# Patient Record
Sex: Female | Born: 1975
Health system: Southern US, Community
[De-identification: ages and names within clinical notes are randomized; demographics above are authoritative.]

## PROBLEM LIST (undated history)

## (undated) DIAGNOSIS — H547 Unspecified visual loss: Secondary | ICD-10-CM

## (undated) DIAGNOSIS — D649 Anemia, unspecified: Secondary | ICD-10-CM

## (undated) DIAGNOSIS — I739 Peripheral vascular disease, unspecified: Secondary | ICD-10-CM

## (undated) DIAGNOSIS — Z89432 Acquired absence of left foot: Secondary | ICD-10-CM

## (undated) DIAGNOSIS — I10 Essential (primary) hypertension: Secondary | ICD-10-CM

## (undated) DIAGNOSIS — Z720 Tobacco use: Secondary | ICD-10-CM

## (undated) DIAGNOSIS — L97509 Non-pressure chronic ulcer of other part of unspecified foot with unspecified severity: Secondary | ICD-10-CM

## (undated) DIAGNOSIS — N186 End stage renal disease: Secondary | ICD-10-CM

## (undated) DIAGNOSIS — Z89431 Acquired absence of right foot: Secondary | ICD-10-CM

## (undated) HISTORY — PX: OTHER SURGICAL HISTORY: SHX169

## (undated) HISTORY — PX: ABDOMINAL HYSTERECTOMY: SHX81

---

## 2016-12-28 DIAGNOSIS — N186 End stage renal disease: Secondary | ICD-10-CM | POA: Diagnosis not present

## 2016-12-28 DIAGNOSIS — N2581 Secondary hyperparathyroidism of renal origin: Secondary | ICD-10-CM | POA: Diagnosis not present

## 2016-12-28 DIAGNOSIS — D631 Anemia in chronic kidney disease: Secondary | ICD-10-CM | POA: Diagnosis not present

## 2017-01-02 DIAGNOSIS — N2581 Secondary hyperparathyroidism of renal origin: Secondary | ICD-10-CM | POA: Diagnosis not present

## 2017-01-02 DIAGNOSIS — D631 Anemia in chronic kidney disease: Secondary | ICD-10-CM | POA: Diagnosis not present

## 2017-01-02 DIAGNOSIS — N186 End stage renal disease: Secondary | ICD-10-CM | POA: Diagnosis not present

## 2017-01-04 DIAGNOSIS — D631 Anemia in chronic kidney disease: Secondary | ICD-10-CM | POA: Diagnosis not present

## 2017-01-04 DIAGNOSIS — N186 End stage renal disease: Secondary | ICD-10-CM | POA: Diagnosis not present

## 2017-01-04 DIAGNOSIS — N2581 Secondary hyperparathyroidism of renal origin: Secondary | ICD-10-CM | POA: Diagnosis not present

## 2017-01-06 DIAGNOSIS — D631 Anemia in chronic kidney disease: Secondary | ICD-10-CM | POA: Diagnosis not present

## 2017-01-06 DIAGNOSIS — N2581 Secondary hyperparathyroidism of renal origin: Secondary | ICD-10-CM | POA: Diagnosis not present

## 2017-01-06 DIAGNOSIS — N186 End stage renal disease: Secondary | ICD-10-CM | POA: Diagnosis not present

## 2017-01-09 DIAGNOSIS — N186 End stage renal disease: Secondary | ICD-10-CM | POA: Diagnosis not present

## 2017-01-09 DIAGNOSIS — D631 Anemia in chronic kidney disease: Secondary | ICD-10-CM | POA: Diagnosis not present

## 2017-01-09 DIAGNOSIS — N2581 Secondary hyperparathyroidism of renal origin: Secondary | ICD-10-CM | POA: Diagnosis not present

## 2017-01-13 DIAGNOSIS — N2581 Secondary hyperparathyroidism of renal origin: Secondary | ICD-10-CM | POA: Diagnosis not present

## 2017-01-13 DIAGNOSIS — D631 Anemia in chronic kidney disease: Secondary | ICD-10-CM | POA: Diagnosis not present

## 2017-01-13 DIAGNOSIS — N186 End stage renal disease: Secondary | ICD-10-CM | POA: Diagnosis not present

## 2017-01-16 DIAGNOSIS — N186 End stage renal disease: Secondary | ICD-10-CM | POA: Diagnosis not present

## 2017-01-16 DIAGNOSIS — N2581 Secondary hyperparathyroidism of renal origin: Secondary | ICD-10-CM | POA: Diagnosis not present

## 2017-01-16 DIAGNOSIS — D631 Anemia in chronic kidney disease: Secondary | ICD-10-CM | POA: Diagnosis not present

## 2017-01-18 DIAGNOSIS — D631 Anemia in chronic kidney disease: Secondary | ICD-10-CM | POA: Diagnosis not present

## 2017-01-18 DIAGNOSIS — N186 End stage renal disease: Secondary | ICD-10-CM | POA: Diagnosis not present

## 2017-01-18 DIAGNOSIS — N2581 Secondary hyperparathyroidism of renal origin: Secondary | ICD-10-CM | POA: Diagnosis not present

## 2017-01-20 DIAGNOSIS — N2581 Secondary hyperparathyroidism of renal origin: Secondary | ICD-10-CM | POA: Diagnosis not present

## 2017-01-20 DIAGNOSIS — N186 End stage renal disease: Secondary | ICD-10-CM | POA: Diagnosis not present

## 2017-01-20 DIAGNOSIS — D631 Anemia in chronic kidney disease: Secondary | ICD-10-CM | POA: Diagnosis not present

## 2017-01-23 DIAGNOSIS — N186 End stage renal disease: Secondary | ICD-10-CM | POA: Diagnosis not present

## 2017-01-23 DIAGNOSIS — D631 Anemia in chronic kidney disease: Secondary | ICD-10-CM | POA: Diagnosis not present

## 2017-01-23 DIAGNOSIS — N2581 Secondary hyperparathyroidism of renal origin: Secondary | ICD-10-CM | POA: Diagnosis not present

## 2017-01-25 DIAGNOSIS — Z992 Dependence on renal dialysis: Secondary | ICD-10-CM | POA: Diagnosis not present

## 2017-01-25 DIAGNOSIS — N2581 Secondary hyperparathyroidism of renal origin: Secondary | ICD-10-CM | POA: Diagnosis not present

## 2017-01-25 DIAGNOSIS — D631 Anemia in chronic kidney disease: Secondary | ICD-10-CM | POA: Diagnosis not present

## 2017-01-25 DIAGNOSIS — N186 End stage renal disease: Secondary | ICD-10-CM | POA: Diagnosis not present

## 2017-01-30 DIAGNOSIS — N2581 Secondary hyperparathyroidism of renal origin: Secondary | ICD-10-CM | POA: Diagnosis not present

## 2017-01-30 DIAGNOSIS — D631 Anemia in chronic kidney disease: Secondary | ICD-10-CM | POA: Diagnosis not present

## 2017-01-30 DIAGNOSIS — N186 End stage renal disease: Secondary | ICD-10-CM | POA: Diagnosis not present

## 2017-02-01 DIAGNOSIS — D631 Anemia in chronic kidney disease: Secondary | ICD-10-CM | POA: Diagnosis not present

## 2017-02-01 DIAGNOSIS — N186 End stage renal disease: Secondary | ICD-10-CM | POA: Diagnosis not present

## 2017-02-01 DIAGNOSIS — N2581 Secondary hyperparathyroidism of renal origin: Secondary | ICD-10-CM | POA: Diagnosis not present

## 2017-02-03 DIAGNOSIS — N186 End stage renal disease: Secondary | ICD-10-CM | POA: Diagnosis not present

## 2017-02-03 DIAGNOSIS — D631 Anemia in chronic kidney disease: Secondary | ICD-10-CM | POA: Diagnosis not present

## 2017-02-03 DIAGNOSIS — N2581 Secondary hyperparathyroidism of renal origin: Secondary | ICD-10-CM | POA: Diagnosis not present

## 2017-02-06 DIAGNOSIS — N2581 Secondary hyperparathyroidism of renal origin: Secondary | ICD-10-CM | POA: Diagnosis not present

## 2017-02-06 DIAGNOSIS — D631 Anemia in chronic kidney disease: Secondary | ICD-10-CM | POA: Diagnosis not present

## 2017-02-06 DIAGNOSIS — N186 End stage renal disease: Secondary | ICD-10-CM | POA: Diagnosis not present

## 2017-02-08 DIAGNOSIS — N186 End stage renal disease: Secondary | ICD-10-CM | POA: Diagnosis not present

## 2017-02-08 DIAGNOSIS — N2581 Secondary hyperparathyroidism of renal origin: Secondary | ICD-10-CM | POA: Diagnosis not present

## 2017-02-08 DIAGNOSIS — D631 Anemia in chronic kidney disease: Secondary | ICD-10-CM | POA: Diagnosis not present

## 2017-02-10 DIAGNOSIS — N186 End stage renal disease: Secondary | ICD-10-CM | POA: Diagnosis not present

## 2017-02-10 DIAGNOSIS — N2581 Secondary hyperparathyroidism of renal origin: Secondary | ICD-10-CM | POA: Diagnosis not present

## 2017-02-10 DIAGNOSIS — D631 Anemia in chronic kidney disease: Secondary | ICD-10-CM | POA: Diagnosis not present

## 2017-02-13 DIAGNOSIS — D631 Anemia in chronic kidney disease: Secondary | ICD-10-CM | POA: Diagnosis not present

## 2017-02-13 DIAGNOSIS — N2581 Secondary hyperparathyroidism of renal origin: Secondary | ICD-10-CM | POA: Diagnosis not present

## 2017-02-13 DIAGNOSIS — N186 End stage renal disease: Secondary | ICD-10-CM | POA: Diagnosis not present

## 2017-02-15 DIAGNOSIS — N186 End stage renal disease: Secondary | ICD-10-CM | POA: Diagnosis not present

## 2017-02-15 DIAGNOSIS — D631 Anemia in chronic kidney disease: Secondary | ICD-10-CM | POA: Diagnosis not present

## 2017-02-15 DIAGNOSIS — N2581 Secondary hyperparathyroidism of renal origin: Secondary | ICD-10-CM | POA: Diagnosis not present

## 2017-02-17 DIAGNOSIS — N2581 Secondary hyperparathyroidism of renal origin: Secondary | ICD-10-CM | POA: Diagnosis not present

## 2017-02-17 DIAGNOSIS — N186 End stage renal disease: Secondary | ICD-10-CM | POA: Diagnosis not present

## 2017-02-17 DIAGNOSIS — D631 Anemia in chronic kidney disease: Secondary | ICD-10-CM | POA: Diagnosis not present

## 2017-02-20 DIAGNOSIS — D631 Anemia in chronic kidney disease: Secondary | ICD-10-CM | POA: Diagnosis not present

## 2017-02-20 DIAGNOSIS — N186 End stage renal disease: Secondary | ICD-10-CM | POA: Diagnosis not present

## 2017-02-20 DIAGNOSIS — N2581 Secondary hyperparathyroidism of renal origin: Secondary | ICD-10-CM | POA: Diagnosis not present

## 2017-02-22 DIAGNOSIS — N186 End stage renal disease: Secondary | ICD-10-CM | POA: Diagnosis not present

## 2017-02-22 DIAGNOSIS — N2581 Secondary hyperparathyroidism of renal origin: Secondary | ICD-10-CM | POA: Diagnosis not present

## 2017-02-22 DIAGNOSIS — D631 Anemia in chronic kidney disease: Secondary | ICD-10-CM | POA: Diagnosis not present

## 2017-02-22 DIAGNOSIS — Z992 Dependence on renal dialysis: Secondary | ICD-10-CM | POA: Diagnosis not present

## 2017-02-24 DIAGNOSIS — N2581 Secondary hyperparathyroidism of renal origin: Secondary | ICD-10-CM | POA: Diagnosis not present

## 2017-02-24 DIAGNOSIS — D631 Anemia in chronic kidney disease: Secondary | ICD-10-CM | POA: Diagnosis not present

## 2017-02-24 DIAGNOSIS — N186 End stage renal disease: Secondary | ICD-10-CM | POA: Diagnosis not present

## 2017-02-24 DIAGNOSIS — D508 Other iron deficiency anemias: Secondary | ICD-10-CM | POA: Diagnosis not present

## 2017-02-24 DIAGNOSIS — E873 Alkalosis: Secondary | ICD-10-CM | POA: Diagnosis not present

## 2017-03-01 DIAGNOSIS — D631 Anemia in chronic kidney disease: Secondary | ICD-10-CM | POA: Diagnosis not present

## 2017-03-01 DIAGNOSIS — N2581 Secondary hyperparathyroidism of renal origin: Secondary | ICD-10-CM | POA: Diagnosis not present

## 2017-03-01 DIAGNOSIS — E873 Alkalosis: Secondary | ICD-10-CM | POA: Diagnosis not present

## 2017-03-01 DIAGNOSIS — N186 End stage renal disease: Secondary | ICD-10-CM | POA: Diagnosis not present

## 2017-03-01 DIAGNOSIS — D508 Other iron deficiency anemias: Secondary | ICD-10-CM | POA: Diagnosis not present

## 2017-03-03 DIAGNOSIS — D508 Other iron deficiency anemias: Secondary | ICD-10-CM | POA: Diagnosis not present

## 2017-03-03 DIAGNOSIS — D631 Anemia in chronic kidney disease: Secondary | ICD-10-CM | POA: Diagnosis not present

## 2017-03-03 DIAGNOSIS — N186 End stage renal disease: Secondary | ICD-10-CM | POA: Diagnosis not present

## 2017-03-03 DIAGNOSIS — N2581 Secondary hyperparathyroidism of renal origin: Secondary | ICD-10-CM | POA: Diagnosis not present

## 2017-03-03 DIAGNOSIS — E873 Alkalosis: Secondary | ICD-10-CM | POA: Diagnosis not present

## 2017-03-06 DIAGNOSIS — N2581 Secondary hyperparathyroidism of renal origin: Secondary | ICD-10-CM | POA: Diagnosis not present

## 2017-03-06 DIAGNOSIS — D508 Other iron deficiency anemias: Secondary | ICD-10-CM | POA: Diagnosis not present

## 2017-03-06 DIAGNOSIS — D631 Anemia in chronic kidney disease: Secondary | ICD-10-CM | POA: Diagnosis not present

## 2017-03-06 DIAGNOSIS — E873 Alkalosis: Secondary | ICD-10-CM | POA: Diagnosis not present

## 2017-03-06 DIAGNOSIS — N186 End stage renal disease: Secondary | ICD-10-CM | POA: Diagnosis not present

## 2017-03-08 DIAGNOSIS — N186 End stage renal disease: Secondary | ICD-10-CM | POA: Diagnosis not present

## 2017-03-08 DIAGNOSIS — D631 Anemia in chronic kidney disease: Secondary | ICD-10-CM | POA: Diagnosis not present

## 2017-03-08 DIAGNOSIS — E873 Alkalosis: Secondary | ICD-10-CM | POA: Diagnosis not present

## 2017-03-08 DIAGNOSIS — N2581 Secondary hyperparathyroidism of renal origin: Secondary | ICD-10-CM | POA: Diagnosis not present

## 2017-03-08 DIAGNOSIS — D508 Other iron deficiency anemias: Secondary | ICD-10-CM | POA: Diagnosis not present

## 2017-03-10 DIAGNOSIS — N186 End stage renal disease: Secondary | ICD-10-CM | POA: Diagnosis not present

## 2017-03-10 DIAGNOSIS — N2581 Secondary hyperparathyroidism of renal origin: Secondary | ICD-10-CM | POA: Diagnosis not present

## 2017-03-10 DIAGNOSIS — D508 Other iron deficiency anemias: Secondary | ICD-10-CM | POA: Diagnosis not present

## 2017-03-10 DIAGNOSIS — D631 Anemia in chronic kidney disease: Secondary | ICD-10-CM | POA: Diagnosis not present

## 2017-03-10 DIAGNOSIS — E873 Alkalosis: Secondary | ICD-10-CM | POA: Diagnosis not present

## 2017-03-13 DIAGNOSIS — N2581 Secondary hyperparathyroidism of renal origin: Secondary | ICD-10-CM | POA: Diagnosis not present

## 2017-03-13 DIAGNOSIS — D631 Anemia in chronic kidney disease: Secondary | ICD-10-CM | POA: Diagnosis not present

## 2017-03-13 DIAGNOSIS — E873 Alkalosis: Secondary | ICD-10-CM | POA: Diagnosis not present

## 2017-03-13 DIAGNOSIS — D508 Other iron deficiency anemias: Secondary | ICD-10-CM | POA: Diagnosis not present

## 2017-03-13 DIAGNOSIS — N186 End stage renal disease: Secondary | ICD-10-CM | POA: Diagnosis not present

## 2017-03-15 DIAGNOSIS — N186 End stage renal disease: Secondary | ICD-10-CM | POA: Diagnosis not present

## 2017-03-15 DIAGNOSIS — N2581 Secondary hyperparathyroidism of renal origin: Secondary | ICD-10-CM | POA: Diagnosis not present

## 2017-03-15 DIAGNOSIS — D508 Other iron deficiency anemias: Secondary | ICD-10-CM | POA: Diagnosis not present

## 2017-03-15 DIAGNOSIS — D631 Anemia in chronic kidney disease: Secondary | ICD-10-CM | POA: Diagnosis not present

## 2017-03-15 DIAGNOSIS — E873 Alkalosis: Secondary | ICD-10-CM | POA: Diagnosis not present

## 2017-03-17 DIAGNOSIS — N2581 Secondary hyperparathyroidism of renal origin: Secondary | ICD-10-CM | POA: Diagnosis not present

## 2017-03-17 DIAGNOSIS — D508 Other iron deficiency anemias: Secondary | ICD-10-CM | POA: Diagnosis not present

## 2017-03-17 DIAGNOSIS — D631 Anemia in chronic kidney disease: Secondary | ICD-10-CM | POA: Diagnosis not present

## 2017-03-17 DIAGNOSIS — E873 Alkalosis: Secondary | ICD-10-CM | POA: Diagnosis not present

## 2017-03-17 DIAGNOSIS — N186 End stage renal disease: Secondary | ICD-10-CM | POA: Diagnosis not present

## 2017-03-20 DIAGNOSIS — D508 Other iron deficiency anemias: Secondary | ICD-10-CM | POA: Diagnosis not present

## 2017-03-20 DIAGNOSIS — D631 Anemia in chronic kidney disease: Secondary | ICD-10-CM | POA: Diagnosis not present

## 2017-03-20 DIAGNOSIS — N186 End stage renal disease: Secondary | ICD-10-CM | POA: Diagnosis not present

## 2017-03-20 DIAGNOSIS — N2581 Secondary hyperparathyroidism of renal origin: Secondary | ICD-10-CM | POA: Diagnosis not present

## 2017-03-20 DIAGNOSIS — E873 Alkalosis: Secondary | ICD-10-CM | POA: Diagnosis not present

## 2017-03-22 DIAGNOSIS — N186 End stage renal disease: Secondary | ICD-10-CM | POA: Diagnosis not present

## 2017-03-22 DIAGNOSIS — N2581 Secondary hyperparathyroidism of renal origin: Secondary | ICD-10-CM | POA: Diagnosis not present

## 2017-03-22 DIAGNOSIS — D631 Anemia in chronic kidney disease: Secondary | ICD-10-CM | POA: Diagnosis not present

## 2017-03-22 DIAGNOSIS — D508 Other iron deficiency anemias: Secondary | ICD-10-CM | POA: Diagnosis not present

## 2017-03-22 DIAGNOSIS — E873 Alkalosis: Secondary | ICD-10-CM | POA: Diagnosis not present

## 2017-03-24 DIAGNOSIS — N2581 Secondary hyperparathyroidism of renal origin: Secondary | ICD-10-CM | POA: Diagnosis not present

## 2017-03-24 DIAGNOSIS — N186 End stage renal disease: Secondary | ICD-10-CM | POA: Diagnosis not present

## 2017-03-24 DIAGNOSIS — D508 Other iron deficiency anemias: Secondary | ICD-10-CM | POA: Diagnosis not present

## 2017-03-24 DIAGNOSIS — E873 Alkalosis: Secondary | ICD-10-CM | POA: Diagnosis not present

## 2017-03-24 DIAGNOSIS — D631 Anemia in chronic kidney disease: Secondary | ICD-10-CM | POA: Diagnosis not present

## 2017-03-25 DIAGNOSIS — N186 End stage renal disease: Secondary | ICD-10-CM | POA: Diagnosis not present

## 2017-03-25 DIAGNOSIS — Z992 Dependence on renal dialysis: Secondary | ICD-10-CM | POA: Diagnosis not present

## 2017-03-27 DIAGNOSIS — N186 End stage renal disease: Secondary | ICD-10-CM | POA: Diagnosis not present

## 2017-03-27 DIAGNOSIS — Z23 Encounter for immunization: Secondary | ICD-10-CM | POA: Diagnosis not present

## 2017-03-27 DIAGNOSIS — D508 Other iron deficiency anemias: Secondary | ICD-10-CM | POA: Diagnosis not present

## 2017-03-27 DIAGNOSIS — N2581 Secondary hyperparathyroidism of renal origin: Secondary | ICD-10-CM | POA: Diagnosis not present

## 2017-03-27 DIAGNOSIS — D631 Anemia in chronic kidney disease: Secondary | ICD-10-CM | POA: Diagnosis not present

## 2017-03-31 DIAGNOSIS — N186 End stage renal disease: Secondary | ICD-10-CM | POA: Diagnosis not present

## 2017-03-31 DIAGNOSIS — N2581 Secondary hyperparathyroidism of renal origin: Secondary | ICD-10-CM | POA: Diagnosis not present

## 2017-03-31 DIAGNOSIS — Z23 Encounter for immunization: Secondary | ICD-10-CM | POA: Diagnosis not present

## 2017-03-31 DIAGNOSIS — D631 Anemia in chronic kidney disease: Secondary | ICD-10-CM | POA: Diagnosis not present

## 2017-03-31 DIAGNOSIS — D508 Other iron deficiency anemias: Secondary | ICD-10-CM | POA: Diagnosis not present

## 2017-04-03 DIAGNOSIS — D631 Anemia in chronic kidney disease: Secondary | ICD-10-CM | POA: Diagnosis not present

## 2017-04-03 DIAGNOSIS — N2581 Secondary hyperparathyroidism of renal origin: Secondary | ICD-10-CM | POA: Diagnosis not present

## 2017-04-03 DIAGNOSIS — D508 Other iron deficiency anemias: Secondary | ICD-10-CM | POA: Diagnosis not present

## 2017-04-03 DIAGNOSIS — Z23 Encounter for immunization: Secondary | ICD-10-CM | POA: Diagnosis not present

## 2017-04-03 DIAGNOSIS — N186 End stage renal disease: Secondary | ICD-10-CM | POA: Diagnosis not present

## 2017-04-05 DIAGNOSIS — N186 End stage renal disease: Secondary | ICD-10-CM | POA: Diagnosis not present

## 2017-04-05 DIAGNOSIS — D508 Other iron deficiency anemias: Secondary | ICD-10-CM | POA: Diagnosis not present

## 2017-04-05 DIAGNOSIS — N2581 Secondary hyperparathyroidism of renal origin: Secondary | ICD-10-CM | POA: Diagnosis not present

## 2017-04-05 DIAGNOSIS — D631 Anemia in chronic kidney disease: Secondary | ICD-10-CM | POA: Diagnosis not present

## 2017-04-05 DIAGNOSIS — Z23 Encounter for immunization: Secondary | ICD-10-CM | POA: Diagnosis not present

## 2017-04-07 DIAGNOSIS — N2581 Secondary hyperparathyroidism of renal origin: Secondary | ICD-10-CM | POA: Diagnosis not present

## 2017-04-07 DIAGNOSIS — N186 End stage renal disease: Secondary | ICD-10-CM | POA: Diagnosis not present

## 2017-04-07 DIAGNOSIS — D631 Anemia in chronic kidney disease: Secondary | ICD-10-CM | POA: Diagnosis not present

## 2017-04-07 DIAGNOSIS — Z23 Encounter for immunization: Secondary | ICD-10-CM | POA: Diagnosis not present

## 2017-04-07 DIAGNOSIS — D508 Other iron deficiency anemias: Secondary | ICD-10-CM | POA: Diagnosis not present

## 2017-04-10 DIAGNOSIS — D508 Other iron deficiency anemias: Secondary | ICD-10-CM | POA: Diagnosis not present

## 2017-04-10 DIAGNOSIS — D631 Anemia in chronic kidney disease: Secondary | ICD-10-CM | POA: Diagnosis not present

## 2017-04-10 DIAGNOSIS — N2581 Secondary hyperparathyroidism of renal origin: Secondary | ICD-10-CM | POA: Diagnosis not present

## 2017-04-10 DIAGNOSIS — N186 End stage renal disease: Secondary | ICD-10-CM | POA: Diagnosis not present

## 2017-04-10 DIAGNOSIS — Z23 Encounter for immunization: Secondary | ICD-10-CM | POA: Diagnosis not present

## 2017-04-12 DIAGNOSIS — D508 Other iron deficiency anemias: Secondary | ICD-10-CM | POA: Diagnosis not present

## 2017-04-12 DIAGNOSIS — N2581 Secondary hyperparathyroidism of renal origin: Secondary | ICD-10-CM | POA: Diagnosis not present

## 2017-04-12 DIAGNOSIS — D631 Anemia in chronic kidney disease: Secondary | ICD-10-CM | POA: Diagnosis not present

## 2017-04-12 DIAGNOSIS — Z23 Encounter for immunization: Secondary | ICD-10-CM | POA: Diagnosis not present

## 2017-04-12 DIAGNOSIS — N186 End stage renal disease: Secondary | ICD-10-CM | POA: Diagnosis not present

## 2017-04-14 DIAGNOSIS — D508 Other iron deficiency anemias: Secondary | ICD-10-CM | POA: Diagnosis not present

## 2017-04-14 DIAGNOSIS — N186 End stage renal disease: Secondary | ICD-10-CM | POA: Diagnosis not present

## 2017-04-14 DIAGNOSIS — Z23 Encounter for immunization: Secondary | ICD-10-CM | POA: Diagnosis not present

## 2017-04-14 DIAGNOSIS — D631 Anemia in chronic kidney disease: Secondary | ICD-10-CM | POA: Diagnosis not present

## 2017-04-14 DIAGNOSIS — N2581 Secondary hyperparathyroidism of renal origin: Secondary | ICD-10-CM | POA: Diagnosis not present

## 2017-04-17 DIAGNOSIS — Z23 Encounter for immunization: Secondary | ICD-10-CM | POA: Diagnosis not present

## 2017-04-17 DIAGNOSIS — D631 Anemia in chronic kidney disease: Secondary | ICD-10-CM | POA: Diagnosis not present

## 2017-04-17 DIAGNOSIS — N186 End stage renal disease: Secondary | ICD-10-CM | POA: Diagnosis not present

## 2017-04-17 DIAGNOSIS — D508 Other iron deficiency anemias: Secondary | ICD-10-CM | POA: Diagnosis not present

## 2017-04-17 DIAGNOSIS — N2581 Secondary hyperparathyroidism of renal origin: Secondary | ICD-10-CM | POA: Diagnosis not present

## 2017-04-19 DIAGNOSIS — D631 Anemia in chronic kidney disease: Secondary | ICD-10-CM | POA: Diagnosis not present

## 2017-04-19 DIAGNOSIS — N2581 Secondary hyperparathyroidism of renal origin: Secondary | ICD-10-CM | POA: Diagnosis not present

## 2017-04-19 DIAGNOSIS — Z23 Encounter for immunization: Secondary | ICD-10-CM | POA: Diagnosis not present

## 2017-04-19 DIAGNOSIS — N186 End stage renal disease: Secondary | ICD-10-CM | POA: Diagnosis not present

## 2017-04-19 DIAGNOSIS — D508 Other iron deficiency anemias: Secondary | ICD-10-CM | POA: Diagnosis not present

## 2017-04-21 DIAGNOSIS — Z23 Encounter for immunization: Secondary | ICD-10-CM | POA: Diagnosis not present

## 2017-04-21 DIAGNOSIS — N2581 Secondary hyperparathyroidism of renal origin: Secondary | ICD-10-CM | POA: Diagnosis not present

## 2017-04-21 DIAGNOSIS — N186 End stage renal disease: Secondary | ICD-10-CM | POA: Diagnosis not present

## 2017-04-21 DIAGNOSIS — D631 Anemia in chronic kidney disease: Secondary | ICD-10-CM | POA: Diagnosis not present

## 2017-04-21 DIAGNOSIS — D508 Other iron deficiency anemias: Secondary | ICD-10-CM | POA: Diagnosis not present

## 2017-04-24 DIAGNOSIS — Z23 Encounter for immunization: Secondary | ICD-10-CM | POA: Diagnosis not present

## 2017-04-24 DIAGNOSIS — N186 End stage renal disease: Secondary | ICD-10-CM | POA: Diagnosis not present

## 2017-04-24 DIAGNOSIS — Z992 Dependence on renal dialysis: Secondary | ICD-10-CM | POA: Diagnosis not present

## 2017-04-24 DIAGNOSIS — N2581 Secondary hyperparathyroidism of renal origin: Secondary | ICD-10-CM | POA: Diagnosis not present

## 2017-04-24 DIAGNOSIS — D508 Other iron deficiency anemias: Secondary | ICD-10-CM | POA: Diagnosis not present

## 2017-04-24 DIAGNOSIS — D631 Anemia in chronic kidney disease: Secondary | ICD-10-CM | POA: Diagnosis not present

## 2017-04-26 DIAGNOSIS — D508 Other iron deficiency anemias: Secondary | ICD-10-CM | POA: Diagnosis not present

## 2017-04-26 DIAGNOSIS — N186 End stage renal disease: Secondary | ICD-10-CM | POA: Diagnosis not present

## 2017-04-26 DIAGNOSIS — N2581 Secondary hyperparathyroidism of renal origin: Secondary | ICD-10-CM | POA: Diagnosis not present

## 2017-04-28 DIAGNOSIS — N186 End stage renal disease: Secondary | ICD-10-CM | POA: Diagnosis not present

## 2017-04-28 DIAGNOSIS — D508 Other iron deficiency anemias: Secondary | ICD-10-CM | POA: Diagnosis not present

## 2017-04-28 DIAGNOSIS — N2581 Secondary hyperparathyroidism of renal origin: Secondary | ICD-10-CM | POA: Diagnosis not present

## 2017-05-01 DIAGNOSIS — N186 End stage renal disease: Secondary | ICD-10-CM | POA: Diagnosis not present

## 2017-05-01 DIAGNOSIS — N2581 Secondary hyperparathyroidism of renal origin: Secondary | ICD-10-CM | POA: Diagnosis not present

## 2017-05-01 DIAGNOSIS — D508 Other iron deficiency anemias: Secondary | ICD-10-CM | POA: Diagnosis not present

## 2017-05-03 DIAGNOSIS — N186 End stage renal disease: Secondary | ICD-10-CM | POA: Diagnosis not present

## 2017-05-03 DIAGNOSIS — D508 Other iron deficiency anemias: Secondary | ICD-10-CM | POA: Diagnosis not present

## 2017-05-03 DIAGNOSIS — N2581 Secondary hyperparathyroidism of renal origin: Secondary | ICD-10-CM | POA: Diagnosis not present

## 2017-05-05 DIAGNOSIS — D508 Other iron deficiency anemias: Secondary | ICD-10-CM | POA: Diagnosis not present

## 2017-05-05 DIAGNOSIS — N186 End stage renal disease: Secondary | ICD-10-CM | POA: Diagnosis not present

## 2017-05-05 DIAGNOSIS — N2581 Secondary hyperparathyroidism of renal origin: Secondary | ICD-10-CM | POA: Diagnosis not present

## 2017-05-08 DIAGNOSIS — N2581 Secondary hyperparathyroidism of renal origin: Secondary | ICD-10-CM | POA: Diagnosis not present

## 2017-05-08 DIAGNOSIS — N186 End stage renal disease: Secondary | ICD-10-CM | POA: Diagnosis not present

## 2017-05-08 DIAGNOSIS — D508 Other iron deficiency anemias: Secondary | ICD-10-CM | POA: Diagnosis not present

## 2017-05-10 DIAGNOSIS — N2581 Secondary hyperparathyroidism of renal origin: Secondary | ICD-10-CM | POA: Diagnosis not present

## 2017-05-10 DIAGNOSIS — N186 End stage renal disease: Secondary | ICD-10-CM | POA: Diagnosis not present

## 2017-05-10 DIAGNOSIS — D508 Other iron deficiency anemias: Secondary | ICD-10-CM | POA: Diagnosis not present

## 2017-05-12 DIAGNOSIS — N186 End stage renal disease: Secondary | ICD-10-CM | POA: Diagnosis not present

## 2017-05-12 DIAGNOSIS — N2581 Secondary hyperparathyroidism of renal origin: Secondary | ICD-10-CM | POA: Diagnosis not present

## 2017-05-12 DIAGNOSIS — D508 Other iron deficiency anemias: Secondary | ICD-10-CM | POA: Diagnosis not present

## 2017-05-15 DIAGNOSIS — D508 Other iron deficiency anemias: Secondary | ICD-10-CM | POA: Diagnosis not present

## 2017-05-15 DIAGNOSIS — N186 End stage renal disease: Secondary | ICD-10-CM | POA: Diagnosis not present

## 2017-05-15 DIAGNOSIS — N2581 Secondary hyperparathyroidism of renal origin: Secondary | ICD-10-CM | POA: Diagnosis not present

## 2017-05-17 DIAGNOSIS — D508 Other iron deficiency anemias: Secondary | ICD-10-CM | POA: Diagnosis not present

## 2017-05-17 DIAGNOSIS — N186 End stage renal disease: Secondary | ICD-10-CM | POA: Diagnosis not present

## 2017-05-17 DIAGNOSIS — N2581 Secondary hyperparathyroidism of renal origin: Secondary | ICD-10-CM | POA: Diagnosis not present

## 2017-05-19 DIAGNOSIS — N186 End stage renal disease: Secondary | ICD-10-CM | POA: Diagnosis not present

## 2017-05-19 DIAGNOSIS — N2581 Secondary hyperparathyroidism of renal origin: Secondary | ICD-10-CM | POA: Diagnosis not present

## 2017-05-19 DIAGNOSIS — D508 Other iron deficiency anemias: Secondary | ICD-10-CM | POA: Diagnosis not present

## 2017-05-22 DIAGNOSIS — N186 End stage renal disease: Secondary | ICD-10-CM | POA: Diagnosis not present

## 2017-05-22 DIAGNOSIS — D508 Other iron deficiency anemias: Secondary | ICD-10-CM | POA: Diagnosis not present

## 2017-05-22 DIAGNOSIS — N2581 Secondary hyperparathyroidism of renal origin: Secondary | ICD-10-CM | POA: Diagnosis not present

## 2017-05-24 DIAGNOSIS — N2581 Secondary hyperparathyroidism of renal origin: Secondary | ICD-10-CM | POA: Diagnosis not present

## 2017-05-24 DIAGNOSIS — N186 End stage renal disease: Secondary | ICD-10-CM | POA: Diagnosis not present

## 2017-05-24 DIAGNOSIS — D508 Other iron deficiency anemias: Secondary | ICD-10-CM | POA: Diagnosis not present

## 2017-05-25 DIAGNOSIS — N186 End stage renal disease: Secondary | ICD-10-CM | POA: Diagnosis not present

## 2017-05-25 DIAGNOSIS — Z992 Dependence on renal dialysis: Secondary | ICD-10-CM | POA: Diagnosis not present

## 2017-05-26 DIAGNOSIS — N186 End stage renal disease: Secondary | ICD-10-CM | POA: Diagnosis not present

## 2017-05-26 DIAGNOSIS — D631 Anemia in chronic kidney disease: Secondary | ICD-10-CM | POA: Diagnosis not present

## 2017-05-26 DIAGNOSIS — N2581 Secondary hyperparathyroidism of renal origin: Secondary | ICD-10-CM | POA: Diagnosis not present

## 2017-05-29 DIAGNOSIS — N186 End stage renal disease: Secondary | ICD-10-CM | POA: Diagnosis not present

## 2017-05-29 DIAGNOSIS — D631 Anemia in chronic kidney disease: Secondary | ICD-10-CM | POA: Diagnosis not present

## 2017-05-29 DIAGNOSIS — N2581 Secondary hyperparathyroidism of renal origin: Secondary | ICD-10-CM | POA: Diagnosis not present

## 2017-05-31 DIAGNOSIS — N186 End stage renal disease: Secondary | ICD-10-CM | POA: Diagnosis not present

## 2017-05-31 DIAGNOSIS — D631 Anemia in chronic kidney disease: Secondary | ICD-10-CM | POA: Diagnosis not present

## 2017-05-31 DIAGNOSIS — N2581 Secondary hyperparathyroidism of renal origin: Secondary | ICD-10-CM | POA: Diagnosis not present

## 2017-06-02 DIAGNOSIS — N2581 Secondary hyperparathyroidism of renal origin: Secondary | ICD-10-CM | POA: Diagnosis not present

## 2017-06-02 DIAGNOSIS — N186 End stage renal disease: Secondary | ICD-10-CM | POA: Diagnosis not present

## 2017-06-02 DIAGNOSIS — D631 Anemia in chronic kidney disease: Secondary | ICD-10-CM | POA: Diagnosis not present

## 2017-06-05 DIAGNOSIS — D631 Anemia in chronic kidney disease: Secondary | ICD-10-CM | POA: Diagnosis not present

## 2017-06-05 DIAGNOSIS — N2581 Secondary hyperparathyroidism of renal origin: Secondary | ICD-10-CM | POA: Diagnosis not present

## 2017-06-05 DIAGNOSIS — N186 End stage renal disease: Secondary | ICD-10-CM | POA: Diagnosis not present

## 2017-06-07 DIAGNOSIS — N186 End stage renal disease: Secondary | ICD-10-CM | POA: Diagnosis not present

## 2017-06-07 DIAGNOSIS — D631 Anemia in chronic kidney disease: Secondary | ICD-10-CM | POA: Diagnosis not present

## 2017-06-07 DIAGNOSIS — N2581 Secondary hyperparathyroidism of renal origin: Secondary | ICD-10-CM | POA: Diagnosis not present

## 2017-06-09 DIAGNOSIS — N186 End stage renal disease: Secondary | ICD-10-CM | POA: Diagnosis not present

## 2017-06-09 DIAGNOSIS — D631 Anemia in chronic kidney disease: Secondary | ICD-10-CM | POA: Diagnosis not present

## 2017-06-09 DIAGNOSIS — N2581 Secondary hyperparathyroidism of renal origin: Secondary | ICD-10-CM | POA: Diagnosis not present

## 2017-06-12 DIAGNOSIS — N186 End stage renal disease: Secondary | ICD-10-CM | POA: Diagnosis not present

## 2017-06-12 DIAGNOSIS — N2581 Secondary hyperparathyroidism of renal origin: Secondary | ICD-10-CM | POA: Diagnosis not present

## 2017-06-12 DIAGNOSIS — D631 Anemia in chronic kidney disease: Secondary | ICD-10-CM | POA: Diagnosis not present

## 2017-06-14 DIAGNOSIS — D631 Anemia in chronic kidney disease: Secondary | ICD-10-CM | POA: Diagnosis not present

## 2017-06-14 DIAGNOSIS — N186 End stage renal disease: Secondary | ICD-10-CM | POA: Diagnosis not present

## 2017-06-14 DIAGNOSIS — N2581 Secondary hyperparathyroidism of renal origin: Secondary | ICD-10-CM | POA: Diagnosis not present

## 2017-06-16 DIAGNOSIS — N2581 Secondary hyperparathyroidism of renal origin: Secondary | ICD-10-CM | POA: Diagnosis not present

## 2017-06-16 DIAGNOSIS — N186 End stage renal disease: Secondary | ICD-10-CM | POA: Diagnosis not present

## 2017-06-16 DIAGNOSIS — D631 Anemia in chronic kidney disease: Secondary | ICD-10-CM | POA: Diagnosis not present

## 2017-06-19 DIAGNOSIS — N186 End stage renal disease: Secondary | ICD-10-CM | POA: Diagnosis not present

## 2017-06-19 DIAGNOSIS — D631 Anemia in chronic kidney disease: Secondary | ICD-10-CM | POA: Diagnosis not present

## 2017-06-19 DIAGNOSIS — N2581 Secondary hyperparathyroidism of renal origin: Secondary | ICD-10-CM | POA: Diagnosis not present

## 2017-06-21 DIAGNOSIS — N186 End stage renal disease: Secondary | ICD-10-CM | POA: Diagnosis not present

## 2017-06-21 DIAGNOSIS — D631 Anemia in chronic kidney disease: Secondary | ICD-10-CM | POA: Diagnosis not present

## 2017-06-21 DIAGNOSIS — N2581 Secondary hyperparathyroidism of renal origin: Secondary | ICD-10-CM | POA: Diagnosis not present

## 2017-06-23 DIAGNOSIS — D631 Anemia in chronic kidney disease: Secondary | ICD-10-CM | POA: Diagnosis not present

## 2017-06-23 DIAGNOSIS — N2581 Secondary hyperparathyroidism of renal origin: Secondary | ICD-10-CM | POA: Diagnosis not present

## 2017-06-23 DIAGNOSIS — N186 End stage renal disease: Secondary | ICD-10-CM | POA: Diagnosis not present

## 2017-06-24 DIAGNOSIS — N186 End stage renal disease: Secondary | ICD-10-CM | POA: Diagnosis not present

## 2017-06-24 DIAGNOSIS — Z992 Dependence on renal dialysis: Secondary | ICD-10-CM | POA: Diagnosis not present

## 2017-06-26 DIAGNOSIS — N2581 Secondary hyperparathyroidism of renal origin: Secondary | ICD-10-CM | POA: Diagnosis not present

## 2017-06-26 DIAGNOSIS — N186 End stage renal disease: Secondary | ICD-10-CM | POA: Diagnosis not present

## 2017-06-26 DIAGNOSIS — R110 Nausea: Secondary | ICD-10-CM | POA: Diagnosis not present

## 2017-06-28 DIAGNOSIS — R110 Nausea: Secondary | ICD-10-CM | POA: Diagnosis not present

## 2017-06-28 DIAGNOSIS — N186 End stage renal disease: Secondary | ICD-10-CM | POA: Diagnosis not present

## 2017-06-28 DIAGNOSIS — N2581 Secondary hyperparathyroidism of renal origin: Secondary | ICD-10-CM | POA: Diagnosis not present

## 2017-06-30 DIAGNOSIS — N2581 Secondary hyperparathyroidism of renal origin: Secondary | ICD-10-CM | POA: Diagnosis not present

## 2017-06-30 DIAGNOSIS — N186 End stage renal disease: Secondary | ICD-10-CM | POA: Diagnosis not present

## 2017-06-30 DIAGNOSIS — R110 Nausea: Secondary | ICD-10-CM | POA: Diagnosis not present

## 2017-07-03 DIAGNOSIS — N186 End stage renal disease: Secondary | ICD-10-CM | POA: Diagnosis not present

## 2017-07-03 DIAGNOSIS — N2581 Secondary hyperparathyroidism of renal origin: Secondary | ICD-10-CM | POA: Diagnosis not present

## 2017-07-03 DIAGNOSIS — R110 Nausea: Secondary | ICD-10-CM | POA: Diagnosis not present

## 2017-07-05 DIAGNOSIS — N186 End stage renal disease: Secondary | ICD-10-CM | POA: Diagnosis not present

## 2017-07-05 DIAGNOSIS — N2581 Secondary hyperparathyroidism of renal origin: Secondary | ICD-10-CM | POA: Diagnosis not present

## 2017-07-05 DIAGNOSIS — R110 Nausea: Secondary | ICD-10-CM | POA: Diagnosis not present

## 2017-07-07 DIAGNOSIS — R110 Nausea: Secondary | ICD-10-CM | POA: Diagnosis not present

## 2017-07-07 DIAGNOSIS — N186 End stage renal disease: Secondary | ICD-10-CM | POA: Diagnosis not present

## 2017-07-07 DIAGNOSIS — N2581 Secondary hyperparathyroidism of renal origin: Secondary | ICD-10-CM | POA: Diagnosis not present

## 2017-07-10 DIAGNOSIS — N2581 Secondary hyperparathyroidism of renal origin: Secondary | ICD-10-CM | POA: Diagnosis not present

## 2017-07-10 DIAGNOSIS — R110 Nausea: Secondary | ICD-10-CM | POA: Diagnosis not present

## 2017-07-10 DIAGNOSIS — N186 End stage renal disease: Secondary | ICD-10-CM | POA: Diagnosis not present

## 2017-07-12 DIAGNOSIS — N186 End stage renal disease: Secondary | ICD-10-CM | POA: Diagnosis not present

## 2017-07-12 DIAGNOSIS — R110 Nausea: Secondary | ICD-10-CM | POA: Diagnosis not present

## 2017-07-12 DIAGNOSIS — N2581 Secondary hyperparathyroidism of renal origin: Secondary | ICD-10-CM | POA: Diagnosis not present

## 2017-07-14 DIAGNOSIS — N2581 Secondary hyperparathyroidism of renal origin: Secondary | ICD-10-CM | POA: Diagnosis not present

## 2017-07-14 DIAGNOSIS — R110 Nausea: Secondary | ICD-10-CM | POA: Diagnosis not present

## 2017-07-14 DIAGNOSIS — N186 End stage renal disease: Secondary | ICD-10-CM | POA: Diagnosis not present

## 2017-07-17 DIAGNOSIS — N2581 Secondary hyperparathyroidism of renal origin: Secondary | ICD-10-CM | POA: Diagnosis not present

## 2017-07-17 DIAGNOSIS — N186 End stage renal disease: Secondary | ICD-10-CM | POA: Diagnosis not present

## 2017-07-17 DIAGNOSIS — R110 Nausea: Secondary | ICD-10-CM | POA: Diagnosis not present

## 2017-07-19 DIAGNOSIS — R110 Nausea: Secondary | ICD-10-CM | POA: Diagnosis not present

## 2017-07-19 DIAGNOSIS — N2581 Secondary hyperparathyroidism of renal origin: Secondary | ICD-10-CM | POA: Diagnosis not present

## 2017-07-19 DIAGNOSIS — N186 End stage renal disease: Secondary | ICD-10-CM | POA: Diagnosis not present

## 2017-07-20 DIAGNOSIS — N898 Other specified noninflammatory disorders of vagina: Secondary | ICD-10-CM | POA: Diagnosis not present

## 2017-07-20 DIAGNOSIS — R1084 Generalized abdominal pain: Secondary | ICD-10-CM | POA: Diagnosis not present

## 2017-07-21 DIAGNOSIS — N2581 Secondary hyperparathyroidism of renal origin: Secondary | ICD-10-CM | POA: Diagnosis not present

## 2017-07-21 DIAGNOSIS — N186 End stage renal disease: Secondary | ICD-10-CM | POA: Diagnosis not present

## 2017-07-21 DIAGNOSIS — R110 Nausea: Secondary | ICD-10-CM | POA: Diagnosis not present

## 2017-07-24 DIAGNOSIS — R110 Nausea: Secondary | ICD-10-CM | POA: Diagnosis not present

## 2017-07-24 DIAGNOSIS — N186 End stage renal disease: Secondary | ICD-10-CM | POA: Diagnosis not present

## 2017-07-24 DIAGNOSIS — N2581 Secondary hyperparathyroidism of renal origin: Secondary | ICD-10-CM | POA: Diagnosis not present

## 2017-07-25 DIAGNOSIS — N186 End stage renal disease: Secondary | ICD-10-CM | POA: Diagnosis not present

## 2017-07-25 DIAGNOSIS — Z992 Dependence on renal dialysis: Secondary | ICD-10-CM | POA: Diagnosis not present

## 2017-07-26 DIAGNOSIS — N2581 Secondary hyperparathyroidism of renal origin: Secondary | ICD-10-CM | POA: Diagnosis not present

## 2017-07-26 DIAGNOSIS — D631 Anemia in chronic kidney disease: Secondary | ICD-10-CM | POA: Diagnosis not present

## 2017-07-26 DIAGNOSIS — N186 End stage renal disease: Secondary | ICD-10-CM | POA: Diagnosis not present

## 2017-07-28 DIAGNOSIS — N2581 Secondary hyperparathyroidism of renal origin: Secondary | ICD-10-CM | POA: Diagnosis not present

## 2017-07-28 DIAGNOSIS — D631 Anemia in chronic kidney disease: Secondary | ICD-10-CM | POA: Diagnosis not present

## 2017-07-28 DIAGNOSIS — N186 End stage renal disease: Secondary | ICD-10-CM | POA: Diagnosis not present

## 2017-07-31 DIAGNOSIS — N186 End stage renal disease: Secondary | ICD-10-CM | POA: Diagnosis not present

## 2017-07-31 DIAGNOSIS — D631 Anemia in chronic kidney disease: Secondary | ICD-10-CM | POA: Diagnosis not present

## 2017-07-31 DIAGNOSIS — N2581 Secondary hyperparathyroidism of renal origin: Secondary | ICD-10-CM | POA: Diagnosis not present

## 2017-08-02 DIAGNOSIS — D631 Anemia in chronic kidney disease: Secondary | ICD-10-CM | POA: Diagnosis not present

## 2017-08-02 DIAGNOSIS — N186 End stage renal disease: Secondary | ICD-10-CM | POA: Diagnosis not present

## 2017-08-02 DIAGNOSIS — N2581 Secondary hyperparathyroidism of renal origin: Secondary | ICD-10-CM | POA: Diagnosis not present

## 2017-08-04 DIAGNOSIS — D631 Anemia in chronic kidney disease: Secondary | ICD-10-CM | POA: Diagnosis not present

## 2017-08-04 DIAGNOSIS — N186 End stage renal disease: Secondary | ICD-10-CM | POA: Diagnosis not present

## 2017-08-04 DIAGNOSIS — N2581 Secondary hyperparathyroidism of renal origin: Secondary | ICD-10-CM | POA: Diagnosis not present

## 2017-08-07 DIAGNOSIS — N186 End stage renal disease: Secondary | ICD-10-CM | POA: Diagnosis not present

## 2017-08-07 DIAGNOSIS — N2581 Secondary hyperparathyroidism of renal origin: Secondary | ICD-10-CM | POA: Diagnosis not present

## 2017-08-07 DIAGNOSIS — D631 Anemia in chronic kidney disease: Secondary | ICD-10-CM | POA: Diagnosis not present

## 2017-08-09 DIAGNOSIS — N186 End stage renal disease: Secondary | ICD-10-CM | POA: Diagnosis not present

## 2017-08-09 DIAGNOSIS — N2581 Secondary hyperparathyroidism of renal origin: Secondary | ICD-10-CM | POA: Diagnosis not present

## 2017-08-09 DIAGNOSIS — D631 Anemia in chronic kidney disease: Secondary | ICD-10-CM | POA: Diagnosis not present

## 2017-08-11 DIAGNOSIS — N2581 Secondary hyperparathyroidism of renal origin: Secondary | ICD-10-CM | POA: Diagnosis not present

## 2017-08-11 DIAGNOSIS — D631 Anemia in chronic kidney disease: Secondary | ICD-10-CM | POA: Diagnosis not present

## 2017-08-11 DIAGNOSIS — N186 End stage renal disease: Secondary | ICD-10-CM | POA: Diagnosis not present

## 2017-08-14 DIAGNOSIS — N186 End stage renal disease: Secondary | ICD-10-CM | POA: Diagnosis not present

## 2017-08-14 DIAGNOSIS — N2581 Secondary hyperparathyroidism of renal origin: Secondary | ICD-10-CM | POA: Diagnosis not present

## 2017-08-14 DIAGNOSIS — D631 Anemia in chronic kidney disease: Secondary | ICD-10-CM | POA: Diagnosis not present

## 2017-08-16 DIAGNOSIS — N2581 Secondary hyperparathyroidism of renal origin: Secondary | ICD-10-CM | POA: Diagnosis not present

## 2017-08-16 DIAGNOSIS — D631 Anemia in chronic kidney disease: Secondary | ICD-10-CM | POA: Diagnosis not present

## 2017-08-16 DIAGNOSIS — N186 End stage renal disease: Secondary | ICD-10-CM | POA: Diagnosis not present

## 2017-08-18 DIAGNOSIS — D631 Anemia in chronic kidney disease: Secondary | ICD-10-CM | POA: Diagnosis not present

## 2017-08-18 DIAGNOSIS — N186 End stage renal disease: Secondary | ICD-10-CM | POA: Diagnosis not present

## 2017-08-18 DIAGNOSIS — N2581 Secondary hyperparathyroidism of renal origin: Secondary | ICD-10-CM | POA: Diagnosis not present

## 2017-08-21 DIAGNOSIS — N2581 Secondary hyperparathyroidism of renal origin: Secondary | ICD-10-CM | POA: Diagnosis not present

## 2017-08-21 DIAGNOSIS — D631 Anemia in chronic kidney disease: Secondary | ICD-10-CM | POA: Diagnosis not present

## 2017-08-21 DIAGNOSIS — N186 End stage renal disease: Secondary | ICD-10-CM | POA: Diagnosis not present

## 2017-08-23 DIAGNOSIS — N186 End stage renal disease: Secondary | ICD-10-CM | POA: Diagnosis not present

## 2017-08-23 DIAGNOSIS — D631 Anemia in chronic kidney disease: Secondary | ICD-10-CM | POA: Diagnosis not present

## 2017-08-23 DIAGNOSIS — N2581 Secondary hyperparathyroidism of renal origin: Secondary | ICD-10-CM | POA: Diagnosis not present

## 2017-08-25 DIAGNOSIS — N186 End stage renal disease: Secondary | ICD-10-CM | POA: Diagnosis not present

## 2017-08-25 DIAGNOSIS — D631 Anemia in chronic kidney disease: Secondary | ICD-10-CM | POA: Diagnosis not present

## 2017-08-25 DIAGNOSIS — N2581 Secondary hyperparathyroidism of renal origin: Secondary | ICD-10-CM | POA: Diagnosis not present

## 2017-08-25 DIAGNOSIS — Z992 Dependence on renal dialysis: Secondary | ICD-10-CM | POA: Diagnosis not present

## 2017-08-28 DIAGNOSIS — N2581 Secondary hyperparathyroidism of renal origin: Secondary | ICD-10-CM | POA: Diagnosis not present

## 2017-08-28 DIAGNOSIS — N186 End stage renal disease: Secondary | ICD-10-CM | POA: Diagnosis not present

## 2017-08-28 DIAGNOSIS — D631 Anemia in chronic kidney disease: Secondary | ICD-10-CM | POA: Diagnosis not present

## 2017-08-30 DIAGNOSIS — N186 End stage renal disease: Secondary | ICD-10-CM | POA: Diagnosis not present

## 2017-08-30 DIAGNOSIS — N2581 Secondary hyperparathyroidism of renal origin: Secondary | ICD-10-CM | POA: Diagnosis not present

## 2017-08-30 DIAGNOSIS — D631 Anemia in chronic kidney disease: Secondary | ICD-10-CM | POA: Diagnosis not present

## 2017-08-31 DIAGNOSIS — Z01419 Encounter for gynecological examination (general) (routine) without abnormal findings: Secondary | ICD-10-CM | POA: Diagnosis not present

## 2017-08-31 DIAGNOSIS — N871 Moderate cervical dysplasia: Secondary | ICD-10-CM | POA: Diagnosis not present

## 2017-09-01 DIAGNOSIS — D631 Anemia in chronic kidney disease: Secondary | ICD-10-CM | POA: Diagnosis not present

## 2017-09-01 DIAGNOSIS — N186 End stage renal disease: Secondary | ICD-10-CM | POA: Diagnosis not present

## 2017-09-01 DIAGNOSIS — N2581 Secondary hyperparathyroidism of renal origin: Secondary | ICD-10-CM | POA: Diagnosis not present

## 2017-09-04 DIAGNOSIS — N186 End stage renal disease: Secondary | ICD-10-CM | POA: Diagnosis not present

## 2017-09-04 DIAGNOSIS — D631 Anemia in chronic kidney disease: Secondary | ICD-10-CM | POA: Diagnosis not present

## 2017-09-04 DIAGNOSIS — N2581 Secondary hyperparathyroidism of renal origin: Secondary | ICD-10-CM | POA: Diagnosis not present

## 2017-09-06 DIAGNOSIS — D631 Anemia in chronic kidney disease: Secondary | ICD-10-CM | POA: Diagnosis not present

## 2017-09-06 DIAGNOSIS — N186 End stage renal disease: Secondary | ICD-10-CM | POA: Diagnosis not present

## 2017-09-06 DIAGNOSIS — N2581 Secondary hyperparathyroidism of renal origin: Secondary | ICD-10-CM | POA: Diagnosis not present

## 2017-09-11 DIAGNOSIS — N186 End stage renal disease: Secondary | ICD-10-CM | POA: Diagnosis not present

## 2017-09-11 DIAGNOSIS — N2581 Secondary hyperparathyroidism of renal origin: Secondary | ICD-10-CM | POA: Diagnosis not present

## 2017-09-11 DIAGNOSIS — D631 Anemia in chronic kidney disease: Secondary | ICD-10-CM | POA: Diagnosis not present

## 2017-09-14 DIAGNOSIS — M79674 Pain in right toe(s): Secondary | ICD-10-CM | POA: Diagnosis not present

## 2017-09-14 DIAGNOSIS — I519 Heart disease, unspecified: Secondary | ICD-10-CM | POA: Diagnosis not present

## 2017-09-14 DIAGNOSIS — M79671 Pain in right foot: Secondary | ICD-10-CM | POA: Diagnosis not present

## 2017-09-14 DIAGNOSIS — I1311 Hypertensive heart and chronic kidney disease without heart failure, with stage 5 chronic kidney disease, or end stage renal disease: Secondary | ICD-10-CM | POA: Diagnosis not present

## 2017-09-14 DIAGNOSIS — F17200 Nicotine dependence, unspecified, uncomplicated: Secondary | ICD-10-CM | POA: Diagnosis not present

## 2017-09-14 DIAGNOSIS — N186 End stage renal disease: Secondary | ICD-10-CM | POA: Diagnosis not present

## 2017-09-14 DIAGNOSIS — Z992 Dependence on renal dialysis: Secondary | ICD-10-CM | POA: Diagnosis not present

## 2017-09-14 DIAGNOSIS — I129 Hypertensive chronic kidney disease with stage 1 through stage 4 chronic kidney disease, or unspecified chronic kidney disease: Secondary | ICD-10-CM | POA: Diagnosis not present

## 2017-09-14 DIAGNOSIS — E875 Hyperkalemia: Secondary | ICD-10-CM | POA: Diagnosis not present

## 2017-09-14 DIAGNOSIS — I160 Hypertensive urgency: Secondary | ICD-10-CM | POA: Diagnosis not present

## 2017-09-15 DIAGNOSIS — I160 Hypertensive urgency: Secondary | ICD-10-CM | POA: Diagnosis not present

## 2017-09-15 DIAGNOSIS — N186 End stage renal disease: Secondary | ICD-10-CM | POA: Diagnosis not present

## 2017-09-15 DIAGNOSIS — E875 Hyperkalemia: Secondary | ICD-10-CM | POA: Diagnosis not present

## 2017-09-15 DIAGNOSIS — Z992 Dependence on renal dialysis: Secondary | ICD-10-CM | POA: Diagnosis not present

## 2017-09-18 DIAGNOSIS — N2581 Secondary hyperparathyroidism of renal origin: Secondary | ICD-10-CM | POA: Diagnosis not present

## 2017-09-18 DIAGNOSIS — D631 Anemia in chronic kidney disease: Secondary | ICD-10-CM | POA: Diagnosis not present

## 2017-09-18 DIAGNOSIS — N186 End stage renal disease: Secondary | ICD-10-CM | POA: Diagnosis not present

## 2017-09-20 DIAGNOSIS — N186 End stage renal disease: Secondary | ICD-10-CM | POA: Diagnosis not present

## 2017-09-20 DIAGNOSIS — N2581 Secondary hyperparathyroidism of renal origin: Secondary | ICD-10-CM | POA: Diagnosis not present

## 2017-09-20 DIAGNOSIS — D631 Anemia in chronic kidney disease: Secondary | ICD-10-CM | POA: Diagnosis not present

## 2017-09-22 DIAGNOSIS — D631 Anemia in chronic kidney disease: Secondary | ICD-10-CM | POA: Diagnosis not present

## 2017-09-22 DIAGNOSIS — N186 End stage renal disease: Secondary | ICD-10-CM | POA: Diagnosis not present

## 2017-09-22 DIAGNOSIS — N2581 Secondary hyperparathyroidism of renal origin: Secondary | ICD-10-CM | POA: Diagnosis not present

## 2017-09-24 DIAGNOSIS — Z992 Dependence on renal dialysis: Secondary | ICD-10-CM | POA: Diagnosis not present

## 2017-09-24 DIAGNOSIS — N186 End stage renal disease: Secondary | ICD-10-CM | POA: Diagnosis not present

## 2017-09-25 DIAGNOSIS — N2581 Secondary hyperparathyroidism of renal origin: Secondary | ICD-10-CM | POA: Diagnosis not present

## 2017-09-25 DIAGNOSIS — D508 Other iron deficiency anemias: Secondary | ICD-10-CM | POA: Diagnosis not present

## 2017-09-25 DIAGNOSIS — N186 End stage renal disease: Secondary | ICD-10-CM | POA: Diagnosis not present

## 2017-09-27 DIAGNOSIS — N2581 Secondary hyperparathyroidism of renal origin: Secondary | ICD-10-CM | POA: Diagnosis not present

## 2017-09-27 DIAGNOSIS — D508 Other iron deficiency anemias: Secondary | ICD-10-CM | POA: Diagnosis not present

## 2017-09-27 DIAGNOSIS — N186 End stage renal disease: Secondary | ICD-10-CM | POA: Diagnosis not present

## 2017-09-29 DIAGNOSIS — D508 Other iron deficiency anemias: Secondary | ICD-10-CM | POA: Diagnosis not present

## 2017-09-29 DIAGNOSIS — N2581 Secondary hyperparathyroidism of renal origin: Secondary | ICD-10-CM | POA: Diagnosis not present

## 2017-09-29 DIAGNOSIS — N186 End stage renal disease: Secondary | ICD-10-CM | POA: Diagnosis not present

## 2017-10-04 DIAGNOSIS — N186 End stage renal disease: Secondary | ICD-10-CM | POA: Diagnosis not present

## 2017-10-04 DIAGNOSIS — N2581 Secondary hyperparathyroidism of renal origin: Secondary | ICD-10-CM | POA: Diagnosis not present

## 2017-10-04 DIAGNOSIS — D508 Other iron deficiency anemias: Secondary | ICD-10-CM | POA: Diagnosis not present

## 2017-10-06 DIAGNOSIS — N186 End stage renal disease: Secondary | ICD-10-CM | POA: Diagnosis not present

## 2017-10-09 DIAGNOSIS — N186 End stage renal disease: Secondary | ICD-10-CM | POA: Diagnosis not present

## 2017-10-11 DIAGNOSIS — N186 End stage renal disease: Secondary | ICD-10-CM | POA: Diagnosis not present

## 2017-10-13 DIAGNOSIS — N186 End stage renal disease: Secondary | ICD-10-CM | POA: Diagnosis not present

## 2017-10-16 DIAGNOSIS — N186 End stage renal disease: Secondary | ICD-10-CM | POA: Diagnosis not present

## 2017-10-18 DIAGNOSIS — N186 End stage renal disease: Secondary | ICD-10-CM | POA: Diagnosis not present

## 2017-10-20 DIAGNOSIS — N186 End stage renal disease: Secondary | ICD-10-CM | POA: Diagnosis not present

## 2017-10-23 DIAGNOSIS — N186 End stage renal disease: Secondary | ICD-10-CM | POA: Diagnosis not present

## 2017-10-25 DIAGNOSIS — Z992 Dependence on renal dialysis: Secondary | ICD-10-CM | POA: Diagnosis not present

## 2017-10-25 DIAGNOSIS — N186 End stage renal disease: Secondary | ICD-10-CM | POA: Diagnosis not present

## 2017-10-27 DIAGNOSIS — D631 Anemia in chronic kidney disease: Secondary | ICD-10-CM | POA: Diagnosis not present

## 2017-10-27 DIAGNOSIS — N186 End stage renal disease: Secondary | ICD-10-CM | POA: Diagnosis not present

## 2017-10-30 DIAGNOSIS — N186 End stage renal disease: Secondary | ICD-10-CM | POA: Diagnosis not present

## 2017-10-30 DIAGNOSIS — D631 Anemia in chronic kidney disease: Secondary | ICD-10-CM | POA: Diagnosis not present

## 2017-11-01 DIAGNOSIS — Z8673 Personal history of transient ischemic attack (TIA), and cerebral infarction without residual deficits: Secondary | ICD-10-CM | POA: Diagnosis not present

## 2017-11-01 DIAGNOSIS — I10 Essential (primary) hypertension: Secondary | ICD-10-CM | POA: Diagnosis not present

## 2017-11-01 DIAGNOSIS — M79671 Pain in right foot: Secondary | ICD-10-CM | POA: Diagnosis not present

## 2017-11-01 DIAGNOSIS — Z992 Dependence on renal dialysis: Secondary | ICD-10-CM | POA: Diagnosis not present

## 2017-11-01 DIAGNOSIS — M7731 Calcaneal spur, right foot: Secondary | ICD-10-CM | POA: Diagnosis not present

## 2017-11-01 DIAGNOSIS — E1122 Type 2 diabetes mellitus with diabetic chronic kidney disease: Secondary | ICD-10-CM | POA: Diagnosis not present

## 2017-11-01 DIAGNOSIS — E1151 Type 2 diabetes mellitus with diabetic peripheral angiopathy without gangrene: Secondary | ICD-10-CM | POA: Diagnosis not present

## 2017-11-01 DIAGNOSIS — Z79899 Other long term (current) drug therapy: Secondary | ICD-10-CM | POA: Diagnosis not present

## 2017-11-01 DIAGNOSIS — G40909 Epilepsy, unspecified, not intractable, without status epilepticus: Secondary | ICD-10-CM | POA: Diagnosis not present

## 2017-11-01 DIAGNOSIS — N186 End stage renal disease: Secondary | ICD-10-CM | POA: Diagnosis not present

## 2017-11-01 DIAGNOSIS — I120 Hypertensive chronic kidney disease with stage 5 chronic kidney disease or end stage renal disease: Secondary | ICD-10-CM | POA: Diagnosis not present

## 2017-11-01 DIAGNOSIS — M79672 Pain in left foot: Secondary | ICD-10-CM | POA: Diagnosis not present

## 2017-11-01 DIAGNOSIS — F17200 Nicotine dependence, unspecified, uncomplicated: Secondary | ICD-10-CM | POA: Diagnosis not present

## 2017-11-01 DIAGNOSIS — I70209 Unspecified atherosclerosis of native arteries of extremities, unspecified extremity: Secondary | ICD-10-CM | POA: Diagnosis not present

## 2017-11-03 DIAGNOSIS — D631 Anemia in chronic kidney disease: Secondary | ICD-10-CM | POA: Diagnosis not present

## 2017-11-03 DIAGNOSIS — N186 End stage renal disease: Secondary | ICD-10-CM | POA: Diagnosis not present

## 2017-11-08 DIAGNOSIS — D631 Anemia in chronic kidney disease: Secondary | ICD-10-CM | POA: Diagnosis not present

## 2017-11-08 DIAGNOSIS — N186 End stage renal disease: Secondary | ICD-10-CM | POA: Diagnosis not present

## 2017-11-10 DIAGNOSIS — D631 Anemia in chronic kidney disease: Secondary | ICD-10-CM | POA: Diagnosis not present

## 2017-11-10 DIAGNOSIS — N186 End stage renal disease: Secondary | ICD-10-CM | POA: Diagnosis not present

## 2017-11-13 DIAGNOSIS — D631 Anemia in chronic kidney disease: Secondary | ICD-10-CM | POA: Diagnosis not present

## 2017-11-13 DIAGNOSIS — N186 End stage renal disease: Secondary | ICD-10-CM | POA: Diagnosis not present

## 2017-11-15 DIAGNOSIS — N186 End stage renal disease: Secondary | ICD-10-CM | POA: Diagnosis not present

## 2017-11-15 DIAGNOSIS — D631 Anemia in chronic kidney disease: Secondary | ICD-10-CM | POA: Diagnosis not present

## 2017-11-17 DIAGNOSIS — N186 End stage renal disease: Secondary | ICD-10-CM | POA: Diagnosis not present

## 2017-11-17 DIAGNOSIS — D631 Anemia in chronic kidney disease: Secondary | ICD-10-CM | POA: Diagnosis not present

## 2017-11-20 DIAGNOSIS — Z781 Physical restraint status: Secondary | ICD-10-CM | POA: Diagnosis not present

## 2017-11-20 DIAGNOSIS — E11628 Type 2 diabetes mellitus with other skin complications: Secondary | ICD-10-CM | POA: Diagnosis not present

## 2017-11-20 DIAGNOSIS — G8918 Other acute postprocedural pain: Secondary | ICD-10-CM | POA: Diagnosis not present

## 2017-11-20 DIAGNOSIS — N186 End stage renal disease: Secondary | ICD-10-CM | POA: Diagnosis not present

## 2017-11-20 DIAGNOSIS — Z992 Dependence on renal dialysis: Secondary | ICD-10-CM | POA: Diagnosis not present

## 2017-11-20 DIAGNOSIS — I120 Hypertensive chronic kidney disease with stage 5 chronic kidney disease or end stage renal disease: Secondary | ICD-10-CM | POA: Diagnosis not present

## 2017-11-20 DIAGNOSIS — R030 Elevated blood-pressure reading, without diagnosis of hypertension: Secondary | ICD-10-CM | POA: Diagnosis not present

## 2017-11-20 DIAGNOSIS — L02619 Cutaneous abscess of unspecified foot: Secondary | ICD-10-CM | POA: Diagnosis not present

## 2017-11-20 DIAGNOSIS — E871 Hypo-osmolality and hyponatremia: Secondary | ICD-10-CM | POA: Diagnosis not present

## 2017-11-20 DIAGNOSIS — T82524A Displacement of infusion catheter, initial encounter: Secondary | ICD-10-CM | POA: Diagnosis not present

## 2017-11-20 DIAGNOSIS — I7092 Chronic total occlusion of artery of the extremities: Secondary | ICD-10-CM | POA: Diagnosis not present

## 2017-11-20 DIAGNOSIS — I160 Hypertensive urgency: Secondary | ICD-10-CM | POA: Diagnosis present

## 2017-11-20 DIAGNOSIS — D638 Anemia in other chronic diseases classified elsewhere: Secondary | ICD-10-CM | POA: Diagnosis present

## 2017-11-20 DIAGNOSIS — T8141XA Infection following a procedure, superficial incisional surgical site, initial encounter: Secondary | ICD-10-CM | POA: Diagnosis present

## 2017-11-20 DIAGNOSIS — T8131XD Disruption of external operation (surgical) wound, not elsewhere classified, subsequent encounter: Secondary | ICD-10-CM | POA: Diagnosis not present

## 2017-11-20 DIAGNOSIS — I70262 Atherosclerosis of native arteries of extremities with gangrene, left leg: Secondary | ICD-10-CM | POA: Diagnosis not present

## 2017-11-20 DIAGNOSIS — I129 Hypertensive chronic kidney disease with stage 1 through stage 4 chronic kidney disease, or unspecified chronic kidney disease: Secondary | ICD-10-CM | POA: Diagnosis not present

## 2017-11-20 DIAGNOSIS — I959 Hypotension, unspecified: Secondary | ICD-10-CM | POA: Diagnosis not present

## 2017-11-20 DIAGNOSIS — M79673 Pain in unspecified foot: Secondary | ICD-10-CM | POA: Diagnosis not present

## 2017-11-20 DIAGNOSIS — I70245 Atherosclerosis of native arteries of left leg with ulceration of other part of foot: Secondary | ICD-10-CM | POA: Diagnosis not present

## 2017-11-20 DIAGNOSIS — I739 Peripheral vascular disease, unspecified: Secondary | ICD-10-CM | POA: Diagnosis not present

## 2017-11-20 DIAGNOSIS — Z9889 Other specified postprocedural states: Secondary | ICD-10-CM | POA: Diagnosis not present

## 2017-11-20 DIAGNOSIS — L97518 Non-pressure chronic ulcer of other part of right foot with other specified severity: Secondary | ICD-10-CM | POA: Diagnosis not present

## 2017-11-20 DIAGNOSIS — D631 Anemia in chronic kidney disease: Secondary | ICD-10-CM | POA: Diagnosis not present

## 2017-11-20 DIAGNOSIS — L97512 Non-pressure chronic ulcer of other part of right foot with fat layer exposed: Secondary | ICD-10-CM | POA: Diagnosis not present

## 2017-11-20 DIAGNOSIS — S91301A Unspecified open wound, right foot, initial encounter: Secondary | ICD-10-CM | POA: Diagnosis not present

## 2017-11-20 DIAGNOSIS — L98498 Non-pressure chronic ulcer of skin of other sites with other specified severity: Secondary | ICD-10-CM | POA: Diagnosis not present

## 2017-11-20 DIAGNOSIS — D62 Acute posthemorrhagic anemia: Secondary | ICD-10-CM | POA: Diagnosis not present

## 2017-11-20 DIAGNOSIS — L0390 Cellulitis, unspecified: Secondary | ICD-10-CM | POA: Diagnosis not present

## 2017-11-20 DIAGNOSIS — A419 Sepsis, unspecified organism: Secondary | ICD-10-CM | POA: Diagnosis not present

## 2017-11-20 DIAGNOSIS — E11621 Type 2 diabetes mellitus with foot ulcer: Secondary | ICD-10-CM | POA: Diagnosis not present

## 2017-11-20 DIAGNOSIS — M79671 Pain in right foot: Secondary | ICD-10-CM | POA: Diagnosis not present

## 2017-11-20 DIAGNOSIS — L089 Local infection of the skin and subcutaneous tissue, unspecified: Secondary | ICD-10-CM | POA: Diagnosis not present

## 2017-11-20 DIAGNOSIS — L02611 Cutaneous abscess of right foot: Secondary | ICD-10-CM | POA: Diagnosis not present

## 2017-11-20 DIAGNOSIS — N189 Chronic kidney disease, unspecified: Secondary | ICD-10-CM | POA: Diagnosis not present

## 2017-11-20 DIAGNOSIS — H548 Legal blindness, as defined in USA: Secondary | ICD-10-CM | POA: Diagnosis present

## 2017-11-20 DIAGNOSIS — L97515 Non-pressure chronic ulcer of other part of right foot with muscle involvement without evidence of necrosis: Secondary | ICD-10-CM | POA: Diagnosis not present

## 2017-11-20 DIAGNOSIS — R443 Hallucinations, unspecified: Secondary | ICD-10-CM | POA: Diagnosis not present

## 2017-11-20 DIAGNOSIS — I998 Other disorder of circulatory system: Secondary | ICD-10-CM | POA: Diagnosis not present

## 2017-11-20 DIAGNOSIS — I70263 Atherosclerosis of native arteries of extremities with gangrene, bilateral legs: Secondary | ICD-10-CM | POA: Diagnosis not present

## 2017-11-20 DIAGNOSIS — M86171 Other acute osteomyelitis, right ankle and foot: Secondary | ICD-10-CM | POA: Diagnosis not present

## 2017-11-20 DIAGNOSIS — E669 Obesity, unspecified: Secondary | ICD-10-CM | POA: Diagnosis not present

## 2017-11-20 DIAGNOSIS — I70261 Atherosclerosis of native arteries of extremities with gangrene, right leg: Secondary | ICD-10-CM | POA: Diagnosis not present

## 2017-11-20 DIAGNOSIS — L03115 Cellulitis of right lower limb: Secondary | ICD-10-CM | POA: Diagnosis not present

## 2017-11-20 DIAGNOSIS — E1152 Type 2 diabetes mellitus with diabetic peripheral angiopathy with gangrene: Secondary | ICD-10-CM | POA: Diagnosis present

## 2017-11-20 DIAGNOSIS — Z452 Encounter for adjustment and management of vascular access device: Secondary | ICD-10-CM | POA: Diagnosis not present

## 2017-11-20 DIAGNOSIS — I96 Gangrene, not elsewhere classified: Secondary | ICD-10-CM | POA: Diagnosis not present

## 2017-11-20 DIAGNOSIS — T8149XA Infection following a procedure, other surgical site, initial encounter: Secondary | ICD-10-CM | POA: Diagnosis not present

## 2017-11-20 DIAGNOSIS — T8131XA Disruption of external operation (surgical) wound, not elsewhere classified, initial encounter: Secondary | ICD-10-CM | POA: Diagnosis not present

## 2017-11-20 DIAGNOSIS — I70235 Atherosclerosis of native arteries of right leg with ulceration of other part of foot: Secondary | ICD-10-CM | POA: Diagnosis not present

## 2017-11-20 DIAGNOSIS — R109 Unspecified abdominal pain: Secondary | ICD-10-CM | POA: Diagnosis not present

## 2017-11-24 DIAGNOSIS — N186 End stage renal disease: Secondary | ICD-10-CM | POA: Diagnosis not present

## 2017-11-24 DIAGNOSIS — Z992 Dependence on renal dialysis: Secondary | ICD-10-CM | POA: Diagnosis not present

## 2017-12-04 DIAGNOSIS — I120 Hypertensive chronic kidney disease with stage 5 chronic kidney disease or end stage renal disease: Secondary | ICD-10-CM | POA: Diagnosis not present

## 2017-12-04 DIAGNOSIS — N186 End stage renal disease: Secondary | ICD-10-CM | POA: Diagnosis not present

## 2017-12-04 DIAGNOSIS — I739 Peripheral vascular disease, unspecified: Secondary | ICD-10-CM | POA: Diagnosis not present

## 2017-12-04 DIAGNOSIS — E669 Obesity, unspecified: Secondary | ICD-10-CM | POA: Diagnosis not present

## 2017-12-15 DIAGNOSIS — T8131XD Disruption of external operation (surgical) wound, not elsewhere classified, subsequent encounter: Secondary | ICD-10-CM | POA: Diagnosis not present

## 2017-12-15 DIAGNOSIS — I70261 Atherosclerosis of native arteries of extremities with gangrene, right leg: Secondary | ICD-10-CM | POA: Diagnosis not present

## 2017-12-24 DIAGNOSIS — N186 End stage renal disease: Secondary | ICD-10-CM | POA: Diagnosis not present

## 2017-12-26 DIAGNOSIS — B965 Pseudomonas (aeruginosa) (mallei) (pseudomallei) as the cause of diseases classified elsewhere: Secondary | ICD-10-CM | POA: Diagnosis not present

## 2017-12-26 DIAGNOSIS — T8141XA Infection following a procedure, superficial incisional surgical site, initial encounter: Secondary | ICD-10-CM | POA: Diagnosis not present

## 2017-12-26 DIAGNOSIS — L03115 Cellulitis of right lower limb: Secondary | ICD-10-CM | POA: Diagnosis not present

## 2017-12-26 DIAGNOSIS — T8131XA Disruption of external operation (surgical) wound, not elsewhere classified, initial encounter: Secondary | ICD-10-CM | POA: Diagnosis not present

## 2017-12-26 DIAGNOSIS — B952 Enterococcus as the cause of diseases classified elsewhere: Secondary | ICD-10-CM | POA: Diagnosis not present

## 2017-12-26 DIAGNOSIS — I739 Peripheral vascular disease, unspecified: Secondary | ICD-10-CM | POA: Diagnosis not present

## 2017-12-27 DIAGNOSIS — L089 Local infection of the skin and subcutaneous tissue, unspecified: Secondary | ICD-10-CM | POA: Diagnosis not present

## 2017-12-27 DIAGNOSIS — N186 End stage renal disease: Secondary | ICD-10-CM | POA: Diagnosis not present

## 2017-12-27 DIAGNOSIS — A4902 Methicillin resistant Staphylococcus aureus infection, unspecified site: Secondary | ICD-10-CM | POA: Diagnosis not present

## 2017-12-27 DIAGNOSIS — D631 Anemia in chronic kidney disease: Secondary | ICD-10-CM | POA: Diagnosis not present

## 2017-12-28 DIAGNOSIS — B952 Enterococcus as the cause of diseases classified elsewhere: Secondary | ICD-10-CM | POA: Diagnosis not present

## 2017-12-28 DIAGNOSIS — L03115 Cellulitis of right lower limb: Secondary | ICD-10-CM | POA: Diagnosis not present

## 2017-12-28 DIAGNOSIS — T8141XA Infection following a procedure, superficial incisional surgical site, initial encounter: Secondary | ICD-10-CM | POA: Diagnosis not present

## 2017-12-28 DIAGNOSIS — I739 Peripheral vascular disease, unspecified: Secondary | ICD-10-CM | POA: Diagnosis not present

## 2017-12-28 DIAGNOSIS — B965 Pseudomonas (aeruginosa) (mallei) (pseudomallei) as the cause of diseases classified elsewhere: Secondary | ICD-10-CM | POA: Diagnosis not present

## 2017-12-28 DIAGNOSIS — T8131XA Disruption of external operation (surgical) wound, not elsewhere classified, initial encounter: Secondary | ICD-10-CM | POA: Diagnosis not present

## 2017-12-29 DIAGNOSIS — D631 Anemia in chronic kidney disease: Secondary | ICD-10-CM | POA: Diagnosis not present

## 2017-12-29 DIAGNOSIS — N186 End stage renal disease: Secondary | ICD-10-CM | POA: Diagnosis not present

## 2017-12-29 DIAGNOSIS — L089 Local infection of the skin and subcutaneous tissue, unspecified: Secondary | ICD-10-CM | POA: Diagnosis not present

## 2017-12-29 DIAGNOSIS — A4902 Methicillin resistant Staphylococcus aureus infection, unspecified site: Secondary | ICD-10-CM | POA: Diagnosis not present

## 2017-12-30 DIAGNOSIS — T8141XA Infection following a procedure, superficial incisional surgical site, initial encounter: Secondary | ICD-10-CM | POA: Diagnosis not present

## 2017-12-30 DIAGNOSIS — B952 Enterococcus as the cause of diseases classified elsewhere: Secondary | ICD-10-CM | POA: Diagnosis not present

## 2017-12-30 DIAGNOSIS — B965 Pseudomonas (aeruginosa) (mallei) (pseudomallei) as the cause of diseases classified elsewhere: Secondary | ICD-10-CM | POA: Diagnosis not present

## 2017-12-30 DIAGNOSIS — L03115 Cellulitis of right lower limb: Secondary | ICD-10-CM | POA: Diagnosis not present

## 2017-12-30 DIAGNOSIS — T8131XA Disruption of external operation (surgical) wound, not elsewhere classified, initial encounter: Secondary | ICD-10-CM | POA: Diagnosis not present

## 2017-12-30 DIAGNOSIS — I739 Peripheral vascular disease, unspecified: Secondary | ICD-10-CM | POA: Diagnosis not present

## 2018-01-01 DIAGNOSIS — D631 Anemia in chronic kidney disease: Secondary | ICD-10-CM | POA: Diagnosis not present

## 2018-01-01 DIAGNOSIS — L089 Local infection of the skin and subcutaneous tissue, unspecified: Secondary | ICD-10-CM | POA: Diagnosis not present

## 2018-01-01 DIAGNOSIS — N186 End stage renal disease: Secondary | ICD-10-CM | POA: Diagnosis not present

## 2018-01-01 DIAGNOSIS — A4902 Methicillin resistant Staphylococcus aureus infection, unspecified site: Secondary | ICD-10-CM | POA: Diagnosis not present

## 2018-01-02 DIAGNOSIS — T8141XA Infection following a procedure, superficial incisional surgical site, initial encounter: Secondary | ICD-10-CM | POA: Diagnosis not present

## 2018-01-02 DIAGNOSIS — I739 Peripheral vascular disease, unspecified: Secondary | ICD-10-CM | POA: Diagnosis not present

## 2018-01-02 DIAGNOSIS — T8131XA Disruption of external operation (surgical) wound, not elsewhere classified, initial encounter: Secondary | ICD-10-CM | POA: Diagnosis not present

## 2018-01-02 DIAGNOSIS — B952 Enterococcus as the cause of diseases classified elsewhere: Secondary | ICD-10-CM | POA: Diagnosis not present

## 2018-01-02 DIAGNOSIS — L03115 Cellulitis of right lower limb: Secondary | ICD-10-CM | POA: Diagnosis not present

## 2018-01-02 DIAGNOSIS — B965 Pseudomonas (aeruginosa) (mallei) (pseudomallei) as the cause of diseases classified elsewhere: Secondary | ICD-10-CM | POA: Diagnosis not present

## 2018-01-04 DIAGNOSIS — T8131XA Disruption of external operation (surgical) wound, not elsewhere classified, initial encounter: Secondary | ICD-10-CM | POA: Diagnosis not present

## 2018-01-04 DIAGNOSIS — B952 Enterococcus as the cause of diseases classified elsewhere: Secondary | ICD-10-CM | POA: Diagnosis not present

## 2018-01-04 DIAGNOSIS — B965 Pseudomonas (aeruginosa) (mallei) (pseudomallei) as the cause of diseases classified elsewhere: Secondary | ICD-10-CM | POA: Diagnosis not present

## 2018-01-04 DIAGNOSIS — L03115 Cellulitis of right lower limb: Secondary | ICD-10-CM | POA: Diagnosis not present

## 2018-01-04 DIAGNOSIS — I739 Peripheral vascular disease, unspecified: Secondary | ICD-10-CM | POA: Diagnosis not present

## 2018-01-04 DIAGNOSIS — T8141XA Infection following a procedure, superficial incisional surgical site, initial encounter: Secondary | ICD-10-CM | POA: Diagnosis not present

## 2018-01-06 DIAGNOSIS — A4902 Methicillin resistant Staphylococcus aureus infection, unspecified site: Secondary | ICD-10-CM | POA: Diagnosis not present

## 2018-01-06 DIAGNOSIS — L089 Local infection of the skin and subcutaneous tissue, unspecified: Secondary | ICD-10-CM | POA: Diagnosis not present

## 2018-01-06 DIAGNOSIS — N186 End stage renal disease: Secondary | ICD-10-CM | POA: Diagnosis not present

## 2018-01-06 DIAGNOSIS — D631 Anemia in chronic kidney disease: Secondary | ICD-10-CM | POA: Diagnosis not present

## 2018-01-08 DIAGNOSIS — N186 End stage renal disease: Secondary | ICD-10-CM | POA: Diagnosis not present

## 2018-01-08 DIAGNOSIS — A4902 Methicillin resistant Staphylococcus aureus infection, unspecified site: Secondary | ICD-10-CM | POA: Diagnosis not present

## 2018-01-08 DIAGNOSIS — L089 Local infection of the skin and subcutaneous tissue, unspecified: Secondary | ICD-10-CM | POA: Diagnosis not present

## 2018-01-08 DIAGNOSIS — D631 Anemia in chronic kidney disease: Secondary | ICD-10-CM | POA: Diagnosis not present

## 2018-01-09 DIAGNOSIS — B965 Pseudomonas (aeruginosa) (mallei) (pseudomallei) as the cause of diseases classified elsewhere: Secondary | ICD-10-CM | POA: Diagnosis not present

## 2018-01-09 DIAGNOSIS — B952 Enterococcus as the cause of diseases classified elsewhere: Secondary | ICD-10-CM | POA: Diagnosis not present

## 2018-01-09 DIAGNOSIS — T8141XA Infection following a procedure, superficial incisional surgical site, initial encounter: Secondary | ICD-10-CM | POA: Diagnosis not present

## 2018-01-09 DIAGNOSIS — I739 Peripheral vascular disease, unspecified: Secondary | ICD-10-CM | POA: Diagnosis not present

## 2018-01-09 DIAGNOSIS — T8131XA Disruption of external operation (surgical) wound, not elsewhere classified, initial encounter: Secondary | ICD-10-CM | POA: Diagnosis not present

## 2018-01-09 DIAGNOSIS — L03115 Cellulitis of right lower limb: Secondary | ICD-10-CM | POA: Diagnosis not present

## 2018-01-10 DIAGNOSIS — L089 Local infection of the skin and subcutaneous tissue, unspecified: Secondary | ICD-10-CM | POA: Diagnosis not present

## 2018-01-10 DIAGNOSIS — D631 Anemia in chronic kidney disease: Secondary | ICD-10-CM | POA: Diagnosis not present

## 2018-01-10 DIAGNOSIS — A4902 Methicillin resistant Staphylococcus aureus infection, unspecified site: Secondary | ICD-10-CM | POA: Diagnosis not present

## 2018-01-10 DIAGNOSIS — N186 End stage renal disease: Secondary | ICD-10-CM | POA: Diagnosis not present

## 2018-01-11 DIAGNOSIS — T8141XA Infection following a procedure, superficial incisional surgical site, initial encounter: Secondary | ICD-10-CM | POA: Diagnosis not present

## 2018-01-11 DIAGNOSIS — B965 Pseudomonas (aeruginosa) (mallei) (pseudomallei) as the cause of diseases classified elsewhere: Secondary | ICD-10-CM | POA: Diagnosis not present

## 2018-01-11 DIAGNOSIS — B952 Enterococcus as the cause of diseases classified elsewhere: Secondary | ICD-10-CM | POA: Diagnosis not present

## 2018-01-11 DIAGNOSIS — T8131XA Disruption of external operation (surgical) wound, not elsewhere classified, initial encounter: Secondary | ICD-10-CM | POA: Diagnosis not present

## 2018-01-11 DIAGNOSIS — I739 Peripheral vascular disease, unspecified: Secondary | ICD-10-CM | POA: Diagnosis not present

## 2018-01-11 DIAGNOSIS — L03115 Cellulitis of right lower limb: Secondary | ICD-10-CM | POA: Diagnosis not present

## 2018-01-12 DIAGNOSIS — I739 Peripheral vascular disease, unspecified: Secondary | ICD-10-CM | POA: Diagnosis not present

## 2018-01-12 DIAGNOSIS — B965 Pseudomonas (aeruginosa) (mallei) (pseudomallei) as the cause of diseases classified elsewhere: Secondary | ICD-10-CM | POA: Diagnosis not present

## 2018-01-12 DIAGNOSIS — L03115 Cellulitis of right lower limb: Secondary | ICD-10-CM | POA: Diagnosis not present

## 2018-01-12 DIAGNOSIS — T8141XA Infection following a procedure, superficial incisional surgical site, initial encounter: Secondary | ICD-10-CM | POA: Diagnosis not present

## 2018-01-12 DIAGNOSIS — B952 Enterococcus as the cause of diseases classified elsewhere: Secondary | ICD-10-CM | POA: Diagnosis not present

## 2018-01-12 DIAGNOSIS — T8131XA Disruption of external operation (surgical) wound, not elsewhere classified, initial encounter: Secondary | ICD-10-CM | POA: Diagnosis not present

## 2018-01-13 DIAGNOSIS — B965 Pseudomonas (aeruginosa) (mallei) (pseudomallei) as the cause of diseases classified elsewhere: Secondary | ICD-10-CM | POA: Diagnosis not present

## 2018-01-13 DIAGNOSIS — L03115 Cellulitis of right lower limb: Secondary | ICD-10-CM | POA: Diagnosis not present

## 2018-01-13 DIAGNOSIS — I739 Peripheral vascular disease, unspecified: Secondary | ICD-10-CM | POA: Diagnosis not present

## 2018-01-13 DIAGNOSIS — T8141XA Infection following a procedure, superficial incisional surgical site, initial encounter: Secondary | ICD-10-CM | POA: Diagnosis not present

## 2018-01-13 DIAGNOSIS — T8131XA Disruption of external operation (surgical) wound, not elsewhere classified, initial encounter: Secondary | ICD-10-CM | POA: Diagnosis not present

## 2018-01-13 DIAGNOSIS — B952 Enterococcus as the cause of diseases classified elsewhere: Secondary | ICD-10-CM | POA: Diagnosis not present

## 2018-01-16 DIAGNOSIS — T8131XA Disruption of external operation (surgical) wound, not elsewhere classified, initial encounter: Secondary | ICD-10-CM | POA: Diagnosis not present

## 2018-01-16 DIAGNOSIS — J690 Pneumonitis due to inhalation of food and vomit: Secondary | ICD-10-CM | POA: Diagnosis not present

## 2018-01-16 DIAGNOSIS — I739 Peripheral vascular disease, unspecified: Secondary | ICD-10-CM | POA: Diagnosis not present

## 2018-01-16 DIAGNOSIS — R401 Stupor: Secondary | ICD-10-CM | POA: Diagnosis not present

## 2018-01-16 DIAGNOSIS — E875 Hyperkalemia: Secondary | ICD-10-CM | POA: Diagnosis not present

## 2018-01-16 DIAGNOSIS — B965 Pseudomonas (aeruginosa) (mallei) (pseudomallei) as the cause of diseases classified elsewhere: Secondary | ICD-10-CM | POA: Diagnosis not present

## 2018-01-16 DIAGNOSIS — L03115 Cellulitis of right lower limb: Secondary | ICD-10-CM | POA: Diagnosis not present

## 2018-01-16 DIAGNOSIS — B952 Enterococcus as the cause of diseases classified elsewhere: Secondary | ICD-10-CM | POA: Diagnosis not present

## 2018-01-16 DIAGNOSIS — Z992 Dependence on renal dialysis: Secondary | ICD-10-CM | POA: Diagnosis not present

## 2018-01-16 DIAGNOSIS — F17200 Nicotine dependence, unspecified, uncomplicated: Secondary | ICD-10-CM | POA: Diagnosis not present

## 2018-01-16 DIAGNOSIS — I469 Cardiac arrest, cause unspecified: Secondary | ICD-10-CM | POA: Diagnosis not present

## 2018-01-16 DIAGNOSIS — I120 Hypertensive chronic kidney disease with stage 5 chronic kidney disease or end stage renal disease: Secondary | ICD-10-CM | POA: Diagnosis not present

## 2018-01-16 DIAGNOSIS — G40909 Epilepsy, unspecified, not intractable, without status epilepticus: Secondary | ICD-10-CM | POA: Diagnosis not present

## 2018-01-16 DIAGNOSIS — H547 Unspecified visual loss: Secondary | ICD-10-CM | POA: Diagnosis not present

## 2018-01-16 DIAGNOSIS — I6200 Nontraumatic subdural hemorrhage, unspecified: Secondary | ICD-10-CM | POA: Diagnosis not present

## 2018-01-16 DIAGNOSIS — Z8673 Personal history of transient ischemic attack (TIA), and cerebral infarction without residual deficits: Secondary | ICD-10-CM | POA: Diagnosis not present

## 2018-01-16 DIAGNOSIS — D631 Anemia in chronic kidney disease: Secondary | ICD-10-CM | POA: Diagnosis not present

## 2018-01-16 DIAGNOSIS — N186 End stage renal disease: Secondary | ICD-10-CM | POA: Diagnosis not present

## 2018-01-16 DIAGNOSIS — I160 Hypertensive urgency: Secondary | ICD-10-CM | POA: Diagnosis not present

## 2018-01-16 DIAGNOSIS — T8141XA Infection following a procedure, superficial incisional surgical site, initial encounter: Secondary | ICD-10-CM | POA: Diagnosis not present

## 2018-01-17 DIAGNOSIS — F17200 Nicotine dependence, unspecified, uncomplicated: Secondary | ICD-10-CM | POA: Diagnosis not present

## 2018-01-17 DIAGNOSIS — E1122 Type 2 diabetes mellitus with diabetic chronic kidney disease: Secondary | ICD-10-CM | POA: Diagnosis not present

## 2018-01-17 DIAGNOSIS — I6200 Nontraumatic subdural hemorrhage, unspecified: Secondary | ICD-10-CM | POA: Diagnosis not present

## 2018-01-17 DIAGNOSIS — L03115 Cellulitis of right lower limb: Secondary | ICD-10-CM | POA: Diagnosis not present

## 2018-01-17 DIAGNOSIS — R000 Tachycardia, unspecified: Secondary | ICD-10-CM | POA: Diagnosis not present

## 2018-01-17 DIAGNOSIS — G40909 Epilepsy, unspecified, not intractable, without status epilepticus: Secondary | ICD-10-CM | POA: Diagnosis not present

## 2018-01-17 DIAGNOSIS — Z992 Dependence on renal dialysis: Secondary | ICD-10-CM | POA: Diagnosis not present

## 2018-01-17 DIAGNOSIS — E1140 Type 2 diabetes mellitus with diabetic neuropathy, unspecified: Secondary | ICD-10-CM | POA: Diagnosis present

## 2018-01-17 DIAGNOSIS — Z6833 Body mass index (BMI) 33.0-33.9, adult: Secondary | ICD-10-CM | POA: Diagnosis not present

## 2018-01-17 DIAGNOSIS — I739 Peripheral vascular disease, unspecified: Secondary | ICD-10-CM | POA: Diagnosis not present

## 2018-01-17 DIAGNOSIS — L97919 Non-pressure chronic ulcer of unspecified part of right lower leg with unspecified severity: Secondary | ICD-10-CM | POA: Diagnosis present

## 2018-01-17 DIAGNOSIS — R4020 Unspecified coma: Secondary | ICD-10-CM | POA: Diagnosis not present

## 2018-01-17 DIAGNOSIS — I160 Hypertensive urgency: Secondary | ICD-10-CM | POA: Diagnosis not present

## 2018-01-17 DIAGNOSIS — L97911 Non-pressure chronic ulcer of unspecified part of right lower leg limited to breakdown of skin: Secondary | ICD-10-CM | POA: Diagnosis not present

## 2018-01-17 DIAGNOSIS — I120 Hypertensive chronic kidney disease with stage 5 chronic kidney disease or end stage renal disease: Secondary | ICD-10-CM | POA: Diagnosis not present

## 2018-01-17 DIAGNOSIS — E873 Alkalosis: Secondary | ICD-10-CM | POA: Diagnosis present

## 2018-01-17 DIAGNOSIS — Z8673 Personal history of transient ischemic attack (TIA), and cerebral infarction without residual deficits: Secondary | ICD-10-CM | POA: Diagnosis not present

## 2018-01-17 DIAGNOSIS — D649 Anemia, unspecified: Secondary | ICD-10-CM | POA: Diagnosis not present

## 2018-01-17 DIAGNOSIS — S065X0A Traumatic subdural hemorrhage without loss of consciousness, initial encounter: Secondary | ICD-10-CM | POA: Diagnosis present

## 2018-01-17 DIAGNOSIS — N2581 Secondary hyperparathyroidism of renal origin: Secondary | ICD-10-CM | POA: Diagnosis present

## 2018-01-17 DIAGNOSIS — E11649 Type 2 diabetes mellitus with hypoglycemia without coma: Secondary | ICD-10-CM | POA: Diagnosis present

## 2018-01-17 DIAGNOSIS — Z743 Need for continuous supervision: Secondary | ICD-10-CM | POA: Diagnosis not present

## 2018-01-17 DIAGNOSIS — D631 Anemia in chronic kidney disease: Secondary | ICD-10-CM | POA: Diagnosis not present

## 2018-01-17 DIAGNOSIS — J690 Pneumonitis due to inhalation of food and vomit: Secondary | ICD-10-CM | POA: Diagnosis not present

## 2018-01-17 DIAGNOSIS — R918 Other nonspecific abnormal finding of lung field: Secondary | ICD-10-CM | POA: Diagnosis not present

## 2018-01-17 DIAGNOSIS — E8770 Fluid overload, unspecified: Secondary | ICD-10-CM | POA: Diagnosis present

## 2018-01-17 DIAGNOSIS — J9601 Acute respiratory failure with hypoxia: Secondary | ICD-10-CM | POA: Diagnosis not present

## 2018-01-17 DIAGNOSIS — E119 Type 2 diabetes mellitus without complications: Secondary | ICD-10-CM | POA: Diagnosis not present

## 2018-01-17 DIAGNOSIS — J189 Pneumonia, unspecified organism: Secondary | ICD-10-CM | POA: Diagnosis not present

## 2018-01-17 DIAGNOSIS — I959 Hypotension, unspecified: Secondary | ICD-10-CM | POA: Diagnosis not present

## 2018-01-17 DIAGNOSIS — R1312 Dysphagia, oropharyngeal phase: Secondary | ICD-10-CM | POA: Diagnosis not present

## 2018-01-17 DIAGNOSIS — E875 Hyperkalemia: Secondary | ICD-10-CM | POA: Diagnosis not present

## 2018-01-17 DIAGNOSIS — Z9911 Dependence on respirator [ventilator] status: Secondary | ICD-10-CM | POA: Diagnosis not present

## 2018-01-17 DIAGNOSIS — E11610 Type 2 diabetes mellitus with diabetic neuropathic arthropathy: Secondary | ICD-10-CM | POA: Diagnosis not present

## 2018-01-17 DIAGNOSIS — Z89431 Acquired absence of right foot: Secondary | ICD-10-CM | POA: Diagnosis not present

## 2018-01-17 DIAGNOSIS — M6281 Muscle weakness (generalized): Secondary | ICD-10-CM | POA: Diagnosis not present

## 2018-01-17 DIAGNOSIS — H547 Unspecified visual loss: Secondary | ICD-10-CM | POA: Diagnosis not present

## 2018-01-17 DIAGNOSIS — Z4682 Encounter for fitting and adjustment of non-vascular catheter: Secondary | ICD-10-CM | POA: Diagnosis not present

## 2018-01-17 DIAGNOSIS — Z79899 Other long term (current) drug therapy: Secondary | ICD-10-CM | POA: Diagnosis not present

## 2018-01-17 DIAGNOSIS — R279 Unspecified lack of coordination: Secondary | ICD-10-CM | POA: Diagnosis not present

## 2018-01-17 DIAGNOSIS — N186 End stage renal disease: Secondary | ICD-10-CM | POA: Diagnosis not present

## 2018-01-17 DIAGNOSIS — S065X9A Traumatic subdural hemorrhage with loss of consciousness of unspecified duration, initial encounter: Secondary | ICD-10-CM | POA: Diagnosis not present

## 2018-01-17 DIAGNOSIS — R278 Other lack of coordination: Secondary | ICD-10-CM | POA: Diagnosis not present

## 2018-01-17 DIAGNOSIS — E162 Hypoglycemia, unspecified: Secondary | ICD-10-CM | POA: Diagnosis not present

## 2018-01-25 DIAGNOSIS — Z992 Dependence on renal dialysis: Secondary | ICD-10-CM | POA: Diagnosis not present

## 2018-01-25 DIAGNOSIS — N186 End stage renal disease: Secondary | ICD-10-CM | POA: Diagnosis not present

## 2018-02-07 DIAGNOSIS — Z992 Dependence on renal dialysis: Secondary | ICD-10-CM | POA: Diagnosis not present

## 2018-02-07 DIAGNOSIS — B351 Tinea unguium: Secondary | ICD-10-CM | POA: Diagnosis not present

## 2018-02-07 DIAGNOSIS — N186 End stage renal disease: Secondary | ICD-10-CM | POA: Diagnosis not present

## 2018-02-07 DIAGNOSIS — D631 Anemia in chronic kidney disease: Secondary | ICD-10-CM | POA: Diagnosis not present

## 2018-02-07 DIAGNOSIS — Z743 Need for continuous supervision: Secondary | ICD-10-CM | POA: Diagnosis not present

## 2018-02-07 DIAGNOSIS — M6281 Muscle weakness (generalized): Secondary | ICD-10-CM | POA: Diagnosis not present

## 2018-02-07 DIAGNOSIS — E785 Hyperlipidemia, unspecified: Secondary | ICD-10-CM | POA: Diagnosis not present

## 2018-02-07 DIAGNOSIS — R1311 Dysphagia, oral phase: Secondary | ICD-10-CM | POA: Diagnosis not present

## 2018-02-07 DIAGNOSIS — I70213 Atherosclerosis of native arteries of extremities with intermittent claudication, bilateral legs: Secondary | ICD-10-CM | POA: Diagnosis not present

## 2018-02-07 DIAGNOSIS — E1165 Type 2 diabetes mellitus with hyperglycemia: Secondary | ICD-10-CM | POA: Diagnosis not present

## 2018-02-07 DIAGNOSIS — L97911 Non-pressure chronic ulcer of unspecified part of right lower leg limited to breakdown of skin: Secondary | ICD-10-CM | POA: Diagnosis not present

## 2018-02-07 DIAGNOSIS — E872 Acidosis: Secondary | ICD-10-CM | POA: Diagnosis not present

## 2018-02-07 DIAGNOSIS — E875 Hyperkalemia: Secondary | ICD-10-CM | POA: Diagnosis not present

## 2018-02-07 DIAGNOSIS — R279 Unspecified lack of coordination: Secondary | ICD-10-CM | POA: Diagnosis not present

## 2018-02-07 DIAGNOSIS — D649 Anemia, unspecified: Secondary | ICD-10-CM | POA: Diagnosis not present

## 2018-02-07 DIAGNOSIS — Z89431 Acquired absence of right foot: Secondary | ICD-10-CM | POA: Diagnosis not present

## 2018-02-07 DIAGNOSIS — J9601 Acute respiratory failure with hypoxia: Secondary | ICD-10-CM | POA: Diagnosis not present

## 2018-02-07 DIAGNOSIS — E119 Type 2 diabetes mellitus without complications: Secondary | ICD-10-CM | POA: Diagnosis not present

## 2018-02-07 DIAGNOSIS — E669 Obesity, unspecified: Secondary | ICD-10-CM | POA: Diagnosis not present

## 2018-02-07 DIAGNOSIS — R278 Other lack of coordination: Secondary | ICD-10-CM | POA: Diagnosis not present

## 2018-02-07 DIAGNOSIS — I120 Hypertensive chronic kidney disease with stage 5 chronic kidney disease or end stage renal disease: Secondary | ICD-10-CM | POA: Diagnosis not present

## 2018-02-07 DIAGNOSIS — I6200 Nontraumatic subdural hemorrhage, unspecified: Secondary | ICD-10-CM | POA: Diagnosis not present

## 2018-02-07 DIAGNOSIS — L89152 Pressure ulcer of sacral region, stage 2: Secondary | ICD-10-CM | POA: Diagnosis not present

## 2018-02-07 DIAGNOSIS — R1312 Dysphagia, oropharyngeal phase: Secondary | ICD-10-CM | POA: Diagnosis not present

## 2018-02-07 DIAGNOSIS — E1122 Type 2 diabetes mellitus with diabetic chronic kidney disease: Secondary | ICD-10-CM | POA: Diagnosis not present

## 2018-02-08 DIAGNOSIS — D631 Anemia in chronic kidney disease: Secondary | ICD-10-CM | POA: Diagnosis not present

## 2018-02-08 DIAGNOSIS — N186 End stage renal disease: Secondary | ICD-10-CM | POA: Diagnosis not present

## 2018-02-10 DIAGNOSIS — E669 Obesity, unspecified: Secondary | ICD-10-CM | POA: Diagnosis not present

## 2018-02-10 DIAGNOSIS — E1165 Type 2 diabetes mellitus with hyperglycemia: Secondary | ICD-10-CM | POA: Diagnosis not present

## 2018-02-10 DIAGNOSIS — I120 Hypertensive chronic kidney disease with stage 5 chronic kidney disease or end stage renal disease: Secondary | ICD-10-CM | POA: Diagnosis not present

## 2018-02-10 DIAGNOSIS — R1311 Dysphagia, oral phase: Secondary | ICD-10-CM | POA: Diagnosis not present

## 2018-02-10 DIAGNOSIS — N186 End stage renal disease: Secondary | ICD-10-CM | POA: Diagnosis not present

## 2018-02-10 DIAGNOSIS — E785 Hyperlipidemia, unspecified: Secondary | ICD-10-CM | POA: Diagnosis not present

## 2018-02-10 DIAGNOSIS — E872 Acidosis: Secondary | ICD-10-CM | POA: Diagnosis not present

## 2018-02-10 DIAGNOSIS — D631 Anemia in chronic kidney disease: Secondary | ICD-10-CM | POA: Diagnosis not present

## 2018-02-10 DIAGNOSIS — L89152 Pressure ulcer of sacral region, stage 2: Secondary | ICD-10-CM | POA: Diagnosis not present

## 2018-02-10 DIAGNOSIS — E875 Hyperkalemia: Secondary | ICD-10-CM | POA: Diagnosis not present

## 2018-02-10 DIAGNOSIS — D649 Anemia, unspecified: Secondary | ICD-10-CM | POA: Diagnosis not present

## 2018-02-13 DIAGNOSIS — N186 End stage renal disease: Secondary | ICD-10-CM | POA: Diagnosis not present

## 2018-02-13 DIAGNOSIS — D631 Anemia in chronic kidney disease: Secondary | ICD-10-CM | POA: Diagnosis not present

## 2018-02-15 DIAGNOSIS — D631 Anemia in chronic kidney disease: Secondary | ICD-10-CM | POA: Diagnosis not present

## 2018-02-15 DIAGNOSIS — N186 End stage renal disease: Secondary | ICD-10-CM | POA: Diagnosis not present

## 2018-02-17 DIAGNOSIS — D631 Anemia in chronic kidney disease: Secondary | ICD-10-CM | POA: Diagnosis not present

## 2018-02-17 DIAGNOSIS — N186 End stage renal disease: Secondary | ICD-10-CM | POA: Diagnosis not present

## 2018-02-20 DIAGNOSIS — D631 Anemia in chronic kidney disease: Secondary | ICD-10-CM | POA: Diagnosis not present

## 2018-02-20 DIAGNOSIS — N186 End stage renal disease: Secondary | ICD-10-CM | POA: Diagnosis not present

## 2018-02-22 DIAGNOSIS — D631 Anemia in chronic kidney disease: Secondary | ICD-10-CM | POA: Diagnosis not present

## 2018-02-22 DIAGNOSIS — N186 End stage renal disease: Secondary | ICD-10-CM | POA: Diagnosis not present

## 2018-02-22 DIAGNOSIS — Z992 Dependence on renal dialysis: Secondary | ICD-10-CM | POA: Diagnosis not present

## 2018-02-24 DIAGNOSIS — N186 End stage renal disease: Secondary | ICD-10-CM | POA: Diagnosis not present

## 2018-02-24 DIAGNOSIS — D631 Anemia in chronic kidney disease: Secondary | ICD-10-CM | POA: Diagnosis not present

## 2018-02-24 DIAGNOSIS — E872 Acidosis: Secondary | ICD-10-CM | POA: Diagnosis not present

## 2018-02-27 DIAGNOSIS — N186 End stage renal disease: Secondary | ICD-10-CM | POA: Diagnosis not present

## 2018-02-27 DIAGNOSIS — D631 Anemia in chronic kidney disease: Secondary | ICD-10-CM | POA: Diagnosis not present

## 2018-02-27 DIAGNOSIS — E872 Acidosis: Secondary | ICD-10-CM | POA: Diagnosis not present

## 2018-03-01 DIAGNOSIS — E872 Acidosis: Secondary | ICD-10-CM | POA: Diagnosis not present

## 2018-03-01 DIAGNOSIS — N186 End stage renal disease: Secondary | ICD-10-CM | POA: Diagnosis not present

## 2018-03-01 DIAGNOSIS — D631 Anemia in chronic kidney disease: Secondary | ICD-10-CM | POA: Diagnosis not present

## 2018-03-03 DIAGNOSIS — E872 Acidosis: Secondary | ICD-10-CM | POA: Diagnosis not present

## 2018-03-03 DIAGNOSIS — N186 End stage renal disease: Secondary | ICD-10-CM | POA: Diagnosis not present

## 2018-03-03 DIAGNOSIS — D631 Anemia in chronic kidney disease: Secondary | ICD-10-CM | POA: Diagnosis not present

## 2018-03-06 DIAGNOSIS — E872 Acidosis: Secondary | ICD-10-CM | POA: Diagnosis not present

## 2018-03-06 DIAGNOSIS — D631 Anemia in chronic kidney disease: Secondary | ICD-10-CM | POA: Diagnosis not present

## 2018-03-06 DIAGNOSIS — N186 End stage renal disease: Secondary | ICD-10-CM | POA: Diagnosis not present

## 2018-03-08 DIAGNOSIS — D631 Anemia in chronic kidney disease: Secondary | ICD-10-CM | POA: Diagnosis not present

## 2018-03-08 DIAGNOSIS — E872 Acidosis: Secondary | ICD-10-CM | POA: Diagnosis not present

## 2018-03-08 DIAGNOSIS — N186 End stage renal disease: Secondary | ICD-10-CM | POA: Diagnosis not present

## 2018-03-09 DIAGNOSIS — I70213 Atherosclerosis of native arteries of extremities with intermittent claudication, bilateral legs: Secondary | ICD-10-CM | POA: Diagnosis not present

## 2018-03-09 DIAGNOSIS — B351 Tinea unguium: Secondary | ICD-10-CM | POA: Diagnosis not present

## 2018-03-09 DIAGNOSIS — Z89431 Acquired absence of right foot: Secondary | ICD-10-CM | POA: Diagnosis not present

## 2018-03-10 DIAGNOSIS — N186 End stage renal disease: Secondary | ICD-10-CM | POA: Diagnosis not present

## 2018-03-10 DIAGNOSIS — E872 Acidosis: Secondary | ICD-10-CM | POA: Diagnosis not present

## 2018-03-10 DIAGNOSIS — D631 Anemia in chronic kidney disease: Secondary | ICD-10-CM | POA: Diagnosis not present

## 2018-03-13 DIAGNOSIS — E872 Acidosis: Secondary | ICD-10-CM | POA: Diagnosis not present

## 2018-03-13 DIAGNOSIS — N186 End stage renal disease: Secondary | ICD-10-CM | POA: Diagnosis not present

## 2018-03-13 DIAGNOSIS — D631 Anemia in chronic kidney disease: Secondary | ICD-10-CM | POA: Diagnosis not present

## 2018-03-15 DIAGNOSIS — E872 Acidosis: Secondary | ICD-10-CM | POA: Diagnosis not present

## 2018-03-15 DIAGNOSIS — N186 End stage renal disease: Secondary | ICD-10-CM | POA: Diagnosis not present

## 2018-03-15 DIAGNOSIS — D631 Anemia in chronic kidney disease: Secondary | ICD-10-CM | POA: Diagnosis not present

## 2018-03-17 DIAGNOSIS — E872 Acidosis: Secondary | ICD-10-CM | POA: Diagnosis not present

## 2018-03-17 DIAGNOSIS — N186 End stage renal disease: Secondary | ICD-10-CM | POA: Diagnosis not present

## 2018-03-17 DIAGNOSIS — D631 Anemia in chronic kidney disease: Secondary | ICD-10-CM | POA: Diagnosis not present

## 2018-03-20 DIAGNOSIS — E872 Acidosis: Secondary | ICD-10-CM | POA: Diagnosis not present

## 2018-03-20 DIAGNOSIS — N186 End stage renal disease: Secondary | ICD-10-CM | POA: Diagnosis not present

## 2018-03-20 DIAGNOSIS — D631 Anemia in chronic kidney disease: Secondary | ICD-10-CM | POA: Diagnosis not present

## 2018-03-22 DIAGNOSIS — N186 End stage renal disease: Secondary | ICD-10-CM | POA: Diagnosis not present

## 2018-03-22 DIAGNOSIS — E872 Acidosis: Secondary | ICD-10-CM | POA: Diagnosis not present

## 2018-03-22 DIAGNOSIS — D631 Anemia in chronic kidney disease: Secondary | ICD-10-CM | POA: Diagnosis not present

## 2018-03-24 DIAGNOSIS — D631 Anemia in chronic kidney disease: Secondary | ICD-10-CM | POA: Diagnosis not present

## 2018-03-24 DIAGNOSIS — E872 Acidosis: Secondary | ICD-10-CM | POA: Diagnosis not present

## 2018-03-24 DIAGNOSIS — N186 End stage renal disease: Secondary | ICD-10-CM | POA: Diagnosis not present

## 2018-03-25 DIAGNOSIS — N186 End stage renal disease: Secondary | ICD-10-CM | POA: Diagnosis not present

## 2018-03-25 DIAGNOSIS — Z992 Dependence on renal dialysis: Secondary | ICD-10-CM | POA: Diagnosis not present

## 2018-03-28 DIAGNOSIS — N186 End stage renal disease: Secondary | ICD-10-CM | POA: Diagnosis not present

## 2018-03-28 DIAGNOSIS — N185 Chronic kidney disease, stage 5: Secondary | ICD-10-CM | POA: Diagnosis not present

## 2018-03-28 DIAGNOSIS — D631 Anemia in chronic kidney disease: Secondary | ICD-10-CM | POA: Diagnosis not present

## 2018-03-28 DIAGNOSIS — I10 Essential (primary) hypertension: Secondary | ICD-10-CM | POA: Diagnosis not present

## 2018-03-28 DIAGNOSIS — T82858A Stenosis of vascular prosthetic devices, implants and grafts, initial encounter: Secondary | ICD-10-CM | POA: Diagnosis not present

## 2018-03-28 DIAGNOSIS — E876 Hypokalemia: Secondary | ICD-10-CM | POA: Diagnosis not present

## 2018-03-28 DIAGNOSIS — M8589 Other specified disorders of bone density and structure, multiple sites: Secondary | ICD-10-CM | POA: Diagnosis not present

## 2018-03-28 DIAGNOSIS — I120 Hypertensive chronic kidney disease with stage 5 chronic kidney disease or end stage renal disease: Secondary | ICD-10-CM | POA: Diagnosis not present

## 2018-03-28 DIAGNOSIS — E875 Hyperkalemia: Secondary | ICD-10-CM | POA: Diagnosis not present

## 2018-03-28 DIAGNOSIS — R0602 Shortness of breath: Secondary | ICD-10-CM | POA: Diagnosis not present

## 2018-03-28 DIAGNOSIS — Z992 Dependence on renal dialysis: Secondary | ICD-10-CM | POA: Diagnosis not present

## 2018-03-28 DIAGNOSIS — N250 Renal osteodystrophy: Secondary | ICD-10-CM | POA: Diagnosis not present

## 2018-03-28 DIAGNOSIS — E8770 Fluid overload, unspecified: Secondary | ICD-10-CM | POA: Diagnosis not present

## 2018-03-28 DIAGNOSIS — R05 Cough: Secondary | ICD-10-CM | POA: Diagnosis not present

## 2018-04-03 DIAGNOSIS — R112 Nausea with vomiting, unspecified: Secondary | ICD-10-CM | POA: Diagnosis not present

## 2018-04-03 DIAGNOSIS — D631 Anemia in chronic kidney disease: Secondary | ICD-10-CM | POA: Diagnosis not present

## 2018-04-03 DIAGNOSIS — N2581 Secondary hyperparathyroidism of renal origin: Secondary | ICD-10-CM | POA: Diagnosis not present

## 2018-04-03 DIAGNOSIS — N186 End stage renal disease: Secondary | ICD-10-CM | POA: Diagnosis not present

## 2018-04-05 DIAGNOSIS — D631 Anemia in chronic kidney disease: Secondary | ICD-10-CM | POA: Diagnosis not present

## 2018-04-05 DIAGNOSIS — N2581 Secondary hyperparathyroidism of renal origin: Secondary | ICD-10-CM | POA: Diagnosis not present

## 2018-04-05 DIAGNOSIS — R112 Nausea with vomiting, unspecified: Secondary | ICD-10-CM | POA: Diagnosis not present

## 2018-04-05 DIAGNOSIS — N186 End stage renal disease: Secondary | ICD-10-CM | POA: Diagnosis not present

## 2018-04-07 DIAGNOSIS — D631 Anemia in chronic kidney disease: Secondary | ICD-10-CM | POA: Diagnosis not present

## 2018-04-07 DIAGNOSIS — R112 Nausea with vomiting, unspecified: Secondary | ICD-10-CM | POA: Diagnosis not present

## 2018-04-07 DIAGNOSIS — N186 End stage renal disease: Secondary | ICD-10-CM | POA: Diagnosis not present

## 2018-04-07 DIAGNOSIS — N2581 Secondary hyperparathyroidism of renal origin: Secondary | ICD-10-CM | POA: Diagnosis not present

## 2018-04-10 DIAGNOSIS — D631 Anemia in chronic kidney disease: Secondary | ICD-10-CM | POA: Diagnosis not present

## 2018-04-10 DIAGNOSIS — R112 Nausea with vomiting, unspecified: Secondary | ICD-10-CM | POA: Diagnosis not present

## 2018-04-10 DIAGNOSIS — N2581 Secondary hyperparathyroidism of renal origin: Secondary | ICD-10-CM | POA: Diagnosis not present

## 2018-04-10 DIAGNOSIS — N186 End stage renal disease: Secondary | ICD-10-CM | POA: Diagnosis not present

## 2018-04-12 DIAGNOSIS — R112 Nausea with vomiting, unspecified: Secondary | ICD-10-CM | POA: Diagnosis not present

## 2018-04-12 DIAGNOSIS — D631 Anemia in chronic kidney disease: Secondary | ICD-10-CM | POA: Diagnosis not present

## 2018-04-12 DIAGNOSIS — N186 End stage renal disease: Secondary | ICD-10-CM | POA: Diagnosis not present

## 2018-04-12 DIAGNOSIS — N2581 Secondary hyperparathyroidism of renal origin: Secondary | ICD-10-CM | POA: Diagnosis not present

## 2018-04-17 DIAGNOSIS — N186 End stage renal disease: Secondary | ICD-10-CM | POA: Diagnosis not present

## 2018-04-17 DIAGNOSIS — D631 Anemia in chronic kidney disease: Secondary | ICD-10-CM | POA: Diagnosis not present

## 2018-04-17 DIAGNOSIS — R112 Nausea with vomiting, unspecified: Secondary | ICD-10-CM | POA: Diagnosis not present

## 2018-04-17 DIAGNOSIS — N2581 Secondary hyperparathyroidism of renal origin: Secondary | ICD-10-CM | POA: Diagnosis not present

## 2018-04-19 DIAGNOSIS — N2581 Secondary hyperparathyroidism of renal origin: Secondary | ICD-10-CM | POA: Diagnosis not present

## 2018-04-19 DIAGNOSIS — D631 Anemia in chronic kidney disease: Secondary | ICD-10-CM | POA: Diagnosis not present

## 2018-04-19 DIAGNOSIS — N186 End stage renal disease: Secondary | ICD-10-CM | POA: Diagnosis not present

## 2018-04-19 DIAGNOSIS — R112 Nausea with vomiting, unspecified: Secondary | ICD-10-CM | POA: Diagnosis not present

## 2018-04-21 DIAGNOSIS — D631 Anemia in chronic kidney disease: Secondary | ICD-10-CM | POA: Diagnosis not present

## 2018-04-21 DIAGNOSIS — N186 End stage renal disease: Secondary | ICD-10-CM | POA: Diagnosis not present

## 2018-04-21 DIAGNOSIS — N2581 Secondary hyperparathyroidism of renal origin: Secondary | ICD-10-CM | POA: Diagnosis not present

## 2018-04-21 DIAGNOSIS — R112 Nausea with vomiting, unspecified: Secondary | ICD-10-CM | POA: Diagnosis not present

## 2018-04-24 DIAGNOSIS — D631 Anemia in chronic kidney disease: Secondary | ICD-10-CM | POA: Diagnosis not present

## 2018-04-24 DIAGNOSIS — N2581 Secondary hyperparathyroidism of renal origin: Secondary | ICD-10-CM | POA: Diagnosis not present

## 2018-04-24 DIAGNOSIS — N186 End stage renal disease: Secondary | ICD-10-CM | POA: Diagnosis not present

## 2018-04-24 DIAGNOSIS — R112 Nausea with vomiting, unspecified: Secondary | ICD-10-CM | POA: Diagnosis not present

## 2018-04-25 DIAGNOSIS — Z992 Dependence on renal dialysis: Secondary | ICD-10-CM | POA: Diagnosis not present

## 2018-04-25 DIAGNOSIS — N186 End stage renal disease: Secondary | ICD-10-CM | POA: Diagnosis not present

## 2018-04-25 DIAGNOSIS — I129 Hypertensive chronic kidney disease with stage 1 through stage 4 chronic kidney disease, or unspecified chronic kidney disease: Secondary | ICD-10-CM | POA: Diagnosis not present

## 2018-04-26 DIAGNOSIS — N186 End stage renal disease: Secondary | ICD-10-CM | POA: Diagnosis not present

## 2018-04-26 DIAGNOSIS — D631 Anemia in chronic kidney disease: Secondary | ICD-10-CM | POA: Diagnosis not present

## 2018-04-26 DIAGNOSIS — R112 Nausea with vomiting, unspecified: Secondary | ICD-10-CM | POA: Diagnosis not present

## 2018-04-26 DIAGNOSIS — N2581 Secondary hyperparathyroidism of renal origin: Secondary | ICD-10-CM | POA: Diagnosis not present

## 2018-05-01 DIAGNOSIS — D631 Anemia in chronic kidney disease: Secondary | ICD-10-CM | POA: Diagnosis not present

## 2018-05-01 DIAGNOSIS — R112 Nausea with vomiting, unspecified: Secondary | ICD-10-CM | POA: Diagnosis not present

## 2018-05-01 DIAGNOSIS — N2581 Secondary hyperparathyroidism of renal origin: Secondary | ICD-10-CM | POA: Diagnosis not present

## 2018-05-01 DIAGNOSIS — N186 End stage renal disease: Secondary | ICD-10-CM | POA: Diagnosis not present

## 2018-05-03 DIAGNOSIS — R112 Nausea with vomiting, unspecified: Secondary | ICD-10-CM | POA: Diagnosis not present

## 2018-05-03 DIAGNOSIS — D631 Anemia in chronic kidney disease: Secondary | ICD-10-CM | POA: Diagnosis not present

## 2018-05-03 DIAGNOSIS — N186 End stage renal disease: Secondary | ICD-10-CM | POA: Diagnosis not present

## 2018-05-03 DIAGNOSIS — N2581 Secondary hyperparathyroidism of renal origin: Secondary | ICD-10-CM | POA: Diagnosis not present

## 2018-05-05 DIAGNOSIS — R112 Nausea with vomiting, unspecified: Secondary | ICD-10-CM | POA: Diagnosis not present

## 2018-05-05 DIAGNOSIS — N2581 Secondary hyperparathyroidism of renal origin: Secondary | ICD-10-CM | POA: Diagnosis not present

## 2018-05-05 DIAGNOSIS — N186 End stage renal disease: Secondary | ICD-10-CM | POA: Diagnosis not present

## 2018-05-05 DIAGNOSIS — D631 Anemia in chronic kidney disease: Secondary | ICD-10-CM | POA: Diagnosis not present

## 2018-05-08 DIAGNOSIS — D631 Anemia in chronic kidney disease: Secondary | ICD-10-CM | POA: Diagnosis not present

## 2018-05-08 DIAGNOSIS — N186 End stage renal disease: Secondary | ICD-10-CM | POA: Diagnosis not present

## 2018-05-08 DIAGNOSIS — R112 Nausea with vomiting, unspecified: Secondary | ICD-10-CM | POA: Diagnosis not present

## 2018-05-08 DIAGNOSIS — N2581 Secondary hyperparathyroidism of renal origin: Secondary | ICD-10-CM | POA: Diagnosis not present

## 2018-05-10 DIAGNOSIS — N2581 Secondary hyperparathyroidism of renal origin: Secondary | ICD-10-CM | POA: Diagnosis not present

## 2018-05-10 DIAGNOSIS — D631 Anemia in chronic kidney disease: Secondary | ICD-10-CM | POA: Diagnosis not present

## 2018-05-10 DIAGNOSIS — R112 Nausea with vomiting, unspecified: Secondary | ICD-10-CM | POA: Diagnosis not present

## 2018-05-10 DIAGNOSIS — N186 End stage renal disease: Secondary | ICD-10-CM | POA: Diagnosis not present

## 2018-05-14 DIAGNOSIS — N2581 Secondary hyperparathyroidism of renal origin: Secondary | ICD-10-CM | POA: Diagnosis not present

## 2018-05-14 DIAGNOSIS — R112 Nausea with vomiting, unspecified: Secondary | ICD-10-CM | POA: Diagnosis not present

## 2018-05-14 DIAGNOSIS — N186 End stage renal disease: Secondary | ICD-10-CM | POA: Diagnosis not present

## 2018-05-14 DIAGNOSIS — D631 Anemia in chronic kidney disease: Secondary | ICD-10-CM | POA: Diagnosis not present

## 2018-05-16 DIAGNOSIS — N186 End stage renal disease: Secondary | ICD-10-CM | POA: Diagnosis not present

## 2018-05-16 DIAGNOSIS — R112 Nausea with vomiting, unspecified: Secondary | ICD-10-CM | POA: Diagnosis not present

## 2018-05-16 DIAGNOSIS — D631 Anemia in chronic kidney disease: Secondary | ICD-10-CM | POA: Diagnosis not present

## 2018-05-16 DIAGNOSIS — N2581 Secondary hyperparathyroidism of renal origin: Secondary | ICD-10-CM | POA: Diagnosis not present

## 2018-05-18 DIAGNOSIS — N186 End stage renal disease: Secondary | ICD-10-CM | POA: Diagnosis not present

## 2018-05-18 DIAGNOSIS — N2581 Secondary hyperparathyroidism of renal origin: Secondary | ICD-10-CM | POA: Diagnosis not present

## 2018-05-18 DIAGNOSIS — D631 Anemia in chronic kidney disease: Secondary | ICD-10-CM | POA: Diagnosis not present

## 2018-05-18 DIAGNOSIS — R112 Nausea with vomiting, unspecified: Secondary | ICD-10-CM | POA: Diagnosis not present

## 2018-05-21 DIAGNOSIS — N186 End stage renal disease: Secondary | ICD-10-CM | POA: Diagnosis not present

## 2018-05-21 DIAGNOSIS — N2581 Secondary hyperparathyroidism of renal origin: Secondary | ICD-10-CM | POA: Diagnosis not present

## 2018-05-21 DIAGNOSIS — D631 Anemia in chronic kidney disease: Secondary | ICD-10-CM | POA: Diagnosis not present

## 2018-05-21 DIAGNOSIS — R112 Nausea with vomiting, unspecified: Secondary | ICD-10-CM | POA: Diagnosis not present

## 2018-05-23 DIAGNOSIS — N2581 Secondary hyperparathyroidism of renal origin: Secondary | ICD-10-CM | POA: Diagnosis not present

## 2018-05-23 DIAGNOSIS — D631 Anemia in chronic kidney disease: Secondary | ICD-10-CM | POA: Diagnosis not present

## 2018-05-23 DIAGNOSIS — N186 End stage renal disease: Secondary | ICD-10-CM | POA: Diagnosis not present

## 2018-05-23 DIAGNOSIS — R112 Nausea with vomiting, unspecified: Secondary | ICD-10-CM | POA: Diagnosis not present

## 2018-05-28 DIAGNOSIS — D631 Anemia in chronic kidney disease: Secondary | ICD-10-CM | POA: Diagnosis not present

## 2018-05-28 DIAGNOSIS — N186 End stage renal disease: Secondary | ICD-10-CM | POA: Diagnosis not present

## 2018-05-28 DIAGNOSIS — R112 Nausea with vomiting, unspecified: Secondary | ICD-10-CM | POA: Diagnosis not present

## 2018-05-28 DIAGNOSIS — N2581 Secondary hyperparathyroidism of renal origin: Secondary | ICD-10-CM | POA: Diagnosis not present

## 2018-05-30 DIAGNOSIS — N2581 Secondary hyperparathyroidism of renal origin: Secondary | ICD-10-CM | POA: Diagnosis not present

## 2018-05-30 DIAGNOSIS — D631 Anemia in chronic kidney disease: Secondary | ICD-10-CM | POA: Diagnosis not present

## 2018-05-30 DIAGNOSIS — N186 End stage renal disease: Secondary | ICD-10-CM | POA: Diagnosis not present

## 2018-05-30 DIAGNOSIS — R112 Nausea with vomiting, unspecified: Secondary | ICD-10-CM | POA: Diagnosis not present

## 2018-06-01 DIAGNOSIS — N186 End stage renal disease: Secondary | ICD-10-CM | POA: Diagnosis not present

## 2018-06-01 DIAGNOSIS — N2581 Secondary hyperparathyroidism of renal origin: Secondary | ICD-10-CM | POA: Diagnosis not present

## 2018-06-01 DIAGNOSIS — R112 Nausea with vomiting, unspecified: Secondary | ICD-10-CM | POA: Diagnosis not present

## 2018-06-01 DIAGNOSIS — D631 Anemia in chronic kidney disease: Secondary | ICD-10-CM | POA: Diagnosis not present

## 2018-06-04 DIAGNOSIS — D631 Anemia in chronic kidney disease: Secondary | ICD-10-CM | POA: Diagnosis not present

## 2018-06-04 DIAGNOSIS — N186 End stage renal disease: Secondary | ICD-10-CM | POA: Diagnosis not present

## 2018-06-04 DIAGNOSIS — R112 Nausea with vomiting, unspecified: Secondary | ICD-10-CM | POA: Diagnosis not present

## 2018-06-04 DIAGNOSIS — N2581 Secondary hyperparathyroidism of renal origin: Secondary | ICD-10-CM | POA: Diagnosis not present

## 2018-06-08 DIAGNOSIS — D631 Anemia in chronic kidney disease: Secondary | ICD-10-CM | POA: Diagnosis not present

## 2018-06-08 DIAGNOSIS — R112 Nausea with vomiting, unspecified: Secondary | ICD-10-CM | POA: Diagnosis not present

## 2018-06-08 DIAGNOSIS — N186 End stage renal disease: Secondary | ICD-10-CM | POA: Diagnosis not present

## 2018-06-08 DIAGNOSIS — N2581 Secondary hyperparathyroidism of renal origin: Secondary | ICD-10-CM | POA: Diagnosis not present

## 2018-06-11 DIAGNOSIS — N186 End stage renal disease: Secondary | ICD-10-CM | POA: Diagnosis not present

## 2018-06-11 DIAGNOSIS — N2581 Secondary hyperparathyroidism of renal origin: Secondary | ICD-10-CM | POA: Diagnosis not present

## 2018-06-11 DIAGNOSIS — D631 Anemia in chronic kidney disease: Secondary | ICD-10-CM | POA: Diagnosis not present

## 2018-06-11 DIAGNOSIS — R112 Nausea with vomiting, unspecified: Secondary | ICD-10-CM | POA: Diagnosis not present

## 2018-06-13 DIAGNOSIS — R112 Nausea with vomiting, unspecified: Secondary | ICD-10-CM | POA: Diagnosis not present

## 2018-06-13 DIAGNOSIS — N186 End stage renal disease: Secondary | ICD-10-CM | POA: Diagnosis not present

## 2018-06-13 DIAGNOSIS — D631 Anemia in chronic kidney disease: Secondary | ICD-10-CM | POA: Diagnosis not present

## 2018-06-13 DIAGNOSIS — N2581 Secondary hyperparathyroidism of renal origin: Secondary | ICD-10-CM | POA: Diagnosis not present

## 2018-06-15 DIAGNOSIS — N2581 Secondary hyperparathyroidism of renal origin: Secondary | ICD-10-CM | POA: Diagnosis not present

## 2018-06-15 DIAGNOSIS — D631 Anemia in chronic kidney disease: Secondary | ICD-10-CM | POA: Diagnosis not present

## 2018-06-15 DIAGNOSIS — R112 Nausea with vomiting, unspecified: Secondary | ICD-10-CM | POA: Diagnosis not present

## 2018-06-15 DIAGNOSIS — N186 End stage renal disease: Secondary | ICD-10-CM | POA: Diagnosis not present

## 2018-06-18 DIAGNOSIS — R112 Nausea with vomiting, unspecified: Secondary | ICD-10-CM | POA: Diagnosis not present

## 2018-06-18 DIAGNOSIS — D631 Anemia in chronic kidney disease: Secondary | ICD-10-CM | POA: Diagnosis not present

## 2018-06-18 DIAGNOSIS — N2581 Secondary hyperparathyroidism of renal origin: Secondary | ICD-10-CM | POA: Diagnosis not present

## 2018-06-18 DIAGNOSIS — N186 End stage renal disease: Secondary | ICD-10-CM | POA: Diagnosis not present

## 2018-06-22 DIAGNOSIS — D631 Anemia in chronic kidney disease: Secondary | ICD-10-CM | POA: Diagnosis not present

## 2018-06-22 DIAGNOSIS — N2581 Secondary hyperparathyroidism of renal origin: Secondary | ICD-10-CM | POA: Diagnosis not present

## 2018-06-22 DIAGNOSIS — N186 End stage renal disease: Secondary | ICD-10-CM | POA: Diagnosis not present

## 2018-06-22 DIAGNOSIS — R112 Nausea with vomiting, unspecified: Secondary | ICD-10-CM | POA: Diagnosis not present

## 2018-06-25 DIAGNOSIS — N2581 Secondary hyperparathyroidism of renal origin: Secondary | ICD-10-CM | POA: Diagnosis not present

## 2018-06-25 DIAGNOSIS — N186 End stage renal disease: Secondary | ICD-10-CM | POA: Diagnosis not present

## 2018-06-25 DIAGNOSIS — D631 Anemia in chronic kidney disease: Secondary | ICD-10-CM | POA: Diagnosis not present

## 2018-06-27 DIAGNOSIS — N186 End stage renal disease: Secondary | ICD-10-CM | POA: Diagnosis not present

## 2018-06-27 DIAGNOSIS — D631 Anemia in chronic kidney disease: Secondary | ICD-10-CM | POA: Diagnosis not present

## 2018-06-27 DIAGNOSIS — N2581 Secondary hyperparathyroidism of renal origin: Secondary | ICD-10-CM | POA: Diagnosis not present

## 2018-07-01 DIAGNOSIS — I120 Hypertensive chronic kidney disease with stage 5 chronic kidney disease or end stage renal disease: Secondary | ICD-10-CM | POA: Diagnosis not present

## 2018-07-01 DIAGNOSIS — R079 Chest pain, unspecified: Secondary | ICD-10-CM | POA: Diagnosis not present

## 2018-07-01 DIAGNOSIS — R102 Pelvic and perineal pain: Secondary | ICD-10-CM | POA: Diagnosis not present

## 2018-07-01 DIAGNOSIS — F17200 Nicotine dependence, unspecified, uncomplicated: Secondary | ICD-10-CM | POA: Diagnosis not present

## 2018-07-01 DIAGNOSIS — N189 Chronic kidney disease, unspecified: Secondary | ICD-10-CM | POA: Diagnosis not present

## 2018-07-02 DIAGNOSIS — I2510 Atherosclerotic heart disease of native coronary artery without angina pectoris: Secondary | ICD-10-CM | POA: Diagnosis not present

## 2018-07-02 DIAGNOSIS — Z992 Dependence on renal dialysis: Secondary | ICD-10-CM | POA: Diagnosis not present

## 2018-07-02 DIAGNOSIS — H548 Legal blindness, as defined in USA: Secondary | ICD-10-CM | POA: Diagnosis not present

## 2018-07-02 DIAGNOSIS — R079 Chest pain, unspecified: Secondary | ICD-10-CM | POA: Diagnosis not present

## 2018-07-02 DIAGNOSIS — F17210 Nicotine dependence, cigarettes, uncomplicated: Secondary | ICD-10-CM | POA: Diagnosis not present

## 2018-07-02 DIAGNOSIS — N189 Chronic kidney disease, unspecified: Secondary | ICD-10-CM | POA: Diagnosis not present

## 2018-07-02 DIAGNOSIS — I120 Hypertensive chronic kidney disease with stage 5 chronic kidney disease or end stage renal disease: Secondary | ICD-10-CM | POA: Diagnosis not present

## 2018-07-02 DIAGNOSIS — N186 End stage renal disease: Secondary | ICD-10-CM | POA: Diagnosis not present

## 2018-07-02 DIAGNOSIS — R0789 Other chest pain: Secondary | ICD-10-CM | POA: Diagnosis not present

## 2018-07-02 DIAGNOSIS — Z888 Allergy status to other drugs, medicaments and biological substances status: Secondary | ICD-10-CM | POA: Diagnosis not present

## 2018-07-02 DIAGNOSIS — R9431 Abnormal electrocardiogram [ECG] [EKG]: Secondary | ICD-10-CM | POA: Diagnosis not present

## 2018-07-03 DIAGNOSIS — R0789 Other chest pain: Secondary | ICD-10-CM | POA: Diagnosis not present

## 2018-07-03 DIAGNOSIS — I340 Nonrheumatic mitral (valve) insufficiency: Secondary | ICD-10-CM | POA: Diagnosis not present

## 2018-07-03 DIAGNOSIS — I2510 Atherosclerotic heart disease of native coronary artery without angina pectoris: Secondary | ICD-10-CM | POA: Diagnosis not present

## 2018-07-03 DIAGNOSIS — I361 Nonrheumatic tricuspid (valve) insufficiency: Secondary | ICD-10-CM | POA: Diagnosis not present

## 2018-07-03 DIAGNOSIS — N186 End stage renal disease: Secondary | ICD-10-CM | POA: Diagnosis not present

## 2018-07-03 DIAGNOSIS — R079 Chest pain, unspecified: Secondary | ICD-10-CM | POA: Diagnosis not present

## 2018-07-03 DIAGNOSIS — I120 Hypertensive chronic kidney disease with stage 5 chronic kidney disease or end stage renal disease: Secondary | ICD-10-CM | POA: Diagnosis not present

## 2018-07-03 DIAGNOSIS — F17210 Nicotine dependence, cigarettes, uncomplicated: Secondary | ICD-10-CM | POA: Diagnosis not present

## 2018-07-03 DIAGNOSIS — Z992 Dependence on renal dialysis: Secondary | ICD-10-CM | POA: Diagnosis not present

## 2018-07-04 DIAGNOSIS — I120 Hypertensive chronic kidney disease with stage 5 chronic kidney disease or end stage renal disease: Secondary | ICD-10-CM | POA: Diagnosis not present

## 2018-07-04 DIAGNOSIS — R079 Chest pain, unspecified: Secondary | ICD-10-CM | POA: Diagnosis not present

## 2018-07-04 DIAGNOSIS — R0789 Other chest pain: Secondary | ICD-10-CM | POA: Diagnosis not present

## 2018-07-04 DIAGNOSIS — Z992 Dependence on renal dialysis: Secondary | ICD-10-CM | POA: Diagnosis not present

## 2018-07-04 DIAGNOSIS — F17210 Nicotine dependence, cigarettes, uncomplicated: Secondary | ICD-10-CM | POA: Diagnosis not present

## 2018-07-04 DIAGNOSIS — I2510 Atherosclerotic heart disease of native coronary artery without angina pectoris: Secondary | ICD-10-CM | POA: Diagnosis not present

## 2018-07-04 DIAGNOSIS — N186 End stage renal disease: Secondary | ICD-10-CM | POA: Diagnosis not present

## 2018-07-06 DIAGNOSIS — N2581 Secondary hyperparathyroidism of renal origin: Secondary | ICD-10-CM | POA: Diagnosis not present

## 2018-07-06 DIAGNOSIS — D631 Anemia in chronic kidney disease: Secondary | ICD-10-CM | POA: Diagnosis not present

## 2018-07-06 DIAGNOSIS — N186 End stage renal disease: Secondary | ICD-10-CM | POA: Diagnosis not present

## 2018-07-06 DIAGNOSIS — E789 Disorder of lipoprotein metabolism, unspecified: Secondary | ICD-10-CM | POA: Diagnosis not present

## 2018-07-09 DIAGNOSIS — N2581 Secondary hyperparathyroidism of renal origin: Secondary | ICD-10-CM | POA: Diagnosis not present

## 2018-07-09 DIAGNOSIS — N186 End stage renal disease: Secondary | ICD-10-CM | POA: Diagnosis not present

## 2018-07-09 DIAGNOSIS — D631 Anemia in chronic kidney disease: Secondary | ICD-10-CM | POA: Diagnosis not present

## 2018-07-16 DIAGNOSIS — I129 Hypertensive chronic kidney disease with stage 1 through stage 4 chronic kidney disease, or unspecified chronic kidney disease: Secondary | ICD-10-CM | POA: Diagnosis not present

## 2018-07-16 DIAGNOSIS — Z992 Dependence on renal dialysis: Secondary | ICD-10-CM | POA: Diagnosis not present

## 2018-07-16 DIAGNOSIS — N186 End stage renal disease: Secondary | ICD-10-CM | POA: Diagnosis not present

## 2018-07-17 ENCOUNTER — Observation Stay (HOSPITAL_COMMUNITY)
Admission: EM | Admit: 2018-07-17 | Discharge: 2018-07-17 | Disposition: A | Discharge status: Home / Self Care | Payer: Medicare Other | Attending: Nephrology | Admitting: Nephrology

## 2018-07-17 ENCOUNTER — Encounter (HOSPITAL_COMMUNITY): Payer: Self-pay | Admitting: Emergency Medicine

## 2018-07-17 ENCOUNTER — Other Ambulatory Visit: Payer: Self-pay

## 2018-07-17 DIAGNOSIS — E875 Hyperkalemia: Secondary | ICD-10-CM | POA: Diagnosis not present

## 2018-07-17 DIAGNOSIS — Z992 Dependence on renal dialysis: Secondary | ICD-10-CM | POA: Diagnosis not present

## 2018-07-17 DIAGNOSIS — N186 End stage renal disease: Secondary | ICD-10-CM | POA: Insufficient documentation

## 2018-07-17 DIAGNOSIS — N19 Unspecified kidney failure: Secondary | ICD-10-CM | POA: Diagnosis not present

## 2018-07-17 DIAGNOSIS — I120 Hypertensive chronic kidney disease with stage 5 chronic kidney disease or end stage renal disease: Principal | ICD-10-CM | POA: Insufficient documentation

## 2018-07-17 HISTORY — DX: End stage renal disease: N18.6

## 2018-07-17 HISTORY — DX: Essential (primary) hypertension: I10

## 2018-07-17 LAB — CBC
HCT: 30.1 % — ABNORMAL LOW (ref 36.0–46.0)
Hemoglobin: 9.3 g/dL — ABNORMAL LOW (ref 12.0–15.0)
MCH: 29.2 pg (ref 26.0–34.0)
MCHC: 30.9 g/dL (ref 30.0–36.0)
MCV: 94.7 fL (ref 78.0–100.0)
Platelets: 182 10*3/uL (ref 150–400)
RBC: 3.18 MIL/uL — ABNORMAL LOW (ref 3.87–5.11)
RDW: 15.7 % — ABNORMAL HIGH (ref 11.5–15.5)
WBC: 5.4 10*3/uL (ref 4.0–10.5)

## 2018-07-17 LAB — COMPREHENSIVE METABOLIC PANEL
ALT: 8 U/L (ref 0–44)
AST: 10 U/L — ABNORMAL LOW (ref 15–41)
Albumin: 3.5 g/dL (ref 3.5–5.0)
Alkaline Phosphatase: 67 U/L (ref 38–126)
Anion gap: 21 — ABNORMAL HIGH (ref 5–15)
BUN: 129 mg/dL — ABNORMAL HIGH (ref 6–20)
CO2: 20 mmol/L — ABNORMAL LOW (ref 22–32)
Calcium: 9.4 mg/dL (ref 8.9–10.3)
Chloride: 96 mmol/L — ABNORMAL LOW (ref 98–111)
Creatinine, Ser: 23.26 mg/dL — ABNORMAL HIGH (ref 0.44–1.00)
GFR calc Af Amer: 2 mL/min — ABNORMAL LOW (ref >60)
GFR calc non Af Amer: 2 mL/min — ABNORMAL LOW (ref >60)
Glucose, Bld: 96 mg/dL (ref 70–99)
Potassium: 5.6 mmol/L — ABNORMAL HIGH (ref 3.5–5.1)
Sodium: 137 mmol/L (ref 135–145)
Total Bilirubin: 0.6 mg/dL (ref 0.3–1.2)
Total Protein: 6.5 g/dL (ref 6.5–8.1)

## 2018-07-17 LAB — LIPASE, BLOOD: Lipase: 33 U/L (ref 11–51)

## 2018-07-17 MED ORDER — SODIUM CHLORIDE 0.9 % IV SOLN: 100.0000 mL | INTRAVENOUS | Status: DC | PRN

## 2018-07-17 MED ORDER — PENTAFLUOROPROP-TETRAFLUOROETH EX AERO
1.0000 "application " | INHALATION_SPRAY | CUTANEOUS | Status: DC | PRN
  Filled 2018-07-17: qty 103.5

## 2018-07-17 MED ORDER — CHLORHEXIDINE GLUCONATE CLOTH 2 % EX PADS: 6.0000 | MEDICATED_PAD | Freq: Every day | CUTANEOUS | Status: DC

## 2018-07-17 MED ORDER — LIDOCAINE HCL (PF) 1 % IJ SOLN: 5.0000 mL | INTRAMUSCULAR | Status: DC | PRN

## 2018-07-17 MED ORDER — ACETAMINOPHEN 325 MG PO TABS
ORAL_TABLET | ORAL | Status: AC
  Administered 2018-07-17: 650 mg via ORAL
  Filled 2018-07-17: qty 2

## 2018-07-17 MED ORDER — LIDOCAINE-PRILOCAINE 2.5-2.5 % EX CREA
1.0000 "application " | TOPICAL_CREAM | CUTANEOUS | Status: DC | PRN
  Filled 2018-07-17: qty 5

## 2018-07-17 MED ORDER — ACETAMINOPHEN 325 MG PO TABS
650.0000 mg | ORAL_TABLET | Freq: Once | ORAL | Status: AC
  Administered 2018-07-17: 650 mg via ORAL

## 2018-07-17 NOTE — Care Management Note (Signed)
Case Management Note  CM consulted for dialysis appointment follow up issues.  CM called [facility] at [phone] who advised CM would need to speak to pt's old clinic manager directly for assistance as this is a permanent transfer not a traveling dialysis need.  CM spoke with [place] Potter, [phone], who advised the pt and her niece were already aware of what the pt needed to do.  She reports pt has missed 3 dialysis sessions and cannot start at a new clinic until she is dialyzed in the hospital setting. Pt additionally already has an appointment on 07/18/2018 at 3:30 with [facility] which she has to also been seen by prior to starting dialysis at the new clinic.  [place] stated once she is seen by the nephrologist tomorrow, either her or the [facility] will contact her on Thursday for dialysis start.  [facility] appointment reminder placed on AVS.  Updated Dr. Herbert Moors and Dr. Justin Mend.  No further CM needs noted at this time.  Danyla Wattley, Benjaman Lobe, RN 07/17/2018, 3:33 PM

## 2018-07-17 NOTE — ED Notes (Signed)
Renal diet tray ordered

## 2018-07-17 NOTE — ED Notes (Signed)
Got patient on the monitor did ekg shown to er doctor

## 2018-07-17 NOTE — Progress Notes (Signed)
Patient completed HD trmt without difficulty.  Discharge instCKAuctions reviewed including appt with [facility] tomorrow.  She has been on dialysis for 10 years and is familiar with post hd care.  Discharged home via wheelchair, transported by transport staff.  Niece is taking her home with her.

## 2018-07-17 NOTE — ED Triage Notes (Signed)
Patient reports that she was visiting the area, had coordinated with a local dialysis center, but when she went to have dialysis they were unable to accommodate her. Dialysis in left arm, MWF dialysis, last treatment was 07/09/18. Patient states she has been especially consciousness of her intake during travel and states she has not complaints at this time. Patient alert, oriented, and in no apparent distress at this time.

## 2018-07-17 NOTE — ED Provider Notes (Signed)
[facility] EMERGENCY DEPARTMENT Provider Note   CSN: [id number] Arrival date & time: 07/17/18  1113     History   Chief Complaint Chief Complaint  Patient presents with  . Follow-up    HPI Rachef Schildt is a 42 y.o. female.  42 yo F with a chief complaint of needing dialysis.  The patient is from out of town and visiting her cousin.  She thought that she made arrangements to have dialysis performed which when she went to the center today they were unable to dialyze her.  She denies any specific complaint.  States she has been limiting her fluids at home.  The history is provided by the patient.  Illness  This is a new problem. The current episode started more than 1 week ago. The problem occurs constantly. The problem has not changed since onset.Pertinent negatives include no chest pain, no headaches and no shortness of breath. Nothing aggravates the symptoms. Nothing relieves the symptoms. She has tried nothing for the symptoms. The treatment provided no relief.    Past Medical History:  Diagnosis Date  . ESRD (end stage renal disease) ([place])   . Hypertension     There are no active problems to display for this patient.   History reviewed. No pertinent surgical history.   OB History   None      Home Medications    Prior to Admission medications   Not on File    Family History No family history on file.  Social History Social History   Tobacco Use  . Smoking status: Not on file  Substance Use Topics  . Alcohol use: Not on file  . Drug use: Not on file     Allergies   Percocet [oxycodone-acetaminophen]   Review of Systems Review of Systems  Constitutional: Negative for chills and fever.  HENT: Negative for congestion and rhinorrhea.   Eyes: Negative for redness and visual disturbance.  Respiratory: Negative for shortness of breath and wheezing.   Cardiovascular: Negative for chest pain and palpitations.  Gastrointestinal:  Negative for nausea and vomiting.  Genitourinary: Negative for dysuria and urgency.  Musculoskeletal: Negative for arthralgias and myalgias.  Skin: Negative for pallor and wound.  Neurological: Negative for dizziness and headaches.     Physical Exam Updated Vital Signs BP (!) 142/67   Pulse 65   Temp 98.8 F (37.1 C) (Oral)   Resp 16   SpO2 100%   Physical Exam  Constitutional: She is oriented to person, place, and time. She appears well-developed and well-nourished. No distress.  HENT:  Head: Normocephalic and atraumatic.  Facial swelling  Eyes: Pupils are equal, round, and reactive to light. EOM are normal.  Neck: Normal range of motion. Neck supple.  Cardiovascular: Normal rate and regular rhythm. Exam reveals no gallop and no friction rub.  No murmur heard. Pulmonary/Chest: Effort normal. She has no wheezes. She has rales ( At the bases).  Abdominal: Soft. She exhibits no distension. There is no tenderness.  Musculoskeletal: She exhibits no edema or tenderness.  Left AV fistula with palpable thrill  Neurological: She is alert and oriented to person, place, and time.  Skin: Skin is warm and dry. She is not diaphoretic.  Psychiatric: She has a normal mood and affect. Her behavior is normal.  Nursing note and vitals reviewed.    ED Treatments / Results  Labs (all labs ordered are listed, but only abnormal results are displayed) Labs Reviewed  COMPREHENSIVE METABOLIC PANEL - Abnormal;  Notable for the following components:      Result Value   Potassium 5.6 (*)    Chloride 96 (*)    CO2 20 (*)    BUN 129 (*)    Creatinine, Ser 23.26 (*)    AST 10 (*)    GFR calc non Af Amer 2 (*)    GFR calc Af Amer 2 (*)    Anion gap 21 (*)    All other components within normal limits  CBC - Abnormal; Notable for the following components:   RBC 3.18 (*)    Hemoglobin 9.3 (*)    HCT 30.1 (*)    RDW 15.7 (*)    All other components within normal limits  LIPASE, BLOOD     EKG EKG Interpretation  Date/Time:  Tuesday July 17 2018 12:18:10 EDT Ventricular Rate:  65 PR Interval:    QRS Duration: 100 QT Interval:  445 QTC Calculation: 463 R Axis:   30 Text Interpretation:  Sinus rhythm Low voltage, precordial leads Consider anterior infarct No old tracing to compare Confirmed by Deno Etienne [phone]) on 07/17/2018 12:46:08 PM   Radiology No results found.  Procedures Procedures (including critical care time)  Medications Ordered in ED Medications  Chlorhexidine Gluconate Cloth 2 % PADS 6 each (has no administration in time range)     Initial Impression / Assessment and Plan / ED Course  I have reviewed the triage vital signs and the nursing notes.  Pertinent labs & imaging results that were available during my care of the patient were reviewed by me and considered in my medical decision making (see chart for details).     42 yo F with a chief complaint of needing dialysis.  The patient is visiting from out of town and tried to make arrangements but was unable to get dialysis today.  She has not had dialysis in the past week.  Medically she appears to be mildly fluid overloaded.  Her potassium is mildly elevated at 5.6.  Her BUN is 129 her creatinine is 23.  I discussed this with Dr. Justin Mend, nephrology recommended that she get dialysis today and he will arrange for that.  He asked me to consult social work so that the patient can have a center to get dialysis done while she remains in the area as she plans to stay here at least for the next week.  Case manager has set up follow up for patient, has an appointment with the nephrologist tomorrow at 330.  Will then go to 1 of 2 centers depending on availability.  Ok to discharge immediately post dialysis.   The patients results and plan were reviewed and discussed.   Any x-rays performed were independently reviewed by myself.   Differential diagnosis were considered with the presenting HPI.  Medications   Chlorhexidine Gluconate Cloth 2 % PADS 6 each (has no administration in time range)    Vitals:   07/17/18 1123 07/17/18 1230 07/17/18 1330 07/17/18 1400  BP: (!) 152/90 (!) 146/96 129/69 (!) 142/67  Pulse: 67 67 66 65  Resp: 16 15 16    Temp: 98.8 F (37.1 C)     TempSrc: Oral     SpO2: 100% 100% 100% 100%    Final diagnoses:  Uremia  Hyperkalemia    Admission/ observation were discussed with the admitting physician, patient and/or family and they are comfortable with the plan.    Final Clinical Impressions(s) / ED Diagnoses   Final diagnoses:  Uremia  Hyperkalemia    ED Discharge Orders    None       Deno Etienne, [place] 07/17/18 1526

## 2018-07-17 NOTE — Progress Notes (Signed)
CSW aware that pt is needing assistance with getting dialysis while in [place]. CSW spoke with pt at bedside and was informed that pt was set up to get dialysis with [facility] however was not able to due to accomodation error. CSW has spoken with RNCM who expressed working on this at this time. There are no further CSW needs. CSW will sign off.   Virgie Dad. Markelle Najarian, MSW, [facility] Clinical Social Worker [phone]

## 2018-07-18 LAB — HEPATITIS B SURFACE ANTIGEN: Hepatitis B Surface Ag: NEGATIVE

## 2018-07-19 LAB — HEPATITIS B SURFACE ANTIBODY,QUALITATIVE: Hep B S Ab: REACTIVE

## 2018-07-19 LAB — HEPATITIS B CORE ANTIBODY, IGM: Hep B C IgM: NEGATIVE

## 2018-07-20 DIAGNOSIS — N2581 Secondary hyperparathyroidism of renal origin: Secondary | ICD-10-CM | POA: Diagnosis not present

## 2018-07-20 DIAGNOSIS — N186 End stage renal disease: Secondary | ICD-10-CM | POA: Diagnosis not present

## 2018-07-23 DIAGNOSIS — N2581 Secondary hyperparathyroidism of renal origin: Secondary | ICD-10-CM | POA: Diagnosis not present

## 2018-07-23 DIAGNOSIS — N186 End stage renal disease: Secondary | ICD-10-CM | POA: Diagnosis not present

## 2018-07-25 DIAGNOSIS — N186 End stage renal disease: Secondary | ICD-10-CM | POA: Diagnosis not present

## 2018-07-25 DIAGNOSIS — N2581 Secondary hyperparathyroidism of renal origin: Secondary | ICD-10-CM | POA: Diagnosis not present

## 2018-07-26 DIAGNOSIS — I129 Hypertensive chronic kidney disease with stage 1 through stage 4 chronic kidney disease, or unspecified chronic kidney disease: Secondary | ICD-10-CM | POA: Diagnosis not present

## 2018-07-26 DIAGNOSIS — N186 End stage renal disease: Secondary | ICD-10-CM | POA: Diagnosis not present

## 2018-07-26 DIAGNOSIS — Z992 Dependence on renal dialysis: Secondary | ICD-10-CM | POA: Diagnosis not present

## 2018-07-27 DIAGNOSIS — E611 Iron deficiency: Secondary | ICD-10-CM | POA: Diagnosis not present

## 2018-07-27 DIAGNOSIS — N186 End stage renal disease: Secondary | ICD-10-CM | POA: Diagnosis not present

## 2018-07-27 DIAGNOSIS — D649 Anemia, unspecified: Secondary | ICD-10-CM | POA: Diagnosis not present

## 2018-07-27 DIAGNOSIS — N2581 Secondary hyperparathyroidism of renal origin: Secondary | ICD-10-CM | POA: Diagnosis not present

## 2018-07-30 DIAGNOSIS — E611 Iron deficiency: Secondary | ICD-10-CM | POA: Diagnosis not present

## 2018-07-30 DIAGNOSIS — N2581 Secondary hyperparathyroidism of renal origin: Secondary | ICD-10-CM | POA: Diagnosis not present

## 2018-07-30 DIAGNOSIS — D649 Anemia, unspecified: Secondary | ICD-10-CM | POA: Diagnosis not present

## 2018-07-30 DIAGNOSIS — N186 End stage renal disease: Secondary | ICD-10-CM | POA: Diagnosis not present

## 2018-08-01 DIAGNOSIS — N186 End stage renal disease: Secondary | ICD-10-CM | POA: Diagnosis not present

## 2018-08-01 DIAGNOSIS — E611 Iron deficiency: Secondary | ICD-10-CM | POA: Diagnosis not present

## 2018-08-01 DIAGNOSIS — D649 Anemia, unspecified: Secondary | ICD-10-CM | POA: Diagnosis not present

## 2018-08-01 DIAGNOSIS — N2581 Secondary hyperparathyroidism of renal origin: Secondary | ICD-10-CM | POA: Diagnosis not present

## 2018-08-03 DIAGNOSIS — D649 Anemia, unspecified: Secondary | ICD-10-CM | POA: Diagnosis not present

## 2018-08-03 DIAGNOSIS — N186 End stage renal disease: Secondary | ICD-10-CM | POA: Diagnosis not present

## 2018-08-03 DIAGNOSIS — E611 Iron deficiency: Secondary | ICD-10-CM | POA: Diagnosis not present

## 2018-08-03 DIAGNOSIS — N2581 Secondary hyperparathyroidism of renal origin: Secondary | ICD-10-CM | POA: Diagnosis not present

## 2018-08-06 DIAGNOSIS — D649 Anemia, unspecified: Secondary | ICD-10-CM | POA: Diagnosis not present

## 2018-08-06 DIAGNOSIS — E611 Iron deficiency: Secondary | ICD-10-CM | POA: Diagnosis not present

## 2018-08-06 DIAGNOSIS — N2581 Secondary hyperparathyroidism of renal origin: Secondary | ICD-10-CM | POA: Diagnosis not present

## 2018-08-06 DIAGNOSIS — N186 End stage renal disease: Secondary | ICD-10-CM | POA: Diagnosis not present

## 2018-08-08 DIAGNOSIS — N2581 Secondary hyperparathyroidism of renal origin: Secondary | ICD-10-CM | POA: Diagnosis not present

## 2018-08-08 DIAGNOSIS — D649 Anemia, unspecified: Secondary | ICD-10-CM | POA: Diagnosis not present

## 2018-08-08 DIAGNOSIS — E611 Iron deficiency: Secondary | ICD-10-CM | POA: Diagnosis not present

## 2018-08-08 DIAGNOSIS — N186 End stage renal disease: Secondary | ICD-10-CM | POA: Diagnosis not present

## 2018-08-10 DIAGNOSIS — E611 Iron deficiency: Secondary | ICD-10-CM | POA: Diagnosis not present

## 2018-08-10 DIAGNOSIS — N2581 Secondary hyperparathyroidism of renal origin: Secondary | ICD-10-CM | POA: Diagnosis not present

## 2018-08-10 DIAGNOSIS — N186 End stage renal disease: Secondary | ICD-10-CM | POA: Diagnosis not present

## 2018-08-10 DIAGNOSIS — D649 Anemia, unspecified: Secondary | ICD-10-CM | POA: Diagnosis not present

## 2018-08-13 DIAGNOSIS — N186 End stage renal disease: Secondary | ICD-10-CM | POA: Diagnosis not present

## 2018-08-13 DIAGNOSIS — N2581 Secondary hyperparathyroidism of renal origin: Secondary | ICD-10-CM | POA: Diagnosis not present

## 2018-08-13 DIAGNOSIS — D649 Anemia, unspecified: Secondary | ICD-10-CM | POA: Diagnosis not present

## 2018-08-13 DIAGNOSIS — E611 Iron deficiency: Secondary | ICD-10-CM | POA: Diagnosis not present

## 2018-08-15 DIAGNOSIS — N186 End stage renal disease: Secondary | ICD-10-CM | POA: Diagnosis not present

## 2018-08-15 DIAGNOSIS — D649 Anemia, unspecified: Secondary | ICD-10-CM | POA: Diagnosis not present

## 2018-08-15 DIAGNOSIS — N2581 Secondary hyperparathyroidism of renal origin: Secondary | ICD-10-CM | POA: Diagnosis not present

## 2018-08-15 DIAGNOSIS — E611 Iron deficiency: Secondary | ICD-10-CM | POA: Diagnosis not present

## 2018-08-17 DIAGNOSIS — N186 End stage renal disease: Secondary | ICD-10-CM | POA: Diagnosis not present

## 2018-08-17 DIAGNOSIS — E611 Iron deficiency: Secondary | ICD-10-CM | POA: Diagnosis not present

## 2018-08-17 DIAGNOSIS — N2581 Secondary hyperparathyroidism of renal origin: Secondary | ICD-10-CM | POA: Diagnosis not present

## 2018-08-17 DIAGNOSIS — D649 Anemia, unspecified: Secondary | ICD-10-CM | POA: Diagnosis not present

## 2018-08-20 DIAGNOSIS — E611 Iron deficiency: Secondary | ICD-10-CM | POA: Diagnosis not present

## 2018-08-20 DIAGNOSIS — N186 End stage renal disease: Secondary | ICD-10-CM | POA: Diagnosis not present

## 2018-08-20 DIAGNOSIS — N2581 Secondary hyperparathyroidism of renal origin: Secondary | ICD-10-CM | POA: Diagnosis not present

## 2018-08-20 DIAGNOSIS — D649 Anemia, unspecified: Secondary | ICD-10-CM | POA: Diagnosis not present

## 2018-08-22 DIAGNOSIS — N186 End stage renal disease: Secondary | ICD-10-CM | POA: Diagnosis not present

## 2018-08-22 DIAGNOSIS — D649 Anemia, unspecified: Secondary | ICD-10-CM | POA: Diagnosis not present

## 2018-08-22 DIAGNOSIS — E611 Iron deficiency: Secondary | ICD-10-CM | POA: Diagnosis not present

## 2018-08-22 DIAGNOSIS — N2581 Secondary hyperparathyroidism of renal origin: Secondary | ICD-10-CM | POA: Diagnosis not present

## 2018-08-24 DIAGNOSIS — E611 Iron deficiency: Secondary | ICD-10-CM | POA: Diagnosis not present

## 2018-08-24 DIAGNOSIS — N2581 Secondary hyperparathyroidism of renal origin: Secondary | ICD-10-CM | POA: Diagnosis not present

## 2018-08-24 DIAGNOSIS — D649 Anemia, unspecified: Secondary | ICD-10-CM | POA: Diagnosis not present

## 2018-08-24 DIAGNOSIS — N186 End stage renal disease: Secondary | ICD-10-CM | POA: Diagnosis not present

## 2018-08-26 DIAGNOSIS — Z992 Dependence on renal dialysis: Secondary | ICD-10-CM | POA: Diagnosis not present

## 2018-08-26 DIAGNOSIS — I129 Hypertensive chronic kidney disease with stage 1 through stage 4 chronic kidney disease, or unspecified chronic kidney disease: Secondary | ICD-10-CM | POA: Diagnosis not present

## 2018-08-26 DIAGNOSIS — N186 End stage renal disease: Secondary | ICD-10-CM | POA: Diagnosis not present

## 2018-08-27 DIAGNOSIS — E611 Iron deficiency: Secondary | ICD-10-CM | POA: Diagnosis not present

## 2018-08-27 DIAGNOSIS — D649 Anemia, unspecified: Secondary | ICD-10-CM | POA: Diagnosis not present

## 2018-08-27 DIAGNOSIS — N2581 Secondary hyperparathyroidism of renal origin: Secondary | ICD-10-CM | POA: Diagnosis not present

## 2018-08-27 DIAGNOSIS — N186 End stage renal disease: Secondary | ICD-10-CM | POA: Diagnosis not present

## 2018-08-29 DIAGNOSIS — E611 Iron deficiency: Secondary | ICD-10-CM | POA: Diagnosis not present

## 2018-08-29 DIAGNOSIS — N186 End stage renal disease: Secondary | ICD-10-CM | POA: Diagnosis not present

## 2018-08-29 DIAGNOSIS — N2581 Secondary hyperparathyroidism of renal origin: Secondary | ICD-10-CM | POA: Diagnosis not present

## 2018-08-29 DIAGNOSIS — D649 Anemia, unspecified: Secondary | ICD-10-CM | POA: Diagnosis not present

## 2018-08-30 DIAGNOSIS — E669 Obesity, unspecified: Secondary | ICD-10-CM | POA: Diagnosis not present

## 2018-08-30 DIAGNOSIS — Z131 Encounter for screening for diabetes mellitus: Secondary | ICD-10-CM | POA: Diagnosis not present

## 2018-08-30 DIAGNOSIS — Z87891 Personal history of nicotine dependence: Secondary | ICD-10-CM | POA: Diagnosis not present

## 2018-08-30 DIAGNOSIS — Z136 Encounter for screening for cardiovascular disorders: Secondary | ICD-10-CM | POA: Diagnosis not present

## 2018-08-30 DIAGNOSIS — I10 Essential (primary) hypertension: Secondary | ICD-10-CM | POA: Diagnosis not present

## 2018-08-30 DIAGNOSIS — Z01118 Encounter for examination of ears and hearing with other abnormal findings: Secondary | ICD-10-CM | POA: Diagnosis not present

## 2018-08-31 DIAGNOSIS — N2581 Secondary hyperparathyroidism of renal origin: Secondary | ICD-10-CM | POA: Diagnosis not present

## 2018-08-31 DIAGNOSIS — D649 Anemia, unspecified: Secondary | ICD-10-CM | POA: Diagnosis not present

## 2018-08-31 DIAGNOSIS — E611 Iron deficiency: Secondary | ICD-10-CM | POA: Diagnosis not present

## 2018-08-31 DIAGNOSIS — N186 End stage renal disease: Secondary | ICD-10-CM | POA: Diagnosis not present

## 2018-09-03 DIAGNOSIS — E611 Iron deficiency: Secondary | ICD-10-CM | POA: Diagnosis not present

## 2018-09-03 DIAGNOSIS — N186 End stage renal disease: Secondary | ICD-10-CM | POA: Diagnosis not present

## 2018-09-03 DIAGNOSIS — D649 Anemia, unspecified: Secondary | ICD-10-CM | POA: Diagnosis not present

## 2018-09-03 DIAGNOSIS — N2581 Secondary hyperparathyroidism of renal origin: Secondary | ICD-10-CM | POA: Diagnosis not present

## 2018-09-05 DIAGNOSIS — D649 Anemia, unspecified: Secondary | ICD-10-CM | POA: Diagnosis not present

## 2018-09-05 DIAGNOSIS — N186 End stage renal disease: Secondary | ICD-10-CM | POA: Diagnosis not present

## 2018-09-05 DIAGNOSIS — N2581 Secondary hyperparathyroidism of renal origin: Secondary | ICD-10-CM | POA: Diagnosis not present

## 2018-09-05 DIAGNOSIS — E611 Iron deficiency: Secondary | ICD-10-CM | POA: Diagnosis not present

## 2018-09-06 DIAGNOSIS — I120 Hypertensive chronic kidney disease with stage 5 chronic kidney disease or end stage renal disease: Secondary | ICD-10-CM | POA: Diagnosis not present

## 2018-09-06 DIAGNOSIS — N186 End stage renal disease: Secondary | ICD-10-CM | POA: Diagnosis not present

## 2018-09-07 DIAGNOSIS — I120 Hypertensive chronic kidney disease with stage 5 chronic kidney disease or end stage renal disease: Secondary | ICD-10-CM | POA: Diagnosis not present

## 2018-09-07 DIAGNOSIS — D649 Anemia, unspecified: Secondary | ICD-10-CM | POA: Diagnosis not present

## 2018-09-07 DIAGNOSIS — N186 End stage renal disease: Secondary | ICD-10-CM | POA: Diagnosis not present

## 2018-09-07 DIAGNOSIS — E611 Iron deficiency: Secondary | ICD-10-CM | POA: Diagnosis not present

## 2018-09-07 DIAGNOSIS — N2581 Secondary hyperparathyroidism of renal origin: Secondary | ICD-10-CM | POA: Diagnosis not present

## 2018-09-10 DIAGNOSIS — N2581 Secondary hyperparathyroidism of renal origin: Secondary | ICD-10-CM | POA: Diagnosis not present

## 2018-09-10 DIAGNOSIS — D649 Anemia, unspecified: Secondary | ICD-10-CM | POA: Diagnosis not present

## 2018-09-10 DIAGNOSIS — E611 Iron deficiency: Secondary | ICD-10-CM | POA: Diagnosis not present

## 2018-09-10 DIAGNOSIS — N186 End stage renal disease: Secondary | ICD-10-CM | POA: Diagnosis not present

## 2018-09-12 DIAGNOSIS — D649 Anemia, unspecified: Secondary | ICD-10-CM | POA: Diagnosis not present

## 2018-09-12 DIAGNOSIS — N2581 Secondary hyperparathyroidism of renal origin: Secondary | ICD-10-CM | POA: Diagnosis not present

## 2018-09-12 DIAGNOSIS — E611 Iron deficiency: Secondary | ICD-10-CM | POA: Diagnosis not present

## 2018-09-12 DIAGNOSIS — N186 End stage renal disease: Secondary | ICD-10-CM | POA: Diagnosis not present

## 2018-09-12 DIAGNOSIS — I120 Hypertensive chronic kidney disease with stage 5 chronic kidney disease or end stage renal disease: Secondary | ICD-10-CM | POA: Diagnosis not present

## 2018-09-13 DIAGNOSIS — Z01118 Encounter for examination of ears and hearing with other abnormal findings: Secondary | ICD-10-CM | POA: Diagnosis not present

## 2018-09-13 DIAGNOSIS — Z87891 Personal history of nicotine dependence: Secondary | ICD-10-CM | POA: Diagnosis not present

## 2018-09-13 DIAGNOSIS — Z0000 Encounter for general adult medical examination without abnormal findings: Secondary | ICD-10-CM | POA: Diagnosis not present

## 2018-09-13 DIAGNOSIS — E669 Obesity, unspecified: Secondary | ICD-10-CM | POA: Diagnosis not present

## 2018-09-13 DIAGNOSIS — I10 Essential (primary) hypertension: Secondary | ICD-10-CM | POA: Diagnosis not present

## 2018-09-14 DIAGNOSIS — D649 Anemia, unspecified: Secondary | ICD-10-CM | POA: Diagnosis not present

## 2018-09-14 DIAGNOSIS — N2581 Secondary hyperparathyroidism of renal origin: Secondary | ICD-10-CM | POA: Diagnosis not present

## 2018-09-14 DIAGNOSIS — N186 End stage renal disease: Secondary | ICD-10-CM | POA: Diagnosis not present

## 2018-09-14 DIAGNOSIS — E611 Iron deficiency: Secondary | ICD-10-CM | POA: Diagnosis not present

## 2018-09-17 DIAGNOSIS — D649 Anemia, unspecified: Secondary | ICD-10-CM | POA: Diagnosis not present

## 2018-09-17 DIAGNOSIS — N186 End stage renal disease: Secondary | ICD-10-CM | POA: Diagnosis not present

## 2018-09-17 DIAGNOSIS — N2581 Secondary hyperparathyroidism of renal origin: Secondary | ICD-10-CM | POA: Diagnosis not present

## 2018-09-17 DIAGNOSIS — E611 Iron deficiency: Secondary | ICD-10-CM | POA: Diagnosis not present

## 2018-09-19 DIAGNOSIS — N186 End stage renal disease: Secondary | ICD-10-CM | POA: Diagnosis not present

## 2018-09-19 DIAGNOSIS — N2581 Secondary hyperparathyroidism of renal origin: Secondary | ICD-10-CM | POA: Diagnosis not present

## 2018-09-19 DIAGNOSIS — D649 Anemia, unspecified: Secondary | ICD-10-CM | POA: Diagnosis not present

## 2018-09-19 DIAGNOSIS — E611 Iron deficiency: Secondary | ICD-10-CM | POA: Diagnosis not present

## 2018-09-20 DIAGNOSIS — N186 End stage renal disease: Secondary | ICD-10-CM | POA: Diagnosis not present

## 2018-09-20 DIAGNOSIS — I120 Hypertensive chronic kidney disease with stage 5 chronic kidney disease or end stage renal disease: Secondary | ICD-10-CM | POA: Diagnosis not present

## 2018-09-21 DIAGNOSIS — N2581 Secondary hyperparathyroidism of renal origin: Secondary | ICD-10-CM | POA: Diagnosis not present

## 2018-09-21 DIAGNOSIS — D649 Anemia, unspecified: Secondary | ICD-10-CM | POA: Diagnosis not present

## 2018-09-21 DIAGNOSIS — N186 End stage renal disease: Secondary | ICD-10-CM | POA: Diagnosis not present

## 2018-09-21 DIAGNOSIS — E611 Iron deficiency: Secondary | ICD-10-CM | POA: Diagnosis not present

## 2018-09-24 DIAGNOSIS — N186 End stage renal disease: Secondary | ICD-10-CM | POA: Diagnosis not present

## 2018-09-24 DIAGNOSIS — E611 Iron deficiency: Secondary | ICD-10-CM | POA: Diagnosis not present

## 2018-09-24 DIAGNOSIS — D649 Anemia, unspecified: Secondary | ICD-10-CM | POA: Diagnosis not present

## 2018-09-24 DIAGNOSIS — N2581 Secondary hyperparathyroidism of renal origin: Secondary | ICD-10-CM | POA: Diagnosis not present

## 2018-09-25 DIAGNOSIS — E669 Obesity, unspecified: Secondary | ICD-10-CM | POA: Diagnosis not present

## 2018-09-25 DIAGNOSIS — Z87891 Personal history of nicotine dependence: Secondary | ICD-10-CM | POA: Diagnosis not present

## 2018-09-25 DIAGNOSIS — I129 Hypertensive chronic kidney disease with stage 1 through stage 4 chronic kidney disease, or unspecified chronic kidney disease: Secondary | ICD-10-CM | POA: Diagnosis not present

## 2018-09-25 DIAGNOSIS — N186 End stage renal disease: Secondary | ICD-10-CM | POA: Diagnosis not present

## 2018-09-25 DIAGNOSIS — Z992 Dependence on renal dialysis: Secondary | ICD-10-CM | POA: Diagnosis not present

## 2018-09-25 DIAGNOSIS — Z2821 Immunization not carried out because of patient refusal: Secondary | ICD-10-CM | POA: Diagnosis not present

## 2018-09-25 DIAGNOSIS — I10 Essential (primary) hypertension: Secondary | ICD-10-CM | POA: Diagnosis not present

## 2018-09-26 DIAGNOSIS — D649 Anemia, unspecified: Secondary | ICD-10-CM | POA: Diagnosis not present

## 2018-09-26 DIAGNOSIS — N2581 Secondary hyperparathyroidism of renal origin: Secondary | ICD-10-CM | POA: Diagnosis not present

## 2018-09-26 DIAGNOSIS — N186 End stage renal disease: Secondary | ICD-10-CM | POA: Diagnosis not present

## 2018-09-27 DIAGNOSIS — N186 End stage renal disease: Secondary | ICD-10-CM | POA: Diagnosis not present

## 2018-09-27 DIAGNOSIS — I120 Hypertensive chronic kidney disease with stage 5 chronic kidney disease or end stage renal disease: Secondary | ICD-10-CM | POA: Diagnosis not present

## 2018-09-28 DIAGNOSIS — N2581 Secondary hyperparathyroidism of renal origin: Secondary | ICD-10-CM | POA: Diagnosis not present

## 2018-09-28 DIAGNOSIS — N186 End stage renal disease: Secondary | ICD-10-CM | POA: Diagnosis not present

## 2018-09-28 DIAGNOSIS — D649 Anemia, unspecified: Secondary | ICD-10-CM | POA: Diagnosis not present

## 2018-10-01 DIAGNOSIS — N186 End stage renal disease: Secondary | ICD-10-CM | POA: Diagnosis not present

## 2018-10-01 DIAGNOSIS — D649 Anemia, unspecified: Secondary | ICD-10-CM | POA: Diagnosis not present

## 2018-10-01 DIAGNOSIS — N2581 Secondary hyperparathyroidism of renal origin: Secondary | ICD-10-CM | POA: Diagnosis not present

## 2018-10-03 DIAGNOSIS — N2581 Secondary hyperparathyroidism of renal origin: Secondary | ICD-10-CM | POA: Diagnosis not present

## 2018-10-03 DIAGNOSIS — N186 End stage renal disease: Secondary | ICD-10-CM | POA: Diagnosis not present

## 2018-10-03 DIAGNOSIS — D649 Anemia, unspecified: Secondary | ICD-10-CM | POA: Diagnosis not present

## 2018-10-05 DIAGNOSIS — D649 Anemia, unspecified: Secondary | ICD-10-CM | POA: Diagnosis not present

## 2018-10-05 DIAGNOSIS — N2581 Secondary hyperparathyroidism of renal origin: Secondary | ICD-10-CM | POA: Diagnosis not present

## 2018-10-05 DIAGNOSIS — N186 End stage renal disease: Secondary | ICD-10-CM | POA: Diagnosis not present

## 2018-10-08 DIAGNOSIS — N2581 Secondary hyperparathyroidism of renal origin: Secondary | ICD-10-CM | POA: Diagnosis not present

## 2018-10-08 DIAGNOSIS — N186 End stage renal disease: Secondary | ICD-10-CM | POA: Diagnosis not present

## 2018-10-08 DIAGNOSIS — D649 Anemia, unspecified: Secondary | ICD-10-CM | POA: Diagnosis not present

## 2018-10-10 DIAGNOSIS — N2581 Secondary hyperparathyroidism of renal origin: Secondary | ICD-10-CM | POA: Diagnosis not present

## 2018-10-10 DIAGNOSIS — D649 Anemia, unspecified: Secondary | ICD-10-CM | POA: Diagnosis not present

## 2018-10-10 DIAGNOSIS — N186 End stage renal disease: Secondary | ICD-10-CM | POA: Diagnosis not present

## 2018-10-12 DIAGNOSIS — D649 Anemia, unspecified: Secondary | ICD-10-CM | POA: Diagnosis not present

## 2018-10-12 DIAGNOSIS — N2581 Secondary hyperparathyroidism of renal origin: Secondary | ICD-10-CM | POA: Diagnosis not present

## 2018-10-12 DIAGNOSIS — N186 End stage renal disease: Secondary | ICD-10-CM | POA: Diagnosis not present

## 2018-10-15 DIAGNOSIS — N2581 Secondary hyperparathyroidism of renal origin: Secondary | ICD-10-CM | POA: Diagnosis not present

## 2018-10-15 DIAGNOSIS — N186 End stage renal disease: Secondary | ICD-10-CM | POA: Diagnosis not present

## 2018-10-15 DIAGNOSIS — D649 Anemia, unspecified: Secondary | ICD-10-CM | POA: Diagnosis not present

## 2018-10-17 DIAGNOSIS — N2581 Secondary hyperparathyroidism of renal origin: Secondary | ICD-10-CM | POA: Diagnosis not present

## 2018-10-17 DIAGNOSIS — N186 End stage renal disease: Secondary | ICD-10-CM | POA: Diagnosis not present

## 2018-10-17 DIAGNOSIS — D649 Anemia, unspecified: Secondary | ICD-10-CM | POA: Diagnosis not present

## 2018-10-19 DIAGNOSIS — D649 Anemia, unspecified: Secondary | ICD-10-CM | POA: Diagnosis not present

## 2018-10-19 DIAGNOSIS — N2581 Secondary hyperparathyroidism of renal origin: Secondary | ICD-10-CM | POA: Diagnosis not present

## 2018-10-19 DIAGNOSIS — N186 End stage renal disease: Secondary | ICD-10-CM | POA: Diagnosis not present

## 2018-10-22 DIAGNOSIS — D649 Anemia, unspecified: Secondary | ICD-10-CM | POA: Diagnosis not present

## 2018-10-22 DIAGNOSIS — N2581 Secondary hyperparathyroidism of renal origin: Secondary | ICD-10-CM | POA: Diagnosis not present

## 2018-10-22 DIAGNOSIS — N186 End stage renal disease: Secondary | ICD-10-CM | POA: Diagnosis not present

## 2018-10-24 DIAGNOSIS — D649 Anemia, unspecified: Secondary | ICD-10-CM | POA: Diagnosis not present

## 2018-10-24 DIAGNOSIS — N2581 Secondary hyperparathyroidism of renal origin: Secondary | ICD-10-CM | POA: Diagnosis not present

## 2018-10-24 DIAGNOSIS — N186 End stage renal disease: Secondary | ICD-10-CM | POA: Diagnosis not present

## 2018-10-26 DIAGNOSIS — Z992 Dependence on renal dialysis: Secondary | ICD-10-CM | POA: Diagnosis not present

## 2018-10-26 DIAGNOSIS — N2581 Secondary hyperparathyroidism of renal origin: Secondary | ICD-10-CM | POA: Diagnosis not present

## 2018-10-26 DIAGNOSIS — I129 Hypertensive chronic kidney disease with stage 1 through stage 4 chronic kidney disease, or unspecified chronic kidney disease: Secondary | ICD-10-CM | POA: Diagnosis not present

## 2018-10-26 DIAGNOSIS — N186 End stage renal disease: Secondary | ICD-10-CM | POA: Diagnosis not present

## 2018-10-29 DIAGNOSIS — N186 End stage renal disease: Secondary | ICD-10-CM | POA: Diagnosis not present

## 2018-10-29 DIAGNOSIS — N2581 Secondary hyperparathyroidism of renal origin: Secondary | ICD-10-CM | POA: Diagnosis not present

## 2018-10-31 DIAGNOSIS — N2581 Secondary hyperparathyroidism of renal origin: Secondary | ICD-10-CM | POA: Diagnosis not present

## 2018-10-31 DIAGNOSIS — N186 End stage renal disease: Secondary | ICD-10-CM | POA: Diagnosis not present

## 2018-11-02 DIAGNOSIS — I10 Essential (primary) hypertension: Secondary | ICD-10-CM | POA: Diagnosis not present

## 2018-11-02 DIAGNOSIS — L03115 Cellulitis of right lower limb: Secondary | ICD-10-CM | POA: Diagnosis not present

## 2018-11-02 DIAGNOSIS — E669 Obesity, unspecified: Secondary | ICD-10-CM | POA: Diagnosis not present

## 2018-11-02 DIAGNOSIS — Z87891 Personal history of nicotine dependence: Secondary | ICD-10-CM | POA: Diagnosis not present

## 2018-11-02 DIAGNOSIS — D638 Anemia in other chronic diseases classified elsewhere: Secondary | ICD-10-CM | POA: Diagnosis not present

## 2018-11-02 DIAGNOSIS — N186 End stage renal disease: Secondary | ICD-10-CM | POA: Diagnosis not present

## 2018-11-02 DIAGNOSIS — R52 Pain, unspecified: Secondary | ICD-10-CM | POA: Diagnosis not present

## 2018-11-02 DIAGNOSIS — N2581 Secondary hyperparathyroidism of renal origin: Secondary | ICD-10-CM | POA: Diagnosis not present

## 2018-11-05 DIAGNOSIS — N2581 Secondary hyperparathyroidism of renal origin: Secondary | ICD-10-CM | POA: Diagnosis not present

## 2018-11-05 DIAGNOSIS — N186 End stage renal disease: Secondary | ICD-10-CM | POA: Diagnosis not present

## 2018-11-07 DIAGNOSIS — N186 End stage renal disease: Secondary | ICD-10-CM | POA: Diagnosis not present

## 2018-11-07 DIAGNOSIS — N2581 Secondary hyperparathyroidism of renal origin: Secondary | ICD-10-CM | POA: Diagnosis not present

## 2018-11-09 DIAGNOSIS — N186 End stage renal disease: Secondary | ICD-10-CM | POA: Diagnosis not present

## 2018-11-09 DIAGNOSIS — N2581 Secondary hyperparathyroidism of renal origin: Secondary | ICD-10-CM | POA: Diagnosis not present

## 2018-11-12 DIAGNOSIS — N186 End stage renal disease: Secondary | ICD-10-CM | POA: Diagnosis not present

## 2018-11-12 DIAGNOSIS — N2581 Secondary hyperparathyroidism of renal origin: Secondary | ICD-10-CM | POA: Diagnosis not present

## 2018-11-12 DIAGNOSIS — G932 Benign intracranial hypertension: Secondary | ICD-10-CM | POA: Diagnosis not present

## 2018-11-14 DIAGNOSIS — N2581 Secondary hyperparathyroidism of renal origin: Secondary | ICD-10-CM | POA: Diagnosis not present

## 2018-11-14 DIAGNOSIS — N186 End stage renal disease: Secondary | ICD-10-CM | POA: Diagnosis not present

## 2018-11-16 DIAGNOSIS — N186 End stage renal disease: Secondary | ICD-10-CM | POA: Diagnosis not present

## 2018-11-16 DIAGNOSIS — Z992 Dependence on renal dialysis: Secondary | ICD-10-CM | POA: Diagnosis not present

## 2018-11-19 DIAGNOSIS — N2581 Secondary hyperparathyroidism of renal origin: Secondary | ICD-10-CM | POA: Diagnosis not present

## 2018-11-19 DIAGNOSIS — N186 End stage renal disease: Secondary | ICD-10-CM | POA: Diagnosis not present

## 2018-11-20 DIAGNOSIS — N2581 Secondary hyperparathyroidism of renal origin: Secondary | ICD-10-CM | POA: Diagnosis not present

## 2018-11-20 DIAGNOSIS — N186 End stage renal disease: Secondary | ICD-10-CM | POA: Diagnosis not present

## 2018-11-23 DIAGNOSIS — N2581 Secondary hyperparathyroidism of renal origin: Secondary | ICD-10-CM | POA: Diagnosis not present

## 2018-11-23 DIAGNOSIS — N186 End stage renal disease: Secondary | ICD-10-CM | POA: Diagnosis not present

## 2018-11-25 DIAGNOSIS — Z992 Dependence on renal dialysis: Secondary | ICD-10-CM | POA: Diagnosis not present

## 2018-11-25 DIAGNOSIS — I129 Hypertensive chronic kidney disease with stage 1 through stage 4 chronic kidney disease, or unspecified chronic kidney disease: Secondary | ICD-10-CM | POA: Diagnosis not present

## 2018-11-25 DIAGNOSIS — N186 End stage renal disease: Secondary | ICD-10-CM | POA: Diagnosis not present

## 2018-11-26 DIAGNOSIS — N186 End stage renal disease: Secondary | ICD-10-CM | POA: Diagnosis not present

## 2018-11-26 DIAGNOSIS — D631 Anemia in chronic kidney disease: Secondary | ICD-10-CM | POA: Diagnosis not present

## 2018-11-26 DIAGNOSIS — N2581 Secondary hyperparathyroidism of renal origin: Secondary | ICD-10-CM | POA: Diagnosis not present

## 2018-11-28 DIAGNOSIS — N2581 Secondary hyperparathyroidism of renal origin: Secondary | ICD-10-CM | POA: Diagnosis not present

## 2018-11-28 DIAGNOSIS — D631 Anemia in chronic kidney disease: Secondary | ICD-10-CM | POA: Diagnosis not present

## 2018-11-28 DIAGNOSIS — N186 End stage renal disease: Secondary | ICD-10-CM | POA: Diagnosis not present

## 2018-11-30 DIAGNOSIS — N186 End stage renal disease: Secondary | ICD-10-CM | POA: Diagnosis not present

## 2018-11-30 DIAGNOSIS — D631 Anemia in chronic kidney disease: Secondary | ICD-10-CM | POA: Diagnosis not present

## 2018-11-30 DIAGNOSIS — N2581 Secondary hyperparathyroidism of renal origin: Secondary | ICD-10-CM | POA: Diagnosis not present

## 2018-12-03 DIAGNOSIS — N2581 Secondary hyperparathyroidism of renal origin: Secondary | ICD-10-CM | POA: Diagnosis not present

## 2018-12-03 DIAGNOSIS — D631 Anemia in chronic kidney disease: Secondary | ICD-10-CM | POA: Diagnosis not present

## 2018-12-03 DIAGNOSIS — N186 End stage renal disease: Secondary | ICD-10-CM | POA: Diagnosis not present

## 2018-12-05 DIAGNOSIS — N186 End stage renal disease: Secondary | ICD-10-CM | POA: Diagnosis not present

## 2018-12-05 DIAGNOSIS — D631 Anemia in chronic kidney disease: Secondary | ICD-10-CM | POA: Diagnosis not present

## 2018-12-05 DIAGNOSIS — N2581 Secondary hyperparathyroidism of renal origin: Secondary | ICD-10-CM | POA: Diagnosis not present

## 2018-12-07 DIAGNOSIS — D631 Anemia in chronic kidney disease: Secondary | ICD-10-CM | POA: Diagnosis not present

## 2018-12-07 DIAGNOSIS — N186 End stage renal disease: Secondary | ICD-10-CM | POA: Diagnosis not present

## 2018-12-07 DIAGNOSIS — N2581 Secondary hyperparathyroidism of renal origin: Secondary | ICD-10-CM | POA: Diagnosis not present

## 2018-12-10 DIAGNOSIS — N2581 Secondary hyperparathyroidism of renal origin: Secondary | ICD-10-CM | POA: Diagnosis not present

## 2018-12-10 DIAGNOSIS — N186 End stage renal disease: Secondary | ICD-10-CM | POA: Diagnosis not present

## 2018-12-10 DIAGNOSIS — D631 Anemia in chronic kidney disease: Secondary | ICD-10-CM | POA: Diagnosis not present

## 2018-12-11 DIAGNOSIS — E669 Obesity, unspecified: Secondary | ICD-10-CM | POA: Diagnosis not present

## 2018-12-11 DIAGNOSIS — M545 Low back pain: Secondary | ICD-10-CM | POA: Diagnosis not present

## 2018-12-11 DIAGNOSIS — I10 Essential (primary) hypertension: Secondary | ICD-10-CM | POA: Diagnosis not present

## 2018-12-11 DIAGNOSIS — D638 Anemia in other chronic diseases classified elsewhere: Secondary | ICD-10-CM | POA: Diagnosis not present

## 2018-12-11 DIAGNOSIS — Z87891 Personal history of nicotine dependence: Secondary | ICD-10-CM | POA: Diagnosis not present

## 2018-12-11 DIAGNOSIS — N186 End stage renal disease: Secondary | ICD-10-CM | POA: Diagnosis not present

## 2018-12-11 DIAGNOSIS — L03116 Cellulitis of left lower limb: Secondary | ICD-10-CM | POA: Diagnosis not present

## 2018-12-12 DIAGNOSIS — D631 Anemia in chronic kidney disease: Secondary | ICD-10-CM | POA: Diagnosis not present

## 2018-12-12 DIAGNOSIS — N2581 Secondary hyperparathyroidism of renal origin: Secondary | ICD-10-CM | POA: Diagnosis not present

## 2018-12-12 DIAGNOSIS — N186 End stage renal disease: Secondary | ICD-10-CM | POA: Diagnosis not present

## 2018-12-14 DIAGNOSIS — D631 Anemia in chronic kidney disease: Secondary | ICD-10-CM | POA: Diagnosis not present

## 2018-12-14 DIAGNOSIS — N2581 Secondary hyperparathyroidism of renal origin: Secondary | ICD-10-CM | POA: Diagnosis not present

## 2018-12-14 DIAGNOSIS — N186 End stage renal disease: Secondary | ICD-10-CM | POA: Diagnosis not present

## 2018-12-16 DIAGNOSIS — D631 Anemia in chronic kidney disease: Secondary | ICD-10-CM | POA: Diagnosis not present

## 2018-12-16 DIAGNOSIS — N186 End stage renal disease: Secondary | ICD-10-CM | POA: Diagnosis not present

## 2018-12-16 DIAGNOSIS — N2581 Secondary hyperparathyroidism of renal origin: Secondary | ICD-10-CM | POA: Diagnosis not present

## 2018-12-18 DIAGNOSIS — N186 End stage renal disease: Secondary | ICD-10-CM | POA: Diagnosis not present

## 2018-12-18 DIAGNOSIS — N2581 Secondary hyperparathyroidism of renal origin: Secondary | ICD-10-CM | POA: Diagnosis not present

## 2018-12-18 DIAGNOSIS — D631 Anemia in chronic kidney disease: Secondary | ICD-10-CM | POA: Diagnosis not present

## 2018-12-21 DIAGNOSIS — N2581 Secondary hyperparathyroidism of renal origin: Secondary | ICD-10-CM | POA: Diagnosis not present

## 2018-12-21 DIAGNOSIS — D631 Anemia in chronic kidney disease: Secondary | ICD-10-CM | POA: Diagnosis not present

## 2018-12-21 DIAGNOSIS — N186 End stage renal disease: Secondary | ICD-10-CM | POA: Diagnosis not present

## 2018-12-23 DIAGNOSIS — N2581 Secondary hyperparathyroidism of renal origin: Secondary | ICD-10-CM | POA: Diagnosis not present

## 2018-12-23 DIAGNOSIS — D631 Anemia in chronic kidney disease: Secondary | ICD-10-CM | POA: Diagnosis not present

## 2018-12-23 DIAGNOSIS — N186 End stage renal disease: Secondary | ICD-10-CM | POA: Diagnosis not present

## 2018-12-25 DIAGNOSIS — D631 Anemia in chronic kidney disease: Secondary | ICD-10-CM | POA: Diagnosis not present

## 2018-12-25 DIAGNOSIS — N2581 Secondary hyperparathyroidism of renal origin: Secondary | ICD-10-CM | POA: Diagnosis not present

## 2018-12-25 DIAGNOSIS — N186 End stage renal disease: Secondary | ICD-10-CM | POA: Diagnosis not present

## 2018-12-26 DIAGNOSIS — Z992 Dependence on renal dialysis: Secondary | ICD-10-CM | POA: Diagnosis not present

## 2018-12-26 DIAGNOSIS — L97509 Non-pressure chronic ulcer of other part of unspecified foot with unspecified severity: Secondary | ICD-10-CM

## 2018-12-26 DIAGNOSIS — I129 Hypertensive chronic kidney disease with stage 1 through stage 4 chronic kidney disease, or unspecified chronic kidney disease: Secondary | ICD-10-CM | POA: Diagnosis not present

## 2018-12-26 DIAGNOSIS — N186 End stage renal disease: Secondary | ICD-10-CM | POA: Diagnosis not present

## 2018-12-26 HISTORY — DX: Non-pressure chronic ulcer of other part of unspecified foot with unspecified severity: L97.509

## 2018-12-28 DIAGNOSIS — D631 Anemia in chronic kidney disease: Secondary | ICD-10-CM | POA: Diagnosis not present

## 2018-12-28 DIAGNOSIS — N2581 Secondary hyperparathyroidism of renal origin: Secondary | ICD-10-CM | POA: Diagnosis not present

## 2018-12-28 DIAGNOSIS — N186 End stage renal disease: Secondary | ICD-10-CM | POA: Diagnosis not present

## 2018-12-31 DIAGNOSIS — N186 End stage renal disease: Secondary | ICD-10-CM | POA: Diagnosis not present

## 2018-12-31 DIAGNOSIS — N2581 Secondary hyperparathyroidism of renal origin: Secondary | ICD-10-CM | POA: Diagnosis not present

## 2018-12-31 DIAGNOSIS — D631 Anemia in chronic kidney disease: Secondary | ICD-10-CM | POA: Diagnosis not present

## 2019-01-02 DIAGNOSIS — D631 Anemia in chronic kidney disease: Secondary | ICD-10-CM | POA: Diagnosis not present

## 2019-01-02 DIAGNOSIS — N2581 Secondary hyperparathyroidism of renal origin: Secondary | ICD-10-CM | POA: Diagnosis not present

## 2019-01-02 DIAGNOSIS — N186 End stage renal disease: Secondary | ICD-10-CM | POA: Diagnosis not present

## 2019-01-04 DIAGNOSIS — N2581 Secondary hyperparathyroidism of renal origin: Secondary | ICD-10-CM | POA: Diagnosis not present

## 2019-01-04 DIAGNOSIS — N760 Acute vaginitis: Secondary | ICD-10-CM | POA: Diagnosis not present

## 2019-01-04 DIAGNOSIS — N186 End stage renal disease: Secondary | ICD-10-CM | POA: Diagnosis not present

## 2019-01-04 DIAGNOSIS — I10 Essential (primary) hypertension: Secondary | ICD-10-CM | POA: Diagnosis not present

## 2019-01-04 DIAGNOSIS — D638 Anemia in other chronic diseases classified elsewhere: Secondary | ICD-10-CM | POA: Diagnosis not present

## 2019-01-04 DIAGNOSIS — Z87891 Personal history of nicotine dependence: Secondary | ICD-10-CM | POA: Diagnosis not present

## 2019-01-04 DIAGNOSIS — M545 Low back pain: Secondary | ICD-10-CM | POA: Diagnosis not present

## 2019-01-04 DIAGNOSIS — L03116 Cellulitis of left lower limb: Secondary | ICD-10-CM | POA: Diagnosis not present

## 2019-01-04 DIAGNOSIS — E669 Obesity, unspecified: Secondary | ICD-10-CM | POA: Diagnosis not present

## 2019-01-04 DIAGNOSIS — D631 Anemia in chronic kidney disease: Secondary | ICD-10-CM | POA: Diagnosis not present

## 2019-01-07 DIAGNOSIS — N186 End stage renal disease: Secondary | ICD-10-CM | POA: Diagnosis not present

## 2019-01-07 DIAGNOSIS — N2581 Secondary hyperparathyroidism of renal origin: Secondary | ICD-10-CM | POA: Diagnosis not present

## 2019-01-07 DIAGNOSIS — D631 Anemia in chronic kidney disease: Secondary | ICD-10-CM | POA: Diagnosis not present

## 2019-01-09 DIAGNOSIS — D631 Anemia in chronic kidney disease: Secondary | ICD-10-CM | POA: Diagnosis not present

## 2019-01-09 DIAGNOSIS — N186 End stage renal disease: Secondary | ICD-10-CM | POA: Diagnosis not present

## 2019-01-09 DIAGNOSIS — N2581 Secondary hyperparathyroidism of renal origin: Secondary | ICD-10-CM | POA: Diagnosis not present

## 2019-01-11 DIAGNOSIS — N2581 Secondary hyperparathyroidism of renal origin: Secondary | ICD-10-CM | POA: Diagnosis not present

## 2019-01-11 DIAGNOSIS — D631 Anemia in chronic kidney disease: Secondary | ICD-10-CM | POA: Diagnosis not present

## 2019-01-11 DIAGNOSIS — N186 End stage renal disease: Secondary | ICD-10-CM | POA: Diagnosis not present

## 2019-01-14 DIAGNOSIS — N186 End stage renal disease: Secondary | ICD-10-CM | POA: Diagnosis not present

## 2019-01-14 DIAGNOSIS — D631 Anemia in chronic kidney disease: Secondary | ICD-10-CM | POA: Diagnosis not present

## 2019-01-14 DIAGNOSIS — N2581 Secondary hyperparathyroidism of renal origin: Secondary | ICD-10-CM | POA: Diagnosis not present

## 2019-01-15 ENCOUNTER — Other Ambulatory Visit (HOSPITAL_COMMUNITY)
Admission: RE | Admit: 2019-01-15 | Discharge: 2019-01-15 | Disposition: A | Discharge status: Home / Self Care | Payer: Medicare Other | Attending: Internal Medicine | Admitting: Internal Medicine

## 2019-01-15 ENCOUNTER — Ambulatory Visit (HOSPITAL_COMMUNITY)
Admission: RE | Admit: 2019-01-15 | Discharge: 2019-01-15 | Disposition: A | Discharge status: Home / Self Care | Payer: Medicare Other | Source: Ambulatory Visit | Attending: Family | Admitting: Family

## 2019-01-15 ENCOUNTER — Encounter (HOSPITAL_BASED_OUTPATIENT_CLINIC_OR_DEPARTMENT_OTHER): Payer: Medicare Other

## 2019-01-15 ENCOUNTER — Other Ambulatory Visit (HOSPITAL_COMMUNITY): Payer: Self-pay | Admitting: Internal Medicine

## 2019-01-15 ENCOUNTER — Ambulatory Visit (HOSPITAL_COMMUNITY)
Admission: RE | Admit: 2019-01-15 | Discharge: 2019-01-15 | Disposition: A | Discharge status: Home / Self Care | Payer: Medicare Other | Source: Ambulatory Visit | Attending: Internal Medicine | Admitting: Internal Medicine

## 2019-01-15 DIAGNOSIS — D62 Acute posthemorrhagic anemia: Secondary | ICD-10-CM | POA: Diagnosis not present

## 2019-01-15 DIAGNOSIS — S91302A Unspecified open wound, left foot, initial encounter: Secondary | ICD-10-CM | POA: Diagnosis not present

## 2019-01-15 DIAGNOSIS — N186 End stage renal disease: Secondary | ICD-10-CM | POA: Diagnosis not present

## 2019-01-15 DIAGNOSIS — L97525 Non-pressure chronic ulcer of other part of left foot with muscle involvement without evidence of necrosis: Secondary | ICD-10-CM | POA: Diagnosis not present

## 2019-01-15 DIAGNOSIS — I70262 Atherosclerosis of native arteries of extremities with gangrene, left leg: Secondary | ICD-10-CM | POA: Diagnosis not present

## 2019-01-15 DIAGNOSIS — L98499 Non-pressure chronic ulcer of skin of other sites with unspecified severity: Secondary | ICD-10-CM | POA: Insufficient documentation

## 2019-01-15 DIAGNOSIS — M86172 Other acute osteomyelitis, left ankle and foot: Secondary | ICD-10-CM | POA: Insufficient documentation

## 2019-01-15 DIAGNOSIS — I7092 Chronic total occlusion of artery of the extremities: Secondary | ICD-10-CM | POA: Diagnosis not present

## 2019-01-15 DIAGNOSIS — I120 Hypertensive chronic kidney disease with stage 5 chronic kidney disease or end stage renal disease: Secondary | ICD-10-CM | POA: Diagnosis not present

## 2019-01-15 DIAGNOSIS — N2581 Secondary hyperparathyroidism of renal origin: Secondary | ICD-10-CM | POA: Diagnosis not present

## 2019-01-15 NOTE — Progress Notes (Signed)
Dr. Dellia Nims asked patient to be seen at [facility] regarding exam results. Appointment made today for patient to see Dr. Carlis Abbott on Tuesday 01/22/2019.

## 2019-01-16 ENCOUNTER — Inpatient Hospital Stay (HOSPITAL_COMMUNITY)
Admission: EM | Admit: 2019-01-16 | Discharge: 2019-01-26 | DRG: 239 | Disposition: A | Discharge status: Skilled Nursing Facility | Payer: Medicare Other | Attending: Internal Medicine | Admitting: Internal Medicine

## 2019-01-16 ENCOUNTER — Other Ambulatory Visit: Payer: Self-pay

## 2019-01-16 ENCOUNTER — Emergency Department (HOSPITAL_COMMUNITY): Payer: Medicare Other

## 2019-01-16 ENCOUNTER — Encounter (HOSPITAL_COMMUNITY): Payer: Self-pay

## 2019-01-16 DIAGNOSIS — I10 Essential (primary) hypertension: Secondary | ICD-10-CM | POA: Diagnosis not present

## 2019-01-16 DIAGNOSIS — G8918 Other acute postprocedural pain: Secondary | ICD-10-CM | POA: Diagnosis not present

## 2019-01-16 DIAGNOSIS — Z7401 Bed confinement status: Secondary | ICD-10-CM | POA: Diagnosis not present

## 2019-01-16 DIAGNOSIS — L97529 Non-pressure chronic ulcer of other part of left foot with unspecified severity: Secondary | ICD-10-CM | POA: Diagnosis present

## 2019-01-16 DIAGNOSIS — I120 Hypertensive chronic kidney disease with stage 5 chronic kidney disease or end stage renal disease: Secondary | ICD-10-CM | POA: Diagnosis not present

## 2019-01-16 DIAGNOSIS — R262 Difficulty in walking, not elsewhere classified: Secondary | ICD-10-CM | POA: Diagnosis not present

## 2019-01-16 DIAGNOSIS — N186 End stage renal disease: Secondary | ICD-10-CM | POA: Diagnosis not present

## 2019-01-16 DIAGNOSIS — S98312D Complete traumatic amputation of left midfoot, subsequent encounter: Secondary | ICD-10-CM | POA: Diagnosis not present

## 2019-01-16 DIAGNOSIS — Z89431 Acquired absence of right foot: Secondary | ICD-10-CM | POA: Diagnosis not present

## 2019-01-16 DIAGNOSIS — S91302D Unspecified open wound, left foot, subsequent encounter: Secondary | ICD-10-CM | POA: Diagnosis not present

## 2019-01-16 DIAGNOSIS — N2581 Secondary hyperparathyroidism of renal origin: Secondary | ICD-10-CM | POA: Diagnosis present

## 2019-01-16 DIAGNOSIS — S91302A Unspecified open wound, left foot, initial encounter: Secondary | ICD-10-CM | POA: Diagnosis not present

## 2019-01-16 DIAGNOSIS — R52 Pain, unspecified: Secondary | ICD-10-CM | POA: Diagnosis not present

## 2019-01-16 DIAGNOSIS — I998 Other disorder of circulatory system: Secondary | ICD-10-CM | POA: Diagnosis present

## 2019-01-16 DIAGNOSIS — I739 Peripheral vascular disease, unspecified: Secondary | ICD-10-CM | POA: Diagnosis not present

## 2019-01-16 DIAGNOSIS — Z992 Dependence on renal dialysis: Secondary | ICD-10-CM | POA: Diagnosis not present

## 2019-01-16 DIAGNOSIS — D62 Acute posthemorrhagic anemia: Secondary | ICD-10-CM | POA: Diagnosis not present

## 2019-01-16 DIAGNOSIS — I129 Hypertensive chronic kidney disease with stage 1 through stage 4 chronic kidney disease, or unspecified chronic kidney disease: Secondary | ICD-10-CM | POA: Diagnosis not present

## 2019-01-16 DIAGNOSIS — F17210 Nicotine dependence, cigarettes, uncomplicated: Secondary | ICD-10-CM | POA: Diagnosis present

## 2019-01-16 DIAGNOSIS — L97521 Non-pressure chronic ulcer of other part of left foot limited to breakdown of skin: Secondary | ICD-10-CM | POA: Diagnosis not present

## 2019-01-16 DIAGNOSIS — I959 Hypotension, unspecified: Secondary | ICD-10-CM | POA: Diagnosis not present

## 2019-01-16 DIAGNOSIS — I70262 Atherosclerosis of native arteries of extremities with gangrene, left leg: Principal | ICD-10-CM | POA: Diagnosis present

## 2019-01-16 DIAGNOSIS — I70445 Atherosclerosis of autologous vein bypass graft(s) of the left leg with ulceration of other part of foot: Secondary | ICD-10-CM | POA: Diagnosis not present

## 2019-01-16 DIAGNOSIS — L97523 Non-pressure chronic ulcer of other part of left foot with necrosis of muscle: Secondary | ICD-10-CM | POA: Diagnosis not present

## 2019-01-16 DIAGNOSIS — N185 Chronic kidney disease, stage 5: Secondary | ICD-10-CM | POA: Diagnosis not present

## 2019-01-16 DIAGNOSIS — I7092 Chronic total occlusion of artery of the extremities: Secondary | ICD-10-CM | POA: Diagnosis present

## 2019-01-16 DIAGNOSIS — H548 Legal blindness, as defined in USA: Secondary | ICD-10-CM | POA: Diagnosis present

## 2019-01-16 DIAGNOSIS — Z89432 Acquired absence of left foot: Secondary | ICD-10-CM

## 2019-01-16 DIAGNOSIS — D631 Anemia in chronic kidney disease: Secondary | ICD-10-CM | POA: Diagnosis not present

## 2019-01-16 DIAGNOSIS — M2550 Pain in unspecified joint: Secondary | ICD-10-CM | POA: Diagnosis not present

## 2019-01-16 DIAGNOSIS — H543 Unqualified visual loss, both eyes: Secondary | ICD-10-CM | POA: Diagnosis present

## 2019-01-16 DIAGNOSIS — I96 Gangrene, not elsewhere classified: Secondary | ICD-10-CM | POA: Diagnosis not present

## 2019-01-16 DIAGNOSIS — M6281 Muscle weakness (generalized): Secondary | ICD-10-CM | POA: Diagnosis not present

## 2019-01-16 DIAGNOSIS — R5381 Other malaise: Secondary | ICD-10-CM | POA: Diagnosis not present

## 2019-01-16 DIAGNOSIS — R29898 Other symptoms and signs involving the musculoskeletal system: Secondary | ICD-10-CM | POA: Diagnosis not present

## 2019-01-16 HISTORY — DX: Non-pressure chronic ulcer of other part of unspecified foot with unspecified severity: L97.509

## 2019-01-16 HISTORY — DX: Peripheral vascular disease, unspecified: I73.9

## 2019-01-16 HISTORY — DX: Unspecified visual loss: H54.7

## 2019-01-16 LAB — CBC WITH DIFFERENTIAL/PLATELET
Abs Immature Granulocytes: 0.03 10*3/uL (ref 0.00–0.07)
Basophils Absolute: 0 10*3/uL (ref 0.0–0.1)
Basophils Relative: 1 %
Eosinophils Absolute: 0.3 10*3/uL (ref 0.0–0.5)
Eosinophils Relative: 3 %
HCT: 29.1 % — ABNORMAL LOW (ref 36.0–46.0)
Hemoglobin: 9.1 g/dL — ABNORMAL LOW (ref 12.0–15.0)
Immature Granulocytes: 0 %
Lymphocytes Relative: 13 %
Lymphs Abs: 1.1 10*3/uL (ref 0.7–4.0)
MCH: 31 pg (ref 26.0–34.0)
MCHC: 31.3 g/dL (ref 30.0–36.0)
MCV: 99 fL (ref 80.0–100.0)
Monocytes Absolute: 0.7 10*3/uL (ref 0.1–1.0)
Monocytes Relative: 8 %
Neutro Abs: 6.2 10*3/uL (ref 1.7–7.7)
Neutrophils Relative %: 75 %
Platelets: 285 10*3/uL (ref 150–400)
RBC: 2.94 MIL/uL — ABNORMAL LOW (ref 3.87–5.11)
RDW: 15.2 % (ref 11.5–15.5)
WBC: 8.3 10*3/uL (ref 4.0–10.5)
nRBC: 0 % (ref 0.0–0.2)

## 2019-01-16 LAB — BASIC METABOLIC PANEL
Anion gap: 15 (ref 5–15)
BUN: 14 mg/dL (ref 6–20)
CO2: 25 mmol/L (ref 22–32)
Calcium: 9.5 mg/dL (ref 8.9–10.3)
Chloride: 96 mmol/L — ABNORMAL LOW (ref 98–111)
Creatinine, Ser: 5.28 mg/dL — ABNORMAL HIGH (ref 0.44–1.00)
GFR calc Af Amer: 11 mL/min — ABNORMAL LOW (ref >60)
GFR calc non Af Amer: 9 mL/min — ABNORMAL LOW (ref >60)
Glucose, Bld: 90 mg/dL (ref 70–99)
Potassium: 3.1 mmol/L — ABNORMAL LOW (ref 3.5–5.1)
Sodium: 136 mmol/L (ref 135–145)

## 2019-01-16 MED ORDER — AMLODIPINE BESYLATE 10 MG PO TABS
10.0000 mg | ORAL_TABLET | Freq: Every day | ORAL | Status: DC
  Administered 2019-01-17 – 2019-01-24 (×7): 10 mg via ORAL
  Filled 2019-01-16 (×2): qty 1
  Filled 2019-01-16: qty 2
  Filled 2019-01-16 (×4): qty 1

## 2019-01-16 MED ORDER — SEVELAMER CARBONATE 800 MG PO TABS: 1600.0000 mg | ORAL_TABLET | ORAL | Status: DC

## 2019-01-16 MED ORDER — FENTANYL CITRATE (PF) 100 MCG/2ML IJ SOLN
50.0000 ug | INTRAMUSCULAR | Status: DC | PRN
  Administered 2019-01-16 – 2019-01-18 (×15): 50 ug via INTRAVENOUS
  Filled 2019-01-16 (×15): qty 2

## 2019-01-16 MED ORDER — FENTANYL CITRATE (PF) 100 MCG/2ML IJ SOLN
50.0000 ug | Freq: Once | INTRAMUSCULAR | Status: AC
  Administered 2019-01-16: 50 ug via INTRAVENOUS
  Filled 2019-01-16: qty 2

## 2019-01-16 MED ORDER — RENA-VITE PO TABS
1.0000 | ORAL_TABLET | Freq: Every day | ORAL | Status: DC
  Administered 2019-01-16 – 2019-01-25 (×10): 1 via ORAL
  Filled 2019-01-16 (×10): qty 1

## 2019-01-16 MED ORDER — ONDANSETRON HCL 4 MG PO TABS: 4.0000 mg | ORAL_TABLET | Freq: Four times a day (QID) | ORAL | Status: DC | PRN

## 2019-01-16 MED ORDER — ACETAMINOPHEN 325 MG PO TABS: 650.0000 mg | ORAL_TABLET | Freq: Four times a day (QID) | ORAL | Status: DC | PRN

## 2019-01-16 MED ORDER — SEVELAMER CARBONATE 800 MG PO TABS: 1600.0000 mg | ORAL_TABLET | ORAL | Status: DC | PRN

## 2019-01-16 MED ORDER — CARVEDILOL 25 MG PO TABS
25.0000 mg | ORAL_TABLET | Freq: Two times a day (BID) | ORAL | Status: DC
  Administered 2019-01-16 – 2019-01-26 (×16): 25 mg via ORAL
  Filled 2019-01-16 (×16): qty 1

## 2019-01-16 MED ORDER — LIDOCAINE HCL (PF) 1 % IJ SOLN: 5.0000 mL | Freq: Once | INTRAMUSCULAR | Status: DC

## 2019-01-16 MED ORDER — ACETAMINOPHEN 650 MG RE SUPP: 650.0000 mg | Freq: Four times a day (QID) | RECTAL | Status: DC | PRN

## 2019-01-16 MED ORDER — CLONIDINE HCL 0.2 MG PO TABS
0.2000 mg | ORAL_TABLET | Freq: Three times a day (TID) | ORAL | Status: DC
  Administered 2019-01-16 – 2019-01-22 (×13): 0.2 mg via ORAL
  Filled 2019-01-16 (×14): qty 1

## 2019-01-16 MED ORDER — ONDANSETRON HCL 4 MG/2ML IJ SOLN: 4.0000 mg | Freq: Four times a day (QID) | INTRAMUSCULAR | Status: DC | PRN

## 2019-01-16 MED ORDER — SEVELAMER CARBONATE 800 MG PO TABS
3200.0000 mg | ORAL_TABLET | Freq: Three times a day (TID) | ORAL | Status: DC
  Administered 2019-01-17 – 2019-01-25 (×20): 3200 mg via ORAL
  Filled 2019-01-16 (×21): qty 4

## 2019-01-16 NOTE — ED Notes (Signed)
Patient transported to X-ray 

## 2019-01-16 NOTE — ED Provider Notes (Signed)
[facility] EMERGENCY DEPARTMENT Provider Note   CSN: [id number] Arrival date & time: 01/16/19  1659     History   Chief Complaint No chief complaint on file.   HPI Rebekah Burton is a 43 y.o. female presenting today for wound and pain of the left foot.  Patient with history of end-stage renal disease, hypertension, PAD, visual impairment.  Patient states that approximately 2 months ago she noticed a pain to her left foot that has continued to worsen since time of onset, moderate intensity constant throbbing pain worse with palpation without alleviating factors.  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  Patient informed by wound clinic that she will need to follow-up with vein and vascular for further evaluation and management, patient scheduled herself an appointment on Tuesday with vascular surgeon for further evaluation of her foot.  When asked why patient decided to present to emergency department today she states that her pain increased and she could not wait till Tuesday to see her vascular surgeon.  Patient has been on multiple antibiotics for this chronic wound including amoxicillin and clindamycin.  She denies history of fever, chills or change in the nature of her pain.  Patient without other concerns at this time.  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  HPI  Past Medical History:  Diagnosis Date  . ESRD (end stage renal disease) ([place])   . Hypertension     There are no active problems to display for this patient.   Past Surgical History:  Procedure Laterality Date  . toe removal Right    All toes on right foot have been removed.      OB History   No obstetric  history on file.      Home Medications    Prior to Admission medications   Medication Sig Start Date End Date Taking? Authorizing Provider  amLODipine (NORVASC) 10 MG tablet Take 10 mg by mouth daily. 11/23/18  Yes [provider]  Aspirin-Acetaminophen-Caffeine (GOODY HEADACHE PO) Take 1 packet by mouth as needed (for headaches).   Yes [provider]  b complex-vitamin c-folic acid (NEPHRO-VITE) 0.8 MG TABS tablet Take 1 tablet by mouth every Monday, Wednesday, and Friday with hemodialysis. 11/20/18  Yes [provider]  carvedilol (COREG) 25 MG tablet Take 25 mg by mouth 2 (two) times daily. 01/04/19  Yes [provider]  cloNIDine (CATAPRES) 0.2 MG tablet Take 0.2 mg by mouth 3 (three) times daily. 10/16/18  Yes [provider]  lidocaine-prilocaine (EMLA) cream Apply 1 application topically every Monday, Wednesday, and Friday with hemodialysis.  11/20/18  Yes [provider]  RENVELA 800 MG tablet Take 1,600-3,200 mg by mouth See admin instructions. Take 3,200 mg by mouth three times a day with meals and 1,600 mg with each snack 11/20/18  Yes [provider]    Family History No family history on file.  Social History Social History   Tobacco Use  . Smoking status: Not on file  Substance Use Topics  . Alcohol use: Not on file  . Drug use: Not on file     Allergies   Percocet [oxycodone-acetaminophen]   Review of Systems Review of Systems  Constitutional: Negative.  Negative for chills and fever.  Respiratory: Negative.  Negative for cough and shortness of breath.   Cardiovascular: Negative.  Negative for chest pain.  Gastrointestinal: Negative.  Negative for abdominal pain, diarrhea, nausea and vomiting.  Musculoskeletal: Negative.  Negative for arthralgias and myalgias.  Skin: Positive for wound (Left foot).  Neurological: Negative.  Negative for weakness, numbness and headaches.  All other systems  reviewed and are negative.  Physical Exam Updated Vital Signs BP (!) 135/91   Pulse 82   Temp 98.7 F (37.1 C) (Oral)   Resp 16   SpO2 100%   Physical Exam Constitutional:      General: She is not in acute distress.    Appearance: She is obese. She is not ill-appearing or diaphoretic.  HENT:     Head: Normocephalic and atraumatic.     Right Ear: External ear normal.     Left Ear: External ear normal.     Nose: Nose normal.     Mouth/Throat:     Mouth: Mucous membranes are moist.     Pharynx: Oropharynx is clear.  Eyes:     Comments: Blind  Neck:     Musculoskeletal: Normal range of motion and neck supple. No muscular tenderness.  Cardiovascular:     Rate and Rhythm: Normal rate and regular rhythm.     Pulses:          Dorsalis pedis pulses are 2+ on the right side and detected w/ Doppler on the left side.       Posterior tibial pulses are 2+ on the right side and detected w/ Doppler on the left side.     Heart sounds: Normal heart sounds.  Pulmonary:     Effort: Pulmonary effort is normal.     Breath sounds: Normal breath sounds.  Abdominal:     Palpations: Abdomen is soft.     Tenderness: There is no abdominal tenderness. There is no guarding or rebound.  Musculoskeletal:     Right lower leg: Normal.     Left lower leg: Normal.  Feet:     Right foot:     Amputation: Right leg is amputated below ankle.     Left foot:     Protective Sensation: 3 sites tested. 3 sites sensed.     Skin integrity: Skin breakdown present.     Comments: Right foot status post amputation of phalanges.  Pedal pulses strong, sensation intact movement intact.  Left foot with large chronic appearing wound present, appears as though extensor tendons are present.  Please see picture below.  Sensation intact to all toes.  Range of motion intact to first and second toe however absent in lateral 3 toes.  Pedal pulses detected by Doppler. Skin:    General: Skin is warm and dry.  Neurological:      General: No focal deficit present.     Mental Status: She is alert.     GCS: GCS eye subscore is 4. GCS verbal subscore is 5. GCS motor subscore is 6.  Psychiatric:        Mood and Affect: Mood normal.        Behavior: Behavior normal.       ED Treatments / Results  Labs (all labs ordered are listed, but only abnormal results are displayed) Labs Reviewed  CBC WITH DIFFERENTIAL/PLATELET - Abnormal; Notable for the following components:      Result Value   RBC 2.94 (*)    Hemoglobin 9.1 (*)  HCT 29.1 (*)    All other components within normal limits  BASIC METABOLIC PANEL - Abnormal; Notable for the following components:   Potassium 3.1 (*)    Chloride 96 (*)    Creatinine, Ser 5.28 (*)    GFR calc non Af Amer 9 (*)    GFR calc Af Amer 11 (*)    All other components within normal limits    EKG None  Radiology Dg Foot Complete Left  Result Date: 01/16/2019 CLINICAL DATA:  Left foot wound with black and 3rd through 5th toes. Wound present for 2 months. End-stage renal disease. EXAM: LEFT FOOT - COMPLETE 3+ VIEW COMPARISON:  Radiographs 01/15/2019. FINDINGS: Periarticular osteopenia again noted at the 2nd through 5th metatarsophalangeal joints without cortical destruction. There is no evidence of acute fracture or dislocation. There is stable spurring along the lateral base of the 1st proximal phalanx. There are no erosive changes. Forefoot soft tissue swelling appears stable without radiopaque foreign body or soft tissue emphysema. Calcaneal spurring and diffuse vascular calcifications are noted. IMPRESSION: Stable radiographs compared with prior study from yesterday. No radiographic evidence of osteomyelitis. Electronically Signed   By: Richardean Sale M.D.   On: 01/16/2019 19:35   Dg Foot Complete Left  Result Date: 01/15/2019 CLINICAL DATA:  Nonhealing wound on dorsal side of left foot. EXAM: LEFT FOOT - COMPLETE 3+ VIEW COMPARISON:  None. FINDINGS: Mild diffuse soft tissue  swelling. There is periarticular osteopenia at the second through fifth MTP joints. No acute fractures, dislocations are areas of bone erosion identified. Posterior and plantar calcaneal heel spurs. IMPRESSION: 1. Soft tissue swelling. 2. No acute bone abnormality. Electronically Signed   By: Kerby Moors M.D.   On: 01/15/2019 20:07   Vas [place] Lower Extremity Arterial Duplex  Result Date: 01/16/2019 LOWER EXTREMITY ARTERIAL DUPLEX STUDY Indications: Peripheral artery disease, and Left non-healing foot ulcer.  Vascular Interventions: Right Fem-BK Pop BPG placed 2018 per patient at outside                         facility. Current ABI:            Unknown Performing Technologist: Caralee Ates BA, RVT, RDMS  Examination Guidelines: A complete evaluation includes B-mode imaging, spectral Doppler, color Doppler, and power Doppler as needed of all accessible portions of each vessel. Bilateral testing is considered an integral part of a complete examination. Limited examinations for reoccurring indications may be performed as noted.  Right Duplex Findings: +-----------+--------+-----+--------+----------+--------------+            PSV cm/sRatioStenosisWaveform  Comments       +-----------+--------+-----+--------+----------+--------------+ ATA Distal 56                   monophasic               +-----------+--------+-----+--------+----------+--------------+ PTA Mid    80                   biphasic                 +-----------+--------+-----+--------+----------+--------------+ PTA Distal 85                   monophasic               +-----------+--------+-----+--------+----------+--------------+ PERO Distal  Not visualized +-----------+--------+-----+--------+----------+--------------+  Right Graft #1: Femoral artery - Below knee popliteal artery +------------------+--------+--------+---------+------------+                   PSV cm/sStenosisWaveform  Comments     +------------------+--------+--------+---------+------------+ Inflow            65              triphasic             +------------------+--------+--------+---------+------------+ Prox Anastomosis  121             triphasic             +------------------+--------+--------+---------+------------+ Proximal Graft    136             triphasic             +------------------+--------+--------+---------+------------+ Mid Graft         82              triphasic             +------------------+--------+--------+---------+------------+ Distal Graft      111             triphasicValves noted +------------------+--------+--------+---------+------------+ Distal Anastomosis78              triphasic             +------------------+--------+--------+---------+------------+ Outflow           74              triphasic             +------------------+--------+--------+---------+------------+  Left Duplex Findings: +-----------+--------+-----+---------------+-------------------+--------+            PSV cm/sRatioStenosis       Waveform           Comments +-----------+--------+-----+---------------+-------------------+--------+ CFA Distal 221          50-74% stenosismonophasic                  +-----------+--------+-----+---------------+-------------------+--------+ DFA        259          50-74% stenosismonophasic                  +-----------+--------+-----+---------------+-------------------+--------+ SFA Prox   21                                                      +-----------+--------+-----+---------------+-------------------+--------+ SFA Mid                 occluded                                   +-----------+--------+-----+---------------+-------------------+--------+ SFA Distal 11                          monophasic                  +-----------+--------+-----+---------------+-------------------+--------+ POP Prox   19                           monophasic                  +-----------+--------+-----+---------------+-------------------+--------+ POP Mid    18  monophasic                  +-----------+--------+-----+---------------+-------------------+--------+ POP Distal 15                          monophasic                  +-----------+--------+-----+---------------+-------------------+--------+ ATA Distal 15                          monophasic                  +-----------+--------+-----+---------------+-------------------+--------+ PTA Prox   16                          monophasic                  +-----------+--------+-----+---------------+-------------------+--------+ PTA Mid    16                          monophasic                  +-----------+--------+-----+---------------+-------------------+--------+ PTA Distal 21                          monophasic                  +-----------+--------+-----+---------------+-------------------+--------+ PERO Prox  12                          dampened monophasic         +-----------+--------+-----+---------------+-------------------+--------+ PERO Mid   9                           monophasic                  +-----------+--------+-----+---------------+-------------------+--------+ PERO Distal12                          monophasic                  +-----------+--------+-----+---------------+-------------------+--------+  Findings reported to Vivien Rota at Dr. Dellia Nims at 4:11 pm.  Summary: Right Graft(s): Patent right femoral - below knee popliteal BPG without evidence of stenosis. Left: 50-74% stenosis noted in the common femoral artery. 50-74% stenosis noted in the deep femoral artery. Total occlusion noted in the superficial femoral artery.  See table(s) above for measurements and observations. Electronically signed by Curt Jews MD on 01/16/2019 at 8:13:50 AM.    Final     Procedures Procedures  (including critical care time)  Medications Ordered in ED Medications  fentaNYL (SUBLIMAZE) injection 50 mcg (50 mcg Intravenous Given 01/16/19 2037)   Initial Impression / Assessment and Plan / ED Course  I have reviewed the triage vital signs and the nursing notes.  Pertinent labs & imaging results that were available during my care of the patient were reviewed by me and considered in my medical decision making (see chart for details).    43 year old female on dialysis presenting today for 14-month history of wound to left foot.  Sensation intact to all toes, pedal pulses detected by Doppler, movement of first and second left toes intact however without movement of the third/fourth or fifth toe.  Vital signs  within normal limits.  Patient with a history of fever, chills.  Wound appears chronic in nature, patient has been on multiple antibiotics recently per patient recently finished clindamycin.  DG left foot without acute findings or signs of osteomyelitis Reviewed previous arterial duplex which shows stenosis of left femoral arteries  Patient seen and evaluated by Dr. Stark Jock, will consult vascular.  8:15 PM: Discussed case with vascular, Dr. Donnetta Hutching who advises admission to hospitalist service at this time.  Vascular to see patient in hospital for further treatment. ------------------- CBC appears baseline BMP nonacute ----------------- 9:25 PM: Discussed case with Dr. Alcario Drought who will be seeing patient in the emergency department for admission.  Patient has been admitted to hospital service for further evaluation and treatment.   Note: Portions of this report may have been transcribed using voice recognition software. Every effort was made to ensure accuracy; however, inadvertent computerized transcription errors may still be present. Final Clinical Impressions(s) / ED Diagnoses   Final diagnoses:  Open wound of left foot, initial encounter    ED Discharge Orders    None         Gari Crown 01/16/19 2149    Veryl Speak, MD 01/16/19 2241

## 2019-01-16 NOTE — H&P (Signed)
History and Physical    Rebekah Burton CWC:[id number] DOB: 07/16/1976 DOA: 01/16/2019  PCP: Patient, [name]  Patient coming from: Home  I have personally briefly reviewed patient's old medical records in [place]  Chief Complaint: L foot wound and pain  HPI: Rebekah Burton is a 43 y.o. female with medical history significant of PAD, ESRD on HD through L arm fistula, HTN, blind.  Patient has h/o removal of toes of R foot due to ischemia previously.  Patient presents to ED with c/o L foot pain / ulcer.  This has been ongoing for ~2 months.  Worsening.  Seen yesterday at wound clinic.  Arterial studies performed at that time: stenosis of the common and deep femoral artery and complete occlusion of the superficial femoral artery.  Patient informed by wound clinic that she will need to follow-up with vein and vascular for further evaluation and management, patient scheduled herself an appointment on Tuesday with vascular surgeon for further evaluation of her foot.  Pain became worse, couldn't wait to Tuesday so presents to ED today.  Multiple ABx including amoxicillin and clinda previously for wound without benefit.   ED Course: EDP spoke with Dr. Donnetta Hutching, pt to be NPO after MN he will see tonight or tomorrow.   Review of Systems: As per HPI otherwise 10 point review of systems negative.   Past Medical History:  Diagnosis Date  . Blind   . ESRD (end stage renal disease) ([place])   . Hypertension   . PAD (peripheral artery disease) ([place])     Past Surgical History:  Procedure Laterality Date  . toe removal Right    All toes on right foot have been removed.      reports that she has been smoking. She does not have any smokeless tobacco history on file. She reports that she does not drink alcohol. No history on file for drug.  Allergies  Allergen Reactions  . Percocet [Oxycodone-Acetaminophen] Hives    Family History  Problem Relation Age of Onset  . Peripheral Artery  Disease Neg Hx   . Kidney failure Neg Hx      Prior to Admission medications   Medication Sig Start Date End Date Taking? Authorizing Provider  amLODipine (NORVASC) 10 MG tablet Take 10 mg by mouth daily. 11/23/18  Yes [provider]  Aspirin-Acetaminophen-Caffeine (GOODY HEADACHE PO) Take 1 packet by mouth as needed (for headaches).   Yes [provider]  b complex-vitamin c-folic acid (NEPHRO-VITE) 0.8 MG TABS tablet Take 1 tablet by mouth every Monday, Wednesday, and Friday with hemodialysis. 11/20/18  Yes [provider]  carvedilol (COREG) 25 MG tablet Take 25 mg by mouth 2 (two) times daily. 01/04/19  Yes [provider]  cloNIDine (CATAPRES) 0.2 MG tablet Take 0.2 mg by mouth 3 (three) times daily. 10/16/18  Yes [provider]  lidocaine-prilocaine (EMLA) cream Apply 1 application topically every Monday, Wednesday, and Friday with hemodialysis.  11/20/18  Yes [provider]  RENVELA 800 MG tablet Take 1,600-3,200 mg by mouth See admin instructions. Take 3,200 mg by mouth three times a day with meals and 1,600 mg with each snack 11/20/18  Yes [provider]    Physical Exam: Vitals:   01/16/19 1930 01/16/19 1945 01/16/19 2000 01/16/19 2030  BP: (!) 155/80 136/65 (!) 156/97 (!) 135/91  Pulse: 84 89 75 82  Resp:    16  Temp:      TempSrc:      SpO2:  100% 99% 99% 100%    Constitutional: NAD, calm, comfortable Eyes: Blind ENMT: Mucous membranes are moist. Posterior pharynx clear of any exudate or lesions.Normal dentition.  Neck: normal, supple, no masses, no thyromegaly Respiratory: clear to auscultation bilaterally, no wheezing, no crackles. Normal respiratory effort. No accessory muscle use.  Cardiovascular: Regular rate and rhythm, no murmurs / rubs / gallops. No extremity edema. No carotid bruits.  Abdomen: no tenderness, no masses palpated. No hepatosplenomegaly. Bowel sounds positive.  Musculoskeletal: no  clubbing / cyanosis. No joint deformity upper and lower extremities. Good ROM, no contractures. Normal muscle tone.  Skin:  Neurologic: CN 2-12 grossly intact. Sensation intact, DTR normal. Strength 5/5 in all 4.  Psychiatric: Normal judgment and insight. Alert and oriented x 3. Normal mood.    Labs on Admission: I have personally reviewed following labs and imaging studies  CBC: Recent Labs  Lab 01/16/19 2030  WBC 8.3  NEUTROABS 6.2  HGB 9.1*  HCT 29.1*  MCV 99.0  PLT 354   Basic Metabolic Panel: Recent Labs  Lab 01/16/19 2030  NA 136  K 3.1*  CL 96*  CO2 25  GLUCOSE 90  BUN 14  CREATININE 5.28*  CALCIUM 9.5   GFR: CrCl cannot be calculated (Unknown ideal weight.). Liver Function Tests: No results for input(s): AST, ALT, ALKPHOS, BILITOT, PROT, ALBUMIN in the last 168 hours. No results for input(s): LIPASE, AMYLASE in the last 168 hours. No results for input(s): AMMONIA in the last 168 hours. Coagulation Profile: No results for input(s): INR, PROTIME in the last 168 hours. Cardiac Enzymes: No results for input(s): CKTOTAL, CKMB, CKMBINDEX, TROPONINI in the last 168 hours. BNP (last 3 results) No results for input(s): PROBNP in the last 8760 hours. HbA1C: No results for input(s): HGBA1C in the last 72 hours. CBG: No results for input(s): GLUCAP in the last 168 hours. Lipid Profile: No results for input(s): CHOL, HDL, LDLCALC, TRIG, CHOLHDL, LDLDIRECT in the last 72 hours. Thyroid Function Tests: No results for input(s): TSH, T4TOTAL, FREET4, T3FREE, THYROIDAB in the last 72 hours. Anemia Panel: No results for input(s): VITAMINB12, FOLATE, FERRITIN, TIBC, IRON, RETICCTPCT in the last 72 hours. Urine analysis: No results found for: COLORURINE, APPEARANCEUR, LABSPEC, [place], GLUCOSEU, HGBUR, BILIRUBINUR, KETONESUR, PROTEINUR, UROBILINOGEN, NITRITE, LEUKOCYTESUR  Radiological Exams on Admission: Dg Foot Complete Left  Result Date: 01/16/2019 CLINICAL DATA:   Left foot wound with black and 3rd through 5th toes. Wound present for 2 months. End-stage renal disease. EXAM: LEFT FOOT - COMPLETE 3+ VIEW COMPARISON:  Radiographs 01/15/2019. FINDINGS: Periarticular osteopenia again noted at the 2nd through 5th metatarsophalangeal joints without cortical destruction. There is no evidence of acute fracture or dislocation. There is stable spurring along the lateral base of the 1st proximal phalanx. There are no erosive changes. Forefoot soft tissue swelling appears stable without radiopaque foreign body or soft tissue emphysema. Calcaneal spurring and diffuse vascular calcifications are noted. IMPRESSION: Stable radiographs compared with prior study from yesterday. No radiographic evidence of osteomyelitis. Electronically Signed   By: Richardean Sale M.D.   On: 01/16/2019 19:35   Dg Foot Complete Left  Result Date: 01/15/2019 CLINICAL DATA:  Nonhealing wound on dorsal side of left foot. EXAM: LEFT FOOT - COMPLETE 3+ VIEW COMPARISON:  None. FINDINGS: Mild diffuse soft tissue swelling. There is periarticular osteopenia at the second through fifth MTP joints. No acute fractures, dislocations are areas of bone erosion identified. Posterior and plantar calcaneal heel spurs. IMPRESSION: 1. Soft tissue swelling. 2. No acute  bone abnormality. Electronically Signed   By: Kerby Moors M.D.   On: 01/15/2019 20:07   Vas [place] Lower Extremity Arterial Duplex  Result Date: 01/16/2019 LOWER EXTREMITY ARTERIAL DUPLEX STUDY Indications: Peripheral artery disease, and Left non-healing foot ulcer.  Vascular Interventions: Right Fem-BK Pop BPG placed 2018 per patient at outside                         facility. Current ABI:            Unknown Performing Technologist: Caralee Ates BA, RVT, RDMS  Examination Guidelines: A complete evaluation includes B-mode imaging, spectral Doppler, color Doppler, and power Doppler as needed of all accessible portions of each vessel. Bilateral testing is  considered an integral part of a complete examination. Limited examinations for reoccurring indications may be performed as noted.  Right Duplex Findings: +-----------+--------+-----+--------+----------+--------------+            PSV cm/sRatioStenosisWaveform  Comments       +-----------+--------+-----+--------+----------+--------------+ ATA Distal 56                   monophasic               +-----------+--------+-----+--------+----------+--------------+ PTA Mid    80                   biphasic                 +-----------+--------+-----+--------+----------+--------------+ PTA Distal 85                   monophasic               +-----------+--------+-----+--------+----------+--------------+ PERO Distal                               Not visualized +-----------+--------+-----+--------+----------+--------------+  Right Graft #1: Femoral artery - Below knee popliteal artery +------------------+--------+--------+---------+------------+                   PSV cm/sStenosisWaveform Comments     +------------------+--------+--------+---------+------------+ Inflow            65              triphasic             +------------------+--------+--------+---------+------------+ Prox Anastomosis  121             triphasic             +------------------+--------+--------+---------+------------+ Proximal Graft    136             triphasic             +------------------+--------+--------+---------+------------+ Mid Graft         82              triphasic             +------------------+--------+--------+---------+------------+ Distal Graft      111             triphasicValves noted +------------------+--------+--------+---------+------------+ Distal Anastomosis78              triphasic             +------------------+--------+--------+---------+------------+ Outflow           74              triphasic              +------------------+--------+--------+---------+------------+  Left Duplex Findings: +-----------+--------+-----+---------------+-------------------+--------+  PSV cm/sRatioStenosis       Waveform           Comments +-----------+--------+-----+---------------+-------------------+--------+ CFA Distal 221          50-74% stenosismonophasic                  +-----------+--------+-----+---------------+-------------------+--------+ DFA        259          50-74% stenosismonophasic                  +-----------+--------+-----+---------------+-------------------+--------+ SFA Prox   21                                                      +-----------+--------+-----+---------------+-------------------+--------+ SFA Mid                 occluded                                   +-----------+--------+-----+---------------+-------------------+--------+ SFA Distal 11                          monophasic                  +-----------+--------+-----+---------------+-------------------+--------+ POP Prox   19                          monophasic                  +-----------+--------+-----+---------------+-------------------+--------+ POP Mid    18                          monophasic                  +-----------+--------+-----+---------------+-------------------+--------+ POP Distal 15                          monophasic                  +-----------+--------+-----+---------------+-------------------+--------+ ATA Distal 15                          monophasic                  +-----------+--------+-----+---------------+-------------------+--------+ PTA Prox   16                          monophasic                  +-----------+--------+-----+---------------+-------------------+--------+ PTA Mid    16                          monophasic                  +-----------+--------+-----+---------------+-------------------+--------+ PTA Distal  21                          monophasic                  +-----------+--------+-----+---------------+-------------------+--------+ PERO Prox  12  dampened monophasic         +-----------+--------+-----+---------------+-------------------+--------+ PERO Mid   9                           monophasic                  +-----------+--------+-----+---------------+-------------------+--------+ PERO Distal12                          monophasic                  +-----------+--------+-----+---------------+-------------------+--------+  Findings reported to Vivien Rota at Dr. Dellia Nims at 4:11 pm.  Summary: Right Graft(s): Patent right femoral - below knee popliteal BPG without evidence of stenosis. Left: 50-74% stenosis noted in the common femoral artery. 50-74% stenosis noted in the deep femoral artery. Total occlusion noted in the superficial femoral artery.  See table(s) above for measurements and observations. Electronically signed by Curt Jews MD on 01/16/2019 at 8:13:50 AM.    Final     EKG: Independently reviewed.  Assessment/Plan Principal Problem:   Ischemic ulcer of left foot ([place]) Active Problems:   ESRD (end stage renal disease) ([facility])   HTN (hypertension)   Blind in both eyes   PAD (peripheral artery disease) ([place])    1. Ischemic ulcer of L foot - in setting of PAD with occluded SFA 1. Foot ulcer pathway 2. Wound care consult 3. Doesn't look grossly infected at the moment, will hold off on ABx for the moment 4. ESR, CRP 5. Vascular study results as above 6. Dr. Donnetta Hutching to see: 1. Reqs NPO after MN, possible surgery tomorrow 2. ESRD - 1. Call nephrology in AM for IP dialysis during hospital stay 3. HTN - continue home BP meds  DVT prophylaxis: SCDs Code Status: Full Family Communication: No family in room Disposition Plan: Home after admit Consults called: Dr. Donnetta Hutching Admission status: Admit to inpatient  Severity of Illness: The appropriate  patient status for this patient is INPATIENT. Inpatient status is judged to be reasonable and necessary in order to provide the required intensity of service to ensure the patient's safety. The patient's presenting symptoms, physical exam findings, and initial radiographic and laboratory data in the context of their chronic comorbidities is felt to place them at high risk for further clinical deterioration. Furthermore, it is not anticipated that the patient will be medically stable for discharge from the hospital within 2 midnights of admission. The following factors support the patient status of inpatient.   " The patient's presenting symptoms include Foot ulcer and pain, failed outpt management. " The worrisome physical exam findings include Foot ulcer, see physical exam above. " The initial radiographic and laboratory data are worrisome because of occlusion of SFA, stenosis of common and profunda femoral. " The chronic co-morbidities include ESRD on HD.   * I certify that at the point of admission it is my clinical judgment that the patient will require inpatient hospital care spanning beyond 2 midnights from the point of admission due to high intensity of service, high risk for further deterioration and high frequency of surveillance required.Etta Quill DO [facility] Pager [phone] Only works nights!  If 7AM-7PM, please contact the primary day team physician taking care of patient  [email or web address] Password [facility]  01/16/2019, 9:45 PM

## 2019-01-16 NOTE — ED Notes (Signed)
Pt is only able to move the great toe on the left foot. Toes are EXTREMELY sensitive to touch.

## 2019-01-16 NOTE — ED Triage Notes (Signed)
Per [facility]: Pt has had left foot pain for 2 months. Went to wound care dr yesterday and "they didn't do anything they just looked at it". Pt finished amoxicillin and clindamycin. Per EMS "we can see the tendons in it". Rates pain 10/10. Pt coming from dialysis, finished her 4 hour treatment.

## 2019-01-17 ENCOUNTER — Encounter (HOSPITAL_COMMUNITY)
Admission: EM | Disposition: A | Discharge status: Skilled Nursing Facility | Payer: Self-pay | Source: Home / Self Care | Attending: Family Medicine

## 2019-01-17 ENCOUNTER — Encounter (HOSPITAL_COMMUNITY): Payer: Self-pay | Admitting: General Practice

## 2019-01-17 DIAGNOSIS — I70262 Atherosclerosis of native arteries of extremities with gangrene, left leg: Secondary | ICD-10-CM

## 2019-01-17 DIAGNOSIS — N185 Chronic kidney disease, stage 5: Secondary | ICD-10-CM

## 2019-01-17 HISTORY — PX: ABDOMINAL AORTOGRAM W/LOWER EXTREMITY: CATH118223

## 2019-01-17 LAB — RENAL FUNCTION PANEL
Albumin: 3.5 g/dL (ref 3.5–5.0)
Anion gap: 13 (ref 5–15)
BUN: 19 mg/dL (ref 6–20)
CO2: 27 mmol/L (ref 22–32)
Calcium: 9.6 mg/dL (ref 8.9–10.3)
Chloride: 98 mmol/L (ref 98–111)
Creatinine, Ser: 6.93 mg/dL — ABNORMAL HIGH (ref 0.44–1.00)
GFR calc Af Amer: 8 mL/min — ABNORMAL LOW (ref >60)
GFR calc non Af Amer: 7 mL/min — ABNORMAL LOW (ref >60)
Glucose, Bld: 94 mg/dL (ref 70–99)
Phosphorus: 4 mg/dL (ref 2.5–4.6)
Potassium: 3.6 mmol/L (ref 3.5–5.1)
Sodium: 138 mmol/L (ref 135–145)

## 2019-01-17 LAB — HIV ANTIBODY (ROUTINE TESTING W REFLEX): HIV Screen 4th Generation wRfx: NONREACTIVE

## 2019-01-17 LAB — PREALBUMIN: Prealbumin: 26 mg/dL (ref 18–38)

## 2019-01-17 LAB — HEMOGLOBIN A1C
Hgb A1c MFr Bld: 5.2 % (ref 4.8–5.6)
Mean Plasma Glucose: 102.54 mg/dL

## 2019-01-17 LAB — C-REACTIVE PROTEIN: CRP: 2.2 mg/dL — ABNORMAL HIGH (ref <1.0)

## 2019-01-17 LAB — SEDIMENTATION RATE: Sed Rate: 81 mm/hr — ABNORMAL HIGH (ref 0–22)

## 2019-01-17 SURGERY — ABDOMINAL AORTOGRAM W/LOWER EXTREMITY
Anesthesia: LOCAL | Laterality: Bilateral

## 2019-01-17 MED ORDER — LABETALOL HCL 5 MG/ML IV SOLN: 10.0000 mg | INTRAVENOUS | Status: DC | PRN

## 2019-01-17 MED ORDER — SODIUM CHLORIDE 0.9% FLUSH: 3.0000 mL | INTRAVENOUS | Status: DC | PRN

## 2019-01-17 MED ORDER — IODIXANOL 320 MG/ML IV SOLN
INTRAVENOUS | Status: DC | PRN
  Administered 2019-01-17: 97 mL via INTRA_ARTERIAL

## 2019-01-17 MED ORDER — FENTANYL CITRATE (PF) 100 MCG/2ML IJ SOLN
INTRAMUSCULAR | Status: DC | PRN
  Administered 2019-01-17: 50 ug via INTRAVENOUS

## 2019-01-17 MED ORDER — SODIUM CHLORIDE 0.9 % IV SOLN
250.0000 mL | INTRAVENOUS | Status: DC | PRN
  Administered 2019-01-18 (×2): via INTRAVENOUS

## 2019-01-17 MED ORDER — CEFAZOLIN SODIUM-DEXTROSE 2-4 GM/100ML-% IV SOLN
2.0000 g | INTRAVENOUS | Status: DC
  Administered 2019-01-18: 2 g via INTRAVENOUS
  Filled 2019-01-17 (×3): qty 100

## 2019-01-17 MED ORDER — FENTANYL CITRATE (PF) 100 MCG/2ML IJ SOLN
INTRAMUSCULAR | Status: AC
  Filled 2019-01-17: qty 2

## 2019-01-17 MED ORDER — HEPARIN SODIUM (PORCINE) 5000 UNIT/ML IJ SOLN
5000.0000 [IU] | Freq: Three times a day (TID) | INTRAMUSCULAR | Status: DC
  Filled 2019-01-17: qty 1

## 2019-01-17 MED ORDER — ONDANSETRON HCL 4 MG/2ML IJ SOLN: 4.0000 mg | Freq: Four times a day (QID) | INTRAMUSCULAR | Status: DC | PRN

## 2019-01-17 MED ORDER — HEPARIN (PORCINE) IN NACL 1000-0.9 UT/500ML-% IV SOLN
INTRAVENOUS | Status: AC
  Filled 2019-01-17: qty 1000

## 2019-01-17 MED ORDER — PRO-STAT SUGAR FREE PO LIQD
30.0000 mL | Freq: Two times a day (BID) | ORAL | Status: DC
  Administered 2019-01-17 – 2019-01-26 (×17): 30 mL via ORAL
  Filled 2019-01-17 (×17): qty 30

## 2019-01-17 MED ORDER — LIDOCAINE HCL (PF) 1 % IJ SOLN
INTRAMUSCULAR | Status: AC
  Filled 2019-01-17: qty 30

## 2019-01-17 MED ORDER — HEPARIN (PORCINE) IN NACL 1000-0.9 UT/500ML-% IV SOLN
INTRAVENOUS | Status: DC | PRN
  Administered 2019-01-17 (×2): 500 mL

## 2019-01-17 MED ORDER — SODIUM CHLORIDE 0.9% FLUSH
3.0000 mL | Freq: Two times a day (BID) | INTRAVENOUS | Status: DC
  Administered 2019-01-17 – 2019-01-25 (×12): 3 mL via INTRAVENOUS

## 2019-01-17 MED ORDER — HYDRALAZINE HCL 20 MG/ML IJ SOLN: 5.0000 mg | INTRAMUSCULAR | Status: DC | PRN

## 2019-01-17 MED ORDER — OXYCODONE HCL 5 MG PO TABS
5.0000 mg | ORAL_TABLET | ORAL | Status: DC | PRN
  Administered 2019-01-19 – 2019-01-26 (×24): 10 mg via ORAL
  Filled 2019-01-17 (×24): qty 2

## 2019-01-17 MED ORDER — MIDAZOLAM HCL 2 MG/2ML IJ SOLN
INTRAMUSCULAR | Status: AC
  Filled 2019-01-17: qty 2

## 2019-01-17 MED ORDER — POVIDONE-IODINE 10 % EX SOLN
Freq: Two times a day (BID) | CUTANEOUS | Status: DC
  Administered 2019-01-17: 13:00:00 via TOPICAL
  Administered 2019-01-17: 1 via TOPICAL
  Filled 2019-01-17: qty 118

## 2019-01-17 MED ORDER — LIDOCAINE HCL (PF) 1 % IJ SOLN
INTRAMUSCULAR | Status: DC | PRN
  Administered 2019-01-17: 20 mL

## 2019-01-17 MED ORDER — MIDAZOLAM HCL 2 MG/2ML IJ SOLN
INTRAMUSCULAR | Status: DC | PRN
  Administered 2019-01-17: 1 mg via INTRAVENOUS

## 2019-01-17 SURGICAL SUPPLY — 10 items
CATH OMNI FLUSH 5F 65CM (CATHETERS) ×2 IMPLANT
CLOSURE MYNX CONTROL 5F (Vascular Products) ×2 IMPLANT
KIT MICROPUNCTURE NIT STIFF (SHEATH) ×2 IMPLANT
KIT PV (KITS) ×2 IMPLANT
SHEATH PINNACLE 5F 10CM (SHEATH) ×2 IMPLANT
SHEATH PROBE COVER 6X72 (BAG) ×2 IMPLANT
SYR MEDRAD MARK V 150ML (SYRINGE) ×2 IMPLANT
TRANSDUCER W/STOPCOCK (MISCELLANEOUS) ×2 IMPLANT
TRAY PV CATH (CUSTOM PROCEDURE TRAY) ×2 IMPLANT
WIRE BENTSON .035X145CM (WIRE) ×2 IMPLANT

## 2019-01-17 NOTE — Consult Note (Signed)
[facility]  Patient name: Rebekah Burton MRN: [id number] DOB: 03/07/1976 Sex: female  REASON FOR CONSULT: Evaluation of left leg arterial insufficiency and gangrene  HPI: Rebekah Burton is a 43 y.o. female, who is seen for evaluation of gangrene of her left foot.  She is a very pleasant 43 year old with end-stage renal disease.  She presents with extensive gangrene in the distal portion of her left lateral foot.  She is blind and does not know how long this is been going on.  She reports significant pain associated with this.  He presented to the emergency room last night and was admitted for pain control and evaluation.  She will undergo hemodialysis and her usual schedule.  She has a history of similar event and underwent a left femoral to below-knee popliteal bypass in [facility] in 2018 and had successful right transmetatarsal amputation.  Past Medical History:  Diagnosis Date  . Blind   . ESRD (end stage renal disease) ([place])   . Hypertension   . PAD (peripheral artery disease) ([facility])     Family History  Problem Relation Age of Onset  . Peripheral Artery Disease Neg Hx   . Kidney failure Neg Hx     SOCIAL HISTORY: Social History   Socioeconomic History  . Marital status: Single    Spouse name: Not on file  . Number of children: Not on file  . Years of education: Not on file  . Highest education level: Not on file  Occupational History  . Not on file  Social Needs  . Financial resource strain: Not on file  . Food insecurity:    Worry: Not on file    Inability: Not on file  . Transportation needs:    Medical: Not on file    Non-medical: Not on file  Tobacco Use  . Smoking status: Current Every Day Smoker  . Tobacco comment: <1 PPD  Substance and Sexual Activity  . Alcohol use: Never    Frequency: Never  . Drug use: Not on file  . Sexual activity: Never  Lifestyle  . Physical activity:     Days per week: Not on file    Minutes per session: Not on file  . Stress: Not on file  Relationships  . Social connections:    Talks on phone: Not on file    Gets together: Not on file    Attends religious service: Not on file    Active member of club or organization: Not on file    Attends meetings of clubs or organizations: Not on file    Relationship status: Not on file  . Intimate partner violence:    Fear of current or ex partner: Not on file    Emotionally abused: Not on file    Physically abused: Not on file    Forced sexual activity: Not on file  Other Topics Concern  . Not on file  Social History Narrative  . Not on file    Allergies  Allergen Reactions  . Percocet [Oxycodone-Acetaminophen] Hives    Current Facility-Administered Medications  Medication Dose Route Frequency Provider Last Rate Last Dose  . acetaminophen (TYLENOL) tablet 650 mg  650 mg Oral Q6H PRN Etta Quill, DO       Or  . acetaminophen (TYLENOL) suppository 650 mg  650 mg Rectal Q6H PRN Etta Quill, DO      . amLODipine (NORVASC) tablet 10 mg  10 mg Oral Daily  Etta Quill, DO      . carvedilol (COREG) tablet 25 mg  25 mg Oral BID WC Jennette Kettle M, DO   25 mg at 01/16/19 2311  . cloNIDine (CATAPRES) tablet 0.2 mg  0.2 mg Oral TID Etta Quill, DO   0.2 mg at 01/16/19 2311  . fentaNYL (SUBLIMAZE) injection 50 mcg  50 mcg Intravenous Q2H PRN Etta Quill, DO   50 mcg at 01/17/19 0514  . multivitamin (RENA-VIT) tablet 1 tablet  1 tablet Oral QHS Etta Quill, DO   1 tablet at 01/16/19 2311  . ondansetron (ZOFRAN) tablet 4 mg  4 mg Oral Q6H PRN Etta Quill, DO       Or  . ondansetron [facility]) injection 4 mg  4 mg Intravenous Q6H PRN Etta Quill, DO      . sevelamer carbonate (RENVELA) tablet 1,600 mg  1,600 mg Oral PRN Etta Quill, DO      . sevelamer carbonate (RENVELA) tablet 3,200 mg  3,200 mg Oral TID WC Etta Quill, DO        REVIEW OF  SYSTEMS:  Reviewed in her history and physical with nothing to add  PHYSICAL EXAM: Vitals:   01/16/19 2145 01/16/19 2200 01/16/19 2246 01/17/19 0300  BP: 117/82 122/78 (!) 146/99   Pulse: 77 78 78   Resp:   19   Temp:   98.1 F (36.7 C) 98.6 F (37 C)  TempSrc:   Oral Oral  SpO2: 99% 100% 100%   Weight:   89.9 kg   Height:   5\' 4"  (1.626 m)     GENERAL: The patient is a well-nourished female, in no acute distress. The vital signs are documented above. CARDIOVASCULAR: 1+ left femoral pulse.  Absent left popliteal and distal pulses. 2+ right popliteal graft pulse and 2+ dorsalis pedis pulse above her transmetatarsal amputation PULMONARY: There is good air exchange  ABDOMEN: Soft and non-tender  MUSCULOSKELETAL: There are no major deformities or cyanosis.  Healed right transmetatarsal amputation NEUROLOGIC: No focal weakness or paresthesias are detected. SKIN: There are no ulcers or rashes noted.  Full-thickness extensive gangrene over the distal left dorsum of her foot.  This is from her second toe to her fifth toe. PSYCHIATRIC: The patient has a normal affect.  DATA:  Plex revealed stenosis in her common femoral artery and profundus femoris artery.  Also a complete occlusion of her superficial femoral artery on the left  MEDICAL ISSUES: Extensive gangrenous changes left foot.  Discussed this at length with the patient.  At minimum will require a left transmetatarsal amputation.  Will undergo arteriography for today if possible.  Explained to the patient that the schedule is extremely busy today and will attempt to get this on today.  If not we will have arteriography tomorrow.  Will add Ancef for soft tissue coverage.  She does not have a white count or fever but does have extensive necrosis in her foot.   Rosetta Posner, MD FACS [facility] Tel [phone] Pager [phone]

## 2019-01-17 NOTE — Progress Notes (Signed)
[facility] PROGRESS NOTE  Karrah Mangini EWY:[id number] DOB: 11/10/1976 DOA: 01/16/2019 PCP: Patient, [name]   Narrative: 43 year old female previously living in [place] now lives here ESRD on dialysis for probably the past 10 years secondary to HTN Monday Wednesday Friday Legally blind Peripheral arterial disease with known left small to popliteal bypass 2018 and successful right trans-met amputation  Admitted 1/22 with worsening left foot pain from wound clinic and arterial studies showed stenosis of common and deep femoral and complete occlusion SFA   A & Plan ESRD MWF-needs nonemergent input from nephrology will need dialysis this admission Legally blind-is able to manage somehow-lives with niece expect may return to home on discharge will have to see what transpires PAD with gangrene-severe rest pain-angiogram to be done at some point today if not then will probably be done tomorrow is n.p.o. for procedure-I am okay with patient eating once procedures none renal diet Suspect he may require some form of amputation given redness of wound hopefully can save foot HTN-continue amlodipine 10 Coreg 25 twice daily clonidine 0.2 3 times daily Metabolic bone disease-continue Renvela 3.2 g 3 times daily 1600 mg with snack   Lovenox, inpatient-no family present at this time Presumed full code  Verlon Au, MD  [facility] [address] [place] [email or web address] 7PM-7AM contact night coverage as above 01/17/2019, 10:56 AM  LOS: 1 day   Consultants:  Vascular  Nephrology  Procedures:  None yet  Antimicrobials:  Ancef Monday Wednesday Friday dialysis  Interval history/Subjective: Awake alert coherent severe pain when leg is moved No other issue no fever no chills coherent  Objective:  Vitals:  Vitals:   01/16/19 2246 01/17/19 0300  BP: (!) 146/99   Pulse: 78   Resp: 19   Temp: 98.1 F (36.7 C) 98.6 F (37 C)  SpO2: 100%     Exam:  EOMI NCAT no distress,  chest is clear, abdomen is soft nontender no rebound no guarding, left leg slightly swollen below knee very tender even above area of wound in the calf region Neurologically intact Is able to see weekly   I have personally reviewed the following:  DATA   Labs:  BUN/creatinine 19/6.6  WBC 8.3 sed rate 81  CRP 2.2   Scheduled Meds: . amLODipine  10 mg Oral Daily  . carvedilol  25 mg Oral BID WC  . cloNIDine  0.2 mg Oral TID  . multivitamin  1 tablet Oral QHS  . sevelamer carbonate  3,200 mg Oral TID WC   Continuous Infusions: .  ceFAZolin (ANCEF) IV      Principal Problem:   Ischemic ulcer of left foot ([facility]) Active Problems:   ESRD (end stage renal disease) ([facility])   HTN (hypertension)   Blind in both eyes   PAD (peripheral artery disease) ([place])   LOS: 1 day

## 2019-01-17 NOTE — Progress Notes (Signed)
Pt arrived from ED to 4E21. CHG, vitals, and initial assessment complete. Oriented to the room and call bell system. Will continue to monitor.

## 2019-01-17 NOTE — Consult Note (Signed)
[place] Nurse wound consult note Reason for Consult:left dorsal foot full thickness wound Wound type:arterial and infectious, gangrenous  Pressure Injury POA: NA Measurement:4cm x 4cm x 0.5cm open area Wound NID:POEUM eschar 100% Drainage (amount, consistency, odor) scant from small area Periwound:dry, intact Dressing procedure/placement/frequency: Pt has been seen this am by Dr. Donnetta Hutching and a plan has been made for tests as well as probable OR. Pt has no elevation of white count, no fever present.  Have provided orders for topical betadine, wrap with dry gauze until pt goes to OR. Nutritional level low normal. We will not follow, but will remain available to this patient, to nursing, and the medical and/or surgical teams.  Please re-consult if we need to assist further.  Fara Olden, RN-C, WTA-C, [facility]

## 2019-01-17 NOTE — Progress Notes (Signed)
Initial Nutrition Assessment  DOCUMENTATION CODES:   Obesity unspecified  INTERVENTION:    Prostat liquid protein po 30 ml BID with meals, each supplement provides 100 kcal, 15 grams protein  NUTRITION DIAGNOSIS:   Increased nutrient needs related to wound healing as evidenced by estimated needs  GOAL:   Patient will meet greater than or equal to 90% of their needs  MONITOR:   PO intake, Supplement acceptance, Labs, Skin, Weight trends  REASON FOR ASSESSMENT:   Consult Wound healing  ASSESSMENT:   43 yo Female with PMH of ESRD on HD, PVD; admitted with worsening left foot pain from wound clinic and arterial studies showed stenosis of common and deep femoral and complete occlusion SFA.  RD unable to obtain nutrition hx at this time. Pt is currently in CATH LAB for aortogram.  No nutrition problems identified PTA per chart review. Prior to NPO status pt was on a Heart Healthy diet. No % meal completion available in flowsheets.  Pt would benefit from liquid protein supplement. Medications include Rena-Vit and Renvela. Labs reviewed. Cr 6.93 (H).  NUTRITION - FOCUSED PHYSICAL EXAM:  Unable to assess at this time  Diet Order:   Diet Order            Diet NPO time specified Except for: Sips with Meds  Diet effective midnight             EDUCATION NEEDS:   Not appropriate for education at this time  Skin:  Skin Assessment: Skin Integrity Issues: Skin Integrity Issues:: Unstageable Unstageable: L foot  Last BM:  1/23  Height:   Ht Readings from Last 1 Encounters:  01/16/19 5\' 4"  (1.626 m)   Weight:   Wt Readings from Last 1 Encounters:  01/16/19 89.9 kg   BMI:  Body mass index is 34.02 kg/m.  Estimated Nutritional Needs:   Kcal:  1800-2000  Protein:  100-115 gm  Fluid:  1.8-2.0 L  Arthur Holms, RD, LDN Pager #: [phone] After-Hours Pager #: [phone]

## 2019-01-17 NOTE — Op Note (Signed)
    Patient name: Rebekah Burton MRN: [id number] DOB: March 24, 1976 Sex: female  01/17/2019 Pre-operative Diagnosis: Critical left lower extremity ischemia with wound Post-operative diagnosis:  Same Surgeon:  Erlene Quan C. Donzetta Matters, MD Procedure Performed: 1.  Ultrasound-guided cannulation right common femoral artery 2.  Aortogram bilateral lower extremity runoff 3.  Minx device closure right common femoral artery 4.  Moderate sedation with fentanyl Versed for 22 minutes  Indications: 43 year old female is undergone right femoral to popliteal artery bypass for wound and is now healed transmetatarsal amputation.  She now has gangrenous changes to her left fourth and fifth toes at least without palpable popliteal pulse.  She is indicated for angiogram possible intervention on the left.  Findings: There is significant scarring in the right groin.  Aorta and iliac segments are free of disease.  Side of concern is the left side where the common femoral artery is severely diseased the SFA is flush occluded.  There is an [employer] she then reconstitutes below-knee popliteal artery with at least runoff via the anterior tibial artery to the foot.  Posterior tibial artery is possibly patent all the way although the TP trunk was insufficiently evaluated given timing of contrast.  The right lower extremity bypass is patent with what appears to be three-vessel runoff to the foot.  Patient will need left femoral to below-knee popliteal artery bypass.   Procedure:  The patient was identified in the holding area and taken to room 8.  The patient was then placed supine on the table and prepped and draped in the usual sterile fashion.  A time out was called.  Ultrasound was used to evaluate the right common femoral artery where there is noted to be a patulous segment consistent with previous patch angioplasty.  There was significant scar tissue there as well.  This area was anesthetized 1% lidocaine.  It was cannulated with  direct ultrasound visualization with micropuncture needle followed by wire and sheath.  An image was saved the permanent record.  A Bentson wire was placed in the tract was dilated.  5 French sheath was then placed an Omni catheter placed to level of L1.  Aortogram followed by bilateral lower extremity runoff was performed.  With the above findings she will need left femoral to popliteal artery bypass with common femoral endarterectomy most likely as well as transmetatarsal amputation versus amputation of at least toes 4 and 5 on the left.  I discussed this plan with her.  Minx device was deployed in her groin which she did tolerate well.  There were no immediate complications.  Contrast: 97 cc   Gergory Biello C. Donzetta Matters, MD [facility] Office: [phone] Pager: [phone]

## 2019-01-17 NOTE — Progress Notes (Signed)
   Patient presents for aortogram bilateral lower extremity runoff possible invention on the left.  I discussed risk benefits and alternatives.  She will need some amount of foot debridement possible transmetatarsal amputation of the left.  There is a palpable dorsalis pedis pulse on the right.  Consent is signed  Eda Paschal. Donzetta Matters, MD [facility] Office: [phone] Pager: [phone]

## 2019-01-18 ENCOUNTER — Inpatient Hospital Stay (HOSPITAL_COMMUNITY): Payer: Medicare Other | Admitting: Certified Registered Nurse Anesthetist

## 2019-01-18 ENCOUNTER — Encounter (HOSPITAL_COMMUNITY): Payer: Medicare Other

## 2019-01-18 ENCOUNTER — Encounter (HOSPITAL_COMMUNITY)
Admission: EM | Disposition: A | Discharge status: Skilled Nursing Facility | Payer: Self-pay | Source: Home / Self Care | Attending: Family Medicine

## 2019-01-18 ENCOUNTER — Encounter (HOSPITAL_COMMUNITY): Payer: Self-pay | Admitting: Vascular Surgery

## 2019-01-18 HISTORY — PX: ENDARTERECTOMY FEMORAL: SHX5804

## 2019-01-18 HISTORY — PX: FEMORAL-POPLITEAL BYPASS GRAFT: SHX937

## 2019-01-18 HISTORY — PX: TRANSMETATARSAL AMPUTATION: SHX6197

## 2019-01-18 LAB — RENAL FUNCTION PANEL
Albumin: 3.1 g/dL — ABNORMAL LOW (ref 3.5–5.0)
Anion gap: 12 (ref 5–15)
BUN: 35 mg/dL — ABNORMAL HIGH (ref 6–20)
CO2: 24 mmol/L (ref 22–32)
Calcium: 9.5 mg/dL (ref 8.9–10.3)
Chloride: 100 mmol/L (ref 98–111)
Creatinine, Ser: 9.23 mg/dL — ABNORMAL HIGH (ref 0.44–1.00)
GFR calc Af Amer: 6 mL/min — ABNORMAL LOW (ref >60)
GFR calc non Af Amer: 5 mL/min — ABNORMAL LOW (ref >60)
Glucose, Bld: 94 mg/dL (ref 70–99)
Phosphorus: 5.2 mg/dL — ABNORMAL HIGH (ref 2.5–4.6)
Potassium: 3.8 mmol/L (ref 3.5–5.1)
Sodium: 136 mmol/L (ref 135–145)

## 2019-01-18 LAB — CBC
HCT: 28.5 % — ABNORMAL LOW (ref 36.0–46.0)
Hemoglobin: 8.9 g/dL — ABNORMAL LOW (ref 12.0–15.0)
MCH: 30.7 pg (ref 26.0–34.0)
MCHC: 31.2 g/dL (ref 30.0–36.0)
MCV: 98.3 fL (ref 80.0–100.0)
Platelets: 272 10*3/uL (ref 150–400)
RBC: 2.9 MIL/uL — ABNORMAL LOW (ref 3.87–5.11)
RDW: 15 % (ref 11.5–15.5)
WBC: 7.6 10*3/uL (ref 4.0–10.5)
nRBC: 0 % (ref 0.0–0.2)

## 2019-01-18 LAB — SURGICAL PCR SCREEN
MRSA, PCR: POSITIVE — AB
Staphylococcus aureus: POSITIVE — AB

## 2019-01-18 LAB — HCG, SERUM, QUALITATIVE: Preg, Serum: NEGATIVE

## 2019-01-18 LAB — ABO/RH: ABO/RH(D): O POS

## 2019-01-18 SURGERY — ENDARTERECTOMY, FEMORAL
Anesthesia: General | Site: Leg Lower | Laterality: Left

## 2019-01-18 MED ORDER — ONDANSETRON HCL 4 MG/2ML IJ SOLN
INTRAMUSCULAR | Status: DC | PRN
  Administered 2019-01-18 (×2): 4 mg via INTRAVENOUS

## 2019-01-18 MED ORDER — ROCURONIUM BROMIDE 50 MG/5ML IV SOSY
PREFILLED_SYRINGE | INTRAVENOUS | Status: DC | PRN
  Administered 2019-01-18 (×2): 10 mg via INTRAVENOUS
  Administered 2019-01-18: 50 mg via INTRAVENOUS
  Administered 2019-01-18: 20 mg via INTRAVENOUS

## 2019-01-18 MED ORDER — PROMETHAZINE HCL 25 MG/ML IJ SOLN: 6.2500 mg | INTRAMUSCULAR | Status: DC | PRN

## 2019-01-18 MED ORDER — ROCURONIUM BROMIDE 50 MG/5ML IV SOSY
PREFILLED_SYRINGE | INTRAVENOUS | Status: AC
  Filled 2019-01-18: qty 5

## 2019-01-18 MED ORDER — SENNOSIDES-DOCUSATE SODIUM 8.6-50 MG PO TABS: 1.0000 | ORAL_TABLET | Freq: Every evening | ORAL | Status: DC | PRN

## 2019-01-18 MED ORDER — POTASSIUM CHLORIDE CRYS ER 20 MEQ PO TBCR: 20.0000 meq | EXTENDED_RELEASE_TABLET | Freq: Every day | ORAL | Status: DC | PRN

## 2019-01-18 MED ORDER — MAGNESIUM SULFATE 2 GM/50ML IV SOLN: 2.0000 g | Freq: Every day | INTRAVENOUS | Status: DC | PRN

## 2019-01-18 MED ORDER — EPHEDRINE 5 MG/ML INJ
INTRAVENOUS | Status: AC
  Filled 2019-01-18: qty 10

## 2019-01-18 MED ORDER — HYDROMORPHONE HCL 1 MG/ML IJ SOLN
0.2500 mg | INTRAMUSCULAR | Status: DC | PRN
  Administered 2019-01-18 (×4): 0.5 mg via INTRAVENOUS

## 2019-01-18 MED ORDER — PANTOPRAZOLE SODIUM 40 MG PO TBEC
40.0000 mg | DELAYED_RELEASE_TABLET | Freq: Every day | ORAL | Status: DC
  Administered 2019-01-18 – 2019-01-26 (×9): 40 mg via ORAL
  Filled 2019-01-18 (×9): qty 1

## 2019-01-18 MED ORDER — HYDROMORPHONE HCL 1 MG/ML IJ SOLN
INTRAMUSCULAR | Status: AC
  Filled 2019-01-18: qty 1

## 2019-01-18 MED ORDER — DOCUSATE SODIUM 100 MG PO CAPS
100.0000 mg | ORAL_CAPSULE | Freq: Every day | ORAL | Status: DC
  Administered 2019-01-19 – 2019-01-25 (×5): 100 mg via ORAL
  Filled 2019-01-18 (×8): qty 1

## 2019-01-18 MED ORDER — SODIUM CHLORIDE 0.9 % IV SOLN: 500.0000 mL | Freq: Once | INTRAVENOUS | Status: DC | PRN

## 2019-01-18 MED ORDER — FENTANYL CITRATE (PF) 250 MCG/5ML IJ SOLN
INTRAMUSCULAR | Status: AC
  Filled 2019-01-18: qty 5

## 2019-01-18 MED ORDER — PROTAMINE SULFATE 10 MG/ML IV SOLN
INTRAVENOUS | Status: AC
  Filled 2019-01-18: qty 5

## 2019-01-18 MED ORDER — HEPARIN SODIUM (PORCINE) 1000 UNIT/ML IJ SOLN
INTRAMUSCULAR | Status: DC | PRN
  Administered 2019-01-18: 9000 [IU] via INTRAVENOUS

## 2019-01-18 MED ORDER — PHENOL 1.4 % MT LIQD: 1.0000 | OROMUCOSAL | Status: DC | PRN

## 2019-01-18 MED ORDER — SUGAMMADEX SODIUM 200 MG/2ML IV SOLN
INTRAVENOUS | Status: DC | PRN
  Administered 2019-01-18: 200 mg via INTRAVENOUS

## 2019-01-18 MED ORDER — PROPOFOL 10 MG/ML IV BOLUS
INTRAVENOUS | Status: DC | PRN
  Administered 2019-01-18: 150 mg via INTRAVENOUS

## 2019-01-18 MED ORDER — MUPIROCIN 2 % EX OINT
TOPICAL_OINTMENT | CUTANEOUS | Status: AC
  Administered 2019-01-18: 1 via TOPICAL
  Filled 2019-01-18: qty 22

## 2019-01-18 MED ORDER — EPHEDRINE SULFATE-NACL 50-0.9 MG/10ML-% IV SOSY
PREFILLED_SYRINGE | INTRAVENOUS | Status: DC | PRN
  Administered 2019-01-18: 10 mg via INTRAVENOUS

## 2019-01-18 MED ORDER — GUAIFENESIN-DM 100-10 MG/5ML PO SYRP: 15.0000 mL | ORAL_SOLUTION | ORAL | Status: DC | PRN

## 2019-01-18 MED ORDER — PHENYLEPHRINE 40 MCG/ML (10ML) SYRINGE FOR IV PUSH (FOR BLOOD PRESSURE SUPPORT)
PREFILLED_SYRINGE | INTRAVENOUS | Status: DC | PRN
  Administered 2019-01-18 (×5): 80 ug via INTRAVENOUS

## 2019-01-18 MED ORDER — ONDANSETRON HCL 4 MG/2ML IJ SOLN
INTRAMUSCULAR | Status: AC
  Filled 2019-01-18: qty 2

## 2019-01-18 MED ORDER — HEPARIN SODIUM (PORCINE) 1000 UNIT/ML IJ SOLN
INTRAMUSCULAR | Status: AC
  Filled 2019-01-18: qty 1

## 2019-01-18 MED ORDER — LIDOCAINE 2% (20 MG/ML) 5 ML SYRINGE
INTRAMUSCULAR | Status: AC
  Filled 2019-01-18: qty 5

## 2019-01-18 MED ORDER — PHENYLEPHRINE 40 MCG/ML (10ML) SYRINGE FOR IV PUSH (FOR BLOOD PRESSURE SUPPORT)
PREFILLED_SYRINGE | INTRAVENOUS | Status: AC
  Filled 2019-01-18: qty 10

## 2019-01-18 MED ORDER — FENTANYL CITRATE (PF) 100 MCG/2ML IJ SOLN
INTRAMUSCULAR | Status: DC | PRN
  Administered 2019-01-18: 50 ug via INTRAVENOUS
  Administered 2019-01-18: 25 ug via INTRAVENOUS
  Administered 2019-01-18 (×2): 50 ug via INTRAVENOUS
  Administered 2019-01-18: 25 ug via INTRAVENOUS
  Administered 2019-01-18 (×2): 50 ug via INTRAVENOUS
  Administered 2019-01-18: 100 ug via INTRAVENOUS
  Administered 2019-01-18 (×2): 50 ug via INTRAVENOUS

## 2019-01-18 MED ORDER — ALUM & MAG HYDROXIDE-SIMETH 200-200-20 MG/5ML PO SUSP: 15.0000 mL | ORAL | Status: DC | PRN

## 2019-01-18 MED ORDER — DEXAMETHASONE SODIUM PHOSPHATE 10 MG/ML IJ SOLN
INTRAMUSCULAR | Status: DC | PRN
  Administered 2019-01-18: 10 mg via INTRAVENOUS

## 2019-01-18 MED ORDER — LIDOCAINE 2% (20 MG/ML) 5 ML SYRINGE
INTRAMUSCULAR | Status: DC | PRN
  Administered 2019-01-18: 60 mg via INTRAVENOUS

## 2019-01-18 MED ORDER — HEPARIN SODIUM (PORCINE) 5000 UNIT/ML IJ SOLN
5000.0000 [IU] | Freq: Three times a day (TID) | INTRAMUSCULAR | Status: DC
  Filled 2019-01-18 (×3): qty 1

## 2019-01-18 MED ORDER — SODIUM CHLORIDE 0.9 % IV SOLN
INTRAVENOUS | Status: DC | PRN
  Administered 2019-01-18: 50 ug/min via INTRAVENOUS

## 2019-01-18 MED ORDER — PROPOFOL 10 MG/ML IV BOLUS
INTRAVENOUS | Status: AC
  Filled 2019-01-18: qty 20

## 2019-01-18 MED ORDER — CEFAZOLIN SODIUM 1 G IJ SOLR
INTRAMUSCULAR | Status: AC
  Filled 2019-01-18: qty 20

## 2019-01-18 MED ORDER — SODIUM CHLORIDE 0.9 % IV SOLN: INTRAVENOUS | Status: DC

## 2019-01-18 MED ORDER — SODIUM CHLORIDE 0.9 % IV SOLN
INTRAVENOUS | Status: DC | PRN
  Administered 2019-01-18: 10:00:00

## 2019-01-18 MED ORDER — PROTAMINE SULFATE 10 MG/ML IV SOLN
INTRAVENOUS | Status: DC | PRN
  Administered 2019-01-18: 50 mg via INTRAVENOUS

## 2019-01-18 MED ORDER — BISACODYL 5 MG PO TBEC: 5.0000 mg | DELAYED_RELEASE_TABLET | Freq: Every day | ORAL | Status: DC | PRN

## 2019-01-18 MED ORDER — HYDROMORPHONE HCL 1 MG/ML IJ SOLN
0.5000 mg | INTRAMUSCULAR | Status: DC | PRN
  Administered 2019-01-18 – 2019-01-25 (×35): 1 mg via INTRAVENOUS
  Filled 2019-01-18 (×31): qty 1

## 2019-01-18 MED ORDER — 0.9 % SODIUM CHLORIDE (POUR BTL) OPTIME
TOPICAL | Status: DC | PRN
  Administered 2019-01-18: 1000 mL

## 2019-01-18 MED ORDER — DEXAMETHASONE SODIUM PHOSPHATE 10 MG/ML IJ SOLN
INTRAMUSCULAR | Status: AC
  Filled 2019-01-18: qty 1

## 2019-01-18 MED ORDER — MUPIROCIN 2 % EX OINT
1.0000 "application " | TOPICAL_OINTMENT | Freq: Once | CUTANEOUS | Status: AC
  Administered 2019-01-18: 1 via TOPICAL

## 2019-01-18 SURGICAL SUPPLY — 89 items
BANDAGE ACE 4X5 VEL STRL LF (GAUZE/BANDAGES/DRESSINGS) ×4 IMPLANT
BANDAGE ESMARK 6X9 LF (GAUZE/BANDAGES/DRESSINGS) IMPLANT
BLADE AVERAGE 25X9 (BLADE) ×4 IMPLANT
BLADE SAW SGTL 81X20 HD (BLADE) IMPLANT
BNDG CONFORM 3 STRL LF (GAUZE/BANDAGES/DRESSINGS) IMPLANT
BNDG ESMARK 6X9 LF (GAUZE/BANDAGES/DRESSINGS)
BNDG GAUZE ELAST 4 BULKY (GAUZE/BANDAGES/DRESSINGS) ×4 IMPLANT
CANISTER SUCT 3000ML PPV (MISCELLANEOUS) ×4 IMPLANT
CANNULA VESSEL 3MM 2 BLNT TIP (CANNULA) IMPLANT
CLIP LIGATING EXTRA MED SLVR (CLIP) ×4 IMPLANT
CLIP VESOCCLUDE MED 24/CT (CLIP) ×8 IMPLANT
CLIP VESOCCLUDE SM WIDE 24/CT (CLIP) ×8 IMPLANT
COVER PROBE W GEL 5X96 (DRAPES) ×4 IMPLANT
COVER SURGICAL LIGHT HANDLE (MISCELLANEOUS) ×4 IMPLANT
COVER WAND RF STERILE (DRAPES) ×4 IMPLANT
CUFF TOURNIQUET SINGLE 24IN (TOURNIQUET CUFF) IMPLANT
CUFF TOURNIQUET SINGLE 34IN LL (TOURNIQUET CUFF) IMPLANT
CUFF TOURNIQUET SINGLE 44IN (TOURNIQUET CUFF) IMPLANT
DERMABOND ADVANCED (GAUZE/BANDAGES/DRESSINGS) ×2
DERMABOND ADVANCED .7 DNX12 (GAUZE/BANDAGES/DRESSINGS) ×6 IMPLANT
DRAIN CHANNEL 15F RND FF W/TCR (WOUND CARE) IMPLANT
DRAPE C-ARM 42X72 X-RAY (DRAPES) IMPLANT
DRAPE HALF SHEET 40X57 (DRAPES) ×4 IMPLANT
DRAPE X-RAY CASS 24X20 (DRAPES) IMPLANT
DRSG ADAPTIC 3X8 NADH LF (GAUZE/BANDAGES/DRESSINGS) ×4 IMPLANT
ELECT CAUTERY BLADE 6.4 (BLADE) ×4 IMPLANT
ELECT REM PT RETURN 9FT ADLT (ELECTROSURGICAL) ×4
ELECTRODE REM PT RTRN 9FT ADLT (ELECTROSURGICAL) ×3 IMPLANT
EVACUATOR SILICONE 100CC (DRAIN) IMPLANT
GAUZE 4X4 16PLY RFD (DISPOSABLE) ×4 IMPLANT
GAUZE SPONGE 4X4 12PLY STRL (GAUZE/BANDAGES/DRESSINGS) ×4 IMPLANT
GLOVE BIO SURGEON STRL SZ7 (GLOVE) ×16 IMPLANT
GLOVE BIO SURGEON STRL SZ7.5 (GLOVE) ×8 IMPLANT
GLOVE BIOGEL PI IND STRL 6.5 (GLOVE) ×3 IMPLANT
GLOVE BIOGEL PI IND STRL 7.0 (GLOVE) ×15 IMPLANT
GLOVE BIOGEL PI IND STRL 8 (GLOVE) ×3 IMPLANT
GLOVE BIOGEL PI INDICATOR 6.5 (GLOVE) ×1
GLOVE BIOGEL PI INDICATOR 7.0 (GLOVE) ×5
GLOVE BIOGEL PI INDICATOR 8 (GLOVE) ×1
GLOVE SURG SS PI 6.5 STRL IVOR (GLOVE) ×16 IMPLANT
GLOVE SURG SS PI 7.0 STRL IVOR (GLOVE) ×4 IMPLANT
GOWN STRL REUS W/ TWL LRG LVL3 (GOWN DISPOSABLE) ×12 IMPLANT
GOWN STRL REUS W/ TWL XL LVL3 (GOWN DISPOSABLE) ×3 IMPLANT
GOWN STRL REUS W/TWL LRG LVL3 (GOWN DISPOSABLE) ×4
GOWN STRL REUS W/TWL XL LVL3 (GOWN DISPOSABLE) ×1
HEMOSTAT SPONGE AVITENE ULTRA (HEMOSTASIS) IMPLANT
INSERT FOGARTY SM (MISCELLANEOUS) IMPLANT
KIT BASIN OR (CUSTOM PROCEDURE TRAY) ×4 IMPLANT
KIT TURNOVER KIT B (KITS) ×4 IMPLANT
LOOP VESSEL MAXI BLUE (MISCELLANEOUS) ×4 IMPLANT
LOOP VESSEL MINI RED (MISCELLANEOUS) ×4 IMPLANT
MARKER GRAFT CORONARY BYPASS (MISCELLANEOUS) IMPLANT
NEEDLE HYPO 25GX1X1/2 BEV (NEEDLE) IMPLANT
NS IRRIG 1000ML POUR BTL (IV SOLUTION) ×8 IMPLANT
PACK GENERAL/GYN (CUSTOM PROCEDURE TRAY) ×4 IMPLANT
PACK PERIPHERAL VASCULAR (CUSTOM PROCEDURE TRAY) ×4 IMPLANT
PAD ARMBOARD 7.5X6 YLW CONV (MISCELLANEOUS) ×8 IMPLANT
PENCIL BUTTON HOLSTER BLD 10FT (ELECTRODE) ×4 IMPLANT
SET COLLECT BLD 21X3/4 12 (NEEDLE) IMPLANT
SPECIMEN JAR SMALL (MISCELLANEOUS) ×4 IMPLANT
SPONGE LAP 18X18 RF (DISPOSABLE) ×4 IMPLANT
STAPLER VISISTAT 35W (STAPLE) ×4 IMPLANT
STOPCOCK 4 WAY LG BORE MALE ST (IV SETS) IMPLANT
SUT ETHILON 2 0 FS 18 (SUTURE) ×8 IMPLANT
SUT ETHILON 3 0 PS 1 (SUTURE) ×4 IMPLANT
SUT GORETEX 6.0 TT13 (SUTURE) IMPLANT
SUT GORETEX 6.0 TT9 (SUTURE) IMPLANT
SUT MNCRL AB 4-0 PS2 18 (SUTURE) ×16 IMPLANT
SUT PROLENE 5 0 C 1 24 (SUTURE) ×4 IMPLANT
SUT PROLENE 6 0 BV (SUTURE) ×8 IMPLANT
SUT PROLENE 6 0 CC (SUTURE) ×12 IMPLANT
SUT PROLENE 7 0 BV 1 (SUTURE) IMPLANT
SUT SILK 2 0 (SUTURE) ×1
SUT SILK 2 0 SH (SUTURE) ×12 IMPLANT
SUT SILK 2-0 18XBRD TIE 12 (SUTURE) ×3 IMPLANT
SUT SILK 3 0 (SUTURE) ×2
SUT SILK 3-0 18XBRD TIE 12 (SUTURE) ×6 IMPLANT
SUT SILK 4 0 (SUTURE) ×1
SUT SILK 4-0 18XBRD TIE 12 (SUTURE) ×3 IMPLANT
SUT VIC AB 2-0 CT1 27 (SUTURE) ×3
SUT VIC AB 2-0 CT1 TAPERPNT 27 (SUTURE) ×9 IMPLANT
SUT VIC AB 3-0 SH 27 (SUTURE) ×7
SUT VIC AB 3-0 SH 27X BRD (SUTURE) ×21 IMPLANT
SYR CONTROL 10ML LL (SYRINGE) IMPLANT
TOWEL GREEN STERILE (TOWEL DISPOSABLE) ×8 IMPLANT
TOWEL GREEN STERILE FF (TOWEL DISPOSABLE) ×4 IMPLANT
TRAY FOLEY MTR SLVR 16FR STAT (SET/KITS/TRAYS/PACK) ×4 IMPLANT
UNDERPAD 30X30 (UNDERPADS AND DIAPERS) ×4 IMPLANT
WATER STERILE IRR 1000ML POUR (IV SOLUTION) ×4 IMPLANT

## 2019-01-18 NOTE — Progress Notes (Signed)
Patient received from PACU, A&Ox4, VSS. Telemetry applied and CCMD notified. 

## 2019-01-18 NOTE — Progress Notes (Signed)
Pt has refused heparin subcutaneous injections. She has been educated on the benefits, but continues to refuse d/t the bruises she gets from it. Patient did agree to wear SCD hose. Hose have been applied. Will continue to monitor. Lajoyce Corners, RN

## 2019-01-18 NOTE — Progress Notes (Signed)
Patient ID: Rebekah Burton, female   DOB: 10/24/1976, 43 y.o.   MRN: [id number] Comfortable this morning.  Gust need for revascularization for limb salvage attempt.  For left femoral endarterectomy and left femoral to below-knee popliteal bypass.  Also with transmetatarsal amputation.  Discussed issue regarding extensive tissue loss onto the dorsum of her foot which may make a closure somewhat difficult.  For surgery today.  This had been scheduled for Dr. Donzetta Matters later this afternoon.  I had a cancellation available this morning and the patient is anxious to proceed

## 2019-01-18 NOTE — Progress Notes (Signed)
PROGRESS NOTE  Amsi Grimley GYB:[id number] DOB: 03/27/1976 DOA: 01/16/2019 PCP: Rebekah Burton, [name]  HPI/Recap of past 71 hours: 43 year old female with medical history significant for peripheral artery disease end-stage renal disease on hemodialysis hypertension and blindness.  Subjective: Rebekah Burton seen at bedside just returned from surgery she had left popliteal femoropopliteal and left metatarsal amputation.  She is supposed to have the dialysis today but she decided she was not going to because she was feeling very tired  Assessment/Plan: Principal Problem:   Ischemic ulcer of left foot ([place]) Active Problems:   ESRD (end stage renal disease) ([place])   HTN (hypertension)   Blind in both eyes   PAD (peripheral artery disease) ([place])  1.  Ischemic ulcer of the left foot in the setting of peripheral artery disease status post left metatarsal amputation today 2.  End-stage renal disease Rebekah Burton did not want to go to hemodialysis today nephrology informed possible dialysis tomorrow, nephrologist made aware 3.  Hypertension, uncontrolled continue blood pressure medication 4.  Blindness chronic 5.  Anemia of chronic disease superimposed of possible blood loss secondary to surgery  Code Status: Full  Severity of Illness: The appropriate Rebekah Burton status for this Rebekah Burton is INPATIENT. Inpatient status is judged to be reasonable and necessary in order to provide the required intensity of service to ensure the Rebekah Burton's safety. The Rebekah Burton's presenting symptoms, physical exam findings, and initial radiographic and laboratory data in the context of their chronic comorbidities is felt to place them at high risk for further clinical deterioration. Furthermore, it is not anticipated that the Rebekah Burton will be medically stable for discharge from the hospital within 2 midnights of admission. The following factors support the Rebekah Burton status of inpatient.   " The Rebekah Burton's presenting symptoms include left  foot pain with ulcer failed outpatient antibiotic treatment. " The worrisome physical exam findings include left foot ulcer. " The initial radiographic and laboratory data are worrisome because of occlusion of SFA stenosis of common and profunda femoral. " The chronic co-morbidities include end-stage renal disease on hemodialysis.   * I certify that at the point of admission it is my clinical judgment that the Rebekah Burton will require inpatient hospital care spanning beyond 2 midnights from the point of admission due to high intensity of service, high risk for further deterioration and high frequency of surveillance required.*    Family Communication: None at bedside  Disposition Plan: Home when stable   Consultants:  Vascular Dr. Donnetta Hutching  Procedures:  Femoropopliteal bypass on the left and left metatarsal amputation  Antimicrobials:  Cefazolin  DVT prophylaxis: SCD   Objective: Vitals:   01/18/19 1451 01/18/19 1500 01/18/19 1506 01/18/19 1539  BP: (!) 167/92  (!) 154/90   Pulse:  82 83   Resp: 12 (!) 28 16   Temp: 97.9 F (36.6 C)   97.8 F (36.6 C)  TempSrc:      SpO2: 95% 95% 94%   Weight:      Height:        Intake/Output Summary (Last 24 hours) at 01/18/2019 1615 Last data filed at 01/18/2019 1422 Gross per 24 hour  Intake 603 ml  Output 50 ml  Net 553 ml   Filed Weights   01/16/19 2246 01/18/19 0500 01/18/19 0839  Weight: 89.9 kg 89.9 kg 89.9 kg   Body mass index is 34 kg/m.  Exam:  . General: 43 y.o. year-old female well developed well nourished in no acute distress.  Alert and oriented x3. . Blindness .  Cardiovascular: Regular rate and rhythm with no rubs or gallops.  No thyromegaly or JVD noted.   Marland Kitchen Respiratory: Clear to auscultation with no wheezes or rales. Good inspiratory effort. . Abdomen: Soft nontender nondistended with normal bowel sounds x4 quadrants. . Musculoskeletal: Previous metatarsal amputation of the right foot recent metatarsals  amputation of the left foot, mild edema bilaterally . Skin: No ulcerative lesions noted or rashes, . Psychiatry: Mood is appropriate for condition and setting    Data Reviewed: CBC: Recent Labs  Lab 01/16/19 2030 01/18/19 0324  WBC 8.3 7.6  NEUTROABS 6.2  --   HGB 9.1* 8.9*  HCT 29.1* 28.5*  MCV 99.0 98.3  PLT 285 093   Basic Metabolic Panel: Recent Labs  Lab 01/16/19 2030 01/17/19 0818 01/18/19 0324  NA 136 138 136  K 3.1* 3.6 3.8  CL 96* 98 100  CO2 25 27 24   GLUCOSE 90 94 94  BUN 14 19 35*  CREATININE 5.28* 6.93* 9.23*  CALCIUM 9.5 9.6 9.5  PHOS  --  4.0 5.2*   GFR: Estimated Creatinine Clearance: 8.8 mL/min (A) (by C-G formula based on SCr of 9.23 mg/dL (H)). Liver Function Tests: Recent Labs  Lab 01/17/19 0818 01/18/19 0324  ALBUMIN 3.5 3.1*   No results for input(s): LIPASE, AMYLASE in the last 168 hours. No results for input(s): AMMONIA in the last 168 hours. Coagulation Profile: No results for input(s): INR, PROTIME in the last 168 hours. Cardiac Enzymes: No results for input(s): CKTOTAL, CKMB, CKMBINDEX, TROPONINI in the last 168 hours. BNP (last 3 results) No results for input(s): PROBNP in the last 8760 hours. HbA1C: Recent Labs    01/16/19 2322  HGBA1C 5.2   CBG: No results for input(s): GLUCAP in the last 168 hours. Lipid Profile: No results for input(s): CHOL, HDL, LDLCALC, TRIG, CHOLHDL, LDLDIRECT in the last 72 hours. Thyroid Function Tests: No results for input(s): TSH, T4TOTAL, FREET4, T3FREE, THYROIDAB in the last 72 hours. Anemia Panel: No results for input(s): VITAMINB12, FOLATE, FERRITIN, TIBC, IRON, RETICCTPCT in the last 72 hours. Urine analysis: No results found for: COLORURINE, APPEARANCEUR, LABSPEC, PHURINE, GLUCOSEU, HGBUR, BILIRUBINUR, KETONESUR, PROTEINUR, UROBILINOGEN, NITRITE, LEUKOCYTESUR Sepsis Labs: @LABRCNTIP (procalcitonin:4,lacticidven:4)  ) Recent Results (from the past 240 hour(s))  Aerobic Culture  (superficial specimen)     Status: None (Preliminary result)   Collection Time: 01/15/19 12:30 PM  Result Value Ref Range Status   Specimen Description   Final    FOOT LEFT Performed at [facility] [address] [place], [place] [address]    Special Requests   Final    NONE Performed at [facility], [place] [address], [place], [place] [address]    Gram Stain   Final    RARE WBC PRESENT, PREDOMINANTLY MONONUCLEAR MODERATE GRAM POSITIVE COCCI ABUNDANT GRAM NEGATIVE RODS    Culture   Final    ABUNDANT GRAM NEGATIVE RODS ABUNDANT PSEUDOMONAS AERUGINOSA IDENTIFICATION AND SUSCEPTIBILITIES TO FOLLOW Performed at [facility], [place] [address], [place], [place] [address]    Report Status PENDING  Incomplete  Blood Cultures x 2 sites     Status: None (Preliminary result)   Collection Time: 01/16/19 11:15 PM  Result Value Ref Range Status   Specimen Description BLOOD RIGHT ANTECUBITAL  Final   Special Requests   Final    BOTTLES DRAWN AEROBIC ONLY Blood Culture results may not be optimal due to an inadequate volume of blood received in culture bottles   Culture  Final    NO GROWTH 1 DAY Performed at [facility], [place] [address], [place], [place] [address]    Report Status PENDING  Incomplete  Blood Cultures x 2 sites     Status: None (Preliminary result)   Collection Time: 01/16/19 11:23 PM  Result Value Ref Range Status   Specimen Description BLOOD RIGHT HAND  Final   Special Requests   Final    BOTTLES DRAWN AEROBIC AND ANAEROBIC Blood Culture adequate volume   Culture   Final    NO GROWTH 1 DAY Performed at [facility], [place] [address], [place], [place] [address]    Report Status PENDING  Incomplete  Surgical pcr screen     Status: Abnormal   Collection Time: 01/18/19  8:56 AM  Result Value Ref Range Status   MRSA, PCR POSITIVE (A) NEGATIVE Final    Comment: RESULT CALLED TO, READ BACK BY AND VERIFIED WITH: Brooke Pace RN 11:15 01/18/19 ([email or web address])    Staphylococcus aureus POSITIVE (A) NEGATIVE Final    Comment: (NOTE) The Xpert SA Assay (FDA approved for NASAL specimens in patients 62 years of age and older), is one component of a comprehensive surveillance program. It is not intended to diagnose infection nor to guide or monitor treatment. Performed at [facility], [place] [address] [place],  [address]       Studies: No results found.  Scheduled Meds: . amLODipine  10 mg Oral Daily  . carvedilol  25 mg Oral BID WC  . cloNIDine  0.2 mg Oral TID  . [START ON 01/19/2019] docusate sodium  100 mg Oral Daily  . feeding supplement (PRO-STAT SUGAR FREE 64)  30 mL Oral BID  . heparin  5,000 Units Subcutaneous Q8H  . HYDROmorphone      . HYDROmorphone      . multivitamin  1 tablet Oral QHS  . pantoprazole  40 mg Oral Daily  . povidone-iodine   Topical BID  . sevelamer carbonate  3,200 mg Oral TID WC  . sodium chloride flush  3 mL Intravenous Q12H    Continuous Infusions: . sodium chloride    . sodium chloride    . sodium chloride    .  ceFAZolin (ANCEF) IV    . magnesium sulfate 1 - 4 g bolus IVPB       LOS: 2 days     Cristal Deer, MD [facility]  To reach me or the doctor on call, go to: [email or web address] Password [facility]  01/18/2019, 4:15 PM

## 2019-01-18 NOTE — Anesthesia Preprocedure Evaluation (Signed)
Anesthesia Evaluation  Patient identified by MRN, date of birth, ID band Patient awake    Reviewed: Allergy & Precautions, NPO status , Patient's Chart, lab work & pertinent test results, reviewed documented beta blocker date and time   Airway Mallampati: II  TM Distance: >3 FB Neck ROM: Full    Dental  (+) Dental Advisory Given   Pulmonary Current Smoker,    breath sounds clear to auscultation       Cardiovascular hypertension, Pt. on medications and Pt. on home beta blockers + Peripheral Vascular Disease   Rhythm:Regular Rate:Normal     Neuro/Psych negative neurological ROS     GI/Hepatic negative GI ROS, Neg liver ROS,   Endo/Other  negative endocrine ROS  Renal/GU ESRF and DialysisRenal disease     Musculoskeletal   Abdominal   Peds  Hematology  (+) anemia ,   Anesthesia Other Findings   Reproductive/Obstetrics                             Lab Results  Component Value Date   WBC 7.6 01/18/2019   HGB 8.9 (L) 01/18/2019   HCT 28.5 (L) 01/18/2019   MCV 98.3 01/18/2019   PLT 272 01/18/2019   Lab Results  Component Value Date   CREATININE 9.23 (H) 01/18/2019   BUN 35 (H) 01/18/2019   NA 136 01/18/2019   K 3.8 01/18/2019   CL 100 01/18/2019   CO2 24 01/18/2019    Anesthesia Physical Anesthesia Plan  ASA: III  Anesthesia Plan: General   Post-op Pain Management:    Induction: Intravenous  PONV Risk Score and Plan: 2 and Dexamethasone, Ondansetron and Treatment may vary due to age or medical condition  Airway Management Planned: Oral ETT  Additional Equipment:   Intra-op Plan:   Post-operative Plan: Extubation in OR  Informed Consent: I have reviewed the patients History and Physical, chart, labs and discussed the procedure including the risks, benefits and alternatives for the proposed anesthesia with the patient or authorized representative who has indicated his/her  understanding and acceptance.     Dental advisory given  Plan Discussed with: CRNA  Anesthesia Plan Comments:         Anesthesia Quick Evaluation

## 2019-01-18 NOTE — Op Note (Signed)
OPERATIVE REPORT  DATE OF SURGERY: 01/18/2019  PATIENT: Rebekah Burton, 43 y.o. female MRN: [id number]  DOB: 02-26-1976  PRE-OPERATIVE DIAGNOSIS: Gangrene left foot with severe ischemia  POST-OPERATIVE DIAGNOSIS:  Same  PROCEDURE: #1 left common femoral and profunda endarterectomy and vein patch angioplasty, #2 left femoral to below-knee popliteal bypass with reverse great saphenous vein.  #3 left transmetatarsal amputation  SURGEON:  Curt Jews, M.D.  PHYSICIAN ASSISTANT: Dr. Monica Martinez  ANESTHESIA: General  EBL: per anesthesia record  Total I/O In: 600 [I.V.:600] Out: 62 [Blood:50]  BLOOD ADMINISTERED: none  DRAINS: none  SPECIMEN: none  COUNTS CORRECT:  YES  PATIENT DISPOSITION:  PACU - hemodynamically stable  PROCEDURE DETAILS: Patient was taken to the operating placed supine position where the area of the left groin and left leg were prepped and draped in usual sterile fashion.  SonoSite ultrasound was used to visualize the saphenous vein which was of excellent caliber from the mid calf to the groin.  Incision was made over the femoral artery and carried down to isolate the common superficial femoral and profundus femoris arteries.  The patient had extensive calcification in the artery with occlusion at the origin of the superficial femoral artery.  The artery was dissected up under the inguinal ligament and the external iliac artery had one area that was felt to be soft and for occlusion.  The deep femoral artery was exposed down to several centimeters for multiple branch points and these were controlled with red Vesseloops.  Saphenous vein was identified at the saphenofemoral junction and the saphenous vein was harvested from the groin to the mid to distal calf.  Multiple skin bridges were left in the thigh and calf.  Tributary branches were ligated with 3-0 and 4-0 silk ties and divided.  The was ligated at the saphenofemoral junction and divided and was ligated  distally and divided.  The vein was gently dilated and was of excellent caliber.  The below-knee popliteal artery was exposed to the vein harvest incision.  The artery was small and did have extensive calcification but was patent.  A tunnel was created from the below-knee popliteal artery to the groin.  The patient was given 9000 intravenous heparin.  After extrication time the external iliac artery was occluded with a Henley clamp.  The profunda branches were occluded with red Vesseloops.  The superficial artery was transected at its origin.  The common femoral artery was opened and extended to the external iliac with Potts scissors.  The profundus femoris artery was opened longitudinally through the same arteriotomy.  There was extensive calcification.  The profundus femoris artery was endarterectomized extending down onto these branches.  The distal endpoint was tacked with 6-0 Prolene suture.  The common femoral and distal external iliac arteries were endarterectomized.  A segment of saphenous vein was brought onto the field and was opened longitudinally and was sewn as a patch angioplasty with a running 6-0 Prolene suture.  This anastomosis was tested and found to be adequate.  Next the saphenous vein was brought onto the field.  It was placed in reverse position.  The vein was marked to reduce risk for twisting.  The vein patch was opened longitudinally with 11 blade and Potts scissors.  The saphenous vein was spatulated and sewn end-to-side to the vein patch with a running 6-0 Prolene suture.  This anastomosis was tested and found to be adequate.  The vein was then brought through the prior created anatomic tunnel down to the  level of the below-knee popliteal artery.  The below-knee popliteal artery was occluded proximally distally and was opened with 11 blade sent longitudinally with Potts scissors.  The vein was cut to the appropriate length and spatulated and sewn end-to-side to the below-knee popliteal  artery with a running 6-0 Prolene suture.  The usual flushing maneuvers were undertaken anastomosis completed and clamps removed with excellent flow through the vein graft and in the popliteal artery.  The patient was given 50 mg of protamine reverse heparin.  The vein harvest incisions were closed with 3-0 Vicryl in the subcutaneous and subcuticular tissue.  The groin and popliteal incisions were closed with 2-0 Vicryl and the fascia and 3-0 subcuticular Vicryl sutures.  Sterile dressing was applied  Attention was then turned to the foot.  The patient had extensive gangrene on the dorsum of the foot at the base of the second third fourth and fifth toes.  An incision was made anteriorly over all the metatarsal bones just proximal to the area of gangrene.  A larger posterior flap was left on the base of the foot.  There was excellent immediate bleeding.  The bones were divided with bone shears and were debrided further proximally with rongeur.  The specimen was passed off the field.  The amputation was irrigated with saline and hemostasis electrocautery.  The wound was closed by first approximating the anterior fascia to the posterior fascia with interrupted 2-0 Vicryl sutures.  The posterior flap was closed up over the anterior flap with 2-0 nylon sutures.  The skin itself was closed with skin staples.  Sterile dressing was applied the patient was transferred to the recovery room in stable condition  Rosetta Posner, M.D., [facility] 01/18/2019 2:58 PM

## 2019-01-18 NOTE — Progress Notes (Signed)
CSW acknowledges consult for accessing medication for discharge. For further assistance with this matter please consult RNCM.   At this time no CSW needs warranted. CSW will sign off.     Virgie Dad. Kiahna Banghart, MSW, [facility] Clinical Social Worker [phone]

## 2019-01-18 NOTE — Transfer of Care (Signed)
Immediate Anesthesia Transfer of Care Note  Patient: Rebekah Burton  Procedure(s) Performed: ENDARTERECTOMY FEMORAL with Perfundaplasty (Left Groin) Left  FEMORAL- to  Below the Knee POPLITEAL ARTERY bypass using reversed safenous vein. (Left Leg Lower) TRANSMETATARSAL AMPUTATION (Left Foot)  Patient Location: PACU  Anesthesia Type:General  Level of Consciousness: awake and alert   Airway & Oxygen Therapy: Patient Spontanous Breathing and Patient connected to face mask oxygen  Post-op Assessment: Report given to RN and Post -op Vital signs reviewed and stable  Post vital signs: Reviewed and stable  Last Vitals:  Vitals Value Taken Time  BP 152/104 01/18/2019  2:20 PM  Temp    Pulse 87 01/18/2019  2:22 PM  Resp 12 01/18/2019  2:22 PM  SpO2 100 % 01/18/2019  2:22 PM  Vitals shown include unvalidated device data.  Last Pain:  Vitals:   01/18/19 0751  TempSrc: Oral  PainSc:          Complications: No apparent anesthesia complications

## 2019-01-18 NOTE — Anesthesia Procedure Notes (Signed)
Procedure Name: Intubation Date/Time: 01/18/2019 9:20 AM Performed by: Genelle Bal, CRNA Pre-anesthesia Checklist: Patient identified, Emergency Drugs available, Suction available and Patient being monitored Patient Re-evaluated:Patient Re-evaluated prior to induction Oxygen Delivery Method: Circle system utilized Preoxygenation: Pre-oxygenation with 100% oxygen Induction Type: IV induction Ventilation: Mask ventilation without difficulty and Oral airway inserted - appropriate to patient size Laryngoscope Size: Mac and 3 Grade View: Grade I Tube type: Oral Tube size: 7.0 mm Number of attempts: 1 Airway Equipment and Method: Stylet and Oral airway Placement Confirmation: ETT inserted through vocal cords under direct vision,  positive ETCO2 and breath sounds checked- equal and bilateral Secured at: 22 cm Tube secured with: Tape Dental Injury: Teeth and Oropharynx as per pre-operative assessment

## 2019-01-18 NOTE — Progress Notes (Signed)
Orthopedic Tech Progress Note Patient Details:  Rebekah Burton 11/09/1976 [id number]  Ortho Devices Type of Ortho Device: Darco shoe Ortho Device/Splint Interventions: Ordered, Adjustment, Application   Post Interventions Patient Tolerated: Well Instructions Provided: Adjustment of device, Care of device   Rebekah Burton 01/18/2019, 6:08 PM

## 2019-01-19 ENCOUNTER — Encounter (HOSPITAL_COMMUNITY): Payer: Medicare Other

## 2019-01-19 LAB — RENAL FUNCTION PANEL
Albumin: 2.8 g/dL — ABNORMAL LOW (ref 3.5–5.0)
Anion gap: 15 (ref 5–15)
BUN: 56 mg/dL — ABNORMAL HIGH (ref 6–20)
CO2: 23 mmol/L (ref 22–32)
Calcium: 9.1 mg/dL (ref 8.9–10.3)
Chloride: 93 mmol/L — ABNORMAL LOW (ref 98–111)
Creatinine, Ser: 11.79 mg/dL — ABNORMAL HIGH (ref 0.44–1.00)
GFR calc Af Amer: 4 mL/min — ABNORMAL LOW (ref >60)
GFR calc non Af Amer: 4 mL/min — ABNORMAL LOW (ref >60)
Glucose, Bld: 138 mg/dL — ABNORMAL HIGH (ref 70–99)
Phosphorus: 5.7 mg/dL — ABNORMAL HIGH (ref 2.5–4.6)
Potassium: 3.9 mmol/L (ref 3.5–5.1)
Sodium: 131 mmol/L — ABNORMAL LOW (ref 135–145)

## 2019-01-19 LAB — CBC
HCT: 22.8 % — ABNORMAL LOW (ref 36.0–46.0)
HCT: 23.9 % — ABNORMAL LOW (ref 36.0–46.0)
Hemoglobin: 7 g/dL — ABNORMAL LOW (ref 12.0–15.0)
Hemoglobin: 7.8 g/dL — ABNORMAL LOW (ref 12.0–15.0)
MCH: 30.2 pg (ref 26.0–34.0)
MCH: 32 pg (ref 26.0–34.0)
MCHC: 30.7 g/dL (ref 30.0–36.0)
MCHC: 32.6 g/dL (ref 30.0–36.0)
MCV: 98 fL (ref 80.0–100.0)
MCV: 98.3 fL (ref 80.0–100.0)
Platelets: 297 10*3/uL (ref 150–400)
Platelets: 301 10*3/uL (ref 150–400)
RBC: 2.32 MIL/uL — ABNORMAL LOW (ref 3.87–5.11)
RBC: 2.44 MIL/uL — ABNORMAL LOW (ref 3.87–5.11)
RDW: 14.9 % (ref 11.5–15.5)
RDW: 14.9 % (ref 11.5–15.5)
WBC: 12.7 10*3/uL — ABNORMAL HIGH (ref 4.0–10.5)
WBC: 15 10*3/uL — ABNORMAL HIGH (ref 4.0–10.5)
nRBC: 0 % (ref 0.0–0.2)
nRBC: 0 % (ref 0.0–0.2)

## 2019-01-19 LAB — AEROBIC CULTURE W GRAM STAIN (SUPERFICIAL SPECIMEN)

## 2019-01-19 LAB — BASIC METABOLIC PANEL
Anion gap: 17 — ABNORMAL HIGH (ref 5–15)
BUN: 49 mg/dL — ABNORMAL HIGH (ref 6–20)
CO2: 23 mmol/L (ref 22–32)
Calcium: 9.1 mg/dL (ref 8.9–10.3)
Chloride: 93 mmol/L — ABNORMAL LOW (ref 98–111)
Creatinine, Ser: 10.98 mg/dL — ABNORMAL HIGH (ref 0.44–1.00)
GFR calc Af Amer: 5 mL/min — ABNORMAL LOW (ref >60)
GFR calc non Af Amer: 4 mL/min — ABNORMAL LOW (ref >60)
Glucose, Bld: 132 mg/dL — ABNORMAL HIGH (ref 70–99)
Potassium: 4.5 mmol/L (ref 3.5–5.1)
Sodium: 133 mmol/L — ABNORMAL LOW (ref 135–145)

## 2019-01-19 LAB — AEROBIC CULTURE  (SUPERFICIAL SPECIMEN)

## 2019-01-19 MED ORDER — PENTAFLUOROPROP-TETRAFLUOROETH EX AERO: 1.0000 "application " | INHALATION_SPRAY | CUTANEOUS | Status: DC | PRN

## 2019-01-19 MED ORDER — MUPIROCIN 2 % EX OINT
1.0000 "application " | TOPICAL_OINTMENT | Freq: Two times a day (BID) | CUTANEOUS | Status: AC
  Administered 2019-01-19 – 2019-01-23 (×10): 1 via NASAL

## 2019-01-19 MED ORDER — LIDOCAINE HCL (PF) 1 % IJ SOLN: 5.0000 mL | INTRAMUSCULAR | Status: DC | PRN

## 2019-01-19 MED ORDER — CHLORHEXIDINE GLUCONATE CLOTH 2 % EX PADS
6.0000 | MEDICATED_PAD | Freq: Every day | CUTANEOUS | Status: DC
  Administered 2019-01-19 – 2019-01-26 (×7): 6 via TOPICAL

## 2019-01-19 MED ORDER — SODIUM CHLORIDE 0.9 % IV SOLN: 100.0000 mL | INTRAVENOUS | Status: DC | PRN

## 2019-01-19 MED ORDER — ALTEPLASE 2 MG IJ SOLR: 2.0000 mg | Freq: Once | INTRAMUSCULAR | Status: DC | PRN

## 2019-01-19 MED ORDER — HEPARIN SODIUM (PORCINE) 1000 UNIT/ML DIALYSIS: 1000.0000 [IU] | INTRAMUSCULAR | Status: DC | PRN

## 2019-01-19 MED ORDER — LIDOCAINE-PRILOCAINE 2.5-2.5 % EX CREA: 1.0000 "application " | TOPICAL_CREAM | CUTANEOUS | Status: DC | PRN

## 2019-01-19 MED ORDER — ASPIRIN EC 81 MG PO TBEC
81.0000 mg | DELAYED_RELEASE_TABLET | Freq: Every day | ORAL | Status: DC
  Administered 2019-01-19 – 2019-01-26 (×8): 81 mg via ORAL
  Filled 2019-01-19 (×8): qty 1

## 2019-01-19 MED ORDER — HEPARIN SODIUM (PORCINE) 1000 UNIT/ML DIALYSIS: 40.0000 [IU]/kg | INTRAMUSCULAR | Status: DC | PRN

## 2019-01-19 MED ORDER — HYDROMORPHONE HCL 1 MG/ML IJ SOLN
INTRAMUSCULAR | Status: AC
  Administered 2019-01-19: 1 mg via INTRAVENOUS
  Filled 2019-01-19: qty 1

## 2019-01-19 NOTE — Progress Notes (Signed)
PT Cancellation Note  Patient Details Name: Rebekah Burton MRN: [id number] DOB: 02/06/1976   Cancelled Treatment:    Reason Eval/Treat Not Completed: Patient at procedure or test/unavailable. Pt in HD. PT to re-attempt eval as time allows.   Lorriane Shire 01/19/2019, 9:34 AM   Lorrin Goodell, PT  Office # [phone] Pager [phone]

## 2019-01-19 NOTE — Consult Note (Signed)
Reason for Consult: Continuity of ESRD care Referring Physician: Cristal Deer M.D. [facility])  HPI:  43 year old African-American woman with past medical history significant for hypertension, peripheral arterial disease, visual impairment and end-stage renal disease on hemodialysis on a Monday/Wednesday/Friday schedule.  She was admitted to the hospital with increasing left foot/wound pain from arterial insufficiency and gangrene and yesterday underwent left common femoral to below-knee popliteal bypass with ipsilateral transmetatarsal amputation.  She elected to postpone dialysis until today due to pain/discomfort.  When seen today, she complains of appropriate postoperative pain without any shortness of breath or chest pain.  She denies any nausea, vomiting or diarrhea and has not had any fevers or chills.   Hemodialysis prescription: Monday/Wednesday/Friday, [facility], 4 hours 15 minutes, EDW 90 kg, BFR 400, DFR 800, 2K/2.5 calcium, no UF profile/sodium modeling.  Mircera 100 mcg every 2 weeks (last dose 1/22), Hectorol 5 mcg q. dialysis and Parsabiv 7.5 mg q. dialysis.  Left BCF.  Past Medical History:  Diagnosis Date  . Blind   . ESRD (end stage renal disease) ([place])   . Foot ulceration ([place]) 12/2018  . Hypertension   . PAD (peripheral artery disease) ([place])     Past Surgical History:  Procedure Laterality Date  . ABDOMINAL AORTOGRAM W/LOWER EXTREMITY Bilateral 01/17/2019   Procedure: ABDOMINAL AORTOGRAM W/LOWER EXTREMITY;  Surgeon: Waynetta Sandy, MD;  Location: [place] [facility];  Service: Cardiovascular;  Laterality: Bilateral;  . ABDOMINAL HYSTERECTOMY    . NO PAST SURGERIES    . toe removal Right    All toes on right foot have been removed.     Family History  Problem Relation Age of Onset  . Peripheral Artery Disease Neg Hx   . Kidney failure Neg Hx     Social History:  reports that she has been smoking cigarettes. She has a 10.00  pack-year smoking history. She has never used smokeless tobacco. She reports that she does not drink alcohol or use drugs.  Allergies:  Allergies  Allergen Reactions  . Percocet [Oxycodone-Acetaminophen] Hives    Medications:  Scheduled: . amLODipine  10 mg Oral Daily  . aspirin EC  81 mg Oral Daily  . carvedilol  25 mg Oral BID WC  . Chlorhexidine Gluconate Cloth  6 each Topical Q0600  . cloNIDine  0.2 mg Oral TID  . docusate sodium  100 mg Oral Daily  . feeding supplement (PRO-STAT SUGAR FREE 64)  30 mL Oral BID  . heparin  5,000 Units Subcutaneous Q8H  . multivitamin  1 tablet Oral QHS  . mupirocin ointment  1 application Nasal BID  . pantoprazole  40 mg Oral Daily  . povidone-iodine   Topical BID  . sevelamer carbonate  3,200 mg Oral TID WC  . sodium chloride flush  3 mL Intravenous Q12H    BMP Latest Ref Rng & Units 01/19/2019 01/18/2019 01/18/2019  Glucose 70 - 99 mg/dL 138(H) 132(H) 94  BUN 6 - 20 mg/dL 56(H) 49(H) 35(H)  Creatinine 0.44 - 1.00 mg/dL 11.79(H) 10.98(H) 9.23(H)  Sodium 135 - 145 mmol/L 131(L) 133(L) 136  Potassium 3.5 - 5.1 mmol/L 3.9 4.5 3.8  Chloride 98 - 111 mmol/L 93(L) 93(L) 100  CO2 22 - 32 mmol/L 23 23 24   Calcium 8.9 - 10.3 mg/dL 9.1 9.1 9.5   CBC Latest Ref Rng & Units 01/19/2019 01/18/2019 01/18/2019  WBC 4.0 - 10.5 K/uL 12.7(H) 15.0(H) 7.6  Hemoglobin 12.0 - 15.0 g/dL 7.0(L) 7.8(L) 8.9(L)  Hematocrit  36.0 - 46.0 % 22.8(L) 23.9(L) 28.5(L)  Platelets 150 - 400 K/uL 297 301 61  Was aroused with movement from 5 W. Review of Systems  Constitutional: Positive for malaise/fatigue. Negative for chills and fever.  HENT: Negative.   Eyes:       Visual impairment-chronic  Respiratory: Negative.   Cardiovascular: Negative.   Gastrointestinal: Negative.   Genitourinary: Negative.   Musculoskeletal:       Left leg surgical site pain  Skin: Negative.   Neurological: Negative for headaches.   Blood pressure 100/72, pulse 79, temperature 98.3 F  (36.8 C), temperature source Oral, resp. rate 19, height 5\' 4"  (1.626 m), weight 92 kg, SpO2 100 %. Physical Exam  Nursing note and vitals reviewed. Constitutional: She is oriented to person, place, and time. She appears well-developed and well-nourished. No distress.  HENT:  Head: Normocephalic and atraumatic.  Mouth/Throat: Oropharynx is clear and moist.  Neck: Normal range of motion. Neck supple. No JVD present.  Cardiovascular: Normal rate, regular rhythm and normal heart sounds.  No murmur heard. Respiratory: Effort normal and breath sounds normal. She has no wheezes. She has no rales.  GI: Soft. Bowel sounds are normal. There is no abdominal tenderness. There is no rebound and no guarding.  Musculoskeletal:        General: No edema.     Comments: Left foot with dry dressing and scars from recent femoropopliteal bypass, right leg status post TMA.  Left BCF cannulated  Neurological: She is alert and oriented to person, place, and time.  Skin: Skin is warm and dry.    Assessment/Plan: 1.  Left leg ischemic ulcer with gangrenous toes: Status post femoral-popliteal bypass yesterday with transmetatarsal amputation of gangrenous toes.  Appears to be doing well from a postoperative standpoint. 2.  End-stage renal disease: On hemodialysis today after postponing dialysis yesterday.  We will continue usual Monday/Wednesday/Friday schedule starting next week.  Volume status acceptable and will lower her back to her dry weight. 3.  Anemia: Secondary to end-stage renal disease and likely aggravated by recent surgical losses.  Status post recent ESA.  Monitor, without indications for transfusion. 4.  Secondary hyperparathyroidism: Restart phosphorus binders along with vitamin D receptor analog for PTH suppression.  She is usually on [place] which is unavailable here. 5.  Hypertension: Continue to monitor blood pressures with pain management and ongoing antihypertensive  therapy/hemodialysis.  Riata Ikeda K. 01/19/2019, 11:45 AM

## 2019-01-19 NOTE — Progress Notes (Signed)
Patient educated on MRSA precautions and the use of mupirocin ointment in the nares. All questions answered. Will continue to monitor. Lajoyce Corners, RN

## 2019-01-19 NOTE — Progress Notes (Signed)
Patient placed on MRSA precautions as verified by positive results for MRSA by PCR and staph aureus. According to results review, results called to OR on 01/18/19. Night RN not aware of this result until viewing results review during shift. Order set now placed. CHG bath will be given this morning and mupirocin ointment administration scheduled. Will continue to monitor. Lajoyce Corners, RN

## 2019-01-19 NOTE — Progress Notes (Signed)
  Progress Note    01/19/2019 9:30 AM 1 Day Post-Op  Subjective: Currently on dialysis having minimal pain  Vitals:   01/19/19 0831 01/19/19 0900  BP: 121/72 130/77  Pulse: 69 85  Resp: 18 18  Temp:    SpO2:      Physical Exam: Awake alert oriented Nonlabored respirations Currently on dialysis via left upper arm access Incisions in left leg clean dry and intact with Dermabond TMA dressing clean dry intact  CBC    Component Value Date/Time   WBC 15.0 (H) 01/18/2019 2352   RBC 2.44 (L) 01/18/2019 2352   HGB 7.8 (L) 01/18/2019 2352   HCT 23.9 (L) 01/18/2019 2352   PLT 301 01/18/2019 2352   MCV 98.0 01/18/2019 2352   MCH 32.0 01/18/2019 2352   MCHC 32.6 01/18/2019 2352   RDW 14.9 01/18/2019 2352   LYMPHSABS 1.1 01/16/2019 2030   MONOABS 0.7 01/16/2019 2030   EOSABS 0.3 01/16/2019 2030   BASOSABS 0.0 01/16/2019 2030    BMET    Component Value Date/Time   NA 133 (L) 01/18/2019 2352   K 4.5 01/18/2019 2352   CL 93 (L) 01/18/2019 2352   CO2 23 01/18/2019 2352   GLUCOSE 132 (H) 01/18/2019 2352   BUN 49 (H) 01/18/2019 2352   CREATININE 10.98 (H) 01/18/2019 2352   CALCIUM 9.1 01/18/2019 2352   GFRNONAA 4 (L) 01/18/2019 2352   GFRAA 5 (L) 01/18/2019 2352    INR No results found for: INR   Intake/Output Summary (Last 24 hours) at 01/19/2019 0930 Last data filed at 01/19/2019 0400 Gross per 24 hour  Intake 1543 ml  Output 50 ml  Net 1493 ml     Assessment:  43 y.o. female is s/p left femoral to popliteal artery bypass with vein and transmetatarsal amputation on the left for gangrenous toes  Plan: She will need physical therapy ordered aspirin for antiplatelet coverage Transmetatarsal amputation dressing down tomorrow   Erlene Quan C. Donzetta Matters, MD [facility] Office: [phone] Pager: [phone]  01/19/2019 9:30 AM

## 2019-01-19 NOTE — Progress Notes (Signed)
OT Cancellation    01/19/19 0900  OT Visit Information  Last OT Received On 01/19/19  Reason Eval/Treat Not Completed Patient at procedure or test/ unavailable (HD)  M[place]ie Boettcher, OT/L   Acute OT Clinical Specialist [place] Pager [phone] Office [phone]

## 2019-01-19 NOTE — Progress Notes (Signed)
PROGRESS NOTE  Rebekah Burton YPP:[id number] DOB: February 21, 1976 DOA: 01/16/2019 PCP: Patient, [name]  HPI/Recap of past 68 hours: 43 year old female with medical history significant for peripheral artery disease end-stage renal disease on hemodialysis hypertension and blindness.  Subjective: Patient seen at bedside just returned from surgery she had left popliteal femoropopliteal and left metatarsal amputation.  She is supposed to have the dialysis today but she decided she was not going to because she was feeling very tired  Subjective: January 19, 2019 Patient seen and examined at bedside.  Doing better.  The left femoropopliteal incisions intact no evidence of infection.  Patient had hemodialysis today.  Denies any fever chills but the expected pain postsurgical  Assessment/Plan: Principal Problem:   Ischemic ulcer of left foot ([place]) Active Problems:   ESRD (end stage renal disease) ([place])   HTN (hypertension)   Blind in both eyes   PAD (peripheral artery disease) ([place])  1.  Ischemic ulcer of the left foot in the setting of peripheral artery disease status post left metatarsal amputation today  2.  End-stage renal disease patient did not want to go to hemodialysis today nephrology informed possible dialysis tomorrow, nephrologist made aware  3.  Hypertension, uncontrolled continue blood pressure medication  4.  Blindness chronic  5.  Anemia of chronic disease superimposed of possible blood loss secondary to surgery continue to monitor  Code Status: Full  Severity of Illness: The appropriate patient status for this patient is INPATIENT. Inpatient status is judged to be reasonable and necessary in order to provide the required intensity of service to ensure the patient's safety. The patient's presenting symptoms, physical exam findings, and initial radiographic and laboratory data in the context of their chronic comorbidities is felt to place them at high risk for further clinical  deterioration. Furthermore, it is not anticipated that the patient will be medically stable for discharge from the hospital within 2 midnights of admission. The following factors support the patient status of inpatient.   " The patient's presenting symptoms include left foot pain with ulcer failed outpatient antibiotic treatment. " The worrisome physical exam findings include left foot ulcer. " The initial radiographic and laboratory data are worrisome because of occlusion of SFA stenosis of common and profunda femoral. " The chronic co-morbidities include end-stage renal disease on hemodialysis.   * I certify that at the point of admission it is my clinical judgment that the patient will require inpatient hospital care spanning beyond 2 midnights from the point of admission due to high intensity of service, high risk for further deterioration and high frequency of surveillance required.*    Family Communication: None at bedside  Disposition Plan: Home when stable possible discharge tomorrow   Consultants:  Vascular, Dr. Donnetta Hutching  Procedures:  Femoropopliteal bypass on the left and left metatarsal amputation  Hemodialysis  Antimicrobials:  Cefazolin  DVT prophylaxis: SCD   Objective: Vitals:   01/19/19 1200 01/19/19 1230 01/19/19 1236 01/19/19 1322  BP: (!) 103/37 (!) 159/94 131/82 115/88  Pulse: 82 90 89 96  Resp:   19 18  Temp:   97.9 F (36.6 C) 97.9 F (36.6 C)  TempSrc:   Oral Oral  SpO2:   100% 99%  Weight:   88.5 kg   Height:        Intake/Output Summary (Last 24 hours) at 01/19/2019 1423 Last data filed at 01/19/2019 1300 Gross per 24 hour  Intake 1243 ml  Output 2500 ml  Net -1257 ml  Filed Weights   01/19/19 0320 01/19/19 0820 01/19/19 1236  Weight: 89.9 kg 92 kg 88.5 kg   Body mass index is 33.49 kg/m.  Exam:  . General: 43 y.o. year-old female well developed well nourished in no acute distress.  Alert and oriented  x3. . Blindness . Cardiovascular: Regular rate and rhythm with no rubs or gallops.  No thyromegaly or JVD noted.   Marland Kitchen Respiratory: Clear to auscultation with no wheezes or rales. Good inspiratory effort. . Abdomen: Soft nontender nondistended with normal bowel sounds x4 quadrants. . Musculoskeletal: Previous metatarsal amputation of the right foot recent metatarsals amputation of the left foot, mild edema bilaterally Skin:The left femoropopliteal incisions intact no evidence of infection  .  No ulcerative lesions noted or rashes, . Psychiatry: Mood is appropriate for condition and setting    Data Reviewed: CBC: Recent Labs  Lab 01/16/19 2030 01/18/19 0324 01/18/19 2352 01/19/19 1002  WBC 8.3 7.6 15.0* 12.7*  NEUTROABS 6.2  --   --   --   HGB 9.1* 8.9* 7.8* 7.0*  HCT 29.1* 28.5* 23.9* 22.8*  MCV 99.0 98.3 98.0 98.3  PLT 285 272 301 629   Basic Metabolic Panel: Recent Labs  Lab 01/16/19 2030 01/17/19 0818 01/18/19 0324 01/18/19 2352 01/19/19 1003  NA 136 138 136 133* 131*  K 3.1* 3.6 3.8 4.5 3.9  CL 96* 98 100 93* 93*  CO2 25 27 24 23 23   GLUCOSE 90 94 94 132* 138*  BUN 14 19 35* 49* 56*  CREATININE 5.28* 6.93* 9.23* 10.98* 11.79*  CALCIUM 9.5 9.6 9.5 9.1 9.1  PHOS  --  4.0 5.2*  --  5.7*   GFR: Estimated Creatinine Clearance: 6.8 mL/min (A) (by C-G formula based on SCr of 11.79 mg/dL (H)). Liver Function Tests: Recent Labs  Lab 01/17/19 0818 01/18/19 0324 01/19/19 1003  ALBUMIN 3.5 3.1* 2.8*   No results for input(s): LIPASE, AMYLASE in the last 168 hours. No results for input(s): AMMONIA in the last 168 hours. Coagulation Profile: No results for input(s): INR, PROTIME in the last 168 hours. Cardiac Enzymes: No results for input(s): CKTOTAL, CKMB, CKMBINDEX, TROPONINI in the last 168 hours. BNP (last 3 results) No results for input(s): PROBNP in the last 8760 hours. HbA1C: Recent Labs    01/16/19 2322  HGBA1C 5.2   CBG: No results for input(s):  GLUCAP in the last 168 hours. Lipid Profile: No results for input(s): CHOL, HDL, LDLCALC, TRIG, CHOLHDL, LDLDIRECT in the last 72 hours. Thyroid Function Tests: No results for input(s): TSH, T4TOTAL, FREET4, T3FREE, THYROIDAB in the last 72 hours. Anemia Panel: No results for input(s): VITAMINB12, FOLATE, FERRITIN, TIBC, IRON, RETICCTPCT in the last 72 hours. Urine analysis: No results found for: COLORURINE, APPEARANCEUR, LABSPEC, PHURINE, GLUCOSEU, HGBUR, BILIRUBINUR, KETONESUR, PROTEINUR, UROBILINOGEN, NITRITE, LEUKOCYTESUR Sepsis Labs: @LABRCNTIP (procalcitonin:4,lacticidven:4)  ) Recent Results (from the past 240 hour(s))  Aerobic Culture (superficial specimen)     Status: None   Collection Time: 01/15/19 12:30 PM  Result Value Ref Range Status   Specimen Description   Final    FOOT LEFT Performed at [place] [address] [place], [place] [address]    Special Requests   Final    NONE Performed at [facility], [place] [address], [place], [place] [address]    Gram Stain   Final    RARE WBC PRESENT, PREDOMINANTLY MONONUCLEAR MODERATE GRAM POSITIVE COCCI ABUNDANT GRAM NEGATIVE RODS Performed at [facility], [place]  [address], [place], [place] [address]    Culture   Final    ABUNDANT ESCHERICHIA COLI ABUNDANT PSEUDOMONAS AERUGINOSA    Report Status 01/19/2019 FINAL  Final   Organism ID, Bacteria ESCHERICHIA COLI  Final   Organism ID, Bacteria PSEUDOMONAS AERUGINOSA  Final      Susceptibility   Escherichia coli - MIC*    AMPICILLIN >=32 RESISTANT Resistant     CEFAZOLIN 16 SENSITIVE Sensitive     CEFEPIME <=1 SENSITIVE Sensitive     CEFTAZIDIME <=1 SENSITIVE Sensitive     CEFTRIAXONE <=1 SENSITIVE Sensitive     CIPROFLOXACIN <=0.25 SENSITIVE Sensitive     GENTAMICIN <=1 SENSITIVE Sensitive     IMIPENEM <=0.25 SENSITIVE Sensitive     TRIMETH/SULFA <=20 SENSITIVE Sensitive     AMPICILLIN/SULBACTAM >=32 RESISTANT Resistant      PIP/TAZO 8 SENSITIVE Sensitive     Extended ESBL NEGATIVE Sensitive     * ABUNDANT ESCHERICHIA COLI   Pseudomonas aeruginosa - MIC*    CEFTAZIDIME 4 SENSITIVE Sensitive     CIPROFLOXACIN <=0.25 SENSITIVE Sensitive     GENTAMICIN <=1 SENSITIVE Sensitive     IMIPENEM 2 SENSITIVE Sensitive     PIP/TAZO 16 SENSITIVE Sensitive     CEFEPIME 4 SENSITIVE Sensitive     * ABUNDANT PSEUDOMONAS AERUGINOSA  Blood Cultures x 2 sites     Status: None (Preliminary result)   Collection Time: 01/16/19 11:15 PM  Result Value Ref Range Status   Specimen Description BLOOD RIGHT ANTECUBITAL  Final   Special Requests   Final    BOTTLES DRAWN AEROBIC ONLY Blood Culture results may not be optimal due to an inadequate volume of blood received in culture bottles   Culture   Final    NO GROWTH 2 DAYS Performed at [facility], [place] [address], [place], [place] [address]    Report Status PENDING  Incomplete  Blood Cultures x 2 sites     Status: None (Preliminary result)   Collection Time: 01/16/19 11:23 PM  Result Value Ref Range Status   Specimen Description BLOOD RIGHT HAND  Final   Special Requests   Final    BOTTLES DRAWN AEROBIC AND ANAEROBIC Blood Culture adequate volume   Culture   Final    NO GROWTH 2 DAYS Performed at [facility], [place] [address] [place], [place] [address]    Report Status PENDING  Incomplete  Surgical pcr screen     Status: Abnormal   Collection Time: 01/18/19  8:56 AM  Result Value Ref Range Status   MRSA, PCR POSITIVE (A) NEGATIVE Final    Comment: RESULT CALLED TO, READ BACK BY AND VERIFIED WITH: Brooke Pace RN 11:15 01/18/19 ([email or web address])    Staphylococcus aureus POSITIVE (A) NEGATIVE Final    Comment: (NOTE) The Xpert SA Assay (FDA approved for NASAL specimens in patients 59 years of age and older), is one component of a comprehensive surveillance program. It is not intended to diagnose infection nor to guide or monitor treatment. Performed at [facility], [place] [address] [place],  [address]       Studies: No results found.  Scheduled Meds: . amLODipine  10 mg Oral Daily  . aspirin EC  81 mg Oral Daily  . carvedilol  25 mg Oral BID WC  . Chlorhexidine Gluconate Cloth  6 each Topical Q0600  . cloNIDine  0.2 mg Oral TID  . docusate sodium  100 mg Oral Daily  .  feeding supplement (PRO-STAT SUGAR FREE 64)  30 mL Oral BID  . heparin  5,000 Units Subcutaneous Q8H  . multivitamin  1 tablet Oral QHS  . mupirocin ointment  1 application Nasal BID  . pantoprazole  40 mg Oral Daily  . povidone-iodine   Topical BID  . sevelamer carbonate  3,200 mg Oral TID WC  . sodium chloride flush  3 mL Intravenous Q12H    Continuous Infusions: . sodium chloride    . sodium chloride    . sodium chloride    .  ceFAZolin (ANCEF) IV    . magnesium sulfate 1 - 4 g bolus IVPB       LOS: 3 days     Cristal Deer, MD [facility]  To reach me or the doctor on call, go to: [email or web address] Password [facility]  01/19/2019, 2:23 PM

## 2019-01-20 LAB — CBC WITH DIFFERENTIAL/PLATELET
Abs Immature Granulocytes: 0.12 10*3/uL — ABNORMAL HIGH (ref 0.00–0.07)
Basophils Absolute: 0.1 10*3/uL (ref 0.0–0.1)
Basophils Relative: 0 %
Eosinophils Absolute: 0.1 10*3/uL (ref 0.0–0.5)
Eosinophils Relative: 1 %
HCT: 23.4 % — ABNORMAL LOW (ref 36.0–46.0)
Hemoglobin: 7.3 g/dL — ABNORMAL LOW (ref 12.0–15.0)
Immature Granulocytes: 1 %
Lymphocytes Relative: 8 %
Lymphs Abs: 1 10*3/uL (ref 0.7–4.0)
MCH: 30.8 pg (ref 26.0–34.0)
MCHC: 31.2 g/dL (ref 30.0–36.0)
MCV: 98.7 fL (ref 80.0–100.0)
Monocytes Absolute: 1.2 10*3/uL — ABNORMAL HIGH (ref 0.1–1.0)
Monocytes Relative: 9 %
Neutro Abs: 10.5 10*3/uL — ABNORMAL HIGH (ref 1.7–7.7)
Neutrophils Relative %: 81 %
Platelets: 264 10*3/uL (ref 150–400)
RBC: 2.37 MIL/uL — ABNORMAL LOW (ref 3.87–5.11)
RDW: 14.9 % (ref 11.5–15.5)
WBC: 13.1 10*3/uL — ABNORMAL HIGH (ref 4.0–10.5)
nRBC: 0 % (ref 0.0–0.2)

## 2019-01-20 LAB — BASIC METABOLIC PANEL
Anion gap: 15 (ref 5–15)
BUN: 36 mg/dL — ABNORMAL HIGH (ref 6–20)
CO2: 27 mmol/L (ref 22–32)
Calcium: 9.1 mg/dL (ref 8.9–10.3)
Chloride: 92 mmol/L — ABNORMAL LOW (ref 98–111)
Creatinine, Ser: 8.1 mg/dL — ABNORMAL HIGH (ref 0.44–1.00)
GFR calc Af Amer: 7 mL/min — ABNORMAL LOW (ref >60)
GFR calc non Af Amer: 6 mL/min — ABNORMAL LOW (ref >60)
Glucose, Bld: 113 mg/dL — ABNORMAL HIGH (ref 70–99)
Potassium: 3.5 mmol/L (ref 3.5–5.1)
Sodium: 134 mmol/L — ABNORMAL LOW (ref 135–145)

## 2019-01-20 LAB — HEPATITIS B SURFACE ANTIBODY,QUALITATIVE: Hep B S Ab: NONREACTIVE

## 2019-01-20 LAB — HEPATITIS B CORE ANTIBODY, TOTAL: Hep B Core Total Ab: NEGATIVE

## 2019-01-20 LAB — HEPATITIS B SURFACE ANTIGEN: Hepatitis B Surface Ag: NEGATIVE

## 2019-01-20 NOTE — Progress Notes (Signed)
Subjective:  Tolerated hd yesterday , minamal foot discomfort today   Objective Vital signs in last 24 hours: Vitals:   01/19/19 2355 01/20/19 0427 01/20/19 0801 01/20/19 1157  BP: 120/67 131/70 135/89 120/69  Pulse: 94 92 90 83  Resp: 17 16 20 17   Temp:  98.2 F (36.8 C) 98.1 F (36.7 C) 99.8 F (37.7 C)  TempSrc: Oral Oral Oral Oral  SpO2: 95% 99% 98% 98%  Weight:      Height:       Weight change: 2.142 kg  Physical Exam: General: Alert Obese, Wd, WN  AAF NAD  Heart: RRR no r, m, g Lungs: CTA  Abdomen: Obese, BS+, soft , NT, ND Extremities:  Trace pedal edema / L foot surgical site stable no bld. / R Leg sp TMA  Dialysis Access: Left BCF  = bruit      OP Hemodialysis prescription: Monday/Wednesday/Friday, [facility], 4 hours 15 minutes, EDW 90 kg, BFR 400, DFR 800, 2K/2.5 calcium, no UF profile/sodium modeling.  Mircera 100 mcg every 2 weeks (last dose 1/22), Hectorol 5 mcg q. dialysis and Parsabiv 7.5 mg q. dialysis.  Left BCF  Problem/Plan: 1.  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 Dr Donnetta Hutching / Admit team  Following  And Appears to be doing well from a postoperative standpoint. 2.  End-stage renal disease: On hemodialysis MWF , HD yesterday 2/2 need for surgery on admit , next hd tomor Mon on schedule .  Volume status acceptable and will lower her back  her dry weight. 3   Hypertension/ Volume : HD yesterday post wt 88.5  Below edw, bp stable Continue to monitor blood pressures with pain management and ongoing antihypertensive therapy ( may be able to taper back  some bp meds /hemodialysis 4.  Anemia:  HGB 7.3 Secondary to end-stage renal disease and likely aggravated by recent surgical losses.  Status post recent ESA.  (Last dose 1/22)Monitor HGB  5  Secondary hyperparathyroidism: Restart phosphorus binders along with vitamin D receptor analog for PTH suppression.  She is usually on [place]  which is unavailable here./ out pt bath 2.5 ca  Use 2.25 here  With ca ^ .renveal binder  Fu lab pre hd in am     Ernest Haber, PA-C [facility] [phone] 01/20/2019,3:44 PM  LOS: 4 days   Pt seen, examined and agree w A/P as above.  Kelly Splinter MD [facility] pager [phone]   01/20/2019, 3:45 PM    Labs: Basic Metabolic Panel: Recent Labs  Lab 01/17/19 0818 01/18/19 0324 01/18/19 2352 01/19/19 1003 01/20/19 0842  NA 138 136 133* 131* 134*  K 3.6 3.8 4.5 3.9 3.5  CL 98 100 93* 93* 92*  CO2 27 24 23 23 27   GLUCOSE 94 94 132* 138* 113*  BUN 19 35* 49* 56* 36*  CREATININE 6.93* 9.23* 10.98* 11.79* 8.10*  CALCIUM 9.6 9.5 9.1 9.1 9.1  PHOS 4.0 5.2*  --  5.7*  --    Liver Function Tests: Recent Labs  Lab 01/17/19 0818 01/18/19 0324 01/19/19 1003  ALBUMIN 3.5 3.1* 2.8*   No results for input(s): LIPASE, AMYLASE in the last 168 hours. No results for input(s): AMMONIA in the last 168 hours. CBC: Recent Labs  Lab 01/16/19 2030 01/18/19 0324 01/18/19 2352 01/19/19 1002 01/20/19 0842  WBC 8.3 7.6 15.0* 12.7* 13.1*  NEUTROABS 6.2  --   --   --  10.5*  HGB 9.1* 8.9* 7.8* 7.0* 7.3*  HCT 29.1* 28.5* 23.9* 22.8* 23.4*  MCV 99.0 98.3 98.0 98.3 98.7  PLT 285 272 301 297 264   Cardiac Enzymes: No results for input(s): CKTOTAL, CKMB, CKMBINDEX, TROPONINI in the last 168 hours. CBG: No results for input(s): GLUCAP in the last 168 hours.  Studies/Results: No results found. Medications: . sodium chloride    . sodium chloride    . sodium chloride    .  ceFAZolin (ANCEF) IV    . magnesium sulfate 1 - 4 g bolus IVPB     . amLODipine  10 mg Oral Daily  . aspirin EC  81 mg Oral Daily  . carvedilol  25 mg Oral BID WC  . Chlorhexidine Gluconate Cloth  6 each Topical Q0600  . cloNIDine  0.2 mg Oral TID  . docusate sodium  100 mg Oral Daily  . feeding supplement (PRO-STAT SUGAR FREE 64)  30 mL Oral BID  . heparin  5,000 Units  Subcutaneous Q8H  . multivitamin  1 tablet Oral QHS  . mupirocin ointment  1 application Nasal BID  . pantoprazole  40 mg Oral Daily  . povidone-iodine   Topical BID  . sevelamer carbonate  3,200 mg Oral TID WC  . sodium chloride flush  3 mL Intravenous Q12H

## 2019-01-20 NOTE — Progress Notes (Signed)
OT EValuation  PTA, pt lived at home with her niece who assisted with mobility and IADL tasks as needed due to pt being legally blind. Pt currently min A with squat pivot transfer and is unable to ambulate at this time due to pain. Mod A with LB ADL. Feel pt would benefit from short rehab stay at CIR to facilitate return to PLOF and safe DC home. Recommend [facility] once discharged. Will follow acutely.    01/20/19 1600  OT Visit Information  Last OT Received On 01/20/19  Assistance Needed +1  PT/OT/SLP Co-Evaluation/Treatment Yes (partial session)  Reason for Co-Treatment Other (comment) (pt limited by pain)  OT goals addressed during session ADL's and self-care  History of Present Illness 43 y.o. patient s/p L transmet amp and fem-pop bypass 1/14. PMH includes: R transmet amputation, Blindness, ESRD, HTN, PAD. Patient is WB through LLE heel with Darco shoe only per MD.   Precautions  Precautions Fall  Precaution Comments Blind  Required Braces or Orthoses Other Brace (Darco shoe)  Restrictions  Weight Bearing Restrictions Yes  LLE Weight Bearing WBAT  Other Position/Activity Restrictions Through Heel LLE in [place] expects to be discharged to: Inpatient rehab  Living Arrangements Other relatives  Additional Comments Patient lives in 2nd story apartment with niece, has RW only  Prior Function  Level of Independence Needs assistance  Gait / Transfers Assistance Needed household ambulation with niece hands on guarding due to blind. does not moblize much without family or home aide present.  ADL's / Homemaking Assistance Needed assist from niece or home aide for IADL; pt was completing bathing dressing after set up  Communication  Communication No difficulties  Pain Assessment  Faces Pain Scale 8  Pain Location LLE  Pain Descriptors / Indicators Grimacing;Moaning;Discomfort  Pain Intervention(s) Limited activity within patient's  tolerance;Repositioned  Cognition  Arousal/Alertness Awake/alert  Behavior During Therapy WFL for tasks assessed/performed  Overall Cognitive Status Within Functional Limits for tasks assessed  Upper Extremity Assessment  Upper Extremity Assessment Overall WFL for tasks assessed  Cervical / Trunk Assessment  Cervical / Trunk Assessment Normal  ADL  Overall ADL's  Needs assistance/impaired  Grooming Set up;Sitting  Upper Body Bathing Set up;Sitting  Lower Body Bathing Moderate assistance;Sitting/lateral leans  Upper Body Dressing  Set up;Sitting  Lower Body Dressing Moderate assistance;Sitting/lateral leans  Toilet Transfer Minimal assistance;Squat-pivot  Toileting- Clothing Manipulation and Hygiene Moderate assistance;Sitting/lateral lean  Functional mobility during ADLs Minimal assistance;Cueing for safety;Cueing for sequencing (squat pivot)  Vision- History  Baseline Vision/History Legally blind  Bed Mobility  Overal bed mobility Needs Assistance  Bed Mobility Supine to Sit  Supine to sit Min guard  General bed mobility comments guarding to protect surgical site due to blindness, moving well  Transfers  Overall transfer level Needs assistance  Equipment used Rolling walker (2 wheeled)  Squat pivot transfers Min assist  General transfer comment Min A to stand. patient performing lateral scoot transfers with OT upon exit. Not tolerating standing on RLE for 10 seconds due to pain in LLE which was not touching floor at all.    Balance  Overall balance assessment Needs assistance  Sitting balance-Leahy Scale Good  Standing balance-Leahy Scale Poor  OT - End of Session  Equipment Utilized During Treatment Gait belt  Activity Tolerance Patient tolerated treatment well  Patient left in chair;with call bell/phone within reach  Nurse Communication Mobility status;Patient requests pain meds  OT Assessment  OT Recommendation/Assessment  Patient needs continued OT Services  OT Visit  Diagnosis Other abnormalities of gait and mobility (R26.89);Muscle weakness (generalized) (M62.81);Pain  Pain - Right/Left Left  Pain - part of body Ankle and joints of foot  OT Problem List Decreased strength;Decreased activity tolerance;Impaired balance (sitting and/or standing);Decreased safety awareness;Decreased knowledge of use of DME or AE;Decreased knowledge of precautions;Obesity;Pain  OT Plan  OT Frequency (ACUTE ONLY) Min 2X/week  OT Treatment/Interventions (ACUTE ONLY) Self-care/ADL training;Therapeutic exercise;Energy conservation;DME and/or AE instruction;Therapeutic activities;Visual/perceptual remediation/compensation;Patient/family education;Balance training  AM-PAC OT "6 Clicks" Daily Activity Outcome Measure (Version 2)  Help from another person eating meals? 3  Help from another person taking care of personal grooming? 3  Help from another person toileting, which includes using toliet, bedpan, or urinal? 2  Help from another person bathing (including washing, rinsing, drying)? 2  Help from another person to put on and taking off regular upper body clothing? 3  Help from another person to put on and taking off regular lower body clothing? 2  6 Click Score 15  OT Recommendation  Recommendations for Other Services Rehab consult  Follow Up Recommendations CIR;Supervision - Intermittent  OT Equipment 3 in 1 bedside commode  Individuals Consulted  Consulted and Agree with Results and Recommendations Patient  Acute Rehab OT Goals  Patient Stated Goal less pain, rehab before home  OT Goal Formulation With patient  Time For Goal Achievement 02/03/19  Potential to Achieve Goals Good  OT Time Calculation  OT Start Time (ACUTE ONLY) 1514  OT Stop Time (ACUTE ONLY) 1544  OT Time Calculation (min) 30 min  OT General Charges  $OT Visit 1 Visit  OT Evaluation  $OT Eval Moderate Complexity 1 Mod  Written Expression  Dominant Hand Right  Maurie Boettcher, OT/L   Acute OT Clinical  Specialist Acute Rehabilitation Services Pager [phone] Office [phone]

## 2019-01-20 NOTE — Progress Notes (Signed)
Rehab Admissions Coordinator Note:  Patient was screened by Cleatrice Burke for appropriateness for an Inpatient Acute Rehab Consult per PT recs.  At this time, we are recommending Inpatient Rehab consult.  Cleatrice Burke RN MSN 01/20/2019, 4:02 PM  I can be reached at [phone].

## 2019-01-20 NOTE — Progress Notes (Addendum)
[facility]   Subjective  - Doing OK overall.  No new complaints.   Objective 120/69 83 99.8 F (37.7 C) (Oral) 17 98%  Intake/Output Summary (Last 24 hours) at 01/20/2019 1418 Last data filed at 01/20/2019 1158 Gross per 24 hour  Intake 580 ml  Output -  Net 580 ml    Left TMA incision healing well, dressing removed and left open to air Left leg incisions healing well, groin soft without hematoma Peroneal Doppler signal left LE   Assessment/Planning: POD # 2 43 y.o. female is s/p left femoral to popliteal artery bypass with vein and transmetatarsal amputation on the left for gangrenous toes  Aspirin daily for antiplatelet therapy  PT for mobility heel weight bearing left LE with darco shoe   Roxy Horseman 01/20/2019 2:18 PM --     Laboratory Lab Results: Recent Labs    01/19/19 1002 01/20/19 0842  WBC 12.7* 13.1*  HGB 7.0* 7.3*  HCT 22.8* 23.4*  PLT 297 264   BMET Recent Labs    01/19/19 1003 01/20/19 0842  NA 131* 134*  K 3.9 3.5  CL 93* 92*  CO2 23 27  GLUCOSE 138* 113*  BUN 56* 36*  CREATININE 11.79* 8.10*  CALCIUM 9.1 9.1    COAG No results found for: INR, PROTIME No results found for: PTT  I have interviewed and examined patient with PA and agree with assessment and plan above.   Tria Noguera C. Donzetta Matters, MD [facility] [place] Office: [phone] Pager: [phone]

## 2019-01-20 NOTE — Progress Notes (Signed)
PROGRESS NOTE  Rebekah Burton QHU:[id number] DOB: 1976-01-18 DOA: 01/16/2019 PCP: Patient, [name]  HPI/Recap of past 20 hours: 43 year old female with medical history significant for peripheral artery disease end-stage renal disease on hemodialysis hypertension and blindness.  Subjective: Patient seen at bedside just returned from surgery she had left popliteal femoropopliteal and left metatarsal amputation.  She is supposed to have the dialysis today but she decided she was not going to because she was feeling very tired  Subjective: January 19, 2019 Patient seen and examined at bedside.  Doing better.  The left femoropopliteal incisions intact no evidence of infection.  Patient had hemodialysis today.  Denies any fever chills but the expected pain postsurgical  SUBJECTIVE 01/20/2019: SEEN AND EXAMINED denies any new complaint except for pain.  Assessment/Plan: Principal Problem:   Ischemic ulcer of left foot ([place]) Active Problems:   ESRD (end stage renal disease) ([place])   HTN (hypertension)   Blind in both eyes   PAD (peripheral artery disease) ([place])  1.  Ischemic ulcer of the left foot in the setting of peripheral artery disease status post left metatarsal amputation January 18, 2019.  She is doing well the dressing was removed today stitches are looking good.  She is started physical therapy and she will be going to inpatient rehab  2.  End-stage renal disease patient had hemodialysis yesterday. patient is for hemodialysis tomorrow  3.  Hypertension, uncontrolled continue blood pressure medication  4.  Blindness chronic  5.  Anemia of chronic disease superimposed of possible blood loss secondary to surgery continue to monitor  Code Status: Full  Severity of Illness: The appropriate patient status for this patient is INPATIENT. Inpatient status is judged to be reasonable and necessary in order to provide the required intensity of service to ensure the patient's safety. The  patient's presenting symptoms, physical exam findings, and initial radiographic and laboratory data in the context of their chronic comorbidities is felt to place them at high risk for further clinical deterioration. Furthermore, it is not anticipated that the patient will be medically stable for discharge from the hospital within 2 midnights of admission. The following factors support the patient status of inpatient.   " The patient's presenting symptoms include left foot pain with ulcer failed outpatient antibiotic treatment. " The worrisome physical exam findings include left foot ulcer. " The initial radiographic and laboratory data are worrisome because of occlusion of SFA stenosis of common and profunda femoral. " The chronic co-morbidities include end-stage renal disease on hemodialysis.   * I certify that at the point of admission it is my clinical judgment that the patient will require inpatient hospital care spanning beyond 2 midnights from the point of admission due to high intensity of service, high risk for further deterioration and high frequency of surveillance required.*    Family Communication: None at bedside  Disposition Plan: Home when stable possible discharge tomorrow   Consultants:  Vascular, Dr. Donnetta Hutching  Procedures:  Femoropopliteal bypass on the left and left metatarsal amputation  Hemodialysis  Antimicrobials:  Cefazolin  DVT prophylaxis: SCD   Objective: Vitals:   01/19/19 2355 01/20/19 0427 01/20/19 0801 01/20/19 1157  BP: 120/67 131/70 135/89 120/69  Pulse: 94 92 90 83  Resp: 17 16 20 17   Temp:  98.2 F (36.8 C) 98.1 F (36.7 C) 99.8 F (37.7 C)  TempSrc: Oral Oral Oral Oral  SpO2: 95% 99% 98% 98%  Weight:      Height:  Intake/Output Summary (Last 24 hours) at 01/20/2019 1541 Last data filed at 01/20/2019 1158 Gross per 24 hour  Intake 580 ml  Output -  Net 580 ml   Filed Weights   01/19/19 0320 01/19/19 0820 01/19/19 1236    Weight: 89.9 kg 92 kg 88.5 kg   Body mass index is 33.49 kg/m.  Exam:  . General: 43 y.o. year-old female well developed well nourished in no acute distress.  Alert and oriented x3. . Blindness . Cardiovascular: Regular rate and rhythm with no rubs or gallops.  No thyromegaly or JVD noted.   Marland Kitchen Respiratory: Clear to auscultation with no wheezes or rales. Good inspiratory effort. . Abdomen: Soft nontender nondistended with normal bowel sounds x4 quadrants. . Musculoskeletal: Previous metatarsal amputation of the right foot recent metatarsals amputation of the left foot, mild edema bilaterally Skin:The left femoropopliteal incisions intact no evidence of infection Stitches on the left forefoot is intact .  No ulcerative lesions noted or rashes, . Psychiatry: Mood is appropriate for condition and setting    Data Reviewed: CBC: Recent Labs  Lab 01/16/19 2030 01/18/19 0324 01/18/19 2352 01/19/19 1002 01/20/19 0842  WBC 8.3 7.6 15.0* 12.7* 13.1*  NEUTROABS 6.2  --   --   --  10.5*  HGB 9.1* 8.9* 7.8* 7.0* 7.3*  HCT 29.1* 28.5* 23.9* 22.8* 23.4*  MCV 99.0 98.3 98.0 98.3 98.7  PLT 285 272 301 297 458   Basic Metabolic Panel: Recent Labs  Lab 01/17/19 0818 01/18/19 0324 01/18/19 2352 01/19/19 1003 01/20/19 0842  NA 138 136 133* 131* 134*  K 3.6 3.8 4.5 3.9 3.5  CL 98 100 93* 93* 92*  CO2 27 24 23 23 27   GLUCOSE 94 94 132* 138* 113*  BUN 19 35* 49* 56* 36*  CREATININE 6.93* 9.23* 10.98* 11.79* 8.10*  CALCIUM 9.6 9.5 9.1 9.1 9.1  PHOS 4.0 5.2*  --  5.7*  --    GFR: Estimated Creatinine Clearance: 9.9 mL/min (A) (by C-G formula based on SCr of 8.1 mg/dL (H)). Liver Function Tests: Recent Labs  Lab 01/17/19 0818 01/18/19 0324 01/19/19 1003  ALBUMIN 3.5 3.1* 2.8*   No results for input(s): LIPASE, AMYLASE in the last 168 hours. No results for input(s): AMMONIA in the last 168 hours. Coagulation Profile: No results for input(s): INR, PROTIME in the last 168  hours. Cardiac Enzymes: No results for input(s): CKTOTAL, CKMB, CKMBINDEX, TROPONINI in the last 168 hours. BNP (last 3 results) No results for input(s): PROBNP in the last 8760 hours. HbA1C: No results for input(s): HGBA1C in the last 72 hours. CBG: No results for input(s): GLUCAP in the last 168 hours. Lipid Profile: No results for input(s): CHOL, HDL, LDLCALC, TRIG, CHOLHDL, LDLDIRECT in the last 72 hours. Thyroid Function Tests: No results for input(s): TSH, T4TOTAL, FREET4, T3FREE, THYROIDAB in the last 72 hours. Anemia Panel: No results for input(s): VITAMINB12, FOLATE, FERRITIN, TIBC, IRON, RETICCTPCT in the last 72 hours. Urine analysis: No results found for: COLORURINE, APPEARANCEUR, LABSPEC, PHURINE, GLUCOSEU, HGBUR, BILIRUBINUR, KETONESUR, PROTEINUR, UROBILINOGEN, NITRITE, LEUKOCYTESUR Sepsis Labs: @LABRCNTIP (procalcitonin:4,lacticidven:4)  ) Recent Results (from the past 240 hour(s))  Aerobic Culture (superficial specimen)     Status: None   Collection Time: 01/15/19 12:30 PM  Result Value Ref Range Status   Specimen Description   Final    FOOT LEFT Performed at [facility] [address], [place], [place] [address]    Special Requests   Final    NONE Performed  at [facility], [place] [address], [place], [place] [address]    Gram Stain   Final    RARE WBC PRESENT, PREDOMINANTLY MONONUCLEAR MODERATE GRAM POSITIVE COCCI ABUNDANT GRAM NEGATIVE RODS Performed at [facility], [place] [address], [place], [place] [address]    Culture   Final    ABUNDANT ESCHERICHIA COLI ABUNDANT PSEUDOMONAS AERUGINOSA    Report Status 01/19/2019 FINAL  Final   Organism ID, Bacteria ESCHERICHIA COLI  Final   Organism ID, Bacteria PSEUDOMONAS AERUGINOSA  Final      Susceptibility   Escherichia coli - MIC*    AMPICILLIN >=32 RESISTANT Resistant     CEFAZOLIN 16 SENSITIVE Sensitive     CEFEPIME <=1 SENSITIVE Sensitive     CEFTAZIDIME <=1  SENSITIVE Sensitive     CEFTRIAXONE <=1 SENSITIVE Sensitive     CIPROFLOXACIN <=0.25 SENSITIVE Sensitive     GENTAMICIN <=1 SENSITIVE Sensitive     IMIPENEM <=0.25 SENSITIVE Sensitive     TRIMETH/SULFA <=20 SENSITIVE Sensitive     AMPICILLIN/SULBACTAM >=32 RESISTANT Resistant     PIP/TAZO 8 SENSITIVE Sensitive     Extended ESBL NEGATIVE Sensitive     * ABUNDANT ESCHERICHIA COLI   Pseudomonas aeruginosa - MIC*    CEFTAZIDIME 4 SENSITIVE Sensitive     CIPROFLOXACIN <=0.25 SENSITIVE Sensitive     GENTAMICIN <=1 SENSITIVE Sensitive     IMIPENEM 2 SENSITIVE Sensitive     PIP/TAZO 16 SENSITIVE Sensitive     CEFEPIME 4 SENSITIVE Sensitive     * ABUNDANT PSEUDOMONAS AERUGINOSA  Blood Cultures x 2 sites     Status: None (Preliminary result)   Collection Time: 01/16/19 11:15 PM  Result Value Ref Range Status   Specimen Description BLOOD RIGHT ANTECUBITAL  Final   Special Requests   Final    BOTTLES DRAWN AEROBIC ONLY Blood Culture results may not be optimal due to an inadequate volume of blood received in culture bottles   Culture   Final    NO GROWTH 3 DAYS Performed at [facility], [address], [place], [place] [address]    Report Status PENDING  Incomplete  Blood Cultures x 2 sites     Status: None (Preliminary result)   Collection Time: 01/16/19 11:23 PM  Result Value Ref Range Status   Specimen Description BLOOD RIGHT HAND  Final   Special Requests   Final    BOTTLES DRAWN AEROBIC AND ANAEROBIC Blood Culture adequate volume   Culture   Final    NO GROWTH 3 DAYS Performed at [facility], [place] [address], [place], [place] [address]    Report Status PENDING  Incomplete  Surgical pcr screen     Status: Abnormal   Collection Time: 01/18/19  8:56 AM  Result Value Ref Range Status   MRSA, PCR POSITIVE (A) NEGATIVE Final    Comment: RESULT CALLED TO, READ BACK BY AND VERIFIED WITH: Brooke Pace RN 11:15 01/18/19 ([email or web address])    Staphylococcus aureus POSITIVE (A)  NEGATIVE Final    Comment: (NOTE) The Xpert SA Assay (FDA approved for NASAL specimens in patients 83 years of age and older), is one component of a comprehensive surveillance program. It is not intended to diagnose infection nor to guide or monitor treatment. Performed at [facility], [place] [address] [place],  [address]       Studies: No results found.  Scheduled Meds: . amLODipine  10 mg Oral Daily  . aspirin  EC  81 mg Oral Daily  . carvedilol  25 mg Oral BID WC  . Chlorhexidine Gluconate Cloth  6 each Topical Q0600  . cloNIDine  0.2 mg Oral TID  . docusate sodium  100 mg Oral Daily  . feeding supplement (PRO-STAT SUGAR FREE 64)  30 mL Oral BID  . heparin  5,000 Units Subcutaneous Q8H  . multivitamin  1 tablet Oral QHS  . mupirocin ointment  1 application Nasal BID  . pantoprazole  40 mg Oral Daily  . povidone-iodine   Topical BID  . sevelamer carbonate  3,200 mg Oral TID WC  . sodium chloride flush  3 mL Intravenous Q12H    Continuous Infusions: . sodium chloride    . sodium chloride    . sodium chloride    .  ceFAZolin (ANCEF) IV    . magnesium sulfate 1 - 4 g bolus IVPB       LOS: 4 days     Cristal Deer, MD [facility]  To reach me or the doctor on call, go to: [email or web address] Password [email or web address]  01/20/2019, 3:41 PM

## 2019-01-20 NOTE — Evaluation (Signed)
Physical Therapy Evaluation Patient Details Name: Rebekah Burton MRN: [id number] DOB: 08/11/1976 Today's Date: 01/20/2019   History of Present Illness  43 y.o. patient s/p L transmet amp and fem-pop bypass 1/14. PMH includes: R transmet amputation, Blindness, ESRD, HTN, PAD. Patient is WB through LLE heel with [place] shoe only per MD.     Clinical Impression  Pt admitted with above diagnosis. Pt currently with functional limitations due to the deficits listed below (see PT Problem List). PTA pt living with niece in 2nd story apartment, ambulating without assistive device but with guarding from family due to being blind. Today, patient limited greatly by pain, tolerating short bout of standing with min to mod A, and lateral scoot transfers to chair. Patient pleasant and motivated and would benefit from post acute care to maximize mobility and safety to return home.  Pt will benefit from skilled PT to increase their independence and safety with mobility to allow discharge to the venue listed below.       Follow Up Recommendations CIR    Equipment Recommendations  (TBD)    Recommendations for Other Services Rehab consult     Precautions / Restrictions Precautions Precautions: Fall Precaution Comments: Blind Restrictions Weight Bearing Restrictions: Yes LLE Weight Bearing: Weight bearing as tolerated Other Position/Activity Restrictions: Through Heel LLE in [facility] shoe      Mobility  Bed Mobility Overal bed mobility: Needs Assistance Bed Mobility: Supine to Sit     Supine to sit: Min guard     General bed mobility comments: guarding to protect surgical site due to blindness, moving well  Transfers Overall transfer level: Needs assistance Equipment used: Rolling walker (2 wheeled) Transfers: Sit to/from Stand Sit to Stand: Min assist         General transfer comment: Min A to stand. patient performing lateral scoot transfers with OT upon exit. Not tolerating standing on RLE  for 10 seconds due to pain in LLE which was not touching floor at all.    Ambulation/Gait             General Gait Details: unable at this time  Stairs            Wheelchair Mobility    Modified Rankin (Stroke Patients Only)       Balance Overall balance assessment: Needs assistance   Sitting balance-Leahy Scale: Good       Standing balance-Leahy Scale: Poor                               Pertinent Vitals/Pain Pain Assessment: Faces Faces Pain Scale: Hurts whole lot Pain Location: LLE Pain Descriptors / Indicators: Grimacing;Moaning;Discomfort Pain Intervention(s): Limited activity within patient's tolerance;Monitored during session;Premedicated before session    Home Living Family/patient expects to be discharged to:: Inpatient rehab Living Arrangements: Other relatives               Additional Comments: Patient lives in 2nd story apartment with niece, has RW only    Prior Function Level of Independence: Needs assistance   Gait / Transfers Assistance Needed: household ambulation with niece hands on guarding due to blind. does not moblize much without family or home aide present.  ADL's / Homemaking Assistance Needed: assist from niece or home aide         Hand Dominance        Extremity/Trunk Assessment   Upper Extremity Assessment Upper Extremity Assessment: Overall WFL for tasks assessed;Defer to  OT evaluation    Lower Extremity Assessment Lower Extremity Assessment: Overall WFL for tasks assessed(LLE limited assesment due to pain )       Communication      Cognition Arousal/Alertness: Awake/alert Behavior During Therapy: WFL for tasks assessed/performed Overall Cognitive Status: Within Functional Limits for tasks assessed                                        General Comments      Exercises     Assessment/Plan    PT Assessment Patient needs continued PT services  PT Problem List Pain        PT Treatment Interventions DME instruction;Gait training;Stair training;Functional mobility training    PT Goals (Current goals can be found in the Care Plan section)  Acute Rehab PT Goals Patient Stated Goal: less pain, rehab before home PT Goal Formulation: With patient Time For Goal Achievement: 02/03/19 Potential to Achieve Goals: Good    Frequency Min 4X/week   Barriers to discharge        Co-evaluation               AM-PAC PT "6 Clicks" Mobility  Outcome Measure Help needed turning from your back to your side while in a flat bed without using bedrails?: None Help needed moving from lying on your back to sitting on the side of a flat bed without using bedrails?: None Help needed moving to and from a bed to a chair (including a wheelchair)?: A Little Help needed standing up from a chair using your arms (e.g., wheelchair or bedside chair)?: A Lot Help needed to walk in hospital room?: Total Help needed climbing 3-5 steps with a railing? : Total 6 Click Score: 15    End of Session Equipment Utilized During Treatment: Gait belt Activity Tolerance: Patient tolerated treatment well Patient left: in chair;with call bell/phone within reach Nurse Communication: Mobility status PT Visit Diagnosis: Unsteadiness on feet (R26.81)    Time: 2025-4270 PT Time Calculation (min) (ACUTE ONLY): 25 min   Charges:   PT Evaluation $PT Eval Moderate Complexity: 1 Mod PT Treatments $Gait Training: 8-22 mins       Reinaldo Berber, PT, DPT Acute Rehabilitation Services Pager: [phone] Office: [place] 01/20/2019, 3:44 PM

## 2019-01-20 NOTE — Progress Notes (Cosign Needed Addendum)
Subjective:  Tolerated hd yesterday , minamal foot discomfort today   Objective Vital signs in last 24 hours: Vitals:   01/19/19 2355 01/20/19 0427 01/20/19 0801 01/20/19 1157  BP: 120/67 131/70 135/89 120/69  Pulse: 94 92 90 83  Resp: 17 16 20 17   Temp:  98.2 F (36.8 C) 98.1 F (36.7 C) 99.8 F (37.7 C)  TempSrc: Oral Oral Oral Oral  SpO2: 95% 99% 98% 98%  Weight:      Height:       Weight change: 2.142 kg  Physical Exam: General: Alert Obese, Wd, WN  AAF NAD  Heart: RRR no r, m, g Lungs: CTA  Abdomen: Obese, BS+, soft , NT, ND Extremities:  Trace pedal edema / L foot surgical site stable no bld. / R Leg sp TMA  Dialysis Access: Left BCF  = bruit      OP Hemodialysis prescription: Monday/Wednesday/Friday, [facility], 4 hours 15 minutes, EDW 90 kg, BFR 400, DFR 800, 2K/2.5 calcium, no UF profile/sodium modeling.  Mircera 100 mcg every 2 weeks (last dose 1/22), Hectorol 5 mcg q. dialysis and Parsabiv 7.5 mg q. dialysis.  Left BCF  Problem/Plan: 1.  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   Dr Donnetta Hutching / Admit team  Following  And Appears to be doing well from a postoperative standpoint. 2.  End-stage renal disease: On hemodialysis MWF , HD yesterday 2/2 need for surgery on admit , next hd tomor Mon on schedule .  Volume status acceptable and will lower her back  her dry weight. 3   Hypertension/ Volume : HD yesterday post wt 88.5  Below edw, bp stable Continue to monitor blood pressures with pain management and ongoing antihypertensive therapy ( may be able to taper back  some bp meds /hemodialysis 4.  Anemia:  HGB 7.3 Secondary to end-stage renal disease and likely aggravated by recent surgical losses.  Status post recent ESA.  (Last dose 1/22)Monitor HGB  5  Secondary hyperparathyroidism: Restart phosphorus binders along with vitamin D receptor analog  for PTH suppression.  She is usually on [place] which is unavailable here./ out pt bath 2.5 ca  Use 2.25 here  With ca ^ .renveal binder  Fu lab pre hd in am     Rebekah Haber, PA-C [facility] Beeper [phone] 01/20/2019,1:06 PM  LOS: 4 days   Labs: Basic Metabolic Panel: Recent Labs  Lab 01/17/19 0818 01/18/19 0324 01/18/19 2352 01/19/19 1003 01/20/19 0842  NA 138 136 133* 131* 134*  K 3.6 3.8 4.5 3.9 3.5  CL 98 100 93* 93* 92*  CO2 27 24 23 23 27   GLUCOSE 94 94 132* 138* 113*  BUN 19 35* 49* 56* 36*  CREATININE 6.93* 9.23* 10.98* 11.79* 8.10*  CALCIUM 9.6 9.5 9.1 9.1 9.1  PHOS 4.0 5.2*  --  5.7*  --    Liver Function Tests: Recent Labs  Lab 01/17/19 0818 01/18/19 0324 01/19/19 1003  ALBUMIN 3.5 3.1* 2.8*   No results for input(s): LIPASE, AMYLASE in the last 168 hours. No results for input(s): AMMONIA in the last 168 hours. CBC: Recent Labs  Lab 01/16/19 2030 01/18/19 0324 01/18/19 2352 01/19/19 1002 01/20/19 0842  WBC 8.3 7.6 15.0* 12.7* 13.1*  NEUTROABS 6.2  --   --   --  10.5*  HGB 9.1* 8.9* 7.8* 7.0* 7.3*  HCT 29.1* 28.5* 23.9* 22.8* 23.4*  MCV 99.0 98.3  98.0 98.3 98.7  PLT 285 272 301 297 264   Cardiac Enzymes: No results for input(s): CKTOTAL, CKMB, CKMBINDEX, TROPONINI in the last 168 hours. CBG: No results for input(s): GLUCAP in the last 168 hours.  Studies/Results: No results found. Medications: . sodium chloride    . sodium chloride    . sodium chloride    .  ceFAZolin (ANCEF) IV    . magnesium sulfate 1 - 4 g bolus IVPB     . amLODipine  10 mg Oral Daily  . aspirin EC  81 mg Oral Daily  . carvedilol  25 mg Oral BID WC  . Chlorhexidine Gluconate Cloth  6 each Topical Q0600  . cloNIDine  0.2 mg Oral TID  . docusate sodium  100 mg Oral Daily  . feeding supplement (PRO-STAT SUGAR FREE 64)  30 mL Oral BID  . heparin  5,000 Units Subcutaneous Q8H  . multivitamin  1 tablet Oral QHS  . mupirocin ointment  1 application  Nasal BID  . pantoprazole  40 mg Oral Daily  . povidone-iodine   Topical BID  . sevelamer carbonate  3,200 mg Oral TID WC  . sodium chloride flush  3 mL Intravenous Q12H

## 2019-01-21 ENCOUNTER — Inpatient Hospital Stay (HOSPITAL_COMMUNITY): Payer: Medicare Other

## 2019-01-21 ENCOUNTER — Encounter (HOSPITAL_COMMUNITY): Payer: Self-pay | Admitting: Vascular Surgery

## 2019-01-21 DIAGNOSIS — L97521 Non-pressure chronic ulcer of other part of left foot limited to breakdown of skin: Secondary | ICD-10-CM

## 2019-01-21 DIAGNOSIS — G8918 Other acute postprocedural pain: Secondary | ICD-10-CM

## 2019-01-21 DIAGNOSIS — Z89432 Acquired absence of left foot: Secondary | ICD-10-CM

## 2019-01-21 DIAGNOSIS — Z89431 Acquired absence of right foot: Secondary | ICD-10-CM

## 2019-01-21 DIAGNOSIS — H543 Unqualified visual loss, both eyes: Secondary | ICD-10-CM

## 2019-01-21 DIAGNOSIS — I739 Peripheral vascular disease, unspecified: Secondary | ICD-10-CM

## 2019-01-21 DIAGNOSIS — N186 End stage renal disease: Secondary | ICD-10-CM

## 2019-01-21 DIAGNOSIS — I10 Essential (primary) hypertension: Secondary | ICD-10-CM

## 2019-01-21 DIAGNOSIS — D62 Acute posthemorrhagic anemia: Secondary | ICD-10-CM

## 2019-01-21 LAB — CBC
HCT: 18.9 % — ABNORMAL LOW (ref 36.0–46.0)
Hemoglobin: 5.9 g/dL — CL (ref 12.0–15.0)
MCH: 31.2 pg (ref 26.0–34.0)
MCHC: 31.2 g/dL (ref 30.0–36.0)
MCV: 100 fL (ref 80.0–100.0)
Platelets: 212 10*3/uL (ref 150–400)
RBC: 1.89 MIL/uL — ABNORMAL LOW (ref 3.87–5.11)
RDW: 14.8 % (ref 11.5–15.5)
WBC: 8.3 10*3/uL (ref 4.0–10.5)
nRBC: 0 % (ref 0.0–0.2)

## 2019-01-21 LAB — RENAL FUNCTION PANEL
Albumin: 2.5 g/dL — ABNORMAL LOW (ref 3.5–5.0)
Anion gap: 18 — ABNORMAL HIGH (ref 5–15)
BUN: 60 mg/dL — ABNORMAL HIGH (ref 6–20)
CO2: 22 mmol/L (ref 22–32)
Calcium: 8.7 mg/dL — ABNORMAL LOW (ref 8.9–10.3)
Chloride: 91 mmol/L — ABNORMAL LOW (ref 98–111)
Creatinine, Ser: 10.82 mg/dL — ABNORMAL HIGH (ref 0.44–1.00)
GFR calc Af Amer: 5 mL/min — ABNORMAL LOW (ref >60)
GFR calc non Af Amer: 4 mL/min — ABNORMAL LOW (ref >60)
Glucose, Bld: 135 mg/dL — ABNORMAL HIGH (ref 70–99)
Phosphorus: 4.9 mg/dL — ABNORMAL HIGH (ref 2.5–4.6)
Potassium: 3.6 mmol/L (ref 3.5–5.1)
Sodium: 131 mmol/L — ABNORMAL LOW (ref 135–145)

## 2019-01-21 LAB — PREPARE RBC (CROSSMATCH)

## 2019-01-21 MED ORDER — CEFAZOLIN SODIUM-DEXTROSE 2-4 GM/100ML-% IV SOLN
2.0000 g | INTRAVENOUS | Status: DC
  Administered 2019-01-21: 2 g via INTRAVENOUS
  Filled 2019-01-21: qty 100

## 2019-01-21 MED ORDER — HYDROMORPHONE HCL 1 MG/ML IJ SOLN
INTRAMUSCULAR | Status: AC
  Administered 2019-01-21: 1 mg via INTRAVENOUS
  Filled 2019-01-21: qty 1

## 2019-01-21 MED ORDER — SODIUM CHLORIDE 0.9% IV SOLUTION: Freq: Once | INTRAVENOUS | Status: DC

## 2019-01-21 NOTE — Procedures (Signed)
I was present at this dialysis session. I have reviewed the session itself and made appropriate changes.   Tol HD well, 3K, LUE AVF, 2L UF goal. Check Hb if driving < 7 will give 1u PRBC time permitting.    Filed Weights   01/19/19 0320 01/19/19 0820 01/19/19 1236  Weight: 89.9 kg 92 kg 88.5 kg    Recent Labs  Lab 01/21/19 0706  NA 131*  K 3.6  CL 91*  CO2 22  GLUCOSE 135*  BUN 60*  CREATININE 10.82*  CALCIUM 8.7*  PHOS 4.9*    Recent Labs  Lab 01/16/19 2030  01/18/19 2352 01/19/19 1002 01/20/19 0842  WBC 8.3   < > 15.0* 12.7* 13.1*  NEUTROABS 6.2  --   --   --  10.5*  HGB 9.1*   < > 7.8* 7.0* 7.3*  HCT 29.1*   < > 23.9* 22.8* 23.4*  MCV 99.0   < > 98.0 98.3 98.7  PLT 285   < > 301 297 264   < > = values in this interval not displayed.    Scheduled Meds: . amLODipine  10 mg Oral Daily  . aspirin EC  81 mg Oral Daily  . carvedilol  25 mg Oral BID WC  . Chlorhexidine Gluconate Cloth  6 each Topical Q0600  . cloNIDine  0.2 mg Oral TID  . docusate sodium  100 mg Oral Daily  . feeding supplement (PRO-STAT SUGAR FREE 64)  30 mL Oral BID  . heparin  5,000 Units Subcutaneous Q8H  . multivitamin  1 tablet Oral QHS  . mupirocin ointment  1 application Nasal BID  . pantoprazole  40 mg Oral Daily  . sevelamer carbonate  3,200 mg Oral TID WC  . sodium chloride flush  3 mL Intravenous Q12H   Continuous Infusions: . sodium chloride    . sodium chloride    . sodium chloride    .  ceFAZolin (ANCEF) IV    . magnesium sulfate 1 - 4 g bolus IVPB     PRN Meds:.sodium chloride, sodium chloride, acetaminophen **OR** acetaminophen, alum & mag hydroxide-simeth, bisacodyl, fentaNYL (SUBLIMAZE) injection, guaiFENesin-dextromethorphan, hydrALAZINE, HYDROmorphone (DILAUDID) injection, labetalol, magnesium sulfate 1 - 4 g bolus IVPB, ondansetron (ZOFRAN) IV, ondansetron **OR** [DISCONTINUED] ondansetron (ZOFRAN) IV, oxyCODONE, phenol, potassium chloride, senna-docusate, sevelamer  carbonate, sodium chloride flush   Pearson Grippe  MD 01/21/2019, 8:28 AM

## 2019-01-21 NOTE — Progress Notes (Signed)
ABI has been completed.   Preliminary results in CV Proc.   Abram Sander 01/21/2019 2:08 PM

## 2019-01-21 NOTE — Plan of Care (Signed)

## 2019-01-21 NOTE — Progress Notes (Signed)
Pt keeps moving arm with HD access, causing continuous alarming of arterial pressures. Failure to adhere to education, leading to a system clot. MD notified, tx terminated with 1mins remaining. Pt is alert and well oriented.  2units of PRBCs given during HD tx. VSS post tx, report called to primary RN, Rolena Infante.

## 2019-01-21 NOTE — Progress Notes (Signed)
PROGRESS NOTE  Rebekah Burton UTM:[id number] DOB: 02-10-1976 DOA: 01/16/2019 PCP: Patient, [name]  HPI/Recap of past 23 hours: 43 year old female with medical history significant for peripheral artery disease end-stage renal disease on hemodialysis hypertension and blindness.  Subjective: Patient seen at bedside just returned from surgery she had left popliteal femoropopliteal and left metatarsal amputation.  She is supposed to have the dialysis today but she decided she was not going to because she was feeling very tired  Subjective: January 19, 2019 Patient seen and examined at bedside.  Doing better.  The left femoropopliteal incisions intact no evidence of infection.  Patient had hemodialysis today.  Denies any fever chills but the expected pain postsurgical  SUBJECTIVE 01/20/2019: SEEN AND EXAMINED denies any new complaint except for pain.  January 21, 2019. Subjective: Patient seen and examined at bedside.  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  That she does not even take it at dialysis..  She is complaining of pain in the left foot stump as well as pain in the left upper thigh.  The left upper thighs show some area of breakdown and there is warmth and tenderness in the medial left upper thigh  Assessment/Plan: Principal Problem:   Ischemic ulcer of left foot (HCC) Active Problems:   ESRD (end stage renal disease) (HCC)   HTN (hypertension)   Blind in both eyes   PAD (peripheral artery disease) (HCC)   Post-operative pain   Acute blood loss anemia   S/P transmetatarsal amputation of foot, left (HCC)   S/P transmetatarsal amputation of foot, right ([place])  1.  Ischemic ulcer of the left foot in the setting of peripheral artery disease status post left metatarsal  amputation January 18, 2019.  She is doing well the dressing was removed today stitches are looking good.  She is started physical therapy and she will be going to inpatient rehab  2.  End-stage renal disease patient had hemodialysis yesterday. patient is for hemodialysis today  3.  Hypertension, uncontrolled continue blood pressure medication  4.  Blindness chronic  5.  Anemia of chronic disease superimposed of possible blood loss secondary to surgery.  Her hemoglobin is 5.9.  She has hemodialysis today, continue to monitor  Code Status: Full  Severity of Illness: The appropriate patient status for this patient is INPATIENT. Inpatient status is judged to be reasonable and necessary in order to provide the required intensity of service to ensure the patient's safety. The patient's presenting symptoms, physical exam findings, and initial radiographic and laboratory data in the context of their chronic comorbidities is felt to place them at high risk for further clinical deterioration. Furthermore, it is not anticipated that the patient will be medically stable for discharge from the hospital within 2 midnights of admission. The following factors support the patient status of inpatient.   " The patient's presenting symptoms include left foot pain with ulcer failed outpatient antibiotic treatment. " The worrisome physical exam findings include left foot ulcer. " The initial radiographic and laboratory data are worrisome because of occlusion of SFA stenosis of common and profunda femoral. " The chronic co-morbidities include end-stage renal disease on hemodialysis.   * I certify that at the point of admission it is  my clinical judgment that the patient will require inpatient hospital care spanning beyond 2 midnights from the point of admission due to high intensity of service, high risk for further deterioration and high frequency of surveillance required.*    Family Communication: None at  bedside  Disposition Plan: Home when stable possible discharge tomorrow   Consultants:  Vascular, Dr. Donnetta Hutching  Procedures:  Femoropopliteal bypass on the left and left metatarsal amputation  Hemodialysis  Antimicrobials:  Cefazolin  DVT prophylaxis: SCD   Objective: Vitals:   01/21/19 1039 01/21/19 1044 01/21/19 1100 01/21/19 1300  BP: 123/73 110/65 102/64 117/78  Pulse:  71 79   Resp:  18  16  Temp:  97.9 F (36.6 C)  97.6 F (36.4 C)  TempSrc:    Oral  SpO2:      Weight:      Height:        Intake/Output Summary (Last 24 hours) at 01/21/2019 1351 Last data filed at 01/21/2019 1044 Gross per 24 hour  Intake 730 ml  Output -  Net 730 ml   Filed Weights   01/19/19 0820 01/19/19 1236 01/21/19 0801  Weight: 92 kg 88.5 kg 91.5 kg   Body mass index is 34.63 kg/m.  Exam:  . General: 43 y.o. year-old female well developed well nourished in no acute distress.  Alert and oriented x3. . Blindness . Cardiovascular: Regular rate and rhythm with no rubs or gallops.  No thyromegaly or JVD noted.   Marland Kitchen Respiratory: Clear to auscultation with no wheezes or rales. Good inspiratory effort. . Abdomen: Soft nontender nondistended with normal bowel sounds x4 quadrants. . Musculoskeletal: Previous metatarsal amputation of the right foot recent metatarsals amputation of the left foot, mild edema bilaterally Skin:The left femoropopliteal incisions intact.  There is some drainage that is soaking the dressing   The left upper thighs show some area of breakdown of the stitches and there is warmth and tenderness in the medial left upper thigh .  Marland Kitchen Psychiatry: Mood is appropriate for condition and setting    Data Reviewed: CBC: Recent Labs  Lab 01/16/19 2030 01/18/19 0324 01/18/19 2352 01/19/19 1002 01/20/19 0842 01/21/19 0838  WBC 8.3 7.6 15.0* 12.7* 13.1* 8.3  NEUTROABS 6.2  --   --   --  10.5*  --   HGB 9.1* 8.9* 7.8* 7.0* 7.3* 5.9*  HCT 29.1* 28.5* 23.9* 22.8* 23.4*  18.9*  MCV 99.0 98.3 98.0 98.3 98.7 100.0  PLT 285 272 301 297 264 270   Basic Metabolic Panel: Recent Labs  Lab 01/17/19 0818 01/18/19 0324 01/18/19 2352 01/19/19 1003 01/20/19 0842 01/21/19 0706  NA 138 136 133* 131* 134* 131*  K 3.6 3.8 4.5 3.9 3.5 3.6  CL 98 100 93* 93* 92* 91*  CO2 27 24 23 23 27 22   GLUCOSE 94 94 132* 138* 113* 135*  BUN 19 35* 49* 56* 36* 60*  CREATININE 6.93* 9.23* 10.98* 11.79* 8.10* 10.82*  CALCIUM 9.6 9.5 9.1 9.1 9.1 8.7*  PHOS 4.0 5.2*  --  5.7*  --  4.9*   GFR: Estimated Creatinine Clearance: 7.6 mL/min (A) (by C-G formula based on SCr of 10.82 mg/dL (H)). Liver Function Tests: Recent Labs  Lab 01/17/19 0818 01/18/19 0324 01/19/19 1003 01/21/19 0706  ALBUMIN 3.5 3.1* 2.8* 2.5*   No results for input(s): LIPASE, AMYLASE in the last 168 hours. No results for input(s): AMMONIA in the last 168 hours. Coagulation Profile: No results for input(s): INR, PROTIME in the last  168 hours. Cardiac Enzymes: No results for input(s): CKTOTAL, CKMB, CKMBINDEX, TROPONINI in the last 168 hours. BNP (last 3 results) No results for input(s): PROBNP in the last 8760 hours. HbA1C: No results for input(s): HGBA1C in the last 72 hours. CBG: No results for input(s): GLUCAP in the last 168 hours. Lipid Profile: No results for input(s): CHOL, HDL, LDLCALC, TRIG, CHOLHDL, LDLDIRECT in the last 72 hours. Thyroid Function Tests: No results for input(s): TSH, T4TOTAL, FREET4, T3FREE, THYROIDAB in the last 72 hours. Anemia Panel: No results for input(s): VITAMINB12, FOLATE, FERRITIN, TIBC, IRON, RETICCTPCT in the last 72 hours. Urine analysis: No results found for: COLORURINE, APPEARANCEUR, LABSPEC, PHURINE, GLUCOSEU, HGBUR, BILIRUBINUR, KETONESUR, PROTEINUR, UROBILINOGEN, NITRITE, LEUKOCYTESUR Sepsis Labs: @LABRCNTIP (procalcitonin:4,lacticidven:4)  ) Recent Results (from the past 240 hour(s))  Aerobic Culture (superficial specimen)     Status: None    Collection Time: 01/15/19 12:30 PM  Result Value Ref Range Status   Specimen Description   Final    FOOT LEFT Performed at [facility] [address], [place], [place] [address]    Special Requests   Final    NONE Performed at [facility], [place] [address] [place], [place] [address]    Gram Stain   Final    RARE WBC PRESENT, PREDOMINANTLY MONONUCLEAR MODERATE GRAM POSITIVE COCCI ABUNDANT GRAM NEGATIVE RODS Performed at [facility], [place] [address], [place], [place] [address]    Culture   Final    ABUNDANT ESCHERICHIA COLI ABUNDANT PSEUDOMONAS AERUGINOSA    Report Status 01/19/2019 FINAL  Final   Organism ID, Bacteria ESCHERICHIA COLI  Final   Organism ID, Bacteria PSEUDOMONAS AERUGINOSA  Final      Susceptibility   Escherichia coli - MIC*    AMPICILLIN >=32 RESISTANT Resistant     CEFAZOLIN 16 SENSITIVE Sensitive     CEFEPIME <=1 SENSITIVE Sensitive     CEFTAZIDIME <=1 SENSITIVE Sensitive     CEFTRIAXONE <=1 SENSITIVE Sensitive     CIPROFLOXACIN <=0.25 SENSITIVE Sensitive     GENTAMICIN <=1 SENSITIVE Sensitive     IMIPENEM <=0.25 SENSITIVE Sensitive     TRIMETH/SULFA <=20 SENSITIVE Sensitive     AMPICILLIN/SULBACTAM >=32 RESISTANT Resistant     PIP/TAZO 8 SENSITIVE Sensitive     Extended ESBL NEGATIVE Sensitive     * ABUNDANT ESCHERICHIA COLI   Pseudomonas aeruginosa - MIC*    CEFTAZIDIME 4 SENSITIVE Sensitive     CIPROFLOXACIN <=0.25 SENSITIVE Sensitive     GENTAMICIN <=1 SENSITIVE Sensitive     IMIPENEM 2 SENSITIVE Sensitive     PIP/TAZO 16 SENSITIVE Sensitive     CEFEPIME 4 SENSITIVE Sensitive     * ABUNDANT PSEUDOMONAS AERUGINOSA  Blood Cultures x 2 sites     Status: None (Preliminary result)   Collection Time: 01/16/19 11:15 PM  Result Value Ref Range Status   Specimen Description BLOOD RIGHT ANTECUBITAL  Final   Special Requests   Final    BOTTLES DRAWN AEROBIC ONLY Blood Culture results may not be optimal due to  an inadequate volume of blood received in culture bottles   Culture   Final    NO GROWTH 4 DAYS Performed at [facility], [place] [address], [place], [place] [address]    Report Status PENDING  Incomplete  Blood Cultures x 2 sites     Status: None (Preliminary result)   Collection Time: 01/16/19 11:23 PM  Result Value Ref Range Status   Specimen Description BLOOD  RIGHT HAND  Final   Special Requests   Final    BOTTLES DRAWN AEROBIC AND ANAEROBIC Blood Culture adequate volume   Culture   Final    NO GROWTH 4 DAYS Performed at [facility], [address] [place], [place] [address]    Report Status PENDING  Incomplete  Surgical pcr screen     Status: Abnormal   Collection Time: 01/18/19  8:56 AM  Result Value Ref Range Status   MRSA, PCR POSITIVE (A) NEGATIVE Final    Comment: RESULT CALLED TO, READ BACK BY AND VERIFIED WITH: Brooke Pace RN 11:15 01/18/19 ([email or web address])    Staphylococcus aureus POSITIVE (A) NEGATIVE Final    Comment: (NOTE) The Xpert SA Assay (FDA approved for NASAL specimens in patients 43 years of age and older), is one component of a comprehensive surveillance program. It is not intended to diagnose infection nor to guide or monitor treatment. Performed at [facility], [place] [address], [place], [place] [address]       Studies: No results found.  Scheduled Meds: . sodium chloride   Intravenous Once  . amLODipine  10 mg Oral Daily  . aspirin EC  81 mg Oral Daily  . carvedilol  25 mg Oral BID WC  . Chlorhexidine Gluconate Cloth  6 each Topical Q0600  . cloNIDine  0.2 mg Oral TID  . docusate sodium  100 mg Oral Daily  . feeding supplement (PRO-STAT SUGAR FREE 64)  30 mL Oral BID  . heparin  5,000 Units Subcutaneous Q8H  . multivitamin  1 tablet Oral QHS  . mupirocin ointment  1 application Nasal BID  . pantoprazole  40 mg Oral Daily  . sevelamer carbonate  3,200 mg Oral TID WC  . sodium chloride flush  3 mL Intravenous Q12H     Continuous Infusions: . sodium chloride    . sodium chloride    . sodium chloride    .  ceFAZolin (ANCEF) IV    . magnesium sulfate 1 - 4 g bolus IVPB       LOS: 5 days     Cristal Deer, MD [facility]  To reach me or the doctor on call, go to: [email or web address] Password [email or web address]  01/21/2019, 1:51 PM

## 2019-01-21 NOTE — Progress Notes (Addendum)
  Progress Note    01/21/2019 7:35 AM 3 Days Post-Op  Subjective:  Still having pain to incision sites of L thigh and calf   Vitals:   01/21/19 0007 01/21/19 0400  BP:  (!) 108/56  Pulse: 81 83  Resp: 17 18  Temp: 98.7 F (37.1 C) 99 F (37.2 C)  SpO2: 90% 94%   Physical Exam: Lungs:  Non labored Incisions:  Incisions of L groin, medial thigh, and popliteal are c/d/i; L transmet dressing left in place Extremities:  L ATA brisk by doppler; pain to light palpation around incisions Abdomen:  Soft Neurologic: A&O  CBC    Component Value Date/Time   WBC 13.1 (H) 01/20/2019 0842   RBC 2.37 (L) 01/20/2019 0842   HGB 7.3 (L) 01/20/2019 0842   HCT 23.4 (L) 01/20/2019 0842   PLT 264 01/20/2019 0842   MCV 98.7 01/20/2019 0842   MCH 30.8 01/20/2019 0842   MCHC 31.2 01/20/2019 0842   RDW 14.9 01/20/2019 0842   LYMPHSABS 1.0 01/20/2019 0842   MONOABS 1.2 (H) 01/20/2019 0842   EOSABS 0.1 01/20/2019 0842   BASOSABS 0.1 01/20/2019 0842    BMET    Component Value Date/Time   NA 134 (L) 01/20/2019 0842   K 3.5 01/20/2019 0842   CL 92 (L) 01/20/2019 0842   CO2 27 01/20/2019 0842   GLUCOSE 113 (H) 01/20/2019 0842   BUN 36 (H) 01/20/2019 0842   CREATININE 8.10 (H) 01/20/2019 0842   CALCIUM 9.1 01/20/2019 0842   GFRNONAA 6 (L) 01/20/2019 0842   GFRAA 7 (L) 01/20/2019 0842    INR No results found for: INR   Intake/Output Summary (Last 24 hours) at 01/21/2019 0735 Last data filed at 01/20/2019 1700 Gross per 24 hour  Intake 400 ml  Output -  Net 400 ml     Assessment/Plan:  43 y.o. female is s/p L fem-pop with vein and TMA 3 Days Post-Op   Perfusing L foot well with brisk L ATA by doppler Heel weight bearing only; must have darco shoe on when weight bearing HD today per Nephrology Ok to leave L TMA open to air when patient returns from dialysis PT recommending CIR   Dagoberto Ligas, PA-C [facility] [phone] 01/21/2019 7:35 AM  I have  independently interviewed and examined patient and agree with PA assessment and plan above.  ABI on left 0.69 and TMA appears to be healing well.  Dispo pending approval.  Eda Paschal. Donzetta Matters, MD [facility] Office: [phone] Pager: [phone]

## 2019-01-21 NOTE — Consult Note (Signed)
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 Per chart review and patient, patient lives with her niece in a second story apartment. Patient uses a rolling walker and niece does assist due to blindness. Niece does work during the day patient has a home health aid 1-1/2 hours daily. Presented 01/16/2019 with gangrenous left foot and ischemic changes. Underwent angiogram showing left common femoral artery severely diseased and SFA occluded. Underwent left common femoral and profunda endarterectomy and vein patch angioplasty, left femoral to below-knee popliteal bypass as well as left transmetatarsal amputation 01/18/2019 per Dr. Donnetta Hutching. Hospital course pain management. Weightbearing through left heel only with Darco shoe. PCR screening positive with contact precautions.Hemodialysis ongoing. Subcutaneous heparin for DVT prophylaxis. Acute blood loss anemia 5.9. Therapy evaluations completed with recommendations of physical medicine rehabilitation consult.   Review of Systems  Constitutional: Negative for chills and fever.  HENT: Negative for hearing loss.   Eyes:       Blind  Respiratory: Negative for cough and shortness of breath.   Cardiovascular: Positive for leg swelling. Negative for chest pain and palpitations.  Gastrointestinal: Positive for constipation. Negative for nausea.  Genitourinary: Negative for dysuria, flank pain and hematuria.  Musculoskeletal: Positive for joint pain and myalgias.  Skin: Negative for rash.  Neurological: Positive for focal weakness.  All other systems reviewed and are negative.  Past Medical  History:  Diagnosis Date  . Blind   . ESRD (end stage renal disease) ([place])   . Foot ulceration ([place]) 12/2018  . Hypertension   . PAD (peripheral artery disease) ([place])    Past Surgical History:  Procedure Laterality Date  . ABDOMINAL AORTOGRAM W/LOWER EXTREMITY Bilateral 01/17/2019   Procedure: ABDOMINAL AORTOGRAM W/LOWER EXTREMITY;  Surgeon: Waynetta Sandy, MD;  Location: [facility];  Service: Cardiovascular;  Laterality: Bilateral;  . ABDOMINAL HYSTERECTOMY    . NO PAST SURGERIES    . toe removal Right    All toes on right foot have been removed.    Family History  Problem Relation Age of Onset  . Peripheral Artery Disease Neg Hx   . Kidney failure Neg Hx    Social History:  reports that she has been smoking cigarettes. She has a 10.00 pack-year smoking history. She has never used smokeless tobacco. She reports that she does not drink alcohol or use drugs. Allergies:  Allergies  Allergen Reactions  . Percocet [Oxycodone-Acetaminophen] Hives   Medications Prior to Admission  Medication Sig Dispense Refill  . amLODipine (NORVASC) 10 MG tablet Take 10 mg by mouth daily.    . Aspirin-Acetaminophen-Caffeine (GOODY HEADACHE PO) Take 1 packet by mouth as needed (for headaches).    Marland Kitchen b complex-vitamin c-folic acid (NEPHRO-VITE) 0.8 MG TABS tablet Take 1 tablet by mouth every Monday, Wednesday, and Friday with hemodialysis.    Marland Kitchen carvedilol (COREG) 25 MG tablet Take 25 mg by mouth 2 (two) times daily.    . cloNIDine (CATAPRES) 0.2 MG tablet Take 0.2 mg by mouth 3 (three) times daily.    Marland Kitchen lidocaine-prilocaine (EMLA) cream Apply 1 application topically every Monday, Wednesday, and Friday with hemodialysis.     Marland Kitchen RENVELA 800 MG tablet Take 1,600-3,200 mg by mouth See admin instructions.  Take 3,200 mg by mouth three times a day with meals and 1,600 mg with each snack      Home: Home Living Family/patient expects to be discharged to:: Inpatient rehab Living  Arrangements: Other relatives Additional Comments: Patient lives in 2nd story apartment with niece, has RW only  Functional History: Prior Function Level of Independence: Needs assistance Gait / Transfers Assistance Needed: household ambulation with niece hands on guarding due to blind. does not moblize much without family or home aide present. ADL's / Homemaking Assistance Needed: assist from niece or home aide for IADL; pt was completing bathing dressing after set up Functional Status:  Mobility: Bed Mobility Overal bed mobility: Needs Assistance Bed Mobility: Supine to Sit Supine to sit: Min guard General bed mobility comments: guarding to protect surgical site due to blindness, moving well Transfers Overall transfer level: Needs assistance Equipment used: Rolling walker (2 wheeled) Transfers: Sit to/from Stand Sit to Stand: Min assist Squat pivot transfers: Min assist General transfer comment: Min A to stand. patient performing lateral scoot transfers with OT upon exit. Not tolerating standing on RLE for 10 seconds due to pain in LLE which was not touching floor at all.   Ambulation/Gait General Gait Details: unable at this time    ADL: ADL Overall ADL's : Needs assistance/impaired Grooming: Set up, Sitting Upper Body Bathing: Set up, Sitting Lower Body Bathing: Moderate assistance, Sitting/lateral leans Upper Body Dressing : Set up, Sitting Lower Body Dressing: Moderate assistance, Sitting/lateral leans Toilet Transfer: Minimal assistance, Squat-pivot Toileting- Clothing Manipulation and Hygiene: Moderate assistance, Sitting/lateral lean Functional mobility during ADLs: Minimal assistance, Cueing for safety, Cueing for sequencing(squat pivot)  Cognition: Cognition Overall Cognitive Status: Within Functional Limits for tasks assessed Orientation Level: Oriented X4 Cognition Arousal/Alertness: Awake/alert Behavior During Therapy: WFL for tasks assessed/performed Overall  Cognitive Status: Within Functional Limits for tasks assessed  Blood pressure (!) 108/56, pulse 83, temperature 99 F (37.2 C), temperature source Oral, resp. rate 18, height 5\' 4"  (1.626 m), weight 88.5 kg, SpO2 94 %. Physical Exam  Vitals reviewed. Constitutional: She is oriented to person, place, and time. She appears well-developed.  Obese  HENT:  Head: Normocephalic and atraumatic.  Eyes: Right eye exhibits no discharge. Left eye exhibits no discharge.  Patient is blind  Neck: Normal range of motion. Neck supple. No thyromegaly present.  Cardiovascular: Normal rate and regular rhythm.  Respiratory: Effort normal and breath sounds normal. No respiratory distress.  GI: Soft. Bowel sounds are normal. She exhibits no distension.  Musculoskeletal:     Comments: Left foot edema and tenderness with transmetatarsal amputation Right transmetatarsal amputation  Neurological: She is alert and oriented to person, place, and time.  Follows commands.  Motor: B/l UE: 5/5 proximal to distal RLE: 5/5 proximal to distal LLE: 2+/5 proximal to distal (pain inhibition)  Skin:  Right transmetatarsal amputation site well-healed.  Left transmetatarsal amputation site is dressed with some drainage through dressing  Psychiatric: She has a normal mood and affect. Her behavior is normal.    Results for orders placed or performed during the hospital encounter of 01/16/19 (from the past 24 hour(s))  CBC with Differential/Platelet     Status: Abnormal   Collection Time: 01/20/19  8:42 AM  Result Value Ref Range   WBC 13.1 (H) 4.0 - 10.5 K/uL   RBC 2.37 (L) 3.87 - 5.11 MIL/uL   Hemoglobin 7.3 (L) 12.0 - 15.0 g/dL   HCT 23.4 (L) 36.0 - 46.0 %   MCV 98.7 80.0 -  100.0 fL   MCH 30.8 26.0 - 34.0 pg   MCHC 31.2 30.0 - 36.0 g/dL   RDW 14.9 11.5 - 15.5 %   Platelets 264 150 - 400 K/uL   nRBC 0.0 0.0 - 0.2 %   Neutrophils Relative % 81 %   Neutro Abs 10.5 (H) 1.7 - 7.7 K/uL   Lymphocytes Relative 8 %    Lymphs Abs 1.0 0.7 - 4.0 K/uL   Monocytes Relative 9 %   Monocytes Absolute 1.2 (H) 0.1 - 1.0 K/uL   Eosinophils Relative 1 %   Eosinophils Absolute 0.1 0.0 - 0.5 K/uL   Basophils Relative 0 %   Basophils Absolute 0.1 0.0 - 0.1 K/uL   Immature Granulocytes 1 %   Abs Immature Granulocytes 0.12 (H) 0.00 - 0.07 K/uL  Basic metabolic panel     Status: Abnormal   Collection Time: 01/20/19  8:42 AM  Result Value Ref Range   Sodium 134 (L) 135 - 145 mmol/L   Potassium 3.5 3.5 - 5.1 mmol/L   Chloride 92 (L) 98 - 111 mmol/L   CO2 27 22 - 32 mmol/L   Glucose, Bld 113 (H) 70 - 99 mg/dL   BUN 36 (H) 6 - 20 mg/dL   Creatinine, Ser 8.10 (H) 0.44 - 1.00 mg/dL   Calcium 9.1 8.9 - 10.3 mg/dL   GFR calc non Af Amer 6 (L) >60 mL/min   GFR calc Af Amer 7 (L) >60 mL/min   Anion gap 15 5 - 15   No results found.  Assessment/Plan: Diagnosis: Left transmetatarsal amputation with history of right transmetatarsal amputation with blindness Labs independently reviewed.  Records reviewed and summated above.  1. Does the need for close, 24 hr/day medical supervision in concert with the patient's rehab needs make it unreasonable for this patient to be served in a less intensive setting? Yes  2. Co-Morbidities requiring supervision/potential complications: HTN (monitor and provide prns in accordance with increased physical exertion and pain), blindness, end-stage renal disease with hemodialysis (recs per Nephro), peripheral vascular disease with multiple revascularization procedures, post-op pain management (Biofeedback training with therapies to help reduce reliance on opiate pain medications, particularly IV dilaudid, monitor pain control during therapies, and sedation at rest and titrate to maximum efficacy to ensure participation and gains in therapies), Acute blood loss anemia (repeat labs, transfuse due to critical value) 3. Due to safety, skin/wound care, disease management, medication administration, pain  management and patient education, does the patient require 24 hr/day rehab nursing? Yes 4. Does the patient require coordinated care of a physician, rehab nurse, PT (1-2 hrs/day, 5 days/week) and OT (1-2 hrs/day, 5 days/week) to address physical and functional deficits in the context of the above medical diagnosis(es)? Yes Addressing deficits in the following areas: balance, endurance, locomotion, strength, transferring, bathing, dressing, toileting and psychosocial support 5. Can the patient actively participate in an intensive therapy program of at least 3 hrs of therapy per day at least 5 days per week? Potentially 6. The potential for patient to make measurable gains while on inpatient rehab is excellent 7. Anticipated functional outcomes upon discharge from inpatient rehab are Mod I  with PT, Mod I with OT, n/a with SLP. 8. Estimated rehab length of stay to reach the above functional goals is: 4-7 days. 9. Anticipated D/C setting: Home 10. Anticipated post D/C treatments: HH therapy and Home excercise program 11. Overall Rehab/Functional Prognosis: good  RECOMMENDATIONS: This patient's condition is appropriate for continued rehabilitative care in the  following setting: CIR if discharge location appropriate when patient medically stable and able to tolerate 3 hours of therapy/day. Patient has agreed to participate in recommended program. Yes Note that insurance prior authorization may be required for reimbursement for recommended care.  Comment: Rehab Admissions Coordinator to follow up.   I have personally performed a face to face diagnostic evaluation, including, but not limited to relevant history and physical exam findings, of this patient and developed relevant assessment and plan.  Additionally, I have reviewed and concur with the physician assistant's documentation above.   Delice Lesch, MD, ABPMR Lavon Paganini Angiulli, PA-C 01/21/2019

## 2019-01-21 NOTE — Progress Notes (Signed)
Inpatient Rehabilitation Admissions Coordinator  I met with patient at bedside to discuss her options for rehab. Her barrier for rehab  is her second floor apartment entry. She has no other dispo available. I will discuss with rehab team and follow up tomorrow.  Danne Baxter, RN, MSN Rehab Admissions Coordinator [phone] 01/21/2019 1:43 PM

## 2019-01-21 NOTE — Progress Notes (Signed)
PT Cancellation Note  Patient Details Name: Rebekah Burton MRN: [id number] DOB: 25-Jan-1976   Cancelled Treatment:    Reason Eval/Treat Not Completed: Patient at procedure or test/unavailable. Pt at HD. PT to return as able to complete PT treatment.  Kittie Plater, PT, DPT Acute Rehabilitation Services Pager #: [phone] Office #: [phone]    Berline Lopes 01/21/2019, 10:33 AM

## 2019-01-22 ENCOUNTER — Encounter: Payer: Medicare Other | Admitting: Vascular Surgery

## 2019-01-22 LAB — CBC
HCT: 25.5 % — ABNORMAL LOW (ref 36.0–46.0)
Hemoglobin: 8.2 g/dL — ABNORMAL LOW (ref 12.0–15.0)
MCH: 31.3 pg (ref 26.0–34.0)
MCHC: 32.2 g/dL (ref 30.0–36.0)
MCV: 97.3 fL (ref 80.0–100.0)
Platelets: 244 10*3/uL (ref 150–400)
RBC: 2.62 MIL/uL — ABNORMAL LOW (ref 3.87–5.11)
RDW: 15.2 % (ref 11.5–15.5)
WBC: 11.6 10*3/uL — ABNORMAL HIGH (ref 4.0–10.5)
nRBC: 0 % (ref 0.0–0.2)

## 2019-01-22 LAB — BPAM RBC
Blood Product Expiration Date: 202002262359
Blood Product Expiration Date: 202002262359
ISSUE DATE / TIME: 202001270909
ISSUE DATE / TIME: 202001270909
Unit Type and Rh: 5100
Unit Type and Rh: 5100

## 2019-01-22 LAB — TYPE AND SCREEN
ABO/RH(D): O POS
Antibody Screen: NEGATIVE
Unit division: 0
Unit division: 0

## 2019-01-22 LAB — CULTURE, BLOOD (ROUTINE X 2)
Culture: NO GROWTH
Culture: NO GROWTH
Special Requests: ADEQUATE

## 2019-01-22 MED ORDER — CLONIDINE HCL 0.1 MG PO TABS
0.1000 mg | ORAL_TABLET | Freq: Three times a day (TID) | ORAL | Status: DC
  Administered 2019-01-22 – 2019-01-26 (×12): 0.1 mg via ORAL
  Filled 2019-01-22 (×13): qty 1

## 2019-01-22 MED ORDER — CHLORHEXIDINE GLUCONATE CLOTH 2 % EX PADS: 6.0000 | MEDICATED_PAD | Freq: Every day | CUTANEOUS | Status: DC

## 2019-01-22 NOTE — Progress Notes (Signed)
PROGRESS NOTE    Rebekah Burton  GBT:[id number] DOB: 06/04/1976 DOA: 01/16/2019 PCP: Patient, [name]   Brief Narrative: Rebekah Burton is a 43 y.o. female with a history of PAD, ESRD on HD, hypertension, blindness. Patient presented secondary to foot pain. She has underwent bypass/amputation surgery for PAD/ischemic left foot ulcer with plan for discharge to rehab.   Assessment & Plan:   Principal Problem:   Ischemic ulcer of left foot ([place]) Active Problems:   ESRD (end stage renal disease) ([facility])   HTN (hypertension)   Blind in both eyes   PAD (peripheral artery disease) ([facility])   Post-operative pain   Acute blood loss anemia   S/P transmetatarsal amputation of foot, left ([facility])   Ischemic ulcer of left foot Patient is s/p left metatarsal amputation. Still with significant tenderness on light palpation. Initial plan for CIR, but now recommended to discharge to SNF. -Vascular surgery recommendations: Heel weight bearing only, Darco show when weight bearing; rehab -Discontinue antibiotics since s/p amputation  ESRD on HD -Nephrology management  Essential hypertension Normotensive -Continue amlodipine, Clonidine, Coreg  Pain Secondary to surgery -Continue oxycodone  Blindness Chronic  Acute on chronic anemia Baseline creatinine of about 9. Down to 5.9 s/p surgery. Given 2 units of PRBC with improvement. -Repeat CBC in AM   DVT prophylaxis: SCDs Code Status:   Code Status: Full Code Family Communication: None at bedside Disposition Plan: Discharge to SNF when bed available   Consultants:   Vascular surgery  Nephrology  Procedures:   1/24: Left common femoral and profunda endarterectomy and vein patch angioplasty; Left femoral-below knee popliteal artery bypass; left transmetatarsal amputation  Antimicrobials:  Cefazolin (1/24 >>1/28)   Subjective: Sharp pain that radiates from foot up to her thigh  Objective: Vitals:   01/21/19 2330 01/22/19 0514  01/22/19 0828 01/22/19 1147  BP: (!) 148/57 115/66 (!) 99/46 123/71  Pulse: 97 76 71 77  Resp: 14 14 20 13   Temp: 98.6 F (37 C) 98.5 F (36.9 C) 98.4 F (36.9 C) 99 F (37.2 C)  TempSrc: Axillary Oral Oral Oral  SpO2: 97% 100% 91% 94%  Weight:      Height:        Intake/Output Summary (Last 24 hours) at 01/22/2019 1340 Last data filed at 01/21/2019 1703 Gross per 24 hour  Intake 300 ml  Output -  Net 300 ml   Filed Weights   01/19/19 1236 01/21/19 0801 01/21/19 1145  Weight: 88.5 kg 91.5 kg 90.2 kg    Examination:  General exam: Appears calm and comfortable Respiratory system: Clear to auscultation. Respiratory effort normal. Cardiovascular system: S1 & S2 heard, RRR. Gastrointestinal system: Abdomen is nondistended, soft and nontender. No organomegaly or masses felt. Normal bowel sounds heard. Central nervous system: Alert and oriented. No focal neurological deficits. Extremities: No edema. No calf tenderness. Right transmetatarsal amputation Skin: No cyanosis. No rashes Psychiatry: Judgement and insight appear normal. Mood & affect appropriate.     Data Reviewed: I have personally reviewed following labs and imaging studies  CBC: Recent Labs  Lab 01/16/19 2030  01/18/19 2352 01/19/19 1002 01/20/19 0842 01/21/19 0838 01/22/19 1028  WBC 8.3   < > 15.0* 12.7* 13.1* 8.3 11.6*  NEUTROABS 6.2  --   --   --  10.5*  --   --   HGB 9.1*   < > 7.8* 7.0* 7.3* 5.9* 8.2*  HCT 29.1*   < > 23.9* 22.8* 23.4* 18.9* 25.5*  MCV 99.0   < >  98.0 98.3 98.7 100.0 97.3  PLT 285   < > 301 297 264 212 244   < > = values in this interval not displayed.   Basic Metabolic Panel: Recent Labs  Lab 01/17/19 0818 01/18/19 0324 01/18/19 2352 01/19/19 1003 01/20/19 0842 01/21/19 0706  NA 138 136 133* 131* 134* 131*  K 3.6 3.8 4.5 3.9 3.5 3.6  CL 98 100 93* 93* 92* 91*  CO2 27 24 23 23 27 22   GLUCOSE 94 94 132* 138* 113* 135*  BUN 19 35* 49* 56* 36* 60*  CREATININE 6.93* 9.23*  10.98* 11.79* 8.10* 10.82*  CALCIUM 9.6 9.5 9.1 9.1 9.1 8.7*  PHOS 4.0 5.2*  --  5.7*  --  4.9*   GFR: Estimated Creatinine Clearance: 7.5 mL/min (A) (by C-G formula based on SCr of 10.82 mg/dL (H)). Liver Function Tests: Recent Labs  Lab 01/17/19 0818 01/18/19 0324 01/19/19 1003 01/21/19 0706  ALBUMIN 3.5 3.1* 2.8* 2.5*   No results for input(s): LIPASE, AMYLASE in the last 168 hours. No results for input(s): AMMONIA in the last 168 hours. Coagulation Profile: No results for input(s): INR, PROTIME in the last 168 hours. Cardiac Enzymes: No results for input(s): CKTOTAL, CKMB, CKMBINDEX, TROPONINI in the last 168 hours. BNP (last 3 results) No results for input(s): PROBNP in the last 8760 hours. HbA1C: No results for input(s): HGBA1C in the last 72 hours. CBG: No results for input(s): GLUCAP in the last 168 hours. Lipid Profile: No results for input(s): CHOL, HDL, LDLCALC, TRIG, CHOLHDL, LDLDIRECT in the last 72 hours. Thyroid Function Tests: No results for input(s): TSH, T4TOTAL, FREET4, T3FREE, THYROIDAB in the last 72 hours. Anemia Panel: No results for input(s): VITAMINB12, FOLATE, FERRITIN, TIBC, IRON, RETICCTPCT in the last 72 hours. Sepsis Labs: No results for input(s): PROCALCITON, LATICACIDVEN in the last 168 hours.  Recent Results (from the past 240 hour(s))  Aerobic Culture (superficial specimen)     Status: None   Collection Time: 01/15/19 12:30 PM  Result Value Ref Range Status   Specimen Description   Final    FOOT LEFT Performed at [facility] [address], [place], [place] [address]    Special Requests   Final    NONE Performed at [facility], [place] [address] [place], [place] [address]    Gram Stain   Final    RARE WBC PRESENT, PREDOMINANTLY MONONUCLEAR MODERATE GRAM POSITIVE COCCI ABUNDANT GRAM NEGATIVE RODS Performed at [facility], [place] [address] [place], [place] [address]    Culture   Final      ABUNDANT ESCHERICHIA COLI ABUNDANT PSEUDOMONAS AERUGINOSA    Report Status 01/19/2019 FINAL  Final   Organism ID, Bacteria ESCHERICHIA COLI  Final   Organism ID, Bacteria PSEUDOMONAS AERUGINOSA  Final      Susceptibility   Escherichia coli - MIC*    AMPICILLIN >=32 RESISTANT Resistant     CEFAZOLIN 16 SENSITIVE Sensitive     CEFEPIME <=1 SENSITIVE Sensitive     CEFTAZIDIME <=1 SENSITIVE Sensitive     CEFTRIAXONE <=1 SENSITIVE Sensitive     CIPROFLOXACIN <=0.25 SENSITIVE Sensitive     GENTAMICIN <=1 SENSITIVE Sensitive     IMIPENEM <=0.25 SENSITIVE Sensitive     TRIMETH/SULFA <=20 SENSITIVE Sensitive     AMPICILLIN/SULBACTAM >=32 RESISTANT Resistant     PIP/TAZO 8 SENSITIVE Sensitive     Extended ESBL NEGATIVE Sensitive     * ABUNDANT ESCHERICHIA COLI   Pseudomonas aeruginosa -  MIC*    CEFTAZIDIME 4 SENSITIVE Sensitive     CIPROFLOXACIN <=0.25 SENSITIVE Sensitive     GENTAMICIN <=1 SENSITIVE Sensitive     IMIPENEM 2 SENSITIVE Sensitive     PIP/TAZO 16 SENSITIVE Sensitive     CEFEPIME 4 SENSITIVE Sensitive     * ABUNDANT PSEUDOMONAS AERUGINOSA  Blood Cultures x 2 sites     Status: None   Collection Time: 01/16/19 11:15 PM  Result Value Ref Range Status   Specimen Description BLOOD RIGHT ANTECUBITAL  Final   Special Requests   Final    BOTTLES DRAWN AEROBIC ONLY Blood Culture results may not be optimal due to an inadequate volume of blood received in culture bottles   Culture   Final    NO GROWTH 5 DAYS Performed at [facility], [place] [address] [place], [place] [address]    Report Status 01/22/2019 FINAL  Final  Blood Cultures x 2 sites     Status: None   Collection Time: 01/16/19 11:23 PM  Result Value Ref Range Status   Specimen Description BLOOD RIGHT HAND  Final   Special Requests   Final    BOTTLES DRAWN AEROBIC AND ANAEROBIC Blood Culture adequate volume   Culture   Final    NO GROWTH 5 DAYS Performed at [facility], [place] [address]  [place], [place] [address]    Report Status 01/22/2019 FINAL  Final  Surgical pcr screen     Status: Abnormal   Collection Time: 01/18/19  8:56 AM  Result Value Ref Range Status   MRSA, PCR POSITIVE (A) NEGATIVE Final    Comment: RESULT CALLED TO, READ BACK BY AND VERIFIED WITH: Brooke Pace RN 11:15 01/18/19 ([email or web address])    Staphylococcus aureus POSITIVE (A) NEGATIVE Final    Comment: (NOTE) The Xpert SA Assay (FDA approved for NASAL specimens in patients 62 years of age and older), is one component of a comprehensive surveillance program. It is not intended to diagnose infection nor to guide or monitor treatment. Performed at [facility], [place] [address], [place], [place] [address]          Radiology Studies: Vas [place] Abi With/wo Tbi  Result Date: 01/22/2019 LOWER EXTREMITY DOPPLER STUDY Indications: Peripheral artery disease.  Limitations: patient positioning and pain tolerance Performing Technologist: Abram Sander RVS  Examination Guidelines: A complete evaluation includes at minimum, Doppler waveform signals and systolic blood pressure reading at the level of bilateral brachial, anterior tibial, and posterior tibial arteries, when vessel segments are accessible. Bilateral testing is considered an integral part of a complete examination. Photoelectric Plethysmograph (PPG) waveforms and toe systolic pressure readings are included as required and additional duplex testing as needed. Limited examinations for reoccurring indications may be performed as noted.  ABI Findings: +--------+------------------+-----+---------+--------+ Right   Rt Pressure (mmHg)IndexWaveform Comment  +--------+------------------+-----+---------+--------+ Brachial130                    triphasic         +--------+------------------+-----+---------+--------+ PTA     255               1.96 biphasic          +--------+------------------+-----+---------+--------+ DP      255               1.96  biphasic          +--------+------------------+-----+---------+--------+ +--------+------------------+-----+-------------------+------------------------+ Left    Lt Pressure (mmHg)IndexWaveform  Comment                  +--------+------------------+-----+-------------------+------------------------+ Brachial                                          unable to obtain due to                                                    fistula                  +--------+------------------+-----+-------------------+------------------------+ PTA     90                0.69 dampened monophasic                         +--------+------------------+-----+-------------------+------------------------+ DP                             dampened monophasicunable to obtain                                                           pressure due to pain                                                       tolerance                +--------+------------------+-----+-------------------+------------------------+ +-------+-----------+-----------+------------+------------+ ABI/TBIToday's ABIToday's TBIPrevious ABIPrevious TBI +-------+-----------+-----------+------------+------------+ Right  1.96                                           +-------+-----------+-----------+------------+------------+ Left   0.69                                           +-------+-----------+-----------+------------+------------+  Summary: Right: Resting right ankle-brachial index indicates noncompressible right lower extremity arteries. Left: Resting left ankle-brachial index indicates moderate left lower extremity arterial disease.  *See table(s) above for measurements and observations.  Electronically signed by Ruta Hinds MD on 01/22/2019 at 11:49:53 AM.   Final         Scheduled Meds: . sodium chloride   Intravenous Once  . amLODipine  10 mg Oral Daily  . aspirin EC  81 mg Oral Daily    . carvedilol  25 mg Oral BID WC  . Chlorhexidine Gluconate Cloth  6 each Topical Q0600  . cloNIDine  0.1 mg Oral TID  . docusate sodium  100 mg Oral Daily  . feeding supplement (PRO-STAT SUGAR FREE 64)  30 mL Oral BID  . multivitamin  1 tablet Oral QHS  . mupirocin ointment  1 application Nasal  BID  . pantoprazole  40 mg Oral Daily  . sevelamer carbonate  3,200 mg Oral TID WC  . sodium chloride flush  3 mL Intravenous Q12H   Continuous Infusions: . sodium chloride    . sodium chloride    . sodium chloride    .  ceFAZolin (ANCEF) IV 2 g (01/21/19 2056)  . magnesium sulfate 1 - 4 g bolus IVPB       LOS: 6 days     Cordelia Poche, MD [facility] 01/22/2019, 1:40 PM  If 7PM-7AM, please contact night-coverage [email or web address]

## 2019-01-22 NOTE — Anesthesia Postprocedure Evaluation (Signed)
Anesthesia Post Note  Patient: Rebekah Burton  Procedure(s) Performed: ENDARTERECTOMY FEMORAL with Perfundaplasty (Left Groin) Left  FEMORAL- to  Below the Knee POPLITEAL ARTERY bypass using reversed safenous vein. (Left Leg Lower) TRANSMETATARSAL AMPUTATION (Left Foot)     Patient location during evaluation: PACU Anesthesia Type: General Level of consciousness: awake and alert Pain management: pain level controlled Vital Signs Assessment: post-procedure vital signs reviewed and stable Respiratory status: spontaneous breathing, nonlabored ventilation, respiratory function stable and patient connected to nasal cannula oxygen Cardiovascular status: blood pressure returned to baseline and stable Postop Assessment: no apparent nausea or vomiting Anesthetic complications: no    Last Vitals:  Vitals:   01/21/19 1951 01/21/19 2330  BP: 106/61 (!) 148/57  Pulse: 83 97  Resp: 15 14  Temp: 36.9 C 37 C  SpO2: 93% 97%    Last Pain:  Vitals:   01/21/19 2330  TempSrc: Axillary  PainSc:    Pain Goal: Patients Stated Pain Goal: 2 (01/18/19 1600)                 Montez Hageman

## 2019-01-22 NOTE — Progress Notes (Signed)
Inpatient Rehabilitation Admissions Coordinator  I met with patient at bedside to further discuss her rehab venue options. Her barrier to a brief inpt rehab admit is her 14 to 16 steps entry into her 2 nd floor apartment. She will need a longer rehab stay than we can offer for her to return to her 2nd floor apartment. I recommend SNF and pt is aware and agreeable. I have contacted RN CM, Steffanie Dunn and SW, Caren Griffins. We will sign off at his time.  Danne Baxter, RN, MSN Rehab Admissions Coordinator [phone] 01/22/2019 10:25 AM

## 2019-01-22 NOTE — Progress Notes (Signed)
Occupational Therapy Treatment Patient Details Name: Rebekah Burton MRN: [id number] DOB: [birth year] Today's Date: 01/22/2019    History of present illness 43 y.o. patient s/p L transmet amp and fem-pop bypass 1/14. PMH includes: R transmet amputation, Blindness, ESRD, HTN, PAD. Patient is WB through LLE heel with Darco shoe only per MD.    OT comments  Pt demonstrates progress with lateral transfer this session min (A) level. Pt currently with edema present in L LE with drainage from incision site. Pt reports "I can feel it coming out"  Pt reports poor sleep due to L LE pain throughout night but wants to sit in the chair at this time. Recommendation changed to SNF due to CIR denial.    Follow Up Recommendations  SNF    Equipment Recommendations  3 in 1 bedside commode;Wheelchair (measurements OT);Wheelchair cushion (measurements OT);Hospital bed    Recommendations for Other Services      Precautions / Restrictions Precautions Precautions: Fall Precaution Comments: Blind Restrictions Weight Bearing Restrictions: No LLE Weight Bearing: Weight bearing as tolerated       Mobility Bed Mobility Overal bed mobility: Modified Independent                Transfers Overall transfer level: Needs assistance   Transfers: Lateral/Scoot Transfers Sit to Stand: Min assist         General transfer comment: pt able to progress to chair with hand over hand sensory input to help patient progress due to visual deficits    Balance                                           ADL either performed or assessed with clinical judgement   ADL Overall ADL's : Needs assistance/impaired                         Toilet Transfer: Minimal assistance Toilet Transfer Details (indicate cue type and reason): lateral scoot to drop arm commode           General ADL Comments: demonstrates lateral scoot to chair with arm dropped. pt agreeable to chair level. pt declines  any adl task in chair reports "i have done all that already" of note drainage from L wound.      Vision       Perception     Praxis      Cognition Arousal/Alertness: Awake/alert Behavior During Therapy: WFL for tasks assessed/performed Overall Cognitive Status: Within Functional Limits for tasks assessed                                          Exercises Exercises: Other exercises Other Exercises Other Exercises: educated on L LE knee extension and elevation for edema present at this time   Shoulder Instructions       General Comments      Pertinent Vitals/ Pain       Pain Assessment: Faces Faces Pain Scale: Hurts little more Pain Location: LLE Pain Descriptors / Indicators: Grimacing;Moaning;Discomfort Pain Intervention(s): Monitored during session;Premedicated before session;Repositioned  Home Living  Prior Functioning/Environment              Frequency  Min 2X/week        Progress Toward Goals  OT Goals(current goals can now be found in the care plan section)  Progress towards OT goals: Progressing toward goals  Acute Rehab OT Goals Patient Stated Goal: to do whatever yall tell me to get better OT Goal Formulation: With patient Time For Goal Achievement: 02/03/19 Potential to Achieve Goals: Good ADL Goals Pt Will Perform Lower Body Bathing: with modified independence;sitting/lateral leans;sit to/from stand Pt Will Perform Lower Body Dressing: sit to/from stand;sitting/lateral leans Pt Will Transfer to Toilet: with modified independence;ambulating;bedside commode Pt Will Perform Toileting - Clothing Manipulation and hygiene: with modified independence;sitting/lateral leans Pt Will Perform Tub/Shower Transfer: Shower transfer;ambulating;3 in 1  Plan Discharge plan needs to be updated    Co-evaluation                 AM-PAC OT "6 Clicks" Daily Activity     Outcome  Measure   Help from another person eating meals?: A Little Help from another person taking care of personal grooming?: A Little Help from another person toileting, which includes using toliet, bedpan, or urinal?: A Lot Help from another person bathing (including washing, rinsing, drying)?: A Lot Help from another person to put on and taking off regular upper body clothing?: A Little Help from another person to put on and taking off regular lower body clothing?: A Lot 6 Click Score: 15    End of Session    OT Visit Diagnosis: Unsteadiness on feet (R26.81);Muscle weakness (generalized) (M62.81) Pain - Right/Left: Left Pain - part of body: Leg   Activity Tolerance Patient tolerated treatment well   Patient Left in chair;with call bell/phone within reach;with chair alarm set   Nurse Communication Mobility status;Precautions        Time: 1050-1109 OT Time Calculation (min): 19 min  Charges: OT General Charges $OT Visit: 1 Visit OT Treatments $Self Care/Home Management : 8-22 mins   Jeri Modena, OTR/L  Acute Rehabilitation Services Pager: [phone] Office: [phone] .    Jeri Modena 01/22/2019, 1:17 PM

## 2019-01-22 NOTE — Progress Notes (Addendum)
  Progress Note    01/22/2019 7:52 AM 4 Days Post-Op  Subjective:  Persistent pain to light touch LLE   Vitals:   01/21/19 2330 01/22/19 0514  BP: (!) 148/57 115/66  Pulse: 97 76  Resp: 14 14  Temp: 98.6 F (37 C) 98.5 F (36.9 C)  SpO2: 97% 100%   Physical Exam: Lungs:  Non labored Incisions:  Vein harvest incisions of L thigh with some serosanguinous drainage; L popliteal incision c/d/i; L foot edematous, TMA edges viable, no drainage Extremities:  L DP brisk by doppler Abdomen:  Soft Neurologic: A&O  CBC    Component Value Date/Time   WBC 8.3 01/21/2019 0838   RBC 1.89 (L) 01/21/2019 0838   HGB 5.9 (LL) 01/21/2019 0838   HCT 18.9 (L) 01/21/2019 0838   PLT 212 01/21/2019 0838   MCV 100.0 01/21/2019 0838   MCH 31.2 01/21/2019 0838   MCHC 31.2 01/21/2019 0838   RDW 14.8 01/21/2019 0838   LYMPHSABS 1.0 01/20/2019 0842   MONOABS 1.2 (H) 01/20/2019 0842   EOSABS 0.1 01/20/2019 0842   BASOSABS 0.1 01/20/2019 0842    BMET    Component Value Date/Time   NA 131 (L) 01/21/2019 0706   K 3.6 01/21/2019 0706   CL 91 (L) 01/21/2019 0706   CO2 22 01/21/2019 0706   GLUCOSE 135 (H) 01/21/2019 0706   BUN 60 (H) 01/21/2019 0706   CREATININE 10.82 (H) 01/21/2019 0706   CALCIUM 8.7 (L) 01/21/2019 0706   GFRNONAA 4 (L) 01/21/2019 0706   GFRAA 5 (L) 01/21/2019 0706    INR No results found for: INR   Intake/Output Summary (Last 24 hours) at 01/22/2019 0752 Last data filed at 01/21/2019 1703 Gross per 24 hour  Intake 930 ml  Output 832 ml  Net 98 ml     Assessment/Plan:  43 y.o. female is s/p L fem-pop with vein and TMA 4 Days Post-Op   Perfusing L foot with strong DP by doppler Edematous L foot with TMA; elevate L foot on several pillows when in bed Pain medication as needed Continue Ancef CIR evaluating for potential placement   Dagoberto Ligas, PA-C [facility] [phone] 01/22/2019 7:52 AM  I have examined the patient, reviewed and  agree with above.  Foot well perfused.  Continued discomfort.  Skin edges of transmetatarsal amputation healing.  All Degele incisions healing.  Continue to mobilize and soreness resolves.  Curt Jews, MD 01/22/2019 9:12 AM

## 2019-01-22 NOTE — Progress Notes (Signed)
Subjective:    HD yesterday  2u PRBC  Soon to CIR  No new c/o  Objective Vital signs in last 24 hours: Vitals:   01/21/19 1951 01/21/19 2330 01/22/19 0514 01/22/19 0828  BP: 106/61 (!) 148/57 115/66 (!) 99/46  Pulse: 83 97 76 71  Resp: 15 14 14 20   Temp: 98.5 F (36.9 C) 98.6 F (37 C) 98.5 F (36.9 C) 98.4 F (36.9 C)  TempSrc: Oral Axillary Oral Oral  SpO2: 93% 97% 100% 91%  Weight:      Height:       Weight change:   Physical Exam: General: NAD  Heart: RRR nl s1s2 Lungs: CTA B Abdomen: Obese, BS+, soft , NT, ND Dialysis Access: Left BCF  +B/T     OP Hemodialysis prescription: Monday/Wednesday/Friday, [facility], 4 hours 15 minutes, EDW 90 kg, BFR 400, DFR 800, 2K/2.5 calcium, no UF profile/sodium modeling.  Mircera 100 mcg every 2 weeks (last dose 1/22), Hectorol 5 mcg q. dialysis and Parsabiv 7.5 mg q. dialysis.  Left BCF  Problem/Plan: 1.  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 Dr Donnetta Hutching.  Likely to CIR 2.  End-stage renal disease: On hemodialysis MWF , cont on schedule: 2K 4h No heparin 2-3L UF.   3   Hypertension/ Volume : HD yesterday post wt 88.5  Below edw, BPs hve been soft, reduce clonidine.  4.  Anemia:  S/p 2u pRBC 1/27 for Hb 5.9.  Rpt CBC today. Rec outpt Mircera 1/22 5  Secondary hyperparathyroidism: Cont binders,, VDRA.  She is usually on [place] which is unavailable here./ out pt bath 2.5 ca    Labs: Basic Metabolic Panel: Recent Labs  Lab 01/18/19 0324  01/19/19 1003 01/20/19 0842 01/21/19 0706  NA 136   < > 131* 134* 131*  K 3.8   < > 3.9 3.5 3.6  CL 100   < > 93* 92* 91*  CO2 24   < > 23 27 22   GLUCOSE 94   < > 138* 113* 135*  BUN 35*   < > 56* 36* 60*  CREATININE 9.23*   < > 11.79* 8.10* 10.82*  CALCIUM 9.5   < > 9.1 9.1 8.7*  PHOS 5.2*  --  5.7*  --  4.9*   < > = values in this interval not displayed.   Liver Function Tests: Recent Labs  Lab  01/18/19 0324 01/19/19 1003 01/21/19 0706  ALBUMIN 3.1* 2.8* 2.5*   No results for input(s): LIPASE, AMYLASE in the last 168 hours. No results for input(s): AMMONIA in the last 168 hours. CBC: Recent Labs  Lab 01/16/19 2030 01/18/19 0324 01/18/19 2352 01/19/19 1002 01/20/19 0842 01/21/19 0838  WBC 8.3 7.6 15.0* 12.7* 13.1* 8.3  NEUTROABS 6.2  --   --   --  10.5*  --   HGB 9.1* 8.9* 7.8* 7.0* 7.3* 5.9*  HCT 29.1* 28.5* 23.9* 22.8* 23.4* 18.9*  MCV 99.0 98.3 98.0 98.3 98.7 100.0  PLT 285 272 301 297 264 212   Cardiac Enzymes: No results for input(s): CKTOTAL, CKMB, CKMBINDEX, TROPONINI in the last 168 hours. CBG: No results for input(s): GLUCAP in the last 168 hours.  Studies/Results: Vas [place] Abi With/wo Tbi  Result Date: 01/21/2019 LOWER EXTREMITY DOPPLER STUDY Indications: Peripheral artery disease.  Limitations: patient positioning and pain tolerance Performing Technologist: Abram Sander RVS  Examination Guidelines: A complete evaluation includes at minimum, Doppler  waveform signals and systolic blood pressure reading at the level of bilateral brachial, anterior tibial, and posterior tibial arteries, when vessel segments are accessible. Bilateral testing is considered an integral part of a complete examination. Photoelectric Plethysmograph (PPG) waveforms and toe systolic pressure readings are included as required and additional duplex testing as needed. Limited examinations for reoccurring indications may be performed as noted.  ABI Findings: +--------+------------------+-----+---------+--------+ Right   Rt Pressure (mmHg)IndexWaveform Comment  +--------+------------------+-----+---------+--------+ Brachial130                    triphasic         +--------+------------------+-----+---------+--------+ PTA     255               1.96 biphasic          +--------+------------------+-----+---------+--------+ DP      255               1.96 biphasic           +--------+------------------+-----+---------+--------+ +--------+------------------+-----+-------------------+------------------------+ Left    Lt Pressure (mmHg)IndexWaveform           Comment                  +--------+------------------+-----+-------------------+------------------------+ Brachial                                          unable to obtain due to                                                    fistula                  +--------+------------------+-----+-------------------+------------------------+ PTA     90                0.69 dampened monophasic                         +--------+------------------+-----+-------------------+------------------------+ DP                             dampened monophasicunable to obtain                                                           pressure due to pain                                                       tolerance                +--------+------------------+-----+-------------------+------------------------+ +-------+-----------+-----------+------------+------------+ ABI/TBIToday's ABIToday's TBIPrevious ABIPrevious TBI +-------+-----------+-----------+------------+------------+ Right  1.96                                           +-------+-----------+-----------+------------+------------+ Left   0.69                                           +-------+-----------+-----------+------------+------------+  Summary: Right: Resting right ankle-brachial index indicates noncompressible right lower extremity arteries. Left: Resting left ankle-brachial index indicates moderate left lower extremity arterial disease.  *See table(s) above for measurements and observations.    Preliminary    Medications: . sodium chloride    . sodium chloride    . sodium chloride    .  ceFAZolin (ANCEF) IV 2 g (01/21/19 2056)  . magnesium sulfate 1 - 4 g bolus IVPB     . sodium chloride   Intravenous Once  .  amLODipine  10 mg Oral Daily  . aspirin EC  81 mg Oral Daily  . carvedilol  25 mg Oral BID WC  . Chlorhexidine Gluconate Cloth  6 each Topical Q0600  . cloNIDine  0.2 mg Oral TID  . docusate sodium  100 mg Oral Daily  . feeding supplement (PRO-STAT SUGAR FREE 64)  30 mL Oral BID  . multivitamin  1 tablet Oral QHS  . mupirocin ointment  1 application Nasal BID  . pantoprazole  40 mg Oral Daily  . sevelamer carbonate  3,200 mg Oral TID WC  . sodium chloride flush  3 mL Intravenous Q12H

## 2019-01-23 LAB — RENAL FUNCTION PANEL
Albumin: 2.2 g/dL — ABNORMAL LOW (ref 3.5–5.0)
Anion gap: 15 (ref 5–15)
BUN: 63 mg/dL — ABNORMAL HIGH (ref 6–20)
CO2: 22 mmol/L (ref 22–32)
Calcium: 9.3 mg/dL (ref 8.9–10.3)
Chloride: 96 mmol/L — ABNORMAL LOW (ref 98–111)
Creatinine, Ser: 9.98 mg/dL — ABNORMAL HIGH (ref 0.44–1.00)
GFR calc Af Amer: 5 mL/min — ABNORMAL LOW (ref >60)
GFR calc non Af Amer: 4 mL/min — ABNORMAL LOW (ref >60)
Glucose, Bld: 128 mg/dL — ABNORMAL HIGH (ref 70–99)
Phosphorus: 4.4 mg/dL (ref 2.5–4.6)
Potassium: 3.8 mmol/L (ref 3.5–5.1)
Sodium: 133 mmol/L — ABNORMAL LOW (ref 135–145)

## 2019-01-23 LAB — CBC
HCT: 23.7 % — ABNORMAL LOW (ref 36.0–46.0)
Hemoglobin: 7.7 g/dL — ABNORMAL LOW (ref 12.0–15.0)
MCH: 32.1 pg (ref 26.0–34.0)
MCHC: 32.5 g/dL (ref 30.0–36.0)
MCV: 98.8 fL (ref 80.0–100.0)
Platelets: 265 10*3/uL (ref 150–400)
RBC: 2.4 MIL/uL — ABNORMAL LOW (ref 3.87–5.11)
RDW: 14.9 % (ref 11.5–15.5)
WBC: 10 10*3/uL (ref 4.0–10.5)
nRBC: 0 % (ref 0.0–0.2)

## 2019-01-23 MED ORDER — HYDROMORPHONE HCL 1 MG/ML IJ SOLN
INTRAMUSCULAR | Status: AC
  Administered 2019-01-23: 1 mg via INTRAVENOUS
  Filled 2019-01-23: qty 1

## 2019-01-23 NOTE — Progress Notes (Signed)
OT Cancellation Note  Patient Details Name: Rebekah Burton MRN: [id number] DOB: March 08, 1976   Cancelled Treatment:    Reason Eval/Treat Not Completed: Patient at procedure or test/ unavailable. HD  Malka So 01/23/2019, 8:18 AM  Nestor Lewandowsky, OTR/L Acute Rehabilitation Services Pager: [phone] Office: [phone]

## 2019-01-23 NOTE — Procedures (Signed)
I was present at this dialysis session. I have reviewed the session itself and made appropriate changes.   Seen on HD. 2.3L UF goal.  Tentative for SNF at this time.  Tol HD well.    Filed Weights   01/19/19 1236 01/21/19 0801 01/21/19 1145  Weight: 88.5 kg 91.5 kg 90.2 kg    Recent Labs  Lab 01/21/19 0706  NA 131*  K 3.6  CL 91*  CO2 22  GLUCOSE 135*  BUN 60*  CREATININE 10.82*  CALCIUM 8.7*  PHOS 4.9*    Recent Labs  Lab 01/16/19 2030  01/20/19 0842 01/21/19 0838 01/22/19 1028 01/23/19 0732  WBC 8.3   < > 13.1* 8.3 11.6* 10.0  NEUTROABS 6.2  --  10.5*  --   --   --   HGB 9.1*   < > 7.3* 5.9* 8.2* 7.7*  HCT 29.1*   < > 23.4* 18.9* 25.5* 23.7*  MCV 99.0   < > 98.7 100.0 97.3 98.8  PLT 285   < > 264 212 244 265   < > = values in this interval not displayed.    Scheduled Meds: . sodium chloride   Intravenous Once  . amLODipine  10 mg Oral Daily  . aspirin EC  81 mg Oral Daily  . carvedilol  25 mg Oral BID WC  . Chlorhexidine Gluconate Cloth  6 each Topical Q0600  . cloNIDine  0.1 mg Oral TID  . docusate sodium  100 mg Oral Daily  . feeding supplement (PRO-STAT SUGAR FREE 64)  30 mL Oral BID  . multivitamin  1 tablet Oral QHS  . mupirocin ointment  1 application Nasal BID  . pantoprazole  40 mg Oral Daily  . sevelamer carbonate  3,200 mg Oral TID WC  . sodium chloride flush  3 mL Intravenous Q12H   Continuous Infusions: . sodium chloride    . sodium chloride    . sodium chloride    . magnesium sulfate 1 - 4 g bolus IVPB     PRN Meds:.sodium chloride, sodium chloride, acetaminophen **OR** acetaminophen, alum & mag hydroxide-simeth, bisacodyl, fentaNYL (SUBLIMAZE) injection, guaiFENesin-dextromethorphan, hydrALAZINE, HYDROmorphone (DILAUDID) injection, labetalol, magnesium sulfate 1 - 4 g bolus IVPB, ondansetron (ZOFRAN) IV, ondansetron **OR** [DISCONTINUED] ondansetron (ZOFRAN) IV, oxyCODONE, phenol, potassium chloride, senna-docusate, sevelamer carbonate,  sodium chloride flush   Pearson Grippe  MD 01/23/2019, 8:55 AM

## 2019-01-23 NOTE — Progress Notes (Addendum)
  Progress Note    01/23/2019 9:05 AM 5 Days Post-Op  Subjective:  L foot and incisions all painful to light touch.  She is seen on HD unit this morning   Vitals:   01/22/19 2211 01/23/19 0625  BP: 122/67 107/64  Pulse:  78  Resp:  17  Temp:  98.2 F (36.8 C)  SpO2:  99%   Physical Exam: Lungs:  Non labored Incisions:  All incisions of LLE c/d/i Extremities:  L foot warm to touch; edema improved L foot; no active drainage L TMA Abdomen:  soft Neurologic: alert  CBC    Component Value Date/Time   WBC 10.0 01/23/2019 0732   RBC 2.40 (L) 01/23/2019 0732   HGB 7.7 (L) 01/23/2019 0732   HCT 23.7 (L) 01/23/2019 0732   PLT 265 01/23/2019 0732   MCV 98.8 01/23/2019 0732   MCH 32.1 01/23/2019 0732   MCHC 32.5 01/23/2019 0732   RDW 14.9 01/23/2019 0732   LYMPHSABS 1.0 01/20/2019 0842   MONOABS 1.2 (H) 01/20/2019 0842   EOSABS 0.1 01/20/2019 0842   BASOSABS 0.1 01/20/2019 0842    BMET    Component Value Date/Time   NA 131 (L) 01/21/2019 0706   K 3.6 01/21/2019 0706   CL 91 (L) 01/21/2019 0706   CO2 22 01/21/2019 0706   GLUCOSE 135 (H) 01/21/2019 0706   BUN 60 (H) 01/21/2019 0706   CREATININE 10.82 (H) 01/21/2019 0706   CALCIUM 8.7 (L) 01/21/2019 0706   GFRNONAA 4 (L) 01/21/2019 0706   GFRAA 5 (L) 01/21/2019 0706    INR No results found for: INR   Intake/Output Summary (Last 24 hours) at 01/23/2019 0905 Last data filed at 01/23/2019 3825 Gross per 24 hour  Intake 475 ml  Output -  Net 475 ml     Assessment/Plan:  43 y.o. female is s/p L fem-pop with vein and TMA 5 Days Post-Op   L foot warm to touch; L TMA incision stable Still painful to light touch entire LLE Dry gauze to L groin to wick moisture CSW working on SNF placement   Dagoberto Ligas, PA-C [facility] [phone] 01/23/2019 9:05 AM   I have examined the patient, reviewed and agree with above.  Curt Jews, MD 01/23/2019 3:24 PM

## 2019-01-23 NOTE — Progress Notes (Signed)
PT Cancellation Note  Patient Details Name: Rebekah Burton MRN: [id number] DOB: [birth year]   Cancelled Treatment:    Reason Eval/Treat Not Completed: Patient declined, no reason specified. Pt politely declining physical therapy session today due to fatigue from dialysis. States she is willing to participate tomorrow.  Ellamae Sia, PT, DPT Acute Rehabilitation Services Pager [phone] Office [phone]    Willy Eddy 01/23/2019, 2:40 PM

## 2019-01-23 NOTE — Progress Notes (Signed)
PROGRESS NOTE    Rebekah Burton  ZWC:[id number] DOB: September 30, 1976 DOA: 01/16/2019 PCP: Patient, [name]   Brief Narrative: Rebekah Burton is a 43 y.o. female with a history of PAD, ESRD on HD, hypertension, blindness. Patient presented secondary to foot pain. She has underwent bypass/amputation surgery for PAD/ischemic left foot ulcer with plan for discharge to rehab.   Assessment & Plan:   Principal Problem:   Ischemic ulcer of left foot ([place]) Active Problems:   ESRD (end stage renal disease) ([place])   HTN (hypertension)   Blind in both eyes   PAD (peripheral artery disease) ([facility])   Post-operative pain   Acute blood loss anemia   S/P transmetatarsal amputation of foot, left ([facility])   Ischemic ulcer of left foot Patient is s/p left metatarsal amputation. Continues to have significant tenderness on light palpation. Initial plan for CIR, but now recommended to discharge to SNF. -Vascular surgery recommendations: Heel weight bearing only, Darco shoe when weight bearing; rehab -Discontinue antibiotics since s/p amputation  ESRD on HD -Nephrology management  Essential hypertension -Continue amlodipine, Clonidine, Coreg -Continue to monitor blood pressure closely and adjust medications.  Pain Secondary to surgery -Continue as needed oxycodone  Blindness Chronic  Acute on chronic anemia Hemoglobin down to 5.9 s/p surgery. Given 2 units of PRBC with improvement.  Today hemoglobin 7.7. -Repeat CBC in AM   DVT prophylaxis: SCDs (consider switching to subcutaneous heparin due to risk of DVT if hemoglobin remained stable) Code Status:   Code Status: Full Code Family Communication: None at bedside Disposition Plan: Discharge to SNF when bed available   Consultants:   Vascular surgery  Nephrology  Procedures:   1/24: Left common femoral and profunda endarterectomy and vein patch angioplasty; Left femoral-below knee popliteal artery bypass; left transmetatarsal  amputation  Antimicrobials:  Cefazolin (1/24 >>1/28)   Subjective: Continues to have pain at amputation site.  Objective: Vitals:   01/23/19 0930 01/23/19 1000 01/23/19 1030 01/23/19 1203  BP: (!) 98/57 (!) 98/58 112/62 (!) 123/45  Pulse: 76 76 79 87  Resp:    18  Temp:    98 F (36.7 C)  TempSrc:    Oral  SpO2:    99%  Weight:      Height:        Intake/Output Summary (Last 24 hours) at 01/23/2019 1443 Last data filed at 01/23/2019 0634 Gross per 24 hour  Intake 355 ml  Output -  Net 355 ml   Filed Weights   01/21/19 0801 01/21/19 1145 01/23/19 0710  Weight: 91.5 kg 90.2 kg 92.3 kg    Examination:  General exam: Appears calm and comfortable Respiratory system: Clear to auscultation. Respiratory effort normal. Cardiovascular system: S1 & S2 heard, RRR. Gastrointestinal system: Abdomen is nondistended, soft and nontender. No organomegaly or masses felt. Normal bowel sounds heard. Central nervous system: Blind, no other focal deficits Extremities: No edema. No calf tenderness. Right transmetatarsal amputation with sutures in place, has tenderness at the amputation site Skin: No cyanosis. No rashes Psychiatry: Judgement and insight appear normal. Mood & affect appropriate.     Data Reviewed: I have personally reviewed following labs and imaging studies  CBC: Recent Labs  Lab 01/16/19 2030  01/19/19 1002 01/20/19 0842 01/21/19 0838 01/22/19 1028 01/23/19 0732  WBC 8.3   < > 12.7* 13.1* 8.3 11.6* 10.0  NEUTROABS 6.2  --   --  10.5*  --   --   --   HGB 9.1*   < >  7.0* 7.3* 5.9* 8.2* 7.7*  HCT 29.1*   < > 22.8* 23.4* 18.9* 25.5* 23.7*  MCV 99.0   < > 98.3 98.7 100.0 97.3 98.8  PLT 285   < > 297 264 212 244 265   < > = values in this interval not displayed.   Basic Metabolic Panel: Recent Labs  Lab 01/17/19 0818 01/18/19 0324 01/18/19 2352 01/19/19 1003 01/20/19 0842 01/21/19 0706 01/23/19 0732  NA 138 136 133* 131* 134* 131* 133*  K 3.6 3.8 4.5  3.9 3.5 3.6 3.8  CL 98 100 93* 93* 92* 91* 96*  CO2 27 24 23 23 27 22 22   GLUCOSE 94 94 132* 138* 113* 135* 128*  BUN 19 35* 49* 56* 36* 60* 63*  CREATININE 6.93* 9.23* 10.98* 11.79* 8.10* 10.82* 9.98*  CALCIUM 9.6 9.5 9.1 9.1 9.1 8.7* 9.3  PHOS 4.0 5.2*  --  5.7*  --  4.9* 4.4   GFR: Estimated Creatinine Clearance: 8.2 mL/min (A) (by C-G formula based on SCr of 9.98 mg/dL (H)). Liver Function Tests: Recent Labs  Lab 01/17/19 0818 01/18/19 0324 01/19/19 1003 01/21/19 0706 01/23/19 0732  ALBUMIN 3.5 3.1* 2.8* 2.5* 2.2*   No results for input(s): LIPASE, AMYLASE in the last 168 hours. No results for input(s): AMMONIA in the last 168 hours. Coagulation Profile: No results for input(s): INR, PROTIME in the last 168 hours. Cardiac Enzymes: No results for input(s): CKTOTAL, CKMB, CKMBINDEX, TROPONINI in the last 168 hours. BNP (last 3 results) No results for input(s): PROBNP in the last 8760 hours. HbA1C: No results for input(s): HGBA1C in the last 72 hours. CBG: No results for input(s): GLUCAP in the last 168 hours. Lipid Profile: No results for input(s): CHOL, HDL, LDLCALC, TRIG, CHOLHDL, LDLDIRECT in the last 72 hours. Thyroid Function Tests: No results for input(s): TSH, T4TOTAL, FREET4, T3FREE, THYROIDAB in the last 72 hours. Anemia Panel: No results for input(s): VITAMINB12, FOLATE, FERRITIN, TIBC, IRON, RETICCTPCT in the last 72 hours. Sepsis Labs: No results for input(s): PROCALCITON, LATICACIDVEN in the last 168 hours.  Recent Results (from the past 240 hour(s))  Aerobic Culture (superficial specimen)     Status: None   Collection Time: 01/15/19 12:30 PM  Result Value Ref Range Status   Specimen Description   Final    FOOT LEFT Performed at [facility] [address], [place], [place] [address]    Special Requests   Final    NONE Performed at [facility], [place] [address] [place], [place] [address]    Gram Stain   Final     RARE WBC PRESENT, PREDOMINANTLY MONONUCLEAR MODERATE GRAM POSITIVE COCCI ABUNDANT GRAM NEGATIVE RODS Performed at [facility], [place] [address], [place], [place] [address]    Culture   Final    ABUNDANT ESCHERICHIA COLI ABUNDANT PSEUDOMONAS AERUGINOSA    Report Status 01/19/2019 FINAL  Final   Organism ID, Bacteria ESCHERICHIA COLI  Final   Organism ID, Bacteria PSEUDOMONAS AERUGINOSA  Final      Susceptibility   Escherichia coli - MIC*    AMPICILLIN >=32 RESISTANT Resistant     CEFAZOLIN 16 SENSITIVE Sensitive     CEFEPIME <=1 SENSITIVE Sensitive     CEFTAZIDIME <=1 SENSITIVE Sensitive     CEFTRIAXONE <=1 SENSITIVE Sensitive     CIPROFLOXACIN <=0.25 SENSITIVE Sensitive     GENTAMICIN <=1 SENSITIVE Sensitive     IMIPENEM <=0.25 SENSITIVE Sensitive     TRIMETH/SULFA <=20 SENSITIVE  Sensitive     AMPICILLIN/SULBACTAM >=32 RESISTANT Resistant     PIP/TAZO 8 SENSITIVE Sensitive     Extended ESBL NEGATIVE Sensitive     * ABUNDANT ESCHERICHIA COLI   Pseudomonas aeruginosa - MIC*    CEFTAZIDIME 4 SENSITIVE Sensitive     CIPROFLOXACIN <=0.25 SENSITIVE Sensitive     GENTAMICIN <=1 SENSITIVE Sensitive     IMIPENEM 2 SENSITIVE Sensitive     PIP/TAZO 16 SENSITIVE Sensitive     CEFEPIME 4 SENSITIVE Sensitive     * ABUNDANT PSEUDOMONAS AERUGINOSA  Blood Cultures x 2 sites     Status: None   Collection Time: 01/16/19 11:15 PM  Result Value Ref Range Status   Specimen Description BLOOD RIGHT ANTECUBITAL  Final   Special Requests   Final    BOTTLES DRAWN AEROBIC ONLY Blood Culture results may not be optimal due to an inadequate volume of blood received in culture bottles   Culture   Final    NO GROWTH 5 DAYS Performed at [facility], [place] [address], [place], [place] [address]    Report Status 01/22/2019 FINAL  Final  Blood Cultures x 2 sites     Status: None   Collection Time: 01/16/19 11:23 PM  Result Value Ref Range Status   Specimen Description BLOOD RIGHT HAND   Final   Special Requests   Final    BOTTLES DRAWN AEROBIC AND ANAEROBIC Blood Culture adequate volume   Culture   Final    NO GROWTH 5 DAYS Performed at [facility], [place] [address], [place], [place] [address]    Report Status 01/22/2019 FINAL  Final  Surgical pcr screen     Status: Abnormal   Collection Time: 01/18/19  8:56 AM  Result Value Ref Range Status   MRSA, PCR POSITIVE (A) NEGATIVE Final    Comment: RESULT CALLED TO, READ BACK BY AND VERIFIED WITH: Brooke Pace RN 11:15 01/18/19 ([email or web address])    Staphylococcus aureus POSITIVE (A) NEGATIVE Final    Comment: (NOTE) The Xpert SA Assay (FDA approved for NASAL specimens in patients 69 years of age and older), is one component of a comprehensive surveillance program. It is not intended to diagnose infection nor to guide or monitor treatment. Performed at [facility], [place] [address] [place], [place] [address]          Radiology Studies: No results found.      Scheduled Meds: . sodium chloride   Intravenous Once  . amLODipine  10 mg Oral Daily  . aspirin EC  81 mg Oral Daily  . carvedilol  25 mg Oral BID WC  . Chlorhexidine Gluconate Cloth  6 each Topical Q0600  . cloNIDine  0.1 mg Oral TID  . docusate sodium  100 mg Oral Daily  . feeding supplement (PRO-STAT SUGAR FREE 64)  30 mL Oral BID  . multivitamin  1 tablet Oral QHS  . mupirocin ointment  1 application Nasal BID  . pantoprazole  40 mg Oral Daily  . sevelamer carbonate  3,200 mg Oral TID WC  . sodium chloride flush  3 mL Intravenous Q12H   Continuous Infusions: . sodium chloride    . sodium chloride    . sodium chloride    . magnesium sulfate 1 - 4 g bolus IVPB       LOS: 7 days     Yaakov Guthrie, MD [facility] 01/23/2019, 2:43 PM  If 7PM-7AM, please contact night-coverage [email or web address]

## 2019-01-23 NOTE — NC FL2 (Addendum)
[place] MEDICAID FL2 LEVEL OF CARE SCREENING TOOL     IDENTIFICATION  Patient Name: Rebekah Burton Birthdate: 24-Nov-1976 Sex: female Admission Date (Current Location): 01/16/2019  [facility] and [place] Number:  Herbalist and Address:  [facility] [place] [address], [place], [place] [address]      Provider Number: [id number]  Attending Physician Name and Address:  Yaakov Guthrie, MD  Relative Name and Phone Number:  Nolen Mu [phone]    Current Level of Care: Hospital Recommended Level of Care: [place] Prior Approval Number:    Date Approved/Denied:   PASRR Number:  [id number]   Discharge Plan: SNF    Current Diagnoses: Patient Active Problem List   Diagnosis Date Noted  . Post-operative pain   . Acute blood loss anemia   . S/P transmetatarsal amputation of foot, left ([place])   . Ischemic ulcer of left foot ([place]) 01/16/2019  . ESRD (end stage renal disease) ([place]) 01/16/2019  . HTN (hypertension) 01/16/2019  . 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  . PAD (peripheral artery disease) ([place]) 01/16/2019    Orientation RESPIRATION BLADDER Height & Weight     Self, Time, Situation, Place  Normal Continent Weight: 197 lb 5 oz (89.5 kg) Height:  5\' 4"  (162.6 cm)  BEHAVIORAL SYMPTOMS/MOOD NEUROLOGICAL BOWEL NUTRITION STATUS      Continent Diet(please see discharge summary )  AMBULATORY STATUS COMMUNICATION OF NEEDS Skin     Verbally Surgical wounds(incision(closed) groin, left, incision(closed) left  leg, incison(closed) left foot )                       Personal Care Assistance Level of Assistance  Bathing, Feeding, Dressing Bathing Assistance: Limited assistance Feeding assistance: Independent Dressing Assistance: Limited assistance     Functional Limitations Info  Sight, Hearing, Speech Sight Info: Impaired- patient is blind Hearing Info: Adequate Speech Info: Adequate    SPECIAL CARE FACTORS FREQUENCY   PT (By licensed PT), OT (By licensed OT)     PT Frequency: 3x per week OT Frequency: 3x per week             Contractures Contractures Info: Not present    Additional Factors Info  Code Status, Allergies, Isolation Precautions Code Status Info: FULL Allergies Info: Percocet oxycodone-acetaminophen     Isolation Precautions Info: MRSA     Current Medications (01/23/2019):  This is the current hospital active medication list Current Facility-Administered Medications  Medication Dose Route Frequency Provider Last Rate Last Dose  . 0.9 %  sodium chloride infusion (Manually program via Guardrails IV Fluids)   Intravenous Once Pearson Grippe B, MD      . 0.9 %  sodium chloride infusion  250 mL Intravenous PRN Laurence Slate M, PA-C      . 0.9 %  sodium chloride infusion  500 mL Intravenous Once PRN Laurence Slate M, PA-C      . 0.9 %  sodium chloride infusion   Intravenous Continuous Collins, Emma M, PA-C      . acetaminophen (TYLENOL) tablet 650 mg  650 mg Oral Q6H PRN Laurence Slate M, PA-C       Or  . acetaminophen (TYLENOL) suppository 650 mg  650 mg Rectal Q6H PRN Ulyses Amor, PA-C      . alum & mag hydroxide-simeth (MAALOX/MYLANTA) 200-200-20 MG/5ML suspension 15-30 mL  15-30 mL Oral Q2H PRN Laurence Slate M, PA-C      . amLODipine (  NORVASC) tablet 10 mg  10 mg Oral Daily Laurence Slate M, PA-C   10 mg at 01/23/19 1232  . aspirin EC tablet 81 mg  81 mg Oral Daily Waynetta Sandy, MD   81 mg at 01/23/19 1232  . bisacodyl (DULCOLAX) EC tablet 5 mg  5 mg Oral Daily PRN Laurence Slate M, PA-C      . carvedilol (COREG) tablet 25 mg  25 mg Oral BID WC Laurence Slate M, PA-C   25 mg at 01/22/19 1652  . Chlorhexidine Gluconate Cloth 2 % PADS 6 each  6 each Topical Q0600 Elmarie Shiley, MD   6 each at 01/23/19 [phone]  . cloNIDine (CATAPRES) tablet 0.1 mg  0.1 mg Oral TID Pearson Grippe B, MD   0.1 mg at 01/23/19 1233  . docusate sodium (COLACE) capsule 100 mg  100 mg Oral Daily Laurence Slate M, PA-C   100 mg at 01/23/19 1233  . feeding supplement (PRO-STAT SUGAR FREE 64) liquid 30 mL  30 mL Oral BID Laurence Slate M, PA-C   30 mL at 01/23/19 1233  . fentaNYL (SUBLIMAZE) injection 50 mcg  50 mcg Intravenous Q2H PRN Ulyses Amor, PA-C   50 mcg at 01/18/19 9518  . guaiFENesin-dextromethorphan (ROBITUSSIN DM) 100-10 MG/5ML syrup 15 mL  15 mL Oral Q4H PRN Laurence Slate M, PA-C      . hydrALAZINE (APRESOLINE) injection 5 mg  5 mg Intravenous Q20 Min PRN Laurence Slate M, PA-C      . HYDROmorphone (DILAUDID) injection 0.5-1 mg  0.5-1 mg Intravenous Q2H PRN Laurence Slate M, PA-C   1 mg at 01/23/19 1233  . labetalol (NORMODYNE,TRANDATE) injection 10 mg  10 mg Intravenous Q10 min PRN Laurence Slate M, PA-C      . magnesium sulfate IVPB 2 g 50 mL  2 g Intravenous Daily PRN Laurence Slate M, PA-C      . multivitamin (RENA-VIT) tablet 1 tablet  1 tablet Oral QHS Ulyses Amor, PA-C   1 tablet at 01/22/19 2211  . mupirocin ointment (BACTROBAN) 2 % 1 application  1 application Nasal BID Cristal Deer, MD   1 application at 84/16/60 1203  . ondansetron (ZOFRAN) injection 4 mg  4 mg Intravenous Q6H PRN Laurence Slate M, PA-C      . ondansetron [facility]) tablet 4 mg  4 mg Oral Q6H PRN Laurence Slate M, PA-C      . oxyCODONE (Oxy IR/ROXICODONE) immediate release tablet 5-10 mg  5-10 mg Oral Q4H PRN Laurence Slate M, PA-C   10 mg at 01/23/19 1233  . pantoprazole (PROTONIX) EC tablet 40 mg  40 mg Oral Daily Laurence Slate M, PA-C   40 mg at 01/23/19 1232  . phenol (CHLORASEPTIC) mouth spray 1 spray  1 spray Mouth/Throat PRN Laurence Slate M, PA-C      . potassium chloride SA (K-DUR,KLOR-CON) CR tablet 20-40 mEq  20-40 mEq Oral Daily PRN Laurence Slate M, PA-C      . senna-docusate (Senokot-S) tablet 1 tablet  1 tablet Oral QHS PRN Laurence Slate M, PA-C      . sevelamer carbonate (RENVELA) tablet 1,600 mg  1,600 mg Oral PRN Laurence Slate M, PA-C      . sevelamer carbonate (RENVELA) tablet 3,200 mg  3,200  mg Oral TID WC Laurence Slate M, PA-C   3,200 mg at 01/23/19 1417  . sodium chloride flush (NS) 0.9 % injection 3 mL  3 mL Intravenous Q12H Ulyses Amor,  PA-C   3 mL at 01/22/19 2214  . sodium chloride flush (NS) 0.9 % injection 3 mL  3 mL Intravenous PRN Ulyses Amor, PA-C         Discharge Medications: Please see discharge summary for a list of discharge medications.  Relevant Imaging Results:  Relevant Lab Results:   Additional Information SSN #[id number]  Vinie Sill, LCSWA

## 2019-01-23 NOTE — Clinical Social Work Note (Signed)
Clinical Social Work Assessment  Patient Details  Name: Rebekah Burton MRN: [id number] Date of Birth: 1976/07/12  Date of referral:  01/23/19               Reason for consult:  Facility Placement                Permission sought to share information with:  Facility Art therapist granted to share information::  Yes, Verbal Permission Granted  Name::     Nolen Mu   Agency::  SNFs  Relationship::  Aunt   Contact Information:  [phone]  Housing/Transportation Living arrangements for the past 2 months:  Apartment Source of Information:  Patient Patient Interpreter Needed:  None Criminal Activity/Legal Involvement Pertinent to Current Situation/Hospitalization:  No - Comment as needed Significant Relationships:  Other Family Members Lives with:  Relatives Do you feel safe going back to the place where you live?  No Need for family participation in patient care:  Yes (Comment)  Care giving concerns:  CSW received consult for discharge needs. CSW spoke with patient regarding PT recommendation of SNF placement at time of discharge. Patient was alert and oriented. Patient states she lives in a apartment with her niece. She has to go up stairs leading to the apartment and then two flights of stairs to get to the apartment. Patient expressed she wants to get stronger and increase her mobility  before returning home.      Social Worker assessment / plan:  CSW spoke with patient concerning possibility of rehab at [facility] before returning home.  Patient states she receives dialysis M,W, F at 12:00pm at [place]. She utilizes [employer]  to and from dialysis. Transportation cost is paid for by [employer].    Employment status:  Disabled (Comment on whether or not currently receiving Disability) Insurance information:  Medicare PT Recommendations:  [place] / Referral to community resources:  [place]  Patient/Family's Response to care:  Patient recognizes need for rehab before returning home and is agreeable to a SNF placement. Patient does not have a preference at this time and location does not matter.   Patient/Family's Understanding of and Emotional Response to Diagnosis, Current Treatment, and Prognosis:  Patient is realistic regarding therapy needs and expressed being hopeful for SNF placement. Patient expressed understanding of CSW role and discharge process as well as medical condition. No questions/concerns about plan or treatment at this time.   Emotional Assessment Appearance:  Appears older than stated age Attitude/Demeanor/Rapport:  Engaged Affect (typically observed):  Appropriate, Accepting, Hopeful, Pleasant Orientation:  Oriented to Self, Oriented to Place, Oriented to  Time, Oriented to Situation Alcohol / Substance use:  Not Applicable Psych involvement (Current and /or in the community):  No (Comment)  Discharge Needs  Concerns to be addressed:  Care Coordination, Basic Needs Readmission within the last 30 days:  No Current discharge risk:  Dependent with Mobility Barriers to Discharge:  Continued Medical Work up   [employer], [place] 01/23/2019, 4:35 PM

## 2019-01-24 LAB — CBC
HCT: 24.5 % — ABNORMAL LOW (ref 36.0–46.0)
Hemoglobin: 7.9 g/dL — ABNORMAL LOW (ref 12.0–15.0)
MCH: 31.6 pg (ref 26.0–34.0)
MCHC: 32.2 g/dL (ref 30.0–36.0)
MCV: 98 fL (ref 80.0–100.0)
Platelets: 233 10*3/uL (ref 150–400)
RBC: 2.5 MIL/uL — ABNORMAL LOW (ref 3.87–5.11)
RDW: 14.8 % (ref 11.5–15.5)
WBC: 10.4 10*3/uL (ref 4.0–10.5)
nRBC: 0 % (ref 0.0–0.2)

## 2019-01-24 LAB — BASIC METABOLIC PANEL
Anion gap: 12 (ref 5–15)
BUN: 35 mg/dL — ABNORMAL HIGH (ref 6–20)
CO2: 27 mmol/L (ref 22–32)
Calcium: 8.9 mg/dL (ref 8.9–10.3)
Chloride: 98 mmol/L (ref 98–111)
Creatinine, Ser: 6.56 mg/dL — ABNORMAL HIGH (ref 0.44–1.00)
GFR calc Af Amer: 8 mL/min — ABNORMAL LOW (ref >60)
GFR calc non Af Amer: 7 mL/min — ABNORMAL LOW (ref >60)
Glucose, Bld: 88 mg/dL (ref 70–99)
Potassium: 3.6 mmol/L (ref 3.5–5.1)
Sodium: 137 mmol/L (ref 135–145)

## 2019-01-24 MED ORDER — CHLORHEXIDINE GLUCONATE CLOTH 2 % EX PADS: 6.0000 | MEDICATED_PAD | Freq: Every day | CUTANEOUS | Status: DC

## 2019-01-24 MED ORDER — AMLODIPINE BESYLATE 10 MG PO TABS
10.0000 mg | ORAL_TABLET | Freq: Every day | ORAL | Status: DC
  Administered 2019-01-25: 10 mg via ORAL
  Filled 2019-01-24: qty 1

## 2019-01-24 MED ORDER — DARBEPOETIN ALFA 200 MCG/0.4ML IJ SOSY: 200.0000 ug | PREFILLED_SYRINGE | INTRAMUSCULAR | Status: DC

## 2019-01-24 NOTE — Progress Notes (Signed)
Physical Therapy Treatment Patient Details Name: Rebekah Burton MRN: [id number] DOB: September 29, 1976 Today's Date: 01/24/2019    History of Present Illness 43 y.o. patient s/p L transmet amp and fem-pop bypass 1/14. PMH includes: R transmet amputation, Blindness, ESRD, HTN, PAD. Patient is WB through LLE heel with Darco shoe only per MD.     PT Comments    Pt progressing towards her physical therapy goals, requiring less assistance for lateral scoot transfers. Pt deferring standing or gait training and does continue with minimal sanguineous drainage in addition to reporting constant LLE pain. Rest of session focused on lower extremity strengthening. Updated d/c plan to SNF in light of CIR denial. Barrier to discharge home remains second floor apartment with stairs to enter.     Follow Up Recommendations  SNF     Equipment Recommendations  None recommended by PT    Recommendations for Other Services       Precautions / Restrictions Precautions Precautions: Fall Precaution Comments: Blind Required Braces or Orthoses: Other Brace Other Brace: L darco Restrictions Weight Bearing Restrictions: Yes LLE Weight Bearing: Partial weight bearing LLE Partial Weight Bearing Percentage or Pounds: (heel weightbearing through darco)    Mobility  Bed Mobility Overal bed mobility: Modified Independent             General bed mobility comments: HOB up  Transfers Overall transfer level: Needs assistance Equipment used: None Transfers: Lateral/Scoot Transfers Sit to Stand: Supervision         General transfer comment: Supervision for lateral scoot transfer towards right with recliner arm dropped. Provided sensory/auditory input due to visual deficits.   Ambulation/Gait                 Stairs             Wheelchair Mobility    Modified Rankin (Stroke Patients Only)       Balance Overall balance assessment: Needs assistance   Sitting balance-Leahy Scale: Good                                       Cognition Arousal/Alertness: Awake/alert Behavior During Therapy: WFL for tasks assessed/performed Overall Cognitive Status: Within Functional Limits for tasks assessed                                        Exercises General Exercises - Lower Extremity Long Arc Quad: 20 reps;Both;Seated Hip ABduction/ADduction: 20 reps;Both;Seated Hip Flexion/Marching: 20 reps;Both;Seated Other Exercises Other Exercises: educated on LLE elevation    General Comments        Pertinent Vitals/Pain Pain Assessment: Faces Faces Pain Scale: Hurts even more Pain Location: LLE Pain Descriptors / Indicators: Grimacing;Constant;Guarding Pain Intervention(s): Limited activity within patient's tolerance;Monitored during session    Home Living                      Prior Function            PT Goals (current goals can now be found in the care plan section) Acute Rehab PT Goals Patient Stated Goal: to do whatever yall tell me to get better Potential to Achieve Goals: Good Progress towards PT goals: Progressing toward goals    Frequency    Min 2X/week      PT Plan Discharge plan needs to  be updated;Frequency needs to be updated    Co-evaluation              AM-PAC PT "6 Clicks" Mobility   Outcome Measure  Help needed turning from your back to your side while in a flat bed without using bedrails?: None Help needed moving from lying on your back to sitting on the side of a flat bed without using bedrails?: None Help needed moving to and from a bed to a chair (including a wheelchair)?: A Little Help needed standing up from a chair using your arms (e.g., wheelchair or bedside chair)?: A Lot Help needed to walk in hospital room?: Total Help needed climbing 3-5 steps with a railing? : Total 6 Click Score: 15    End of Session   Activity Tolerance: Patient tolerated treatment well Patient left: in chair;with  call bell/phone within reach;with chair alarm set Nurse Communication: Mobility status PT Visit Diagnosis: Unsteadiness on feet (R26.81)     Time: 4193-7902 PT Time Calculation (min) (ACUTE ONLY): 19 min  Charges:  $Therapeutic Exercise: 8-22 mins                    Ellamae Sia, PT, DPT Acute Rehabilitation Services Pager [phone] Office [phone]    Rebekah Burton 01/24/2019, 9:10 AM

## 2019-01-24 NOTE — Progress Notes (Signed)
[facility] Progress Note   Dialysis Orders: Monday/Wednesday/Friday, [facility], 4 hours 15 minutes, EDW 90 kg, BFR 400, DFR 800, 2K/2.5 calcium, no UF profile/sodium modeling. Mircera 100 mcg every 2 weeks (last dose 1/22), Hectorol 5 mcg q. dialysis and Parsabiv 7.5 mg q. dialysis. Left BCF  Problem/Plan: 1. 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 Dr Donnetta Hutching.  For CIR admission soon. 2. End-stage renal disease: On hemodialysis MWF , Ks in 3 - use 3 K bath 4h No heparin 2-3L UF.   3   Hypertension/ Volume :HD Monday post wt 88.5  Below edw, BPs hve been soft, reduced clonidine - hold for SBP < 120 net UF 2.3 Wed to 89.5 - may be able to d/c clonidine fully - has LLE edema- needs a little more volume down 4. Anemia:  S/p 2u pRBC 1/27 for Hb 5.9.  hgb 7.9 1/30 . Rec outpt Mircera 1/22 - redose next week -  5 Secondary hyperparathyroidism: Cont binders, but has been off hectorol. She is usually on [place] which is unavailable here./ out pt bath 2.5 ca  - corr Ca ~10 due to low alb and off parsabiv - use 2.25 Ca bath P ok 6. Nutrition - ok to have heart healthy diet- added fluid restriction + multivit/prostat for low alb  Rebekah Jacobson, PA-C [facility] Beeper [phone] 01/24/2019,10:21 AM  LOS: 8 days   Subjective:   No problems with dialysis yesterday -   Objective Vitals:   01/23/19 1944 01/23/19 2145 01/24/19 0007 01/24/19 0747  BP: 114/65 104/77 (!) 103/51 (!) 93/42  Pulse: 86  82   Resp: 20  16 13   Temp: 98.4 F (36.9 C)  98.3 F (36.8 C)   TempSrc: Oral  Oral   SpO2: 95%  94%   Weight:      Height:       Physical Exam General: NAD sitting in bed Heart: RRR Lungs: no rales Abdomen: obese soft  Extremities: no RLE edema 2+ LLE transmet edema  Dialysis Access: left AVF + bruit   Additional Objective Labs: Basic Metabolic Panel: Recent Labs   Lab 01/19/19 1003  01/21/19 0706 01/23/19 0732 01/24/19 0301  NA 131*   < > 131* 133* 137  K 3.9   < > 3.6 3.8 3.6  CL 93*   < > 91* 96* 98  CO2 23   < > 22 22 27   GLUCOSE 138*   < > 135* 128* 88  BUN 56*   < > 60* 63* 35*  CREATININE 11.79*   < > 10.82* 9.98* 6.56*  CALCIUM 9.1   < > 8.7* 9.3 8.9  PHOS 5.7*  --  4.9* 4.4  --    < > = values in this interval not displayed.   Liver Function Tests: Recent Labs  Lab 01/19/19 1003 01/21/19 0706 01/23/19 0732  ALBUMIN 2.8* 2.5* 2.2*   No results for input(s): LIPASE, AMYLASE in the last 168 hours. CBC: Recent Labs  Lab 01/20/19 0842 01/21/19 0838 01/22/19 1028 01/23/19 0732 01/24/19 0301  WBC 13.1* 8.3 11.6* 10.0 10.4  NEUTROABS 10.5*  --   --   --   --   HGB 7.3* 5.9* 8.2* 7.7* 7.9*  HCT 23.4* 18.9* 25.5* 23.7* 24.5*  MCV 98.7 100.0 97.3 98.8 98.0  PLT 264 212 244 265 233   Blood Culture    Component Value Date/Time  SDES BLOOD RIGHT HAND 01/16/2019 2323   SPECREQUEST  01/16/2019 2323    BOTTLES DRAWN AEROBIC AND ANAEROBIC Blood Culture adequate volume   CULT  01/16/2019 2323    NO GROWTH 5 DAYS Performed at [facility], [place] [address], [place], [place] [address]    REPTSTATUS 01/22/2019 FINAL 01/16/2019 2323    Cardiac Enzymes: No results for input(s): CKTOTAL, CKMB, CKMBINDEX, TROPONINI in the last 168 hours. CBG: No results for input(s): GLUCAP in the last 168 hours. Iron Studies: No results for input(s): IRON, TIBC, TRANSFERRIN, FERRITIN in the last 72 hours. No results found for: INR, PROTIME Studies/Results: No results found. Medications: . sodium chloride    . sodium chloride    . sodium chloride    . magnesium sulfate 1 - 4 g bolus IVPB     . sodium chloride   Intravenous Once  . amLODipine  10 mg Oral Daily  . aspirin EC  81 mg Oral Daily  . carvedilol  25 mg Oral BID WC  . Chlorhexidine Gluconate Cloth  6 each Topical Q0600  . cloNIDine  0.1 mg Oral TID  . docusate sodium  100  mg Oral Daily  . feeding supplement (PRO-STAT SUGAR FREE 64)  30 mL Oral BID  . multivitamin  1 tablet Oral QHS  . pantoprazole  40 mg Oral Daily  . sevelamer carbonate  3,200 mg Oral TID WC  . sodium chloride flush  3 mL Intravenous Q12H

## 2019-01-24 NOTE — Progress Notes (Signed)
Patient ID: Rebekah Burton, female   DOB: April 03, 1976, 43 y.o.   MRN: [id number] Still reports incisional pain and pain in her left foot.  Feels that this may be some better.  All vein harvest incisions are healing nicely.  Does have some slight blistering on the dorsal aspect of her transmetatarsal incision.  Biphasic triphasic dorsalis pedis signal by Doppler.  We will continue to elevate her legs when she is not walking.  Otherwise continue to progress.  Skilled nursing facility when bed available

## 2019-01-24 NOTE — Progress Notes (Signed)
PROGRESS NOTE    Rebekah Burton  GUR:[id number] DOB: [birth year] DOA: 01/16/2019 PCP: Patient, [name]   Brief Narrative: Rebekah Burton is a 43 y.o. female with a history of PAD, ESRD on HD, hypertension, blindness. Patient presented secondary to foot pain. She has underwent bypass/amputation surgery for PAD/ischemic left foot ulcer with plan for discharge to rehab.   Assessment & Plan:   Principal Problem:   Ischemic ulcer of left foot ([place]) Active Problems:   ESRD (end stage renal disease) ([place])   HTN (hypertension)   Blind in both eyes   PAD (peripheral artery disease) ([facility])   Post-operative pain   Acute blood loss anemia   S/P transmetatarsal amputation of foot, left ([facility])   Ischemic ulcer of left foot Patient is s/p left metatarsal amputation. Continues to have significant tenderness on light palpation. Initial plan for CIR, but now recommended to discharge to SNF. -Vascular surgery recommendations: Heel weight bearing only, Darco shoe when weight bearing; rehab -Discontinued antibiotics since s/p amputation -Pain management with opiates  ESRD on HD -Nephrology management  Essential hypertension -Continue amlodipine, Clonidine, Coreg  Blindness Chronic  Acute on chronic anemia Hemoglobin down to 5.9 s/p surgery. Given 2 units of PRBC with improvement.  Today hemoglobin 7.9. Continue to monitor periodically.   DVT prophylaxis: SCDs (consider switching to subcutaneous heparin due to risk of DVT if hemoglobin remained stable) Code Status:   Code Status: Full Code Family Communication: None at bedside Disposition Plan: Discharge to SNF when bed available   Consultants:   Vascular surgery  Nephrology  Procedures:   1/24: Left common femoral and profunda endarterectomy and vein patch angioplasty; Left femoral-below knee popliteal artery bypass; left transmetatarsal amputation  Antimicrobials:  Cefazolin (1/24 >>1/28)   Subjective: Continues to have  pain at amputation site.  States she was told by surgical service to keep wound open and dry.  Objective: Vitals:   01/23/19 2145 01/24/19 0007 01/24/19 0747 01/24/19 1656  BP: 104/77 (!) 103/51 (!) 93/42 131/62  Pulse:  82    Resp:  16 13   Temp:  98.3 F (36.8 C)    TempSrc:  Oral    SpO2:  94%    Weight:      Height:       No intake or output data in the 24 hours ending 01/24/19 1751 Filed Weights   01/21/19 1145 01/23/19 0710 01/23/19 1113  Weight: 90.2 kg 92.3 kg 89.5 kg    Examination:  General exam: Appears calm and comfortable Respiratory system: Clear to auscultation. Respiratory effort normal. Cardiovascular system: S1 & S2 heard, RRR. Gastrointestinal system: Abdomen is nondistended, soft and nontender. No organomegaly or masses felt. Normal bowel sounds heard. Central nervous system: Blind, no other focal deficits Extremities: No edema. No calf tenderness. Right transmetatarsal amputation with sutures in place, has tenderness at the amputation site.  Also has surgical wound along medial aspect of left lower leg from recent bypass no signs of infection. Skin: No cyanosis. No rashes Psychiatry: Judgement and insight appear normal. Mood & affect appropriate.     Data Reviewed: I have personally reviewed following labs and imaging studies  CBC: Recent Labs  Lab 01/20/19 0842 01/21/19 0838 01/22/19 1028 01/23/19 0732 01/24/19 0301  WBC 13.1* 8.3 11.6* 10.0 10.4  NEUTROABS 10.5*  --   --   --   --   HGB 7.3* 5.9* 8.2* 7.7* 7.9*  HCT 23.4* 18.9* 25.5* 23.7* 24.5*  MCV 98.7 100.0 97.3 98.8 98.0  PLT 264 212 244 265 160   Basic Metabolic Panel: Recent Labs  Lab 01/18/19 0324  01/19/19 1003 01/20/19 0842 01/21/19 0706 01/23/19 0732 01/24/19 0301  NA 136   < > 131* 134* 131* 133* 137  K 3.8   < > 3.9 3.5 3.6 3.8 3.6  CL 100   < > 93* 92* 91* 96* 98  CO2 24   < > 23 27 22 22 27   GLUCOSE 94   < > 138* 113* 135* 128* 88  BUN 35*   < > 56* 36* 60* 63* 35*   CREATININE 9.23*   < > 11.79* 8.10* 10.82* 9.98* 6.56*  CALCIUM 9.5   < > 9.1 9.1 8.7* 9.3 8.9  PHOS 5.2*  --  5.7*  --  4.9* 4.4  --    < > = values in this interval not displayed.   GFR: Estimated Creatinine Clearance: 12.3 mL/min (A) (by C-G formula based on SCr of 6.56 mg/dL (H)). Liver Function Tests: Recent Labs  Lab 01/18/19 0324 01/19/19 1003 01/21/19 0706 01/23/19 0732  ALBUMIN 3.1* 2.8* 2.5* 2.2*   No results for input(s): LIPASE, AMYLASE in the last 168 hours. No results for input(s): AMMONIA in the last 168 hours. Coagulation Profile: No results for input(s): INR, PROTIME in the last 168 hours. Cardiac Enzymes: No results for input(s): CKTOTAL, CKMB, CKMBINDEX, TROPONINI in the last 168 hours. BNP (last 3 results) No results for input(s): PROBNP in the last 8760 hours. HbA1C: No results for input(s): HGBA1C in the last 72 hours. CBG: No results for input(s): GLUCAP in the last 168 hours. Lipid Profile: No results for input(s): CHOL, HDL, LDLCALC, TRIG, CHOLHDL, LDLDIRECT in the last 72 hours. Thyroid Function Tests: No results for input(s): TSH, T4TOTAL, FREET4, T3FREE, THYROIDAB in the last 72 hours. Anemia Panel: No results for input(s): VITAMINB12, FOLATE, FERRITIN, TIBC, IRON, RETICCTPCT in the last 72 hours. Sepsis Labs: No results for input(s): PROCALCITON, LATICACIDVEN in the last 168 hours.  Recent Results (from the past 240 hour(s))  Aerobic Culture (superficial specimen)     Status: None   Collection Time: 01/15/19 12:30 PM  Result Value Ref Range Status   Specimen Description   Final    FOOT LEFT Performed at [facility] [address] [place], [place] [address]    Special Requests   Final    NONE Performed at [facility], [place] [address], [place], [place] [address]    Gram Stain   Final    RARE WBC PRESENT, PREDOMINANTLY MONONUCLEAR MODERATE GRAM POSITIVE COCCI ABUNDANT GRAM NEGATIVE  RODS Performed at [facility], [place] [address], [place], [place] [address]    Culture   Final    ABUNDANT ESCHERICHIA COLI ABUNDANT PSEUDOMONAS AERUGINOSA    Report Status 01/19/2019 FINAL  Final   Organism ID, Bacteria ESCHERICHIA COLI  Final   Organism ID, Bacteria PSEUDOMONAS AERUGINOSA  Final      Susceptibility   Escherichia coli - MIC*    AMPICILLIN >=32 RESISTANT Resistant     CEFAZOLIN 16 SENSITIVE Sensitive     CEFEPIME <=1 SENSITIVE Sensitive     CEFTAZIDIME <=1 SENSITIVE Sensitive     CEFTRIAXONE <=1 SENSITIVE Sensitive     CIPROFLOXACIN <=0.25 SENSITIVE Sensitive     GENTAMICIN <=1 SENSITIVE Sensitive     IMIPENEM <=0.25 SENSITIVE Sensitive     TRIMETH/SULFA <=20 SENSITIVE Sensitive     AMPICILLIN/SULBACTAM >=32 RESISTANT Resistant  PIP/TAZO 8 SENSITIVE Sensitive     Extended ESBL NEGATIVE Sensitive     * ABUNDANT ESCHERICHIA COLI   Pseudomonas aeruginosa - MIC*    CEFTAZIDIME 4 SENSITIVE Sensitive     CIPROFLOXACIN <=0.25 SENSITIVE Sensitive     GENTAMICIN <=1 SENSITIVE Sensitive     IMIPENEM 2 SENSITIVE Sensitive     PIP/TAZO 16 SENSITIVE Sensitive     CEFEPIME 4 SENSITIVE Sensitive     * ABUNDANT PSEUDOMONAS AERUGINOSA  Blood Cultures x 2 sites     Status: None   Collection Time: 01/16/19 11:15 PM  Result Value Ref Range Status   Specimen Description BLOOD RIGHT ANTECUBITAL  Final   Special Requests   Final    BOTTLES DRAWN AEROBIC ONLY Blood Culture results may not be optimal due to an inadequate volume of blood received in culture bottles   Culture   Final    NO GROWTH 5 DAYS Performed at [facility], [place] [address], [place], [place] [address]    Report Status 01/22/2019 FINAL  Final  Blood Cultures x 2 sites     Status: None   Collection Time: 01/16/19 11:23 PM  Result Value Ref Range Status   Specimen Description BLOOD RIGHT HAND  Final   Special Requests   Final    BOTTLES DRAWN AEROBIC AND ANAEROBIC Blood Culture adequate volume    Culture   Final    NO GROWTH 5 DAYS Performed at [facility], [place] [address], [place], [place] [address]    Report Status 01/22/2019 FINAL  Final  Surgical pcr screen     Status: Abnormal   Collection Time: 01/18/19  8:56 AM  Result Value Ref Range Status   MRSA, PCR POSITIVE (A) NEGATIVE Final    Comment: RESULT CALLED TO, READ BACK BY AND VERIFIED WITH: Brooke Pace RN 11:15 01/18/19 ([email or web address])    Staphylococcus aureus POSITIVE (A) NEGATIVE Final    Comment: (NOTE) The Xpert SA Assay (FDA approved for NASAL specimens in patients 74 years of age and older), is one component of a comprehensive surveillance program. It is not intended to diagnose infection nor to guide or monitor treatment. Performed at [facility], [place] [address] [place],  [address]          Radiology Studies: No results found.      Scheduled Meds: . sodium chloride   Intravenous Once  . [START ON 01/25/2019] amLODipine  10 mg Oral QHS  . aspirin EC  81 mg Oral Daily  . carvedilol  25 mg Oral BID WC  . Chlorhexidine Gluconate Cloth  6 each Topical Q0600  . cloNIDine  0.1 mg Oral TID  . [START ON 01/30/2019] darbepoetin (ARANESP) injection - DIALYSIS  200 mcg Intravenous Q Wed-HD  . docusate sodium  100 mg Oral Daily  . feeding supplement (PRO-STAT SUGAR FREE 64)  30 mL Oral BID  . multivitamin  1 tablet Oral QHS  . pantoprazole  40 mg Oral Daily  . sevelamer carbonate  3,200 mg Oral TID WC  . sodium chloride flush  3 mL Intravenous Q12H   Continuous Infusions: . sodium chloride    . sodium chloride    . sodium chloride    . magnesium sulfate 1 - 4 g bolus IVPB       LOS: 8 days     Yaakov Guthrie, MD [facility] 01/24/2019, 5:51 PM  If 7PM-7AM, please contact night-coverage [email or web address]

## 2019-01-24 NOTE — Progress Notes (Signed)
CSW met with the patient at bedside to discuss SNF palcement and transportation needs for SNF. Patient was alert and oriented. Patient selected [facility] do not provide transportation to dialysis. Transportation must be arranged before the patient can ne discharged to SNF.  Patient approved of CSW completing [employer] application for transportation needs at the SNF.  CSW faxed [employer] application today.  Thurmond Butts, [place] Social Worker [phone]

## 2019-01-24 NOTE — Progress Notes (Signed)
Nutrition Follow Up  DOCUMENTATION CODES:   Obesity unspecified  INTERVENTION:    Prostat liquid protein po 30 ml BID with meals, each supplement provides 100 kcal, 15 grams protein  NUTRITION DIAGNOSIS:   Increased nutrient needs related to wound healing as evidenced by estimated needs, ongoing  GOAL:   Patient will meet greater than or equal to 90% of their needs, met  MONITOR:   PO intake, Supplement acceptance, Labs, Skin, Weight trends  ASSESSMENT:   43 yo Female with PMH of ESRD on HD, PVD; admitted with worsening left foot pain from wound clinic and arterial studies showed stenosis of common and deep femoral and complete occlusion SFA.  1/23 s/p aortogram  Pt now on a Heart Healthy diet. PO intake excellent at 100% per flowsheets. Taking her liquid protein supplements (ie Prostat). Labs & medications reviewed.  Nutrition focused physical exam completed.   No muscle or subcutaneous fat depletion noticed.  Diet Order:   Diet Order            Diet Heart Room service appropriate? Yes; Fluid consistency: Thin; Fluid restriction: 1200 mL Fluid  Diet effective now             EDUCATION NEEDS:   No education needs have been identified at this time  Skin:  Skin Assessment: Skin Integrity Issues: Skin Integrity Issues:: Unstageable Unstageable: L foot  Last BM:  1/26  Height:   Ht Readings from Last 1 Encounters:  01/18/19 5' 4"  (1.626 m)   Weight:   Wt Readings from Last 1 Encounters:  01/23/19 89.5 kg   BMI:  Body mass index is 33.87 kg/m.  Estimated Nutritional Needs:   Kcal:  1800-2000  Protein:  100-115 gm  Fluid:  1.8-2.0 L  Arthur Holms, RD, LDN Pager #: [phone] After-Hours Pager #: [phone]

## 2019-01-25 LAB — CBC
HCT: 27.7 % — ABNORMAL LOW (ref 36.0–46.0)
Hemoglobin: 8.7 g/dL — ABNORMAL LOW (ref 12.0–15.0)
MCH: 30.9 pg (ref 26.0–34.0)
MCHC: 31.4 g/dL (ref 30.0–36.0)
MCV: 98.2 fL (ref 80.0–100.0)
Platelets: 307 10*3/uL (ref 150–400)
RBC: 2.82 MIL/uL — ABNORMAL LOW (ref 3.87–5.11)
RDW: 14.1 % (ref 11.5–15.5)
WBC: 12.2 10*3/uL — ABNORMAL HIGH (ref 4.0–10.5)
nRBC: 0 % (ref 0.0–0.2)

## 2019-01-25 MED ORDER — PENTAFLUOROPROP-TETRAFLUOROETH EX AERO: 1.0000 "application " | INHALATION_SPRAY | CUTANEOUS | Status: DC | PRN

## 2019-01-25 MED ORDER — HYDROMORPHONE HCL 1 MG/ML IJ SOLN
INTRAMUSCULAR | Status: AC
  Administered 2019-01-25: 1 mg via INTRAVENOUS
  Filled 2019-01-25: qty 1

## 2019-01-25 MED ORDER — LIDOCAINE-PRILOCAINE 2.5-2.5 % EX CREA
1.0000 "application " | TOPICAL_CREAM | CUTANEOUS | Status: DC | PRN
  Filled 2019-01-25: qty 5

## 2019-01-25 MED ORDER — OXYCODONE HCL 5 MG PO TABS
ORAL_TABLET | ORAL | Status: AC
  Filled 2019-01-25: qty 2

## 2019-01-25 MED ORDER — HEPARIN SODIUM (PORCINE) 1000 UNIT/ML DIALYSIS
1000.0000 [IU] | INTRAMUSCULAR | Status: DC | PRN
  Filled 2019-01-25: qty 1

## 2019-01-25 MED ORDER — SODIUM CHLORIDE 0.9 % IV SOLN: 100.0000 mL | INTRAVENOUS | Status: DC | PRN

## 2019-01-25 MED ORDER — HYDROMORPHONE HCL 1 MG/ML IJ SOLN
INTRAMUSCULAR | Status: AC
  Filled 2019-01-25: qty 0.5

## 2019-01-25 MED ORDER — ALTEPLASE 2 MG IJ SOLR: 2.0000 mg | Freq: Once | INTRAMUSCULAR | Status: DC | PRN

## 2019-01-25 MED ORDER — LIDOCAINE HCL (PF) 1 % IJ SOLN: 5.0000 mL | INTRAMUSCULAR | Status: DC | PRN

## 2019-01-25 NOTE — Progress Notes (Addendum)
  Progress Note    01/25/2019 8:03 AM 7 Days Post-Op  Subjective:  Still having pain around incisions and TMA   Vitals:   01/25/19 0312 01/25/19 0610  BP: 124/75   Pulse: 81   Resp: 18 15  Temp: 98.4 F (36.9 C)   SpO2: 100%    Physical Exam: Lungs:  Non labored Incisions:  L groin incision c/d/i; vein harvest incisions also healing well; L TMA edematous, some thick drainage from lateral aspect of incision Extremities:  L foot warm to touch; pain even to touch with doppler probe Abdomen:  Soft Neurologic: A&O  CBC    Component Value Date/Time   WBC 10.4 01/24/2019 0301   RBC 2.50 (L) 01/24/2019 0301   HGB 7.9 (L) 01/24/2019 0301   HCT 24.5 (L) 01/24/2019 0301   PLT 233 01/24/2019 0301   MCV 98.0 01/24/2019 0301   MCH 31.6 01/24/2019 0301   MCHC 32.2 01/24/2019 0301   RDW 14.8 01/24/2019 0301   LYMPHSABS 1.0 01/20/2019 0842   MONOABS 1.2 (H) 01/20/2019 0842   EOSABS 0.1 01/20/2019 0842   BASOSABS 0.1 01/20/2019 0842    BMET    Component Value Date/Time   NA 137 01/24/2019 0301   K 3.6 01/24/2019 0301   CL 98 01/24/2019 0301   CO2 27 01/24/2019 0301   GLUCOSE 88 01/24/2019 0301   BUN 35 (H) 01/24/2019 0301   CREATININE 6.56 (H) 01/24/2019 0301   CALCIUM 8.9 01/24/2019 0301   GFRNONAA 7 (L) 01/24/2019 0301   GFRAA 8 (L) 01/24/2019 0301    INR No results found for: INR   Intake/Output Summary (Last 24 hours) at 01/25/2019 7915 Last data filed at 01/24/2019 2000 Gross per 24 hour  Intake 250 ml  Output -  Net 250 ml     Assessment/Plan:  43 y.o. female is s/p L fem-pop with vein and TMA 7 Days Post-Op   Perfusing LLE well currently L TMA continues to be painful to light touch, now with some drainage from lateral aspect of incision Awaiting SNF bed HD on TTS per Nephrology   Dagoberto Ligas, PA-C [facility] [phone] 01/25/2019 8:03 AM

## 2019-01-25 NOTE — Progress Notes (Signed)
[facility] PROGRESS NOTE    Progress Note  Rebekah Burton  FMB:[id number] DOB: 1976/08/11 DOA: 01/16/2019 PCP: Patient, [name]     Brief Narrative:   Rebekah Burton is an 43 y.o. female past medical history peripheral vascular disease, end-stage renal disease on dialysis, hypertension, legally blind, previous arterial studies show stenosis of the common and deep femoral artery with complete occlusion of the superficial femoral artery presents to the ED with foot pain, she underwent bypass and amputation for peripheral vascular disease with left foot ulcer.  Assessment/Plan:   Ischemic ulcer of left foot/  S/P transmetatarsal amputation of foot, left ([place]): There is post left metatarsal amputation on 01/18/2019. Physical therapy evaluated the patient recommended skilled nursing facility. Vascular surgery recommended weightbearing as tolerated. Antibiotics have been discontinued. Cont. weightbearing as tolerated.    ESRD (end stage renal disease) ([place]) Continue dialysis per nephrology.  Essential HTN (hypertension) Stable continue amlodipine, clonidine and Norvasc.  Blind in both eyes  Acute blood loss anemia/acute blood loss anemia: Given 2 units of packed red blood cells with improvement in his hemoglobin. Continue Aranesp and IV iron per renal.  Secondary hyperparathyroidism: Continue binder per renal.    DVT prophylaxis: lovenox Family Communication:none Disposition Plan/Barrier to D/C: Skilled nursing facility when bed available. Code Status:     Code Status Orders  (From admission, onward)         Start     Ordered   01/16/19 2142  Full code  Continuous     01/16/19 2143        Code Status History    Date Active Date Inactive Code Status Order ID Comments User Context   07/17/2018 1530 07/18/2018 2003 Full Code [id number]  Deno Etienne, DO ED        IV Access:    Peripheral IV   Procedures and diagnostic studies:   No results  found.   Medical Consultants:    None.  Anti-Infectives:   None  Subjective:    Rebekah Burton she relates no complaints, tolerating her diet.  Objective:    Vitals:   01/24/19 1945 01/25/19 0309 01/25/19 0312 01/25/19 0610  BP: (!) 117/53  124/75   Pulse: 81  81   Resp: 18 (!) 25 18 15   Temp: 99.3 F (37.4 C)  98.4 F (36.9 C)   TempSrc: Oral  Oral   SpO2: 98%  100%   Weight:    91.2 kg  Height:        Intake/Output Summary (Last 24 hours) at 01/25/2019 0740 Last data filed at 01/24/2019 2000 Gross per 24 hour  Intake 250 ml  Output -  Net 250 ml   Filed Weights   01/23/19 0710 01/23/19 1113 01/25/19 0610  Weight: 92.3 kg 89.5 kg 91.2 kg    Exam: General exam: In no acute distress. Respiratory system: Good air movement and clear to auscultation. Cardiovascular system: S1 & S2 heard, RRR.  Gastrointestinal system: Abdomen is nondistended, soft and nontender.  Central nervous system: Alert and oriented. No focal neurological deficits. Extremities: No pedal edema. Skin: No rashes, lesions or ulcers Psychiatry: Judgement and insight appear normal. Mood & affect appropriate.    Data Reviewed:    Labs: Basic Metabolic Panel: Recent Labs  Lab 01/19/19 1003 01/20/19 0842 01/21/19 0706 01/23/19 0732 01/24/19 0301  NA 131* 134* 131* 133* 137  K 3.9 3.5 3.6 3.8 3.6  CL 93* 92* 91* 96* 98  CO2 23 27 22 22  27  GLUCOSE 138* 113* 135* 128* 88  BUN 56* 36* 60* 63* 35*  CREATININE 11.79* 8.10* 10.82* 9.98* 6.56*  CALCIUM 9.1 9.1 8.7* 9.3 8.9  PHOS 5.7*  --  4.9* 4.4  --    GFR Estimated Creatinine Clearance: 12.5 mL/min (A) (by C-G formula based on SCr of 6.56 mg/dL (H)). Liver Function Tests: Recent Labs  Lab 01/19/19 1003 01/21/19 0706 01/23/19 0732  ALBUMIN 2.8* 2.5* 2.2*   No results for input(s): LIPASE, AMYLASE in the last 168 hours. No results for input(s): AMMONIA in the last 168 hours. Coagulation profile No results for input(s): INR,  PROTIME in the last 168 hours.  CBC: Recent Labs  Lab 01/20/19 0842 01/21/19 0838 01/22/19 1028 01/23/19 0732 01/24/19 0301  WBC 13.1* 8.3 11.6* 10.0 10.4  NEUTROABS 10.5*  --   --   --   --   HGB 7.3* 5.9* 8.2* 7.7* 7.9*  HCT 23.4* 18.9* 25.5* 23.7* 24.5*  MCV 98.7 100.0 97.3 98.8 98.0  PLT 264 212 244 265 233   Cardiac Enzymes: No results for input(s): CKTOTAL, CKMB, CKMBINDEX, TROPONINI in the last 168 hours. BNP (last 3 results) No results for input(s): PROBNP in the last 8760 hours. CBG: No results for input(s): GLUCAP in the last 168 hours. D-Dimer: No results for input(s): DDIMER in the last 72 hours. Hgb A1c: No results for input(s): HGBA1C in the last 72 hours. Lipid Profile: No results for input(s): CHOL, HDL, LDLCALC, TRIG, CHOLHDL, LDLDIRECT in the last 72 hours. Thyroid function studies: No results for input(s): TSH, T4TOTAL, T3FREE, THYROIDAB in the last 72 hours.  Invalid input(s): FREET3 Anemia work up: No results for input(s): VITAMINB12, FOLATE, FERRITIN, TIBC, IRON, RETICCTPCT in the last 72 hours. Sepsis Labs: Recent Labs  Lab 01/21/19 9233 01/22/19 1028 01/23/19 0732 01/24/19 0301  WBC 8.3 11.6* 10.0 10.4   Microbiology Recent Results (from the past 240 hour(s))  Aerobic Culture (superficial specimen)     Status: None   Collection Time: 01/15/19 12:30 PM  Result Value Ref Range Status   Specimen Description   Final    FOOT LEFT Performed at [facility] [address], [place], [place] [address]    Special Requests   Final    NONE Performed at [facility], [place] [address], [place], [place] [address]    Gram Stain   Final    RARE WBC PRESENT, PREDOMINANTLY MONONUCLEAR MODERATE GRAM POSITIVE COCCI ABUNDANT GRAM NEGATIVE RODS Performed at [facility], [place] [address], [place],  [address]    Culture   Final    ABUNDANT ESCHERICHIA COLI ABUNDANT PSEUDOMONAS AERUGINOSA    Report  Status 01/19/2019 FINAL  Final   Organism ID, Bacteria ESCHERICHIA COLI  Final   Organism ID, Bacteria PSEUDOMONAS AERUGINOSA  Final      Susceptibility   Escherichia coli - MIC*    AMPICILLIN >=32 RESISTANT Resistant     CEFAZOLIN 16 SENSITIVE Sensitive     CEFEPIME <=1 SENSITIVE Sensitive     CEFTAZIDIME <=1 SENSITIVE Sensitive     CEFTRIAXONE <=1 SENSITIVE Sensitive     CIPROFLOXACIN <=0.25 SENSITIVE Sensitive     GENTAMICIN <=1 SENSITIVE Sensitive     IMIPENEM <=0.25 SENSITIVE Sensitive     TRIMETH/SULFA <=20 SENSITIVE Sensitive     AMPICILLIN/SULBACTAM >=32 RESISTANT Resistant     PIP/TAZO 8 SENSITIVE Sensitive     Extended ESBL NEGATIVE Sensitive     * ABUNDANT ESCHERICHIA COLI  Pseudomonas aeruginosa - MIC*    CEFTAZIDIME 4 SENSITIVE Sensitive     CIPROFLOXACIN <=0.25 SENSITIVE Sensitive     GENTAMICIN <=1 SENSITIVE Sensitive     IMIPENEM 2 SENSITIVE Sensitive     PIP/TAZO 16 SENSITIVE Sensitive     CEFEPIME 4 SENSITIVE Sensitive     * ABUNDANT PSEUDOMONAS AERUGINOSA  Blood Cultures x 2 sites     Status: None   Collection Time: 01/16/19 11:15 PM  Result Value Ref Range Status   Specimen Description BLOOD RIGHT ANTECUBITAL  Final   Special Requests   Final    BOTTLES DRAWN AEROBIC ONLY Blood Culture results may not be optimal due to an inadequate volume of blood received in culture bottles   Culture   Final    NO GROWTH 5 DAYS Performed at [facility], [place] [address], [place], [place] [address]    Report Status 01/22/2019 FINAL  Final  Blood Cultures x 2 sites     Status: None   Collection Time: 01/16/19 11:23 PM  Result Value Ref Range Status   Specimen Description BLOOD RIGHT HAND  Final   Special Requests   Final    BOTTLES DRAWN AEROBIC AND ANAEROBIC Blood Culture adequate volume   Culture   Final    NO GROWTH 5 DAYS Performed at [facility], [place] [address], [place], [place] [address]    Report Status 01/22/2019 FINAL  Final  Surgical pcr  screen     Status: Abnormal   Collection Time: 01/18/19  8:56 AM  Result Value Ref Range Status   MRSA, PCR POSITIVE (A) NEGATIVE Final    Comment: RESULT CALLED TO, READ BACK BY AND VERIFIED WITH: Brooke Pace RN 11:15 01/18/19 ([email or web address])    Staphylococcus aureus POSITIVE (A) NEGATIVE Final    Comment: (NOTE) The Xpert SA Assay (FDA approved for NASAL specimens in patients 27 years of age and older), is one component of a comprehensive surveillance program. It is not intended to diagnose infection nor to guide or monitor treatment. Performed at [facility], [place] [address], [place], [place] [address]      Medications:   . sodium chloride   Intravenous Once  . amLODipine  10 mg Oral QHS  . aspirin EC  81 mg Oral Daily  . carvedilol  25 mg Oral BID WC  . Chlorhexidine Gluconate Cloth  6 each Topical Q0600  . cloNIDine  0.1 mg Oral TID  . [START ON 01/30/2019] darbepoetin (ARANESP) injection - DIALYSIS  200 mcg Intravenous Q Wed-HD  . docusate sodium  100 mg Oral Daily  . feeding supplement (PRO-STAT SUGAR FREE 64)  30 mL Oral BID  . multivitamin  1 tablet Oral QHS  . pantoprazole  40 mg Oral Daily  . sevelamer carbonate  3,200 mg Oral TID WC  . sodium chloride flush  3 mL Intravenous Q12H   Continuous Infusions: . sodium chloride    . sodium chloride    . sodium chloride    . magnesium sulfate 1 - 4 g bolus IVPB        LOS: 9 days   Charlynne Cousins  [facility]  01/25/2019, 7:40 AM

## 2019-01-25 NOTE — Discharge Instructions (Signed)
 [facility]  Discharge instructions  Lower Extremity Bypass Surgery  Please refer to the following instruction for your post-procedure care. Your surgeon or physician assistant will discuss any changes with you.  Activity  You are encouraged to walk as much as you can. You can slowly return to normal activities during the month after your surgery. Avoid strenuous activity and heavy lifting until your doctor tells you it's OK. Avoid activities such as vacuuming or swinging a golf club. Do not drive until your doctor give the OK and you are no longer taking prescription pain medications. It is also normal to have difficulty with sleep habits, eating and bowel movement after surgery. These will go away with time.  Bathing/Showering  You may shower after you go home. Do not soak in a bathtub, hot tub, or swim until the incision heals completely.  Incision Care  Clean your incision with mild soap and water. Shower every day. Pat the area dry with a clean towel. You do not need a bandage unless otherwise instructed. Do not apply any ointments or creams to your incision. If you have open wounds you will be instructed how to care for them or a visiting nurse may be arranged for you. If you have staples or sutures along your incision they will be removed at your post-op appointment. You may have skin glue on your incision. Do not peel it off. It will come off on its own in about one week. If you have a great deal of moisture in your groin, use a gauze help keep this area dry.  Diet  Resume your normal diet. There are no special food restrictions following this procedure. A low fat/ low cholesterol diet is recommended for all patients with vascular disease. In order to heal from your surgery, it is CRITICAL to get adequate nutrition. Your body requires vitamins, minerals, and protein. Vegetables are the best source of vitamins and minerals. Vegetables also provide the  perfect balance of protein. Processed food has little nutritional value, so try to avoid this.  Medications  Resume taking all your medications unless your doctor or nurse practitioner tells you not to. If your incision is causing pain, you may take over-the-counter pain relievers such as acetaminophen (Tylenol). If you were prescribed a stronger pain medication, please aware these medication can cause nausea and constipation. Prevent nausea by taking the medication with a snack or meal. Avoid constipation by drinking plenty of fluids and eating foods with high amount of fiber, such as fruits, vegetables, and grains. Take Colase 100 mg (an over-the-counter stool softener) twice a day as needed for constipation. Do not take Tylenol if you are taking prescription pain medications.  Follow Up  Our office will schedule a follow up appointment 2-3 weeks following discharge.  Please call us immediately for any of the following conditions  Severe or worsening pain in your legs or feet while at rest or while walking Increase pain, redness, warmth, or drainage (pus) from your incision site(s) Fever of 101 degree or higher The swelling in your leg with the bypass suddenly worsens and becomes more painful than when you were in the hospital If you have been instructed to feel your graft pulse then you should do so every day. If you can no longer feel this pulse, call the office immediately. Not all patients are given this instruction.  Leg swelling is common after leg bypass surgery.  The swelling should improve over a few months   following surgery. To improve the swelling, you may elevate your legs above the level of your heart while you are sitting or resting. Your surgeon or physician assistant may ask you to apply an ACE wrap or wear compression (TED) stockings to help to reduce swelling.  Reduce your risk of vascular disease  Stop smoking. If you would like help call [facility] at 1-800-QUIT-NOW  (1-800-784-8669) or [place] at 336-586-4000.  Manage your cholesterol Maintain a desired weight Control your diabetes weight Control your diabetes Keep your blood pressure down  If you have any questions, please call the office at [phone]   

## 2019-01-25 NOTE — Progress Notes (Signed)
2:34pm- [facility] and discussing possible options and will contact CSW.    2:03pm-CSW called to follow up on [employer] application-   75:05 pm- CSW contacted [employer] [phone] and left voice message with [facility]- requesting she return call to verify receipt of patient's SCAT application.  Patient must have dialysis transportation arranged before she is discharged to [facility].   Thurmond Butts, [place] Social Worker [phone]

## 2019-01-25 NOTE — Progress Notes (Signed)
[facility] Progress Note   Dialysis Orders: Monday/Wednesday/Friday, [facility], 4 hours 15 minutes, EDW 90 kg, BFR 400, DFR 800, 2K/2.5 calcium, no UF profile/sodium modeling. Mircera 100 mcg every 2 weeks (last dose 1/22), Hectorol 5 mcg q. dialysis and Parsabiv 7.5 mg q. dialysis. Left BCF  Problem/Plan: 1. 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 Dr Donnetta Hutching.  SNF pending 2. End-stage renal disease: On hemodialysis MWF , Ks in 3 - use 3 K bath 4h No heparin 2-3L UF.   3   Hypertension/ Volume :HD Monday post wt 88.5  Below edw, BPs hve been soft, reduced clonidine - hold for SBP < 120 net UF 2.3 Wed to 89.5 - may be able to d/c clonidine fully - has LLE edema- needs a little more volume down 4. Anemia:  S/p 2u pRBC 1/27 for Hb 5.9.  hgb 7.9 1/30 . Rec outpt Mircera 1/22 - redose next week -  5 Secondary hyperparathyroidism: Cont binders, but has been off hectorol. She is usually on [place] which is unavailable here./ out pt bath 2.5 ca  - corr Ca ~10 due to low alb and off parsabiv - use 2.25 Ca bath P ok 6. Nutrition - ok to have heart healthy diet- added fluid restriction + multivit/prostat for low alb  Subjective:   No c/o   Objective Vitals:   01/25/19 0610 01/25/19 0905 01/25/19 0959 01/25/19 1130  BP:  (!) 141/84 (!) 141/84   Pulse:  79    Resp: 15 12  (!) 23  Temp:  98.4 F (36.9 C)    TempSrc:  Oral    SpO2:  98%    Weight: 91.2 kg     Height:       Physical Exam General: NAD sitting in bed Heart: RRR Lungs: no rales Abdomen: obese soft  Extremities: no RLE edema 2+ LLE transmet edema  Dialysis Access: left AVF + bruit   Additional Objective Labs: Basic Metabolic Panel: Recent Labs  Lab 01/19/19 1003  01/21/19 0706 01/23/19 0732 01/24/19 0301  NA 131*   < > 131* 133* 137  K 3.9   < > 3.6 3.8 3.6  CL 93*   < > 91* 96* 98  CO2 23   < > 22 22 27    GLUCOSE 138*   < > 135* 128* 88  BUN 56*   < > 60* 63* 35*  CREATININE 11.79*   < > 10.82* 9.98* 6.56*  CALCIUM 9.1   < > 8.7* 9.3 8.9  PHOS 5.7*  --  4.9* 4.4  --    < > = values in this interval not displayed.   Liver Function Tests: Recent Labs  Lab 01/19/19 1003 01/21/19 0706 01/23/19 0732  ALBUMIN 2.8* 2.5* 2.2*   No results for input(s): LIPASE, AMYLASE in the last 168 hours. CBC: Recent Labs  Lab 01/20/19 0842 01/21/19 0838 01/22/19 1028 01/23/19 0732 01/24/19 0301  WBC 13.1* 8.3 11.6* 10.0 10.4  NEUTROABS 10.5*  --   --   --   --   HGB 7.3* 5.9* 8.2* 7.7* 7.9*  HCT 23.4* 18.9* 25.5* 23.7* 24.5*  MCV 98.7 100.0 97.3 98.8 98.0  PLT 264 212 244 265 233   Blood Culture    Component Value Date/Time   SDES BLOOD RIGHT HAND 01/16/2019 2323   SPECREQUEST  01/16/2019 2323    BOTTLES DRAWN AEROBIC AND ANAEROBIC Blood Culture adequate  volume   CULT  01/16/2019 2323    NO GROWTH 5 DAYS Performed at [facility], [place] [address] [place], [place] [address]    REPTSTATUS 01/22/2019 FINAL 01/16/2019 2323    Cardiac Enzymes: No results for input(s): CKTOTAL, CKMB, CKMBINDEX, TROPONINI in the last 168 hours. CBG: No results for input(s): GLUCAP in the last 168 hours. Iron Studies: No results for input(s): IRON, TIBC, TRANSFERRIN, FERRITIN in the last 72 hours. No results found for: INR, PROTIME Studies/Results: No results found. Medications: . sodium chloride    . sodium chloride    . sodium chloride    . magnesium sulfate 1 - 4 g bolus IVPB     . sodium chloride   Intravenous Once  . amLODipine  10 mg Oral QHS  . aspirin EC  81 mg Oral Daily  . carvedilol  25 mg Oral BID WC  . Chlorhexidine Gluconate Cloth  6 each Topical Q0600  . cloNIDine  0.1 mg Oral TID  . [START ON 01/30/2019] darbepoetin (ARANESP) injection - DIALYSIS  200 mcg Intravenous Q Wed-HD  . docusate sodium  100 mg Oral Daily  . feeding supplement (PRO-STAT SUGAR FREE 64)  30 mL Oral BID   . multivitamin  1 tablet Oral QHS  . pantoprazole  40 mg Oral Daily  . sevelamer carbonate  3,200 mg Oral TID WC  . sodium chloride flush  3 mL Intravenous Q12H

## 2019-01-25 NOTE — Plan of Care (Signed)
Plan of care has been reviewed.  Problem: Pain Managment: surgical pain scale 10/10 on left foot. Goal: General experience of comfort will improve Outcome: Progressing: well managed with Oxycodone and Dilaudid PRN. Rest well comfortably on the bed.  Problem: Clinical Measurements: Goal: Cardiovascular complication will be avoided Outcome: Progressing: EKG is normal sinus rhythm on monitor, BP stable, no distress.  Problem: Elimination:Pt is anuria, ESRD, dialysis on Mon, Wed, Fri Goal: Will not experience complications related to urinary retention Outcome: Progressing  Problem: Clinical Measurements: Goal: Will remain free from infection Outcome: Progressing  Problem: Health Behavior/Discharge Planning: dependent, legally blind both eyes, need maximum assistant post discharge. Goal: Ability to manage health-related needs will improve Outcome: progressing : awaiting for SNF.    Kennyth Lose, BSN,RN,PCCN-CMC

## 2019-01-26 DIAGNOSIS — I70445 Atherosclerosis of autologous vein bypass graft(s) of the left leg with ulceration of other part of foot: Secondary | ICD-10-CM | POA: Diagnosis not present

## 2019-01-26 DIAGNOSIS — G8918 Other acute postprocedural pain: Secondary | ICD-10-CM | POA: Diagnosis not present

## 2019-01-26 DIAGNOSIS — I2510 Atherosclerotic heart disease of native coronary artery without angina pectoris: Secondary | ICD-10-CM | POA: Diagnosis present

## 2019-01-26 DIAGNOSIS — Z7401 Bed confinement status: Secondary | ICD-10-CM | POA: Diagnosis not present

## 2019-01-26 DIAGNOSIS — H548 Legal blindness, as defined in USA: Secondary | ICD-10-CM | POA: Diagnosis present

## 2019-01-26 DIAGNOSIS — L97523 Non-pressure chronic ulcer of other part of left foot with necrosis of muscle: Secondary | ICD-10-CM | POA: Diagnosis not present

## 2019-01-26 DIAGNOSIS — S98312D Complete traumatic amputation of left midfoot, subsequent encounter: Secondary | ICD-10-CM | POA: Diagnosis not present

## 2019-01-26 DIAGNOSIS — L97529 Non-pressure chronic ulcer of other part of left foot with unspecified severity: Secondary | ICD-10-CM | POA: Diagnosis not present

## 2019-01-26 DIAGNOSIS — A499 Bacterial infection, unspecified: Secondary | ICD-10-CM | POA: Diagnosis not present

## 2019-01-26 DIAGNOSIS — I10 Essential (primary) hypertension: Secondary | ICD-10-CM | POA: Diagnosis not present

## 2019-01-26 DIAGNOSIS — D649 Anemia, unspecified: Secondary | ICD-10-CM | POA: Diagnosis not present

## 2019-01-26 DIAGNOSIS — D62 Acute posthemorrhagic anemia: Secondary | ICD-10-CM | POA: Diagnosis not present

## 2019-01-26 DIAGNOSIS — M2550 Pain in unspecified joint: Secondary | ICD-10-CM | POA: Diagnosis not present

## 2019-01-26 DIAGNOSIS — M7989 Other specified soft tissue disorders: Secondary | ICD-10-CM | POA: Diagnosis not present

## 2019-01-26 DIAGNOSIS — Y835 Amputation of limb(s) as the cause of abnormal reaction of the patient, or of later complication, without mention of misadventure at the time of the procedure: Secondary | ICD-10-CM | POA: Diagnosis present

## 2019-01-26 DIAGNOSIS — I129 Hypertensive chronic kidney disease with stage 1 through stage 4 chronic kidney disease, or unspecified chronic kidney disease: Secondary | ICD-10-CM | POA: Diagnosis not present

## 2019-01-26 DIAGNOSIS — Z86011 Personal history of benign neoplasm of the brain: Secondary | ICD-10-CM | POA: Diagnosis not present

## 2019-01-26 DIAGNOSIS — R29898 Other symptoms and signs involving the musculoskeletal system: Secondary | ICD-10-CM | POA: Diagnosis not present

## 2019-01-26 DIAGNOSIS — L089 Local infection of the skin and subcutaneous tissue, unspecified: Secondary | ICD-10-CM | POA: Diagnosis not present

## 2019-01-26 DIAGNOSIS — M6281 Muscle weakness (generalized): Secondary | ICD-10-CM | POA: Diagnosis not present

## 2019-01-26 DIAGNOSIS — N2581 Secondary hyperparathyroidism of renal origin: Secondary | ICD-10-CM | POA: Diagnosis not present

## 2019-01-26 DIAGNOSIS — Z95820 Peripheral vascular angioplasty status with implants and grafts: Secondary | ICD-10-CM | POA: Diagnosis not present

## 2019-01-26 DIAGNOSIS — Z992 Dependence on renal dialysis: Secondary | ICD-10-CM | POA: Diagnosis not present

## 2019-01-26 DIAGNOSIS — H543 Unqualified visual loss, both eyes: Secondary | ICD-10-CM | POA: Diagnosis not present

## 2019-01-26 DIAGNOSIS — N186 End stage renal disease: Secondary | ICD-10-CM | POA: Diagnosis not present

## 2019-01-26 DIAGNOSIS — Z8619 Personal history of other infectious and parasitic diseases: Secondary | ICD-10-CM | POA: Diagnosis not present

## 2019-01-26 DIAGNOSIS — S91302D Unspecified open wound, left foot, subsequent encounter: Secondary | ICD-10-CM | POA: Diagnosis not present

## 2019-01-26 DIAGNOSIS — R262 Difficulty in walking, not elsewhere classified: Secondary | ICD-10-CM | POA: Diagnosis not present

## 2019-01-26 DIAGNOSIS — T148XXA Other injury of unspecified body region, initial encounter: Secondary | ICD-10-CM | POA: Diagnosis not present

## 2019-01-26 DIAGNOSIS — Z89432 Acquired absence of left foot: Secondary | ICD-10-CM | POA: Diagnosis not present

## 2019-01-26 DIAGNOSIS — Z885 Allergy status to narcotic agent status: Secondary | ICD-10-CM | POA: Diagnosis not present

## 2019-01-26 DIAGNOSIS — Z79899 Other long term (current) drug therapy: Secondary | ICD-10-CM | POA: Diagnosis not present

## 2019-01-26 DIAGNOSIS — Z7982 Long term (current) use of aspirin: Secondary | ICD-10-CM | POA: Diagnosis not present

## 2019-01-26 DIAGNOSIS — I739 Peripheral vascular disease, unspecified: Secondary | ICD-10-CM | POA: Diagnosis present

## 2019-01-26 DIAGNOSIS — I959 Hypotension, unspecified: Secondary | ICD-10-CM | POA: Diagnosis not present

## 2019-01-26 DIAGNOSIS — S91309A Unspecified open wound, unspecified foot, initial encounter: Secondary | ICD-10-CM | POA: Diagnosis not present

## 2019-01-26 DIAGNOSIS — D631 Anemia in chronic kidney disease: Secondary | ICD-10-CM | POA: Diagnosis not present

## 2019-01-26 DIAGNOSIS — S91302A Unspecified open wound, left foot, initial encounter: Secondary | ICD-10-CM | POA: Diagnosis not present

## 2019-01-26 DIAGNOSIS — L97524 Non-pressure chronic ulcer of other part of left foot with necrosis of bone: Secondary | ICD-10-CM | POA: Diagnosis not present

## 2019-01-26 DIAGNOSIS — L03116 Cellulitis of left lower limb: Secondary | ICD-10-CM | POA: Diagnosis not present

## 2019-01-26 DIAGNOSIS — Z22322 Carrier or suspected carrier of Methicillin resistant Staphylococcus aureus: Secondary | ICD-10-CM | POA: Diagnosis not present

## 2019-01-26 DIAGNOSIS — R5381 Other malaise: Secondary | ICD-10-CM | POA: Diagnosis not present

## 2019-01-26 DIAGNOSIS — I120 Hypertensive chronic kidney disease with stage 5 chronic kidney disease or end stage renal disease: Secondary | ICD-10-CM | POA: Diagnosis present

## 2019-01-26 DIAGNOSIS — T8781 Dehiscence of amputation stump: Secondary | ICD-10-CM | POA: Diagnosis present

## 2019-01-26 DIAGNOSIS — F17210 Nicotine dependence, cigarettes, uncomplicated: Secondary | ICD-10-CM | POA: Diagnosis present

## 2019-01-26 LAB — RENAL FUNCTION PANEL
Albumin: 2.5 g/dL — ABNORMAL LOW (ref 3.5–5.0)
Anion gap: 13 (ref 5–15)
BUN: 22 mg/dL — ABNORMAL HIGH (ref 6–20)
CO2: 27 mmol/L (ref 22–32)
Calcium: 8.9 mg/dL (ref 8.9–10.3)
Chloride: 97 mmol/L — ABNORMAL LOW (ref 98–111)
Creatinine, Ser: 4.22 mg/dL — ABNORMAL HIGH (ref 0.44–1.00)
GFR calc Af Amer: 14 mL/min — ABNORMAL LOW
GFR calc non Af Amer: 12 mL/min — ABNORMAL LOW
Glucose, Bld: 127 mg/dL — ABNORMAL HIGH (ref 70–99)
Phosphorus: 1.6 mg/dL — ABNORMAL LOW (ref 2.5–4.6)
Potassium: 3.9 mmol/L (ref 3.5–5.1)
Sodium: 137 mmol/L (ref 135–145)

## 2019-01-26 MED ORDER — ASPIRIN 81 MG PO TBEC: 81.0000 mg | DELAYED_RELEASE_TABLET | Freq: Every day | ORAL | Status: DC

## 2019-01-26 MED ORDER — OXYCODONE HCL 5 MG PO TABS: 5.0000 mg | ORAL_TABLET | ORAL | 0 refills | Status: DC | PRN

## 2019-01-26 NOTE — Progress Notes (Signed)
Patient will Discharge NP:[id number] [facility] Anticipated DC Date:01/26/2019 Family Notified: no, patient will contact niece Transport SB:[id number]   Per MD patient ready for DC to [facility] . RN, patient, patient's family, and facility notified of DC. Assessment, Fl2/Pasrr, and Discharge Summary sent to facility. RN given number for report [phone], Room # 107). DC packet on chart. Ambulance transport requested for patient.   CSW signing off.  Reed Breech LCSWA [phone]

## 2019-01-26 NOTE — Discharge Summary (Signed)
Physician Discharge Summary  Rebekah Burton SJG:[id number] DOB: [birth year] DOA: 01/16/2019  PCP: Patient, [name]  Admit date: 01/16/2019 Discharge date: 01/26/2019  Admitted From: home Disposition:  Home  Recommendations for Outpatient Follow-up:  1. Follow up with PCP in 1-2 weeks 2. Please obtain BMP/CBC in one week   Home Health:No Equipment/Devices:none  Discharge Condition:stable  CODE STATUS:full Diet recommendation: Heart Healthy   Brief/Interim Summary: 43 y.o. female past medical history peripheral vascular disease, end-stage renal disease on dialysis, hypertension, legally blind, previous arterial studies show stenosis of the common and deep femoral artery with complete occlusion of the superficial femoral artery presents to the ED with foot pain, she underwent bypass and amputation for peripheral vascular disease with left foot ulcer.  Discharge Diagnoses:  Principal Problem:   Ischemic ulcer of left foot ([place]) Active Problems:   ESRD (end stage renal disease) ([place])   HTN (hypertension)   Blind in both eyes   PAD (peripheral artery disease) ([facility])   Post-operative pain   Acute blood loss anemia   S/P transmetatarsal amputation of foot, left ([facility])  Ischemic ulcer of the left foot status post metatarsal amputation of left toes: On admission she was started empirically on antibiotics, vascular surgery was consulted and recommended amputation she is post surgical intervention on 01/18/2019. Sickle therapy evaluated the patient and recommended skilled nursing facility. Antibiotics were discontinued.  Vascular surgery recommended weightbearing as tolerated.  End-stage renal disease she will continue on her regular dialysis day.  Essential hypertension: She was continue on amlodipine, clonidine and Norvasc.  Acute blood loss anemia: Likely due to surgical intervention she was given 2 units of packed red blood cells her hemoglobin remained stable. She will continue  Aranesp and IV iron as an outpatient per renal.  Secondary hyperparathyroidism: Continue phosphate binders.   Discharge Instructions  Discharge Instructions    Diet - low sodium heart healthy   Complete by:  As directed    Increase activity slowly   Complete by:  As directed      Allergies as of 01/26/2019      Reactions   Percocet [oxycodone-acetaminophen] Hives      Medication List    TAKE these medications   amLODipine 10 MG tablet Commonly known as:  NORVASC Take 10 mg by mouth daily.   aspirin 81 MG EC tablet Take 1 tablet (81 mg total) by mouth daily.   b complex-vitamin c-folic acid 0.8 MG Tabs tablet Take 1 tablet by mouth every Monday, Wednesday, and Friday with hemodialysis.   carvedilol 25 MG tablet Commonly known as:  COREG Take 25 mg by mouth 2 (two) times daily.   cloNIDine 0.2 MG tablet Commonly known as:  CATAPRES Take 0.2 mg by mouth 3 (three) times daily.   GOODY HEADACHE PO Take 1 packet by mouth as needed (for headaches).   lidocaine-prilocaine cream Commonly known as:  EMLA Apply 1 application topically every Monday, Wednesday, and Friday with hemodialysis.   oxyCODONE 5 MG immediate release tablet Commonly known as:  Oxy IR/ROXICODONE Take 1-2 tablets (5-10 mg total) by mouth every 4 (four) hours as needed for moderate pain.   RENVELA 800 MG tablet Generic drug:  sevelamer carbonate Take 1,600-3,200 mg by mouth See admin instructions. Take 3,200 mg by mouth three times a day with meals and 1,600 mg with each snack      Follow-up Information    Early, Arvilla Meres, MD Follow up in 3 week(s).   Specialties:  Vascular Surgery,  Cardiology Why:  office will call Contact information: [address] [place] [address] [phone]          Allergies  Allergen Reactions  . Percocet [Oxycodone-Acetaminophen] Hives    Consultations:  Vascular surgery  Nephrology   Procedures/Studies: Dg Foot Complete Left  Result Date:  01/16/2019 CLINICAL DATA:  Left foot wound with black and 3rd through 5th toes. Wound present for 2 months. End-stage renal disease. EXAM: LEFT FOOT - COMPLETE 3+ VIEW COMPARISON:  Radiographs 01/15/2019. FINDINGS: Periarticular osteopenia again noted at the 2nd through 5th metatarsophalangeal joints without cortical destruction. There is no evidence of acute fracture or dislocation. There is stable spurring along the lateral base of the 1st proximal phalanx. There are no erosive changes. Forefoot soft tissue swelling appears stable without radiopaque foreign body or soft tissue emphysema. Calcaneal spurring and diffuse vascular calcifications are noted. IMPRESSION: Stable radiographs compared with prior study from yesterday. No radiographic evidence of osteomyelitis. Electronically Signed   By: Richardean Sale M.D.   On: 01/16/2019 19:35   Dg Foot Complete Left  Result Date: 01/15/2019 CLINICAL DATA:  Nonhealing wound on dorsal side of left foot. EXAM: LEFT FOOT - COMPLETE 3+ VIEW COMPARISON:  None. FINDINGS: Mild diffuse soft tissue swelling. There is periarticular osteopenia at the second through fifth MTP joints. No acute fractures, dislocations are areas of bone erosion identified. Posterior and plantar calcaneal heel spurs. IMPRESSION: 1. Soft tissue swelling. 2. No acute bone abnormality. Electronically Signed   By: Kerby Moors M.D.   On: 01/15/2019 20:07   Vas [place] Abi With/wo Tbi  Result Date: 01/22/2019 LOWER EXTREMITY DOPPLER STUDY Indications: Peripheral artery disease.  Limitations: patient positioning and pain tolerance Performing Technologist: Abram Sander RVS  Examination Guidelines: A complete evaluation includes at minimum, Doppler waveform signals and systolic blood pressure reading at the level of bilateral brachial, anterior tibial, and posterior tibial arteries, when vessel segments are accessible. Bilateral testing is considered an integral part of a complete examination.  Photoelectric Plethysmograph (PPG) waveforms and toe systolic pressure readings are included as required and additional duplex testing as needed. Limited examinations for reoccurring indications may be performed as noted.  ABI Findings: +--------+------------------+-----+---------+--------+ Right   Rt Pressure (mmHg)IndexWaveform Comment  +--------+------------------+-----+---------+--------+ Brachial130                    triphasic         +--------+------------------+-----+---------+--------+ PTA     255               1.96 biphasic          +--------+------------------+-----+---------+--------+ DP      255               1.96 biphasic          +--------+------------------+-----+---------+--------+ +--------+------------------+-----+-------------------+------------------------+ Left    Lt Pressure (mmHg)IndexWaveform           Comment                  +--------+------------------+-----+-------------------+------------------------+ Brachial                                          unable to obtain due to  fistula                  +--------+------------------+-----+-------------------+------------------------+ PTA     90                0.69 dampened monophasic                         +--------+------------------+-----+-------------------+------------------------+ DP                             dampened monophasicunable to obtain                                                           pressure due to pain                                                       tolerance                +--------+------------------+-----+-------------------+------------------------+ +-------+-----------+-----------+------------+------------+ ABI/TBIToday's ABIToday's TBIPrevious ABIPrevious TBI +-------+-----------+-----------+------------+------------+ Right  1.96                                            +-------+-----------+-----------+------------+------------+ Left   0.69                                           +-------+-----------+-----------+------------+------------+  Summary: Right: Resting right ankle-brachial index indicates noncompressible right lower extremity arteries. Left: Resting left ankle-brachial index indicates moderate left lower extremity arterial disease.  *See table(s) above for measurements and observations.  Electronically signed by Ruta Hinds MD on 01/22/2019 at 11:49:53 AM.   Final    Vas [place] Lower Extremity Arterial Duplex  Result Date: 01/16/2019 LOWER EXTREMITY ARTERIAL DUPLEX STUDY Indications: Peripheral artery disease, and Left non-healing foot ulcer.  Vascular Interventions: Right Fem-BK Pop BPG placed 2018 per patient at outside                         facility. Current ABI:            Unknown Performing Technologist: Caralee Ates BA, RVT, RDMS  Examination Guidelines: A complete evaluation includes B-mode imaging, spectral Doppler, color Doppler, and power Doppler as needed of all accessible portions of each vessel. Bilateral testing is considered an integral part of a complete examination. Limited examinations for reoccurring indications may be performed as noted.  Right Duplex Findings: +-----------+--------+-----+--------+----------+--------------+            PSV cm/sRatioStenosisWaveform  Comments       +-----------+--------+-----+--------+----------+--------------+ ATA Distal 56                   monophasic               +-----------+--------+-----+--------+----------+--------------+ PTA Mid    80  biphasic                 +-----------+--------+-----+--------+----------+--------------+ PTA Distal 85                   monophasic               +-----------+--------+-----+--------+----------+--------------+ PERO Distal                               Not visualized  +-----------+--------+-----+--------+----------+--------------+  Right Graft #1: Femoral artery - Below knee popliteal artery +------------------+--------+--------+---------+------------+                   PSV cm/sStenosisWaveform Comments     +------------------+--------+--------+---------+------------+ Inflow            65              triphasic             +------------------+--------+--------+---------+------------+ Prox Anastomosis  121             triphasic             +------------------+--------+--------+---------+------------+ Proximal Graft    136             triphasic             +------------------+--------+--------+---------+------------+ Mid Graft         82              triphasic             +------------------+--------+--------+---------+------------+ Distal Graft      111             triphasicValves noted +------------------+--------+--------+---------+------------+ Distal Anastomosis78              triphasic             +------------------+--------+--------+---------+------------+ Outflow           74              triphasic             +------------------+--------+--------+---------+------------+  Left Duplex Findings: +-----------+--------+-----+---------------+-------------------+--------+            PSV cm/sRatioStenosis       Waveform           Comments +-----------+--------+-----+---------------+-------------------+--------+ CFA Distal 221          50-74% stenosismonophasic                  +-----------+--------+-----+---------------+-------------------+--------+ DFA        259          50-74% stenosismonophasic                  +-----------+--------+-----+---------------+-------------------+--------+ SFA Prox   21                                                      +-----------+--------+-----+---------------+-------------------+--------+ SFA Mid                 occluded                                    +-----------+--------+-----+---------------+-------------------+--------+ SFA Distal 11  monophasic                  +-----------+--------+-----+---------------+-------------------+--------+ POP Prox   19                          monophasic                  +-----------+--------+-----+---------------+-------------------+--------+ POP Mid    18                          monophasic                  +-----------+--------+-----+---------------+-------------------+--------+ POP Distal 15                          monophasic                  +-----------+--------+-----+---------------+-------------------+--------+ ATA Distal 15                          monophasic                  +-----------+--------+-----+---------------+-------------------+--------+ PTA Prox   16                          monophasic                  +-----------+--------+-----+---------------+-------------------+--------+ PTA Mid    16                          monophasic                  +-----------+--------+-----+---------------+-------------------+--------+ PTA Distal 21                          monophasic                  +-----------+--------+-----+---------------+-------------------+--------+ PERO Prox  12                          dampened monophasic         +-----------+--------+-----+---------------+-------------------+--------+ PERO Mid   9                           monophasic                  +-----------+--------+-----+---------------+-------------------+--------+ PERO Distal12                          monophasic                  +-----------+--------+-----+---------------+-------------------+--------+  Findings reported to Vivien Rota at Dr. Dellia Nims at 4:11 pm.  Summary: Right Graft(s): Patent right femoral - below knee popliteal BPG without evidence of stenosis. Left: 50-74% stenosis noted in the common femoral artery. 50-74% stenosis noted in the  deep femoral artery. Total occlusion noted in the superficial femoral artery.  See table(s) above for measurements and observations. Electronically signed by Curt Jews MD on 01/16/2019 at 8:13:50 AM.    Final      Subjective: No complaints feels great.  Discharge Exam: Vitals:   01/25/19 1738 01/25/19 2138  BP: 112/64 115/68  Pulse: 85   Resp:  17   Temp: 97.8 F (36.6 C) (!) 97.5 F (36.4 C)  SpO2: 99% 97%     General: Pt is alert, awake, not in acute distress Cardiovascular: RRR, S1/S2 +, no rubs, no gallops Respiratory: CTA bilaterally, no wheezing, no rhonchi Abdominal: Soft, NT, ND, bowel sounds + Extremities: no edema, no cyanosis    The results of significant diagnostics from this hospitalization (including imaging, microbiology, ancillary and laboratory) are listed below for reference.     Microbiology: Recent Results (from the past 240 hour(s))  Blood Cultures x 2 sites     Status: None   Collection Time: 01/16/19 11:15 PM  Result Value Ref Range Status   Specimen Description BLOOD RIGHT ANTECUBITAL  Final   Special Requests   Final    BOTTLES DRAWN AEROBIC ONLY Blood Culture results may not be optimal due to an inadequate volume of blood received in culture bottles   Culture   Final    NO GROWTH 5 DAYS Performed at [facility], [place] [address] [place], [place] [address]    Report Status 01/22/2019 FINAL  Final  Blood Cultures x 2 sites     Status: None   Collection Time: 01/16/19 11:23 PM  Result Value Ref Range Status   Specimen Description BLOOD RIGHT HAND  Final   Special Requests   Final    BOTTLES DRAWN AEROBIC AND ANAEROBIC Blood Culture adequate volume   Culture   Final    NO GROWTH 5 DAYS Performed at [facility], [place] [address] [place], [place] [address]    Report Status 01/22/2019 FINAL  Final  Surgical pcr screen     Status: Abnormal   Collection Time: 01/18/19  8:56 AM  Result Value Ref Range Status   MRSA, PCR POSITIVE (A)  NEGATIVE Final    Comment: RESULT CALLED TO, READ BACK BY AND VERIFIED WITH: Brooke Pace RN 11:15 01/18/19 ([email or web address])    Staphylococcus aureus POSITIVE (A) NEGATIVE Final    Comment: (NOTE) The Xpert SA Assay (FDA approved for NASAL specimens in patients 29 years of age and older), is one component of a comprehensive surveillance program. It is not intended to diagnose infection nor to guide or monitor treatment. Performed at [facility], [place] [address] [place],  [address]      Labs: BNP (last 3 results) No results for input(s): BNP in the last 8760 hours. Basic Metabolic Panel: Recent Labs  Lab 01/19/19 1003 01/20/19 0842 01/21/19 0706 01/23/19 0732 01/24/19 0301 01/25/19 1530  NA 131* 134* 131* 133* 137 137  K 3.9 3.5 3.6 3.8 3.6 3.9  CL 93* 92* 91* 96* 98 97*  CO2 23 27 22 22 27 27   GLUCOSE 138* 113* 135* 128* 88 127*  BUN 56* 36* 60* 63* 35* 22*  CREATININE 11.79* 8.10* 10.82* 9.98* 6.56* 4.22*  CALCIUM 9.1 9.1 8.7* 9.3 8.9 8.9  PHOS 5.7*  --  4.9* 4.4  --  1.6*   Liver Function Tests: Recent Labs  Lab 01/19/19 1003 01/21/19 0706 01/23/19 0732 01/25/19 1530  ALBUMIN 2.8* 2.5* 2.2* 2.5*   No results for input(s): LIPASE, AMYLASE in the last 168 hours. No results for input(s): AMMONIA in the last 168 hours. CBC: Recent Labs  Lab 01/20/19 0842 01/21/19 0838 01/22/19 1028 01/23/19 0732 01/24/19 0301 01/25/19 1530  WBC 13.1* 8.3 11.6* 10.0 10.4 12.2*  NEUTROABS 10.5*  --   --   --   --   --  HGB 7.3* 5.9* 8.2* 7.7* 7.9* 8.7*  HCT 23.4* 18.9* 25.5* 23.7* 24.5* 27.7*  MCV 98.7 100.0 97.3 98.8 98.0 98.2  PLT 264 212 244 265 233 307   Cardiac Enzymes: No results for input(s): CKTOTAL, CKMB, CKMBINDEX, TROPONINI in the last 168 hours. BNP: Invalid input(s): POCBNP CBG: No results for input(s): GLUCAP in the last 168 hours. D-Dimer No results for input(s): DDIMER in the last 72 hours. Hgb A1c No results for input(s): HGBA1C in the last  72 hours. Lipid Profile No results for input(s): CHOL, HDL, LDLCALC, TRIG, CHOLHDL, LDLDIRECT in the last 72 hours. Thyroid function studies No results for input(s): TSH, T4TOTAL, T3FREE, THYROIDAB in the last 72 hours.  Invalid input(s): FREET3 Anemia work up No results for input(s): VITAMINB12, FOLATE, FERRITIN, TIBC, IRON, RETICCTPCT in the last 72 hours. Urinalysis No results found for: COLORURINE, APPEARANCEUR, [place], [place], [place], [place], [place], [place], PROTEINUR, UROBILINOGEN, NITRITE, LEUKOCYTESUR Sepsis Labs Invalid input(s): PROCALCITONIN,  WBC,  LACTICIDVEN Microbiology Recent Results (from the past 240 hour(s))  Blood Cultures x 2 sites     Status: None   Collection Time: 01/16/19 11:15 PM  Result Value Ref Range Status   Specimen Description BLOOD RIGHT ANTECUBITAL  Final   Special Requests   Final    BOTTLES DRAWN AEROBIC ONLY Blood Culture results may not be optimal due to an inadequate volume of blood received in culture bottles   Culture   Final    NO GROWTH 5 DAYS Performed at [facility], [place] [address] [place], [place] [address]    Report Status 01/22/2019 FINAL  Final  Blood Cultures x 2 sites     Status: None   Collection Time: 01/16/19 11:23 PM  Result Value Ref Range Status   Specimen Description BLOOD RIGHT HAND  Final   Special Requests   Final    BOTTLES DRAWN AEROBIC AND ANAEROBIC Blood Culture adequate volume   Culture   Final    NO GROWTH 5 DAYS Performed at [facility], [place] [address] [place], [place] [address]    Report Status 01/22/2019 FINAL  Final  Surgical pcr screen     Status: Abnormal   Collection Time: 01/18/19  8:56 AM  Result Value Ref Range Status   MRSA, PCR POSITIVE (A) NEGATIVE Final    Comment: RESULT CALLED TO, READ BACK BY AND VERIFIED WITH: Brooke Pace RN 11:15 01/18/19 ([email or web address])    Staphylococcus aureus POSITIVE (A) NEGATIVE Final    Comment: (NOTE) The Xpert SA Assay (FDA approved for  NASAL specimens in patients 60 years of age and older), is one component of a comprehensive surveillance program. It is not intended to diagnose infection nor to guide or monitor treatment. Performed at [facility], [place] [address] [place],  [address]      Time coordinating discharge: 40 minutes  SIGNED:   Charlynne Cousins, MD  [facility]

## 2019-01-26 NOTE — Progress Notes (Signed)
[facility] Progress Note   Dialysis Orders: Monday/Wednesday/Friday, [facility], 4 hours 15 minutes, EDW 90 kg, BFR 400, DFR 800, 2K/2.5 calcium, no UF profile/sodium modeling. Mircera 100 mcg every 2 weeks (last dose 1/22), Hectorol 5 mcg q. dialysis and Parsabiv 7.5 mg q. dialysis. Left BCF  Problem/Plan: 1. 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 Dr Donnetta Hutching.  SNF pending 2. End-stage renal disease: On hemodialysis MWF , Ks in 3 - use 3 K bath 4h No heparin 2-3L UF.   3   Hypertension/ Volume :HD Monday post wt 88.5  Below edw,  And will adjust at outpt unit 4. Anemia:  S/p 2u pRBC 1/27 for Hb 5.9.  hgb 8.7 . Rec outpt Mircera 1/22 - redose next week -  5 Secondary hyperparathyroidism: Cont binders, but has been off hectorol. She is usually on [place] which is unavailable here./ out pt bath 2.5 ca  -  6. Nutrition - ok to have heart healthy diet- added fluid restriction + multivit/prostat for low alb  Subjective:   No c/o; for DC today; 2.5L UF with HD yesterday  Objective Vitals:   01/25/19 1730 01/25/19 1738 01/25/19 2138 01/26/19 0830  BP: (!) 110/59 112/64 115/68 121/61  Pulse: 90 85  80  Resp:  17  (!) 24  Temp:  97.8 F (36.6 C) (!) 97.5 F (36.4 C) 98.6 F (37 C)  TempSrc:  Oral Oral Oral  SpO2:  99% 97% 100%  Weight:  88.1 kg    Height:       Physical Exam General: NAD sitting in bed Heart: RRR Lungs: no rales Abdomen: obese soft  Extremities: no RLE edema 2+ LLE transmet edema  Dialysis Access: left AVF + bruit   Additional Objective Labs: Basic Metabolic Panel: Recent Labs  Lab 01/21/19 0706 01/23/19 0732 01/24/19 0301 01/25/19 1530  NA 131* 133* 137 137  K 3.6 3.8 3.6 3.9  CL 91* 96* 98 97*  CO2 22 22 27 27   GLUCOSE 135* 128* 88 127*  BUN 60* 63* 35* 22*  CREATININE 10.82* 9.98* 6.56* 4.22*  CALCIUM 8.7* 9.3 8.9 8.9  PHOS 4.9* 4.4  --   1.6*   Liver Function Tests: Recent Labs  Lab 01/21/19 0706 01/23/19 0732 01/25/19 1530  ALBUMIN 2.5* 2.2* 2.5*   No results for input(s): LIPASE, AMYLASE in the last 168 hours. CBC: Recent Labs  Lab 01/20/19 0842 01/21/19 0838 01/22/19 1028 01/23/19 0732 01/24/19 0301 01/25/19 1530  WBC 13.1* 8.3 11.6* 10.0 10.4 12.2*  NEUTROABS 10.5*  --   --   --   --   --   HGB 7.3* 5.9* 8.2* 7.7* 7.9* 8.7*  HCT 23.4* 18.9* 25.5* 23.7* 24.5* 27.7*  MCV 98.7 100.0 97.3 98.8 98.0 98.2  PLT 264 212 244 265 233 307   Blood Culture    Component Value Date/Time   SDES BLOOD RIGHT HAND 01/16/2019 2323   SPECREQUEST  01/16/2019 2323    BOTTLES DRAWN AEROBIC AND ANAEROBIC Blood Culture adequate volume   CULT  01/16/2019 2323    NO GROWTH 5 DAYS Performed at [facility], [place] [address], [place], [place] [address]    REPTSTATUS 01/22/2019 FINAL 01/16/2019 2323    Cardiac Enzymes: No results for input(s): CKTOTAL, CKMB, CKMBINDEX, TROPONINI in the last 168 hours. CBG: No results for input(s): GLUCAP in the last 168 hours. Iron Studies: No results for input(s):  IRON, TIBC, TRANSFERRIN, FERRITIN in the last 72 hours. No results found for: INR, PROTIME Studies/Results: No results found. Medications: . magnesium sulfate 1 - 4 g bolus IVPB     . sodium chloride   Intravenous Once  . amLODipine  10 mg Oral QHS  . aspirin EC  81 mg Oral Daily  . carvedilol  25 mg Oral BID WC  . Chlorhexidine Gluconate Cloth  6 each Topical Q0600  . cloNIDine  0.1 mg Oral TID  . [START ON 01/30/2019] darbepoetin (ARANESP) injection - DIALYSIS  200 mcg Intravenous Q Wed-HD  . docusate sodium  100 mg Oral Daily  . feeding supplement (PRO-STAT SUGAR FREE 64)  30 mL Oral BID  . multivitamin  1 tablet Oral QHS  . pantoprazole  40 mg Oral Daily  . sevelamer carbonate  3,200 mg Oral TID WC

## 2019-01-26 NOTE — Progress Notes (Signed)
CSW Murvin Natal) spoke with [facility] concerning transportation for dialysis.  [facility] will arrange transportation for dialysis for pt.  Reed Breech LCSWA [phone]

## 2019-01-26 NOTE — Progress Notes (Signed)
Patient left for [facility] by [place].  AVS given and explained to pt with all questions addressed. Peripheral IV removed and vital signs obtained and were stable.  Niece "Key" was notified at [phone].  Receiving nurse at facility [facility] called with report given at 1035.

## 2019-01-29 ENCOUNTER — Telehealth: Payer: Self-pay | Admitting: Vascular Surgery

## 2019-01-29 DIAGNOSIS — N186 End stage renal disease: Secondary | ICD-10-CM | POA: Diagnosis not present

## 2019-01-29 DIAGNOSIS — L97524 Non-pressure chronic ulcer of other part of left foot with necrosis of bone: Secondary | ICD-10-CM | POA: Diagnosis not present

## 2019-01-29 DIAGNOSIS — D649 Anemia, unspecified: Secondary | ICD-10-CM | POA: Diagnosis not present

## 2019-01-29 DIAGNOSIS — I10 Essential (primary) hypertension: Secondary | ICD-10-CM | POA: Diagnosis not present

## 2019-01-29 NOTE — Telephone Encounter (Signed)
sch appt spk to pt mld ltr to SNF per pt 02/19/2019 130pm p/o MD

## 2019-01-29 NOTE — Telephone Encounter (Signed)
-----   Message from Ulyses Amor, [place] sent at 01/25/2019 12:58 PM EST -----  LE bypass and transmetatarsal amputation being discharged to SNF needs f/u with Dr. Donnetta Hutching in 2-3 weeks

## 2019-01-30 DIAGNOSIS — N186 End stage renal disease: Secondary | ICD-10-CM | POA: Diagnosis not present

## 2019-01-30 DIAGNOSIS — D631 Anemia in chronic kidney disease: Secondary | ICD-10-CM | POA: Diagnosis not present

## 2019-01-30 DIAGNOSIS — N2581 Secondary hyperparathyroidism of renal origin: Secondary | ICD-10-CM | POA: Diagnosis not present

## 2019-01-30 DIAGNOSIS — A499 Bacterial infection, unspecified: Secondary | ICD-10-CM | POA: Diagnosis not present

## 2019-01-31 DIAGNOSIS — L97529 Non-pressure chronic ulcer of other part of left foot with unspecified severity: Secondary | ICD-10-CM | POA: Diagnosis not present

## 2019-02-01 DIAGNOSIS — A499 Bacterial infection, unspecified: Secondary | ICD-10-CM | POA: Diagnosis not present

## 2019-02-01 DIAGNOSIS — N186 End stage renal disease: Secondary | ICD-10-CM | POA: Diagnosis not present

## 2019-02-01 DIAGNOSIS — D631 Anemia in chronic kidney disease: Secondary | ICD-10-CM | POA: Diagnosis not present

## 2019-02-01 DIAGNOSIS — N2581 Secondary hyperparathyroidism of renal origin: Secondary | ICD-10-CM | POA: Diagnosis not present

## 2019-02-04 DIAGNOSIS — N2581 Secondary hyperparathyroidism of renal origin: Secondary | ICD-10-CM | POA: Diagnosis not present

## 2019-02-04 DIAGNOSIS — A499 Bacterial infection, unspecified: Secondary | ICD-10-CM | POA: Diagnosis not present

## 2019-02-04 DIAGNOSIS — D631 Anemia in chronic kidney disease: Secondary | ICD-10-CM | POA: Diagnosis not present

## 2019-02-04 DIAGNOSIS — N186 End stage renal disease: Secondary | ICD-10-CM | POA: Diagnosis not present

## 2019-02-05 ENCOUNTER — Emergency Department (HOSPITAL_COMMUNITY): Payer: Medicare Other

## 2019-02-05 ENCOUNTER — Encounter (HOSPITAL_COMMUNITY): Payer: Self-pay

## 2019-02-05 ENCOUNTER — Other Ambulatory Visit: Payer: Self-pay

## 2019-02-05 ENCOUNTER — Inpatient Hospital Stay (HOSPITAL_COMMUNITY)
Admission: EM | Admit: 2019-02-05 | Discharge: 2019-02-13 | DRG: 564 | Disposition: A | Discharge status: Skilled Nursing Facility | Payer: Medicare Other | Source: Skilled Nursing Facility | Attending: Internal Medicine | Admitting: Internal Medicine

## 2019-02-05 DIAGNOSIS — Z885 Allergy status to narcotic agent status: Secondary | ICD-10-CM | POA: Diagnosis not present

## 2019-02-05 DIAGNOSIS — Z8619 Personal history of other infectious and parasitic diseases: Secondary | ICD-10-CM

## 2019-02-05 DIAGNOSIS — I739 Peripheral vascular disease, unspecified: Secondary | ICD-10-CM | POA: Diagnosis present

## 2019-02-05 DIAGNOSIS — M6281 Muscle weakness (generalized): Secondary | ICD-10-CM | POA: Diagnosis not present

## 2019-02-05 DIAGNOSIS — I120 Hypertensive chronic kidney disease with stage 5 chronic kidney disease or end stage renal disease: Secondary | ICD-10-CM | POA: Diagnosis present

## 2019-02-05 DIAGNOSIS — N2581 Secondary hyperparathyroidism of renal origin: Secondary | ICD-10-CM | POA: Diagnosis present

## 2019-02-05 DIAGNOSIS — G8918 Other acute postprocedural pain: Secondary | ICD-10-CM | POA: Diagnosis not present

## 2019-02-05 DIAGNOSIS — Z7401 Bed confinement status: Secondary | ICD-10-CM | POA: Diagnosis not present

## 2019-02-05 DIAGNOSIS — Z86011 Personal history of benign neoplasm of the brain: Secondary | ICD-10-CM

## 2019-02-05 DIAGNOSIS — N186 End stage renal disease: Secondary | ICD-10-CM

## 2019-02-05 DIAGNOSIS — L97529 Non-pressure chronic ulcer of other part of left foot with unspecified severity: Secondary | ICD-10-CM | POA: Diagnosis not present

## 2019-02-05 DIAGNOSIS — Z22322 Carrier or suspected carrier of Methicillin resistant Staphylococcus aureus: Secondary | ICD-10-CM | POA: Diagnosis not present

## 2019-02-05 DIAGNOSIS — Z992 Dependence on renal dialysis: Secondary | ICD-10-CM | POA: Diagnosis not present

## 2019-02-05 DIAGNOSIS — S91309A Unspecified open wound, unspecified foot, initial encounter: Secondary | ICD-10-CM | POA: Diagnosis not present

## 2019-02-05 DIAGNOSIS — R5381 Other malaise: Secondary | ICD-10-CM | POA: Diagnosis not present

## 2019-02-05 DIAGNOSIS — Y835 Amputation of limb(s) as the cause of abnormal reaction of the patient, or of later complication, without mention of misadventure at the time of the procedure: Secondary | ICD-10-CM | POA: Diagnosis present

## 2019-02-05 DIAGNOSIS — T148XXA Other injury of unspecified body region, initial encounter: Secondary | ICD-10-CM

## 2019-02-05 DIAGNOSIS — M7989 Other specified soft tissue disorders: Secondary | ICD-10-CM | POA: Diagnosis not present

## 2019-02-05 DIAGNOSIS — T8781 Dehiscence of amputation stump: Principal | ICD-10-CM | POA: Diagnosis present

## 2019-02-05 DIAGNOSIS — L089 Local infection of the skin and subcutaneous tissue, unspecified: Secondary | ICD-10-CM | POA: Diagnosis not present

## 2019-02-05 DIAGNOSIS — Z95820 Peripheral vascular angioplasty status with implants and grafts: Secondary | ICD-10-CM | POA: Diagnosis not present

## 2019-02-05 DIAGNOSIS — M2550 Pain in unspecified joint: Secondary | ICD-10-CM | POA: Diagnosis not present

## 2019-02-05 DIAGNOSIS — L03116 Cellulitis of left lower limb: Secondary | ICD-10-CM

## 2019-02-05 DIAGNOSIS — I70445 Atherosclerosis of autologous vein bypass graft(s) of the left leg with ulceration of other part of foot: Secondary | ICD-10-CM | POA: Diagnosis not present

## 2019-02-05 DIAGNOSIS — Z79899 Other long term (current) drug therapy: Secondary | ICD-10-CM

## 2019-02-05 DIAGNOSIS — H548 Legal blindness, as defined in USA: Secondary | ICD-10-CM | POA: Diagnosis present

## 2019-02-05 DIAGNOSIS — I129 Hypertensive chronic kidney disease with stage 1 through stage 4 chronic kidney disease, or unspecified chronic kidney disease: Secondary | ICD-10-CM | POA: Diagnosis not present

## 2019-02-05 DIAGNOSIS — F17210 Nicotine dependence, cigarettes, uncomplicated: Secondary | ICD-10-CM | POA: Diagnosis present

## 2019-02-05 DIAGNOSIS — D631 Anemia in chronic kidney disease: Secondary | ICD-10-CM | POA: Diagnosis present

## 2019-02-05 DIAGNOSIS — T148XXD Other injury of unspecified body region, subsequent encounter: Secondary | ICD-10-CM | POA: Diagnosis not present

## 2019-02-05 DIAGNOSIS — Z7982 Long term (current) use of aspirin: Secondary | ICD-10-CM

## 2019-02-05 DIAGNOSIS — Z89432 Acquired absence of left foot: Secondary | ICD-10-CM | POA: Diagnosis not present

## 2019-02-05 DIAGNOSIS — S98312D Complete traumatic amputation of left midfoot, subsequent encounter: Secondary | ICD-10-CM | POA: Diagnosis not present

## 2019-02-05 DIAGNOSIS — I10 Essential (primary) hypertension: Secondary | ICD-10-CM

## 2019-02-05 DIAGNOSIS — I2510 Atherosclerotic heart disease of native coronary artery without angina pectoris: Secondary | ICD-10-CM | POA: Diagnosis present

## 2019-02-05 DIAGNOSIS — R262 Difficulty in walking, not elsewhere classified: Secondary | ICD-10-CM | POA: Diagnosis not present

## 2019-02-05 DIAGNOSIS — D62 Acute posthemorrhagic anemia: Secondary | ICD-10-CM | POA: Diagnosis not present

## 2019-02-05 DIAGNOSIS — S91302D Unspecified open wound, left foot, subsequent encounter: Secondary | ICD-10-CM | POA: Diagnosis not present

## 2019-02-05 LAB — COMPREHENSIVE METABOLIC PANEL
ALT: 7 U/L (ref 0–44)
AST: 13 U/L — ABNORMAL LOW (ref 15–41)
Albumin: 2.6 g/dL — ABNORMAL LOW (ref 3.5–5.0)
Alkaline Phosphatase: 41 U/L (ref 38–126)
Anion gap: 15 (ref 5–15)
BUN: 17 mg/dL (ref 6–20)
CO2: 32 mmol/L (ref 22–32)
Calcium: 9.5 mg/dL (ref 8.9–10.3)
Chloride: 91 mmol/L — ABNORMAL LOW (ref 98–111)
Creatinine, Ser: 8.07 mg/dL — ABNORMAL HIGH (ref 0.44–1.00)
GFR calc Af Amer: 7 mL/min — ABNORMAL LOW (ref >60)
GFR calc non Af Amer: 6 mL/min — ABNORMAL LOW (ref >60)
Glucose, Bld: 118 mg/dL — ABNORMAL HIGH (ref 70–99)
Potassium: 3.9 mmol/L (ref 3.5–5.1)
Sodium: 138 mmol/L (ref 135–145)
Total Bilirubin: 0.6 mg/dL (ref 0.3–1.2)
Total Protein: 6.3 g/dL — ABNORMAL LOW (ref 6.5–8.1)

## 2019-02-05 LAB — I-STAT BETA HCG BLOOD, ED (MC, WL, AP ONLY): I-stat hCG, quantitative: <5 m[IU]/mL (ref <5)

## 2019-02-05 LAB — CBC WITH DIFFERENTIAL/PLATELET
Abs Immature Granulocytes: 0.04 10*3/uL (ref 0.00–0.07)
Basophils Absolute: 0 10*3/uL (ref 0.0–0.1)
Basophils Relative: 1 %
Eosinophils Absolute: 0.3 10*3/uL (ref 0.0–0.5)
Eosinophils Relative: 4 %
HCT: 26.1 % — ABNORMAL LOW (ref 36.0–46.0)
Hemoglobin: 8 g/dL — ABNORMAL LOW (ref 12.0–15.0)
Immature Granulocytes: 1 %
Lymphocytes Relative: 12 %
Lymphs Abs: 1 10*3/uL (ref 0.7–4.0)
MCH: 31.1 pg (ref 26.0–34.0)
MCHC: 30.7 g/dL (ref 30.0–36.0)
MCV: 101.6 fL — ABNORMAL HIGH (ref 80.0–100.0)
Monocytes Absolute: 0.6 10*3/uL (ref 0.1–1.0)
Monocytes Relative: 6 %
Neutro Abs: 6.6 10*3/uL (ref 1.7–7.7)
Neutrophils Relative %: 76 %
Platelets: 380 10*3/uL (ref 150–400)
RBC: 2.57 MIL/uL — ABNORMAL LOW (ref 3.87–5.11)
RDW: 14.3 % (ref 11.5–15.5)
WBC: 8.5 10*3/uL (ref 4.0–10.5)
nRBC: 0 % (ref 0.0–0.2)

## 2019-02-05 LAB — LACTIC ACID, PLASMA: Lactic Acid, Venous: 0.8 mmol/L (ref 0.5–1.9)

## 2019-02-05 MED ORDER — ENOXAPARIN SODIUM 40 MG/0.4ML ~~LOC~~ SOLN: 40.0000 mg | SUBCUTANEOUS | Status: DC

## 2019-02-05 MED ORDER — OXYCODONE HCL 5 MG PO TABS: 5.0000 mg | ORAL_TABLET | ORAL | Status: DC | PRN

## 2019-02-05 MED ORDER — HYDROMORPHONE HCL 1 MG/ML IJ SOLN
0.5000 mg | Freq: Once | INTRAMUSCULAR | Status: AC
  Administered 2019-02-05: 0.5 mg via INTRAVENOUS
  Filled 2019-02-05: qty 1

## 2019-02-05 MED ORDER — NICOTINE 14 MG/24HR TD PT24: 14.0000 mg | MEDICATED_PATCH | Freq: Every day | TRANSDERMAL | Status: DC | PRN

## 2019-02-05 MED ORDER — PIPERACILLIN-TAZOBACTAM 3.375 G IVPB 30 MIN
3.3750 g | Freq: Once | INTRAVENOUS | Status: AC
  Administered 2019-02-05: 3.375 g via INTRAVENOUS
  Filled 2019-02-05: qty 50

## 2019-02-05 MED ORDER — HYDROCODONE-ACETAMINOPHEN 5-325 MG PO TABS
1.0000 | ORAL_TABLET | ORAL | Status: DC | PRN
  Administered 2019-02-06: 1 via ORAL
  Administered 2019-02-06 – 2019-02-11 (×22): 2 via ORAL
  Filled 2019-02-05 (×16): qty 2
  Filled 2019-02-05: qty 1
  Filled 2019-02-05 (×7): qty 2

## 2019-02-05 MED ORDER — BISACODYL 5 MG PO TBEC
10.0000 mg | DELAYED_RELEASE_TABLET | Freq: Once | ORAL | Status: AC
  Administered 2019-02-05: 10 mg via ORAL
  Filled 2019-02-05: qty 2

## 2019-02-05 MED ORDER — SEVELAMER CARBONATE 800 MG PO TABS
1600.0000 mg | ORAL_TABLET | Freq: Two times a day (BID) | ORAL | Status: DC | PRN
  Administered 2019-02-09: 1600 mg via ORAL

## 2019-02-05 MED ORDER — TRAMADOL HCL 50 MG PO TABS
50.0000 mg | ORAL_TABLET | Freq: Four times a day (QID) | ORAL | Status: DC | PRN
  Administered 2019-02-05 – 2019-02-12 (×5): 50 mg via ORAL
  Filled 2019-02-05 (×5): qty 1

## 2019-02-05 MED ORDER — SEVELAMER CARBONATE 800 MG PO TABS
3200.0000 mg | ORAL_TABLET | Freq: Three times a day (TID) | ORAL | Status: DC
  Administered 2019-02-06 – 2019-02-13 (×21): 3200 mg via ORAL
  Filled 2019-02-05 (×21): qty 4

## 2019-02-05 MED ORDER — CARVEDILOL 25 MG PO TABS
25.0000 mg | ORAL_TABLET | Freq: Two times a day (BID) | ORAL | Status: DC
  Administered 2019-02-05 – 2019-02-13 (×14): 25 mg via ORAL
  Filled 2019-02-05 (×15): qty 1

## 2019-02-05 MED ORDER — CLONIDINE HCL 0.2 MG PO TABS
0.2000 mg | ORAL_TABLET | Freq: Three times a day (TID) | ORAL | Status: DC
  Administered 2019-02-05 – 2019-02-07 (×4): 0.2 mg via ORAL
  Filled 2019-02-05 (×5): qty 1

## 2019-02-05 MED ORDER — SODIUM CHLORIDE 0.9% FLUSH: 3.0000 mL | Freq: Once | INTRAVENOUS | Status: DC

## 2019-02-05 NOTE — ED Notes (Addendum)
IV team at bedsdie. States they are unable to get iv but will return with other equipment

## 2019-02-05 NOTE — ED Notes (Signed)
Pt states she does not make any urine

## 2019-02-05 NOTE — ED Notes (Signed)
Wound at left foot dressed with wet to dry dressing, kerlex and ace wrap.

## 2019-02-05 NOTE — ED Notes (Signed)
ABX started with 1 blood culture drawn. Provider aware.

## 2019-02-05 NOTE — ED Notes (Signed)
Vascular at bedside

## 2019-02-05 NOTE — ED Notes (Addendum)
Minimal blood return with iv placement. 1 blue top BC done per [place],

## 2019-02-05 NOTE — ED Provider Notes (Signed)
[facility] EMERGENCY DEPARTMENT Provider Note   CSN: [id number] Arrival date & time: 02/05/19  1238     History   Chief Complaint Chief Complaint  Patient presents with  . Wound Check    HPI Rebekah Burton is a 43 y.o. female.  The history is provided by the patient.  Wound Check  This is a new problem. The problem occurs constantly. The problem has been gradually worsening. Nothing aggravates the symptoms. Nothing relieves the symptoms. She has tried nothing for the symptoms. The treatment provided moderate relief.  Pt reports she had a toe amputation left foot and a left femoral pop bypass and femoral endarterectomy on 1/24.  Pt reports she was sent here from the wound center today because foot wood is open and draining and bypass incision is draining.  Pt complains of pain from leg to foot    Past Medical History:  Diagnosis Date  . Blind   . ESRD (end stage renal disease) ([place])   . Foot ulceration ([place]) 12/2018  . Hypertension   . PAD (peripheral artery disease) [facility])     Patient Active Problem List   Diagnosis Date Noted  . Post-operative pain   . Acute blood loss anemia   . S/P transmetatarsal amputation of foot, left ([place])   . Ischemic ulcer of left foot ([place]) 01/16/2019  . ESRD (end stage renal disease) ([place]) 01/16/2019  . HTN (hypertension) 01/16/2019  . 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  . PAD (peripheral artery disease) ([place]) 01/16/2019    Past Surgical History:  Procedure Laterality Date  . ABDOMINAL AORTOGRAM W/LOWER EXTREMITY Bilateral 01/17/2019   Procedure: ABDOMINAL AORTOGRAM W/LOWER EXTREMITY;  Surgeon: Waynetta Sandy, MD;  Location: [facility];  Service: Cardiovascular;  Laterality: Bilateral;  . ABDOMINAL HYSTERECTOMY    . ENDARTERECTOMY FEMORAL Left 01/18/2019   Procedure: ENDARTERECTOMY FEMORAL with Perfundaplasty;  Surgeon: Rosetta Posner, MD;  Location: [facility];  Service: Vascular;  Laterality: Left;  .  FEMORAL-POPLITEAL BYPASS GRAFT Left 01/18/2019   Procedure: Left  FEMORAL- to  Below the Knee POPLITEAL ARTERY bypass using reversed safenous vein.;  Surgeon: Rosetta Posner, MD;  Location: [facility] OR;  Service: Vascular;  Laterality: Left;  . NO PAST SURGERIES    . toe removal Right    All toes on right foot have been removed.   . TRANSMETATARSAL AMPUTATION Left 01/18/2019   Procedure: TRANSMETATARSAL AMPUTATION;  Surgeon: Rosetta Posner, MD;  Location: [facility];  Service: Vascular;  Laterality: Left;     OB History   No obstetric history on file.      Home Medications    Prior to Admission medications   Medication Sig Start Date End Date Taking? Authorizing Provider  amLODipine (NORVASC) 10 MG tablet Take 10 mg by mouth daily. 11/23/18   [provider]  aspirin EC 81 MG EC tablet Take 1 tablet (81 mg total) by mouth daily. 01/26/19   Charlynne Cousins, MD  Aspirin-Acetaminophen-Caffeine (GOODY HEADACHE PO) Take 1 packet by mouth as needed (for headaches).    [provider]  b complex-vitamin c-folic acid (NEPHRO-VITE) 0.8 MG TABS tablet Take 1 tablet by mouth every Monday, Wednesday, and Friday with hemodialysis. 11/20/18   [provider]  carvedilol (COREG) 25 MG tablet Take 25 mg by mouth 2 (two) times daily. 01/04/19   [provider]  cloNIDine (CATAPRES) 0.2 MG tablet Take 0.2 mg by mouth 3 (three) times daily. 10/16/18   [provider]  lidocaine-prilocaine (EMLA) cream Apply 1 application topically every Monday, Wednesday, and Friday with hemodialysis.  11/20/18   [provider]  oxyCODONE (OXY IR/ROXICODONE) 5 MG immediate release tablet Take 1-2 tablets (5-10 mg total) by mouth every 4 (four) hours as needed for moderate pain. 01/26/19   Charlynne Cousins, MD  RENVELA 800 MG tablet Take 1,600-3,200 mg by mouth See admin instructions. Take 3,200 mg by mouth three times a day with meals and 1,600 mg with each snack 11/20/18    [provider]    Family History Family History  Problem Relation Age of Onset  . Peripheral Artery Disease Neg Hx   . Kidney failure Neg Hx     Social History Social History   Tobacco Use  . Smoking status: Current Every Day Smoker    Packs/day: 0.50    Years: 20.00    Pack years: 10.00    Types: Cigarettes  . Smokeless tobacco: Never Used  . Tobacco comment: <1 PPD  Substance Use Topics  . Alcohol use: Never    Frequency: Never  . Drug use: Never     Allergies   Percocet [oxycodone-acetaminophen]   Review of Systems Review of Systems  Constitutional: Negative for fever.  Musculoskeletal: Positive for myalgias.  All other systems reviewed and are negative.    Physical Exam Updated Vital Signs BP 109/75   Pulse 73   Temp 99.4 F (37.4 C) (Oral)   Resp 18   SpO2 100%   Physical Exam Vitals signs and nursing note reviewed.  Constitutional:      General: She is not in acute distress.    Appearance: She is well-developed.  HENT:     Head: Normocephalic and atraumatic.  Eyes:     Conjunctiva/sclera: Conjunctivae normal.  Neck:     Musculoskeletal: Neck supple.  Cardiovascular:     Rate and Rhythm: Normal rate and regular rhythm.     Heart sounds: No murmur.  Pulmonary:     Effort: Pulmonary effort is normal. No respiratory distress.     Breath sounds: Normal breath sounds.  Abdominal:     Palpations: Abdomen is soft.     Tenderness: There is no abdominal tenderness.  Musculoskeletal:     Comments: Open wound   Skin:    General: Skin is warm and dry.  Neurological:     General: No focal deficit present.     Mental Status: She is alert.  Psychiatric:        Mood and Affect: Mood normal.      ED Treatments / Results  Labs (all labs ordered are listed, but only abnormal results are displayed) Labs Reviewed  COMPREHENSIVE METABOLIC PANEL - Abnormal; Notable for the following components:      Result Value   Chloride 91 (*)     Glucose, Bld 118 (*)    Creatinine, Ser 8.07 (*)    Total Protein 6.3 (*)    Albumin 2.6 (*)    AST 13 (*)    GFR calc non Af Amer 6 (*)    GFR calc Af Amer 7 (*)    All other components within normal limits  CBC WITH DIFFERENTIAL/PLATELET - Abnormal; Notable for the following components:   RBC 2.57 (*)    Hemoglobin 8.0 (*)    HCT 26.1 (*)    MCV 101.6 (*)    All other components within normal limits  CULTURE, BLOOD (ROUTINE X 2)  CULTURE, BLOOD (ROUTINE X  2)  LACTIC ACID, PLASMA  LACTIC ACID, PLASMA  URINALYSIS, ROUTINE W REFLEX MICROSCOPIC  I-STAT BETA HCG BLOOD, ED ([facility], [facility], [facility] ONLY)    EKG None  Radiology No results found.  Procedures Procedures (including critical care time)  Medications Ordered in ED Medications  sodium chloride flush (NS) 0.9 % injection 3 mL (has no administration in time range)  HYDROmorphone (DILAUDID) injection 0.5 mg (has no administration in time range)     Initial Impression / Assessment and Plan / ED Course  I have reviewed the triage vital signs and the nursing notes.  Pertinent labs & imaging results that were available during my care of the patient were reviewed by me and considered in my medical decision making (see chart for details).     MDM  I spoke to Vascular surgeon.   Dr. Scot Dock saw pt here.  He advised he will make recommendations for wound care.  He will have Dr. Donnetta Hutching see and they will follow.  He avised Medicine admission for iv antibiotics  I spoke to the [facility] hospitalist who will see for admission   Final Clinical Impressions(s) / ED Diagnoses   Final diagnoses:  Wound infection  Cellulitis of left foot    ED Discharge Orders    None       Sidney Ace 02/05/19 1840    Virgel Manifold, MD 02/06/19 1228

## 2019-02-05 NOTE — ED Notes (Signed)
Iv team at bedside  

## 2019-02-05 NOTE — ED Triage Notes (Addendum)
Pt from [facility] for wound check, pt had recent amputation of left foot but sutures have come undone and it is now open. Pt sent here for evaluation. Dialysis pt MWF, last session yesterday with full treatment. Pt blind. Pt a.o, nad noted

## 2019-02-05 NOTE — Progress Notes (Signed)
REASON FOR CONSULT:    Dehiscence of left transmetatarsal amputation.  The consult is requested by the emergency department.  HPI:   Rebekah Burton is a pleasant 43 y.o. female, who underwent a left common femoral artery and deep femoral artery endarterectomy with vein patch angioplasty and a left femoral to below-knee popliteal artery bypass with a vein graft in addition to a left transmetatarsal amputation by Dr. Sherren Mocha Early on 01/18/2019.  The patient was being seen by [facility] for a wound check and it was noted that her wound had separated.  For this reason she was sent to the emergency department.  The patient has end-stage renal disease and dialyzes on Monday Wednesdays and Fridays.  She denies fever or chills.  Of note she continues to smoke a half a pack per day.  Past Medical History:  Diagnosis Date  . Blind   . ESRD (end stage renal disease) ([place])   . Foot ulceration ([place]) 12/2018  . Hypertension   . PAD (peripheral artery disease) ([facility])     Family History  Problem Relation Age of Onset  . Peripheral Artery Disease Neg Hx   . Kidney failure Neg Hx     SOCIAL HISTORY: Social History   Socioeconomic History  . Marital status: Single    Spouse name: Not on file  . Number of children: Not on file  . Years of education: Not on file  . Highest education level: Not on file  Occupational History  . Not on file  Social Needs  . Financial resource strain: Not on file  . Food insecurity:    Worry: Not on file    Inability: Not on file  . Transportation needs:    Medical: Not on file    Non-medical: Not on file  Tobacco Use  . Smoking status: Current Every Day Smoker    Packs/day: 0.50    Years: 20.00    Pack years: 10.00    Types: Cigarettes  . Smokeless tobacco: Never Used  . Tobacco comment: <1 PPD  Substance and Sexual Activity  . Alcohol use: Never    Frequency: Never  . Drug use: Never  . Sexual activity: Never  Lifestyle  . Physical  activity:    Days per week: Not on file    Minutes per session: Not on file  . Stress: Not on file  Relationships  . Social connections:    Talks on phone: Not on file    Gets together: Not on file    Attends religious service: Not on file    Active member of club or organization: Not on file    Attends meetings of clubs or organizations: Not on file    Relationship status: Not on file  . Intimate partner violence:    Fear of current or ex partner: Not on file    Emotionally abused: Not on file    Physically abused: Not on file    Forced sexual activity: Not on file  Other Topics Concern  . Not on file  Social History Narrative  . Not on file    Allergies  Allergen Reactions  . Percocet [Oxycodone-Acetaminophen] Hives    Current Facility-Administered Medications  Medication Dose Route Frequency Provider Last Rate Last Dose  . HYDROmorphone (DILAUDID) injection 0.5 mg  0.5 mg Intravenous Once Sofia, Leslie K, PA-C      . sodium chloride flush (NS) 0.9 % injection 3 mL  3 mL Intravenous Once Virgel Manifold, MD  Current Outpatient Medications  Medication Sig Dispense Refill  . amLODipine (NORVASC) 10 MG tablet Take 10 mg by mouth daily.    Marland Kitchen aspirin EC 81 MG EC tablet Take 1 tablet (81 mg total) by mouth daily.    . Aspirin-Acetaminophen-Caffeine (GOODY HEADACHE PO) Take 1 packet by mouth as needed (for headaches).    Marland Kitchen b complex-vitamin c-folic acid (NEPHRO-VITE) 0.8 MG TABS tablet Take 1 tablet by mouth every Monday, Wednesday, and Friday with hemodialysis.    Marland Kitchen carvedilol (COREG) 25 MG tablet Take 25 mg by mouth 2 (two) times daily.    . cloNIDine (CATAPRES) 0.2 MG tablet Take 0.2 mg by mouth 3 (three) times daily.    Marland Kitchen lidocaine-prilocaine (EMLA) cream Apply 1 application topically every Monday, Wednesday, and Friday with hemodialysis.     Marland Kitchen oxyCODONE (OXY IR/ROXICODONE) 5 MG immediate release tablet Take 1-2 tablets (5-10 mg total) by mouth every 4 (four) hours as  needed for moderate pain. 30 tablet 0  . RENVELA 800 MG tablet Take 1,600-3,200 mg by mouth See admin instructions. Take 3,200 mg by mouth three times a day with meals and 1,600 mg with each snack    . traMADol (ULTRAM) 50 MG tablet Take 50 mg by mouth every 6 (six) hours as needed for moderate pain.      REVIEW OF SYSTEMS:  [X]  denotes positive finding, [ ]  denotes negative finding Cardiac  Comments:  Chest pain or chest pressure:    Shortness of breath upon exertion:    Short of breath when lying flat:    Irregular heart rhythm:        Vascular    Pain in calf, thigh, or hip brought on by ambulation:    Pain in feet at night that wakes you up from your sleep:     Blood clot in your veins:    Leg swelling:         Pulmonary    Oxygen at home:    Productive cough:     Wheezing:         Neurologic    Sudden weakness in arms or legs:     Sudden numbness in arms or legs:     Sudden onset of difficulty speaking or slurred speech:    Temporary loss of vision in one eye:     Problems with dizziness:         Gastrointestinal    Blood in stool:     Vomited blood:         Genitourinary    Burning when urinating:     Blood in urine:        Psychiatric    Major depression:         Hematologic    Bleeding problems:    Problems with blood clotting too easily:        Skin    Rashes or ulcers: x       Constitutional    Fever or chills:     PHYSICAL EXAM:   Vitals:   02/05/19 1715 02/05/19 1730 02/05/19 1745 02/05/19 1800  BP:  125/72 134/88 114/81  Pulse:    (!) 36  Resp: (!) 8 14 (!) 9 18  Temp:      TempSrc:      SpO2:    93%    GENERAL: The patient is a well-nourished female, in no acute distress. The vital signs are documented above. CARDIAC: There is a regular rate and rhythm.  VASCULAR: Brisk anterior tibial signal left lower extremity with Doppler. PULMONARY: There is good air exchange bilaterally without wheezing or rales. ABDOMEN: Soft and non-tender with  normal pitched bowel sounds.  MUSCULOSKELETAL: There are no major deformities or cyanosis. NEUROLOGIC: No focal weakness or paresthesias are detected. SKIN: The transmetatarsal amputation wound has separated.  DATA:    X-ray left foot: This shows cortical loss at the first metatarsal stump suspicious for osteomyelitis.   ASSESSMENT & PLAN:   NONHEALING LEFT TRANSMETATARSAL AMPUTATION STATUS POST LEFT FEMOROPOPLITEAL BYPASS: This patient presents with dehiscence of her left transmetatarsal amputation site.  She underwent a left femoral endarterectomy and femoropopliteal bypass graft approximately 2-1/2 weeks ago.  I would agree with the plans to admit her for intravenous antibiotics.  I will write for dressing changes.  I will notify Dr. Donnetta Hutching of her admission.   [name] [facility] [phone]

## 2019-02-05 NOTE — H&P (Signed)
History and Physical    PLEASE NOTE THAT DRAGON DICTATION SOFTWARE WAS USED IN THE CONSTRUCTION OF THIS NOTE.   Rebekah Burton KGM:[id number] DOB: [birth year] DOA: 02/05/2019  PCP: Patient, [name] Patient coming from: home  I have personally briefly reviewed patient's old medical records in [place]  Chief Complaint: Left foot wound dehiscence  HPI: Rebekah Burton is a 43 y.o. female with medical history significant for end-stage renal disease on hemodialysis on Monday, Wednesday, Friday, peripheral artery disease status post recent left common femoral artery and deep femoral artery endarterectomy with vein patch angioplasty and a left femoral to below-knee popliteal artery bypass with vein graft, recent left metatarsal amputation, who is admitted to [facility] on 02/05/2019 with suspected left lower extremity cellulitis associated with wound dehiscence at site of recent left transmetatarsal amputation.  Patient was recently discharged on 01/26/2019 from [facility] following hospitalization in which she underwent left common femoral artery and deep femoral artery endarterectomy with vein patch angioplasty and a left femoral to below-knee popliteal artery bypass with vein graft as well as left transmetatarsal amputation performed by Dr. Sherren Mocha Early on 01/18/19.   Over the last 2 to 3 days, the patient reports noting increased pain, redness, and inflammation at the site of the recent left transmetatarsal amputation.  She has been compliant in routinely attending wound clinic with most recent appointment occurring earlier on 02/05/2019, at which time wound care provider noted dehiscence of left transmetatarsal amputation wound, prompting recommendation for the patient present to the emergency department for further evaluation.  The patient denies any recent subjective fever, chills, or rigors.  She denies rash in any other location.   Emergency department course: Vital signs in  the emergency department were notable for the following: Temperature max 99, heart rate 71-79; blood pressure initially noted to be 113/69, which increased to 124/79 following initiation of IV antibiotics, as further described below; respiratory rate 15-19; oxygen saturation 93 to 100% on room air.  Labs performed in the ED were notable for the following: CMP notable for potassium 3.9.  CBC notable for white blood cell count of 8500 with 76% neutrophils.  Blood cultures x2 were collected prior to initiation of antibiotics. Dr. Scot Dock of vascular surgery evaluated the patient in the emergency department, and subsequently recommended admission to the hospital service for initiation of IV antibiotics, with plan for vascular surgery to continue to follow.  While still in the emergency department, the following were administered: Zosyn 3.376 g IV x1 as well as Dilaudid 0.5 mg IV x1.  Subsequently, the patient was admitted to the [place] floor for further evaluation and management of suspected left lower extremity cellulitis associated with wound dehiscence involving recent left transmetatarsal amputation.   Review of Systems: As per HPI otherwise 10 point review of systems negative.   Past Medical History:  Diagnosis Date  . Blind   . ESRD (end stage renal disease) ([place])   . Foot ulceration ([place]) 12/2018  . Hypertension   . PAD (peripheral artery disease) ([place])     Past Surgical History:  Procedure Laterality Date  . ABDOMINAL AORTOGRAM W/LOWER EXTREMITY Bilateral 01/17/2019   Procedure: ABDOMINAL AORTOGRAM W/LOWER EXTREMITY;  Surgeon: Waynetta Sandy, MD;  Location: [facility];  Service: Cardiovascular;  Laterality: Bilateral;  . ABDOMINAL HYSTERECTOMY    . ENDARTERECTOMY FEMORAL Left 01/18/2019   Procedure: ENDARTERECTOMY FEMORAL with Perfundaplasty;  Surgeon: Rosetta Posner, MD;  Location: [facility];  Service: Vascular;  Laterality: Left;  . FEMORAL-POPLITEAL BYPASS GRAFT Left  01/18/2019   Procedure: Left  FEMORAL- to  Below the Knee POPLITEAL ARTERY bypass using reversed safenous vein.;  Surgeon: Rosetta Posner, MD;  Location: [facility] OR;  Service: Vascular;  Laterality: Left;  . NO PAST SURGERIES    . toe removal Right    All toes on right foot have been removed.   . TRANSMETATARSAL AMPUTATION Left 01/18/2019   Procedure: TRANSMETATARSAL AMPUTATION;  Surgeon: Rosetta Posner, MD;  Location: [facility] OR;  Service: Vascular;  Laterality: Left;    Social History:  reports that she has been smoking cigarettes. She has a 10.00 pack-year smoking history. She has never used smokeless tobacco. She reports that she does not drink alcohol or use drugs.   Allergies  Allergen Reactions  . Percocet [Oxycodone-Acetaminophen] Hives    Family History  Problem Relation Age of Onset  . Peripheral Artery Disease Neg Hx   . Kidney failure Neg Hx      Prior to Admission medications   Medication Sig Start Date End Date Taking? Authorizing Provider  amLODipine (NORVASC) 10 MG tablet Take 10 mg by mouth daily. 11/23/18  Yes [provider]  aspirin EC 81 MG EC tablet Take 1 tablet (81 mg total) by mouth daily. 01/26/19  Yes Charlynne Cousins, MD  Aspirin-Acetaminophen-Caffeine (GOODY HEADACHE PO) Take 1 packet by mouth as needed (for headaches).   Yes [provider]  b complex-vitamin c-folic acid (NEPHRO-VITE) 0.8 MG TABS tablet Take 1 tablet by mouth every Monday, Wednesday, and Friday with hemodialysis. 11/20/18  Yes [provider]  carvedilol (COREG) 25 MG tablet Take 25 mg by mouth 2 (two) times daily. 01/04/19  Yes [provider]  cloNIDine (CATAPRES) 0.2 MG tablet Take 0.2 mg by mouth 3 (three) times daily. 10/16/18  Yes [provider]  lidocaine-prilocaine (EMLA) cream Apply 1 application topically every Monday, Wednesday, and Friday with hemodialysis.  11/20/18  Yes [provider]  oxyCODONE (OXY IR/ROXICODONE) 5 MG immediate  release tablet Take 1-2 tablets (5-10 mg total) by mouth every 4 (four) hours as needed for moderate pain. 01/26/19  Yes Charlynne Cousins, MD  RENVELA 800 MG tablet Take 1,600-3,200 mg by mouth See admin instructions. Take 3,200 mg by mouth three times a day with meals and 1,600 mg with each snack 11/20/18  Yes [provider]  traMADol (ULTRAM) 50 MG tablet Take 50 mg by mouth every 6 (six) hours as needed for moderate pain.   Yes [provider]      Objective    Physical Exam: Vitals:   02/05/19 1730 02/05/19 1745 02/05/19 1800 02/05/19 1812  BP: 125/72 134/88 114/81 114/81  Pulse:   (!) 36 76  Resp: 14 (!) 9 18 18   Temp:      TempSrc:      SpO2:   93% 100%    General: appears to be stated age; alert, oriented Skin: Dressing ordered by vascular surgery clean dry and intact Head:  AT/[place] Mouth:  Oral mucosa membranes appear moist, normal dentition Neck: supple; trachea midline Heart:  RRR; did not appreciate any M/R/G Lungs: CTAB, did not appreciate any wheezes, rales, or rhonchi Abdomen: + BS; soft, ND, NT Extremities: Left lower extremity dressing clean dry and intact, as further noted above; no muscle wasting.   Labs on Admission: I have personally reviewed following labs and imaging studies  CBC: Recent Labs  Lab 02/05/19 1308  WBC 8.5  NEUTROABS 6.6  HGB 8.0*  HCT 26.1*  MCV 101.6*  PLT 263   Basic Metabolic Panel: Recent Labs  Lab 02/05/19 1308  NA 138  K 3.9  CL 91*  CO2 32  GLUCOSE 118*  BUN 17  CREATININE 8.07*  CALCIUM 9.5   GFR: Estimated Creatinine Clearance: 10 mL/min (A) (by C-G formula based on SCr of 8.07 mg/dL (H)). Liver Function Tests: Recent Labs  Lab 02/05/19 1308  AST 13*  ALT 7  ALKPHOS 41  BILITOT 0.6  PROT 6.3*  ALBUMIN 2.6*   No results for input(s): LIPASE, AMYLASE in the last 168 hours. No results for input(s): AMMONIA in the last 168 hours. Coagulation Profile: No results for input(s): INR,  PROTIME in the last 168 hours. Cardiac Enzymes: No results for input(s): CKTOTAL, CKMB, CKMBINDEX, TROPONINI in the last 168 hours. BNP (last 3 results) No results for input(s): PROBNP in the last 8760 hours. HbA1C: No results for input(s): HGBA1C in the last 72 hours. CBG: No results for input(s): GLUCAP in the last 168 hours. Lipid Profile: No results for input(s): CHOL, HDL, LDLCALC, TRIG, CHOLHDL, LDLDIRECT in the last 72 hours. Thyroid Function Tests: No results for input(s): TSH, T4TOTAL, FREET4, T3FREE, THYROIDAB in the last 72 hours. Anemia Panel: No results for input(s): VITAMINB12, FOLATE, FERRITIN, TIBC, IRON, RETICCTPCT in the last 72 hours. Urine analysis: No results found for: COLORURINE, APPEARANCEUR, LABSPEC, [place], GLUCOSEU, [place], BILIRUBINUR, KETONESUR, PROTEINUR, UROBILINOGEN, NITRITE, LEUKOCYTESUR  Radiological Exams on Admission: Dg Foot Complete Left  Result Date: 02/05/2019 CLINICAL DATA:  Recent foot amputation, wound check, pain radiating from foot up to the thigh, end-stage renal disease on dialysis EXAM: LEFT FOOT - COMPLETE 3+ VIEW COMPARISON:  01/16/2019 FINDINGS: Interval transmetatarsal amputation LEFT foot. Bones demineralized. Soft tissue swelling at forefoot with skin clips noted. Slightly greater lucency and less well-defined margins are identified at the stump of the first metatarsal associated cortical definition suspicious for osteomyelitis. No additional areas of bone destruction are identified. Plantar and Achilles insertion calcaneal spur formation. 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 Poorly defined margins and cortical loss at the first metatarsal stump suspicious for osteomyelitis. Electronically Signed   By: Lavonia Dana M.D.   On: 02/05/2019 16:54     Assessment/Plan   Irving Lubbers is a 43 y.o. female with medical history significant for end-stage renal disease on hemodialysis  on Monday, Wednesday, Friday, peripheral artery disease status post recent left common femoral artery and deep femoral artery endarterectomy with vein patch angioplasty and a left femoral to below-knee popliteal artery bypass with vein graft, recent left metatarsal amputation, who is admitted to [facility] on 02/05/2019 with suspected left lower extremity cellulitis associated with wound dehiscence at site of recent left transmetatarsal amputation.   Principal Problem:   Cellulitis of left lower extremity Active Problems:   ESRD (end stage renal disease) (HCC)   HTN (hypertension)   PAD (peripheral artery disease) (HCC)    #) Cellulitis of left lower extremity: Diagnosis on the basis of left foot pain, erythema, swelling, which appears associated with wound dehiscence involving recent left transmetatarsal amputation as suspected portal of entry.  The patient was evaluated by vascular surgery in the emergency department this evening, with ensuing recommendations for admission to the hospitalist service for initiation of IV antibiotics, and plan for vascular surgery to further follow over the course of this hospitalization.  Of note, at time of presentation, patient does not  possess any sirs criteria, and therefore is not considered septic at this time.  Emergency department this evening she received a dose of Zosyn following collection of blood cultures x2.  Plan: Continue Zosyn.  We will follow-up results blood cultures x2 collected in the ED this evening.  Repeat CBC with differential in the morning.  Continue outpatient PRN oxycodone for pain control.  Vascular surgery to follow, as above.  Dressing changes, per orders entered by vascular surgery.     #) End-stage renal disease: On hemodialysis on a Monday, Wednesday, Friday schedule, with his recent hemodialysis occurring on Monday, 02/04/2019.  No clinical or laboratory evidence for emergent overnight hemodialysis, but the patient will  be due for her regularly scheduled hemodialysis on Wednesday, 02/06/2019.  Plan: Will need to be set up with hemodialysis to occur on Wednesday, 02/06/2019.  Will repeat BMP in the morning.  Continue home Ren[place]a.      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  Plan: Counseled the patient on the importance of complete smoking discontinuation.  I have ordered PRN nicotine patch restrains hospitalization.    #) Hypertension: On Coreg, clonidine, and amlodipine as an outpatient.  Blood pressures in the ED noted to be normotensive.  Plan: Overnight, we will continue two thirds of the patient's home antihypertensive regimen, while holding amlodipine in the setting of presenting infectious process. Close monitoring of subsequent blood pressures.   Code Status: full Family Communication: None Disposition Plan:  Per Rounding Team Consults called: Dr. Scot Dock of vascular surgery Admission status: Inpatient; MedSurg   PLEASE NOTE THAT DRAGON DICTATION SOFTWARE WAS USED IN THE CONSTRUCTION OF THIS NOTE.   [place] Pager [phone] From [place].   Otherwise, please contact night-coverage  [email or web address] Password [email or web address]  02/05/2019, 6:42 PM

## 2019-02-05 NOTE — ED Notes (Signed)
Wound at left foot noted with open areeas that appear to have sutures dehisced. Pt states she requires iv team for iv placement.

## 2019-02-06 ENCOUNTER — Encounter (HOSPITAL_COMMUNITY): Payer: Self-pay | Admitting: General Practice

## 2019-02-06 DIAGNOSIS — T148XXA Other injury of unspecified body region, initial encounter: Secondary | ICD-10-CM

## 2019-02-06 DIAGNOSIS — L089 Local infection of the skin and subcutaneous tissue, unspecified: Secondary | ICD-10-CM

## 2019-02-06 LAB — BASIC METABOLIC PANEL
Anion gap: 9 (ref 5–15)
BUN: 23 mg/dL — ABNORMAL HIGH (ref 6–20)
CO2: 33 mmol/L — ABNORMAL HIGH (ref 22–32)
Calcium: 9.5 mg/dL (ref 8.9–10.3)
Chloride: 96 mmol/L — ABNORMAL LOW (ref 98–111)
Creatinine, Ser: 9.3 mg/dL — ABNORMAL HIGH (ref 0.44–1.00)
GFR calc Af Amer: 6 mL/min — ABNORMAL LOW (ref >60)
GFR calc non Af Amer: 5 mL/min — ABNORMAL LOW (ref >60)
Glucose, Bld: 84 mg/dL (ref 70–99)
Potassium: 3.5 mmol/L (ref 3.5–5.1)
Sodium: 138 mmol/L (ref 135–145)

## 2019-02-06 LAB — MAGNESIUM: Magnesium: 2.2 mg/dL (ref 1.7–2.4)

## 2019-02-06 LAB — CBC
HCT: 27.8 % — ABNORMAL LOW (ref 36.0–46.0)
Hemoglobin: 8.4 g/dL — ABNORMAL LOW (ref 12.0–15.0)
MCH: 30 pg (ref 26.0–34.0)
MCHC: 30.2 g/dL (ref 30.0–36.0)
MCV: 99.3 fL (ref 80.0–100.0)
Platelets: 391 10*3/uL (ref 150–400)
RBC: 2.8 MIL/uL — ABNORMAL LOW (ref 3.87–5.11)
RDW: 14.2 % (ref 11.5–15.5)
WBC: 7.3 10*3/uL (ref 4.0–10.5)
nRBC: 0 % (ref 0.0–0.2)

## 2019-02-06 MED ORDER — VANCOMYCIN HCL IN DEXTROSE 1-5 GM/200ML-% IV SOLN
1000.0000 mg | INTRAVENOUS | Status: DC
  Administered 2019-02-08 – 2019-02-13 (×3): 1000 mg via INTRAVENOUS
  Filled 2019-02-06 (×3): qty 200

## 2019-02-06 MED ORDER — HEPARIN SODIUM (PORCINE) 1000 UNIT/ML IJ SOLN
INTRAMUSCULAR | Status: AC
  Filled 2019-02-06: qty 2

## 2019-02-06 MED ORDER — NICOTINE 21 MG/24HR TD PT24
21.0000 mg | MEDICATED_PATCH | Freq: Every day | TRANSDERMAL | Status: DC
  Filled 2019-02-06 (×5): qty 1

## 2019-02-06 MED ORDER — SENNOSIDES-DOCUSATE SODIUM 8.6-50 MG PO TABS
3.0000 | ORAL_TABLET | Freq: Two times a day (BID) | ORAL | Status: DC
  Administered 2019-02-06 – 2019-02-13 (×3): 3 via ORAL
  Filled 2019-02-06 (×12): qty 3

## 2019-02-06 MED ORDER — POLYETHYLENE GLYCOL 3350 17 G PO PACK
17.0000 g | PACK | Freq: Once | ORAL | Status: AC
  Administered 2019-02-06: 17 g via ORAL

## 2019-02-06 MED ORDER — VANCOMYCIN HCL 10 G IV SOLR
2000.0000 mg | Freq: Once | INTRAVENOUS | Status: AC
  Administered 2019-02-06: 2000 mg via INTRAVENOUS
  Filled 2019-02-06 (×2): qty 2000

## 2019-02-06 MED ORDER — POLYETHYLENE GLYCOL 3350 17 G PO PACK
17.0000 g | PACK | Freq: Every day | ORAL | Status: DC
  Administered 2019-02-06 – 2019-02-13 (×3): 17 g via ORAL
  Filled 2019-02-06 (×7): qty 1

## 2019-02-06 MED ORDER — SORBITOL 70 % SOLN
30.0000 mL | Status: DC | PRN
  Filled 2019-02-06: qty 30

## 2019-02-06 MED ORDER — DOCUSATE SODIUM 100 MG PO CAPS
100.0000 mg | ORAL_CAPSULE | Freq: Two times a day (BID) | ORAL | Status: DC
  Administered 2019-02-06 – 2019-02-13 (×6): 100 mg via ORAL
  Filled 2019-02-06 (×12): qty 1

## 2019-02-06 MED ORDER — SODIUM CHLORIDE 0.9 % IV SOLN
2.0000 g | INTRAVENOUS | Status: DC
  Administered 2019-02-06 – 2019-02-11 (×3): 2 g via INTRAVENOUS
  Filled 2019-02-06 (×6): qty 2

## 2019-02-06 NOTE — Progress Notes (Signed)
Pharmacy Antibiotic Note  Rebekah Burton is a 43 y.o. female admitted on 02/05/2019 with suspected left lower extremity cellulitis associated with wound dehiscence at site of recent left transmetatarsal amputation.  Pharmacy has been consulted for Vancomycin and cefepime dosing.  Plan: Cefepime 2 gm after HD MWF Vancomycin 2000 mg IV after HD today, then 1000 mg IV after HD MWF Vanc levels as needed Monitor for clinical course, de-escalation and LOT.   Height: 5\' 4"  (162.6 cm) Weight: 194 lb 14.2 oz (88.4 kg) IBW/kg (Calculated) : 54.7  Temp (24hrs), Avg:98.6 F (37 C), Min:98.6 F (37 C), Max:98.6 F (37 C)  Recent Labs  Lab 02/05/19 1306 02/05/19 1308 02/06/19 0328  WBC  --  8.5 7.3  CREATININE  --  8.07* 9.30*  LATICACIDVEN 0.8  --   --     Estimated Creatinine Clearance: 8.7 mL/min (A) (by C-G formula based on SCr of 9.3 mg/dL (H)).    Allergies  Allergen Reactions  . Percocet [Oxycodone-Acetaminophen] Hives    Antimicrobials this admission: 2/12 cefepime >>  2/12 vancomycin >>    Microbiology results: 2/12 BCx: NGTD   Rebekah Burton A. Levada Dy, PharmD, [place] Pager: [phone] Please utilize Amion for appropriate phone number to reach the unit pharmacist ([place])    02/06/2019 1:39 PM

## 2019-02-06 NOTE — Progress Notes (Signed)
PROGRESS NOTE                                                                                                                                                                                                             Patient Demographics:    Rebekah Burton, is a 43 y.o. female, DOB - 23-Aug-1976, UJW:[id number]  Admit date - 02/05/2019   Admitting Physician Rhetta Mura, DO  Outpatient Primary MD for the patient is Patient, [name]  LOS - 1   Chief Complaint  Patient presents with  . Wound Check       Brief Narrative    43 y.o. female  with a ESRD, on dialysis MWF, CAD, hypertension, blind, with recent hospitalization secondary to gangrene of foot, status post revascularization of left lower extremity, with transmetatarsal amputation, , patient was admitted for wound dehiscence and surrounding cellulitis .     Subjective:    Rebekah Burton today has any fever or chills, reports pain in left foot.    Assessment  & Plan :    Principal Problem:   Cellulitis of left lower extremity Active Problems:   ESRD (end stage renal disease) (HCC)   HTN (hypertension)   PAD (peripheral artery disease) (HCC)     Left wound dehiscence with surrounding cellulitis  -Patient with recent transmetatarsal amputation, vascular surgery input greatly appreciated, will await further recommendation if any intervention is being planned . -Continue with empiric antibiotic coverage, her recent wound cultures showing Pseudomonas/E. coli, continue cefepime, she is MRSA PCR positive, I will continue with vancomycin for next 24 to 48 hours . -follow on  blood cultures    End-stage renal disease -Consulted, dialysis per renal  Anemia of ESRD -Hemoglobin  at baseline, transfuse as needed for hemoglobin less than 8  Tobacco abuse -We will start a nicotine patch   Hypertension -Pressure acceptable, continue with home meds Coreg, clonidine, continue to  hold amlodipine  Code Status : Full  Family Communication  : None at bedside  Disposition Plan  : Pending further work up  Barriers For Discharge : Patient remains on IV antibiotics will need further evaluation and management by vascular surgery  Consults  :  Vascular, renal  Procedures  : None  DVT Prophylaxis  :  Berkley heparin  Lab Results  Component Value Date  PLT 391 02/06/2019    Antibiotics  :    Anti-infectives (From admission, onward)   Start     Dose/Rate Route Frequency Ordered Stop   02/05/19 1830  piperacillin-tazobactam (ZOSYN) IVPB 3.375 g     3.375 g 100 mL/hr over 30 Minutes Intravenous  Once 02/05/19 1820 02/05/19 1940        Objective:   Vitals:   02/05/19 1900 02/05/19 1915 02/05/19 2000 02/06/19 1055  BP: 105/67 123/69 135/85   Pulse: 77 72 80   Resp: 11 (!) 9 18   Temp:   98.6 F (37 C)   TempSrc:   Oral   SpO2: 98% 100% 100%   Weight:   88.4 kg 88.4 kg  Height:    5\' 4"  (1.626 m)    Wt Readings from Last 3 Encounters:  02/06/19 88.4 kg  01/25/19 88.1 kg  07/17/18 93.9 kg     Intake/Output Summary (Last 24 hours) at 02/06/2019 1343 Last data filed at 02/06/2019 1305 Gross per 24 hour  Intake 550 ml  Output -  Net 550 ml     Physical Exam  Awake Alert, Oriented X 3, No new F.N deficits, Normal affect, patient is legally blind Symmetrical Chest wall movement, Good air movement bilaterally, CTAB RRR,No Gallops,Rubs or new Murmurs, No Parasternal Heave +ve B.Sounds, Abd Soft, No tenderness,  No rebound - guarding or rigidity. Left lower extremity dressing looks clean and intact, wounds from recent transition surgery healing nicely, there is a small area millimeters left inner thigh bandaged with minimal amount of blood, the right transmetatarsal amputation     Data Review:    CBC Recent Labs  Lab 02/05/19 1308 02/06/19 0328  WBC 8.5 7.3  HGB 8.0* 8.4*  HCT 26.1* 27.8*  PLT 380 391  MCV 101.6* 99.3  MCH 31.1 30.0  MCHC  30.7 30.2  RDW 14.3 14.2  LYMPHSABS 1.0  --   MONOABS 0.6  --   EOSABS 0.3  --   BASOSABS 0.0  --     Chemistries  Recent Labs  Lab 02/05/19 1308 02/06/19 0328  NA 138 138  K 3.9 3.5  CL 91* 96*  CO2 32 33*  GLUCOSE 118* 84  BUN 17 23*  CREATININE 8.07* 9.30*  CALCIUM 9.5 9.5  MG  --  2.2  AST 13*  --   ALT 7  --   ALKPHOS 41  --   BILITOT 0.6  --    ------------------------------------------------------------------------------------------------------------------ No results for input(s): CHOL, HDL, LDLCALC, TRIG, CHOLHDL, LDLDIRECT in the last 72 hours.  Lab Results  Component Value Date   HGBA1C 5.2 01/16/2019   ------------------------------------------------------------------------------------------------------------------ No results for input(s): TSH, T4TOTAL, T3FREE, THYROIDAB in the last 72 hours.  Invalid input(s): FREET3 ------------------------------------------------------------------------------------------------------------------ No results for input(s): VITAMINB12, FOLATE, FERRITIN, TIBC, IRON, RETICCTPCT in the last 72 hours.  Coagulation profile No results for input(s): INR, PROTIME in the last 168 hours.  No results for input(s): DDIMER in the last 72 hours.  Cardiac Enzymes No results for input(s): CKMB, TROPONINI, MYOGLOBIN in the last 168 hours.  Invalid input(s): CK ------------------------------------------------------------------------------------------------------------------ No results found for: BNP  Inpatient Medications  Scheduled Meds: . carvedilol  25 mg Oral BID WC  . cloNIDine  0.2 mg Oral TID  . docusate sodium  100 mg Oral BID  . heparin      . polyethylene glycol  17 g Oral Daily  . senna-docusate  3 tablet Oral BID  . sevelamer carbonate  3,200 mg Oral TID WC   Continuous Infusions: PRN Meds:.HYDROcodone-acetaminophen, nicotine, sevelamer carbonate, sorbitol, traMADol  Micro Results Recent Results (from the past  240 hour(s))  Blood culture (routine x 2)     Status: None (Preliminary result)   Collection Time: 02/05/19  6:17 PM  Result Value Ref Range Status   Specimen Description SITE NOT SPECIFIED  Final   Special Requests   Final    BOTTLES DRAWN AEROBIC ONLY Blood Culture results may not be optimal due to an inadequate volume of blood received in culture bottles   Culture   Final    NO GROWTH < 12 HOURS Performed at [facility] [address] [place], [place] [address]    Report Status PENDING  Incomplete  Blood culture (routine x 2)     Status: None (Preliminary result)   Collection Time: 02/05/19  8:04 PM  Result Value Ref Range Status   Specimen Description BLOOD RIGHT HAND  Final   Special Requests   Final    BOTTLES DRAWN AEROBIC ONLY Blood Culture adequate volume   Culture   Final    NO GROWTH < 12 HOURS Performed at [facility], [place] [address], [place], [place] [address]    Report Status PENDING  Incomplete    Radiology Reports Dg Foot Complete Left  Result Date: 02/05/2019 CLINICAL DATA:  Recent foot amputation, wound check, pain radiating from foot up to the thigh, end-stage renal disease on dialysis EXAM: LEFT FOOT - COMPLETE 3+ VIEW COMPARISON:  01/16/2019 FINDINGS: Interval transmetatarsal amputation LEFT foot. Bones demineralized. Soft tissue swelling at forefoot with skin clips noted. Slightly greater lucency and less well-defined margins are identified at the stump of the first metatarsal associated cortical definition suspicious for osteomyelitis. No additional areas of bone destruction are identified. Plantar and Achilles insertion calcaneal spur formation. 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 Poorly defined margins and cortical loss at the first metatarsal stump suspicious for osteomyelitis. Electronically Signed   By: Lavonia Dana M.D.   On: 02/05/2019 16:54   Dg Foot Complete Left  Result  Date: 01/16/2019 CLINICAL DATA:  Left foot wound with black and 3rd through 5th toes. Wound present for 2 months. End-stage renal disease. EXAM: LEFT FOOT - COMPLETE 3+ VIEW COMPARISON:  Radiographs 01/15/2019. FINDINGS: Periarticular osteopenia again noted at the 2nd through 5th metatarsophalangeal joints without cortical destruction. There is no evidence of acute fracture or dislocation. There is stable spurring along the lateral base of the 1st proximal phalanx. There are no erosive changes. Forefoot soft tissue swelling appears stable without radiopaque foreign body or soft tissue emphysema. Calcaneal spurring and diffuse vascular calcifications are noted. IMPRESSION: Stable radiographs compared with prior study from yesterday. No radiographic evidence of osteomyelitis. Electronically Signed   By: Richardean Sale M.D.   On: 01/16/2019 19:35   Dg Foot Complete Left  Result Date: 01/15/2019 CLINICAL DATA:  Nonhealing wound on dorsal side of left foot. EXAM: LEFT FOOT - COMPLETE 3+ VIEW COMPARISON:  None. FINDINGS: Mild diffuse soft tissue swelling. There is periarticular osteopenia at the second through fifth MTP joints. No acute fractures, dislocations are areas of bone erosion identified. Posterior and plantar calcaneal heel spurs. IMPRESSION: 1. Soft tissue swelling. 2. No acute bone abnormality. Electronically Signed   By: Kerby Moors M.D.   On: 01/15/2019 20:07   Vas [place] Abi With/wo Tbi  Result Date: 01/22/2019 LOWER EXTREMITY DOPPLER STUDY Indications: Peripheral  artery disease.  Limitations: patient positioning and pain tolerance Performing Technologist: Abram Sander RVS  Examination Guidelines: A complete evaluation includes at minimum, Doppler waveform signals and systolic blood pressure reading at the level of bilateral brachial, anterior tibial, and posterior tibial arteries, when vessel segments are accessible. Bilateral testing is considered an integral part of a complete examination.  Photoelectric Plethysmograph (PPG) waveforms and toe systolic pressure readings are included as required and additional duplex testing as needed. Limited examinations for reoccurring indications may be performed as noted.  ABI Findings: +--------+------------------+-----+---------+--------+ Right   Rt Pressure (mmHg)IndexWaveform Comment  +--------+------------------+-----+---------+--------+ Brachial130                    triphasic         +--------+------------------+-----+---------+--------+ PTA     255               1.96 biphasic          +--------+------------------+-----+---------+--------+ DP      255               1.96 biphasic          +--------+------------------+-----+---------+--------+ +--------+------------------+-----+-------------------+------------------------+ Left    Lt Pressure (mmHg)IndexWaveform           Comment                  +--------+------------------+-----+-------------------+------------------------+ Brachial                                          unable to obtain due to                                                    fistula                  +--------+------------------+-----+-------------------+------------------------+ PTA     90                0.69 dampened monophasic                         +--------+------------------+-----+-------------------+------------------------+ DP                             dampened monophasicunable to obtain                                                           pressure due to pain                                                       tolerance                +--------+------------------+-----+-------------------+------------------------+ +-------+-----------+-----------+------------+------------+ ABI/TBIToday's ABIToday's TBIPrevious ABIPrevious TBI +-------+-----------+-----------+------------+------------+ Right  1.96                                            +-------+-----------+-----------+------------+------------+  Left   0.69                                           +-------+-----------+-----------+------------+------------+  Summary: Right: Resting right ankle-brachial index indicates noncompressible right lower extremity arteries. Left: Resting left ankle-brachial index indicates moderate left lower extremity arterial disease.  *See table(s) above for measurements and observations.  Electronically signed by Ruta Hinds MD on 01/22/2019 at 11:49:53 AM.   Final    Vas [place] Lower Extremity Arterial Duplex  Result Date: 01/16/2019 LOWER EXTREMITY ARTERIAL DUPLEX STUDY Indications: Peripheral artery disease, and Left non-healing foot ulcer.  Vascular Interventions: Right Fem-BK Pop BPG placed 2018 per patient at outside                         facility. Current ABI:            Unknown Performing Technologist: Caralee Ates BA, RVT, RDMS  Examination Guidelines: A complete evaluation includes B-mode imaging, spectral Doppler, color Doppler, and power Doppler as needed of all accessible portions of each vessel. Bilateral testing is considered an integral part of a complete examination. Limited examinations for reoccurring indications may be performed as noted.  Right Duplex Findings: +-----------+--------+-----+--------+----------+--------------+            PSV cm/sRatioStenosisWaveform  Comments       +-----------+--------+-----+--------+----------+--------------+ ATA Distal 56                   monophasic               +-----------+--------+-----+--------+----------+--------------+ PTA Mid    80                   biphasic                 +-----------+--------+-----+--------+----------+--------------+ PTA Distal 85                   monophasic               +-----------+--------+-----+--------+----------+--------------+ PERO Distal                               Not visualized  +-----------+--------+-----+--------+----------+--------------+  Right Graft #1: Femoral artery - Below knee popliteal artery +------------------+--------+--------+---------+------------+                   PSV cm/sStenosisWaveform Comments     +------------------+--------+--------+---------+------------+ Inflow            65              triphasic             +------------------+--------+--------+---------+------------+ Prox Anastomosis  121             triphasic             +------------------+--------+--------+---------+------------+ Proximal Graft    136             triphasic             +------------------+--------+--------+---------+------------+ Mid Graft         82              triphasic             +------------------+--------+--------+---------+------------+ Distal Graft      111  triphasicValves noted +------------------+--------+--------+---------+------------+ Distal Anastomosis78              triphasic             +------------------+--------+--------+---------+------------+ Outflow           74              triphasic             +------------------+--------+--------+---------+------------+  Left Duplex Findings: +-----------+--------+-----+---------------+-------------------+--------+            PSV cm/sRatioStenosis       Waveform           Comments +-----------+--------+-----+---------------+-------------------+--------+ CFA Distal 221          50-74% stenosismonophasic                  +-----------+--------+-----+---------------+-------------------+--------+ DFA        259          50-74% stenosismonophasic                  +-----------+--------+-----+---------------+-------------------+--------+ SFA Prox   21                                                      +-----------+--------+-----+---------------+-------------------+--------+ SFA Mid                 occluded                                    +-----------+--------+-----+---------------+-------------------+--------+ SFA Distal 11                          monophasic                  +-----------+--------+-----+---------------+-------------------+--------+ POP Prox   19                          monophasic                  +-----------+--------+-----+---------------+-------------------+--------+ POP Mid    18                          monophasic                  +-----------+--------+-----+---------------+-------------------+--------+ POP Distal 15                          monophasic                  +-----------+--------+-----+---------------+-------------------+--------+ ATA Distal 15                          monophasic                  +-----------+--------+-----+---------------+-------------------+--------+ PTA Prox   16                          monophasic                  +-----------+--------+-----+---------------+-------------------+--------+ PTA Mid    16  monophasic                  +-----------+--------+-----+---------------+-------------------+--------+ PTA Distal 21                          monophasic                  +-----------+--------+-----+---------------+-------------------+--------+ PERO Prox  12                          dampened monophasic         +-----------+--------+-----+---------------+-------------------+--------+ PERO Mid   9                           monophasic                  +-----------+--------+-----+---------------+-------------------+--------+ PERO Distal12                          monophasic                  +-----------+--------+-----+---------------+-------------------+--------+  Findings reported to Vivien Rota at Dr. Dellia Nims at 4:11 pm.  Summary: Right Graft(s): Patent right femoral - below knee popliteal BPG without evidence of stenosis. Left: 50-74% stenosis noted in the common femoral artery. 50-74% stenosis noted in the  deep femoral artery. Total occlusion noted in the superficial femoral artery.  See table(s) above for measurements and observations. Electronically signed by Curt Jews MD on 01/16/2019 at 8:13:50 AM.    Final    Phillips Climes M.D on 02/06/2019 at 1:43 PM  Between 7am to 7pm - Pager - [phone]  After 7pm go to [email or web address] - password [facility]  [facility] -  Office  [phone]

## 2019-02-06 NOTE — Plan of Care (Signed)
  Problem: Education: Goal: Knowledge of General Education information will improve Description Including pain rating scale, medication(s)/side effects and non-pharmacologic comfort measures Outcome: Progressing   Problem: Nutrition: Goal: Adequate nutrition will be maintained Outcome: Progressing   Problem: Elimination: Goal: Will not experience complications related to urinary retention Outcome: Progressing   Problem: Pain Managment: Goal: General experience of comfort will improve Outcome: Progressing   Problem: Safety: Goal: Ability to remain free from injury will improve Outcome: Progressing

## 2019-02-06 NOTE — Progress Notes (Signed)
Brief note regarding plan, with full H&P to follow:   Rebekah Burton is a 43 y.o. female with medical history significant for end-stage renal disease on hemodialysis on Monday, Wednesday, Friday, peripheral artery disease status post recent left common femoral artery and deep femoral artery endarterectomy with vein patch angioplasty and a left femoral to below-knee popliteal artery bypass with vein graft, recent left metatarsal amputation, who is admitted to [facility] on 02/05/2019 with suspected left lower extremity cellulitis associated with wound dehiscence at site of recent left transmetatarsal amputation.   #) Cellulitis of left lower extremity: Diagnosis on the basis of left foot pain, erythema, swelling, which appears associated with wound dehiscence involving recent left transmetatarsal amputation as suspected portal of entry.  The patient was evaluated by vascular surgery in the emergency department this evening, with ensuing recommendations for admission to the hospitalist service for initiation of IV antibiotics, and plan for vascular surgery to further follow over the course of this hospitalization.  Of note, at time of presentation, patient does not possess any sirs criteria, and therefore is not considered septic at this time.  Emergency department this evening she received a dose of Zosyn following collection of blood cultures x2.  Plan: Continue Zosyn.  We will follow-up results blood cultures x2 collected in the ED this evening.  Repeat CBC with differential in the morning.  Continue outpatient PRN oxycodone for pain control.  Vascular surgery to follow, as above.  Dressing changes, per orders entered by vascular surgery.     Babs Bertin, DO Hospitalist

## 2019-02-06 NOTE — Consult Note (Addendum)
[facility] Renal Consultation Note  Indication for Consultation:  Management of ESRD/hemodialysis; anemia, hypertension/volume and secondary hyperparathyroidism  HPI: Rebekah Burton is a 43 y.o. female with ESRD  2//2  HTN. Transferred from [facility]) on HD 10 years prior to transfer 06/2018 [place] unit , GERD, CAD, Hx pseudotumor cerebrii (s/p treatment, c/b blindness), PVD (s/p R TMA),  Hx hysterectomy, Tobacco use [facility] 1/22-01/26/19: PVD- L foot ulcer s/p fem-pop bypass and  L TMA 01/18/19.   She has been compliant with OP  HD MWF leaving below edw recently . Has decreased appetite with L foot pain / redness,  Denies CP, SOB , N/V/D .  Admitted with Cellulitis of left lower extremity. Noted Dr. Scot Dock / VVS evaluated patient in ER  recommended admission to the hospital service for initiation of IV antibiotics, with plan for vascular surgery to continue to follow.  We are consulted for ESRD /HD issues  with hd planned today .       Past Medical History:  Diagnosis Date  . Blind   . ESRD (end stage renal disease) ([place])   . Foot ulceration ([place]) 12/2018  . Hypertension   . PAD (peripheral artery disease) ([place])     Past Surgical History:  Procedure Laterality Date  . ABDOMINAL AORTOGRAM W/LOWER EXTREMITY Bilateral 01/17/2019   Procedure: ABDOMINAL AORTOGRAM W/LOWER EXTREMITY;  Surgeon: Waynetta Sandy, MD;  Location: [place] [facility];  Service: Cardiovascular;  Laterality: Bilateral;  . ABDOMINAL HYSTERECTOMY    . ENDARTERECTOMY FEMORAL Left 01/18/2019   Procedure: ENDARTERECTOMY FEMORAL with Perfundaplasty;  Surgeon: Rosetta Posner, MD;  Location: [place];  Service: Vascular;  Laterality: Left;  . FEMORAL-POPLITEAL BYPASS GRAFT Left 01/18/2019   Procedure: Left  FEMORAL- to  Below the Knee POPLITEAL ARTERY bypass using reversed safenous vein.;  Surgeon: Rosetta Posner, MD;  Location: [facility] OR;  Service: Vascular;  Laterality: Left;  . toe removal Right    All  toes on right foot have been removed.   . TRANSMETATARSAL AMPUTATION Left 01/18/2019   Procedure: TRANSMETATARSAL AMPUTATION;  Surgeon: Rosetta Posner, MD;  Location: [facility] OR;  Service: Vascular;  Laterality: Left;      Family History  Problem Relation Age of Onset  . Peripheral Artery Disease Neg Hx   . Kidney failure Neg Hx       reports that she has been smoking cigarettes. She has a 10.00 pack-year smoking history. She has never used smokeless tobacco. She reports that she does not drink alcohol or use drugs.   Allergies  Allergen Reactions  . Percocet [Oxycodone-Acetaminophen] Hives    Prior to Admission medications   Medication Sig Start Date End Date Taking? Authorizing Provider  amLODipine (NORVASC) 10 MG tablet Take 10 mg by mouth daily. 11/23/18  Yes [provider]  aspirin EC 81 MG EC tablet Take 1 tablet (81 mg total) by mouth daily. 01/26/19  Yes Charlynne Cousins, MD  Aspirin-Acetaminophen-Caffeine (GOODY HEADACHE PO) Take 1 packet by mouth as needed (for headaches).   Yes [provider]  b complex-vitamin c-folic acid (NEPHRO-VITE) 0.8 MG TABS tablet Take 1 tablet by mouth every Monday, Wednesday, and Friday with hemodialysis. 11/20/18  Yes [provider]  carvedilol (COREG) 25 MG tablet Take 25 mg by mouth 2 (two) times daily. 01/04/19  Yes [provider]  cloNIDine (CATAPRES) 0.2 MG tablet Take 0.2 mg by mouth 3 (three) times daily. 10/16/18  Yes [provider]  lidocaine-prilocaine (EMLA)  cream Apply 1 application topically every Monday, Wednesday, and Friday with hemodialysis.  11/20/18  Yes [provider]  oxyCODONE (OXY IR/ROXICODONE) 5 MG immediate release tablet Take 1-2 tablets (5-10 mg total) by mouth every 4 (four) hours as needed for moderate pain. 01/26/19  Yes Charlynne Cousins, MD  RENVELA 800 MG tablet Take 1,600-3,200 mg by mouth See admin instructions. Take 3,200 mg by mouth three times a day with  meals and 1,600 mg with each snack 11/20/18  Yes [provider]  traMADol (ULTRAM) 50 MG tablet Take 50 mg by mouth every 6 (six) hours as needed for moderate pain.   Yes [provider]     Anti-infectives (From admission, onward)   Start     Dose/Rate Route Frequency Ordered Stop   02/05/19 1830  piperacillin-tazobactam (ZOSYN) IVPB 3.375 g     3.375 g 100 mL/hr over 30 Minutes Intravenous  Once 02/05/19 1820 02/05/19 1940      Results for orders placed or performed during the hospital encounter of 02/05/19 (from the past 48 hour(s))  I-Stat beta hCG blood, ED     Status: None   Collection Time: 02/05/19  1:02 PM  Result Value Ref Range   I-stat hCG, quantitative <5.0 <5 mIU/mL   Comment 3            Comment:   GEST. AGE      CONC.  (mIU/mL)   <=1 WEEK        5 - 50     2 WEEKS       50 - 500     3 WEEKS       100 - 10,000     4 WEEKS     1,000 - 30,000        FEMALE AND NON-PREGNANT FEMALE:     LESS THAN 5 mIU/mL   Lactic acid, plasma     Status: None   Collection Time: 02/05/19  1:06 PM  Result Value Ref Range   Lactic Acid, Venous 0.8 0.5 - 1.9 mmol/L    Comment: Performed at [facility], [place] [address] [place], [place] [address]  Comprehensive metabolic panel     Status: Abnormal   Collection Time: 02/05/19  1:08 PM  Result Value Ref Range   Sodium 138 135 - 145 mmol/L   Potassium 3.9 3.5 - 5.1 mmol/L   Chloride 91 (L) 98 - 111 mmol/L   CO2 32 22 - 32 mmol/L   Glucose, Bld 118 (H) 70 - 99 mg/dL   BUN 17 6 - 20 mg/dL   Creatinine, Ser 8.07 (H) 0.44 - 1.00 mg/dL   Calcium 9.5 8.9 - 10.3 mg/dL   Total Protein 6.3 (L) 6.5 - 8.1 g/dL   Albumin 2.6 (L) 3.5 - 5.0 g/dL   AST 13 (L) 15 - 41 U/L   ALT 7 0 - 44 U/L   Alkaline Phosphatase 41 38 - 126 U/L   Total Bilirubin 0.6 0.3 - 1.2 mg/dL   GFR calc non Af Amer 6 (L) >60 mL/min   GFR calc Af Amer 7 (L) >60 mL/min   Anion gap 15 5 - 15    Comment: Performed at [facility], [place]  [address], [place], [place] [address]  CBC with Differential     Status: Abnormal   Collection Time: 02/05/19  1:08 PM  Result Value Ref Range   WBC 8.5 4.0 - 10.5 K/uL   RBC  2.57 (L) 3.87 - 5.11 MIL/uL   Hemoglobin 8.0 (L) 12.0 - 15.0 g/dL   HCT 26.1 (L) 36.0 - 46.0 %   MCV 101.6 (H) 80.0 - 100.0 fL   MCH 31.1 26.0 - 34.0 pg   MCHC 30.7 30.0 - 36.0 g/dL   RDW 14.3 11.5 - 15.5 %   Platelets 380 150 - 400 K/uL   nRBC 0.0 0.0 - 0.2 %   Neutrophils Relative % 76 %   Neutro Abs 6.6 1.7 - 7.7 K/uL   Lymphocytes Relative 12 %   Lymphs Abs 1.0 0.7 - 4.0 K/uL   Monocytes Relative 6 %   Monocytes Absolute 0.6 0.1 - 1.0 K/uL   Eosinophils Relative 4 %   Eosinophils Absolute 0.3 0.0 - 0.5 K/uL   Basophils Relative 1 %   Basophils Absolute 0.0 0.0 - 0.1 K/uL   Immature Granulocytes 1 %   Abs Immature Granulocytes 0.04 0.00 - 0.07 K/uL    Comment: Performed at [facility] [address], [place], [place] [address]  Blood culture (routine x 2)     Status: None (Preliminary result)   Collection Time: 02/05/19  6:17 PM  Result Value Ref Range   Specimen Description SITE NOT SPECIFIED    Special Requests      BOTTLES DRAWN AEROBIC ONLY Blood Culture results may not be optimal due to an inadequate volume of blood received in culture bottles   Culture      NO GROWTH < 12 HOURS Performed at [facility] [address], [place], [place] [address]    Report Status PENDING   Blood culture (routine x 2)     Status: None (Preliminary result)   Collection Time: 02/05/19  8:04 PM  Result Value Ref Range   Specimen Description BLOOD RIGHT HAND    Special Requests      BOTTLES DRAWN AEROBIC ONLY Blood Culture adequate volume   Culture      NO GROWTH < 12 HOURS Performed at [facility] [address], [place], [place] [address]    Report Status PENDING   Basic metabolic panel     Status: Abnormal   Collection Time: 02/06/19  3:28 AM  Result Value Ref Range   Sodium 138 135 - 145  mmol/L   Potassium 3.5 3.5 - 5.1 mmol/L   Chloride 96 (L) 98 - 111 mmol/L   CO2 33 (H) 22 - 32 mmol/L   Glucose, Bld 84 70 - 99 mg/dL   BUN 23 (H) 6 - 20 mg/dL   Creatinine, Ser 9.30 (H) 0.44 - 1.00 mg/dL   Calcium 9.5 8.9 - 10.3 mg/dL   GFR calc non Af Amer 5 (L) >60 mL/min   GFR calc Af Amer 6 (L) >60 mL/min   Anion gap 9 5 - 15    Comment: Performed at [facility], [place] [address], [place], [place] [address]  CBC     Status: Abnormal   Collection Time: 02/06/19  3:28 AM  Result Value Ref Range   WBC 7.3 4.0 - 10.5 K/uL   RBC 2.80 (L) 3.87 - 5.11 MIL/uL   Hemoglobin 8.4 (L) 12.0 - 15.0 g/dL   HCT 27.8 (L) 36.0 - 46.0 %   MCV 99.3 80.0 - 100.0 fL   MCH 30.0 26.0 - 34.0 pg   MCHC 30.2 30.0 - 36.0 g/dL   RDW 14.2 11.5 - 15.5 %   Platelets 391 150 -  400 K/uL   nRBC 0.0 0.0 - 0.2 %    Comment: Performed at [facility], [place] [address] [place], [place] [address]  Magnesium     Status: None   Collection Time: 02/06/19  3:28 AM  Result Value Ref Range   Magnesium 2.2 1.7 - 2.4 mg/dL    Comment: Performed at [facility] [address], [place], [place] [address]    ROS: see hpi   Physical Exam: Vitals:   02/05/19 1915 02/05/19 2000  BP: 123/69 135/85  Pulse: 72 80  Resp: (!) 9 18  Temp:  98.6 F (37 C)  SpO2: 100% 100%     General: alert AAF in bed NAD  Appropriated  HEENT: [place], eomi, not icteric , mmm Neck: No JVD  Heart: RRR , No m,r,g Lungs: CTA unlabored breathing  Abdomen: Obese, bs pos , soft , NT, ND Extremities: L Transmet Amp dressing dry /clean , Left inner groin dressing  Old druied blood, trace pedal edema L>R Skin: no overt  rash , warm dry  Neuro: OX3, moves all etrem   Dialysis Access:l LUA AVF  Pos bruit    Home meds:  - amlodipine 10 / carvedilol 25 bid/ clonidine 0.2 tid  - renvela 2-3 ac tid/ renal vite  - aspirin 81  - oxycodone IR prn/ tramadol 50 qid prn   NW MWF 4h 41min  88.5kg (leaving below 88.4 / 87.6 last 2 txs)   2/ 2.5 bath  Hep 7000  LUA AVF MIrcera 155mcg  q2wks ( last given 02/07) (hgb 8.4  02/05 )     Assessment/Plan 1. Left Lower extrem. Cellulitis /ho   L TMA s/p fem-pop bypass and  L TMA 01/18/19- Eval ./wu  per admit = on Zosyn And VVS to see  2. ESRD -  HD MWF  schedule   k 3.5 use 4 k bath  3. Hypertension/volume  - BP stable ,3  Op bp meds , no excess vol on exam  4. Anemia of ESRD   - HGB 8.4   esa due next week  5. Metabolic bone disease - Renvela as  binders , no vit d 6. PAD - sp LLE fem-pop BPG in Jan 2020  Ernest Haber, PA-C [facility] Beeper [phone] 02/06/2019, 11:49 AM   Pt seen, examined and agree w A/P as above.  [facility] 02/06/2019, 1:47 PM

## 2019-02-06 NOTE — NC FL2 (Signed)
[place] LEVEL OF CARE SCREENING TOOL     IDENTIFICATION  Patient Name: Rebekah Burton Birthdate: [birth year] Sex: female Admission Date (Current Location): 02/05/2019  [place] and [place] Number:  Rebekah Burton [id number] [place] and Address:  [facility] [place], [place] [address], [place], [place] [address]      Provider Number: [id number]  Attending Physician Name and Address:  Albertine Patricia, MD  Relative Name and Phone Number:  Nolen Mu; sister; [phone]    Current Level of Care: Hospital Recommended Level of Care: [place] Prior Approval Number:    Date Approved/Denied:   PASRR Number:  [id number]   Discharge Plan: SNF    Current Diagnoses: Patient Active Problem List   Diagnosis Date Noted  . Cellulitis of left lower extremity 02/05/2019  . Post-operative pain   . Acute blood loss anemia   . S/P transmetatarsal amputation of foot, left ([place])   . Ischemic ulcer of left foot ([place]) 01/16/2019  . ESRD (end stage renal disease) ([place]) 01/16/2019  . HTN (hypertension) 01/16/2019  . 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  . PAD (peripheral artery disease) ([place]) 01/16/2019    Orientation RESPIRATION BLADDER Height & Weight     Self, Time, Situation, Place  Normal Continent, External catheter Weight: 194 lb 14.2 oz (88.4 kg) Height:  5\' 4"  (162.6 cm)  BEHAVIORAL SYMPTOMS/MOOD NEUROLOGICAL BOWEL NUTRITION STATUS      Continent Diet(see discharge summary)  AMBULATORY STATUS COMMUNICATION OF NEEDS Skin   Extensive Assist Verbally Surgical wounds(closed incision on left leg with compression wrap dressing; closed incision on groin)                       Personal Care Assistance Level of Assistance  Bathing, Feeding, Dressing Bathing Assistance: Maximum assistance Feeding assistance: Independent Dressing Assistance: Maximum assistance     Functional Limitations Info  Sight, Hearing, Speech Sight Info: Impaired (pt  is legally blind) Hearing Info: Adequate Speech Info: Impaired    SPECIAL CARE FACTORS FREQUENCY  PT (By licensed PT), OT (By licensed OT)     PT Frequency: 5x week OT Frequency: 5x week            Contractures Contractures Info: Not present    Additional Factors Info  Code Status, Allergies, Isolation Precautions, Psychotropic Code Status Info: Full Code Allergies Info: PERCOCET OXYCODONE-ACETAMINOPHEN  Psychotropic Info: cloNIDine (CATAPRES) tablet 0.2 mg 3x daily PO   Isolation Precautions Info: Contact Precautions MRSA     Current Medications (02/06/2019):  This is the current hospital active medication list Current Facility-Administered Medications  Medication Dose Route Frequency Provider Last Rate Last Dose  . heparin 1000 UNIT/ML injection           . carvedilol (COREG) tablet 25 mg  25 mg Oral BID WC Howerter, Justin B, DO   25 mg at 02/06/19 0857  . cloNIDine (CATAPRES) tablet 0.2 mg  0.2 mg Oral TID Howerter, Justin B, DO   0.2 mg at 02/06/19 1004  . docusate sodium (COLACE) capsule 100 mg  100 mg Oral BID Howerter, Justin B, DO   100 mg at 02/06/19 1004  . HYDROcodone-acetaminophen (NORCO/VICODIN) 5-325 MG per tablet 1-2 tablet  1-2 tablet Oral Q4H PRN Schorr, Rhetta Mura, NP   1 tablet at 02/06/19 1005  . nicotine (NICODERM CQ - dosed in mg/24 hours) patch 14 mg  14 mg Transdermal Daily PRN Howerter, Justin B, DO      .  polyethylene glycol (MIRALAX / GLYCOLAX) packet 17 g  17 g Oral Daily Roney Jaffe, MD   17 g at 02/06/19 1005  . senna-docusate (Senokot-S) tablet 3 tablet  3 tablet Oral BID Elgergawy, Silver Huguenin, MD   3 tablet at 02/06/19 1143  . sevelamer carbonate (RENVELA) tablet 1,600 mg  1,600 mg Oral BID PRN Howerter, Justin B, DO      . sevelamer carbonate (RENVELA) tablet 3,200 mg  3,200 mg Oral TID WC Howerter, Justin B, DO   3,200 mg at 02/06/19 1143  . sorbitol 70 % solution 30 mL  30 mL Oral Q4H PRN Roney Jaffe, MD      . traMADol Veatrice [place]bon)  tablet 50 mg  50 mg Oral Q6H PRN Howerter, Justin B, DO   50 mg at 02/05/19 2145     Discharge Medications: Please see discharge summary for a list of discharge medications.  Relevant Imaging Results:  Relevant Lab Results:   Additional Information SS# [id number]; Monday/Wednesday/Friday, [facility],  [place], [place]

## 2019-02-06 NOTE — Plan of Care (Signed)
  Problem: Education: Goal: Knowledge of General Education information will improve Description Including pain rating scale, medication(s)/side effects and non-pharmacologic comfort measures Outcome: Progressing   Problem: Health Behavior/Discharge Planning: Goal: Ability to manage health-related needs will improve Outcome: Progressing   Problem: Clinical Measurements: Goal: Ability to maintain clinical measurements within normal limits will improve Outcome: Progressing Goal: Will remain free from infection Outcome: Progressing Goal: Diagnostic test results will improve Outcome: Progressing Goal: Respiratory complications will improve Outcome: Progressing Goal: Cardiovascular complication will be avoided Outcome: Progressing   Problem: Activity: Goal: Risk for activity intolerance will decrease Outcome: Progressing   Problem: Nutrition: Goal: Adequate nutrition will be maintained Outcome: Progressing   Problem: Elimination: Goal: Will not experience complications related to bowel motility Outcome: Progressing Goal: Will not experience complications related to urinary retention Outcome: Progressing   Problem: Safety: Goal: Ability to remain free from injury will improve Outcome: Progressing   Problem: Skin Integrity: Goal: Risk for impaired skin integrity will decrease Outcome: Progressing   Problem: Clinical Measurements: Goal: Ability to avoid or minimize complications of infection will improve Outcome: Progressing   Problem: Skin Integrity: Goal: Skin integrity will improve Outcome: Progressing

## 2019-02-06 NOTE — Social Work (Signed)
CSW acknowledging consult for SNF placement. Pt from [facility]. Will follow for therapy recommendations.   Westley Hummer, MSW, [place] Work [phone]

## 2019-02-06 NOTE — Progress Notes (Signed)
Subjective: Interval History: none..   Objective: Vital signs in last 24 hours: Temp:  [98.4 F (36.9 C)-98.6 F (37 C)] (P) 98.4 F (36.9 C) (02/12 1315) Pulse Rate:  [36-80] (P) 68 (02/12 1315) Resp:  [8-19] (P) 18 (02/12 1315) BP: (101-135)/(58-88) (P) 102/61 (02/12 1315) SpO2:  [93 %-100 %] (P) 98 % (02/12 1315) Weight:  [88.4 kg] 88.4 kg (02/12 1055)  Intake/Output from previous day: No intake/output data recorded. Intake/Output this shift: Total I/O In: 550 [P.O.:550] Out: -   Surgical incisions are healing.  She does have separation of the metatarsal head with swelling in her foot.  She does have some debris in the base of this but also has granulation tissue.  Lab Results: Recent Labs    02/05/19 1308 02/06/19 0328  WBC 8.5 7.3  HGB 8.0* 8.4*  HCT 26.1* 27.8*  PLT 380 391   BMET Recent Labs    02/05/19 1308 02/06/19 0328  NA 138 138  K 3.9 3.5  CL 91* 96*  CO2 32 33*  GLUCOSE 118* 84  BUN 17 23*  CREATININE 8.07* 9.30*  CALCIUM 9.5 9.5    Studies/Results: Dg Foot Complete Left  Result Date: 02/05/2019 CLINICAL DATA:  Recent foot amputation, wound check, pain radiating from foot up to the thigh, end-stage renal disease on dialysis EXAM: LEFT FOOT - COMPLETE 3+ VIEW COMPARISON:  01/16/2019 FINDINGS: Interval transmetatarsal amputation LEFT foot. Bones demineralized. Soft tissue swelling at forefoot with skin clips noted. Slightly greater lucency and less well-defined margins are identified at the stump of the first metatarsal associated cortical definition suspicious for osteomyelitis. No additional areas of bone destruction are identified. Plantar and Achilles insertion calcaneal spur formation. 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 Poorly defined margins and cortical loss at the first metatarsal stump suspicious for osteomyelitis. Electronically Signed   By: Lavonia Dana M.D.   On: 02/05/2019  16:54   Dg Foot Complete Left  Result Date: 01/16/2019 CLINICAL DATA:  Left foot wound with black and 3rd through 5th toes. Wound present for 2 months. End-stage renal disease. EXAM: LEFT FOOT - COMPLETE 3+ VIEW COMPARISON:  Radiographs 01/15/2019. FINDINGS: Periarticular osteopenia again noted at the 2nd through 5th metatarsophalangeal joints without cortical destruction. There is no evidence of acute fracture or dislocation. There is stable spurring along the lateral base of the 1st proximal phalanx. There are no erosive changes. Forefoot soft tissue swelling appears stable without radiopaque foreign body or soft tissue emphysema. Calcaneal spurring and diffuse vascular calcifications are noted. IMPRESSION: Stable radiographs compared with prior study from yesterday. No radiographic evidence of osteomyelitis. Electronically Signed   By: Richardean Sale M.D.   On: 01/16/2019 19:35   Dg Foot Complete Left  Result Date: 01/15/2019 CLINICAL DATA:  Nonhealing wound on dorsal side of left foot. EXAM: LEFT FOOT - COMPLETE 3+ VIEW COMPARISON:  None. FINDINGS: Mild diffuse soft tissue swelling. There is periarticular osteopenia at the second through fifth MTP joints. No acute fractures, dislocations are areas of bone erosion identified. Posterior and plantar calcaneal heel spurs. IMPRESSION: 1. Soft tissue swelling. 2. No acute bone abnormality. Electronically Signed   By: Kerby Moors M.D.   On: 01/15/2019 20:07   Vas [place] Abi With/wo Tbi  Result Date: 01/22/2019 LOWER EXTREMITY DOPPLER STUDY Indications: Peripheral artery disease.  Limitations: patient positioning and pain tolerance Performing Technologist: Abram Sander RVS  Examination Guidelines: A complete evaluation includes at minimum, Doppler waveform signals and  systolic blood pressure reading at the level of bilateral brachial, anterior tibial, and posterior tibial arteries, when vessel segments are accessible. Bilateral testing is considered an  integral part of a complete examination. Photoelectric Plethysmograph (PPG) waveforms and toe systolic pressure readings are included as required and additional duplex testing as needed. Limited examinations for reoccurring indications may be performed as noted.  ABI Findings: +--------+------------------+-----+---------+--------+ Right   Rt Pressure (mmHg)IndexWaveform Comment  +--------+------------------+-----+---------+--------+ Brachial130                    triphasic         +--------+------------------+-----+---------+--------+ PTA     255               1.96 biphasic          +--------+------------------+-----+---------+--------+ DP      255               1.96 biphasic          +--------+------------------+-----+---------+--------+ +--------+------------------+-----+-------------------+------------------------+ Left    Lt Pressure (mmHg)IndexWaveform           Comment                  +--------+------------------+-----+-------------------+------------------------+ Brachial                                          unable to obtain due to                                                    fistula                  +--------+------------------+-----+-------------------+------------------------+ PTA     90                0.69 dampened monophasic                         +--------+------------------+-----+-------------------+------------------------+ DP                             dampened monophasicunable to obtain                                                           pressure due to pain                                                       tolerance                +--------+------------------+-----+-------------------+------------------------+ +-------+-----------+-----------+------------+------------+ ABI/TBIToday's ABIToday's TBIPrevious ABIPrevious TBI +-------+-----------+-----------+------------+------------+ Right  1.96                                            +-------+-----------+-----------+------------+------------+ Left   0.69                                           +-------+-----------+-----------+------------+------------+  Summary: Right: Resting right ankle-brachial index indicates noncompressible right lower extremity arteries. Left: Resting left ankle-brachial index indicates moderate left lower extremity arterial disease.  *See table(s) above for measurements and observations.  Electronically signed by Ruta Hinds MD on 01/22/2019 at 11:49:53 AM.   Final    Vas [place] Lower Extremity Arterial Duplex  Result Date: 01/16/2019 LOWER EXTREMITY ARTERIAL DUPLEX STUDY Indications: Peripheral artery disease, and Left non-healing foot ulcer.  Vascular Interventions: Right Fem-BK Pop BPG placed 2018 per patient at outside                         facility. Current ABI:            Unknown Performing Technologist: Caralee Ates BA, RVT, RDMS  Examination Guidelines: A complete evaluation includes B-mode imaging, spectral Doppler, color Doppler, and power Doppler as needed of all accessible portions of each vessel. Bilateral testing is considered an integral part of a complete examination. Limited examinations for reoccurring indications may be performed as noted.  Right Duplex Findings: +-----------+--------+-----+--------+----------+--------------+            PSV cm/sRatioStenosisWaveform  Comments       +-----------+--------+-----+--------+----------+--------------+ ATA Distal 56                   monophasic               +-----------+--------+-----+--------+----------+--------------+ PTA Mid    80                   biphasic                 +-----------+--------+-----+--------+----------+--------------+ PTA Distal 85                   monophasic               +-----------+--------+-----+--------+----------+--------------+ PERO Distal                               Not visualized  +-----------+--------+-----+--------+----------+--------------+  Right Graft #1: Femoral artery - Below knee popliteal artery +------------------+--------+--------+---------+------------+                   PSV cm/sStenosisWaveform Comments     +------------------+--------+--------+---------+------------+ Inflow            65              triphasic             +------------------+--------+--------+---------+------------+ Prox Anastomosis  121             triphasic             +------------------+--------+--------+---------+------------+ Proximal Graft    136             triphasic             +------------------+--------+--------+---------+------------+ Mid Graft         82              triphasic             +------------------+--------+--------+---------+------------+ Distal Graft      111             triphasicValves noted +------------------+--------+--------+---------+------------+ Distal Anastomosis78              triphasic             +------------------+--------+--------+---------+------------+ Outflow           74  triphasic             +------------------+--------+--------+---------+------------+  Left Duplex Findings: +-----------+--------+-----+---------------+-------------------+--------+            PSV cm/sRatioStenosis       Waveform           Comments +-----------+--------+-----+---------------+-------------------+--------+ CFA Distal 221          50-74% stenosismonophasic                  +-----------+--------+-----+---------------+-------------------+--------+ DFA        259          50-74% stenosismonophasic                  +-----------+--------+-----+---------------+-------------------+--------+ SFA Prox   21                                                      +-----------+--------+-----+---------------+-------------------+--------+ SFA Mid                 occluded                                    +-----------+--------+-----+---------------+-------------------+--------+ SFA Distal 11                          monophasic                  +-----------+--------+-----+---------------+-------------------+--------+ POP Prox   19                          monophasic                  +-----------+--------+-----+---------------+-------------------+--------+ POP Mid    18                          monophasic                  +-----------+--------+-----+---------------+-------------------+--------+ POP Distal 15                          monophasic                  +-----------+--------+-----+---------------+-------------------+--------+ ATA Distal 15                          monophasic                  +-----------+--------+-----+---------------+-------------------+--------+ PTA Prox   16                          monophasic                  +-----------+--------+-----+---------------+-------------------+--------+ PTA Mid    16                          monophasic                  +-----------+--------+-----+---------------+-------------------+--------+ PTA Distal 21  monophasic                  +-----------+--------+-----+---------------+-------------------+--------+ PERO Prox  12                          dampened monophasic         +-----------+--------+-----+---------------+-------------------+--------+ PERO Mid   9                           monophasic                  +-----------+--------+-----+---------------+-------------------+--------+ PERO Distal12                          monophasic                  +-----------+--------+-----+---------------+-------------------+--------+  Findings reported to Vivien Rota at Dr. Dellia Nims at 4:11 pm.  Summary: Right Graft(s): Patent right femoral - below knee popliteal BPG without evidence of stenosis. Left: 50-74% stenosis noted in the common femoral artery. 50-74% stenosis noted in the  deep femoral artery. Total occlusion noted in the superficial femoral artery.  See table(s) above for measurements and observations. Electronically signed by Curt Jews MD on 01/16/2019 at 8:13:50 AM.    Final    Anti-infectives: Anti-infectives (From admission, onward)   Start     Dose/Rate Route Frequency Ordered Stop   02/08/19 1200  vancomycin (VANCOCIN) IVPB 1000 mg/200 mL premix     1,000 mg 200 mL/hr over 60 Minutes Intravenous Every M-W-F (Hemodialysis) 02/06/19 1346     02/06/19 1800  vancomycin (VANCOCIN) 2,000 mg in sodium chloride 0.9 % 500 mL IVPB     2,000 mg 250 mL/hr over 120 Minutes Intravenous  Once 02/06/19 1346     02/06/19 1800  ceFEPIme (MAXIPIME) 2 g in sodium chloride 0.9 % 100 mL IVPB     2 g 200 mL/hr over 30 Minutes Intravenous Every M-W-F (1800) 02/06/19 1346     02/05/19 1830  piperacillin-tazobactam (ZOSYN) IVPB 3.375 g     3.375 g 100 mL/hr over 30 Minutes Intravenous  Once 02/05/19 1820 02/05/19 1940      Assessment/Plan: s/p * No surgery found * 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  She had extensive gangrenous changes over the dorsum of her foot.  This was resected as far as could be achieved with salvageable functional partial foot amputation.  She has separated her incision.  There is no evidence of invasive infection.  Would recommend continued local care and IV antibiotics.  Discussed this at length with the patient.  Explained that she certainly is at risk for nonhealing and below-knee amputation.  Also feel that there is a reasonable chance that this could heal with local wound care.  We will follow with you   LOS: 1 day   Kairee Kozma 02/06/2019, 2:18 PM

## 2019-02-07 DIAGNOSIS — I739 Peripheral vascular disease, unspecified: Secondary | ICD-10-CM

## 2019-02-07 MED ORDER — SODIUM CHLORIDE 0.9 % IV SOLN
20.0000 ug | Freq: Once | INTRAVENOUS | Status: AC
  Administered 2019-02-07: 20 ug via INTRAVENOUS
  Filled 2019-02-07: qty 5

## 2019-02-07 MED ORDER — HEPARIN SODIUM (PORCINE) 5000 UNIT/ML IJ SOLN
5000.0000 [IU] | Freq: Three times a day (TID) | INTRAMUSCULAR | Status: DC
  Filled 2019-02-07: qty 1

## 2019-02-07 NOTE — Progress Notes (Addendum)
Subjective:  In Room no cos , tolerated HD yest on schedule   Objective Vital signs in last 24 hours: Vitals:   02/06/19 1736 02/06/19 2040 02/06/19 2223 02/07/19 0552  BP: 123/64 111/66 111/66 110/60  Pulse: 76 82  74  Resp: 18 18  18   Temp: 98 F (36.7 C) 99.3 F (37.4 C)  98.6 F (37 C)  TempSrc: Oral Oral  Oral  SpO2: 98% 100%  100%  Weight: 86.2 kg   86.1 kg  Height:       Weight change: 0 kg   Physical Exam: General: alert AAF in bed NAD Calm   Heart: RRR , No m,r,g Lungs: CTA unlabored breathing  Abdomen: Obese, bs pos , soft , NT, ND Extremities: L Transmet Amp dressing dry /clean , Left inner groin dressing clean , trace pedal edema L>R   Dialysis Access:l LUA AVF  Pos bruit   Home meds:  - amlodipine 10 / carvedilol 25 bid/ clonidine 0.2 tid  - renvela 2-3 ac tid/ renal vite  - aspirin 81  - oxycodone IR prn/ tramadol 50 qid prn  NW  Center MWF 4h 51min  88.5kg (leaving below 88.4 / 87.6 last 2 txs)  2/ 2.5 bath  Hep 7000  LUA AVF MIrcera 137mcg  q2wks ( last given 02/07) (hgb 8.4  02/05 )    Problem/Plan: 1. Left Lower extrem. 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 ESRD -  HD MWF  schedule   k 3.5 use 4 k bath with hd tomr  Fu labs pre hd  3. Hypertension/volume  - BP stable ,on 3 Op bp meds , now only on Carvedilol 25 mg bid  And Clonidine 0.2 mg TID ( am bp 111/66)  decr clonidine to bid ,no excess vol on exam  4. Anemia of ESRD   - HGB 8.4   esa due next week  5. Metabolic bone disease - Renvela as  binders , no vit d 6. PAD - sp LLE fem-pop BPG in Jan 2020   Ernest Haber, PA-C [facility] [phone] 02/07/2019,12:41 PM  LOS: 2 days   Pt seen, examined and agree w A/P as above.  [facility] 02/07/2019, 1:01 PM    Labs: Basic Metabolic Panel: Recent Labs  Lab 02/05/19 1308 02/06/19 0328  NA 138 138  K 3.9 3.5  CL  91* 96*  CO2 32 33*  GLUCOSE 118* 84  BUN 17 23*  CREATININE 8.07* 9.30*  CALCIUM 9.5 9.5   Liver Function Tests: Recent Labs  Lab 02/05/19 1308  AST 13*  ALT 7  ALKPHOS 41  BILITOT 0.6  PROT 6.3*  ALBUMIN 2.6*   No results for input(s): LIPASE, AMYLASE in the last 168 hours. No results for input(s): AMMONIA in the last 168 hours. CBC: Recent Labs  Lab 02/05/19 1308 02/06/19 0328  WBC 8.5 7.3  NEUTROABS 6.6  --   HGB 8.0* 8.4*  HCT 26.1* 27.8*  MCV 101.6* 99.3  PLT 380 391   Medications: . ceFEPime (MAXIPIME) IV Stopped (02/06/19 1918)  . [START ON 02/08/2019] vancomycin     . carvedilol  25 mg Oral BID WC  . cloNIDine  0.2 mg Oral TID  . docusate sodium  100 mg Oral BID  . heparin injection (subcutaneous)  5,000 Units Subcutaneous Q8H  .  nicotine  21 mg Transdermal Daily  . polyethylene glycol  17 g Oral Daily  . senna-docusate  3 tablet Oral BID  . sevelamer carbonate  3,200 mg Oral TID WC

## 2019-02-07 NOTE — Progress Notes (Signed)
PROGRESS NOTE                                                                                                                                                                                                             Patient Demographics:    Rebekah Burton, is a 43 y.o. female, DOB - Mar 22, 1976, OIN:[id number]  Admit date - 02/05/2019   Admitting Physician Rhetta Mura, DO  Outpatient Primary MD for the patient is Patient, [name]  LOS - 2   Chief Complaint  Patient presents with  . Wound Check       Brief Narrative    43 y.o. female  with a ESRD, on dialysis MWF, CAD, hypertension, blind, with recent hospitalization secondary to gangrene of foot, status post revascularization of left lower extremity, with transmetatarsal amputation, , patient was admitted for wound dehiscence and surrounding cellulitis .   Subjective:    Rebekah Burton today has any fever or chills, reports pain in left foot.   Assessment  & Plan :    Principal Problem:   Cellulitis of left lower extremity Active Problems:   ESRD (end stage renal disease) (HCC)   HTN (hypertension)   PAD (peripheral artery disease) (HCC)     Left wound dehiscence with surrounding cellulitis  -Patient with recent transmetatarsal amputation, vascular surgery input greatly appreciated, at this point continue with wound care, daily dressing twice daily, empiric antibiotic coverage. -Continue with IV cefepime and vancomycin, given recent operative culture showing Pseudomonas/E. coli sensitive to cefepime, and she is MRSA PCR positive. -Follow-up blood cultures, so far no growth to date .  End-stage renal disease -Consulted, dialysis per renal, due for dialysis tomorrow  Anemia of ESRD -Hemoglobin  at baseline, transfuse as needed for hemoglobin less than 8, she is due for ESA next week  PAD -status post left lower extremity femoropopliteal bypass in January 2020, vascular  surgery on board  Tobacco abuse -We will start a nicotine patch  Hypertension -Pressure acceptable, continue with home meds Coreg, clonidine, continue to hold amlodipine  Code Status : Full  Family Communication  : None at bedside  Disposition Plan  : Pending further work up  Barriers For Discharge : Patient remains on IV antibiotics   Consults  :  Vascular, renal  Procedures  : None  DVT Prophylaxis  :  [place] heparin  Lab Results  Component Value Date   PLT 391 02/06/2019    Antibiotics  :    Anti-infectives (From admission, onward)   Start     Dose/Rate Route Frequency Ordered Stop   02/08/19 1200  vancomycin (VANCOCIN) IVPB 1000 mg/200 mL premix     1,000 mg 200 mL/hr over 60 Minutes Intravenous Every M-W-F (Hemodialysis) 02/06/19 1346     02/06/19 1800  vancomycin (VANCOCIN) 2,000 mg in sodium chloride 0.9 % 500 mL IVPB     2,000 mg 250 mL/hr over 120 Minutes Intravenous  Once 02/06/19 1346 02/06/19 2150   02/06/19 1800  ceFEPIme (MAXIPIME) 2 g in sodium chloride 0.9 % 100 mL IVPB     2 g 200 mL/hr over 30 Minutes Intravenous Every M-W-F (1800) 02/06/19 1346     02/05/19 1830  piperacillin-tazobactam (ZOSYN) IVPB 3.375 g     3.375 g 100 mL/hr over 30 Minutes Intravenous  Once 02/05/19 1820 02/05/19 1940        Objective:   Vitals:   02/06/19 1736 02/06/19 2040 02/06/19 2223 02/07/19 0552  BP: 123/64 111/66 111/66 110/60  Pulse: 76 82  74  Resp: 18 18  18   Temp: 98 F (36.7 C) 99.3 F (37.4 C)  98.6 F (37 C)  TempSrc: Oral Oral  Oral  SpO2: 98% 100%  100%  Weight: 86.2 kg   86.1 kg  Height:        Wt Readings from Last 3 Encounters:  02/07/19 86.1 kg  01/25/19 88.1 kg  07/17/18 93.9 kg     Intake/Output Summary (Last 24 hours) at 02/07/2019 1342 Last data filed at 02/07/2019 0902 Gross per 24 hour  Intake 220 ml  Output 2000 ml  Net -1780 ml     Physical Exam  Awake Alert, Oriented X 3, No new F.N deficits, Normal affect Symmetrical  Chest wall movement, Good air movement bilaterally, CTAB RRR,No Gallops,Rubs or new Murmurs, No Parasternal Heave +ve B.Sounds, Abd Soft, No tenderness, No rebound - guarding or rigidity. 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        Data Review:    CBC Recent Labs  Lab 02/05/19 1308 02/06/19 0328  WBC 8.5 7.3  HGB 8.0* 8.4*  HCT 26.1* 27.8*  PLT 380 391  MCV 101.6* 99.3  MCH 31.1 30.0  MCHC 30.7 30.2  RDW 14.3 14.2  LYMPHSABS 1.0  --   MONOABS 0.6  --   EOSABS 0.3  --   BASOSABS 0.0  --     Chemistries  Recent Labs  Lab 02/05/19 1308 02/06/19 0328  NA 138 138  K 3.9 3.5  CL 91* 96*  CO2 32 33*  GLUCOSE 118* 84  BUN 17 23*  CREATININE 8.07* 9.30*  CALCIUM 9.5 9.5  MG  --  2.2  AST 13*  --   ALT 7  --   ALKPHOS 41  --   BILITOT 0.6  --    ------------------------------------------------------------------------------------------------------------------ No results for input(s): CHOL, HDL, LDLCALC, TRIG, CHOLHDL, LDLDIRECT in the last 72 hours.  Lab Results  Component Value Date   HGBA1C 5.2 01/16/2019   ------------------------------------------------------------------------------------------------------------------ No results for input(s): TSH, T4TOTAL, T3FREE, THYROIDAB in the last 72 hours.  Invalid input(s): FREET3 ------------------------------------------------------------------------------------------------------------------ No results for input(s): VITAMINB12, FOLATE, FERRITIN, TIBC, IRON, RETICCTPCT in the last 72 hours.  Coagulation profile No results for input(s):  INR, PROTIME in the last 168 hours.  No results for input(s): DDIMER in the last 72 hours.  Cardiac Enzymes No results for input(s): CKMB, TROPONINI, MYOGLOBIN in the last 168 hours.  Invalid input(s):  CK ------------------------------------------------------------------------------------------------------------------ No results found for: BNP  Inpatient Medications  Scheduled Meds: . carvedilol  25 mg Oral BID WC  . cloNIDine  0.2 mg Oral TID  . docusate sodium  100 mg Oral BID  . heparin injection (subcutaneous)  5,000 Units Subcutaneous Q8H  . nicotine  21 mg Transdermal Daily  . polyethylene glycol  17 g Oral Daily  . senna-docusate  3 tablet Oral BID  . sevelamer carbonate  3,200 mg Oral TID WC   Continuous Infusions: . ceFEPime (MAXIPIME) IV Stopped (02/06/19 1918)  . [START ON 02/08/2019] vancomycin     PRN Meds:.HYDROcodone-acetaminophen, nicotine, sevelamer carbonate, sorbitol, traMADol  Micro Results Recent Results (from the past 240 hour(s))  Blood culture (routine x 2)     Status: None (Preliminary result)   Collection Time: 02/05/19  6:17 PM  Result Value Ref Range Status   Specimen Description SITE NOT SPECIFIED  Final   Special Requests   Final    BOTTLES DRAWN AEROBIC ONLY Blood Culture results may not be optimal due to an inadequate volume of blood received in culture bottles   Culture   Final    NO GROWTH 2 DAYS Performed at [facility] [address] [place], [place] [address]    Report Status PENDING  Incomplete  Blood culture (routine x 2)     Status: None (Preliminary result)   Collection Time: 02/05/19  8:04 PM  Result Value Ref Range Status   Specimen Description BLOOD RIGHT HAND  Final   Special Requests   Final    BOTTLES DRAWN AEROBIC ONLY Blood Culture adequate volume   Culture   Final    NO GROWTH 2 DAYS Performed at [facility], [place] [address], [place], [place] [address]    Report Status PENDING  Incomplete    Radiology Reports Dg Foot Complete Left  Result Date: 02/05/2019 CLINICAL DATA:  Recent foot amputation, wound check, pain radiating from foot up to the thigh, end-stage renal disease on dialysis EXAM: LEFT FOOT -  COMPLETE 3+ VIEW COMPARISON:  01/16/2019 FINDINGS: Interval transmetatarsal amputation LEFT foot. Bones demineralized. Soft tissue swelling at forefoot with skin clips noted. Slightly greater lucency and less well-defined margins are identified at the stump of the first metatarsal associated cortical definition suspicious for osteomyelitis. No additional areas of bone destruction are identified. Plantar and Achilles insertion calcaneal spur formation. 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 Poorly defined margins and cortical loss at the first metatarsal stump suspicious for osteomyelitis. Electronically Signed   By: Lavonia Dana M.D.   On: 02/05/2019 16:54   Dg Foot Complete Left  Result Date: 01/16/2019 CLINICAL DATA:  Left foot wound with black and 3rd through 5th toes. Wound present for 2 months. End-stage renal disease. EXAM: LEFT FOOT - COMPLETE 3+ VIEW COMPARISON:  Radiographs 01/15/2019. FINDINGS: Periarticular osteopenia again noted at the 2nd through 5th metatarsophalangeal joints without cortical destruction. There is no evidence of acute fracture or dislocation. There is stable spurring along the lateral base of the 1st proximal phalanx. There are no erosive changes. Forefoot soft tissue swelling appears stable without radiopaque foreign body or soft tissue emphysema. Calcaneal spurring and diffuse vascular calcifications are noted. IMPRESSION: Stable radiographs compared  with prior study from yesterday. No radiographic evidence of osteomyelitis. Electronically Signed   By: Richardean Sale M.D.   On: 01/16/2019 19:35   Dg Foot Complete Left  Result Date: 01/15/2019 CLINICAL DATA:  Nonhealing wound on dorsal side of left foot. EXAM: LEFT FOOT - COMPLETE 3+ VIEW COMPARISON:  None. FINDINGS: Mild diffuse soft tissue swelling. There is periarticular osteopenia at the second through fifth MTP joints. No acute fractures, dislocations are  areas of bone erosion identified. Posterior and plantar calcaneal heel spurs. IMPRESSION: 1. Soft tissue swelling. 2. No acute bone abnormality. Electronically Signed   By: Kerby Moors M.D.   On: 01/15/2019 20:07   Vas [place] Abi With/wo Tbi  Result Date: 01/22/2019 LOWER EXTREMITY DOPPLER STUDY Indications: Peripheral artery disease.  Limitations: patient positioning and pain tolerance Performing Technologist: Abram Sander RVS  Examination Guidelines: A complete evaluation includes at minimum, Doppler waveform signals and systolic blood pressure reading at the level of bilateral brachial, anterior tibial, and posterior tibial arteries, when vessel segments are accessible. Bilateral testing is considered an integral part of a complete examination. Photoelectric Plethysmograph (PPG) waveforms and toe systolic pressure readings are included as required and additional duplex testing as needed. Limited examinations for reoccurring indications may be performed as noted.  ABI Findings: +--------+------------------+-----+---------+--------+ Right   Rt Pressure (mmHg)IndexWaveform Comment  +--------+------------------+-----+---------+--------+ Brachial130                    triphasic         +--------+------------------+-----+---------+--------+ PTA     255               1.96 biphasic          +--------+------------------+-----+---------+--------+ DP      255               1.96 biphasic          +--------+------------------+-----+---------+--------+ +--------+------------------+-----+-------------------+------------------------+ Left    Lt Pressure (mmHg)IndexWaveform           Comment                  +--------+------------------+-----+-------------------+------------------------+ Brachial                                          unable to obtain due to                                                    fistula                   +--------+------------------+-----+-------------------+------------------------+ PTA     90                0.69 dampened monophasic                         +--------+------------------+-----+-------------------+------------------------+ DP                             dampened monophasicunable to obtain  pressure due to pain                                                       tolerance                +--------+------------------+-----+-------------------+------------------------+ +-------+-----------+-----------+------------+------------+ ABI/TBIToday's ABIToday's TBIPrevious ABIPrevious TBI +-------+-----------+-----------+------------+------------+ Right  1.96                                           +-------+-----------+-----------+------------+------------+ Left   0.69                                           +-------+-----------+-----------+------------+------------+  Summary: Right: Resting right ankle-brachial index indicates noncompressible right lower extremity arteries. Left: Resting left ankle-brachial index indicates moderate left lower extremity arterial disease.  *See table(s) above for measurements and observations.  Electronically signed by Ruta Hinds MD on 01/22/2019 at 11:49:53 AM.   Final    Vas [place] Lower Extremity Arterial Duplex  Result Date: 01/16/2019 LOWER EXTREMITY ARTERIAL DUPLEX STUDY Indications: Peripheral artery disease, and Left non-healing foot ulcer.  Vascular Interventions: Right Fem-BK Pop BPG placed 2018 per patient at outside                         facility. Current ABI:            Unknown Performing Technologist: Caralee Ates BA, RVT, RDMS  Examination Guidelines: A complete evaluation includes B-mode imaging, spectral Doppler, color Doppler, and power Doppler as needed of all accessible portions of each vessel. Bilateral testing is considered an integral part of a  complete examination. Limited examinations for reoccurring indications may be performed as noted.  Right Duplex Findings: +-----------+--------+-----+--------+----------+--------------+            PSV cm/sRatioStenosisWaveform  Comments       +-----------+--------+-----+--------+----------+--------------+ ATA Distal 56                   monophasic               +-----------+--------+-----+--------+----------+--------------+ PTA Mid    80                   biphasic                 +-----------+--------+-----+--------+----------+--------------+ PTA Distal 85                   monophasic               +-----------+--------+-----+--------+----------+--------------+ PERO Distal                               Not visualized +-----------+--------+-----+--------+----------+--------------+  Right Graft #1: Femoral artery - Below knee popliteal artery +------------------+--------+--------+---------+------------+                   PSV cm/sStenosisWaveform Comments     +------------------+--------+--------+---------+------------+ Inflow            65              triphasic             +------------------+--------+--------+---------+------------+  Prox Anastomosis  121             triphasic             +------------------+--------+--------+---------+------------+ Proximal Graft    136             triphasic             +------------------+--------+--------+---------+------------+ Mid Graft         82              triphasic             +------------------+--------+--------+---------+------------+ Distal Graft      111             triphasicValves noted +------------------+--------+--------+---------+------------+ Distal Anastomosis78              triphasic             +------------------+--------+--------+---------+------------+ Outflow           74              triphasic             +------------------+--------+--------+---------+------------+  Left  Duplex Findings: +-----------+--------+-----+---------------+-------------------+--------+            PSV cm/sRatioStenosis       Waveform           Comments +-----------+--------+-----+---------------+-------------------+--------+ CFA Distal 221          50-74% stenosismonophasic                  +-----------+--------+-----+---------------+-------------------+--------+ DFA        259          50-74% stenosismonophasic                  +-----------+--------+-----+---------------+-------------------+--------+ SFA Prox   21                                                      +-----------+--------+-----+---------------+-------------------+--------+ SFA Mid                 occluded                                   +-----------+--------+-----+---------------+-------------------+--------+ SFA Distal 11                          monophasic                  +-----------+--------+-----+---------------+-------------------+--------+ POP Prox   19                          monophasic                  +-----------+--------+-----+---------------+-------------------+--------+ POP Mid    18                          monophasic                  +-----------+--------+-----+---------------+-------------------+--------+ POP Distal 15                          monophasic                  +-----------+--------+-----+---------------+-------------------+--------+  ATA Distal 15                          monophasic                  +-----------+--------+-----+---------------+-------------------+--------+ PTA Prox   16                          monophasic                  +-----------+--------+-----+---------------+-------------------+--------+ PTA Mid    16                          monophasic                  +-----------+--------+-----+---------------+-------------------+--------+ PTA Distal 21                          monophasic                   +-----------+--------+-----+---------------+-------------------+--------+ PERO Prox  12                          dampened monophasic         +-----------+--------+-----+---------------+-------------------+--------+ PERO Mid   9                           monophasic                  +-----------+--------+-----+---------------+-------------------+--------+ PERO Distal12                          monophasic                  +-----------+--------+-----+---------------+-------------------+--------+  Findings reported to Vivien Rota at Dr. Dellia Nims at 4:11 pm.  Summary: Right Graft(s): Patent right femoral - below knee popliteal BPG without evidence of stenosis. Left: 50-74% stenosis noted in the common femoral artery. 50-74% stenosis noted in the deep femoral artery. Total occlusion noted in the superficial femoral artery.  See table(s) above for measurements and observations. Electronically signed by Curt Jews MD on 01/16/2019 at 8:13:50 AM.    Final    Phillips Climes M.D on 02/07/2019 at 1:42 PM  Between 7am to 7pm - Pager - [phone]  After 7pm go to [email or web address] - password [email or web address]  [facility] -  Office  [phone]

## 2019-02-07 NOTE — Progress Notes (Signed)
Patient had one episode of significant bleeding from her femoral site. Pressure held and dressing changed. MD paged to be notified.

## 2019-02-07 NOTE — Progress Notes (Signed)
MD notified of change in pt's BP following DDAVP being administered. He states to continue to monitor as patient is asymptomatic.

## 2019-02-08 LAB — RENAL FUNCTION PANEL
Albumin: 2.6 g/dL — ABNORMAL LOW (ref 3.5–5.0)
Anion gap: 13 (ref 5–15)
BUN: 21 mg/dL — ABNORMAL HIGH (ref 6–20)
CO2: 25 mmol/L (ref 22–32)
Calcium: 10 mg/dL (ref 8.9–10.3)
Chloride: 100 mmol/L (ref 98–111)
Creatinine, Ser: 8.55 mg/dL — ABNORMAL HIGH (ref 0.44–1.00)
GFR calc Af Amer: 6 mL/min — ABNORMAL LOW (ref >60)
GFR calc non Af Amer: 5 mL/min — ABNORMAL LOW (ref >60)
Glucose, Bld: 82 mg/dL (ref 70–99)
Phosphorus: 3.4 mg/dL (ref 2.5–4.6)
Potassium: 4.2 mmol/L (ref 3.5–5.1)
Sodium: 138 mmol/L (ref 135–145)

## 2019-02-08 LAB — CBC
HCT: 25.1 % — ABNORMAL LOW (ref 36.0–46.0)
Hemoglobin: 7.6 g/dL — ABNORMAL LOW (ref 12.0–15.0)
MCH: 30.8 pg (ref 26.0–34.0)
MCHC: 30.3 g/dL (ref 30.0–36.0)
MCV: 101.6 fL — ABNORMAL HIGH (ref 80.0–100.0)
Platelets: 318 10*3/uL (ref 150–400)
RBC: 2.47 MIL/uL — ABNORMAL LOW (ref 3.87–5.11)
RDW: 14.6 % (ref 11.5–15.5)
WBC: 6.1 10*3/uL (ref 4.0–10.5)
nRBC: 0 % (ref 0.0–0.2)

## 2019-02-08 LAB — PREPARE RBC (CROSSMATCH)

## 2019-02-08 MED ORDER — SODIUM CHLORIDE 0.9% IV SOLUTION
Freq: Once | INTRAVENOUS | Status: AC
  Administered 2019-02-08: 18:00:00 via INTRAVENOUS

## 2019-02-08 MED ORDER — VANCOMYCIN HCL IN DEXTROSE 1-5 GM/200ML-% IV SOLN
INTRAVENOUS | Status: AC
  Administered 2019-02-08: 1000 mg via INTRAVENOUS
  Filled 2019-02-08: qty 200

## 2019-02-08 NOTE — Clinical Social Work Note (Signed)
Clinical Social Work Assessment  Patient Details  Name: Rebekah Burton MRN: [id number] Date of Birth: 11/04/1976  Date of referral:  02/08/19               Reason for consult:  Discharge Planning                Permission sought to share information with:  Facility Sport and exercise psychologist, Family Supports Permission granted to share information::  Yes, Verbal Permission Granted  Name::        Agency::     Relationship::  neice and aunt  Contact Information:     Housing/Transportation Living arrangements for the past 2 months:  Single Family Home, [place] of Information:  Patient Patient Interpreter Needed:  None Criminal Activity/Legal Involvement Pertinent to Current Situation/Hospitalization:  No - Comment as needed Significant Relationships:  Other Family Members Lives with:  Relatives Do you feel safe going back to the place where you live?  Yes Need for family participation in patient care:  Yes (Comment)  Care giving concerns:  Pt with multiple PMH inclduing dialysis and amputation. She requires max assist with ADLs and IADLs.  Social Worker assessment / plan:  CSW met with pt at bedside. Pt blind, introduced self, role and reason for visit. Pt from home with niece and then recently discharged to [facility]. Plan to return at discharge. Pt had no additional questions. Aware d/c is likely tomorrow.  Employment status:  Disabled (Comment on whether or not currently receiving Disability) Insurance information:  Medicare PT Recommendations:  Not assessed at this time Information / Referral to community resources:  [place]  Patient/Family's Response to care:  *Pt amenable to speaking with CSW, okay with return to SNF.  Patient/Family's Understanding of and Emotional Response to Diagnosis, Current Treatment, and Prognosis:  Pt states understanding of her diagnosis, current treatment and prognosis. Pt emotionally flat but appropriate  throughout assessment. She expressed no concerns or questions at this time.  Emotional Assessment Appearance:  Appears stated age, Disheveled, Malodorous Attitude/Demeanor/Rapport:  Engaged, Gracious Affect (typically observed):  Accepting, Adaptable, Appropriate, Flat Orientation:  Oriented to Situation, Oriented to  Time, Oriented to Place, Oriented to Self Alcohol / Substance use:  Not Applicable Psych involvement (Current and /or in the community):  No (Comment)  Discharge Needs  Concerns to be addressed:  Care Coordination Readmission within the last 30 days:  Yes Current discharge risk:  Dependent with Mobility, Physical Impairment Barriers to Discharge:  Continued Medical Work up   [employer], [place] 02/08/2019, 4:53 PM

## 2019-02-08 NOTE — Clinical Social Work Placement (Signed)
   CLINICAL SOCIAL WORK PLACEMENT  NOTE [facility]  Date:  02/08/2019  Patient Details  Name: Rebekah Burton MRN: [id number] Date of Birth: [birth year]  Clinical Social Work is seeking post-discharge placement for this patient at the [place] level of care (*CSW will initial, date and re-position this form in  chart as items are completed):  Yes   Patient/family provided with [facility] Department's list of facilities offering this level of care within the geographic area requested by the patient (or if unable, by the patient's family).  Yes   Patient/family informed of their freedom to choose among providers that offer the needed level of care, that participate in Medicare, Medicaid or managed care program needed by the patient, have an available bed and are willing to accept the patient.  Yes   Patient/family informed of [facility]'s ownership interest in [facility] and [facility], as well as of the fact that they are under no obligation to receive care at these facilities.  PASRR submitted to EDS on       PASRR number received on       Existing PASRR number confirmed on 02/06/19     FL2 transmitted to all facilities in geographic area requested by pt/family on 02/06/19     FL2 transmitted to all facilities within larger geographic area on       Patient informed that his/her managed care company has contracts with or will negotiate with certain facilities, including the following:        Yes   Patient/family informed of bed offers received.  Patient chooses bed at [facility]     Physician recommends and patient chooses bed at      Patient to be transferred to [facility] on 02/09/19.  Patient to be transferred to facility by [facility]     Patient family notified on 02/08/19 of transfer.  Name of family member notified:  pt states she will inform family, she is A&O     PHYSICIAN Please prepare  prescriptions, Please prepare priority discharge summary, including medications     Additional Comment:    _______________________________________________ Alexander Mt, LCSWA 02/08/2019, 4:54 PM

## 2019-02-08 NOTE — Progress Notes (Signed)
PROGRESS NOTE                                                                                                                                                                                                             Rebekah Burton Demographics:    Rebekah Burton, is a 43 y.o. female, DOB - 05-17-1976, HOZ:[id number]  Admit date - 02/05/2019   Admitting Physician Rhetta Mura, DO  Outpatient Primary MD for the Rebekah Burton is Rebekah Burton, [name]  LOS - 3   Chief Complaint  Rebekah Burton presents with  . Wound Check       Brief Narrative    43 y.o. female  with a ESRD, on dialysis MWF, CAD, hypertension, blind, with recent hospitalization secondary to gangrene of foot, status post revascularization of left lower extremity, with transmetatarsal amputation, , Rebekah Burton was admitted for wound dehiscence and surrounding cellulitis .   Subjective:    Avon Molock today has any fever or chills, reports pain in left foot.   Assessment  & Plan :    Principal Problem:   Cellulitis of left lower extremity Active Problems:   ESRD (end stage renal disease) (HCC)   HTN (hypertension)   PAD (peripheral artery disease) (HCC)     Left wound dehiscence with surrounding cellulitis  -Rebekah Burton with recent transmetatarsal amputation, vascular surgery input greatly appreciated, at this point continue with wound care, daily dressing twice daily, empiric antibiotic coverage, discussed with vascular surgery, who assessed the wound today, it appears to be improving, staples to be removed today, they will follow there and 2 weeks as an outpatient. -Empirically on IV cefepime and vancomycin given recent wound culture showing Pseudomonas and E. coli, as well she was MRSA PCR positive, so far her blood cultures remain negative, improving, will need another 10 days as an outpatient IV antibiotics which we can be given during dialysis.  End-stage renal disease -Consulted, dialysis per  renal, did receive dialysis today, to continue her Monday Wednesday Friday schedule after discharge  Anemia of ESRD -Hemoglobin is 7.6 today, will discuss with renal when appropriate to transfuse .  PAD -status post left lower extremity femoropopliteal bypass in January 2020, vascular surgery on board  Tobacco abuse -on nicotine patch  Hypertension -Blood pressure is soft, so I have stopped her Coreg ,  clonidine and amlodipine  Code Status : Full  Family Communication  : None at bedside  Disposition Plan  : back to SNF  Consults  :  Vascular, renal  Procedures  : None  DVT Prophylaxis  :  [place] heparin  Lab Results  Component Value Date   PLT 318 02/08/2019    Antibiotics  :    Anti-infectives (From admission, onward)   Start     Dose/Rate Route Frequency Ordered Stop   02/08/19 1200  vancomycin (VANCOCIN) IVPB 1000 mg/200 mL premix     1,000 mg 200 mL/hr over 60 Minutes Intravenous Every M-W-F (Hemodialysis) 02/06/19 1346     02/06/19 1800  vancomycin (VANCOCIN) 2,000 mg in sodium chloride 0.9 % 500 mL IVPB     2,000 mg 250 mL/hr over 120 Minutes Intravenous  Once 02/06/19 1346 02/06/19 2150   02/06/19 1800  ceFEPIme (MAXIPIME) 2 g in sodium chloride 0.9 % 100 mL IVPB     2 g 200 mL/hr over 30 Minutes Intravenous Every M-W-F (1800) 02/06/19 1346     02/05/19 1830  piperacillin-tazobactam (ZOSYN) IVPB 3.375 g     3.375 g 100 mL/hr over 30 Minutes Intravenous  Once 02/05/19 1820 02/05/19 1940        Objective:   Vitals:   02/08/19 1030 02/08/19 1100 02/08/19 1130 02/08/19 1305  BP: 133/88 (!) 158/100 (!) 149/93 (!) 139/94  Pulse: 80 80 76 87  Resp:    16  Temp:    98.4 F (36.9 C)  TempSrc:    Oral  SpO2:    100%  Weight:      Height:        Wt Readings from Last 3 Encounters:  02/08/19 85.3 kg  01/25/19 88.1 kg  07/17/18 93.9 kg     Intake/Output Summary (Last 24 hours) at 02/08/2019 1425 Last data filed at 02/07/2019 1636 Gross per 24 hour    Intake 50.03 ml  Output -  Net 50.03 ml     Physical Exam  Awake Alert, Oriented X 3, No new F.N deficits, Normal affect, Rebekah Burton is legally blind Symmetrical Chest wall movement, Good air movement bilaterally, CTAB RRR,No Gallops,Rubs or new Murmurs, No Parasternal Heave +ve B.Sounds, Abd Soft, No tenderness, No rebound - guarding or rigidity. 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   pic on 02/07/2019       Data Review:    CBC Recent Labs  Lab 02/05/19 1308 02/06/19 0328 02/08/19 0800  WBC 8.5 7.3 6.1  HGB 8.0* 8.4* 7.6*  HCT 26.1* 27.8* 25.1*  PLT 380 391 318  MCV 101.6* 99.3 101.6*  MCH 31.1 30.0 30.8  MCHC 30.7 30.2 30.3  RDW 14.3 14.2 14.6  LYMPHSABS 1.0  --   --   MONOABS 0.6  --   --   EOSABS 0.3  --   --   BASOSABS 0.0  --   --     Chemistries  Recent Labs  Lab 02/05/19 1308 02/06/19 0328 02/08/19 0800  NA 138 138 138  K 3.9 3.5 4.2  CL 91* 96* 100  CO2 32 33* 25  GLUCOSE 118* 84 82  BUN 17 23* 21*  CREATININE 8.07* 9.30* 8.55*  CALCIUM 9.5 9.5 10.0  MG  --  2.2  --   AST 13*  --   --   ALT 7  --   --   ALKPHOS 41  --   --  BILITOT 0.6  --   --    ------------------------------------------------------------------------------------------------------------------ No results for input(s): CHOL, HDL, LDLCALC, TRIG, CHOLHDL, LDLDIRECT in the last 72 hours.  Lab Results  Component Value Date   HGBA1C 5.2 01/16/2019   ------------------------------------------------------------------------------------------------------------------ No results for input(s): TSH, T4TOTAL, T3FREE, THYROIDAB in the last 72 hours.  Invalid input(s): FREET3 ------------------------------------------------------------------------------------------------------------------ No results for input(s): VITAMINB12, FOLATE, FERRITIN, TIBC,  IRON, RETICCTPCT in the last 72 hours.  Coagulation profile No results for input(s): INR, PROTIME in the last 168 hours.  No results for input(s): DDIMER in the last 72 hours.  Cardiac Enzymes No results for input(s): CKMB, TROPONINI, MYOGLOBIN in the last 168 hours.  Invalid input(s): CK ------------------------------------------------------------------------------------------------------------------ No results found for: BNP  Inpatient Medications  Scheduled Meds: . carvedilol  25 mg Oral BID WC  . docusate sodium  100 mg Oral BID  . nicotine  21 mg Transdermal Daily  . polyethylene glycol  17 g Oral Daily  . senna-docusate  3 tablet Oral BID  . sevelamer carbonate  3,200 mg Oral TID WC   Continuous Infusions: . ceFEPime (MAXIPIME) IV Stopped (02/06/19 1918)  . vancomycin 1,000 mg (02/08/19 1059)   PRN Meds:.HYDROcodone-acetaminophen, nicotine, sevelamer carbonate, sorbitol, traMADol  Micro Results Recent Results (from the past 240 hour(s))  Blood culture (routine x 2)     Status: None (Preliminary result)   Collection Time: 02/05/19  6:17 PM  Result Value Ref Range Status   Specimen Description SITE NOT SPECIFIED  Final   Special Requests   Final    BOTTLES DRAWN AEROBIC ONLY Blood Culture results may not be optimal due to an inadequate volume of blood received in culture bottles   Culture   Final    NO GROWTH 3 DAYS Performed at [facility], [place] [address] [place], [place] [address]    Report Status PENDING  Incomplete  Blood culture (routine x 2)     Status: None (Preliminary result)   Collection Time: 02/05/19  8:04 PM  Result Value Ref Range Status   Specimen Description BLOOD RIGHT HAND  Final   Special Requests   Final    BOTTLES DRAWN AEROBIC ONLY Blood Culture adequate volume   Culture   Final    NO GROWTH 3 DAYS Performed at [facility], [place] [address] [place], [place] [address]    Report Status PENDING  Incomplete    Radiology  Reports Dg Foot Complete Left  Result Date: 02/05/2019 CLINICAL DATA:  Recent foot amputation, wound check, pain radiating from foot up to the thigh, end-stage renal disease on dialysis EXAM: LEFT FOOT - COMPLETE 3+ VIEW COMPARISON:  01/16/2019 FINDINGS: Interval transmetatarsal amputation LEFT foot. Bones demineralized. Soft tissue swelling at forefoot with skin clips noted. Slightly greater lucency and less well-defined margins are identified at the stump of the first metatarsal associated cortical definition suspicious for osteomyelitis. No additional areas of bone destruction are identified. Plantar and Achilles insertion calcaneal spur formation. 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 Poorly defined margins and cortical loss at the first metatarsal stump suspicious for osteomyelitis. Electronically Signed   By: Lavonia Dana M.D.   On: 02/05/2019 16:54   Dg Foot Complete Left  Result Date: 01/16/2019 CLINICAL DATA:  Left foot wound with black and 3rd through 5th toes. Wound present for 2 months. End-stage renal disease. EXAM: LEFT FOOT - COMPLETE 3+ VIEW COMPARISON:  Radiographs 01/15/2019. FINDINGS: Periarticular osteopenia again noted at  the 2nd through 5th metatarsophalangeal joints without cortical destruction. There is no evidence of acute fracture or dislocation. There is stable spurring along the lateral base of the 1st proximal phalanx. There are no erosive changes. Forefoot soft tissue swelling appears stable without radiopaque foreign body or soft tissue emphysema. Calcaneal spurring and diffuse vascular calcifications are noted. IMPRESSION: Stable radiographs compared with prior study from yesterday. No radiographic evidence of osteomyelitis. Electronically Signed   By: Richardean Sale M.D.   On: 01/16/2019 19:35   Dg Foot Complete Left  Result Date: 01/15/2019 CLINICAL DATA:  Nonhealing wound on dorsal side of left foot. EXAM:  LEFT FOOT - COMPLETE 3+ VIEW COMPARISON:  None. FINDINGS: Mild diffuse soft tissue swelling. There is periarticular osteopenia at the second through fifth MTP joints. No acute fractures, dislocations are areas of bone erosion identified. Posterior and plantar calcaneal heel spurs. IMPRESSION: 1. Soft tissue swelling. 2. No acute bone abnormality. Electronically Signed   By: Kerby Moors M.D.   On: 01/15/2019 20:07   Vas [place] Abi With/wo Tbi  Result Date: 01/22/2019 LOWER EXTREMITY DOPPLER STUDY Indications: Peripheral artery disease.  Limitations: Rebekah Burton positioning and pain tolerance Performing Technologist: Abram Sander RVS  Examination Guidelines: A complete evaluation includes at minimum, Doppler waveform signals and systolic blood pressure reading at the level of bilateral brachial, anterior tibial, and posterior tibial arteries, when vessel segments are accessible. Bilateral testing is considered an integral part of a complete examination. Photoelectric Plethysmograph (PPG) waveforms and toe systolic pressure readings are included as required and additional duplex testing as needed. Limited examinations for reoccurring indications may be performed as noted.  ABI Findings: +--------+------------------+-----+---------+--------+ Right   Rt Pressure (mmHg)IndexWaveform Comment  +--------+------------------+-----+---------+--------+ Brachial130                    triphasic         +--------+------------------+-----+---------+--------+ PTA     255               1.96 biphasic          +--------+------------------+-----+---------+--------+ DP      255               1.96 biphasic          +--------+------------------+-----+---------+--------+ +--------+------------------+-----+-------------------+------------------------+ Left    Lt Pressure (mmHg)IndexWaveform           Comment                  +--------+------------------+-----+-------------------+------------------------+  Brachial                                          unable to obtain due to                                                    fistula                  +--------+------------------+-----+-------------------+------------------------+ PTA     90                0.69 dampened monophasic                         +--------+------------------+-----+-------------------+------------------------+ DP  dampened monophasicunable to obtain                                                           pressure due to pain                                                       tolerance                +--------+------------------+-----+-------------------+------------------------+ +-------+-----------+-----------+------------+------------+ ABI/TBIToday's ABIToday's TBIPrevious ABIPrevious TBI +-------+-----------+-----------+------------+------------+ Right  1.96                                           +-------+-----------+-----------+------------+------------+ Left   0.69                                           +-------+-----------+-----------+------------+------------+  Summary: Right: Resting right ankle-brachial index indicates noncompressible right lower extremity arteries. Left: Resting left ankle-brachial index indicates moderate left lower extremity arterial disease.  *See table(s) above for measurements and observations.  Electronically signed by Ruta Hinds MD on 01/22/2019 at 11:49:53 AM.   Final    Vas [place] Lower Extremity Arterial Duplex  Result Date: 01/16/2019 LOWER EXTREMITY ARTERIAL DUPLEX STUDY Indications: Peripheral artery disease, and Left non-healing foot ulcer.  Vascular Interventions: Right Fem-BK Pop BPG placed 2018 per Rebekah Burton at outside                         facility. Current ABI:            Unknown Performing Technologist: Caralee Ates BA, RVT, RDMS  Examination Guidelines: A complete evaluation includes B-mode imaging,  spectral Doppler, color Doppler, and power Doppler as needed of all accessible portions of each vessel. Bilateral testing is considered an integral part of a complete examination. Limited examinations for reoccurring indications may be performed as noted.  Right Duplex Findings: +-----------+--------+-----+--------+----------+--------------+            PSV cm/sRatioStenosisWaveform  Comments       +-----------+--------+-----+--------+----------+--------------+ ATA Distal 56                   monophasic               +-----------+--------+-----+--------+----------+--------------+ PTA Mid    80                   biphasic                 +-----------+--------+-----+--------+----------+--------------+ PTA Distal 85                   monophasic               +-----------+--------+-----+--------+----------+--------------+ PERO Distal                               Not visualized +-----------+--------+-----+--------+----------+--------------+  Right Graft #1:  Femoral artery - Below knee popliteal artery +------------------+--------+--------+---------+------------+                   PSV cm/sStenosisWaveform Comments     +------------------+--------+--------+---------+------------+ Inflow            65              triphasic             +------------------+--------+--------+---------+------------+ Prox Anastomosis  121             triphasic             +------------------+--------+--------+---------+------------+ Proximal Graft    136             triphasic             +------------------+--------+--------+---------+------------+ Mid Graft         82              triphasic             +------------------+--------+--------+---------+------------+ Distal Graft      111             triphasicValves noted +------------------+--------+--------+---------+------------+ Distal Anastomosis78              triphasic              +------------------+--------+--------+---------+------------+ Outflow           74              triphasic             +------------------+--------+--------+---------+------------+  Left Duplex Findings: +-----------+--------+-----+---------------+-------------------+--------+            PSV cm/sRatioStenosis       Waveform           Comments +-----------+--------+-----+---------------+-------------------+--------+ CFA Distal 221          50-74% stenosismonophasic                  +-----------+--------+-----+---------------+-------------------+--------+ DFA        259          50-74% stenosismonophasic                  +-----------+--------+-----+---------------+-------------------+--------+ SFA Prox   21                                                      +-----------+--------+-----+---------------+-------------------+--------+ SFA Mid                 occluded                                   +-----------+--------+-----+---------------+-------------------+--------+ SFA Distal 11                          monophasic                  +-----------+--------+-----+---------------+-------------------+--------+ POP Prox   19                          monophasic                  +-----------+--------+-----+---------------+-------------------+--------+ POP Mid    18  monophasic                  +-----------+--------+-----+---------------+-------------------+--------+ POP Distal 15                          monophasic                  +-----------+--------+-----+---------------+-------------------+--------+ ATA Distal 15                          monophasic                  +-----------+--------+-----+---------------+-------------------+--------+ PTA Prox   16                          monophasic                  +-----------+--------+-----+---------------+-------------------+--------+ PTA Mid    16                           monophasic                  +-----------+--------+-----+---------------+-------------------+--------+ PTA Distal 21                          monophasic                  +-----------+--------+-----+---------------+-------------------+--------+ PERO Prox  12                          dampened monophasic         +-----------+--------+-----+---------------+-------------------+--------+ PERO Mid   9                           monophasic                  +-----------+--------+-----+---------------+-------------------+--------+ PERO Distal12                          monophasic                  +-----------+--------+-----+---------------+-------------------+--------+  Findings reported to Vivien Rota at Dr. Dellia Nims at 4:11 pm.  Summary: Right Graft(s): Patent right femoral - below knee popliteal BPG without evidence of stenosis. Left: 50-74% stenosis noted in the common femoral artery. 50-74% stenosis noted in the deep femoral artery. Total occlusion noted in the superficial femoral artery.  See table(s) above for measurements and observations. Electronically signed by Curt Jews MD on 01/16/2019 at 8:13:50 AM.    Final    Phillips Climes M.D on 02/08/2019 at 2:25 PM  Between 7am to 7pm - Pager - [phone]  After 7pm go to [email or web address] - password [facility]  [facility] -  Office  [phone]

## 2019-02-08 NOTE — Progress Notes (Signed)
Started 1 unit transfusion at 1746. Pt's IV began to drain. Stopped transfusion, IV removed. Obtaining a new IV for unit to be transfused.

## 2019-02-08 NOTE — Progress Notes (Signed)
Removed staples from patients left foot. Patient tolerated well.

## 2019-02-08 NOTE — Progress Notes (Signed)
Patient ID: Rebekah Burton, female   DOB: September 10, 1976, 43 y.o.   MRN: [id number] Just back from dialysis. Had oozing from vein harvest incision left thigh yesterday.  Old blood.  This is completely stopped with a slight separation.  Her left transmetatarsal amputation looks dramatically better than 48 hours ago.  A great deal of the fibrinous exudate as been removed with dressing changes.  Will remove staples due to wound dehiscence.  We will keep sutures in place.  Okay for discharge from vascular surgery standpoint.  I will see her in the office in 2 weeks for continued follow-up

## 2019-02-08 NOTE — Progress Notes (Signed)
Subjective:  Seen on HD, no c/o  Objective Vital signs in last 24 hours: Vitals:   02/08/19 1030 02/08/19 1100 02/08/19 1130 02/08/19 1305  BP: 133/88 (!) 158/100 (!) 149/93 (!) 139/94  Pulse: 80 80 76 87  Resp:    16  Temp:    98.4 F (36.9 C)  TempSrc:    Oral  SpO2:    100%  Weight:      Height:       Weight change:    Physical Exam: General: alert AAF in bed NAD Calm   Heart: RRR , No m,r,g Lungs: CTA unlabored breathing  Abdomen: Obese, bs pos , soft , NT, ND Extremities: L Transmet Amp dressing dry /clean , Left inner groin dressing clean , trace pedal edema L>R   Dialysis Access:l LUA AVF  Pos bruit   Home meds:  - amlodipine 10 / carvedilol 25 bid/ clonidine 0.2 tid  - renvela 2-3 ac tid/ renal vite  - aspirin 81  - oxycodone IR prn/ tramadol 50 qid prn  NW  Center MWF 4h 37min  88.5kg (leaving below 88.4 / 87.6 last 2 txs)  2/ 2.5 bath  Hep 7000  LUA AVF MIrcera 160mcg  q2wks ( last given 02/07) (hgb 8.4  02/05 )    Problem/Plan: 1. Wound dehiscence of L TMA (s/p fem-pop bypass/ TMA on 1/24): no surgery, per [facility]VS is improving w/ dressing chg's and IV abx 2. ESRD -  HD MWF  schedule. HD today.  3. Hypertension/volume  - BP stable ,on 3 Op bp meds , now only on Carvedilol 25 mg bid  And Clonidine 0.2 mg TID ( am bp 111/66)  decr clonidine to bid. Lower dry wt 2-3 kg at dc.  4. Anemia of ESRD   - HGB 8.4   esa due next week  5. Metabolic bone disease - Renvela as  binders , no vit d 6. PAD - sp LLE fem-pop BPG in Jan 2020   [facility] 02/08/2019, 1:46 PM    Labs: Basic Metabolic Panel: Recent Labs  Lab 02/05/19 1308 02/06/19 0328 02/08/19 0800  NA 138 138 138  K 3.9 3.5 4.2  CL 91* 96* 100  CO2 32 33* 25  GLUCOSE 118* 84 82  BUN 17 23* 21*  CREATININE 8.07* 9.30* 8.55*  CALCIUM 9.5 9.5 10.0  PHOS  --   --  3.4   Liver Function Tests: Recent Labs  Lab 02/05/19 1308 02/08/19 0800  AST 13*  --   ALT 7  --   ALKPHOS  41  --   BILITOT 0.6  --   PROT 6.3*  --   ALBUMIN 2.6* 2.6*   No results for input(s): LIPASE, AMYLASE in the last 168 hours. No results for input(s): AMMONIA in the last 168 hours. CBC: Recent Labs  Lab 02/05/19 1308 02/06/19 0328 02/08/19 0800  WBC 8.5 7.3 6.1  NEUTROABS 6.6  --   --   HGB 8.0* 8.4* 7.6*  HCT 26.1* 27.8* 25.1*  MCV 101.6* 99.3 101.6*  PLT 380 391 318   Medications: . ceFEPime (MAXIPIME) IV Stopped (02/06/19 1918)  . vancomycin 1,000 mg (02/08/19 1059)   . carvedilol  25 mg Oral BID WC  . docusate sodium  100 mg Oral BID  . nicotine  21 mg Transdermal Daily  . polyethylene glycol  17 g Oral Daily  . senna-docusate  3 tablet Oral BID  . sevelamer carbonate  3,200 mg  Oral TID WC

## 2019-02-09 LAB — CBC
HCT: 32 % — ABNORMAL LOW (ref 36.0–46.0)
Hemoglobin: 10.1 g/dL — ABNORMAL LOW (ref 12.0–15.0)
MCH: 30.1 pg (ref 26.0–34.0)
MCHC: 31.6 g/dL (ref 30.0–36.0)
MCV: 95.5 fL (ref 80.0–100.0)
Platelets: 330 10*3/uL (ref 150–400)
RBC: 3.35 MIL/uL — ABNORMAL LOW (ref 3.87–5.11)
RDW: 15.9 % — ABNORMAL HIGH (ref 11.5–15.5)
WBC: 8 10*3/uL (ref 4.0–10.5)
nRBC: 0 % (ref 0.0–0.2)

## 2019-02-09 LAB — TYPE AND SCREEN
ABO/RH(D): O POS
Antibody Screen: NEGATIVE
Unit division: 0

## 2019-02-09 LAB — BPAM RBC
Blood Product Expiration Date: 202003112359
ISSUE DATE / TIME: 202002141724
Unit Type and Rh: 5100

## 2019-02-09 MED ORDER — HEPARIN SODIUM (PORCINE) 5000 UNIT/ML IJ SOLN
5000.0000 [IU] | Freq: Three times a day (TID) | INTRAMUSCULAR | Status: DC
  Filled 2019-02-09 (×8): qty 1

## 2019-02-09 MED ORDER — OXYCODONE HCL 5 MG PO TABS: 5.0000 mg | ORAL_TABLET | ORAL | 0 refills | Status: DC | PRN

## 2019-02-09 MED ORDER — TRAMADOL HCL 50 MG PO TABS: 50.0000 mg | ORAL_TABLET | Freq: Four times a day (QID) | ORAL | 0 refills | Status: DC | PRN

## 2019-02-09 MED ORDER — SODIUM CHLORIDE 0.9 % IV SOLN: 2.0000 g | INTRAVENOUS | Status: AC

## 2019-02-09 MED ORDER — SENNOSIDES-DOCUSATE SODIUM 8.6-50 MG PO TABS: 1.0000 | ORAL_TABLET | Freq: Two times a day (BID) | ORAL | Status: DC

## 2019-02-09 NOTE — Care Management Important Message (Signed)
Important Message  Patient Details  Name: Rebekah Burton MRN: [id number] Date of Birth: Nov 29, 1976   Medicare Important Message Given:  Yes    Claudie Leach, RN 02/09/2019, 6:12 PM

## 2019-02-09 NOTE — Progress Notes (Signed)
Pt is appealing discharge to [facility].  Rebekah Burton is aware.  CSW will continue to follow for disposition planning.  Reed Breech LCSWA [phone]

## 2019-02-09 NOTE — Progress Notes (Signed)
Patient refusing to be discharge , SW, CM and MD made aware.

## 2019-02-09 NOTE — Care Management (Signed)
Call received from [place] and RN about patient concerns regarding discharging today to [facility], the SNF where she was PTA.  Dr. Waldron Labs aware of patient's concerns.  Spoke at length with patient about her concerns and what needed to be done to help her feel confident in the discharge plan.    Pt states she does not feel like she is ready to leave "because the pain in my foot makes me think something is wrong".  Pt states she was previously discharged with similar pain in her foot when she had to be readmitted for amputation.  Pt states "I will feel more ready to leave when my pain improves.  Discussed with patient that her IV antibiotics will continue for 9 more days and that the doctors believe that will be sufficient to treat her infections.  Her pain may improve with the antibiotics, but that she would continue to have pain for quite some time that may not be an indicator of healing vs. Non-healing.  Also, pt is concerned that the staff at the SNF will not follow d/c instructions.  Specifically, she states that Dr. Donnetta Hutching told her she should not bear any weight on her surgical foot.  Pt states that no other staff seem to know that and she fears that SNF staff will force her to bear weight on her surgical foot, impeding her healing.  Advised patient that I could clarify this with the doctor and the SNF, but she declined, saying "it won't do any good".    Medicare IM letter that patient signed at admission was printed and read to the patient, as the patient is blind.  The patient stated she would like to proceed with appeal.  [facility] number dialed for patient and patient spoke with [facility] representative.    After patient placed the call to [facility], Detailed Notice of Discharge printed and read to patient.  Pt signed and dated letter with my assistance.    Pt, MD and RN are aware that we may learn of [facility]'s decision on Sunday or Monday.    1700- Fax with case number ([id number]) received from  [facility].  Required information sent to [facility] via Epic.

## 2019-02-09 NOTE — Progress Notes (Signed)
Subjective:  Seen on HD, no c/o  Objective Vital signs in last 24 hours: Vitals:   02/08/19 1828 02/08/19 1849 02/08/19 2057 02/09/19 0542  BP: (!) 137/94 (!) 149/86 (!) 145/73 (!) 149/81  Pulse: 81 78 78 77  Resp: 16 14 16 17   Temp: 98.6 F (37 C) 98.4 F (36.9 C) 99.5 F (37.5 C) 98.1 F (36.7 C)  TempSrc: Oral Oral Oral Oral  SpO2: 100% 100% 100% 100%  Weight:   84.5 kg   Height:       Weight change:    Physical Exam: General: alert AAF in bed NAD Calm   Heart: RRR , No m,r,g Lungs: CTA unlabored breathing  Abdomen: Obese, bs pos , soft , NT, ND Extremities: L Transmet Amp dressing dry /clean , Left inner groin dressing clean , trace pedal edema L>R   Dialysis Access:l LUA AVF  Pos bruit   Home meds:  - amlodipine 10 / carvedilol 25 bid/ clonidine 0.2 tid  - renvela 2-3 ac tid/ renal vite  - aspirin 81  - oxycodone IR prn/ tramadol 50 qid prn  NW  Center MWF 4h 69min  88.5kg (leaving below 88.4 / 87.6 last 2 txs)  2/ 2.5 bath  Hep 7000  LUA AVF MIrcera 112mcg  q2wks ( last given 02/07) (hgb 8.4  02/05 )    Problem/Plan: 1. 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 ESRD -  HD MWF  schedule. HD Monday.  3. Hypertension/volume  - BP stable on coreg only.  Lower dry wt 2-3 kg at dc.  4. Anemia of ckd: hb 7 > 10 after 2u prbc yesterday 2/14. Esa due 2/21 if still here.  5. Metabolic bone disease - Renvela as  binders , no vit d 6. PAD - sp LLE fem-pop BPG in Jan 2020   [facility] 02/09/2019, 12:29 PM    Labs: Basic Metabolic Panel: Recent Labs  Lab 02/05/19 1308 02/06/19 0328 02/08/19 0800  NA 138 138 138  K 3.9 3.5 4.2  CL 91* 96* 100  CO2 32 33* 25  GLUCOSE 118* 84 82  BUN 17 23* 21*  CREATININE 8.07* 9.30* 8.55*  CALCIUM 9.5 9.5 10.0  PHOS  --   --  3.4   Liver Function Tests: Recent Labs  Lab 02/05/19 1308 02/08/19 0800  AST 13*  --   ALT 7   --   ALKPHOS 41  --   BILITOT 0.6  --   PROT 6.3*  --   ALBUMIN 2.6* 2.6*   No results for input(s): LIPASE, AMYLASE in the last 168 hours. No results for input(s): AMMONIA in the last 168 hours. CBC: Recent Labs  Lab 02/05/19 1308 02/06/19 0328 02/08/19 0800 02/09/19 0413  WBC 8.5 7.3 6.1 8.0  NEUTROABS 6.6  --   --   --   HGB 8.0* 8.4* 7.6* 10.1*  HCT 26.1* 27.8* 25.1* 32.0*  MCV 101.6* 99.3 101.6* 95.5  PLT 380 391 318 330   Medications: . ceFEPime (MAXIPIME) IV 2 g (02/08/19 2218)  . vancomycin Stopped (02/08/19 1110)   . carvedilol  25 mg Oral BID WC  . docusate sodium  100 mg Oral BID  . nicotine  21 mg Transdermal Daily  . polyethylene glycol  17 g Oral Daily  . senna-docusate  3 tablet Oral BID  . sevelamer carbonate  3,200 mg Oral  TID WC

## 2019-02-09 NOTE — Plan of Care (Signed)
°  Problem: Education: °Goal: Knowledge of General Education information will improve °Description: Including pain rating scale, medication(s)/side effects and non-pharmacologic comfort measures °Outcome: Progressing °  °Problem: Clinical Measurements: °Goal: Will remain free from infection °Outcome: Progressing °  °Problem: Pain Managment: °Goal: General experience of comfort will improve °Outcome: Progressing °  °Problem: Skin Integrity: °Goal: Risk for impaired skin integrity will decrease °Outcome: Progressing °  °

## 2019-02-09 NOTE — Discharge Instructions (Signed)
Follow with Primary MD or SNF physician   Activity: As tolerated with Full fall precautions use walker/cane & assistance as needed   Disposition SNF   Diet: Renal modified ,with 1200 cc fluid restriction, with feeding assistance and aspiration precautions.  For Heart failure patients - Check your Weight same time everyday, if you gain over 2 pounds, or you develop in leg swelling, experience more shortness of breath or chest pain, call your Primary MD immediately. Follow Cardiac Low Salt Diet and 1.5 lit/day fluid restriction.   On your next visit with your primary care physician please Get Medicines reviewed and adjusted.   Please request your Prim.MD to go over all Hospital Tests and Procedure/Radiological results at the follow up, please get all Hospital records sent to your Prim MD by signing hospital release before you go home.   If you experience worsening of your admission symptoms, develop shortness of breath, life threatening emergency, suicidal or homicidal thoughts you must seek medical attention immediately by calling 911 or calling your MD immediately  if symptoms less severe.  You Must read complete instructions/literature along with all the possible adverse reactions/side effects for all the Medicines you take and that have been prescribed to you. Take any new Medicines after you have completely understood and accpet all the possible adverse reactions/side effects.   Do not drive, operating heavy machinery, perform activities at heights, swimming or participation in water activities or provide baby sitting services if your were admitted for syncope or siezures until you have seen by Primary MD or a Neurologist and advised to do so again.  Do not drive when taking Pain medications.    Do not take more than prescribed Pain, Sleep and Anxiety Medications  Special Instructions: If you have smoked or chewed Tobacco  in the last 2 yrs please stop smoking, stop any regular  Alcohol  and or any Recreational drug use.  Wear Seat belts while driving.   Please note  You were cared for by a hospitalist during your hospital stay. If you have any questions about your discharge medications or the care you received while you were in the hospital after you are discharged, you can call the unit and asked to speak with the hospitalist on call if the hospitalist that took care of you is not available. Once you are discharged, your primary care physician will handle any further medical issues. Please note that NO REFILLS for any discharge medications will be authorized once you are discharged, as it is imperative that you return to your primary care physician (or establish a relationship with a primary care physician if you do not have one) for your aftercare needs so that they can reassess your need for medications and monitor your lab values.

## 2019-02-09 NOTE — Discharge Summary (Signed)
Rebekah Burton, is a 43 y.o. female  DOB 1976/05/10  MRN [id number].  Admission date:  02/05/2019  Admitting Physician  Rhetta Mura, DO  Discharge Date:  02/09/2019   Primary MD  Patient, [name]  Recommendations for primary care physician for things to follow:  -Patient to continue hemodialysis on Monday Wednesday Friday schedule, to continue with antibiotics cefepime plus hemodialysis date with stop date 02/18/2019 -Continue with wound care changes twice daily, please see setting instructions below -Follow-up as an outpatient with vascular surgery in 2 weeks  Admission Diagnosis  Wound infection [T14.8XXA, L08.9] Cellulitis of left foot [L03.116]   Discharge Diagnosis  Wound infection [T14.8XXA, L08.9] Cellulitis of left foot [L03.116]    Principal Problem:   Cellulitis of left lower extremity Active Problems:   ESRD (end stage renal disease) (HCC)   HTN (hypertension)   PAD (peripheral artery disease) (HCC)      Past Medical History:  Diagnosis Date  . Blind   . ESRD (end stage renal disease) ([place])   . Foot ulceration ([place]) 12/2018  . Hypertension   . PAD (peripheral artery disease) ([place])     Past Surgical History:  Procedure Laterality Date  . ABDOMINAL AORTOGRAM W/LOWER EXTREMITY Bilateral 01/17/2019   Procedure: ABDOMINAL AORTOGRAM W/LOWER EXTREMITY;  Surgeon: Waynetta Sandy, MD;  Location: [facility];  Service: Cardiovascular;  Laterality: Bilateral;  . ABDOMINAL HYSTERECTOMY    . ENDARTERECTOMY FEMORAL Left 01/18/2019   Procedure: ENDARTERECTOMY FEMORAL with Perfundaplasty;  Surgeon: Rosetta Posner, MD;  Location: [facility];  Service: Vascular;  Laterality: Left;  . FEMORAL-POPLITEAL BYPASS GRAFT Left 01/18/2019   Procedure: Left  FEMORAL- to  Below the Knee POPLITEAL ARTERY bypass using reversed safenous vein.;  Surgeon: Rosetta Posner, MD;  Location: [facility];   Service: Vascular;  Laterality: Left;  . toe removal Right    All toes on right foot have been removed.   . TRANSMETATARSAL AMPUTATION Left 01/18/2019   Procedure: TRANSMETATARSAL AMPUTATION;  Surgeon: Rosetta Posner, MD;  Location: [facility];  Service: Vascular;  Laterality: Left;       History of present illness and  Hospital Course:     Kindly see H&P for history of present illness and admission details, please review complete Labs, Consult reports and Test reports for all details in brief  HPI  from the history and physical done on the day of admission 02/05/2019 HPI: Rebekah Burton is a 43 y.o. female with medical history significant for end-stage renal disease on hemodialysis on Monday, Wednesday, Friday, peripheral artery disease status post recent left common femoral artery and deep femoral artery endarterectomy with vein patch angioplasty and a left femoral to below-knee popliteal artery bypass with vein graft, recent left metatarsal amputation, who is admitted to [facility] on 02/05/2019 with suspected left lower extremity cellulitis associated with wound dehiscence at site of recent left transmetatarsal amputation.  Patient was recently discharged on 01/26/2019 from Rebekah Burton following hospitalization in which she underwent  left common femoral artery and deep femoral artery endarterectomy with vein patch angioplasty and a left femoral to below-knee popliteal artery bypass with vein graft as well as left transmetatarsal amputation performed by Dr. Sherren Mocha Early on 01/18/19.   Over the last 2 to 3 days, the patient reports noting increased pain, redness, and inflammation at the site of the recent left transmetatarsal amputation.  She has been compliant in routinely attending wound clinic with most recent appointment occurring earlier on 02/05/2019, at which time wound care provider noted dehiscence of left transmetatarsal amputation wound, prompting recommendation for the patient  present to the emergency department for further evaluation.  The patient denies any recent subjective fever, chills, or rigors.  She denies rash in any other location.   Emergency department course: Vital signs in the emergency department were notable for the following: Temperature max 99, heart rate 71-79; blood pressure initially noted to be 113/69, which increased to 124/79 following initiation of IV antibiotics, as further described below; respiratory rate 15-19; oxygen saturation 93 to 100% on room air.  Labs performed in the ED were notable for the following: CMP notable for potassium 3.9.  CBC notable for white blood cell count of 8500 with 76% neutrophils.  Blood cultures x2 were collected prior to initiation of antibiotics. Dr. Scot Dock of vascular surgery evaluated the patient in the emergency department, and subsequently recommended admission to the hospital service for initiation of IV antibiotics, with plan for vascular surgery to continue to follow.  While still in the emergency department, the following were administered: Zosyn 3.376 g IV x1 as well as Dilaudid 0.5 mg IV x1.  Subsequently, the patient was admitted to the [place] floor for further evaluation and management of suspected left lower extremity cellulitis associated with wound dehiscence involving recent left transmetatarsal amputation.    Hospital Course   43 y.o.female with a ESRD, on dialysis MWF, CAD, hypertension, blind, with recent hospitalization secondary to gangrene of foot, status post revascularization of left lower extremity, with transmetatarsal amputation, , patient was admitted for wound dehiscence and surrounding cellulitis .     Left wound dehiscence with surrounding cellulitis  -Patient with recent transmetatarsal amputation, vascular surgery input greatly appreciated, at this point continue with wound care, daily dressing twice daily, empiric antibiotic coverage, discussed with vascular surgery,  who assessed the wound 02/08/2019, wounds appear to be improving, staples were removed 02/08/2019, arrange for a follow-up in 2 weeks as an outpatient. -Given recent wound culture showing Pseudomonas and E. coli, as well she was MRSA PCR positive, she was kept on cefepime and vancomycin during hospital stay, wound has significantly improved, will need total of another 10 days of IV antibiotics cefepime on discharge, discussed with renal, they will arrange postdialysis as an outpatient, with last dose on 02/18/2019 . -Change wound dressing twice daily  End-stage renal disease -Consulted, dialysis per renal, did receive dialysis today, to continue her Monday Wednesday Friday schedule after discharge  Anemia of ESRD -Hemoglobin is low at 7.6, she was transfused 1 unit PRBC 02/08/2019, with hemoglobin of 10.1 on discharge  PAD -status post left lower extremity femoropopliteal bypass in January 2020, vascular surgery on board  Tobacco abuse -on nicotine patch  Hypertension -At one point blood pressure has been soft during hospital stay, all meds has been stopped, blood pressure started to increase, she is back on Coreg, and amlodipine, clonidine has been stopped on discharge .    Discharge Condition:  Stable   Follow UP  Contact information for follow-up providers    Early, Arvilla Meres, MD Follow up in 2 week(s).   Specialties:  Vascular Surgery, Cardiology Contact information: [address] [place] [address] [phone]            Contact information for after-discharge care    Destination    [facility] Preferred SNF .   Service:  Skilled Nursing Contact information: [address] [place] [phone]                    Discharge Instructions  and  Discharge Medications     Discharge Instructions    Discharge instructions   Complete by:  As directed    Follow with Primary MD or SNF physician   Activity: As  tolerated with Full fall precautions use walker/cane & assistance as needed   Disposition SNF   Diet: Renal modified ,with 1200 cc fluid restriction, with feeding assistance and aspiration precautions.  For Heart failure patients - Check your Weight same time everyday, if you gain over 2 pounds, or you develop in leg swelling, experience more shortness of breath or chest pain, call your Primary MD immediately. Follow Cardiac Low Salt Diet and 1.5 lit/day fluid restriction.   On your next visit with your primary care physician please Get Medicines reviewed and adjusted.   Please request your Prim.MD to go over all Hospital Tests and Procedure/Radiological results at the follow up, please get all Hospital records sent to your Prim MD by signing hospital release before you go home.   If you experience worsening of your admission symptoms, develop shortness of breath, life threatening emergency, suicidal or homicidal thoughts you must seek medical attention immediately by calling 911 or calling your MD immediately  if symptoms less severe.  You Must read complete instructions/literature along with all the possible adverse reactions/side effects for all the Medicines you take and that have been prescribed to you. Take any new Medicines after you have completely understood and accpet all the possible adverse reactions/side effects.   Do not drive, operating heavy machinery, perform activities at heights, swimming or participation in water activities or provide baby sitting services if your were admitted for syncope or siezures until you have seen by Primary MD or a Neurologist and advised to do so again.  Do not drive when taking Pain medications.    Do not take more than prescribed Pain, Sleep and Anxiety Medications  Special Instructions: If you have smoked or chewed Tobacco  in the last 2 yrs please stop smoking, stop any regular Alcohol  and or any Recreational drug use.  Wear Seat belts  while driving.   Please note  You were cared for by a hospitalist during your hospital stay. If you have any questions about your discharge medications or the care you received while you were in the hospital after you are discharged, you can call the unit and asked to speak with the hospitalist on call if the hospitalist that took care of you is not available. Once you are discharged, your primary care physician will handle any further medical issues. Please note that NO REFILLS for any discharge medications will be authorized once you are discharged, as it is imperative that you return to your primary care physician (or establish a relationship with a primary care physician if you do not have one) for your aftercare needs so that they can reassess your need for medications and monitor your lab values.  Discharge wound care:   Complete by:  As directed    Wet to dry dressing change to open left transmetatarsal amputation wound twice daily.  Cover wound with moist 4 x 4's (normal saline), then wrapped with 4 x 4's, Kerlix, and 4 inch Ace.     Allergies as of 02/09/2019      Reactions   Percocet [oxycodone-acetaminophen] Hives      Medication List    STOP taking these medications   cloNIDine 0.2 MG tablet Commonly known as:  CATAPRES   GOODY HEADACHE PO     TAKE these medications   amLODipine 10 MG tablet Commonly known as:  NORVASC Take 10 mg by mouth daily.   aspirin 81 MG EC tablet Take 1 tablet (81 mg total) by mouth daily.   b complex-vitamin c-folic acid 0.8 MG Tabs tablet Take 1 tablet by mouth every Monday, Wednesday, and Friday with hemodialysis.   carvedilol 25 MG tablet Commonly known as:  COREG Take 25 mg by mouth 2 (two) times daily.   ceFEPIme 2 g in sodium chloride 0.9 % 100 mL Inject 2 g into the vein every Monday, Wednesday, and Friday at 6 PM for 7 days. Continue on her hemodialysis days, Monday, Wednesday, Friday, last dose on  02/18/2019 Start taking on:   February 11, 2019   lidocaine-prilocaine cream Commonly known as:  EMLA Apply 1 application topically every Monday, Wednesday, and Friday with hemodialysis.   oxyCODONE 5 MG immediate release tablet Commonly known as:  Oxy IR/ROXICODONE Take 1 tablet (5 mg total) by mouth every 4 (four) hours as needed for moderate pain. What changed:  how much to take   RENVELA 800 MG tablet Generic drug:  sevelamer carbonate Take 1,600-3,200 mg by mouth See admin instructions. Take 3,200 mg by mouth three times a day with meals and 1,600 mg with each snack   senna-docusate 8.6-50 MG tablet Commonly known as:  Senokot-S Take 1 tablet by mouth 2 (two) times daily.   traMADol 50 MG tablet Commonly known as:  ULTRAM Take 1 tablet (50 mg total) by mouth every 6 (six) hours as needed for moderate pain.            Discharge Care Instructions  (From admission, onward)         Start     Ordered   02/09/19 0000  Discharge wound care:    Comments:  Wet to dry dressing change to open left transmetatarsal amputation wound twice daily.  Cover wound with moist 4 x 4's (normal saline), then wrapped with 4 x 4's, Kerlix, and 4 inch Ace.   02/09/19 1237            Diet and Activity recommendation: See Discharge Instructions above   Consults obtained -  Vascular surgery   Major procedures and Radiology Reports - PLEASE review detailed and final reports for all details, in brief -   renal   Dg Foot Complete Left  Result Date: 02/05/2019 CLINICAL DATA:  Recent foot amputation, wound check, pain radiating from foot up to the thigh, end-stage renal disease on dialysis EXAM: LEFT FOOT - COMPLETE 3+ VIEW COMPARISON:  01/16/2019 FINDINGS: Interval transmetatarsal amputation LEFT foot. Bones demineralized. Soft tissue swelling at forefoot with skin clips noted. Slightly greater lucency and less well-defined margins are identified at the stump of the first metatarsal associated cortical definition  suspicious for osteomyelitis. No additional areas of bone destruction are identified. Plantar and Achilles insertion calcaneal spur formation. IMPRESSION:  Interval transmetatarsal amputation of the LEFT foot with significant soft tissue swelling. Poorly defined margins and cortical loss at the first metatarsal stump suspicious for osteomyelitis. Electronically Signed   By: Lavonia Dana M.D.   On: 02/05/2019 16:54   Dg Foot Complete Left  Result Date: 01/16/2019 CLINICAL DATA:  Left foot wound with black and 3rd through 5th toes. Wound present for 2 months. End-stage renal disease. EXAM: LEFT FOOT - COMPLETE 3+ VIEW COMPARISON:  Radiographs 01/15/2019. FINDINGS: Periarticular osteopenia again noted at the 2nd through 5th metatarsophalangeal joints without cortical destruction. There is no evidence of acute fracture or dislocation. There is stable spurring along the lateral base of the 1st proximal phalanx. There are no erosive changes. Forefoot soft tissue swelling appears stable without radiopaque foreign body or soft tissue emphysema. Calcaneal spurring and diffuse vascular calcifications are noted. IMPRESSION: Stable radiographs compared with prior study from yesterday. No radiographic evidence of osteomyelitis. Electronically Signed   By: Richardean Sale M.D.   On: 01/16/2019 19:35   Dg Foot Complete Left  Result Date: 01/15/2019 CLINICAL DATA:  Nonhealing wound on dorsal side of left foot. EXAM: LEFT FOOT - COMPLETE 3+ VIEW COMPARISON:  None. FINDINGS: Mild diffuse soft tissue swelling. There is periarticular osteopenia at the second through fifth MTP joints. No acute fractures, dislocations are areas of bone erosion identified. Posterior and plantar calcaneal heel spurs. IMPRESSION: 1. Soft tissue swelling. 2. No acute bone abnormality. Electronically Signed   By: Kerby Moors M.D.   On: 01/15/2019 20:07   Vas [place] Abi With/wo Tbi  Result Date: 01/22/2019 LOWER EXTREMITY DOPPLER STUDY Indications:  Peripheral artery disease.  Limitations: patient positioning and pain tolerance Performing Technologist: Abram Sander RVS  Examination Guidelines: A complete evaluation includes at minimum, Doppler waveform signals and systolic blood pressure reading at the level of bilateral brachial, anterior tibial, and posterior tibial arteries, when vessel segments are accessible. Bilateral testing is considered an integral part of a complete examination. Photoelectric Plethysmograph (PPG) waveforms and toe systolic pressure readings are included as required and additional duplex testing as needed. Limited examinations for reoccurring indications may be performed as noted.  ABI Findings: +--------+------------------+-----+---------+--------+ Right   Rt Pressure (mmHg)IndexWaveform Comment  +--------+------------------+-----+---------+--------+ Brachial130                    triphasic         +--------+------------------+-----+---------+--------+ PTA     255               1.96 biphasic          +--------+------------------+-----+---------+--------+ DP      255               1.96 biphasic          +--------+------------------+-----+---------+--------+ +--------+------------------+-----+-------------------+------------------------+ Left    Lt Pressure (mmHg)IndexWaveform           Comment                  +--------+------------------+-----+-------------------+------------------------+ Brachial                                          unable to obtain due to  fistula                  +--------+------------------+-----+-------------------+------------------------+ PTA     90                0.69 dampened monophasic                         +--------+------------------+-----+-------------------+------------------------+ DP                             dampened monophasicunable to obtain                                                            pressure due to pain                                                       tolerance                +--------+------------------+-----+-------------------+------------------------+ +-------+-----------+-----------+------------+------------+ ABI/TBIToday's ABIToday's TBIPrevious ABIPrevious TBI +-------+-----------+-----------+------------+------------+ Right  1.96                                           +-------+-----------+-----------+------------+------------+ Left   0.69                                           +-------+-----------+-----------+------------+------------+  Summary: Right: Resting right ankle-brachial index indicates noncompressible right lower extremity arteries. Left: Resting left ankle-brachial index indicates moderate left lower extremity arterial disease.  *See table(s) above for measurements and observations.  Electronically signed by Ruta Hinds MD on 01/22/2019 at 11:49:53 AM.   Final    Vas [place] Lower Extremity Arterial Duplex  Result Date: 01/16/2019 LOWER EXTREMITY ARTERIAL DUPLEX STUDY Indications: Peripheral artery disease, and Left non-healing foot ulcer.  Vascular Interventions: Right Fem-BK Pop BPG placed 2018 per patient at outside                         facility. Current ABI:            Unknown Performing Technologist: Caralee Ates BA, RVT, RDMS  Examination Guidelines: A complete evaluation includes B-mode imaging, spectral Doppler, color Doppler, and power Doppler as needed of all accessible portions of each vessel. Bilateral testing is considered an integral part of a complete examination. Limited examinations for reoccurring indications may be performed as noted.  Right Duplex Findings: +-----------+--------+-----+--------+----------+--------------+            PSV cm/sRatioStenosisWaveform  Comments       +-----------+--------+-----+--------+----------+--------------+ ATA Distal 56                   monophasic                +-----------+--------+-----+--------+----------+--------------+ PTA Mid    80  biphasic                 +-----------+--------+-----+--------+----------+--------------+ PTA Distal 85                   monophasic               +-----------+--------+-----+--------+----------+--------------+ PERO Distal                               Not visualized +-----------+--------+-----+--------+----------+--------------+  Right Graft #1: Femoral artery - Below knee popliteal artery +------------------+--------+--------+---------+------------+                   PSV cm/sStenosisWaveform Comments     +------------------+--------+--------+---------+------------+ Inflow            65              triphasic             +------------------+--------+--------+---------+------------+ Prox Anastomosis  121             triphasic             +------------------+--------+--------+---------+------------+ Proximal Graft    136             triphasic             +------------------+--------+--------+---------+------------+ Mid Graft         82              triphasic             +------------------+--------+--------+---------+------------+ Distal Graft      111             triphasicValves noted +------------------+--------+--------+---------+------------+ Distal Anastomosis78              triphasic             +------------------+--------+--------+---------+------------+ Outflow           74              triphasic             +------------------+--------+--------+---------+------------+  Left Duplex Findings: +-----------+--------+-----+---------------+-------------------+--------+            PSV cm/sRatioStenosis       Waveform           Comments +-----------+--------+-----+---------------+-------------------+--------+ CFA Distal 221          50-74% stenosismonophasic                   +-----------+--------+-----+---------------+-------------------+--------+ DFA        259          50-74% stenosismonophasic                  +-----------+--------+-----+---------------+-------------------+--------+ SFA Prox   21                                                      +-----------+--------+-----+---------------+-------------------+--------+ SFA Mid                 occluded                                   +-----------+--------+-----+---------------+-------------------+--------+ SFA Distal 11  monophasic                  +-----------+--------+-----+---------------+-------------------+--------+ POP Prox   19                          monophasic                  +-----------+--------+-----+---------------+-------------------+--------+ POP Mid    18                          monophasic                  +-----------+--------+-----+---------------+-------------------+--------+ POP Distal 15                          monophasic                  +-----------+--------+-----+---------------+-------------------+--------+ ATA Distal 15                          monophasic                  +-----------+--------+-----+---------------+-------------------+--------+ PTA Prox   16                          monophasic                  +-----------+--------+-----+---------------+-------------------+--------+ PTA Mid    16                          monophasic                  +-----------+--------+-----+---------------+-------------------+--------+ PTA Distal 21                          monophasic                  +-----------+--------+-----+---------------+-------------------+--------+ PERO Prox  12                          dampened monophasic         +-----------+--------+-----+---------------+-------------------+--------+ PERO Mid   9                           monophasic                   +-----------+--------+-----+---------------+-------------------+--------+ PERO Distal12                          monophasic                  +-----------+--------+-----+---------------+-------------------+--------+  Findings reported to Vivien Rota at Dr. Dellia Nims at 4:11 pm.  Summary: Right Graft(s): Patent right femoral - below knee popliteal BPG without evidence of stenosis. Left: 50-74% stenosis noted in the common femoral artery. 50-74% stenosis noted in the deep femoral artery. Total occlusion noted in the superficial femoral artery.  See table(s) above for measurements and observations. Electronically signed by Curt Jews MD on 01/16/2019 at 8:13:50 AM.    Final     Micro Results    Recent Results (from the past 240 hour(s))  Blood culture (routine x 2)     Status: None (Preliminary result)  Collection Time: 02/05/19  6:17 PM  Result Value Ref Range Status   Specimen Description SITE NOT SPECIFIED  Final   Special Requests   Final    BOTTLES DRAWN AEROBIC ONLY Blood Culture results may not be optimal due to an inadequate volume of blood received in culture bottles   Culture   Final    NO GROWTH 4 DAYS Performed at [facility], [place] [address], [place], [place] [address]    Report Status PENDING  Incomplete  Blood culture (routine x 2)     Status: None (Preliminary result)   Collection Time: 02/05/19  8:04 PM  Result Value Ref Range Status   Specimen Description BLOOD RIGHT HAND  Final   Special Requests   Final    BOTTLES DRAWN AEROBIC ONLY Blood Culture adequate volume   Culture   Final    NO GROWTH 4 DAYS Performed at [facility], [place] [address], [place], [place] [address]    Report Status PENDING  Incomplete       Today   Subjective:   Berma Harts today has no headache,no chest or abdominal pain, no fever, no chills  Objective:   Blood pressure (!) 149/81, pulse 77, temperature 98.1 F (36.7 C), temperature source Oral, resp. rate 17, height 5\' 4"   (1.626 m), weight 84.5 kg, SpO2 100 %.   Intake/Output Summary (Last 24 hours) at 02/09/2019 1247 Last data filed at 02/08/2019 2057 Gross per 24 hour  Intake 452.81 ml  Output -  Net 452.81 ml    Exam Awake Alert, Oriented x 3, No new F.N deficits, Normal affect, patient is legally blind Symmetrical Chest wall movement, Good air movement bilaterally, CTAB RRR,No Gallops,Rubs or new Murmurs, No Parasternal Heave +ve B.Sounds, Abd Soft, Non tender, , No rebound -guarding or rigidity. Left foot status post transmetatarsal amputation, bandaged  Data Review   CBC w Diff:  Lab Results  Component Value Date   WBC 8.0 02/09/2019   HGB 10.1 (L) 02/09/2019   HCT 32.0 (L) 02/09/2019   PLT 330 02/09/2019   LYMPHOPCT 12 02/05/2019   MONOPCT 6 02/05/2019   EOSPCT 4 02/05/2019   BASOPCT 1 02/05/2019    CMP:  Lab Results  Component Value Date   NA 138 02/08/2019   K 4.2 02/08/2019   CL 100 02/08/2019   CO2 25 02/08/2019   BUN 21 (H) 02/08/2019   CREATININE 8.55 (H) 02/08/2019   PROT 6.3 (L) 02/05/2019   ALBUMIN 2.6 (L) 02/08/2019   BILITOT 0.6 02/05/2019   ALKPHOS 41 02/05/2019   AST 13 (L) 02/05/2019   ALT 7 02/05/2019  .   Total Time in preparing paper work, data evaluation and todays exam - 108 minutes  Phillips Climes M.D on 02/09/2019 at 12:47 PM  [facility]   Office  [phone]

## 2019-02-10 LAB — CULTURE, BLOOD (ROUTINE X 2)
Culture: NO GROWTH
Culture: NO GROWTH
Special Requests: ADEQUATE

## 2019-02-10 MED ORDER — AMLODIPINE BESYLATE 10 MG PO TABS
10.0000 mg | ORAL_TABLET | Freq: Every day | ORAL | Status: DC
  Administered 2019-02-10 – 2019-02-13 (×4): 10 mg via ORAL
  Filled 2019-02-10 (×4): qty 1

## 2019-02-10 MED ORDER — CHLORHEXIDINE GLUCONATE CLOTH 2 % EX PADS
6.0000 | MEDICATED_PAD | Freq: Every day | CUTANEOUS | Status: DC
  Administered 2019-02-10 – 2019-02-13 (×4): 6 via TOPICAL

## 2019-02-10 NOTE — Progress Notes (Signed)
Subjective:  Seen on HD, no c/o  Objective Vital signs in last 24 hours: Vitals:   02/09/19 0542 02/09/19 1714 02/09/19 2146 02/10/19 0500  BP: (!) 149/81 (!) 155/103 (!) 162/100 (!) 156/99  Pulse: 77 85 84 86  Resp: 17 17 17 18   Temp: 98.1 F (36.7 C) 98.4 F (36.9 C) 98.2 F (36.8 C) 98.4 F (36.9 C)  TempSrc: Oral Oral Oral Oral  SpO2: 100% 100% 99% 98%  Weight:      Height:       Weight change:    Physical Exam: General: alert AAF in bed NAD Calm   Heart: RRR , No m,r,g Lungs: CTA unlabored breathing  Abdomen: Obese, bs pos , soft , NT, ND Extremities: L Transmet Amp dressing dry /clean , Left inner groin dressing clean , trace pedal edema L>R   Dialysis Access:l LUA AVF  Pos bruit   Home meds:  - amlodipine 10 / carvedilol 25 bid/ clonidine 0.2 tid  - renvela 2-3 ac tid/ renal vite  - aspirin 81  - oxycodone IR prn/ tramadol 50 qid prn  NW MWF 4h 6min  88.5kg (leaving below 88.4 / 87.6 last 2 txs)  2/ 2.5 bath  Hep 7000  LUA AVF MIrcera 153mcg  q2wks ( last given 02/07) (hgb 8.4  02/05 )    Problem/Plan: 1. 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 ESRD -  HD MWF. HD tomorrow. 3. Hypertension/volume  - BP's up on coreg, will resume amlodipine today.  Lower dry wt 2-3 kg at dc.  4. Anemia of ckd: hb 7 > 10 after 2u prbc 2/14. Esa due 2/21 if still here.  5. Metabolic bone disease - Renvela as  binders , no vit d 6. PAD - sp LLE fem-pop BPG in Jan 2020   [facility] 02/10/2019, 11:28 AM    Labs: Basic Metabolic Panel: Recent Labs  Lab 02/05/19 1308 02/06/19 0328 02/08/19 0800  NA 138 138 138  K 3.9 3.5 4.2  CL 91* 96* 100  CO2 32 33* 25  GLUCOSE 118* 84 82  BUN 17 23* 21*  CREATININE 8.07* 9.30* 8.55*  CALCIUM 9.5 9.5 10.0  PHOS  --   --  3.4   Liver Function Tests: Recent Labs  Lab 02/05/19 1308 02/08/19 0800  AST 13*  --   ALT 7  --    ALKPHOS 41  --   BILITOT 0.6  --   PROT 6.3*  --   ALBUMIN 2.6* 2.6*   No results for input(s): LIPASE, AMYLASE in the last 168 hours. No results for input(s): AMMONIA in the last 168 hours. CBC: Recent Labs  Lab 02/05/19 1308 02/06/19 0328 02/08/19 0800 02/09/19 0413  WBC 8.5 7.3 6.1 8.0  NEUTROABS 6.6  --   --   --   HGB 8.0* 8.4* 7.6* 10.1*  HCT 26.1* 27.8* 25.1* 32.0*  MCV 101.6* 99.3 101.6* 95.5  PLT 380 391 318 330   Medications: . ceFEPime (MAXIPIME) IV 2 g (02/08/19 2218)  . vancomycin Stopped (02/08/19 1110)   . carvedilol  25 mg Oral BID WC  . docusate sodium  100 mg Oral BID  . heparin injection (subcutaneous)  5,000 Units Subcutaneous Q8H  . nicotine  21 mg Transdermal Daily  . polyethylene glycol  17 g Oral Daily  . senna-docusate  3 tablet Oral BID  .  sevelamer carbonate  3,200 mg Oral TID WC

## 2019-02-10 NOTE — Evaluation (Addendum)
Physical Therapy Evaluation Patient Details Name: Rebekah Burton MRN: [id number] DOB: [birth year] Today's Date: 02/10/2019   History of Present Illness  43 y.o. patient s/p L transmet amp and fem-pop bypass 1/14. PMH includes: R transmet amputation, Blindness, ESRD, HTN, PAD. Patient is WB through LLE heel with Darco shoe only per MD.   Clinical Impression   Pt admitted with above diagnosis. Pt currently with functional limitations due to the deficits listed below (see PT Problem List). Came from SNF where she was getting therapy services; Presents with decr functional mobility; Overall quite good bed mobility; Rebekah Burton is extremely hesitant to get back to working on standing; Took time to educate that it is ok for her to put weight on LLE-- just only through the heel; Darco shoe not in room; Asked Dr. [facility] for an order for another Darco shoe to keep weight off of her forefoot;  Pt will benefit from skilled PT to increase their independence and safety with mobility to allow discharge to the venue listed below.    I anticipate that an HEP for strengthening will be important for Rebekah Burton because her goal is to walk again, but she is so hesitant to work on standing.     Follow Up Recommendations SNF    Equipment Recommendations  Wheelchair (measurements PT);Wheelchair cushion (measurements PT)    Recommendations for Other Services       Precautions / Restrictions Precautions Precautions: Fall Precaution Comments: Blind Required Braces or Orthoses: Other Brace Other Brace: L darco(Not in her room on PT eval) Restrictions LLE Weight Bearing: Partial weight bearing Other Position/Activity Restrictions: Through Heel LLE in [facility] shoe      Mobility  Bed Mobility Overal bed mobility: Needs Assistance Bed Mobility: Supine to Sit;Sit to Supine     Supine to sit: Supervision Sit to supine: Supervision   General bed mobility comments: Pulls to long sit very well, and turned to EOB  without physical assist; supervision for safety  Transfers Overall transfer level: Needs assistance   Transfers: Lateral/Scoot Transfers          Lateral/Scoot Transfers: Supervision General transfer comment: opted for simulation of lateral scoot transfers becase no Darco shoe available, and Rebekah Burton is very concerned about re-injuring the surgical site; Overall VERY good scooting to foot of bed, and then back to head of bed; we discussed using the anterior-posterior transfer OOB to recliner, or even to use bedside commode  Ambulation/Gait                Stairs            Wheelchair Mobility    Modified Rankin (Stroke Patients Only)       Balance     Sitting balance-Leahy Scale: Good                                       Pertinent Vitals/Pain Pain Assessment: 0-10 Pain Score: 7  Pain Location: LLE, especially in dependent position Pain Descriptors / Indicators: Grimacing;Constant;Guarding Pain Intervention(s): Monitored during session    Home Living Family/patient expects to be discharged to:: Skilled nursing facility Living Arrangements: Other relatives(lives with niece)               Additional Comments: Patient lives in 2nd story apartment with niece, has RW only (taken from previous admission)    Prior Function Level of Independence: Needs assistance   Gait /  Transfers Assistance Needed: household ambulation with niece hands on guarding due to blind. does not moblize much without family or home aide present.  ADL's / Homemaking Assistance Needed: assist from niece or home aide for IADL; pt was completing bathing dressing after set up  Comments: PLOF taken from chart review     Hand Dominance   Dominant Hand: Right    Extremity/Trunk Assessment   Upper Extremity Assessment Upper Extremity Assessment: Overall WFL for tasks assessed    Lower Extremity Assessment Lower Extremity Assessment: RLE deficits/detail;LLE  deficits/detail RLE Deficits / Details: Previous transmet amputation LLE Deficits / Details: Foot dressed and wrapped; noted scabbing from previous revascularization surgeries, and a foam dressing proximal inner thigh; overall, moves LLE well; more pain/throbbing when LE is in dependent position; good hamstring length       Communication   Communication: No difficulties  Cognition Arousal/Alertness: Awake/alert Behavior During Therapy: WFL for tasks assessed/performed Overall Cognitive Status: Within Functional Limits for tasks assessed                                        General Comments      Exercises     Assessment/Plan    PT Assessment Patient needs continued PT services  PT Problem List Pain;Decreased activity tolerance;Decreased knowledge of precautions;Decreased knowledge of use of DME;Decreased mobility       PT Treatment Interventions DME instruction;Gait training;Stair training;Functional mobility training    PT Goals (Current goals can be found in the Care Plan section)  Acute Rehab PT Goals Patient Stated Goal: heal that surgery site PT Goal Formulation: With patient Time For Goal Achievement: 02/24/19 Potential to Achieve Goals: Good    Frequency Min 2X/week   Barriers to discharge        Co-evaluation               AM-PAC PT "6 Clicks" Mobility  Outcome Measure Help needed turning from your back to your side while in a flat bed without using bedrails?: None Help needed moving from lying on your back to sitting on the side of a flat bed without using bedrails?: None Help needed moving to and from a bed to a chair (including a wheelchair)?: A Little Help needed standing up from a chair using your arms (e.g., wheelchair or bedside chair)?: A Lot Help needed to walk in hospital room?: Total Help needed climbing 3-5 steps with a railing? : Total 6 Click Score: 15    End of Session Equipment Utilized During Treatment: Gait  belt Activity Tolerance: Patient tolerated treatment well Patient left: with call bell/phone within reach;in bed Nurse Communication: Mobility status PT Visit Diagnosis: Unsteadiness on feet (R26.81);Other abnormalities of gait and mobility (R26.89)    Time: 1401-1420 PT Time Calculation (min) (ACUTE ONLY): 19 min   Charges:   PT Evaluation $PT Eval Low Complexity: [place], PT  Acute Rehabilitation Services Pager [phone] Office [place] 02/10/2019, 4:11 PM

## 2019-02-10 NOTE — Progress Notes (Signed)
Pt resting/sleeping comfortable this a.m.Marland KitchenPt stated that she had a BM last night. Appetite good for breakfast.

## 2019-02-10 NOTE — Progress Notes (Signed)
PROGRESS NOTE                                                                                                                                                                                                             Patient Demographics:    Rebekah Burton, is a 43 y.o. female, DOB - 1976-03-21, SHF:[id number]  Admit date - 02/05/2019   Admitting Physician Rhetta Mura, DO  Outpatient Primary MD for the patient is Patient, [name]  LOS - 5   Chief Complaint  Patient presents with  . Wound Check       Brief Narrative    43 y.o. female  with a ESRD, on dialysis MWF, CAD, hypertension, blind, with recent hospitalization secondary to gangrene of foot, status post revascularization of left lower extremity, with transmetatarsal amputation, , patient was admitted for wound dehiscence and surrounding cellulitis .   Subjective:    Rebekah Burton today has any fever or chills, reports pain in left foot.   Assessment  & Plan :    Principal Problem:   Cellulitis of left lower extremity Active Problems:   ESRD (end stage renal disease) (HCC)   HTN (hypertension)   PAD (peripheral artery disease) (HCC)     Left wound dehiscence with surrounding cellulitis  -Patient with recent transmetatarsal amputation, vascular surgery input greatly appreciated, at this point continue with wound care, daily dressing twice daily, empiric antibiotic coverage,discussed with vascular surgery, who assessed the wound 02/08/2019, wounds appear to be improving, staples were removed 02/08/2019, arrange for a follow-up in 2 weeks as an outpatient. -Given recent wound culture showing Pseudomonas and E. coli, as well she was MRSA PCR positive, she was kept on cefepime and vancomycin during hospital stay, wound has significantly improved, will need total of another 10 days of IV antibiotics cefepime on discharge, discussed with renal, they will arrange postdialysis as an  outpatient, with last dose on 02/18/2019 . -Change wound dressing twice daily   End-stage renal disease -Consulted, dialysis per renal, did receive dialysis today, to continue her Monday Wednesday Friday schedule after discharge  Anemia of ESRD -Hemoglobin is low at 7.6, she was transfused 1 unit PRBC 02/08/2019, with hemoglobin of 10.1 on discharge  PAD -status post left lower extremity femoropopliteal bypass in January 2020, vascular surgery on board  Tobacco abuse -on  nicotine patch  Hypertension -Blood pressure is soft, so I have stopped her Coreg ,  clonidine and amlodipine    Code Status : Full  Family Communication  : None at bedside  Disposition Plan  : back to SNF  Consults  :  Vascular, renal  Procedures  : None  DVT Prophylaxis  :  [place] heparin  Lab Results  Component Value Date   PLT 330 02/09/2019    Antibiotics  :    Anti-infectives (From admission, onward)   Start     Dose/Rate Route Frequency Ordered Stop   02/11/19 0000  ceFEPIme 2 g in sodium chloride 0.9 % 100 mL     2 g 200 mL/hr over 30 Minutes Intravenous Every M-W-F (1800) 02/09/19 1237 02/18/19 2359   02/08/19 1200  vancomycin (VANCOCIN) IVPB 1000 mg/200 mL premix     1,000 mg 200 mL/hr over 60 Minutes Intravenous Every M-W-F (Hemodialysis) 02/06/19 1346     02/06/19 1800  vancomycin (VANCOCIN) 2,000 mg in sodium chloride 0.9 % 500 mL IVPB     2,000 mg 250 mL/hr over 120 Minutes Intravenous  Once 02/06/19 1346 02/06/19 2150   02/06/19 1800  ceFEPIme (MAXIPIME) 2 g in sodium chloride 0.9 % 100 mL IVPB     2 g 200 mL/hr over 30 Minutes Intravenous Every M-W-F (1800) 02/06/19 1346     02/05/19 1830  piperacillin-tazobactam (ZOSYN) IVPB 3.375 g     3.375 g 100 mL/hr over 30 Minutes Intravenous  Once 02/05/19 1820 02/05/19 1940        Objective:   Vitals:   02/09/19 0542 02/09/19 1714 02/09/19 2146 02/10/19 0500  BP: (!) 149/81 (!) 155/103 (!) 162/100 (!) 156/99  Pulse: 77 85 84 86    Resp: 17 17 17 18   Temp: 98.1 F (36.7 C) 98.4 F (36.9 C) 98.2 F (36.8 C) 98.4 F (36.9 C)  TempSrc: Oral Oral Oral Oral  SpO2: 100% 100% 99% 98%  Weight:      Height:        Wt Readings from Last 3 Encounters:  02/08/19 84.5 kg  01/25/19 88.1 kg  07/17/18 93.9 kg     Intake/Output Summary (Last 24 hours) at 02/10/2019 1235 Last data filed at 02/10/2019 0900 Gross per 24 hour  Intake 240 ml  Output -  Net 240 ml     Physical Exam  Sleeping comfortably in her bed, legally blind  Good entry bilaterally, no wheezing  Regular rate and rhythm, no rubs or gallops  Abdomen soft, nontender  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, left foot bandaged with Ace wrap  Patient was seen and examined with the presence of her nurse Mardene Celeste      Data Review:    CBC Recent Labs  Lab 02/05/19 1308 02/06/19 0328 02/08/19 0800 02/09/19 0413  WBC 8.5 7.3 6.1 8.0  HGB 8.0* 8.4* 7.6* 10.1*  HCT 26.1* 27.8* 25.1* 32.0*  PLT 380 391 318 330  MCV 101.6* 99.3 101.6* 95.5  MCH 31.1 30.0 30.8 30.1  MCHC 30.7 30.2 30.3 31.6  RDW 14.3 14.2 14.6 15.9*  LYMPHSABS 1.0  --   --   --   MONOABS 0.6  --   --   --   EOSABS 0.3  --   --   --   BASOSABS 0.0  --   --   --     Chemistries  Recent Labs  Lab 02/05/19 1308 02/06/19 0328 02/08/19 0800  NA 138  138 138  K 3.9 3.5 4.2  CL 91* 96* 100  CO2 32 33* 25  GLUCOSE 118* 84 82  BUN 17 23* 21*  CREATININE 8.07* 9.30* 8.55*  CALCIUM 9.5 9.5 10.0  MG  --  2.2  --   AST 13*  --   --   ALT 7  --   --   ALKPHOS 41  --   --   BILITOT 0.6  --   --    ------------------------------------------------------------------------------------------------------------------ No results for input(s): CHOL, HDL, LDLCALC, TRIG, CHOLHDL, LDLDIRECT in the last 72 hours.  Lab Results  Component Value Date   HGBA1C 5.2 01/16/2019    ------------------------------------------------------------------------------------------------------------------ No results for input(s): TSH, T4TOTAL, T3FREE, THYROIDAB in the last 72 hours.  Invalid input(s): FREET3 ------------------------------------------------------------------------------------------------------------------ No results for input(s): VITAMINB12, FOLATE, FERRITIN, TIBC, IRON, RETICCTPCT in the last 72 hours.  Coagulation profile No results for input(s): INR, PROTIME in the last 168 hours.  No results for input(s): DDIMER in the last 72 hours.  Cardiac Enzymes No results for input(s): CKMB, TROPONINI, MYOGLOBIN in the last 168 hours.  Invalid input(s): CK ------------------------------------------------------------------------------------------------------------------ No results found for: BNP  Inpatient Medications  Scheduled Meds: . amLODipine  10 mg Oral Daily  . carvedilol  25 mg Oral BID WC  . Chlorhexidine Gluconate Cloth  6 each Topical Q0600  . docusate sodium  100 mg Oral BID  . heparin injection (subcutaneous)  5,000 Units Subcutaneous Q8H  . nicotine  21 mg Transdermal Daily  . polyethylene glycol  17 g Oral Daily  . senna-docusate  3 tablet Oral BID  . sevelamer carbonate  3,200 mg Oral TID WC   Continuous Infusions: . ceFEPime (MAXIPIME) IV 2 g (02/08/19 2218)  . vancomycin Stopped (02/08/19 1110)   PRN Meds:.HYDROcodone-acetaminophen, nicotine, sevelamer carbonate, sorbitol, traMADol  Micro Results Recent Results (from the past 240 hour(s))  Blood culture (routine x 2)     Status: None   Collection Time: 02/05/19  6:17 PM  Result Value Ref Range Status   Specimen Description SITE NOT SPECIFIED  Final   Special Requests   Final    BOTTLES DRAWN AEROBIC ONLY Blood Culture results may not be optimal due to an inadequate volume of blood received in culture bottles   Culture   Final    NO GROWTH 5 DAYS Performed at [place], [place] [address], [place], [place] [address]    Report Status 02/10/2019 FINAL  Final  Blood culture (routine x 2)     Status: None   Collection Time: 02/05/19  8:04 PM  Result Value Ref Range Status   Specimen Description BLOOD RIGHT HAND  Final   Special Requests   Final    BOTTLES DRAWN AEROBIC ONLY Blood Culture adequate volume   Culture   Final    NO GROWTH 5 DAYS Performed at [facility], [place] [address] [place], [place] [address]    Report Status 02/10/2019 FINAL  Final    Radiology Reports Dg Foot Complete Left  Result Date: 02/05/2019 CLINICAL DATA:  Recent foot amputation, wound check, pain radiating from foot up to the thigh, end-stage renal disease on dialysis EXAM: LEFT FOOT - COMPLETE 3+ VIEW COMPARISON:  01/16/2019 FINDINGS: Interval transmetatarsal amputation LEFT foot. Bones demineralized. Soft tissue swelling at forefoot with skin clips noted. Slightly greater lucency and less well-defined margins are identified at the stump of the first metatarsal associated cortical definition suspicious for osteomyelitis. No additional areas of  bone destruction are identified. Plantar and Achilles insertion calcaneal spur formation. 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 Poorly defined margins and cortical loss at the first metatarsal stump suspicious for osteomyelitis. Electronically Signed   By: Lavonia Dana M.D.   On: 02/05/2019 16:54   Dg Foot Complete Left  Result Date: 01/16/2019 CLINICAL DATA:  Left foot wound with black and 3rd through 5th toes. Wound present for 2 months. End-stage renal disease. EXAM: LEFT FOOT - COMPLETE 3+ VIEW COMPARISON:  Radiographs 01/15/2019. FINDINGS: Periarticular osteopenia again noted at the 2nd through 5th metatarsophalangeal joints without cortical destruction. There is no evidence of acute fracture or dislocation. There is stable spurring along the lateral base of the 1st proximal  phalanx. There are no erosive changes. Forefoot soft tissue swelling appears stable without radiopaque foreign body or soft tissue emphysema. Calcaneal spurring and diffuse vascular calcifications are noted. IMPRESSION: Stable radiographs compared with prior study from yesterday. No radiographic evidence of osteomyelitis. Electronically Signed   By: Richardean Sale M.D.   On: 01/16/2019 19:35   Dg Foot Complete Left  Result Date: 01/15/2019 CLINICAL DATA:  Nonhealing wound on dorsal side of left foot. EXAM: LEFT FOOT - COMPLETE 3+ VIEW COMPARISON:  None. FINDINGS: Mild diffuse soft tissue swelling. There is periarticular osteopenia at the second through fifth MTP joints. No acute fractures, dislocations are areas of bone erosion identified. Posterior and plantar calcaneal heel spurs. IMPRESSION: 1. Soft tissue swelling. 2. No acute bone abnormality. Electronically Signed   By: Kerby Moors M.D.   On: 01/15/2019 20:07   Vas [place] Abi With/wo Tbi  Result Date: 01/22/2019 LOWER EXTREMITY DOPPLER STUDY Indications: Peripheral artery disease.  Limitations: patient positioning and pain tolerance Performing Technologist: Abram Sander RVS  Examination Guidelines: A complete evaluation includes at minimum, Doppler waveform signals and systolic blood pressure reading at the level of bilateral brachial, anterior tibial, and posterior tibial arteries, when vessel segments are accessible. Bilateral testing is considered an integral part of a complete examination. Photoelectric Plethysmograph (PPG) waveforms and toe systolic pressure readings are included as required and additional duplex testing as needed. Limited examinations for reoccurring indications may be performed as noted.  ABI Findings: +--------+------------------+-----+---------+--------+ Right   Rt Pressure (mmHg)IndexWaveform Comment  +--------+------------------+-----+---------+--------+ Brachial130                    triphasic          +--------+------------------+-----+---------+--------+ PTA     255               1.96 biphasic          +--------+------------------+-----+---------+--------+ DP      255               1.96 biphasic          +--------+------------------+-----+---------+--------+ +--------+------------------+-----+-------------------+------------------------+ Left    Lt Pressure (mmHg)IndexWaveform           Comment                  +--------+------------------+-----+-------------------+------------------------+ Brachial                                          unable to obtain due to  fistula                  +--------+------------------+-----+-------------------+------------------------+ PTA     90                0.69 dampened monophasic                         +--------+------------------+-----+-------------------+------------------------+ DP                             dampened monophasicunable to obtain                                                           pressure due to pain                                                       tolerance                +--------+------------------+-----+-------------------+------------------------+ +-------+-----------+-----------+------------+------------+ ABI/TBIToday's ABIToday's TBIPrevious ABIPrevious TBI +-------+-----------+-----------+------------+------------+ Right  1.96                                           +-------+-----------+-----------+------------+------------+ Left   0.69                                           +-------+-----------+-----------+------------+------------+  Summary: Right: Resting right ankle-brachial index indicates noncompressible right lower extremity arteries. Left: Resting left ankle-brachial index indicates moderate left lower extremity arterial disease.  *See table(s) above for measurements and observations.  Electronically  signed by Ruta Hinds MD on 01/22/2019 at 11:49:53 AM.   Final    Vas [place] Lower Extremity Arterial Duplex  Result Date: 01/16/2019 LOWER EXTREMITY ARTERIAL DUPLEX STUDY Indications: Peripheral artery disease, and Left non-healing foot ulcer.  Vascular Interventions: Right Fem-BK Pop BPG placed 2018 per patient at outside                         facility. Current ABI:            Unknown Performing Technologist: Caralee Ates BA, RVT, RDMS  Examination Guidelines: A complete evaluation includes B-mode imaging, spectral Doppler, color Doppler, and power Doppler as needed of all accessible portions of each vessel. Bilateral testing is considered an integral part of a complete examination. Limited examinations for reoccurring indications may be performed as noted.  Right Duplex Findings: +-----------+--------+-----+--------+----------+--------------+            PSV cm/sRatioStenosisWaveform  Comments       +-----------+--------+-----+--------+----------+--------------+ ATA Distal 56                   monophasic               +-----------+--------+-----+--------+----------+--------------+ PTA Mid    80  biphasic                 +-----------+--------+-----+--------+----------+--------------+ PTA Distal 85                   monophasic               +-----------+--------+-----+--------+----------+--------------+ PERO Distal                               Not visualized +-----------+--------+-----+--------+----------+--------------+  Right Graft #1: Femoral artery - Below knee popliteal artery +------------------+--------+--------+---------+------------+                   PSV cm/sStenosisWaveform Comments     +------------------+--------+--------+---------+------------+ Inflow            65              triphasic             +------------------+--------+--------+---------+------------+ Prox Anastomosis  121             triphasic              +------------------+--------+--------+---------+------------+ Proximal Graft    136             triphasic             +------------------+--------+--------+---------+------------+ Mid Graft         82              triphasic             +------------------+--------+--------+---------+------------+ Distal Graft      111             triphasicValves noted +------------------+--------+--------+---------+------------+ Distal Anastomosis78              triphasic             +------------------+--------+--------+---------+------------+ Outflow           74              triphasic             +------------------+--------+--------+---------+------------+  Left Duplex Findings: +-----------+--------+-----+---------------+-------------------+--------+            PSV cm/sRatioStenosis       Waveform           Comments +-----------+--------+-----+---------------+-------------------+--------+ CFA Distal 221          50-74% stenosismonophasic                  +-----------+--------+-----+---------------+-------------------+--------+ DFA        259          50-74% stenosismonophasic                  +-----------+--------+-----+---------------+-------------------+--------+ SFA Prox   21                                                      +-----------+--------+-----+---------------+-------------------+--------+ SFA Mid                 occluded                                   +-----------+--------+-----+---------------+-------------------+--------+ SFA Distal 11  monophasic                  +-----------+--------+-----+---------------+-------------------+--------+ POP Prox   19                          monophasic                  +-----------+--------+-----+---------------+-------------------+--------+ POP Mid    18                          monophasic                   +-----------+--------+-----+---------------+-------------------+--------+ POP Distal 15                          monophasic                  +-----------+--------+-----+---------------+-------------------+--------+ ATA Distal 15                          monophasic                  +-----------+--------+-----+---------------+-------------------+--------+ PTA Prox   16                          monophasic                  +-----------+--------+-----+---------------+-------------------+--------+ PTA Mid    16                          monophasic                  +-----------+--------+-----+---------------+-------------------+--------+ PTA Distal 21                          monophasic                  +-----------+--------+-----+---------------+-------------------+--------+ PERO Prox  12                          dampened monophasic         +-----------+--------+-----+---------------+-------------------+--------+ PERO Mid   9                           monophasic                  +-----------+--------+-----+---------------+-------------------+--------+ PERO Distal12                          monophasic                  +-----------+--------+-----+---------------+-------------------+--------+  Findings reported to Vivien Rota at Dr. Dellia Nims at 4:11 pm.  Summary: Right Graft(s): Patent right femoral - below knee popliteal BPG without evidence of stenosis. Left: 50-74% stenosis noted in the common femoral artery. 50-74% stenosis noted in the deep femoral artery. Total occlusion noted in the superficial femoral artery.  See table(s) above for measurements and observations. Electronically signed by Curt Jews MD on 01/16/2019 at 8:13:50 AM.    Final    Phillips Climes M.D on 02/10/2019 at 12:35 PM  Between 7am to 7pm - Pager - [phone]  After 7pm go to [email or web address] - password [email or web address]  [facility] -  Office  [phone]

## 2019-02-11 LAB — RENAL FUNCTION PANEL
Albumin: 2.8 g/dL — ABNORMAL LOW (ref 3.5–5.0)
Anion gap: 16 — ABNORMAL HIGH (ref 5–15)
BUN: 41 mg/dL — ABNORMAL HIGH (ref 6–20)
CO2: 24 mmol/L (ref 22–32)
Calcium: 9.7 mg/dL (ref 8.9–10.3)
Chloride: 95 mmol/L — ABNORMAL LOW (ref 98–111)
Creatinine, Ser: 11.58 mg/dL — ABNORMAL HIGH (ref 0.44–1.00)
GFR calc Af Amer: 4 mL/min — ABNORMAL LOW (ref >60)
GFR calc non Af Amer: 4 mL/min — ABNORMAL LOW (ref >60)
Glucose, Bld: 83 mg/dL (ref 70–99)
Phosphorus: 4.3 mg/dL (ref 2.5–4.6)
Potassium: 4 mmol/L (ref 3.5–5.1)
Sodium: 135 mmol/L (ref 135–145)

## 2019-02-11 LAB — CBC
HCT: 32.5 % — ABNORMAL LOW (ref 36.0–46.0)
Hemoglobin: 10.1 g/dL — ABNORMAL LOW (ref 12.0–15.0)
MCH: 29.9 pg (ref 26.0–34.0)
MCHC: 31.1 g/dL (ref 30.0–36.0)
MCV: 96.2 fL (ref 80.0–100.0)
Platelets: 291 10*3/uL (ref 150–400)
RBC: 3.38 MIL/uL — ABNORMAL LOW (ref 3.87–5.11)
RDW: 15.2 % (ref 11.5–15.5)
WBC: 6.2 10*3/uL (ref 4.0–10.5)
nRBC: 0 % (ref 0.0–0.2)

## 2019-02-11 MED ORDER — VANCOMYCIN HCL IN DEXTROSE 1-5 GM/200ML-% IV SOLN
INTRAVENOUS | Status: AC
  Administered 2019-02-11: 1000 mg via INTRAVENOUS
  Filled 2019-02-11: qty 200

## 2019-02-11 MED ORDER — HEPARIN SODIUM (PORCINE) 1000 UNIT/ML IJ SOLN
INTRAMUSCULAR | Status: AC
  Administered 2019-02-11: 3500 [IU] via INTRAVENOUS_CENTRAL
  Filled 2019-02-11: qty 4

## 2019-02-11 MED ORDER — HEPARIN SODIUM (PORCINE) 1000 UNIT/ML DIALYSIS
3500.0000 [IU] | INTRAMUSCULAR | Status: DC | PRN
  Administered 2019-02-11: 3500 [IU] via INTRAVENOUS_CENTRAL
  Filled 2019-02-11: qty 3.5

## 2019-02-11 MED ORDER — HYDROCODONE-ACETAMINOPHEN 5-325 MG PO TABS
2.0000 | ORAL_TABLET | ORAL | Status: DC | PRN
  Administered 2019-02-11 – 2019-02-13 (×7): 2 via ORAL
  Filled 2019-02-11 (×7): qty 2

## 2019-02-11 NOTE — Progress Notes (Addendum)
Subjective:  Seen on HD, no c/o  Objective Vital signs in last 24 hours: Vitals:   02/11/19 1000 02/11/19 1030 02/11/19 1100 02/11/19 1130  BP: (!) 151/109 (!) 159/98 (!) 174/103 137/79  Pulse: 68 84 89 89  Resp:      Temp:      TempSrc:      SpO2:      Weight:      Height:       Weight change:    Physical Exam: General: alert AAF in bed NAD Calm   Heart: RRR , No m,r,g Lungs: CTA unlabored breathing  Abdomen: Obese, bs pos , soft , NT, ND Extremities: L Transmet Amp dressing dry /clean , Left inner groin dressing clean , trace pedal edema L>R   Dialysis Access:l LUA AVF  Pos bruit   Home meds:  - amlodipine 10 / carvedilol 25 bid/ clonidine 0.2 tid  - renvela 2-3 ac tid/ renal vite  - aspirin 81  - oxycodone IR prn/ tramadol 50 qid prn  NW MWF 4h 58min  88.5kg (leaving below 88.4 / 87.6 last 2 txs)  2/ 2.5 bath  Hep 7000  LUA AVF MIrcera 174mcg  q2wks ( last given 02/07) (hgb 8.4  02/05 )    Problem/Plan: 1. 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 ESRD -  HD MWF. HD tomorrow. 3. Hypertension/volume  - BP's have been high, 5kg under dry wt on coreg and norvasc. Home clonidine can be resumed if needed, and prob can lower dry wt a little further if here on Wed.  4. Anemia of ckd: hb 7 > 10 after 2u prbc 2/14. Esa due 2/21 if still here.  5. Metabolic bone disease - Renvela as  binders , no vit d 6. PAD - sp LLE fem-pop BPG in Jan 2020 7. Blind 8. Dispod - looking for SNF   [facility] 02/11/2019, 12:37 PM    Labs: Basic Metabolic Panel: Recent Labs  Lab 02/06/19 0328 02/08/19 0800 02/11/19 0730  NA 138 138 135  K 3.5 4.2 4.0  CL 96* 100 95*  CO2 33* 25 24  GLUCOSE 84 82 83  BUN 23* 21* 41*  CREATININE 9.30* 8.55* 11.58*  CALCIUM 9.5 10.0 9.7  PHOS  --  3.4 4.3   Liver Function Tests: Recent Labs  Lab 02/05/19 1308 02/08/19 0800 02/11/19 0730  AST  13*  --   --   ALT 7  --   --   ALKPHOS 41  --   --   BILITOT 0.6  --   --   PROT 6.3*  --   --   ALBUMIN 2.6* 2.6* 2.8*   No results for input(s): LIPASE, AMYLASE in the last 168 hours. No results for input(s): AMMONIA in the last 168 hours. CBC: Recent Labs  Lab 02/05/19 1308 02/06/19 0328 02/08/19 0800 02/09/19 0413 02/11/19 0730  WBC 8.5 7.3 6.1 8.0 6.2  NEUTROABS 6.6  --   --   --   --   HGB 8.0* 8.4* 7.6* 10.1* 10.1*  HCT 26.1* 27.8* 25.1* 32.0* 32.5*  MCV 101.6* 99.3 101.6* 95.5 96.2  PLT 380 391 318 330 291   Medications: . ceFEPime (MAXIPIME) IV 2 g (02/11/19 1020)  . vancomycin 1,000 mg (02/11/19 1034)   . amLODipine  10 mg Oral Daily  . carvedilol  25 mg Oral BID WC  .  Chlorhexidine Gluconate Cloth  6 each Topical Q0600  . docusate sodium  100 mg Oral BID  . heparin injection (subcutaneous)  5,000 Units Subcutaneous Q8H  . nicotine  21 mg Transdermal Daily  . polyethylene glycol  17 g Oral Daily  . senna-docusate  3 tablet Oral BID  . sevelamer carbonate  3,200 mg Oral TID WC

## 2019-02-11 NOTE — Progress Notes (Signed)
Pharmacy Antibiotic Note  Rebekah Burton is a 43 y.o. female admitted on 02/05/2019 with LLE cellulitis associated with wound dehiscence at site of recent left transmetatarsal amputation.  Pharmacy has been consulted for vancomycin and cefepime dosing.  Patient has ESRD on HD MWF - tolerated today's session.  Afebrile and WBC WNL.  Noted foot culture from 01/15/19 OR visit grew E.coli and Pseudomonas sensitive to cefepime.   Plan: Continue cefepime 2gm IV qHD MWF Vanc 1000mg  IV qHD MWF Monitor HD schedule/tolerance, abx LOT  Consider stopping vanc (MRSA PCR is sensitive to PNA only)   Height: 5\' 4"  (162.6 cm) Weight: 181 lb (82.1 kg) IBW/kg (Calculated) : 54.7  Temp (24hrs), Avg:98.2 F (36.8 C), Min:98 F (36.7 C), Max:98.5 F (36.9 C)  Recent Labs  Lab 02/05/19 1306 02/05/19 1308 02/06/19 0328 02/08/19 0800 02/09/19 0413 02/11/19 0730  WBC  --  8.5 7.3 6.1 8.0 6.2  CREATININE  --  8.07* 9.30* 8.55*  --  11.58*  LATICACIDVEN 0.8  --   --   --   --   --     Estimated Creatinine Clearance: 6.7 mL/min (A) (by C-G formula based on SCr of 11.58 mg/dL (H)).    Allergies  Allergen Reactions  . Percocet [Oxycodone-Acetaminophen] Hives    Vanc 2/12 >> Cefepime 2/12 >>  2/11 BCx - negative   Rebekah Burton, PharmD, BCPS, [place] 02/11/2019, 1:32 PM

## 2019-02-11 NOTE — Care Management Note (Addendum)
Case Management Note  Patient Details  Name: Rebekah Burton MRN: [id number] Date of Birth: [birth year]  Subjective/Objective:                    Action/Plan:  Patient does not feel that her pain is controlled to be discharged. Patient wants to appeal her discharge again. IM given and Detailed Message of discharge given.  DR Bonner Puna aware and will enter a new discharge order. Expected Discharge Date:  02/09/19               Expected Discharge Plan:  [place]  In-House Referral:     Discharge planning Services  CM Consult  Post Acute Care Choice:    Choice offered to:  Patient  DME Arranged:  N/A DME Agency:  NA  HH Arranged:  NA HH Agency:  NA  Status of Service:  In process, will continue to follow  If discussed at Long Length of Stay Meetings, dates discussed:    Additional Comments:  Marilu Favre, RN 02/11/2019, 12:44 PM

## 2019-02-11 NOTE — Social Work (Signed)
CSW spoke with Rebekah Burton at bedside along with RNCM. Rebekah Burton still wants to appeal her discharge. That process was facilitated by [facility]. Rebekah Burton states she doesn't feel her pain is well controlled and that she needs more time here. She also states that she feels that the RN at [facility] did not properly change her dressings. When asked if she wanted to go to another SNF she states she wants to return to [facility] just doesn't feel ready yet.  Will request liaison from [facility] stop by.  Westley Hummer, MSW, [place] Work [phone]

## 2019-02-11 NOTE — Progress Notes (Signed)
Orthopedic Tech Progress Note Patient Details:  Rebekah Burton August 28, 1976 [id number]  Ortho Devices Type of Ortho Device: Darco shoe, ASO Ortho Device/Splint Interventions: Application   Post Interventions Patient Tolerated: Well Instructions Provided: Care of device   [name] 02/11/2019, 1:17 PM

## 2019-02-11 NOTE — Care Management Important Message (Signed)
Important Message  Patient Details  Name: Ofilia Rayon MRN: [id number] Date of Birth: [birth year]   Medicare Important Message Given:  Yes    Marilu Favre, RN 02/11/2019, 12:47 PM

## 2019-02-11 NOTE — Progress Notes (Addendum)
PROGRESS NOTE  Brief Narrative: Rebekah Burton is a 43 y.o. female with a history of ESRD (MWF HD), CAD, HTN, blindness, PAD s/p recent left CFA and deep femoral artery endarterectomy with vein patch angioplasty and left femoral to below-knee popliteal artery vein bypass graft and left foot transmetatarsal amputation (01/17/2019 by Dr. Donnetta Hutching) who was referred to the ED on 2/11 from wound care clinic for concern of cellulitis and wound dehiscence. Vascular surgery was consulted, recommended medical admission for IV antibiotics which were started after blood cultures drawn. Per vascular surgery the wounds appear to be improving, staples removed 2/14, and recommended discharge with follow up in 2 weeks as outpatient. On 2/15 she was discharged but has appealed that discharge. Routine HD continues as well as empiric antibiotics.   Subjective: Left leg pain/foot pain is severe, constant, waxing/waning, not improved with medication as ordered. No sedation noted.  Objective: BP 133/78 (BP Location: Right Arm)   Pulse 88   Temp 98.2 F (36.8 C) (Oral)   Resp 16   Ht 5\' 4"  (1.626 m)   Wt 82.1 kg   SpO2 100%   BMI 31.07 kg/m   Gen: Chronically ill-appearing female in no distress in HD Pulm: Clear and nonlabored on room air  CV: RRR, no murmur, no JVD, no edema GI: Soft, NT, ND, +BS  Neuro: Alert and oriented. Essentially blind, otherwise no focal deficits. Skin: LLE dressing c/d/i, some wounds scabbed over, hemostatic. No purulence or odor.   Assessment & Plan: Principal Problem:   Cellulitis of left lower extremity Active Problems:   ESRD (end stage renal disease) (HCC)   HTN (hypertension)   PAD (peripheral artery disease) (HCC)  LLE cellulitis:  - Continue cefepime and vancomycin (hx Pseudomonas and MRSA infections/colonization) postdialysis with stop date 02/18/2019.  - Continue BID wound dressing changes - Per vascular surgery, Dr. Luther Parody note on 2/14: "Her left transmetatarsal  amputation looks dramatically better than 48 hours ago.  A great deal of the fibrinous exudate as been removed with dressing changes.  Will remove staples due to wound dehiscence.  We will keep sutures in place.  Okay for discharge from vascular surgery standpoint.  I will see her in the office in 2 weeks for continued follow-up" - Augment pain control with hydrocodone 2-3 tabs prn.  ESRD:  - Continue routine HD  PAD: s/p extensive vascular surgery in Jan at recent admission.  - Vascular surgery followed the patient.  - Continue medications as ordered  HTN:  - Continue current medications  Anemia of ESRD: s/p 1u PRBCs 2/14. Hgb has remained stable at 10.1.  - Recheck CBC in 1 week.   Disposition: Patient remains stable for discharge.   Patrecia Pour, MD Pager [phone] 02/11/2019, 4:10 PM

## 2019-02-12 MED ORDER — PRO-STAT SUGAR FREE PO LIQD
30.0000 mL | Freq: Two times a day (BID) | ORAL | Status: DC
  Administered 2019-02-12 – 2019-02-13 (×2): 30 mL via ORAL
  Filled 2019-02-12 (×2): qty 30

## 2019-02-12 NOTE — Social Work (Signed)
Continue to await determination of [place].  Pt still has bed at [facility].  Westley Hummer, MSW, [place] Work [phone]

## 2019-02-12 NOTE — Progress Notes (Addendum)
[facility] Progress Note   Subjective:  Seen in room, no complaints today. No CP/dyspnea.  Objective Vitals:   02/11/19 1154 02/11/19 1422 02/11/19 2053 02/12/19 0400  BP: 120/80 133/78 128/85 133/83  Pulse: 90 88 80 77  Resp: 18 16 16 18   Temp: 98 F (36.7 C) 98.2 F (36.8 C) 99 F (37.2 C) 98.4 F (36.9 C)  TempSrc: Oral Oral Oral Oral  SpO2: 98% 100% 100% 100%  Weight: 82.1 kg     Height:       Physical Exam General: Well appearing, NAD. Blind. Heart: RRR; no murmur Lungs: CTAB Abdomen: soft Extremities: No LE edema, R foot bandaged Dialysis Access: LUE AVF + bruit  Additional Objective Labs: Basic Metabolic Panel: Recent Labs  Lab 02/06/19 0328 02/08/19 0800 02/11/19 0730  NA 138 138 135  K 3.5 4.2 4.0  CL 96* 100 95*  CO2 33* 25 24  GLUCOSE 84 82 83  BUN 23* 21* 41*  CREATININE 9.30* 8.55* 11.58*  CALCIUM 9.5 10.0 9.7  PHOS  --  3.4 4.3   Liver Function Tests: Recent Labs  Lab 02/05/19 1308 02/08/19 0800 02/11/19 0730  AST 13*  --   --   ALT 7  --   --   ALKPHOS 41  --   --   BILITOT 0.6  --   --   PROT 6.3*  --   --   ALBUMIN 2.6* 2.6* 2.8*   CBC: Recent Labs  Lab 02/05/19 1308 02/06/19 0328 02/08/19 0800 02/09/19 0413 02/11/19 0730  WBC 8.5 7.3 6.1 8.0 6.2  NEUTROABS 6.6  --   --   --   --   HGB 8.0* 8.4* 7.6* 10.1* 10.1*  HCT 26.1* 27.8* 25.1* 32.0* 32.5*  MCV 101.6* 99.3 101.6* 95.5 96.2  PLT 380 391 318 330 291   Blood Culture    Component Value Date/Time   SDES BLOOD RIGHT HAND 02/05/2019 2004   SPECREQUEST  02/05/2019 2004    BOTTLES DRAWN AEROBIC ONLY Blood Culture adequate volume   CULT  02/05/2019 2004    NO GROWTH 5 DAYS Performed at [facility], [place] [address] [place], [place] [address]    REPTSTATUS 02/10/2019 FINAL 02/05/2019 2004   Medications: . ceFEPime (MAXIPIME) IV Stopped (02/11/19 1353)  . vancomycin Stopped (02/11/19 1353)   . amLODipine  10 mg Oral Daily  . carvedilol  25  mg Oral BID WC  . Chlorhexidine Gluconate Cloth  6 each Topical Q0600  . docusate sodium  100 mg Oral BID  . heparin injection (subcutaneous)  5,000 Units Subcutaneous Q8H  . nicotine  21 mg Transdermal Daily  . polyethylene glycol  17 g Oral Daily  . senna-docusate  3 tablet Oral BID  . sevelamer carbonate  3,200 mg Oral TID WC    Dialysis Orders: [facility] at MWF 4h 26min88.5kg(leaving below 88.4/87.6 last 2 txs) 2/2.5bath Hep 7000 LUA AVF - Mircera 160mcg q2wks ( last given 02/07) (hgb 8.4 02/05 )  Assessment/Plan: 1. 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 Remains on Vanc/Cefepime. 2. ESRD: Continue HD per MWF schedule, next 2/19. 3. Hypertension/volume: BP controlled, continue current meds and have reduced EDW (now around 82kg). 4. Anemia of CKD: Hgb 10.1, s/p 2U prbc 2/14. Esa due 2/21 if still here.  5. Metabolic bone disease: Corr Ca slightly high, Phos ok. No VDRA, continue renvela. Change to 2Ca bath. 6. PAD - sp LLE fem-pop  BPG in Jan 2020 7. Blind 8. Nutrition: Alb low, adding protein supps. 9. Dispo - looking for SNF  Veneta Penton, PA-C 02/12/2019, 11:09 AM  [facility] Pager: [phone]  Pt seen, examined and agree w A/P as above.  [facility] 02/12/2019, 11:49 AM

## 2019-02-12 NOTE — Progress Notes (Signed)
PROGRESS NOTE  Brief Narrative: Rebekah Burton is a 43 y.o. female with a history of ESRD (MWF HD), CAD, HTN, blindness, PAD s/p recent left CFA and deep femoral artery endarterectomy with vein patch angioplasty and left femoral to below-knee popliteal artery vein bypass graft and left foot transmetatarsal amputation (01/17/2019 by Dr. Donnetta Hutching) who was referred to the ED on 2/11 from wound care clinic for concern of cellulitis and wound dehiscence. Vascular surgery was consulted, recommended medical admission for IV antibiotics which were started after blood cultures drawn. Per vascular surgery the wounds appear to be improving, staples removed 2/14, and recommended discharge with follow up in 2 weeks as outpatient. On 2/15 she was discharged but has appealed that discharge. Routine HD continues as well as empiric antibiotics.   Subjective: Left foot pain is stable, improved with hydrocodone but not to an acceptable level per pt. It is unchanged in character. Has not received tramadol though this is a prn. Again no sedation noted.   Objective: BP 122/82 (BP Location: Right Arm)   Pulse 99   Temp 98.2 F (36.8 C) (Oral)   Resp 16   Ht 5\' 4"  (1.626 m)   Wt 82.1 kg   SpO2 100%   BMI 31.07 kg/m    Gen: Chronically ill-appearing 42 y.o.female in NAD HEENT: MMM, blind. Pulm: Non-labored and clear CV: Regular rate, no murmur appreciated; distal pulses intact/symmetric GI: + BS; soft, non-tender, non-distended Skin: LLE with ACE wrap c/d/i, no edema. Left lower leg with scabs now dislodged and wound bed dry, no discharge. No bleeding at puncture site. RLE with transmetatarsal amputation noted.  Neuro: A&Ox3, blind but otherwise CN II-XII without deficits  Assessment & Plan: Principal Problem:   Cellulitis of left lower extremity Active Problems:   ESRD (end stage renal disease) (HCC)   HTN (hypertension)   PAD (peripheral artery disease) (HCC)  LLE cellulitis:  - Continue cefepime and  vancomycin (hx Pseudomonas and MRSA infections/colonization) postdialysis with stop date 02/18/2019.  - Continue BID wound dressing changes - Per vascular surgery, Dr. Luther Parody note on 2/14: "Her left transmetatarsal amputation looks dramatically better than 48 hours ago.  A great deal of the fibrinous exudate as been removed with dressing changes.  Will remove staples due to wound dehiscence.  We will keep sutures in place.  Okay for discharge from vascular surgery standpoint.  I will see her in the office in 2 weeks for continued follow-up" - Augmented pain control with hydrocodone 2-3 tabs prn. but did not receive 3 tabs. Urged to take tramadol as additional pain management. D/w RN.  ESRD:  - Continue routine HD, next 2/19. Nephrology has lowered EDW during stay.   PAD: s/p extensive vascular surgery in Jan at recent admission.  - Vascular surgery followed the patient.  - Continue medications as ordered  HTN:  - Continue current medications  Anemia of ESRD: s/p 1u PRBCs 2/14. Hgb has remained stable at 10.1.  - Recheck CBC in 1 week.   Disposition: Patient remains stable for discharge. Discussed with CSW. Pt still has SNF bed and decision regarding appeal of discharge remains pending.    Patrecia Pour, MD Pager [phone] 02/12/2019, 3:27 PM

## 2019-02-13 DIAGNOSIS — G8918 Other acute postprocedural pain: Secondary | ICD-10-CM | POA: Diagnosis not present

## 2019-02-13 DIAGNOSIS — M2550 Pain in unspecified joint: Secondary | ICD-10-CM | POA: Diagnosis not present

## 2019-02-13 DIAGNOSIS — L089 Local infection of the skin and subcutaneous tissue, unspecified: Secondary | ICD-10-CM | POA: Diagnosis not present

## 2019-02-13 DIAGNOSIS — L97529 Non-pressure chronic ulcer of other part of left foot with unspecified severity: Secondary | ICD-10-CM | POA: Diagnosis not present

## 2019-02-13 DIAGNOSIS — I739 Peripheral vascular disease, unspecified: Secondary | ICD-10-CM | POA: Diagnosis not present

## 2019-02-13 DIAGNOSIS — R262 Difficulty in walking, not elsewhere classified: Secondary | ICD-10-CM | POA: Diagnosis not present

## 2019-02-13 DIAGNOSIS — S91302D Unspecified open wound, left foot, subsequent encounter: Secondary | ICD-10-CM | POA: Diagnosis not present

## 2019-02-13 DIAGNOSIS — M6281 Muscle weakness (generalized): Secondary | ICD-10-CM | POA: Diagnosis not present

## 2019-02-13 DIAGNOSIS — S98312D Complete traumatic amputation of left midfoot, subsequent encounter: Secondary | ICD-10-CM | POA: Diagnosis not present

## 2019-02-13 DIAGNOSIS — F4320 Adjustment disorder, unspecified: Secondary | ICD-10-CM | POA: Diagnosis not present

## 2019-02-13 DIAGNOSIS — A499 Bacterial infection, unspecified: Secondary | ICD-10-CM | POA: Diagnosis not present

## 2019-02-13 DIAGNOSIS — Z89432 Acquired absence of left foot: Secondary | ICD-10-CM | POA: Diagnosis not present

## 2019-02-13 DIAGNOSIS — N186 End stage renal disease: Secondary | ICD-10-CM | POA: Diagnosis not present

## 2019-02-13 DIAGNOSIS — D631 Anemia in chronic kidney disease: Secondary | ICD-10-CM | POA: Diagnosis not present

## 2019-02-13 DIAGNOSIS — N2581 Secondary hyperparathyroidism of renal origin: Secondary | ICD-10-CM | POA: Diagnosis not present

## 2019-02-13 DIAGNOSIS — Z7401 Bed confinement status: Secondary | ICD-10-CM | POA: Diagnosis not present

## 2019-02-13 DIAGNOSIS — Z992 Dependence on renal dialysis: Secondary | ICD-10-CM | POA: Diagnosis not present

## 2019-02-13 DIAGNOSIS — I10 Essential (primary) hypertension: Secondary | ICD-10-CM | POA: Diagnosis not present

## 2019-02-13 DIAGNOSIS — I70445 Atherosclerosis of autologous vein bypass graft(s) of the left leg with ulceration of other part of foot: Secondary | ICD-10-CM | POA: Diagnosis not present

## 2019-02-13 DIAGNOSIS — L97129 Non-pressure chronic ulcer of left thigh with unspecified severity: Secondary | ICD-10-CM | POA: Diagnosis not present

## 2019-02-13 DIAGNOSIS — T148XXD Other injury of unspecified body region, subsequent encounter: Secondary | ICD-10-CM | POA: Diagnosis not present

## 2019-02-13 DIAGNOSIS — L03116 Cellulitis of left lower limb: Secondary | ICD-10-CM | POA: Diagnosis not present

## 2019-02-13 DIAGNOSIS — D62 Acute posthemorrhagic anemia: Secondary | ICD-10-CM | POA: Diagnosis not present

## 2019-02-13 DIAGNOSIS — I129 Hypertensive chronic kidney disease with stage 1 through stage 4 chronic kidney disease, or unspecified chronic kidney disease: Secondary | ICD-10-CM | POA: Diagnosis not present

## 2019-02-13 DIAGNOSIS — G546 Phantom limb syndrome with pain: Secondary | ICD-10-CM | POA: Diagnosis not present

## 2019-02-13 DIAGNOSIS — H548 Legal blindness, as defined in USA: Secondary | ICD-10-CM | POA: Diagnosis not present

## 2019-02-13 LAB — RENAL FUNCTION PANEL
Albumin: 3 g/dL — ABNORMAL LOW (ref 3.5–5.0)
Anion gap: 16 — ABNORMAL HIGH (ref 5–15)
BUN: 34 mg/dL — ABNORMAL HIGH (ref 6–20)
CO2: 25 mmol/L (ref 22–32)
Calcium: 10.5 mg/dL — ABNORMAL HIGH (ref 8.9–10.3)
Chloride: 94 mmol/L — ABNORMAL LOW (ref 98–111)
Creatinine, Ser: 10.09 mg/dL — ABNORMAL HIGH (ref 0.44–1.00)
GFR calc Af Amer: 5 mL/min — ABNORMAL LOW (ref >60)
GFR calc non Af Amer: 4 mL/min — ABNORMAL LOW (ref >60)
Glucose, Bld: 86 mg/dL (ref 70–99)
Phosphorus: 4.4 mg/dL (ref 2.5–4.6)
Potassium: 3.6 mmol/L (ref 3.5–5.1)
Sodium: 135 mmol/L (ref 135–145)

## 2019-02-13 LAB — CBC
HCT: 33.8 % — ABNORMAL LOW (ref 36.0–46.0)
Hemoglobin: 10.6 g/dL — ABNORMAL LOW (ref 12.0–15.0)
MCH: 30.1 pg (ref 26.0–34.0)
MCHC: 31.4 g/dL (ref 30.0–36.0)
MCV: 96 fL (ref 80.0–100.0)
Platelets: 304 10*3/uL (ref 150–400)
RBC: 3.52 MIL/uL — ABNORMAL LOW (ref 3.87–5.11)
RDW: 15 % (ref 11.5–15.5)
WBC: 6.5 10*3/uL (ref 4.0–10.5)
nRBC: 0 % (ref 0.0–0.2)

## 2019-02-13 MED ORDER — HEPARIN SODIUM (PORCINE) 1000 UNIT/ML DIALYSIS
20.0000 [IU]/kg | INTRAMUSCULAR | Status: DC | PRN
  Administered 2019-02-13: 1600 [IU] via INTRAVENOUS_CENTRAL

## 2019-02-13 MED ORDER — NEPRO/CARBSTEADY PO LIQD
237.0000 mL | Freq: Two times a day (BID) | ORAL | Status: DC
  Administered 2019-02-13: 237 mL via ORAL
  Filled 2019-02-13 (×2): qty 237

## 2019-02-13 MED ORDER — VANCOMYCIN HCL IN DEXTROSE 1-5 GM/200ML-% IV SOLN
INTRAVENOUS | Status: AC
  Filled 2019-02-13: qty 200

## 2019-02-13 MED ORDER — ENSURE ENLIVE PO LIQD: 237.0000 mL | Freq: Two times a day (BID) | ORAL | Status: DC

## 2019-02-13 MED ORDER — NICOTINE 14 MG/24HR TD PT24: 14.0000 mg | MEDICATED_PATCH | Freq: Every day | TRANSDERMAL | 0 refills | Status: DC | PRN

## 2019-02-13 NOTE — Social Work (Signed)
Clinical Social Worker facilitated patient discharge including contacting patient family and facility to confirm patient discharge plans.  Clinical information faxed to facility and family agreeable with plan.  CSW arranged ambulance transport via [facility] to [facility] [place] to call [phone]  with report prior to discharge.  Clinical Social Worker will sign off for now as social work intervention is no longer needed. Please consult [place] again if new need arises.  Westley Hummer, MSW, [place] Social Worker [phone]

## 2019-02-13 NOTE — Discharge Summary (Addendum)
Physician Discharge Summary  Rebekah Burton IWP:[id number] DOB: 07-04-1976 DOA: 02/05/2019  PCP: Patient, [name]  Admit date: 02/05/2019 Discharge date: 02/13/2019  Time spent: 45 minutes  Recommendations for Outpatient Follow-up:  Patient will be discharged to skilled nursing facility.  Patient will need to follow up with primary care provider within one week of discharge.  Follow up with Dr. Donnetta Hutching, vascular surgery in 2 weeks. Continue hemodialysis as scheduled. Patient should continue medications as prescribed.  Patient should follow a renal diet.   Discharge Diagnoses:  Principal Problem:   Cellulitis of left lower extremity Active Problems:   ESRD (end stage renal disease) (HCC)   HTN (hypertension)   PAD (peripheral artery disease) (HCC) Anemia of ESRD Tobacco abuse  Discharge Condition: Stable  Diet recommendation: Renal diet  Filed Weights   02/11/19 1154 02/13/19 0430 02/13/19 0657  Weight: 82.1 kg 81.7 kg 77.2 kg    History of present illness:  On 02/05/2018 by Dr. Babs Bertin Rebekah Burton is a 42 y.o. female with medical history significant for end-stage renal disease on hemodialysis on Monday, Wednesday, Friday, peripheral artery disease status post recent left common femoral artery and deep femoral artery endarterectomy with vein patch angioplasty and a left femoral to below-knee popliteal artery bypass with vein graft, recent left metatarsal amputation, who is admitted to [facility] on 02/05/2019 with suspected left lower extremity cellulitis associated with wound dehiscence at site of recent left transmetatarsal amputation.  Patient was recently discharged on 01/26/2019 from [facility] following hospitalization in which she underwent left common femoral artery and deep femoral artery endarterectomy with vein patch angioplasty and a left femoral to below-knee popliteal artery bypass with vein graft as well as left transmetatarsal amputation  performed by Dr. Sherren Mocha Early on 01/18/19.   Over the last 2 to 3 days, the patient reports noting increased pain, redness, and inflammation at the site of the recent left transmetatarsal amputation.  She has been compliant in routinely attending wound clinic with most recent appointment occurring earlier on 02/05/2019, at which time wound care provider noted dehiscence of left transmetatarsal amputation wound, prompting recommendation for the patient present to the emergency department for further evaluation.  The patient denies any recent subjective fever, chills, or rigors.  She denies rash in any other location.   Emergency department course: Vital signs in the emergency department were notable for the following: Temperature max 99, heart rate 71-79; blood pressure initially noted to be 113/69, which increased to 124/79 following initiation of IV antibiotics, as further described below; respiratory rate 15-19; oxygen saturation 93 to 100% on room air.  Labs performed in the ED were notable for the following: CMP notable for potassium 3.9.  CBC notable for white blood cell count of 8500 with 76% neutrophils.  Blood cultures x2 were collected prior to initiation of antibiotics. Dr. Scot Dock of vascular surgery evaluated the patient in the emergency department, and subsequently recommended admission to the hospital service for initiation of IV antibiotics, with plan for vascular surgery to continue to follow.  While still in the emergency department, the following were administered: Zosyn 3.376 g IV x1 as well as Dilaudid 0.5 mg IV x1.  Subsequently, the patient was admitted to the [place] floor for further evaluation and management of suspected left lower extremity cellulitis associated with wound dehiscence involving recent left transmetatarsal amputation.  Hospital Course:  LLE cellulitis -Was placed on on cefepime/vancomycin during hospitalization.  Continue cefepime only through 02/18/2019 -history  of  Pseudomonas and MRSA infection/colonization -Dressing changes twice daily -Per vascular surgery, Dr. Marrion Coy note on 02/08/2019: "Her left transmetatarsal amputation looks dramatically better than 48 hours ago. A great deal of the fibrinous exudate as been removed with dressing changes. Will remove staples due to wound dehiscence. We will keep sutures in place. Okay for discharge from vascular surgery standpoint. I will see her in the office in 2 weeks for continued follow-up" -Continue pain control  ESRD -nephrology consulted and appreciated  PAD -Status post extensive vascular surgery January 2020 -Ocular surgery consulted and appreciated, will follow up with patient in 2 weeks  Essential hypertension -Stable, continue norvas, coreg   Anemia of ESRD -s/p 1u PRBC on 02/08/2019 -hemoglobin 10.6 -Continue to monitor as an outpatient  Tobacco abuse -continue nicotine patch  Procedures: None  Consultations: Nephrology  Vascular surgery   Discharge Exam: Vitals:   02/13/19 0930 02/13/19 1000  BP: (!) 153/84 (!) 142/80  Pulse: 78 77  Resp:    Temp:    SpO2:       General: Well developed, chronically ill appearing, NAD  Cardiovascular: S1 S2 auscultated, no murmur, RRR  Respiratory: Clear to auscultation bilaterally with equal chest rise  Abdomen: Soft, nontender, nondistended, + bowel sounds  Extremities: warm dry without cyanosis clubbing or edema. RLE transmetatarsal amputation   Neuro: AAOx3, blind, otherwise nonfocal  Skin: LLE with ACE wrap. LLE with scabs.  Discharge Instructions Discharge Instructions    Discharge instructions   Complete by:  As directed    Follow with Primary MD or SNF physician   Activity: As tolerated with Full fall precautions use walker/cane & assistance as needed   Disposition SNF   Diet: Renal modified ,with 1200 cc fluid restriction, with feeding assistance and aspiration precautions.  For Heart failure patients -  Check your Weight same time everyday, if you gain over 2 pounds, or you develop in leg swelling, experience more shortness of breath or chest pain, call your Primary MD immediately. Follow Cardiac Low Salt Diet and 1.5 lit/day fluid restriction.   On your next visit with your primary care physician please Get Medicines reviewed and adjusted.   Please request your Prim.MD to go over all Hospital Tests and Procedure/Radiological results at the follow up, please get all Hospital records sent to your Prim MD by signing hospital release before you go home.   If you experience worsening of your admission symptoms, develop shortness of breath, life threatening emergency, suicidal or homicidal thoughts you must seek medical attention immediately by calling 911 or calling your MD immediately  if symptoms less severe.  You Must read complete instructions/literature along with all the possible adverse reactions/side effects for all the Medicines you take and that have been prescribed to you. Take any new Medicines after you have completely understood and accpet all the possible adverse reactions/side effects.   Do not drive, operating heavy machinery, perform activities at heights, swimming or participation in water activities or provide baby sitting services if your were admitted for syncope or siezures until you have seen by Primary MD or a Neurologist and advised to do so again.  Do not drive when taking Pain medications.    Do not take more than prescribed Pain, Sleep and Anxiety Medications  Special Instructions: If you have smoked or chewed Tobacco  in the last 2 yrs please stop smoking, stop any regular Alcohol  and or any Recreational drug use.  Wear Seat belts while driving.   Please note  You were cared for by a hospitalist during your hospital stay. If you have any questions about your discharge medications or the care you received while you were in the hospital after you are discharged,  you can call the unit and asked to speak with the hospitalist on call if the hospitalist that took care of you is not available. Once you are discharged, your primary care physician will handle any further medical issues. Please note that NO REFILLS for any discharge medications will be authorized once you are discharged, as it is imperative that you return to your primary care physician (or establish a relationship with a primary care physician if you do not have one) for your aftercare needs so that they can reassess your need for medications and monitor your lab values.   Discharge wound care:   Complete by:  As directed    Wet to dry dressing change to open left transmetatarsal amputation wound twice daily.  Cover wound with moist 4 x 4's (normal saline), then wrapped with 4 x 4's, Kerlix, and 4 inch Ace.     Allergies as of 02/13/2019      Reactions   Percocet [oxycodone-acetaminophen] Hives      Medication List    STOP taking these medications   cloNIDine 0.2 MG tablet Commonly known as:  CATAPRES   GOODY HEADACHE PO     TAKE these medications   amLODipine 10 MG tablet Commonly known as:  NORVASC Take 10 mg by mouth daily.   aspirin 81 MG EC tablet Take 1 tablet (81 mg total) by mouth daily.   b complex-vitamin c-folic acid 0.8 MG Tabs tablet Take 1 tablet by mouth every Monday, Wednesday, and Friday with hemodialysis.   carvedilol 25 MG tablet Commonly known as:  COREG Take 25 mg by mouth 2 (two) times daily.   ceFEPIme 2 g in sodium chloride 0.9 % 100 mL Inject 2 g into the vein every Monday, Wednesday, and Friday at 6 PM for 7 days. Continue on her hemodialysis days, Monday, Wednesday, Friday, last dose on  02/18/2019   feeding supplement (ENSURE ENLIVE) Liqd Take 237 mLs by mouth 2 (two) times daily between meals.   lidocaine-prilocaine cream Commonly known as:  EMLA Apply 1 application topically every Monday, Wednesday, and Friday with hemodialysis.   nicotine 14  mg/24hr patch Commonly known as:  NICODERM CQ - dosed in mg/24 hours Place 1 patch (14 mg total) onto the skin daily as needed (smoking cessation).   oxyCODONE 5 MG immediate release tablet Commonly known as:  Oxy IR/ROXICODONE Take 1 tablet (5 mg total) by mouth every 4 (four) hours as needed for moderate pain. What changed:  how much to take   RENVELA 800 MG tablet Generic drug:  sevelamer carbonate Take 1,600-3,200 mg by mouth See admin instructions. Take 3,200 mg by mouth three times a day with meals and 1,600 mg with each snack   senna-docusate 8.6-50 MG tablet Commonly known as:  Senokot-S Take 1 tablet by mouth 2 (two) times daily.   traMADol 50 MG tablet Commonly known as:  ULTRAM Take 1 tablet (50 mg total) by mouth every 6 (six) hours as needed for moderate pain.            Discharge Care Instructions  (From admission, onward)         Start     Ordered   02/09/19 0000  Discharge wound care:    Comments:  Wet to dry dressing change  to open left transmetatarsal amputation wound twice daily.  Cover wound with moist 4 x 4's (normal saline), then wrapped with 4 x 4's, Kerlix, and 4 inch Ace.   02/09/19 1237         Allergies  Allergen Reactions  . Percocet [Oxycodone-Acetaminophen] Hives    Contact information for follow-up providers    Early, Arvilla Meres, MD Follow up in 2 week(s).   Specialties:  Vascular Surgery, Cardiology Contact information: [address] [place] [address] [phone]            Contact information for after-discharge care    Destination    [facility] Preferred SNF .   Service:  Skilled Nursing Contact information: [address] [place] [phone]                   The results of significant diagnostics from this hospitalization (including imaging, microbiology, ancillary and laboratory) are listed below for reference.    Significant Diagnostic Studies: Dg Foot Complete  Left  Result Date: 02/05/2019 CLINICAL DATA:  Recent foot amputation, wound check, pain radiating from foot up to the thigh, end-stage renal disease on dialysis EXAM: LEFT FOOT - COMPLETE 3+ VIEW COMPARISON:  01/16/2019 FINDINGS: Interval transmetatarsal amputation LEFT foot. Bones demineralized. Soft tissue swelling at forefoot with skin clips noted. Slightly greater lucency and less well-defined margins are identified at the stump of the first metatarsal associated cortical definition suspicious for osteomyelitis. No additional areas of bone destruction are identified. Plantar and Achilles insertion calcaneal spur formation. 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 Poorly defined margins and cortical loss at the first metatarsal stump suspicious for osteomyelitis. Electronically Signed   By: Lavonia Dana M.D.   On: 02/05/2019 16:54   Dg Foot Complete Left  Result Date: 01/16/2019 CLINICAL DATA:  Left foot wound with black and 3rd through 5th toes. Wound present for 2 months. End-stage renal disease. EXAM: LEFT FOOT - COMPLETE 3+ VIEW COMPARISON:  Radiographs 01/15/2019. FINDINGS: Periarticular osteopenia again noted at the 2nd through 5th metatarsophalangeal joints without cortical destruction. There is no evidence of acute fracture or dislocation. There is stable spurring along the lateral base of the 1st proximal phalanx. There are no erosive changes. Forefoot soft tissue swelling appears stable without radiopaque foreign body or soft tissue emphysema. Calcaneal spurring and diffuse vascular calcifications are noted. IMPRESSION: Stable radiographs compared with prior study from yesterday. No radiographic evidence of osteomyelitis. Electronically Signed   By: Richardean Sale M.D.   On: 01/16/2019 19:35   Dg Foot Complete Left  Result Date: 01/15/2019 CLINICAL DATA:  Nonhealing wound on dorsal side of left foot. EXAM: LEFT FOOT - COMPLETE 3+  VIEW COMPARISON:  None. FINDINGS: Mild diffuse soft tissue swelling. There is periarticular osteopenia at the second through fifth MTP joints. No acute fractures, dislocations are areas of bone erosion identified. Posterior and plantar calcaneal heel spurs. IMPRESSION: 1. Soft tissue swelling. 2. No acute bone abnormality. Electronically Signed   By: Kerby Moors M.D.   On: 01/15/2019 20:07   Vas [place] Abi With/wo Tbi  Result Date: 01/22/2019 LOWER EXTREMITY DOPPLER STUDY Indications: Peripheral artery disease.  Limitations: patient positioning and pain tolerance Performing Technologist: Abram Sander RVS  Examination Guidelines: A complete evaluation includes at minimum, Doppler waveform signals and systolic blood pressure reading at the level of bilateral brachial, anterior tibial, and posterior tibial arteries, when vessel segments are accessible. Bilateral testing is  considered an integral part of a complete examination. Photoelectric Plethysmograph (PPG) waveforms and toe systolic pressure readings are included as required and additional duplex testing as needed. Limited examinations for reoccurring indications may be performed as noted.  ABI Findings: +--------+------------------+-----+---------+--------+ Right   Rt Pressure (mmHg)IndexWaveform Comment  +--------+------------------+-----+---------+--------+ Brachial130                    triphasic         +--------+------------------+-----+---------+--------+ PTA     255               1.96 biphasic          +--------+------------------+-----+---------+--------+ DP      255               1.96 biphasic          +--------+------------------+-----+---------+--------+ +--------+------------------+-----+-------------------+------------------------+ Left    Lt Pressure (mmHg)IndexWaveform           Comment                  +--------+------------------+-----+-------------------+------------------------+ Brachial                                           unable to obtain due to                                                    fistula                  +--------+------------------+-----+-------------------+------------------------+ PTA     90                0.69 dampened monophasic                         +--------+------------------+-----+-------------------+------------------------+ DP                             dampened monophasicunable to obtain                                                           pressure due to pain                                                       tolerance                +--------+------------------+-----+-------------------+------------------------+ +-------+-----------+-----------+------------+------------+ ABI/TBIToday's ABIToday's TBIPrevious ABIPrevious TBI +-------+-----------+-----------+------------+------------+ Right  1.96                                           +-------+-----------+-----------+------------+------------+ Left   0.69                                           +-------+-----------+-----------+------------+------------+  Summary: Right: Resting right ankle-brachial index indicates noncompressible right lower extremity arteries. Left: Resting left ankle-brachial index indicates moderate left lower extremity arterial disease.  *See table(s) above for measurements and observations.  Electronically signed by Ruta Hinds MD on 01/22/2019 at 11:49:53 AM.   Final    Vas [place] Lower Extremity Arterial Duplex  Result Date: 01/16/2019 LOWER EXTREMITY ARTERIAL DUPLEX STUDY Indications: Peripheral artery disease, and Left non-healing foot ulcer.  Vascular Interventions: Right Fem-BK Pop BPG placed 2018 per patient at outside                         facility. Current ABI:            Unknown Performing Technologist: Caralee Ates BA, RVT, RDMS  Examination Guidelines: A complete evaluation includes B-mode imaging, spectral Doppler, color Doppler,  and power Doppler as needed of all accessible portions of each vessel. Bilateral testing is considered an integral part of a complete examination. Limited examinations for reoccurring indications may be performed as noted.  Right Duplex Findings: +-----------+--------+-----+--------+----------+--------------+            PSV cm/sRatioStenosisWaveform  Comments       +-----------+--------+-----+--------+----------+--------------+ ATA Distal 56                   monophasic               +-----------+--------+-----+--------+----------+--------------+ PTA Mid    80                   biphasic                 +-----------+--------+-----+--------+----------+--------------+ PTA Distal 85                   monophasic               +-----------+--------+-----+--------+----------+--------------+ PERO Distal                               Not visualized +-----------+--------+-----+--------+----------+--------------+  Right Graft #1: Femoral artery - Below knee popliteal artery +------------------+--------+--------+---------+------------+                   PSV cm/sStenosisWaveform Comments     +------------------+--------+--------+---------+------------+ Inflow            65              triphasic             +------------------+--------+--------+---------+------------+ Prox Anastomosis  121             triphasic             +------------------+--------+--------+---------+------------+ Proximal Graft    136             triphasic             +------------------+--------+--------+---------+------------+ Mid Graft         82              triphasic             +------------------+--------+--------+---------+------------+ Distal Graft      111             triphasicValves noted +------------------+--------+--------+---------+------------+ Distal Anastomosis78              triphasic             +------------------+--------+--------+---------+------------+  Outflow  74              triphasic             +------------------+--------+--------+---------+------------+  Left Duplex Findings: +-----------+--------+-----+---------------+-------------------+--------+            PSV cm/sRatioStenosis       Waveform           Comments +-----------+--------+-----+---------------+-------------------+--------+ CFA Distal 221          50-74% stenosismonophasic                  +-----------+--------+-----+---------------+-------------------+--------+ DFA        259          50-74% stenosismonophasic                  +-----------+--------+-----+---------------+-------------------+--------+ SFA Prox   21                                                      +-----------+--------+-----+---------------+-------------------+--------+ SFA Mid                 occluded                                   +-----------+--------+-----+---------------+-------------------+--------+ SFA Distal 11                          monophasic                  +-----------+--------+-----+---------------+-------------------+--------+ POP Prox   19                          monophasic                  +-----------+--------+-----+---------------+-------------------+--------+ POP Mid    18                          monophasic                  +-----------+--------+-----+---------------+-------------------+--------+ POP Distal 15                          monophasic                  +-----------+--------+-----+---------------+-------------------+--------+ ATA Distal 15                          monophasic                  +-----------+--------+-----+---------------+-------------------+--------+ PTA Prox   16                          monophasic                  +-----------+--------+-----+---------------+-------------------+--------+ PTA Mid    16                          monophasic                   +-----------+--------+-----+---------------+-------------------+--------+ PTA Distal 21  monophasic                  +-----------+--------+-----+---------------+-------------------+--------+ PERO Prox  12                          dampened monophasic         +-----------+--------+-----+---------------+-------------------+--------+ PERO Mid   9                           monophasic                  +-----------+--------+-----+---------------+-------------------+--------+ PERO Distal12                          monophasic                  +-----------+--------+-----+---------------+-------------------+--------+  Findings reported to Vivien Rota at Dr. Dellia Nims at 4:11 pm.  Summary: Right Graft(s): Patent right femoral - below knee popliteal BPG without evidence of stenosis. Left: 50-74% stenosis noted in the common femoral artery. 50-74% stenosis noted in the deep femoral artery. Total occlusion noted in the superficial femoral artery.  See table(s) above for measurements and observations. Electronically signed by Curt Jews MD on 01/16/2019 at 8:13:50 AM.    Final     Microbiology: Recent Results (from the past 240 hour(s))  Blood culture (routine x 2)     Status: None   Collection Time: 02/05/19  6:17 PM  Result Value Ref Range Status   Specimen Description SITE NOT SPECIFIED  Final   Special Requests   Final    BOTTLES DRAWN AEROBIC ONLY Blood Culture results may not be optimal due to an inadequate volume of blood received in culture bottles   Culture   Final    NO GROWTH 5 DAYS Performed at [facility], [address] [place], [place] [address]    Report Status 02/10/2019 FINAL  Final  Blood culture (routine x 2)     Status: None   Collection Time: 02/05/19  8:04 PM  Result Value Ref Range Status   Specimen Description BLOOD RIGHT HAND  Final   Special Requests   Final    BOTTLES DRAWN AEROBIC ONLY Blood Culture adequate volume   Culture   Final      NO GROWTH 5 DAYS Performed at [facility], [place] [address], [place], [place] [address]    Report Status 02/10/2019 FINAL  Final     Labs: Basic Metabolic Panel: Recent Labs  Lab 02/08/19 0800 02/11/19 0730 02/13/19 0734  NA 138 135 135  K 4.2 4.0 3.6  CL 100 95* 94*  CO2 25 24 25   GLUCOSE 82 83 86  BUN 21* 41* 34*  CREATININE 8.55* 11.58* 10.09*  CALCIUM 10.0 9.7 10.5*  PHOS 3.4 4.3 4.4   Liver Function Tests: Recent Labs  Lab 02/08/19 0800 02/11/19 0730 02/13/19 0734  ALBUMIN 2.6* 2.8* 3.0*   No results for input(s): LIPASE, AMYLASE in the last 168 hours. No results for input(s): AMMONIA in the last 168 hours. CBC: Recent Labs  Lab 02/08/19 0800 02/09/19 0413 02/11/19 0730 02/13/19 0734  WBC 6.1 8.0 6.2 6.5  HGB 7.6* 10.1* 10.1* 10.6*  HCT 25.1* 32.0* 32.5* 33.8*  MCV 101.6* 95.5 96.2 96.0  PLT 318 330 291 304   Cardiac Enzymes: No results for input(s): CKTOTAL, CKMB, CKMBINDEX, TROPONINI in the last 168 hours.  BNP: BNP (last 3 results) No results for input(s): BNP in the last 8760 hours.  ProBNP (last 3 results) No results for input(s): PROBNP in the last 8760 hours.  CBG: No results for input(s): GLUCAP in the last 168 hours.     Signed:  Cristal Ford  [facility] 02/13/2019, 10:37 AM

## 2019-02-13 NOTE — Clinical Social Work Placement (Signed)
   CLINICAL SOCIAL WORK PLACEMENT  NOTE [facility] RN to call report to [phone]  Date:  02/13/2019  Patient Details  Name: Rebekah Burton MRN: [id number] Date of Birth: 1976-07-22  Clinical Social Work is seeking post-discharge placement for this patient at the [place] level of care (*CSW will initial, date and re-position this form in  chart as items are completed):  Yes   Patient/family provided with [place] Work Department's list of facilities offering this level of care within the geographic area requested by the patient (or if unable, by the patient's family).  Yes   Patient/family informed of their freedom to choose among providers that offer the needed level of care, that participate in Medicare, Medicaid or managed care program needed by the patient, have an available bed and are willing to accept the patient.  Yes   Patient/family informed of [place]'s ownership interest in [facility] and [facility], as well as of the fact that they are under no obligation to receive care at these facilities.  PASRR submitted to EDS on       PASRR number received on       Existing PASRR number confirmed on 02/06/19     FL2 transmitted to all facilities in geographic area requested by pt/family on 02/06/19     FL2 transmitted to all facilities within larger geographic area on       Patient informed that his/her managed care company has contracts with or will negotiate with certain facilities, including the following:        Yes   Patient/family informed of bed offers received.  Patient chooses bed at [facility]     Physician recommends and patient chooses bed at      Patient to be transferred to [facility] on 02/13/19.  Patient to be transferred to facility by PTAR     Patient family notified on 02/13/19 of transfer.  Name of family member notified:  pt states she will inform family, she is A&O      PHYSICIAN       Additional Comment:    _______________________________________________ Alexander Mt, [place] 02/13/2019, 12:56 PM

## 2019-02-13 NOTE — Progress Notes (Signed)
Physical Therapy Cancellation Note   02/13/19 1034  PT Visit Information  Last PT Received On 02/13/19  Reason Eval/Treat Not Completed Patient at procedure or test/unavailable;Other (comment) (Pt in HD. PT will continue to follow acutely. )   Earney Navy, PTA Acute Rehabilitation Services Pager: [phone] Office: [phone]

## 2019-02-13 NOTE — Care Management Note (Signed)
Case Management Note  Patient Details  Name: Rebekah Burton MRN: [id number] Date of Birth: 1976-06-12  Subjective/Objective:                    Action/Plan:  Explained [facility] called and left message 02-12-19 that  patient lost her appeal to discharge. They tried to leave a voicemail for patient buit her voice mail box is full. Explained discharge will be today to SNF. Patient voiced understanding and is agreeable to return [facility]. Expected Discharge Date:  02/13/19               Expected Discharge Plan:  Skilled Nursing Facility  In-House Referral:  Clinical Social Work  Discharge planning Services  CM Consult  Post Acute Care Choice:  NA Choice offered to:  Patient  DME Arranged:  N/A DME Agency:  NA  HH Arranged:  NA HH Agency:  NA  Status of Service:  Completed, signed off  If discussed at [employer] of Stay Meetings, dates discussed:    Additional Comments:  Marilu Favre, RN 02/13/2019, 11:55 AM

## 2019-02-13 NOTE — Progress Notes (Signed)
Patient taken to dialysis by the transporter. Alert and oriented x 4 and able to make needs known. No acute distress noted.

## 2019-02-13 NOTE — Progress Notes (Signed)
Report given to [facility] at 1400 this afternoon, Pt discharged to [facility]. [facility] here at 1940 to Patient.

## 2019-02-13 NOTE — Progress Notes (Signed)
[facility] Progress Note   Subjective:  Seen on HD, no c/o's.    Objective Vitals:   02/13/19 0930 02/13/19 1000 02/13/19 1030 02/13/19 1100  BP: (!) 153/84 (!) 142/80 (!) 148/87 (!) 160/36  Pulse: 78 77 74 81  Resp:      Temp:      TempSrc:      SpO2:      Weight:      Height:       Physical Exam General: Well appearing, NAD. Blind. Heart: RRR; no murmur Lungs: CTAB Abdomen: soft Extremities: No LE edema, R foot bandaged Dialysis Access: LUE AVF + bruit  Additional Objective Labs: Basic Metabolic Panel: Recent Labs  Lab 02/08/19 0800 02/11/19 0730 02/13/19 0734  NA 138 135 135  K 4.2 4.0 3.6  CL 100 95* 94*  CO2 25 24 25   GLUCOSE 82 83 86  BUN 21* 41* 34*  CREATININE 8.55* 11.58* 10.09*  CALCIUM 10.0 9.7 10.5*  PHOS 3.4 4.3 4.4   Liver Function Tests: Recent Labs  Lab 02/08/19 0800 02/11/19 0730 02/13/19 0734  ALBUMIN 2.6* 2.8* 3.0*   CBC: Recent Labs  Lab 02/08/19 0800 02/09/19 0413 02/11/19 0730 02/13/19 0734  WBC 6.1 8.0 6.2 6.5  HGB 7.6* 10.1* 10.1* 10.6*  HCT 25.1* 32.0* 32.5* 33.8*  MCV 101.6* 95.5 96.2 96.0  PLT 318 330 291 304   Blood Culture    Component Value Date/Time   SDES BLOOD RIGHT HAND 02/05/2019 2004   SPECREQUEST  02/05/2019 2004    BOTTLES DRAWN AEROBIC ONLY Blood Culture adequate volume   CULT  02/05/2019 2004    NO GROWTH 5 DAYS Performed at [facility], [place] [address] [place], [place] [address]    REPTSTATUS 02/10/2019 FINAL 02/05/2019 2004   Medications: . ceFEPime (MAXIPIME) IV Stopped (02/11/19 1353)  . vancomycin    . vancomycin 1,000 mg (02/13/19 1040)   . amLODipine  10 mg Oral Daily  . carvedilol  25 mg Oral BID WC  . Chlorhexidine Gluconate Cloth  6 each Topical Q0600  . docusate sodium  100 mg Oral BID  . feeding supplement (ENSURE ENLIVE)  237 mL Oral BID BM  . feeding supplement (NEPRO CARB STEADY)  237 mL Oral BID BM  . feeding supplement (PRO-STAT SUGAR FREE 64)  30 mL  Oral BID  . heparin injection (subcutaneous)  5,000 Units Subcutaneous Q8H  . nicotine  21 mg Transdermal Daily  . polyethylene glycol  17 g Oral Daily  . senna-docusate  3 tablet Oral BID  . sevelamer carbonate  3,200 mg Oral TID WC    Dialysis Orders: [facility] at MWF 4h 23min88.5kg(leaving below 88.4/87.6 last 2 txs) 2/2.5bath Hep 7000 LUA AVF - Mircera 190mcg q2wks ( last given 02/07) (hgb 8.4 02/05 )  Assessment/Plan: 1. 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 Remains on Vanc/Cefepime. 2. ESRD: Continue HD per MWF schedule, next 2/19. 3. Hypertension/volume: BP controlled, continue current meds and have reduced EDW significantly (but today's wt 77kg prob not accurate).  4. Anemia of CKD: Hgb 10.1, s/p 2U prbc 2/14. Esa due 2/21 if still here.  5. Metabolic bone disease: Corr Ca slightly high, Phos ok. No VDRA, continue renvela. Change to 2Ca bath. 6. PAD - sp LLE fem-pop BPG in Jan 2020 7. Blind 8. Nutrition: Alb low, adding protein supps. 9. Dispo - pt is appealing the SNF recommendation   [facility]  02/13/2019, 12:09 PM

## 2019-02-14 DIAGNOSIS — L97129 Non-pressure chronic ulcer of left thigh with unspecified severity: Secondary | ICD-10-CM | POA: Diagnosis not present

## 2019-02-18 DIAGNOSIS — A499 Bacterial infection, unspecified: Secondary | ICD-10-CM | POA: Diagnosis not present

## 2019-02-18 DIAGNOSIS — N2581 Secondary hyperparathyroidism of renal origin: Secondary | ICD-10-CM | POA: Diagnosis not present

## 2019-02-18 DIAGNOSIS — N186 End stage renal disease: Secondary | ICD-10-CM | POA: Diagnosis not present

## 2019-02-18 DIAGNOSIS — D631 Anemia in chronic kidney disease: Secondary | ICD-10-CM | POA: Diagnosis not present

## 2019-02-19 ENCOUNTER — Encounter: Payer: Self-pay | Admitting: Vascular Surgery

## 2019-02-19 ENCOUNTER — Ambulatory Visit (INDEPENDENT_AMBULATORY_CARE_PROVIDER_SITE_OTHER): Payer: Medicare Other | Admitting: Vascular Surgery

## 2019-02-19 VITALS — BP 150/97 | HR 84 | Resp 18

## 2019-02-19 DIAGNOSIS — I739 Peripheral vascular disease, unspecified: Secondary | ICD-10-CM

## 2019-02-19 NOTE — Progress Notes (Signed)
   Patient name: Rebekah Burton MRN: [id number] DOB: [birth year] Sex: female  REASON FOR VISIT: Follow-up left femoral endarterectomy and femoral to popliteal bypass and transmetatarsal amputation on the left all done on 01/18/2019  HPI: Rebekah Burton is a 43 y.o. female patient presented to [facility] with extensive gangrene over the lateral foot.  She underwent endarterectomy of extensively diseased common femoral artery and femoral to popliteal bypass with saphenous vein.  She had an excellent caliber vein.  She underwent transmetatarsal amputation.  She is a end-stage renal disease and was hospitalized for some period of time following the surgery.  She did have separation of her wound but did have a nice granulating base.  She was discharged to nursing facility with local wound care.  She is here today for follow-up  Current Outpatient Medications  Medication Sig Dispense Refill  . amLODipine (NORVASC) 10 MG tablet Take 10 mg by mouth daily.    Marland Kitchen aspirin EC 81 MG EC tablet Take 1 tablet (81 mg total) by mouth daily.    Marland Kitchen lidocaine-prilocaine (EMLA) cream Apply 1 application topically every Monday, Wednesday, and Friday with hemodialysis.     Marland Kitchen oxyCODONE (OXY IR/ROXICODONE) 5 MG immediate release tablet Take 1 tablet (5 mg total) by mouth every 4 (four) hours as needed for moderate pain. 12 tablet 0  . RENVELA 800 MG tablet Take 800 mg by mouth See admin instructions. Take 2 tablets by mouth every 12 hours as needed for snacks    . senna-docusate (SENOKOT-S) 8.6-50 MG tablet Take 1 tablet by mouth 2 (two) times daily.    . traMADol (ULTRAM) 50 MG tablet Take 1 tablet (50 mg total) by mouth every 6 (six) hours as needed for moderate pain. 10 tablet 0  . b complex-vitamin c-folic acid (NEPHRO-VITE) 0.8 MG TABS tablet Take 1 tablet by mouth every Monday, Wednesday, and Friday with hemodialysis.    Marland Kitchen carvedilol (COREG) 25 MG tablet Take 25 mg by mouth 2 (two)  times daily.    . feeding supplement, ENSURE ENLIVE, (ENSURE ENLIVE) LIQD Take 237 mLs by mouth 2 (two) times daily between meals. (Patient not taking: Reported on 02/19/2019)    . nicotine (NICODERM CQ - DOSED IN MG/24 HOURS) 14 mg/24hr patch Place 1 patch (14 mg total) onto the skin daily as needed (smoking cessation). (Patient not taking: Reported on 02/19/2019) 28 patch 0   No current facility-administered medications for this visit.      PHYSICAL EXAM: Vitals:   02/19/19 1317  BP: (!) 150/97  Pulse: 84  Resp: 18  SpO2: 97%    GENERAL: The patient is a well-nourished female, in no acute distress. The vital signs are documented above. Easily palpable popliteal graft pulse.  She does have several areas of Vicryl stitch eruption in the skin with no evidence of infection.  The transmetatarsal amputation site is contracting nicely with good granulating base  MEDICAL ISSUES: Stable overall.  Will continue daily normal saline wet-to-dry dressings to her open trans-met.  I will see her again in 1 month for continued follow-up   Rosetta Posner, MD [facility] Tel [phone] Pager [phone]

## 2019-02-20 DIAGNOSIS — N186 End stage renal disease: Secondary | ICD-10-CM | POA: Diagnosis not present

## 2019-02-20 DIAGNOSIS — D631 Anemia in chronic kidney disease: Secondary | ICD-10-CM | POA: Diagnosis not present

## 2019-02-20 DIAGNOSIS — N2581 Secondary hyperparathyroidism of renal origin: Secondary | ICD-10-CM | POA: Diagnosis not present

## 2019-02-20 DIAGNOSIS — A499 Bacterial infection, unspecified: Secondary | ICD-10-CM | POA: Diagnosis not present

## 2019-02-21 DIAGNOSIS — L97529 Non-pressure chronic ulcer of other part of left foot with unspecified severity: Secondary | ICD-10-CM | POA: Diagnosis not present

## 2019-02-22 DIAGNOSIS — N2581 Secondary hyperparathyroidism of renal origin: Secondary | ICD-10-CM | POA: Diagnosis not present

## 2019-02-22 DIAGNOSIS — A499 Bacterial infection, unspecified: Secondary | ICD-10-CM | POA: Diagnosis not present

## 2019-02-22 DIAGNOSIS — N186 End stage renal disease: Secondary | ICD-10-CM | POA: Diagnosis not present

## 2019-02-22 DIAGNOSIS — D631 Anemia in chronic kidney disease: Secondary | ICD-10-CM | POA: Diagnosis not present

## 2019-02-24 DIAGNOSIS — N186 End stage renal disease: Secondary | ICD-10-CM | POA: Diagnosis not present

## 2019-02-24 DIAGNOSIS — I129 Hypertensive chronic kidney disease with stage 1 through stage 4 chronic kidney disease, or unspecified chronic kidney disease: Secondary | ICD-10-CM | POA: Diagnosis not present

## 2019-02-24 DIAGNOSIS — Z992 Dependence on renal dialysis: Secondary | ICD-10-CM | POA: Diagnosis not present

## 2019-02-25 DIAGNOSIS — A499 Bacterial infection, unspecified: Secondary | ICD-10-CM | POA: Diagnosis not present

## 2019-02-25 DIAGNOSIS — N2581 Secondary hyperparathyroidism of renal origin: Secondary | ICD-10-CM | POA: Diagnosis not present

## 2019-02-25 DIAGNOSIS — N186 End stage renal disease: Secondary | ICD-10-CM | POA: Diagnosis not present

## 2019-02-25 DIAGNOSIS — D631 Anemia in chronic kidney disease: Secondary | ICD-10-CM | POA: Diagnosis not present

## 2019-02-27 DIAGNOSIS — A499 Bacterial infection, unspecified: Secondary | ICD-10-CM | POA: Diagnosis not present

## 2019-02-27 DIAGNOSIS — D631 Anemia in chronic kidney disease: Secondary | ICD-10-CM | POA: Diagnosis not present

## 2019-02-27 DIAGNOSIS — N2581 Secondary hyperparathyroidism of renal origin: Secondary | ICD-10-CM | POA: Diagnosis not present

## 2019-02-27 DIAGNOSIS — N186 End stage renal disease: Secondary | ICD-10-CM | POA: Diagnosis not present

## 2019-02-28 DIAGNOSIS — Z89432 Acquired absence of left foot: Secondary | ICD-10-CM | POA: Diagnosis not present

## 2019-02-28 DIAGNOSIS — L97529 Non-pressure chronic ulcer of other part of left foot with unspecified severity: Secondary | ICD-10-CM | POA: Diagnosis not present

## 2019-02-28 DIAGNOSIS — N186 End stage renal disease: Secondary | ICD-10-CM | POA: Diagnosis not present

## 2019-02-28 DIAGNOSIS — I10 Essential (primary) hypertension: Secondary | ICD-10-CM | POA: Diagnosis not present

## 2019-02-28 DIAGNOSIS — I739 Peripheral vascular disease, unspecified: Secondary | ICD-10-CM | POA: Diagnosis not present

## 2019-03-01 DIAGNOSIS — N186 End stage renal disease: Secondary | ICD-10-CM | POA: Diagnosis not present

## 2019-03-01 DIAGNOSIS — D631 Anemia in chronic kidney disease: Secondary | ICD-10-CM | POA: Diagnosis not present

## 2019-03-01 DIAGNOSIS — A499 Bacterial infection, unspecified: Secondary | ICD-10-CM | POA: Diagnosis not present

## 2019-03-01 DIAGNOSIS — N2581 Secondary hyperparathyroidism of renal origin: Secondary | ICD-10-CM | POA: Diagnosis not present

## 2019-03-04 DIAGNOSIS — N2581 Secondary hyperparathyroidism of renal origin: Secondary | ICD-10-CM | POA: Diagnosis not present

## 2019-03-04 DIAGNOSIS — A499 Bacterial infection, unspecified: Secondary | ICD-10-CM | POA: Diagnosis not present

## 2019-03-04 DIAGNOSIS — N186 End stage renal disease: Secondary | ICD-10-CM | POA: Diagnosis not present

## 2019-03-04 DIAGNOSIS — D631 Anemia in chronic kidney disease: Secondary | ICD-10-CM | POA: Diagnosis not present

## 2019-03-06 DIAGNOSIS — D631 Anemia in chronic kidney disease: Secondary | ICD-10-CM | POA: Diagnosis not present

## 2019-03-06 DIAGNOSIS — A499 Bacterial infection, unspecified: Secondary | ICD-10-CM | POA: Diagnosis not present

## 2019-03-06 DIAGNOSIS — N2581 Secondary hyperparathyroidism of renal origin: Secondary | ICD-10-CM | POA: Diagnosis not present

## 2019-03-06 DIAGNOSIS — N186 End stage renal disease: Secondary | ICD-10-CM | POA: Diagnosis not present

## 2019-03-07 DIAGNOSIS — L97529 Non-pressure chronic ulcer of other part of left foot with unspecified severity: Secondary | ICD-10-CM | POA: Diagnosis not present

## 2019-03-08 DIAGNOSIS — N186 End stage renal disease: Secondary | ICD-10-CM | POA: Diagnosis not present

## 2019-03-08 DIAGNOSIS — A499 Bacterial infection, unspecified: Secondary | ICD-10-CM | POA: Diagnosis not present

## 2019-03-08 DIAGNOSIS — N2581 Secondary hyperparathyroidism of renal origin: Secondary | ICD-10-CM | POA: Diagnosis not present

## 2019-03-08 DIAGNOSIS — D631 Anemia in chronic kidney disease: Secondary | ICD-10-CM | POA: Diagnosis not present

## 2019-03-11 DIAGNOSIS — N186 End stage renal disease: Secondary | ICD-10-CM | POA: Diagnosis not present

## 2019-03-11 DIAGNOSIS — D631 Anemia in chronic kidney disease: Secondary | ICD-10-CM | POA: Diagnosis not present

## 2019-03-11 DIAGNOSIS — N2581 Secondary hyperparathyroidism of renal origin: Secondary | ICD-10-CM | POA: Diagnosis not present

## 2019-03-11 DIAGNOSIS — A499 Bacterial infection, unspecified: Secondary | ICD-10-CM | POA: Diagnosis not present

## 2019-03-13 DIAGNOSIS — N186 End stage renal disease: Secondary | ICD-10-CM | POA: Diagnosis not present

## 2019-03-13 DIAGNOSIS — D631 Anemia in chronic kidney disease: Secondary | ICD-10-CM | POA: Diagnosis not present

## 2019-03-13 DIAGNOSIS — N2581 Secondary hyperparathyroidism of renal origin: Secondary | ICD-10-CM | POA: Diagnosis not present

## 2019-03-13 DIAGNOSIS — A499 Bacterial infection, unspecified: Secondary | ICD-10-CM | POA: Diagnosis not present

## 2019-03-14 DIAGNOSIS — L97529 Non-pressure chronic ulcer of other part of left foot with unspecified severity: Secondary | ICD-10-CM | POA: Diagnosis not present

## 2019-03-15 DIAGNOSIS — A499 Bacterial infection, unspecified: Secondary | ICD-10-CM | POA: Diagnosis not present

## 2019-03-15 DIAGNOSIS — D631 Anemia in chronic kidney disease: Secondary | ICD-10-CM | POA: Diagnosis not present

## 2019-03-15 DIAGNOSIS — N186 End stage renal disease: Secondary | ICD-10-CM | POA: Diagnosis not present

## 2019-03-15 DIAGNOSIS — N2581 Secondary hyperparathyroidism of renal origin: Secondary | ICD-10-CM | POA: Diagnosis not present

## 2019-03-18 DIAGNOSIS — N186 End stage renal disease: Secondary | ICD-10-CM | POA: Diagnosis not present

## 2019-03-18 DIAGNOSIS — D631 Anemia in chronic kidney disease: Secondary | ICD-10-CM | POA: Diagnosis not present

## 2019-03-18 DIAGNOSIS — A499 Bacterial infection, unspecified: Secondary | ICD-10-CM | POA: Diagnosis not present

## 2019-03-18 DIAGNOSIS — N2581 Secondary hyperparathyroidism of renal origin: Secondary | ICD-10-CM | POA: Diagnosis not present

## 2019-03-20 DIAGNOSIS — A499 Bacterial infection, unspecified: Secondary | ICD-10-CM | POA: Diagnosis not present

## 2019-03-20 DIAGNOSIS — N2581 Secondary hyperparathyroidism of renal origin: Secondary | ICD-10-CM | POA: Diagnosis not present

## 2019-03-20 DIAGNOSIS — N186 End stage renal disease: Secondary | ICD-10-CM | POA: Diagnosis not present

## 2019-03-20 DIAGNOSIS — D631 Anemia in chronic kidney disease: Secondary | ICD-10-CM | POA: Diagnosis not present

## 2019-03-21 DIAGNOSIS — L97529 Non-pressure chronic ulcer of other part of left foot with unspecified severity: Secondary | ICD-10-CM | POA: Diagnosis not present

## 2019-03-22 DIAGNOSIS — D631 Anemia in chronic kidney disease: Secondary | ICD-10-CM | POA: Diagnosis not present

## 2019-03-22 DIAGNOSIS — A499 Bacterial infection, unspecified: Secondary | ICD-10-CM | POA: Diagnosis not present

## 2019-03-22 DIAGNOSIS — N186 End stage renal disease: Secondary | ICD-10-CM | POA: Diagnosis not present

## 2019-03-22 DIAGNOSIS — N2581 Secondary hyperparathyroidism of renal origin: Secondary | ICD-10-CM | POA: Diagnosis not present

## 2019-03-25 DIAGNOSIS — D631 Anemia in chronic kidney disease: Secondary | ICD-10-CM | POA: Diagnosis not present

## 2019-03-25 DIAGNOSIS — A499 Bacterial infection, unspecified: Secondary | ICD-10-CM | POA: Diagnosis not present

## 2019-03-25 DIAGNOSIS — N186 End stage renal disease: Secondary | ICD-10-CM | POA: Diagnosis not present

## 2019-03-25 DIAGNOSIS — N2581 Secondary hyperparathyroidism of renal origin: Secondary | ICD-10-CM | POA: Diagnosis not present

## 2019-03-26 ENCOUNTER — Other Ambulatory Visit: Payer: Self-pay

## 2019-03-26 ENCOUNTER — Ambulatory Visit (INDEPENDENT_AMBULATORY_CARE_PROVIDER_SITE_OTHER): Payer: Self-pay | Admitting: Vascular Surgery

## 2019-03-26 ENCOUNTER — Encounter: Payer: Self-pay | Admitting: Vascular Surgery

## 2019-03-26 VITALS — BP 122/86 | HR 91 | Temp 99.0°F | Resp 20 | Ht 64.0 in | Wt 178.6 lb

## 2019-03-26 DIAGNOSIS — I739 Peripheral vascular disease, unspecified: Secondary | ICD-10-CM

## 2019-03-26 NOTE — Progress Notes (Signed)
   Patient name: Rebekah Burton MRN: [id number] DOB: September 25, 1976 Sex: female  REASON FOR VISIT: Follow-up left femoropopliteal and left transmetatarsal amputation  HPI: Rebekah Burton is a 43 y.o. female here today for follow-up.  She underwent left femoral endarterectomy and femoral to popliteal bypass with vein on 01/18/2019.  Had extensive gangrenous changes on her distal foot and also underwent transmetatarsal amputation.  She is here today for continued follow-up.  I saw her 1 month ago when she did have separation of her transmetatarsal amputation.  She is in a skilled nursing facility.  She is blind and end-stage renal disease  Current Outpatient Medications  Medication Sig Dispense Refill  . amLODipine (NORVASC) 10 MG tablet Take 10 mg by mouth daily.    Marland Kitchen aspirin EC 81 MG EC tablet Take 1 tablet (81 mg total) by mouth daily.    Marland Kitchen b complex-vitamin c-folic acid (NEPHRO-VITE) 0.8 MG TABS tablet Take 1 tablet by mouth every Monday, Wednesday, and Friday with hemodialysis.    Marland Kitchen carvedilol (COREG) 25 MG tablet Take 25 mg by mouth 2 (two) times daily.    . feeding supplement, ENSURE ENLIVE, (ENSURE ENLIVE) LIQD Take 237 mLs by mouth 2 (two) times daily between meals.    . lidocaine-prilocaine (EMLA) cream Apply 1 application topically every Monday, Wednesday, and Friday with hemodialysis.     Marland Kitchen nicotine (NICODERM CQ - DOSED IN MG/24 HOURS) 14 mg/24hr patch Place 1 patch (14 mg total) onto the skin daily as needed (smoking cessation). 28 patch 0  . oxyCODONE (OXY IR/ROXICODONE) 5 MG immediate release tablet Take 1 tablet (5 mg total) by mouth every 4 (four) hours as needed for moderate pain. 12 tablet 0  . RENVELA 800 MG tablet Take 800 mg by mouth See admin instructions. Take 2 tablets by mouth every 12 hours as needed for snacks    . senna-docusate (SENOKOT-S) 8.6-50 MG tablet Take 1 tablet by mouth 2 (two) times daily.    . traMADol (ULTRAM) 50 MG tablet Take 1  tablet (50 mg total) by mouth every 6 (six) hours as needed for moderate pain. 10 tablet 0   No current facility-administered medications for this visit.      PHYSICAL EXAM: Vitals:   03/26/19 0830  BP: 122/86  Pulse: 91  Resp: 20  Temp: 99 F (37.2 C)  SpO2: 97%  Weight: 178 lb 9.2 oz (81 kg)  Height: 5\' 4"  (1.626 m)    GENERAL: The patient is a well-nourished female, in no acute distress. The vital signs are documented above. She does have palpable popliteal graft pulse.  She has Healing of her transmetatarsal with a slight central portion still open.  This is been treated with a wound physician with meta honey currently  MEDICAL ISSUES: Doing well overall.  We will see her again in 6 weeks for continued follow-up.   Rosetta Posner, MD FACS [facility] Tel [phone] Pager [phone]

## 2019-03-27 DIAGNOSIS — D631 Anemia in chronic kidney disease: Secondary | ICD-10-CM | POA: Diagnosis not present

## 2019-03-27 DIAGNOSIS — Z992 Dependence on renal dialysis: Secondary | ICD-10-CM | POA: Diagnosis not present

## 2019-03-27 DIAGNOSIS — N186 End stage renal disease: Secondary | ICD-10-CM | POA: Diagnosis not present

## 2019-03-27 DIAGNOSIS — N2581 Secondary hyperparathyroidism of renal origin: Secondary | ICD-10-CM | POA: Diagnosis not present

## 2019-03-27 DIAGNOSIS — I129 Hypertensive chronic kidney disease with stage 1 through stage 4 chronic kidney disease, or unspecified chronic kidney disease: Secondary | ICD-10-CM | POA: Diagnosis not present

## 2019-03-28 DIAGNOSIS — L97529 Non-pressure chronic ulcer of other part of left foot with unspecified severity: Secondary | ICD-10-CM | POA: Diagnosis not present

## 2019-03-29 DIAGNOSIS — D631 Anemia in chronic kidney disease: Secondary | ICD-10-CM | POA: Diagnosis not present

## 2019-03-29 DIAGNOSIS — N186 End stage renal disease: Secondary | ICD-10-CM | POA: Diagnosis not present

## 2019-03-29 DIAGNOSIS — N2581 Secondary hyperparathyroidism of renal origin: Secondary | ICD-10-CM | POA: Diagnosis not present

## 2019-04-01 DIAGNOSIS — D631 Anemia in chronic kidney disease: Secondary | ICD-10-CM | POA: Diagnosis not present

## 2019-04-01 DIAGNOSIS — N186 End stage renal disease: Secondary | ICD-10-CM | POA: Diagnosis not present

## 2019-04-01 DIAGNOSIS — N2581 Secondary hyperparathyroidism of renal origin: Secondary | ICD-10-CM | POA: Diagnosis not present

## 2019-04-03 DIAGNOSIS — D631 Anemia in chronic kidney disease: Secondary | ICD-10-CM | POA: Diagnosis not present

## 2019-04-03 DIAGNOSIS — N2581 Secondary hyperparathyroidism of renal origin: Secondary | ICD-10-CM | POA: Diagnosis not present

## 2019-04-03 DIAGNOSIS — N186 End stage renal disease: Secondary | ICD-10-CM | POA: Diagnosis not present

## 2019-04-04 DIAGNOSIS — L97529 Non-pressure chronic ulcer of other part of left foot with unspecified severity: Secondary | ICD-10-CM | POA: Diagnosis not present

## 2019-04-05 DIAGNOSIS — N186 End stage renal disease: Secondary | ICD-10-CM | POA: Diagnosis not present

## 2019-04-05 DIAGNOSIS — D631 Anemia in chronic kidney disease: Secondary | ICD-10-CM | POA: Diagnosis not present

## 2019-04-05 DIAGNOSIS — N2581 Secondary hyperparathyroidism of renal origin: Secondary | ICD-10-CM | POA: Diagnosis not present

## 2019-04-05 DIAGNOSIS — F4320 Adjustment disorder, unspecified: Secondary | ICD-10-CM | POA: Diagnosis not present

## 2019-04-08 DIAGNOSIS — N186 End stage renal disease: Secondary | ICD-10-CM | POA: Diagnosis not present

## 2019-04-08 DIAGNOSIS — D631 Anemia in chronic kidney disease: Secondary | ICD-10-CM | POA: Diagnosis not present

## 2019-04-08 DIAGNOSIS — N2581 Secondary hyperparathyroidism of renal origin: Secondary | ICD-10-CM | POA: Diagnosis not present

## 2019-04-09 DIAGNOSIS — N186 End stage renal disease: Secondary | ICD-10-CM | POA: Diagnosis not present

## 2019-04-09 DIAGNOSIS — Z992 Dependence on renal dialysis: Secondary | ICD-10-CM | POA: Diagnosis not present

## 2019-04-09 DIAGNOSIS — S98312D Complete traumatic amputation of left midfoot, subsequent encounter: Secondary | ICD-10-CM | POA: Diagnosis not present

## 2019-04-09 DIAGNOSIS — G546 Phantom limb syndrome with pain: Secondary | ICD-10-CM | POA: Diagnosis not present

## 2019-04-10 DIAGNOSIS — N186 End stage renal disease: Secondary | ICD-10-CM | POA: Diagnosis not present

## 2019-04-10 DIAGNOSIS — D631 Anemia in chronic kidney disease: Secondary | ICD-10-CM | POA: Diagnosis not present

## 2019-04-10 DIAGNOSIS — N2581 Secondary hyperparathyroidism of renal origin: Secondary | ICD-10-CM | POA: Diagnosis not present

## 2019-04-11 DIAGNOSIS — I739 Peripheral vascular disease, unspecified: Secondary | ICD-10-CM | POA: Diagnosis not present

## 2019-04-11 DIAGNOSIS — M6281 Muscle weakness (generalized): Secondary | ICD-10-CM | POA: Diagnosis not present

## 2019-04-11 DIAGNOSIS — N186 End stage renal disease: Secondary | ICD-10-CM | POA: Diagnosis not present

## 2019-04-11 DIAGNOSIS — H548 Legal blindness, as defined in USA: Secondary | ICD-10-CM | POA: Diagnosis not present

## 2019-04-11 DIAGNOSIS — I10 Essential (primary) hypertension: Secondary | ICD-10-CM | POA: Diagnosis not present

## 2019-04-11 DIAGNOSIS — Z992 Dependence on renal dialysis: Secondary | ICD-10-CM | POA: Diagnosis not present

## 2019-04-12 DIAGNOSIS — N186 End stage renal disease: Secondary | ICD-10-CM | POA: Diagnosis not present

## 2019-04-12 DIAGNOSIS — D631 Anemia in chronic kidney disease: Secondary | ICD-10-CM | POA: Diagnosis not present

## 2019-04-12 DIAGNOSIS — N2581 Secondary hyperparathyroidism of renal origin: Secondary | ICD-10-CM | POA: Diagnosis not present

## 2019-04-15 DIAGNOSIS — N2581 Secondary hyperparathyroidism of renal origin: Secondary | ICD-10-CM | POA: Diagnosis not present

## 2019-04-15 DIAGNOSIS — D631 Anemia in chronic kidney disease: Secondary | ICD-10-CM | POA: Diagnosis not present

## 2019-04-15 DIAGNOSIS — N186 End stage renal disease: Secondary | ICD-10-CM | POA: Diagnosis not present

## 2019-04-16 DIAGNOSIS — Z89422 Acquired absence of other left toe(s): Secondary | ICD-10-CM | POA: Diagnosis not present

## 2019-04-16 DIAGNOSIS — H548 Legal blindness, as defined in USA: Secondary | ICD-10-CM | POA: Diagnosis not present

## 2019-04-16 DIAGNOSIS — Z992 Dependence on renal dialysis: Secondary | ICD-10-CM | POA: Diagnosis not present

## 2019-04-16 DIAGNOSIS — N186 End stage renal disease: Secondary | ICD-10-CM | POA: Diagnosis not present

## 2019-04-16 DIAGNOSIS — Z4781 Encounter for orthopedic aftercare following surgical amputation: Secondary | ICD-10-CM | POA: Diagnosis not present

## 2019-04-16 DIAGNOSIS — Z743 Need for continuous supervision: Secondary | ICD-10-CM | POA: Diagnosis not present

## 2019-04-16 DIAGNOSIS — Z89412 Acquired absence of left great toe: Secondary | ICD-10-CM | POA: Diagnosis not present

## 2019-04-16 DIAGNOSIS — I739 Peripheral vascular disease, unspecified: Secondary | ICD-10-CM | POA: Diagnosis not present

## 2019-04-16 DIAGNOSIS — M6281 Muscle weakness (generalized): Secondary | ICD-10-CM | POA: Diagnosis not present

## 2019-04-16 DIAGNOSIS — I120 Hypertensive chronic kidney disease with stage 5 chronic kidney disease or end stage renal disease: Secondary | ICD-10-CM | POA: Diagnosis not present

## 2019-04-16 DIAGNOSIS — R2689 Other abnormalities of gait and mobility: Secondary | ICD-10-CM | POA: Diagnosis not present

## 2019-04-17 DIAGNOSIS — D631 Anemia in chronic kidney disease: Secondary | ICD-10-CM | POA: Diagnosis not present

## 2019-04-17 DIAGNOSIS — N2581 Secondary hyperparathyroidism of renal origin: Secondary | ICD-10-CM | POA: Diagnosis not present

## 2019-04-17 DIAGNOSIS — N186 End stage renal disease: Secondary | ICD-10-CM | POA: Diagnosis not present

## 2019-04-18 DIAGNOSIS — Z4781 Encounter for orthopedic aftercare following surgical amputation: Secondary | ICD-10-CM | POA: Diagnosis not present

## 2019-04-18 DIAGNOSIS — Z89412 Acquired absence of left great toe: Secondary | ICD-10-CM | POA: Diagnosis not present

## 2019-04-18 DIAGNOSIS — I739 Peripheral vascular disease, unspecified: Secondary | ICD-10-CM | POA: Diagnosis not present

## 2019-04-18 DIAGNOSIS — Z89422 Acquired absence of other left toe(s): Secondary | ICD-10-CM | POA: Diagnosis not present

## 2019-04-18 DIAGNOSIS — M6281 Muscle weakness (generalized): Secondary | ICD-10-CM | POA: Diagnosis not present

## 2019-04-18 DIAGNOSIS — I120 Hypertensive chronic kidney disease with stage 5 chronic kidney disease or end stage renal disease: Secondary | ICD-10-CM | POA: Diagnosis not present

## 2019-04-19 DIAGNOSIS — D631 Anemia in chronic kidney disease: Secondary | ICD-10-CM | POA: Diagnosis not present

## 2019-04-19 DIAGNOSIS — N2581 Secondary hyperparathyroidism of renal origin: Secondary | ICD-10-CM | POA: Diagnosis not present

## 2019-04-19 DIAGNOSIS — N186 End stage renal disease: Secondary | ICD-10-CM | POA: Diagnosis not present

## 2019-04-22 DIAGNOSIS — N2581 Secondary hyperparathyroidism of renal origin: Secondary | ICD-10-CM | POA: Diagnosis not present

## 2019-04-22 DIAGNOSIS — D631 Anemia in chronic kidney disease: Secondary | ICD-10-CM | POA: Diagnosis not present

## 2019-04-22 DIAGNOSIS — N186 End stage renal disease: Secondary | ICD-10-CM | POA: Diagnosis not present

## 2019-04-23 DIAGNOSIS — I120 Hypertensive chronic kidney disease with stage 5 chronic kidney disease or end stage renal disease: Secondary | ICD-10-CM | POA: Diagnosis not present

## 2019-04-23 DIAGNOSIS — M6281 Muscle weakness (generalized): Secondary | ICD-10-CM | POA: Diagnosis not present

## 2019-04-23 DIAGNOSIS — Z89412 Acquired absence of left great toe: Secondary | ICD-10-CM | POA: Diagnosis not present

## 2019-04-23 DIAGNOSIS — I739 Peripheral vascular disease, unspecified: Secondary | ICD-10-CM | POA: Diagnosis not present

## 2019-04-23 DIAGNOSIS — E8779 Other fluid overload: Secondary | ICD-10-CM | POA: Diagnosis not present

## 2019-04-23 DIAGNOSIS — N2581 Secondary hyperparathyroidism of renal origin: Secondary | ICD-10-CM | POA: Diagnosis not present

## 2019-04-23 DIAGNOSIS — N186 End stage renal disease: Secondary | ICD-10-CM | POA: Diagnosis not present

## 2019-04-23 DIAGNOSIS — Z89422 Acquired absence of other left toe(s): Secondary | ICD-10-CM | POA: Diagnosis not present

## 2019-04-23 DIAGNOSIS — Z4781 Encounter for orthopedic aftercare following surgical amputation: Secondary | ICD-10-CM | POA: Diagnosis not present

## 2019-04-24 DIAGNOSIS — N186 End stage renal disease: Secondary | ICD-10-CM | POA: Diagnosis not present

## 2019-04-24 DIAGNOSIS — N2581 Secondary hyperparathyroidism of renal origin: Secondary | ICD-10-CM | POA: Diagnosis not present

## 2019-04-24 DIAGNOSIS — D631 Anemia in chronic kidney disease: Secondary | ICD-10-CM | POA: Diagnosis not present

## 2019-04-25 DIAGNOSIS — Z89412 Acquired absence of left great toe: Secondary | ICD-10-CM | POA: Diagnosis not present

## 2019-04-25 DIAGNOSIS — M6281 Muscle weakness (generalized): Secondary | ICD-10-CM | POA: Diagnosis not present

## 2019-04-25 DIAGNOSIS — Z89422 Acquired absence of other left toe(s): Secondary | ICD-10-CM | POA: Diagnosis not present

## 2019-04-25 DIAGNOSIS — F4320 Adjustment disorder, unspecified: Secondary | ICD-10-CM | POA: Diagnosis not present

## 2019-04-25 DIAGNOSIS — Z4781 Encounter for orthopedic aftercare following surgical amputation: Secondary | ICD-10-CM | POA: Diagnosis not present

## 2019-04-25 DIAGNOSIS — I120 Hypertensive chronic kidney disease with stage 5 chronic kidney disease or end stage renal disease: Secondary | ICD-10-CM | POA: Diagnosis not present

## 2019-04-25 DIAGNOSIS — I739 Peripheral vascular disease, unspecified: Secondary | ICD-10-CM | POA: Diagnosis not present

## 2019-04-26 DIAGNOSIS — N2581 Secondary hyperparathyroidism of renal origin: Secondary | ICD-10-CM | POA: Diagnosis not present

## 2019-04-26 DIAGNOSIS — I129 Hypertensive chronic kidney disease with stage 1 through stage 4 chronic kidney disease, or unspecified chronic kidney disease: Secondary | ICD-10-CM | POA: Diagnosis not present

## 2019-04-26 DIAGNOSIS — Z992 Dependence on renal dialysis: Secondary | ICD-10-CM | POA: Diagnosis not present

## 2019-04-26 DIAGNOSIS — N186 End stage renal disease: Secondary | ICD-10-CM | POA: Diagnosis not present

## 2019-04-26 DIAGNOSIS — D631 Anemia in chronic kidney disease: Secondary | ICD-10-CM | POA: Diagnosis not present

## 2019-04-29 DIAGNOSIS — N2581 Secondary hyperparathyroidism of renal origin: Secondary | ICD-10-CM | POA: Diagnosis not present

## 2019-04-29 DIAGNOSIS — N186 End stage renal disease: Secondary | ICD-10-CM | POA: Diagnosis not present

## 2019-04-29 DIAGNOSIS — D631 Anemia in chronic kidney disease: Secondary | ICD-10-CM | POA: Diagnosis not present

## 2019-04-30 DIAGNOSIS — I120 Hypertensive chronic kidney disease with stage 5 chronic kidney disease or end stage renal disease: Secondary | ICD-10-CM | POA: Diagnosis not present

## 2019-04-30 DIAGNOSIS — Z89412 Acquired absence of left great toe: Secondary | ICD-10-CM | POA: Diagnosis not present

## 2019-04-30 DIAGNOSIS — Z89422 Acquired absence of other left toe(s): Secondary | ICD-10-CM | POA: Diagnosis not present

## 2019-04-30 DIAGNOSIS — I739 Peripheral vascular disease, unspecified: Secondary | ICD-10-CM | POA: Diagnosis not present

## 2019-04-30 DIAGNOSIS — M6281 Muscle weakness (generalized): Secondary | ICD-10-CM | POA: Diagnosis not present

## 2019-04-30 DIAGNOSIS — Z4781 Encounter for orthopedic aftercare following surgical amputation: Secondary | ICD-10-CM | POA: Diagnosis not present

## 2019-05-01 DIAGNOSIS — N186 End stage renal disease: Secondary | ICD-10-CM | POA: Diagnosis not present

## 2019-05-01 DIAGNOSIS — D631 Anemia in chronic kidney disease: Secondary | ICD-10-CM | POA: Diagnosis not present

## 2019-05-01 DIAGNOSIS — N2581 Secondary hyperparathyroidism of renal origin: Secondary | ICD-10-CM | POA: Diagnosis not present

## 2019-05-02 DIAGNOSIS — Z4781 Encounter for orthopedic aftercare following surgical amputation: Secondary | ICD-10-CM | POA: Diagnosis not present

## 2019-05-02 DIAGNOSIS — Z89412 Acquired absence of left great toe: Secondary | ICD-10-CM | POA: Diagnosis not present

## 2019-05-02 DIAGNOSIS — I739 Peripheral vascular disease, unspecified: Secondary | ICD-10-CM | POA: Diagnosis not present

## 2019-05-02 DIAGNOSIS — Z89422 Acquired absence of other left toe(s): Secondary | ICD-10-CM | POA: Diagnosis not present

## 2019-05-02 DIAGNOSIS — M6281 Muscle weakness (generalized): Secondary | ICD-10-CM | POA: Diagnosis not present

## 2019-05-02 DIAGNOSIS — I120 Hypertensive chronic kidney disease with stage 5 chronic kidney disease or end stage renal disease: Secondary | ICD-10-CM | POA: Diagnosis not present

## 2019-05-03 DIAGNOSIS — N2581 Secondary hyperparathyroidism of renal origin: Secondary | ICD-10-CM | POA: Diagnosis not present

## 2019-05-03 DIAGNOSIS — N186 End stage renal disease: Secondary | ICD-10-CM | POA: Diagnosis not present

## 2019-05-03 DIAGNOSIS — D631 Anemia in chronic kidney disease: Secondary | ICD-10-CM | POA: Diagnosis not present

## 2019-05-06 DIAGNOSIS — D631 Anemia in chronic kidney disease: Secondary | ICD-10-CM | POA: Diagnosis not present

## 2019-05-06 DIAGNOSIS — N2581 Secondary hyperparathyroidism of renal origin: Secondary | ICD-10-CM | POA: Diagnosis not present

## 2019-05-06 DIAGNOSIS — N186 End stage renal disease: Secondary | ICD-10-CM | POA: Diagnosis not present

## 2019-05-07 ENCOUNTER — Ambulatory Visit: Payer: Medicare Other | Admitting: Vascular Surgery

## 2019-05-07 ENCOUNTER — Telehealth: Payer: Self-pay

## 2019-05-07 DIAGNOSIS — I120 Hypertensive chronic kidney disease with stage 5 chronic kidney disease or end stage renal disease: Secondary | ICD-10-CM | POA: Diagnosis not present

## 2019-05-07 DIAGNOSIS — M6281 Muscle weakness (generalized): Secondary | ICD-10-CM | POA: Diagnosis not present

## 2019-05-07 DIAGNOSIS — I739 Peripheral vascular disease, unspecified: Secondary | ICD-10-CM | POA: Diagnosis not present

## 2019-05-07 DIAGNOSIS — Z89422 Acquired absence of other left toe(s): Secondary | ICD-10-CM | POA: Diagnosis not present

## 2019-05-07 DIAGNOSIS — Z4781 Encounter for orthopedic aftercare following surgical amputation: Secondary | ICD-10-CM | POA: Diagnosis not present

## 2019-05-07 DIAGNOSIS — Z89412 Acquired absence of left great toe: Secondary | ICD-10-CM | POA: Diagnosis not present

## 2019-05-07 NOTE — Telephone Encounter (Signed)
Moe with [facility] called and wanted to know how the patient could go about getting a device that would close the gap between the shoe and her amputation.   Called Myna Bright and told her that we use [employer] and that this is something that we could order when she is in the office next week for her follow up.   York Cerise, CMA

## 2019-05-08 DIAGNOSIS — N186 End stage renal disease: Secondary | ICD-10-CM | POA: Diagnosis not present

## 2019-05-08 DIAGNOSIS — N2581 Secondary hyperparathyroidism of renal origin: Secondary | ICD-10-CM | POA: Diagnosis not present

## 2019-05-08 DIAGNOSIS — D631 Anemia in chronic kidney disease: Secondary | ICD-10-CM | POA: Diagnosis not present

## 2019-05-09 DIAGNOSIS — Z89422 Acquired absence of other left toe(s): Secondary | ICD-10-CM | POA: Diagnosis not present

## 2019-05-09 DIAGNOSIS — Z4781 Encounter for orthopedic aftercare following surgical amputation: Secondary | ICD-10-CM | POA: Diagnosis not present

## 2019-05-09 DIAGNOSIS — M6281 Muscle weakness (generalized): Secondary | ICD-10-CM | POA: Diagnosis not present

## 2019-05-09 DIAGNOSIS — Z89412 Acquired absence of left great toe: Secondary | ICD-10-CM | POA: Diagnosis not present

## 2019-05-09 DIAGNOSIS — I739 Peripheral vascular disease, unspecified: Secondary | ICD-10-CM | POA: Diagnosis not present

## 2019-05-09 DIAGNOSIS — I120 Hypertensive chronic kidney disease with stage 5 chronic kidney disease or end stage renal disease: Secondary | ICD-10-CM | POA: Diagnosis not present

## 2019-05-10 DIAGNOSIS — N186 End stage renal disease: Secondary | ICD-10-CM | POA: Diagnosis not present

## 2019-05-10 DIAGNOSIS — D631 Anemia in chronic kidney disease: Secondary | ICD-10-CM | POA: Diagnosis not present

## 2019-05-10 DIAGNOSIS — N2581 Secondary hyperparathyroidism of renal origin: Secondary | ICD-10-CM | POA: Diagnosis not present

## 2019-05-13 DIAGNOSIS — D631 Anemia in chronic kidney disease: Secondary | ICD-10-CM | POA: Diagnosis not present

## 2019-05-13 DIAGNOSIS — N186 End stage renal disease: Secondary | ICD-10-CM | POA: Diagnosis not present

## 2019-05-13 DIAGNOSIS — N2581 Secondary hyperparathyroidism of renal origin: Secondary | ICD-10-CM | POA: Diagnosis not present

## 2019-05-14 ENCOUNTER — Other Ambulatory Visit: Payer: Self-pay

## 2019-05-14 ENCOUNTER — Ambulatory Visit (INDEPENDENT_AMBULATORY_CARE_PROVIDER_SITE_OTHER): Payer: Medicare Other | Admitting: Vascular Surgery

## 2019-05-14 ENCOUNTER — Encounter: Payer: Self-pay | Admitting: Vascular Surgery

## 2019-05-14 VITALS — BP 107/74 | HR 76 | Temp 97.9°F | Resp 20 | Ht 64.0 in | Wt 178.6 lb

## 2019-05-14 DIAGNOSIS — Z89422 Acquired absence of other left toe(s): Secondary | ICD-10-CM | POA: Diagnosis not present

## 2019-05-14 DIAGNOSIS — Z89412 Acquired absence of left great toe: Secondary | ICD-10-CM | POA: Diagnosis not present

## 2019-05-14 DIAGNOSIS — I739 Peripheral vascular disease, unspecified: Secondary | ICD-10-CM

## 2019-05-14 DIAGNOSIS — M6281 Muscle weakness (generalized): Secondary | ICD-10-CM | POA: Diagnosis not present

## 2019-05-14 DIAGNOSIS — I120 Hypertensive chronic kidney disease with stage 5 chronic kidney disease or end stage renal disease: Secondary | ICD-10-CM | POA: Diagnosis not present

## 2019-05-14 DIAGNOSIS — Z4781 Encounter for orthopedic aftercare following surgical amputation: Secondary | ICD-10-CM | POA: Diagnosis not present

## 2019-05-14 NOTE — Progress Notes (Signed)
   Patient name: Rebekah Burton MRN: [id number] DOB: 1976/09/23 Sex: female  REASON FOR VISIT: Follow-up left femoral to popliteal bypass and left transmetatarsal amputation  HPI: Rebekah Burton is a 43 y.o. female here today for follow-up.  She has had near healing of her transmetatarsal amputation.  There is a very slight rim of eschar that is separated but no open wound.  Does report continued to have discomfort over the dorsum of her foot.  Is having pain management by [facility].  Current Outpatient Medications  Medication Sig Dispense Refill  . amLODipine (NORVASC) 10 MG tablet Take 10 mg by mouth daily.    Marland Kitchen aspirin EC 81 MG EC tablet Take 1 tablet (81 mg total) by mouth daily.    Marland Kitchen b complex-vitamin c-folic acid (NEPHRO-VITE) 0.8 MG TABS tablet Take 1 tablet by mouth every Monday, Wednesday, and Friday with hemodialysis.    Marland Kitchen carvedilol (COREG) 25 MG tablet Take 25 mg by mouth 2 (two) times daily.    . feeding supplement, ENSURE ENLIVE, (ENSURE ENLIVE) LIQD Take 237 mLs by mouth 2 (two) times daily between meals.    . gabapentin (NEURONTIN) 100 MG capsule TK 1 C PO Q 12 HOURS FOR PAIN    . lidocaine-prilocaine (EMLA) cream Apply 1 application topically every Monday, Wednesday, and Friday with hemodialysis.     Marland Kitchen nicotine (NICODERM CQ - DOSED IN MG/24 HOURS) 14 mg/24hr patch Place 1 patch (14 mg total) onto the skin daily as needed (smoking cessation). 28 patch 0  . RENVELA 800 MG tablet Take 800 mg by mouth See admin instructions. Take 2 tablets by mouth every 12 hours as needed for snacks    . senna-docusate (SENOKOT-S) 8.6-50 MG tablet Take 1 tablet by mouth 2 (two) times daily.    Marland Kitchen oxyCODONE (OXY IR/ROXICODONE) 5 MG immediate release tablet Take 1 tablet (5 mg total) by mouth every 4 (four) hours as needed for moderate pain. (Patient not taking: Reported on 05/14/2019) 12 tablet 0  . traMADol (ULTRAM) 50 MG tablet Take 1 tablet (50 mg  total) by mouth every 6 (six) hours as needed for moderate pain. (Patient not taking: Reported on 05/14/2019) 10 tablet 0   No current facility-administered medications for this visit.      PHYSICAL EXAM: Vitals:   05/14/19 1018  BP: 107/74  Pulse: 76  Resp: 20  Temp: 97.9 F (36.6 C)  SpO2: 100%  Weight: 178 lb 9.2 oz (81 kg)  Height: 5\' 4"  (1.626 m)    GENERAL: The patient is a well-nourished female, in no acute distress. The vital signs are documented above. She does have a 2+ left popliteal pulse.  Transmetatarsal amputation is completely healed.  There is one nylon suture which was removed  MEDICAL ISSUES: Doing well overall.  She is walking with bilateral transmetatarsal amputation without difficulty.  The patient is blind which is her main limitation.  We will see her again in 6 months with ankle arm indices and duplex follow-up of her bypass   Rosetta Posner, MD [facility] Tel [phone] Pager [phone]

## 2019-05-15 DIAGNOSIS — D631 Anemia in chronic kidney disease: Secondary | ICD-10-CM | POA: Diagnosis not present

## 2019-05-15 DIAGNOSIS — N186 End stage renal disease: Secondary | ICD-10-CM | POA: Diagnosis not present

## 2019-05-15 DIAGNOSIS — N2581 Secondary hyperparathyroidism of renal origin: Secondary | ICD-10-CM | POA: Diagnosis not present

## 2019-05-16 DIAGNOSIS — N2581 Secondary hyperparathyroidism of renal origin: Secondary | ICD-10-CM | POA: Diagnosis not present

## 2019-05-16 DIAGNOSIS — N186 End stage renal disease: Secondary | ICD-10-CM | POA: Diagnosis not present

## 2019-05-16 DIAGNOSIS — Z743 Need for continuous supervision: Secondary | ICD-10-CM | POA: Diagnosis not present

## 2019-05-16 DIAGNOSIS — M6281 Muscle weakness (generalized): Secondary | ICD-10-CM | POA: Diagnosis not present

## 2019-05-16 DIAGNOSIS — R2689 Other abnormalities of gait and mobility: Secondary | ICD-10-CM | POA: Diagnosis not present

## 2019-05-16 DIAGNOSIS — I739 Peripheral vascular disease, unspecified: Secondary | ICD-10-CM | POA: Diagnosis not present

## 2019-05-16 DIAGNOSIS — E8770 Fluid overload, unspecified: Secondary | ICD-10-CM | POA: Diagnosis not present

## 2019-05-16 DIAGNOSIS — H548 Legal blindness, as defined in USA: Secondary | ICD-10-CM | POA: Diagnosis not present

## 2019-05-16 DIAGNOSIS — Z992 Dependence on renal dialysis: Secondary | ICD-10-CM | POA: Diagnosis not present

## 2019-05-16 DIAGNOSIS — I120 Hypertensive chronic kidney disease with stage 5 chronic kidney disease or end stage renal disease: Secondary | ICD-10-CM | POA: Diagnosis not present

## 2019-05-16 DIAGNOSIS — Z4781 Encounter for orthopedic aftercare following surgical amputation: Secondary | ICD-10-CM | POA: Diagnosis not present

## 2019-05-16 DIAGNOSIS — Z89412 Acquired absence of left great toe: Secondary | ICD-10-CM | POA: Diagnosis not present

## 2019-05-16 DIAGNOSIS — Z89422 Acquired absence of other left toe(s): Secondary | ICD-10-CM | POA: Diagnosis not present

## 2019-05-17 DIAGNOSIS — N186 End stage renal disease: Secondary | ICD-10-CM | POA: Diagnosis not present

## 2019-05-17 DIAGNOSIS — D631 Anemia in chronic kidney disease: Secondary | ICD-10-CM | POA: Diagnosis not present

## 2019-05-17 DIAGNOSIS — N2581 Secondary hyperparathyroidism of renal origin: Secondary | ICD-10-CM | POA: Diagnosis not present

## 2019-05-20 DIAGNOSIS — N2581 Secondary hyperparathyroidism of renal origin: Secondary | ICD-10-CM | POA: Diagnosis not present

## 2019-05-20 DIAGNOSIS — N186 End stage renal disease: Secondary | ICD-10-CM | POA: Diagnosis not present

## 2019-05-20 DIAGNOSIS — D631 Anemia in chronic kidney disease: Secondary | ICD-10-CM | POA: Diagnosis not present

## 2019-05-21 DIAGNOSIS — Z89422 Acquired absence of other left toe(s): Secondary | ICD-10-CM | POA: Diagnosis not present

## 2019-05-21 DIAGNOSIS — I120 Hypertensive chronic kidney disease with stage 5 chronic kidney disease or end stage renal disease: Secondary | ICD-10-CM | POA: Diagnosis not present

## 2019-05-21 DIAGNOSIS — Z4781 Encounter for orthopedic aftercare following surgical amputation: Secondary | ICD-10-CM | POA: Diagnosis not present

## 2019-05-21 DIAGNOSIS — Z89412 Acquired absence of left great toe: Secondary | ICD-10-CM | POA: Diagnosis not present

## 2019-05-21 DIAGNOSIS — M6281 Muscle weakness (generalized): Secondary | ICD-10-CM | POA: Diagnosis not present

## 2019-05-21 DIAGNOSIS — I739 Peripheral vascular disease, unspecified: Secondary | ICD-10-CM | POA: Diagnosis not present

## 2019-05-22 DIAGNOSIS — N186 End stage renal disease: Secondary | ICD-10-CM | POA: Diagnosis not present

## 2019-05-22 DIAGNOSIS — D631 Anemia in chronic kidney disease: Secondary | ICD-10-CM | POA: Diagnosis not present

## 2019-05-22 DIAGNOSIS — N2581 Secondary hyperparathyroidism of renal origin: Secondary | ICD-10-CM | POA: Diagnosis not present

## 2019-05-24 DIAGNOSIS — Z87891 Personal history of nicotine dependence: Secondary | ICD-10-CM | POA: Diagnosis not present

## 2019-05-24 DIAGNOSIS — D631 Anemia in chronic kidney disease: Secondary | ICD-10-CM | POA: Diagnosis not present

## 2019-05-24 DIAGNOSIS — R52 Pain, unspecified: Secondary | ICD-10-CM | POA: Diagnosis not present

## 2019-05-24 DIAGNOSIS — N186 End stage renal disease: Secondary | ICD-10-CM | POA: Diagnosis not present

## 2019-05-24 DIAGNOSIS — I739 Peripheral vascular disease, unspecified: Secondary | ICD-10-CM | POA: Diagnosis not present

## 2019-05-24 DIAGNOSIS — N2581 Secondary hyperparathyroidism of renal origin: Secondary | ICD-10-CM | POA: Diagnosis not present

## 2019-05-24 DIAGNOSIS — L309 Dermatitis, unspecified: Secondary | ICD-10-CM | POA: Diagnosis not present

## 2019-05-24 DIAGNOSIS — I10 Essential (primary) hypertension: Secondary | ICD-10-CM | POA: Diagnosis not present

## 2019-05-24 DIAGNOSIS — L03116 Cellulitis of left lower limb: Secondary | ICD-10-CM | POA: Diagnosis not present

## 2019-05-24 DIAGNOSIS — D638 Anemia in other chronic diseases classified elsewhere: Secondary | ICD-10-CM | POA: Diagnosis not present

## 2019-05-24 DIAGNOSIS — E669 Obesity, unspecified: Secondary | ICD-10-CM | POA: Diagnosis not present

## 2019-05-27 DIAGNOSIS — N2581 Secondary hyperparathyroidism of renal origin: Secondary | ICD-10-CM | POA: Diagnosis not present

## 2019-05-27 DIAGNOSIS — N186 End stage renal disease: Secondary | ICD-10-CM | POA: Diagnosis not present

## 2019-05-27 DIAGNOSIS — I129 Hypertensive chronic kidney disease with stage 1 through stage 4 chronic kidney disease, or unspecified chronic kidney disease: Secondary | ICD-10-CM | POA: Diagnosis not present

## 2019-05-27 DIAGNOSIS — Z992 Dependence on renal dialysis: Secondary | ICD-10-CM | POA: Diagnosis not present

## 2019-05-29 DIAGNOSIS — N186 End stage renal disease: Secondary | ICD-10-CM | POA: Diagnosis not present

## 2019-05-29 DIAGNOSIS — N2581 Secondary hyperparathyroidism of renal origin: Secondary | ICD-10-CM | POA: Diagnosis not present

## 2019-05-31 DIAGNOSIS — N2581 Secondary hyperparathyroidism of renal origin: Secondary | ICD-10-CM | POA: Diagnosis not present

## 2019-05-31 DIAGNOSIS — N186 End stage renal disease: Secondary | ICD-10-CM | POA: Diagnosis not present

## 2019-06-03 DIAGNOSIS — N2581 Secondary hyperparathyroidism of renal origin: Secondary | ICD-10-CM | POA: Diagnosis not present

## 2019-06-03 DIAGNOSIS — N186 End stage renal disease: Secondary | ICD-10-CM | POA: Diagnosis not present

## 2019-06-04 DIAGNOSIS — N186 End stage renal disease: Secondary | ICD-10-CM | POA: Diagnosis not present

## 2019-06-04 DIAGNOSIS — Z992 Dependence on renal dialysis: Secondary | ICD-10-CM | POA: Diagnosis not present

## 2019-06-04 DIAGNOSIS — I871 Compression of vein: Secondary | ICD-10-CM | POA: Diagnosis not present

## 2019-06-05 DIAGNOSIS — N186 End stage renal disease: Secondary | ICD-10-CM | POA: Diagnosis not present

## 2019-06-05 DIAGNOSIS — N2581 Secondary hyperparathyroidism of renal origin: Secondary | ICD-10-CM | POA: Diagnosis not present

## 2019-06-07 DIAGNOSIS — N186 End stage renal disease: Secondary | ICD-10-CM | POA: Diagnosis not present

## 2019-06-07 DIAGNOSIS — N2581 Secondary hyperparathyroidism of renal origin: Secondary | ICD-10-CM | POA: Diagnosis not present

## 2019-06-10 DIAGNOSIS — N186 End stage renal disease: Secondary | ICD-10-CM | POA: Diagnosis not present

## 2019-06-10 DIAGNOSIS — N2581 Secondary hyperparathyroidism of renal origin: Secondary | ICD-10-CM | POA: Diagnosis not present

## 2019-06-12 DIAGNOSIS — N186 End stage renal disease: Secondary | ICD-10-CM | POA: Diagnosis not present

## 2019-06-12 DIAGNOSIS — N2581 Secondary hyperparathyroidism of renal origin: Secondary | ICD-10-CM | POA: Diagnosis not present

## 2019-06-14 DIAGNOSIS — N2581 Secondary hyperparathyroidism of renal origin: Secondary | ICD-10-CM | POA: Diagnosis not present

## 2019-06-14 DIAGNOSIS — N186 End stage renal disease: Secondary | ICD-10-CM | POA: Diagnosis not present

## 2019-06-17 DIAGNOSIS — N2581 Secondary hyperparathyroidism of renal origin: Secondary | ICD-10-CM | POA: Diagnosis not present

## 2019-06-17 DIAGNOSIS — N186 End stage renal disease: Secondary | ICD-10-CM | POA: Diagnosis not present

## 2019-06-19 DIAGNOSIS — N186 End stage renal disease: Secondary | ICD-10-CM | POA: Diagnosis not present

## 2019-06-19 DIAGNOSIS — N2581 Secondary hyperparathyroidism of renal origin: Secondary | ICD-10-CM | POA: Diagnosis not present

## 2019-06-21 DIAGNOSIS — N2581 Secondary hyperparathyroidism of renal origin: Secondary | ICD-10-CM | POA: Diagnosis not present

## 2019-06-21 DIAGNOSIS — N186 End stage renal disease: Secondary | ICD-10-CM | POA: Diagnosis not present

## 2019-06-24 DIAGNOSIS — N2581 Secondary hyperparathyroidism of renal origin: Secondary | ICD-10-CM | POA: Diagnosis not present

## 2019-06-24 DIAGNOSIS — N186 End stage renal disease: Secondary | ICD-10-CM | POA: Diagnosis not present

## 2019-06-26 DIAGNOSIS — I129 Hypertensive chronic kidney disease with stage 1 through stage 4 chronic kidney disease, or unspecified chronic kidney disease: Secondary | ICD-10-CM | POA: Diagnosis not present

## 2019-06-26 DIAGNOSIS — E8770 Fluid overload, unspecified: Secondary | ICD-10-CM | POA: Diagnosis not present

## 2019-06-26 DIAGNOSIS — N186 End stage renal disease: Secondary | ICD-10-CM | POA: Diagnosis not present

## 2019-06-26 DIAGNOSIS — N2581 Secondary hyperparathyroidism of renal origin: Secondary | ICD-10-CM | POA: Diagnosis not present

## 2019-06-26 DIAGNOSIS — Z992 Dependence on renal dialysis: Secondary | ICD-10-CM | POA: Diagnosis not present

## 2019-06-27 DIAGNOSIS — N2581 Secondary hyperparathyroidism of renal origin: Secondary | ICD-10-CM | POA: Diagnosis not present

## 2019-06-27 DIAGNOSIS — N186 End stage renal disease: Secondary | ICD-10-CM | POA: Diagnosis not present

## 2019-06-27 DIAGNOSIS — E8770 Fluid overload, unspecified: Secondary | ICD-10-CM | POA: Diagnosis not present

## 2019-06-28 DIAGNOSIS — N2581 Secondary hyperparathyroidism of renal origin: Secondary | ICD-10-CM | POA: Diagnosis not present

## 2019-06-28 DIAGNOSIS — N186 End stage renal disease: Secondary | ICD-10-CM | POA: Diagnosis not present

## 2019-07-01 DIAGNOSIS — N186 End stage renal disease: Secondary | ICD-10-CM | POA: Diagnosis not present

## 2019-07-01 DIAGNOSIS — N2581 Secondary hyperparathyroidism of renal origin: Secondary | ICD-10-CM | POA: Diagnosis not present

## 2019-07-03 DIAGNOSIS — N186 End stage renal disease: Secondary | ICD-10-CM | POA: Diagnosis not present

## 2019-07-03 DIAGNOSIS — N2581 Secondary hyperparathyroidism of renal origin: Secondary | ICD-10-CM | POA: Diagnosis not present

## 2019-07-05 DIAGNOSIS — N186 End stage renal disease: Secondary | ICD-10-CM | POA: Diagnosis not present

## 2019-07-05 DIAGNOSIS — N2581 Secondary hyperparathyroidism of renal origin: Secondary | ICD-10-CM | POA: Diagnosis not present

## 2019-07-08 DIAGNOSIS — N186 End stage renal disease: Secondary | ICD-10-CM | POA: Diagnosis not present

## 2019-07-08 DIAGNOSIS — N2581 Secondary hyperparathyroidism of renal origin: Secondary | ICD-10-CM | POA: Diagnosis not present

## 2019-07-10 DIAGNOSIS — N2581 Secondary hyperparathyroidism of renal origin: Secondary | ICD-10-CM | POA: Diagnosis not present

## 2019-07-10 DIAGNOSIS — N186 End stage renal disease: Secondary | ICD-10-CM | POA: Diagnosis not present

## 2019-07-12 DIAGNOSIS — N2581 Secondary hyperparathyroidism of renal origin: Secondary | ICD-10-CM | POA: Diagnosis not present

## 2019-07-12 DIAGNOSIS — N186 End stage renal disease: Secondary | ICD-10-CM | POA: Diagnosis not present

## 2019-07-15 DIAGNOSIS — N186 End stage renal disease: Secondary | ICD-10-CM | POA: Diagnosis not present

## 2019-07-15 DIAGNOSIS — N2581 Secondary hyperparathyroidism of renal origin: Secondary | ICD-10-CM | POA: Diagnosis not present

## 2019-07-17 DIAGNOSIS — N186 End stage renal disease: Secondary | ICD-10-CM | POA: Diagnosis not present

## 2019-07-17 DIAGNOSIS — N2581 Secondary hyperparathyroidism of renal origin: Secondary | ICD-10-CM | POA: Diagnosis not present

## 2019-07-19 DIAGNOSIS — N2581 Secondary hyperparathyroidism of renal origin: Secondary | ICD-10-CM | POA: Diagnosis not present

## 2019-07-19 DIAGNOSIS — N186 End stage renal disease: Secondary | ICD-10-CM | POA: Diagnosis not present

## 2019-07-22 DIAGNOSIS — N2581 Secondary hyperparathyroidism of renal origin: Secondary | ICD-10-CM | POA: Diagnosis not present

## 2019-07-22 DIAGNOSIS — N186 End stage renal disease: Secondary | ICD-10-CM | POA: Diagnosis not present

## 2019-07-24 DIAGNOSIS — N2581 Secondary hyperparathyroidism of renal origin: Secondary | ICD-10-CM | POA: Diagnosis not present

## 2019-07-24 DIAGNOSIS — N186 End stage renal disease: Secondary | ICD-10-CM | POA: Diagnosis not present

## 2019-07-26 DIAGNOSIS — N2581 Secondary hyperparathyroidism of renal origin: Secondary | ICD-10-CM | POA: Diagnosis not present

## 2019-07-26 DIAGNOSIS — N186 End stage renal disease: Secondary | ICD-10-CM | POA: Diagnosis not present

## 2019-07-27 DIAGNOSIS — I129 Hypertensive chronic kidney disease with stage 1 through stage 4 chronic kidney disease, or unspecified chronic kidney disease: Secondary | ICD-10-CM | POA: Diagnosis not present

## 2019-07-27 DIAGNOSIS — Z992 Dependence on renal dialysis: Secondary | ICD-10-CM | POA: Diagnosis not present

## 2019-07-27 DIAGNOSIS — N186 End stage renal disease: Secondary | ICD-10-CM | POA: Diagnosis not present

## 2019-07-29 DIAGNOSIS — N186 End stage renal disease: Secondary | ICD-10-CM | POA: Diagnosis not present

## 2019-07-29 DIAGNOSIS — D631 Anemia in chronic kidney disease: Secondary | ICD-10-CM | POA: Diagnosis not present

## 2019-07-29 DIAGNOSIS — N2581 Secondary hyperparathyroidism of renal origin: Secondary | ICD-10-CM | POA: Diagnosis not present

## 2019-07-29 DIAGNOSIS — Z992 Dependence on renal dialysis: Secondary | ICD-10-CM | POA: Diagnosis not present

## 2019-07-31 DIAGNOSIS — E669 Obesity, unspecified: Secondary | ICD-10-CM | POA: Diagnosis not present

## 2019-07-31 DIAGNOSIS — I10 Essential (primary) hypertension: Secondary | ICD-10-CM | POA: Diagnosis not present

## 2019-07-31 DIAGNOSIS — Z87891 Personal history of nicotine dependence: Secondary | ICD-10-CM | POA: Diagnosis not present

## 2019-07-31 DIAGNOSIS — Z992 Dependence on renal dialysis: Secondary | ICD-10-CM | POA: Diagnosis not present

## 2019-07-31 DIAGNOSIS — D638 Anemia in other chronic diseases classified elsewhere: Secondary | ICD-10-CM | POA: Diagnosis not present

## 2019-07-31 DIAGNOSIS — N2581 Secondary hyperparathyroidism of renal origin: Secondary | ICD-10-CM | POA: Diagnosis not present

## 2019-07-31 DIAGNOSIS — L03116 Cellulitis of left lower limb: Secondary | ICD-10-CM | POA: Diagnosis not present

## 2019-07-31 DIAGNOSIS — G894 Chronic pain syndrome: Secondary | ICD-10-CM | POA: Diagnosis not present

## 2019-07-31 DIAGNOSIS — I739 Peripheral vascular disease, unspecified: Secondary | ICD-10-CM | POA: Diagnosis not present

## 2019-07-31 DIAGNOSIS — D631 Anemia in chronic kidney disease: Secondary | ICD-10-CM | POA: Diagnosis not present

## 2019-07-31 DIAGNOSIS — N186 End stage renal disease: Secondary | ICD-10-CM | POA: Diagnosis not present

## 2019-08-02 DIAGNOSIS — D631 Anemia in chronic kidney disease: Secondary | ICD-10-CM | POA: Diagnosis not present

## 2019-08-02 DIAGNOSIS — Z992 Dependence on renal dialysis: Secondary | ICD-10-CM | POA: Diagnosis not present

## 2019-08-02 DIAGNOSIS — N2581 Secondary hyperparathyroidism of renal origin: Secondary | ICD-10-CM | POA: Diagnosis not present

## 2019-08-02 DIAGNOSIS — N186 End stage renal disease: Secondary | ICD-10-CM | POA: Diagnosis not present

## 2019-08-05 DIAGNOSIS — N2581 Secondary hyperparathyroidism of renal origin: Secondary | ICD-10-CM | POA: Diagnosis not present

## 2019-08-05 DIAGNOSIS — D631 Anemia in chronic kidney disease: Secondary | ICD-10-CM | POA: Diagnosis not present

## 2019-08-05 DIAGNOSIS — N186 End stage renal disease: Secondary | ICD-10-CM | POA: Diagnosis not present

## 2019-08-05 DIAGNOSIS — Z992 Dependence on renal dialysis: Secondary | ICD-10-CM | POA: Diagnosis not present

## 2019-08-07 DIAGNOSIS — N2581 Secondary hyperparathyroidism of renal origin: Secondary | ICD-10-CM | POA: Diagnosis not present

## 2019-08-07 DIAGNOSIS — D631 Anemia in chronic kidney disease: Secondary | ICD-10-CM | POA: Diagnosis not present

## 2019-08-07 DIAGNOSIS — Z992 Dependence on renal dialysis: Secondary | ICD-10-CM | POA: Diagnosis not present

## 2019-08-07 DIAGNOSIS — N186 End stage renal disease: Secondary | ICD-10-CM | POA: Diagnosis not present

## 2019-08-08 DIAGNOSIS — I10 Essential (primary) hypertension: Secondary | ICD-10-CM | POA: Diagnosis not present

## 2019-08-08 DIAGNOSIS — L03116 Cellulitis of left lower limb: Secondary | ICD-10-CM | POA: Diagnosis not present

## 2019-08-08 DIAGNOSIS — E669 Obesity, unspecified: Secondary | ICD-10-CM | POA: Diagnosis not present

## 2019-08-08 DIAGNOSIS — Z87891 Personal history of nicotine dependence: Secondary | ICD-10-CM | POA: Diagnosis not present

## 2019-08-08 DIAGNOSIS — D638 Anemia in other chronic diseases classified elsewhere: Secondary | ICD-10-CM | POA: Diagnosis not present

## 2019-08-08 DIAGNOSIS — I739 Peripheral vascular disease, unspecified: Secondary | ICD-10-CM | POA: Diagnosis not present

## 2019-08-08 DIAGNOSIS — Z136 Encounter for screening for cardiovascular disorders: Secondary | ICD-10-CM | POA: Diagnosis not present

## 2019-08-08 DIAGNOSIS — G894 Chronic pain syndrome: Secondary | ICD-10-CM | POA: Diagnosis not present

## 2019-08-08 DIAGNOSIS — Z131 Encounter for screening for diabetes mellitus: Secondary | ICD-10-CM | POA: Diagnosis not present

## 2019-08-08 DIAGNOSIS — N186 End stage renal disease: Secondary | ICD-10-CM | POA: Diagnosis not present

## 2019-08-09 DIAGNOSIS — Z992 Dependence on renal dialysis: Secondary | ICD-10-CM | POA: Diagnosis not present

## 2019-08-09 DIAGNOSIS — D631 Anemia in chronic kidney disease: Secondary | ICD-10-CM | POA: Diagnosis not present

## 2019-08-09 DIAGNOSIS — N186 End stage renal disease: Secondary | ICD-10-CM | POA: Diagnosis not present

## 2019-08-09 DIAGNOSIS — N2581 Secondary hyperparathyroidism of renal origin: Secondary | ICD-10-CM | POA: Diagnosis not present

## 2019-08-12 DIAGNOSIS — N186 End stage renal disease: Secondary | ICD-10-CM | POA: Diagnosis not present

## 2019-08-12 DIAGNOSIS — Z992 Dependence on renal dialysis: Secondary | ICD-10-CM | POA: Diagnosis not present

## 2019-08-12 DIAGNOSIS — N2581 Secondary hyperparathyroidism of renal origin: Secondary | ICD-10-CM | POA: Diagnosis not present

## 2019-08-12 DIAGNOSIS — D631 Anemia in chronic kidney disease: Secondary | ICD-10-CM | POA: Diagnosis not present

## 2019-08-14 DIAGNOSIS — D631 Anemia in chronic kidney disease: Secondary | ICD-10-CM | POA: Diagnosis not present

## 2019-08-14 DIAGNOSIS — Z992 Dependence on renal dialysis: Secondary | ICD-10-CM | POA: Diagnosis not present

## 2019-08-14 DIAGNOSIS — N186 End stage renal disease: Secondary | ICD-10-CM | POA: Diagnosis not present

## 2019-08-14 DIAGNOSIS — N2581 Secondary hyperparathyroidism of renal origin: Secondary | ICD-10-CM | POA: Diagnosis not present

## 2019-08-16 DIAGNOSIS — D631 Anemia in chronic kidney disease: Secondary | ICD-10-CM | POA: Diagnosis not present

## 2019-08-16 DIAGNOSIS — N2581 Secondary hyperparathyroidism of renal origin: Secondary | ICD-10-CM | POA: Diagnosis not present

## 2019-08-16 DIAGNOSIS — N186 End stage renal disease: Secondary | ICD-10-CM | POA: Diagnosis not present

## 2019-08-16 DIAGNOSIS — Z992 Dependence on renal dialysis: Secondary | ICD-10-CM | POA: Diagnosis not present

## 2019-08-19 DIAGNOSIS — D631 Anemia in chronic kidney disease: Secondary | ICD-10-CM | POA: Diagnosis not present

## 2019-08-19 DIAGNOSIS — N2581 Secondary hyperparathyroidism of renal origin: Secondary | ICD-10-CM | POA: Diagnosis not present

## 2019-08-19 DIAGNOSIS — N186 End stage renal disease: Secondary | ICD-10-CM | POA: Diagnosis not present

## 2019-08-19 DIAGNOSIS — Z992 Dependence on renal dialysis: Secondary | ICD-10-CM | POA: Diagnosis not present

## 2019-08-21 DIAGNOSIS — N2581 Secondary hyperparathyroidism of renal origin: Secondary | ICD-10-CM | POA: Diagnosis not present

## 2019-08-21 DIAGNOSIS — Z992 Dependence on renal dialysis: Secondary | ICD-10-CM | POA: Diagnosis not present

## 2019-08-21 DIAGNOSIS — D631 Anemia in chronic kidney disease: Secondary | ICD-10-CM | POA: Diagnosis not present

## 2019-08-21 DIAGNOSIS — N186 End stage renal disease: Secondary | ICD-10-CM | POA: Diagnosis not present

## 2019-08-23 DIAGNOSIS — D631 Anemia in chronic kidney disease: Secondary | ICD-10-CM | POA: Diagnosis not present

## 2019-08-23 DIAGNOSIS — Z992 Dependence on renal dialysis: Secondary | ICD-10-CM | POA: Diagnosis not present

## 2019-08-23 DIAGNOSIS — N2581 Secondary hyperparathyroidism of renal origin: Secondary | ICD-10-CM | POA: Diagnosis not present

## 2019-08-23 DIAGNOSIS — N186 End stage renal disease: Secondary | ICD-10-CM | POA: Diagnosis not present

## 2019-08-27 DIAGNOSIS — I129 Hypertensive chronic kidney disease with stage 1 through stage 4 chronic kidney disease, or unspecified chronic kidney disease: Secondary | ICD-10-CM | POA: Diagnosis not present

## 2019-08-27 DIAGNOSIS — N186 End stage renal disease: Secondary | ICD-10-CM | POA: Diagnosis not present

## 2019-08-27 DIAGNOSIS — Z992 Dependence on renal dialysis: Secondary | ICD-10-CM | POA: Diagnosis not present

## 2019-08-28 DIAGNOSIS — D631 Anemia in chronic kidney disease: Secondary | ICD-10-CM | POA: Diagnosis not present

## 2019-08-28 DIAGNOSIS — N2581 Secondary hyperparathyroidism of renal origin: Secondary | ICD-10-CM | POA: Diagnosis not present

## 2019-08-28 DIAGNOSIS — Z992 Dependence on renal dialysis: Secondary | ICD-10-CM | POA: Diagnosis not present

## 2019-08-28 DIAGNOSIS — N186 End stage renal disease: Secondary | ICD-10-CM | POA: Diagnosis not present

## 2019-08-30 DIAGNOSIS — N2581 Secondary hyperparathyroidism of renal origin: Secondary | ICD-10-CM | POA: Diagnosis not present

## 2019-08-30 DIAGNOSIS — Z992 Dependence on renal dialysis: Secondary | ICD-10-CM | POA: Diagnosis not present

## 2019-08-30 DIAGNOSIS — D631 Anemia in chronic kidney disease: Secondary | ICD-10-CM | POA: Diagnosis not present

## 2019-08-30 DIAGNOSIS — N186 End stage renal disease: Secondary | ICD-10-CM | POA: Diagnosis not present

## 2019-09-02 DIAGNOSIS — Z992 Dependence on renal dialysis: Secondary | ICD-10-CM | POA: Diagnosis not present

## 2019-09-02 DIAGNOSIS — N2581 Secondary hyperparathyroidism of renal origin: Secondary | ICD-10-CM | POA: Diagnosis not present

## 2019-09-02 DIAGNOSIS — D631 Anemia in chronic kidney disease: Secondary | ICD-10-CM | POA: Diagnosis not present

## 2019-09-02 DIAGNOSIS — N186 End stage renal disease: Secondary | ICD-10-CM | POA: Diagnosis not present

## 2019-09-04 DIAGNOSIS — Z992 Dependence on renal dialysis: Secondary | ICD-10-CM | POA: Diagnosis not present

## 2019-09-04 DIAGNOSIS — N186 End stage renal disease: Secondary | ICD-10-CM | POA: Diagnosis not present

## 2019-09-04 DIAGNOSIS — D631 Anemia in chronic kidney disease: Secondary | ICD-10-CM | POA: Diagnosis not present

## 2019-09-04 DIAGNOSIS — N2581 Secondary hyperparathyroidism of renal origin: Secondary | ICD-10-CM | POA: Diagnosis not present

## 2019-09-06 DIAGNOSIS — N186 End stage renal disease: Secondary | ICD-10-CM | POA: Diagnosis not present

## 2019-09-06 DIAGNOSIS — D631 Anemia in chronic kidney disease: Secondary | ICD-10-CM | POA: Diagnosis not present

## 2019-09-06 DIAGNOSIS — N2581 Secondary hyperparathyroidism of renal origin: Secondary | ICD-10-CM | POA: Diagnosis not present

## 2019-09-06 DIAGNOSIS — Z992 Dependence on renal dialysis: Secondary | ICD-10-CM | POA: Diagnosis not present

## 2019-09-09 DIAGNOSIS — Z992 Dependence on renal dialysis: Secondary | ICD-10-CM | POA: Diagnosis not present

## 2019-09-09 DIAGNOSIS — D631 Anemia in chronic kidney disease: Secondary | ICD-10-CM | POA: Diagnosis not present

## 2019-09-09 DIAGNOSIS — N2581 Secondary hyperparathyroidism of renal origin: Secondary | ICD-10-CM | POA: Diagnosis not present

## 2019-09-09 DIAGNOSIS — N186 End stage renal disease: Secondary | ICD-10-CM | POA: Diagnosis not present

## 2019-09-11 DIAGNOSIS — D631 Anemia in chronic kidney disease: Secondary | ICD-10-CM | POA: Diagnosis not present

## 2019-09-11 DIAGNOSIS — N186 End stage renal disease: Secondary | ICD-10-CM | POA: Diagnosis not present

## 2019-09-11 DIAGNOSIS — N2581 Secondary hyperparathyroidism of renal origin: Secondary | ICD-10-CM | POA: Diagnosis not present

## 2019-09-11 DIAGNOSIS — Z992 Dependence on renal dialysis: Secondary | ICD-10-CM | POA: Diagnosis not present

## 2019-09-13 DIAGNOSIS — D631 Anemia in chronic kidney disease: Secondary | ICD-10-CM | POA: Diagnosis not present

## 2019-09-13 DIAGNOSIS — N186 End stage renal disease: Secondary | ICD-10-CM | POA: Diagnosis not present

## 2019-09-13 DIAGNOSIS — Z992 Dependence on renal dialysis: Secondary | ICD-10-CM | POA: Diagnosis not present

## 2019-09-13 DIAGNOSIS — N2581 Secondary hyperparathyroidism of renal origin: Secondary | ICD-10-CM | POA: Diagnosis not present

## 2019-09-16 DIAGNOSIS — N2581 Secondary hyperparathyroidism of renal origin: Secondary | ICD-10-CM | POA: Diagnosis not present

## 2019-09-16 DIAGNOSIS — N186 End stage renal disease: Secondary | ICD-10-CM | POA: Diagnosis not present

## 2019-09-16 DIAGNOSIS — D631 Anemia in chronic kidney disease: Secondary | ICD-10-CM | POA: Diagnosis not present

## 2019-09-16 DIAGNOSIS — Z992 Dependence on renal dialysis: Secondary | ICD-10-CM | POA: Diagnosis not present

## 2019-09-18 DIAGNOSIS — N186 End stage renal disease: Secondary | ICD-10-CM | POA: Diagnosis not present

## 2019-09-18 DIAGNOSIS — N2581 Secondary hyperparathyroidism of renal origin: Secondary | ICD-10-CM | POA: Diagnosis not present

## 2019-09-18 DIAGNOSIS — D631 Anemia in chronic kidney disease: Secondary | ICD-10-CM | POA: Diagnosis not present

## 2019-09-18 DIAGNOSIS — Z992 Dependence on renal dialysis: Secondary | ICD-10-CM | POA: Diagnosis not present

## 2019-09-20 DIAGNOSIS — Z992 Dependence on renal dialysis: Secondary | ICD-10-CM | POA: Diagnosis not present

## 2019-09-20 DIAGNOSIS — D631 Anemia in chronic kidney disease: Secondary | ICD-10-CM | POA: Diagnosis not present

## 2019-09-20 DIAGNOSIS — N2581 Secondary hyperparathyroidism of renal origin: Secondary | ICD-10-CM | POA: Diagnosis not present

## 2019-09-20 DIAGNOSIS — N186 End stage renal disease: Secondary | ICD-10-CM | POA: Diagnosis not present

## 2019-09-23 DIAGNOSIS — N186 End stage renal disease: Secondary | ICD-10-CM | POA: Diagnosis not present

## 2019-09-23 DIAGNOSIS — N2581 Secondary hyperparathyroidism of renal origin: Secondary | ICD-10-CM | POA: Diagnosis not present

## 2019-09-23 DIAGNOSIS — D631 Anemia in chronic kidney disease: Secondary | ICD-10-CM | POA: Diagnosis not present

## 2019-09-23 DIAGNOSIS — Z992 Dependence on renal dialysis: Secondary | ICD-10-CM | POA: Diagnosis not present

## 2019-09-25 DIAGNOSIS — Z992 Dependence on renal dialysis: Secondary | ICD-10-CM | POA: Diagnosis not present

## 2019-09-25 DIAGNOSIS — N2581 Secondary hyperparathyroidism of renal origin: Secondary | ICD-10-CM | POA: Diagnosis not present

## 2019-09-25 DIAGNOSIS — D631 Anemia in chronic kidney disease: Secondary | ICD-10-CM | POA: Diagnosis not present

## 2019-09-25 DIAGNOSIS — N186 End stage renal disease: Secondary | ICD-10-CM | POA: Diagnosis not present

## 2019-09-26 DIAGNOSIS — I129 Hypertensive chronic kidney disease with stage 1 through stage 4 chronic kidney disease, or unspecified chronic kidney disease: Secondary | ICD-10-CM | POA: Diagnosis not present

## 2019-09-26 DIAGNOSIS — Z992 Dependence on renal dialysis: Secondary | ICD-10-CM | POA: Diagnosis not present

## 2019-09-26 DIAGNOSIS — N186 End stage renal disease: Secondary | ICD-10-CM | POA: Diagnosis not present

## 2019-09-27 DIAGNOSIS — T782XXS Anaphylactic shock, unspecified, sequela: Secondary | ICD-10-CM | POA: Diagnosis not present

## 2019-09-27 DIAGNOSIS — N2581 Secondary hyperparathyroidism of renal origin: Secondary | ICD-10-CM | POA: Diagnosis not present

## 2019-09-27 DIAGNOSIS — D509 Iron deficiency anemia, unspecified: Secondary | ICD-10-CM | POA: Diagnosis not present

## 2019-09-27 DIAGNOSIS — Z992 Dependence on renal dialysis: Secondary | ICD-10-CM | POA: Diagnosis not present

## 2019-09-27 DIAGNOSIS — N186 End stage renal disease: Secondary | ICD-10-CM | POA: Diagnosis not present

## 2019-09-27 DIAGNOSIS — D631 Anemia in chronic kidney disease: Secondary | ICD-10-CM | POA: Diagnosis not present

## 2019-09-30 DIAGNOSIS — Z992 Dependence on renal dialysis: Secondary | ICD-10-CM | POA: Diagnosis not present

## 2019-09-30 DIAGNOSIS — N2581 Secondary hyperparathyroidism of renal origin: Secondary | ICD-10-CM | POA: Diagnosis not present

## 2019-09-30 DIAGNOSIS — N186 End stage renal disease: Secondary | ICD-10-CM | POA: Diagnosis not present

## 2019-09-30 DIAGNOSIS — D631 Anemia in chronic kidney disease: Secondary | ICD-10-CM | POA: Diagnosis not present

## 2019-09-30 DIAGNOSIS — T782XXS Anaphylactic shock, unspecified, sequela: Secondary | ICD-10-CM | POA: Diagnosis not present

## 2019-09-30 DIAGNOSIS — D509 Iron deficiency anemia, unspecified: Secondary | ICD-10-CM | POA: Diagnosis not present

## 2019-10-02 DIAGNOSIS — N2581 Secondary hyperparathyroidism of renal origin: Secondary | ICD-10-CM | POA: Diagnosis not present

## 2019-10-02 DIAGNOSIS — T782XXS Anaphylactic shock, unspecified, sequela: Secondary | ICD-10-CM | POA: Diagnosis not present

## 2019-10-02 DIAGNOSIS — D631 Anemia in chronic kidney disease: Secondary | ICD-10-CM | POA: Diagnosis not present

## 2019-10-02 DIAGNOSIS — Z992 Dependence on renal dialysis: Secondary | ICD-10-CM | POA: Diagnosis not present

## 2019-10-02 DIAGNOSIS — N186 End stage renal disease: Secondary | ICD-10-CM | POA: Diagnosis not present

## 2019-10-02 DIAGNOSIS — D509 Iron deficiency anemia, unspecified: Secondary | ICD-10-CM | POA: Diagnosis not present

## 2019-10-07 DIAGNOSIS — D509 Iron deficiency anemia, unspecified: Secondary | ICD-10-CM | POA: Diagnosis not present

## 2019-10-07 DIAGNOSIS — D631 Anemia in chronic kidney disease: Secondary | ICD-10-CM | POA: Diagnosis not present

## 2019-10-07 DIAGNOSIS — N2581 Secondary hyperparathyroidism of renal origin: Secondary | ICD-10-CM | POA: Diagnosis not present

## 2019-10-07 DIAGNOSIS — T782XXS Anaphylactic shock, unspecified, sequela: Secondary | ICD-10-CM | POA: Diagnosis not present

## 2019-10-07 DIAGNOSIS — N186 End stage renal disease: Secondary | ICD-10-CM | POA: Diagnosis not present

## 2019-10-07 DIAGNOSIS — Z992 Dependence on renal dialysis: Secondary | ICD-10-CM | POA: Diagnosis not present

## 2019-10-09 DIAGNOSIS — Z992 Dependence on renal dialysis: Secondary | ICD-10-CM | POA: Diagnosis not present

## 2019-10-09 DIAGNOSIS — D631 Anemia in chronic kidney disease: Secondary | ICD-10-CM | POA: Diagnosis not present

## 2019-10-09 DIAGNOSIS — D509 Iron deficiency anemia, unspecified: Secondary | ICD-10-CM | POA: Diagnosis not present

## 2019-10-09 DIAGNOSIS — N2581 Secondary hyperparathyroidism of renal origin: Secondary | ICD-10-CM | POA: Diagnosis not present

## 2019-10-09 DIAGNOSIS — N186 End stage renal disease: Secondary | ICD-10-CM | POA: Diagnosis not present

## 2019-10-09 DIAGNOSIS — T782XXS Anaphylactic shock, unspecified, sequela: Secondary | ICD-10-CM | POA: Diagnosis not present

## 2019-10-11 DIAGNOSIS — Z992 Dependence on renal dialysis: Secondary | ICD-10-CM | POA: Diagnosis not present

## 2019-10-11 DIAGNOSIS — T782XXS Anaphylactic shock, unspecified, sequela: Secondary | ICD-10-CM | POA: Diagnosis not present

## 2019-10-11 DIAGNOSIS — N186 End stage renal disease: Secondary | ICD-10-CM | POA: Diagnosis not present

## 2019-10-11 DIAGNOSIS — N2581 Secondary hyperparathyroidism of renal origin: Secondary | ICD-10-CM | POA: Diagnosis not present

## 2019-10-11 DIAGNOSIS — D631 Anemia in chronic kidney disease: Secondary | ICD-10-CM | POA: Diagnosis not present

## 2019-10-11 DIAGNOSIS — D509 Iron deficiency anemia, unspecified: Secondary | ICD-10-CM | POA: Diagnosis not present

## 2019-10-14 DIAGNOSIS — T782XXS Anaphylactic shock, unspecified, sequela: Secondary | ICD-10-CM | POA: Diagnosis not present

## 2019-10-14 DIAGNOSIS — D509 Iron deficiency anemia, unspecified: Secondary | ICD-10-CM | POA: Diagnosis not present

## 2019-10-14 DIAGNOSIS — D631 Anemia in chronic kidney disease: Secondary | ICD-10-CM | POA: Diagnosis not present

## 2019-10-14 DIAGNOSIS — N186 End stage renal disease: Secondary | ICD-10-CM | POA: Diagnosis not present

## 2019-10-14 DIAGNOSIS — Z992 Dependence on renal dialysis: Secondary | ICD-10-CM | POA: Diagnosis not present

## 2019-10-14 DIAGNOSIS — N2581 Secondary hyperparathyroidism of renal origin: Secondary | ICD-10-CM | POA: Diagnosis not present

## 2019-10-16 DIAGNOSIS — T782XXS Anaphylactic shock, unspecified, sequela: Secondary | ICD-10-CM | POA: Diagnosis not present

## 2019-10-16 DIAGNOSIS — Z992 Dependence on renal dialysis: Secondary | ICD-10-CM | POA: Diagnosis not present

## 2019-10-16 DIAGNOSIS — N2581 Secondary hyperparathyroidism of renal origin: Secondary | ICD-10-CM | POA: Diagnosis not present

## 2019-10-16 DIAGNOSIS — D631 Anemia in chronic kidney disease: Secondary | ICD-10-CM | POA: Diagnosis not present

## 2019-10-16 DIAGNOSIS — N186 End stage renal disease: Secondary | ICD-10-CM | POA: Diagnosis not present

## 2019-10-16 DIAGNOSIS — D509 Iron deficiency anemia, unspecified: Secondary | ICD-10-CM | POA: Diagnosis not present

## 2019-10-18 DIAGNOSIS — Z992 Dependence on renal dialysis: Secondary | ICD-10-CM | POA: Diagnosis not present

## 2019-10-18 DIAGNOSIS — D631 Anemia in chronic kidney disease: Secondary | ICD-10-CM | POA: Diagnosis not present

## 2019-10-18 DIAGNOSIS — T782XXS Anaphylactic shock, unspecified, sequela: Secondary | ICD-10-CM | POA: Diagnosis not present

## 2019-10-18 DIAGNOSIS — D509 Iron deficiency anemia, unspecified: Secondary | ICD-10-CM | POA: Diagnosis not present

## 2019-10-18 DIAGNOSIS — N2581 Secondary hyperparathyroidism of renal origin: Secondary | ICD-10-CM | POA: Diagnosis not present

## 2019-10-18 DIAGNOSIS — N186 End stage renal disease: Secondary | ICD-10-CM | POA: Diagnosis not present

## 2019-10-21 DIAGNOSIS — N186 End stage renal disease: Secondary | ICD-10-CM | POA: Diagnosis not present

## 2019-10-21 DIAGNOSIS — D631 Anemia in chronic kidney disease: Secondary | ICD-10-CM | POA: Diagnosis not present

## 2019-10-21 DIAGNOSIS — D509 Iron deficiency anemia, unspecified: Secondary | ICD-10-CM | POA: Diagnosis not present

## 2019-10-21 DIAGNOSIS — N2581 Secondary hyperparathyroidism of renal origin: Secondary | ICD-10-CM | POA: Diagnosis not present

## 2019-10-21 DIAGNOSIS — T782XXS Anaphylactic shock, unspecified, sequela: Secondary | ICD-10-CM | POA: Diagnosis not present

## 2019-10-21 DIAGNOSIS — Z992 Dependence on renal dialysis: Secondary | ICD-10-CM | POA: Diagnosis not present

## 2019-10-23 DIAGNOSIS — N186 End stage renal disease: Secondary | ICD-10-CM | POA: Diagnosis not present

## 2019-10-23 DIAGNOSIS — Z992 Dependence on renal dialysis: Secondary | ICD-10-CM | POA: Diagnosis not present

## 2019-10-23 DIAGNOSIS — D631 Anemia in chronic kidney disease: Secondary | ICD-10-CM | POA: Diagnosis not present

## 2019-10-23 DIAGNOSIS — T782XXS Anaphylactic shock, unspecified, sequela: Secondary | ICD-10-CM | POA: Diagnosis not present

## 2019-10-23 DIAGNOSIS — N2581 Secondary hyperparathyroidism of renal origin: Secondary | ICD-10-CM | POA: Diagnosis not present

## 2019-10-23 DIAGNOSIS — D509 Iron deficiency anemia, unspecified: Secondary | ICD-10-CM | POA: Diagnosis not present

## 2019-10-25 DIAGNOSIS — D631 Anemia in chronic kidney disease: Secondary | ICD-10-CM | POA: Diagnosis not present

## 2019-10-25 DIAGNOSIS — N186 End stage renal disease: Secondary | ICD-10-CM | POA: Diagnosis not present

## 2019-10-25 DIAGNOSIS — Z992 Dependence on renal dialysis: Secondary | ICD-10-CM | POA: Diagnosis not present

## 2019-10-25 DIAGNOSIS — D509 Iron deficiency anemia, unspecified: Secondary | ICD-10-CM | POA: Diagnosis not present

## 2019-10-25 DIAGNOSIS — N2581 Secondary hyperparathyroidism of renal origin: Secondary | ICD-10-CM | POA: Diagnosis not present

## 2019-10-25 DIAGNOSIS — T782XXS Anaphylactic shock, unspecified, sequela: Secondary | ICD-10-CM | POA: Diagnosis not present

## 2019-10-26 DIAGNOSIS — Z992 Dependence on renal dialysis: Secondary | ICD-10-CM | POA: Diagnosis not present

## 2019-10-26 DIAGNOSIS — N2581 Secondary hyperparathyroidism of renal origin: Secondary | ICD-10-CM | POA: Diagnosis not present

## 2019-10-26 DIAGNOSIS — N186 End stage renal disease: Secondary | ICD-10-CM | POA: Diagnosis not present

## 2019-10-26 DIAGNOSIS — E8770 Fluid overload, unspecified: Secondary | ICD-10-CM | POA: Diagnosis not present

## 2019-10-27 DIAGNOSIS — Z992 Dependence on renal dialysis: Secondary | ICD-10-CM | POA: Diagnosis not present

## 2019-10-27 DIAGNOSIS — I129 Hypertensive chronic kidney disease with stage 1 through stage 4 chronic kidney disease, or unspecified chronic kidney disease: Secondary | ICD-10-CM | POA: Diagnosis not present

## 2019-10-27 DIAGNOSIS — N186 End stage renal disease: Secondary | ICD-10-CM | POA: Diagnosis not present

## 2019-10-28 DIAGNOSIS — N186 End stage renal disease: Secondary | ICD-10-CM | POA: Diagnosis not present

## 2019-10-28 DIAGNOSIS — D631 Anemia in chronic kidney disease: Secondary | ICD-10-CM | POA: Diagnosis not present

## 2019-10-28 DIAGNOSIS — Z992 Dependence on renal dialysis: Secondary | ICD-10-CM | POA: Diagnosis not present

## 2019-10-28 DIAGNOSIS — N2581 Secondary hyperparathyroidism of renal origin: Secondary | ICD-10-CM | POA: Diagnosis not present

## 2019-10-29 DIAGNOSIS — D638 Anemia in other chronic diseases classified elsewhere: Secondary | ICD-10-CM | POA: Diagnosis not present

## 2019-10-29 DIAGNOSIS — Z87891 Personal history of nicotine dependence: Secondary | ICD-10-CM | POA: Diagnosis not present

## 2019-10-29 DIAGNOSIS — B373 Candidiasis of vulva and vagina: Secondary | ICD-10-CM | POA: Diagnosis not present

## 2019-10-29 DIAGNOSIS — Z2821 Immunization not carried out because of patient refusal: Secondary | ICD-10-CM | POA: Diagnosis not present

## 2019-10-29 DIAGNOSIS — E669 Obesity, unspecified: Secondary | ICD-10-CM | POA: Diagnosis not present

## 2019-10-29 DIAGNOSIS — N186 End stage renal disease: Secondary | ICD-10-CM | POA: Diagnosis not present

## 2019-10-29 DIAGNOSIS — G894 Chronic pain syndrome: Secondary | ICD-10-CM | POA: Diagnosis not present

## 2019-10-29 DIAGNOSIS — L03116 Cellulitis of left lower limb: Secondary | ICD-10-CM | POA: Diagnosis not present

## 2019-10-29 DIAGNOSIS — I10 Essential (primary) hypertension: Secondary | ICD-10-CM | POA: Diagnosis not present

## 2019-10-29 DIAGNOSIS — I739 Peripheral vascular disease, unspecified: Secondary | ICD-10-CM | POA: Diagnosis not present

## 2019-10-30 DIAGNOSIS — D631 Anemia in chronic kidney disease: Secondary | ICD-10-CM | POA: Diagnosis not present

## 2019-10-30 DIAGNOSIS — Z992 Dependence on renal dialysis: Secondary | ICD-10-CM | POA: Diagnosis not present

## 2019-10-30 DIAGNOSIS — N2581 Secondary hyperparathyroidism of renal origin: Secondary | ICD-10-CM | POA: Diagnosis not present

## 2019-10-30 DIAGNOSIS — N186 End stage renal disease: Secondary | ICD-10-CM | POA: Diagnosis not present

## 2019-11-01 DIAGNOSIS — D631 Anemia in chronic kidney disease: Secondary | ICD-10-CM | POA: Diagnosis not present

## 2019-11-01 DIAGNOSIS — N2581 Secondary hyperparathyroidism of renal origin: Secondary | ICD-10-CM | POA: Diagnosis not present

## 2019-11-01 DIAGNOSIS — N186 End stage renal disease: Secondary | ICD-10-CM | POA: Diagnosis not present

## 2019-11-01 DIAGNOSIS — Z992 Dependence on renal dialysis: Secondary | ICD-10-CM | POA: Diagnosis not present

## 2019-11-04 DIAGNOSIS — N2581 Secondary hyperparathyroidism of renal origin: Secondary | ICD-10-CM | POA: Diagnosis not present

## 2019-11-04 DIAGNOSIS — D631 Anemia in chronic kidney disease: Secondary | ICD-10-CM | POA: Diagnosis not present

## 2019-11-04 DIAGNOSIS — Z992 Dependence on renal dialysis: Secondary | ICD-10-CM | POA: Diagnosis not present

## 2019-11-04 DIAGNOSIS — N186 End stage renal disease: Secondary | ICD-10-CM | POA: Diagnosis not present

## 2019-11-06 DIAGNOSIS — N186 End stage renal disease: Secondary | ICD-10-CM | POA: Diagnosis not present

## 2019-11-06 DIAGNOSIS — Z992 Dependence on renal dialysis: Secondary | ICD-10-CM | POA: Diagnosis not present

## 2019-11-06 DIAGNOSIS — N2581 Secondary hyperparathyroidism of renal origin: Secondary | ICD-10-CM | POA: Diagnosis not present

## 2019-11-06 DIAGNOSIS — D631 Anemia in chronic kidney disease: Secondary | ICD-10-CM | POA: Diagnosis not present

## 2019-11-07 DIAGNOSIS — N186 End stage renal disease: Secondary | ICD-10-CM | POA: Diagnosis not present

## 2019-11-07 DIAGNOSIS — Z5329 Procedure and treatment not carried out because of patient's decision for other reasons: Secondary | ICD-10-CM | POA: Diagnosis not present

## 2019-11-07 DIAGNOSIS — N2581 Secondary hyperparathyroidism of renal origin: Secondary | ICD-10-CM | POA: Diagnosis not present

## 2019-11-07 DIAGNOSIS — Z992 Dependence on renal dialysis: Secondary | ICD-10-CM | POA: Diagnosis not present

## 2019-11-07 DIAGNOSIS — E8770 Fluid overload, unspecified: Secondary | ICD-10-CM | POA: Diagnosis not present

## 2019-11-08 DIAGNOSIS — Z992 Dependence on renal dialysis: Secondary | ICD-10-CM | POA: Diagnosis not present

## 2019-11-08 DIAGNOSIS — N186 End stage renal disease: Secondary | ICD-10-CM | POA: Diagnosis not present

## 2019-11-08 DIAGNOSIS — D631 Anemia in chronic kidney disease: Secondary | ICD-10-CM | POA: Diagnosis not present

## 2019-11-08 DIAGNOSIS — N2581 Secondary hyperparathyroidism of renal origin: Secondary | ICD-10-CM | POA: Diagnosis not present

## 2019-11-11 DIAGNOSIS — Z992 Dependence on renal dialysis: Secondary | ICD-10-CM | POA: Diagnosis not present

## 2019-11-11 DIAGNOSIS — N2581 Secondary hyperparathyroidism of renal origin: Secondary | ICD-10-CM | POA: Diagnosis not present

## 2019-11-11 DIAGNOSIS — N186 End stage renal disease: Secondary | ICD-10-CM | POA: Diagnosis not present

## 2019-11-11 DIAGNOSIS — D631 Anemia in chronic kidney disease: Secondary | ICD-10-CM | POA: Diagnosis not present

## 2019-11-13 DIAGNOSIS — N2581 Secondary hyperparathyroidism of renal origin: Secondary | ICD-10-CM | POA: Diagnosis not present

## 2019-11-13 DIAGNOSIS — Z992 Dependence on renal dialysis: Secondary | ICD-10-CM | POA: Diagnosis not present

## 2019-11-13 DIAGNOSIS — D631 Anemia in chronic kidney disease: Secondary | ICD-10-CM | POA: Diagnosis not present

## 2019-11-13 DIAGNOSIS — N186 End stage renal disease: Secondary | ICD-10-CM | POA: Diagnosis not present

## 2019-11-15 DIAGNOSIS — D631 Anemia in chronic kidney disease: Secondary | ICD-10-CM | POA: Diagnosis not present

## 2019-11-15 DIAGNOSIS — N186 End stage renal disease: Secondary | ICD-10-CM | POA: Diagnosis not present

## 2019-11-15 DIAGNOSIS — Z992 Dependence on renal dialysis: Secondary | ICD-10-CM | POA: Diagnosis not present

## 2019-11-15 DIAGNOSIS — N2581 Secondary hyperparathyroidism of renal origin: Secondary | ICD-10-CM | POA: Diagnosis not present

## 2019-11-17 DIAGNOSIS — N186 End stage renal disease: Secondary | ICD-10-CM | POA: Diagnosis not present

## 2019-11-17 DIAGNOSIS — N2581 Secondary hyperparathyroidism of renal origin: Secondary | ICD-10-CM | POA: Diagnosis not present

## 2019-11-17 DIAGNOSIS — D631 Anemia in chronic kidney disease: Secondary | ICD-10-CM | POA: Diagnosis not present

## 2019-11-17 DIAGNOSIS — Z992 Dependence on renal dialysis: Secondary | ICD-10-CM | POA: Diagnosis not present

## 2019-11-19 DIAGNOSIS — Z992 Dependence on renal dialysis: Secondary | ICD-10-CM | POA: Diagnosis not present

## 2019-11-19 DIAGNOSIS — N2581 Secondary hyperparathyroidism of renal origin: Secondary | ICD-10-CM | POA: Diagnosis not present

## 2019-11-19 DIAGNOSIS — N186 End stage renal disease: Secondary | ICD-10-CM | POA: Diagnosis not present

## 2019-11-19 DIAGNOSIS — D631 Anemia in chronic kidney disease: Secondary | ICD-10-CM | POA: Diagnosis not present

## 2019-11-22 DIAGNOSIS — Z992 Dependence on renal dialysis: Secondary | ICD-10-CM | POA: Diagnosis not present

## 2019-11-22 DIAGNOSIS — N2581 Secondary hyperparathyroidism of renal origin: Secondary | ICD-10-CM | POA: Diagnosis not present

## 2019-11-22 DIAGNOSIS — N186 End stage renal disease: Secondary | ICD-10-CM | POA: Diagnosis not present

## 2019-11-22 DIAGNOSIS — D631 Anemia in chronic kidney disease: Secondary | ICD-10-CM | POA: Diagnosis not present

## 2019-11-25 DIAGNOSIS — D631 Anemia in chronic kidney disease: Secondary | ICD-10-CM | POA: Diagnosis not present

## 2019-11-25 DIAGNOSIS — Z992 Dependence on renal dialysis: Secondary | ICD-10-CM | POA: Diagnosis not present

## 2019-11-25 DIAGNOSIS — N2581 Secondary hyperparathyroidism of renal origin: Secondary | ICD-10-CM | POA: Diagnosis not present

## 2019-11-25 DIAGNOSIS — N186 End stage renal disease: Secondary | ICD-10-CM | POA: Diagnosis not present

## 2019-11-27 DIAGNOSIS — D509 Iron deficiency anemia, unspecified: Secondary | ICD-10-CM | POA: Diagnosis not present

## 2019-11-27 DIAGNOSIS — Z992 Dependence on renal dialysis: Secondary | ICD-10-CM | POA: Diagnosis not present

## 2019-11-27 DIAGNOSIS — N2581 Secondary hyperparathyroidism of renal origin: Secondary | ICD-10-CM | POA: Diagnosis not present

## 2019-11-27 DIAGNOSIS — N186 End stage renal disease: Secondary | ICD-10-CM | POA: Diagnosis not present

## 2019-11-27 DIAGNOSIS — D631 Anemia in chronic kidney disease: Secondary | ICD-10-CM | POA: Diagnosis not present

## 2019-11-29 DIAGNOSIS — D631 Anemia in chronic kidney disease: Secondary | ICD-10-CM | POA: Diagnosis not present

## 2019-11-29 DIAGNOSIS — Z992 Dependence on renal dialysis: Secondary | ICD-10-CM | POA: Diagnosis not present

## 2019-11-29 DIAGNOSIS — D509 Iron deficiency anemia, unspecified: Secondary | ICD-10-CM | POA: Diagnosis not present

## 2019-11-29 DIAGNOSIS — N2581 Secondary hyperparathyroidism of renal origin: Secondary | ICD-10-CM | POA: Diagnosis not present

## 2019-11-29 DIAGNOSIS — N186 End stage renal disease: Secondary | ICD-10-CM | POA: Diagnosis not present

## 2019-12-02 DIAGNOSIS — Z992 Dependence on renal dialysis: Secondary | ICD-10-CM | POA: Diagnosis not present

## 2019-12-02 DIAGNOSIS — D631 Anemia in chronic kidney disease: Secondary | ICD-10-CM | POA: Diagnosis not present

## 2019-12-02 DIAGNOSIS — D509 Iron deficiency anemia, unspecified: Secondary | ICD-10-CM | POA: Diagnosis not present

## 2019-12-02 DIAGNOSIS — N186 End stage renal disease: Secondary | ICD-10-CM | POA: Diagnosis not present

## 2019-12-02 DIAGNOSIS — N2581 Secondary hyperparathyroidism of renal origin: Secondary | ICD-10-CM | POA: Diagnosis not present

## 2019-12-04 DIAGNOSIS — Z992 Dependence on renal dialysis: Secondary | ICD-10-CM | POA: Diagnosis not present

## 2019-12-04 DIAGNOSIS — D509 Iron deficiency anemia, unspecified: Secondary | ICD-10-CM | POA: Diagnosis not present

## 2019-12-04 DIAGNOSIS — N186 End stage renal disease: Secondary | ICD-10-CM | POA: Diagnosis not present

## 2019-12-04 DIAGNOSIS — N2581 Secondary hyperparathyroidism of renal origin: Secondary | ICD-10-CM | POA: Diagnosis not present

## 2019-12-04 DIAGNOSIS — D631 Anemia in chronic kidney disease: Secondary | ICD-10-CM | POA: Diagnosis not present

## 2019-12-06 DIAGNOSIS — Z992 Dependence on renal dialysis: Secondary | ICD-10-CM | POA: Diagnosis not present

## 2019-12-06 DIAGNOSIS — N186 End stage renal disease: Secondary | ICD-10-CM | POA: Diagnosis not present

## 2019-12-06 DIAGNOSIS — N2581 Secondary hyperparathyroidism of renal origin: Secondary | ICD-10-CM | POA: Diagnosis not present

## 2019-12-06 DIAGNOSIS — D631 Anemia in chronic kidney disease: Secondary | ICD-10-CM | POA: Diagnosis not present

## 2019-12-06 DIAGNOSIS — D509 Iron deficiency anemia, unspecified: Secondary | ICD-10-CM | POA: Diagnosis not present

## 2019-12-09 DIAGNOSIS — D509 Iron deficiency anemia, unspecified: Secondary | ICD-10-CM | POA: Diagnosis not present

## 2019-12-09 DIAGNOSIS — N2581 Secondary hyperparathyroidism of renal origin: Secondary | ICD-10-CM | POA: Diagnosis not present

## 2019-12-09 DIAGNOSIS — N186 End stage renal disease: Secondary | ICD-10-CM | POA: Diagnosis not present

## 2019-12-09 DIAGNOSIS — D631 Anemia in chronic kidney disease: Secondary | ICD-10-CM | POA: Diagnosis not present

## 2019-12-09 DIAGNOSIS — Z992 Dependence on renal dialysis: Secondary | ICD-10-CM | POA: Diagnosis not present

## 2019-12-11 DIAGNOSIS — N2581 Secondary hyperparathyroidism of renal origin: Secondary | ICD-10-CM | POA: Diagnosis not present

## 2019-12-11 DIAGNOSIS — Z992 Dependence on renal dialysis: Secondary | ICD-10-CM | POA: Diagnosis not present

## 2019-12-11 DIAGNOSIS — D509 Iron deficiency anemia, unspecified: Secondary | ICD-10-CM | POA: Diagnosis not present

## 2019-12-11 DIAGNOSIS — D631 Anemia in chronic kidney disease: Secondary | ICD-10-CM | POA: Diagnosis not present

## 2019-12-11 DIAGNOSIS — N186 End stage renal disease: Secondary | ICD-10-CM | POA: Diagnosis not present

## 2019-12-13 DIAGNOSIS — D631 Anemia in chronic kidney disease: Secondary | ICD-10-CM | POA: Diagnosis not present

## 2019-12-13 DIAGNOSIS — N186 End stage renal disease: Secondary | ICD-10-CM | POA: Diagnosis not present

## 2019-12-13 DIAGNOSIS — Z992 Dependence on renal dialysis: Secondary | ICD-10-CM | POA: Diagnosis not present

## 2019-12-13 DIAGNOSIS — D509 Iron deficiency anemia, unspecified: Secondary | ICD-10-CM | POA: Diagnosis not present

## 2019-12-13 DIAGNOSIS — N2581 Secondary hyperparathyroidism of renal origin: Secondary | ICD-10-CM | POA: Diagnosis not present

## 2019-12-16 DIAGNOSIS — D509 Iron deficiency anemia, unspecified: Secondary | ICD-10-CM | POA: Diagnosis not present

## 2019-12-16 DIAGNOSIS — D631 Anemia in chronic kidney disease: Secondary | ICD-10-CM | POA: Diagnosis not present

## 2019-12-16 DIAGNOSIS — N186 End stage renal disease: Secondary | ICD-10-CM | POA: Diagnosis not present

## 2019-12-16 DIAGNOSIS — N2581 Secondary hyperparathyroidism of renal origin: Secondary | ICD-10-CM | POA: Diagnosis not present

## 2019-12-16 DIAGNOSIS — Z992 Dependence on renal dialysis: Secondary | ICD-10-CM | POA: Diagnosis not present

## 2019-12-18 DIAGNOSIS — N186 End stage renal disease: Secondary | ICD-10-CM | POA: Diagnosis not present

## 2019-12-18 DIAGNOSIS — D509 Iron deficiency anemia, unspecified: Secondary | ICD-10-CM | POA: Diagnosis not present

## 2019-12-18 DIAGNOSIS — Z992 Dependence on renal dialysis: Secondary | ICD-10-CM | POA: Diagnosis not present

## 2019-12-18 DIAGNOSIS — D631 Anemia in chronic kidney disease: Secondary | ICD-10-CM | POA: Diagnosis not present

## 2019-12-18 DIAGNOSIS — N2581 Secondary hyperparathyroidism of renal origin: Secondary | ICD-10-CM | POA: Diagnosis not present

## 2019-12-21 DIAGNOSIS — N186 End stage renal disease: Secondary | ICD-10-CM | POA: Diagnosis not present

## 2019-12-21 DIAGNOSIS — D631 Anemia in chronic kidney disease: Secondary | ICD-10-CM | POA: Diagnosis not present

## 2019-12-21 DIAGNOSIS — Z992 Dependence on renal dialysis: Secondary | ICD-10-CM | POA: Diagnosis not present

## 2019-12-21 DIAGNOSIS — N2581 Secondary hyperparathyroidism of renal origin: Secondary | ICD-10-CM | POA: Diagnosis not present

## 2019-12-21 DIAGNOSIS — D509 Iron deficiency anemia, unspecified: Secondary | ICD-10-CM | POA: Diagnosis not present

## 2019-12-23 DIAGNOSIS — D631 Anemia in chronic kidney disease: Secondary | ICD-10-CM | POA: Diagnosis not present

## 2019-12-23 DIAGNOSIS — Z992 Dependence on renal dialysis: Secondary | ICD-10-CM | POA: Diagnosis not present

## 2019-12-23 DIAGNOSIS — D509 Iron deficiency anemia, unspecified: Secondary | ICD-10-CM | POA: Diagnosis not present

## 2019-12-23 DIAGNOSIS — N2581 Secondary hyperparathyroidism of renal origin: Secondary | ICD-10-CM | POA: Diagnosis not present

## 2019-12-23 DIAGNOSIS — N186 End stage renal disease: Secondary | ICD-10-CM | POA: Diagnosis not present

## 2019-12-25 DIAGNOSIS — Z992 Dependence on renal dialysis: Secondary | ICD-10-CM | POA: Diagnosis not present

## 2019-12-25 DIAGNOSIS — D509 Iron deficiency anemia, unspecified: Secondary | ICD-10-CM | POA: Diagnosis not present

## 2019-12-25 DIAGNOSIS — N186 End stage renal disease: Secondary | ICD-10-CM | POA: Diagnosis not present

## 2019-12-25 DIAGNOSIS — N2581 Secondary hyperparathyroidism of renal origin: Secondary | ICD-10-CM | POA: Diagnosis not present

## 2019-12-25 DIAGNOSIS — D631 Anemia in chronic kidney disease: Secondary | ICD-10-CM | POA: Diagnosis not present

## 2019-12-27 DIAGNOSIS — I129 Hypertensive chronic kidney disease with stage 1 through stage 4 chronic kidney disease, or unspecified chronic kidney disease: Secondary | ICD-10-CM | POA: Diagnosis not present

## 2019-12-27 DIAGNOSIS — N186 End stage renal disease: Secondary | ICD-10-CM | POA: Diagnosis not present

## 2019-12-27 DIAGNOSIS — Z992 Dependence on renal dialysis: Secondary | ICD-10-CM | POA: Diagnosis not present

## 2019-12-28 DIAGNOSIS — N186 End stage renal disease: Secondary | ICD-10-CM | POA: Diagnosis not present

## 2019-12-28 DIAGNOSIS — Z23 Encounter for immunization: Secondary | ICD-10-CM | POA: Diagnosis not present

## 2019-12-28 DIAGNOSIS — Z992 Dependence on renal dialysis: Secondary | ICD-10-CM | POA: Diagnosis not present

## 2019-12-28 DIAGNOSIS — D509 Iron deficiency anemia, unspecified: Secondary | ICD-10-CM | POA: Diagnosis not present

## 2019-12-28 DIAGNOSIS — D631 Anemia in chronic kidney disease: Secondary | ICD-10-CM | POA: Diagnosis not present

## 2019-12-28 DIAGNOSIS — E8770 Fluid overload, unspecified: Secondary | ICD-10-CM | POA: Diagnosis not present

## 2019-12-28 DIAGNOSIS — N2581 Secondary hyperparathyroidism of renal origin: Secondary | ICD-10-CM | POA: Diagnosis not present

## 2019-12-30 DIAGNOSIS — E8770 Fluid overload, unspecified: Secondary | ICD-10-CM | POA: Diagnosis not present

## 2019-12-30 DIAGNOSIS — N186 End stage renal disease: Secondary | ICD-10-CM | POA: Diagnosis not present

## 2019-12-30 DIAGNOSIS — D509 Iron deficiency anemia, unspecified: Secondary | ICD-10-CM | POA: Diagnosis not present

## 2019-12-30 DIAGNOSIS — Z992 Dependence on renal dialysis: Secondary | ICD-10-CM | POA: Diagnosis not present

## 2019-12-30 DIAGNOSIS — Z23 Encounter for immunization: Secondary | ICD-10-CM | POA: Diagnosis not present

## 2019-12-30 DIAGNOSIS — D631 Anemia in chronic kidney disease: Secondary | ICD-10-CM | POA: Diagnosis not present

## 2019-12-30 DIAGNOSIS — N2581 Secondary hyperparathyroidism of renal origin: Secondary | ICD-10-CM | POA: Diagnosis not present

## 2020-01-01 DIAGNOSIS — N2581 Secondary hyperparathyroidism of renal origin: Secondary | ICD-10-CM | POA: Diagnosis not present

## 2020-01-01 DIAGNOSIS — E8770 Fluid overload, unspecified: Secondary | ICD-10-CM | POA: Diagnosis not present

## 2020-01-01 DIAGNOSIS — D509 Iron deficiency anemia, unspecified: Secondary | ICD-10-CM | POA: Diagnosis not present

## 2020-01-01 DIAGNOSIS — D631 Anemia in chronic kidney disease: Secondary | ICD-10-CM | POA: Diagnosis not present

## 2020-01-01 DIAGNOSIS — N186 End stage renal disease: Secondary | ICD-10-CM | POA: Diagnosis not present

## 2020-01-01 DIAGNOSIS — Z23 Encounter for immunization: Secondary | ICD-10-CM | POA: Diagnosis not present

## 2020-01-01 DIAGNOSIS — Z992 Dependence on renal dialysis: Secondary | ICD-10-CM | POA: Diagnosis not present

## 2020-01-03 DIAGNOSIS — E8770 Fluid overload, unspecified: Secondary | ICD-10-CM | POA: Diagnosis not present

## 2020-01-03 DIAGNOSIS — D509 Iron deficiency anemia, unspecified: Secondary | ICD-10-CM | POA: Diagnosis not present

## 2020-01-03 DIAGNOSIS — N186 End stage renal disease: Secondary | ICD-10-CM | POA: Diagnosis not present

## 2020-01-03 DIAGNOSIS — Z23 Encounter for immunization: Secondary | ICD-10-CM | POA: Diagnosis not present

## 2020-01-03 DIAGNOSIS — Z992 Dependence on renal dialysis: Secondary | ICD-10-CM | POA: Diagnosis not present

## 2020-01-03 DIAGNOSIS — D631 Anemia in chronic kidney disease: Secondary | ICD-10-CM | POA: Diagnosis not present

## 2020-01-03 DIAGNOSIS — N2581 Secondary hyperparathyroidism of renal origin: Secondary | ICD-10-CM | POA: Diagnosis not present

## 2020-01-04 DIAGNOSIS — D631 Anemia in chronic kidney disease: Secondary | ICD-10-CM | POA: Diagnosis not present

## 2020-01-04 DIAGNOSIS — D509 Iron deficiency anemia, unspecified: Secondary | ICD-10-CM | POA: Diagnosis not present

## 2020-01-04 DIAGNOSIS — E8770 Fluid overload, unspecified: Secondary | ICD-10-CM | POA: Diagnosis not present

## 2020-01-04 DIAGNOSIS — N2581 Secondary hyperparathyroidism of renal origin: Secondary | ICD-10-CM | POA: Diagnosis not present

## 2020-01-04 DIAGNOSIS — Z992 Dependence on renal dialysis: Secondary | ICD-10-CM | POA: Diagnosis not present

## 2020-01-04 DIAGNOSIS — Z23 Encounter for immunization: Secondary | ICD-10-CM | POA: Diagnosis not present

## 2020-01-04 DIAGNOSIS — N186 End stage renal disease: Secondary | ICD-10-CM | POA: Diagnosis not present

## 2020-01-06 DIAGNOSIS — E8770 Fluid overload, unspecified: Secondary | ICD-10-CM | POA: Diagnosis not present

## 2020-01-06 DIAGNOSIS — Z23 Encounter for immunization: Secondary | ICD-10-CM | POA: Diagnosis not present

## 2020-01-06 DIAGNOSIS — D509 Iron deficiency anemia, unspecified: Secondary | ICD-10-CM | POA: Diagnosis not present

## 2020-01-06 DIAGNOSIS — Z992 Dependence on renal dialysis: Secondary | ICD-10-CM | POA: Diagnosis not present

## 2020-01-06 DIAGNOSIS — N2581 Secondary hyperparathyroidism of renal origin: Secondary | ICD-10-CM | POA: Diagnosis not present

## 2020-01-06 DIAGNOSIS — N186 End stage renal disease: Secondary | ICD-10-CM | POA: Diagnosis not present

## 2020-01-06 DIAGNOSIS — D631 Anemia in chronic kidney disease: Secondary | ICD-10-CM | POA: Diagnosis not present

## 2020-01-08 DIAGNOSIS — Z23 Encounter for immunization: Secondary | ICD-10-CM | POA: Diagnosis not present

## 2020-01-08 DIAGNOSIS — E8770 Fluid overload, unspecified: Secondary | ICD-10-CM | POA: Diagnosis not present

## 2020-01-08 DIAGNOSIS — Z992 Dependence on renal dialysis: Secondary | ICD-10-CM | POA: Diagnosis not present

## 2020-01-08 DIAGNOSIS — D631 Anemia in chronic kidney disease: Secondary | ICD-10-CM | POA: Diagnosis not present

## 2020-01-08 DIAGNOSIS — D509 Iron deficiency anemia, unspecified: Secondary | ICD-10-CM | POA: Diagnosis not present

## 2020-01-08 DIAGNOSIS — N186 End stage renal disease: Secondary | ICD-10-CM | POA: Diagnosis not present

## 2020-01-08 DIAGNOSIS — N2581 Secondary hyperparathyroidism of renal origin: Secondary | ICD-10-CM | POA: Diagnosis not present

## 2020-01-10 DIAGNOSIS — N186 End stage renal disease: Secondary | ICD-10-CM | POA: Diagnosis not present

## 2020-01-10 DIAGNOSIS — D631 Anemia in chronic kidney disease: Secondary | ICD-10-CM | POA: Diagnosis not present

## 2020-01-10 DIAGNOSIS — N2581 Secondary hyperparathyroidism of renal origin: Secondary | ICD-10-CM | POA: Diagnosis not present

## 2020-01-10 DIAGNOSIS — Z992 Dependence on renal dialysis: Secondary | ICD-10-CM | POA: Diagnosis not present

## 2020-01-10 DIAGNOSIS — Z23 Encounter for immunization: Secondary | ICD-10-CM | POA: Diagnosis not present

## 2020-01-10 DIAGNOSIS — E8770 Fluid overload, unspecified: Secondary | ICD-10-CM | POA: Diagnosis not present

## 2020-01-10 DIAGNOSIS — D509 Iron deficiency anemia, unspecified: Secondary | ICD-10-CM | POA: Diagnosis not present

## 2020-01-13 DIAGNOSIS — Z992 Dependence on renal dialysis: Secondary | ICD-10-CM | POA: Diagnosis not present

## 2020-01-13 DIAGNOSIS — N186 End stage renal disease: Secondary | ICD-10-CM | POA: Diagnosis not present

## 2020-01-13 DIAGNOSIS — Z23 Encounter for immunization: Secondary | ICD-10-CM | POA: Diagnosis not present

## 2020-01-13 DIAGNOSIS — D509 Iron deficiency anemia, unspecified: Secondary | ICD-10-CM | POA: Diagnosis not present

## 2020-01-13 DIAGNOSIS — N2581 Secondary hyperparathyroidism of renal origin: Secondary | ICD-10-CM | POA: Diagnosis not present

## 2020-01-13 DIAGNOSIS — D631 Anemia in chronic kidney disease: Secondary | ICD-10-CM | POA: Diagnosis not present

## 2020-01-13 DIAGNOSIS — E8770 Fluid overload, unspecified: Secondary | ICD-10-CM | POA: Diagnosis not present

## 2020-01-15 DIAGNOSIS — D509 Iron deficiency anemia, unspecified: Secondary | ICD-10-CM | POA: Diagnosis not present

## 2020-01-15 DIAGNOSIS — N2581 Secondary hyperparathyroidism of renal origin: Secondary | ICD-10-CM | POA: Diagnosis not present

## 2020-01-15 DIAGNOSIS — Z23 Encounter for immunization: Secondary | ICD-10-CM | POA: Diagnosis not present

## 2020-01-15 DIAGNOSIS — Z992 Dependence on renal dialysis: Secondary | ICD-10-CM | POA: Diagnosis not present

## 2020-01-15 DIAGNOSIS — D631 Anemia in chronic kidney disease: Secondary | ICD-10-CM | POA: Diagnosis not present

## 2020-01-15 DIAGNOSIS — N186 End stage renal disease: Secondary | ICD-10-CM | POA: Diagnosis not present

## 2020-01-15 DIAGNOSIS — E8770 Fluid overload, unspecified: Secondary | ICD-10-CM | POA: Diagnosis not present

## 2020-01-17 DIAGNOSIS — N186 End stage renal disease: Secondary | ICD-10-CM | POA: Diagnosis not present

## 2020-01-17 DIAGNOSIS — Z992 Dependence on renal dialysis: Secondary | ICD-10-CM | POA: Diagnosis not present

## 2020-01-17 DIAGNOSIS — Z23 Encounter for immunization: Secondary | ICD-10-CM | POA: Diagnosis not present

## 2020-01-17 DIAGNOSIS — N2581 Secondary hyperparathyroidism of renal origin: Secondary | ICD-10-CM | POA: Diagnosis not present

## 2020-01-17 DIAGNOSIS — D631 Anemia in chronic kidney disease: Secondary | ICD-10-CM | POA: Diagnosis not present

## 2020-01-17 DIAGNOSIS — E8770 Fluid overload, unspecified: Secondary | ICD-10-CM | POA: Diagnosis not present

## 2020-01-17 DIAGNOSIS — D509 Iron deficiency anemia, unspecified: Secondary | ICD-10-CM | POA: Diagnosis not present

## 2020-01-20 DIAGNOSIS — D509 Iron deficiency anemia, unspecified: Secondary | ICD-10-CM | POA: Diagnosis not present

## 2020-01-20 DIAGNOSIS — Z992 Dependence on renal dialysis: Secondary | ICD-10-CM | POA: Diagnosis not present

## 2020-01-20 DIAGNOSIS — Z23 Encounter for immunization: Secondary | ICD-10-CM | POA: Diagnosis not present

## 2020-01-20 DIAGNOSIS — N2581 Secondary hyperparathyroidism of renal origin: Secondary | ICD-10-CM | POA: Diagnosis not present

## 2020-01-20 DIAGNOSIS — N186 End stage renal disease: Secondary | ICD-10-CM | POA: Diagnosis not present

## 2020-01-20 DIAGNOSIS — E8770 Fluid overload, unspecified: Secondary | ICD-10-CM | POA: Diagnosis not present

## 2020-01-20 DIAGNOSIS — D631 Anemia in chronic kidney disease: Secondary | ICD-10-CM | POA: Diagnosis not present

## 2020-01-22 DIAGNOSIS — N186 End stage renal disease: Secondary | ICD-10-CM | POA: Diagnosis not present

## 2020-01-22 DIAGNOSIS — D509 Iron deficiency anemia, unspecified: Secondary | ICD-10-CM | POA: Diagnosis not present

## 2020-01-22 DIAGNOSIS — N2581 Secondary hyperparathyroidism of renal origin: Secondary | ICD-10-CM | POA: Diagnosis not present

## 2020-01-22 DIAGNOSIS — D631 Anemia in chronic kidney disease: Secondary | ICD-10-CM | POA: Diagnosis not present

## 2020-01-22 DIAGNOSIS — Z992 Dependence on renal dialysis: Secondary | ICD-10-CM | POA: Diagnosis not present

## 2020-01-22 DIAGNOSIS — Z23 Encounter for immunization: Secondary | ICD-10-CM | POA: Diagnosis not present

## 2020-01-22 DIAGNOSIS — E8770 Fluid overload, unspecified: Secondary | ICD-10-CM | POA: Diagnosis not present

## 2020-01-24 DIAGNOSIS — D509 Iron deficiency anemia, unspecified: Secondary | ICD-10-CM | POA: Diagnosis not present

## 2020-01-24 DIAGNOSIS — E8770 Fluid overload, unspecified: Secondary | ICD-10-CM | POA: Diagnosis not present

## 2020-01-24 DIAGNOSIS — Z992 Dependence on renal dialysis: Secondary | ICD-10-CM | POA: Diagnosis not present

## 2020-01-24 DIAGNOSIS — N186 End stage renal disease: Secondary | ICD-10-CM | POA: Diagnosis not present

## 2020-01-24 DIAGNOSIS — N2581 Secondary hyperparathyroidism of renal origin: Secondary | ICD-10-CM | POA: Diagnosis not present

## 2020-01-24 DIAGNOSIS — D631 Anemia in chronic kidney disease: Secondary | ICD-10-CM | POA: Diagnosis not present

## 2020-01-24 DIAGNOSIS — Z23 Encounter for immunization: Secondary | ICD-10-CM | POA: Diagnosis not present

## 2020-01-27 DIAGNOSIS — N186 End stage renal disease: Secondary | ICD-10-CM | POA: Diagnosis not present

## 2020-01-27 DIAGNOSIS — I129 Hypertensive chronic kidney disease with stage 1 through stage 4 chronic kidney disease, or unspecified chronic kidney disease: Secondary | ICD-10-CM | POA: Diagnosis not present

## 2020-01-27 DIAGNOSIS — N2581 Secondary hyperparathyroidism of renal origin: Secondary | ICD-10-CM | POA: Diagnosis not present

## 2020-01-27 DIAGNOSIS — Z992 Dependence on renal dialysis: Secondary | ICD-10-CM | POA: Diagnosis not present

## 2020-01-27 DIAGNOSIS — D631 Anemia in chronic kidney disease: Secondary | ICD-10-CM | POA: Diagnosis not present

## 2020-01-29 DIAGNOSIS — N2581 Secondary hyperparathyroidism of renal origin: Secondary | ICD-10-CM | POA: Diagnosis not present

## 2020-01-29 DIAGNOSIS — N186 End stage renal disease: Secondary | ICD-10-CM | POA: Diagnosis not present

## 2020-01-29 DIAGNOSIS — Z992 Dependence on renal dialysis: Secondary | ICD-10-CM | POA: Diagnosis not present

## 2020-01-29 DIAGNOSIS — D631 Anemia in chronic kidney disease: Secondary | ICD-10-CM | POA: Diagnosis not present

## 2020-01-31 DIAGNOSIS — D631 Anemia in chronic kidney disease: Secondary | ICD-10-CM | POA: Diagnosis not present

## 2020-01-31 DIAGNOSIS — N2581 Secondary hyperparathyroidism of renal origin: Secondary | ICD-10-CM | POA: Diagnosis not present

## 2020-01-31 DIAGNOSIS — Z992 Dependence on renal dialysis: Secondary | ICD-10-CM | POA: Diagnosis not present

## 2020-01-31 DIAGNOSIS — N186 End stage renal disease: Secondary | ICD-10-CM | POA: Diagnosis not present

## 2020-02-03 DIAGNOSIS — Z992 Dependence on renal dialysis: Secondary | ICD-10-CM | POA: Diagnosis not present

## 2020-02-03 DIAGNOSIS — D631 Anemia in chronic kidney disease: Secondary | ICD-10-CM | POA: Diagnosis not present

## 2020-02-03 DIAGNOSIS — N2581 Secondary hyperparathyroidism of renal origin: Secondary | ICD-10-CM | POA: Diagnosis not present

## 2020-02-03 DIAGNOSIS — N186 End stage renal disease: Secondary | ICD-10-CM | POA: Diagnosis not present

## 2020-02-05 DIAGNOSIS — Z992 Dependence on renal dialysis: Secondary | ICD-10-CM | POA: Diagnosis not present

## 2020-02-05 DIAGNOSIS — D631 Anemia in chronic kidney disease: Secondary | ICD-10-CM | POA: Diagnosis not present

## 2020-02-05 DIAGNOSIS — N186 End stage renal disease: Secondary | ICD-10-CM | POA: Diagnosis not present

## 2020-02-05 DIAGNOSIS — N2581 Secondary hyperparathyroidism of renal origin: Secondary | ICD-10-CM | POA: Diagnosis not present

## 2020-02-07 DIAGNOSIS — N2581 Secondary hyperparathyroidism of renal origin: Secondary | ICD-10-CM | POA: Diagnosis not present

## 2020-02-07 DIAGNOSIS — D631 Anemia in chronic kidney disease: Secondary | ICD-10-CM | POA: Diagnosis not present

## 2020-02-07 DIAGNOSIS — N186 End stage renal disease: Secondary | ICD-10-CM | POA: Diagnosis not present

## 2020-02-07 DIAGNOSIS — Z992 Dependence on renal dialysis: Secondary | ICD-10-CM | POA: Diagnosis not present

## 2020-02-10 DIAGNOSIS — Z992 Dependence on renal dialysis: Secondary | ICD-10-CM | POA: Diagnosis not present

## 2020-02-10 DIAGNOSIS — N186 End stage renal disease: Secondary | ICD-10-CM | POA: Diagnosis not present

## 2020-02-10 DIAGNOSIS — N2581 Secondary hyperparathyroidism of renal origin: Secondary | ICD-10-CM | POA: Diagnosis not present

## 2020-02-10 DIAGNOSIS — D631 Anemia in chronic kidney disease: Secondary | ICD-10-CM | POA: Diagnosis not present

## 2020-02-11 DIAGNOSIS — I739 Peripheral vascular disease, unspecified: Secondary | ICD-10-CM | POA: Diagnosis not present

## 2020-02-11 DIAGNOSIS — E669 Obesity, unspecified: Secondary | ICD-10-CM | POA: Diagnosis not present

## 2020-02-11 DIAGNOSIS — Z87891 Personal history of nicotine dependence: Secondary | ICD-10-CM | POA: Diagnosis not present

## 2020-02-11 DIAGNOSIS — D638 Anemia in other chronic diseases classified elsewhere: Secondary | ICD-10-CM | POA: Diagnosis not present

## 2020-02-11 DIAGNOSIS — G894 Chronic pain syndrome: Secondary | ICD-10-CM | POA: Diagnosis not present

## 2020-02-11 DIAGNOSIS — I10 Essential (primary) hypertension: Secondary | ICD-10-CM | POA: Diagnosis not present

## 2020-02-11 DIAGNOSIS — B373 Candidiasis of vulva and vagina: Secondary | ICD-10-CM | POA: Diagnosis not present

## 2020-02-11 DIAGNOSIS — N186 End stage renal disease: Secondary | ICD-10-CM | POA: Diagnosis not present

## 2020-02-12 DIAGNOSIS — Z992 Dependence on renal dialysis: Secondary | ICD-10-CM | POA: Diagnosis not present

## 2020-02-12 DIAGNOSIS — N186 End stage renal disease: Secondary | ICD-10-CM | POA: Diagnosis not present

## 2020-02-12 DIAGNOSIS — D631 Anemia in chronic kidney disease: Secondary | ICD-10-CM | POA: Diagnosis not present

## 2020-02-12 DIAGNOSIS — N2581 Secondary hyperparathyroidism of renal origin: Secondary | ICD-10-CM | POA: Diagnosis not present

## 2020-02-14 DIAGNOSIS — N2581 Secondary hyperparathyroidism of renal origin: Secondary | ICD-10-CM | POA: Diagnosis not present

## 2020-02-14 DIAGNOSIS — N186 End stage renal disease: Secondary | ICD-10-CM | POA: Diagnosis not present

## 2020-02-14 DIAGNOSIS — D631 Anemia in chronic kidney disease: Secondary | ICD-10-CM | POA: Diagnosis not present

## 2020-02-14 DIAGNOSIS — Z992 Dependence on renal dialysis: Secondary | ICD-10-CM | POA: Diagnosis not present

## 2020-02-15 DIAGNOSIS — N2581 Secondary hyperparathyroidism of renal origin: Secondary | ICD-10-CM | POA: Diagnosis not present

## 2020-02-15 DIAGNOSIS — Z992 Dependence on renal dialysis: Secondary | ICD-10-CM | POA: Diagnosis not present

## 2020-02-15 DIAGNOSIS — E8770 Fluid overload, unspecified: Secondary | ICD-10-CM | POA: Diagnosis not present

## 2020-02-15 DIAGNOSIS — N186 End stage renal disease: Secondary | ICD-10-CM | POA: Diagnosis not present

## 2020-02-17 DIAGNOSIS — D631 Anemia in chronic kidney disease: Secondary | ICD-10-CM | POA: Diagnosis not present

## 2020-02-17 DIAGNOSIS — N2581 Secondary hyperparathyroidism of renal origin: Secondary | ICD-10-CM | POA: Diagnosis not present

## 2020-02-17 DIAGNOSIS — Z992 Dependence on renal dialysis: Secondary | ICD-10-CM | POA: Diagnosis not present

## 2020-02-17 DIAGNOSIS — N186 End stage renal disease: Secondary | ICD-10-CM | POA: Diagnosis not present

## 2020-02-18 DIAGNOSIS — N2581 Secondary hyperparathyroidism of renal origin: Secondary | ICD-10-CM | POA: Diagnosis not present

## 2020-02-18 DIAGNOSIS — Z992 Dependence on renal dialysis: Secondary | ICD-10-CM | POA: Diagnosis not present

## 2020-02-18 DIAGNOSIS — E8770 Fluid overload, unspecified: Secondary | ICD-10-CM | POA: Diagnosis not present

## 2020-02-18 DIAGNOSIS — N186 End stage renal disease: Secondary | ICD-10-CM | POA: Diagnosis not present

## 2020-02-19 DIAGNOSIS — N186 End stage renal disease: Secondary | ICD-10-CM | POA: Diagnosis not present

## 2020-02-19 DIAGNOSIS — D631 Anemia in chronic kidney disease: Secondary | ICD-10-CM | POA: Diagnosis not present

## 2020-02-19 DIAGNOSIS — N2581 Secondary hyperparathyroidism of renal origin: Secondary | ICD-10-CM | POA: Diagnosis not present

## 2020-02-19 DIAGNOSIS — Z992 Dependence on renal dialysis: Secondary | ICD-10-CM | POA: Diagnosis not present

## 2020-02-21 DIAGNOSIS — D631 Anemia in chronic kidney disease: Secondary | ICD-10-CM | POA: Diagnosis not present

## 2020-02-21 DIAGNOSIS — N186 End stage renal disease: Secondary | ICD-10-CM | POA: Diagnosis not present

## 2020-02-21 DIAGNOSIS — Z992 Dependence on renal dialysis: Secondary | ICD-10-CM | POA: Diagnosis not present

## 2020-02-21 DIAGNOSIS — N2581 Secondary hyperparathyroidism of renal origin: Secondary | ICD-10-CM | POA: Diagnosis not present

## 2020-02-24 DIAGNOSIS — I129 Hypertensive chronic kidney disease with stage 1 through stage 4 chronic kidney disease, or unspecified chronic kidney disease: Secondary | ICD-10-CM | POA: Diagnosis not present

## 2020-02-24 DIAGNOSIS — Z992 Dependence on renal dialysis: Secondary | ICD-10-CM | POA: Diagnosis not present

## 2020-02-24 DIAGNOSIS — N186 End stage renal disease: Secondary | ICD-10-CM | POA: Diagnosis not present

## 2020-02-24 DIAGNOSIS — T782XXS Anaphylactic shock, unspecified, sequela: Secondary | ICD-10-CM | POA: Diagnosis not present

## 2020-02-24 DIAGNOSIS — N2581 Secondary hyperparathyroidism of renal origin: Secondary | ICD-10-CM | POA: Diagnosis not present

## 2020-02-24 DIAGNOSIS — D631 Anemia in chronic kidney disease: Secondary | ICD-10-CM | POA: Diagnosis not present

## 2020-02-26 DIAGNOSIS — T782XXS Anaphylactic shock, unspecified, sequela: Secondary | ICD-10-CM | POA: Diagnosis not present

## 2020-02-26 DIAGNOSIS — D631 Anemia in chronic kidney disease: Secondary | ICD-10-CM | POA: Diagnosis not present

## 2020-02-26 DIAGNOSIS — Z992 Dependence on renal dialysis: Secondary | ICD-10-CM | POA: Diagnosis not present

## 2020-02-26 DIAGNOSIS — N186 End stage renal disease: Secondary | ICD-10-CM | POA: Diagnosis not present

## 2020-02-26 DIAGNOSIS — N2581 Secondary hyperparathyroidism of renal origin: Secondary | ICD-10-CM | POA: Diagnosis not present

## 2020-02-28 DIAGNOSIS — Z992 Dependence on renal dialysis: Secondary | ICD-10-CM | POA: Diagnosis not present

## 2020-02-28 DIAGNOSIS — N2581 Secondary hyperparathyroidism of renal origin: Secondary | ICD-10-CM | POA: Diagnosis not present

## 2020-02-28 DIAGNOSIS — T782XXS Anaphylactic shock, unspecified, sequela: Secondary | ICD-10-CM | POA: Diagnosis not present

## 2020-02-28 DIAGNOSIS — D631 Anemia in chronic kidney disease: Secondary | ICD-10-CM | POA: Diagnosis not present

## 2020-02-28 DIAGNOSIS — N186 End stage renal disease: Secondary | ICD-10-CM | POA: Diagnosis not present

## 2020-03-03 DIAGNOSIS — N186 End stage renal disease: Secondary | ICD-10-CM | POA: Diagnosis not present

## 2020-03-03 DIAGNOSIS — T782XXS Anaphylactic shock, unspecified, sequela: Secondary | ICD-10-CM | POA: Diagnosis not present

## 2020-03-03 DIAGNOSIS — Z992 Dependence on renal dialysis: Secondary | ICD-10-CM | POA: Diagnosis not present

## 2020-03-03 DIAGNOSIS — D631 Anemia in chronic kidney disease: Secondary | ICD-10-CM | POA: Diagnosis not present

## 2020-03-03 DIAGNOSIS — N2581 Secondary hyperparathyroidism of renal origin: Secondary | ICD-10-CM | POA: Diagnosis not present

## 2020-03-04 DIAGNOSIS — T782XXS Anaphylactic shock, unspecified, sequela: Secondary | ICD-10-CM | POA: Diagnosis not present

## 2020-03-04 DIAGNOSIS — N186 End stage renal disease: Secondary | ICD-10-CM | POA: Diagnosis not present

## 2020-03-04 DIAGNOSIS — Z992 Dependence on renal dialysis: Secondary | ICD-10-CM | POA: Diagnosis not present

## 2020-03-04 DIAGNOSIS — D631 Anemia in chronic kidney disease: Secondary | ICD-10-CM | POA: Diagnosis not present

## 2020-03-04 DIAGNOSIS — N2581 Secondary hyperparathyroidism of renal origin: Secondary | ICD-10-CM | POA: Diagnosis not present

## 2020-03-06 DIAGNOSIS — N2581 Secondary hyperparathyroidism of renal origin: Secondary | ICD-10-CM | POA: Diagnosis not present

## 2020-03-06 DIAGNOSIS — Z992 Dependence on renal dialysis: Secondary | ICD-10-CM | POA: Diagnosis not present

## 2020-03-06 DIAGNOSIS — N186 End stage renal disease: Secondary | ICD-10-CM | POA: Diagnosis not present

## 2020-03-06 DIAGNOSIS — T782XXS Anaphylactic shock, unspecified, sequela: Secondary | ICD-10-CM | POA: Diagnosis not present

## 2020-03-06 DIAGNOSIS — D631 Anemia in chronic kidney disease: Secondary | ICD-10-CM | POA: Diagnosis not present

## 2020-03-09 DIAGNOSIS — D631 Anemia in chronic kidney disease: Secondary | ICD-10-CM | POA: Diagnosis not present

## 2020-03-09 DIAGNOSIS — Z992 Dependence on renal dialysis: Secondary | ICD-10-CM | POA: Diagnosis not present

## 2020-03-09 DIAGNOSIS — T782XXS Anaphylactic shock, unspecified, sequela: Secondary | ICD-10-CM | POA: Diagnosis not present

## 2020-03-09 DIAGNOSIS — N2581 Secondary hyperparathyroidism of renal origin: Secondary | ICD-10-CM | POA: Diagnosis not present

## 2020-03-09 DIAGNOSIS — N186 End stage renal disease: Secondary | ICD-10-CM | POA: Diagnosis not present

## 2020-03-11 DIAGNOSIS — N186 End stage renal disease: Secondary | ICD-10-CM | POA: Diagnosis not present

## 2020-03-11 DIAGNOSIS — T782XXS Anaphylactic shock, unspecified, sequela: Secondary | ICD-10-CM | POA: Diagnosis not present

## 2020-03-11 DIAGNOSIS — Z992 Dependence on renal dialysis: Secondary | ICD-10-CM | POA: Diagnosis not present

## 2020-03-11 DIAGNOSIS — D631 Anemia in chronic kidney disease: Secondary | ICD-10-CM | POA: Diagnosis not present

## 2020-03-11 DIAGNOSIS — N2581 Secondary hyperparathyroidism of renal origin: Secondary | ICD-10-CM | POA: Diagnosis not present

## 2020-03-13 DIAGNOSIS — T782XXS Anaphylactic shock, unspecified, sequela: Secondary | ICD-10-CM | POA: Diagnosis not present

## 2020-03-13 DIAGNOSIS — D631 Anemia in chronic kidney disease: Secondary | ICD-10-CM | POA: Diagnosis not present

## 2020-03-13 DIAGNOSIS — Z992 Dependence on renal dialysis: Secondary | ICD-10-CM | POA: Diagnosis not present

## 2020-03-13 DIAGNOSIS — N2581 Secondary hyperparathyroidism of renal origin: Secondary | ICD-10-CM | POA: Diagnosis not present

## 2020-03-13 DIAGNOSIS — N186 End stage renal disease: Secondary | ICD-10-CM | POA: Diagnosis not present

## 2020-03-16 DIAGNOSIS — T782XXS Anaphylactic shock, unspecified, sequela: Secondary | ICD-10-CM | POA: Diagnosis not present

## 2020-03-16 DIAGNOSIS — N186 End stage renal disease: Secondary | ICD-10-CM | POA: Diagnosis not present

## 2020-03-16 DIAGNOSIS — N2581 Secondary hyperparathyroidism of renal origin: Secondary | ICD-10-CM | POA: Diagnosis not present

## 2020-03-16 DIAGNOSIS — Z992 Dependence on renal dialysis: Secondary | ICD-10-CM | POA: Diagnosis not present

## 2020-03-16 DIAGNOSIS — D631 Anemia in chronic kidney disease: Secondary | ICD-10-CM | POA: Diagnosis not present

## 2020-03-18 DIAGNOSIS — T782XXS Anaphylactic shock, unspecified, sequela: Secondary | ICD-10-CM | POA: Diagnosis not present

## 2020-03-18 DIAGNOSIS — N186 End stage renal disease: Secondary | ICD-10-CM | POA: Diagnosis not present

## 2020-03-18 DIAGNOSIS — N2581 Secondary hyperparathyroidism of renal origin: Secondary | ICD-10-CM | POA: Diagnosis not present

## 2020-03-18 DIAGNOSIS — Z992 Dependence on renal dialysis: Secondary | ICD-10-CM | POA: Diagnosis not present

## 2020-03-18 DIAGNOSIS — D631 Anemia in chronic kidney disease: Secondary | ICD-10-CM | POA: Diagnosis not present

## 2020-03-20 DIAGNOSIS — Z992 Dependence on renal dialysis: Secondary | ICD-10-CM | POA: Diagnosis not present

## 2020-03-20 DIAGNOSIS — D631 Anemia in chronic kidney disease: Secondary | ICD-10-CM | POA: Diagnosis not present

## 2020-03-20 DIAGNOSIS — N186 End stage renal disease: Secondary | ICD-10-CM | POA: Diagnosis not present

## 2020-03-20 DIAGNOSIS — N2581 Secondary hyperparathyroidism of renal origin: Secondary | ICD-10-CM | POA: Diagnosis not present

## 2020-03-20 DIAGNOSIS — T782XXS Anaphylactic shock, unspecified, sequela: Secondary | ICD-10-CM | POA: Diagnosis not present

## 2020-03-23 DIAGNOSIS — D631 Anemia in chronic kidney disease: Secondary | ICD-10-CM | POA: Diagnosis not present

## 2020-03-23 DIAGNOSIS — N2581 Secondary hyperparathyroidism of renal origin: Secondary | ICD-10-CM | POA: Diagnosis not present

## 2020-03-23 DIAGNOSIS — N186 End stage renal disease: Secondary | ICD-10-CM | POA: Diagnosis not present

## 2020-03-23 DIAGNOSIS — T782XXS Anaphylactic shock, unspecified, sequela: Secondary | ICD-10-CM | POA: Diagnosis not present

## 2020-03-23 DIAGNOSIS — Z992 Dependence on renal dialysis: Secondary | ICD-10-CM | POA: Diagnosis not present

## 2020-03-25 DIAGNOSIS — T782XXS Anaphylactic shock, unspecified, sequela: Secondary | ICD-10-CM | POA: Diagnosis not present

## 2020-03-25 DIAGNOSIS — D631 Anemia in chronic kidney disease: Secondary | ICD-10-CM | POA: Diagnosis not present

## 2020-03-25 DIAGNOSIS — N2581 Secondary hyperparathyroidism of renal origin: Secondary | ICD-10-CM | POA: Diagnosis not present

## 2020-03-25 DIAGNOSIS — Z992 Dependence on renal dialysis: Secondary | ICD-10-CM | POA: Diagnosis not present

## 2020-03-25 DIAGNOSIS — N186 End stage renal disease: Secondary | ICD-10-CM | POA: Diagnosis not present

## 2020-03-26 DIAGNOSIS — I129 Hypertensive chronic kidney disease with stage 1 through stage 4 chronic kidney disease, or unspecified chronic kidney disease: Secondary | ICD-10-CM | POA: Diagnosis not present

## 2020-03-26 DIAGNOSIS — Z992 Dependence on renal dialysis: Secondary | ICD-10-CM | POA: Diagnosis not present

## 2020-03-26 DIAGNOSIS — N186 End stage renal disease: Secondary | ICD-10-CM | POA: Diagnosis not present

## 2020-03-30 DIAGNOSIS — N186 End stage renal disease: Secondary | ICD-10-CM | POA: Diagnosis not present

## 2020-03-30 DIAGNOSIS — D509 Iron deficiency anemia, unspecified: Secondary | ICD-10-CM | POA: Diagnosis not present

## 2020-03-30 DIAGNOSIS — Z992 Dependence on renal dialysis: Secondary | ICD-10-CM | POA: Diagnosis not present

## 2020-03-30 DIAGNOSIS — N2581 Secondary hyperparathyroidism of renal origin: Secondary | ICD-10-CM | POA: Diagnosis not present

## 2020-03-30 DIAGNOSIS — D631 Anemia in chronic kidney disease: Secondary | ICD-10-CM | POA: Diagnosis not present

## 2020-04-01 DIAGNOSIS — N186 End stage renal disease: Secondary | ICD-10-CM | POA: Diagnosis not present

## 2020-04-01 DIAGNOSIS — N2581 Secondary hyperparathyroidism of renal origin: Secondary | ICD-10-CM | POA: Diagnosis not present

## 2020-04-01 DIAGNOSIS — D509 Iron deficiency anemia, unspecified: Secondary | ICD-10-CM | POA: Diagnosis not present

## 2020-04-01 DIAGNOSIS — D631 Anemia in chronic kidney disease: Secondary | ICD-10-CM | POA: Diagnosis not present

## 2020-04-01 DIAGNOSIS — Z992 Dependence on renal dialysis: Secondary | ICD-10-CM | POA: Diagnosis not present

## 2020-04-03 DIAGNOSIS — D631 Anemia in chronic kidney disease: Secondary | ICD-10-CM | POA: Diagnosis not present

## 2020-04-03 DIAGNOSIS — D509 Iron deficiency anemia, unspecified: Secondary | ICD-10-CM | POA: Diagnosis not present

## 2020-04-03 DIAGNOSIS — Z992 Dependence on renal dialysis: Secondary | ICD-10-CM | POA: Diagnosis not present

## 2020-04-03 DIAGNOSIS — N186 End stage renal disease: Secondary | ICD-10-CM | POA: Diagnosis not present

## 2020-04-03 DIAGNOSIS — N2581 Secondary hyperparathyroidism of renal origin: Secondary | ICD-10-CM | POA: Diagnosis not present

## 2020-04-06 DIAGNOSIS — N186 End stage renal disease: Secondary | ICD-10-CM | POA: Diagnosis not present

## 2020-04-06 DIAGNOSIS — D509 Iron deficiency anemia, unspecified: Secondary | ICD-10-CM | POA: Diagnosis not present

## 2020-04-06 DIAGNOSIS — N2581 Secondary hyperparathyroidism of renal origin: Secondary | ICD-10-CM | POA: Diagnosis not present

## 2020-04-06 DIAGNOSIS — Z992 Dependence on renal dialysis: Secondary | ICD-10-CM | POA: Diagnosis not present

## 2020-04-06 DIAGNOSIS — D631 Anemia in chronic kidney disease: Secondary | ICD-10-CM | POA: Diagnosis not present

## 2020-04-08 DIAGNOSIS — N2581 Secondary hyperparathyroidism of renal origin: Secondary | ICD-10-CM | POA: Diagnosis not present

## 2020-04-08 DIAGNOSIS — N186 End stage renal disease: Secondary | ICD-10-CM | POA: Diagnosis not present

## 2020-04-08 DIAGNOSIS — D509 Iron deficiency anemia, unspecified: Secondary | ICD-10-CM | POA: Diagnosis not present

## 2020-04-08 DIAGNOSIS — Z992 Dependence on renal dialysis: Secondary | ICD-10-CM | POA: Diagnosis not present

## 2020-04-08 DIAGNOSIS — D631 Anemia in chronic kidney disease: Secondary | ICD-10-CM | POA: Diagnosis not present

## 2020-04-10 DIAGNOSIS — Z992 Dependence on renal dialysis: Secondary | ICD-10-CM | POA: Diagnosis not present

## 2020-04-10 DIAGNOSIS — Z87891 Personal history of nicotine dependence: Secondary | ICD-10-CM | POA: Diagnosis not present

## 2020-04-10 DIAGNOSIS — D631 Anemia in chronic kidney disease: Secondary | ICD-10-CM | POA: Diagnosis not present

## 2020-04-10 DIAGNOSIS — I10 Essential (primary) hypertension: Secondary | ICD-10-CM | POA: Diagnosis not present

## 2020-04-10 DIAGNOSIS — N186 End stage renal disease: Secondary | ICD-10-CM | POA: Diagnosis not present

## 2020-04-10 DIAGNOSIS — N2581 Secondary hyperparathyroidism of renal origin: Secondary | ICD-10-CM | POA: Diagnosis not present

## 2020-04-10 DIAGNOSIS — D638 Anemia in other chronic diseases classified elsewhere: Secondary | ICD-10-CM | POA: Diagnosis not present

## 2020-04-10 DIAGNOSIS — E669 Obesity, unspecified: Secondary | ICD-10-CM | POA: Diagnosis not present

## 2020-04-10 DIAGNOSIS — I739 Peripheral vascular disease, unspecified: Secondary | ICD-10-CM | POA: Diagnosis not present

## 2020-04-10 DIAGNOSIS — Z124 Encounter for screening for malignant neoplasm of cervix: Secondary | ICD-10-CM | POA: Diagnosis not present

## 2020-04-10 DIAGNOSIS — D509 Iron deficiency anemia, unspecified: Secondary | ICD-10-CM | POA: Diagnosis not present

## 2020-04-10 DIAGNOSIS — G894 Chronic pain syndrome: Secondary | ICD-10-CM | POA: Diagnosis not present

## 2020-04-13 DIAGNOSIS — D509 Iron deficiency anemia, unspecified: Secondary | ICD-10-CM | POA: Diagnosis not present

## 2020-04-13 DIAGNOSIS — N2581 Secondary hyperparathyroidism of renal origin: Secondary | ICD-10-CM | POA: Diagnosis not present

## 2020-04-13 DIAGNOSIS — Z992 Dependence on renal dialysis: Secondary | ICD-10-CM | POA: Diagnosis not present

## 2020-04-13 DIAGNOSIS — N186 End stage renal disease: Secondary | ICD-10-CM | POA: Diagnosis not present

## 2020-04-13 DIAGNOSIS — D631 Anemia in chronic kidney disease: Secondary | ICD-10-CM | POA: Diagnosis not present

## 2020-04-15 DIAGNOSIS — N2581 Secondary hyperparathyroidism of renal origin: Secondary | ICD-10-CM | POA: Diagnosis not present

## 2020-04-15 DIAGNOSIS — N186 End stage renal disease: Secondary | ICD-10-CM | POA: Diagnosis not present

## 2020-04-15 DIAGNOSIS — Z992 Dependence on renal dialysis: Secondary | ICD-10-CM | POA: Diagnosis not present

## 2020-04-15 DIAGNOSIS — D631 Anemia in chronic kidney disease: Secondary | ICD-10-CM | POA: Diagnosis not present

## 2020-04-15 DIAGNOSIS — D509 Iron deficiency anemia, unspecified: Secondary | ICD-10-CM | POA: Diagnosis not present

## 2020-04-17 ENCOUNTER — Other Ambulatory Visit: Payer: Self-pay | Admitting: Internal Medicine

## 2020-04-17 DIAGNOSIS — N2581 Secondary hyperparathyroidism of renal origin: Secondary | ICD-10-CM | POA: Diagnosis not present

## 2020-04-17 DIAGNOSIS — Z992 Dependence on renal dialysis: Secondary | ICD-10-CM | POA: Diagnosis not present

## 2020-04-17 DIAGNOSIS — D631 Anemia in chronic kidney disease: Secondary | ICD-10-CM | POA: Diagnosis not present

## 2020-04-17 DIAGNOSIS — Z1231 Encounter for screening mammogram for malignant neoplasm of breast: Secondary | ICD-10-CM

## 2020-04-17 DIAGNOSIS — N186 End stage renal disease: Secondary | ICD-10-CM | POA: Diagnosis not present

## 2020-04-17 DIAGNOSIS — D509 Iron deficiency anemia, unspecified: Secondary | ICD-10-CM | POA: Diagnosis not present

## 2020-04-20 DIAGNOSIS — D509 Iron deficiency anemia, unspecified: Secondary | ICD-10-CM | POA: Diagnosis not present

## 2020-04-20 DIAGNOSIS — N2581 Secondary hyperparathyroidism of renal origin: Secondary | ICD-10-CM | POA: Diagnosis not present

## 2020-04-20 DIAGNOSIS — N186 End stage renal disease: Secondary | ICD-10-CM | POA: Diagnosis not present

## 2020-04-20 DIAGNOSIS — Z992 Dependence on renal dialysis: Secondary | ICD-10-CM | POA: Diagnosis not present

## 2020-04-20 DIAGNOSIS — D631 Anemia in chronic kidney disease: Secondary | ICD-10-CM | POA: Diagnosis not present

## 2020-04-22 DIAGNOSIS — Z992 Dependence on renal dialysis: Secondary | ICD-10-CM | POA: Diagnosis not present

## 2020-04-22 DIAGNOSIS — N2581 Secondary hyperparathyroidism of renal origin: Secondary | ICD-10-CM | POA: Diagnosis not present

## 2020-04-22 DIAGNOSIS — D631 Anemia in chronic kidney disease: Secondary | ICD-10-CM | POA: Diagnosis not present

## 2020-04-22 DIAGNOSIS — D509 Iron deficiency anemia, unspecified: Secondary | ICD-10-CM | POA: Diagnosis not present

## 2020-04-22 DIAGNOSIS — N186 End stage renal disease: Secondary | ICD-10-CM | POA: Diagnosis not present

## 2020-04-24 DIAGNOSIS — D509 Iron deficiency anemia, unspecified: Secondary | ICD-10-CM | POA: Diagnosis not present

## 2020-04-24 DIAGNOSIS — N186 End stage renal disease: Secondary | ICD-10-CM | POA: Diagnosis not present

## 2020-04-24 DIAGNOSIS — N2581 Secondary hyperparathyroidism of renal origin: Secondary | ICD-10-CM | POA: Diagnosis not present

## 2020-04-24 DIAGNOSIS — D631 Anemia in chronic kidney disease: Secondary | ICD-10-CM | POA: Diagnosis not present

## 2020-04-24 DIAGNOSIS — Z992 Dependence on renal dialysis: Secondary | ICD-10-CM | POA: Diagnosis not present

## 2020-04-25 DIAGNOSIS — Z992 Dependence on renal dialysis: Secondary | ICD-10-CM | POA: Diagnosis not present

## 2020-04-25 DIAGNOSIS — N186 End stage renal disease: Secondary | ICD-10-CM | POA: Diagnosis not present

## 2020-04-25 DIAGNOSIS — I129 Hypertensive chronic kidney disease with stage 1 through stage 4 chronic kidney disease, or unspecified chronic kidney disease: Secondary | ICD-10-CM | POA: Diagnosis not present

## 2020-04-27 DIAGNOSIS — N186 End stage renal disease: Secondary | ICD-10-CM | POA: Diagnosis not present

## 2020-04-27 DIAGNOSIS — Z992 Dependence on renal dialysis: Secondary | ICD-10-CM | POA: Diagnosis not present

## 2020-04-27 DIAGNOSIS — D631 Anemia in chronic kidney disease: Secondary | ICD-10-CM | POA: Diagnosis not present

## 2020-04-27 DIAGNOSIS — T782XXS Anaphylactic shock, unspecified, sequela: Secondary | ICD-10-CM | POA: Diagnosis not present

## 2020-04-27 DIAGNOSIS — N2581 Secondary hyperparathyroidism of renal origin: Secondary | ICD-10-CM | POA: Diagnosis not present

## 2020-04-28 DIAGNOSIS — Z992 Dependence on renal dialysis: Secondary | ICD-10-CM | POA: Diagnosis not present

## 2020-04-28 DIAGNOSIS — N186 End stage renal disease: Secondary | ICD-10-CM | POA: Diagnosis not present

## 2020-04-28 DIAGNOSIS — E8770 Fluid overload, unspecified: Secondary | ICD-10-CM | POA: Diagnosis not present

## 2020-04-28 DIAGNOSIS — N2581 Secondary hyperparathyroidism of renal origin: Secondary | ICD-10-CM | POA: Diagnosis not present

## 2020-04-29 DIAGNOSIS — T782XXS Anaphylactic shock, unspecified, sequela: Secondary | ICD-10-CM | POA: Diagnosis not present

## 2020-04-29 DIAGNOSIS — Z992 Dependence on renal dialysis: Secondary | ICD-10-CM | POA: Diagnosis not present

## 2020-04-29 DIAGNOSIS — N186 End stage renal disease: Secondary | ICD-10-CM | POA: Diagnosis not present

## 2020-04-29 DIAGNOSIS — N2581 Secondary hyperparathyroidism of renal origin: Secondary | ICD-10-CM | POA: Diagnosis not present

## 2020-04-29 DIAGNOSIS — D631 Anemia in chronic kidney disease: Secondary | ICD-10-CM | POA: Diagnosis not present

## 2020-05-01 DIAGNOSIS — N186 End stage renal disease: Secondary | ICD-10-CM | POA: Diagnosis not present

## 2020-05-01 DIAGNOSIS — T782XXS Anaphylactic shock, unspecified, sequela: Secondary | ICD-10-CM | POA: Diagnosis not present

## 2020-05-01 DIAGNOSIS — D631 Anemia in chronic kidney disease: Secondary | ICD-10-CM | POA: Diagnosis not present

## 2020-05-01 DIAGNOSIS — Z992 Dependence on renal dialysis: Secondary | ICD-10-CM | POA: Diagnosis not present

## 2020-05-01 DIAGNOSIS — N2581 Secondary hyperparathyroidism of renal origin: Secondary | ICD-10-CM | POA: Diagnosis not present

## 2020-05-04 DIAGNOSIS — Z992 Dependence on renal dialysis: Secondary | ICD-10-CM | POA: Diagnosis not present

## 2020-05-04 DIAGNOSIS — D631 Anemia in chronic kidney disease: Secondary | ICD-10-CM | POA: Diagnosis not present

## 2020-05-04 DIAGNOSIS — T782XXS Anaphylactic shock, unspecified, sequela: Secondary | ICD-10-CM | POA: Diagnosis not present

## 2020-05-04 DIAGNOSIS — N186 End stage renal disease: Secondary | ICD-10-CM | POA: Diagnosis not present

## 2020-05-04 DIAGNOSIS — N2581 Secondary hyperparathyroidism of renal origin: Secondary | ICD-10-CM | POA: Diagnosis not present

## 2020-05-06 DIAGNOSIS — N2581 Secondary hyperparathyroidism of renal origin: Secondary | ICD-10-CM | POA: Diagnosis not present

## 2020-05-06 DIAGNOSIS — D631 Anemia in chronic kidney disease: Secondary | ICD-10-CM | POA: Diagnosis not present

## 2020-05-06 DIAGNOSIS — T782XXS Anaphylactic shock, unspecified, sequela: Secondary | ICD-10-CM | POA: Diagnosis not present

## 2020-05-06 DIAGNOSIS — Z992 Dependence on renal dialysis: Secondary | ICD-10-CM | POA: Diagnosis not present

## 2020-05-06 DIAGNOSIS — N186 End stage renal disease: Secondary | ICD-10-CM | POA: Diagnosis not present

## 2020-05-08 DIAGNOSIS — T782XXS Anaphylactic shock, unspecified, sequela: Secondary | ICD-10-CM | POA: Diagnosis not present

## 2020-05-08 DIAGNOSIS — Z992 Dependence on renal dialysis: Secondary | ICD-10-CM | POA: Diagnosis not present

## 2020-05-08 DIAGNOSIS — D631 Anemia in chronic kidney disease: Secondary | ICD-10-CM | POA: Diagnosis not present

## 2020-05-08 DIAGNOSIS — N186 End stage renal disease: Secondary | ICD-10-CM | POA: Diagnosis not present

## 2020-05-08 DIAGNOSIS — N2581 Secondary hyperparathyroidism of renal origin: Secondary | ICD-10-CM | POA: Diagnosis not present

## 2020-05-11 DIAGNOSIS — D631 Anemia in chronic kidney disease: Secondary | ICD-10-CM | POA: Diagnosis not present

## 2020-05-11 DIAGNOSIS — Z992 Dependence on renal dialysis: Secondary | ICD-10-CM | POA: Diagnosis not present

## 2020-05-11 DIAGNOSIS — T782XXS Anaphylactic shock, unspecified, sequela: Secondary | ICD-10-CM | POA: Diagnosis not present

## 2020-05-11 DIAGNOSIS — N2581 Secondary hyperparathyroidism of renal origin: Secondary | ICD-10-CM | POA: Diagnosis not present

## 2020-05-11 DIAGNOSIS — N186 End stage renal disease: Secondary | ICD-10-CM | POA: Diagnosis not present

## 2020-05-13 DIAGNOSIS — D631 Anemia in chronic kidney disease: Secondary | ICD-10-CM | POA: Diagnosis not present

## 2020-05-13 DIAGNOSIS — Z992 Dependence on renal dialysis: Secondary | ICD-10-CM | POA: Diagnosis not present

## 2020-05-13 DIAGNOSIS — N186 End stage renal disease: Secondary | ICD-10-CM | POA: Diagnosis not present

## 2020-05-13 DIAGNOSIS — T782XXS Anaphylactic shock, unspecified, sequela: Secondary | ICD-10-CM | POA: Diagnosis not present

## 2020-05-13 DIAGNOSIS — N2581 Secondary hyperparathyroidism of renal origin: Secondary | ICD-10-CM | POA: Diagnosis not present

## 2020-05-15 DIAGNOSIS — N2581 Secondary hyperparathyroidism of renal origin: Secondary | ICD-10-CM | POA: Diagnosis not present

## 2020-05-15 DIAGNOSIS — N186 End stage renal disease: Secondary | ICD-10-CM | POA: Diagnosis not present

## 2020-05-15 DIAGNOSIS — T782XXS Anaphylactic shock, unspecified, sequela: Secondary | ICD-10-CM | POA: Diagnosis not present

## 2020-05-15 DIAGNOSIS — Z992 Dependence on renal dialysis: Secondary | ICD-10-CM | POA: Diagnosis not present

## 2020-05-15 DIAGNOSIS — D631 Anemia in chronic kidney disease: Secondary | ICD-10-CM | POA: Diagnosis not present

## 2020-05-18 DIAGNOSIS — N2581 Secondary hyperparathyroidism of renal origin: Secondary | ICD-10-CM | POA: Diagnosis not present

## 2020-05-18 DIAGNOSIS — T782XXS Anaphylactic shock, unspecified, sequela: Secondary | ICD-10-CM | POA: Diagnosis not present

## 2020-05-18 DIAGNOSIS — N186 End stage renal disease: Secondary | ICD-10-CM | POA: Diagnosis not present

## 2020-05-18 DIAGNOSIS — Z992 Dependence on renal dialysis: Secondary | ICD-10-CM | POA: Diagnosis not present

## 2020-05-18 DIAGNOSIS — D631 Anemia in chronic kidney disease: Secondary | ICD-10-CM | POA: Diagnosis not present

## 2020-05-20 DIAGNOSIS — N2581 Secondary hyperparathyroidism of renal origin: Secondary | ICD-10-CM | POA: Diagnosis not present

## 2020-05-20 DIAGNOSIS — T782XXS Anaphylactic shock, unspecified, sequela: Secondary | ICD-10-CM | POA: Diagnosis not present

## 2020-05-20 DIAGNOSIS — D631 Anemia in chronic kidney disease: Secondary | ICD-10-CM | POA: Diagnosis not present

## 2020-05-20 DIAGNOSIS — N186 End stage renal disease: Secondary | ICD-10-CM | POA: Diagnosis not present

## 2020-05-20 DIAGNOSIS — Z992 Dependence on renal dialysis: Secondary | ICD-10-CM | POA: Diagnosis not present

## 2020-05-22 DIAGNOSIS — N2581 Secondary hyperparathyroidism of renal origin: Secondary | ICD-10-CM | POA: Diagnosis not present

## 2020-05-22 DIAGNOSIS — N186 End stage renal disease: Secondary | ICD-10-CM | POA: Diagnosis not present

## 2020-05-22 DIAGNOSIS — D631 Anemia in chronic kidney disease: Secondary | ICD-10-CM | POA: Diagnosis not present

## 2020-05-22 DIAGNOSIS — Z992 Dependence on renal dialysis: Secondary | ICD-10-CM | POA: Diagnosis not present

## 2020-05-22 DIAGNOSIS — T782XXS Anaphylactic shock, unspecified, sequela: Secondary | ICD-10-CM | POA: Diagnosis not present

## 2020-05-25 DIAGNOSIS — N2581 Secondary hyperparathyroidism of renal origin: Secondary | ICD-10-CM | POA: Diagnosis not present

## 2020-05-25 DIAGNOSIS — N186 End stage renal disease: Secondary | ICD-10-CM | POA: Diagnosis not present

## 2020-05-25 DIAGNOSIS — D631 Anemia in chronic kidney disease: Secondary | ICD-10-CM | POA: Diagnosis not present

## 2020-05-25 DIAGNOSIS — Z992 Dependence on renal dialysis: Secondary | ICD-10-CM | POA: Diagnosis not present

## 2020-05-25 DIAGNOSIS — T782XXS Anaphylactic shock, unspecified, sequela: Secondary | ICD-10-CM | POA: Diagnosis not present

## 2020-06-11 DIAGNOSIS — E669 Obesity, unspecified: Secondary | ICD-10-CM | POA: Diagnosis not present

## 2020-06-11 DIAGNOSIS — N186 End stage renal disease: Secondary | ICD-10-CM | POA: Diagnosis not present

## 2020-06-11 DIAGNOSIS — I739 Peripheral vascular disease, unspecified: Secondary | ICD-10-CM | POA: Diagnosis not present

## 2020-06-11 DIAGNOSIS — I10 Essential (primary) hypertension: Secondary | ICD-10-CM | POA: Diagnosis not present

## 2020-06-11 DIAGNOSIS — Z87891 Personal history of nicotine dependence: Secondary | ICD-10-CM | POA: Diagnosis not present

## 2020-06-11 DIAGNOSIS — M79672 Pain in left foot: Secondary | ICD-10-CM | POA: Diagnosis not present

## 2020-06-11 DIAGNOSIS — D638 Anemia in other chronic diseases classified elsewhere: Secondary | ICD-10-CM | POA: Diagnosis not present

## 2020-06-11 DIAGNOSIS — G894 Chronic pain syndrome: Secondary | ICD-10-CM | POA: Diagnosis not present

## 2020-06-25 DIAGNOSIS — N186 End stage renal disease: Secondary | ICD-10-CM | POA: Diagnosis not present

## 2020-06-25 DIAGNOSIS — I129 Hypertensive chronic kidney disease with stage 1 through stage 4 chronic kidney disease, or unspecified chronic kidney disease: Secondary | ICD-10-CM | POA: Diagnosis not present

## 2020-06-25 DIAGNOSIS — Z992 Dependence on renal dialysis: Secondary | ICD-10-CM | POA: Diagnosis not present

## 2020-06-26 DIAGNOSIS — D631 Anemia in chronic kidney disease: Secondary | ICD-10-CM | POA: Diagnosis not present

## 2020-06-26 DIAGNOSIS — T782XXS Anaphylactic shock, unspecified, sequela: Secondary | ICD-10-CM | POA: Diagnosis not present

## 2020-06-26 DIAGNOSIS — D509 Iron deficiency anemia, unspecified: Secondary | ICD-10-CM | POA: Diagnosis not present

## 2020-06-26 DIAGNOSIS — Z992 Dependence on renal dialysis: Secondary | ICD-10-CM | POA: Diagnosis not present

## 2020-06-26 DIAGNOSIS — N2581 Secondary hyperparathyroidism of renal origin: Secondary | ICD-10-CM | POA: Diagnosis not present

## 2020-06-26 DIAGNOSIS — N186 End stage renal disease: Secondary | ICD-10-CM | POA: Diagnosis not present

## 2020-06-29 DIAGNOSIS — D509 Iron deficiency anemia, unspecified: Secondary | ICD-10-CM | POA: Diagnosis not present

## 2020-06-29 DIAGNOSIS — D631 Anemia in chronic kidney disease: Secondary | ICD-10-CM | POA: Diagnosis not present

## 2020-06-29 DIAGNOSIS — Z992 Dependence on renal dialysis: Secondary | ICD-10-CM | POA: Diagnosis not present

## 2020-06-29 DIAGNOSIS — T782XXS Anaphylactic shock, unspecified, sequela: Secondary | ICD-10-CM | POA: Diagnosis not present

## 2020-06-29 DIAGNOSIS — N2581 Secondary hyperparathyroidism of renal origin: Secondary | ICD-10-CM | POA: Diagnosis not present

## 2020-06-29 DIAGNOSIS — N186 End stage renal disease: Secondary | ICD-10-CM | POA: Diagnosis not present

## 2020-07-01 DIAGNOSIS — N2581 Secondary hyperparathyroidism of renal origin: Secondary | ICD-10-CM | POA: Diagnosis not present

## 2020-07-01 DIAGNOSIS — T782XXS Anaphylactic shock, unspecified, sequela: Secondary | ICD-10-CM | POA: Diagnosis not present

## 2020-07-01 DIAGNOSIS — N186 End stage renal disease: Secondary | ICD-10-CM | POA: Diagnosis not present

## 2020-07-01 DIAGNOSIS — D509 Iron deficiency anemia, unspecified: Secondary | ICD-10-CM | POA: Diagnosis not present

## 2020-07-01 DIAGNOSIS — D631 Anemia in chronic kidney disease: Secondary | ICD-10-CM | POA: Diagnosis not present

## 2020-07-01 DIAGNOSIS — Z992 Dependence on renal dialysis: Secondary | ICD-10-CM | POA: Diagnosis not present

## 2020-07-03 DIAGNOSIS — Z992 Dependence on renal dialysis: Secondary | ICD-10-CM | POA: Diagnosis not present

## 2020-07-03 DIAGNOSIS — T782XXS Anaphylactic shock, unspecified, sequela: Secondary | ICD-10-CM | POA: Diagnosis not present

## 2020-07-03 DIAGNOSIS — N186 End stage renal disease: Secondary | ICD-10-CM | POA: Diagnosis not present

## 2020-07-03 DIAGNOSIS — N2581 Secondary hyperparathyroidism of renal origin: Secondary | ICD-10-CM | POA: Diagnosis not present

## 2020-07-03 DIAGNOSIS — D509 Iron deficiency anemia, unspecified: Secondary | ICD-10-CM | POA: Diagnosis not present

## 2020-07-03 DIAGNOSIS — D631 Anemia in chronic kidney disease: Secondary | ICD-10-CM | POA: Diagnosis not present

## 2020-07-06 DIAGNOSIS — D509 Iron deficiency anemia, unspecified: Secondary | ICD-10-CM | POA: Diagnosis not present

## 2020-07-06 DIAGNOSIS — T782XXS Anaphylactic shock, unspecified, sequela: Secondary | ICD-10-CM | POA: Diagnosis not present

## 2020-07-06 DIAGNOSIS — D631 Anemia in chronic kidney disease: Secondary | ICD-10-CM | POA: Diagnosis not present

## 2020-07-06 DIAGNOSIS — N186 End stage renal disease: Secondary | ICD-10-CM | POA: Diagnosis not present

## 2020-07-06 DIAGNOSIS — Z992 Dependence on renal dialysis: Secondary | ICD-10-CM | POA: Diagnosis not present

## 2020-07-06 DIAGNOSIS — N2581 Secondary hyperparathyroidism of renal origin: Secondary | ICD-10-CM | POA: Diagnosis not present

## 2020-07-08 DIAGNOSIS — N186 End stage renal disease: Secondary | ICD-10-CM | POA: Diagnosis not present

## 2020-07-08 DIAGNOSIS — Z992 Dependence on renal dialysis: Secondary | ICD-10-CM | POA: Diagnosis not present

## 2020-07-08 DIAGNOSIS — D631 Anemia in chronic kidney disease: Secondary | ICD-10-CM | POA: Diagnosis not present

## 2020-07-08 DIAGNOSIS — D509 Iron deficiency anemia, unspecified: Secondary | ICD-10-CM | POA: Diagnosis not present

## 2020-07-08 DIAGNOSIS — T782XXS Anaphylactic shock, unspecified, sequela: Secondary | ICD-10-CM | POA: Diagnosis not present

## 2020-07-08 DIAGNOSIS — N2581 Secondary hyperparathyroidism of renal origin: Secondary | ICD-10-CM | POA: Diagnosis not present

## 2020-07-10 DIAGNOSIS — N2581 Secondary hyperparathyroidism of renal origin: Secondary | ICD-10-CM | POA: Diagnosis not present

## 2020-07-10 DIAGNOSIS — D631 Anemia in chronic kidney disease: Secondary | ICD-10-CM | POA: Diagnosis not present

## 2020-07-10 DIAGNOSIS — D509 Iron deficiency anemia, unspecified: Secondary | ICD-10-CM | POA: Diagnosis not present

## 2020-07-10 DIAGNOSIS — T782XXS Anaphylactic shock, unspecified, sequela: Secondary | ICD-10-CM | POA: Diagnosis not present

## 2020-07-10 DIAGNOSIS — Z992 Dependence on renal dialysis: Secondary | ICD-10-CM | POA: Diagnosis not present

## 2020-07-10 DIAGNOSIS — N186 End stage renal disease: Secondary | ICD-10-CM | POA: Diagnosis not present

## 2020-07-13 DIAGNOSIS — D509 Iron deficiency anemia, unspecified: Secondary | ICD-10-CM | POA: Diagnosis not present

## 2020-07-13 DIAGNOSIS — N2581 Secondary hyperparathyroidism of renal origin: Secondary | ICD-10-CM | POA: Diagnosis not present

## 2020-07-13 DIAGNOSIS — Z992 Dependence on renal dialysis: Secondary | ICD-10-CM | POA: Diagnosis not present

## 2020-07-13 DIAGNOSIS — N186 End stage renal disease: Secondary | ICD-10-CM | POA: Diagnosis not present

## 2020-07-13 DIAGNOSIS — T782XXS Anaphylactic shock, unspecified, sequela: Secondary | ICD-10-CM | POA: Diagnosis not present

## 2020-07-13 DIAGNOSIS — D631 Anemia in chronic kidney disease: Secondary | ICD-10-CM | POA: Diagnosis not present

## 2020-07-15 DIAGNOSIS — Z992 Dependence on renal dialysis: Secondary | ICD-10-CM | POA: Diagnosis not present

## 2020-07-15 DIAGNOSIS — T782XXS Anaphylactic shock, unspecified, sequela: Secondary | ICD-10-CM | POA: Diagnosis not present

## 2020-07-15 DIAGNOSIS — N2581 Secondary hyperparathyroidism of renal origin: Secondary | ICD-10-CM | POA: Diagnosis not present

## 2020-07-15 DIAGNOSIS — D631 Anemia in chronic kidney disease: Secondary | ICD-10-CM | POA: Diagnosis not present

## 2020-07-15 DIAGNOSIS — N186 End stage renal disease: Secondary | ICD-10-CM | POA: Diagnosis not present

## 2020-07-15 DIAGNOSIS — D509 Iron deficiency anemia, unspecified: Secondary | ICD-10-CM | POA: Diagnosis not present

## 2020-07-17 DIAGNOSIS — Z992 Dependence on renal dialysis: Secondary | ICD-10-CM | POA: Diagnosis not present

## 2020-07-17 DIAGNOSIS — N2581 Secondary hyperparathyroidism of renal origin: Secondary | ICD-10-CM | POA: Diagnosis not present

## 2020-07-17 DIAGNOSIS — T782XXS Anaphylactic shock, unspecified, sequela: Secondary | ICD-10-CM | POA: Diagnosis not present

## 2020-07-17 DIAGNOSIS — D509 Iron deficiency anemia, unspecified: Secondary | ICD-10-CM | POA: Diagnosis not present

## 2020-07-17 DIAGNOSIS — N186 End stage renal disease: Secondary | ICD-10-CM | POA: Diagnosis not present

## 2020-07-17 DIAGNOSIS — D631 Anemia in chronic kidney disease: Secondary | ICD-10-CM | POA: Diagnosis not present

## 2020-07-20 DIAGNOSIS — N2581 Secondary hyperparathyroidism of renal origin: Secondary | ICD-10-CM | POA: Diagnosis not present

## 2020-07-20 DIAGNOSIS — D631 Anemia in chronic kidney disease: Secondary | ICD-10-CM | POA: Diagnosis not present

## 2020-07-20 DIAGNOSIS — N186 End stage renal disease: Secondary | ICD-10-CM | POA: Diagnosis not present

## 2020-07-20 DIAGNOSIS — D509 Iron deficiency anemia, unspecified: Secondary | ICD-10-CM | POA: Diagnosis not present

## 2020-07-20 DIAGNOSIS — T782XXS Anaphylactic shock, unspecified, sequela: Secondary | ICD-10-CM | POA: Diagnosis not present

## 2020-07-20 DIAGNOSIS — Z992 Dependence on renal dialysis: Secondary | ICD-10-CM | POA: Diagnosis not present

## 2020-07-22 DIAGNOSIS — Z992 Dependence on renal dialysis: Secondary | ICD-10-CM | POA: Diagnosis not present

## 2020-07-22 DIAGNOSIS — D509 Iron deficiency anemia, unspecified: Secondary | ICD-10-CM | POA: Diagnosis not present

## 2020-07-22 DIAGNOSIS — N186 End stage renal disease: Secondary | ICD-10-CM | POA: Diagnosis not present

## 2020-07-22 DIAGNOSIS — D631 Anemia in chronic kidney disease: Secondary | ICD-10-CM | POA: Diagnosis not present

## 2020-07-22 DIAGNOSIS — T782XXS Anaphylactic shock, unspecified, sequela: Secondary | ICD-10-CM | POA: Diagnosis not present

## 2020-07-22 DIAGNOSIS — N2581 Secondary hyperparathyroidism of renal origin: Secondary | ICD-10-CM | POA: Diagnosis not present

## 2020-07-24 DIAGNOSIS — N186 End stage renal disease: Secondary | ICD-10-CM | POA: Diagnosis not present

## 2020-07-24 DIAGNOSIS — D631 Anemia in chronic kidney disease: Secondary | ICD-10-CM | POA: Diagnosis not present

## 2020-07-24 DIAGNOSIS — D509 Iron deficiency anemia, unspecified: Secondary | ICD-10-CM | POA: Diagnosis not present

## 2020-07-24 DIAGNOSIS — Z992 Dependence on renal dialysis: Secondary | ICD-10-CM | POA: Diagnosis not present

## 2020-07-24 DIAGNOSIS — N2581 Secondary hyperparathyroidism of renal origin: Secondary | ICD-10-CM | POA: Diagnosis not present

## 2020-07-24 DIAGNOSIS — T782XXS Anaphylactic shock, unspecified, sequela: Secondary | ICD-10-CM | POA: Diagnosis not present

## 2020-07-26 DIAGNOSIS — Z992 Dependence on renal dialysis: Secondary | ICD-10-CM | POA: Diagnosis not present

## 2020-07-26 DIAGNOSIS — I129 Hypertensive chronic kidney disease with stage 1 through stage 4 chronic kidney disease, or unspecified chronic kidney disease: Secondary | ICD-10-CM | POA: Diagnosis not present

## 2020-07-26 DIAGNOSIS — N186 End stage renal disease: Secondary | ICD-10-CM | POA: Diagnosis not present

## 2020-07-27 DIAGNOSIS — T782XXS Anaphylactic shock, unspecified, sequela: Secondary | ICD-10-CM | POA: Diagnosis not present

## 2020-07-27 DIAGNOSIS — N186 End stage renal disease: Secondary | ICD-10-CM | POA: Diagnosis not present

## 2020-07-27 DIAGNOSIS — D509 Iron deficiency anemia, unspecified: Secondary | ICD-10-CM | POA: Diagnosis not present

## 2020-07-27 DIAGNOSIS — D631 Anemia in chronic kidney disease: Secondary | ICD-10-CM | POA: Diagnosis not present

## 2020-07-27 DIAGNOSIS — N2581 Secondary hyperparathyroidism of renal origin: Secondary | ICD-10-CM | POA: Diagnosis not present

## 2020-07-27 DIAGNOSIS — Z992 Dependence on renal dialysis: Secondary | ICD-10-CM | POA: Diagnosis not present

## 2020-07-29 DIAGNOSIS — D509 Iron deficiency anemia, unspecified: Secondary | ICD-10-CM | POA: Diagnosis not present

## 2020-07-29 DIAGNOSIS — D631 Anemia in chronic kidney disease: Secondary | ICD-10-CM | POA: Diagnosis not present

## 2020-07-29 DIAGNOSIS — N186 End stage renal disease: Secondary | ICD-10-CM | POA: Diagnosis not present

## 2020-07-29 DIAGNOSIS — Z992 Dependence on renal dialysis: Secondary | ICD-10-CM | POA: Diagnosis not present

## 2020-07-29 DIAGNOSIS — T782XXS Anaphylactic shock, unspecified, sequela: Secondary | ICD-10-CM | POA: Diagnosis not present

## 2020-07-29 DIAGNOSIS — N2581 Secondary hyperparathyroidism of renal origin: Secondary | ICD-10-CM | POA: Diagnosis not present

## 2020-07-31 DIAGNOSIS — D631 Anemia in chronic kidney disease: Secondary | ICD-10-CM | POA: Diagnosis not present

## 2020-07-31 DIAGNOSIS — N186 End stage renal disease: Secondary | ICD-10-CM | POA: Diagnosis not present

## 2020-07-31 DIAGNOSIS — Z992 Dependence on renal dialysis: Secondary | ICD-10-CM | POA: Diagnosis not present

## 2020-07-31 DIAGNOSIS — D509 Iron deficiency anemia, unspecified: Secondary | ICD-10-CM | POA: Diagnosis not present

## 2020-07-31 DIAGNOSIS — N2581 Secondary hyperparathyroidism of renal origin: Secondary | ICD-10-CM | POA: Diagnosis not present

## 2020-07-31 DIAGNOSIS — T782XXS Anaphylactic shock, unspecified, sequela: Secondary | ICD-10-CM | POA: Diagnosis not present

## 2020-08-03 DIAGNOSIS — D509 Iron deficiency anemia, unspecified: Secondary | ICD-10-CM | POA: Diagnosis not present

## 2020-08-03 DIAGNOSIS — D631 Anemia in chronic kidney disease: Secondary | ICD-10-CM | POA: Diagnosis not present

## 2020-08-03 DIAGNOSIS — Z992 Dependence on renal dialysis: Secondary | ICD-10-CM | POA: Diagnosis not present

## 2020-08-03 DIAGNOSIS — T782XXS Anaphylactic shock, unspecified, sequela: Secondary | ICD-10-CM | POA: Diagnosis not present

## 2020-08-03 DIAGNOSIS — N2581 Secondary hyperparathyroidism of renal origin: Secondary | ICD-10-CM | POA: Diagnosis not present

## 2020-08-03 DIAGNOSIS — N186 End stage renal disease: Secondary | ICD-10-CM | POA: Diagnosis not present

## 2020-08-05 DIAGNOSIS — D631 Anemia in chronic kidney disease: Secondary | ICD-10-CM | POA: Diagnosis not present

## 2020-08-05 DIAGNOSIS — N2581 Secondary hyperparathyroidism of renal origin: Secondary | ICD-10-CM | POA: Diagnosis not present

## 2020-08-05 DIAGNOSIS — T782XXS Anaphylactic shock, unspecified, sequela: Secondary | ICD-10-CM | POA: Diagnosis not present

## 2020-08-05 DIAGNOSIS — N186 End stage renal disease: Secondary | ICD-10-CM | POA: Diagnosis not present

## 2020-08-05 DIAGNOSIS — D509 Iron deficiency anemia, unspecified: Secondary | ICD-10-CM | POA: Diagnosis not present

## 2020-08-05 DIAGNOSIS — Z992 Dependence on renal dialysis: Secondary | ICD-10-CM | POA: Diagnosis not present

## 2020-08-07 DIAGNOSIS — T782XXS Anaphylactic shock, unspecified, sequela: Secondary | ICD-10-CM | POA: Diagnosis not present

## 2020-08-07 DIAGNOSIS — N2581 Secondary hyperparathyroidism of renal origin: Secondary | ICD-10-CM | POA: Diagnosis not present

## 2020-08-07 DIAGNOSIS — N186 End stage renal disease: Secondary | ICD-10-CM | POA: Diagnosis not present

## 2020-08-07 DIAGNOSIS — D631 Anemia in chronic kidney disease: Secondary | ICD-10-CM | POA: Diagnosis not present

## 2020-08-07 DIAGNOSIS — Z992 Dependence on renal dialysis: Secondary | ICD-10-CM | POA: Diagnosis not present

## 2020-08-07 DIAGNOSIS — D509 Iron deficiency anemia, unspecified: Secondary | ICD-10-CM | POA: Diagnosis not present

## 2020-08-10 DIAGNOSIS — N2581 Secondary hyperparathyroidism of renal origin: Secondary | ICD-10-CM | POA: Diagnosis not present

## 2020-08-10 DIAGNOSIS — T782XXS Anaphylactic shock, unspecified, sequela: Secondary | ICD-10-CM | POA: Diagnosis not present

## 2020-08-10 DIAGNOSIS — N186 End stage renal disease: Secondary | ICD-10-CM | POA: Diagnosis not present

## 2020-08-10 DIAGNOSIS — Z992 Dependence on renal dialysis: Secondary | ICD-10-CM | POA: Diagnosis not present

## 2020-08-10 DIAGNOSIS — D631 Anemia in chronic kidney disease: Secondary | ICD-10-CM | POA: Diagnosis not present

## 2020-08-10 DIAGNOSIS — D509 Iron deficiency anemia, unspecified: Secondary | ICD-10-CM | POA: Diagnosis not present

## 2020-08-12 DIAGNOSIS — D631 Anemia in chronic kidney disease: Secondary | ICD-10-CM | POA: Diagnosis not present

## 2020-08-12 DIAGNOSIS — N186 End stage renal disease: Secondary | ICD-10-CM | POA: Diagnosis not present

## 2020-08-12 DIAGNOSIS — T782XXS Anaphylactic shock, unspecified, sequela: Secondary | ICD-10-CM | POA: Diagnosis not present

## 2020-08-12 DIAGNOSIS — D509 Iron deficiency anemia, unspecified: Secondary | ICD-10-CM | POA: Diagnosis not present

## 2020-08-12 DIAGNOSIS — N2581 Secondary hyperparathyroidism of renal origin: Secondary | ICD-10-CM | POA: Diagnosis not present

## 2020-08-12 DIAGNOSIS — Z992 Dependence on renal dialysis: Secondary | ICD-10-CM | POA: Diagnosis not present

## 2020-08-14 DIAGNOSIS — Z992 Dependence on renal dialysis: Secondary | ICD-10-CM | POA: Diagnosis not present

## 2020-08-14 DIAGNOSIS — T782XXS Anaphylactic shock, unspecified, sequela: Secondary | ICD-10-CM | POA: Diagnosis not present

## 2020-08-14 DIAGNOSIS — D509 Iron deficiency anemia, unspecified: Secondary | ICD-10-CM | POA: Diagnosis not present

## 2020-08-14 DIAGNOSIS — N186 End stage renal disease: Secondary | ICD-10-CM | POA: Diagnosis not present

## 2020-08-14 DIAGNOSIS — N2581 Secondary hyperparathyroidism of renal origin: Secondary | ICD-10-CM | POA: Diagnosis not present

## 2020-08-14 DIAGNOSIS — D631 Anemia in chronic kidney disease: Secondary | ICD-10-CM | POA: Diagnosis not present

## 2020-08-17 DIAGNOSIS — D631 Anemia in chronic kidney disease: Secondary | ICD-10-CM | POA: Diagnosis not present

## 2020-08-17 DIAGNOSIS — D509 Iron deficiency anemia, unspecified: Secondary | ICD-10-CM | POA: Diagnosis not present

## 2020-08-17 DIAGNOSIS — Z992 Dependence on renal dialysis: Secondary | ICD-10-CM | POA: Diagnosis not present

## 2020-08-17 DIAGNOSIS — T782XXS Anaphylactic shock, unspecified, sequela: Secondary | ICD-10-CM | POA: Diagnosis not present

## 2020-08-17 DIAGNOSIS — N2581 Secondary hyperparathyroidism of renal origin: Secondary | ICD-10-CM | POA: Diagnosis not present

## 2020-08-17 DIAGNOSIS — N186 End stage renal disease: Secondary | ICD-10-CM | POA: Diagnosis not present

## 2020-08-18 ENCOUNTER — Emergency Department (HOSPITAL_COMMUNITY): Payer: Medicare Other

## 2020-08-18 ENCOUNTER — Other Ambulatory Visit: Payer: Self-pay

## 2020-08-18 ENCOUNTER — Encounter (HOSPITAL_COMMUNITY): Payer: Self-pay

## 2020-08-18 ENCOUNTER — Emergency Department (HOSPITAL_COMMUNITY)
Admission: EM | Admit: 2020-08-18 | Discharge: 2020-08-18 | Disposition: A | Discharge status: Home / Self Care | Payer: Medicare Other | Attending: Emergency Medicine | Admitting: Emergency Medicine

## 2020-08-18 DIAGNOSIS — Z79899 Other long term (current) drug therapy: Secondary | ICD-10-CM | POA: Insufficient documentation

## 2020-08-18 DIAGNOSIS — I10 Essential (primary) hypertension: Secondary | ICD-10-CM | POA: Diagnosis not present

## 2020-08-18 DIAGNOSIS — M7989 Other specified soft tissue disorders: Secondary | ICD-10-CM | POA: Diagnosis not present

## 2020-08-18 DIAGNOSIS — I7090 Unspecified atherosclerosis: Secondary | ICD-10-CM | POA: Diagnosis not present

## 2020-08-18 DIAGNOSIS — S99911A Unspecified injury of right ankle, initial encounter: Secondary | ICD-10-CM | POA: Diagnosis present

## 2020-08-18 DIAGNOSIS — Y939 Activity, unspecified: Secondary | ICD-10-CM | POA: Insufficient documentation

## 2020-08-18 DIAGNOSIS — R0902 Hypoxemia: Secondary | ICD-10-CM | POA: Diagnosis not present

## 2020-08-18 DIAGNOSIS — N186 End stage renal disease: Secondary | ICD-10-CM | POA: Diagnosis not present

## 2020-08-18 DIAGNOSIS — G4489 Other headache syndrome: Secondary | ICD-10-CM | POA: Diagnosis not present

## 2020-08-18 DIAGNOSIS — I120 Hypertensive chronic kidney disease with stage 5 chronic kidney disease or end stage renal disease: Secondary | ICD-10-CM | POA: Insufficient documentation

## 2020-08-18 DIAGNOSIS — Y92009 Unspecified place in unspecified non-institutional (private) residence as the place of occurrence of the external cause: Secondary | ICD-10-CM | POA: Insufficient documentation

## 2020-08-18 DIAGNOSIS — W130XXA Fall from, out of or through balcony, initial encounter: Secondary | ICD-10-CM | POA: Insufficient documentation

## 2020-08-18 DIAGNOSIS — Y999 Unspecified external cause status: Secondary | ICD-10-CM | POA: Diagnosis not present

## 2020-08-18 DIAGNOSIS — R52 Pain, unspecified: Secondary | ICD-10-CM | POA: Diagnosis not present

## 2020-08-18 DIAGNOSIS — Z7982 Long term (current) use of aspirin: Secondary | ICD-10-CM | POA: Diagnosis not present

## 2020-08-18 DIAGNOSIS — Z043 Encounter for examination and observation following other accident: Secondary | ICD-10-CM | POA: Diagnosis not present

## 2020-08-18 DIAGNOSIS — W19XXXA Unspecified fall, initial encounter: Secondary | ICD-10-CM | POA: Diagnosis not present

## 2020-08-18 DIAGNOSIS — Z87891 Personal history of nicotine dependence: Secondary | ICD-10-CM | POA: Insufficient documentation

## 2020-08-18 DIAGNOSIS — S93401A Sprain of unspecified ligament of right ankle, initial encounter: Secondary | ICD-10-CM

## 2020-08-18 MED ORDER — ACETAMINOPHEN 500 MG PO TABS
1000.0000 mg | ORAL_TABLET | Freq: Once | ORAL | Status: AC
  Administered 2020-08-18: 1000 mg via ORAL
  Filled 2020-08-18: qty 2

## 2020-08-18 MED ORDER — BACITRACIN ZINC 500 UNIT/GM EX OINT
1.0000 "application " | TOPICAL_OINTMENT | Freq: Two times a day (BID) | CUTANEOUS | Status: DC
  Administered 2020-08-18: 1 via TOPICAL
  Filled 2020-08-18: qty 0.9

## 2020-08-18 NOTE — ED Provider Notes (Signed)
[facility] DEPT Provider Note   CSN: [id number] Arrival date & time: 08/18/20  0425     History Chief Complaint  Patient presents with  . Ankle Pain    Rebekah Burton is a 44 y.o. female.  44 year old female with past medical history below including ESRD and PAD who presents with right ankle injury.  Just prior to arrival, the patient fell from her porch landing on her right ankle.  She did not hit her head or lose consciousness.  She reports severe, constant right ankle pain.  No medications prior to arrival.  The history is provided by the patient.  Ankle Pain      Past Medical History:  Diagnosis Date  . Blind   . ESRD (end stage renal disease) ([place])   . Foot ulceration ([place]) 12/2018  . Hypertension   . PAD (peripheral artery disease) [facility])     Patient Active Problem List   Diagnosis Date Noted  . Cellulitis of left lower extremity 02/05/2019  . Post-operative pain   . Acute blood loss anemia   . S/P transmetatarsal amputation of foot, left ([place])   . Ischemic ulcer of left foot ([place]) 01/16/2019  . ESRD (end stage renal disease) ([place]) 01/16/2019  . HTN (hypertension) 01/16/2019  . 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  . PAD (peripheral artery disease) ([place]) 01/16/2019    Past Surgical History:  Procedure Laterality Date  . ABDOMINAL AORTOGRAM W/LOWER EXTREMITY Bilateral 01/17/2019   Procedure: ABDOMINAL AORTOGRAM W/LOWER EXTREMITY;  Surgeon: Waynetta Sandy, MD;  Location: [facility];  Service: Cardiovascular;  Laterality: Bilateral;  . ABDOMINAL HYSTERECTOMY    . ENDARTERECTOMY FEMORAL Left 01/18/2019   Procedure: ENDARTERECTOMY FEMORAL with Perfundaplasty;  Surgeon: Rosetta Posner, MD;  Location: [facility];  Service: Vascular;  Laterality: Left;  . FEMORAL-POPLITEAL BYPASS GRAFT Left 01/18/2019   Procedure: Left  FEMORAL- to  Below the Knee POPLITEAL ARTERY bypass using reversed safenous vein.;  Surgeon: Rosetta Posner, MD;   Location: [facility] OR;  Service: Vascular;  Laterality: Left;  . toe removal Right    All toes on right foot have been removed.   . TRANSMETATARSAL AMPUTATION Left 01/18/2019   Procedure: TRANSMETATARSAL AMPUTATION;  Surgeon: Rosetta Posner, MD;  Location: [facility] OR;  Service: Vascular;  Laterality: Left;     OB History   No obstetric history on file.     Family History  Problem Relation Age of Onset  . Peripheral Artery Disease Neg Hx   . Kidney failure Neg Hx     Social History   Tobacco Use  . Smoking status: Former Smoker    Packs/day: 0.50    Years: 20.00    Pack years: 10.00    Types: Cigarettes    Quit date: 01/14/2019    Years since quitting: 1.5  . Smokeless tobacco: Never Used  . Tobacco comment: <1 PPD  Vaping Use  . Vaping Use: Never used  Substance Use Topics  . Alcohol use: Never  . Drug use: Never    Home Medications Prior to Admission medications   Medication Sig Start Date End Date Taking? Authorizing Provider  amLODipine (NORVASC) 10 MG tablet Take 10 mg by mouth daily. 11/23/18   [provider]  aspirin EC 81 MG EC tablet Take 1 tablet (81 mg total) by mouth daily. 01/26/19   Charlynne Cousins, MD  b complex-vitamin c-folic acid (NEPHRO-VITE) 0.8 MG TABS tablet Take 1 tablet by mouth every Monday,  Wednesday, and Friday with hemodialysis. 11/20/18   [provider]  carvedilol (COREG) 25 MG tablet Take 25 mg by mouth 2 (two) times daily. 01/04/19   [provider]  feeding supplement, ENSURE ENLIVE, (ENSURE ENLIVE) LIQD Take 237 mLs by mouth 2 (two) times daily between meals. 02/13/19   Mikhail, Velta Addison, DO  gabapentin (NEURONTIN) 100 MG capsule TK 1 C PO Q 12 HOURS FOR PAIN 04/18/19   [provider]  lidocaine-prilocaine (EMLA) cream Apply 1 application topically every Monday, Wednesday, and Friday with hemodialysis.  11/20/18   [provider]  nicotine (NICODERM CQ - DOSED IN MG/24 HOURS) 14 mg/24hr patch Place 1  patch (14 mg total) onto the skin daily as needed (smoking cessation). 02/13/19   Mikhail, Velta Addison, DO  oxyCODONE (OXY IR/ROXICODONE) 5 MG immediate release tablet Take 1 tablet (5 mg total) by mouth every 4 (four) hours as needed for moderate pain. Patient not taking: Reported on 05/14/2019 02/09/19   Elgergawy, Silver Huguenin, MD  RENVELA 800 MG tablet Take 800 mg by mouth See admin instructions. Take 2 tablets by mouth every 12 hours as needed for snacks 11/20/18   [provider]  senna-docusate (SENOKOT-S) 8.6-50 MG tablet Take 1 tablet by mouth 2 (two) times daily. 02/09/19   Elgergawy, Silver Huguenin, MD  traMADol (ULTRAM) 50 MG tablet Take 1 tablet (50 mg total) by mouth every 6 (six) hours as needed for moderate pain. Patient not taking: Reported on 05/14/2019 02/09/19   Elgergawy, Silver Huguenin, MD    Allergies    Patient has no known allergies.  Review of Systems   Review of Systems  Musculoskeletal: Positive for gait problem and joint swelling.  Skin: Positive for wound.  Neurological: Negative for syncope and numbness.    Physical Exam Updated Vital Signs BP (!) 153/102   Pulse 70   Temp 98.4 F (36.9 C)   Resp 18   Ht 5\' 4"  (1.626 m)   Wt 103.4 kg   SpO2 96%   BMI 39.14 kg/m   Physical Exam Vitals and nursing note reviewed.  Constitutional:      General: She is not in acute distress.    Appearance: She is well-developed.  HENT:     Head: Normocephalic and atraumatic.  Eyes:     Conjunctiva/sclera: Conjunctivae normal.  Cardiovascular:     Pulses: Normal pulses.  Musculoskeletal:        General: Swelling and tenderness present.     Cervical back: Neck supple.     Comments: B/l partial foot amputations; R ankle edematous, exquisite pain to light palpation, unable to examine further 2/2 pain  Skin:    General: Skin is warm and dry.  Neurological:     Mental Status: She is alert and oriented to person, place, and time.  Psychiatric:        Judgment: Judgment normal.      ED Results / Procedures / Treatments   Labs (all labs ordered are listed, but only abnormal results are displayed) Labs Reviewed - No data to display  EKG None  Radiology DG Ankle Complete Right  Result Date: 08/18/2020 CLINICAL DATA:  Fall with ankle pain and limited range of mobility. EXAM: RIGHT ANKLE - COMPLETE 3+ VIEW COMPARISON:  None. FINDINGS: There is no evidence of fracture or dislocation. Nonspecific soft tissue swelling with possible joint effusion. Osteopenia and premature arterial calcification, known from the chart history. IMPRESSION: Soft tissue swelling and possible ankle joint effusion. No fracture or  subluxation. Electronically Signed   By: Monte Fantasia M.D.   On: 08/18/2020 05:46    Procedures Procedures (including critical care time)  Medications Ordered in ED Medications  bacitracin ointment 1 application (1 application Topical Given 08/18/20 1220)  acetaminophen (TYLENOL) tablet 1,000 mg (1,000 mg Oral Given 08/18/20 1220)    ED Course  I have reviewed the triage vital signs and the nursing notes.  Pertinent  imaging results that were available during my care of the patient were reviewed by me and considered in my medical decision making (see chart for details).    MDM Rules/Calculators/A&P                          XR negative for fracture, suspect sprain based on exam. Placed in ASO and discussed supportive measures. Final Clinical Impression(s) / ED Diagnoses Final diagnoses:  Sprain of right ankle, unspecified ligament, initial encounter    Rx / DC Orders ED Discharge Orders    None       Elany Felix, Wenda Overland, MD 08/18/20 1305

## 2020-08-18 NOTE — Progress Notes (Signed)
Orthopedic Tech Progress Note Patient Details:  Rebekah Burton Mar 08, 1976 [id number]  Ortho Devices Ortho Device/Splint Location: applied aso Ortho Device/Splint Interventions: Ordered, Application   Post Interventions Patient Tolerated: Well Instructions Provided: Care of device   Braulio Bosch 08/18/2020, 12:31 PM

## 2020-08-18 NOTE — ED Triage Notes (Addendum)
Pt arrives EMS for right ankle pain after falling from porch. Dialysis patient. Completely blind.

## 2020-08-21 DIAGNOSIS — N186 End stage renal disease: Secondary | ICD-10-CM | POA: Diagnosis not present

## 2020-08-21 DIAGNOSIS — N2581 Secondary hyperparathyroidism of renal origin: Secondary | ICD-10-CM | POA: Diagnosis not present

## 2020-08-21 DIAGNOSIS — T782XXS Anaphylactic shock, unspecified, sequela: Secondary | ICD-10-CM | POA: Diagnosis not present

## 2020-08-21 DIAGNOSIS — D631 Anemia in chronic kidney disease: Secondary | ICD-10-CM | POA: Diagnosis not present

## 2020-08-21 DIAGNOSIS — Z992 Dependence on renal dialysis: Secondary | ICD-10-CM | POA: Diagnosis not present

## 2020-08-21 DIAGNOSIS — D509 Iron deficiency anemia, unspecified: Secondary | ICD-10-CM | POA: Diagnosis not present

## 2020-08-24 DIAGNOSIS — T782XXS Anaphylactic shock, unspecified, sequela: Secondary | ICD-10-CM | POA: Diagnosis not present

## 2020-08-24 DIAGNOSIS — D631 Anemia in chronic kidney disease: Secondary | ICD-10-CM | POA: Diagnosis not present

## 2020-08-24 DIAGNOSIS — D509 Iron deficiency anemia, unspecified: Secondary | ICD-10-CM | POA: Diagnosis not present

## 2020-08-24 DIAGNOSIS — Z992 Dependence on renal dialysis: Secondary | ICD-10-CM | POA: Diagnosis not present

## 2020-08-24 DIAGNOSIS — N2581 Secondary hyperparathyroidism of renal origin: Secondary | ICD-10-CM | POA: Diagnosis not present

## 2020-08-24 DIAGNOSIS — N186 End stage renal disease: Secondary | ICD-10-CM | POA: Diagnosis not present

## 2020-08-26 DIAGNOSIS — I129 Hypertensive chronic kidney disease with stage 1 through stage 4 chronic kidney disease, or unspecified chronic kidney disease: Secondary | ICD-10-CM | POA: Diagnosis not present

## 2020-08-26 DIAGNOSIS — I10 Essential (primary) hypertension: Secondary | ICD-10-CM | POA: Diagnosis not present

## 2020-08-26 DIAGNOSIS — N2581 Secondary hyperparathyroidism of renal origin: Secondary | ICD-10-CM | POA: Diagnosis not present

## 2020-08-26 DIAGNOSIS — G894 Chronic pain syndrome: Secondary | ICD-10-CM | POA: Diagnosis not present

## 2020-08-26 DIAGNOSIS — T782XXS Anaphylactic shock, unspecified, sequela: Secondary | ICD-10-CM | POA: Diagnosis not present

## 2020-08-26 DIAGNOSIS — I739 Peripheral vascular disease, unspecified: Secondary | ICD-10-CM | POA: Diagnosis not present

## 2020-08-26 DIAGNOSIS — Z992 Dependence on renal dialysis: Secondary | ICD-10-CM | POA: Diagnosis not present

## 2020-08-26 DIAGNOSIS — Z87891 Personal history of nicotine dependence: Secondary | ICD-10-CM | POA: Diagnosis not present

## 2020-08-26 DIAGNOSIS — D638 Anemia in other chronic diseases classified elsewhere: Secondary | ICD-10-CM | POA: Diagnosis not present

## 2020-08-26 DIAGNOSIS — N186 End stage renal disease: Secondary | ICD-10-CM | POA: Diagnosis not present

## 2020-08-26 DIAGNOSIS — M25571 Pain in right ankle and joints of right foot: Secondary | ICD-10-CM | POA: Diagnosis not present

## 2020-08-26 DIAGNOSIS — D631 Anemia in chronic kidney disease: Secondary | ICD-10-CM | POA: Diagnosis not present

## 2020-08-26 DIAGNOSIS — E669 Obesity, unspecified: Secondary | ICD-10-CM | POA: Diagnosis not present

## 2020-08-28 DIAGNOSIS — Z992 Dependence on renal dialysis: Secondary | ICD-10-CM | POA: Diagnosis not present

## 2020-08-28 DIAGNOSIS — D631 Anemia in chronic kidney disease: Secondary | ICD-10-CM | POA: Diagnosis not present

## 2020-08-28 DIAGNOSIS — N2581 Secondary hyperparathyroidism of renal origin: Secondary | ICD-10-CM | POA: Diagnosis not present

## 2020-08-28 DIAGNOSIS — N186 End stage renal disease: Secondary | ICD-10-CM | POA: Diagnosis not present

## 2020-08-28 DIAGNOSIS — T782XXS Anaphylactic shock, unspecified, sequela: Secondary | ICD-10-CM | POA: Diagnosis not present

## 2020-08-31 DIAGNOSIS — N2581 Secondary hyperparathyroidism of renal origin: Secondary | ICD-10-CM | POA: Diagnosis not present

## 2020-08-31 DIAGNOSIS — T782XXS Anaphylactic shock, unspecified, sequela: Secondary | ICD-10-CM | POA: Diagnosis not present

## 2020-08-31 DIAGNOSIS — D631 Anemia in chronic kidney disease: Secondary | ICD-10-CM | POA: Diagnosis not present

## 2020-08-31 DIAGNOSIS — N186 End stage renal disease: Secondary | ICD-10-CM | POA: Diagnosis not present

## 2020-08-31 DIAGNOSIS — Z992 Dependence on renal dialysis: Secondary | ICD-10-CM | POA: Diagnosis not present

## 2020-09-02 DIAGNOSIS — D631 Anemia in chronic kidney disease: Secondary | ICD-10-CM | POA: Diagnosis not present

## 2020-09-02 DIAGNOSIS — Z992 Dependence on renal dialysis: Secondary | ICD-10-CM | POA: Diagnosis not present

## 2020-09-02 DIAGNOSIS — N186 End stage renal disease: Secondary | ICD-10-CM | POA: Diagnosis not present

## 2020-09-02 DIAGNOSIS — N2581 Secondary hyperparathyroidism of renal origin: Secondary | ICD-10-CM | POA: Diagnosis not present

## 2020-09-02 DIAGNOSIS — T782XXS Anaphylactic shock, unspecified, sequela: Secondary | ICD-10-CM | POA: Diagnosis not present

## 2020-09-04 DIAGNOSIS — Z992 Dependence on renal dialysis: Secondary | ICD-10-CM | POA: Diagnosis not present

## 2020-09-04 DIAGNOSIS — D631 Anemia in chronic kidney disease: Secondary | ICD-10-CM | POA: Diagnosis not present

## 2020-09-04 DIAGNOSIS — T782XXS Anaphylactic shock, unspecified, sequela: Secondary | ICD-10-CM | POA: Diagnosis not present

## 2020-09-04 DIAGNOSIS — N2581 Secondary hyperparathyroidism of renal origin: Secondary | ICD-10-CM | POA: Diagnosis not present

## 2020-09-04 DIAGNOSIS — N186 End stage renal disease: Secondary | ICD-10-CM | POA: Diagnosis not present

## 2020-09-07 DIAGNOSIS — D631 Anemia in chronic kidney disease: Secondary | ICD-10-CM | POA: Diagnosis not present

## 2020-09-07 DIAGNOSIS — T782XXS Anaphylactic shock, unspecified, sequela: Secondary | ICD-10-CM | POA: Diagnosis not present

## 2020-09-07 DIAGNOSIS — N2581 Secondary hyperparathyroidism of renal origin: Secondary | ICD-10-CM | POA: Diagnosis not present

## 2020-09-07 DIAGNOSIS — Z992 Dependence on renal dialysis: Secondary | ICD-10-CM | POA: Diagnosis not present

## 2020-09-07 DIAGNOSIS — N186 End stage renal disease: Secondary | ICD-10-CM | POA: Diagnosis not present

## 2020-09-09 DIAGNOSIS — D631 Anemia in chronic kidney disease: Secondary | ICD-10-CM | POA: Diagnosis not present

## 2020-09-09 DIAGNOSIS — T782XXS Anaphylactic shock, unspecified, sequela: Secondary | ICD-10-CM | POA: Diagnosis not present

## 2020-09-09 DIAGNOSIS — N2581 Secondary hyperparathyroidism of renal origin: Secondary | ICD-10-CM | POA: Diagnosis not present

## 2020-09-09 DIAGNOSIS — N186 End stage renal disease: Secondary | ICD-10-CM | POA: Diagnosis not present

## 2020-09-09 DIAGNOSIS — Z992 Dependence on renal dialysis: Secondary | ICD-10-CM | POA: Diagnosis not present

## 2020-09-11 DIAGNOSIS — N2581 Secondary hyperparathyroidism of renal origin: Secondary | ICD-10-CM | POA: Diagnosis not present

## 2020-09-11 DIAGNOSIS — N186 End stage renal disease: Secondary | ICD-10-CM | POA: Diagnosis not present

## 2020-09-11 DIAGNOSIS — Z992 Dependence on renal dialysis: Secondary | ICD-10-CM | POA: Diagnosis not present

## 2020-09-11 DIAGNOSIS — T782XXS Anaphylactic shock, unspecified, sequela: Secondary | ICD-10-CM | POA: Diagnosis not present

## 2020-09-11 DIAGNOSIS — D631 Anemia in chronic kidney disease: Secondary | ICD-10-CM | POA: Diagnosis not present

## 2020-09-14 DIAGNOSIS — Z992 Dependence on renal dialysis: Secondary | ICD-10-CM | POA: Diagnosis not present

## 2020-09-14 DIAGNOSIS — D631 Anemia in chronic kidney disease: Secondary | ICD-10-CM | POA: Diagnosis not present

## 2020-09-14 DIAGNOSIS — N186 End stage renal disease: Secondary | ICD-10-CM | POA: Diagnosis not present

## 2020-09-14 DIAGNOSIS — N2581 Secondary hyperparathyroidism of renal origin: Secondary | ICD-10-CM | POA: Diagnosis not present

## 2020-09-14 DIAGNOSIS — T782XXS Anaphylactic shock, unspecified, sequela: Secondary | ICD-10-CM | POA: Diagnosis not present

## 2020-09-16 DIAGNOSIS — N186 End stage renal disease: Secondary | ICD-10-CM | POA: Diagnosis not present

## 2020-09-16 DIAGNOSIS — T782XXS Anaphylactic shock, unspecified, sequela: Secondary | ICD-10-CM | POA: Diagnosis not present

## 2020-09-16 DIAGNOSIS — N2581 Secondary hyperparathyroidism of renal origin: Secondary | ICD-10-CM | POA: Diagnosis not present

## 2020-09-16 DIAGNOSIS — D631 Anemia in chronic kidney disease: Secondary | ICD-10-CM | POA: Diagnosis not present

## 2020-09-16 DIAGNOSIS — Z992 Dependence on renal dialysis: Secondary | ICD-10-CM | POA: Diagnosis not present

## 2020-09-18 DIAGNOSIS — T782XXS Anaphylactic shock, unspecified, sequela: Secondary | ICD-10-CM | POA: Diagnosis not present

## 2020-09-18 DIAGNOSIS — N2581 Secondary hyperparathyroidism of renal origin: Secondary | ICD-10-CM | POA: Diagnosis not present

## 2020-09-18 DIAGNOSIS — D631 Anemia in chronic kidney disease: Secondary | ICD-10-CM | POA: Diagnosis not present

## 2020-09-18 DIAGNOSIS — Z992 Dependence on renal dialysis: Secondary | ICD-10-CM | POA: Diagnosis not present

## 2020-09-18 DIAGNOSIS — N186 End stage renal disease: Secondary | ICD-10-CM | POA: Diagnosis not present

## 2020-09-21 DIAGNOSIS — Z992 Dependence on renal dialysis: Secondary | ICD-10-CM | POA: Diagnosis not present

## 2020-09-21 DIAGNOSIS — D631 Anemia in chronic kidney disease: Secondary | ICD-10-CM | POA: Diagnosis not present

## 2020-09-21 DIAGNOSIS — N186 End stage renal disease: Secondary | ICD-10-CM | POA: Diagnosis not present

## 2020-09-21 DIAGNOSIS — N2581 Secondary hyperparathyroidism of renal origin: Secondary | ICD-10-CM | POA: Diagnosis not present

## 2020-09-21 DIAGNOSIS — T782XXS Anaphylactic shock, unspecified, sequela: Secondary | ICD-10-CM | POA: Diagnosis not present

## 2020-09-23 DIAGNOSIS — Z992 Dependence on renal dialysis: Secondary | ICD-10-CM | POA: Diagnosis not present

## 2020-09-23 DIAGNOSIS — D631 Anemia in chronic kidney disease: Secondary | ICD-10-CM | POA: Diagnosis not present

## 2020-09-23 DIAGNOSIS — T782XXS Anaphylactic shock, unspecified, sequela: Secondary | ICD-10-CM | POA: Diagnosis not present

## 2020-09-23 DIAGNOSIS — N2581 Secondary hyperparathyroidism of renal origin: Secondary | ICD-10-CM | POA: Diagnosis not present

## 2020-09-23 DIAGNOSIS — N186 End stage renal disease: Secondary | ICD-10-CM | POA: Diagnosis not present

## 2020-09-25 DIAGNOSIS — Z992 Dependence on renal dialysis: Secondary | ICD-10-CM | POA: Diagnosis not present

## 2020-09-25 DIAGNOSIS — T782XXS Anaphylactic shock, unspecified, sequela: Secondary | ICD-10-CM | POA: Diagnosis not present

## 2020-09-25 DIAGNOSIS — N186 End stage renal disease: Secondary | ICD-10-CM | POA: Diagnosis not present

## 2020-09-25 DIAGNOSIS — I129 Hypertensive chronic kidney disease with stage 1 through stage 4 chronic kidney disease, or unspecified chronic kidney disease: Secondary | ICD-10-CM | POA: Diagnosis not present

## 2020-09-25 DIAGNOSIS — N2581 Secondary hyperparathyroidism of renal origin: Secondary | ICD-10-CM | POA: Diagnosis not present

## 2020-09-25 DIAGNOSIS — D631 Anemia in chronic kidney disease: Secondary | ICD-10-CM | POA: Diagnosis not present

## 2020-09-25 DIAGNOSIS — D509 Iron deficiency anemia, unspecified: Secondary | ICD-10-CM | POA: Diagnosis not present

## 2020-09-28 DIAGNOSIS — Z992 Dependence on renal dialysis: Secondary | ICD-10-CM | POA: Diagnosis not present

## 2020-09-28 DIAGNOSIS — T782XXS Anaphylactic shock, unspecified, sequela: Secondary | ICD-10-CM | POA: Diagnosis not present

## 2020-09-28 DIAGNOSIS — D631 Anemia in chronic kidney disease: Secondary | ICD-10-CM | POA: Diagnosis not present

## 2020-09-28 DIAGNOSIS — N2581 Secondary hyperparathyroidism of renal origin: Secondary | ICD-10-CM | POA: Diagnosis not present

## 2020-09-28 DIAGNOSIS — N186 End stage renal disease: Secondary | ICD-10-CM | POA: Diagnosis not present

## 2020-09-28 DIAGNOSIS — D509 Iron deficiency anemia, unspecified: Secondary | ICD-10-CM | POA: Diagnosis not present

## 2020-09-30 DIAGNOSIS — D509 Iron deficiency anemia, unspecified: Secondary | ICD-10-CM | POA: Diagnosis not present

## 2020-09-30 DIAGNOSIS — Z992 Dependence on renal dialysis: Secondary | ICD-10-CM | POA: Diagnosis not present

## 2020-09-30 DIAGNOSIS — T782XXS Anaphylactic shock, unspecified, sequela: Secondary | ICD-10-CM | POA: Diagnosis not present

## 2020-09-30 DIAGNOSIS — N186 End stage renal disease: Secondary | ICD-10-CM | POA: Diagnosis not present

## 2020-09-30 DIAGNOSIS — N2581 Secondary hyperparathyroidism of renal origin: Secondary | ICD-10-CM | POA: Diagnosis not present

## 2020-09-30 DIAGNOSIS — D631 Anemia in chronic kidney disease: Secondary | ICD-10-CM | POA: Diagnosis not present

## 2020-10-02 DIAGNOSIS — T782XXS Anaphylactic shock, unspecified, sequela: Secondary | ICD-10-CM | POA: Diagnosis not present

## 2020-10-02 DIAGNOSIS — Z992 Dependence on renal dialysis: Secondary | ICD-10-CM | POA: Diagnosis not present

## 2020-10-02 DIAGNOSIS — D509 Iron deficiency anemia, unspecified: Secondary | ICD-10-CM | POA: Diagnosis not present

## 2020-10-02 DIAGNOSIS — N2581 Secondary hyperparathyroidism of renal origin: Secondary | ICD-10-CM | POA: Diagnosis not present

## 2020-10-02 DIAGNOSIS — N186 End stage renal disease: Secondary | ICD-10-CM | POA: Diagnosis not present

## 2020-10-02 DIAGNOSIS — D631 Anemia in chronic kidney disease: Secondary | ICD-10-CM | POA: Diagnosis not present

## 2020-10-05 DIAGNOSIS — N6320 Unspecified lump in the left breast, unspecified quadrant: Secondary | ICD-10-CM | POA: Diagnosis not present

## 2020-10-05 DIAGNOSIS — Z992 Dependence on renal dialysis: Secondary | ICD-10-CM | POA: Diagnosis not present

## 2020-10-05 DIAGNOSIS — R52 Pain, unspecified: Secondary | ICD-10-CM | POA: Diagnosis not present

## 2020-10-05 DIAGNOSIS — E669 Obesity, unspecified: Secondary | ICD-10-CM | POA: Diagnosis not present

## 2020-10-05 DIAGNOSIS — N186 End stage renal disease: Secondary | ICD-10-CM | POA: Diagnosis not present

## 2020-10-05 DIAGNOSIS — Z0001 Encounter for general adult medical examination with abnormal findings: Secondary | ICD-10-CM | POA: Diagnosis not present

## 2020-10-05 DIAGNOSIS — D631 Anemia in chronic kidney disease: Secondary | ICD-10-CM | POA: Diagnosis not present

## 2020-10-05 DIAGNOSIS — D509 Iron deficiency anemia, unspecified: Secondary | ICD-10-CM | POA: Diagnosis not present

## 2020-10-05 DIAGNOSIS — I10 Essential (primary) hypertension: Secondary | ICD-10-CM | POA: Diagnosis not present

## 2020-10-05 DIAGNOSIS — T782XXS Anaphylactic shock, unspecified, sequela: Secondary | ICD-10-CM | POA: Diagnosis not present

## 2020-10-05 DIAGNOSIS — N2581 Secondary hyperparathyroidism of renal origin: Secondary | ICD-10-CM | POA: Diagnosis not present

## 2020-10-05 DIAGNOSIS — Z87891 Personal history of nicotine dependence: Secondary | ICD-10-CM | POA: Diagnosis not present

## 2020-10-07 DIAGNOSIS — N2581 Secondary hyperparathyroidism of renal origin: Secondary | ICD-10-CM | POA: Diagnosis not present

## 2020-10-07 DIAGNOSIS — N186 End stage renal disease: Secondary | ICD-10-CM | POA: Diagnosis not present

## 2020-10-07 DIAGNOSIS — D509 Iron deficiency anemia, unspecified: Secondary | ICD-10-CM | POA: Diagnosis not present

## 2020-10-07 DIAGNOSIS — Z992 Dependence on renal dialysis: Secondary | ICD-10-CM | POA: Diagnosis not present

## 2020-10-07 DIAGNOSIS — T782XXS Anaphylactic shock, unspecified, sequela: Secondary | ICD-10-CM | POA: Diagnosis not present

## 2020-10-07 DIAGNOSIS — D631 Anemia in chronic kidney disease: Secondary | ICD-10-CM | POA: Diagnosis not present

## 2020-10-08 ENCOUNTER — Other Ambulatory Visit: Payer: Self-pay | Admitting: Physician Assistant

## 2020-10-08 DIAGNOSIS — N63 Unspecified lump in unspecified breast: Secondary | ICD-10-CM

## 2020-10-08 DIAGNOSIS — N644 Mastodynia: Secondary | ICD-10-CM

## 2020-10-09 DIAGNOSIS — N2581 Secondary hyperparathyroidism of renal origin: Secondary | ICD-10-CM | POA: Diagnosis not present

## 2020-10-09 DIAGNOSIS — Z992 Dependence on renal dialysis: Secondary | ICD-10-CM | POA: Diagnosis not present

## 2020-10-09 DIAGNOSIS — T782XXS Anaphylactic shock, unspecified, sequela: Secondary | ICD-10-CM | POA: Diagnosis not present

## 2020-10-09 DIAGNOSIS — N186 End stage renal disease: Secondary | ICD-10-CM | POA: Diagnosis not present

## 2020-10-09 DIAGNOSIS — D509 Iron deficiency anemia, unspecified: Secondary | ICD-10-CM | POA: Diagnosis not present

## 2020-10-09 DIAGNOSIS — D631 Anemia in chronic kidney disease: Secondary | ICD-10-CM | POA: Diagnosis not present

## 2020-10-12 DIAGNOSIS — D509 Iron deficiency anemia, unspecified: Secondary | ICD-10-CM | POA: Diagnosis not present

## 2020-10-12 DIAGNOSIS — T782XXS Anaphylactic shock, unspecified, sequela: Secondary | ICD-10-CM | POA: Diagnosis not present

## 2020-10-12 DIAGNOSIS — N186 End stage renal disease: Secondary | ICD-10-CM | POA: Diagnosis not present

## 2020-10-12 DIAGNOSIS — N2581 Secondary hyperparathyroidism of renal origin: Secondary | ICD-10-CM | POA: Diagnosis not present

## 2020-10-12 DIAGNOSIS — D631 Anemia in chronic kidney disease: Secondary | ICD-10-CM | POA: Diagnosis not present

## 2020-10-12 DIAGNOSIS — Z992 Dependence on renal dialysis: Secondary | ICD-10-CM | POA: Diagnosis not present

## 2020-10-14 DIAGNOSIS — D631 Anemia in chronic kidney disease: Secondary | ICD-10-CM | POA: Diagnosis not present

## 2020-10-14 DIAGNOSIS — N186 End stage renal disease: Secondary | ICD-10-CM | POA: Diagnosis not present

## 2020-10-14 DIAGNOSIS — Z992 Dependence on renal dialysis: Secondary | ICD-10-CM | POA: Diagnosis not present

## 2020-10-14 DIAGNOSIS — N2581 Secondary hyperparathyroidism of renal origin: Secondary | ICD-10-CM | POA: Diagnosis not present

## 2020-10-14 DIAGNOSIS — T782XXS Anaphylactic shock, unspecified, sequela: Secondary | ICD-10-CM | POA: Diagnosis not present

## 2020-10-14 DIAGNOSIS — D509 Iron deficiency anemia, unspecified: Secondary | ICD-10-CM | POA: Diagnosis not present

## 2020-10-16 DIAGNOSIS — T782XXS Anaphylactic shock, unspecified, sequela: Secondary | ICD-10-CM | POA: Diagnosis not present

## 2020-10-16 DIAGNOSIS — D631 Anemia in chronic kidney disease: Secondary | ICD-10-CM | POA: Diagnosis not present

## 2020-10-16 DIAGNOSIS — N2581 Secondary hyperparathyroidism of renal origin: Secondary | ICD-10-CM | POA: Diagnosis not present

## 2020-10-16 DIAGNOSIS — N186 End stage renal disease: Secondary | ICD-10-CM | POA: Diagnosis not present

## 2020-10-16 DIAGNOSIS — Z992 Dependence on renal dialysis: Secondary | ICD-10-CM | POA: Diagnosis not present

## 2020-10-16 DIAGNOSIS — D509 Iron deficiency anemia, unspecified: Secondary | ICD-10-CM | POA: Diagnosis not present

## 2020-10-19 DIAGNOSIS — D631 Anemia in chronic kidney disease: Secondary | ICD-10-CM | POA: Diagnosis not present

## 2020-10-19 DIAGNOSIS — D509 Iron deficiency anemia, unspecified: Secondary | ICD-10-CM | POA: Diagnosis not present

## 2020-10-19 DIAGNOSIS — Z992 Dependence on renal dialysis: Secondary | ICD-10-CM | POA: Diagnosis not present

## 2020-10-19 DIAGNOSIS — N186 End stage renal disease: Secondary | ICD-10-CM | POA: Diagnosis not present

## 2020-10-19 DIAGNOSIS — N2581 Secondary hyperparathyroidism of renal origin: Secondary | ICD-10-CM | POA: Diagnosis not present

## 2020-10-19 DIAGNOSIS — T782XXS Anaphylactic shock, unspecified, sequela: Secondary | ICD-10-CM | POA: Diagnosis not present

## 2020-10-21 DIAGNOSIS — D631 Anemia in chronic kidney disease: Secondary | ICD-10-CM | POA: Diagnosis not present

## 2020-10-21 DIAGNOSIS — N2581 Secondary hyperparathyroidism of renal origin: Secondary | ICD-10-CM | POA: Diagnosis not present

## 2020-10-21 DIAGNOSIS — D509 Iron deficiency anemia, unspecified: Secondary | ICD-10-CM | POA: Diagnosis not present

## 2020-10-21 DIAGNOSIS — N186 End stage renal disease: Secondary | ICD-10-CM | POA: Diagnosis not present

## 2020-10-21 DIAGNOSIS — Z992 Dependence on renal dialysis: Secondary | ICD-10-CM | POA: Diagnosis not present

## 2020-10-21 DIAGNOSIS — T782XXS Anaphylactic shock, unspecified, sequela: Secondary | ICD-10-CM | POA: Diagnosis not present

## 2020-10-23 ENCOUNTER — Ambulatory Visit (HOSPITAL_COMMUNITY)
Admission: RE | Admit: 2020-10-23 | Discharge: 2020-10-23 | Disposition: A | Discharge status: Home / Self Care | Payer: Medicare Other | Source: Ambulatory Visit | Attending: Nephrology | Admitting: Nephrology

## 2020-10-23 ENCOUNTER — Other Ambulatory Visit (HOSPITAL_COMMUNITY): Payer: Self-pay | Admitting: Nephrology

## 2020-10-23 ENCOUNTER — Other Ambulatory Visit: Payer: Self-pay

## 2020-10-23 DIAGNOSIS — N186 End stage renal disease: Secondary | ICD-10-CM

## 2020-10-23 DIAGNOSIS — Y841 Kidney dialysis as the cause of abnormal reaction of the patient, or of later complication, without mention of misadventure at the time of the procedure: Secondary | ICD-10-CM | POA: Diagnosis not present

## 2020-10-23 DIAGNOSIS — Z87891 Personal history of nicotine dependence: Secondary | ICD-10-CM | POA: Insufficient documentation

## 2020-10-23 DIAGNOSIS — I120 Hypertensive chronic kidney disease with stage 5 chronic kidney disease or end stage renal disease: Secondary | ICD-10-CM | POA: Insufficient documentation

## 2020-10-23 DIAGNOSIS — T82868A Thrombosis of vascular prosthetic devices, implants and grafts, initial encounter: Secondary | ICD-10-CM | POA: Insufficient documentation

## 2020-10-23 DIAGNOSIS — T82858A Stenosis of vascular prosthetic devices, implants and grafts, initial encounter: Secondary | ICD-10-CM | POA: Diagnosis not present

## 2020-10-23 DIAGNOSIS — Z992 Dependence on renal dialysis: Secondary | ICD-10-CM | POA: Insufficient documentation

## 2020-10-23 HISTORY — PX: IR THROMBECTOMY AV FISTULA W/THROMBOLYSIS/PTA/STENT INC/SHUNT/IMG LT: IMG6107

## 2020-10-23 LAB — POCT I-STAT, CHEM 8
BUN: 54 mg/dL — ABNORMAL HIGH (ref 6–20)
Calcium, Ion: 1.12 mmol/L — ABNORMAL LOW (ref 1.15–1.40)
Chloride: 96 mmol/L — ABNORMAL LOW (ref 98–111)
Creatinine, Ser: 14.2 mg/dL — ABNORMAL HIGH (ref 0.44–1.00)
Glucose, Bld: 90 mg/dL (ref 70–99)
HCT: 32 % — ABNORMAL LOW (ref 36.0–46.0)
Hemoglobin: 10.9 g/dL — ABNORMAL LOW (ref 12.0–15.0)
Potassium: 4.4 mmol/L (ref 3.5–5.1)
Sodium: 136 mmol/L (ref 135–145)
TCO2: 30 mmol/L (ref 22–32)

## 2020-10-23 MED ORDER — IOHEXOL 300 MG/ML  SOLN
100.0000 mL | Freq: Once | INTRAMUSCULAR | Status: AC | PRN
  Administered 2020-10-23: 75 mL via INTRA_ARTERIAL

## 2020-10-23 MED ORDER — MIDAZOLAM HCL 2 MG/2ML IJ SOLN
INTRAMUSCULAR | Status: DC | PRN
  Administered 2020-10-23 (×2): 0.5 mg via INTRAVENOUS
  Administered 2020-10-23: 1 mg via INTRAVENOUS
  Administered 2020-10-23: 0.5 mg via INTRAVENOUS

## 2020-10-23 MED ORDER — ALTEPLASE 2 MG IJ SOLR
INTRAMUSCULAR | Status: AC
  Filled 2020-10-23: qty 4

## 2020-10-23 MED ORDER — FENTANYL CITRATE (PF) 100 MCG/2ML IJ SOLN
INTRAMUSCULAR | Status: DC | PRN
Start: 2020-10-23 — End: 2020-10-24
  Administered 2020-10-23 (×6): 25 ug via INTRAVENOUS

## 2020-10-23 MED ORDER — SODIUM CHLORIDE 0.9 % IV SOLN
INTRAVENOUS | Status: DC | PRN
  Administered 2020-10-23: 10 mL/h via INTRAVENOUS

## 2020-10-23 MED ORDER — HEPARIN SODIUM (PORCINE) 1000 UNIT/ML IJ SOLN
INTRAMUSCULAR | Status: DC | PRN
  Administered 2020-10-23: 3000 [IU] via INTRAVENOUS

## 2020-10-23 MED ORDER — FENTANYL CITRATE (PF) 100 MCG/2ML IJ SOLN
INTRAMUSCULAR | Status: AC
  Filled 2020-10-23: qty 2

## 2020-10-23 MED ORDER — LIDOCAINE HCL 1 % IJ SOLN
INTRAMUSCULAR | Status: AC
  Filled 2020-10-23: qty 20

## 2020-10-23 MED ORDER — ALTEPLASE 100 MG IV SOLR
INTRAVENOUS | Status: DC | PRN
  Administered 2020-10-23: 3 mg
  Administered 2020-10-23: 1 mg

## 2020-10-23 MED ORDER — IOHEXOL 300 MG/ML  SOLN: 50.0000 mL | Freq: Once | INTRAMUSCULAR | Status: DC | PRN

## 2020-10-23 MED ORDER — MIDAZOLAM HCL 2 MG/2ML IJ SOLN
INTRAMUSCULAR | Status: AC
  Filled 2020-10-23: qty 2

## 2020-10-23 MED ORDER — HEPARIN SODIUM (PORCINE) 1000 UNIT/ML IJ SOLN
INTRAMUSCULAR | Status: AC
  Filled 2020-10-23: qty 1

## 2020-10-23 MED ORDER — LIDOCAINE HCL 1 % IJ SOLN
INTRAMUSCULAR | Status: DC | PRN
  Administered 2020-10-23: 10 mL

## 2020-10-23 NOTE — Procedures (Signed)
Interventional Radiology Procedure Note  History: 44 yo female, LUE brachiocephalic fistula, ~44 yrs old, from [place].  States "never been worked on".    Thrombosed.   Report of abnormal right and left IJ veins.   Procedure:  US guided access x 2.  Mechanical and pharm thrombectomy/lysis of LUE fistula.   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  This was performed, as the venogram demonstrates an SVC occlusion, and catheter access is limited.   Complications: None  Findings: restoration of excellent thrill at the conclusion.  Recommendations:  - Ok to use - 1 hr recovery - If this access fails, would require inguinal/femoral tunneled catheter, as above - The stented segment covering the aneurysm at the site of previous frequent access is ~10cm, and should not be punctured for at least 48-72 hrs.  - Do not submerge for 7 days - Routine care   Signed,  Dulcy Fanny. Earleen Newport, DO

## 2020-10-23 NOTE — H&P (Signed)
Chief Complaint: Patient was seen in consultation today for clotted (L)UE AVF at the request of [place] W  Referring Physician(s): Otelia Santee W  Supervising Physician: Corrie Mckusick  Patient Status: [name] - Out-pt  History of Present Illness: Rebekah Burton is a 44 y.o. female with ESRD. She has had a (L)UE AVF for about 11 years without issue. She had HD a few days ago but today they were pulling clots. She went to the outpt center where they perform US showing extensive thrombus, but declot procedure was not attempted. They did attempt to access the right IJ for HD cath placement but were not successful. She has been referred to IR for possible attempt at declot of AVF vs placement of HD cath. PMHx, meds, labs, imaging, allergies reviewed. Feels well, no recent fevers, chills, illness. Has been NPO today as directed.   Past Medical History:  Diagnosis Date  . Blind   . ESRD (end stage renal disease) ([place])   . Foot ulceration ([place]) 12/2018  . Hypertension   . PAD (peripheral artery disease) ([place])     Past Surgical History:  Procedure Laterality Date  . ABDOMINAL AORTOGRAM W/LOWER EXTREMITY Bilateral 01/17/2019   Procedure: ABDOMINAL AORTOGRAM W/LOWER EXTREMITY;  Surgeon: Waynetta Sandy, MD;  Location: [facility];  Service: Cardiovascular;  Laterality: Bilateral;  . ABDOMINAL HYSTERECTOMY    . ENDARTERECTOMY FEMORAL Left 01/18/2019   Procedure: ENDARTERECTOMY FEMORAL with Perfundaplasty;  Surgeon: Rosetta Posner, MD;  Location: [place];  Service: Vascular;  Laterality: Left;  . FEMORAL-POPLITEAL BYPASS GRAFT Left 01/18/2019   Procedure: Left  FEMORAL- to  Below the Knee POPLITEAL ARTERY bypass using reversed safenous vein.;  Surgeon: Rosetta Posner, MD;  Location: [facility];  Service: Vascular;  Laterality: Left;  . toe removal Right    All toes on right foot have been removed.   . 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  Medications: Prior to Admission medications   Medication Sig Start Date End Date Taking? Authorizing Provider  amLODipine (NORVASC) 10 MG tablet Take 10 mg by mouth daily. 11/23/18   [provider]  aspirin EC 81 MG EC tablet Take 1 tablet (81 mg total) by mouth daily. 01/26/19   Charlynne Cousins, MD  b complex-vitamin c-folic acid (NEPHRO-VITE) 0.8 MG TABS tablet Take 1 tablet by mouth every Monday, Wednesday, and Friday with hemodialysis. 11/20/18   [provider]  carvedilol (COREG) 25 MG tablet Take 25 mg by mouth 2 (two) times daily. 01/04/19   [provider]  feeding supplement, ENSURE ENLIVE, (ENSURE ENLIVE) LIQD Take 237 mLs by mouth 2 (two) times daily between meals. 02/13/19   Mikhail, Velta Addison, DO  gabapentin (NEURONTIN) 100 MG capsule TK 1 C PO Q 12 HOURS FOR PAIN 04/18/19   [provider]  lidocaine-prilocaine (EMLA) cream Apply 1 application topically every Monday, Wednesday, and Friday with hemodialysis.  11/20/18   [provider]  nicotine (NICODERM CQ - DOSED IN MG/24 HOURS) 14 mg/24hr patch Place 1 patch (14 mg total) onto the skin daily as needed (smoking cessation). 02/13/19   Mikhail, Velta Addison, DO  oxyCODONE (OXY IR/ROXICODONE) 5 MG immediate release tablet Take 1 tablet (5 mg total) by mouth every 4 (four) hours as needed for moderate pain. Patient not taking: Reported on 05/14/2019 02/09/19  Elgergawy, Silver Huguenin, MD  RENVELA 800 MG tablet Take 800 mg by mouth See admin instructions. Take 2 tablets by mouth every 12 hours as needed for snacks 11/20/18   [provider]  senna-docusate (SENOKOT-S) 8.6-50 MG tablet Take 1 tablet by mouth 2 (two) times daily. 02/09/19   Elgergawy, Silver Huguenin, MD  traMADol (ULTRAM) 50 MG tablet Take 1 tablet (50 mg total) by mouth every 6 (six) hours as needed for  moderate pain. Patient not taking: Reported on 05/14/2019 02/09/19   Elgergawy, Silver Huguenin, MD     Family History  Problem Relation Age of Onset  . Peripheral Artery Disease Neg Hx   . Kidney failure Neg Hx     Social History   Socioeconomic History  . Marital status: Single    Spouse name: Not on file  . Number of children: Not on file  . Years of education: Not on file  . Highest education level: Not on file  Occupational History  . Not on file  Tobacco Use  . Smoking status: Former Smoker    Packs/day: 0.50    Years: 20.00    Pack years: 10.00    Types: Cigarettes    Quit date: 01/14/2019    Years since quitting: 1.7  . Smokeless tobacco: Never Used  . Tobacco comment: <1 PPD  Vaping Use  . Vaping Use: Never used  Substance and Sexual Activity  . Alcohol use: Never  . Drug use: Never  . Sexual activity: Never  Other Topics Concern  . Not on file  Social History Narrative  . Not on file   Social Determinants of Health   Financial Resource Strain:   . Difficulty of Paying Living Expenses: Not on file  Food Insecurity:   . Worried About Charity fundraiser in the Last Year: Not on file  . Ran Out of Food in the Last Year: Not on file  Transportation Needs:   . Lack of Transportation (Medical): Not on file  . Lack of Transportation (Non-Medical): Not on file  Physical Activity:   . Days of Exercise per Week: Not on file  . Minutes of Exercise per Session: Not on file  Stress:   . Feeling of Stress : Not on file  Social Connections:   . Frequency of Communication with Friends and Family: Not on file  . Frequency of Social Gatherings with Friends and Family: Not on file  . Attends Religious Services: Not on file  . Active Member of Clubs or Organizations: Not on file  . Attends Archivist Meetings: Not on file  . Marital Status: Not on file     Review of Systems: A 12 point ROS discussed and pertinent positives are indicated in the HPI above.  All  other systems are negative.  Review of Systems  Vital Signs: BP (!) 179/130   Pulse 60   Temp 97.9 F (36.6 C) (Oral)   Resp 18   Ht 5\' 4"  (1.626 m)   Wt 109.3 kg   SpO2 100%   BMI 41.37 kg/m   Physical Exam Constitutional:      Appearance: Normal appearance. She is obese. She is not ill-appearing.  HENT:     Mouth/Throat:     Mouth: Mucous membranes are moist.     Pharynx: Oropharynx is clear.  Cardiovascular:     Rate and Rhythm: Normal rate and regular rhythm.     Heart sounds: Normal heart sounds.  Pulmonary:  Effort: Pulmonary effort is normal. No respiratory distress.     Breath sounds: Normal breath sounds.  Skin:    Comments: (L)UE AVF noted, no palpable pulse or thrill. Excellent brachial and radial pulses. Hand warm  Neurological:     General: No focal deficit present.     Mental Status: She is alert and oriented to person, place, and time.  Psychiatric:        Thought Content: Thought content normal.        Judgment: Judgment normal.     Assessment and Plan: Clotted (L) UE AVF Discussed thrombolysis/thrombectomy of AV Graft/Fistula, possible angioplasty, possible stent, possible HD catheter placement if necessary. Risks, benefits, use of sedation thoroughly explained.   Thank you for this interesting consult.  I greatly enjoyed meeting Rebekah Burton and look forward to participating in their care.  A copy of this report was sent to the requesting provider on this date.  Electronically Signed: Ascencion Dike, PA-C 10/23/2020, 3:57 PM   I spent a total of 20 minutes in face to face in clinical consultation, greater than 50% of which was counseling/coordinating care for clotted AVF

## 2020-10-23 NOTE — Discharge Instructions (Addendum)
Dialysis Vascular Access Malfunction        A vascular access is an entrance to your blood vessels that can be used for dialysis. Dialysis is a treatment used for kidney failure. A health care provider can make a vascular access in many ways, such as by:  Joining an artery to a vein under your skin to make a bigger blood vessel called a fistula.  Joining an artery to a vein under your skin using a soft tube called a graft.  Placing a thin, flexible tube (catheter) in a large vein, usually in your neck, chest, or groin. A vascular access can become blocked or stop working correctly (malfunction). What can cause my vascular access to malfunction?  Infection. This is the most common cause of malfunction.  A blood clot inside a part of the fistula, graft, or catheter. A blood clot can completely or partially block the flow of blood.  A kink in the graft or catheter.  A collection of blood (hematoma or bruise) next to the graft or catheter that pushes against it, blocking the flow of blood. What are signs and symptoms of vascular access malfunction? Signs and symptoms of vascular access malfunction include:  A change in the vibration or pulse (thrill) of your fistula or graft.  The thrill of your fistula or graft being gone.  New or unusual swelling of the area around the access.  An unsuccessful puncture of your access by the dialysis team.  The flow of blood through the fistula, graft, or catheter being too slow for effective dialysis.  Bleeding that cannot be easily controlled when routine dialysis is completed and the needle is removed.  Signs of infection such as pain, swelling, redness, red streaks, numbness, and blood or pus coming from or around the access. What happens if my vascular access malfunctions? If your vascular access malfunctions, your health care provider may order blood work, cultures, or an X-ray test to find out what went wrong. The X-ray test involves the  injection of a dye (contrast) into the vascular access. The contrast shows up on the X-ray and lets your health care provider see if there is a blockage in the vascular access. Treatment varies depending on the cause of the malfunction:  If the vascular access is infected, your health care provider may prescribe antibiotic medicine to control the infection.  If a clot is found in the vascular access, you may need surgery to remove the clot. Blood thinning medicines may also be used.  If a blockage in the vascular access is due to some other cause, such as a kink in a graft, you will likely need surgery to unblock or replace the graft.  If there is a malfunction for any reason, your health care provider may remove and replace your access. Follow these instructions at home: Medicines  Take over-the-counter and prescription medicines only as told by your health care provider.  If you were prescribed an antibiotic medicine, use it as told by your health care provider. Do not stop using the antibiotic even if you start to feel better. Activity  Do not lift heavy things with the arm that has the access.  Try not to bump or hurt the arm that has the access. Lifestyle  Do not wear jewelry or tight clothing around the access.  Do not sleep by placing the access arm under your head or body. General instructions  Wash your hands with soap and water before touching the area around the  vascular access. If soap and water are not available, use hand sanitizer.  Keep all follow-up visits as told by your health care provider. This is very important. Any delay in follow-up could cause permanent dysfunction of the vascular access, which may be dangerous.  Do not measure your blood pressure on the arm that has the access.  Keep the access area clean and do not use makeup, creams, lotions, or ointments on it.  Check your access area every day for signs of infection. Check for: ? More redness,  swelling, or pain. ? More fluid or blood. ? Warmth. ? Pus or a bad smell. Contact a health care provider if:  Swelling around the vascular access gets worse.  You develop new pain. Get help right away if:  You have bleeding at the vascular access that cannot be easily controlled.  You have pain, numbness, an unusual pale skin color, or blue fingers or sores at the tips of your fingers in the hand on the side of your fistula.  You have chills.  You have a fever.  You have pus or other fluid (drainage) at the vascular access site.  You develop skin redness or red streaking on the skin around, above, or below the vascular access.  Your access is hot, swollen, red, and very painful.  You can suddenly see the cuff of your catheter used in the access. Summary  A vascular access is an entrance to your blood vessels that can be used for dialysis.  Several things can cause your vascular access to malfunction.  Treatment varies depending on the cause of the malfunction.  Keep all follow-up visits as told by your health care provider. Any delay in follow-up could cause permanent dysfunction of the vascular access, which may be dangerous. This information is not intended to replace advice given to you by your health care provider. Make sure you discuss any questions you have with your health care provider. Document Revised: 03/09/2018 Document Reviewed: 01/06/2017 Elsevier Patient Education  [place]. Moderate Conscious Sedation, Adult Sedation is the use of medicines to promote relaxation and relieve discomfort and anxiety. Moderate conscious sedation is a type of sedation. Under moderate conscious sedation, you are less alert than normal, but you are still able to respond to instructions, touch, or both. Moderate conscious sedation is used during short medical and dental procedures. It is milder than deep sedation, which is a type of sedation under which you cannot be easily  woken up. It is also milder than general anesthesia, which is the use of medicines to make you unconscious. Moderate conscious sedation allows you to return to your regular activities sooner. Tell a health care provider about:  Any allergies you have.  All medicines you are taking, including vitamins, herbs, eye drops, creams, and over-the-counter medicines.  Use of steroids (by mouth or creams).  Any problems you or family members have had with sedatives and anesthetic medicines.  Any blood disorders you have.  Any surgeries you have had.  Any medical conditions you have, such as sleep apnea.  Whether you are pregnant or may be pregnant.  Any use of cigarettes, alcohol, marijuana, or street drugs. What are the risks? Generally, this is a safe procedure. However, problems may occur, including:  Getting too much medicine (oversedation).  Nausea.  Allergic reaction to medicines.  Trouble breathing. If this happens, a breathing tube may be used to help with breathing. It will be removed when you are awake and breathing on  your own.  Heart trouble.  Lung trouble. What happens before the procedure? Staying hydrated Follow instructions from your health care provider about hydration, which may include:  Up to 2 hours before the procedure - you may continue to drink clear liquids, such as water, clear fruit juice, black coffee, and plain tea. Eating and drinking restrictions Follow instructions from your health care provider about eating and drinking, which may include:  8 hours before the procedure - stop eating heavy meals or foods such as meat, fried foods, or fatty foods.  6 hours before the procedure - stop eating light meals or foods, such as toast or cereal.  6 hours before the procedure - stop drinking milk or drinks that contain milk.  2 hours before the procedure - stop drinking clear liquids. Medicine Ask your health care provider about:  Changing or stopping  your regular medicines. This is especially important if you are taking diabetes medicines or blood thinners.  Taking medicines such as aspirin and ibuprofen. These medicines can thin your blood. Do not take these medicines before your procedure if your health care provider instructs you not to.  Tests and exams  You will have a physical exam.  You may have blood tests done to show: ? How well your kidneys and liver are working. ? How well your blood can clot. General instructions  Plan to have someone take you home from the hospital or clinic.  If you will be going home right after the procedure, plan to have someone with you for 24 hours. What happens during the procedure?  An IV tube will be inserted into one of your veins.  Medicine to help you relax (sedative) will be given through the IV tube.  The medical or dental procedure will be performed. What happens after the procedure?  Your blood pressure, heart rate, breathing rate, and blood oxygen level will be monitored often until the medicines you were given have worn off.  Do not drive for 24 hours. This information is not intended to replace advice given to you by your health care provider. Make sure you discuss any questions you have with your health care provider. Document Revised: 11/24/2017 Document Reviewed: 04/02/2016 Elsevier Patient Education  2020 [employer].

## 2020-10-24 NOTE — Sedation Documentation (Signed)
Discharge instructions reviewed. Stent cards given to pt. [place] CDI. Pt discharged via wheelchair to home, transported via personal vehicle by friend.

## 2020-10-26 DIAGNOSIS — I129 Hypertensive chronic kidney disease with stage 1 through stage 4 chronic kidney disease, or unspecified chronic kidney disease: Secondary | ICD-10-CM | POA: Diagnosis not present

## 2020-10-26 DIAGNOSIS — Z992 Dependence on renal dialysis: Secondary | ICD-10-CM | POA: Diagnosis not present

## 2020-10-26 DIAGNOSIS — D689 Coagulation defect, unspecified: Secondary | ICD-10-CM | POA: Diagnosis not present

## 2020-10-26 DIAGNOSIS — N2581 Secondary hyperparathyroidism of renal origin: Secondary | ICD-10-CM | POA: Diagnosis not present

## 2020-10-26 DIAGNOSIS — D631 Anemia in chronic kidney disease: Secondary | ICD-10-CM | POA: Diagnosis not present

## 2020-10-26 DIAGNOSIS — N186 End stage renal disease: Secondary | ICD-10-CM | POA: Diagnosis not present

## 2020-10-26 DIAGNOSIS — D509 Iron deficiency anemia, unspecified: Secondary | ICD-10-CM | POA: Diagnosis not present

## 2020-10-28 DIAGNOSIS — D689 Coagulation defect, unspecified: Secondary | ICD-10-CM | POA: Diagnosis not present

## 2020-10-28 DIAGNOSIS — Z992 Dependence on renal dialysis: Secondary | ICD-10-CM | POA: Diagnosis not present

## 2020-10-28 DIAGNOSIS — N186 End stage renal disease: Secondary | ICD-10-CM | POA: Diagnosis not present

## 2020-10-28 DIAGNOSIS — D631 Anemia in chronic kidney disease: Secondary | ICD-10-CM | POA: Diagnosis not present

## 2020-10-28 DIAGNOSIS — N2581 Secondary hyperparathyroidism of renal origin: Secondary | ICD-10-CM | POA: Diagnosis not present

## 2020-10-28 DIAGNOSIS — D509 Iron deficiency anemia, unspecified: Secondary | ICD-10-CM | POA: Diagnosis not present

## 2020-10-30 DIAGNOSIS — D509 Iron deficiency anemia, unspecified: Secondary | ICD-10-CM | POA: Diagnosis not present

## 2020-10-30 DIAGNOSIS — N186 End stage renal disease: Secondary | ICD-10-CM | POA: Diagnosis not present

## 2020-10-30 DIAGNOSIS — D689 Coagulation defect, unspecified: Secondary | ICD-10-CM | POA: Diagnosis not present

## 2020-10-30 DIAGNOSIS — Z992 Dependence on renal dialysis: Secondary | ICD-10-CM | POA: Diagnosis not present

## 2020-10-30 DIAGNOSIS — N2581 Secondary hyperparathyroidism of renal origin: Secondary | ICD-10-CM | POA: Diagnosis not present

## 2020-10-30 DIAGNOSIS — D631 Anemia in chronic kidney disease: Secondary | ICD-10-CM | POA: Diagnosis not present

## 2020-11-02 DIAGNOSIS — D509 Iron deficiency anemia, unspecified: Secondary | ICD-10-CM | POA: Diagnosis not present

## 2020-11-02 DIAGNOSIS — N186 End stage renal disease: Secondary | ICD-10-CM | POA: Diagnosis not present

## 2020-11-02 DIAGNOSIS — D631 Anemia in chronic kidney disease: Secondary | ICD-10-CM | POA: Diagnosis not present

## 2020-11-02 DIAGNOSIS — D689 Coagulation defect, unspecified: Secondary | ICD-10-CM | POA: Diagnosis not present

## 2020-11-02 DIAGNOSIS — N2581 Secondary hyperparathyroidism of renal origin: Secondary | ICD-10-CM | POA: Diagnosis not present

## 2020-11-02 DIAGNOSIS — Z992 Dependence on renal dialysis: Secondary | ICD-10-CM | POA: Diagnosis not present

## 2020-11-03 ENCOUNTER — Other Ambulatory Visit: Payer: Medicare Other

## 2020-11-04 DIAGNOSIS — D509 Iron deficiency anemia, unspecified: Secondary | ICD-10-CM | POA: Diagnosis not present

## 2020-11-04 DIAGNOSIS — D631 Anemia in chronic kidney disease: Secondary | ICD-10-CM | POA: Diagnosis not present

## 2020-11-04 DIAGNOSIS — D689 Coagulation defect, unspecified: Secondary | ICD-10-CM | POA: Diagnosis not present

## 2020-11-04 DIAGNOSIS — Z992 Dependence on renal dialysis: Secondary | ICD-10-CM | POA: Diagnosis not present

## 2020-11-04 DIAGNOSIS — N186 End stage renal disease: Secondary | ICD-10-CM | POA: Diagnosis not present

## 2020-11-04 DIAGNOSIS — N2581 Secondary hyperparathyroidism of renal origin: Secondary | ICD-10-CM | POA: Diagnosis not present

## 2020-11-06 DIAGNOSIS — Z992 Dependence on renal dialysis: Secondary | ICD-10-CM | POA: Diagnosis not present

## 2020-11-06 DIAGNOSIS — D689 Coagulation defect, unspecified: Secondary | ICD-10-CM | POA: Diagnosis not present

## 2020-11-06 DIAGNOSIS — D631 Anemia in chronic kidney disease: Secondary | ICD-10-CM | POA: Diagnosis not present

## 2020-11-06 DIAGNOSIS — N2581 Secondary hyperparathyroidism of renal origin: Secondary | ICD-10-CM | POA: Diagnosis not present

## 2020-11-06 DIAGNOSIS — N186 End stage renal disease: Secondary | ICD-10-CM | POA: Diagnosis not present

## 2020-11-06 DIAGNOSIS — D509 Iron deficiency anemia, unspecified: Secondary | ICD-10-CM | POA: Diagnosis not present

## 2020-11-09 DIAGNOSIS — Z992 Dependence on renal dialysis: Secondary | ICD-10-CM | POA: Diagnosis not present

## 2020-11-09 DIAGNOSIS — D509 Iron deficiency anemia, unspecified: Secondary | ICD-10-CM | POA: Diagnosis not present

## 2020-11-09 DIAGNOSIS — D631 Anemia in chronic kidney disease: Secondary | ICD-10-CM | POA: Diagnosis not present

## 2020-11-09 DIAGNOSIS — D689 Coagulation defect, unspecified: Secondary | ICD-10-CM | POA: Diagnosis not present

## 2020-11-09 DIAGNOSIS — N2581 Secondary hyperparathyroidism of renal origin: Secondary | ICD-10-CM | POA: Diagnosis not present

## 2020-11-09 DIAGNOSIS — N186 End stage renal disease: Secondary | ICD-10-CM | POA: Diagnosis not present

## 2020-11-11 DIAGNOSIS — N186 End stage renal disease: Secondary | ICD-10-CM | POA: Diagnosis not present

## 2020-11-11 DIAGNOSIS — Z992 Dependence on renal dialysis: Secondary | ICD-10-CM | POA: Diagnosis not present

## 2020-11-11 DIAGNOSIS — D689 Coagulation defect, unspecified: Secondary | ICD-10-CM | POA: Diagnosis not present

## 2020-11-11 DIAGNOSIS — D509 Iron deficiency anemia, unspecified: Secondary | ICD-10-CM | POA: Diagnosis not present

## 2020-11-11 DIAGNOSIS — N2581 Secondary hyperparathyroidism of renal origin: Secondary | ICD-10-CM | POA: Diagnosis not present

## 2020-11-11 DIAGNOSIS — D631 Anemia in chronic kidney disease: Secondary | ICD-10-CM | POA: Diagnosis not present

## 2020-11-13 DIAGNOSIS — N2581 Secondary hyperparathyroidism of renal origin: Secondary | ICD-10-CM | POA: Diagnosis not present

## 2020-11-13 DIAGNOSIS — N186 End stage renal disease: Secondary | ICD-10-CM | POA: Diagnosis not present

## 2020-11-13 DIAGNOSIS — D689 Coagulation defect, unspecified: Secondary | ICD-10-CM | POA: Diagnosis not present

## 2020-11-13 DIAGNOSIS — D631 Anemia in chronic kidney disease: Secondary | ICD-10-CM | POA: Diagnosis not present

## 2020-11-13 DIAGNOSIS — Z992 Dependence on renal dialysis: Secondary | ICD-10-CM | POA: Diagnosis not present

## 2020-11-13 DIAGNOSIS — D509 Iron deficiency anemia, unspecified: Secondary | ICD-10-CM | POA: Diagnosis not present

## 2020-11-17 DIAGNOSIS — N2581 Secondary hyperparathyroidism of renal origin: Secondary | ICD-10-CM | POA: Diagnosis not present

## 2020-11-17 DIAGNOSIS — Z992 Dependence on renal dialysis: Secondary | ICD-10-CM | POA: Diagnosis not present

## 2020-11-17 DIAGNOSIS — N186 End stage renal disease: Secondary | ICD-10-CM | POA: Diagnosis not present

## 2020-11-17 DIAGNOSIS — D689 Coagulation defect, unspecified: Secondary | ICD-10-CM | POA: Diagnosis not present

## 2020-11-17 DIAGNOSIS — D509 Iron deficiency anemia, unspecified: Secondary | ICD-10-CM | POA: Diagnosis not present

## 2020-11-17 DIAGNOSIS — D631 Anemia in chronic kidney disease: Secondary | ICD-10-CM | POA: Diagnosis not present

## 2020-11-20 DIAGNOSIS — D631 Anemia in chronic kidney disease: Secondary | ICD-10-CM | POA: Diagnosis not present

## 2020-11-20 DIAGNOSIS — N186 End stage renal disease: Secondary | ICD-10-CM | POA: Diagnosis not present

## 2020-11-20 DIAGNOSIS — D689 Coagulation defect, unspecified: Secondary | ICD-10-CM | POA: Diagnosis not present

## 2020-11-20 DIAGNOSIS — N2581 Secondary hyperparathyroidism of renal origin: Secondary | ICD-10-CM | POA: Diagnosis not present

## 2020-11-20 DIAGNOSIS — D509 Iron deficiency anemia, unspecified: Secondary | ICD-10-CM | POA: Diagnosis not present

## 2020-11-20 DIAGNOSIS — Z992 Dependence on renal dialysis: Secondary | ICD-10-CM | POA: Diagnosis not present

## 2020-11-23 DIAGNOSIS — D509 Iron deficiency anemia, unspecified: Secondary | ICD-10-CM | POA: Diagnosis not present

## 2020-11-23 DIAGNOSIS — N2581 Secondary hyperparathyroidism of renal origin: Secondary | ICD-10-CM | POA: Diagnosis not present

## 2020-11-23 DIAGNOSIS — D631 Anemia in chronic kidney disease: Secondary | ICD-10-CM | POA: Diagnosis not present

## 2020-11-23 DIAGNOSIS — D689 Coagulation defect, unspecified: Secondary | ICD-10-CM | POA: Diagnosis not present

## 2020-11-23 DIAGNOSIS — Z992 Dependence on renal dialysis: Secondary | ICD-10-CM | POA: Diagnosis not present

## 2020-11-23 DIAGNOSIS — N186 End stage renal disease: Secondary | ICD-10-CM | POA: Diagnosis not present

## 2020-11-24 DIAGNOSIS — Z992 Dependence on renal dialysis: Secondary | ICD-10-CM | POA: Diagnosis not present

## 2020-11-24 DIAGNOSIS — N186 End stage renal disease: Secondary | ICD-10-CM | POA: Diagnosis not present

## 2020-11-24 DIAGNOSIS — I129 Hypertensive chronic kidney disease with stage 1 through stage 4 chronic kidney disease, or unspecified chronic kidney disease: Secondary | ICD-10-CM | POA: Diagnosis not present

## 2020-11-25 DIAGNOSIS — D631 Anemia in chronic kidney disease: Secondary | ICD-10-CM | POA: Diagnosis not present

## 2020-11-25 DIAGNOSIS — R519 Headache, unspecified: Secondary | ICD-10-CM | POA: Diagnosis not present

## 2020-11-25 DIAGNOSIS — N2581 Secondary hyperparathyroidism of renal origin: Secondary | ICD-10-CM | POA: Diagnosis not present

## 2020-11-25 DIAGNOSIS — D509 Iron deficiency anemia, unspecified: Secondary | ICD-10-CM | POA: Diagnosis not present

## 2020-11-25 DIAGNOSIS — D689 Coagulation defect, unspecified: Secondary | ICD-10-CM | POA: Diagnosis not present

## 2020-11-25 DIAGNOSIS — N186 End stage renal disease: Secondary | ICD-10-CM | POA: Diagnosis not present

## 2020-11-25 DIAGNOSIS — Z992 Dependence on renal dialysis: Secondary | ICD-10-CM | POA: Diagnosis not present

## 2020-11-27 DIAGNOSIS — N2581 Secondary hyperparathyroidism of renal origin: Secondary | ICD-10-CM | POA: Diagnosis not present

## 2020-11-27 DIAGNOSIS — D631 Anemia in chronic kidney disease: Secondary | ICD-10-CM | POA: Diagnosis not present

## 2020-11-27 DIAGNOSIS — N186 End stage renal disease: Secondary | ICD-10-CM | POA: Diagnosis not present

## 2020-11-27 DIAGNOSIS — D509 Iron deficiency anemia, unspecified: Secondary | ICD-10-CM | POA: Diagnosis not present

## 2020-11-27 DIAGNOSIS — Z992 Dependence on renal dialysis: Secondary | ICD-10-CM | POA: Diagnosis not present

## 2020-11-27 DIAGNOSIS — D689 Coagulation defect, unspecified: Secondary | ICD-10-CM | POA: Diagnosis not present

## 2020-11-27 DIAGNOSIS — R519 Headache, unspecified: Secondary | ICD-10-CM | POA: Diagnosis not present

## 2020-12-01 ENCOUNTER — Other Ambulatory Visit: Payer: Medicare Other

## 2020-12-01 DIAGNOSIS — N186 End stage renal disease: Secondary | ICD-10-CM | POA: Diagnosis not present

## 2020-12-01 DIAGNOSIS — D631 Anemia in chronic kidney disease: Secondary | ICD-10-CM | POA: Diagnosis not present

## 2020-12-01 DIAGNOSIS — D509 Iron deficiency anemia, unspecified: Secondary | ICD-10-CM | POA: Diagnosis not present

## 2020-12-01 DIAGNOSIS — N2581 Secondary hyperparathyroidism of renal origin: Secondary | ICD-10-CM | POA: Diagnosis not present

## 2020-12-01 DIAGNOSIS — Z992 Dependence on renal dialysis: Secondary | ICD-10-CM | POA: Diagnosis not present

## 2020-12-01 DIAGNOSIS — D689 Coagulation defect, unspecified: Secondary | ICD-10-CM | POA: Diagnosis not present

## 2020-12-01 DIAGNOSIS — R519 Headache, unspecified: Secondary | ICD-10-CM | POA: Diagnosis not present

## 2020-12-02 DIAGNOSIS — N186 End stage renal disease: Secondary | ICD-10-CM | POA: Diagnosis not present

## 2020-12-02 DIAGNOSIS — D689 Coagulation defect, unspecified: Secondary | ICD-10-CM | POA: Diagnosis not present

## 2020-12-02 DIAGNOSIS — N2581 Secondary hyperparathyroidism of renal origin: Secondary | ICD-10-CM | POA: Diagnosis not present

## 2020-12-02 DIAGNOSIS — R519 Headache, unspecified: Secondary | ICD-10-CM | POA: Diagnosis not present

## 2020-12-02 DIAGNOSIS — Z992 Dependence on renal dialysis: Secondary | ICD-10-CM | POA: Diagnosis not present

## 2020-12-02 DIAGNOSIS — D631 Anemia in chronic kidney disease: Secondary | ICD-10-CM | POA: Diagnosis not present

## 2020-12-02 DIAGNOSIS — D509 Iron deficiency anemia, unspecified: Secondary | ICD-10-CM | POA: Diagnosis not present

## 2020-12-04 DIAGNOSIS — D631 Anemia in chronic kidney disease: Secondary | ICD-10-CM | POA: Diagnosis not present

## 2020-12-04 DIAGNOSIS — D509 Iron deficiency anemia, unspecified: Secondary | ICD-10-CM | POA: Diagnosis not present

## 2020-12-04 DIAGNOSIS — N2581 Secondary hyperparathyroidism of renal origin: Secondary | ICD-10-CM | POA: Diagnosis not present

## 2020-12-04 DIAGNOSIS — D689 Coagulation defect, unspecified: Secondary | ICD-10-CM | POA: Diagnosis not present

## 2020-12-04 DIAGNOSIS — Z992 Dependence on renal dialysis: Secondary | ICD-10-CM | POA: Diagnosis not present

## 2020-12-04 DIAGNOSIS — R519 Headache, unspecified: Secondary | ICD-10-CM | POA: Diagnosis not present

## 2020-12-04 DIAGNOSIS — N186 End stage renal disease: Secondary | ICD-10-CM | POA: Diagnosis not present

## 2020-12-07 DIAGNOSIS — R519 Headache, unspecified: Secondary | ICD-10-CM | POA: Diagnosis not present

## 2020-12-07 DIAGNOSIS — N186 End stage renal disease: Secondary | ICD-10-CM | POA: Diagnosis not present

## 2020-12-07 DIAGNOSIS — D631 Anemia in chronic kidney disease: Secondary | ICD-10-CM | POA: Diagnosis not present

## 2020-12-07 DIAGNOSIS — Z992 Dependence on renal dialysis: Secondary | ICD-10-CM | POA: Diagnosis not present

## 2020-12-07 DIAGNOSIS — N2581 Secondary hyperparathyroidism of renal origin: Secondary | ICD-10-CM | POA: Diagnosis not present

## 2020-12-07 DIAGNOSIS — D509 Iron deficiency anemia, unspecified: Secondary | ICD-10-CM | POA: Diagnosis not present

## 2020-12-07 DIAGNOSIS — D689 Coagulation defect, unspecified: Secondary | ICD-10-CM | POA: Diagnosis not present

## 2020-12-09 DIAGNOSIS — D509 Iron deficiency anemia, unspecified: Secondary | ICD-10-CM | POA: Diagnosis not present

## 2020-12-09 DIAGNOSIS — N186 End stage renal disease: Secondary | ICD-10-CM | POA: Diagnosis not present

## 2020-12-09 DIAGNOSIS — N2581 Secondary hyperparathyroidism of renal origin: Secondary | ICD-10-CM | POA: Diagnosis not present

## 2020-12-09 DIAGNOSIS — D631 Anemia in chronic kidney disease: Secondary | ICD-10-CM | POA: Diagnosis not present

## 2020-12-09 DIAGNOSIS — D689 Coagulation defect, unspecified: Secondary | ICD-10-CM | POA: Diagnosis not present

## 2020-12-09 DIAGNOSIS — R519 Headache, unspecified: Secondary | ICD-10-CM | POA: Diagnosis not present

## 2020-12-09 DIAGNOSIS — Z992 Dependence on renal dialysis: Secondary | ICD-10-CM | POA: Diagnosis not present

## 2020-12-11 DIAGNOSIS — R519 Headache, unspecified: Secondary | ICD-10-CM | POA: Diagnosis not present

## 2020-12-11 DIAGNOSIS — N2581 Secondary hyperparathyroidism of renal origin: Secondary | ICD-10-CM | POA: Diagnosis not present

## 2020-12-11 DIAGNOSIS — D631 Anemia in chronic kidney disease: Secondary | ICD-10-CM | POA: Diagnosis not present

## 2020-12-11 DIAGNOSIS — D689 Coagulation defect, unspecified: Secondary | ICD-10-CM | POA: Diagnosis not present

## 2020-12-11 DIAGNOSIS — N186 End stage renal disease: Secondary | ICD-10-CM | POA: Diagnosis not present

## 2020-12-11 DIAGNOSIS — D509 Iron deficiency anemia, unspecified: Secondary | ICD-10-CM | POA: Diagnosis not present

## 2020-12-11 DIAGNOSIS — Z992 Dependence on renal dialysis: Secondary | ICD-10-CM | POA: Diagnosis not present

## 2020-12-14 DIAGNOSIS — D509 Iron deficiency anemia, unspecified: Secondary | ICD-10-CM | POA: Diagnosis not present

## 2020-12-14 DIAGNOSIS — R519 Headache, unspecified: Secondary | ICD-10-CM | POA: Diagnosis not present

## 2020-12-14 DIAGNOSIS — D689 Coagulation defect, unspecified: Secondary | ICD-10-CM | POA: Diagnosis not present

## 2020-12-14 DIAGNOSIS — N2581 Secondary hyperparathyroidism of renal origin: Secondary | ICD-10-CM | POA: Diagnosis not present

## 2020-12-14 DIAGNOSIS — N186 End stage renal disease: Secondary | ICD-10-CM | POA: Diagnosis not present

## 2020-12-14 DIAGNOSIS — Z992 Dependence on renal dialysis: Secondary | ICD-10-CM | POA: Diagnosis not present

## 2020-12-14 DIAGNOSIS — D631 Anemia in chronic kidney disease: Secondary | ICD-10-CM | POA: Diagnosis not present

## 2020-12-16 DIAGNOSIS — D631 Anemia in chronic kidney disease: Secondary | ICD-10-CM | POA: Diagnosis not present

## 2020-12-16 DIAGNOSIS — D689 Coagulation defect, unspecified: Secondary | ICD-10-CM | POA: Diagnosis not present

## 2020-12-16 DIAGNOSIS — N2581 Secondary hyperparathyroidism of renal origin: Secondary | ICD-10-CM | POA: Diagnosis not present

## 2020-12-16 DIAGNOSIS — D509 Iron deficiency anemia, unspecified: Secondary | ICD-10-CM | POA: Diagnosis not present

## 2020-12-16 DIAGNOSIS — N186 End stage renal disease: Secondary | ICD-10-CM | POA: Diagnosis not present

## 2020-12-16 DIAGNOSIS — Z992 Dependence on renal dialysis: Secondary | ICD-10-CM | POA: Diagnosis not present

## 2020-12-16 DIAGNOSIS — R519 Headache, unspecified: Secondary | ICD-10-CM | POA: Diagnosis not present

## 2020-12-18 DIAGNOSIS — D689 Coagulation defect, unspecified: Secondary | ICD-10-CM | POA: Diagnosis not present

## 2020-12-18 DIAGNOSIS — R519 Headache, unspecified: Secondary | ICD-10-CM | POA: Diagnosis not present

## 2020-12-18 DIAGNOSIS — N186 End stage renal disease: Secondary | ICD-10-CM | POA: Diagnosis not present

## 2020-12-18 DIAGNOSIS — D631 Anemia in chronic kidney disease: Secondary | ICD-10-CM | POA: Diagnosis not present

## 2020-12-18 DIAGNOSIS — N2581 Secondary hyperparathyroidism of renal origin: Secondary | ICD-10-CM | POA: Diagnosis not present

## 2020-12-18 DIAGNOSIS — D509 Iron deficiency anemia, unspecified: Secondary | ICD-10-CM | POA: Diagnosis not present

## 2020-12-18 DIAGNOSIS — Z992 Dependence on renal dialysis: Secondary | ICD-10-CM | POA: Diagnosis not present

## 2020-12-23 DIAGNOSIS — D689 Coagulation defect, unspecified: Secondary | ICD-10-CM | POA: Diagnosis not present

## 2020-12-23 DIAGNOSIS — R519 Headache, unspecified: Secondary | ICD-10-CM | POA: Diagnosis not present

## 2020-12-23 DIAGNOSIS — D509 Iron deficiency anemia, unspecified: Secondary | ICD-10-CM | POA: Diagnosis not present

## 2020-12-23 DIAGNOSIS — N186 End stage renal disease: Secondary | ICD-10-CM | POA: Diagnosis not present

## 2020-12-23 DIAGNOSIS — N2581 Secondary hyperparathyroidism of renal origin: Secondary | ICD-10-CM | POA: Diagnosis not present

## 2020-12-23 DIAGNOSIS — Z992 Dependence on renal dialysis: Secondary | ICD-10-CM | POA: Diagnosis not present

## 2020-12-23 DIAGNOSIS — D631 Anemia in chronic kidney disease: Secondary | ICD-10-CM | POA: Diagnosis not present

## 2020-12-25 DIAGNOSIS — D509 Iron deficiency anemia, unspecified: Secondary | ICD-10-CM | POA: Diagnosis not present

## 2020-12-25 DIAGNOSIS — R519 Headache, unspecified: Secondary | ICD-10-CM | POA: Diagnosis not present

## 2020-12-25 DIAGNOSIS — I129 Hypertensive chronic kidney disease with stage 1 through stage 4 chronic kidney disease, or unspecified chronic kidney disease: Secondary | ICD-10-CM | POA: Diagnosis not present

## 2020-12-25 DIAGNOSIS — N2581 Secondary hyperparathyroidism of renal origin: Secondary | ICD-10-CM | POA: Diagnosis not present

## 2020-12-25 DIAGNOSIS — Z992 Dependence on renal dialysis: Secondary | ICD-10-CM | POA: Diagnosis not present

## 2020-12-25 DIAGNOSIS — D631 Anemia in chronic kidney disease: Secondary | ICD-10-CM | POA: Diagnosis not present

## 2020-12-25 DIAGNOSIS — D689 Coagulation defect, unspecified: Secondary | ICD-10-CM | POA: Diagnosis not present

## 2020-12-25 DIAGNOSIS — N186 End stage renal disease: Secondary | ICD-10-CM | POA: Diagnosis not present

## 2020-12-30 DIAGNOSIS — D631 Anemia in chronic kidney disease: Secondary | ICD-10-CM | POA: Diagnosis not present

## 2020-12-30 DIAGNOSIS — N186 End stage renal disease: Secondary | ICD-10-CM | POA: Diagnosis not present

## 2020-12-30 DIAGNOSIS — D509 Iron deficiency anemia, unspecified: Secondary | ICD-10-CM | POA: Diagnosis not present

## 2020-12-30 DIAGNOSIS — R519 Headache, unspecified: Secondary | ICD-10-CM | POA: Diagnosis not present

## 2020-12-30 DIAGNOSIS — N2581 Secondary hyperparathyroidism of renal origin: Secondary | ICD-10-CM | POA: Diagnosis not present

## 2020-12-30 DIAGNOSIS — I151 Hypertension secondary to other renal disorders: Secondary | ICD-10-CM | POA: Diagnosis not present

## 2020-12-30 DIAGNOSIS — Z992 Dependence on renal dialysis: Secondary | ICD-10-CM | POA: Diagnosis not present

## 2020-12-30 DIAGNOSIS — D689 Coagulation defect, unspecified: Secondary | ICD-10-CM | POA: Diagnosis not present

## 2021-01-01 DIAGNOSIS — Z992 Dependence on renal dialysis: Secondary | ICD-10-CM | POA: Diagnosis not present

## 2021-01-01 DIAGNOSIS — D509 Iron deficiency anemia, unspecified: Secondary | ICD-10-CM | POA: Diagnosis not present

## 2021-01-01 DIAGNOSIS — N2581 Secondary hyperparathyroidism of renal origin: Secondary | ICD-10-CM | POA: Diagnosis not present

## 2021-01-01 DIAGNOSIS — D631 Anemia in chronic kidney disease: Secondary | ICD-10-CM | POA: Diagnosis not present

## 2021-01-01 DIAGNOSIS — D689 Coagulation defect, unspecified: Secondary | ICD-10-CM | POA: Diagnosis not present

## 2021-01-01 DIAGNOSIS — I151 Hypertension secondary to other renal disorders: Secondary | ICD-10-CM | POA: Diagnosis not present

## 2021-01-01 DIAGNOSIS — N186 End stage renal disease: Secondary | ICD-10-CM | POA: Diagnosis not present

## 2021-01-01 DIAGNOSIS — R519 Headache, unspecified: Secondary | ICD-10-CM | POA: Diagnosis not present

## 2021-01-04 ENCOUNTER — Emergency Department (HOSPITAL_COMMUNITY)
Admission: EM | Admit: 2021-01-04 | Discharge: 2021-01-05 | Disposition: A | Discharge status: Home / Self Care | Payer: Medicare HMO | Attending: Emergency Medicine | Admitting: Emergency Medicine

## 2021-01-04 ENCOUNTER — Other Ambulatory Visit: Payer: Self-pay

## 2021-01-04 ENCOUNTER — Encounter (HOSPITAL_COMMUNITY): Payer: Self-pay

## 2021-01-04 DIAGNOSIS — N2581 Secondary hyperparathyroidism of renal origin: Secondary | ICD-10-CM | POA: Diagnosis not present

## 2021-01-04 DIAGNOSIS — I120 Hypertensive chronic kidney disease with stage 5 chronic kidney disease or end stage renal disease: Secondary | ICD-10-CM | POA: Insufficient documentation

## 2021-01-04 DIAGNOSIS — D638 Anemia in other chronic diseases classified elsewhere: Secondary | ICD-10-CM | POA: Diagnosis not present

## 2021-01-04 DIAGNOSIS — N63 Unspecified lump in unspecified breast: Secondary | ICD-10-CM

## 2021-01-04 DIAGNOSIS — E669 Obesity, unspecified: Secondary | ICD-10-CM | POA: Diagnosis not present

## 2021-01-04 DIAGNOSIS — R519 Headache, unspecified: Secondary | ICD-10-CM | POA: Diagnosis not present

## 2021-01-04 DIAGNOSIS — N186 End stage renal disease: Secondary | ICD-10-CM | POA: Diagnosis not present

## 2021-01-04 DIAGNOSIS — N6320 Unspecified lump in the left breast, unspecified quadrant: Secondary | ICD-10-CM | POA: Diagnosis not present

## 2021-01-04 DIAGNOSIS — R222 Localized swelling, mass and lump, trunk: Secondary | ICD-10-CM | POA: Diagnosis not present

## 2021-01-04 DIAGNOSIS — D509 Iron deficiency anemia, unspecified: Secondary | ICD-10-CM | POA: Diagnosis not present

## 2021-01-04 DIAGNOSIS — N611 Abscess of the breast and nipple: Secondary | ICD-10-CM | POA: Diagnosis not present

## 2021-01-04 DIAGNOSIS — N6002 Solitary cyst of left breast: Secondary | ICD-10-CM | POA: Diagnosis not present

## 2021-01-04 DIAGNOSIS — L539 Erythematous condition, unspecified: Secondary | ICD-10-CM | POA: Diagnosis not present

## 2021-01-04 DIAGNOSIS — I151 Hypertension secondary to other renal disorders: Secondary | ICD-10-CM | POA: Diagnosis not present

## 2021-01-04 DIAGNOSIS — Z87891 Personal history of nicotine dependence: Secondary | ICD-10-CM | POA: Insufficient documentation

## 2021-01-04 DIAGNOSIS — Z992 Dependence on renal dialysis: Secondary | ICD-10-CM | POA: Diagnosis not present

## 2021-01-04 DIAGNOSIS — I739 Peripheral vascular disease, unspecified: Secondary | ICD-10-CM | POA: Diagnosis not present

## 2021-01-04 DIAGNOSIS — Z79899 Other long term (current) drug therapy: Secondary | ICD-10-CM | POA: Diagnosis not present

## 2021-01-04 DIAGNOSIS — D689 Coagulation defect, unspecified: Secondary | ICD-10-CM | POA: Diagnosis not present

## 2021-01-04 DIAGNOSIS — I10 Essential (primary) hypertension: Secondary | ICD-10-CM | POA: Diagnosis not present

## 2021-01-04 DIAGNOSIS — R590 Localized enlarged lymph nodes: Secondary | ICD-10-CM | POA: Diagnosis not present

## 2021-01-04 DIAGNOSIS — D631 Anemia in chronic kidney disease: Secondary | ICD-10-CM | POA: Diagnosis not present

## 2021-01-04 DIAGNOSIS — G894 Chronic pain syndrome: Secondary | ICD-10-CM | POA: Diagnosis not present

## 2021-01-04 NOTE — ED Triage Notes (Signed)
Patient with left breast pain. States that she was sent from her MD for possible abscess to same. denis drainage

## 2021-01-05 ENCOUNTER — Emergency Department (HOSPITAL_COMMUNITY): Payer: Medicare HMO

## 2021-01-05 DIAGNOSIS — N6320 Unspecified lump in the left breast, unspecified quadrant: Secondary | ICD-10-CM | POA: Diagnosis not present

## 2021-01-05 DIAGNOSIS — N6002 Solitary cyst of left breast: Secondary | ICD-10-CM | POA: Diagnosis not present

## 2021-01-05 DIAGNOSIS — R590 Localized enlarged lymph nodes: Secondary | ICD-10-CM | POA: Diagnosis not present

## 2021-01-05 LAB — COMPREHENSIVE METABOLIC PANEL WITH GFR
ALT: 9 U/L (ref 0–44)
AST: 13 U/L — ABNORMAL LOW (ref 15–41)
Albumin: 2.9 g/dL — ABNORMAL LOW (ref 3.5–5.0)
Alkaline Phosphatase: 47 U/L (ref 38–126)
Anion gap: 16 — ABNORMAL HIGH (ref 5–15)
BUN: 21 mg/dL — ABNORMAL HIGH (ref 6–20)
CO2: 27 mmol/L (ref 22–32)
Calcium: 8.5 mg/dL — ABNORMAL LOW (ref 8.9–10.3)
Chloride: 95 mmol/L — ABNORMAL LOW (ref 98–111)
Creatinine, Ser: 8.92 mg/dL — ABNORMAL HIGH (ref 0.44–1.00)
GFR, Estimated: 5 mL/min — ABNORMAL LOW (ref >60)
Glucose, Bld: 84 mg/dL (ref 70–99)
Potassium: 3.8 mmol/L (ref 3.5–5.1)
Sodium: 138 mmol/L (ref 135–145)
Total Bilirubin: 0.9 mg/dL (ref 0.3–1.2)
Total Protein: 7.1 g/dL (ref 6.5–8.1)

## 2021-01-05 LAB — CBC WITH DIFFERENTIAL/PLATELET
Abs Immature Granulocytes: 0.09 K/uL — ABNORMAL HIGH (ref 0.00–0.07)
Basophils Absolute: 0 K/uL (ref 0.0–0.1)
Basophils Relative: 0 %
Eosinophils Absolute: 0.1 K/uL (ref 0.0–0.5)
Eosinophils Relative: 1 %
HCT: 33.4 % — ABNORMAL LOW (ref 36.0–46.0)
Hemoglobin: 10.5 g/dL — ABNORMAL LOW (ref 12.0–15.0)
Immature Granulocytes: 1 %
Lymphocytes Relative: 10 %
Lymphs Abs: 0.9 K/uL (ref 0.7–4.0)
MCH: 31.4 pg (ref 26.0–34.0)
MCHC: 31.4 g/dL (ref 30.0–36.0)
MCV: 100 fL (ref 80.0–100.0)
Monocytes Absolute: 0.4 K/uL (ref 0.1–1.0)
Monocytes Relative: 5 %
Neutro Abs: 7.4 K/uL (ref 1.7–7.7)
Neutrophils Relative %: 83 %
Platelets: 267 K/uL (ref 150–400)
RBC: 3.34 MIL/uL — ABNORMAL LOW (ref 3.87–5.11)
RDW: 16.7 % — ABNORMAL HIGH (ref 11.5–15.5)
WBC: 8.8 K/uL (ref 4.0–10.5)
nRBC: 0 % (ref 0.0–0.2)

## 2021-01-05 MED ORDER — LOPERAMIDE HCL 2 MG PO CAPS
2.0000 mg | ORAL_CAPSULE | Freq: Once | ORAL | Status: AC
  Administered 2021-01-05: 2 mg via ORAL
  Filled 2021-01-05: qty 1

## 2021-01-05 MED ORDER — AMOXICILLIN-POT CLAVULANATE 875-125 MG PO TABS: 1.0000 | ORAL_TABLET | Freq: Two times a day (BID) | ORAL | 0 refills | Status: DC

## 2021-01-05 MED ORDER — CLINDAMYCIN PHOSPHATE 600 MG/50ML IV SOLN
600.0000 mg | Freq: Once | INTRAVENOUS | Status: AC
  Administered 2021-01-05: 600 mg via INTRAVENOUS
  Filled 2021-01-05: qty 50

## 2021-01-05 MED ORDER — IOHEXOL 300 MG/ML  SOLN
100.0000 mL | Freq: Once | INTRAMUSCULAR | Status: AC | PRN
  Administered 2021-01-05: 100 mL via INTRAVENOUS

## 2021-01-05 NOTE — ED Notes (Signed)
Patient transported to CT 

## 2021-01-05 NOTE — Social Work (Signed)
CSW met with Pt at bedside. Pt needs assistance with transportation home. Pt is leagally blind and will need assistance getting from vehicle to front porch. Due to this particular need, CSW opted for taxi voucher over [place] as [employer] could assure assistance.

## 2021-01-05 NOTE — ED Provider Notes (Signed)
Care assumed from Suella Broad, PA-C. See her note for full H&P.   Per her note, " 46 year old female with history of end-stage renal disease (attends dialysis Monday, Wednesday, Friday), hypertension, peripheral artery disease presents with complaint of left breast abscess.  Patient states that she has had left breast pain for the past week, has been applying warm compresses without improvement in symptoms. Denies fevers. No prior mammogram, no other complaints or concerns. "   Physical Exam  BP (!) 138/102   Pulse 82   Temp 98.4 F (36.9 C) (Oral)   Resp 15   SpO2 97%   Physical Exam   ED Course/Procedures   Clinical Course as of 01/05/21 1532  Tue Jan 05, 2539  [id number] 45 year old female with left breast pain for a week not improving with warm compresses.  On exam has extremely tender swollen left left breast with induration.  At least upper half of the breast, is warm to the touch although no obvious area of fluctuance, no drainage, nipple is not inverted.  No easily palpable axillary lymph nodes appreciated. White blood cell count within normal limits, CMP without significant changes in this patient with known chronic kidney disease on dialysis. CT concerning for mass versus abscess, recommends ultrasound to further evaluate area. Case discussed with [facility] with general surgery, protocol is for IR to aspirate area with intact skin.  IR paged for consult, ultrasound of breast and aspiration ordered. [LM]  1502 Case discussed with IR however IR does not drain breast abscesses and defers to care through the breast center.  Case again discussed with general surgery, recommends Augmentin and follow-up with the breast center.  Contact to the breast center who is unable to schedule patient for an appointment while she is in the emergency room and requires referral from her PCP.  Pending callback from general surgery to facilitate patient's care. [LM]    Clinical Course User Index [LM] Tacy Learn, PA-C    Procedures Results for orders placed or performed during the hospital encounter of 01/04/21  Comprehensive metabolic panel  Result Value Ref Range   Sodium 138 135 - 145 mmol/L   Potassium 3.8 3.5 - 5.1 mmol/L   Chloride 95 (L) 98 - 111 mmol/L   CO2 27 22 - 32 mmol/L   Glucose, Bld 84 70 - 99 mg/dL   BUN 21 (H) 6 - 20 mg/dL   Creatinine, Ser 8.92 (H) 0.44 - 1.00 mg/dL   Calcium 8.5 (L) 8.9 - 10.3 mg/dL   Total Protein 7.1 6.5 - 8.1 g/dL   Albumin 2.9 (L) 3.5 - 5.0 g/dL   AST 13 (L) 15 - 41 U/L   ALT 9 0 - 44 U/L   Alkaline Phosphatase 47 38 - 126 U/L   Total Bilirubin 0.9 0.3 - 1.2 mg/dL   GFR, Estimated 5 (L) >60 mL/min   Anion gap 16 (H) 5 - 15  CBC with Differential  Result Value Ref Range   WBC 8.8 4.0 - 10.5 K/uL   RBC 3.34 (L) 3.87 - 5.11 MIL/uL   Hemoglobin 10.5 (L) 12.0 - 15.0 g/dL   HCT 33.4 (L) 36.0 - 46.0 %   MCV 100.0 80.0 - 100.0 fL   MCH 31.4 26.0 - 34.0 pg   MCHC 31.4 30.0 - 36.0 g/dL   RDW 16.7 (H) 11.5 - 15.5 %   Platelets 267 150 - 400 K/uL   nRBC 0.0 0.0 - 0.2 %   Neutrophils Relative % 83 %  Neutro Abs 7.4 1.7 - 7.7 K/uL   Lymphocytes Relative 10 %   Lymphs Abs 0.9 0.7 - 4.0 K/uL   Monocytes Relative 5 %   Monocytes Absolute 0.4 0.1 - 1.0 K/uL   Eosinophils Relative 1 %   Eosinophils Absolute 0.1 0.0 - 0.5 K/uL   Basophils Relative 0 %   Basophils Absolute 0.0 0.0 - 0.1 K/uL   Immature Granulocytes 1 %   Abs Immature Granulocytes 0.09 (H) 0.00 - 0.07 K/uL   CT Chest W Contrast  Result Date: 01/05/2021 CLINICAL DATA:  Soft tissue mass, breast mass versus infection EXAM: CT CHEST WITH CONTRAST TECHNIQUE: Multidetector CT imaging of the chest was performed during intravenous contrast administration. CONTRAST:  127mL OMNIPAQUE IOHEXOL 300 MG/ML  SOLN COMPARISON:  None. FINDINGS: Cardiovascular: Aortic atherosclerosis. Normal heart size. Three-vessel coronary artery calcifications and/or stents. No pericardial effusion. The central  superior vena cava is narrowed and effaced with intraluminal calcifications and extensive superficial vascular collateralization about the chest wall (series 3, image 43, series 6, image 75). Mediastinum/Nodes: No enlarged mediastinal or hilar lymph nodes. Enlarged left axillary and subpectoral lymph nodes measuring up to 2.0 x 1.2 cm (series 3, image 25). Thyroid gland, trachea, and esophagus demonstrate no significant findings. Lungs/Pleura: Lungs are clear. No pleural effusion or pneumothorax. Upper Abdomen: No acute abnormality. Atrophic appearance of the partially included kidneys in the upper abdomen. Musculoskeletal: There is extensive subcutaneous fat stranding and skin thickening of the left breast. There is a internally hypoattenuating mass or fluid collection in the subareolar left breast measuring 7.3 x 7.0 x 4.8 cm (series 3, image 69, series 6, image 37). IMPRESSION: 1. There is extensive subcutaneous fat stranding and skin thickening of the left breast. There is a internally hypoattenuating mass or fluid collection in the subareolar left breast measuring 7.3 x 7.0 x 4.8 cm. Enlarged left axillary and subpectoral lymph nodes. This may reflect a benign breast abscess depending upon clinical presentation, but locally advanced malignancy with involvement of the skin and lymph nodes would be difficult to confidently distinguish based on CT. Recommend initial targeted ultrasound to assess for drainable fluid collection. 2. The central superior vena cava is narrowed and effaced with intraluminal calcifications and extensive superficial vascular collateralization about the chest wall, generally suggestive chronic central venous stenosis secondary to prior indwelling catheter access. 3. Coronary artery disease and aortic atherosclerosis, markedly advanced for patient age. Aortic Atherosclerosis (ICD10-I70.0). Electronically Signed   By: Eddie Candle M.D.   On: 01/05/2021 14:00   US BREAST COMPLETE UNI LEFT  INC AXILLA  Result Date: 01/05/2021 CLINICAL DATA:  Follow-up abnormal CT chest. EXAM: ULTRASOUND OF THE LEFT BREAST COMPARISON:  CT 01/05/2021. FINDINGS: Left breast ultrasound reveals a 6.2 x 4.5 x 7.2 cm complex cystic mass. Although tumor cannot be excluded, abscess and or hematoma would be more likely. Close follow-up exam suggested to demonstrate complete resolution in order to exclude a tumor. IMPRESSION: Left breast soft tissue edema. Left breast complex cystic mass. Although tumor cannot be excluded, abscess and or hematoma would be more likely. Aspiration of the left breast cystic mass should be considered. RECOMMENDATION: Aspiration of the left breast cystic mass should be considered. Close follow-up exams suggested to demonstrate complete resolution in order to exclude a tumor. I BI-RADS CATEGORY  BI-RADS category 4. Electronically Signed   By: Marcello Moores  Register   On: 01/05/2021 15:57     MDM   45 y/o F presenting for eval of left breast swelling.  US showed mass vs abscess and CT scan was ordered and pending at the time of shift change.   At shift change, also pending callback from general surgery for recommendations on f/u for patient.   CT scan showed -  1. There is extensive subcutaneous fat stranding and skin thickening of the left breast. There is a internally hypoattenuating mass or fluid collection in the subareolar left breast measuring 7.3 x 7.0 x 4.8 cm. Enlarged left axillary and subpectoral lymph nodes. This may reflect a benign breast abscess depending upon clinical presentation, but locally advanced malignancy with involvement of the skin and lymph nodes would be difficult to confidently distinguish based on CT. Recommend initial targeted ultrasound to assess for drainable fluid collection. 2. The central superior vena cava is narrowed and effaced with intraluminal calcifications and extensive superficial vascular collateralization about the chest wall, generally suggestive  chronic central venous stenosis secondary to prior indwelling catheter access. 3. Coronary artery disease and aortic atherosclerosis, markedly advanced for patient age. Aortic Atherosclerosis  4:22 PM Discussed case with Claiborne Billings, general surgery APP. She has discussed case with staff at the breast center and is assisting in organizing a f/u appt with the patient. They have provided recommendations and f/u imaging that the patient will require. they recommend d/c on Augmentin.   Discussed case with Mariann Laster from [facility] regarding transportation to appt at the [facility]. She will confirm that pt has ability to get to appt later this week for f/u.   Have discussed plan for f/u with patient and plan for d/c on abx. All questions were answered and pt stable for d/c.      Bishop Dublin 01/05/21 [place], MD 01/05/21 2028

## 2021-01-05 NOTE — Progress Notes (Signed)
Case discussed with EDPA.  I have contacted the breast center and discussed her imaging with Dr. Owens Shark.  I have given the patient a WRITTEN prescription for diagnostic Bilateral mammograms as well as a diagnostic L breast US with aspiration vs biopsy.  The patient is aware that this has to be taken with her to her appointment.  They will contact the patient tomorrow to arrange an appointment time around her HD schedule.  The EDPA is discharging the patient home on augmentin BID for 7 days for now.  They are also going to contact social work to see if they can help arrange transportation for the patient given her blindness and other medical issues that make follow up difficult.  She will be referred to [facility] as needed per the breast center.    Rebekah Burton 4:36 PM 01/05/2021

## 2021-01-05 NOTE — ED Provider Notes (Signed)
[facility] EMERGENCY DEPARTMENT Provider Note   CSN: [id number] Arrival date & time: 01/04/21  1557     History No chief complaint on file.   Rebekah Burton is a 45 y.o. female.  45 year old female with history of end-stage renal disease (attends dialysis Monday, Wednesday, Friday), hypertension, peripheral artery disease presents with complaint of left breast abscess.  Patient states that she has had left breast pain for the past week, has been applying warm compresses without improvement in symptoms. Denies fevers. No prior mammogram, no other complaints or concerns.         Past Medical History:  Diagnosis Date  . Blind   . ESRD (end stage renal disease) ([place])   . Foot ulceration ([place]) 12/2018  . Hypertension   . PAD (peripheral artery disease) [facility])     Patient Active Problem List   Diagnosis Date Noted  . Cellulitis of left lower extremity 02/05/2019  . Post-operative pain   . Acute blood loss anemia   . S/P transmetatarsal amputation of foot, left ([place])   . Ischemic ulcer of left foot ([place]) 01/16/2019  . ESRD (end stage renal disease) ([place]) 01/16/2019  . HTN (hypertension) 01/16/2019  . 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  . PAD (peripheral artery disease) ([place]) 01/16/2019    Past Surgical History:  Procedure Laterality Date  . ABDOMINAL AORTOGRAM W/LOWER EXTREMITY Bilateral 01/17/2019   Procedure: ABDOMINAL AORTOGRAM W/LOWER EXTREMITY;  Surgeon: Waynetta Sandy, MD;  Location: [facility];  Service: Cardiovascular;  Laterality: Bilateral;  . ABDOMINAL HYSTERECTOMY    . ENDARTERECTOMY FEMORAL Left 01/18/2019   Procedure: ENDARTERECTOMY FEMORAL with Perfundaplasty;  Surgeon: Rosetta Posner, MD;  Location: [facility];  Service: Vascular;  Laterality: Left;  . FEMORAL-POPLITEAL BYPASS GRAFT Left 01/18/2019   Procedure: Left  FEMORAL- to  Below the Knee POPLITEAL ARTERY bypass using reversed safenous vein.;  Surgeon: Rosetta Posner, MD;   Location: [facility] OR;  Service: Vascular;  Laterality: Left;  . IR THROMBECTOMY AV FISTULA W/THROMBOLYSIS/PTA/STENT INC/SHUNT/IMG LT Left 10/23/2020  . toe removal Right    All toes on right foot have been removed.   . TRANSMETATARSAL AMPUTATION Left 01/18/2019   Procedure: TRANSMETATARSAL AMPUTATION;  Surgeon: Rosetta Posner, MD;  Location: [facility];  Service: Vascular;  Laterality: Left;     OB History   No obstetric history on file.     Family History  Problem Relation Age of Onset  . Peripheral Artery Disease Neg Hx   . Kidney failure Neg Hx     Social History   Tobacco Use  . Smoking status: Former Smoker    Packs/day: 0.50    Years: 20.00    Pack years: 10.00    Types: Cigarettes    Quit date: 01/14/2019    Years since quitting: 1.9  . Smokeless tobacco: Never Used  . Tobacco comment: <1 PPD  Vaping Use  . Vaping Use: Never used  Substance Use Topics  . Alcohol use: Never  . Drug use: Never    Home Medications Prior to Admission medications   Medication Sig Start Date End Date Taking? Authorizing Provider  amoxicillin-clavulanate (AUGMENTIN) 875-125 MG tablet Take 1 tablet by mouth every 12 (twelve) hours. 01/05/21  Yes Tacy Learn, PA-C  b complex-vitamin c-folic acid (NEPHRO-VITE) 0.8 MG TABS tablet Take 1 tablet by mouth every Monday, Wednesday, and Friday with hemodialysis. 11/20/18  Yes [provider]  carvedilol (COREG) 25 MG tablet Take 25 mg by  mouth 2 (two) times daily. 01/04/19  Yes [provider]  cloNIDine (CATAPRES) 0.3 MG tablet Take 0.3 mg by mouth 3 (three) times daily. 01/01/21  Yes [provider]  lidocaine-prilocaine (EMLA) cream Apply 1 application topically every Monday, Wednesday, and Friday with hemodialysis.  11/20/18  Yes [provider]  RENVELA 800 MG tablet Take 800 mg by mouth See admin instructions. 4 tabs tid w/meals 11/20/18  Yes [provider]  oxyCODONE (OXY IR/ROXICODONE) 5 MG immediate  release tablet Take 1 tablet (5 mg total) by mouth every 4 (four) hours as needed for moderate pain. Patient not taking: No sig reported 02/09/19   Elgergawy, Silver Huguenin, MD  traMADol (ULTRAM) 50 MG tablet Take 1 tablet (50 mg total) by mouth every 6 (six) hours as needed for moderate pain. Patient not taking: No sig reported 02/09/19   Elgergawy, Silver Huguenin, MD    Allergies    Percocet [oxycodone-acetaminophen]  Review of Systems   Review of Systems  Constitutional: Negative for fever.  Respiratory: Negative for shortness of breath.   Cardiovascular: Negative for chest pain.  Gastrointestinal: Negative for abdominal pain, nausea and vomiting.  Skin: Positive for color change. Negative for wound.  Allergic/Immunologic: Positive for immunocompromised state.  Neurological: Negative for weakness and headaches.  Hematological: Negative for adenopathy.  All other systems reviewed and are negative.   Physical Exam Updated Vital Signs BP (!) 154/108   Pulse 84   Temp 97.6 F (36.4 C) (Tympanic)   Resp (!) 21   SpO2 96%   Physical Exam Vitals and nursing note reviewed.  Constitutional:      General: She is not in acute distress.    Appearance: She is well-developed and well-nourished. She is obese. She is not diaphoretic.  HENT:     Head: Normocephalic and atraumatic.  Pulmonary:     Effort: Pulmonary effort is normal.  Chest:    Skin:    General: Skin is warm.     Findings: Erythema present.  Neurological:     Mental Status: She is alert and oriented to person, place, and time.  Psychiatric:        Mood and Affect: Mood and affect normal.        Behavior: Behavior normal.     ED Results / Procedures / Treatments   Labs (all labs ordered are listed, but only abnormal results are displayed) Labs Reviewed  COMPREHENSIVE METABOLIC PANEL - Abnormal; Notable for the following components:      Result Value   Chloride 95 (*)    BUN 21 (*)    Creatinine, Ser 8.92 (*)     Calcium 8.5 (*)    Albumin 2.9 (*)    AST 13 (*)    GFR, Estimated 5 (*)    Anion gap 16 (*)    All other components within normal limits  CBC WITH DIFFERENTIAL/PLATELET - Abnormal; Notable for the following components:   RBC 3.34 (*)    Hemoglobin 10.5 (*)    HCT 33.4 (*)    RDW 16.7 (*)    Abs Immature Granulocytes 0.09 (*)    All other components within normal limits    EKG None  Radiology No results found.  Procedures Procedures (including critical care time)  Medications Ordered in ED Medications  clindamycin (CLEOCIN) IVPB 600 mg (0 mg Intravenous Stopped 01/05/21 1449)  iohexol (OMNIPAQUE) 300 MG/ML solution 100 mL (100 mLs Intravenous Contrast Given 01/05/21 1345)  loperamide (IMODIUM) capsule 2 mg (  2 mg Oral Given 01/05/21 1800)    ED Course  I have reviewed the triage vital signs and the nursing notes.  Pertinent labs & imaging results that were available during my care of the patient were reviewed by me and considered in my medical decision making (see chart for details).  Clinical Course as of 01/08/21 1600  Tue Jan 06, 5032  832 45 year old female with left breast pain for a week not improving with warm compresses.  On exam has extremely tender swollen left left breast with induration.  At least upper half of the breast, is warm to the touch although no obvious area of fluctuance, no drainage, nipple is not inverted.  No easily palpable axillary lymph nodes appreciated. White blood cell count within normal limits, CMP without significant changes in this patient with known chronic kidney disease on dialysis. CT concerning for mass versus abscess, recommends ultrasound to further evaluate area. Case discussed with [facility] with general surgery, protocol is for IR to aspirate area with intact skin.  IR paged for consult, ultrasound of breast and aspiration ordered. [LM]  1502 Case discussed with IR however IR does not drain breast abscesses and defers to care through the  breast center.  Case again discussed with general surgery, recommends Augmentin and follow-up with the breast center.  Contact to the breast center who is unable to schedule patient for an appointment while she is in the emergency room and requires referral from her PCP.  Pending callback from general surgery to facilitate patient's care. [LM]    Clinical Course User Index [LM] Roque Lias   MDM Rules/Calculators/A&P                          Final Clinical Impression(s) / ED Diagnoses Final diagnoses:  Breast mass in female  Mass in chest    Rx / DC Orders ED Discharge Orders         Ordered    amoxicillin-clavulanate (AUGMENTIN) 875-125 MG tablet  Every 12 hours        01/05/21 1532           Tacy Learn, PA-C 01/08/21 1600    Lucrezia Starch, MD 01/10/21 [phone]

## 2021-01-05 NOTE — Progress Notes (Deleted)
   01/05/21 1803  TOC ED Mini Assessment  TOC Time spent with patient (minutes): 25  PING Used in TOC Assessment No  Admission or Readmission Diverted Yes  What brought you to the Emergency Department?  r/o stroke symptoms  Barrier interventions ED CM met with patient and spouse to discuss recommendations for OP PT services patient and wife are agreeable.  Patient resides in [place] discussed [place] patient is qgreeable and prefers the [facility], Referral faxed to [place] office [phone]. Patient made aware that the scheduler should contact them at number listed in record to arrange appointment. Verbalized understanding.  No further TOC needs identified

## 2021-01-05 NOTE — Discharge Instructions (Addendum)
You were given a prescription for antibiotics. Please take the antibiotic prescription fully.   The breast center will contact you to schedule an appointment for follow up later this week.   Please return to the emergency department for any new or worsening symptoms.

## 2021-01-05 NOTE — ED Notes (Signed)
Patient transported to ultrasound.

## 2021-01-06 DIAGNOSIS — N186 End stage renal disease: Secondary | ICD-10-CM | POA: Diagnosis not present

## 2021-01-06 DIAGNOSIS — I151 Hypertension secondary to other renal disorders: Secondary | ICD-10-CM | POA: Diagnosis not present

## 2021-01-06 DIAGNOSIS — Z992 Dependence on renal dialysis: Secondary | ICD-10-CM | POA: Diagnosis not present

## 2021-01-06 DIAGNOSIS — N2581 Secondary hyperparathyroidism of renal origin: Secondary | ICD-10-CM | POA: Diagnosis not present

## 2021-01-06 DIAGNOSIS — R519 Headache, unspecified: Secondary | ICD-10-CM | POA: Diagnosis not present

## 2021-01-06 DIAGNOSIS — D689 Coagulation defect, unspecified: Secondary | ICD-10-CM | POA: Diagnosis not present

## 2021-01-06 DIAGNOSIS — D509 Iron deficiency anemia, unspecified: Secondary | ICD-10-CM | POA: Diagnosis not present

## 2021-01-06 DIAGNOSIS — D631 Anemia in chronic kidney disease: Secondary | ICD-10-CM | POA: Diagnosis not present

## 2021-01-08 DIAGNOSIS — D689 Coagulation defect, unspecified: Secondary | ICD-10-CM | POA: Diagnosis not present

## 2021-01-08 DIAGNOSIS — N2581 Secondary hyperparathyroidism of renal origin: Secondary | ICD-10-CM | POA: Diagnosis not present

## 2021-01-08 DIAGNOSIS — I151 Hypertension secondary to other renal disorders: Secondary | ICD-10-CM | POA: Diagnosis not present

## 2021-01-08 DIAGNOSIS — D509 Iron deficiency anemia, unspecified: Secondary | ICD-10-CM | POA: Diagnosis not present

## 2021-01-08 DIAGNOSIS — D631 Anemia in chronic kidney disease: Secondary | ICD-10-CM | POA: Diagnosis not present

## 2021-01-08 DIAGNOSIS — N186 End stage renal disease: Secondary | ICD-10-CM | POA: Diagnosis not present

## 2021-01-08 DIAGNOSIS — R519 Headache, unspecified: Secondary | ICD-10-CM | POA: Diagnosis not present

## 2021-01-08 DIAGNOSIS — Z992 Dependence on renal dialysis: Secondary | ICD-10-CM | POA: Diagnosis not present

## 2021-01-13 DIAGNOSIS — D631 Anemia in chronic kidney disease: Secondary | ICD-10-CM | POA: Diagnosis not present

## 2021-01-13 DIAGNOSIS — N2581 Secondary hyperparathyroidism of renal origin: Secondary | ICD-10-CM | POA: Diagnosis not present

## 2021-01-13 DIAGNOSIS — Z992 Dependence on renal dialysis: Secondary | ICD-10-CM | POA: Diagnosis not present

## 2021-01-13 DIAGNOSIS — I151 Hypertension secondary to other renal disorders: Secondary | ICD-10-CM | POA: Diagnosis not present

## 2021-01-13 DIAGNOSIS — N186 End stage renal disease: Secondary | ICD-10-CM | POA: Diagnosis not present

## 2021-01-13 DIAGNOSIS — R519 Headache, unspecified: Secondary | ICD-10-CM | POA: Diagnosis not present

## 2021-01-13 DIAGNOSIS — D509 Iron deficiency anemia, unspecified: Secondary | ICD-10-CM | POA: Diagnosis not present

## 2021-01-13 DIAGNOSIS — D689 Coagulation defect, unspecified: Secondary | ICD-10-CM | POA: Diagnosis not present

## 2021-01-15 ENCOUNTER — Encounter (HOSPITAL_COMMUNITY): Payer: Self-pay | Admitting: Emergency Medicine

## 2021-01-15 ENCOUNTER — Inpatient Hospital Stay (HOSPITAL_COMMUNITY)
Admission: EM | Admit: 2021-01-15 | Discharge: 2021-03-18 | DRG: 570 | Disposition: A | Discharge status: Home Health | Payer: Medicare HMO | Attending: Internal Medicine | Admitting: Internal Medicine

## 2021-01-15 ENCOUNTER — Emergency Department (HOSPITAL_COMMUNITY): Payer: Medicare HMO | Admitting: Anesthesiology

## 2021-01-15 ENCOUNTER — Encounter (HOSPITAL_COMMUNITY)
Admission: EM | Disposition: A | Discharge status: Home Health | Payer: Self-pay | Source: Home / Self Care | Attending: Internal Medicine

## 2021-01-15 DIAGNOSIS — T82598A Other mechanical complication of other cardiac and vascular devices and implants, initial encounter: Secondary | ICD-10-CM | POA: Diagnosis not present

## 2021-01-15 DIAGNOSIS — Z659 Problem related to unspecified psychosocial circumstances: Secondary | ICD-10-CM | POA: Diagnosis not present

## 2021-01-15 DIAGNOSIS — I70202 Unspecified atherosclerosis of native arteries of extremities, left leg: Secondary | ICD-10-CM | POA: Diagnosis present

## 2021-01-15 DIAGNOSIS — Y712 Prosthetic and other implants, materials and accessory cardiovascular devices associated with adverse incidents: Secondary | ICD-10-CM | POA: Diagnosis not present

## 2021-01-15 DIAGNOSIS — N250 Renal osteodystrophy: Secondary | ICD-10-CM | POA: Diagnosis not present

## 2021-01-15 DIAGNOSIS — I120 Hypertensive chronic kidney disease with stage 5 chronic kidney disease or end stage renal disease: Secondary | ICD-10-CM | POA: Diagnosis present

## 2021-01-15 DIAGNOSIS — R609 Edema, unspecified: Secondary | ICD-10-CM

## 2021-01-15 DIAGNOSIS — N2581 Secondary hyperparathyroidism of renal origin: Secondary | ICD-10-CM | POA: Diagnosis present

## 2021-01-15 DIAGNOSIS — T82510A Breakdown (mechanical) of surgically created arteriovenous fistula, initial encounter: Secondary | ICD-10-CM | POA: Diagnosis not present

## 2021-01-15 DIAGNOSIS — I82211 Chronic embolism and thrombosis of superior vena cava: Secondary | ICD-10-CM | POA: Diagnosis present

## 2021-01-15 DIAGNOSIS — N611 Abscess of the breast and nipple: Secondary | ICD-10-CM | POA: Diagnosis not present

## 2021-01-15 DIAGNOSIS — U071 COVID-19: Secondary | ICD-10-CM

## 2021-01-15 DIAGNOSIS — T829XXD Unspecified complication of cardiac and vascular prosthetic device, implant and graft, subsequent encounter: Secondary | ICD-10-CM | POA: Diagnosis not present

## 2021-01-15 DIAGNOSIS — L905 Scar conditions and fibrosis of skin: Secondary | ICD-10-CM | POA: Diagnosis not present

## 2021-01-15 DIAGNOSIS — H548 Legal blindness, as defined in USA: Secondary | ICD-10-CM | POA: Diagnosis present

## 2021-01-15 DIAGNOSIS — D509 Iron deficiency anemia, unspecified: Secondary | ICD-10-CM | POA: Diagnosis not present

## 2021-01-15 DIAGNOSIS — E669 Obesity, unspecified: Secondary | ICD-10-CM | POA: Diagnosis present

## 2021-01-15 DIAGNOSIS — T82898A Other specified complication of vascular prosthetic devices, implants and grafts, initial encounter: Secondary | ICD-10-CM | POA: Diagnosis not present

## 2021-01-15 DIAGNOSIS — N6489 Other specified disorders of breast: Secondary | ICD-10-CM | POA: Diagnosis not present

## 2021-01-15 DIAGNOSIS — Z87891 Personal history of nicotine dependence: Secondary | ICD-10-CM | POA: Diagnosis not present

## 2021-01-15 DIAGNOSIS — Z803 Family history of malignant neoplasm of breast: Secondary | ICD-10-CM

## 2021-01-15 DIAGNOSIS — I96 Gangrene, not elsewhere classified: Secondary | ICD-10-CM | POA: Diagnosis present

## 2021-01-15 DIAGNOSIS — T829XXA Unspecified complication of cardiac and vascular prosthetic device, implant and graft, initial encounter: Secondary | ICD-10-CM

## 2021-01-15 DIAGNOSIS — Z751 Person awaiting admission to adequate facility elsewhere: Secondary | ICD-10-CM | POA: Diagnosis not present

## 2021-01-15 DIAGNOSIS — R69 Illness, unspecified: Secondary | ICD-10-CM | POA: Diagnosis not present

## 2021-01-15 DIAGNOSIS — J9601 Acute respiratory failure with hypoxia: Secondary | ICD-10-CM | POA: Diagnosis present

## 2021-01-15 DIAGNOSIS — E8889 Other specified metabolic disorders: Secondary | ICD-10-CM | POA: Diagnosis present

## 2021-01-15 DIAGNOSIS — Z79899 Other long term (current) drug therapy: Secondary | ICD-10-CM

## 2021-01-15 DIAGNOSIS — Z992 Dependence on renal dialysis: Secondary | ICD-10-CM | POA: Diagnosis not present

## 2021-01-15 DIAGNOSIS — Z5900 Homelessness unspecified: Secondary | ICD-10-CM

## 2021-01-15 DIAGNOSIS — R52 Pain, unspecified: Secondary | ICD-10-CM | POA: Diagnosis not present

## 2021-01-15 DIAGNOSIS — Z6841 Body Mass Index (BMI) 40.0 and over, adult: Secondary | ICD-10-CM

## 2021-01-15 DIAGNOSIS — N61 Mastitis without abscess: Secondary | ICD-10-CM | POA: Diagnosis not present

## 2021-01-15 DIAGNOSIS — E871 Hypo-osmolality and hyponatremia: Secondary | ICD-10-CM | POA: Diagnosis not present

## 2021-01-15 DIAGNOSIS — D631 Anemia in chronic kidney disease: Secondary | ICD-10-CM | POA: Diagnosis present

## 2021-01-15 DIAGNOSIS — D62 Acute posthemorrhagic anemia: Secondary | ICD-10-CM | POA: Diagnosis not present

## 2021-01-15 DIAGNOSIS — N186 End stage renal disease: Secondary | ICD-10-CM

## 2021-01-15 DIAGNOSIS — Z90710 Acquired absence of both cervix and uterus: Secondary | ICD-10-CM

## 2021-01-15 DIAGNOSIS — D689 Coagulation defect, unspecified: Secondary | ICD-10-CM | POA: Diagnosis not present

## 2021-01-15 DIAGNOSIS — J9811 Atelectasis: Secondary | ICD-10-CM | POA: Diagnosis not present

## 2021-01-15 DIAGNOSIS — J1282 Pneumonia due to coronavirus disease 2019: Secondary | ICD-10-CM | POA: Diagnosis present

## 2021-01-15 DIAGNOSIS — L089 Local infection of the skin and subcutaneous tissue, unspecified: Secondary | ICD-10-CM | POA: Diagnosis not present

## 2021-01-15 DIAGNOSIS — I151 Hypertension secondary to other renal disorders: Secondary | ICD-10-CM | POA: Diagnosis not present

## 2021-01-15 DIAGNOSIS — R519 Headache, unspecified: Secondary | ICD-10-CM | POA: Diagnosis not present

## 2021-01-15 DIAGNOSIS — Z95820 Peripheral vascular angioplasty status with implants and grafts: Secondary | ICD-10-CM

## 2021-01-15 DIAGNOSIS — I729 Aneurysm of unspecified site: Secondary | ICD-10-CM | POA: Diagnosis not present

## 2021-01-15 HISTORY — PX: INCISION AND DRAINAGE ABSCESS: SHX5864

## 2021-01-15 LAB — CBC WITH DIFFERENTIAL/PLATELET
Abs Immature Granulocytes: 0.08 10*3/uL — ABNORMAL HIGH (ref 0.00–0.07)
Basophils Absolute: 0 10*3/uL (ref 0.0–0.1)
Basophils Relative: 0 %
Eosinophils Absolute: 0.1 10*3/uL (ref 0.0–0.5)
Eosinophils Relative: 1 %
HCT: 30 % — ABNORMAL LOW (ref 36.0–46.0)
Hemoglobin: 9.6 g/dL — ABNORMAL LOW (ref 12.0–15.0)
Immature Granulocytes: 1 %
Lymphocytes Relative: 7 %
Lymphs Abs: 0.8 10*3/uL (ref 0.7–4.0)
MCH: 31.2 pg (ref 26.0–34.0)
MCHC: 32 g/dL (ref 30.0–36.0)
MCV: 97.4 fL (ref 80.0–100.0)
Monocytes Absolute: 0.9 10*3/uL (ref 0.1–1.0)
Monocytes Relative: 8 %
Neutro Abs: 9.1 10*3/uL — ABNORMAL HIGH (ref 1.7–7.7)
Neutrophils Relative %: 83 %
Platelets: 347 10*3/uL (ref 150–400)
RBC: 3.08 MIL/uL — ABNORMAL LOW (ref 3.87–5.11)
RDW: 16.3 % — ABNORMAL HIGH (ref 11.5–15.5)
WBC: 10.9 10*3/uL — ABNORMAL HIGH (ref 4.0–10.5)
nRBC: 0 % (ref 0.0–0.2)

## 2021-01-15 LAB — BASIC METABOLIC PANEL
Anion gap: 13 (ref 5–15)
BUN: 15 mg/dL (ref 6–20)
CO2: 34 mmol/L — ABNORMAL HIGH (ref 22–32)
Calcium: 9.1 mg/dL (ref 8.9–10.3)
Chloride: 93 mmol/L — ABNORMAL LOW (ref 98–111)
Creatinine, Ser: 6.02 mg/dL — ABNORMAL HIGH (ref 0.44–1.00)
GFR, Estimated: 8 mL/min — ABNORMAL LOW (ref >60)
Glucose, Bld: 83 mg/dL (ref 70–99)
Potassium: 3.1 mmol/L — ABNORMAL LOW (ref 3.5–5.1)
Sodium: 140 mmol/L (ref 135–145)

## 2021-01-15 LAB — SARS CORONAVIRUS 2 (TAT 6-24 HRS): SARS Coronavirus 2: NEGATIVE

## 2021-01-15 LAB — SARS CORONAVIRUS 2 BY RT PCR (HOSPITAL ORDER, PERFORMED IN ~~LOC~~ HOSPITAL LAB): SARS Coronavirus 2: POSITIVE — AB

## 2021-01-15 LAB — I-STAT BETA HCG BLOOD, ED (MC, WL, AP ONLY): I-stat hCG, quantitative: 7.1 m[IU]/mL — ABNORMAL HIGH (ref <5)

## 2021-01-15 LAB — C-REACTIVE PROTEIN: CRP: 17.8 mg/dL — ABNORMAL HIGH (ref <1.0)

## 2021-01-15 SURGERY — INCISION AND DRAINAGE, ABSCESS
Anesthesia: General | Site: Breast | Laterality: Left

## 2021-01-15 MED ORDER — FENTANYL CITRATE (PF) 250 MCG/5ML IJ SOLN
INTRAMUSCULAR | Status: DC | PRN
  Administered 2021-01-15 – 2021-01-16 (×3): 50 ug via INTRAVENOUS

## 2021-01-15 MED ORDER — PROPOFOL 10 MG/ML IV BOLUS
INTRAVENOUS | Status: AC
  Filled 2021-01-15: qty 20

## 2021-01-15 MED ORDER — SODIUM CHLORIDE 0.9% FLUSH
3.0000 mL | Freq: Two times a day (BID) | INTRAVENOUS | Status: DC
  Administered 2021-01-16 – 2021-02-16 (×22): 3 mL via INTRAVENOUS

## 2021-01-15 MED ORDER — HYDROMORPHONE HCL 1 MG/ML IJ SOLN
0.5000 mg | Freq: Once | INTRAMUSCULAR | Status: AC
  Administered 2021-01-15: 0.5 mg via INTRAVENOUS
  Filled 2021-01-15: qty 1

## 2021-01-15 MED ORDER — LIDOCAINE-EPINEPHRINE-TETRACAINE (LET) TOPICAL GEL
3.0000 mL | Freq: Once | TOPICAL | Status: DC
  Filled 2021-01-15: qty 3

## 2021-01-15 MED ORDER — SODIUM CHLORIDE 0.9 % IV SOLN
100.0000 mg | Freq: Every day | INTRAVENOUS | Status: AC
  Administered 2021-01-17 – 2021-01-18 (×2): 100 mg via INTRAVENOUS
  Filled 2021-01-15 (×3): qty 20

## 2021-01-15 MED ORDER — HYDROCODONE-ACETAMINOPHEN 5-325 MG PO TABS
1.0000 | ORAL_TABLET | ORAL | Status: DC | PRN
  Administered 2021-01-15 – 2021-01-16 (×3): 1 via ORAL
  Filled 2021-01-15 (×3): qty 1

## 2021-01-15 MED ORDER — ONDANSETRON HCL 4 MG/2ML IJ SOLN
INTRAMUSCULAR | Status: DC | PRN
  Administered 2021-01-15: 4 mg via INTRAVENOUS

## 2021-01-15 MED ORDER — SODIUM CHLORIDE 0.9 % IV SOLN: INTRAVENOUS | Status: DC | PRN

## 2021-01-15 MED ORDER — FENTANYL CITRATE (PF) 250 MCG/5ML IJ SOLN
INTRAMUSCULAR | Status: AC
  Filled 2021-01-15: qty 5

## 2021-01-15 MED ORDER — SUCCINYLCHOLINE CHLORIDE 20 MG/ML IJ SOLN
INTRAMUSCULAR | Status: DC | PRN
  Administered 2021-01-15: 100 mg via INTRAVENOUS

## 2021-01-15 MED ORDER — AMOXICILLIN-POT CLAVULANATE 875-125 MG PO TABS: 1.0000 | ORAL_TABLET | Freq: Two times a day (BID) | ORAL | Status: DC

## 2021-01-15 MED ORDER — LIDOCAINE HCL (CARDIAC) PF 100 MG/5ML IV SOSY
PREFILLED_SYRINGE | INTRAVENOUS | Status: DC | PRN
  Administered 2021-01-15: 40 mg via INTRATRACHEAL

## 2021-01-15 MED ORDER — PROMETHAZINE HCL 25 MG/ML IJ SOLN: 6.2500 mg | INTRAMUSCULAR | Status: DC | PRN

## 2021-01-15 MED ORDER — FENTANYL CITRATE (PF) 100 MCG/2ML IJ SOLN
INTRAMUSCULAR | Status: AC
  Filled 2021-01-15: qty 2

## 2021-01-15 MED ORDER — SODIUM CHLORIDE 0.9% FLUSH: 3.0000 mL | INTRAVENOUS | Status: DC | PRN

## 2021-01-15 MED ORDER — MIDAZOLAM HCL 2 MG/2ML IJ SOLN
INTRAMUSCULAR | Status: AC
  Filled 2021-01-15: qty 2

## 2021-01-15 MED ORDER — ACETAMINOPHEN 650 MG RE SUPP: 650.0000 mg | Freq: Four times a day (QID) | RECTAL | Status: DC | PRN

## 2021-01-15 MED ORDER — RENA-VITE PO TABS
1.0000 | ORAL_TABLET | Freq: Every day | ORAL | Status: DC
  Administered 2021-01-16 – 2021-03-17 (×62): 1 via ORAL
  Filled 2021-01-15 (×62): qty 1

## 2021-01-15 MED ORDER — CHLORHEXIDINE GLUCONATE CLOTH 2 % EX PADS: 6.0000 | MEDICATED_PAD | Freq: Once | CUTANEOUS | Status: DC

## 2021-01-15 MED ORDER — TRAMADOL HCL 50 MG PO TABS: 50.0000 mg | ORAL_TABLET | Freq: Four times a day (QID) | ORAL | Status: DC | PRN

## 2021-01-15 MED ORDER — CARVEDILOL 25 MG PO TABS
25.0000 mg | ORAL_TABLET | Freq: Two times a day (BID) | ORAL | Status: DC
Start: 2021-01-16 — End: 2021-03-18
  Administered 2021-01-16 – 2021-03-18 (×104): 25 mg via ORAL
  Filled 2021-01-15 (×107): qty 1

## 2021-01-15 MED ORDER — HYDROCODONE-ACETAMINOPHEN 5-325 MG PO TABS
1.0000 | ORAL_TABLET | Freq: Once | ORAL | Status: AC
  Administered 2021-01-15: 1 via ORAL
  Filled 2021-01-15: qty 1

## 2021-01-15 MED ORDER — SENNA 8.6 MG PO TABS
1.0000 | ORAL_TABLET | Freq: Two times a day (BID) | ORAL | Status: DC
  Administered 2021-01-20 – 2021-03-17 (×86): 8.6 mg via ORAL
  Filled 2021-01-15 (×109): qty 1

## 2021-01-15 MED ORDER — SODIUM CHLORIDE 0.9 % IV SOLN
200.0000 mg | Freq: Once | INTRAVENOUS | Status: AC
  Administered 2021-01-16: 200 mg via INTRAVENOUS
  Filled 2021-01-15: qty 40

## 2021-01-15 MED ORDER — LIDOCAINE-PRILOCAINE 2.5-2.5 % EX CREA
1.0000 "application " | TOPICAL_CREAM | CUTANEOUS | Status: DC
  Filled 2021-01-15: qty 5

## 2021-01-15 MED ORDER — SEVELAMER CARBONATE 800 MG PO TABS
3200.0000 mg | ORAL_TABLET | Freq: Three times a day (TID) | ORAL | Status: DC
  Administered 2021-01-16 – 2021-03-17 (×126): 3200 mg via ORAL
  Filled 2021-01-15 (×141): qty 4

## 2021-01-15 MED ORDER — PROPOFOL 10 MG/ML IV BOLUS
INTRAVENOUS | Status: DC | PRN
  Administered 2021-01-15: 170 mg via INTRAVENOUS

## 2021-01-15 MED ORDER — ACETAMINOPHEN 325 MG PO TABS
650.0000 mg | ORAL_TABLET | Freq: Four times a day (QID) | ORAL | Status: DC | PRN
  Administered 2021-01-18 – 2021-02-12 (×11): 650 mg via ORAL
  Filled 2021-01-15 (×5): qty 2

## 2021-01-15 MED ORDER — PROPOFOL 10 MG/ML IV BOLUS
INTRAVENOUS | Status: AC
  Filled 2021-01-15: qty 40

## 2021-01-15 MED ORDER — CLONIDINE HCL 0.3 MG PO TABS
0.3000 mg | ORAL_TABLET | Freq: Three times a day (TID) | ORAL | Status: DC
  Administered 2021-01-16 – 2021-03-18 (×161): 0.3 mg via ORAL
  Filled 2021-01-15: qty 1
  Filled 2021-01-15: qty 3
  Filled 2021-01-15 (×14): qty 1
  Filled 2021-01-15: qty 3
  Filled 2021-01-15 (×48): qty 1
  Filled 2021-01-15: qty 3
  Filled 2021-01-15 (×41): qty 1
  Filled 2021-01-15: qty 3
  Filled 2021-01-15 (×2): qty 1
  Filled 2021-01-15: qty 3
  Filled 2021-01-15 (×41): qty 1
  Filled 2021-01-15: qty 3
  Filled 2021-01-15 (×13): qty 1
  Filled 2021-01-15: qty 3
  Filled 2021-01-15 (×9): qty 1

## 2021-01-15 MED ORDER — HEPARIN SODIUM (PORCINE) 5000 UNIT/ML IJ SOLN
5000.0000 [IU] | Freq: Three times a day (TID) | INTRAMUSCULAR | Status: DC
  Administered 2021-01-16 – 2021-01-25 (×13): 5000 [IU] via SUBCUTANEOUS
  Filled 2021-01-15 (×19): qty 1

## 2021-01-15 MED ORDER — SODIUM CHLORIDE 0.9 % IV SOLN: 250.0000 mL | INTRAVENOUS | Status: DC | PRN

## 2021-01-15 MED ORDER — FENTANYL CITRATE (PF) 100 MCG/2ML IJ SOLN
25.0000 ug | INTRAMUSCULAR | Status: DC | PRN
Start: 2021-01-15 — End: 2021-01-16
  Administered 2021-01-16 (×2): 50 ug via INTRAVENOUS

## 2021-01-15 MED ORDER — VANCOMYCIN HCL IN DEXTROSE 1-5 GM/200ML-% IV SOLN
1000.0000 mg | Freq: Once | INTRAVENOUS | Status: AC
  Administered 2021-01-15: 1000 mg via INTRAVENOUS
  Filled 2021-01-15: qty 200

## 2021-01-15 MED ORDER — PROMETHAZINE HCL 25 MG/ML IJ SOLN
INTRAMUSCULAR | Status: AC
  Filled 2021-01-15: qty 1

## 2021-01-15 SURGICAL SUPPLY — 34 items
BNDG GAUZE ELAST 4 BULKY (GAUZE/BANDAGES/DRESSINGS) ×4 IMPLANT
CNTNR URN SCR LID CUP LEK RST (MISCELLANEOUS) ×2 IMPLANT
CONT SPEC 4OZ STRL OR WHT (MISCELLANEOUS) ×2
COVER MAYO STAND STRL (DRAPES) IMPLANT
COVER SURGICAL LIGHT HANDLE (MISCELLANEOUS) ×2 IMPLANT
COVER WAND RF STERILE (DRAPES) ×2 IMPLANT
DRAPE LAPAROSCOPIC ABDOMINAL (DRAPES) ×2 IMPLANT
DRSG PAD ABDOMINAL 8X10 ST (GAUZE/BANDAGES/DRESSINGS) ×8 IMPLANT
ELECT CAUTERY BLADE 6.4 (BLADE) ×2 IMPLANT
ELECT REM PT RETURN 9FT ADLT (ELECTROSURGICAL) ×2
ELECTRODE REM PT RTRN 9FT ADLT (ELECTROSURGICAL) ×1 IMPLANT
GAUZE SPONGE 4X4 12PLY STRL (GAUZE/BANDAGES/DRESSINGS) IMPLANT
GLOVE BIOGEL PI IND STRL 6 (GLOVE) ×1 IMPLANT
GLOVE BIOGEL PI INDICATOR 6 (GLOVE) ×1
GLOVE BIOGEL PI MICRO 5.5 (GLOVE) ×1
GLOVE BIOGEL PI MICRO STRL 5.5 (GLOVE) ×1 IMPLANT
GOWN STRL REUS W/ TWL LRG LVL3 (GOWN DISPOSABLE) ×2 IMPLANT
GOWN STRL REUS W/TWL LRG LVL3 (GOWN DISPOSABLE) ×2
KIT BASIN OR (CUSTOM PROCEDURE TRAY) ×2 IMPLANT
KIT TURNOVER KIT B (KITS) ×2 IMPLANT
NS IRRIG 1000ML POUR BTL (IV SOLUTION) ×2 IMPLANT
PACK GENERAL/GYN (CUSTOM PROCEDURE TRAY) ×2 IMPLANT
PACK LITHOTOMY IV (CUSTOM PROCEDURE TRAY) IMPLANT
PAD ARMBOARD 7.5X6 YLW CONV (MISCELLANEOUS) ×2 IMPLANT
PENCIL SMOKE EVACUATOR (MISCELLANEOUS) IMPLANT
SPONGE LAP 18X18 RF (DISPOSABLE) IMPLANT
SURGILUBE 2OZ TUBE FLIPTOP (MISCELLANEOUS) IMPLANT
SWAB CULTURE ESWAB REG 1ML (MISCELLANEOUS) ×2 IMPLANT
SYR BULB EAR ULCER 3OZ GRN STR (SYRINGE) ×2 IMPLANT
TAPE CLOTH SURG 4X10 WHT LF (GAUZE/BANDAGES/DRESSINGS) ×2 IMPLANT
TOWEL GREEN STERILE (TOWEL DISPOSABLE) ×2 IMPLANT
TOWEL GREEN STERILE FF (TOWEL DISPOSABLE) ×2 IMPLANT
TUBE CONNECTING 12X1/4 (SUCTIONS) ×2 IMPLANT
YANKAUER SUCT BULB TIP NO VENT (SUCTIONS) IMPLANT

## 2021-01-15 NOTE — ED Notes (Signed)
I&D tray at bedside.

## 2021-01-15 NOTE — ED Triage Notes (Signed)
Patient BIB [facility] for burst abscess that opened under her left breast today during dialysis.

## 2021-01-15 NOTE — Anesthesia Procedure Notes (Signed)
Procedure Name: Intubation Date/Time: 01/15/2021 11:22 PM Performed by: Clovis Cao, CRNA Pre-anesthesia Checklist: Patient identified, Emergency Drugs available, Suction available, Patient being monitored and Timeout performed Patient Re-evaluated:Patient Re-evaluated prior to induction Oxygen Delivery Method: Circle system utilized Preoxygenation: Pre-oxygenation with 100% oxygen Induction Type: IV induction, Rapid sequence and Cricoid Pressure applied Laryngoscope Size: Glidescope and 4 Grade View: Grade I Tube type: Oral Tube size: 7.5 mm Number of attempts: 1 Airway Equipment and Method: Stylet and Video-laryngoscopy Placement Confirmation: ETT inserted through vocal cords under direct vision,  positive ETCO2 and breath sounds checked- equal and bilateral Secured at: 21 cm Tube secured with: Tape Dental Injury: Teeth and Oropharynx as per pre-operative assessment

## 2021-01-15 NOTE — ED Provider Notes (Signed)
I saw and evaluated the patient, reviewed the resident's note and I agree with the findings and plan.  EKG:    44 year old female presents with left breast abscess and cellulitis.  Has been seen for this before in the past.  On exam here she has areas of fluctuance along with surrounding erythema.  Will require admission for IV antibiotics   Lacretia Leigh, MD 01/15/21 1754

## 2021-01-15 NOTE — Anesthesia Preprocedure Evaluation (Signed)
Anesthesia Evaluation  Patient identified by MRN, date of birth, ID band Patient awake    Reviewed: Allergy & Precautions, NPO status , Patient's Chart, lab work & pertinent test results  Airway Mallampati: II  TM Distance: >3 FB Neck ROM: Full    Dental  (+) Dental Advisory Given   Pulmonary former smoker,    breath sounds clear to auscultation       Cardiovascular hypertension, Pt. on medications and Pt. on home beta blockers + Peripheral Vascular Disease   Rhythm:Regular Rate:Normal     Neuro/Psych negative neurological ROS     GI/Hepatic negative GI ROS, Neg liver ROS,   Endo/Other  negative endocrine ROS  Renal/GU Renal disease     Musculoskeletal   Abdominal   Peds  Hematology  (+) anemia ,   Anesthesia Other Findings   Reproductive/Obstetrics                             Anesthesia Physical Anesthesia Plan  ASA: III and emergent  Anesthesia Plan: General   Post-op Pain Management:    Induction: Intravenous  PONV Risk Score and Plan: 3 and Dexamethasone, Ondansetron and Treatment may vary due to age or medical condition  Airway Management Planned: Oral ETT  Additional Equipment:   Intra-op Plan:   Post-operative Plan: Extubation in OR  Informed Consent: I have reviewed the patients History and Physical, chart, labs and discussed the procedure including the risks, benefits and alternatives for the proposed anesthesia with the patient or authorized representative who has indicated his/her understanding and acceptance.     Dental advisory given  Plan Discussed with: CRNA  Anesthesia Plan Comments:         Anesthesia Quick Evaluation

## 2021-01-15 NOTE — Consult Note (Signed)
Rebekah Burton [birth year]  [id number].    Requesting MD:  Dr. Lacretia Leigh Chief Complaint/Reason for Consult: breast abscess  HPI:  Rebekah Burton is a 45 yo female with a history of ESRD who presents with pain and drainage from the left breast. About 3 weeks ago she developed a blister on her left breast, with surrounding pain. She applied warm compresses with no improvement. She was then seen in the ED on 1/11, and a chest CT and breast US both showed a subareolar abscess in the left breast, as well as some enlarged left axillary lymph nodes. She was started on oral Augmentin and referred for US-guided aspiration at the breast center, however she never had that procedure done. She has had significantly worsening pain for the last 24 hours, and this morning during dialysis she felt the blistered area open up. She reports there was a large amount of drainage, and it has been draining since then. She denies fevers or chills. Her last PO intake was last night. In the ED she is afebrile and hemodynamically stable. WBC is 10.  Patient is not lactating. She reports she had a mammogram a few months ago and it was normal (we do not have records of this). Her aunt had breast cancer.  ROS: Review of Systems  Constitutional: Negative for chills and fever.  HENT: Negative for hearing loss.   Eyes: Negative for redness.  Respiratory: Negative for wheezing.   Cardiovascular: Negative for chest pain.  Gastrointestinal: Negative for nausea and vomiting.  Musculoskeletal: Negative for back pain.  Skin: Negative for itching.  Neurological: Negative for weakness.    Family History  Problem Relation Age of Onset  . Peripheral Artery Disease Neg Hx   . Kidney failure Neg Hx     Past Medical History:  Diagnosis Date  . Blind   . ESRD (end stage renal disease) ([place])   . Foot ulceration ([place]) 12/2018  . Hypertension   . PAD (peripheral artery disease) ([place])     Past Surgical History:  Procedure  Laterality Date  . ABDOMINAL AORTOGRAM W/LOWER EXTREMITY Bilateral 01/17/2019   Procedure: ABDOMINAL AORTOGRAM W/LOWER EXTREMITY;  Surgeon: Waynetta Sandy, MD;  Location: [facility];  Service: Cardiovascular;  Laterality: Bilateral;  . ABDOMINAL HYSTERECTOMY    . ENDARTERECTOMY FEMORAL Left 01/18/2019   Procedure: ENDARTERECTOMY FEMORAL with Perfundaplasty;  Surgeon: Rosetta Posner, MD;  Location: [facility];  Service: Vascular;  Laterality: Left;  . FEMORAL-POPLITEAL BYPASS GRAFT Left 01/18/2019   Procedure: Left  FEMORAL- to  Below the Knee POPLITEAL ARTERY bypass using reversed safenous vein.;  Surgeon: Rosetta Posner, MD;  [facility];  Service: Vascular;  Laterality: Left;  . IR THROMBECTOMY AV FISTULA W/THROMBOLYSIS/PTA/STENT INC/SHUNT/IMG LT Left 10/23/2020  . toe removal Right    All toes on right foot have been removed.   . 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 Her smoking use included cigarettes. She has a 10.00 pack-year smoking history. She has never used smokeless tobacco. She reports that she does not drink alcohol and does not use drugs.  Allergies:  Allergies  Allergen Reactions  . Percocet [Oxycodone-Acetaminophen] Hives    (Not in a hospital admission)    Physical Exam: Blood pressure 136/66, pulse 97, temperature 98.4 F (36.9  C), temperature source Oral, resp. rate 16, height '5\' 4"'$  (1.626 m), weight 100.2 kg, SpO2 97 %. General: resting comfortably, appears stated age, no apparent distress Neurological: alert and oriented, no focal deficits, cranial nerves grossly in tact HEENT: normocephalic, atraumatic, oropharynx clear, no scleral icterus CV: regular rate and rhythm, extremities warm and well-perfused Respiratory: normal work of breathing, symmetric  chest wall expansion Breast: left breast is diffusely erythematous and indurated with peau d'orange appearance. Inferior to the left nipple there is an area of necrotic skin with an open wound, and an adjacent area of fluctuant, erythematous skin. Minimal drainage expressed. No palpable left axillary adenopathy. No palpable left breast masses. Right breast is normal in appearance. Extremities: warm and well-perfused, no deformities, moving all extremities spontaneously Psychiatric: normal mood and affect Skin: warm and dry, no jaundice   Results for orders placed or performed during the hospital encounter of 01/15/21 (from the past 48 hour(s))  I-Stat beta hCG blood, ED     Status: Abnormal   Collection Time: 01/15/21  6:35 PM  Result Value Ref Range   I-stat hCG, quantitative 7.1 (H) <5 mIU/mL   Comment 3            Comment:   GEST. AGE      CONC.  (mIU/mL)   <=1 WEEK        5 - 50     2 WEEKS       50 - 500     3 WEEKS       100 - 10,000     4 WEEKS     1,000 - 30,000        FEMALE AND NON-PREGNANT FEMALE:     LESS THAN 5 mIU/mL   CBC with Differential     Status: Abnormal   Collection Time: 01/15/21  6:55 PM  Result Value Ref Range   WBC 10.9 (H) 4.0 - 10.5 K/uL   RBC 3.08 (L) 3.87 - 5.11 MIL/uL   Hemoglobin 9.6 (L) 12.0 - 15.0 g/dL   HCT 30.0 (L) 36.0 - 46.0 %   MCV 97.4 80.0 - 100.0 fL   MCH 31.2 26.0 - 34.0 pg   MCHC 32.0 30.0 - 36.0 g/dL   RDW 16.3 (H) 11.5 - 15.5 %   Platelets 347 150 - 400 K/uL   nRBC 0.0 0.0 - 0.2 %   Neutrophils Relative % 83 %   Neutro Abs 9.1 (H) 1.7 - 7.7 K/uL   Lymphocytes Relative 7 %   Lymphs Abs 0.8 0.7 - 4.0 K/uL   Monocytes Relative 8 %   Monocytes Absolute 0.9 0.1 - 1.0 K/uL   Eosinophils Relative 1 %   Eosinophils Absolute 0.1 0.0 - 0.5 K/uL   Basophils Relative 0 %   Basophils Absolute 0.0 0.0 - 0.1 K/uL   Immature Granulocytes 1 %   Abs Immature Granulocytes 0.08 (H) 0.00 - 0.07 K/uL    Comment: Performed at [facility], [address] [place], [place] [id number]  Basic metabolic panel     Status: Abnormal   Collection Time: 01/15/21  6:55 PM  Result Value Ref Range   Sodium 140 135 - 145 mmol/L   Potassium 3.1 (L) 3.5 - 5.1 mmol/L   Chloride 93 (L) 98 - 111 mmol/L   CO2 34 (H) 22 - 32 mmol/L   Glucose, Bld 83 70 - 99 mg/dL    Comment: Glucose reference range applies only to samples taken after  fasting for at least 8 hours.   BUN 15 6 - 20 mg/dL   Creatinine, Ser 6.02 (H) 0.44 - 1.00 mg/dL   Calcium 9.1 8.9 - 10.3 mg/dL   GFR, Estimated 8 (L) >60 mL/min    Comment: (NOTE) Calculated using the CKD-EPI Creatinine Equation (2021)    Anion gap 13 5 - 15    Comment: Performed at [facility] [address] [place], [place] [address]  C-reactive protein     Status: Abnormal   Collection Time: 01/15/21  6:55 PM  Result Value Ref Range   CRP 17.8 (H) <1.0 mg/dL    Comment: Performed at [facility] [address] [place], [place] [address]   No results found.    Assessment/Plan 45 yo female with a history of PAD and ESRD, presenting with a left breast abscess and associated cellulitis. She has had some spontaneous drainage today but there is skin necrosis with an area of fluctuance, and I do not think this abscess is adequately drained. There is also concern for an underlying malignancy. Will take her to the OR tonight for incision and drainage and likely biopsy. - NPO for procedure, ok for diet after OR - Vancomycin started in ED, will send wound cultures from OR - Pain control - Patient is being admitted to hospitalist service, general surgery will follow closely   Michaelle Birks, [facility], Hepatobiliary and Pancreatic Surgery 01/15/21 7:59 PM

## 2021-01-15 NOTE — H&P (Signed)
History and Physical    Rebekah Burton [id number] DOB: [birth year] DOA: 01/15/2021  PCP: Trey Sailors, PA (Confirm with patient/family/NH records and if not entered, this has to be entered at [facility] Main point of entry) Patient coming from: home  I have personally briefly reviewed patient's old medical records in [place]  Chief Complaint: spontaneous drainage from breast abscess  HPI: Rebekah Burton is a 45 y.o. female with medical history significant of ESRD-HD M-W-F, blindness, HTN, PAD had developed a breast abscess and was seen in ED 01/04/21 where U/S confirmed abscess. She was to have been seen as outpatient for aspiration/drainage but did not get to make that appt. Today in HD the breast abscess opened and drained spontaneously. Because of continued drainage she presents to [facility] for evaluation.   ED Course: T 98.4  127/89  HR 82  RR 16. Lab revealed Cr 6.2, nl potassium CBC nl. She tested positive for Covid 19. CRP 17.8. She was seen in consultation by Dr. Zenia Resides for [place] - who has taken her to the OR for further exlporation, excision of necrotic tissue and biopsy of any potential mass. She was started on Vanocmycin in the ED.   Review of Systems: As per HPI otherwise 10 point review of systems negative.    Past Medical History:  Diagnosis Date  . Blind   . ESRD (end stage renal disease) ([place])   . Foot ulceration ([place]) 12/2018  . Hypertension   . PAD (peripheral artery disease) ([place])     Past Surgical History:  Procedure Laterality Date  . ABDOMINAL AORTOGRAM W/LOWER EXTREMITY Bilateral 01/17/2019   Procedure: ABDOMINAL AORTOGRAM W/LOWER EXTREMITY;  Surgeon: Waynetta Sandy, MD;  Location: [place] [facility];  Service: Cardiovascular;  Laterality: Bilateral;  . ABDOMINAL HYSTERECTOMY    . ENDARTERECTOMY FEMORAL Left 01/18/2019   Procedure: ENDARTERECTOMY FEMORAL with Perfundaplasty;  Surgeon: Rosetta Posner, MD;  Location: [facility];  Service: Vascular;  Laterality:  Left;  . FEMORAL-POPLITEAL BYPASS GRAFT Left 01/18/2019   Procedure: Left  FEMORAL- to  Below the Knee POPLITEAL ARTERY bypass using reversed safenous vein.;  Surgeon: Rosetta Posner, MD;  [facility];  Service: Vascular;  Laterality: Left;  . IR THROMBECTOMY AV FISTULA W/THROMBOLYSIS/PTA/STENT INC/SHUNT/IMG LT Left 10/23/2020  . toe removal Right    All toes on right foot have been removed.   . TRANSMETATARSAL AMPUTATION Left 01/18/2019   Procedure: TRANSMETATARSAL AMPUTATION;  Surgeon: Rosetta Posner, MD;  [facility];  Service: Vascular;  Laterality: Left;     reports that she quit smoking about 2 years ago. Her smoking use included cigarettes. She has a 10.00 pack-year smoking history. She has never used smokeless tobacco. She reports that she does not drink alcohol and does not use drugs.  Allergies  Allergen Reactions  . Percocet [Oxycodone-Acetaminophen] Hives    Family History  Problem Relation Age of Onset  . Peripheral Artery Disease Neg Hx   . Kidney failure Neg Hx      Prior to Admission medications   Medication Sig Start Date End Date Taking? Authorizing Provider  amoxicillin-clavulanate (AUGMENTIN) 875-125 MG tablet Take 1 tablet by mouth every 12 (twelve) hours. 01/05/21   Tacy Learn, PA-C  b complex-vitamin c-folic acid (NEPHRO-VITE) 0.8 MG TABS tablet Take 1 tablet by mouth every Monday, Wednesday, and Friday with hemodialysis. 11/20/18   [provider]  carvedilol (COREG) 25 MG tablet Take 25 mg by mouth 2 (two) times daily. 01/04/19  [provider]  cloNIDine (CATAPRES) 0.3 MG tablet Take 0.3 mg by mouth 3 (three) times daily. 01/01/21   [provider]  lidocaine-prilocaine (EMLA) cream Apply 1 application topically every Monday, Wednesday, and Friday with hemodialysis.  11/20/18   [provider]  oxyCODONE (OXY IR/ROXICODONE) 5 MG immediate release tablet Take 1 tablet (5 mg total) by mouth every 4 (four) hours as needed  for moderate pain. Patient not taking: No sig reported 02/09/19   Elgergawy, Silver Huguenin, MD  RENVELA 800 MG tablet Take 800 mg by mouth See admin instructions. 4 tabs tid w/meals 11/20/18   [provider]  traMADol (ULTRAM) 50 MG tablet Take 1 tablet (50 mg total) by mouth every 6 (six) hours as needed for moderate pain. Patient not taking: No sig reported 02/09/19   Elgergawy, Silver Huguenin, MD    Physical Exam: Vitals:   01/15/21 1435 01/15/21 1952 01/15/21 2125  BP: (!) 152/74 136/66 127/89  Pulse: 87 97 82  Resp: '20 16 16  '$ Temp: 98.4 F (36.9 C) 98.4 F (36.9 C)   TempSrc: Oral Oral   SpO2: 100% 97% 97%  Weight: 100.2 kg    Height: '5\' 4"'$  (1.626 m)       Vitals:   01/15/21 1435 01/15/21 1952 01/15/21 2125  BP: (!) 152/74 136/66 127/89  Pulse: 87 97 82  Resp: '20 16 16  '$ Temp: 98.4 F (36.9 C) 98.4 F (36.9 C)   TempSrc: Oral Oral   SpO2: 100% 97% 97%  Weight: 100.2 kg    Height: '5\' 4"'$  (1.626 m)     General: obese woman in bed in no distress. Eyes: lids and conjunctivae normal ENMT: Mucous membranes are moist. Posterior pharynx clear of any exudate or lesions.Normal dentition.  Neck: normal, supple, no masses, no thyromegaly Breast - pendulous breasts. Left breast with surgical dressing in place after I&D. Whole area around the dressing very tender. Respiratory: clear to auscultation bilaterally, no wheezing, no crackles. Normal respiratory effort. No accessory muscle use.  Cardiovascular: Regular rate and rhythm, no murmurs / rubs / gallops. No extremity edema. 2+ pedal pulses. No carotid bruits.  Abdomen: obese, no tenderness, no masses palpated.  Bowel sounds positive.  Musculoskeletal: no clubbing / cyanosis. No joint deformity upper and lower extremities. Good ROM, no contractures. Normal muscle tone.  Skin: no rashes, lesions, ulcers. No induration. Vascular access upper left arm.  Neurologic: CN 2-12 grossly intact x/ blindess. Sensation intact. Strength 5/5 in  all 4.  Psychiatric: Normal judgment and insight. Alert and oriented x 3. Normal mood.     Labs on Admission: I have personally reviewed following labs and imaging studies  CBC: Recent Labs  Lab 01/15/21 1855  WBC 10.9*  NEUTROABS 9.1*  HGB 9.6*  HCT 30.0*  MCV 97.4  PLT [id number]   Basic Metabolic Panel: Recent Labs  Lab 01/15/21 1855  NA 140  K 3.1*  CL 93*  CO2 34*  GLUCOSE 83  BUN 15  CREATININE 6.02*  CALCIUM 9.1   GFR: Estimated Creatinine Clearance: 14 mL/min (A) (by C-G formula based on SCr of 6.02 mg/dL (H)). Liver Function Tests: No results for input(s): AST, ALT, ALKPHOS, BILITOT, PROT, ALBUMIN in the last 168 hours. No results for input(s): LIPASE, AMYLASE in the last 168 hours. No results for input(s): AMMONIA in the last 168 hours. Coagulation Profile: No results for input(s): INR, PROTIME in the last 168 hours. Cardiac Enzymes: No results for input(s): CKTOTAL, CKMB, CKMBINDEX, TROPONINI  in the last 168 hours. BNP (last 3 results) No results for input(s): PROBNP in the last 8760 hours. HbA1C: No results for input(s): HGBA1C in the last 72 hours. CBG: No results for input(s): GLUCAP in the last 168 hours. Lipid Profile: No results for input(s): CHOL, HDL, LDLCALC, TRIG, CHOLHDL, LDLDIRECT in the last 72 hours. Thyroid Function Tests: No results for input(s): TSH, T4TOTAL, FREET4, T3FREE, THYROIDAB in the last 72 hours. Anemia Panel: No results for input(s): VITAMINB12, FOLATE, FERRITIN, TIBC, IRON, RETICCTPCT in the last 72 hours. Urine analysis: No results found for: COLORURINE, APPEARANCEUR, LABSPEC, PHURINE, GLUCOSEU, HGBUR, BILIRUBINUR, KETONESUR, PROTEINUR, UROBILINOGEN, NITRITE, LEUKOCYTESUR  Radiological Exams on Admission: No results found.  EKG: Independently reviewed. No EKG done in ED  Assessment/Plan Active Problems:   COVID-19 virus infection   ESRD (end stage renal disease) (HCC)   Breast abscess    1. Breast Abscess - as  above. Patient taken to OR Plan Wound care per GS recommendations  Vancomycin -  Will adjust per culture results  Pain mgt - tramadol  2. ESRD - will notify Nephrology for HD Monday  3. Covid Positive - patient asymptomatic re: covid Plan CXR  Full inflammatory marker assessment  Routine covid protocols/precautions  remdesiver per pharmacy - 3 day course.   DVT prophylaxis: heparin Code Status: full code  Family Communication: call to [facility]-listed as emergency contact - no answer   Disposition Plan: Home when stable  Consults called: Dr. Lilian Coma; nephrology (with names) Admission status: inpatient    Adella Hare MD [facility] Pager [phone]  If 7PM-7AM, please contact night-coverage [email or web address] Password [facility]  01/15/2021, 11:44 PM

## 2021-01-15 NOTE — Op Note (Signed)
Date: 01/15/21  Patient: Rebekah Burton MRN: [id number]  Preoperative Diagnosis: Left breast abscess Postoperative Diagnosis: Same  Procedure: Incision and drainage of left breast abscess with excisional debridement  Surgeon: Michaelle Birks, MD  EBL: Minimal  Anesthesia: General  Specimens: 1. Culture of left breast abscess 2. Left breast skin 3. Left breast abscess cavity  Indications: Rebekah Burton is a 45 yo female with a history of ESRD who presented with left breast pain for the last few weeks. She was seen on 1/11 with an abscess noted on imaging and was started on antibiotics, however never received follow up for aspiration and drainage. This morning she developed spontaneous drainage from the inferior left breast and presented to the ED. She was found to have extensive cellulitis as well as an area of skin necrosis on the inferior breast with drainage. She agreed to proceed with operative debridement.  Findings: Large subareolar abscess cavity draining via the inferior left breast about 3cm from the nipple, with associated skin necrosis. Excisional debridement of a 4x6cm area of skin necrosis was performed, with washout and biopsy of the skin and abscess cavity. Wound cultures sent.  Procedure details: Informed consent was obtained in the preoperative area prior to the procedure. The patient was brought to the operating room and placed on the table in the supine position. General anesthesia was induced and appropriate lines and drains were placed for intraoperative monitoring. Perioperative antibiotics were administered per SCIP guidelines. The left breast was prepped and draped in the usual sterile fashion. A pre-procedure timeout was taken verifying patient identity, surgical site and procedure to be performed.  There was an area of spontaneous drainage from a necrotic patch of skin on the inferior left breast. This necrotic skin was excised using cautery, with the total excised area  measuring about 4x6cm. The skin was sent for pathology. There was purulent drainage within the underlying wound, and this was sent for culture. The wound was probed and tracked deeper to a large subareolar cavity. There was some necrotic tissue that was debrided using cautery. A sample of the wall of the abscess cavity was sent for pathology. The wound was thoroughly irrigated with saline and packed with saline-moistened kerlex. A clean dressing was placed.  The patient tolerated the procedure with no apparent complications. All counts were correct x2 at the end of the procedure.  Michaelle Birks, MD 01/15/21 11:53 PM

## 2021-01-15 NOTE — ED Provider Notes (Signed)
[place] EMERGENCY DEPARTMENT Provider Note   CSN: [id number] Arrival date & time: 01/15/21  1432     History Chief Complaint  Patient presents with  . Abscess    Rebekah Burton is a 45 y.o. female.  The history is provided by the patient.  Abscess Location:  Torso Torso abscess location:  L breast Size:  8x6 Abscess quality: draining, fluctuance, painful, redness and warmth   Red streaking: no   Duration:  2 months Progression:  Worsening Pain details:    Quality:  Unable to specify   Severity:  Moderate Chronicity:  Chronic Associated symptoms: no fever and no vomiting        Past Medical History:  Diagnosis Date  . Blind   . ESRD (end stage renal disease) ([place])   . Foot ulceration ([place]) 12/2018  . Hypertension   . PAD (peripheral artery disease) [facility])     Patient Active Problem List   Diagnosis Date Noted  . Breast abscess 01/15/2021  . COVID-19 virus infection 01/15/2021  . Breast abscess of female 01/15/2021  . Cellulitis of left lower extremity 02/05/2019  . Post-operative pain   . Acute blood loss anemia   . S/P transmetatarsal amputation of foot, left ([place])   . Ischemic ulcer of left foot ([place]) 01/16/2019  . ESRD (end stage renal disease) ([place]) 01/16/2019  . HTN (hypertension) 01/16/2019  . 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  . PAD (peripheral artery disease) ([place]) 01/16/2019    Past Surgical History:  Procedure Laterality Date  . ABDOMINAL AORTOGRAM W/LOWER EXTREMITY Bilateral 01/17/2019   Procedure: ABDOMINAL AORTOGRAM W/LOWER EXTREMITY;  Surgeon: Waynetta Sandy, MD;  Location: [place] [facility];  Service: Cardiovascular;  Laterality: Bilateral;  . ABDOMINAL HYSTERECTOMY    . ENDARTERECTOMY FEMORAL Left 01/18/2019   Procedure: ENDARTERECTOMY FEMORAL with Perfundaplasty;  Surgeon: Rosetta Posner, MD;  Location: [facility];  Service: Vascular;  Laterality: Left;  . FEMORAL-POPLITEAL BYPASS GRAFT Left 01/18/2019   Procedure:  Left  FEMORAL- to  Below the Knee POPLITEAL ARTERY bypass using reversed safenous vein.;  Surgeon: Rosetta Posner, MD;  Location: [facility];  Service: Vascular;  Laterality: Left;  . IR THROMBECTOMY AV FISTULA W/THROMBOLYSIS/PTA/STENT INC/SHUNT/IMG LT Left 10/23/2020  . toe removal Right    All toes on right foot have been removed.   . TRANSMETATARSAL AMPUTATION Left 01/18/2019   Procedure: TRANSMETATARSAL AMPUTATION;  Surgeon: Rosetta Posner, MD;  Location: [facility] OR;  Service: Vascular;  Laterality: Left;     OB History   No obstetric history on file.     Family History  Problem Relation Age of Onset  . Peripheral Artery Disease Neg Hx   . Kidney failure Neg Hx     Social History   Tobacco Use  . Smoking status: Former Smoker    Packs/day: 0.50    Years: 20.00    Pack years: 10.00    Types: Cigarettes    Quit date: 01/14/2019    Years since quitting: 2.0  . Smokeless tobacco: Never Used  . Tobacco comment: <1 PPD  Vaping Use  . Vaping Use: Never used  Substance Use Topics  . Alcohol use: Never  . Drug use: Never    Home Medications Prior to Admission medications   Medication Sig Start Date End Date Taking? Authorizing Provider  amoxicillin-clavulanate (AUGMENTIN) 875-125 MG tablet Take 1 tablet by mouth every 12 (twelve) hours. 01/05/21   Tacy Learn, PA-C  b complex-vitamin c-folic acid (  NEPHRO-VITE) 0.8 MG TABS tablet Take 1 tablet by mouth every Monday, Wednesday, and Friday with hemodialysis. 11/20/18   [provider]  carvedilol (COREG) 25 MG tablet Take 25 mg by mouth 2 (two) times daily. 01/04/19   [provider]  cloNIDine (CATAPRES) 0.3 MG tablet Take 0.3 mg by mouth 3 (three) times daily. 01/01/21   [provider]  lidocaine-prilocaine (EMLA) cream Apply 1 application topically every Monday, Wednesday, and Friday with hemodialysis.  11/20/18   [provider]  oxyCODONE (OXY IR/ROXICODONE) 5 MG immediate release tablet Take 1 tablet  (5 mg total) by mouth every 4 (four) hours as needed for moderate pain. Patient not taking: No sig reported 02/09/19   Elgergawy, Silver Huguenin, MD  RENVELA 800 MG tablet Take 800 mg by mouth See admin instructions. 4 tabs tid w/meals 11/20/18   [provider]  traMADol (ULTRAM) 50 MG tablet Take 1 tablet (50 mg total) by mouth every 6 (six) hours as needed for moderate pain. Patient not taking: No sig reported 02/09/19   Elgergawy, Silver Huguenin, MD    Allergies    Percocet [oxycodone-acetaminophen]  Review of Systems   Review of Systems  Constitutional: Negative for chills and fever.  HENT: Negative for ear pain and sore throat.   Eyes: Negative for pain and visual disturbance.  Respiratory: Negative for cough and shortness of breath.   Cardiovascular: Negative for chest pain and palpitations.  Gastrointestinal: Negative for abdominal pain and vomiting.  Genitourinary: Negative for dysuria and hematuria.  Musculoskeletal: Negative for arthralgias and back pain.  Skin: Positive for wound. Negative for color change and rash.  Neurological: Negative for seizures and syncope.  All other systems reviewed and are negative.   Physical Exam Updated Vital Signs BP (!) 144/82   Pulse 67   Temp (!) 97.2 F (36.2 C)   Resp 15   Ht '5\' 4"'$  (1.626 m)   Wt 100.2 kg   SpO2 100%   BMI 37.93 kg/m   Physical Exam Vitals and nursing note reviewed.  Constitutional:      General: She is not in acute distress.    Appearance: She is well-developed and well-nourished. She is obese.  HENT:     Head: Normocephalic and atraumatic.  Eyes:     Conjunctiva/sclera: Conjunctivae normal.  Cardiovascular:     Rate and Rhythm: Normal rate and regular rhythm.     Heart sounds: No murmur heard.   Pulmonary:     Effort: Pulmonary effort is normal. No respiratory distress.     Breath sounds: Normal breath sounds.  Chest:  Breasts:     Left: Skin change and tenderness present.      Comments: Open  area draining purulent and serosanguineous fluid at the 5 o 'clock position of the breast in reference to nipple; no significant axillary lymphadenopathy Abdominal:     Palpations: Abdomen is soft.     Tenderness: There is no abdominal tenderness.  Musculoskeletal:        General: No edema.     Cervical back: Neck supple.  Skin:    General: Skin is warm and dry.  Neurological:     Mental Status: She is alert.  Psychiatric:        Mood and Affect: Mood and affect normal.     ED Results / Procedures / Treatments   Labs (all labs ordered are listed, but only abnormal results are displayed) Labs Reviewed  SARS CORONAVIRUS 2 BY RT PCR [facility]  ORDER, PERFORMED IN [place] [facility]) - Abnormal; Notable for the following components:      Result Value   SARS Coronavirus 2 POSITIVE (*)    All other components within normal limits  CBC WITH DIFFERENTIAL/PLATELET - Abnormal; Notable for the following components:   WBC 10.9 (*)    RBC 3.08 (*)    Hemoglobin 9.6 (*)    HCT 30.0 (*)    RDW 16.3 (*)    Neutro Abs 9.1 (*)    Abs Immature Granulocytes 0.08 (*)    All other components within normal limits  BASIC METABOLIC PANEL - Abnormal; Notable for the following components:   Potassium 3.1 (*)    Chloride 93 (*)    CO2 34 (*)    Creatinine, Ser 6.02 (*)    GFR, Estimated 8 (*)    All other components within normal limits  C-REACTIVE PROTEIN - Abnormal; Notable for the following components:   CRP 17.8 (*)    All other components within normal limits  I-STAT BETA HCG BLOOD, ED ([facility], [facility], [facility] ONLY) - Abnormal; Notable for the following components:   I-stat hCG, quantitative 7.1 (*)    All other components within normal limits  SARS CORONAVIRUS 2 (TAT 6-24 HRS)  AEROBIC/ANAEROBIC CULTURE (SURGICAL/DEEP WOUND)  GRAM STAIN  AEROBIC/ANAEROBIC CULTURE (SURGICAL/DEEP WOUND)  HIV ANTIBODY (ROUTINE TESTING W REFLEX)  BRAIN NATRIURETIC PEPTIDE  D-DIMER, QUANTITATIVE (NOT AT [facility])   FERRITIN  LACTATE DEHYDROGENASE  PROCALCITONIN  C-REACTIVE PROTEIN  COMPREHENSIVE METABOLIC PANEL  SURGICAL PATHOLOGY    EKG None  Radiology No results found.  Procedures Procedures (including critical care time)  Medications Ordered in ED Medications  lidocaine-EPINEPHrine-tetracaine (LET) topical gel ( Topical MAR [place] 01/16/21 0108)  HYDROcodone-acetaminophen (NORCO/VICODIN) 5-325 MG per tablet 1 tablet ( Oral MAR [place] 01/16/21 0108)  traMADol (ULTRAM) tablet 50 mg (has no administration in time range)  carvedilol (COREG) tablet 25 mg (has no administration in time range)  cloNIDine (CATAPRES) tablet 0.3 mg (has no administration in time range)  sevelamer carbonate (RENVELA) tablet 3,200 mg (has no administration in time range)  multivitamin (RENA-VIT) tablet 1 tablet (has no administration in time range)  lidocaine-prilocaine (EMLA) cream 1 application (has no administration in time range)  fentaNYL (SUBLIMAZE) 100 MCG/2ML injection (has no administration in time range)  promethazine (PHENERGAN) 25 MG/ML injection (has no administration in time range)  heparin injection 5,000 Units (has no administration in time range)  sodium chloride flush (NS) 0.9 % injection 3 mL (has no administration in time range)  sodium chloride flush (NS) 0.9 % injection 3 mL (has no administration in time range)  0.9 %  sodium chloride infusion (has no administration in time range)  acetaminophen (TYLENOL) tablet 650 mg (has no administration in time range)    Or  acetaminophen (TYLENOL) suppository 650 mg (has no administration in time range)  senna (SENOKOT) tablet 8.6 mg (has no administration in time range)  remdesivir 200 mg in sodium chloride 0.9% 250 mL IVPB (has no administration in time range)    Followed by  remdesivir 100 mg in sodium chloride 0.9 % 100 mL IVPB (has no administration in time range)  HYDROcodone-acetaminophen (NORCO/VICODIN) 5-325 MG per tablet 1 tablet (1 tablet  Oral Given 01/15/21 1720)  vancomycin (VANCOCIN) IVPB 1000 mg/200 mL premix (0 mg Intravenous Stopped 01/15/21 2120)  HYDROmorphone (DILAUDID) injection 0.5 mg (0.5 mg Intravenous Given 01/15/21 2115)    ED Course  I have reviewed the triage vital  signs and the nursing notes.  Pertinent labs & imaging results that were available during my care of the patient were reviewed by me and considered in my medical decision making (see chart for details).    MDM Rules/Calculators/A&P                          This is a 45 year old female with a past medical history of end-stage renal disease on dialysis, blindness, hypertension, peripheral artery disease, who presents emergency department for evaluation of open and draining left breast abscess.  She reports that this started as a knot approximately 3 to 4 months ago in which she underwent a mammogram at that time that did not show any evidence of underlying malignancy, the area continued to swell and a few weeks ago became more painful, she reports it was never draining but was seen in the emergency department 10 days ago  Patient was seen here approximately 10 days ago and had a CT scan done that showed extensive subcutaneous fat stranding and skin thickening in the left breast. There is a internally hypoattenuating mass or fluid collection in the subareolar left breast measuring 7.3 x 7.0 x 4.8 cm. Enlarged left axillary and subpectoral lymph nodes. This may reflect a benign breast abscess depending upon clinical presentation, but locally advanced malignancy with involvement of the skin and lymph nodes would be difficult to confidently distinguish based on CT. Recommend initial targeted ultrasound to assess for drainable fluid collection.  Also had an ultrasound done that showed Aspiration of the left breast cystic mass should be considered. Close follow-up exams suggested to demonstrate complete resolution in order to exclude a tumor.  General  surgery was consulted at that time that arranged for her to be seen in breast clinic for mammogram and aspiration/culture, unfortunately, the patient was unable to make this appointment.  Patient reports that when she was seen 10 days ago she was given IV antibiotics and was supposed to be sent with Augmentin, however she did not pick this up and there was an error at the pharmacy so she has not been using the antibiotics.  Today at dialysis she felt increased pressure area beneath her left breast and felt it opened and started draining.  She has not had any fevers or chills or myalgias.  She does not appear systemically ill, without fever or hypotension.  On exam the area beneath the left breast at the 5 o'clock position in reference to the nipple is open and draining from 3 separate areas with purulent and serosanguineous drainage.  This is tender to the touch, I do not feel any obvious axillary lymphadenopathy and there is some associated overlying erythema.  I spoke with general surgery who recommended we continue antibiotics and admit to medicine, but will be down to see the patient soon. On their evaluation, general surgery would like to take patient to OR for debridement, tissues biopsy. Patient admitted to hospitalist for cellulitis/IV antibiotics with general surgery consulting.   Final Clinical Impression(s) / ED Diagnoses Final diagnoses:  Breast abscess    Rx / DC Orders ED Discharge Orders    None       Camila Li, MD 01/16/21 [id number]    Lacretia Leigh, MD 01/17/21 2248

## 2021-01-16 ENCOUNTER — Other Ambulatory Visit: Payer: Self-pay

## 2021-01-16 ENCOUNTER — Inpatient Hospital Stay (HOSPITAL_COMMUNITY): Payer: Medicare HMO

## 2021-01-16 DIAGNOSIS — N611 Abscess of the breast and nipple: Secondary | ICD-10-CM | POA: Diagnosis not present

## 2021-01-16 DIAGNOSIS — U071 COVID-19: Secondary | ICD-10-CM | POA: Diagnosis not present

## 2021-01-16 LAB — CBC
HCT: 29.5 % — ABNORMAL LOW (ref 36.0–46.0)
Hemoglobin: 9.2 g/dL — ABNORMAL LOW (ref 12.0–15.0)
MCH: 30.9 pg (ref 26.0–34.0)
MCHC: 31.2 g/dL (ref 30.0–36.0)
MCV: 99 fL (ref 80.0–100.0)
Platelets: 331 10*3/uL (ref 150–400)
RBC: 2.98 MIL/uL — ABNORMAL LOW (ref 3.87–5.11)
RDW: 16.4 % — ABNORMAL HIGH (ref 11.5–15.5)
WBC: 8.2 10*3/uL (ref 4.0–10.5)
nRBC: 0 % (ref 0.0–0.2)

## 2021-01-16 LAB — COMPREHENSIVE METABOLIC PANEL
ALT: 12 U/L (ref 0–44)
AST: 15 U/L (ref 15–41)
Albumin: 2.5 g/dL — ABNORMAL LOW (ref 3.5–5.0)
Alkaline Phosphatase: 41 U/L (ref 38–126)
Anion gap: 15 (ref 5–15)
BUN: 20 mg/dL (ref 6–20)
CO2: 32 mmol/L (ref 22–32)
Calcium: 9 mg/dL (ref 8.9–10.3)
Chloride: 94 mmol/L — ABNORMAL LOW (ref 98–111)
Creatinine, Ser: 7.07 mg/dL — ABNORMAL HIGH (ref 0.44–1.00)
GFR, Estimated: 7 mL/min — ABNORMAL LOW (ref >60)
Glucose, Bld: 90 mg/dL (ref 70–99)
Potassium: 3.8 mmol/L (ref 3.5–5.1)
Sodium: 141 mmol/L (ref 135–145)
Total Bilirubin: 0.7 mg/dL (ref 0.3–1.2)
Total Protein: 6.5 g/dL (ref 6.5–8.1)

## 2021-01-16 LAB — D-DIMER, QUANTITATIVE: D-Dimer, Quant: 2.11 ug/mL-FEU — ABNORMAL HIGH (ref 0.00–0.50)

## 2021-01-16 LAB — MAGNESIUM: Magnesium: 2 mg/dL (ref 1.7–2.4)

## 2021-01-16 LAB — LACTATE DEHYDROGENASE: LDH: 118 U/L (ref 98–192)

## 2021-01-16 LAB — BRAIN NATRIURETIC PEPTIDE: B Natriuretic Peptide: 707.3 pg/mL — ABNORMAL HIGH (ref 0.0–100.0)

## 2021-01-16 LAB — C-REACTIVE PROTEIN: CRP: 17.7 mg/dL — ABNORMAL HIGH (ref <1.0)

## 2021-01-16 LAB — HIV ANTIBODY (ROUTINE TESTING W REFLEX): HIV Screen 4th Generation wRfx: NONREACTIVE

## 2021-01-16 LAB — PROCALCITONIN: Procalcitonin: 1.62 ng/mL

## 2021-01-16 LAB — FERRITIN: Ferritin: 1976 ng/mL — ABNORMAL HIGH (ref 11–307)

## 2021-01-16 MED ORDER — METHYLPREDNISOLONE SODIUM SUCC 125 MG IJ SOLR: 60.0000 mg | Freq: Two times a day (BID) | INTRAMUSCULAR | Status: DC

## 2021-01-16 MED ORDER — SODIUM CHLORIDE 0.9 % IV SOLN
3.0000 g | Freq: Every day | INTRAVENOUS | Status: DC
  Administered 2021-01-17 – 2021-01-22 (×6): 3 g via INTRAVENOUS
  Filled 2021-01-16 (×3): qty 8
  Filled 2021-01-16: qty 3
  Filled 2021-01-16: qty 8
  Filled 2021-01-16: qty 0.07
  Filled 2021-01-16: qty 8
  Filled 2021-01-16: qty 3

## 2021-01-16 MED ORDER — VANCOMYCIN HCL IN DEXTROSE 1-5 GM/200ML-% IV SOLN
1000.0000 mg | INTRAVENOUS | Status: DC
  Administered 2021-01-20: 1000 mg via INTRAVENOUS
  Filled 2021-01-16 (×2): qty 200

## 2021-01-16 MED ORDER — 0.9 % SODIUM CHLORIDE (POUR BTL) OPTIME
TOPICAL | Status: DC | PRN
  Administered 2021-01-15: 1000 mL

## 2021-01-16 MED ORDER — HYDROCODONE-ACETAMINOPHEN 5-325 MG PO TABS
1.0000 | ORAL_TABLET | ORAL | Status: DC | PRN
  Administered 2021-01-16 – 2021-01-28 (×45): 2 via ORAL
  Administered 2021-01-28: 1 via ORAL
  Administered 2021-01-28 – 2021-02-01 (×15): 2 via ORAL
  Administered 2021-02-02: 1 via ORAL
  Administered 2021-02-02 – 2021-02-09 (×23): 2 via ORAL
  Administered 2021-02-09: 1 via ORAL
  Administered 2021-02-10 – 2021-02-23 (×44): 2 via ORAL
  Filled 2021-01-16 (×65): qty 2
  Filled 2021-01-16: qty 1
  Filled 2021-01-16: qty 2
  Filled 2021-01-16: qty 1
  Filled 2021-01-16 (×64): qty 2

## 2021-01-16 MED ORDER — MORPHINE SULFATE (PF) 2 MG/ML IV SOLN
2.0000 mg | INTRAVENOUS | Status: DC | PRN
  Administered 2021-01-16 – 2021-01-20 (×12): 2 mg via INTRAVENOUS
  Filled 2021-01-16 (×12): qty 1

## 2021-01-16 MED ORDER — MORPHINE SULFATE (PF) 2 MG/ML IV SOLN
2.0000 mg | Freq: Once | INTRAVENOUS | Status: DC
  Filled 2021-01-16: qty 1

## 2021-01-16 MED ORDER — SODIUM CHLORIDE 0.9 % IV SOLN
3.0000 g | Freq: Once | INTRAVENOUS | Status: AC
  Administered 2021-01-16: 3 g via INTRAVENOUS
  Filled 2021-01-16: qty 8

## 2021-01-16 MED ORDER — VANCOMYCIN HCL 1500 MG/300ML IV SOLN
1500.0000 mg | Freq: Once | INTRAVENOUS | Status: AC
  Administered 2021-01-16: 1500 mg via INTRAVENOUS
  Filled 2021-01-16: qty 300

## 2021-01-16 NOTE — Progress Notes (Signed)
Patient had two covid test done at [facility].  One was done on Panther fusion machine and resulted negative.  The other test was done on the Cepheid PCR machine and was positive.  This was discussed with Shanon Brow, the Hydrographic surveyor on duty, and he said that since one test resulted positive we should treat the patient as covid positive.  Patient may need to be re-swabbed for confirmation.  Will defer plan of action to admitting physician and/or infectious disease.

## 2021-01-16 NOTE — Progress Notes (Signed)
PROGRESS NOTE                                                                                                                                                                                                             Patient Demographics:    Rebekah Burton, is a 45 y.o. female, DOB - 10/01/1976, RR:[id number]  Outpatient Primary MD for the patient is Trey Sailors, [place]    LOS - 1  Admit date - 01/15/2021    Chief Complaint  Patient presents with  . Abscess       Brief Narrative (HPI from H&P)   Rebekah Burton is a 45 y.o. female with medical history significant of ESRD-HD M-W-F, blindness, HTN, PAD had developed a breast abscess and was seen in ED 01/04/21 where U/S confirmed abscess, she was admitted for left breast abscess incision and drainage and was found to have incidental COVID infection.   Subjective:    Rebekah Burton today has, No headache, No chest pain, No abdominal pain - No Nausea, No new weakness tingling or numbness, +ve L breast pain, no SOB.   Assessment  & Plan :    Active Problems:   ESRD (end stage renal disease) ([place])   Breast abscess   COVID-19 virus infection   Breast abscess of female   1. Acute Covid 19 Infection  - she has no evidence of pulmonary involvement or COVID-specific symptoms, monitor.  CRP is elevated due to left breast abscess.  Encouraged the patient to sit up in chair in the daytime use I-S and flutter valve for pulmonary toiletry and then prone in bed when at night.  Will advance activity and titrate down oxygen as possible.  Actemra/Baricitinib  off label use - patient was told that if COVID-19 pneumonitis gets worse we might potentially use Actemra off label, patient denies any known history of active diverticulitis, tuberculosis or hepatitis, understands the risks and benefits and wants to proceed with Actemra treatment if required.     SpO2: 100 % O2 Flow Rate (L/min):  3 L/min  Recent Labs  Lab 01/15/21 1753 01/15/21 1755 01/15/21 1855 01/16/21 0512 01/16/21 0821  WBC  --   --  10.9*  --  8.2  HGB  --   --  9.6*  --  9.2*  HCT  --   --  30.0*  --  29.5*  PLT  --   --  347  --  331  CRP  --   --  17.8* 17.7*  --   BNP  --   --   --  707.3*  --   DDIMER  --   --   --  2.11*  --   PROCALCITON  --   --   --  1.62  --   AST  --   --   --  15  --   ALT  --   --   --  12  --   ALKPHOS  --   --   --  41  --   BILITOT  --   --   --  0.7  --   ALBUMIN  --   --   --  2.5*  --   SARSCOV2NAA POSITIVE* NEGATIVE  --   --   --     2. left breast abscess.  I&D done by general surgery, on vancomycin, Unasyn added.  Follow cultures.   3. ESRD.  MWF schedule, Renal following.  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  5.HTN - on home meds.          Condition - Fair  Family Communication  :  None  Code Status :  Full  Consults  :  CCS, Renal  Procedures  :    CT - : 1. There is extensive subcutaneous fat stranding and skin thickening of the left breast. There is a internally hypoattenuating mass or fluid collection in the subareolar left breast measuring 7.3 x 7.0 x 4.8 cm. Enlarged left axillary and subpectoral lymph nodes. This may reflect a benign breast abscess depending upon clinical presentation, but locally advanced malignancy with involvement of the skin and lymph nodes would be difficult to confidently distinguish based on CT. Recommend initial targeted ultrasound to assess for drainable fluid collection. 2. The central superior vena cava is narrowed and effaced with intraluminal calcifications and extensive superficial vascular collateralization about the chest wall, generally suggestive chronic central venous stenosis secondary to prior indwelling catheter access. 3. Coronary artery disease and aortic atherosclerosis, markedly advanced for patient age. Aortic Atherosclerosis  L.Breast I&D by CCS on 01/15/21   PUD Prophylaxis : None  Disposition Plan  :     Status is: Inpatient  Remains inpatient appropriate because:IV treatments appropriate due to intensity of illness or inability to take PO   Dispo: The patient is from: Home              Anticipated d/c is to: Home              Anticipated d/c date is: > 3 days              Patient currently is not medically stable to d/c.   Difficult to place patient No  DVT Prophylaxis  :   Heparin   Lab Results  Component Value Date   PLT 331 01/16/2021    Diet :  Diet Order            Diet renal with fluid restriction Fluid restriction: 1200 mL Fluid; Room service appropriate? Yes; Fluid consistency: Thin  Diet effective now                  Inpatient Medications  Scheduled Meds: . carvedilol  25 mg Oral BID WC  . cloNIDine  0.3 mg Oral TID  . heparin  5,000 Units Subcutaneous Q8H  . [  START ON 01/18/2021] lidocaine-prilocaine  1 application Topical Q M,W,F-HD  .  morphine injection  2 mg Intravenous Once  . multivitamin  1 tablet Oral QHS  . promethazine      . senna  1 tablet Oral BID  . sevelamer carbonate  3,200 mg Oral TID WC  . sodium chloride flush  3 mL Intravenous Q12H   Continuous Infusions: . sodium chloride    . [START ON 01/17/2021] remdesivir 100 mg in NS 100 mL    . [START ON 01/18/2021] vancomycin    . vancomycin     PRN Meds:.sodium chloride, acetaminophen **OR** [DISCONTINUED] acetaminophen, HYDROcodone-acetaminophen, morphine injection, sodium chloride flush  Antibiotics  :    Anti-infectives (From admission, onward)   Start     Dose/Rate Route Frequency Ordered Stop   01/18/21 1200  vancomycin (VANCOCIN) IVPB 1000 mg/200 mL premix        1,000 mg 200 mL/hr over 60 Minutes Intravenous Every M-W-F (Hemodialysis) 01/16/21 1017     01/17/21 1000  remdesivir 100 mg in sodium chloride 0.9 % 100 mL IVPB       "Followed by" Linked Group Details   100 mg 200 mL/hr over 30 Minutes Intravenous Daily 01/15/21 2357 01/19/21 0959   01/16/21 1100  vancomycin  (VANCOREADY) IVPB 1500 mg/300 mL        1,500 mg 150 mL/hr over 120 Minutes Intravenous  Once 01/16/21 1017     01/16/21 0300  remdesivir 200 mg in sodium chloride 0.9% 250 mL IVPB       "Followed by" Linked Group Details   200 mg 580 mL/hr over 30 Minutes Intravenous Once 01/15/21 2357 01/16/21 0543   01/15/21 2345  amoxicillin-clavulanate (AUGMENTIN) 875-125 MG per tablet 1 tablet  Status:  Discontinued        1 tablet Oral Every 12 hours 01/15/21 2334 01/15/21 2357   01/15/21 1800  vancomycin (VANCOCIN) IVPB 1000 mg/200 mL premix        1,000 mg 200 mL/hr over 60 Minutes Intravenous  Once 01/15/21 1753 01/15/21 2120       Time Spent in minutes  30   Lala Lund M.D on 01/16/2021 at 10:39 AM  To page go to [email or web address]   [facility] -  Office  [phone]    See all Orders from today for further details    Objective:   Vitals:   01/16/21 0142 01/16/21 0425 01/16/21 0429 01/16/21 0800  BP: (!) 156/90  111/80 140/90  Pulse: 66  78   Resp: 18  16   Temp: (!) 97.5 F (36.4 C)  97.7 F (36.5 C)   TempSrc: Oral  Oral   SpO2: 100%  100%   Weight: 107.7 kg 107.7 kg    Height: '5\' 4"'$  (1.626 m)       Wt Readings from Last 3 Encounters:  01/16/21 107.7 kg  10/23/20 109.3 kg  08/18/20 103.4 kg     Intake/Output Summary (Last 24 hours) at 01/16/2021 1039 Last data filed at 01/16/2021 0658 Gross per 24 hour  Intake 879.06 ml  Output 100 ml  Net 779.06 ml     Physical Exam  Awake Alert, No new F.N deficits, Normal affect Octa.AT,PERRAL Supple Neck,No JVD, No cervical lymphadenopathy appriciated.  Symmetrical Chest wall movement, Good air movement bilaterally, CTAB RRR,No Gallops,Rubs or new Murmurs, No Parasternal Heave +ve B.Sounds, Abd Soft, No tenderness, No organomegaly appriciated, No rebound - guarding or rigidity. L. Breast under bandage, was examined with surgery PA  on 01/16/21 - see CCS note & picture    Data Review:    CBC Recent Labs  Lab  01/15/21 1855 01/16/21 0821  WBC 10.9* 8.2  HGB 9.6* 9.2*  HCT 30.0* 29.5*  PLT 347 331  MCV 97.4 99.0  MCH 31.2 30.9  MCHC 32.0 31.2  RDW 16.3* 16.4*  LYMPHSABS 0.8  --   MONOABS 0.9  --   EOSABS 0.1  --   BASOSABS 0.0  --     Recent Labs  Lab 01/15/21 1855 01/16/21 0512  NA 140 141  K 3.1* 3.8  CL 93* 94*  CO2 34* 32  GLUCOSE 83 90  BUN 15 20  CREATININE 6.02* 7.07*  CALCIUM 9.1 9.0  AST  --  15  ALT  --  12  ALKPHOS  --  41  BILITOT  --  0.7  ALBUMIN  --  2.5*  MG  --  2.0  CRP 17.8* 17.7*  DDIMER  --  2.11*  PROCALCITON  --  1.62  BNP  --  707.3*    ------------------------------------------------------------------------------------------------------------------ No results for input(s): CHOL, HDL, LDLCALC, TRIG, CHOLHDL, LDLDIRECT in the last 72 hours.  Lab Results  Component Value Date   HGBA1C 5.2 01/16/2019   ------------------------------------------------------------------------------------------------------------------ No results for input(s): TSH, T4TOTAL, T3FREE, THYROIDAB in the last 72 hours.  Invalid input(s): FREET3  Cardiac Enzymes No results for input(s): CKMB, TROPONINI, MYOGLOBIN in the last 168 hours.  Invalid input(s): CK ------------------------------------------------------------------------------------------------------------------    Component Value Date/Time   BNP 707.3 (H) 01/16/2021 [id number]    Micro Results Recent Results (from the past 240 hour(s))  SARS Coronavirus 2 by RT PCR (hospital order, performed in [facility] hospital lab) Nasopharyngeal Nasopharyngeal Swab     Status: Abnormal   Collection Time: 01/15/21  5:53 PM   Specimen: Nasopharyngeal Swab  Result Value Ref Range Status   SARS Coronavirus 2 POSITIVE (A) NEGATIVE Final    Comment: RESULT CALLED TO, READ BACK BY AND VERIFIED WITH: Eustaquio Boyden RN 01/15/21 2209 JDW (NOTE) SARS-CoV-2 target nucleic acids are DETECTED  SARS-CoV-2 RNA is generally  detectable in upper respiratory specimens  during the acute phase of infection.  Positive results are indicative  of the presence of the identified virus, but do not rule out bacterial infection or co-infection with other pathogens not detected by the test.  Clinical correlation with patient history and  other diagnostic information is necessary to determine patient infection status.  The expected result is negative.  Fact Sheet for Patients:   StrictlyIdeas.no   Fact Sheet for Healthcare Providers:   BankingDealers.co.za    This test is not yet approved or cleared by the [place] FDA and  has been authorized for detection and/or diagnosis of SARS-CoV-2 by FDA under an Emergency Use Authorization (EUA).  This EUA will remain in effect (meaning this  test can be used) for the duration of  the COVID-19 declaration under Section 564(b)(1) of the Act, 21 U.S.C. section 360-bbb-3(b)(1), unless the authorization is terminated or revoked sooner.  Performed at [facility], [place] [address], [place], [place] [address]   SARS CORONAVIRUS 2 (TAT 6-24 HRS) Nasopharyngeal Nasopharyngeal Swab     Status: None   Collection Time: 01/15/21  5:55 PM   Specimen: Nasopharyngeal Swab  Result Value Ref Range Status   SARS Coronavirus 2 NEGATIVE NEGATIVE Final    Comment: (NOTE) SARS-CoV-2 target nucleic acids are NOT DETECTED.  The SARS-CoV-2 RNA is generally detectable  in upper and lower respiratory specimens during the acute phase of infection. Negative results do not preclude SARS-CoV-2 infection, do not rule out co-infections with other pathogens, and should not be used as the sole basis for treatment or other patient management decisions. Negative results must be combined with clinical observations, patient history, and epidemiological information. The expected result is Negative.  Fact Sheet for  Patients: SugarRoll.be  Fact Sheet for Healthcare Providers: https://www.woods-mathews.com/  This test is not yet approved or cleared by the [place] FDA and  has been authorized for detection and/or diagnosis of SARS-CoV-2 by FDA under an Emergency Use Authorization (EUA). This EUA will remain  in effect (meaning this test can be used) for the duration of the COVID-19 declaration under Se ction 564(b)(1) of the Act, 21 U.S.C. section 360bbb-3(b)(1), unless the authorization is terminated or revoked sooner.  Performed at [facility], [place] [address], [place], [place] [address]   Aerobic/Anaerobic Culture (surgical/deep wound)     Status: None (Preliminary result)   Collection Time: 01/16/21 12:09 AM   Specimen: Abscess  Result Value Ref Range Status   Specimen Description ABSCESS LEFT BREAST  Final   Special Requests NONE  Final   Gram Stain   Final    NO WBC SEEN FEW GRAM POSITIVE COCCI Performed at [facility], [address] [place], [place] [address]    Culture PENDING  Incomplete   Report Status PENDING  Incomplete    Radiology Reports CT Chest W Contrast  Result Date: 01/05/2021 CLINICAL DATA:  Soft tissue mass, breast mass versus infection EXAM: CT CHEST WITH CONTRAST TECHNIQUE: Multidetector CT imaging of the chest was performed during intravenous contrast administration. CONTRAST:  150m OMNIPAQUE IOHEXOL 300 MG/ML  SOLN COMPARISON:  None. FINDINGS: Cardiovascular: Aortic atherosclerosis. Normal heart size. Three-vessel coronary artery calcifications and/or stents. No pericardial effusion. The central superior vena cava is narrowed and effaced with intraluminal calcifications and extensive superficial vascular collateralization about the chest wall (series 3, image 43, series 6, image 75). Mediastinum/Nodes: No enlarged mediastinal or hilar lymph nodes. Enlarged left axillary and subpectoral lymph nodes measuring  up to 2.0 x 1.2 cm (series 3, image 25). Thyroid gland, trachea, and esophagus demonstrate no significant findings. Lungs/Pleura: Lungs are clear. No pleural effusion or pneumothorax. Upper Abdomen: No acute abnormality. Atrophic appearance of the partially included kidneys in the upper abdomen. Musculoskeletal: There is extensive subcutaneous fat stranding and skin thickening of the left breast. There is a internally hypoattenuating mass or fluid collection in the subareolar left breast measuring 7.3 x 7.0 x 4.8 cm (series 3, image 69, series 6, image 37). IMPRESSION: 1. There is extensive subcutaneous fat stranding and skin thickening of the left breast. There is a internally hypoattenuating mass or fluid collection in the subareolar left breast measuring 7.3 x 7.0 x 4.8 cm. Enlarged left axillary and subpectoral lymph nodes. This may reflect a benign breast abscess depending upon clinical presentation, but locally advanced malignancy with involvement of the skin and lymph nodes would be difficult to confidently distinguish based on CT. Recommend initial targeted ultrasound to assess for drainable fluid collection. 2. The central superior vena cava is narrowed and effaced with intraluminal calcifications and extensive superficial vascular collateralization about the chest wall, generally suggestive chronic central venous stenosis secondary to prior indwelling catheter access. 3. Coronary artery disease and aortic atherosclerosis, markedly advanced for patient age. Aortic Atherosclerosis (ICD10-I70.0). Electronically Signed   By: AEddie CandleM.D.   On:  01/05/2021 14:00   Portable chest 1 View  Result Date: 01/16/2021 CLINICAL DATA:  COVID-19. EXAM: PORTABLE CHEST 1 VIEW COMPARISON:  Chest CT 01/05/2021 FINDINGS: Cardiac contours upper limits of normal. No large area pulmonary consolidation. Minimal left basilar atelectasis. No pleural effusion or pneumothorax. Osseous structures unremarkable. IMPRESSION: No  acute cardiopulmonary process. Electronically Signed   By: Lovey Newcomer M.D.   On: 01/16/2021 08:13   US BREAST COMPLETE UNI LEFT INC AXILLA  Result Date: 01/05/2021 CLINICAL DATA:  Follow-up abnormal CT chest. EXAM: ULTRASOUND OF THE LEFT BREAST COMPARISON:  CT 01/05/2021. FINDINGS: Left breast ultrasound reveals a 6.2 x 4.5 x 7.2 cm complex cystic mass. Although tumor cannot be excluded, abscess and or hematoma would be more likely. Close follow-up exam suggested to demonstrate complete resolution in order to exclude a tumor. IMPRESSION: Left breast soft tissue edema. Left breast complex cystic mass. Although tumor cannot be excluded, abscess and or hematoma would be more likely. Aspiration of the left breast cystic mass should be considered. RECOMMENDATION: Aspiration of the left breast cystic mass should be considered. Close follow-up exams suggested to demonstrate complete resolution in order to exclude a tumor. I BI-RADS CATEGORY  BI-RADS category 4. Electronically Signed   By: Marcello Moores  Register   On: 01/05/2021 15:57

## 2021-01-16 NOTE — Consult Note (Signed)
[facility] Renal Consultation Note  Requesting MD: Lala Lund, MD Indication for Consultation:  ESRD  Chief complaint: breast abscess  HPI:  Rebekah Burton is a 45 y.o. female with a history of end-stage renal disease on hemodialysis Monday Wednesday Friday, hypertension blindness, and peripheral artery disease who came in for left breast abscess I&D.  She was found to have COVID.  She did get HD on 1/21, Friday.  Feels well and states that HD has been going well.  She's not on oxygen at home and has been 2.5 liters here.    PMHx:   Past Medical History:  Diagnosis Date  . Blind   . ESRD (end stage renal disease) ([place])   . Foot ulceration ([place]) 12/2018  . Hypertension   . PAD (peripheral artery disease) ([place])     Past Surgical History:  Procedure Laterality Date  . ABDOMINAL AORTOGRAM W/LOWER EXTREMITY Bilateral 01/17/2019   Procedure: ABDOMINAL AORTOGRAM W/LOWER EXTREMITY;  Surgeon: Waynetta Sandy, MD;  Location: [facility];  Service: Cardiovascular;  Laterality: Bilateral;  . ABDOMINAL HYSTERECTOMY    . ENDARTERECTOMY FEMORAL Left 01/18/2019   Procedure: ENDARTERECTOMY FEMORAL with Perfundaplasty;  Surgeon: Rosetta Posner, MD;  Location: [facility];  Service: Vascular;  Laterality: Left;  . FEMORAL-POPLITEAL BYPASS GRAFT Left 01/18/2019   Procedure: Left  FEMORAL- to  Below the Knee POPLITEAL ARTERY bypass using reversed safenous vein.;  Surgeon: Rosetta Posner, MD;  Location: [facility] OR;  Service: Vascular;  Laterality: Left;  . IR THROMBECTOMY AV FISTULA W/THROMBOLYSIS/PTA/STENT INC/SHUNT/IMG LT Left 10/23/2020  . toe removal Right    All toes on right foot have been removed.   . 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 Peripheral Artery Disease Neg Hx   .  Kidney failure Neg Hx     Social History:  reports that she quit smoking about 2 years ago. Her smoking use included cigarettes. She has a 10.00 pack-year smoking history. She has never used smokeless tobacco. She reports that she does not drink alcohol and does not use drugs.  Allergies:  Allergies  Allergen Reactions  . Percocet [Oxycodone-Acetaminophen] Hives    Medications: Prior to Admission medications   Medication Sig Start Date End Date Taking? Authorizing Provider  amoxicillin-clavulanate (AUGMENTIN) 875-125 MG tablet Take 1 tablet by mouth every 12 (twelve) hours. 01/05/21   Tacy Learn, PA-C  b complex-vitamin c-folic acid (NEPHRO-VITE) 0.8 MG TABS tablet Take 1 tablet by mouth every Monday, Wednesday, and Friday with hemodialysis. 11/20/18   [provider]  carvedilol (COREG) 25 MG tablet Take 25 mg by mouth 2 (two) times daily. 01/04/19   [provider]  cloNIDine (CATAPRES) 0.3 MG tablet Take 0.3 mg by mouth 3 (three) times daily. 01/01/21   [provider]  lidocaine-prilocaine (EMLA) cream Apply 1 application topically every Monday, Wednesday, and Friday with hemodialysis.  11/20/18   [provider]  oxyCODONE (OXY IR/ROXICODONE) 5 MG immediate release tablet Take 1 tablet (5 mg total) by mouth every 4 (four) hours as needed for moderate pain. Patient not taking: No sig reported 02/09/19   Elgergawy, Silver Huguenin, MD  RENVELA 800 MG tablet Take 800 mg by mouth See admin instructions. 4 tabs tid w/meals 11/20/18   [provider]  traMADol Veatrice Bourbon)  50 MG tablet Take 1 tablet (50 mg total) by mouth every 6 (six) hours as needed for moderate pain. Patient not taking: No sig reported 02/09/19   Elgergawy, Silver Huguenin, MD    I have reviewed the patient's current and reported prior to admission medications.  Labs:  BMP Latest Ref Rng & Units 01/16/2021 01/15/2021 01/05/2021  Glucose 70 - 99 mg/dL 90 83 84  BUN 6 - 20 mg/dL 20 15 21(H)   Creatinine 0.44 - 1.00 mg/dL 7.07(H) 6.02(H) 8.92(H)  Sodium 135 - 145 mmol/L 141 140 138  Potassium 3.5 - 5.1 mmol/L 3.8 3.1(L) 3.8  Chloride 98 - 111 mmol/L 94(L) 93(L) 95(L)  CO2 22 - 32 mmol/L 32 34(H) 27  Calcium 8.9 - 10.3 mg/dL 9.0 9.1 8.5(L)    ROS:  Pertinent items noted in HPI and remainder of comprehensive ROS otherwise negative.    Physical Exam: Vitals:   01/16/21 0429 01/16/21 0800  BP: 111/80 140/90  Pulse: 78   Resp: 16   Temp: 97.7 F (36.5 C)   SpO2: 100%      General: adult female in bed in NAD HEENT: NCAT Eyes: EOMI sclera anicteric Neck: supple trachea midline Heart: s1S2 no rub Lungs: clear to auscultation unlabored on 2.5 liters oxygen Abdomen: soft/nt/nd Extremities: no pitting edema; no cyanosis clubbing Skin: bandage intact over left chest; no rash on extremities exposed Neuro: alert and oriented x 3 provides hx and follows commands  Psych normal mood and affect Access; LUE AVF thrill and bruit  Outpatient HD  [facility] MWF  4 hrs 15 minutes 2K/2.5 calcium BF 400/DF 800 EDW 105 kg  AVF parsabiv 15 mg three times a week with HD mircera 150 mcg every 2 weeks - last given 01/13/21 hectorol 3 mcg three times a week  She got HD on 1/21  Assessment/Plan:  # End-stage renal disease - Hemodialysis is usually Monday Wednesday Friday - she got tx Friday as scheduled  - No emergent HD need - Note emergent staffing shortage - would discharge - Given ESRD would avoid morphine for pain  Left breast abscess Antibiotics per primary team  covid 19 infection  - Therapies per primary team  Acute hypoxic resp failure  - supportive care including oxygen  - optimize volume status as able with HD  - covid management as above  HTN  - acceptable control   Anemia CKD - ESA just given as above  Metabolic bone disease - check phos - continue hectorol  - not able to get parsabiv here  Disposition per primary team.  She will need to be on the  covid shift at her home unit [facility]) on discharge   Claudia Desanctis 01/16/2021, 11:49 AM

## 2021-01-16 NOTE — Progress Notes (Addendum)
UPDATE: Call placed to [facility] to confirm that there has been no change in their COVID isolation shift plan and was informed that there HAS been. They are currently running iso shift TTS 1:30pm-5:30pm. Transportation request submitted and patient called to update. Phone rang busy. Navigator will attempt again at a later time.   Renal Navigator notes patient's positive COVID test result and informed [facility]. Patient will need to dialyze in isolation at discharge on a MWF schedule 3rd shift for at least 14 days from positive test (01/15/21). Navigator spoke with patient who states she uses Medicaid transportation. She will not be able to use this while COVID positive. Navigator asked if she has any family or friends who can assist. She states no family in this area, but lives with a friend Sherlynn Carbon [phone], whom she asks Navigator to call. Navigator left message requesting a call back. Navigator does not think that [facility] will transport given patient's Medicare advantage plan, but [facility] can evaluate this. Navigator will request transport through [facility] and cancel request if friend can transport. At this time, patient does not have a safe discharge plan given transportation barrier to outpatient HD. Navigator following.  Alphonzo Cruise, [place] Renal Navigator [phone]

## 2021-01-16 NOTE — Progress Notes (Signed)
Pacu Nursing Note  Patient's belongings including her cellphone were brought with patient to her room 5W16.

## 2021-01-16 NOTE — Progress Notes (Signed)
Renal Navigator received call from patient's friend Sherlynn Carbon [phone] who reports she has allowed patient to live with her for the past 3 years while patient was supposed to be looking for her own living arrangements. Ms. Nikki Dom states she is moving back to [place] in February and it does not make any sense for patient to return to her home at this time. She added that she absolutely will not allow patient to return to her home while testing positive for COVID. Navigator will alert TOC team and send message to HD clinic Social Worker to see if there is any assistance she can provide.  Navigator will continue to follow.  Alphonzo Cruise, [place] Renal Navigator [phone]

## 2021-01-16 NOTE — Transfer of Care (Signed)
Immediate Anesthesia Transfer of Care Note  Patient: Rebekah Burton  Procedure(s) Performed: INCISION AND DRAINAGE BREAST ABSCESS (Left )  Patient Location: PACU  Anesthesia Type:General  Level of Consciousness: awake  Airway & Oxygen Therapy: Patient Spontanous Breathing and Patient connected to face mask oxygen  Post-op Assessment: Report given to RN and Post -op Vital signs reviewed and stable  Post vital signs: Reviewed and stable  Last Vitals:  Vitals Value Taken Time  BP    Temp    Pulse    Resp    SpO2      Last Pain:  Vitals:   01/15/21 2111  TempSrc:   PainSc: 10-Worst pain ever         Complications: No complications documented.

## 2021-01-16 NOTE — Progress Notes (Addendum)
Pharmacy Antibiotic Note  Rebekah Burton is a 45 y.o. female admitted on 01/15/2021 with L-breast abscess/cellulitis. Pharmacy has been consulted for Vancomycin dosing.  The patient received Vancomycin 1g in the ED on 1/21. ESRD-MWF. Last HD 1/21 outpatient, next presumably 1/24 however renal yet to see. Will complete the patient's load today and start maintenance doses on 1/24.   Addendum: Now with plans to also add Unasyn for coverage.   Plan: - Vancomycin 1500 mg IV x 1 (for total of 2500 mg loading dose) - Start Vancomycin 1g/HD-MWF on 1/24 - Start Unasyn 3g IV x 1 now then daily at bedtime starting 1/23 - Will continue to follow HD schedule/duration, culture results, LOT, and antibiotic de-escalation plans   Height: '5\' 4"'$  (162.6 cm) Weight: 107.7 kg (237 lb 7 oz) IBW/kg (Calculated) : 54.7  Temp (24hrs), Avg:97.8 F (36.6 C), Min:97.2 F (36.2 C), Max:98.4 F (36.9 C)  Recent Labs  Lab 01/15/21 1855 01/16/21 0512 01/16/21 0821  WBC 10.9*  --  8.2  CREATININE 6.02* 7.07*  --     Estimated Creatinine Clearance: 12.4 mL/min (A) (by C-G formula based on SCr of 7.07 mg/dL (H)).    Allergies  Allergen Reactions  . Percocet [Oxycodone-Acetaminophen] Hives    Antimicrobials this admission: Remdesvir 1/22 >> (1/24) Vancomycin 1/22 >> Unasyn 1/22 >>  Microbiology results: 1/21 COVID >> positive 1/21 L-breast abscess >> few GPC  Thank you for allowing pharmacy to be a part of this patient's care.  Rebekah Burton, PharmD, BCPS Clinical Pharmacist Clinical phone for 01/16/2021: [phone] 01/16/2021 10:14 AM   **Pharmacist phone directory can now be found on [email or web address] ([email or web address]).  Listed under [name].

## 2021-01-16 NOTE — Progress Notes (Signed)
[facility] Progress Note  1 Day Post-Op  Subjective: CC-  Left breast is very sore from surgery yesterday. Pain medication helps but wears off quickly. WBC down 8.2, afebrile  Objective: Vital signs in last 24 hours: Temp:  [97.2 F (36.2 C)-98.4 F (36.9 C)] 97.7 F (36.5 C) (01/22 0429) Pulse Rate:  [65-97] 78 (01/22 0429) Resp:  [13-20] 16 (01/22 0429) BP: (111-156)/(66-94) 140/90 (01/22 0800) SpO2:  [89 %-100 %] 100 % (01/22 0429) Weight:  [100.2 kg-107.7 kg] 107.7 kg (01/22 0425) Last BM Date: 01/14/21  Intake/Output from previous day: 01/21 0701 - 01/22 0700 In: 879.1 [I.V.:400; IV Piggyback:479.1] Out: 100 [Blood:100] Intake/Output this shift: No intake/output data recorded.  PE: Gen:  Alert, NAD, pleasant Pulm:  rate and effort normal Abd: Soft, NT/ND Skin: warm and dry Left breast: wound ~4x6cm and very deep with some surrounding cellulitis/induration, no purulent drainage and no active bleeding     Lab Results:  Recent Labs    01/15/21 1855 01/16/21 0821  WBC 10.9* 8.2  HGB 9.6* 9.2*  HCT 30.0* 29.5*  PLT 347 331   BMET Recent Labs    01/15/21 1855 01/16/21 0512  NA 140 141  K 3.1* 3.8  CL 93* 94*  CO2 34* 32  GLUCOSE 83 90  BUN 15 20  CREATININE 6.02* 7.07*  CALCIUM 9.1 9.0   PT/INR No results for input(s): LABPROT, INR in the last 72 hours. CMP     Component Value Date/Time   NA 141 01/16/2021 0512   K 3.8 01/16/2021 0512   CL 94 (L) 01/16/2021 0512   CO2 32 01/16/2021 0512   GLUCOSE 90 01/16/2021 0512   BUN 20 01/16/2021 0512   CREATININE 7.07 (H) 01/16/2021 0512   CALCIUM 9.0 01/16/2021 0512   PROT 6.5 01/16/2021 0512   ALBUMIN 2.5 (L) 01/16/2021 0512   AST 15 01/16/2021 0512   ALT 12 01/16/2021 0512   ALKPHOS 41 01/16/2021 0512   BILITOT 0.7 01/16/2021 0512   GFRNONAA 7 (L) 01/16/2021 0512   GFRAA 5 (L) 02/13/2019 0734   Lipase     Component Value Date/Time   LIPASE 33 07/17/2018 1145        Studies/Results: Portable chest 1 View  Result Date: 01/16/2021 CLINICAL DATA:  COVID-19. EXAM: PORTABLE CHEST 1 VIEW COMPARISON:  Chest CT 01/05/2021 FINDINGS: Cardiac contours upper limits of normal. No large area pulmonary consolidation. Minimal left basilar atelectasis. No pleural effusion or pneumothorax. Osseous structures unremarkable. IMPRESSION: No acute cardiopulmonary process. Electronically Signed   By: Lovey Newcomer M.D.   On: 01/16/2021 08:13    Anti-infectives: Anti-infectives (From admission, onward)   Start     Dose/Rate Route Frequency Ordered Stop   01/17/21 1000  remdesivir 100 mg in sodium chloride 0.9 % 100 mL IVPB       "Followed by" Linked Group Details   100 mg 200 mL/hr over 30 Minutes Intravenous Daily 01/15/21 2357 01/19/21 0959   01/16/21 0300  remdesivir 200 mg in sodium chloride 0.9% 250 mL IVPB       "Followed by" Linked Group Details   200 mg 580 mL/hr over 30 Minutes Intravenous Once 01/15/21 2357 01/16/21 0543   01/15/21 2345  amoxicillin-clavulanate (AUGMENTIN) 875-125 MG per tablet 1 tablet  Status:  Discontinued        1 tablet Oral Every 12 hours 01/15/21 2334 01/15/21 2357   01/15/21 1800  vancomycin (VANCOCIN) IVPB 1000 mg/200 mL premix  1,000 mg 200 mL/hr over 60 Minutes Intravenous  Once 01/15/21 1753 01/15/21 2120       Assessment/Plan ESRD PAD Blind Chronic anemia Obesity BMI 40.76 COVID+  Left breast abscess S/p Incision and drainage of left breast abscess with excisional debridement 1/21 Dr. Zenia Resides - POD#1 - concern for underlying malignancy, Path and Cx pending - First dressing change performed this morning, patient tolerated well. Continue BID wet to dry dressing changes. Ok to shower with wound open. Continue IV antibiotics and follow cx and path. Home health orders placed. She is going to try and think of who can help her with dressing changes at home, she lives with a room mate who is not home often. She is blind  and will not be able to perform independently.   ID - vancomycin 1/21 FEN - renal diet VTE - SCDs, sq heparin Foley - none   LOS: 1 day    Wellington Hampshire, [facility] Surgery 01/16/2021, 9:26 AM Please see Amion for pager number during day hours 7:00am-4:30pm

## 2021-01-16 NOTE — Plan of Care (Signed)

## 2021-01-17 DIAGNOSIS — U071 COVID-19: Secondary | ICD-10-CM | POA: Diagnosis not present

## 2021-01-17 DIAGNOSIS — N611 Abscess of the breast and nipple: Secondary | ICD-10-CM | POA: Diagnosis not present

## 2021-01-17 LAB — CBC WITH DIFFERENTIAL/PLATELET
Abs Immature Granulocytes: 0.05 10*3/uL (ref 0.00–0.07)
Basophils Absolute: 0 10*3/uL (ref 0.0–0.1)
Basophils Relative: 1 %
Eosinophils Absolute: 0.3 10*3/uL (ref 0.0–0.5)
Eosinophils Relative: 4 %
HCT: 27.5 % — ABNORMAL LOW (ref 36.0–46.0)
Hemoglobin: 8.4 g/dL — ABNORMAL LOW (ref 12.0–15.0)
Immature Granulocytes: 1 %
Lymphocytes Relative: 15 %
Lymphs Abs: 1 10*3/uL (ref 0.7–4.0)
MCH: 30.5 pg (ref 26.0–34.0)
MCHC: 30.5 g/dL (ref 30.0–36.0)
MCV: 100 fL (ref 80.0–100.0)
Monocytes Absolute: 0.4 10*3/uL (ref 0.1–1.0)
Monocytes Relative: 6 %
Neutro Abs: 4.9 10*3/uL (ref 1.7–7.7)
Neutrophils Relative %: 73 %
Platelets: 315 10*3/uL (ref 150–400)
RBC: 2.75 MIL/uL — ABNORMAL LOW (ref 3.87–5.11)
RDW: 16.4 % — ABNORMAL HIGH (ref 11.5–15.5)
WBC: 6.6 10*3/uL (ref 4.0–10.5)
nRBC: 0 % (ref 0.0–0.2)

## 2021-01-17 LAB — COMPREHENSIVE METABOLIC PANEL
ALT: 6 U/L (ref 0–44)
AST: 11 U/L — ABNORMAL LOW (ref 15–41)
Albumin: 2.2 g/dL — ABNORMAL LOW (ref 3.5–5.0)
Alkaline Phosphatase: 35 U/L — ABNORMAL LOW (ref 38–126)
Anion gap: 15 (ref 5–15)
BUN: 29 mg/dL — ABNORMAL HIGH (ref 6–20)
CO2: 29 mmol/L (ref 22–32)
Calcium: 8.9 mg/dL (ref 8.9–10.3)
Chloride: 94 mmol/L — ABNORMAL LOW (ref 98–111)
Creatinine, Ser: 8.84 mg/dL — ABNORMAL HIGH (ref 0.44–1.00)
GFR, Estimated: 5 mL/min — ABNORMAL LOW (ref >60)
Glucose, Bld: 134 mg/dL — ABNORMAL HIGH (ref 70–99)
Potassium: 3.7 mmol/L (ref 3.5–5.1)
Sodium: 138 mmol/L (ref 135–145)
Total Bilirubin: 0.7 mg/dL (ref 0.3–1.2)
Total Protein: 5.9 g/dL — ABNORMAL LOW (ref 6.5–8.1)

## 2021-01-17 LAB — MAGNESIUM: Magnesium: 2 mg/dL (ref 1.7–2.4)

## 2021-01-17 LAB — PROCALCITONIN: Procalcitonin: 1.46 ng/mL

## 2021-01-17 LAB — D-DIMER, QUANTITATIVE: D-Dimer, Quant: 2.24 ug/mL-FEU — ABNORMAL HIGH (ref 0.00–0.50)

## 2021-01-17 LAB — BRAIN NATRIURETIC PEPTIDE: B Natriuretic Peptide: 1072.2 pg/mL — ABNORMAL HIGH (ref 0.0–100.0)

## 2021-01-17 LAB — C-REACTIVE PROTEIN: CRP: 14.1 mg/dL — ABNORMAL HIGH (ref <1.0)

## 2021-01-17 MED ORDER — CHLORHEXIDINE GLUCONATE CLOTH 2 % EX PADS
6.0000 | MEDICATED_PAD | Freq: Every day | CUTANEOUS | Status: DC
  Administered 2021-01-18 – 2021-01-24 (×4): 6 via TOPICAL

## 2021-01-17 NOTE — Progress Notes (Signed)
[facility] Progress Note  2 Days Post-Op  Subjective: CC-  Comfortable this morning. Tolerating dressing changes well. WBC 6.6, afebrile.   Objective: Vital signs in last 24 hours: Temp:  [97.5 F (36.4 C)-98.6 F (37 C)] 97.5 F (36.4 C) (01/23 [id number]) Pulse Rate:  [77-87] 77 (01/22 2131) Resp:  [14-16] 16 (01/23 0642) BP: (101-135)/(66-81) 122/66 (01/23 0900) SpO2:  [100 %] 100 % (01/23 0642) Last BM Date: 01/14/21  Intake/Output from previous day: 01/22 0701 - 01/23 0700 In: 60 [P.O.:60] Out: -  Intake/Output this shift: Total I/O In: 3 [I.V.:3] Out: -   PE: Gen:  Alert, NAD, pleasant Pulm:  rate and effort normal Abd: Soft, NT/ND Skin: warm and dry Left breast: wound ~4x6cm and very deep with some surrounding cellulitis/induration >> cellulitis improving, induration persists from 9 to 12 o'clock positions, no purulent drainage and no active bleeding   Lab Results:  Recent Labs    01/16/21 0821 01/17/21 0204  WBC 8.2 6.6  HGB 9.2* 8.4*  HCT 29.5* 27.5*  PLT 331 315   BMET Recent Labs    01/16/21 0512 01/17/21 0204  NA 141 138  K 3.8 3.7  CL 94* 94*  CO2 32 29  GLUCOSE 90 134*  BUN 20 29*  CREATININE 7.07* 8.84*  CALCIUM 9.0 8.9   PT/INR No results for input(s): LABPROT, INR in the last 72 hours. CMP     Component Value Date/Time   NA 138 01/17/2021 0204   K 3.7 01/17/2021 0204   CL 94 (L) 01/17/2021 0204   CO2 29 01/17/2021 0204   GLUCOSE 134 (H) 01/17/2021 0204   BUN 29 (H) 01/17/2021 0204   CREATININE 8.84 (H) 01/17/2021 0204   CALCIUM 8.9 01/17/2021 0204   PROT 5.9 (L) 01/17/2021 0204   ALBUMIN 2.2 (L) 01/17/2021 0204   AST 11 (L) 01/17/2021 0204   ALT 6 01/17/2021 0204   ALKPHOS 35 (L) 01/17/2021 0204   BILITOT 0.7 01/17/2021 0204   GFRNONAA 5 (L) 01/17/2021 0204   GFRAA 5 (L) 02/13/2019 0734   Lipase     Component Value Date/Time   LIPASE 33 07/17/2018 1145       Studies/Results: Portable chest 1  View  Result Date: 01/16/2021 CLINICAL DATA:  COVID-19. EXAM: PORTABLE CHEST 1 VIEW COMPARISON:  Chest CT 01/05/2021 FINDINGS: Cardiac contours upper limits of normal. No large area pulmonary consolidation. Minimal left basilar atelectasis. No pleural effusion or pneumothorax. Osseous structures unremarkable. IMPRESSION: No acute cardiopulmonary process. Electronically Signed   By: Lovey Newcomer M.D.   On: 01/16/2021 08:13    Anti-infectives: Anti-infectives (From admission, onward)   Start     Dose/Rate Route Frequency Ordered Stop   01/18/21 1200  vancomycin (VANCOCIN) IVPB 1000 mg/200 mL premix        1,000 mg 200 mL/hr over 60 Minutes Intravenous Every M-W-F (Hemodialysis) 01/16/21 1017     01/17/21 2200  Ampicillin-Sulbactam (UNASYN) 3 g in sodium chloride 0.9 % 100 mL IVPB        3 g 200 mL/hr over 30 Minutes Intravenous Daily at bedtime 01/16/21 1058     01/17/21 1000  remdesivir 100 mg in sodium chloride 0.9 % 100 mL IVPB       "Followed by" Linked Group Details   100 mg 200 mL/hr over 30 Minutes Intravenous Daily 01/15/21 2357 01/19/21 0959   01/16/21 1145  Ampicillin-Sulbactam (UNASYN) 3 g in sodium chloride 0.9 % 100 mL IVPB  3 g 200 mL/hr over 30 Minutes Intravenous  Once 01/16/21 1058 01/16/21 1417   01/16/21 1100  vancomycin (VANCOREADY) IVPB 1500 mg/300 mL        1,500 mg 150 mL/hr over 120 Minutes Intravenous  Once 01/16/21 1017 01/16/21 1849   01/16/21 0300  remdesivir 200 mg in sodium chloride 0.9% 250 mL IVPB       "Followed by" Linked Group Details   200 mg 580 mL/hr over 30 Minutes Intravenous Once 01/15/21 2357 01/16/21 0543   01/15/21 2345  amoxicillin-clavulanate (AUGMENTIN) 875-125 MG per tablet 1 tablet  Status:  Discontinued        1 tablet Oral Every 12 hours 01/15/21 2334 01/15/21 2357   01/15/21 1800  vancomycin (VANCOCIN) IVPB 1000 mg/200 mL premix        1,000 mg 200 mL/hr over 60 Minutes Intravenous  Once 01/15/21 1753 01/15/21 2120        Assessment/Plan ESRD PAD Blind Chronic anemia Obesity BMI 40.76 COVID+  Left breast abscess S/p Incision and drainage of left breast abscess with excisional debridement 1/21 Dr. Zenia Resides - POD#2 - concern for underlying malignancy, Path and Cx pending - Continue BID wet to dry dressing changes. Ok to shower with wound open. Continue IV antibiotics and follow cx and path. Home health orders have been placed, but patient unsure where she will go upon discharge. May be able to stay with boyfriend in [place].  ID - vancomycin 1/21>>, unasyn 1/22>> FEN - renal diet VTE - SCDs, sq heparin Foley - none    LOS: 2 days    Wellington Hampshire, [facility] Surgery 01/17/2021, 11:21 AM Please see Amion for pager number during day hours 7:00am-4:30pm

## 2021-01-17 NOTE — Progress Notes (Signed)
[facility] Progress Note  Name: Rebekah Burton MRN: [id number] DOB: Oct 04, 1976   Subjective:  last HD 1/21 as outpatient.  Not able to return home to her friend's house where she lived before but she tells me that she thinks she has a place in [facility] but can't go while covid positive.  I asked her to discuss with HD SW here.   Review of systems:  Denies shortness of breath or chest pain Denies n/v Visually impaired/blind -------- Background on consult:  Rebekah Burton is a 45 y.o. female with a history of end-stage renal disease on hemodialysis Monday Wednesday Friday, hypertension blindness, and peripheral artery disease who came in for left breast abscess I&D.  She was found to have COVID.  She did get HD on 1/21, Friday.  Feels well and states that HD has been going well.  She's not on oxygen at home and has been 2.5 liters here.    Intake/Output Summary (Last 24 hours) at 01/17/2021 1029 Last data filed at 01/17/2021 [id number] Gross per 24 hour  Intake 63 ml  Output --  Net 63 ml    Vitals:  Vitals:   01/16/21 2131 01/16/21 2222 01/17/21 0642 01/17/21 0900  BP: 101/70 122/81 115/76 122/66  Pulse: 77     Resp: 14  16   Temp: 98.3 F (36.8 C)  (!) 97.5 F (36.4 C)   TempSrc: Oral  Oral   SpO2: 100%  100%   Weight:      Height:         Physical Exam:  General: adult female in bed in NAD HEENT: NCAT Eyes: EOMI sclera anicteric Neck: supple trachea midline Heart: s1S2 no rub Lungs: clear to auscultation unlabored on 2.5 liters oxygen Abdomen: soft/nt/nd Extremities: no pitting edema; no cyanosis clubbing Skin: bandage intact over left chest; no rash on extremities exposed Neuro: alert and oriented x 3 provides hx and follows commands  Psych normal mood and affect Access; LUE AVF thrill and bruit  Medications reviewed   Labs:  BMP Latest Ref Rng & Units 01/17/2021 01/16/2021 01/15/2021  Glucose 70 - 99 mg/dL 134(H) 90 83  BUN 6 - 20 mg/dL 29(H) 20  15  Creatinine 0.44 - 1.00 mg/dL 8.84(H) 7.07(H) 6.02(H)  Sodium 135 - 145 mmol/L 138 141 140  Potassium 3.5 - 5.1 mmol/L 3.7 3.8 3.1(L)  Chloride 98 - 111 mmol/L 94(L) 94(L) 93(L)  CO2 22 - 32 mmol/L 29 32 34(H)  Calcium 8.9 - 10.3 mg/dL 8.9 9.0 9.1   Outpatient HD  [facility] MWF  4 hrs 15 minutes 2K/2.5 calcium BF 400/DF 800 EDW 105 kg  AVF parsabiv 15 mg three times a week with HD mircera 150 mcg every 2 weeks - last given 01/13/21 hectorol 3 mcg three times a week  She got HD on 1/21   Assessment/Plan:   # End-stage renal disease - HD per MWF schedule - plan for HD tomorrow  - Given ESRD would avoid morphine for pain  # Left breast abscess - Antibiotics per primary team  # covid 19 infection  - Therapies per primary team  # Acute hypoxic resp failure  - supportive care including oxygen  - optimize volume status as able with HD  - covid management as above  # HTN  - acceptable control   # Anemia CKD - ESA just given as above  # Metabolic bone disease - check phos - continue hectorol  - not able to get parsabiv here  Disposition per primary team.  She will need to be on the covid shift at her home unit [facility]) on discharge while in isolation window.  Per HD SW there is not a place for the patient to live as she had been staying with a friend who is moving out of state soon and who will not allow her to return to her current home while testing positive for covid   Claudia Desanctis, MD 01/17/2021 10:56 AM

## 2021-01-17 NOTE — Evaluation (Signed)
Physical Therapy Evaluation Patient Details Name: Rebekah Burton MRN: [id number] DOB: 04/27/1976 Today's Date: 01/17/2021   History of Present Illness  45 y.o. female with medical history significant of ESRD-HD M-W-F, blindness, HTN, PAD had developed a breast abscess and was seen in ED 01/04/21 where U/S confirmed abscess. She was to have been seen as outpatient for aspiration/drainage but did not get to make that appt. Today in HD the breast abscess opened and drained spontaneously. Because of continued drainage she presents to [facility] for evaluation. Found to be COVID+ taken to OR for further exploration, culture, and excisional debridement 01/15/21  Clinical Impression  PTA pt living with friend in single story home with level entry. Pt reports independence in that environment, able to perform ADLs and mobilize without assist. Pt has transportation for HD. Unfortunately, her roommate is moving the end of this month and will not allow pt back while COVID+. Pt plan had been to live with boyfriend in [facility], however boyfriend does not want her to come until she is outside her COVID window. Pt is mod I for bed mobility, and transfers and min A with multimodal cuing to ambulate around obstacles in the room. Pt will not have any PT needs at discharge, however PT will continue to follow acutely to progress mobility.     Follow Up Recommendations No PT follow up;Supervision for mobility/OOB    Equipment Recommendations  None recommended by PT    Recommendations for Other Services OT consult     Precautions / Restrictions Precautions Precautions: Other (comment) Precaution Comments: pt is blind, needs assist for navigation Restrictions Weight Bearing Restrictions: No      Mobility  Bed Mobility Overal bed mobility: Modified Independent             General bed mobility comments: use of bed rails and HoB elevated    Transfers Overall transfer level: Modified independent Equipment  used: None             General transfer comment: bracing against EoB to self steady  Ambulation/Gait Ambulation/Gait assistance: Min assist Gait Distance (Feet): 20 Feet Assistive device: 1 person hand held assist Gait Pattern/deviations: Step-through pattern;Decreased step length - right;Decreased step length - left Gait velocity: slowed Gait velocity interpretation: 1.31 - 2.62 ft/sec, indicative of limited community ambulator General Gait Details: min A for guidance and maximal multimodal cues to navigate obstacles in room     Balance Overall balance assessment: Needs assistance Sitting-balance support: No upper extremity supported;Feet supported Sitting balance-Leahy Scale: Normal     Standing balance support: During functional activity;No upper extremity supported Standing balance-Leahy Scale: Fair                               Pertinent Vitals/Pain Pain Assessment: 0-10 Pain Score: 6  Pain Location: L breast abcess Pain Descriptors / Indicators: Aching;Sore;Throbbing Pain Intervention(s): Limited activity within patient's tolerance;Monitored during session;Repositioned    Home Living Family/patient expects to be discharged to:: Private residence Living Arrangements: Other (Comment) (was living with room mate but she is leaving for [place] and pt can not return to that apartment now hoping to live with boyfriend but he will not let her come until she no longer needs COVID quarentine)   Type of Home: Apartment Home Access: Level entry     Home Layout: One level Home Equipment: None      Prior Function Level of Independence: Needs assistance   Gait /  Transfers Assistance Needed: independent with household ambulation in familiar environment, needs assist for directions in unfamilar environments  ADL's / Homemaking Assistance Needed: independent in ADLs, needs assist for iADLs, has transport to HD        Hand Dominance   Dominant Hand: Right     Extremity/Trunk Assessment   Upper Extremity Assessment Upper Extremity Assessment: Overall WFL for tasks assessed    Lower Extremity Assessment Lower Extremity Assessment: Overall WFL for tasks assessed    Cervical / Trunk Assessment Cervical / Trunk Assessment: Normal  Communication   Communication: No difficulties  Cognition Arousal/Alertness: Awake/alert Behavior During Therapy: WFL for tasks assessed/performed Overall Cognitive Status: Within Functional Limits for tasks assessed                                        General Comments General comments (skin integrity, edema, etc.): VSS on RA        Assessment/Plan    PT Assessment Patent does not need any further PT services         PT Goals (Current goals can be found in the Care Plan section)  Acute Rehab PT Goals Patient Stated Goal: have less pain PT Goal Formulation: With patient Time For Goal Achievement: 01/31/21 Potential to Achieve Goals: Good     AM-PAC PT "6 Clicks" Mobility  Outcome Measure Help needed turning from your back to your side while in a flat bed without using bedrails?: None Help needed moving from lying on your back to sitting on the side of a flat bed without using bedrails?: None Help needed moving to and from a bed to a chair (including a wheelchair)?: None Help needed standing up from a chair using your arms (e.g., wheelchair or bedside chair)?: None Help needed to walk in hospital room?: A Little Help needed climbing 3-5 steps with a railing? : A Little 6 Click Score: 22    End of Session   Activity Tolerance: Patient tolerated treatment well Patient left: in bed;with call bell/phone within reach;with nursing/sitter in room Nurse Communication: Mobility status PT Visit Diagnosis: Other abnormalities of gait and mobility (R26.89);Pain Pain - part of body:  (L breast)    Time: [id number] PT Time Calculation (min) (ACUTE ONLY): 19 min   Charges:   PT  Evaluation $PT Eval Moderate Complexity: 1 Mod          Karmella Bouvier B. Migdalia Dk PT, DPT Acute Rehabilitation Services Pager [phone] Office [phone]   Winter 01/17/2021, 3:44 PM

## 2021-01-17 NOTE — Anesthesia Postprocedure Evaluation (Signed)
Anesthesia Post Note  Patient: Rebekah Burton  Procedure(s) Performed: INCISION AND DRAINAGE BREAST ABSCESS (Left Breast)     Patient location during evaluation: PACU Anesthesia Type: General Level of consciousness: awake and alert Pain management: pain level controlled Vital Signs Assessment: post-procedure vital signs reviewed and stable Respiratory status: spontaneous breathing, nonlabored ventilation, respiratory function stable and patient connected to nasal cannula oxygen Cardiovascular status: blood pressure returned to baseline and stable Postop Assessment: no apparent nausea or vomiting Anesthetic complications: no   No complications documented.  Last Vitals:  Vitals:   01/16/21 2222 01/17/21 0642  BP: 122/81 115/76  Pulse:    Resp:  16  Temp:  (!) 36.4 C  SpO2:  100%    Last Pain:  Vitals:   01/17/21 0642  TempSrc: Oral  PainSc: 9                  Tiajuana Amass

## 2021-01-17 NOTE — Progress Notes (Signed)
PROGRESS NOTE                                                                                                                                                                                                             Patient Demographics:    Rebekah Burton, is a 45 y.o. female, DOB - 1976/05/11, PB:[id number]  Outpatient Primary MD for the patient is Trey Sailors, [place]    LOS - 2  Admit date - 01/15/2021    Chief Complaint  Patient presents with  . Abscess       Brief Narrative (HPI from H&P)   Rebekah Burton is a 45 y.o. female with medical history significant of ESRD-HD M-W-F, blindness, HTN, PAD had developed a breast abscess and was seen in ED 01/04/21 where U/S confirmed abscess, she was admitted for left breast abscess incision and drainage and was found to have incidental COVID infection.   Subjective:   Patient in bed, appears comfortable, denies any headache, no fever, no chest pain or pressure, no shortness of breath , no abdominal pain. No focal weakness.    Assessment  & Plan :     1. Acute Covid 19 Infection  - she has no evidence of pulmonary involvement or COVID-specific symptoms, monitor.  CRP is elevated due to left breast abscess.  Encouraged the patient to sit up in chair in the daytime use I-S and flutter valve for pulmonary toiletry and then prone in bed when at night.  Will advance activity and titrate down oxygen as possible.  SpO2: 100 % O2 Flow Rate (L/min): 2 L/min  Recent Labs  Lab 01/15/21 1753 01/15/21 1755 01/15/21 1855 01/16/21 0512 01/16/21 0821 01/17/21 0204 01/17/21 0205  WBC  --   --  10.9*  --  8.2 6.6  --   HGB  --   --  9.6*  --  9.2* 8.4*  --   HCT  --   --  30.0*  --  29.5* 27.5*  --   PLT  --   --  347  --  331 315  --   CRP  --   --  17.8* 17.7*  --  14.1*  --   BNP  --   --   --  707.3*  --   --  [id number]*  DDIMER  --   --   --  2.11*  --  2.24*  --   PROCALCITON   --   --   --  1.62  --   --   --   AST  --   --   --  15  --  11*  --   ALT  --   --   --  12  --  6  --   ALKPHOS  --   --   --  41  --  35*  --   BILITOT  --   --   --  0.7  --  0.7  --   ALBUMIN  --   --   --  2.5*  --  2.2*  --   SARSCOV2NAA POSITIVE* NEGATIVE  --   --   --   --   --     2. left breast abscess.  I&D done by general surgery, on vancomycin, Unasyn added.  Follow cultures, prelim growing gram-positive cocci.   3. ESRD.  MWF schedule, Renal following.  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  5.HTN - on home meds.          Condition - Fair  Family Communication  :  None  Code Status :  Full  Consults  :  CCS, Renal  Procedures  :    CT - : 1. There is extensive subcutaneous fat stranding and skin thickening of the left breast. There is a internally hypoattenuating mass or fluid collection in the subareolar left breast measuring 7.3 x 7.0 x 4.8 cm. Enlarged left axillary and subpectoral lymph nodes. This may reflect a benign breast abscess depending upon clinical presentation, but locally advanced malignancy with involvement of the skin and lymph nodes would be difficult to confidently distinguish based on CT. Recommend initial targeted ultrasound to assess for drainable fluid collection. 2. The central superior vena cava is narrowed and effaced with intraluminal calcifications and extensive superficial vascular collateralization about the chest wall, generally suggestive chronic central venous stenosis secondary to prior indwelling catheter access. 3. Coronary artery disease and aortic atherosclerosis, markedly advanced for patient age. Aortic Atherosclerosis  L.Breast I&D by CCS on 01/15/21   PUD Prophylaxis : None  Disposition Plan  :    Status is: Inpatient  Remains inpatient appropriate because:IV treatments appropriate due to intensity of illness or inability to take PO   Dispo: The patient is from: Home              Anticipated d/c is to: Home              Anticipated  d/c date is: > 3 days              Patient currently is not medically stable to d/c.   Difficult to place patient No  DVT Prophylaxis  :   Heparin   Lab Results  Component Value Date   PLT 315 01/17/2021    Diet :  Diet Order            Diet renal with fluid restriction Fluid restriction: 1200 mL Fluid; Room service appropriate? Yes; Fluid consistency: Thin  Diet effective now                  Inpatient Medications  Scheduled Meds: . carvedilol  25 mg Oral BID WC  . cloNIDine  0.3 mg Oral TID  . heparin  5,000 Units Subcutaneous Q8H  . [START ON 01/18/2021] lidocaine-prilocaine  1 application Topical Q M,W,F-HD  .  morphine  injection  2 mg Intravenous Once  . multivitamin  1 tablet Oral QHS  . senna  1 tablet Oral BID  . sevelamer carbonate  3,200 mg Oral TID WC  . sodium chloride flush  3 mL Intravenous Q12H   Continuous Infusions: . sodium chloride    . ampicillin-sulbactam (UNASYN) IV    . remdesivir 100 mg in NS 100 mL 100 mg (01/17/21 1127)  . [START ON 01/18/2021] vancomycin     PRN Meds:.sodium chloride, acetaminophen **OR** [DISCONTINUED] acetaminophen, HYDROcodone-acetaminophen, morphine injection, sodium chloride flush  Antibiotics  :    Anti-infectives (From admission, onward)   Start     Dose/Rate Route Frequency Ordered Stop   01/18/21 1200  vancomycin (VANCOCIN) IVPB 1000 mg/200 mL premix        1,000 mg 200 mL/hr over 60 Minutes Intravenous Every M-W-F (Hemodialysis) 01/16/21 1017     01/17/21 2200  Ampicillin-Sulbactam (UNASYN) 3 g in sodium chloride 0.9 % 100 mL IVPB        3 g 200 mL/hr over 30 Minutes Intravenous Daily at bedtime 01/16/21 1058     01/17/21 1000  remdesivir 100 mg in sodium chloride 0.9 % 100 mL IVPB       "Followed by" Linked Group Details   100 mg 200 mL/hr over 30 Minutes Intravenous Daily 01/15/21 2357 01/19/21 0959   01/16/21 1145  Ampicillin-Sulbactam (UNASYN) 3 g in sodium chloride 0.9 % 100 mL IVPB        3 g 200 mL/hr  over 30 Minutes Intravenous  Once 01/16/21 1058 01/16/21 1417   01/16/21 1100  vancomycin (VANCOREADY) IVPB 1500 mg/300 mL        1,500 mg 150 mL/hr over 120 Minutes Intravenous  Once 01/16/21 1017 01/16/21 1849   01/16/21 0300  remdesivir 200 mg in sodium chloride 0.9% 250 mL IVPB       "Followed by" Linked Group Details   200 mg 580 mL/hr over 30 Minutes Intravenous Once 01/15/21 2357 01/16/21 0543   01/15/21 2345  amoxicillin-clavulanate (AUGMENTIN) 875-125 MG per tablet 1 tablet  Status:  Discontinued        1 tablet Oral Every 12 hours 01/15/21 2334 01/15/21 2357   01/15/21 1800  vancomycin (VANCOCIN) IVPB 1000 mg/200 mL premix        1,000 mg 200 mL/hr over 60 Minutes Intravenous  Once 01/15/21 1753 01/15/21 2120       Time Spent in minutes  30   Lala Lund M.D on 01/17/2021 at 11:30 AM  To page go to [email or web address]   [facility] -  Office  [phone]    See all Orders from today for further details    Objective:   Vitals:   01/16/21 2131 01/16/21 2222 01/17/21 0642 01/17/21 0900  BP: 101/70 122/81 115/76 122/66  Pulse: 77     Resp: 14  16   Temp: 98.3 F (36.8 C)  (!) 97.5 F (36.4 C)   TempSrc: Oral  Oral   SpO2: 100%  100%   Weight:      Height:        Wt Readings from Last 3 Encounters:  01/16/21 107.7 kg  10/23/20 109.3 kg  08/18/20 103.4 kg     Intake/Output Summary (Last 24 hours) at 01/17/2021 1130 Last data filed at 01/17/2021 0956 Gross per 24 hour  Intake 63 ml  Output --  Net 63 ml     Physical Exam  Awake Alert, No new F.N deficits, Normal affect  [place].AT,PERRAL Supple Neck,No JVD, No cervical lymphadenopathy appriciated.  Symmetrical Chest wall movement, Good air movement bilaterally, CTAB RRR,No Gallops, Rubs or new Murmurs, No Parasternal Heave +ve B.Sounds, Abd Soft, No tenderness, No organomegaly appriciated, No rebound - guarding or rigidity. L. Breast under bandage, was examined with surgery PA on 01/16/21 - see CCS  note & picture    Data Review:    CBC Recent Labs  Lab 01/15/21 1855 01/16/21 0821 01/17/21 0204  WBC 10.9* 8.2 6.6  HGB 9.6* 9.2* 8.4*  HCT 30.0* 29.5* 27.5*  PLT 347 331 315  MCV 97.4 99.0 100.0  MCH 31.2 30.9 30.5  MCHC 32.0 31.2 30.5  RDW 16.3* 16.4* 16.4*  LYMPHSABS 0.8  --  1.0  MONOABS 0.9  --  0.4  EOSABS 0.1  --  0.3  BASOSABS 0.0  --  0.0    Recent Labs  Lab 01/15/21 1855 01/16/21 0512 01/17/21 0204 01/17/21 0205  NA 140 141 138  --   K 3.1* 3.8 3.7  --   CL 93* 94* 94*  --   CO2 34* 32 29  --   GLUCOSE 83 90 134*  --   BUN 15 20 29*  --   CREATININE 6.02* 7.07* 8.84*  --   CALCIUM 9.1 9.0 8.9  --   AST  --  15 11*  --   ALT  --  12 6  --   ALKPHOS  --  41 35*  --   BILITOT  --  0.7 0.7  --   ALBUMIN  --  2.5* 2.2*  --   MG  --  2.0 2.0  --   CRP 17.8* 17.7* 14.1*  --   DDIMER  --  2.11* 2.24*  --   PROCALCITON  --  1.62  --   --   BNP  --  707.3*  --  1,072.2*    ------------------------------------------------------------------------------------------------------------------ No results for input(s): CHOL, HDL, LDLCALC, TRIG, CHOLHDL, LDLDIRECT in the last 72 hours.  Lab Results  Component Value Date   HGBA1C 5.2 01/16/2019   ------------------------------------------------------------------------------------------------------------------ No results for input(s): TSH, T4TOTAL, T3FREE, THYROIDAB in the last 72 hours.  Invalid input(s): FREET3  Cardiac Enzymes No results for input(s): CKMB, TROPONINI, MYOGLOBIN in the last 168 hours.  Invalid input(s): CK ------------------------------------------------------------------------------------------------------------------    Component Value Date/Time   BNP 1,072.2 (H) 01/17/2021 0205    Micro Results Recent Results (from the past 240 hour(s))  SARS Coronavirus 2 by RT PCR (hospital order, performed in [facility] lab) Nasopharyngeal Nasopharyngeal Swab     Status: Abnormal    Collection Time: 01/15/21  5:53 PM   Specimen: Nasopharyngeal Swab  Result Value Ref Range Status   SARS Coronavirus 2 POSITIVE (A) NEGATIVE Final    Comment: RESULT CALLED TO, READ BACK BY AND VERIFIED WITH: Eustaquio Boyden RN 01/15/21 2209 JDW (NOTE) SARS-CoV-2 target nucleic acids are DETECTED  SARS-CoV-2 RNA is generally detectable in upper respiratory specimens  during the acute phase of infection.  Positive results are indicative  of the presence of the identified virus, but do not rule out bacterial infection or co-infection with other pathogens not detected by the test.  Clinical correlation with patient history and  other diagnostic information is necessary to determine patient infection status.  The expected result is negative.  Fact Sheet for Patients:   StrictlyIdeas.no   Fact Sheet for Healthcare Providers:   BankingDealers.co.za    This test is not yet approved  or cleared by the [place] and  has been authorized for detection and/or diagnosis of SARS-CoV-2 by FDA under an Emergency Use Authorization (EUA).  This EUA will remain in effect (meaning this  test can be used) for the duration of  the COVID-19 declaration under Section 564(b)(1) of the Act, 21 U.S.C. section 360-bbb-3(b)(1), unless the authorization is terminated or revoked sooner.  Performed at [facility], [place] [address], [place], [place] [address]   SARS CORONAVIRUS 2 (TAT 6-24 HRS) Nasopharyngeal Nasopharyngeal Swab     Status: None   Collection Time: 01/15/21  5:55 PM   Specimen: Nasopharyngeal Swab  Result Value Ref Range Status   SARS Coronavirus 2 NEGATIVE NEGATIVE Final    Comment: (NOTE) SARS-CoV-2 target nucleic acids are NOT DETECTED.  The SARS-CoV-2 RNA is generally detectable in upper and lower respiratory specimens during the acute phase of infection. Negative results do not preclude SARS-CoV-2 infection, do not rule  out co-infections with other pathogens, and should not be used as the sole basis for treatment or other patient management decisions. Negative results must be combined with clinical observations, patient history, and epidemiological information. The expected result is Negative.  Fact Sheet for Patients: SugarRoll.be  Fact Sheet for Healthcare Providers: https://www.woods-mathews.com/  This test is not yet approved or cleared by the [place] FDA and  has been authorized for detection and/or diagnosis of SARS-CoV-2 by FDA under an Emergency Use Authorization (EUA). This EUA will remain  in effect (meaning this test can be used) for the duration of the COVID-19 declaration under Se ction 564(b)(1) of the Act, 21 U.S.C. section 360bbb-3(b)(1), unless the authorization is terminated or revoked sooner.  Performed at [facility], [place] [address] [place], [place] [address]   Aerobic/Anaerobic Culture (surgical/deep wound)     Status: None (Preliminary result)   Collection Time: 01/16/21 12:09 AM   Specimen: Abscess  Result Value Ref Range Status   Specimen Description ABSCESS LEFT BREAST  Final   Special Requests NONE  Final   Gram Stain NO WBC SEEN FEW GRAM POSITIVE COCCI   Final   Culture   Final    NO GROWTH 1 DAY Performed at [facility], [place] [address], [place], [place] [address]    Report Status PENDING  Incomplete    Radiology Reports CT Chest W Contrast  Result Date: 01/05/2021 CLINICAL DATA:  Soft tissue mass, breast mass versus infection EXAM: CT CHEST WITH CONTRAST TECHNIQUE: Multidetector CT imaging of the chest was performed during intravenous contrast administration. CONTRAST:  114m OMNIPAQUE IOHEXOL 300 MG/ML  SOLN COMPARISON:  None. FINDINGS: Cardiovascular: Aortic atherosclerosis. Normal heart size. Three-vessel coronary artery calcifications and/or stents. No pericardial effusion. The central superior  vena cava is narrowed and effaced with intraluminal calcifications and extensive superficial vascular collateralization about the chest wall (series 3, image 43, series 6, image 75). Mediastinum/Nodes: No enlarged mediastinal or hilar lymph nodes. Enlarged left axillary and subpectoral lymph nodes measuring up to 2.0 x 1.2 cm (series 3, image 25). Thyroid gland, trachea, and esophagus demonstrate no significant findings. Lungs/Pleura: Lungs are clear. No pleural effusion or pneumothorax. Upper Abdomen: No acute abnormality. Atrophic appearance of the partially included kidneys in the upper abdomen. Musculoskeletal: There is extensive subcutaneous fat stranding and skin thickening of the left breast. There is a internally hypoattenuating mass or fluid collection in the subareolar left breast measuring 7.3 x 7.0 x 4.8 cm (series 3, image 69, series 6, image  37). IMPRESSION: 1. There is extensive subcutaneous fat stranding and skin thickening of the left breast. There is a internally hypoattenuating mass or fluid collection in the subareolar left breast measuring 7.3 x 7.0 x 4.8 cm. Enlarged left axillary and subpectoral lymph nodes. This may reflect a benign breast abscess depending upon clinical presentation, but locally advanced malignancy with involvement of the skin and lymph nodes would be difficult to confidently distinguish based on CT. Recommend initial targeted ultrasound to assess for drainable fluid collection. 2. The central superior vena cava is narrowed and effaced with intraluminal calcifications and extensive superficial vascular collateralization about the chest wall, generally suggestive chronic central venous stenosis secondary to prior indwelling catheter access. 3. Coronary artery disease and aortic atherosclerosis, markedly advanced for patient age. Aortic Atherosclerosis (ICD10-I70.0). Electronically Signed   By: Eddie Candle M.D.   On: 01/05/2021 14:00   Portable chest 1 View  Result Date:  01/16/2021 CLINICAL DATA:  COVID-19. EXAM: PORTABLE CHEST 1 VIEW COMPARISON:  Chest CT 01/05/2021 FINDINGS: Cardiac contours upper limits of normal. No large area pulmonary consolidation. Minimal left basilar atelectasis. No pleural effusion or pneumothorax. Osseous structures unremarkable. IMPRESSION: No acute cardiopulmonary process. Electronically Signed   By: Lovey Newcomer M.D.   On: 01/16/2021 08:13   US BREAST COMPLETE UNI LEFT INC AXILLA  Result Date: 01/05/2021 CLINICAL DATA:  Follow-up abnormal CT chest. EXAM: ULTRASOUND OF THE LEFT BREAST COMPARISON:  CT 01/05/2021. FINDINGS: Left breast ultrasound reveals a 6.2 x 4.5 x 7.2 cm complex cystic mass. Although tumor cannot be excluded, abscess and or hematoma would be more likely. Close follow-up exam suggested to demonstrate complete resolution in order to exclude a tumor. IMPRESSION: Left breast soft tissue edema. Left breast complex cystic mass. Although tumor cannot be excluded, abscess and or hematoma would be more likely. Aspiration of the left breast cystic mass should be considered. RECOMMENDATION: Aspiration of the left breast cystic mass should be considered. Close follow-up exams suggested to demonstrate complete resolution in order to exclude a tumor. I BI-RADS CATEGORY  BI-RADS category 4. Electronically Signed   By: Marcello Moores  Register   On: 01/05/2021 15:57

## 2021-01-18 ENCOUNTER — Encounter (HOSPITAL_COMMUNITY): Payer: Self-pay | Admitting: Surgery

## 2021-01-18 DIAGNOSIS — U071 COVID-19: Secondary | ICD-10-CM | POA: Diagnosis not present

## 2021-01-18 DIAGNOSIS — N611 Abscess of the breast and nipple: Secondary | ICD-10-CM | POA: Diagnosis not present

## 2021-01-18 LAB — CBC WITH DIFFERENTIAL/PLATELET
Abs Immature Granulocytes: 0.04 10*3/uL (ref 0.00–0.07)
Basophils Absolute: 0 10*3/uL (ref 0.0–0.1)
Basophils Relative: 1 %
Eosinophils Absolute: 0.3 10*3/uL (ref 0.0–0.5)
Eosinophils Relative: 4 %
HCT: 27.6 % — ABNORMAL LOW (ref 36.0–46.0)
Hemoglobin: 8.2 g/dL — ABNORMAL LOW (ref 12.0–15.0)
Immature Granulocytes: 1 %
Lymphocytes Relative: 14 %
Lymphs Abs: 0.9 10*3/uL (ref 0.7–4.0)
MCH: 29.5 pg (ref 26.0–34.0)
MCHC: 29.7 g/dL — ABNORMAL LOW (ref 30.0–36.0)
MCV: 99.3 fL (ref 80.0–100.0)
Monocytes Absolute: 0.4 10*3/uL (ref 0.1–1.0)
Monocytes Relative: 7 %
Neutro Abs: 4.7 10*3/uL (ref 1.7–7.7)
Neutrophils Relative %: 73 %
Platelets: 290 10*3/uL (ref 150–400)
RBC: 2.78 MIL/uL — ABNORMAL LOW (ref 3.87–5.11)
RDW: 16 % — ABNORMAL HIGH (ref 11.5–15.5)
WBC: 6.4 10*3/uL (ref 4.0–10.5)
nRBC: 0 % (ref 0.0–0.2)

## 2021-01-18 LAB — COMPREHENSIVE METABOLIC PANEL
ALT: 7 U/L (ref 0–44)
AST: 11 U/L — ABNORMAL LOW (ref 15–41)
Albumin: 2.2 g/dL — ABNORMAL LOW (ref 3.5–5.0)
Alkaline Phosphatase: 35 U/L — ABNORMAL LOW (ref 38–126)
Anion gap: 15 (ref 5–15)
BUN: 40 mg/dL — ABNORMAL HIGH (ref 6–20)
CO2: 30 mmol/L (ref 22–32)
Calcium: 9.1 mg/dL (ref 8.9–10.3)
Chloride: 92 mmol/L — ABNORMAL LOW (ref 98–111)
Creatinine, Ser: 10.94 mg/dL — ABNORMAL HIGH (ref 0.44–1.00)
GFR, Estimated: 4 mL/min — ABNORMAL LOW (ref >60)
Glucose, Bld: 107 mg/dL — ABNORMAL HIGH (ref 70–99)
Potassium: 3.9 mmol/L (ref 3.5–5.1)
Sodium: 137 mmol/L (ref 135–145)
Total Bilirubin: 0.5 mg/dL (ref 0.3–1.2)
Total Protein: 6 g/dL — ABNORMAL LOW (ref 6.5–8.1)

## 2021-01-18 LAB — MAGNESIUM: Magnesium: 2 mg/dL (ref 1.7–2.4)

## 2021-01-18 LAB — PROCALCITONIN: Procalcitonin: 1.22 ng/mL

## 2021-01-18 LAB — C-REACTIVE PROTEIN: CRP: 10.1 mg/dL — ABNORMAL HIGH (ref <1.0)

## 2021-01-18 LAB — BRAIN NATRIURETIC PEPTIDE: B Natriuretic Peptide: 1410.7 pg/mL — ABNORMAL HIGH (ref 0.0–100.0)

## 2021-01-18 LAB — SARS CORONAVIRUS 2 (TAT 6-24 HRS): SARS Coronavirus 2: NEGATIVE

## 2021-01-18 LAB — D-DIMER, QUANTITATIVE: D-Dimer, Quant: 2.5 ug/mL-FEU — ABNORMAL HIGH (ref 0.00–0.50)

## 2021-01-18 MED ORDER — VANCOMYCIN HCL IN DEXTROSE 1-5 GM/200ML-% IV SOLN
INTRAVENOUS | Status: AC
  Administered 2021-01-18: 1000 mg via INTRAVENOUS
  Filled 2021-01-18: qty 200

## 2021-01-18 MED ORDER — ACETAMINOPHEN 325 MG PO TABS
ORAL_TABLET | ORAL | Status: AC
  Filled 2021-01-18: qty 2

## 2021-01-18 NOTE — Progress Notes (Signed)
[facility] Progress Note  3 Days Post-Op  Subjective: CC-  Tolerating dressing changes well. WBC 6.4, afebrile States that she developed some nasal congestion over night. Denies SOB or CP.  Objective: Vital signs in last 24 hours: Temp:  [98 F (36.7 C)-98.4 F (36.9 C)] 98 F (36.7 C) (01/24 0352) Pulse Rate:  [71-75] 75 (01/23 2101) Resp:  [16-20] 16 (01/24 0352) BP: (121-127)/(68-81) 127/81 (01/24 0352) SpO2:  [98 %-100 %] 98 % (01/24 0352) Weight:  [102.3 kg] 102.3 kg (01/24 0352) Last BM Date: 01/15/21  Intake/Output from previous day: 01/23 0701 - 01/24 0700 In: 863 [P.O.:760; I.V.:3; IV Piggyback:100] Out: -  Intake/Output this shift: No intake/output data recorded.  PE: Gen: Alert, NAD, pleasant Pulm: rate and effort normal Abd: Soft, NT/ND Skin: warm and dry Left breast: wound ~4x6cm and very deep with some surrounding cellulitis/induration >> some persistent induration from 9 to 12 o'clock positions but this is improved from yesterday, no purulent drainage and no active bleeding   Lab Results:  Recent Labs    01/17/21 0204 01/18/21 0136  WBC 6.6 6.4  HGB 8.4* 8.2*  HCT 27.5* 27.6*  PLT 315 290   BMET Recent Labs    01/17/21 0204 01/18/21 0136  NA 138 137  K 3.7 3.9  CL 94* 92*  CO2 29 30  GLUCOSE 134* 107*  BUN 29* 40*  CREATININE 8.84* 10.94*  CALCIUM 8.9 9.1   PT/INR No results for input(s): LABPROT, INR in the last 72 hours. CMP     Component Value Date/Time   NA 137 01/18/2021 0136   K 3.9 01/18/2021 0136   CL 92 (L) 01/18/2021 0136   CO2 30 01/18/2021 0136   GLUCOSE 107 (H) 01/18/2021 0136   BUN 40 (H) 01/18/2021 0136   CREATININE 10.94 (H) 01/18/2021 0136   CALCIUM 9.1 01/18/2021 0136   PROT 6.0 (L) 01/18/2021 0136   ALBUMIN 2.2 (L) 01/18/2021 0136   AST 11 (L) 01/18/2021 0136   ALT 7 01/18/2021 0136   ALKPHOS 35 (L) 01/18/2021 0136   BILITOT 0.5 01/18/2021 0136   GFRNONAA 4 (L) 01/18/2021 0136   GFRAA 5  (L) 02/13/2019 0734   Lipase     Component Value Date/Time   LIPASE 33 07/17/2018 1145       Studies/Results: No results found.  Anti-infectives: Anti-infectives (From admission, onward)   Start     Dose/Rate Route Frequency Ordered Stop   01/18/21 1200  vancomycin (VANCOCIN) IVPB 1000 mg/200 mL premix        1,000 mg 200 mL/hr over 60 Minutes Intravenous Every M-W-F (Hemodialysis) 01/16/21 1017     01/17/21 2200  Ampicillin-Sulbactam (UNASYN) 3 g in sodium chloride 0.9 % 100 mL IVPB        3 g 200 mL/hr over 30 Minutes Intravenous Daily at bedtime 01/16/21 1058     01/17/21 1000  remdesivir 100 mg in sodium chloride 0.9 % 100 mL IVPB       "Followed by" Linked Group Details   100 mg 200 mL/hr over 30 Minutes Intravenous Daily 01/15/21 2357 01/18/21 0918   01/16/21 1145  Ampicillin-Sulbactam (UNASYN) 3 g in sodium chloride 0.9 % 100 mL IVPB        3 g 200 mL/hr over 30 Minutes Intravenous  Once 01/16/21 1058 01/16/21 1417   01/16/21 1100  vancomycin (VANCOREADY) IVPB 1500 mg/300 mL        1,500 mg 150 mL/hr over 120 Minutes Intravenous  Once 01/16/21 1017 01/16/21 1849   01/16/21 0300  remdesivir 200 mg in sodium chloride 0.9% 250 mL IVPB       "Followed by" Linked Group Details   200 mg 580 mL/hr over 30 Minutes Intravenous Once 01/15/21 2357 01/16/21 0543   01/15/21 2345  amoxicillin-clavulanate (AUGMENTIN) 875-125 MG per tablet 1 tablet  Status:  Discontinued        1 tablet Oral Every 12 hours 01/15/21 2334 01/15/21 2357   01/15/21 1800  vancomycin (VANCOCIN) IVPB 1000 mg/200 mL premix        1,000 mg 200 mL/hr over 60 Minutes Intravenous  Once 01/15/21 1753 01/15/21 2120       Assessment/Plan ESRD PAD Blind Chronic anemia Obesity BMI 40.76 COVID+  Left breast abscess S/pIncision and drainage of left breast abscess with excisional debridement1/21 Dr. Zenia Resides -POD#3 - concern for underlying malignancy, Path and Cx pending - Continue BID wet to dry  dressing changes. Ok to shower with wound open. Continue IV antibiotics and follow cx and path. Home health orders have been placed, but patient unsure where she will go upon discharge. [facility] for discharge once dispo arranged.  ID -vancomycin 1/21>>, unasyn 1/22>> FEN -renal diet VTE -SCDs, sq heparin   LOS: 3 days    Wellington Hampshire, [facility] 01/18/2021, 10:04 AM Please see Amion for pager number during day hours 7:00am-4:30pm

## 2021-01-18 NOTE — Progress Notes (Signed)
[facility] Progress Note  Name: Rebekah Burton MRN: [id number] DOB: [birth year]   Subjective:  Patient not examined today directly given COVID-19 + status, utilizing data taken from chart +/- discussions w/ providers and staff.    Last HD 1/21 as OP.   -------- Background on consult:  Rebekah Burton is a 45 y.o. female with a history of end-stage renal disease on hemodialysis Monday Wednesday Friday, hypertension blindness, and peripheral artery disease who came in for left breast abscess I&D.  She was found to have COVID.  She did get HD on 1/21, Friday.  Feels well and states that HD has been going well.  She's not on oxygen at home and has been 2.5 liters here.    Intake/Output Summary (Last 24 hours) at 01/18/2021 1704 Last data filed at 01/18/2021 1330 Gross per 24 hour  Intake 620 ml  Output --  Net 620 ml    Vitals:  Vitals:   01/18/21 1446 01/18/21 1547 01/18/21 1552 01/18/21 1600  BP: 123/71 (!) (P) 164/98 (P) 140/80 (P) 124/76  Pulse: 73     Resp: 17     Temp: 97.7 F (36.5 C)     TempSrc: Oral (P) Oral    SpO2: 100% (P) 99%    Weight:      Height:         Physical Exam:   Patient not examined today directly given COVID-19 + status, utilizing data taken from chart +/- discussions w/ providers and staff.    Medications reviewed   Labs:  BMP Latest Ref Rng & Units 01/18/2021 01/17/2021 01/16/2021  Glucose 70 - 99 mg/dL 107(H) 134(H) 90  BUN 6 - 20 mg/dL 40(H) 29(H) 20  Creatinine 0.44 - 1.00 mg/dL 10.94(H) 8.84(H) 7.07(H)  Sodium 135 - 145 mmol/L 137 138 141  Potassium 3.5 - 5.1 mmol/L 3.9 3.7 3.8  Chloride 98 - 111 mmol/L 92(L) 94(L) 94(L)  CO2 22 - 32 mmol/L 30 29 32  Calcium 8.9 - 10.3 mg/dL 9.1 8.9 9.0    OP HD: MWF    4h 59mn  2/2.5 bath  105kg  AVF  Heparin ? parsabiv 15 mg three times a week with HD mircera 150 mcg every 2 weeks - last given 01/13/21 hectorol 3 mcg three times a week    Assessment/Plan:   # End-stage renal  disease - HD per MWF schedule. HD today on schedule.   # Left breast abscess - Antibiotics per primary team  # covid 19 infection  - Therapies per primary team  # Acute hypoxic resp failure  - supportive care including oxygen  - optimize volume status as able with HD  - covid management as above  # HTN  - acceptable control   # Anemia CKD - ESA just given as above  # Metabolic bone disease - check phos - continue hectorol  - not able to get parsabiv here  Disposition per primary team.  She will need to be on the covid shift at her home unit ([facility] on discharge while in isolation window.  Per HD SW there is not a place for the patient to live as she had been staying with a friend who is moving out of state soon and who will not allow her to return to her current home while testing positive for covid   RSol Blazing MD 01/18/2021 5:04 PM

## 2021-01-18 NOTE — Discharge Instructions (Addendum)
Shower daily and allow soap and water to rinse over breast wounds. Apply antibiotic ointment and dry dressing as needed to inferior breast wound.   It is very important that you get a mammogram at the breast center    Mammogram A mammogram is an X-ray of the breasts. This is done to check for changes that are not normal. This test can look for changes that may be caused by breast cancer or other problems. Mammograms are regularly done on women beginning at age 43. A man may have a mammogram if he has a lump or swelling in his breast. Tell a doctor:  About any allergies you have.  If you have breast implants.  If you have had breast disease, biopsy, or surgery.  If you have a family history of breast cancer.  If you are breastfeeding.  Whether you are pregnant or may be pregnant. What are the risks? Generally, this is a safe procedure. But problems may occur, including:  Being exposed to radiation. Radiation levels are very low with this test.  The need for more tests.  The results were not read properly.  Trouble finding breast cancer in women with dense breasts. What happens before the test?  Have this test done about 1-2 weeks after your menstrual period. This is often when your breasts are the least tender.  If you are visiting a new doctor or clinic, have any past mammogram images sent to your new doctor's office.  Wash your breasts and under your arms on the day of the test.  Do not use deodorants, perfumes, lotions, or powders on the day of the test.  Take off any jewelry from your neck.  Wear clothes that you can change into and out of easily. What happens during the test?  You will take off your clothes from the waist up. You will put on a gown.  You will stand in front of the X-ray machine.  Each breast will be placed between two plastic or glass plates. The plates will press down on your breast for a few seconds. Try to relax. This does not cause any harm  to your breasts. It may not feel comfortable, but it will be very brief.  X-rays will be taken from different angles of each breast. The procedure may vary among doctors and hospitals.   What can I expect after the test?  The mammogram will be read by a specialist (radiologist).  You may need to do parts of the test again. This depends on the quality of the images.  You may go back to your normal activities.  It is up to you to get the results of your test. Ask how to get your results when they are ready. Summary  A mammogram is an X-ray of the breasts. It looks for changes that may be caused by breast cancer or other problems.  A man may have this test if he has a lump or swelling in his breast.  Before the test, tell your doctor about any breast problems that you have had in the past.  Have this test done about 1-2 weeks after your menstrual period.  Ask when your test results will be ready. Make sure you get your test results. This information is not intended to replace advice given to you by your health care provider. Make sure you discuss any questions you have with your health care provider. Document Revised: 10/12/2020 Document Reviewed: 10/12/2020 Elsevier Patient Education  2021 [employer].

## 2021-01-18 NOTE — Progress Notes (Signed)
PROGRESS NOTE                                                                                                                                                                                                             Patient Demographics:    Rebekah Burton, is a 45 y.o. female, DOB - 1976/12/04, PB:[id number]  Outpatient Primary MD for the patient is Trey Sailors, [place]    LOS - 3  Admit date - 01/15/2021    Chief Complaint  Patient presents with  . Abscess       Brief Narrative (HPI from H&P)   Rebekah Burton is a 45 y.o. female with medical history significant of ESRD-HD M-W-F, blindness, HTN, PAD had developed a breast abscess and was seen in ED 01/04/21 where U/S confirmed abscess, she was admitted for left breast abscess incision and drainage and was found to have incidental COVID infection.   Subjective:   Patient in bed, appears comfortable, denies any headache, no fever, no chest pain or pressure, no shortness of breath , no abdominal pain. No focal weakness.   Assessment  & Plan :     1. Acute Covid 19 Infection  - she has no evidence of pulmonary involvement or COVID-specific symptoms, monitor.  CRP is elevated due to left breast abscess.  She had one positive and one negative test in the last few days, will check a third to confirm.  Encouraged the patient to sit up in chair in the daytime use I-S and flutter valve for pulmonary toiletry and then prone in bed when at night.  Will advance activity and titrate down oxygen as possible.   Recent Labs  Lab 01/15/21 1753 01/15/21 1755 01/15/21 1855 01/16/21 0512 01/16/21 0821 01/17/21 0204 01/17/21 0205 01/17/21 1145 01/18/21 0136  WBC  --   --  10.9*  --  8.2 6.6  --   --  6.4  HGB  --   --  9.6*  --  9.2* 8.4*  --   --  8.2*  HCT  --   --  30.0*  --  29.5* 27.5*  --   --  27.6*  PLT  --   --  347  --  331 315  --   --  290  CRP  --   --  17.8* 17.7*   --  14.1*  --   --  10.1*  BNP  --   --   --  707.3*  --   --  1,072.2*  --  1,410.7*  DDIMER  --   --   --  2.11*  --  2.24*  --   --  2.50*  PROCALCITON  --   --   --  1.62  --   --   --  1.46 1.22  AST  --   --   --  15  --  11*  --   --  11*  ALT  --   --   --  12  --  6  --   --  7  ALKPHOS  --   --   --  41  --  35*  --   --  35*  BILITOT  --   --   --  0.7  --  0.7  --   --  0.5  ALBUMIN  --   --   --  2.5*  --  2.2*  --   --  2.2*  SARSCOV2NAA POSITIVE* NEGATIVE  --   --   --   --   --   --   --     2. Left breast abscess.  I&D done by general surgery, on Vancomycin, Unasyn added. Follow cultures, prelim growing gram-positive cocci.   3. ESRD.  MWF schedule, Renal following.  4. AOCD - monitor.  5.HTN - on home meds.          Condition - Fair  Family Communication  Joanell Rising on [phone] 01/18/2021 at 9:17 AM  Code Status :  Full  Consults  :  CCS, Renal  Procedures  :    CT - : 1. There is extensive subcutaneous fat stranding and skin thickening of the left breast. There is a internally hypoattenuating mass or fluid collection in the subareolar left breast measuring 7.3 x 7.0 x 4.8 cm. Enlarged left axillary and subpectoral lymph nodes. This may reflect a benign breast abscess depending upon clinical presentation, but locally advanced malignancy with involvement of the skin and lymph nodes would be difficult to confidently distinguish based on CT. Recommend initial targeted ultrasound to assess for drainable fluid collection. 2. The central superior vena cava is narrowed and effaced with intraluminal calcifications and extensive superficial vascular collateralization about the chest wall, generally suggestive chronic central venous stenosis secondary to prior indwelling catheter access. 3. Coronary artery disease and aortic atherosclerosis, markedly advanced for patient age. Aortic Atherosclerosis  L.Breast I&D by CCS on 01/15/21   PUD Prophylaxis :  None  Disposition Plan  :    Status is: Inpatient  Remains inpatient appropriate because:IV treatments appropriate due to intensity of illness or inability to take PO   Dispo: The patient is from: Home              Anticipated d/c is to: Home              Anticipated d/c date is: > 3 days              Patient currently is not medically stable to d/c.   Difficult to place patient No  DVT Prophylaxis  :   Heparin   Lab Results  Component Value Date   PLT 290 01/18/2021    Diet :  Diet Order            Diet renal with fluid restriction  Fluid restriction: 1200 mL Fluid; Room service appropriate? Yes; Fluid consistency: Thin  Diet effective now                  Inpatient Medications  Scheduled Meds: . carvedilol  25 mg Oral BID WC  . Chlorhexidine Gluconate Cloth  6 each Topical Q0600  . cloNIDine  0.3 mg Oral TID  . heparin  5,000 Units Subcutaneous Q8H  . lidocaine-prilocaine  1 application Topical Q M,W,F-HD  . multivitamin  1 tablet Oral QHS  . senna  1 tablet Oral BID  . sevelamer carbonate  3,200 mg Oral TID WC  . sodium chloride flush  3 mL Intravenous Q12H   Continuous Infusions: . sodium chloride    . ampicillin-sulbactam (UNASYN) IV 3 g (01/17/21 2154)  . remdesivir 100 mg in NS 100 mL 100 mg (01/18/21 0848)  . vancomycin     PRN Meds:.sodium chloride, acetaminophen **OR** [DISCONTINUED] acetaminophen, HYDROcodone-acetaminophen, morphine injection, sodium chloride flush  Antibiotics  :    Anti-infectives (From admission, onward)   Start     Dose/Rate Route Frequency Ordered Stop   01/18/21 1200  vancomycin (VANCOCIN) IVPB 1000 mg/200 mL premix        1,000 mg 200 mL/hr over 60 Minutes Intravenous Every M-W-F (Hemodialysis) 01/16/21 1017     01/17/21 2200  Ampicillin-Sulbactam (UNASYN) 3 g in sodium chloride 0.9 % 100 mL IVPB        3 g 200 mL/hr over 30 Minutes Intravenous Daily at bedtime 01/16/21 1058     01/17/21 1000  remdesivir 100 mg in sodium  chloride 0.9 % 100 mL IVPB       "Followed by" Linked Group Details   100 mg 200 mL/hr over 30 Minutes Intravenous Daily 01/15/21 2357 01/19/21 0959   01/16/21 1145  Ampicillin-Sulbactam (UNASYN) 3 g in sodium chloride 0.9 % 100 mL IVPB        3 g 200 mL/hr over 30 Minutes Intravenous  Once 01/16/21 1058 01/16/21 1417   01/16/21 1100  vancomycin (VANCOREADY) IVPB 1500 mg/300 mL        1,500 mg 150 mL/hr over 120 Minutes Intravenous  Once 01/16/21 1017 01/16/21 1849   01/16/21 0300  remdesivir 200 mg in sodium chloride 0.9% 250 mL IVPB       "Followed by" Linked Group Details   200 mg 580 mL/hr over 30 Minutes Intravenous Once 01/15/21 2357 01/16/21 0543   01/15/21 2345  amoxicillin-clavulanate (AUGMENTIN) 875-125 MG per tablet 1 tablet  Status:  Discontinued        1 tablet Oral Every 12 hours 01/15/21 2334 01/15/21 2357   01/15/21 1800  vancomycin (VANCOCIN) IVPB 1000 mg/200 mL premix        1,000 mg 200 mL/hr over 60 Minutes Intravenous  Once 01/15/21 1753 01/15/21 2120       Time Spent in minutes  30   Lala Lund M.D on 01/18/2021 at 9:14 AM  To page go to [email or web address]   [facility] -  Office  [phone]    See all Orders from today for further details    Objective:   Vitals:   01/17/21 1405 01/17/21 1806 01/17/21 2101 01/18/21 0352  BP: 124/68  121/71 127/81  Pulse: 71  75   Resp: '17 20 16 16  '$ Temp: 98.1 F (36.7 C)  98.4 F (36.9 C) 98 F (36.7 C)  TempSrc: Oral  Oral Oral  SpO2: 100%  99% 98%  Weight:  102.3 kg  Height:        Wt Readings from Last 3 Encounters:  01/18/21 102.3 kg  10/23/20 109.3 kg  08/18/20 103.4 kg     Intake/Output Summary (Last 24 hours) at 01/18/2021 0914 Last data filed at 01/18/2021 0600 Gross per 24 hour  Intake 863 ml  Output -  Net 863 ml     Physical Exam  Awake Alert, No new F.N deficits, Normal affect [place].AT,PERRAL Supple Neck,No JVD, No cervical lymphadenopathy appriciated.  Symmetrical Chest  wall movement, Good air movement bilaterally, CTAB RRR,No Gallops, Rubs or new Murmurs, No Parasternal Heave +ve B.Sounds, Abd Soft, No tenderness, No organomegaly appriciated, No rebound - guarding or rigidity. No Cyanosis, Clubbing or edema, No new Rash or bruise  L. Breast under bandage, was examined with surgery PA on 01/16/21 - see CCS note & picture    Data Review:    CBC Recent Labs  Lab 01/15/21 1855 01/16/21 0821 01/17/21 0204 01/18/21 0136  WBC 10.9* 8.2 6.6 6.4  HGB 9.6* 9.2* 8.4* 8.2*  HCT 30.0* 29.5* 27.5* 27.6*  PLT 347 331 315 290  MCV 97.4 99.0 100.0 99.3  MCH 31.2 30.9 30.5 29.5  MCHC 32.0 31.2 30.5 29.7*  RDW 16.3* 16.4* 16.4* 16.0*  LYMPHSABS 0.8  --  1.0 0.9  MONOABS 0.9  --  0.4 0.4  EOSABS 0.1  --  0.3 0.3  BASOSABS 0.0  --  0.0 0.0    Recent Labs  Lab 01/15/21 1855 01/16/21 0512 01/17/21 0204 01/17/21 0205 01/17/21 1145 01/18/21 0136  NA 140 141 138  --   --  137  K 3.1* 3.8 3.7  --   --  3.9  CL 93* 94* 94*  --   --  92*  CO2 34* 32 29  --   --  30  GLUCOSE 83 90 134*  --   --  107*  BUN 15 20 29*  --   --  40*  CREATININE 6.02* 7.07* 8.84*  --   --  10.94*  CALCIUM 9.1 9.0 8.9  --   --  9.1  AST  --  15 11*  --   --  11*  ALT  --  12 6  --   --  7  ALKPHOS  --  41 35*  --   --  35*  BILITOT  --  0.7 0.7  --   --  0.5  ALBUMIN  --  2.5* 2.2*  --   --  2.2*  MG  --  2.0 2.0  --   --  2.0  CRP 17.8* 17.7* 14.1*  --   --  10.1*  DDIMER  --  2.11* 2.24*  --   --  2.50*  PROCALCITON  --  1.62  --   --  1.46 1.22  BNP  --  707.3*  --  1,072.2*  --  1,410.7*    ------------------------------------------------------------------------------------------------------------------ No results for input(s): CHOL, HDL, LDLCALC, TRIG, CHOLHDL, LDLDIRECT in the last 72 hours.  Lab Results  Component Value Date   HGBA1C 5.2 01/16/2019    ------------------------------------------------------------------------------------------------------------------ No results for input(s): TSH, T4TOTAL, T3FREE, THYROIDAB in the last 72 hours.  Invalid input(s): FREET3  Cardiac Enzymes No results for input(s): CKMB, TROPONINI, MYOGLOBIN in the last 168 hours.  Invalid input(s): CK ------------------------------------------------------------------------------------------------------------------    Component Value Date/Time   BNP 1,410.7 (H) 01/18/2021 0136    Micro Results Recent Results (from the past 240 hour(s))  SARS Coronavirus 2 by RT PCR (hospital order, performed in [facility] hospital lab) Nasopharyngeal Nasopharyngeal Swab     Status: Abnormal   Collection Time: 01/15/21  5:53 PM   Specimen: Nasopharyngeal Swab  Result Value Ref Range Status   SARS Coronavirus 2 POSITIVE (A) NEGATIVE Final    Comment: RESULT CALLED TO, READ BACK BY AND VERIFIED WITH: Eustaquio Boyden RN 01/15/21 2209 JDW (NOTE) SARS-CoV-2 target nucleic acids are DETECTED  SARS-CoV-2 RNA is generally detectable in upper respiratory specimens  during the acute phase of infection.  Positive results are indicative  of the presence of the identified virus, but do not rule out bacterial infection or co-infection with other pathogens not detected by the test.  Clinical correlation with patient history and  other diagnostic information is necessary to determine patient infection status.  The expected result is negative.  Fact Sheet for Patients:   StrictlyIdeas.no   Fact Sheet for Healthcare Providers:   BankingDealers.co.za    This test is not yet approved or cleared by the [place] FDA and  has been authorized for detection and/or diagnosis of SARS-CoV-2 by FDA under an Emergency Use Authorization (EUA).  This EUA will remain in effect (meaning this  test can be used) for the duration of  the  COVID-19 declaration under Section 564(b)(1) of the Act, 21 U.S.C. section 360-bbb-3(b)(1), unless the authorization is terminated or revoked sooner.  Performed at [facility], [place] [address], [place], [place] [address]   SARS CORONAVIRUS 2 (TAT 6-24 HRS) Nasopharyngeal Nasopharyngeal Swab     Status: None   Collection Time: 01/15/21  5:55 PM   Specimen: Nasopharyngeal Swab  Result Value Ref Range Status   SARS Coronavirus 2 NEGATIVE NEGATIVE Final    Comment: (NOTE) SARS-CoV-2 target nucleic acids are NOT DETECTED.  The SARS-CoV-2 RNA is generally detectable in upper and lower respiratory specimens during the acute phase of infection. Negative results do not preclude SARS-CoV-2 infection, do not rule out co-infections with other pathogens, and should not be used as the sole basis for treatment or other patient management decisions. Negative results must be combined with clinical observations, patient history, and epidemiological information. The expected result is Negative.  Fact Sheet for Patients: SugarRoll.be  Fact Sheet for Healthcare Providers: https://www.woods-mathews.com/  This test is not yet approved or cleared by the [place] FDA and  has been authorized for detection and/or diagnosis of SARS-CoV-2 by FDA under an Emergency Use Authorization (EUA). This EUA will remain  in effect (meaning this test can be used) for the duration of the COVID-19 declaration under Se ction 564(b)(1) of the Act, 21 U.S.C. section 360bbb-3(b)(1), unless the authorization is terminated or revoked sooner.  Performed at [facility], [place] [address] [place], [place] [address]   Aerobic/Anaerobic Culture (surgical/deep wound)     Status: None (Preliminary result)   Collection Time: 01/16/21 12:09 AM   Specimen: Abscess  Result Value Ref Range Status   Specimen Description ABSCESS LEFT BREAST  Final   Special Requests NONE   Final   Gram Stain NO WBC SEEN FEW GRAM POSITIVE COCCI   Final   Culture   Final    NO GROWTH 1 DAY Performed at [facility], [place] [address], [place], [place] [address]    Report Status PENDING  Incomplete    Radiology Reports CT Chest W Contrast  Result Date: 01/05/2021 CLINICAL DATA:  Soft tissue mass, breast mass versus infection EXAM:  CT CHEST WITH CONTRAST TECHNIQUE: Multidetector CT imaging of the chest was performed during intravenous contrast administration. CONTRAST:  162m OMNIPAQUE IOHEXOL 300 MG/ML  SOLN COMPARISON:  None. FINDINGS: Cardiovascular: Aortic atherosclerosis. Normal heart size. Three-vessel coronary artery calcifications and/or stents. No pericardial effusion. The central superior vena cava is narrowed and effaced with intraluminal calcifications and extensive superficial vascular collateralization about the chest wall (series 3, image 43, series 6, image 75). Mediastinum/Nodes: No enlarged mediastinal or hilar lymph nodes. Enlarged left axillary and subpectoral lymph nodes measuring up to 2.0 x 1.2 cm (series 3, image 25). Thyroid gland, trachea, and esophagus demonstrate no significant findings. Lungs/Pleura: Lungs are clear. No pleural effusion or pneumothorax. Upper Abdomen: No acute abnormality. Atrophic appearance of the partially included kidneys in the upper abdomen. Musculoskeletal: There is extensive subcutaneous fat stranding and skin thickening of the left breast. There is a internally hypoattenuating mass or fluid collection in the subareolar left breast measuring 7.3 x 7.0 x 4.8 cm (series 3, image 69, series 6, image 37). IMPRESSION: 1. There is extensive subcutaneous fat stranding and skin thickening of the left breast. There is a internally hypoattenuating mass or fluid collection in the subareolar left breast measuring 7.3 x 7.0 x 4.8 cm. Enlarged left axillary and subpectoral lymph nodes. This may reflect a benign breast abscess depending upon clinical  presentation, but locally advanced malignancy with involvement of the skin and lymph nodes would be difficult to confidently distinguish based on CT. Recommend initial targeted ultrasound to assess for drainable fluid collection. 2. The central superior vena cava is narrowed and effaced with intraluminal calcifications and extensive superficial vascular collateralization about the chest wall, generally suggestive chronic central venous stenosis secondary to prior indwelling catheter access. 3. Coronary artery disease and aortic atherosclerosis, markedly advanced for patient age. Aortic Atherosclerosis (ICD10-I70.0). Electronically Signed   By: AEddie CandleM.D.   On: 01/05/2021 14:00   Portable chest 1 View  Result Date: 01/16/2021 CLINICAL DATA:  COVID-19. EXAM: PORTABLE CHEST 1 VIEW COMPARISON:  Chest CT 01/05/2021 FINDINGS: Cardiac contours upper limits of normal. No large area pulmonary consolidation. Minimal left basilar atelectasis. No pleural effusion or pneumothorax. Osseous structures unremarkable. IMPRESSION: No acute cardiopulmonary process. Electronically Signed   By: DLovey NewcomerM.D.   On: 01/16/2021 08:13   [place]BREAST COMPLETE UNI LEFT INC AXILLA  Result Date: 01/05/2021 CLINICAL DATA:  Follow-up abnormal CT chest. EXAM: ULTRASOUND OF THE LEFT BREAST COMPARISON:  CT 01/05/2021. FINDINGS: Left breast ultrasound reveals a 6.2 x 4.5 x 7.2 cm complex cystic mass. Although tumor cannot be excluded, abscess and or hematoma would be more likely. Close follow-up exam suggested to demonstrate complete resolution in order to exclude a tumor. IMPRESSION: Left breast soft tissue edema. Left breast complex cystic mass. Although tumor cannot be excluded, abscess and or hematoma would be more likely. Aspiration of the left breast cystic mass should be considered. RECOMMENDATION: Aspiration of the left breast cystic mass should be considered. Close follow-up exams suggested to demonstrate complete resolution in  order to exclude a tumor. I BI-RADS CATEGORY  BI-RADS category 4. Electronically Signed   By: TMarcello Moores Register   On: 01/05/2021 15:57

## 2021-01-19 DIAGNOSIS — U071 COVID-19: Secondary | ICD-10-CM | POA: Diagnosis not present

## 2021-01-19 DIAGNOSIS — N611 Abscess of the breast and nipple: Secondary | ICD-10-CM | POA: Diagnosis not present

## 2021-01-19 LAB — CBC WITH DIFFERENTIAL/PLATELET
Abs Immature Granulocytes: 0.04 10*3/uL (ref 0.00–0.07)
Basophils Absolute: 0 10*3/uL (ref 0.0–0.1)
Basophils Relative: 1 %
Eosinophils Absolute: 0.1 10*3/uL (ref 0.0–0.5)
Eosinophils Relative: 2 %
HCT: 26.5 % — ABNORMAL LOW (ref 36.0–46.0)
Hemoglobin: 8 g/dL — ABNORMAL LOW (ref 12.0–15.0)
Immature Granulocytes: 1 %
Lymphocytes Relative: 13 %
Lymphs Abs: 0.8 10*3/uL (ref 0.7–4.0)
MCH: 29.4 pg (ref 26.0–34.0)
MCHC: 30.2 g/dL (ref 30.0–36.0)
MCV: 97.4 fL (ref 80.0–100.0)
Monocytes Absolute: 0.4 10*3/uL (ref 0.1–1.0)
Monocytes Relative: 6 %
Neutro Abs: 4.5 10*3/uL (ref 1.7–7.7)
Neutrophils Relative %: 77 %
Platelets: 272 10*3/uL (ref 150–400)
RBC: 2.72 MIL/uL — ABNORMAL LOW (ref 3.87–5.11)
RDW: 16.3 % — ABNORMAL HIGH (ref 11.5–15.5)
WBC: 5.8 10*3/uL (ref 4.0–10.5)
nRBC: 0 % (ref 0.0–0.2)

## 2021-01-19 LAB — COMPREHENSIVE METABOLIC PANEL
ALT: 11 U/L (ref 0–44)
AST: 16 U/L (ref 15–41)
Albumin: 2.3 g/dL — ABNORMAL LOW (ref 3.5–5.0)
Alkaline Phosphatase: 36 U/L — ABNORMAL LOW (ref 38–126)
Anion gap: 15 (ref 5–15)
BUN: 30 mg/dL — ABNORMAL HIGH (ref 6–20)
CO2: 30 mmol/L (ref 22–32)
Calcium: 8.7 mg/dL — ABNORMAL LOW (ref 8.9–10.3)
Chloride: 90 mmol/L — ABNORMAL LOW (ref 98–111)
Creatinine, Ser: 8.52 mg/dL — ABNORMAL HIGH (ref 0.44–1.00)
GFR, Estimated: 6 mL/min — ABNORMAL LOW (ref >60)
Glucose, Bld: 87 mg/dL (ref 70–99)
Potassium: 3.3 mmol/L — ABNORMAL LOW (ref 3.5–5.1)
Sodium: 135 mmol/L (ref 135–145)
Total Bilirubin: 1 mg/dL (ref 0.3–1.2)
Total Protein: 6 g/dL — ABNORMAL LOW (ref 6.5–8.1)

## 2021-01-19 LAB — BRAIN NATRIURETIC PEPTIDE: B Natriuretic Peptide: 1122.4 pg/mL — ABNORMAL HIGH (ref 0.0–100.0)

## 2021-01-19 LAB — PROCALCITONIN: Procalcitonin: 1.27 ng/mL

## 2021-01-19 LAB — C-REACTIVE PROTEIN: CRP: 7.3 mg/dL — ABNORMAL HIGH (ref <1.0)

## 2021-01-19 LAB — MRSA PCR SCREENING: MRSA by PCR: NEGATIVE

## 2021-01-19 LAB — MAGNESIUM: Magnesium: 1.8 mg/dL (ref 1.7–2.4)

## 2021-01-19 LAB — D-DIMER, QUANTITATIVE: D-Dimer, Quant: 3.11 ug/mL-FEU — ABNORMAL HIGH (ref 0.00–0.50)

## 2021-01-19 MED ORDER — CHLORHEXIDINE GLUCONATE CLOTH 2 % EX PADS
6.0000 | MEDICATED_PAD | Freq: Every day | CUTANEOUS | Status: DC
  Administered 2021-01-20 – 2021-01-24 (×2): 6 via TOPICAL

## 2021-01-19 MED ORDER — POTASSIUM CHLORIDE CRYS ER 20 MEQ PO TBCR
40.0000 meq | EXTENDED_RELEASE_TABLET | Freq: Once | ORAL | Status: AC
  Administered 2021-01-19: 40 meq via ORAL
  Filled 2021-01-19: qty 2

## 2021-01-19 NOTE — Progress Notes (Signed)
Patient's has now tested negative for COVID x2 and can return to her regular seat for outpatient HD at [facility] clinic[facility]. She can ride her regular transportation. There are still questions of homelessness as patient's friend informed Navigator on Saturday that patient could not return there while COVID positive and since she is moving next month. Per conversation with CM/D. Swist, patient's friend has been notified of patient's negative COVID status. Navigator spoke with [facility] Social Worker who states she has asked patient to allow her to refer patient to Assisted Living, but patient has refused. [facility] Social Worker has offered to put a Physiological scientist in place or apply for CAP services, but patient needs to find a new place to reside before these services can be put in place. Per clinic social worker, patient has stated that she is in the process of reaching out to friends.  Patient no longer needs inpt HD in isolation, but must be moved out of 5W first. HD unit updated.  Alphonzo Cruise, [place] [facility] Navigator [phone]

## 2021-01-19 NOTE — Progress Notes (Signed)
Physical Therapy Treatment Patient Details Name: Rebekah Burton MRN: [id number] DOB: Jun 27, 1976 Today's Date: 01/19/2021    History of Present Illness 45 y.o. female with medical history significant of ESRD-HD M-W-F, blindness, HTN, PAD had developed a breast abscess and was seen in ED 01/04/21 where U/S confirmed abscess. She was to have been seen as outpatient for aspiration/drainage but did not get to make that appt. Today in HD the breast abscess opened and drained spontaneously. Because of continued drainage she presents to [facility] for evaluation. Found to be COVID+ taken to OR for further exploration, culture, and excisional debridement 01/15/21    PT Comments    Pt had pulled call bell out of wall and asking for assistance on entry. Pt eager to get out of bed and walk with therapy. Pt is mod I for bed mobility and transfers and requires minA for ambulation to provide navigational assistance in novel environment. Pt is no longer under COVID isolation after today, so she should be able to go home with her boyfriend at discharge. PT will continue to follow acutely.   Follow Up Recommendations  No PT follow up;Supervision for mobility/OOB     Equipment Recommendations  None recommended by PT    Recommendations for Other Services OT consult     Precautions / Restrictions Precautions Precautions: Other (comment) Precaution Comments: pt is blind, needs assist for navigation Restrictions Weight Bearing Restrictions: No    Mobility  Bed Mobility Overal bed mobility: Modified Independent             General bed mobility comments: use of bed rails and HoB elevated  Transfers Overall transfer level: Modified independent Equipment used: None             General transfer comment: bracing against EoB to self steady  Ambulation/Gait Ambulation/Gait assistance: Min assist Gait Distance (Feet): 150 Feet Assistive device: 1 person hand held assist Gait Pattern/deviations:  Step-through pattern;Decreased step length - right;Decreased step length - left Gait velocity: slowed Gait velocity interpretation: 1.31 - 2.62 ft/sec, indicative of limited community ambulator General Gait Details: light min A for guidance and maximal multimodal cues to navigate obstacles in hallway, requiring increased min A for minor unsteadiess at end of ambulation       Balance Overall balance assessment: Needs assistance Sitting-balance support: No upper extremity supported;Feet supported Sitting balance-Leahy Scale: Normal     Standing balance support: During functional activity;No upper extremity supported Standing balance-Leahy Scale: Fair Standing balance comment: statoic standing balance for donning gown                            Cognition Arousal/Alertness: Awake/alert Behavior During Therapy: WFL for tasks assessed/performed Overall Cognitive Status: Within Functional Limits for tasks assessed                                           General Comments General comments (skin integrity, edema, etc.): VSS on RA      Pertinent Vitals/Pain Pain Assessment: No/denies pain           PT Goals (current goals can now be found in the care plan section) Acute Rehab PT Goals Patient Stated Goal: have less pain PT Goal Formulation: With patient Time For Goal Achievement: 01/31/21 Potential to Achieve Goals: Good Progress towards PT goals: Progressing toward goals  PT Plan Current plan remains appropriate       AM-PAC PT "6 Clicks" Mobility   Outcome Measure  Help needed turning from your back to your side while in a flat bed without using bedrails?: None Help needed moving from lying on your back to sitting on the side of a flat bed without using bedrails?: None Help needed moving to and from a bed to a chair (including a wheelchair)?: None Help needed standing up from a chair using your arms (e.g., wheelchair or bedside chair)?:  None Help needed to walk in hospital room?: A Little Help needed climbing 3-5 steps with a railing? : A Little 6 Click Score: 22    End of Session   Activity Tolerance: Patient tolerated treatment well Patient left: in bed;with call bell/phone within reach;with nursing/sitter in room Nurse Communication: Mobility status PT Visit Diagnosis: Other abnormalities of gait and mobility (R26.89);Pain Pain - part of body:  (L breast)     Time: [id number] PT Time Calculation (min) (ACUTE ONLY): 18 min  Charges:  $Therapeutic Exercise: 8-22 mins                     Eddis Pingleton B. Migdalia Dk PT, DPT Acute Rehabilitation Services Pager [phone] Office [phone]    [place] 01/19/2021, 12:42 PM

## 2021-01-19 NOTE — Progress Notes (Signed)
PROGRESS NOTE                                                                                                                                                                                                             Patient Demographics:    Rebekah Burton, is a 45 y.o. female, DOB - 13-Nov-1976, [id number]  Outpatient Primary MD for the patient is Trey Sailors, [place]    LOS - 4  Admit date - 01/15/2021    Chief Complaint  Patient presents with  . Abscess       Brief Narrative (HPI from H&P)   Rebekah Burton is a 45 y.o. female with medical history significant of ESRD-HD M-W-F, blindness, HTN, PAD had developed a breast abscess and was seen in ED 01/04/21 where U/S confirmed abscess, she was admitted for left breast abscess incision and drainage and was found to have incidental COVID infection.   Subjective:   Patient in bed, appears comfortable, denies any headache, no fever, no chest pain or pressure, no shortness of breath , no abdominal pain. No focal weakness.   Assessment  & Plan :     Acute Covid 19 Infection ( incidental)  - she has no evidence of pulmonary involvement or COVID-specific symptoms, monitor.  CRP is elevated due to left breast abscess.  She had one positive and one negative test in the last few days, repeat 3 rd test -ve. DC Isolation. Removed quarantine 01/19/21.  Encouraged the patient to sit up in chair in the daytime use I-S and flutter valve for pulmonary toiletry and then prone in bed when at night.  Will advance activity and titrate down oxygen as possible.   Recent Labs  Lab 01/15/21 1753 01/15/21 1755 01/15/21 1855 01/16/21 0512 01/16/21 0821 01/17/21 0204 01/17/21 0205 01/17/21 1145 01/18/21 0136 01/18/21 0913 01/19/21 0030 01/19/21 0038  WBC  --   --  10.9*  --  8.2 6.6  --   --  6.4  --   --  5.8  HGB  --   --  9.6*  --  9.2* 8.4*  --   --  8.2*  --   --  8.0*  HCT  --   --   30.0*  --  29.5* 27.5*  --   --  27.6*  --   --  26.5*  PLT  --   --  347  --  331 315  --   --  290  --   --  272  CRP  --   --  17.8* 17.7*  --  14.1*  --   --  10.1*  --   --  7.3*  BNP  --   --   --  707.3*  --   --  1,072.2*  --  1,410.7*  --  1,122.4*  --   DDIMER  --   --   --  2.11*  --  2.24*  --   --  2.50*  --   --  3.11*  PROCALCITON  --   --   --  1.62  --   --   --  1.46 1.22  --   --  1.27  AST  --   --   --  15  --  11*  --   --  11*  --   --  16  ALT  --   --   --  12  --  6  --   --  7  --   --  11  ALKPHOS  --   --   --  41  --  35*  --   --  35*  --   --  36*  BILITOT  --   --   --  0.7  --  0.7  --   --  0.5  --   --  1.0  ALBUMIN  --   --   --  2.5*  --  2.2*  --   --  2.2*  --   --  2.3*  SARSCOV2NAA POSITIVE* NEGATIVE  --   --   --   --   --   --   --  NEGATIVE  --   --     2. Left breast abscess.  I&D done by general surgery, on Vancomycin, Unasyn added. Follow cultures, prelim growing gram-positive cocci, MRSA nasal PCR pending, DC IV Abx >> PO ABX once final cultures, follow cytology as well.   3. ESRD.  MWF schedule, Renal following.  4. AOCD - monitor.  5.HTN - on home meds.          Condition - Fair  Family Communication  Joanell Rising on [phone] 01/18/2021 at 9:17 AM  Code Status :  Full  Consults  :  CCS, Renal  Procedures  :    CT - : 1. There is extensive subcutaneous fat stranding and skin thickening of the left breast. There is a internally hypoattenuating mass or fluid collection in the subareolar left breast measuring 7.3 x 7.0 x 4.8 cm. Enlarged left axillary and subpectoral lymph nodes. This may reflect a benign breast abscess depending upon clinical presentation, but locally advanced malignancy with involvement of the skin and lymph nodes would be difficult to confidently distinguish based on CT. Recommend initial targeted ultrasound to assess for drainable fluid collection. 2. The central superior vena cava is narrowed and effaced with  intraluminal calcifications and extensive superficial vascular collateralization about the chest wall, generally suggestive chronic central venous stenosis secondary to prior indwelling catheter access. 3. Coronary artery disease and aortic atherosclerosis, markedly advanced for patient age. Aortic Atherosclerosis.   L.Breast I&D by CCS on 01/15/21   PUD Prophylaxis : None  Disposition Plan  :    Status is: Inpatient  Remains inpatient appropriate because:IV treatments  appropriate due to intensity of illness or inability to take PO   Dispo: The patient is from: Home              Anticipated d/c is to: Home              Anticipated d/c date is: > 3 days              Patient currently is not medically stable to d/c.   Difficult to place patient No  DVT Prophylaxis  :   Heparin   Lab Results  Component Value Date   PLT 272 01/19/2021    Diet :  Diet Order            Diet renal with fluid restriction Fluid restriction: 1200 mL Fluid; Room service appropriate? Yes; Fluid consistency: Thin  Diet effective now                  Inpatient Medications  Scheduled Meds: . carvedilol  25 mg Oral BID WC  . Chlorhexidine Gluconate Cloth  6 each Topical Q0600  . cloNIDine  0.3 mg Oral TID  . heparin  5,000 Units Subcutaneous Q8H  . lidocaine-prilocaine  1 application Topical Q M,W,F-HD  . multivitamin  1 tablet Oral QHS  . senna  1 tablet Oral BID  . sevelamer carbonate  3,200 mg Oral TID WC  . sodium chloride flush  3 mL Intravenous Q12H   Continuous Infusions: . sodium chloride    . ampicillin-sulbactam (UNASYN) IV 3 g (01/18/21 2217)  . vancomycin Stopped (01/18/21 1854)   PRN Meds:.sodium chloride, acetaminophen **OR** [DISCONTINUED] acetaminophen, HYDROcodone-acetaminophen, morphine injection, sodium chloride flush  Antibiotics  :    Anti-infectives (From admission, onward)   Start     Dose/Rate Route Frequency Ordered Stop   01/18/21 1200  vancomycin (VANCOCIN) IVPB  1000 mg/200 mL premix        1,000 mg 200 mL/hr over 60 Minutes Intravenous Every M-W-F (Hemodialysis) 01/16/21 1017     01/17/21 2200  Ampicillin-Sulbactam (UNASYN) 3 g in sodium chloride 0.9 % 100 mL IVPB        3 g 200 mL/hr over 30 Minutes Intravenous Daily at bedtime 01/16/21 1058     01/17/21 1000  remdesivir 100 mg in sodium chloride 0.9 % 100 mL IVPB       "Followed by" Linked Group Details   100 mg 200 mL/hr over 30 Minutes Intravenous Daily 01/15/21 2357 01/18/21 0918   01/16/21 1145  Ampicillin-Sulbactam (UNASYN) 3 g in sodium chloride 0.9 % 100 mL IVPB        3 g 200 mL/hr over 30 Minutes Intravenous  Once 01/16/21 1058 01/16/21 1417   01/16/21 1100  vancomycin (VANCOREADY) IVPB 1500 mg/300 mL        1,500 mg 150 mL/hr over 120 Minutes Intravenous  Once 01/16/21 1017 01/16/21 1849   01/16/21 0300  remdesivir 200 mg in sodium chloride 0.9% 250 mL IVPB       "Followed by" Linked Group Details   200 mg 580 mL/hr over 30 Minutes Intravenous Once 01/15/21 2357 01/16/21 0543   01/15/21 2345  amoxicillin-clavulanate (AUGMENTIN) 875-125 MG per tablet 1 tablet  Status:  Discontinued        1 tablet Oral Every 12 hours 01/15/21 2334 01/15/21 2357   01/15/21 1800  vancomycin (VANCOCIN) IVPB 1000 mg/200 mL premix        1,000 mg 200 mL/hr over 60 Minutes Intravenous  Once 01/15/21  1753 01/15/21 2120       Time Spent in minutes  30   Lala Lund M.D on 01/19/2021 at 11:24 AM  To page go to [email or web address]   [facility] -  Office  [phone]    See all Orders from today for further details    Objective:   Vitals:   01/18/21 1942 01/18/21 2100 01/18/21 2218 01/19/21 0430  BP: 127/79 (!) 101/52 107/64 121/66  Pulse:  72 77 74  Resp:  19  18  Temp:  98.4 F (36.9 C)  97.9 F (36.6 C)  TempSrc:  Oral  Oral  SpO2:  100%  100%  Weight:      Height:        Wt Readings from Last 3 Encounters:  01/18/21 106.5 kg  10/23/20 109.3 kg  08/18/20 103.4 kg      Intake/Output Summary (Last 24 hours) at 01/19/2021 1124 Last data filed at 01/19/2021 0839 Gross per 24 hour  Intake 820 ml  Output 1562 ml  Net -742 ml     Physical Exam  Awake Alert, No new F.N deficits, Normal affect [place].AT,PERRAL Supple Neck,No JVD, No cervical lymphadenopathy appriciated.  Symmetrical Chest wall movement, Good air movement bilaterally, CTAB RRR,No Gallops, Rubs or new Murmurs, No Parasternal Heave +ve B.Sounds, Abd Soft, No tenderness, No organomegaly appriciated, No rebound - guarding or rigidity. No Cyanosis, Clubbing or edema, No new Rash or bruise  L. Breast under bandage, was examined with surgery PA on 01/16/21 - see [facility]CS note & picture    Data Review:    CBC Recent Labs  Lab 01/15/21 1855 01/16/21 0821 01/17/21 0204 01/18/21 0136 01/19/21 0038  WBC 10.9* 8.2 6.6 6.4 5.8  HGB 9.6* 9.2* 8.4* 8.2* 8.0*  HCT 30.0* 29.5* 27.5* 27.6* 26.5*  PLT 347 331 315 290 272  MCV 97.4 99.0 100.0 99.3 97.4  MCH 31.2 30.9 30.5 29.5 29.4  MCHC 32.0 31.2 30.5 29.7* 30.2  RDW 16.3* 16.4* 16.4* 16.0* 16.3*  LYMPHSABS 0.8  --  1.0 0.9 0.8  MONOABS 0.9  --  0.4 0.4 0.4  EOSABS 0.1  --  0.3 0.3 0.1  BASOSABS 0.0  --  0.0 0.0 0.0    Recent Labs  Lab 01/15/21 1855 01/16/21 0512 01/17/21 0204 01/17/21 0205 01/17/21 1145 01/18/21 0136 01/19/21 0030 01/19/21 0038  NA 140 141 138  --   --  137  --  135  K 3.1* 3.8 3.7  --   --  3.9  --  3.3*  CL 93* 94* 94*  --   --  92*  --  90*  CO2 34* 32 29  --   --  30  --  30  GLUCOSE 83 90 134*  --   --  107*  --  87  BUN 15 20 29*  --   --  40*  --  30*  CREATININE 6.02* 7.07* 8.84*  --   --  10.94*  --  8.52*  CALCIUM 9.1 9.0 8.9  --   --  9.1  --  8.7*  AST  --  15 11*  --   --  11*  --  16  ALT  --  12 6  --   --  7  --  11  ALKPHOS  --  41 35*  --   --  35*  --  36*  BILITOT  --  0.7 0.7  --   --  0.5  --  1.0  ALBUMIN  --  2.5* 2.2*  --   --  2.2*  --  2.3*  MG  --  2.0 2.0  --   --  2.0  --  1.8   CRP 17.8* 17.7* 14.1*  --   --  10.1*  --  7.3*  DDIMER  --  2.11* 2.24*  --   --  2.50*  --  3.11*  PROCALCITON  --  1.62  --   --  1.46 1.22  --  1.27  BNP  --  707.3*  --  1,072.2*  --  1,410.7* 1,122.4*  --     ------------------------------------------------------------------------------------------------------------------ No results for input(s): CHOL, HDL, LDLCALC, TRIG, CHOLHDL, LDLDIRECT in the last 72 hours.  Lab Results  Component Value Date   HGBA1C 5.2 01/16/2019   ------------------------------------------------------------------------------------------------------------------ No results for input(s): TSH, T4TOTAL, T3FREE, THYROIDAB in the last 72 hours.  Invalid input(s): FREET3  Cardiac Enzymes No results for input(s): CKMB, TROPONINI, MYOGLOBIN in the last 168 hours.  Invalid input(s): CK ------------------------------------------------------------------------------------------------------------------    Component Value Date/Time   BNP 1,122.4 (H) 01/19/2021 0030    Micro Results Recent Results (from the past 240 hour(s))  SARS Coronavirus 2 by RT PCR (hospital order, performed in [facility] hospital lab) Nasopharyngeal Nasopharyngeal Swab     Status: Abnormal   Collection Time: 01/15/21  5:53 PM   Specimen: Nasopharyngeal Swab  Result Value Ref Range Status   SARS Coronavirus 2 POSITIVE (A) NEGATIVE Final    Comment: RESULT CALLED TO, READ BACK BY AND VERIFIED WITH: Eustaquio Boyden RN 01/15/21 2209 JDW (NOTE) SARS-CoV-2 target nucleic acids are DETECTED  SARS-CoV-2 RNA is generally detectable in upper respiratory specimens  during the acute phase of infection.  Positive results are indicative  of the presence of the identified virus, but do not rule out bacterial infection or co-infection with other pathogens not detected by the test.  Clinical correlation with patient history and  other diagnostic information is necessary to determine  patient infection status.  The expected result is negative.  Fact Sheet for Patients:   StrictlyIdeas.no   Fact Sheet for Healthcare Providers:   BankingDealers.co.za    This test is not yet approved or cleared by the [place] FDA and  has been authorized for detection and/or diagnosis of SARS-CoV-2 by FDA under an Emergency Use Authorization (EUA).  This EUA will remain in effect (meaning this  test can be used) for the duration of  the COVID-19 declaration under Section 564(b)(1) of the Act, 21 U.S.C. section 360-bbb-3(b)(1), unless the authorization is terminated or revoked sooner.  Performed at [facility], [place] [address], [place], [place] [address]   SARS CORONAVIRUS 2 (TAT 6-24 HRS) Nasopharyngeal Nasopharyngeal Swab     Status: None   Collection Time: 01/15/21  5:55 PM   Specimen: Nasopharyngeal Swab  Result Value Ref Range Status   SARS Coronavirus 2 NEGATIVE NEGATIVE Final    Comment: (NOTE) SARS-CoV-2 target nucleic acids are NOT DETECTED.  The SARS-CoV-2 RNA is generally detectable in upper and lower respiratory specimens during the acute phase of infection. Negative results do not preclude SARS-CoV-2 infection, do not rule out co-infections with other pathogens, and should not be used as the sole basis for treatment or other patient management decisions. Negative results must be combined with clinical observations, patient history, and epidemiological information. The expected result is Negative.  Fact Sheet for Patients: SugarRoll.be  Fact Sheet for Healthcare Providers: https://www.woods-mathews.com/  This test is not yet approved or cleared by the [place] and  has been authorized for detection and/or diagnosis of SARS-CoV-2 by FDA under an Emergency Use Authorization (EUA). This EUA will remain  in effect (meaning this test can be used) for the  duration of the COVID-19 declaration under Se ction 564(b)(1) of the Act, 21 U.S.C. section 360bbb-3(b)(1), unless the authorization is terminated or revoked sooner.  Performed at [facility], [place] [address], [place], [place] [address]   Aerobic/Anaerobic Culture (surgical/deep wound)     Status: None (Preliminary result)   Collection Time: 01/16/21 12:09 AM   Specimen: Abscess  Result Value Ref Range Status   Specimen Description ABSCESS LEFT BREAST  Final   Special Requests NONE  Final   Gram Stain NO WBC SEEN FEW GRAM POSITIVE COCCI   Final   Culture   Final    HOLDING FOR POSSIBLE ANAEROBE Performed at [facility], [address], [place], [place] [address]    Report Status PENDING  Incomplete  SARS CORONAVIRUS 2 (TAT 6-24 HRS) Nasopharyngeal Nasopharyngeal Swab     Status: None   Collection Time: 01/18/21  9:13 AM   Specimen: Nasopharyngeal Swab  Result Value Ref Range Status   SARS Coronavirus 2 NEGATIVE NEGATIVE Final    Comment: (NOTE) SARS-CoV-2 target nucleic acids are NOT DETECTED.  The SARS-CoV-2 RNA is generally detectable in upper and lower respiratory specimens during the acute phase of infection. Negative results do not preclude SARS-CoV-2 infection, do not rule out co-infections with other pathogens, and should not be used as the sole basis for treatment or other patient management decisions. Negative results must be combined with clinical observations, patient history, and epidemiological information. The expected result is Negative.  Fact Sheet for Patients: SugarRoll.be  Fact Sheet for Healthcare Providers: https://www.woods-mathews.com/  This test is not yet approved or cleared by the [place] FDA and  has been authorized for detection and/or diagnosis of SARS-CoV-2 by FDA under an Emergency Use Authorization (EUA). This EUA will remain  in effect (meaning this test can be used) for the  duration of the COVID-19 declaration under Se ction 564(b)(1) of the Act, 21 U.S.C. section 360bbb-3(b)(1), unless the authorization is terminated or revoked sooner.  Performed at [facility], [place] [address], [place], [place] [address]     Radiology Reports CT Chest W Contrast  Result Date: 01/05/2021 CLINICAL DATA:  Soft tissue mass, breast mass versus infection EXAM: CT CHEST WITH CONTRAST TECHNIQUE: Multidetector CT imaging of the chest was performed during intravenous contrast administration. CONTRAST:  1101m OMNIPAQUE IOHEXOL 300 MG/ML  SOLN COMPARISON:  None. FINDINGS: Cardiovascular: Aortic atherosclerosis. Normal heart size. Three-vessel coronary artery calcifications and/or stents. No pericardial effusion. The central superior vena cava is narrowed and effaced with intraluminal calcifications and extensive superficial vascular collateralization about the chest wall (series 3, image 43, series 6, image 75). Mediastinum/Nodes: No enlarged mediastinal or hilar lymph nodes. Enlarged left axillary and subpectoral lymph nodes measuring up to 2.0 x 1.2 cm (series 3, image 25). Thyroid gland, trachea, and esophagus demonstrate no significant findings. Lungs/Pleura: Lungs are clear. No pleural effusion or pneumothorax. Upper Abdomen: No acute abnormality. Atrophic appearance of the partially included kidneys in the upper abdomen. Musculoskeletal: There is extensive subcutaneous fat stranding and skin thickening of the left breast. There is a internally hypoattenuating mass or fluid collection in the subareolar left breast measuring 7.3 x 7.0 x 4.8 cm (series 3,  image 69, series 6, image 37). IMPRESSION: 1. There is extensive subcutaneous fat stranding and skin thickening of the left breast. There is a internally hypoattenuating mass or fluid collection in the subareolar left breast measuring 7.3 x 7.0 x 4.8 cm. Enlarged left axillary and subpectoral lymph nodes. This may reflect a benign breast  abscess depending upon clinical presentation, but locally advanced malignancy with involvement of the skin and lymph nodes would be difficult to confidently distinguish based on CT. Recommend initial targeted ultrasound to assess for drainable fluid collection. 2. The central superior vena cava is narrowed and effaced with intraluminal calcifications and extensive superficial vascular collateralization about the chest wall, generally suggestive chronic central venous stenosis secondary to prior indwelling catheter access. 3. Coronary artery disease and aortic atherosclerosis, markedly advanced for patient age. Aortic Atherosclerosis (ICD10-I70.0). Electronically Signed   By: Eddie Candle M.D.   On: 01/05/2021 14:00   Portable chest 1 View  Result Date: 01/16/2021 CLINICAL DATA:  COVID-19. EXAM: PORTABLE CHEST 1 VIEW COMPARISON:  Chest CT 01/05/2021 FINDINGS: Cardiac contours upper limits of normal. No large area pulmonary consolidation. Minimal left basilar atelectasis. No pleural effusion or pneumothorax. Osseous structures unremarkable. IMPRESSION: No acute cardiopulmonary process. Electronically Signed   By: Lovey Newcomer M.D.   On: 01/16/2021 08:13   US BREAST COMPLETE UNI LEFT INC AXILLA  Result Date: 01/05/2021 CLINICAL DATA:  Follow-up abnormal CT chest. EXAM: ULTRASOUND OF THE LEFT BREAST COMPARISON:  CT 01/05/2021. FINDINGS: Left breast ultrasound reveals a 6.2 x 4.5 x 7.2 cm complex cystic mass. Although tumor cannot be excluded, abscess and or hematoma would be more likely. Close follow-up exam suggested to demonstrate complete resolution in order to exclude a tumor. IMPRESSION: Left breast soft tissue edema. Left breast complex cystic mass. Although tumor cannot be excluded, abscess and or hematoma would be more likely. Aspiration of the left breast cystic mass should be considered. RECOMMENDATION: Aspiration of the left breast cystic mass should be considered. Close follow-up exams suggested to  demonstrate complete resolution in order to exclude a tumor. I BI-RADS CATEGORY  BI-RADS category 4. Electronically Signed   By: Marcello Moores  Register   On: 01/05/2021 15:57

## 2021-01-19 NOTE — Progress Notes (Signed)
[facility] Progress Note  4 Days Post-Op  Subjective: CC-  Still has some congestion, but otherwise doing ok. Tolerating dressing changes well. WBC 5.8, afebrile.  Objective: Vital signs in last 24 hours: Temp:  [97.7 F (36.5 C)-98.4 F (36.9 C)] 97.9 F (36.6 C) (01/25 0430) Pulse Rate:  [72-77] 74 (01/25 0430) Resp:  [17-19] 18 (01/25 0430) BP: (94-164)/(52-98) 121/66 (01/25 0430) SpO2:  [99 %-100 %] 100 % (01/25 0430) Weight:  [106.5 kg-108.1 kg] 106.5 kg (01/24 1834) Last BM Date: 01/15/21  Intake/Output from previous day: 01/24 0701 - 01/25 0700 In: 580 [P.O.:480; IV Piggyback:100] Out: 1562  Intake/Output this shift: Total I/O In: 240 [P.O.:240] Out: -   PE: Gen: Alert, NAD, pleasant Pulm: rate and effort normal Abd: Soft, NT/ND Skin: warm and dry Left breast: wound ~4x6cm and very deep with some surrounding induration>> induration is less and present from about 10 to 12 o'clock position, no purulent drainage and no active bleeding  Lab Results:  Recent Labs    01/18/21 0136 01/19/21 0038  WBC 6.4 5.8  HGB 8.2* 8.0*  HCT 27.6* 26.5*  PLT 290 272   BMET Recent Labs    01/18/21 0136 01/19/21 0038  NA 137 135  K 3.9 3.3*  CL 92* 90*  CO2 30 30  GLUCOSE 107* 87  BUN 40* 30*  CREATININE 10.94* 8.52*  CALCIUM 9.1 8.7*   PT/INR No results for input(s): LABPROT, INR in the last 72 hours. CMP     Component Value Date/Time   NA 135 01/19/2021 0038   K 3.3 (L) 01/19/2021 0038   CL 90 (L) 01/19/2021 0038   CO2 30 01/19/2021 0038   GLUCOSE 87 01/19/2021 0038   BUN 30 (H) 01/19/2021 0038   CREATININE 8.52 (H) 01/19/2021 0038   CALCIUM 8.7 (L) 01/19/2021 0038   PROT 6.0 (L) 01/19/2021 0038   ALBUMIN 2.3 (L) 01/19/2021 0038   AST 16 01/19/2021 0038   ALT 11 01/19/2021 0038   ALKPHOS 36 (L) 01/19/2021 0038   BILITOT 1.0 01/19/2021 0038   GFRNONAA 6 (L) 01/19/2021 0038   GFRAA 5 (L) 02/13/2019 0734   Lipase     Component  Value Date/Time   LIPASE 33 07/17/2018 1145       Studies/Results: No results found.  Anti-infectives: Anti-infectives (From admission, onward)   Start     Dose/Rate Route Frequency Ordered Stop   01/18/21 1200  vancomycin (VANCOCIN) IVPB 1000 mg/200 mL premix        1,000 mg 200 mL/hr over 60 Minutes Intravenous Every M-W-F (Hemodialysis) 01/16/21 1017     01/17/21 2200  Ampicillin-Sulbactam (UNASYN) 3 g in sodium chloride 0.9 % 100 mL IVPB        3 g 200 mL/hr over 30 Minutes Intravenous Daily at bedtime 01/16/21 1058     01/17/21 1000  remdesivir 100 mg in sodium chloride 0.9 % 100 mL IVPB       "Followed by" Linked Group Details   100 mg 200 mL/hr over 30 Minutes Intravenous Daily 01/15/21 2357 01/18/21 0918   01/16/21 1145  Ampicillin-Sulbactam (UNASYN) 3 g in sodium chloride 0.9 % 100 mL IVPB        3 g 200 mL/hr over 30 Minutes Intravenous  Once 01/16/21 1058 01/16/21 1417   01/16/21 1100  vancomycin (VANCOREADY) IVPB 1500 mg/300 mL        1,500 mg 150 mL/hr over 120 Minutes Intravenous  Once 01/16/21 1017 01/16/21 1849  01/16/21 0300  remdesivir 200 mg in sodium chloride 0.9% 250 mL IVPB       "Followed by" Linked Group Details   200 mg 580 mL/hr over 30 Minutes Intravenous Once 01/15/21 2357 01/16/21 0543   01/15/21 2345  amoxicillin-clavulanate (AUGMENTIN) 875-125 MG per tablet 1 tablet  Status:  Discontinued        1 tablet Oral Every 12 hours 01/15/21 2334 01/15/21 2357   01/15/21 1800  vancomycin (VANCOCIN) IVPB 1000 mg/200 mL premix        1,000 mg 200 mL/hr over 60 Minutes Intravenous  Once 01/15/21 1753 01/15/21 2120       Assessment/Plan ESRD PAD Blind Chronic anemia Obesity BMI 40.76 COVID+  Left breast abscess S/pIncision and drainage of left breast abscess with excisional debridement1/21 Dr. Zenia Resides -POD#4 - concern for underlying malignancy, Path and Cx pending, cx currently with gram positive cocci - Continue BID wet to dry dressing  changes.Ok to shower with wound open. Continue IV antibiotics and follow cx and path. Recommend continuing 5 more days of antibiotics, ok to transition to oral if discharged (end date 01/24/21). Wound care instructions and follow up info on AVS. Ok for discharge once dispo arranged. Home health orders placed. We will follow peripherally for now, and watch for path/cx data to result.  ID -vancomycin 1/21>>, unasyn 1/22>> FEN -renal diet VTE -SCDs, sq heparin   LOS: 4 days    Wellington Hampshire, [facility] 01/19/2021, 10:25 AM Please see Amion for pager number during day hours 7:00am-4:30pm

## 2021-01-19 NOTE — Progress Notes (Signed)
[facility] Progress Note   Subjective:   Off COVID isolation after two negative tests. Reports ongoing breast pain. Denies SOB, CP, palpitations, dizziness, and SOB.   Objective Vitals:   01/18/21 1942 01/18/21 2100 01/18/21 2218 01/19/21 0430  BP: 127/79 (!) 101/52 107/64 121/66  Pulse:  72 77 74  Resp:  19  18  Temp:  98.4 F (36.9 C)  97.9 F (36.6 C)  TempSrc:  Oral  Oral  SpO2:  100%  100%  Weight:      Height:       Physical Exam General: Well developed female, alert and in NAD Heart: RRR, no murmurs, rubs or gallops Lungs: CTA bilaterally without wheezing, rhonchi or rales Abdomen: Soft, non-tender, non-distended, +BS Extremities: No edema b/l lower extremities Dialysis Access: LUE AVF + bruit  Additional Objective Labs: Basic Metabolic Panel: Recent Labs  Lab 01/17/21 0204 01/18/21 0136 01/19/21 0038  NA 138 137 135  K 3.7 3.9 3.3*  CL 94* 92* 90*  CO2 '29 30 30  '$ GLUCOSE 134* 107* 87  BUN 29* 40* 30*  CREATININE 8.84* 10.94* 8.52*  CALCIUM 8.9 9.1 8.7*   Liver Function Tests: Recent Labs  Lab 01/17/21 0204 01/18/21 0136 01/19/21 0038  AST 11* 11* 16  ALT '6 7 11  '$ ALKPHOS 35* 35* 36*  BILITOT 0.7 0.5 1.0  PROT 5.9* 6.0* 6.0*  ALBUMIN 2.2* 2.2* 2.3*   No results for input(s): LIPASE, AMYLASE in the last 168 hours. CBC: Recent Labs  Lab 01/15/21 1855 01/16/21 0821 01/17/21 0204 01/18/21 0136 01/19/21 0038  WBC 10.9* 8.2 6.6 6.4 5.8  NEUTROABS 9.1*  --  4.9 4.7 4.5  HGB 9.6* 9.2* 8.4* 8.2* 8.0*  HCT 30.0* 29.5* 27.5* 27.6* 26.5*  MCV 97.4 99.0 100.0 99.3 97.4  PLT 347 331 315 290 272   Blood Culture    Component Value Date/Time   SDES ABSCESS LEFT BREAST 01/16/2021 0009   SPECREQUEST NONE 01/16/2021 0009   CULT  01/16/2021 0009    HOLDING FOR POSSIBLE ANAEROBE Performed at [facility], [place] [address], [place], [place] [address]    REPTSTATUS PENDING 01/16/2021 0009    Cardiac Enzymes: No results for  input(s): CKTOTAL, CKMB, CKMBINDEX, TROPONINI in the last 168 hours. CBG: No results for input(s): GLUCAP in the last 168 hours. Iron Studies: No results for input(s): IRON, TIBC, TRANSFERRIN, FERRITIN in the last 72 hours. '@lablastinr3'$ @ Studies/Results: No results found. Medications: . sodium chloride    . ampicillin-sulbactam (UNASYN) IV 3 g (01/18/21 2217)  . vancomycin Stopped (01/18/21 1854)   . carvedilol  25 mg Oral BID WC  . Chlorhexidine Gluconate Cloth  6 each Topical Q0600  . cloNIDine  0.3 mg Oral TID  . heparin  5,000 Units Subcutaneous Q8H  . lidocaine-prilocaine  1 application Topical Q M,W,F-HD  . multivitamin  1 tablet Oral QHS  . potassium chloride  40 mEq Oral Once  . senna  1 tablet Oral BID  . sevelamer carbonate  3,200 mg Oral TID WC  . sodium chloride flush  3 mL Intravenous Q12H    Dialysis Orders: MWF    4h 1mn  2/2.5 bath  105kg  AVF  Heparin ? parsabiv 15 mg three times a week with HD mircera 150 mcg every 2 weeks - last given 01/13/21 hectorol 3 mcg three times a week   Assessment/Plan: #End-stage renal disease - HD per MWF schedule. Next HD tomorrow on schedule.   # Left  breast abscess - S/p I&D by general surgery. Antibiotics per primary team  # covid 19 infection  - Therapies per primary team. Off covid isolation s/p two negative tests  # Acute hypoxic resp failure  - supportive care including oxygen  - optimize volume status as able with HD  - covid management as above -syptoms improved  # HTN  - acceptable control  # Anemia CKD -Hgb 8.0. -ESArecently given, not due for next dose yet.  # Metabolic bone disease - will check phosphorus with next labs.  - continue hectorol  - not able to get parsabiv here  Anice Paganini, PA-C 01/19/2021, 12:32 PM  [facility] Pager: [phone]

## 2021-01-19 NOTE — Progress Notes (Signed)
Pharmacy Antibiotic Note  Rebekah Burton is a 45 y.o. female admitted on 01/15/2021 with L-breast abscess/cellulitis. Pharmacy has been consulted for Vancomycin dosing.  The patient received Vancomycin 1g in the ED on 1/21. ESRD-MWF. Received HD on 1/24 for 2.5 hours  Plan: - Continue Vancomycin 1g/HD-MWF  - Continue Unasyn 3g IV  daily at bedtime - Will continue to follow HD schedule/duration, culture results, LOT, and antibiotic de-escalation plans   Height: '5\' 4"'$  (162.6 cm) Weight: 106.5 kg (234 lb 12.6 oz) IBW/kg (Calculated) : 54.7  Temp (24hrs), Avg:98 F (36.7 C), Min:97.7 F (36.5 C), Max:98.4 F (36.9 C)  Recent Labs  Lab 01/15/21 1855 01/16/21 0512 01/16/21 0821 01/17/21 0204 01/18/21 0136 01/19/21 0038  WBC 10.9*  --  8.2 6.6 6.4 5.8  CREATININE 6.02* 7.07*  --  8.84* 10.94* 8.52*    Estimated Creatinine Clearance: 10.2 mL/min (A) (by C-G formula based on SCr of 8.52 mg/dL (H)).    Allergies  Allergen Reactions  . Percocet [Oxycodone-Acetaminophen] Hives    Antimicrobials this admission: Remdesvir 1/22 >> 1/24 Vancomycin 1/22 >> Unasyn 1/22 >>  Microbiology results: 1/21 COVID >> positive 1/21 L-breast abscess >> few GPC  Jocee Kissick A. Levada Dy, PharmD, BCPS, FNKF Clinical Pharmacist [place] Please utilize Amion for appropriate phone number to reach the unit pharmacist ([place])

## 2021-01-20 ENCOUNTER — Other Ambulatory Visit: Payer: Self-pay

## 2021-01-20 DIAGNOSIS — N611 Abscess of the breast and nipple: Secondary | ICD-10-CM | POA: Diagnosis not present

## 2021-01-20 LAB — CBC WITH DIFFERENTIAL/PLATELET
Abs Immature Granulocytes: 0.04 10*3/uL (ref 0.00–0.07)
Basophils Absolute: 0 10*3/uL (ref 0.0–0.1)
Basophils Relative: 1 %
Eosinophils Absolute: 0.2 10*3/uL (ref 0.0–0.5)
Eosinophils Relative: 3 %
HCT: 25.2 % — ABNORMAL LOW (ref 36.0–46.0)
Hemoglobin: 7.9 g/dL — ABNORMAL LOW (ref 12.0–15.0)
Immature Granulocytes: 1 %
Lymphocytes Relative: 13 %
Lymphs Abs: 0.7 10*3/uL (ref 0.7–4.0)
MCH: 30.2 pg (ref 26.0–34.0)
MCHC: 31.3 g/dL (ref 30.0–36.0)
MCV: 96.2 fL (ref 80.0–100.0)
Monocytes Absolute: 0.5 10*3/uL (ref 0.1–1.0)
Monocytes Relative: 9 %
Neutro Abs: 4.3 10*3/uL (ref 1.7–7.7)
Neutrophils Relative %: 73 %
Platelets: 261 10*3/uL (ref 150–400)
RBC: 2.62 MIL/uL — ABNORMAL LOW (ref 3.87–5.11)
RDW: 16.1 % — ABNORMAL HIGH (ref 11.5–15.5)
WBC: 5.8 10*3/uL (ref 4.0–10.5)
nRBC: 0 % (ref 0.0–0.2)

## 2021-01-20 LAB — D-DIMER, QUANTITATIVE: D-Dimer, Quant: 2.56 ug/mL-FEU — ABNORMAL HIGH (ref 0.00–0.50)

## 2021-01-20 LAB — MAGNESIUM: Magnesium: 1.8 mg/dL (ref 1.7–2.4)

## 2021-01-20 LAB — COMPREHENSIVE METABOLIC PANEL
ALT: 15 U/L (ref 0–44)
AST: 23 U/L (ref 15–41)
Albumin: 2.4 g/dL — ABNORMAL LOW (ref 3.5–5.0)
Alkaline Phosphatase: 37 U/L — ABNORMAL LOW (ref 38–126)
Anion gap: 15 (ref 5–15)
BUN: 36 mg/dL — ABNORMAL HIGH (ref 6–20)
CO2: 29 mmol/L (ref 22–32)
Calcium: 9.1 mg/dL (ref 8.9–10.3)
Chloride: 90 mmol/L — ABNORMAL LOW (ref 98–111)
Creatinine, Ser: 10.17 mg/dL — ABNORMAL HIGH (ref 0.44–1.00)
GFR, Estimated: 4 mL/min — ABNORMAL LOW (ref >60)
Glucose, Bld: 88 mg/dL (ref 70–99)
Potassium: 4 mmol/L (ref 3.5–5.1)
Sodium: 134 mmol/L — ABNORMAL LOW (ref 135–145)
Total Bilirubin: 0.7 mg/dL (ref 0.3–1.2)
Total Protein: 6 g/dL — ABNORMAL LOW (ref 6.5–8.1)

## 2021-01-20 LAB — PHOSPHORUS: Phosphorus: 5.2 mg/dL — ABNORMAL HIGH (ref 2.5–4.6)

## 2021-01-20 LAB — C-REACTIVE PROTEIN: CRP: 5.9 mg/dL — ABNORMAL HIGH (ref <1.0)

## 2021-01-20 LAB — BRAIN NATRIURETIC PEPTIDE: B Natriuretic Peptide: 1383.7 pg/mL — ABNORMAL HIGH (ref 0.0–100.0)

## 2021-01-20 LAB — SURGICAL PATHOLOGY

## 2021-01-20 MED ORDER — ACETAMINOPHEN 325 MG PO TABS
ORAL_TABLET | ORAL | Status: AC
  Filled 2021-01-20: qty 2

## 2021-01-20 MED ORDER — VANCOMYCIN HCL IN DEXTROSE 1-5 GM/200ML-% IV SOLN
INTRAVENOUS | Status: AC
  Filled 2021-01-20: qty 200

## 2021-01-20 NOTE — Progress Notes (Signed)
[facility] Progress Note   Subjective:   Pt seen on HD. Reports she is feeling well, no concerns. Denies SOB, CP, palpitations, abdominal pain and nausea.   Objective Vitals:   01/20/21 0900 01/20/21 0930 01/20/21 1000 01/20/21 1030  BP: 100/66 93/87 (!) 95/36 106/71  Pulse: 78  75 76  Resp:      Temp:      TempSrc:      SpO2:      Weight:      Height:       Physical Exam  General: Well developed female, alert and in NAD Heart: RRR, no murmurs, rubs or gallops Lungs: CTA bilaterally without wheezing, rhonchi or rales Abdomen: Soft, non-tender, non-distended, +BS Extremities: No edema b/l lower extremities Dialysis Access: LUE AVF + bruit  Additional Objective Labs: Basic Metabolic Panel: Recent Labs  Lab 01/18/21 0136 01/19/21 0038 01/20/21 0020  NA 137 135 134*  K 3.9 3.3* 4.0  CL 92* 90* 90*  CO2 '30 30 29  '$ GLUCOSE 107* 87 88  BUN 40* 30* 36*  CREATININE 10.94* 8.52* 10.17*  CALCIUM 9.1 8.7* 9.1  PHOS  --   --  5.2*   Liver Function Tests: Recent Labs  Lab 01/18/21 0136 01/19/21 0038 01/20/21 0020  AST 11* 16 23  ALT '7 11 15  '$ ALKPHOS 35* 36* 37*  BILITOT 0.5 1.0 0.7  PROT 6.0* 6.0* 6.0*  ALBUMIN 2.2* 2.3* 2.4*   CBC: Recent Labs  Lab 01/16/21 0821 01/17/21 0204 01/18/21 0136 01/19/21 0038 01/20/21 0020  WBC 8.2 6.6 6.4 5.8 5.8  NEUTROABS  --  4.9 4.7 4.5 4.3  HGB 9.2* 8.4* 8.2* 8.0* 7.9*  HCT 29.5* 27.5* 27.6* 26.5* 25.2*  MCV 99.0 100.0 99.3 97.4 96.2  PLT 331 315 290 272 261   Blood Culture    Component Value Date/Time   SDES ABSCESS LEFT BREAST 01/16/2021 0009   SPECREQUEST NONE 01/16/2021 0009   CULT  01/16/2021 0009    HOLDING FOR POSSIBLE ANAEROBE Performed at [facility], [address] [place], [place] [address]    REPTSTATUS PENDING 01/16/2021 0009   Medications: . sodium chloride    . ampicillin-sulbactam (UNASYN) IV Stopped (01/19/21 2316)  . vancomycin    . vancomycin 1,000 mg (01/20/21 0930)    . carvedilol  25 mg Oral BID WC  . Chlorhexidine Gluconate Cloth  6 each Topical Q0600  . Chlorhexidine Gluconate Cloth  6 each Topical Q0600  . cloNIDine  0.3 mg Oral TID  . heparin  5,000 Units Subcutaneous Q8H  . lidocaine-prilocaine  1 application Topical Q M,W,F-HD  . multivitamin  1 tablet Oral QHS  . senna  1 tablet Oral BID  . sevelamer carbonate  3,200 mg Oral TID WC  . sodium chloride flush  3 mL Intravenous Q12H    Dialysis Orders:  MWF  4h 65mn 2/2.5 bath 105kg AVF Heparin ? parsabiv 15 mg three times a week with HD mircera 150 mcg every 2 weeks - last given 01/13/21 hectorol 3 mcg three times a week   Assessment/Plan: #End-stage renal disease - Tolerating dialysis well - HD per MWF schedule. Next HD Friday.   # Left breast abscess - S/p I&D by general surgery. Antibiotics per primary team - Please consider alternative to morphine in ESRD pt as active metabolites can accumulate  # covid 19 infection  - Therapies per primary team. Off covid isolation s/p two negative tests - Can return to her regular  outpatient HD seat at discharge  # Acute hypoxic resp failure  - supportive care including oxygen  - optimize volume status as able with HD  - covid management as above -syptoms improved  # HTN  - acceptable control  # Anemia CKD -Hgb 7.9. -ESArecently given, not due for next dose yet. - No IV Fe given acute infection - Transfuse PRN  # Metabolic bone disease - Phos at goal.  - Corrected calcium slightly high, hectorol on hold - not able to get parsabiv here    Anice Paganini, PA-C 01/20/2021, 10:40 AM  [facility] Pager: [phone]

## 2021-01-20 NOTE — Plan of Care (Signed)
  Problem: Nutrition: Goal: Adequate nutrition will be maintained Outcome: Progressing   Problem: Coping: Goal: Level of anxiety will decrease Outcome: Progressing   

## 2021-01-20 NOTE — Progress Notes (Signed)
PROGRESS NOTE                                                                                                                                                                                                             Patient Demographics:    Rebekah Burton, is a 45 y.o. female, DOB - 02-21-1976, [id number]  Outpatient Primary MD for the patient is Trey Sailors, [place]    LOS - 5  Admit date - 01/15/2021    Chief Complaint  Patient presents with  . Abscess       Brief Narrative (HPI from H&P)   Rebekah Burton is a 45 y.o. female with medical history significant of ESRD-HD M-W-F, blindness, HTN, PAD had developed a breast abscess and was seen in ED 01/04/21 where U/S confirmed abscess, she was admitted for left breast abscess incision and drainage and was found to have incidental COVID infection.   Subjective:   Patient seen and examined.  She complains of some pain in the left breast which is not new for her.  No new complaint.   Assessment  & Plan :     Acute Covid 19 Infection ( incidental)  - she has no evidence of pulmonary involvement or COVID-specific symptoms, monitor.  CRP is elevated due to left breast abscess.  She had one positive and one negative test in the last few days, repeat 3 rd test -ve. DC Isolation. Removed quarantine 01/19/21.  Encouraged the patient to sit up in chair in the daytime use I-S and flutter valve for pulmonary toiletry and then prone in bed when at night.  Will advance activity and titrate down oxygen as possible.   Recent Labs  Lab 01/15/21 1753 01/15/21 1755 01/15/21 1855 01/16/21 0512 01/16/21 0821 01/17/21 0204 01/17/21 0205 01/17/21 1145 01/18/21 0136 01/18/21 0913 01/19/21 0030 01/19/21 0038 01/20/21 0020  WBC  --   --    < >  --  8.2 6.6  --   --  6.4  --   --  5.8 5.8  HGB  --   --    < >  --  9.2* 8.4*  --   --  8.2*  --   --  8.0* 7.9*  HCT  --   --    < >  --   29.5* 27.5*  --   --  27.6*  --   --  26.5* 25.2*  PLT  --   --    < >  --  331 315  --   --  290  --   --  272 261  CRP  --   --    < > 17.7*  --  14.1*  --   --  10.1*  --   --  7.3* 5.9*  BNP  --   --   --  707.3*  --   --  1,072.2*  --  1,410.7*  --  1,122.4*  --  1,383.7*  DDIMER  --   --   --  2.11*  --  2.24*  --   --  2.50*  --   --  3.11* 2.56*  PROCALCITON  --   --   --  1.62  --   --   --  1.46 1.22  --   --  1.27  --   AST  --   --   --  15  --  11*  --   --  11*  --   --  16 23  ALT  --   --   --  12  --  6  --   --  7  --   --  11 15  ALKPHOS  --   --   --  41  --  35*  --   --  35*  --   --  36* 37*  BILITOT  --   --   --  0.7  --  0.7  --   --  0.5  --   --  1.0 0.7  ALBUMIN  --   --   --  2.5*  --  2.2*  --   --  2.2*  --   --  2.3* 2.4*  SARSCOV2NAA POSITIVE* NEGATIVE  --   --   --   --   --   --   --  NEGATIVE  --   --   --    < > = values in this interval not displayed.    2. Left breast abscess.  I&D done by general surgery, on Vancomycin, Unasyn added. Follow cultures, prelim growing gram-positive cocci, MRSA nasal PCR negative, continue Unasyn and Comycin.   3. ESRD.  MWF schedule, Renal following.  4. AOCD - monitor.  5.HTN -controlled on home meds.        Condition - Fair  Family Communication  Joanell Rising on [phone] 01/18/2021 at 9:17 AM  Code Status :  Full  Consults  :  CCS, Renal  Procedures  :    CT - : 1. There is extensive subcutaneous fat stranding and skin thickening of the left breast. There is a internally hypoattenuating mass or fluid collection in the subareolar left breast measuring 7.3 x 7.0 x 4.8 cm. Enlarged left axillary and subpectoral lymph nodes. This may reflect a benign breast abscess depending upon clinical presentation, but locally advanced malignancy with involvement of the skin and lymph nodes would be difficult to confidently distinguish based on CT. Recommend initial targeted ultrasound to assess for drainable fluid  collection. 2. The central superior vena cava is narrowed and effaced with intraluminal calcifications and extensive superficial vascular collateralization about the chest wall, generally suggestive chronic central venous stenosis secondary to prior indwelling catheter access. 3. Coronary artery disease and aortic atherosclerosis, markedly advanced for patient age. Aortic Atherosclerosis.  L.Breast I&D by CCS on 01/15/21   PUD Prophylaxis : None  Disposition Plan  :    Status is: Inpatient  Remains inpatient appropriate because:Unsafe d/c plan   Dispo: The patient is from: Home              Anticipated d/c is to: Home              Anticipated d/c date is: 2 days              Patient currently is medically stable to d/c.   Difficult to place patient Yes  DVT Prophylaxis  :   Heparin   Lab Results  Component Value Date   PLT 261 01/20/2021    Diet :  Diet Order            Diet renal with fluid restriction Fluid restriction: 1200 mL Fluid; Room service appropriate? Yes; Fluid consistency: Thin  Diet effective now                  Inpatient Medications  Scheduled Meds: . carvedilol  25 mg Oral BID WC  . Chlorhexidine Gluconate Cloth  6 each Topical Q0600  . Chlorhexidine Gluconate Cloth  6 each Topical Q0600  . cloNIDine  0.3 mg Oral TID  . heparin  5,000 Units Subcutaneous Q8H  . lidocaine-prilocaine  1 application Topical Q M,W,F-HD  . multivitamin  1 tablet Oral QHS  . senna  1 tablet Oral BID  . sevelamer carbonate  3,200 mg Oral TID WC  . sodium chloride flush  3 mL Intravenous Q12H   Continuous Infusions: . sodium chloride    . ampicillin-sulbactam (UNASYN) IV Stopped (01/19/21 2316)  . vancomycin    . vancomycin Stopped (01/20/21 1036)   PRN Meds:.sodium chloride, acetaminophen **OR** [DISCONTINUED] acetaminophen, HYDROcodone-acetaminophen, sodium chloride flush  Antibiotics  :    Anti-infectives (From admission, onward)   Start     Dose/Rate Route  Frequency Ordered Stop   01/20/21 0845  vancomycin (VANCOCIN) 1-5 GM/200ML-% IVPB       Note to Pharmacy: Judieth Keens  : cabinet override      01/20/21 0845 01/20/21 2059   01/18/21 1200  vancomycin (VANCOCIN) IVPB 1000 mg/200 mL premix        1,000 mg 200 mL/hr over 60 Minutes Intravenous Every M-W-F (Hemodialysis) 01/16/21 1017     01/17/21 2200  Ampicillin-Sulbactam (UNASYN) 3 g in sodium chloride 0.9 % 100 mL IVPB        3 g 200 mL/hr over 30 Minutes Intravenous Daily at bedtime 01/16/21 1058     01/17/21 1000  remdesivir 100 mg in sodium chloride 0.9 % 100 mL IVPB       "Followed by" Linked Group Details   100 mg 200 mL/hr over 30 Minutes Intravenous Daily 01/15/21 2357 01/18/21 0918   01/16/21 1145  Ampicillin-Sulbactam (UNASYN) 3 g in sodium chloride 0.9 % 100 mL IVPB        3 g 200 mL/hr over 30 Minutes Intravenous  Once 01/16/21 1058 01/16/21 1417   01/16/21 1100  vancomycin (VANCOREADY) IVPB 1500 mg/300 mL        1,500 mg 150 mL/hr over 120 Minutes Intravenous  Once 01/16/21 1017 01/16/21 1849   01/16/21 0300  remdesivir 200 mg in sodium chloride 0.9% 250 mL IVPB       "Followed by" Linked Group Details   200 mg 580 mL/hr over 30 Minutes Intravenous Once 01/15/21 2357 01/16/21 0543  01/15/21 2345  amoxicillin-clavulanate (AUGMENTIN) 875-125 MG per tablet 1 tablet  Status:  Discontinued        1 tablet Oral Every 12 hours 01/15/21 2334 01/15/21 2357   01/15/21 1800  vancomycin (VANCOCIN) IVPB 1000 mg/200 mL premix        1,000 mg 200 mL/hr over 60 Minutes Intravenous  Once 01/15/21 1753 01/15/21 2120       Time Spent in minutes  30   Darliss Cheney M.D on 01/20/2021 at 2:05 PM  To page go to [email or web address]   [facility] -  Office  [phone]    See all Orders from today for further details    Objective:   Vitals:   01/20/21 1000 01/20/21 1030 01/20/21 1041 01/20/21 1131  BP: (!) 95/36 106/71 105/72 140/83  Pulse: 75 76 76 81  Resp:   18 18   Temp:   98.1 F (36.7 C) 98.7 F (37.1 C)  TempSrc:   Oral Oral  SpO2:   97% 100%  Weight:   105.9 kg   Height:        Wt Readings from Last 3 Encounters:  01/20/21 105.9 kg  10/23/20 109.3 kg  08/18/20 103.4 kg     Intake/Output Summary (Last 24 hours) at 01/20/2021 1405 Last data filed at 01/20/2021 1041 Gross per 24 hour  Intake 699.08 ml  Output 2500 ml  Net -1800.92 ml     Physical Exam  General exam: Appears calm and comfortable  Respiratory system: Clear to auscultation. Respiratory effort normal. Cardiovascular system: S1 & S2 heard, RRR. No JVD, murmurs, rubs, gallops or clicks. No pedal edema. Gastrointestinal system: Abdomen is nondistended, soft and nontender. No organomegaly or masses felt. Normal bowel sounds heard. Central nervous system: Alert and oriented. No focal neurological deficits. Extremities: Symmetric 5 x 5 power. Skin: No rashes, lesions or ulcers.  Psychiatry: Judgement and insight appear normal. Mood & affect appropriate.      Data Review:    CBC Recent Labs  Lab 01/15/21 1855 01/16/21 0821 01/17/21 0204 01/18/21 0136 01/19/21 0038 01/20/21 0020  WBC 10.9* 8.2 6.6 6.4 5.8 5.8  HGB 9.6* 9.2* 8.4* 8.2* 8.0* 7.9*  HCT 30.0* 29.5* 27.5* 27.6* 26.5* 25.2*  PLT 347 331 315 290 272 261  MCV 97.4 99.0 100.0 99.3 97.4 96.2  MCH 31.2 30.9 30.5 29.5 29.4 30.2  MCHC 32.0 31.2 30.5 29.7* 30.2 31.3  RDW 16.3* 16.4* 16.4* 16.0* 16.3* 16.1*  LYMPHSABS 0.8  --  1.0 0.9 0.8 0.7  MONOABS 0.9  --  0.4 0.4 0.4 0.5  EOSABS 0.1  --  0.3 0.3 0.1 0.2  BASOSABS 0.0  --  0.0 0.0 0.0 0.0    Recent Labs  Lab 01/16/21 0512 01/17/21 0204 01/17/21 0205 01/17/21 1145 01/18/21 0136 01/19/21 0030 01/19/21 0038 01/20/21 0020  NA 141 138  --   --  137  --  135 134*  K 3.8 3.7  --   --  3.9  --  3.3* 4.0  CL 94* 94*  --   --  92*  --  90* 90*  CO2 32 29  --   --  30  --  30 29  GLUCOSE 90 134*  --   --  107*  --  87 88  BUN 20 29*  --   --  40*   --  30* 36*  CREATININE 7.07* 8.84*  --   --  10.94*  --  8.52* 10.17*  CALCIUM  9.0 8.9  --   --  9.1  --  8.7* 9.1  AST 15 11*  --   --  11*  --  16 23  ALT 12 6  --   --  7  --  11 15  ALKPHOS 41 35*  --   --  35*  --  36* 37*  BILITOT 0.7 0.7  --   --  0.5  --  1.0 0.7  ALBUMIN 2.5* 2.2*  --   --  2.2*  --  2.3* 2.4*  MG 2.0 2.0  --   --  2.0  --  1.8 1.8  CRP 17.7* 14.1*  --   --  10.1*  --  7.3* 5.9*  DDIMER 2.11* 2.24*  --   --  2.50*  --  3.11* 2.56*  PROCALCITON 1.62  --   --  1.46 1.22  --  1.27  --   BNP 707.3*  --  1,072.2*  --  1,410.7* 1,122.4*  --  1,383.7*    ------------------------------------------------------------------------------------------------------------------ No results for input(s): CHOL, HDL, LDLCALC, TRIG, CHOLHDL, LDLDIRECT in the last 72 hours.  Lab Results  Component Value Date   HGBA1C 5.2 01/16/2019   ------------------------------------------------------------------------------------------------------------------ No results for input(s): TSH, T4TOTAL, T3FREE, THYROIDAB in the last 72 hours.  Invalid input(s): FREET3  Cardiac Enzymes No results for input(s): CKMB, TROPONINI, MYOGLOBIN in the last 168 hours.  Invalid input(s): CK ------------------------------------------------------------------------------------------------------------------    Component Value Date/Time   BNP 1,383.7 (H) 01/20/2021 0020    Micro Results Recent Results (from the past 240 hour(s))  SARS Coronavirus 2 by RT PCR (hospital order, performed in [facility] hospital lab) Nasopharyngeal Nasopharyngeal Swab     Status: Abnormal   Collection Time: 01/15/21  5:53 PM   Specimen: Nasopharyngeal Swab  Result Value Ref Range Status   SARS Coronavirus 2 POSITIVE (A) NEGATIVE Final    Comment: RESULT CALLED TO, READ BACK BY AND VERIFIED WITH: Eustaquio Boyden RN 01/15/21 2209 JDW (NOTE) SARS-CoV-2 target nucleic acids are DETECTED  SARS-CoV-2 RNA is generally  detectable in upper respiratory specimens  during the acute phase of infection.  Positive results are indicative  of the presence of the identified virus, but do not rule out bacterial infection or co-infection with other pathogens not detected by the test.  Clinical correlation with patient history and  other diagnostic information is necessary to determine patient infection status.  The expected result is negative.  Fact Sheet for Patients:   StrictlyIdeas.no   Fact Sheet for Healthcare Providers:   BankingDealers.co.za    This test is not yet approved or cleared by the [place] FDA and  has been authorized for detection and/or diagnosis of SARS-CoV-2 by FDA under an Emergency Use Authorization (EUA).  This EUA will remain in effect (meaning this  test can be used) for the duration of  the COVID-19 declaration under Section 564(b)(1) of the Act, 21 U.S.C. section 360-bbb-3(b)(1), unless the authorization is terminated or revoked sooner.  Performed at [facility], [place] [address], [place], [place] [address]   SARS CORONAVIRUS 2 (TAT 6-24 HRS) Nasopharyngeal Nasopharyngeal Swab     Status: None   Collection Time: 01/15/21  5:55 PM   Specimen: Nasopharyngeal Swab  Result Value Ref Range Status   SARS Coronavirus 2 NEGATIVE NEGATIVE Final    Comment: (NOTE) SARS-CoV-2 target nucleic acids are NOT DETECTED.  The SARS-CoV-2 RNA is generally detectable in upper and lower respiratory specimens during  the acute phase of infection. Negative results do not preclude SARS-CoV-2 infection, do not rule out co-infections with other pathogens, and should not be used as the sole basis for treatment or other patient management decisions. Negative results must be combined with clinical observations, patient history, and epidemiological information. The expected result is Negative.  Fact Sheet for  Patients: SugarRoll.be  Fact Sheet for Healthcare Providers: https://www.woods-mathews.com/  This test is not yet approved or cleared by the [place] FDA and  has been authorized for detection and/or diagnosis of SARS-CoV-2 by FDA under an Emergency Use Authorization (EUA). This EUA will remain  in effect (meaning this test can be used) for the duration of the COVID-19 declaration under Se ction 564(b)(1) of the Act, 21 U.S.C. section 360bbb-3(b)(1), unless the authorization is terminated or revoked sooner.  Performed at [facility], [place] [address] [place], [place] [address]   Aerobic/Anaerobic Culture (surgical/deep wound)     Status: None (Preliminary result)   Collection Time: 01/16/21 12:09 AM   Specimen: Abscess  Result Value Ref Range Status   Specimen Description ABSCESS LEFT BREAST  Final   Special Requests NONE  Final   Gram Stain   Final    NO WBC SEEN FEW GRAM POSITIVE COCCI Performed at [facility], [address], [place], [place] [address]    Culture   Final    MIXED ANAEROBIC FLORA PRESENT.  CALL LAB IF FURTHER IID REQUIRED.   Report Status PENDING  Incomplete  SARS CORONAVIRUS 2 (TAT 6-24 HRS) Nasopharyngeal Nasopharyngeal Swab     Status: None   Collection Time: 01/18/21  9:13 AM   Specimen: Nasopharyngeal Swab  Result Value Ref Range Status   SARS Coronavirus 2 NEGATIVE NEGATIVE Final    Comment: (NOTE) SARS-CoV-2 target nucleic acids are NOT DETECTED.  The SARS-CoV-2 RNA is generally detectable in upper and lower respiratory specimens during the acute phase of infection. Negative results do not preclude SARS-CoV-2 infection, do not rule out co-infections with other pathogens, and should not be used as the sole basis for treatment or other patient management decisions. Negative results must be combined with clinical observations, patient history, and epidemiological information. The  expected result is Negative.  Fact Sheet for Patients: SugarRoll.be  Fact Sheet for Healthcare Providers: https://www.woods-mathews.com/  This test is not yet approved or cleared by the [place] FDA and  has been authorized for detection and/or diagnosis of SARS-CoV-2 by FDA under an Emergency Use Authorization (EUA). This EUA will remain  in effect (meaning this test can be used) for the duration of the COVID-19 declaration under Se ction 564(b)(1) of the Act, 21 U.S.C. section 360bbb-3(b)(1), unless the authorization is terminated or revoked sooner.  Performed at [facility], [place] [address] [place], [place] [address]   MRSA PCR Screening     Status: None   Collection Time: 01/19/21  2:16 PM   Specimen: Nasal Mucosa; Nasopharyngeal  Result Value Ref Range Status   MRSA by PCR NEGATIVE NEGATIVE Final    Comment:        The GeneXpert MRSA Assay (FDA approved for NASAL specimens only), is one component of a comprehensive MRSA colonization surveillance program. It is not intended to diagnose MRSA infection nor to guide or monitor treatment for MRSA infections. Performed at [facility], [place] [address] [place], [place] [address]     Radiology Reports CT Chest W Contrast  Result Date: 01/05/2021 CLINICAL DATA:  Soft  tissue mass, breast mass versus infection EXAM: CT CHEST WITH CONTRAST TECHNIQUE: Multidetector CT imaging of the chest was performed during intravenous contrast administration. CONTRAST:  130m OMNIPAQUE IOHEXOL 300 MG/ML  SOLN COMPARISON:  None. FINDINGS: Cardiovascular: Aortic atherosclerosis. Normal heart size. Three-vessel coronary artery calcifications and/or stents. No pericardial effusion. The central superior vena cava is narrowed and effaced with intraluminal calcifications and extensive superficial vascular collateralization about the chest wall (series 3, image 43, series 6, image 75).  Mediastinum/Nodes: No enlarged mediastinal or hilar lymph nodes. Enlarged left axillary and subpectoral lymph nodes measuring up to 2.0 x 1.2 cm (series 3, image 25). Thyroid gland, trachea, and esophagus demonstrate no significant findings. Lungs/Pleura: Lungs are clear. No pleural effusion or pneumothorax. Upper Abdomen: No acute abnormality. Atrophic appearance of the partially included kidneys in the upper abdomen. Musculoskeletal: There is extensive subcutaneous fat stranding and skin thickening of the left breast. There is a internally hypoattenuating mass or fluid collection in the subareolar left breast measuring 7.3 x 7.0 x 4.8 cm (series 3, image 69, series 6, image 37). IMPRESSION: 1. There is extensive subcutaneous fat stranding and skin thickening of the left breast. There is a internally hypoattenuating mass or fluid collection in the subareolar left breast measuring 7.3 x 7.0 x 4.8 cm. Enlarged left axillary and subpectoral lymph nodes. This may reflect a benign breast abscess depending upon clinical presentation, but locally advanced malignancy with involvement of the skin and lymph nodes would be difficult to confidently distinguish based on CT. Recommend initial targeted ultrasound to assess for drainable fluid collection. 2. The central superior vena cava is narrowed and effaced with intraluminal calcifications and extensive superficial vascular collateralization about the chest wall, generally suggestive chronic central venous stenosis secondary to prior indwelling catheter access. 3. Coronary artery disease and aortic atherosclerosis, markedly advanced for patient age. Aortic Atherosclerosis (ICD10-I70.0). Electronically Signed   By: AEddie CandleM.D.   On: 01/05/2021 14:00   Portable chest 1 View  Result Date: 01/16/2021 CLINICAL DATA:  COVID-19. EXAM: PORTABLE CHEST 1 VIEW COMPARISON:  Chest CT 01/05/2021 FINDINGS: Cardiac contours upper limits of normal. No large area pulmonary  consolidation. Minimal left basilar atelectasis. No pleural effusion or pneumothorax. Osseous structures unremarkable. IMPRESSION: No acute cardiopulmonary process. Electronically Signed   By: DLovey NewcomerM.D.   On: 01/16/2021 08:13   UK[place]BREAST COMPLETE UNI LEFT INC AXILLA  Result Date: 01/05/2021 CLINICAL DATA:  Follow-up abnormal CT chest. EXAM: ULTRASOUND OF THE LEFT BREAST COMPARISON:  CT 01/05/2021. FINDINGS: Left breast ultrasound reveals a 6.2 x 4.5 x 7.2 cm complex cystic mass. Although tumor cannot be excluded, abscess and or hematoma would be more likely. Close follow-up exam suggested to demonstrate complete resolution in order to exclude a tumor. IMPRESSION: Left breast soft tissue edema. Left breast complex cystic mass. Although tumor cannot be excluded, abscess and or hematoma would be more likely. Aspiration of the left breast cystic mass should be considered. RECOMMENDATION: Aspiration of the left breast cystic mass should be considered. Close follow-up exams suggested to demonstrate complete resolution in order to exclude a tumor. I BI-RADS CATEGORY  BI-RADS category 4. Electronically Signed   By: TMarcello Moores Register   On: 01/05/2021 15:57

## 2021-01-21 DIAGNOSIS — N611 Abscess of the breast and nipple: Secondary | ICD-10-CM | POA: Diagnosis not present

## 2021-01-21 LAB — AEROBIC/ANAEROBIC CULTURE W GRAM STAIN (SURGICAL/DEEP WOUND): Gram Stain: NONE SEEN

## 2021-01-21 MED ORDER — CHLORHEXIDINE GLUCONATE CLOTH 2 % EX PADS
6.0000 | MEDICATED_PAD | Freq: Every day | CUTANEOUS | Status: DC
  Administered 2021-01-21 – 2021-01-25 (×3): 6 via TOPICAL

## 2021-01-21 NOTE — TOC Progression Note (Signed)
Transition of Care [facility]) - Progression Note    Patient Details  Name: Rebekah Burton MRN: [id number] Date of Birth: [birth year]  Transition of Care [facility]) CM/SW Contact  Bartholomew Crews, RN Phone Number: [phone] 01/21/2021, 2:17 PM  Clinical Narrative:     NOtified by Alvis Lemmings that [facility] office cannot accept referrral for [facility] RN d/t staffing. Will continue search.        Expected Discharge Plan and Services                                                 Social Determinants of Health (SDOH) Interventions    Readmission Risk Interventions No flowsheet data found.

## 2021-01-21 NOTE — TOC Progression Note (Signed)
Transition of Care [facility]) - Progression Note    Patient Details  Name: Rebekah Burton MRN: [id number] Date of Birth: 1976-05-16  Transition of Care [facility]) CM/SW Contact  Bartholomew Crews, RN Phone Number: [phone] 01/21/2021, 12:16 PM  Clinical Narrative:     NCM spoke with Medicaid transportation. [place] to provide her transportation with her living in [place] and having dialysis in [place]. Process initiated to transfer patient's Medicaid to [facility] with patient's permission. Transfer takes 24-48 business hours to process. Once transfer complete Medicaid transportation can be arranged with [place].   Spoke with Colleen/renal navigator about patient needs for dialysis center in [place]. Transfer process to be initiated. Discussed with patient at the bedside - patient aware that she may not have the same schedule or seat time, but she can request to be put on a waiting list for preferred schedule if needed.   Discussed significant other needing teaching concerning dressing changes. He does not drive, but if he is able to come to hospital, then nursing staff can provide teaching for dressing change.   [facility] notified of discharge address. Pending follow up to ensure they can meet patient's needs.   TOC following for transition needs.          Expected Discharge Plan and Services                                                 Social Determinants of Health (SDOH) Interventions    Readmission Risk Interventions No flowsheet data found.

## 2021-01-21 NOTE — TOC Progression Note (Addendum)
Transition of Care [facility]) - Progression Note    Patient Details  Name: Rebekah Burton MRN: [id number] Date of Birth: 07-02-1976  Transition of Care [facility]) CM/SW Contact  Bartholomew Crews, RN Phone Number: [phone] 01/21/2021, 11:17 AM  Clinical Narrative:     Spoke with patient at the bedside. Her plan to transition to her significant other's home. Verbal consent to speak with significant other, Morene Antu, [phone]. Voicemail left requesting call back. Discharge address is [address], [place], [place] [address].   Discussed her outpatient dialysis needs. Currently she attends [facility] on MWF at 6:00 am. She stated that Medicaid transportation ([place]) picks her at 5:30 am to transport her to dialysis. Discussed that not all transportation services will cross county lines. Patient has used [facility] in the past, but stated that they were always late, so she would be later getting out of dialysis. Attempting to contact [employer] transportation to discuss dialysis transportation - long wait times at present.   [facility] arranged for [facility] RN to assist with dressing chages. Discussed that Stuart does not come every day and that she will need a caregiver to assist. She stated that Jeneen Rinks can assist although he has expressed concerns about wanting to be sure that he was doing the dressing change correctly. Discussed that [facility] RN would monitor her dressing changes during visits and provide any education.   Patient will need transportation assist to get home at discharge.   TOC following for transition needs.        Expected Discharge Plan and Services                                                 Social Determinants of Health (SDOH) Interventions    Readmission Risk Interventions No flowsheet data found.

## 2021-01-21 NOTE — Plan of Care (Signed)
  Problem: Coping: Goal: Level of anxiety will decrease Outcome: Progressing   Problem: Activity: Goal: Risk for activity intolerance will decrease Outcome: Progressing   

## 2021-01-21 NOTE — Progress Notes (Signed)
[facility] Progress Note   Subjective:   Stable breast pain, otherwise no concerns. Denies SOB, CP, palpitations, dizziness. Feels HD is going very well here. Thinks she has a new place to live but will need to change dialysis centers due to the location- renal navigator notified.   Objective Vitals:   01/20/21 1537 01/20/21 2020 01/21/21 0439 01/21/21 1007  BP: 110/68 117/65 130/84 121/65  Pulse: 73 75 79 68  Resp: '16 16 18 18  '$ Temp: 98.8 F (37.1 C) 98.7 F (37.1 C) 97.6 F (36.4 C) 98 F (36.7 C)  TempSrc: Oral Oral Oral Oral  SpO2: 98% 100% 96% 99%  Weight:      Height:       Physical Exam General:Well developed female, alert and in NAD Heart:RRR, no murmurs, rubs or gallops Lungs:CTA bilaterally without wheezing, rhonchi or rales Abdomen:Soft, non-tender, non-distended, +BS Extremities:No edema b/l lower extremities Dialysis Access:LUE AVF + bruit  Additional Objective Labs: Basic Metabolic Panel: Recent Labs  Lab 01/18/21 0136 01/19/21 0038 01/20/21 0020  NA 137 135 134*  K 3.9 3.3* 4.0  CL 92* 90* 90*  CO2 '30 30 29  '$ GLUCOSE 107* 87 88  BUN 40* 30* 36*  CREATININE 10.94* 8.52* 10.17*  CALCIUM 9.1 8.7* 9.1  PHOS  --   --  5.2*   Liver Function Tests: Recent Labs  Lab 01/18/21 0136 01/19/21 0038 01/20/21 0020  AST 11* 16 23  ALT '7 11 15  '$ ALKPHOS 35* 36* 37*  BILITOT 0.5 1.0 0.7  PROT 6.0* 6.0* 6.0*  ALBUMIN 2.2* 2.3* 2.4*   CBC: Recent Labs  Lab 01/16/21 0821 01/17/21 0204 01/18/21 0136 01/19/21 0038 01/20/21 0020  WBC 8.2 6.6 6.4 5.8 5.8  NEUTROABS  --  4.9 4.7 4.5 4.3  HGB 9.2* 8.4* 8.2* 8.0* 7.9*  HCT 29.5* 27.5* 27.6* 26.5* 25.2*  MCV 99.0 100.0 99.3 97.4 96.2  PLT 331 315 290 272 261   Blood Culture    Component Value Date/Time   SDES ABSCESS LEFT BREAST 01/16/2021 0009   SPECREQUEST NONE 01/16/2021 0009   CULT  01/16/2021 0009    MIXED ANAEROBIC FLORA PRESENT.  CALL LAB IF FURTHER IID REQUIRED.    REPTSTATUS PENDING 01/16/2021 0009   Medications: . sodium chloride    . ampicillin-sulbactam (UNASYN) IV 3 g (01/20/21 2151)  . vancomycin Stopped (01/20/21 1036)   . carvedilol  25 mg Oral BID WC  . Chlorhexidine Gluconate Cloth  6 each Topical Q0600  . Chlorhexidine Gluconate Cloth  6 each Topical Q0600  . cloNIDine  0.3 mg Oral TID  . heparin  5,000 Units Subcutaneous Q8H  . lidocaine-prilocaine  1 application Topical Q M,W,F-HD  . multivitamin  1 tablet Oral QHS  . senna  1 tablet Oral BID  . sevelamer carbonate  3,200 mg Oral TID WC  . sodium chloride flush  3 mL Intravenous Q12H    Dialysis Orders: MWF  4h 61mn 2/2.5 bath 105kg AVF Heparin ? parsabiv 15 mg three times a week with HD mircera 150 mcg every 2 weeks - last given 01/13/21 hectorol 3 mcg three times a week  Assessment/Plan: #End-stage renal disease - Tolerating dialysis well - HD per MWF schedule.Next HD Friday.  # Left breast abscess -S/p I&D by general surgery.Antibiotics per primary team -off of morphine now, please avoid use in ESRD pts  # covid 19 infection  - Therapies per primary team. Off covid isolation s/p two negative tests - Can return  to her regular outpatient HD seat at discharge  # Acute hypoxic resp failure  - supportive care including oxygen  - optimize volume status as able with HD  - covid management as above -symptoms resolved  # HTN  - acceptable control - much better control here than outpatient and pt agrees she feels better with normal BP. Emphasized the importance of med compliance at discharge  # Anemia CKD -Hgb 7.9. -ESArecently given, not due for next dose yet. - No IV Fe given acute infection - Transfuse PRN  # Metabolic bone disease - Phos at goal. - Corrected calcium slightly high, hectorol on hold - not able to get parsabiv here    Anice Paganini, PA-C 01/21/2021, 10:34 AM  [facility] Pager: [phone]

## 2021-01-21 NOTE — Progress Notes (Signed)
PROGRESS NOTE                                                                                                                                                                                                             Patient Demographics:    Rebekah Burton, is a 45 y.o. female, DOB - 12/23/1976, [id number]  Outpatient Primary MD for the patient is Trey Sailors, [place]    LOS - 6  Admit date - 01/15/2021    Chief Complaint  Patient presents with  . Abscess       Brief Narrative (HPI from H&P)   Rebekah Burton is a 45 y.o. female with medical history significant of ESRD-HD M-W-F, blindness, HTN, PAD had developed a breast abscess and was seen in ED 01/04/21 where U/S confirmed abscess, she was admitted for left breast abscess incision and drainage and was found to have incidental COVID infection.   Subjective:   Seen and examined.  No new complaint except mild left breast pain.  She tells me that finally her boyfriend has agreed for her to come home however he lives in [place].  He does not have a ride either.  So he will not be available at home until later today however since she will be moving from [facility] to [place] now so her outpatient dialysis center has to be transferred to [facility].  I have been informed by TOC and renal navigator that the process has been initiated however it takes 1 to 2 days for the process to be completed.  Patient will be discharged once this is done.   Assessment  & Plan :     Acute Covid 19 Infection ( incidental)  - she has no evidence of pulmonary involvement or COVID-specific symptoms, monitor.  CRP is elevated due to left breast abscess.  She had one positive and one negative test in the last few days, repeat 3 rd test -ve. DC Isolation. Removed quarantine 01/19/21.  Encouraged the patient to sit up in chair in the daytime use I-S and flutter valve for pulmonary toiletry  and then prone in bed when at night.  Will advance activity and titrate down oxygen as possible.   Recent Labs  Lab 01/15/21 1753 01/15/21 1755 01/15/21 1855 01/16/21 [id number] 01/16/21 [id number] 01/17/21 [id number] 01/17/21 0205 01/17/21 1145 01/18/21 0136 01/18/21 0913 01/19/21  0030 01/19/21 0038 01/20/21 0020  WBC  --   --    < >  --  8.2 6.6  --   --  6.4  --   --  5.8 5.8  HGB  --   --    < >  --  9.2* 8.4*  --   --  8.2*  --   --  8.0* 7.9*  HCT  --   --    < >  --  29.5* 27.5*  --   --  27.6*  --   --  26.5* 25.2*  PLT  --   --    < >  --  331 315  --   --  290  --   --  272 261  CRP  --   --    < > 17.7*  --  14.1*  --   --  10.1*  --   --  7.3* 5.9*  BNP  --   --   --  707.3*  --   --  1,072.2*  --  1,410.7*  --  1,122.4*  --  1,383.7*  DDIMER  --   --   --  2.11*  --  2.24*  --   --  2.50*  --   --  3.11* 2.56*  PROCALCITON  --   --   --  1.62  --   --   --  1.46 1.22  --   --  1.27  --   AST  --   --   --  15  --  11*  --   --  11*  --   --  16 23  ALT  --   --   --  12  --  6  --   --  7  --   --  11 15  ALKPHOS  --   --   --  41  --  35*  --   --  35*  --   --  36* 37*  BILITOT  --   --   --  0.7  --  0.7  --   --  0.5  --   --  1.0 0.7  ALBUMIN  --   --   --  2.5*  --  2.2*  --   --  2.2*  --   --  2.3* 2.4*  SARSCOV2NAA POSITIVE* NEGATIVE  --   --   --   --   --   --   --  NEGATIVE  --   --   --    < > = values in this interval not displayed.    2. Left breast abscess.  I&D done by general surgery, on Vancomycin, Unasyn added. Follow cultures, prelim growing gram-positive cocci, MRSA nasal PCR negative, continue Unasyn.  MRSA PCR is negative so I doubt any MRSA so I will discontinue vancomycin.   3. ESRD.  MWF schedule, Renal following.  Further information as mentioned in subjective above.  4. AOCD - monitor.  5.HTN -controlled on home meds.        Condition - Fair  Family Communication  Joanell Rising on [phone] 01/18/2021 at 9:17 AM  Code Status :   Full  Consults  :  CCS, Renal  Procedures  :    CT - : 1. There is extensive subcutaneous fat stranding and skin thickening of the left breast. There is a internally hypoattenuating mass or  fluid collection in the subareolar left breast measuring 7.3 x 7.0 x 4.8 cm. Enlarged left axillary and subpectoral lymph nodes. This may reflect a benign breast abscess depending upon clinical presentation, but locally advanced malignancy with involvement of the skin and lymph nodes would be difficult to confidently distinguish based on CT. Recommend initial targeted ultrasound to assess for drainable fluid collection. 2. The central superior vena cava is narrowed and effaced with intraluminal calcifications and extensive superficial vascular collateralization about the chest wall, generally suggestive chronic central venous stenosis secondary to prior indwelling catheter access. 3. Coronary artery disease and aortic atherosclerosis, markedly advanced for patient age. Aortic Atherosclerosis.   L.Breast I&D by CCS on 01/15/21   PUD Prophylaxis : None  Disposition Plan  :    Status is: Inpatient  Remains inpatient appropriate because:Unsafe d/c plan   Dispo: The patient is from: Home              Anticipated d/c is to: Home              Anticipated d/c date is: 2 days              Patient currently is medically stable to d/c.   Difficult to place patient Yes  DVT Prophylaxis  :   Heparin   Lab Results  Component Value Date   PLT 261 01/20/2021    Diet :  Diet Order            Diet renal with fluid restriction Fluid restriction: 1200 mL Fluid; Room service appropriate? Yes; Fluid consistency: Thin  Diet effective now                  Inpatient Medications  Scheduled Meds: . carvedilol  25 mg Oral BID WC  . Chlorhexidine Gluconate Cloth  6 each Topical Q0600  . Chlorhexidine Gluconate Cloth  6 each Topical Q0600  . Chlorhexidine Gluconate Cloth  6 each Topical Q0600  . cloNIDine  0.3  mg Oral TID  . heparin  5,000 Units Subcutaneous Q8H  . lidocaine-prilocaine  1 application Topical Q M,W,F-HD  . multivitamin  1 tablet Oral QHS  . senna  1 tablet Oral BID  . sevelamer carbonate  3,200 mg Oral TID WC  . sodium chloride flush  3 mL Intravenous Q12H   Continuous Infusions: . sodium chloride    . ampicillin-sulbactam (UNASYN) IV 3 g (01/20/21 2151)  . vancomycin Stopped (01/20/21 1036)   PRN Meds:.sodium chloride, acetaminophen **OR** [DISCONTINUED] acetaminophen, HYDROcodone-acetaminophen, sodium chloride flush  Antibiotics  :    Anti-infectives (From admission, onward)   Start     Dose/Rate Route Frequency Ordered Stop   01/20/21 0845  vancomycin (VANCOCIN) 1-5 GM/200ML-% IVPB       Note to Pharmacy: Judieth Keens  : cabinet override      01/20/21 0845 01/20/21 2059   01/18/21 1200  vancomycin (VANCOCIN) IVPB 1000 mg/200 mL premix        1,000 mg 200 mL/hr over 60 Minutes Intravenous Every M-W-F (Hemodialysis) 01/16/21 1017     01/17/21 2200  Ampicillin-Sulbactam (UNASYN) 3 g in sodium chloride 0.9 % 100 mL IVPB        3 g 200 mL/hr over 30 Minutes Intravenous Daily at bedtime 01/16/21 1058     01/17/21 1000  remdesivir 100 mg in sodium chloride 0.9 % 100 mL IVPB       "Followed by" Linked Group Details   100 mg 200 mL/hr over 30  Minutes Intravenous Daily 01/15/21 2357 01/18/21 0918   01/16/21 1145  Ampicillin-Sulbactam (UNASYN) 3 g in sodium chloride 0.9 % 100 mL IVPB        3 g 200 mL/hr over 30 Minutes Intravenous  Once 01/16/21 1058 01/16/21 1417   01/16/21 1100  vancomycin (VANCOREADY) IVPB 1500 mg/300 mL        1,500 mg 150 mL/hr over 120 Minutes Intravenous  Once 01/16/21 1017 01/16/21 1849   01/16/21 0300  remdesivir 200 mg in sodium chloride 0.9% 250 mL IVPB       "Followed by" Linked Group Details   200 mg 580 mL/hr over 30 Minutes Intravenous Once 01/15/21 2357 01/16/21 0543   01/15/21 2345  amoxicillin-clavulanate (AUGMENTIN) 875-125 MG per  tablet 1 tablet  Status:  Discontinued        1 tablet Oral Every 12 hours 01/15/21 2334 01/15/21 2357   01/15/21 1800  vancomycin (VANCOCIN) IVPB 1000 mg/200 mL premix        1,000 mg 200 mL/hr over 60 Minutes Intravenous  Once 01/15/21 1753 01/15/21 2120       Time Spent in minutes 28   Darliss Cheney M.D on 01/21/2021 at 1:22 PM  To page go to [email or web address]   [facility] -  Office  [phone]    See all Orders from today for further details    Objective:   Vitals:   01/20/21 1537 01/20/21 2020 01/21/21 0439 01/21/21 1007  BP: 110/68 117/65 130/84 121/65  Pulse: 73 75 79 68  Resp: '16 16 18 18  '$ Temp: 98.8 F (37.1 C) 98.7 F (37.1 C) 97.6 F (36.4 C) 98 F (36.7 C)  TempSrc: Oral Oral Oral Oral  SpO2: 98% 100% 96% 99%  Weight:      Height:        Wt Readings from Last 3 Encounters:  01/20/21 105.9 kg  10/23/20 109.3 kg  08/18/20 103.4 kg     Intake/Output Summary (Last 24 hours) at 01/21/2021 1322 Last data filed at 01/21/2021 0753 Gross per 24 hour  Intake 656.81 ml  Output --  Net 656.81 ml     Physical Exam  General exam: Appears calm and comfortable  Respiratory system: Clear to auscultation. Respiratory effort normal. Cardiovascular system: S1 & S2 heard, RRR. No JVD, murmurs, rubs, gallops or clicks. No pedal edema. Gastrointestinal system: Abdomen is nondistended, soft and nontender. No organomegaly or masses felt. Normal bowel sounds heard. Central nervous system: Alert and oriented. No focal neurological deficits. Extremities: Symmetric 5 x 5 power. Skin: No rashes, lesions or ulcers.  Psychiatry: Judgement and insight appear normal. Mood & affect appropriate.    Data Review:    CBC Recent Labs  Lab 01/15/21 1855 01/16/21 0821 01/17/21 0204 01/18/21 0136 01/19/21 0038 01/20/21 0020  WBC 10.9* 8.2 6.6 6.4 5.8 5.8  HGB 9.6* 9.2* 8.4* 8.2* 8.0* 7.9*  HCT 30.0* 29.5* 27.5* 27.6* 26.5* 25.2*  PLT 347 331 315 290 272 261  MCV  97.4 99.0 100.0 99.3 97.4 96.2  MCH 31.2 30.9 30.5 29.5 29.4 30.2  MCHC 32.0 31.2 30.5 29.7* 30.2 31.3  RDW 16.3* 16.4* 16.4* 16.0* 16.3* 16.1*  LYMPHSABS 0.8  --  1.0 0.9 0.8 0.7  MONOABS 0.9  --  0.4 0.4 0.4 0.5  EOSABS 0.1  --  0.3 0.3 0.1 0.2  BASOSABS 0.0  --  0.0 0.0 0.0 0.0    Recent Labs  Lab 01/16/21 0512 01/17/21 0204 01/17/21 0205 01/17/21 1145 01/18/21 0136  01/19/21 0030 01/19/21 0038 01/20/21 0020  NA 141 138  --   --  137  --  135 134*  K 3.8 3.7  --   --  3.9  --  3.3* 4.0  CL 94* 94*  --   --  92*  --  90* 90*  CO2 32 29  --   --  30  --  30 29  GLUCOSE 90 134*  --   --  107*  --  87 88  BUN 20 29*  --   --  40*  --  30* 36*  CREATININE 7.07* 8.84*  --   --  10.94*  --  8.52* 10.17*  CALCIUM 9.0 8.9  --   --  9.1  --  8.7* 9.1  AST 15 11*  --   --  11*  --  16 23  ALT 12 6  --   --  7  --  11 15  ALKPHOS 41 35*  --   --  35*  --  36* 37*  BILITOT 0.7 0.7  --   --  0.5  --  1.0 0.7  ALBUMIN 2.5* 2.2*  --   --  2.2*  --  2.3* 2.4*  MG 2.0 2.0  --   --  2.0  --  1.8 1.8  CRP 17.7* 14.1*  --   --  10.1*  --  7.3* 5.9*  DDIMER 2.11* 2.24*  --   --  2.50*  --  3.11* 2.56*  PROCALCITON 1.62  --   --  1.46 1.22  --  1.27  --   BNP 707.3*  --  1,072.2*  --  1,410.7* 1,122.4*  --  1,383.7*    ------------------------------------------------------------------------------------------------------------------ No results for input(s): CHOL, HDL, LDLCALC, TRIG, CHOLHDL, LDLDIRECT in the last 72 hours.  Lab Results  Component Value Date   HGBA1C 5.2 01/16/2019   ------------------------------------------------------------------------------------------------------------------ No results for input(s): TSH, T4TOTAL, T3FREE, THYROIDAB in the last 72 hours.  Invalid input(s): FREET3  Cardiac Enzymes No results for input(s): CKMB, TROPONINI, MYOGLOBIN in the last 168 hours.  Invalid input(s):  CK ------------------------------------------------------------------------------------------------------------------    Component Value Date/Time   BNP 1,383.7 (H) 01/20/2021 0020    Micro Results Recent Results (from the past 240 hour(s))  SARS Coronavirus 2 by RT PCR (hospital order, performed in [facility] hospital lab) Nasopharyngeal Nasopharyngeal Swab     Status: Abnormal   Collection Time: 01/15/21  5:53 PM   Specimen: Nasopharyngeal Swab  Result Value Ref Range Status   SARS Coronavirus 2 POSITIVE (A) NEGATIVE Final    Comment: RESULT CALLED TO, READ BACK BY AND VERIFIED WITH: Eustaquio Boyden RN 01/15/21 2209 JDW (NOTE) SARS-CoV-2 target nucleic acids are DETECTED  SARS-CoV-2 RNA is generally detectable in upper respiratory specimens  during the acute phase of infection.  Positive results are indicative  of the presence of the identified virus, but do not rule out bacterial infection or co-infection with other pathogens not detected by the test.  Clinical correlation with patient history and  other diagnostic information is necessary to determine patient infection status.  The expected result is negative.  Fact Sheet for Patients:   StrictlyIdeas.no   Fact Sheet for Healthcare Providers:   BankingDealers.co.za    This test is not yet approved or cleared by the [place] FDA and  has been authorized for detection and/or diagnosis of SARS-CoV-2 by FDA under an Emergency Use Authorization (EUA).  This EUA will  remain in effect (meaning this  test can be used) for the duration of  the COVID-19 declaration under Section 564(b)(1) of the Act, 21 U.S.C. section 360-bbb-3(b)(1), unless the authorization is terminated or revoked sooner.  Performed at [facility], [place] [address], [place], [place] [address]   SARS CORONAVIRUS 2 (TAT 6-24 HRS) Nasopharyngeal Nasopharyngeal Swab     Status: None   Collection Time:  01/15/21  5:55 PM   Specimen: Nasopharyngeal Swab  Result Value Ref Range Status   SARS Coronavirus 2 NEGATIVE NEGATIVE Final    Comment: (NOTE) SARS-CoV-2 target nucleic acids are NOT DETECTED.  The SARS-CoV-2 RNA is generally detectable in upper and lower respiratory specimens during the acute phase of infection. Negative results do not preclude SARS-CoV-2 infection, do not rule out co-infections with other pathogens, and should not be used as the sole basis for treatment or other patient management decisions. Negative results must be combined with clinical observations, patient history, and epidemiological information. The expected result is Negative.  Fact Sheet for Patients: SugarRoll.be  Fact Sheet for Healthcare Providers: https://www.woods-mathews.com/  This test is not yet approved or cleared by the [place] FDA and  has been authorized for detection and/or diagnosis of SARS-CoV-2 by FDA under an Emergency Use Authorization (EUA). This EUA will remain  in effect (meaning this test can be used) for the duration of the COVID-19 declaration under Se ction 564(b)(1) of the Act, 21 U.S.C. section 360bbb-3(b)(1), unless the authorization is terminated or revoked sooner.  Performed at [facility], [place] [address], [place],  [address]   Aerobic/Anaerobic Culture (surgical/deep wound)     Status: None (Preliminary result)   Collection Time: 01/16/21 12:09 AM   Specimen: Abscess  Result Value Ref Range Status   Specimen Description ABSCESS LEFT BREAST  Final   Special Requests NONE  Final   Gram Stain   Final    NO WBC SEEN FEW GRAM POSITIVE COCCI Performed at [facility], [address] [place],  [address]    Culture   Final    MIXED ANAEROBIC FLORA PRESENT.  CALL LAB IF FURTHER IID REQUIRED.   Report Status PENDING  Incomplete  SARS CORONAVIRUS 2 (TAT 6-24 HRS) Nasopharyngeal Nasopharyngeal Swab      Status: None   Collection Time: 01/18/21  9:13 AM   Specimen: Nasopharyngeal Swab  Result Value Ref Range Status   SARS Coronavirus 2 NEGATIVE NEGATIVE Final    Comment: (NOTE) SARS-CoV-2 target nucleic acids are NOT DETECTED.  The SARS-CoV-2 RNA is generally detectable in upper and lower respiratory specimens during the acute phase of infection. Negative results do not preclude SARS-CoV-2 infection, do not rule out co-infections with other pathogens, and should not be used as the sole basis for treatment or other patient management decisions. Negative results must be combined with clinical observations, patient history, and epidemiological information. The expected result is Negative.  Fact Sheet for Patients: SugarRoll.be  Fact Sheet for Healthcare Providers: https://www.woods-mathews.com/  This test is not yet approved or cleared by the [place] FDA and  has been authorized for detection and/or diagnosis of SARS-CoV-2 by FDA under an Emergency Use Authorization (EUA). This EUA will remain  in effect (meaning this test can be used) for the duration of the COVID-19 declaration under Se ction 564(b)(1) of the Act, 21 U.S.C. section 360bbb-3(b)(1), unless the authorization is terminated or revoked sooner.  Performed at [facility], [place],  [place] [address]   MRSA PCR Screening     Status: None   Collection Time: 01/19/21  2:16 PM   Specimen: Nasal Mucosa; Nasopharyngeal  Result Value Ref Range Status   MRSA by PCR NEGATIVE NEGATIVE Final    Comment:        The GeneXpert MRSA Assay (FDA approved for NASAL specimens only), is one component of a comprehensive MRSA colonization surveillance program. It is not intended to diagnose MRSA infection nor to guide or monitor treatment for MRSA infections. Performed at [facility], [place] [address], [place], [place] [address]     Radiology Reports CT  Chest W Contrast  Result Date: 01/05/2021 CLINICAL DATA:  Soft tissue mass, breast mass versus infection EXAM: CT CHEST WITH CONTRAST TECHNIQUE: Multidetector CT imaging of the chest was performed during intravenous contrast administration. CONTRAST:  124m OMNIPAQUE IOHEXOL 300 MG/ML  SOLN COMPARISON:  None. FINDINGS: Cardiovascular: Aortic atherosclerosis. Normal heart size. Three-vessel coronary artery calcifications and/or stents. No pericardial effusion. The central superior vena cava is narrowed and effaced with intraluminal calcifications and extensive superficial vascular collateralization about the chest wall (series 3, image 43, series 6, image 75). Mediastinum/Nodes: No enlarged mediastinal or hilar lymph nodes. Enlarged left axillary and subpectoral lymph nodes measuring up to 2.0 x 1.2 cm (series 3, image 25). Thyroid gland, trachea, and esophagus demonstrate no significant findings. Lungs/Pleura: Lungs are clear. No pleural effusion or pneumothorax. Upper Abdomen: No acute abnormality. Atrophic appearance of the partially included kidneys in the upper abdomen. Musculoskeletal: There is extensive subcutaneous fat stranding and skin thickening of the left breast. There is a internally hypoattenuating mass or fluid collection in the subareolar left breast measuring 7.3 x 7.0 x 4.8 cm (series 3, image 69, series 6, image 37). IMPRESSION: 1. There is extensive subcutaneous fat stranding and skin thickening of the left breast. There is a internally hypoattenuating mass or fluid collection in the subareolar left breast measuring 7.3 x 7.0 x 4.8 cm. Enlarged left axillary and subpectoral lymph nodes. This may reflect a benign breast abscess depending upon clinical presentation, but locally advanced malignancy with involvement of the skin and lymph nodes would be difficult to confidently distinguish based on CT. Recommend initial targeted ultrasound to assess for drainable fluid collection. 2. The central  superior vena cava is narrowed and effaced with intraluminal calcifications and extensive superficial vascular collateralization about the chest wall, generally suggestive chronic central venous stenosis secondary to prior indwelling catheter access. 3. Coronary artery disease and aortic atherosclerosis, markedly advanced for patient age. Aortic Atherosclerosis (ICD10-I70.0). Electronically Signed   By: AEddie CandleM.D.   On: 01/05/2021 14:00   Portable chest 1 View  Result Date: 01/16/2021 CLINICAL DATA:  COVID-19. EXAM: PORTABLE CHEST 1 VIEW COMPARISON:  Chest CT 01/05/2021 FINDINGS: Cardiac contours upper limits of normal. No large area pulmonary consolidation. Minimal left basilar atelectasis. No pleural effusion or pneumothorax. Osseous structures unremarkable. IMPRESSION: No acute cardiopulmonary process. Electronically Signed   By: DLovey NewcomerM.D.   On: 01/16/2021 08:13   [place]BREAST COMPLETE UNI LEFT INC AXILLA  Result Date: 01/05/2021 CLINICAL DATA:  Follow-up abnormal CT chest. EXAM: ULTRASOUND OF THE LEFT BREAST COMPARISON:  CT 01/05/2021. FINDINGS: Left breast ultrasound reveals a 6.2 x 4.5 x 7.2 cm complex cystic mass. Although tumor cannot be excluded, abscess and or hematoma would be more likely. Close follow-up exam suggested to demonstrate complete resolution in order to exclude a tumor. IMPRESSION: Left breast soft tissue edema. Left  breast complex cystic mass. Although tumor cannot be excluded, abscess and or hematoma would be more likely. Aspiration of the left breast cystic mass should be considered. RECOMMENDATION: Aspiration of the left breast cystic mass should be considered. Close follow-up exams suggested to demonstrate complete resolution in order to exclude a tumor. I BI-RADS CATEGORY  BI-RADS category 4. Electronically Signed   By: Marcello Moores  Register   On: 01/05/2021 15:57

## 2021-01-21 NOTE — TOC Progression Note (Signed)
Transition of Care [facility]) - Progression Note    Patient Details  Name: Rebekah Burton MRN: [id number] Date of Birth: 22-Apr-1976  [facility] CM/SW Contact  Bartholomew Crews, RN Phone Number: [phone] 01/21/2021, 2:40 PM  Clinical Narrative:     [facility] RN referral accepted by [facility]. TOC following for transition needs.  Expected Discharge Plan: [place] [facility] Barriers to Discharge: Other (comment) (working on dialysis and Medicaid transfers to [employer])  Expected Discharge Plan and Services Expected Discharge Plan: [place]   Discharge Planning Services: CM Consult Post Acute Care Choice: [place] arrangements for the past 2 months: Single Family Home                 DME Arranged: N/A DME Agency: NA       HH Arranged: RN [place] Agency: [facility] Date [place]: 01/21/21 Time [place]: [id number] Representative spoke with at [place]: [place] ([place]) Interventions    Readmission Risk Interventions No flowsheet data found.

## 2021-01-21 NOTE — Progress Notes (Signed)
Renal Navigator appreciates update from [place] regarding patient's plan to move to Significant Other's home/[place] in [place] [facility], address: [place], [place]. Navigator spoke with patient's current HD clinic[facility] who will [facility] request today to [facility]) to request seat at HD clinic in [place]. Navigator will continue to follow and update [facility] Social Worker/Lisa also, who has been working with patient. New HD seat needs to be secured prior to discharge, unless patient has a place to go to continue dialyzing at [facility] clinic while this gets worked out. This was discussed with TOC to see if [facility] could return to [facility] while transfer is in process.  Alphonzo Cruise, [place] Renal Navigator [phone]

## 2021-01-21 NOTE — Progress Notes (Signed)
Pharmacy Antibiotic Note  Rebekah Burton is a 45 y.o. female admitted on 01/15/2021 with L-breast abscess/cellulitis. Pharmacy has been consulted for Vancomycin dosing.  HD on schedule  Plan: - Continue Vancomycin 1g/HD-MWF  - Continue Unasyn 3g IV  daily at bedtime - Will continue to follow HD schedule/duration, culture results, LOT, and antibiotic de-escalation plans   Height: '5\' 4"'$  (162.6 cm) Weight: 105.9 kg (233 lb 7.5 oz) IBW/kg (Calculated) : 54.7  Temp (24hrs), Avg:98.3 F (36.8 C), Min:97.6 F (36.4 C), Max:98.8 F (37.1 C)  Recent Labs  Lab 01/16/21 0512 01/16/21 0821 01/17/21 0204 01/18/21 0136 01/19/21 0038 01/20/21 0020  WBC  --  8.2 6.6 6.4 5.8 5.8  CREATININE 7.07*  --  8.84* 10.94* 8.52* 10.17*    Estimated Creatinine Clearance: 8.6 mL/min (A) (by C-G formula based on SCr of 10.17 mg/dL (H)).    Allergies  Allergen Reactions  . Percocet [Oxycodone-Acetaminophen] Hives    Antimicrobials this admission: Remdesvir 1/22 >> 1/24 Vancomycin 1/22 >> Unasyn 1/22 >>  Microbiology results: 1/21 COVID >> positive 1/21 L-breast abscess >> few GPC  Thank you Anette Guarneri, PharmD Please utilize Amion for appropriate phone number to reach the unit pharmacist ([place])

## 2021-01-21 NOTE — TOC Progression Note (Signed)
Transition of Care [facility]) - Progression Note    Patient Details  Name: Ronnetta Faunce MRN: [id number] Date of Birth: [birth year]  Transition of [facility]) CM/SW Contact  Bartholomew Crews, RN Phone Number: [phone] 01/21/2021, 12:53 PM  Clinical Narrative:     Received call back from Morene Antu at [phone]. Confirmed that patient will be discharging to his home at [address] [place], [place] [address]. He is comfortable with doing the dressing change, and understands that discharge teaching is available at the hospital if he is able to visit.   [place] RN referral pending with Alvis Lemmings for [facility] branch.   TOC following for transition needs.        Expected Discharge Plan and Services                                                 Social Determinants of Health (SDOH) Interventions    Readmission Risk Interventions No flowsheet data found.

## 2021-01-22 DIAGNOSIS — N611 Abscess of the breast and nipple: Secondary | ICD-10-CM | POA: Diagnosis not present

## 2021-01-22 DIAGNOSIS — N186 End stage renal disease: Secondary | ICD-10-CM | POA: Diagnosis not present

## 2021-01-22 DIAGNOSIS — U071 COVID-19: Secondary | ICD-10-CM | POA: Diagnosis not present

## 2021-01-22 NOTE — Progress Notes (Signed)
[facility] Progress Note   Subjective: Seen in room still C/O L breast pain. HD today.   Objective Vitals:   01/21/21 1638 01/21/21 2052 01/22/21 0450 01/22/21 1026  BP: (!) 143/74 126/70 133/75 135/69  Pulse: 79 71 68 69  Resp: '18 20 20 14  '$ Temp: 98.1 F (36.7 C) 98.6 F (37 C) 97.6 F (36.4 C) 98.6 F (37 C)  TempSrc: Oral Oral Oral Oral  SpO2: 98% 98% 100% 99%  Weight:      Height:       Physical Exam General: obese female in NAD Heart: S1,S2 no M/R/G Lungs: CTAB A/P Abdomen: S, NT Extremities: no LE edema Dialysis Access: L AVF + bruit   Additional Objective Labs: Basic Metabolic Panel: Recent Labs  Lab 01/18/21 0136 01/19/21 0038 01/20/21 0020  NA 137 135 134*  K 3.9 3.3* 4.0  CL 92* 90* 90*  CO2 '30 30 29  '$ GLUCOSE 107* 87 88  BUN 40* 30* 36*  CREATININE 10.94* 8.52* 10.17*  CALCIUM 9.1 8.7* 9.1  PHOS  --   --  5.2*   Liver Function Tests: Recent Labs  Lab 01/18/21 0136 01/19/21 0038 01/20/21 0020  AST 11* 16 23  ALT '7 11 15  '$ ALKPHOS 35* 36* 37*  BILITOT 0.5 1.0 0.7  PROT 6.0* 6.0* 6.0*  ALBUMIN 2.2* 2.3* 2.4*   No results for input(s): LIPASE, AMYLASE in the last 168 hours. CBC: Recent Labs  Lab 01/16/21 0821 01/17/21 0204 01/18/21 0136 01/19/21 0038 01/20/21 0020  WBC 8.2 6.6 6.4 5.8 5.8  NEUTROABS  --  4.9 4.7 4.5 4.3  HGB 9.2* 8.4* 8.2* 8.0* 7.9*  HCT 29.5* 27.5* 27.6* 26.5* 25.2*  MCV 99.0 100.0 99.3 97.4 96.2  PLT 331 315 290 272 261   Blood Culture    Component Value Date/Time   SDES ABSCESS LEFT BREAST 01/16/2021 0009   SPECREQUEST NONE 01/16/2021 0009   CULT  01/16/2021 0009    MIXED ANAEROBIC FLORA PRESENT.  CALL LAB IF FURTHER IID REQUIRED.   REPTSTATUS 01/21/2021 FINAL 01/16/2021 0009    Cardiac Enzymes: No results for input(s): CKTOTAL, CKMB, CKMBINDEX, TROPONINI in the last 168 hours. CBG: No results for input(s): GLUCAP in the last 168 hours. Iron Studies: No results for input(s): IRON,  TIBC, TRANSFERRIN, FERRITIN in the last 72 hours. '@lablastinr3'$ @ Studies/Results: No results found. Medications: . sodium chloride    . ampicillin-sulbactam (UNASYN) IV 3 g (01/21/21 2142)   . carvedilol  25 mg Oral BID WC  . Chlorhexidine Gluconate Cloth  6 each Topical Q0600  . Chlorhexidine Gluconate Cloth  6 each Topical Q0600  . Chlorhexidine Gluconate Cloth  6 each Topical Q0600  . cloNIDine  0.3 mg Oral TID  . heparin  5,000 Units Subcutaneous Q8H  . lidocaine-prilocaine  1 application Topical Q M,W,F-HD  . multivitamin  1 tablet Oral QHS  . senna  1 tablet Oral BID  . sevelamer carbonate  3,200 mg Oral TID WC  . sodium chloride flush  3 mL Intravenous Q12H     Dialysis Orders: MWF  4h 50mn 2/2.5 bath 105kg AVF Heparin ? parsabiv 15 mg three times a week with HD mircera 150 mcg every 2 weeks - last given 01/13/21 hectorol 3 mcg three times a week  Assessment/Plan: #End-stage renal disease - Tolerating dialysis well - HD per MWF schedule.Next HDtoday.  # Left breast abscess -S/p I&D by general surgery.Antibiotics per primary team -off of morphine now, please avoid use  in ESRD pts. Pain management per primary.   # covid 19 infection  - Therapies per primary team. Off covid isolation s/p two negative tests - Can return to her regular outpatient HD seat at discharge  # Acute hypoxic resp failure  - supportive care including oxygen  - optimize volume status as able with HD  - covid management as above -symptoms resolved  # HTN  - acceptable control - much better control here than outpatient and pt agrees she feels better with normal BP. Emphasized the importance of med compliance at discharge  # Anemia CKD -Hgb7.9. -ESArecently given, not due for next dose yet. - No IV Fe given acute infection - Transfuse PRN  # Metabolic bone disease -Phos at goal. -Corrected calcium slightly high, hectorol on hold - not able to get parsabiv  here  Velva Harman H. Alyxander Kollmann NP-C 01/22/2021, 1:03 PM  [employer] [phone]

## 2021-01-22 NOTE — Progress Notes (Signed)
[facility] Progress Note  7 Days Post-Op  Subjective: CC-  Doing well, reports that pain and swelling continue to improve, tolerating dressing changes well.  Objective: Vital signs in last 24 hours: Temp:  [97.6 F (36.4 C)-98.6 F (37 C)] 97.6 F (36.4 C) (01/28 0450) Pulse Rate:  [68-79] 68 (01/28 0450) Resp:  [18-20] 20 (01/28 0450) BP: (121-143)/(65-75) 133/75 (01/28 0450) SpO2:  [98 %-100 %] 100 % (01/28 0450) Last BM Date: 01/20/21  Intake/Output from previous day: 01/27 0701 - 01/28 0700 In: 817.7 [P.O.:717; IV Piggyback:100.7] Out: -  Intake/Output this shift: Total I/O In: 237 [P.O.:237] Out: -   PE: Gen: Alert, NAD, pleasant Pulm: rate and effort normal Abd: Soft, NT/ND Skin: warm and dry Left breast: wound ~4x6cm and very deep with some surrounding induration>> induration is less and present from about 10 to 12 o'clock position, no purulent drainage and no active bleeding  Lab Results:  Recent Labs    01/20/21 0020  WBC 5.8  HGB 7.9*  HCT 25.2*  PLT 261   BMET Recent Labs    01/20/21 0020  NA 134*  K 4.0  CL 90*  CO2 29  GLUCOSE 88  BUN 36*  CREATININE 10.17*  CALCIUM 9.1   PT/INR No results for input(s): LABPROT, INR in the last 72 hours. CMP     Component Value Date/Time   NA 134 (L) 01/20/2021 0020   K 4.0 01/20/2021 0020   CL 90 (L) 01/20/2021 0020   CO2 29 01/20/2021 0020   GLUCOSE 88 01/20/2021 0020   BUN 36 (H) 01/20/2021 0020   CREATININE 10.17 (H) 01/20/2021 0020   CALCIUM 9.1 01/20/2021 0020   PROT 6.0 (L) 01/20/2021 0020   ALBUMIN 2.4 (L) 01/20/2021 0020   AST 23 01/20/2021 0020   ALT 15 01/20/2021 0020   ALKPHOS 37 (L) 01/20/2021 0020   BILITOT 0.7 01/20/2021 0020   GFRNONAA 4 (L) 01/20/2021 0020   GFRAA 5 (L) 02/13/2019 0734   Lipase     Component Value Date/Time   LIPASE 33 07/17/2018 1145       Studies/Results: No results found.  Anti-infectives: Anti-infectives (From admission,  onward)   Start     Dose/Rate Route Frequency Ordered Stop   01/20/21 0845  vancomycin (VANCOCIN) 1-5 GM/200ML-% IVPB       Note to Pharmacy: Judieth Keens  : cabinet override      01/20/21 0845 01/20/21 2059   01/18/21 1200  vancomycin (VANCOCIN) IVPB 1000 mg/200 mL premix  Status:  Discontinued        1,000 mg 200 mL/hr over 60 Minutes Intravenous Every M-W-F (Hemodialysis) 01/16/21 1017 01/21/21 1324   01/17/21 2200  Ampicillin-Sulbactam (UNASYN) 3 g in sodium chloride 0.9 % 100 mL IVPB        3 g 200 mL/hr over 30 Minutes Intravenous Daily at bedtime 01/16/21 1058     01/17/21 1000  remdesivir 100 mg in sodium chloride 0.9 % 100 mL IVPB       "Followed by" Linked Group Details   100 mg 200 mL/hr over 30 Minutes Intravenous Daily 01/15/21 2357 01/18/21 0918   01/16/21 1145  Ampicillin-Sulbactam (UNASYN) 3 g in sodium chloride 0.9 % 100 mL IVPB        3 g 200 mL/hr over 30 Minutes Intravenous  Once 01/16/21 1058 01/16/21 1417   01/16/21 1100  vancomycin (VANCOREADY) IVPB 1500 mg/300 mL        1,500 mg 150 mL/hr  over 120 Minutes Intravenous  Once 01/16/21 1017 01/16/21 1849   01/16/21 0300  remdesivir 200 mg in sodium chloride 0.9% 250 mL IVPB       "Followed by" Linked Group Details   200 mg 580 mL/hr over 30 Minutes Intravenous Once 01/15/21 2357 01/16/21 0543   01/15/21 2345  amoxicillin-clavulanate (AUGMENTIN) 875-125 MG per tablet 1 tablet  Status:  Discontinued        1 tablet Oral Every 12 hours 01/15/21 2334 01/15/21 2357   01/15/21 1800  vancomycin (VANCOCIN) IVPB 1000 mg/200 mL premix        1,000 mg 200 mL/hr over 60 Minutes Intravenous  Once 01/15/21 1753 01/15/21 2120       Assessment/Plan ESRD PAD Blind Chronic anemia Obesity BMI 40.76 COVID+  Left breast abscess S/pIncision and drainage of left breast abscess with excisional debridement1/21 Dr. Zenia Resides -POD#7 -Path negative for malignancy, cx currently with gram positive cocci - Continue BID wet to  dry dressing changes.Ok to shower with wound open. Continue IV antibiotics and follow cx and path. Recommend continuing antibiotics, ok to transition to oral if discharged (end date 01/24/21). Wound care instructions and follow up info on AVS. Ok for discharge once dispo arranged. Home health orders placed. We will sign off at this time.  ID -vancomycin 1/21>>, unasyn 1/22>> FEN -renal diet VTE -SCDs, sq heparin   LOS: 7 days    Rebekah Riley, MD [facility] 01/22/2021, 8:46 AM Please see Amion for pager number during day hours 7:00am-4:30pm

## 2021-01-22 NOTE — Plan of Care (Signed)
  Problem: Clinical Measurements: Goal: Diagnostic test results will improve Outcome: Progressing   Problem: Activity: Goal: Risk for activity intolerance will decrease Outcome: Progressing   

## 2021-01-22 NOTE — Care Management Important Message (Signed)
Important Message  Patient Details  Name: Rebekah Burton MRN: [id number] Date of Birth: 06-Jun-1976   Medicare Important Message Given:  Yes - Important Message mailed due to current National Emergency   Verbal consent obtained due to current National Emergency  Relationship to patient: Self Contact Name: Kelinda Duffany Call Date: 01/22/21  Time: [id number] Phone: [phone] Outcome: Spoke with contact Important Message mailed to: Patient address on file    Delorse Lek 01/22/2021, 1:32 PM

## 2021-01-22 NOTE — Progress Notes (Signed)
@   [address] RN Lorane Gell L made aware patient Hemodialysis will be done on tomorrow.

## 2021-01-22 NOTE — Progress Notes (Signed)
PT Cancellation Note  Patient Details Name: Rebekah Burton MRN: [id number] DOB: [birth year]   Cancelled Treatment:    Reason Eval/Treat Not Completed: Medical issues which prohibited therapy; patient c/o nausea and vomiting and pain and currently refusing ambulation.  RN made aware and encouraged pt to walk with nursing when feeling better.  Magda Kiel, PT [facility] [id number] Office:[phone] 01/22/2021  [name] 01/22/2021, 4:55 PM

## 2021-01-22 NOTE — Progress Notes (Signed)
PROGRESS NOTE  Rebekah Burton [id number] DOB: 1976/01/28 DOA: 01/15/2021 PCP: Trey Sailors, PA   LOS: 7 days   Brief Narrative / Interim history: 45 year old female with ESRD on HD MWF, blindness, hypertension, PAD came into the hospital with a breast abscess.  General surgery consulted, she is status post incision and drainage.  She was also incidental Covid.  Subjective / 24h Interval events: Complains of pain in the left breast where she had I&D  Assessment & Plan: Principal Problem Breast abscess -General surgery consulted, she is status post I&D done by general surgery.  Preliminary cultures with gram-positive's, continue Unasyn.  Awaiting speciation/sensitivities  Active Problems Incidental COVID-19-asymptomatic, no evidence of pulmonary involvement.  She had one positive and one negative test in the last few days, repeat the third test was negative, isolation discontinued on 1/25  ESRD-MWF schedule, nephrology consulted and following  Anemia of chronic kidney disease-monitor, hemoglobin stable  Hypertension-continue home medications  Disposition-patient will live with her boyfriend following this hospital stay, emergency room.  TOC working on moving her Medicaid to her future [place].  She will need to have that for transportation to and from dialysis center  Scheduled Meds: . carvedilol  25 mg Oral BID WC  . Chlorhexidine Gluconate Cloth  6 each Topical Q0600  . Chlorhexidine Gluconate Cloth  6 each Topical Q0600  . Chlorhexidine Gluconate Cloth  6 each Topical Q0600  . cloNIDine  0.3 mg Oral TID  . heparin  5,000 Units Subcutaneous Q8H  . lidocaine-prilocaine  1 application Topical Q M,W,F-HD  . multivitamin  1 tablet Oral QHS  . senna  1 tablet Oral BID  . sevelamer carbonate  3,200 mg Oral TID WC  . sodium chloride flush  3 mL Intravenous Q12H   Continuous Infusions: . sodium chloride    . ampicillin-sulbactam (UNASYN) IV 3 g (01/21/21 2142)   PRN  Meds:.sodium chloride, acetaminophen **OR** [DISCONTINUED] acetaminophen, HYDROcodone-acetaminophen, sodium chloride flush  Diet Orders (From admission, onward)    Start     Ordered   01/15/21 2353  Diet renal with fluid restriction Fluid restriction: 1200 mL Fluid; Room service appropriate? Yes; Fluid consistency: Thin  Diet effective now       Question Answer Comment  Fluid restriction: 1200 mL Fluid   Room service appropriate? Yes   Fluid consistency: Thin      01/15/21 2357          DVT prophylaxis: heparin injection 5,000 Units Start: 01/16/21 0600     Code Status: Full Code  Family Communication: no family at bedside   Status is: Inpatient  Remains inpatient appropriate because:Inpatient level of care appropriate due to severity of illness   Dispo: The patient is from: Home              Anticipated d/c is to: Home              Anticipated d/c date is: 1 day              Patient currently is not medically stable to d/c.   Difficult to place patient No  Level of care: Med-Surg  Consultants:  General surgery  Nephrology   Procedures:  I&D  Microbiology  GPC in abscess culture  Antimicrobials: Unasyn     Objective: Vitals:   01/21/21 1007 01/21/21 1638 01/21/21 2052 01/22/21 0450  BP: 121/65 (!) 143/74 126/70 133/75  Pulse: 68 79 71 68  Resp: '18 18 20 20  '$ Temp: 98 F (36.7  C) 98.1 F (36.7 C) 98.6 F (37 C) 97.6 F (36.4 C)  TempSrc: Oral Oral Oral Oral  SpO2: 99% 98% 98% 100%  Weight:      Height:        Intake/Output Summary (Last 24 hours) at 01/22/2021 1023 Last data filed at 01/22/2021 0830 Gross per 24 hour  Intake 814.66 ml  Output --  Net 814.66 ml   Filed Weights   01/18/21 1834 01/20/21 0740 01/20/21 1041  Weight: 106.5 kg 108.7 kg 105.9 kg    Examination:  Constitutional: NAD Eyes: no scleral icterus ENMT: Mucous membranes are moist.  Neck: normal, supple Respiratory: clear to auscultation bilaterally, no wheezing, no  crackles. Normal respiratory effort. Cardiovascular: Regular rate and rhythm, no murmurs / rubs / gallops.  Abdomen: non distended, no tenderness. Bowel sounds positive.  Musculoskeletal: no clubbing / cyanosis.  Skin: no rashes Neurologic: CN 2-12 grossly intact. Strength 5/5 in all 4.    Data Reviewed: I have independently reviewed following labs and imaging studies   CBC: Recent Labs  Lab 01/15/21 1855 01/16/21 0821 01/17/21 0204 01/18/21 0136 01/19/21 0038 01/20/21 0020  WBC 10.9* 8.2 6.6 6.4 5.8 5.8  NEUTROABS 9.1*  --  4.9 4.7 4.5 4.3  HGB 9.6* 9.2* 8.4* 8.2* 8.0* 7.9*  HCT 30.0* 29.5* 27.5* 27.6* 26.5* 25.2*  MCV 97.4 99.0 100.0 99.3 97.4 96.2  PLT 347 331 315 290 272 0000000   Basic Metabolic Panel: Recent Labs  Lab 01/16/21 0512 01/17/21 0204 01/18/21 0136 01/19/21 0038 01/20/21 0020  NA 141 138 137 135 134*  K 3.8 3.7 3.9 3.3* 4.0  CL 94* 94* 92* 90* 90*  CO2 32 '29 30 30 29  '$ GLUCOSE 90 134* 107* 87 88  BUN 20 29* 40* 30* 36*  CREATININE 7.07* 8.84* 10.94* 8.52* 10.17*  CALCIUM 9.0 8.9 9.1 8.7* 9.1  MG 2.0 2.0 2.0 1.8 1.8  PHOS  --   --   --   --  5.2*   Liver Function Tests: Recent Labs  Lab 01/16/21 0512 01/17/21 0204 01/18/21 0136 01/19/21 0038 01/20/21 0020  AST 15 11* 11* 16 23  ALT '12 6 7 11 15  '$ ALKPHOS 41 35* 35* 36* 37*  BILITOT 0.7 0.7 0.5 1.0 0.7  PROT 6.5 5.9* 6.0* 6.0* 6.0*  ALBUMIN 2.5* 2.2* 2.2* 2.3* 2.4*   Coagulation Profile: No results for input(s): INR, PROTIME in the last 168 hours. HbA1C: No results for input(s): HGBA1C in the last 72 hours. CBG: No results for input(s): GLUCAP in the last 168 hours.  Recent Results (from the past 240 hour(s))  SARS Coronavirus 2 by RT PCR (hospital order, performed in [facility] hospital lab) Nasopharyngeal Nasopharyngeal Swab     Status: Abnormal   Collection Time: 01/15/21  5:53 PM   Specimen: Nasopharyngeal Swab  Result Value Ref Range Status   SARS Coronavirus 2 POSITIVE (A)  NEGATIVE Final    Comment: RESULT CALLED TO, READ BACK BY AND VERIFIED WITH: Eustaquio Boyden RN 01/15/21 2209 JDW (NOTE) SARS-CoV-2 target nucleic acids are DETECTED  SARS-CoV-2 RNA is generally detectable in upper respiratory specimens  during the acute phase of infection.  Positive results are indicative  of the presence of the identified virus, but do not rule out bacterial infection or co-infection with other pathogens not detected by the test.  Clinical correlation with patient history and  other diagnostic information is necessary to determine patient infection status.  The expected result is negative.  Fact Sheet for Patients:   StrictlyIdeas.no   Fact Sheet for Healthcare Providers:   BankingDealers.co.za    This test is not yet approved or cleared by the [place] FDA and  has been authorized for detection and/or diagnosis of SARS-CoV-2 by FDA under an Emergency Use Authorization (EUA).  This EUA will remain in effect (meaning this  test can be used) for the duration of  the COVID-19 declaration under Section 564(b)(1) of the Act, 21 U.S.C. section 360-bbb-3(b)(1), unless the authorization is terminated or revoked sooner.  Performed at [facility], [place] [address], [place], [place] [address]   SARS CORONAVIRUS 2 (TAT 6-24 HRS) Nasopharyngeal Nasopharyngeal Swab     Status: None   Collection Time: 01/15/21  5:55 PM   Specimen: Nasopharyngeal Swab  Result Value Ref Range Status   SARS Coronavirus 2 NEGATIVE NEGATIVE Final    Comment: (NOTE) SARS-CoV-2 target nucleic acids are NOT DETECTED.  The SARS-CoV-2 RNA is generally detectable in upper and lower respiratory specimens during the acute phase of infection. Negative results do not preclude SARS-CoV-2 infection, do not rule out co-infections with other pathogens, and should not be used as the sole basis for treatment or other patient management  decisions. Negative results must be combined with clinical observations, patient history, and epidemiological information. The expected result is Negative.  Fact Sheet for Patients: SugarRoll.be  Fact Sheet for Healthcare Providers: https://www.woods-mathews.com/  This test is not yet approved or cleared by the [place] FDA and  has been authorized for detection and/or diagnosis of SARS-CoV-2 by FDA under an Emergency Use Authorization (EUA). This EUA will remain  in effect (meaning this test can be used) for the duration of the COVID-19 declaration under Se ction 564(b)(1) of the Act, 21 U.S.C. section 360bbb-3(b)(1), unless the authorization is terminated or revoked sooner.  Performed at [facility], [place] [address], [place], [place] [address]   Aerobic/Anaerobic Culture (surgical/deep wound)     Status: None   Collection Time: 01/16/21 12:09 AM   Specimen: Abscess  Result Value Ref Range Status   Specimen Description ABSCESS LEFT BREAST  Final   Special Requests NONE  Final   Gram Stain   Final    NO WBC SEEN FEW GRAM POSITIVE COCCI Performed at [facility], [address] [place], [place] [address]    Culture   Final    MIXED ANAEROBIC FLORA PRESENT.  CALL LAB IF FURTHER IID REQUIRED.   Report Status 01/21/2021 FINAL  Final  SARS CORONAVIRUS 2 (TAT 6-24 HRS) Nasopharyngeal Nasopharyngeal Swab     Status: None   Collection Time: 01/18/21  9:13 AM   Specimen: Nasopharyngeal Swab  Result Value Ref Range Status   SARS Coronavirus 2 NEGATIVE NEGATIVE Final    Comment: (NOTE) SARS-CoV-2 target nucleic acids are NOT DETECTED.  The SARS-CoV-2 RNA is generally detectable in upper and lower respiratory specimens during the acute phase of infection. Negative results do not preclude SARS-CoV-2 infection, do not rule out co-infections with other pathogens, and should not be used as the sole basis for treatment or other  patient management decisions. Negative results must be combined with clinical observations, patient history, and epidemiological information. The expected result is Negative.  Fact Sheet for Patients: SugarRoll.be  Fact Sheet for Healthcare Providers: https://www.woods-mathews.com/  This test is not yet approved or cleared by the [place] FDA and  has been authorized for detection and/or diagnosis of SARS-CoV-2 by FDA under an  Emergency Use Authorization (EUA). This EUA will remain  in effect (meaning this test can be used) for the duration of the COVID-19 declaration under Se ction 564(b)(1) of the Act, 21 U.S.C. section 360bbb-3(b)(1), unless the authorization is terminated or revoked sooner.  Performed at [facility], [place] [address], [place], [place] [address]   MRSA PCR Screening     Status: None   Collection Time: 01/19/21  2:16 PM   Specimen: Nasal Mucosa; Nasopharyngeal  Result Value Ref Range Status   MRSA by PCR NEGATIVE NEGATIVE Final    Comment:        The GeneXpert MRSA Assay (FDA approved for NASAL specimens only), is one component of a comprehensive MRSA colonization surveillance program. It is not intended to diagnose MRSA infection nor to guide or monitor treatment for MRSA infections. Performed at [facility], [place] [address], [place], [place] [address]      Radiology Studies: No results found.  Marzetta Board, MD, PhD [facility]  Between 7 am - 7 pm I am available, please contact me via Amion or Securechat  Between 7 pm - 7 am I am not available, please contact night coverage MD/APP via Amion

## 2021-01-23 DIAGNOSIS — U071 COVID-19: Secondary | ICD-10-CM | POA: Diagnosis not present

## 2021-01-23 DIAGNOSIS — N611 Abscess of the breast and nipple: Secondary | ICD-10-CM | POA: Diagnosis not present

## 2021-01-23 DIAGNOSIS — N186 End stage renal disease: Secondary | ICD-10-CM | POA: Diagnosis not present

## 2021-01-23 LAB — CBC
HCT: 25.4 % — ABNORMAL LOW (ref 36.0–46.0)
Hemoglobin: 7.9 g/dL — ABNORMAL LOW (ref 12.0–15.0)
MCH: 29.9 pg (ref 26.0–34.0)
MCHC: 31.1 g/dL (ref 30.0–36.0)
MCV: 96.2 fL (ref 80.0–100.0)
Platelets: 244 10*3/uL (ref 150–400)
RBC: 2.64 MIL/uL — ABNORMAL LOW (ref 3.87–5.11)
RDW: 16.2 % — ABNORMAL HIGH (ref 11.5–15.5)
WBC: 5.6 10*3/uL (ref 4.0–10.5)
nRBC: 0 % (ref 0.0–0.2)

## 2021-01-23 LAB — COMPREHENSIVE METABOLIC PANEL
ALT: 35 U/L (ref 0–44)
AST: 46 U/L — ABNORMAL HIGH (ref 15–41)
Albumin: 2.6 g/dL — ABNORMAL LOW (ref 3.5–5.0)
Alkaline Phosphatase: 42 U/L (ref 38–126)
Anion gap: 17 — ABNORMAL HIGH (ref 5–15)
BUN: 56 mg/dL — ABNORMAL HIGH (ref 6–20)
CO2: 25 mmol/L (ref 22–32)
Calcium: 9.8 mg/dL (ref 8.9–10.3)
Chloride: 92 mmol/L — ABNORMAL LOW (ref 98–111)
Creatinine, Ser: 13.43 mg/dL — ABNORMAL HIGH (ref 0.44–1.00)
GFR, Estimated: 3 mL/min — ABNORMAL LOW (ref >60)
Glucose, Bld: 95 mg/dL (ref 70–99)
Potassium: 4.8 mmol/L (ref 3.5–5.1)
Sodium: 134 mmol/L — ABNORMAL LOW (ref 135–145)
Total Bilirubin: 0.7 mg/dL (ref 0.3–1.2)
Total Protein: 6 g/dL — ABNORMAL LOW (ref 6.5–8.1)

## 2021-01-23 LAB — HEPATITIS B SURFACE ANTIGEN: Hepatitis B Surface Ag: NONREACTIVE

## 2021-01-23 LAB — HEPATITIS B CORE ANTIBODY, IGM: Hep B C IgM: NONREACTIVE

## 2021-01-23 MED ORDER — AMOXICILLIN-POT CLAVULANATE 875-125 MG PO TABS: 1.0000 | ORAL_TABLET | Freq: Two times a day (BID) | ORAL | Status: DC

## 2021-01-23 MED ORDER — PENTAFLUOROPROP-TETRAFLUOROETH EX AERO
INHALATION_SPRAY | CUTANEOUS | Status: AC
  Filled 2021-01-23: qty 116

## 2021-01-23 MED ORDER — DARBEPOETIN ALFA 100 MCG/0.5ML IJ SOSY
100.0000 ug | PREFILLED_SYRINGE | INTRAMUSCULAR | Status: DC
  Administered 2021-01-23: 100 ug via INTRAVENOUS

## 2021-01-23 MED ORDER — ACETAMINOPHEN 325 MG PO TABS
ORAL_TABLET | ORAL | Status: AC
  Administered 2021-01-23: 650 mg
  Filled 2021-01-23: qty 2

## 2021-01-23 MED ORDER — AMOXICILLIN-POT CLAVULANATE 500-125 MG PO TABS
500.0000 mg | ORAL_TABLET | ORAL | Status: AC
Start: 2021-01-23 — End: 2021-01-25
  Administered 2021-01-23 – 2021-01-25 (×3): 500 mg via ORAL
  Filled 2021-01-23 (×3): qty 1

## 2021-01-23 MED ORDER — DARBEPOETIN ALFA 100 MCG/0.5ML IJ SOSY
PREFILLED_SYRINGE | INTRAMUSCULAR | Status: AC
  Filled 2021-01-23: qty 0.5

## 2021-01-23 NOTE — Progress Notes (Signed)
PROGRESS NOTE  Rebekah Burton [id number] DOB: [birth year] DOA: 01/15/2021 PCP: Trey Sailors, PA   LOS: 8 days   Brief Narrative / Interim history: 45 year old female with ESRD on HD MWF, blindness, hypertension, PAD came into the hospital with a breast abscess.  General surgery consulted, she is status post incision and drainage.  She was also incidental Covid.  Subjective / 24h Interval events: Complains of pain in the left breast where she had I&D  Assessment & Plan: Principal Problem Breast abscess -General surgery consulted, she is status post I&D done by general surgery.  Preliminary cultures with gram-positive's, continue Unasyn.  Transition to Augmentin, cultures are final which shows GPC's and mixed anaerobic flora.  Active Problems Incidental COVID-19-asymptomatic, no evidence of pulmonary involvement.  She had one positive and one negative test in the last few days, repeat the third test was negative, isolation discontinued on 1/25  ESRD-MWF schedule, nephrology consulted and following  Anemia of chronic kidney disease-monitor, hemoglobin stable  Hypertension-continue home medications  Disposition-patient will live with her boyfriend following this hospital stay, emergency room.  TOC working on moving her Medicaid to her future [place].  She will need to have that for transportation to and from dialysis center.  Currently in process  Scheduled Meds: . carvedilol  25 mg Oral BID WC  . Chlorhexidine Gluconate Cloth  6 each Topical Q0600  . Chlorhexidine Gluconate Cloth  6 each Topical Q0600  . Chlorhexidine Gluconate Cloth  6 each Topical Q0600  . cloNIDine  0.3 mg Oral TID  . Darbepoetin Alfa      . darbepoetin (ARANESP) injection - DIALYSIS  100 mcg Intravenous Q Sat-HD  . heparin  5,000 Units Subcutaneous Q8H  . lidocaine-prilocaine  1 application Topical Q M,W,F-HD  . multivitamin  1 tablet Oral QHS  . pentafluoroprop-tetrafluoroeth      . senna  1 tablet Oral  BID  . sevelamer carbonate  3,200 mg Oral TID WC  . sodium chloride flush  3 mL Intravenous Q12H   Continuous Infusions: . sodium chloride    . ampicillin-sulbactam (UNASYN) IV 3 g (01/22/21 2120)   PRN Meds:.sodium chloride, acetaminophen **OR** [DISCONTINUED] acetaminophen, HYDROcodone-acetaminophen, sodium chloride flush  Diet Orders (From admission, onward)    Start     Ordered   01/15/21 2353  Diet renal with fluid restriction Fluid restriction: 1200 mL Fluid; Room service appropriate? Yes; Fluid consistency: Thin  Diet effective now       Question Answer Comment  Fluid restriction: 1200 mL Fluid   Room service appropriate? Yes   Fluid consistency: Thin      01/15/21 2357          DVT prophylaxis: heparin injection 5,000 Units Start: 01/16/21 0600     Code Status: Full Code  Family Communication: no family at bedside   Status is: Inpatient  Remains inpatient appropriate because:Inpatient level of care appropriate due to severity of illness   Dispo: The patient is from: Home              Anticipated d/c is to: Home              Anticipated d/c date is: 1 day              Patient currently is not medically stable to d/c.   Difficult to place patient No  Level of care: Med-Surg  Consultants:  General surgery  Nephrology   Procedures:  I&D  Microbiology  GPC in abscess culture  Antimicrobials: Unasyn     Objective: Vitals:   01/23/21 0750 01/23/21 0830 01/23/21 0900 01/23/21 0930  BP: 132/65 115/67 (!) 100/54 (!) 111/55  Pulse: 60 60 62 60  Resp:    14  Temp:      TempSrc:      SpO2:      Weight:      Height:        Intake/Output Summary (Last 24 hours) at 01/23/2021 1001 Last data filed at 01/23/2021 0601 Gross per 24 hour  Intake 440 ml  Output --  Net 440 ml   Filed Weights   01/20/21 0740 01/20/21 1041 01/23/21 0730  Weight: 108.7 kg 105.9 kg 108.6 kg    Examination:  Constitutional: No distress Eyes: No icterus ENMT:  mmm Neck: normal, supple Respiratory: Lungs are clear bilaterally, no wheezing, no crackles Cardiovascular: Regular rate and rhythm, no murmurs, no peripheral edema Abdomen: Soft, nontender, nondistended, bowel sounds positive Musculoskeletal: no clubbing / cyanosis.  Skin: No rashes seen Neurologic: Nonfocal   Data Reviewed: I have independently reviewed following labs and imaging studies   CBC: Recent Labs  Lab 01/17/21 0204 01/18/21 0136 01/19/21 0038 01/20/21 0020 01/23/21 0426  WBC 6.6 6.4 5.8 5.8 5.6  NEUTROABS 4.9 4.7 4.5 4.3  --   HGB 8.4* 8.2* 8.0* 7.9* 7.9*  HCT 27.5* 27.6* 26.5* 25.2* 25.4*  MCV 100.0 99.3 97.4 96.2 96.2  PLT 315 290 272 261 [id number]   Basic Metabolic Panel: Recent Labs  Lab 01/17/21 0204 01/18/21 0136 01/19/21 0038 01/20/21 0020 01/23/21 0426  NA 138 137 135 134* 134*  K 3.7 3.9 3.3* 4.0 4.8  CL 94* 92* 90* 90* 92*  CO2 '29 30 30 29 25  '$ GLUCOSE 134* 107* 87 88 95  BUN 29* 40* 30* 36* 56*  CREATININE 8.84* 10.94* 8.52* 10.17* 13.43*  CALCIUM 8.9 9.1 8.7* 9.1 9.8  MG 2.0 2.0 1.8 1.8  --   PHOS  --   --   --  5.2*  --    Liver Function Tests: Recent Labs  Lab 01/17/21 0204 01/18/21 0136 01/19/21 0038 01/20/21 0020 01/23/21 0426  AST 11* 11* 16 23 46*  ALT '6 7 11 15 '$ 35  ALKPHOS 35* 35* 36* 37* 42  BILITOT 0.7 0.5 1.0 0.7 0.7  PROT 5.9* 6.0* 6.0* 6.0* 6.0*  ALBUMIN 2.2* 2.2* 2.3* 2.4* 2.6*   Coagulation Profile: No results for input(s): INR, PROTIME in the last 168 hours. HbA1C: No results for input(s): HGBA1C in the last 72 hours. CBG: No results for input(s): GLUCAP in the last 168 hours.  Recent Results (from the past 240 hour(s))  SARS Coronavirus 2 by RT PCR (hospital order, performed in [facility] hospital lab) Nasopharyngeal Nasopharyngeal Swab     Status: Abnormal   Collection Time: 01/15/21  5:53 PM   Specimen: Nasopharyngeal Swab  Result Value Ref Range Status   SARS Coronavirus 2 POSITIVE (A) NEGATIVE Final     Comment: RESULT CALLED TO, READ BACK BY AND VERIFIED WITH: Eustaquio Boyden RN 01/15/21 2209 JDW (NOTE) SARS-CoV-2 target nucleic acids are DETECTED  SARS-CoV-2 RNA is generally detectable in upper respiratory specimens  during the acute phase of infection.  Positive results are indicative  of the presence of the identified virus, but do not rule out bacterial infection or co-infection with other pathogens not detected by the test.  Clinical correlation with patient history and  other diagnostic information is necessary to determine patient infection status.  The expected result is negative.  Fact Sheet for Patients:   StrictlyIdeas.no   Fact Sheet for Healthcare Providers:   BankingDealers.co.za    This test is not yet approved or cleared by the [place] FDA and  has been authorized for detection and/or diagnosis of SARS-CoV-2 by FDA under an Emergency Use Authorization (EUA).  This EUA will remain in effect (meaning this  test can be used) for the duration of  the COVID-19 declaration under Section 564(b)(1) of the Act, 21 U.S.C. section 360-bbb-3(b)(1), unless the authorization is terminated or revoked sooner.  Performed at [facility], [place] [address] [place], [place] [address]   SARS CORONAVIRUS 2 (TAT 6-24 HRS) Nasopharyngeal Nasopharyngeal Swab     Status: None   Collection Time: 01/15/21  5:55 PM   Specimen: Nasopharyngeal Swab  Result Value Ref Range Status   SARS Coronavirus 2 NEGATIVE NEGATIVE Final    Comment: (NOTE) SARS-CoV-2 target nucleic acids are NOT DETECTED.  The SARS-CoV-2 RNA is generally detectable in upper and lower respiratory specimens during the acute phase of infection. Negative results do not preclude SARS-CoV-2 infection, do not rule out co-infections with other pathogens, and should not be used as the sole basis for treatment or other patient management decisions. Negative results must  be combined with clinical observations, patient history, and epidemiological information. The expected result is Negative.  Fact Sheet for Patients: SugarRoll.be  Fact Sheet for Healthcare Providers: https://www.woods-mathews.com/  This test is not yet approved or cleared by the [place] FDA and  has been authorized for detection and/or diagnosis of SARS-CoV-2 by FDA under an Emergency Use Authorization (EUA). This EUA will remain  in effect (meaning this test can be used) for the duration of the COVID-19 declaration under Se ction 564(b)(1) of the Act, 21 U.S.C. section 360bbb-3(b)(1), unless the authorization is terminated or revoked sooner.  Performed at [facility], [place] [address] [place], [place] [address]   Aerobic/Anaerobic Culture (surgical/deep wound)     Status: None   Collection Time: 01/16/21 12:09 AM   Specimen: Abscess  Result Value Ref Range Status   Specimen Description ABSCESS LEFT BREAST  Final   Special Requests NONE  Final   Gram Stain   Final    NO WBC SEEN FEW GRAM POSITIVE COCCI Performed at [facility], [address] [place], [place] [address]    Culture   Final    MIXED ANAEROBIC FLORA PRESENT.  CALL LAB IF FURTHER IID REQUIRED.   Report Status 01/21/2021 FINAL  Final  SARS CORONAVIRUS 2 (TAT 6-24 HRS) Nasopharyngeal Nasopharyngeal Swab     Status: None   Collection Time: 01/18/21  9:13 AM   Specimen: Nasopharyngeal Swab  Result Value Ref Range Status   SARS Coronavirus 2 NEGATIVE NEGATIVE Final    Comment: (NOTE) SARS-CoV-2 target nucleic acids are NOT DETECTED.  The SARS-CoV-2 RNA is generally detectable in upper and lower respiratory specimens during the acute phase of infection. Negative results do not preclude SARS-CoV-2 infection, do not rule out co-infections with other pathogens, and should not be used as the sole basis for treatment or other patient management  decisions. Negative results must be combined with clinical observations, patient history, and epidemiological information. The expected result is Negative.  Fact Sheet for Patients: SugarRoll.be  Fact Sheet for Healthcare Providers: https://www.woods-mathews.com/  This test is not yet approved or cleared by the [place] FDA and  has been authorized for detection and/or diagnosis  of SARS-CoV-2 by FDA under an Emergency Use Authorization (EUA). This EUA will remain  in effect (meaning this test can be used) for the duration of the COVID-19 declaration under Se ction 564(b)(1) of the Act, 21 U.S.C. section 360bbb-3(b)(1), unless the authorization is terminated or revoked sooner.  Performed at [facility], [place] [address] [place], [place] [address]   MRSA PCR Screening     Status: None   Collection Time: 01/19/21  2:16 PM   Specimen: Nasal Mucosa; Nasopharyngeal  Result Value Ref Range Status   MRSA by PCR NEGATIVE NEGATIVE Final    Comment:        The GeneXpert MRSA Assay (FDA approved for NASAL specimens only), is one component of a comprehensive MRSA colonization surveillance program. It is not intended to diagnose MRSA infection nor to guide or monitor treatment for MRSA infections. Performed at [facility], [place] [address] [place], [place] [address]      Radiology Studies: No results found.  Marzetta Board, MD, PhD [facility]  Between 7 am - 7 pm I am available, please contact me via Amion or Securechat  Between 7 pm - 7 am I am not available, please contact night coverage MD/APP via Amion

## 2021-01-23 NOTE — Plan of Care (Signed)
  Problem: Education: Goal: Knowledge of General Education information will improve Description: Including pain rating scale, medication(s)/side effects and non-pharmacologic comfort measures Outcome: Progressing   Problem: Health Behavior/Discharge Planning: Goal: Ability to manage health-related needs will improve Outcome: Progressing   Problem: Clinical Measurements: Goal: Ability to maintain clinical measurements within normal limits will improve Outcome: Progressing Goal: Will remain free from infection Outcome: Progressing Goal: Diagnostic test results will improve Outcome: Progressing Goal: Respiratory complications will improve Outcome: Progressing Goal: Cardiovascular complication will be avoided Outcome: Progressing   Problem: Activity: Goal: Risk for activity intolerance will decrease Outcome: Progressing   Problem: Nutrition: Goal: Adequate nutrition will be maintained Outcome: Progressing   Problem: Coping: Goal: Level of anxiety will decrease Outcome: Progressing   Problem: Elimination: Goal: Will not experience complications related to bowel motility Outcome: Progressing Goal: Will not experience complications related to urinary retention Outcome: Progressing   Problem: Pain Managment: Goal: General experience of comfort will improve Outcome: Progressing   Problem: Safety: Goal: Ability to remain free from injury will improve Outcome: Progressing   Problem: Skin Integrity: Goal: Risk for impaired skin integrity will decrease Outcome: Progressing   Problem: Education: Goal: Knowledge of risk factors and measures for prevention of condition will improve Outcome: Progressing   Problem: Coping: Goal: Psychosocial and spiritual needs will be supported Outcome: Progressing   Problem: Respiratory: Goal: Will maintain a patent airway Outcome: Progressing Goal: Complications related to the disease process, condition or treatment will be avoided or  minimized Outcome: Progressing   Problem: Education: Goal: Knowledge of disease and its progression will improve Outcome: Progressing Goal: Individualized Educational Video(s) Outcome: Progressing   Problem: Fluid Volume: Goal: Compliance with measures to maintain balanced fluid volume will improve Outcome: Progressing   Problem: Health Behavior/Discharge Planning: Goal: Ability to manage health-related needs will improve Outcome: Progressing   Problem: Nutritional: Goal: Ability to make healthy dietary choices will improve Outcome: Progressing   Problem: Clinical Measurements: Goal: Complications related to the disease process, condition or treatment will be avoided or minimized Outcome: Progressing

## 2021-01-23 NOTE — Progress Notes (Signed)
[facility] Progress Note   Subjective: Seen on HD, high arterial pressure, having issues maintaining BFR. Recent intervention at [facility] with stent placement. Adjusted needle-should be OK. No C/Os. Redosing ESA today.   Objective Vitals:   01/22/21 1026 01/22/21 1823 01/22/21 2129 01/23/21 0600  BP: 135/69 121/72 134/80 128/84  Pulse: 69 74 76 70  Resp: '14 16 20 20  '$ Temp: 98.6 F (37 C) 98.6 F (37 C) 98 F (36.7 C) 98.7 F (37.1 C)  TempSrc: Oral Oral Oral Oral  SpO2: 99% 96% 98% 99%  Weight:      Height:       Physical Exam General: obese female in NAD Heart: S1,S2 no M/R/G Lungs: CTAB A/P Abdomen: S, NT Extremities: no LE edema Dialysis Access: L AVF + bruit   Additional Objective Labs: Basic Metabolic Panel: Recent Labs  Lab 01/19/21 0038 01/20/21 0020 01/23/21 0426  NA 135 134* 134*  K 3.3* 4.0 4.8  CL 90* 90* 92*  CO2 '30 29 25  '$ GLUCOSE 87 88 95  BUN 30* 36* 56*  CREATININE 8.52* 10.17* 13.43*  CALCIUM 8.7* 9.1 9.8  PHOS  --  5.2*  --    Liver Function Tests: Recent Labs  Lab 01/19/21 0038 01/20/21 0020 01/23/21 0426  AST 16 23 46*  ALT 11 15 35  ALKPHOS 36* 37* 42  BILITOT 1.0 0.7 0.7  PROT 6.0* 6.0* 6.0*  ALBUMIN 2.3* 2.4* 2.6*   No results for input(s): LIPASE, AMYLASE in the last 168 hours. CBC: Recent Labs  Lab 01/17/21 0204 01/18/21 0136 01/19/21 0038 01/20/21 0020 01/23/21 0426  WBC 6.6 6.4 5.8 5.8 5.6  NEUTROABS 4.9 4.7 4.5 4.3  --   HGB 8.4* 8.2* 8.0* 7.9* 7.9*  HCT 27.5* 27.6* 26.5* 25.2* 25.4*  MCV 100.0 99.3 97.4 96.2 96.2  PLT 315 290 272 261 244   Blood Culture    Component Value Date/Time   SDES ABSCESS LEFT BREAST 01/16/2021 0009   SPECREQUEST NONE 01/16/2021 0009   CULT  01/16/2021 0009    MIXED ANAEROBIC FLORA PRESENT.  CALL LAB IF FURTHER IID REQUIRED.   REPTSTATUS 01/21/2021 FINAL 01/16/2021 0009    Cardiac Enzymes: No results for input(s): CKTOTAL, CKMB, CKMBINDEX, TROPONINI in the last 168  hours. CBG: No results for input(s): GLUCAP in the last 168 hours. Iron Studies: No results for input(s): IRON, TIBC, TRANSFERRIN, FERRITIN in the last 72 hours. '@lablastinr3'$ @ Studies/Results: No results found. Medications: . sodium chloride    . ampicillin-sulbactam (UNASYN) IV 3 g (01/22/21 2120)   . carvedilol  25 mg Oral BID WC  . Chlorhexidine Gluconate Cloth  6 each Topical Q0600  . Chlorhexidine Gluconate Cloth  6 each Topical Q0600  . Chlorhexidine Gluconate Cloth  6 each Topical Q0600  . cloNIDine  0.3 mg Oral TID  . darbepoetin (ARANESP) injection - DIALYSIS  100 mcg Intravenous Q Sat-HD  . heparin  5,000 Units Subcutaneous Q8H  . lidocaine-prilocaine  1 application Topical Q M,W,F-HD  . multivitamin  1 tablet Oral QHS  . pentafluoroprop-tetrafluoroeth      . senna  1 tablet Oral BID  . sevelamer carbonate  3,200 mg Oral TID WC  . sodium chloride flush  3 mL Intravenous Q12H     Dialysis Orders: MWF  4h 5mn 2/2.5 bath 105kg AVF Heparin ? parsabiv 15 mg three times a week with HD mircera 150 mcg every 2 weeks - last given 01/13/21 hectorol 3 mcg three times a week  Assessment/Plan: #End-stage renal disease - Tolerating dialysis well - HD per MWF schedule.HD off schedule today D/T high census/staffing issues. Will go back to MWF if possible 01/25/21.   # Left breast abscess -S/p I&D by general surgery.Antibiotics per primary team -off of morphine now, please avoid use in ESRD pts. Pain management per primary.   # covid 19 infection  - Therapies per primary team. Off covid isolation s/p two negative tests - Can return to her regular outpatient HD seat at discharge  # Acute hypoxic resp failure  - supportive care including oxygen  - optimize volume status as able with HD  - covid management as above -symptoms resolved  # HTN  - acceptable control - much better control here than outpatient and pt agrees she feels better with normal BP.  Volume and BP well controlled today. Emphasized the importance of med compliance at discharge  # Anemia CKD -Hgb7.9. -ESA01/19/22. HGB 7.9. Re-dose ESA today-Aranesp 100 mcg IV.  - No IV Fe given acute infection - Transfuse PRN  # Metabolic bone disease -Phos at goal. -Corrected calcium slightly high, hectorol on hold - not able to get parsabiv here  # Social Issues -Planning on moving to [place], need HD unit in [place]. Will notify renal navigator.   Jafari Mckillop H. Velvie Thomaston NP-C 01/23/2021, 8:14 AM  [employer] [phone]

## 2021-01-24 DIAGNOSIS — N186 End stage renal disease: Secondary | ICD-10-CM | POA: Diagnosis not present

## 2021-01-24 DIAGNOSIS — N611 Abscess of the breast and nipple: Secondary | ICD-10-CM | POA: Diagnosis not present

## 2021-01-24 DIAGNOSIS — U071 COVID-19: Secondary | ICD-10-CM | POA: Diagnosis not present

## 2021-01-24 LAB — CBC
HCT: 26.6 % — ABNORMAL LOW (ref 36.0–46.0)
Hemoglobin: 8.1 g/dL — ABNORMAL LOW (ref 12.0–15.0)
MCH: 29.8 pg (ref 26.0–34.0)
MCHC: 30.5 g/dL (ref 30.0–36.0)
MCV: 97.8 fL (ref 80.0–100.0)
Platelets: 236 10*3/uL (ref 150–400)
RBC: 2.72 MIL/uL — ABNORMAL LOW (ref 3.87–5.11)
RDW: 16.2 % — ABNORMAL HIGH (ref 11.5–15.5)
WBC: 5.8 10*3/uL (ref 4.0–10.5)
nRBC: 0 % (ref 0.0–0.2)

## 2021-01-24 LAB — BASIC METABOLIC PANEL
Anion gap: 13 (ref 5–15)
BUN: 36 mg/dL — ABNORMAL HIGH (ref 6–20)
CO2: 26 mmol/L (ref 22–32)
Calcium: 9.4 mg/dL (ref 8.9–10.3)
Chloride: 95 mmol/L — ABNORMAL LOW (ref 98–111)
Creatinine, Ser: 9.93 mg/dL — ABNORMAL HIGH (ref 0.44–1.00)
GFR, Estimated: 5 mL/min — ABNORMAL LOW (ref >60)
Glucose, Bld: 86 mg/dL (ref 70–99)
Potassium: 4.2 mmol/L (ref 3.5–5.1)
Sodium: 134 mmol/L — ABNORMAL LOW (ref 135–145)

## 2021-01-24 NOTE — Progress Notes (Signed)
[facility] Progress Note   Subjective: Seen in room, still C/O breast tenderness but feels better.   HD off schedule 01/23/21 next HD tomorrow. Had issues with high arterial pressure D/T needle hitting edge of stent 01/23/21. Improved with adjustment of needle.   Objective Vitals:   01/23/21 2201 01/24/21 0000 01/24/21 0436 01/24/21 1004  BP: 106/62 132/68 (!) 146/89 (!) 126/91  Pulse:  62 68 68  Resp:  16  18  Temp:  98 F (36.7 C)  98 F (36.7 C)  TempSrc:  Oral Oral Oral  SpO2:  99% 100% 100%  Weight:      Height:       Physical Exam General: obese female in NAD Heart: S1,S2 no M/R/G Lungs: CTAB A/P Abdomen: S, NT Extremities: no LE edema Dialysis Access: L AVF + bruit   Additional Objective Labs: Basic Metabolic Panel: Recent Labs  Lab 01/20/21 0020 01/23/21 0426 01/24/21 0412  NA 134* 134* 134*  K 4.0 4.8 4.2  CL 90* 92* 95*  CO2 '29 25 26  '$ GLUCOSE 88 95 86  BUN 36* 56* 36*  CREATININE 10.17* 13.43* 9.93*  CALCIUM 9.1 9.8 9.4  PHOS 5.2*  --   --    Liver Function Tests: Recent Labs  Lab 01/19/21 0038 01/20/21 0020 01/23/21 0426  AST 16 23 46*  ALT 11 15 35  ALKPHOS 36* 37* 42  BILITOT 1.0 0.7 0.7  PROT 6.0* 6.0* 6.0*  ALBUMIN 2.3* 2.4* 2.6*   No results for input(s): LIPASE, AMYLASE in the last 168 hours. CBC: Recent Labs  Lab 01/18/21 0136 01/19/21 0038 01/20/21 0020 01/23/21 0426 01/24/21 0412  WBC 6.4 5.8 5.8 5.6 5.8  NEUTROABS 4.7 4.5 4.3  --   --   HGB 8.2* 8.0* 7.9* 7.9* 8.1*  HCT 27.6* 26.5* 25.2* 25.4* 26.6*  MCV 99.3 97.4 96.2 96.2 97.8  PLT 290 272 261 244 236   Blood Culture    Component Value Date/Time   SDES ABSCESS LEFT BREAST 01/16/2021 0009   SPECREQUEST NONE 01/16/2021 0009   CULT  01/16/2021 0009    MIXED ANAEROBIC FLORA PRESENT.  CALL LAB IF FURTHER IID REQUIRED.   REPTSTATUS 01/21/2021 FINAL 01/16/2021 0009    Cardiac Enzymes: No results for input(s): CKTOTAL, CKMB, CKMBINDEX, TROPONINI  in the last 168 hours. CBG: No results for input(s): GLUCAP in the last 168 hours. Iron Studies: No results for input(s): IRON, TIBC, TRANSFERRIN, FERRITIN in the last 72 hours. '@lablastinr3'$ @ Studies/Results: No results found. Medications: . sodium chloride     . amoxicillin-clavulanate  500 mg Oral Q24H  . carvedilol  25 mg Oral BID WC  . Chlorhexidine Gluconate Cloth  6 each Topical Q0600  . Chlorhexidine Gluconate Cloth  6 each Topical Q0600  . Chlorhexidine Gluconate Cloth  6 each Topical Q0600  . cloNIDine  0.3 mg Oral TID  . darbepoetin (ARANESP) injection - DIALYSIS  100 mcg Intravenous Q Sat-HD  . heparin  5,000 Units Subcutaneous Q8H  . lidocaine-prilocaine  1 application Topical Q M,W,F-HD  . multivitamin  1 tablet Oral QHS  . senna  1 tablet Oral BID  . sevelamer carbonate  3,200 mg Oral TID WC  . sodium chloride flush  3 mL Intravenous Q12H     Dialysis Orders: MWF  4h 78mn 2/2.5 bath 105kg AVF Heparin ? parsabiv 15 mg three times a week with HD mircera 150 mcg every 2 weeks - last given 01/13/21 hectorol 3 mcg three times a  week  Assessment/Plan: #End-stage renal disease - Tolerating dialysis well - HD per MWF schedule.HD off schedule today D/T high census/staffing issues. Will go back to MWF if possible 01/25/21.   # Left breast abscess -S/p I&D by general surgery.Antibiotics per primary team -off of morphine now, please avoid use in ESRD pts. Pain management per primary.  # covid 19 infection  - Therapies per primary team. Off covid isolation s/p two negative tests - Can return to her regular outpatient HD seat at discharge  # Acute hypoxic resp failure  - supportive care including oxygen  - optimize volume status as able with HD  - covid management as above -symptoms resolved  # HTN  - acceptable control - much better control here than outpatient and pt agrees she feels better with normal BP. HD 01/29 only had Net UF 1 L. Still 3  kg above EDW. No evidence of volume excess by exam.  Standing wt with HD tomorrow. Try to lower volume but EDW may need adjusting.  # Anemia CKD -Hgb7.9. -ESA01/19/22. HGB 7.9. Re-dose ESA today-Aranesp 100 mcg IV.  - No IV Fe given acute infection - Transfuse PRN  # Metabolic bone disease -Phos at goal. -Corrected calcium slightly high, hectorol on hold - not able to get parsabiv here  # Social Issues -Planning on moving to [place], need HD unit in [place]. Will notify renal navigator.   Rita H. Brown NP-C 01/24/2021, 12:25 PM  [employer] [phone]

## 2021-01-24 NOTE — Progress Notes (Signed)
PROGRESS NOTE  Rebekah Burton [id number] DOB: May 04, 1976 DOA: 01/15/2021 PCP: Trey Sailors, PA   LOS: 9 days   Brief Narrative / Interim history: 45 year old female with ESRD on HD MWF, blindness, hypertension, PAD came into the hospital with a breast abscess.  General surgery consulted, she is status post incision and drainage.  She was also incidental Covid, now off contact precautions  Subjective / 24h Interval events: Some soreness in the left breast where she has been I&D, no fever, no chills  Assessment & Plan: Principal Problem Breast abscess -General surgery consulted, she is status post I&D done by general surgery.  She was maintained on Unasyn, preliminary cultures show gram-positive, culture finalized with GPC's and mixed anaerobic flora.  She was transitioned to Augmentin  Active Problems Incidental COVID-19-asymptomatic, no evidence of pulmonary involvement.  She had one positive and one negative test in the last few days, repeat the third test was negative, isolation discontinued on 1/25  ESRD-MWF schedule, nephrology consulted and following  Anemia of chronic kidney disease-monitor, hemoglobin stable  Hypertension-continue home medications  Disposition-patient will live with her boyfriend following this hospital stay, emergency room.  TOC working on moving her Medicaid from [employer] to [facility].  She will need to have that for transportation to and from dialysis center.  Currently in process, hold 1 dose arranged  Scheduled Meds: . amoxicillin-clavulanate  500 mg Oral Q24H  . carvedilol  25 mg Oral BID WC  . Chlorhexidine Gluconate Cloth  6 each Topical Q0600  . Chlorhexidine Gluconate Cloth  6 each Topical Q0600  . Chlorhexidine Gluconate Cloth  6 each Topical Q0600  . cloNIDine  0.3 mg Oral TID  . darbepoetin (ARANESP) injection - DIALYSIS  100 mcg Intravenous Q Sat-HD  . heparin  5,000 Units Subcutaneous Q8H  . lidocaine-prilocaine  1 application  Topical Q M,W,F-HD  . multivitamin  1 tablet Oral QHS  . senna  1 tablet Oral BID  . sevelamer carbonate  3,200 mg Oral TID WC  . sodium chloride flush  3 mL Intravenous Q12H   Continuous Infusions: . sodium chloride     PRN Meds:.sodium chloride, acetaminophen **OR** [DISCONTINUED] acetaminophen, HYDROcodone-acetaminophen, sodium chloride flush  Diet Orders (From admission, onward)    Start     Ordered   01/15/21 2353  Diet renal with fluid restriction Fluid restriction: 1200 mL Fluid; Room service appropriate? Yes; Fluid consistency: Thin  Diet effective now       Question Answer Comment  Fluid restriction: 1200 mL Fluid   Room service appropriate? Yes   Fluid consistency: Thin      01/15/21 2357          DVT prophylaxis: heparin injection 5,000 Units Start: 01/16/21 0600     Code Status: Full Code  Family Communication: no family at bedside   Status is: Inpatient  Remains inpatient appropriate because:Inpatient level of care appropriate due to severity of illness   Dispo: The patient is from: Home              Anticipated d/c is to: Home              Anticipated d/c date is: 1 day              Patient currently is not medically stable to d/c.   Difficult to place patient No  Level of care: Med-Surg  Consultants:  General surgery  Nephrology   Procedures:  I&D  Microbiology  GPC in abscess culture  Antimicrobials: Unasyn     Objective: Vitals:   01/23/21 2025 01/23/21 2201 01/24/21 0000 01/24/21 0436  BP: 114/63 106/62 132/68 (!) 146/89  Pulse: 67  62 68  Resp:   16   Temp: 98.1 F (36.7 C)  98 F (36.7 C)   TempSrc: Oral  Oral Oral  SpO2: 99%  99% 100%  Weight:      Height:        Intake/Output Summary (Last 24 hours) at 01/24/2021 0917 Last data filed at 01/24/2021 0610 Gross per 24 hour  Intake 518 ml  Output 1016 ml  Net -498 ml   Filed Weights   01/20/21 1041 01/23/21 0730 01/23/21 1045  Weight: 105.9 kg 108.6 kg 108 kg     Examination:  Constitutional: No distress Eyes: No icterus ENMT: mmm Neck: normal, supple Respiratory: Clear bilaterally, no wheezing Cardiovascular: Regular rate and rhythm, no murmurs, no edema Abdomen: Soft, NT, ND, bowel sounds positive Musculoskeletal: no clubbing / cyanosis.  Skin: No rashes seen, dressing left breast CDI Neurologic: No focal deficits   Data Reviewed: I have independently reviewed following labs and imaging studies   CBC: Recent Labs  Lab 01/18/21 0136 01/19/21 0038 01/20/21 0020 01/23/21 0426 01/24/21 0412  WBC 6.4 5.8 5.8 5.6 5.8  NEUTROABS 4.7 4.5 4.3  --   --   HGB 8.2* 8.0* 7.9* 7.9* 8.1*  HCT 27.6* 26.5* 25.2* 25.4* 26.6*  MCV 99.3 97.4 96.2 96.2 97.8  PLT 290 272 261 244 [id number]   Basic Metabolic Panel: Recent Labs  Lab 01/18/21 0136 01/19/21 0038 01/20/21 0020 01/23/21 0426 01/24/21 0412  NA 137 135 134* 134* 134*  K 3.9 3.3* 4.0 4.8 4.2  CL 92* 90* 90* 92* 95*  CO2 '30 30 29 25 26  '$ GLUCOSE 107* 87 88 95 86  BUN 40* 30* 36* 56* 36*  CREATININE 10.94* 8.52* 10.17* 13.43* 9.93*  CALCIUM 9.1 8.7* 9.1 9.8 9.4  MG 2.0 1.8 1.8  --   --   PHOS  --   --  5.2*  --   --    Liver Function Tests: Recent Labs  Lab 01/18/21 0136 01/19/21 0038 01/20/21 0020 01/23/21 0426  AST 11* 16 23 46*  ALT '7 11 15 '$ 35  ALKPHOS 35* 36* 37* 42  BILITOT 0.5 1.0 0.7 0.7  PROT 6.0* 6.0* 6.0* 6.0*  ALBUMIN 2.2* 2.3* 2.4* 2.6*   Coagulation Profile: No results for input(s): INR, PROTIME in the last 168 hours. HbA1C: No results for input(s): HGBA1C in the last 72 hours. CBG: No results for input(s): GLUCAP in the last 168 hours.  Recent Results (from the past 240 hour(s))  SARS Coronavirus 2 by RT PCR (hospital order, performed in [facility] hospital lab) Nasopharyngeal Nasopharyngeal Swab     Status: Abnormal   Collection Time: 01/15/21  5:53 PM   Specimen: Nasopharyngeal Swab  Result Value Ref Range Status   SARS Coronavirus 2 POSITIVE (A)  NEGATIVE Final    Comment: RESULT CALLED TO, READ BACK BY AND VERIFIED WITH: Eustaquio Boyden RN 01/15/21 2209 JDW (NOTE) SARS-CoV-2 target nucleic acids are DETECTED  SARS-CoV-2 RNA is generally detectable in upper respiratory specimens  during the acute phase of infection.  Positive results are indicative  of the presence of the identified virus, but do not rule out bacterial infection or co-infection with other pathogens not detected by the test.  Clinical correlation with patient history and  other diagnostic information is necessary to determine patient  infection status.  The expected result is negative.  Fact Sheet for Patients:   StrictlyIdeas.no   Fact Sheet for Healthcare Providers:   BankingDealers.co.za    This test is not yet approved or cleared by the [place] FDA and  has been authorized for detection and/or diagnosis of SARS-CoV-2 by FDA under an Emergency Use Authorization (EUA).  This EUA will remain in effect (meaning this  test can be used) for the duration of  the COVID-19 declaration under Section 564(b)(1) of the Act, 21 U.S.C. section 360-bbb-3(b)(1), unless the authorization is terminated or revoked sooner.  Performed at [facility], [place] [address], [place], [place] [address]   SARS CORONAVIRUS 2 (TAT 6-24 HRS) Nasopharyngeal Nasopharyngeal Swab     Status: None   Collection Time: 01/15/21  5:55 PM   Specimen: Nasopharyngeal Swab  Result Value Ref Range Status   SARS Coronavirus 2 NEGATIVE NEGATIVE Final    Comment: (NOTE) SARS-CoV-2 target nucleic acids are NOT DETECTED.  The SARS-CoV-2 RNA is generally detectable in upper and lower respiratory specimens during the acute phase of infection. Negative results do not preclude SARS-CoV-2 infection, do not rule out co-infections with other pathogens, and should not be used as the sole basis for treatment or other patient management  decisions. Negative results must be combined with clinical observations, patient history, and epidemiological information. The expected result is Negative.  Fact Sheet for Patients: SugarRoll.be  Fact Sheet for Healthcare Providers: https://www.woods-mathews.com/  This test is not yet approved or cleared by the [place] FDA and  has been authorized for detection and/or diagnosis of SARS-CoV-2 by FDA under an Emergency Use Authorization (EUA). This EUA will remain  in effect (meaning this test can be used) for the duration of the COVID-19 declaration under Se ction 564(b)(1) of the Act, 21 U.S.C. section 360bbb-3(b)(1), unless the authorization is terminated or revoked sooner.  Performed at [facility], [place] [address], [place], [place] [address]   Aerobic/Anaerobic Culture (surgical/deep wound)     Status: None   Collection Time: 01/16/21 12:09 AM   Specimen: Abscess  Result Value Ref Range Status   Specimen Description ABSCESS LEFT BREAST  Final   Special Requests NONE  Final   Gram Stain   Final    NO WBC SEEN FEW GRAM POSITIVE COCCI Performed at [facility], [address], [place], [place] [address]    Culture   Final    MIXED ANAEROBIC FLORA PRESENT.  CALL LAB IF FURTHER IID REQUIRED.   Report Status 01/21/2021 FINAL  Final  SARS CORONAVIRUS 2 (TAT 6-24 HRS) Nasopharyngeal Nasopharyngeal Swab     Status: None   Collection Time: 01/18/21  9:13 AM   Specimen: Nasopharyngeal Swab  Result Value Ref Range Status   SARS Coronavirus 2 NEGATIVE NEGATIVE Final    Comment: (NOTE) SARS-CoV-2 target nucleic acids are NOT DETECTED.  The SARS-CoV-2 RNA is generally detectable in upper and lower respiratory specimens during the acute phase of infection. Negative results do not preclude SARS-CoV-2 infection, do not rule out co-infections with other pathogens, and should not be used as the sole basis for treatment or other  patient management decisions. Negative results must be combined with clinical observations, patient history, and epidemiological information. The expected result is Negative.  Fact Sheet for Patients: SugarRoll.be  Fact Sheet for Healthcare Providers: https://www.woods-mathews.com/  This test is not yet approved or cleared by the [place] FDA and  has been authorized for  detection and/or diagnosis of SARS-CoV-2 by FDA under an Emergency Use Authorization (EUA). This EUA will remain  in effect (meaning this test can be used) for the duration of the COVID-19 declaration under Se ction 564(b)(1) of the Act, 21 U.S.C. section 360bbb-3(b)(1), unless the authorization is terminated or revoked sooner.  Performed at [facility], [place] [address], [place], [place] [address]   MRSA PCR Screening     Status: None   Collection Time: 01/19/21  2:16 PM   Specimen: Nasal Mucosa; Nasopharyngeal  Result Value Ref Range Status   MRSA by PCR NEGATIVE NEGATIVE Final    Comment:        The GeneXpert MRSA Assay (FDA approved for NASAL specimens only), is one component of a comprehensive MRSA colonization surveillance program. It is not intended to diagnose MRSA infection nor to guide or monitor treatment for MRSA infections. Performed at [facility], [place] [address] [place], [place] [address]      Radiology Studies: No results found.  Marzetta Board, MD, PhD [facility]  Between 7 am - 7 pm I am available, please contact me via Amion or Securechat  Between 7 pm - 7 am I am not available, please contact night coverage MD/APP via Amion

## 2021-01-25 ENCOUNTER — Inpatient Hospital Stay (HOSPITAL_COMMUNITY): Payer: Medicare HMO

## 2021-01-25 DIAGNOSIS — N611 Abscess of the breast and nipple: Secondary | ICD-10-CM | POA: Diagnosis not present

## 2021-01-25 DIAGNOSIS — U071 COVID-19: Secondary | ICD-10-CM | POA: Diagnosis not present

## 2021-01-25 LAB — CBC
HCT: 26.7 % — ABNORMAL LOW (ref 36.0–46.0)
Hemoglobin: 8.1 g/dL — ABNORMAL LOW (ref 12.0–15.0)
MCH: 29.5 pg (ref 26.0–34.0)
MCHC: 30.3 g/dL (ref 30.0–36.0)
MCV: 97.1 fL (ref 80.0–100.0)
Platelets: 217 10*3/uL (ref 150–400)
RBC: 2.75 MIL/uL — ABNORMAL LOW (ref 3.87–5.11)
RDW: 16.4 % — ABNORMAL HIGH (ref 11.5–15.5)
WBC: 6.2 10*3/uL (ref 4.0–10.5)
nRBC: 0 % (ref 0.0–0.2)

## 2021-01-25 LAB — BASIC METABOLIC PANEL
Anion gap: 15 (ref 5–15)
BUN: 48 mg/dL — ABNORMAL HIGH (ref 6–20)
CO2: 25 mmol/L (ref 22–32)
Calcium: 9.6 mg/dL (ref 8.9–10.3)
Chloride: 93 mmol/L — ABNORMAL LOW (ref 98–111)
Creatinine, Ser: 12.01 mg/dL — ABNORMAL HIGH (ref 0.44–1.00)
GFR, Estimated: 4 mL/min — ABNORMAL LOW (ref >60)
Glucose, Bld: 108 mg/dL — ABNORMAL HIGH (ref 70–99)
Potassium: 4.1 mmol/L (ref 3.5–5.1)
Sodium: 133 mmol/L — ABNORMAL LOW (ref 135–145)

## 2021-01-25 LAB — HEPATITIS B E ANTIBODY: Hep B E Ab: NEGATIVE

## 2021-01-25 MED ORDER — MIDAZOLAM HCL 2 MG/2ML IJ SOLN
INTRAMUSCULAR | Status: AC | PRN
  Administered 2021-01-25: 1 mg via INTRAVENOUS

## 2021-01-25 MED ORDER — HEPARIN SODIUM (PORCINE) 5000 UNIT/ML IJ SOLN
5000.0000 [IU] | Freq: Three times a day (TID) | INTRAMUSCULAR | Status: DC
  Administered 2021-01-26 – 2021-02-19 (×9): 5000 [IU] via SUBCUTANEOUS
  Filled 2021-01-25 (×43): qty 1

## 2021-01-25 MED ORDER — THROMBIN FOR PERCUTANEOUS TREATMENT OF PSEUDOANEURYSM (5000UNITS/10ML)
PERCUTANEOUS | Status: AC | PRN
  Administered 2021-01-25: 5000 [IU] via PERCUTANEOUS

## 2021-01-25 MED ORDER — FENTANYL CITRATE (PF) 100 MCG/2ML IJ SOLN
INTRAMUSCULAR | Status: AC
  Filled 2021-01-25: qty 2

## 2021-01-25 MED ORDER — THROMBIN FOR PERCUTANEOUS TREATMENT OF PSEUDOANEURYSM (5000UNITS/10ML)
Freq: Once | PERCUTANEOUS | Status: DC
  Filled 2021-01-25: qty 1

## 2021-01-25 MED ORDER — IOHEXOL 300 MG/ML  SOLN
100.0000 mL | Freq: Once | INTRAMUSCULAR | Status: AC | PRN
  Administered 2021-01-25: 40 mL via INTRA_ARTERIAL

## 2021-01-25 MED ORDER — LIDOCAINE-EPINEPHRINE 1 %-1:100000 IJ SOLN
INTRAMUSCULAR | Status: AC
  Filled 2021-01-25: qty 1

## 2021-01-25 MED ORDER — MIDAZOLAM HCL 2 MG/2ML IJ SOLN
INTRAMUSCULAR | Status: AC
  Filled 2021-01-25: qty 2

## 2021-01-25 NOTE — Consult Note (Addendum)
Chief Complaint: Patient was seen in consultation today for left arm dialysis fistula evaluation and possible thrombolysis Chief Complaint  Patient presents with  . Abscess   at the request of Dr Marval Regal   Supervising Physician: Ruthann Cancer  Patient Status: [facility] - In-pt  History of Present Illness: Rebekah Burton is a 45 y.o. female   ESRD; breast abscess --- post I/D 01/15/21- Dr Ayesha Rumpf L AVF dislysis access Was being used without problem til just today Last intervention in IR 10/23/20 Has done well since then  Was unable to cannulize today in dialysis---- no dialysis today  Request per Nephrology to evaluate fistula and perform intervention if needed.    Past Medical History:  Diagnosis Date  . Blind   . ESRD (end stage renal disease) ([facility])   . Foot ulceration ([place]) 12/2018  . Hypertension   . PAD (peripheral artery disease) ([place])     Past Surgical History:  Procedure Laterality Date  . ABDOMINAL AORTOGRAM W/LOWER EXTREMITY Bilateral 01/17/2019   Procedure: ABDOMINAL AORTOGRAM W/LOWER EXTREMITY;  Surgeon: Waynetta Sandy, MD;  Location: [facility];  Service: Cardiovascular;  Laterality: Bilateral;  . ABDOMINAL HYSTERECTOMY    . ENDARTERECTOMY FEMORAL Left 01/18/2019   Procedure: ENDARTERECTOMY FEMORAL with Perfundaplasty;  Surgeon: Rosetta Posner, MD;  Location: [facility];  Service: Vascular;  Laterality: Left;  . FEMORAL-POPLITEAL BYPASS GRAFT Left 01/18/2019   Procedure: Left  FEMORAL- to  Below the Knee POPLITEAL ARTERY bypass using reversed safenous vein.;  Surgeon: Rosetta Posner, MD;  Location: [facility];  Service: Vascular;  Laterality: Left;  . INCISION AND DRAINAGE ABSCESS Left 01/15/2021   Procedure: INCISION AND DRAINAGE BREAST ABSCESS;  Surgeon: Dwan Bolt, MD;  Location: [facility];  Service: General;  Laterality: Left;  . IR THROMBECTOMY AV FISTULA W/THROMBOLYSIS/PTA/STENT INC/SHUNT/IMG LT Left 10/23/2020  . toe removal Right    All toes  on right foot have been removed.   . TRANSMETATARSAL AMPUTATION Left 01/18/2019   Procedure: TRANSMETATARSAL AMPUTATION;  Surgeon: Rosetta Posner, MD;  Location: [facility];  Service: Vascular;  Laterality: Left;    Allergies: Percocet [oxycodone-acetaminophen]  Medications: Prior to Admission medications   Medication Sig Start Date End Date Taking? Authorizing Provider  aspirin 81 MG EC tablet Take 81 mg by mouth daily. 02/11/19  Yes [provider]  b complex-vitamin c-folic acid (NEPHRO-VITE) 0.8 MG TABS tablet Take 1 tablet by mouth every Monday, Wednesday, and Friday with hemodialysis. 11/20/18  Yes [provider]  carvedilol (COREG) 25 MG tablet Take 25 mg by mouth 2 (two) times daily. 01/04/19  Yes [provider]  cloNIDine (CATAPRES) 0.3 MG tablet Take 0.3 mg by mouth 3 (three) times daily. 01/01/21  Yes [provider]  lidocaine-prilocaine (EMLA) cream Apply 1 application topically every Monday, Wednesday, and Friday with hemodialysis.  11/20/18  Yes [provider]  RENVELA 800 MG tablet Take 3,200 mg by mouth 3 (three) times daily with meals. 11/20/18  Yes [provider]  tiZANidine (ZANAFLEX) 4 MG tablet Take 4 mg by mouth as needed for muscle spasms. 10/06/20  Yes [provider]  amoxicillin-clavulanate (AUGMENTIN) 875-125 MG tablet Take 1 tablet by mouth every 12 (twelve) hours. Patient not taking: Reported on 01/16/2021 01/05/21   Suella Broad A, PA-C  oxyCODONE (OXY IR/ROXICODONE) 5 MG immediate release tablet Take 1 tablet (5 mg total) by mouth every 4 (four) hours as needed for moderate pain. Patient not taking: No sig reported  02/09/19   Elgergawy, Silver Huguenin, MD  traMADol (ULTRAM) 50 MG tablet Take 1 tablet (50 mg total) by mouth every 6 (six) hours as needed for moderate pain. Patient not taking: No sig reported 02/09/19   Elgergawy, Silver Huguenin, MD     Family History  Problem Relation Age of Onset  . Peripheral Artery  Disease Neg Hx   . Kidney failure Neg Hx     Social History   Socioeconomic History  . Marital status: Single    Spouse name: Not on file  . Number of children: Not on file  . Years of education: Not on file  . Highest education level: Not on file  Occupational History  . Not on file  Tobacco Use  . Smoking status: Former Smoker    Packs/day: 0.50    Years: 20.00    Pack years: 10.00    Types: Cigarettes    Quit date: 01/14/2019    Years since quitting: 2.0  . Smokeless tobacco: Never Used  . Tobacco comment: <1 PPD  Vaping Use  . Vaping Use: Never used  Substance and Sexual Activity  . Alcohol use: Never  . Drug use: Never  . Sexual activity: Never  Other Topics Concern  . Not on file  Social History Narrative  . Not on file   Social Determinants of Health   Financial Resource Strain: Not on file  Food Insecurity: Not on file  Transportation Needs: Not on file  Physical Activity: Not on file  Stress: Not on file  Social Connections: Not on file    Review of Systems: A 12 point ROS discussed and pertinent positives are indicated in the HPI above.  All other systems are negative.  Review of Systems  Constitutional: Negative for activity change and fatigue.  Respiratory: Negative for cough and choking.   Cardiovascular: Negative for chest pain.  Gastrointestinal: Negative for abdominal pain.  Psychiatric/Behavioral: Negative for behavioral problems and confusion.    Vital Signs: BP (!) 110/59   Pulse 64   Temp 98.9 F (37.2 C)   Resp 18   Ht '5\' 4"'$  (1.626 m)   Wt 244 lb 11.4 oz (111 kg)   SpO2 99%   BMI 42.00 kg/m   Physical Exam Vitals reviewed.  Cardiovascular:     Rate and Rhythm: Normal rate and regular rhythm.     Heart sounds: Normal heart sounds.  Pulmonary:     Effort: Pulmonary effort is normal.     Breath sounds: Normal breath sounds.  Abdominal:     Palpations: Abdomen is soft.  Musculoskeletal:        General: Normal range of  motion.     Comments: Left arm dialysis fistula bandaged after dialysis attempt this am +thrill +pulse distally  Skin:    General: Skin is warm.  Neurological:     Mental Status: She is alert and oriented to person, place, and time.  Psychiatric:        Behavior: Behavior normal.     Imaging: CT Chest W Contrast  Result Date: 01/05/2021 CLINICAL DATA:  Soft tissue mass, breast mass versus infection EXAM: CT CHEST WITH CONTRAST TECHNIQUE: Multidetector CT imaging of the chest was performed during intravenous contrast administration. CONTRAST:  164m OMNIPAQUE IOHEXOL 300 MG/ML  SOLN COMPARISON:  None. FINDINGS: Cardiovascular: Aortic atherosclerosis. Normal heart size. Three-vessel coronary artery calcifications and/or stents. No pericardial effusion. The central superior vena cava is narrowed and effaced with intraluminal calcifications and extensive superficial vascular  collateralization about the chest wall (series 3, image 43, series 6, image 75). Mediastinum/Nodes: No enlarged mediastinal or hilar lymph nodes. Enlarged left axillary and subpectoral lymph nodes measuring up to 2.0 x 1.2 cm (series 3, image 25). Thyroid gland, trachea, and esophagus demonstrate no significant findings. Lungs/Pleura: Lungs are clear. No pleural effusion or pneumothorax. Upper Abdomen: No acute abnormality. Atrophic appearance of the partially included kidneys in the upper abdomen. Musculoskeletal: There is extensive subcutaneous fat stranding and skin thickening of the left breast. There is a internally hypoattenuating mass or fluid collection in the subareolar left breast measuring 7.3 x 7.0 x 4.8 cm (series 3, image 69, series 6, image 37). IMPRESSION: 1. There is extensive subcutaneous fat stranding and skin thickening of the left breast. There is a internally hypoattenuating mass or fluid collection in the subareolar left breast measuring 7.3 x 7.0 x 4.8 cm. Enlarged left axillary and subpectoral lymph nodes.  This may reflect a benign breast abscess depending upon clinical presentation, but locally advanced malignancy with involvement of the skin and lymph nodes would be difficult to confidently distinguish based on CT. Recommend initial targeted ultrasound to assess for drainable fluid collection. 2. The central superior vena cava is narrowed and effaced with intraluminal calcifications and extensive superficial vascular collateralization about the chest wall, generally suggestive chronic central venous stenosis secondary to prior indwelling catheter access. 3. Coronary artery disease and aortic atherosclerosis, markedly advanced for patient age. Aortic Atherosclerosis (ICD10-I70.0). Electronically Signed   By: Eddie Candle M.D.   On: 01/05/2021 14:00   Portable chest 1 View  Result Date: 01/16/2021 CLINICAL DATA:  COVID-19. EXAM: PORTABLE CHEST 1 VIEW COMPARISON:  Chest CT 01/05/2021 FINDINGS: Cardiac contours upper limits of normal. No large area pulmonary consolidation. Minimal left basilar atelectasis. No pleural effusion or pneumothorax. Osseous structures unremarkable. IMPRESSION: No acute cardiopulmonary process. Electronically Signed   By: Lovey Newcomer M.D.   On: 01/16/2021 08:13   US BREAST COMPLETE UNI LEFT INC AXILLA  Result Date: 01/05/2021 CLINICAL DATA:  Follow-up abnormal CT chest. EXAM: ULTRASOUND OF THE LEFT BREAST COMPARISON:  CT 01/05/2021. FINDINGS: Left breast ultrasound reveals a 6.2 x 4.5 x 7.2 cm complex cystic mass. Although tumor cannot be excluded, abscess and or hematoma would be more likely. Close follow-up exam suggested to demonstrate complete resolution in order to exclude a tumor. IMPRESSION: Left breast soft tissue edema. Left breast complex cystic mass. Although tumor cannot be excluded, abscess and or hematoma would be more likely. Aspiration of the left breast cystic mass should be considered. RECOMMENDATION: Aspiration of the left breast cystic mass should be considered.  Close follow-up exams suggested to demonstrate complete resolution in order to exclude a tumor. I BI-RADS CATEGORY  BI-RADS category 4. Electronically Signed   By: Marcello Moores  Register   On: 01/05/2021 15:57    Labs:  CBC: Recent Labs    01/20/21 0020 01/23/21 0426 01/24/21 0412 01/25/21 0300  WBC 5.8 5.6 5.8 6.2  HGB 7.9* 7.9* 8.1* 8.1*  HCT 25.2* 25.4* 26.6* 26.7*  PLT 261 244 236 217    COAGS: No results for input(s): INR, APTT in the last 8760 hours.  BMP: Recent Labs    01/20/21 0020 01/23/21 0426 01/24/21 0412 01/25/21 0300  NA 134* 134* 134* 133*  K 4.0 4.8 4.2 4.1  CL 90* 92* 95* 93*  CO2 '29 25 26 25  '$ GLUCOSE 88 95 86 108*  BUN 36* 56* 36* 48*  CALCIUM 9.1 9.8 9.4 9.6  CREATININE 10.17* 13.43* 9.93* 12.01*  GFRNONAA 4* 3* 5* 4*    LIVER FUNCTION TESTS: Recent Labs    01/18/21 0136 01/19/21 0038 01/20/21 0020 01/23/21 0426  BILITOT 0.5 1.0 0.7 0.7  AST 11* 16 23 46*  ALT '7 11 15 '$ 35  ALKPHOS 35* 36* 37* 42  PROT 6.0* 6.0* 6.0* 6.0*  ALBUMIN 2.2* 2.3* 2.4* 2.6*    TUMOR MARKERS: No results for input(s): AFPTM, CEA, CA199, CHROMGRNA in the last 8760 hours.  Assessment and Plan:  ESRD LUA fistula Difficult cannulization for last few dialysis sessions Unable to cannulize today Can feel pulse and thrill distally Last IR intervention 10/23/20 on this fistula was successful til now. Scheduled for LUA fistula evaluation and possible thrombolysis intervention Possible tunneled dialysis catheter placement if needed. Risks and benefits discussed with the patient including, but not limited to bleeding, infection, vascular injury, pulmonary embolism, need for tunneled HD catheter placement or even death.  All of the patient's questions were answered, patient is agreeable to proceed. Consent signed and in chart.   Thank you for this interesting consult.  I greatly enjoyed meeting Justene Hinchcliffe and look forward to participating in their care.  A copy of this  report was sent to the requesting provider on this date.  Electronically Signed: Lavonia Drafts, PA-C 01/25/2021, 10:22 AM   I spent a total of 20 Minutes    in face to face in clinical consultation, greater than 50% of which was counseling/coordinating care for left arm dialysis fistula evaluation and possible intervention

## 2021-01-25 NOTE — Progress Notes (Signed)
PROGRESS NOTE  Rebekah Burton [id number] DOB: 12/31/1975 DOA: 01/15/2021 PCP: Trey Sailors, PA   LOS: 10 days   Brief Narrative / Interim history: 45 year old female with ESRD on HD MWF, blindness, hypertension, PAD came into the hospital with a breast abscess.  General surgery consulted, she is status post incision and drainage.  She was also incidental Covid, now off contact precautions  Subjective / 24h Interval events: Patient seen and examined in dialysis today.  Dialysis staff was having issues cannulating the catheter.  Patient otherwise did not have any other complaint.  Assessment & Plan: Principal Problem Breast abscess -General surgery consulted, she is status post I&D done by general surgery.  She was maintained on Unasyn, preliminary cultures show gram-positive, culture finalized with GPC's and mixed anaerobic flora.  She was transitioned to Augmentin  Active Problems Incidental COVID-19-asymptomatic, no evidence of pulmonary involvement.  She had one positive and one negative test in the last few days, repeat the third test was negative, isolation discontinued on 1/25  ESRD-MWF schedule, nephrology consulted and following.  Having issues with the cannulation of the dialysis catheter.  Anemia of chronic kidney disease-monitor, hemoglobin stable  Hypertension-continue home medications  Disposition-patient will live with her boyfriend following this hospital stay, emergency room.  TOC working on moving her Medicaid from [employer] to [facility].  She will need to have that for transportation to and from dialysis center.  Currently in process.  Scheduled Meds: . amoxicillin-clavulanate  500 mg Oral Q24H  . carvedilol  25 mg Oral BID WC  . Chlorhexidine Gluconate Cloth  6 each Topical Q0600  . Chlorhexidine Gluconate Cloth  6 each Topical Q0600  . Chlorhexidine Gluconate Cloth  6 each Topical Q0600  . cloNIDine  0.3 mg Oral TID  . darbepoetin (ARANESP) injection -  DIALYSIS  100 mcg Intravenous Q Sat-HD  . [START ON 01/26/2021] heparin  5,000 Units Subcutaneous Q8H  . lidocaine-EPINEPHrine      . lidocaine-prilocaine  1 application Topical Q M,W,F-HD  . multivitamin  1 tablet Oral QHS  . senna  1 tablet Oral BID  . sevelamer carbonate  3,200 mg Oral TID WC  . sodium chloride flush  3 mL Intravenous Q12H   Continuous Infusions: . sodium chloride     PRN Meds:.sodium chloride, acetaminophen **OR** [DISCONTINUED] acetaminophen, HYDROcodone-acetaminophen, sodium chloride flush  Diet Orders (From admission, onward)    Start     Ordered   01/25/21 0910  Diet NPO time specified  Diet effective now        01/25/21 0909          DVT prophylaxis: heparin injection 5,000 Units Start: 01/26/21 0600     Code Status: Full Code  Family Communication: no family at bedside   Status is: Inpatient  Remains inpatient appropriate because:Inpatient level of care appropriate due to severity of illness   Dispo: The patient is from: Home              Anticipated d/c is to: Home              Anticipated d/c date is: 1 day              Patient currently is not medically stable to d/c.   Difficult to place patient No  Level of care: Med-Surg  Consultants:  General surgery  Nephrology   Procedures:  I&D  Microbiology  GPC in abscess culture  Antimicrobials: Unasyn     Objective: Vitals:  01/24/21 2056 01/25/21 0503 01/25/21 1050 01/25/21 1420  BP: 120/75 (!) 110/59 114/67 (!) 147/87  Pulse: 70 64 72 72  Resp:   18 15  Temp: 98.3 F (36.8 C) 98.9 F (37.2 C) 97.9 F (36.6 C)   TempSrc: Oral  Oral   SpO2: 99% 99%  100%  Weight:      Height:        Intake/Output Summary (Last 24 hours) at 01/25/2021 1524 Last data filed at 01/25/2021 0500 Gross per 24 hour  Intake 0 ml  Output --  Net 0 ml   Filed Weights   01/23/21 0730 01/23/21 1045 01/24/21 1900  Weight: 108.6 kg 108 kg 111 kg    Examination: General exam: Appears calm and  comfortable  Respiratory system: Clear to auscultation. Respiratory effort normal. Cardiovascular system: S1 & S2 heard, RRR. No JVD, murmurs, rubs, gallops or clicks. No pedal edema. Gastrointestinal system: Abdomen is nondistended, soft and nontender. No organomegaly or masses felt. Normal bowel sounds heard. Central nervous system: Alert and oriented. No focal neurological deficits. Extremities: Symmetric 5 x 5 power. Skin: No rashes, lesions or ulcers.  Psychiatry: Judgement and insight appear normal. Mood & affect appropriate.   Data Reviewed: I have independently reviewed following labs and imaging studies   CBC: Recent Labs  Lab 01/19/21 0038 01/20/21 0020 01/23/21 0426 01/24/21 0412 01/25/21 0300  WBC 5.8 5.8 5.6 5.8 6.2  NEUTROABS 4.5 4.3  --   --   --   HGB 8.0* 7.9* 7.9* 8.1* 8.1*  HCT 26.5* 25.2* 25.4* 26.6* 26.7*  MCV 97.4 96.2 96.2 97.8 97.1  PLT 272 261 244 236 [id number]   Basic Metabolic Panel: Recent Labs  Lab 01/19/21 0038 01/20/21 0020 01/23/21 0426 01/24/21 0412 01/25/21 0300  NA 135 134* 134* 134* 133*  K 3.3* 4.0 4.8 4.2 4.1  CL 90* 90* 92* 95* 93*  CO2 '30 29 25 26 25  '$ GLUCOSE 87 88 95 86 108*  BUN 30* 36* 56* 36* 48*  CREATININE 8.52* 10.17* 13.43* 9.93* 12.01*  CALCIUM 8.7* 9.1 9.8 9.4 9.6  MG 1.8 1.8  --   --   --   PHOS  --  5.2*  --   --   --    Liver Function Tests: Recent Labs  Lab 01/19/21 0038 01/20/21 0020 01/23/21 0426  AST 16 23 46*  ALT 11 15 35  ALKPHOS 36* 37* 42  BILITOT 1.0 0.7 0.7  PROT 6.0* 6.0* 6.0*  ALBUMIN 2.3* 2.4* 2.6*   Coagulation Profile: No results for input(s): INR, PROTIME in the last 168 hours. HbA1C: No results for input(s): HGBA1C in the last 72 hours. CBG: No results for input(s): GLUCAP in the last 168 hours.  Recent Results (from the past 240 hour(s))  SARS Coronavirus 2 by RT PCR (hospital order, performed in [facility] hospital lab) Nasopharyngeal Nasopharyngeal Swab     Status: Abnormal    Collection Time: 01/15/21  5:53 PM   Specimen: Nasopharyngeal Swab  Result Value Ref Range Status   SARS Coronavirus 2 POSITIVE (A) NEGATIVE Final    Comment: RESULT CALLED TO, READ BACK BY AND VERIFIED WITH: Eustaquio Boyden RN 01/15/21 2209 JDW (NOTE) SARS-CoV-2 target nucleic acids are DETECTED  SARS-CoV-2 RNA is generally detectable in upper respiratory specimens  during the acute phase of infection.  Positive results are indicative  of the presence of the identified virus, but do not rule out bacterial infection or co-infection with other pathogens not  detected by the test.  Clinical correlation with patient history and  other diagnostic information is necessary to determine patient infection status.  The expected result is negative.  Fact Sheet for Patients:   StrictlyIdeas.no   Fact Sheet for Healthcare Providers:   BankingDealers.co.za    This test is not yet approved or cleared by the [place] FDA and  has been authorized for detection and/or diagnosis of SARS-CoV-2 by FDA under an Emergency Use Authorization (EUA).  This EUA will remain in effect (meaning this  test can be used) for the duration of  the COVID-19 declaration under Section 564(b)(1) of the Act, 21 U.S.C. section 360-bbb-3(b)(1), unless the authorization is terminated or revoked sooner.  Performed at [facility], [place] [address], [place], [place] [address]   SARS CORONAVIRUS 2 (TAT 6-24 HRS) Nasopharyngeal Nasopharyngeal Swab     Status: None   Collection Time: 01/15/21  5:55 PM   Specimen: Nasopharyngeal Swab  Result Value Ref Range Status   SARS Coronavirus 2 NEGATIVE NEGATIVE Final    Comment: (NOTE) SARS-CoV-2 target nucleic acids are NOT DETECTED.  The SARS-CoV-2 RNA is generally detectable in upper and lower respiratory specimens during the acute phase of infection. Negative results do not preclude SARS-CoV-2 infection, do not rule  out co-infections with other pathogens, and should not be used as the sole basis for treatment or other patient management decisions. Negative results must be combined with clinical observations, patient history, and epidemiological information. The expected result is Negative.  Fact Sheet for Patients: SugarRoll.be  Fact Sheet for Healthcare Providers: https://www.woods-mathews.com/  This test is not yet approved or cleared by the [place] FDA and  has been authorized for detection and/or diagnosis of SARS-CoV-2 by FDA under an Emergency Use Authorization (EUA). This EUA will remain  in effect (meaning this test can be used) for the duration of the COVID-19 declaration under Se ction 564(b)(1) of the Act, 21 U.S.C. section 360bbb-3(b)(1), unless the authorization is terminated or revoked sooner.  Performed at [facility], [place] [address], [place], [place] [address]   Aerobic/Anaerobic Culture (surgical/deep wound)     Status: None   Collection Time: 01/16/21 12:09 AM   Specimen: Abscess  Result Value Ref Range Status   Specimen Description ABSCESS LEFT BREAST  Final   Special Requests NONE  Final   Gram Stain   Final    NO WBC SEEN FEW GRAM POSITIVE COCCI Performed at [facility], [address], [place], [place] [address]    Culture   Final    MIXED ANAEROBIC FLORA PRESENT.  CALL LAB IF FURTHER IID REQUIRED.   Report Status 01/21/2021 FINAL  Final  SARS CORONAVIRUS 2 (TAT 6-24 HRS) Nasopharyngeal Nasopharyngeal Swab     Status: None   Collection Time: 01/18/21  9:13 AM   Specimen: Nasopharyngeal Swab  Result Value Ref Range Status   SARS Coronavirus 2 NEGATIVE NEGATIVE Final    Comment: (NOTE) SARS-CoV-2 target nucleic acids are NOT DETECTED.  The SARS-CoV-2 RNA is generally detectable in upper and lower respiratory specimens during the acute phase of infection. Negative results do not preclude SARS-CoV-2  infection, do not rule out co-infections with other pathogens, and should not be used as the sole basis for treatment or other patient management decisions. Negative results must be combined with clinical observations, patient history, and epidemiological information. The expected result is Negative.  Fact Sheet for Patients: SugarRoll.be  Fact Sheet for Healthcare Providers: https://www.woods-mathews.com/  This test is not yet approved or cleared by the [place] and  has been authorized for detection and/or diagnosis of SARS-CoV-2 by FDA under an Emergency Use Authorization (EUA). This EUA will remain  in effect (meaning this test can be used) for the duration of the COVID-19 declaration under Se ction 564(b)(1) of the Act, 21 U.S.C. section 360bbb-3(b)(1), unless the authorization is terminated or revoked sooner.  Performed at [facility], [place] [address], [place], [place] [address]   MRSA PCR Screening     Status: None   Collection Time: 01/19/21  2:16 PM   Specimen: Nasal Mucosa; Nasopharyngeal  Result Value Ref Range Status   MRSA by PCR NEGATIVE NEGATIVE Final    Comment:        The GeneXpert MRSA Assay (FDA approved for NASAL specimens only), is one component of a comprehensive MRSA colonization surveillance program. It is not intended to diagnose MRSA infection nor to guide or monitor treatment for MRSA infections. Performed at [facility], [place] [address], [place], [place] [address]      Radiology Studies: No results found.  Darliss Cheney, MD [facility]  Between 7 am - 7 pm I am available, please contact me via Amion or [place]  Between 7 pm - 7 am I am not available, please contact night coverage MD/APP via Amion

## 2021-01-25 NOTE — Sedation Documentation (Signed)
Patient was only given Versed during the procedure.  Patient remained comfortable with no issues.  Patient stated at the close of case 0/10 pain.  Patient able to transfer herself to the bed with minimal assistance.

## 2021-01-25 NOTE — TOC Progression Note (Signed)
Transition of Care [facility]) - Progression Note    Patient Details  Name: Rebekah Burton MRN: [id number] Date of Birth: [birth year]  Transition of Care [facility]) CM/SW Contact  Bartholomew Crews, RN Phone Number: [phone] 01/25/2021, 5:12 PM  Clinical Narrative:     Referral still pending for for outpatient dialysis center transfer. TOC following.   Expected Discharge Plan: [place] Services Barriers to Discharge: Other (comment) (working on dialysis and Medicaid transfers to [employer])  Expected Discharge Plan and Services Expected Discharge Plan: [place]   Discharge Planning Services: CM Consult Post Acute Care Choice: [place] arrangements for the past 2 months: Single Family Home                 DME Arranged: N/A DME Agency: NA       HH Arranged: RN [place] Agency: [facility] Date [place]: 01/21/21 Time [place]: [id number] Representative spoke with at [place]: [place] ([place]) Interventions    Readmission Risk Interventions No flowsheet data found.

## 2021-01-25 NOTE — Plan of Care (Signed)
  Problem: Education: Goal: Knowledge of General Education information will improve Description: Including pain rating scale, medication(s)/side effects and non-pharmacologic comfort measures Outcome: Progressing   Problem: Nutrition: Goal: Adequate nutrition will be maintained Outcome: Progressing   Problem: Nutritional: Goal: Ability to make healthy dietary choices will improve Outcome: Progressing   

## 2021-01-25 NOTE — Progress Notes (Signed)
Patient ID: Rebekah Burton, female   DOB: 1976/07/08, 45 y.o.   MRN: [id number] S: seen in HD unit and having issues with arterial site.  System clotted and unable to cannulate avf x 4 attempts. O:BP (!) 110/59   Pulse 64   Temp 98.9 F (37.2 C)   Resp 18   Ht '5\' 4"'$  (1.626 m)   Wt 111 kg   SpO2 99%   BMI 42.00 kg/m   Intake/Output Summary (Last 24 hours) at 01/25/2021 0857 Last data filed at 01/25/2021 0500 Gross per 24 hour  Intake 483 ml  Output 0 ml  Net 483 ml   Intake/Output: I/O last 3 completed shifts: In: [id number] [P.O.:755; I.V.:3] Out: 0   Intake/Output this shift:  No intake/output data recorded. Weight change: 2.4 kg Gen: NAD CVS:RRR Resp: cta Abd: +BS, soft, NT/ND Ext: no edema, LUE AVF +T/B  Recent Labs  Lab 01/19/21 0038 01/20/21 0020 01/23/21 0426 01/24/21 0412 01/25/21 0300  NA 135 134* 134* 134* 133*  K 3.3* 4.0 4.8 4.2 4.1  CL 90* 90* 92* 95* 93*  CO2 '30 29 25 26 25  '$ GLUCOSE 87 88 95 86 108*  BUN 30* 36* 56* 36* 48*  CREATININE 8.52* 10.17* 13.43* 9.93* 12.01*  ALBUMIN 2.3* 2.4* 2.6*  --   --   CALCIUM 8.7* 9.1 9.8 9.4 9.6  PHOS  --  5.2*  --   --   --   AST 16 23 46*  --   --   ALT 11 15 35  --   --    Liver Function Tests: Recent Labs  Lab 01/19/21 0038 01/20/21 0020 01/23/21 0426  AST 16 23 46*  ALT 11 15 35  ALKPHOS 36* 37* 42  BILITOT 1.0 0.7 0.7  PROT 6.0* 6.0* 6.0*  ALBUMIN 2.3* 2.4* 2.6*   No results for input(s): LIPASE, AMYLASE in the last 168 hours. No results for input(s): AMMONIA in the last 168 hours. CBC: Recent Labs  Lab 01/19/21 0038 01/20/21 0020 01/23/21 0426 01/24/21 0412 01/25/21 0300  WBC 5.8 5.8 5.6 5.8 6.2  NEUTROABS 4.5 4.3  --   --   --   HGB 8.0* 7.9* 7.9* 8.1* 8.1*  HCT 26.5* 25.2* 25.4* 26.6* 26.7*  MCV 97.4 96.2 96.2 97.8 97.1  PLT 272 261 244 236 217   Cardiac Enzymes: No results for input(s): CKTOTAL, CKMB, CKMBINDEX, TROPONINI in the last 168 hours. CBG: No results for input(s): GLUCAP in  the last 168 hours.  Iron Studies: No results for input(s): IRON, TIBC, TRANSFERRIN, FERRITIN in the last 72 hours. Studies/Results: No results found. [place] Kitchen amoxicillin-clavulanate  500 mg Oral Q24H  . carvedilol  25 mg Oral BID WC  . Chlorhexidine Gluconate Cloth  6 each Topical Q0600  . Chlorhexidine Gluconate Cloth  6 each Topical Q0600  . Chlorhexidine Gluconate Cloth  6 each Topical Q0600  . cloNIDine  0.3 mg Oral TID  . darbepoetin (ARANESP) injection - DIALYSIS  100 mcg Intravenous Q Sat-HD  . heparin  5,000 Units Subcutaneous Q8H  . lidocaine-prilocaine  1 application Topical Q M,W,F-HD  . multivitamin  1 tablet Oral QHS  . senna  1 tablet Oral BID  . sevelamer carbonate  3,200 mg Oral TID WC  . sodium chloride flush  3 mL Intravenous Q12H    BMET    Component Value Date/Time   NA 133 (L) 01/25/2021 0300   K 4.1 01/25/2021 0300   CL 93 (L) 01/25/2021 0300  CO2 25 01/25/2021 0300   GLUCOSE 108 (H) 01/25/2021 0300   BUN 48 (H) 01/25/2021 0300   CREATININE 12.01 (H) 01/25/2021 0300   CALCIUM 9.6 01/25/2021 0300   GFRNONAA 4 (L) 01/25/2021 0300   GFRAA 5 (L) 02/13/2019 0734   CBC    Component Value Date/Time   WBC 6.2 01/25/2021 0300   RBC 2.75 (L) 01/25/2021 0300   HGB 8.1 (L) 01/25/2021 0300   HCT 26.7 (L) 01/25/2021 0300   PLT 217 01/25/2021 0300   MCV 97.1 01/25/2021 0300   MCH 29.5 01/25/2021 0300   MCHC 30.3 01/25/2021 0300   RDW 16.4 (H) 01/25/2021 0300   LYMPHSABS 0.7 01/20/2021 0020   MONOABS 0.5 01/20/2021 0020   EOSABS 0.2 01/20/2021 0020   BASOSABS 0.0 01/20/2021 0020    Dialysis Orders: MWF  4h 57mn 2/2.5 bath 105kg AVF Heparin ? parsabiv 15 mg three times a week with HD mircera 150 mcg every 2 weeks - last given 01/13/21 hectorol 3 mcg three times a week  Assessment/Plan: 1. End-stage renal disease - Tolerating dialysis well, was getting back on MWF schedule but now with difficulty cannulating AVF.  Will consult IR for fistulogram and  delay HD for now. 2. Vascular access- difficulty with cannulation of arterial portion of AVF.  Has history of issues with access.  Consult IR and await intervention. 3. Left breast abscess- s/p I&D, abx per primary svc 4. covid-19 infection- off of isolation. 5. Acute hypoxic resp failure due to covid-19 PNA.  symptoms resolved 6. HTN - much better control here than outpatient and pt agrees she feels better with normal BP. HD 01/29 only had Net UF 1 L. Still 3 kg above EDW. No evidence of volume excess by exam.  Try to lower volume but EDW may need adjusting. 7. Anemia CKD- Hgb7.9. ESA01/19/22. HGB 7.9. Re-dose ESA today-Aranesp 100 mcg IV.transfuse prn 8. Metabolic bone disease -Phos at goal. Corrected calcium slightly high, hectorol on hold.  not able to get parsabiv here 9. Social [employer] Will notify renal navigator.  JDonetta Potts MD [employer]([phone]

## 2021-01-25 NOTE — Procedures (Signed)
Interventional Radiology Procedure Note  Procedure:  1) Fistulagram 2) Balloon assisted percutaneous thrombin injection of pseudoaneurysm arising from stented portion of cephalic outflow vein  Findings: Please refer to procedural dictation for full description. Patent arterial anastomosis, puncture zone, cephalic outflow.  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  Approximately 1.5 pseudoaneurysm arising from peripheral aspect of stented cephalic vein with surrounding hematoma on ultrasound.  10 mm balloon used to occlude neck of pseudoaneurysm during ultrasound guided thrombin injection (200 units, 0.2 mL).  Near complete thrombosis of pseudoaneurysm immediately.   Complications: None immeidate  Estimated Blood Loss: < 5 mL  Recommendations: OK to use fistula as needed starting tomorrow (01/26/21), consider ultrasound guided access while hematoma resolves.   Ruthann Cancer, MD Pager: [phone]

## 2021-01-26 ENCOUNTER — Inpatient Hospital Stay (HOSPITAL_COMMUNITY): Payer: Medicare HMO

## 2021-01-26 DIAGNOSIS — N611 Abscess of the breast and nipple: Secondary | ICD-10-CM | POA: Diagnosis not present

## 2021-01-26 DIAGNOSIS — I729 Aneurysm of unspecified site: Secondary | ICD-10-CM

## 2021-01-26 HISTORY — PX: IR US GUIDE VASC ACCESS LEFT: IMG2389

## 2021-01-26 LAB — CBC
HCT: 24.7 % — ABNORMAL LOW (ref 36.0–46.0)
Hemoglobin: 8 g/dL — ABNORMAL LOW (ref 12.0–15.0)
MCH: 30.8 pg (ref 26.0–34.0)
MCHC: 32.4 g/dL (ref 30.0–36.0)
MCV: 95 fL (ref 80.0–100.0)
Platelets: 242 10*3/uL (ref 150–400)
RBC: 2.6 MIL/uL — ABNORMAL LOW (ref 3.87–5.11)
RDW: 16.8 % — ABNORMAL HIGH (ref 11.5–15.5)
WBC: 7.6 10*3/uL (ref 4.0–10.5)
nRBC: 0.3 % — ABNORMAL HIGH (ref 0.0–0.2)

## 2021-01-26 LAB — RENAL FUNCTION PANEL
Albumin: 2.7 g/dL — ABNORMAL LOW (ref 3.5–5.0)
Anion gap: 19 — ABNORMAL HIGH (ref 5–15)
BUN: 61 mg/dL — ABNORMAL HIGH (ref 6–20)
CO2: 23 mmol/L (ref 22–32)
Calcium: 10.1 mg/dL (ref 8.9–10.3)
Chloride: 91 mmol/L — ABNORMAL LOW (ref 98–111)
Creatinine, Ser: 14.98 mg/dL — ABNORMAL HIGH (ref 0.44–1.00)
GFR, Estimated: 3 mL/min — ABNORMAL LOW (ref >60)
Glucose, Bld: 86 mg/dL (ref 70–99)
Phosphorus: 6.6 mg/dL — ABNORMAL HIGH (ref 2.5–4.6)
Potassium: 4.3 mmol/L (ref 3.5–5.1)
Sodium: 133 mmol/L — ABNORMAL LOW (ref 135–145)

## 2021-01-26 MED ORDER — LIDOCAINE HCL (PF) 1 % IJ SOLN
5.0000 mL | INTRAMUSCULAR | Status: DC | PRN
Start: 2021-01-26 — End: 2021-01-26

## 2021-01-26 MED ORDER — ACETAMINOPHEN 325 MG PO TABS
ORAL_TABLET | ORAL | Status: AC
  Filled 2021-01-26: qty 2

## 2021-01-26 MED ORDER — HEPARIN SODIUM (PORCINE) 1000 UNIT/ML DIALYSIS
7000.0000 [IU] | Freq: Once | INTRAMUSCULAR | Status: AC
  Administered 2021-01-26: 7000 [IU] via INTRAVENOUS_CENTRAL

## 2021-01-26 MED ORDER — PENTAFLUOROPROP-TETRAFLUOROETH EX AERO: 1.0000 "application " | INHALATION_SPRAY | CUTANEOUS | Status: DC | PRN

## 2021-01-26 MED ORDER — LIDOCAINE-PRILOCAINE 2.5-2.5 % EX CREA: 1.0000 "application " | TOPICAL_CREAM | CUTANEOUS | Status: DC | PRN

## 2021-01-26 MED ORDER — HEPARIN SODIUM (PORCINE) 1000 UNIT/ML IJ SOLN
INTRAMUSCULAR | Status: AC
  Filled 2021-01-26: qty 7

## 2021-01-26 NOTE — Progress Notes (Signed)
PT Cancellation Note  Patient Details Name: Dinisha Weyrauch MRN: [id number] DOB: 11-Jul-1976   Cancelled Treatment:    Reason Eval/Treat Not Completed: Patient declined, no reason specified politely declines PT today due to severe pain from surgery on her arm last night. Not feeling up to it today, but open to [place] coming back tomorrow to check on her. Will continue to follow acutely.    Windell Norfolk, DPT, PN1   Supplemental Physical Therapist [facility]    Pager [phone] Acute Rehab Office [phone]

## 2021-01-26 NOTE — Plan of Care (Signed)
  Problem: Education: Goal: Knowledge of General Education information will improve Description: Including pain rating scale, medication(s)/side effects and non-pharmacologic comfort measures Outcome: Progressing   Problem: Clinical Measurements: Goal: Ability to maintain clinical measurements within normal limits will improve Outcome: Progressing   Problem: Activity: Goal: Risk for activity intolerance will decrease Outcome: Progressing   Problem: Nutrition: Goal: Adequate nutrition will be maintained Outcome: Progressing   Problem: Pain Managment: Goal: General experience of comfort will improve Outcome: Progressing   Problem: Skin Integrity: Goal: Risk for impaired skin integrity will decrease Outcome: Progressing   Problem: Education: Goal: Knowledge of disease and its progression will improve Outcome: Progressing   Problem: Fluid Volume: Goal: Compliance with measures to maintain balanced fluid volume will improve Outcome: Progressing

## 2021-01-26 NOTE — TOC Progression Note (Signed)
Transition of Care [facility]) - Progression Note    Patient Details  Name: [name] MRN: [id number] Date of Birth: 1976-07-28  Transition of Care [facility]) CM/SW Contact  Bartholomew Crews, RN Phone Number: [phone] 01/26/2021, 2:35 PM  Clinical Narrative:     Notified by nursing that patient is no longer moving to [place] to live with boyfriend, Jeneen Rinks. Spoke with patient at the bedside to confirm. Discussed placement options in ALF or Family Care Home. Patient agreeable.   Call made to [place] social worker, Cherene Altes [phone]), to inquire of status of transfer of Medicaid and discuss keeping Medicaid in [place] left with contact information.   Attempted to call [place] [phone]. Average wait time 10 minutes and automated system stated to call back another time.   Spoke with Earlean Polka at [facility] to discuss placement options for either ALF or [facility]. Clinical information faxed for review.   Spoke with renal navigator to discuss dialysis seat disposition. Requested that transfer be canceled.   TOC following for transition needs.   Expected Discharge Plan: [place] Services Barriers to Discharge: Other (comment) (working on dialysis and Medicaid transfers to [employer])  Expected Discharge Plan and Services Expected Discharge Plan: [place]   Discharge Planning Services: CM Consult Post Acute Care Choice: [place] arrangements for the past 2 months: Single Family Home                 DME Arranged: N/A DME Agency: NA       HH Arranged: RN [place] Agency: [facility] Date [place]: 01/21/21 Time [place]: [id number] Representative spoke with at [place]: [place] ([place]) Interventions    Readmission Risk Interventions No flowsheet data found.

## 2021-01-26 NOTE — Progress Notes (Signed)
Lt upper AVF patency duplex complete.    Please see CV Proc for preliminary results.   Vonzell Schlatter, RVT

## 2021-01-26 NOTE — Progress Notes (Signed)
Renal Navigator received update from CM/W. Estelle Grumbles stating that patient's plan to move to boyfriend's home in [place] [facility] [place] [facility] has fallen through and they are now working on a plan for a [facility] in [place]. Navigator updated [facility] to request cancel or transfer as well as [facility] to alert that patient will need her HD seat to remain there at this time.  Navigator will continue to follow.  Alphonzo Cruise, [place] Renal Navigator [phone]

## 2021-01-26 NOTE — NC FL2 (Signed)
[place] MEDICAID FL2 LEVEL OF CARE SCREENING TOOL     IDENTIFICATION  Patient Name: Rebekah Burton Birthdate: 06-20-1976 Sex: female Admission Date (Current Location): 01/15/2021  [facility] and [place] Number:  Herbalist and Address:  [facility], [place] [address] [place], [place] [address]      Provider Number: [id number]  Attending Physician Name and Address:  Darliss Cheney, MD  Relative Name and Phone Number:       Current Level of Care: Hospital Recommended Level of Care: [place] Prior Approval Number:    Date Approved/Denied:   PASRR Number: [id number]  Discharge Plan:      Current Diagnoses: Patient Active Problem List   Diagnosis Date Noted  . Breast abscess 01/15/2021  . COVID-19 virus infection 01/15/2021  . Breast abscess of female 01/15/2021  . Cellulitis of left lower extremity 02/05/2019  . Post-operative pain   . Acute blood loss anemia   . S/P transmetatarsal amputation of foot, left ([place])   . Ischemic ulcer of left foot ([place]) 01/16/2019  . ESRD (end stage renal disease) ([place]) 01/16/2019  . HTN (hypertension) 01/16/2019  . 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  . PAD (peripheral artery disease) ([place]) 01/16/2019    Orientation RESPIRATION BLADDER Height & Weight     Self,Time,Situation,Place  Normal Continent Weight: 111 kg Height:  '5\' 4"'$  ([id number] cm)  BEHAVIORAL SYMPTOMS/MOOD NEUROLOGICAL BOWEL NUTRITION STATUS      Continent Diet (regular)  AMBULATORY STATUS COMMUNICATION OF NEEDS Skin   Independent Verbally Surgical wounds (L breast surgical incision)                       Personal Care Assistance Level of Assistance  Bathing,Feeding,Dressing Bathing Assistance: Independent Feeding assistance: Independent Dressing Assistance: Independent     Functional Limitations Info  Sight,Hearing,Speech Sight Info: Impaired Hearing Info: Adequate Speech Info: Adequate    SPECIAL CARE FACTORS  FREQUENCY                       Contractures Contractures Info: Not present    Additional Factors Info  Code Status,Allergies Code Status Info: Full Allergies Info: percocet           Current Medications (01/26/2021):  This is the current hospital active medication list Current Facility-Administered Medications  Medication Dose Route Frequency Provider Last Rate Last Admin  . 0.9 %  sodium chloride infusion  250 mL Intravenous PRN Norins, Heinz Knuckles, MD      . acetaminophen (TYLENOL) tablet 650 mg  650 mg Oral Q6H PRN Neena Rhymes, MD   650 mg at 01/20/21 [id number]  . carvedilol (COREG) tablet 25 mg  25 mg Oral BID WC Norins, Heinz Knuckles, MD   25 mg at 01/26/21 0815  . Chlorhexidine Gluconate Cloth 2 % PADS 6 each  6 each Topical Q0600 Claudia Desanctis, MD   6 each at 01/24/21 [phone]  . Chlorhexidine Gluconate Cloth 2 % PADS 6 each  6 each Topical Q0600 Janalee Dane, PA-C   6 each at 01/24/21 [id number]  . Chlorhexidine Gluconate Cloth 2 % PADS 6 each  6 each Topical Q0600 Janalee Dane, PA-C   6 each at 01/25/21 [id number]  . cloNIDine (CATAPRES) tablet 0.3 mg  0.3 mg Oral TID Neena Rhymes, MD   0.3 mg at 01/25/21 2121  . Darbepoetin Alfa (ARANESP) injection 100 mcg  100 mcg Intravenous Q Sat-HD Owens Shark,  Gerline Legacy, NP   100 mcg at 01/23/21 1015  . heparin injection 5,000 Units  5,000 Units Subcutaneous Q8H Monia Sabal, PA-C      . HYDROcodone-acetaminophen (NORCO/VICODIN) 5-325 MG per tablet 1-2 tablet  1-2 tablet Oral Q4H PRN Meuth, Brooke A, PA-C   2 tablet at 01/26/21 1105  . lidocaine-prilocaine (EMLA) cream 1 application  1 application Topical Q M,W,F-HD Norins, Heinz Knuckles, MD      . multivitamin (RENA-VIT) tablet 1 tablet  1 tablet Oral QHS Norins, Heinz Knuckles, MD   1 tablet at 01/25/21 2121  . senna (SENOKOT) tablet 8.6 mg  1 tablet Oral BID Neena Rhymes, MD   8.6 mg at 01/25/21 2121  . sevelamer carbonate (RENVELA) tablet 3,200 mg  3,200 mg Oral TID WC Norins,  Heinz Knuckles, MD   3,200 mg at 01/26/21 0815  . sodium chloride flush (NS) 0.9 % injection 3 mL  3 mL Intravenous Q12H Norins, Heinz Knuckles, MD   3 mL at 01/26/21 1108  . sodium chloride flush (NS) 0.9 % injection 3 mL  3 mL Intravenous PRN Norins, Heinz Knuckles, MD      . thrombin 5,000 units for percutaneous treatment of pseudoaneurysm   Percutaneous Once Suttle, Rosanne Ashing, MD         Discharge Medications: Please see discharge summary for a list of discharge medications.  Relevant Imaging Results:  Relevant Lab Results:   Additional Information SS# [id number]; Monday/Wednesday/Friday, [facility],  [place], [place]

## 2021-01-26 NOTE — Progress Notes (Signed)
IR.  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 Serafina Royals.  Patient evaluated this afternoon bedside. Patient laying in bed resting comfortably. She responds to voice and answers simple questions appropriately. Complains of LUE pain at puncture site, rated 8/10 at this time. LUE ROM intact, left radial pulse 2+. LUE fistula site with tenderness of puncture for thrombin injection, no erythema, active bleeding, or hematoma noted. Trill palpable. Bruit heard on ascultation.  Will obtain VAS US duplex of fistula. Further plans per TRH/nephrology- appreciate and agree with management. IR to follow.   Bea Graff Louk, PA-C 01/26/2021, 1:49 PM

## 2021-01-26 NOTE — Progress Notes (Signed)
PROGRESS NOTE  Rebekah Burton [id number] DOB: Aug 21, 1976 DOA: 01/15/2021 PCP: Trey Sailors, PA   LOS: 11 days   Brief Narrative / Interim history: 45 year old female with ESRD on HD MWF, blindness, hypertension, PAD came into the hospital with a breast abscess.  General surgery consulted, she is status post incision and drainage.  She was also incidental Covid, now off contact precautions  Subjective / 24h Interval events: Seen and examined.  No new complaint except some frustration that she expressed over having issues with her dialysis catheter.  Assessment & Plan: Principal Problem Breast abscess -General surgery consulted, she is status post I&D done by general surgery.  She was maintained on Unasyn, preliminary cultures show gram-positive, culture finalized with GPC's and mixed anaerobic flora.  She was transitioned to Augmentin  Active Problems Incidental COVID-19-asymptomatic, no evidence of pulmonary involvement.  She had one positive and one negative test in the last few days, repeat the third test was negative, isolation discontinued on 1/25  ESRD-MWF schedule, nephrology consulted and following.  Having issues with the cannulation of the dialysis catheter.  Vascular consulted.  Anemia of chronic kidney disease-monitor, hemoglobin stable  Hypertension-continue home medications  Disposition-patient will live with her boyfriend following this hospital stay, emergency room.  TOC working on moving her Medicaid from [employer] to [facility].  She will need to have that for transportation to and from dialysis center.  Currently in process.  Scheduled Meds: . carvedilol  25 mg Oral BID WC  . Chlorhexidine Gluconate Cloth  6 each Topical Q0600  . Chlorhexidine Gluconate Cloth  6 each Topical Q0600  . Chlorhexidine Gluconate Cloth  6 each Topical Q0600  . cloNIDine  0.3 mg Oral TID  . darbepoetin (ARANESP) injection - DIALYSIS  100 mcg Intravenous Q Sat-HD  . heparin   5,000 Units Subcutaneous Q8H  . lidocaine-prilocaine  1 application Topical Q M,W,F-HD  . multivitamin  1 tablet Oral QHS  . senna  1 tablet Oral BID  . sevelamer carbonate  3,200 mg Oral TID WC  . sodium chloride flush  3 mL Intravenous Q12H  . thrombin 5,000 units for percutaneous treatment of pseudoaneurysm   Percutaneous Once   Continuous Infusions: . sodium chloride     PRN Meds:.sodium chloride, acetaminophen **OR** [DISCONTINUED] acetaminophen, HYDROcodone-acetaminophen, sodium chloride flush  Diet Orders (From admission, onward)    Start     Ordered   01/25/21 1650  Diet renal with fluid restriction Fluid restriction: 1200 mL Fluid; Room service appropriate? Yes; Fluid consistency: Thin  Diet effective now       Question Answer Comment  Fluid restriction: 1200 mL Fluid   Room service appropriate? Yes   Fluid consistency: Thin      01/25/21 1649          DVT prophylaxis: heparin injection 5,000 Units Start: 01/26/21 0600     Code Status: Full Code  Family Communication: no family at bedside   Status is: Inpatient  Remains inpatient appropriate because:Inpatient level of care appropriate due to severity of illness   Dispo: The patient is from: Home              Anticipated d/c is to: Home              Anticipated d/c date is: 1 day              Patient currently is medically stable to d/c.   Difficult to place patient Yes  Level of care: Med-Surg  Consultants:  General surgery  Nephrology   Procedures:  I&D  Microbiology  GPC in abscess culture  Antimicrobials: Unasyn     Objective: Vitals:   01/25/21 1712 01/25/21 2020 01/26/21 0557 01/26/21 0945  BP: (!) 163/83 (!) 153/89 (!) 182/83 140/81  Pulse: 67 77 75 66  Resp: '20 18 18 14  '$ Temp: 98.6 F (37 C) 98.7 F (37.1 C) 97.9 F (36.6 C) 98.3 F (36.8 C)  TempSrc: Oral   Oral  SpO2: 100% 100% 100% 98%  Weight:      Height:        Intake/Output Summary (Last 24 hours) at 01/26/2021  1228 Last data filed at 01/26/2021 1108 Gross per 24 hour  Intake 840 ml  Output --  Net 840 ml   Filed Weights   01/23/21 0730 01/23/21 1045 01/24/21 1900  Weight: 108.6 kg 108 kg 111 kg    Examination: General exam: Appears calm and comfortable  Respiratory system: Clear to auscultation. Respiratory effort normal. Cardiovascular system: S1 & S2 heard, RRR. No JVD, murmurs, rubs, gallops or clicks. No pedal edema. Gastrointestinal system: Abdomen is nondistended, soft and nontender. No organomegaly or masses felt. Normal bowel sounds heard. Central nervous system: Alert and oriented. No focal neurological deficits. Extremities: Symmetric 5 x 5 power. Skin: No rashes, lesions or ulcers.  Psychiatry: Judgement and insight appear normal. Mood & affect appropriate.   Data Reviewed: I have independently reviewed following labs and imaging studies   CBC: Recent Labs  Lab 01/20/21 0020 01/23/21 0426 01/24/21 0412 01/25/21 0300  WBC 5.8 5.6 5.8 6.2  NEUTROABS 4.3  --   --   --   HGB 7.9* 7.9* 8.1* 8.1*  HCT 25.2* 25.4* 26.6* 26.7*  MCV 96.2 96.2 97.8 97.1  PLT 261 244 236 [id number]   Basic Metabolic Panel: Recent Labs  Lab 01/20/21 0020 01/23/21 0426 01/24/21 0412 01/25/21 0300  NA 134* 134* 134* 133*  K 4.0 4.8 4.2 4.1  CL 90* 92* 95* 93*  CO2 '29 25 26 25  '$ GLUCOSE 88 95 86 108*  BUN 36* 56* 36* 48*  CREATININE 10.17* 13.43* 9.93* 12.01*  CALCIUM 9.1 9.8 9.4 9.6  MG 1.8  --   --   --   PHOS 5.2*  --   --   --    Liver Function Tests: Recent Labs  Lab 01/20/21 0020 01/23/21 0426  AST 23 46*  ALT 15 35  ALKPHOS 37* 42  BILITOT 0.7 0.7  PROT 6.0* 6.0*  ALBUMIN 2.4* 2.6*   Coagulation Profile: No results for input(s): INR, PROTIME in the last 168 hours. HbA1C: No results for input(s): HGBA1C in the last 72 hours. CBG: No results for input(s): GLUCAP in the last 168 hours.  Recent Results (from the past 240 hour(s))  SARS CORONAVIRUS 2 (TAT 6-24 HRS)  Nasopharyngeal Nasopharyngeal Swab     Status: None   Collection Time: 01/18/21  9:13 AM   Specimen: Nasopharyngeal Swab  Result Value Ref Range Status   SARS Coronavirus 2 NEGATIVE NEGATIVE Final    Comment: (NOTE) SARS-CoV-2 target nucleic acids are NOT DETECTED.  The SARS-CoV-2 RNA is generally detectable in upper and lower respiratory specimens during the acute phase of infection. Negative results do not preclude SARS-CoV-2 infection, do not rule out co-infections with other pathogens, and should not be used as the sole basis for treatment or other patient management decisions. Negative results must be combined with clinical observations, patient history, and epidemiological information. The  expected result is Negative.  Fact Sheet for Patients: SugarRoll.be  Fact Sheet for Healthcare Providers: https://www.woods-mathews.com/  This test is not yet approved or cleared by the [place] FDA and  has been authorized for detection and/or diagnosis of SARS-CoV-2 by FDA under an Emergency Use Authorization (EUA). This EUA will remain  in effect (meaning this test can be used) for the duration of the COVID-19 declaration under Se ction 564(b)(1) of the Act, 21 U.S.C. section 360bbb-3(b)(1), unless the authorization is terminated or revoked sooner.  Performed at [facility], [place] [address], [place], [place] [address]   MRSA PCR Screening     Status: None   Collection Time: 01/19/21  2:16 PM   Specimen: Nasal Mucosa; Nasopharyngeal  Result Value Ref Range Status   MRSA by PCR NEGATIVE NEGATIVE Final    Comment:        The GeneXpert MRSA Assay (FDA approved for NASAL specimens only), is one component of a comprehensive MRSA colonization surveillance program. It is not intended to diagnose MRSA infection nor to guide or monitor treatment for MRSA infections. Performed at [facility], [place] [address] [place],  [place] [address]      Radiology Studies: IR US Guide Vasc Access Left  Result Date: 01/26/2021 INDICATION: 45 year old female with history of end-stage renal disease on hemodialysis via left brachiocephalic arteriovenous fistula status post thrombectomy and cephalic stent placement on 10/23/2020. EXAM: 1. Ultrasound-guided vascular access of arteriovenous fistula 2. Fistulagram 3. Ultrasound-guided percutaneous thrombin injection COMPARISON:  10/23/2020 MEDICATIONS: 200 units thrombin CONTRAST:  30 mL Omnipaque 300, intravenous ANESTHESIA/SEDATION: Moderate (conscious) sedation was employed during this procedure. A total of Versed 1 mg and Fentanyl 0 mcg was administered intravenously. Moderate Sedation Time: 0 minutes. The patient's level of consciousness and vital signs were monitored continuously by radiology nursing throughout the procedure under my direct supervision. FLUOROSCOPY TIME:  18 seconds, 68 mGy COMPLICATIONS: None immediate. PROCEDURE: Informed written consent was obtained from the patient after a discussion of the risk, benefits and alternatives to treatment. Questions regarding the procedure were encouraged and answered. A timeout was performed prior to the initiation of the procedure. The left upper arm dialysis graft was prepped with Chlorhexidine in a sterile fashion, and a sterile drape was applied covering the operative field. Preliminary ultrasound evaluation was performed of the arteriovenous graft which demonstrated widely patent arteriovenous anastomosis, patent cephalic outflow stents with surrounding hematoma along the medial aspect with measured up to approximately 2.4 x 1.9 cm. Within the hematoma there is an approximately 1.5 cm x 0.6 cm pseudoaneurysm with turbulent internal flow, which appear to arise from the peripheral aspect of the cephalic stent in the medial portion. There was no measurable neck to the pseudoaneurysm. A diagnostic fistulagram was performed via a 5 [place]  micropuncture sheath introduced into venous outflow, several cm from the arterial anastomosis. Venous drainage was assessed to the level of the central veins in the chest. The angiocath was removed and replaced with a 6-French sheath over a guidewire. A 10 mm by 4 cm mustang balloon was then positioned at the level of the mouth of the pseudoaneurysm. Under continuous ultrasound guidance, the balloon was insufflated and the pseudoaneurysm was punctured percutaneously with a 25 gauge spinal needle. Images were saved in the permanent record. A total of 0.2 mL of 1000 units/milliliter reconstituted thrombin was slowly injected under ultrasound guidance. After approximately 1 minute dwell time, the balloon was slowly deflated. A completion fistulagram was performed.  The sheath was removed and hemostasis obtained with application of a 2-0 Prolene pursestring suture which will be removed at the patient's next dialysis session. A dressing was placed. The patient tolerated the procedure well without immediate postprocedural complication. FINDINGS: Patent brachiocephalic arteriovenous anastomosis and puncture zone. Patent indwelling cephalic vein outflow stents. Hematoma surrounding the cephalic vein stents measuring up to 2.4 cm with internal pseudoaneurysm measuring up to 1.5 cm. 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 Technically successful percutaneous thrombin injection of the pseudoaneurysm arising from the cephalic vein stent with balloon protection of the stent during thrombin injection. IMPRESSION: 1. Hematoma surrounding cephalic vein outflow stent measuring up to 2.4 cm with associated pseudoaneurysm measuring up to 1.5 cm. The fistula is widely patent. Chronically occluded SVC with multiple unchanged, well compensated thoracic collateral veins present. 2. Technically successful  balloon assisted percutaneous thrombin injection into pseudoaneurysm arising from the peripheral aspect of the stented cephalic vein. ACCESS: This access remains amenable to future percutaneous interventions as clinically indicated. Given prior stent placement and central vein occlusion, lower extremity tunneled catheter and surgical referral may be warranted if this access presents as completely thrombosed. Ruthann Cancer, MD [facility] Radiology Electronically Signed   By: Ruthann Cancer MD   On: 01/26/2021 12:15    Darliss Cheney, MD [facility]  Between 7 am - 7 pm I am available, please contact me via Amion or [place]  Between 7 pm - 7 am I am not available, please contact night coverage MD/APP via Amion

## 2021-01-26 NOTE — Progress Notes (Signed)
Renal Navigator received request for hospital records from [place] to add to transfer request from [facility] clinic.  Navigator will continue to follow.  Alphonzo Cruise, [place] Renal Navigator [phone]

## 2021-01-26 NOTE — Progress Notes (Signed)
Patient ID: Rebekah Burton, female   DOB: 07-10-1976, 45 y.o.   MRN: [id number] S: No events overnight.  Result of fistulogram noted. O:BP 140/81 (BP Location: Left Arm)   Pulse 66   Temp 98.3 F (36.8 C) (Oral)   Resp 14   Ht '5\' 4"'$  (1.626 m)   Wt 111 kg   SpO2 98%   BMI 42.00 kg/m   Intake/Output Summary (Last 24 hours) at 01/26/2021 1107 Last data filed at 01/26/2021 0700 Gross per 24 hour  Intake 837 ml  Output --  Net 837 ml   Intake/Output: I/O last 3 completed shifts: In: 837 [P.O.:837] Out: -   Intake/Output this shift:  No intake/output data recorded. Weight change:  Gen: NAD CVS: RRR Resp: cta Abd: +BS, soft, NT/ND Ext: no edema, LUE AVF +T/B but very tender to palpation  Recent Labs  Lab 01/20/21 0020 01/23/21 0426 01/24/21 0412 01/25/21 0300  NA 134* 134* 134* 133*  K 4.0 4.8 4.2 4.1  CL 90* 92* 95* 93*  CO2 '29 25 26 25  '$ GLUCOSE 88 95 86 108*  BUN 36* 56* 36* 48*  CREATININE 10.17* 13.43* 9.93* 12.01*  ALBUMIN 2.4* 2.6*  --   --   CALCIUM 9.1 9.8 9.4 9.6  PHOS 5.2*  --   --   --   AST 23 46*  --   --   ALT 15 35  --   --    Liver Function Tests: Recent Labs  Lab 01/20/21 0020 01/23/21 0426  AST 23 46*  ALT 15 35  ALKPHOS 37* 42  BILITOT 0.7 0.7  PROT 6.0* 6.0*  ALBUMIN 2.4* 2.6*   No results for input(s): LIPASE, AMYLASE in the last 168 hours. No results for input(s): AMMONIA in the last 168 hours. CBC: Recent Labs  Lab 01/20/21 0020 01/23/21 0426 01/24/21 0412 01/25/21 0300  WBC 5.8 5.6 5.8 6.2  NEUTROABS 4.3  --   --   --   HGB 7.9* 7.9* 8.1* 8.1*  HCT 25.2* 25.4* 26.6* 26.7*  MCV 96.2 96.2 97.8 97.1  PLT 261 244 236 217   Cardiac Enzymes: No results for input(s): CKTOTAL, CKMB, CKMBINDEX, TROPONINI in the last 168 hours. CBG: No results for input(s): GLUCAP in the last 168 hours.  Iron Studies: No results for input(s): IRON, TIBC, TRANSFERRIN, FERRITIN in the last 72 hours. Studies/Results: No results found. . carvedilol   25 mg Oral BID WC  . Chlorhexidine Gluconate Cloth  6 each Topical Q0600  . Chlorhexidine Gluconate Cloth  6 each Topical Q0600  . Chlorhexidine Gluconate Cloth  6 each Topical Q0600  . cloNIDine  0.3 mg Oral TID  . darbepoetin (ARANESP) injection - DIALYSIS  100 mcg Intravenous Q Sat-HD  . heparin  5,000 Units Subcutaneous Q8H  . lidocaine-prilocaine  1 application Topical Q M,W,F-HD  . multivitamin  1 tablet Oral QHS  . senna  1 tablet Oral BID  . sevelamer carbonate  3,200 mg Oral TID WC  . sodium chloride flush  3 mL Intravenous Q12H  . thrombin 5,000 units for percutaneous treatment of pseudoaneurysm   Percutaneous Once    BMET    Component Value Date/Time   NA 133 (L) 01/25/2021 0300   K 4.1 01/25/2021 0300   CL 93 (L) 01/25/2021 0300   CO2 25 01/25/2021 0300   GLUCOSE 108 (H) 01/25/2021 0300   BUN 48 (H) 01/25/2021 0300   CREATININE 12.01 (H) 01/25/2021 0300   CALCIUM 9.6 01/25/2021  0300   GFRNONAA 4 (L) 01/25/2021 0300   GFRAA 5 (L) 02/13/2019 0734   CBC    Component Value Date/Time   WBC 6.2 01/25/2021 0300   RBC 2.75 (L) 01/25/2021 0300   HGB 8.1 (L) 01/25/2021 0300   HCT 26.7 (L) 01/25/2021 0300   PLT 217 01/25/2021 0300   MCV 97.1 01/25/2021 0300   MCH 29.5 01/25/2021 0300   MCHC 30.3 01/25/2021 0300   RDW 16.4 (H) 01/25/2021 0300   LYMPHSABS 0.7 01/20/2021 0020   MONOABS 0.5 01/20/2021 0020   EOSABS 0.2 01/20/2021 0020   BASOSABS 0.0 01/20/2021 0020    Dialysis Orders: MWF  4h 24mn 2/2.5 bath 105kg AVF Heparin ? parsabiv 15 mg three times a week with HD mircera 150 mcg every 2 weeks - last given 01/13/21 hectorol 3 mcg three times a week  Assessment/Plan: 1. End-stage renal disease - Tolerating dialysis well, was getting back on MWF schedule but now with difficulty cannulating AVF.  Will plan for HD today and get back on schedule later this week.  2. Vascular access- difficulty with cannulation of arterial portion of AVF.  Has history of  issues with access.  IR consulted 01/25/21 which revealed significant pseudoaneurysm s/p thrombin injection and near complete thrombosis of pseudoaneurysm per IR.  Ok to use AVF.  Appreciate their efforts. 3. Left breast abscess- s/p I&D, abx per primary svc 4. covid-19 infection- off of isolation. 5. Acute hypoxic resp failure due to covid-19 PNA.  symptoms resolved 6. HTN - much better control here than outpatient and pt agrees she feels better with normal BP.HD 01/29 only had Net UF 1 L. Still 3 kg above EDW. No evidence of volume excess by exam. Try to lower volume but EDW may need adjusting. 7. Anemia CKD- Hgb7.9. ESA01/19/22. HGB 7.9. Re-dose ESA today-Aranesp 100 mcg IV.transfuse prn 8. Metabolic bone disease -Phos at goal. Corrected calcium slightly high, hectorol on hold.  not able to get parsabiv here 9. [place] Will notify renal navigator.  JDonetta Potts MD [employer]([phone]

## 2021-01-27 DIAGNOSIS — N611 Abscess of the breast and nipple: Secondary | ICD-10-CM | POA: Diagnosis not present

## 2021-01-27 MED ORDER — DARBEPOETIN ALFA 100 MCG/0.5ML IJ SOSY
100.0000 ug | PREFILLED_SYRINGE | INTRAMUSCULAR | Status: DC
  Administered 2021-02-01: 100 ug via INTRAVENOUS
  Filled 2021-01-27: qty 0.5

## 2021-01-27 MED ORDER — CHLORHEXIDINE GLUCONATE CLOTH 2 % EX PADS
6.0000 | MEDICATED_PAD | Freq: Every day | CUTANEOUS | Status: DC
  Administered 2021-01-27: 6 via TOPICAL

## 2021-01-27 NOTE — Progress Notes (Signed)
Physical Therapy Treatment Patient Details Name: Rebekah Burton MRN: [id number] DOB: 1976/03/07 Today's Date: 01/27/2021    History of Present Illness 45 y.o. female with medical history significant of ESRD-HD M-W-F, blindness, HTN, PAD had developed a breast abscess and was seen in ED 01/04/21 where U/S confirmed abscess. She was to have been seen as outpatient for aspiration/drainage but did not get to make that appt. Today in HD the breast abscess opened and drained spontaneously. Because of continued drainage she presents to [facility] for evaluation. Found to be COVID+ taken to OR for further exploration, culture, and excisional debridement 01/15/21    PT Comments    Patient received in bed, very pleasant and willing to participate in session. Does remarkably well navigating around her room with complete vision impairment, but did need max multimodal cues for navigation in busy hallway. Still mildly unsteady but improved since prior sessions- only needed min guard today for balance/safety. Tolerated significant progression of gait distance this afternoon. Left sitting at EOB with all needs met, RN aware of patient status.     Follow Up Recommendations  No PT follow up;Supervision for mobility/OOB     Equipment Recommendations  None recommended by PT    Recommendations for Other Services       Precautions / Restrictions Precautions Precautions: Other (comment) Precaution Comments: pt is blind, needs assist for navigation Restrictions Weight Bearing Restrictions: No    Mobility  Bed Mobility Overal bed mobility: Modified Independent             General bed mobility comments: use of bed rails and HoB elevated  Transfers Overall transfer level: Modified independent Equipment used: None             General transfer comment: no physical assist given, brief moment of bracing on the bed but improved since last session  Ambulation/Gait Ambulation/Gait assistance: Min  guard Gait Distance (Feet): 200 Feet Assistive device: 1 person hand held assist Gait Pattern/deviations: Step-through pattern;Decreased step length - right;Decreased step length - left Gait velocity: decreased   General Gait Details: min guard for safety/balance and Max cues for navigation in hallway due to complete vision impairment. Less unsteady with gait today, but fatigued at [place] Mobility    Modified Rankin (Stroke Patients Only)       Balance Overall balance assessment: Needs assistance Sitting-balance support: No upper extremity supported;Feet supported Sitting balance-Leahy Scale: Normal     Standing balance support: During functional activity;No upper extremity supported Standing balance-Leahy Scale: Fair                              Cognition Arousal/Alertness: Awake/alert Behavior During Therapy: WFL for tasks assessed/performed Overall Cognitive Status: Within Functional Limits for tasks assessed                                 General Comments: very pleasant and cooperative      Exercises      General Comments        Pertinent Vitals/Pain Pain Assessment: Faces Faces Pain Scale: Hurts even more Pain Location: L UE Pain Descriptors / Indicators: Aching;Sore;Throbbing Pain Intervention(s): Limited activity within patient's tolerance;Monitored during session    Home Living  Prior Function            PT Goals (current goals can now be found in the care plan section) Acute Rehab PT Goals Patient Stated Goal: have less pain PT Goal Formulation: With patient Time For Goal Achievement: 01/31/21 Potential to Achieve Goals: Good Additional Goals Additional Goal #1: Pt will be able to navigate her new home environment with supervision Progress towards PT goals: Progressing toward goals    Frequency    Min 3X/week      PT Plan Current plan remains  appropriate    Co-evaluation              AM-PAC PT "6 Clicks" Mobility   Outcome Measure  Help needed turning from your back to your side while in a flat bed without using bedrails?: None Help needed moving from lying on your back to sitting on the side of a flat bed without using bedrails?: None Help needed moving to and from a bed to a chair (including a wheelchair)?: None Help needed standing up from a chair using your arms (e.g., wheelchair or bedside chair)?: None Help needed to walk in hospital room?: A Little Help needed climbing 3-5 steps with a railing? : A Little 6 Click Score: 22    End of Session Equipment Utilized During Treatment: Gait belt Activity Tolerance: Patient tolerated treatment well Patient left: in bed;with call bell/phone within reach (sitting at EOB per her request) Nurse Communication: Mobility status PT Visit Diagnosis: Other abnormalities of gait and mobility (R26.89);Pain Pain - part of body:  (L breast)     Time: 2419-9144 PT Time Calculation (min) (ACUTE ONLY): 14 min  Charges:  $Gait Training: 8-22 mins                     Windell Norfolk, DPT, PN1   Supplemental Physical Therapist [place]    Pager [phone] Acute Rehab Office [phone]

## 2021-01-27 NOTE — Progress Notes (Addendum)
Rebekah Burton   Subjective:   Reports fistula is still sore but was able to get through dialysis yesterday. Denies SOB, CP, palpitations, abdominal pain and nausea. Still looking for safe discharge plan. Pt is agreeable to ALF.   Objective Vitals:   01/26/21 1930 01/26/21 2010 01/26/21 2112 01/27/21 0512  BP: 133/72 (!) 147/70 (!) 156/89 (!) 151/80  Pulse: 68 (!) 59 73 67  Resp:  '14 16 18  '$ Temp:  98 F (36.7 C) 98.5 F (36.9 C) 98.4 F (36.9 C)  TempSrc:  Oral Oral   SpO2:  98% 100% 97%  Weight:  106 kg    Height:       Physical Exam General: Well developed female, alert and in NAD Heart: RRR, no murmurs, rubs or gallops Lungs: CTA bilaterally without wheezing, rhonchi or rales Abdomen: Soft, non-tender, non-distended, +BS Extremities: No edema b/l lower extremities Dialysis Access:  LUE AVF + t/b, + edema and TTP  Additional Objective Labs: Basic Metabolic Panel: Recent Labs  Lab 01/24/21 0412 01/25/21 0300 01/26/21 1800  NA 134* 133* 133*  K 4.2 4.1 4.3  CL 95* 93* 91*  CO2 '26 25 23  '$ GLUCOSE 86 108* 86  BUN 36* 48* 61*  CREATININE 9.93* 12.01* 14.98*  CALCIUM 9.4 9.6 10.1  PHOS  --   --  6.6*   Liver Function Tests: Recent Labs  Lab 01/23/21 0426 01/26/21 1800  AST 46*  --   ALT 35  --   ALKPHOS 42  --   BILITOT 0.7  --   PROT 6.0*  --   ALBUMIN 2.6* 2.7*   CBC: Recent Labs  Lab 01/23/21 0426 01/24/21 0412 01/25/21 0300 01/26/21 1800  WBC 5.6 5.8 6.2 7.6  HGB 7.9* 8.1* 8.1* 8.0*  HCT 25.4* 26.6* 26.7* 24.7*  MCV 96.2 97.8 97.1 95.0  PLT 244 236 217 242   Blood Culture    Component Value Date/Time   SDES ABSCESS LEFT BREAST 01/16/2021 0009   SPECREQUEST NONE 01/16/2021 0009   CULT  01/16/2021 0009    MIXED ANAEROBIC FLORA PRESENT.  CALL LAB IF FURTHER IID REQUIRED.   REPTSTATUS 01/21/2021 FINAL 01/16/2021 0009    Studies/Results: IR US Guide Vasc Access Left  Result Date: 01/26/2021 INDICATION:  45 year old female with history of end-stage renal disease on hemodialysis via left brachiocephalic arteriovenous fistula status post thrombectomy and cephalic stent placement on 10/23/2020. EXAM: 1. Ultrasound-guided vascular access of arteriovenous fistula 2. Fistulagram 3. Ultrasound-guided percutaneous thrombin injection COMPARISON:  10/23/2020 MEDICATIONS: 200 units thrombin CONTRAST:  30 mL Omnipaque 300, intravenous ANESTHESIA/SEDATION: Moderate (conscious) sedation was employed during this procedure. A total of Versed 1 mg and Fentanyl 0 mcg was administered intravenously. Moderate Sedation Time: 0 minutes. The patient's level of consciousness and vital signs were monitored continuously by radiology nursing throughout the procedure under my direct supervision. FLUOROSCOPY TIME:  18 seconds, 68 mGy COMPLICATIONS: None immediate. PROCEDURE: Informed written consent was obtained from the patient after a discussion of the risk, benefits and alternatives to treatment. Questions regarding the procedure were encouraged and answered. A timeout was performed prior to the initiation of the procedure. The left upper arm dialysis graft was prepped with Chlorhexidine in a sterile fashion, and a sterile drape was applied covering the operative field. Preliminary ultrasound evaluation was performed of the arteriovenous graft which demonstrated widely patent arteriovenous anastomosis, patent cephalic outflow stents with surrounding hematoma along the medial aspect with measured up to approximately 2.4 x 1.9  cm. Within the hematoma there is an approximately 1.5 cm x 0.6 cm pseudoaneurysm with turbulent internal flow, which appear to arise from the peripheral aspect of the cephalic stent in the medial portion. There was no measurable neck to the pseudoaneurysm. A diagnostic fistulagram was performed via a 5 [place] micropuncture sheath introduced into venous outflow, several cm from the arterial anastomosis. Venous drainage  was assessed to the level of the central veins in the chest. The angiocath was removed and replaced with a 6-French sheath over a guidewire. A 10 mm by 4 cm mustang balloon was then positioned at the level of the mouth of the pseudoaneurysm. Under continuous ultrasound guidance, the balloon was insufflated and the pseudoaneurysm was punctured percutaneously with a 25 gauge spinal needle. Images were saved in the permanent record. A total of 0.2 mL of 1000 units/milliliter reconstituted thrombin was slowly injected under ultrasound guidance. After approximately 1 minute dwell time, the balloon was slowly deflated. A completion fistulagram was performed. The sheath was removed and hemostasis obtained with application of a 2-0 Prolene pursestring suture which will be removed at the patient's next dialysis session. A dressing was placed. The patient tolerated the procedure well without immediate postprocedural complication. FINDINGS: Patent brachiocephalic arteriovenous anastomosis and puncture zone. Patent indwelling cephalic vein outflow stents. Hematoma surrounding the cephalic vein stents measuring up to 2.4 cm with internal pseudoaneurysm measuring up to 1.5 cm. 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 Technically successful percutaneous thrombin injection of the pseudoaneurysm arising from the cephalic vein stent with balloon protection of the stent during thrombin injection. IMPRESSION: 1. Hematoma surrounding cephalic vein outflow stent measuring up to 2.4 cm with associated pseudoaneurysm measuring up to 1.5 cm. The fistula is widely patent. Chronically occluded SVC with multiple unchanged, well compensated thoracic collateral veins present. 2. Technically successful balloon assisted percutaneous thrombin injection into pseudoaneurysm arising from the peripheral aspect of the  stented cephalic vein. ACCESS: This access remains amenable to future percutaneous interventions as clinically indicated. Given prior stent placement and central vein occlusion, lower extremity tunneled catheter and surgical referral may be warranted if this access presents as completely thrombosed. Rebekah Cancer, Rebekah [facility] Electronically Signed   By: Rebekah Burton   On: 01/26/2021 12:15   Rebekah Burton, AVG)  Result Date: 01/26/2021 DIALYSIS ACCESS Reason for Exam: S/P thrombin of AVF Pseudo. Access Site: Left Upper Extremity. Access Type: Brachial-cephalic AVF. Limitations: Bandages distal upper Comparison Study: No previous Performing Technologist: Vonzell Schlatter RVT  Examination Guidelines: A complete evaluation includes B-mode imaging, spectral Doppler, color Doppler, and power Doppler as needed of all accessible portions of each vessel. Unilateral testing is considered an integral part of a complete examination. Limited examinations for reoccurring indications may be performed as noted.  Findings:   +--------------------+----------+-----------------+-----------------+ AVG                 PSV (cm/s)Flow Vol (mL/min)    Describe      +--------------------+----------+-----------------+-----------------+ Native artery inflow                           bandages not seen +--------------------+----------+-----------------+-----------------+ Arterial anastomosis                           bandages not seen +--------------------+----------+-----------------+-----------------+ Prox  graft                           188             stent       +--------------------+----------+-----------------+-----------------+ Mid graft              206          3517             stent       +--------------------+----------+-----------------+-----------------+ Distal graft           151                           stent        +--------------------+----------+-----------------+-----------------+ Venous outflow         216                          patent       +--------------------+----------+-----------------+-----------------+   --------------------------------------------------------------------------------   Preliminary    Medications: . sodium chloride     . carvedilol  25 mg Oral BID WC  . Chlorhexidine Gluconate Cloth  6 each Topical Q0600  . Chlorhexidine Gluconate Cloth  6 each Topical Q0600  . Chlorhexidine Gluconate Cloth  6 each Topical Q0600  . cloNIDine  0.3 mg Oral TID  . darbepoetin (ARANESP) injection - DIALYSIS  100 mcg Intravenous Q Sat-HD  . heparin  5,000 Units Subcutaneous Q8H  . lidocaine-prilocaine  1 application Topical Q M,W,F-HD  . multivitamin  1 tablet Oral QHS  . senna  1 tablet Oral BID  . sevelamer carbonate  3,200 mg Oral TID WC  . sodium chloride flush  3 mL Intravenous Q12H  . thrombin 5,000 units for percutaneous treatment of pseudoaneurysm   Percutaneous Once    Dialysis Orders: MWF  4h 52mn 2/2.5 bath 105kg AVF Heparin ? parsabiv 15 mg three times a week with HD mircera 150 mcg every 2 weeks - last given 01/13/21 hectorol 3 mcg three times a week  Assessment/Plan: 1. End-stage renal disease -Typically MWF. Needed fistulogram on Monday so now off schedule, last HD was Tuesday. Will let arm rest today and plan for next HD tomorrow, try to get back on schedule on Friday.  2. Vascular access- difficulty with cannulation of arterial portion of AVF. Has history of issues with access. IR consulted 01/25/21 which revealed significant pseudoaneurysm s/p thrombin injection and near complete thrombosis of pseudoaneurysm per IR.  Used AVF yesterday with some pain but otherwise no issues.  3. Left breast abscess- s/p I&D, abx per primary svc 4. covid-19 infection- off of isolation. 5. HTN-much better control here than outpatient and pt agrees she feels better with  normal BP.Close to her outpatient EDW, may need to be raised a bit.  6. Anemia CKD-Hgb 8.0.Continue Aranesp 100 mcg IV. Last dose was Saturday, will adjust schedule to q Monday.transfuse prn 7. Metabolic bone disease-Phos at goal. Corrected calcium slightly high, hectorol on hold.not able to get parsabiv here  8. Social Issues: Safe d/c plan pending, now considering ALF   SAnice Paganini PA-C 01/27/2021, 10:06 AM  [facility] Pager: [phone] I have seen and examined this patient and agree with plan and assessment in the above Burton with renal recommendations/intervention highlighted.  Difficult social issues. Appreciate CM/SW and renal navigator assistance.  JGovernor Rooks  Iyannah Blake,Rebekah Burton 01/27/2021 2:44 PM

## 2021-01-27 NOTE — Care Management Important Message (Signed)
Important Message  Patient Details  Name: Rebekah Burton MRN: [id number] Date of Birth: 03/01/1976   Medicare Important Message Given:  Yes - Important Message mailed due to current National Emergency  Verbal consent obtained due to current National Emergency  Relationship to patient: Self Contact Name: Alizandra Canipe Call Date: 01/27/21  Time: 1417 Phone: [phone] Outcome: Spoke with contact Important Message mailed to: Patient address on file    Delorse Lek 01/27/2021, 2:17 PM

## 2021-01-27 NOTE — Progress Notes (Signed)
PROGRESS NOTE  Rebekah Burton [id number] DOB: 08-15-1976 DOA: 01/15/2021 PCP: Trey Sailors, PA   LOS: 12 days   Brief Narrative / Interim history: 45 year old female with ESRD on HD MWF, blindness, hypertension, PAD came into the hospital with a breast abscess.  General surgery consulted, she is status post incision and drainage.  She was also incidental Covid, now off contact precautions  Subjective / 24h Interval events: Seen and examined.  Complains of pain in the left arm/at fistula site as well as left breast with no change from yesterday.  No other complaint.  She confirms with me that now she has changed her mind/plans and she is not going to live with her boyfriend anymore and at the same time she has no other place to go.  Assessment & Plan: Principal Problem Breast abscess -General surgery consulted, she is status post I&D done by general surgery.  She was maintained on Unasyn, preliminary cultures show gram-positive, culture finalized with GPC's and mixed anaerobic flora.  She was transitioned to Augmentin and completed course.  Active Problems Incidental COVID-19-asymptomatic, no evidence of pulmonary involvement.  She had one positive and one negative test in the last few days, repeat the third test was negative, isolation discontinued on 1/25  ESRD-MWF schedule, nephrology consulted and following.  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  IR and vascular following.  Dialysis per nephrology.  Anemia of chronic kidney disease-monitor, hemoglobin stable  Hypertension-continue home medications  Disposition-patient's relative did not want her to come back due to COVID-19.  She then decided to leave with her boyfriend in [place].  Process was a started to move her Medicaid/dialysis from [facility] to [facility] and process was anticipated to be completed on 01/26/2021 however  right then, she decided to change her mind and she is not going to live with the boyfriend anymore and at the same time she does not have any other place at this point in time.  TOC is on board and working to find a placement for her.  Scheduled Meds: . carvedilol  25 mg Oral BID WC  . Chlorhexidine Gluconate Cloth  6 each Topical Q0600  . cloNIDine  0.3 mg Oral TID  . [START ON 02/01/2021] darbepoetin (ARANESP) injection - DIALYSIS  100 mcg Intravenous Q Mon-HD  . heparin  5,000 Units Subcutaneous Q8H  . lidocaine-prilocaine  1 application Topical Q M,W,F-HD  . multivitamin  1 tablet Oral QHS  . senna  1 tablet Oral BID  . sevelamer carbonate  3,200 mg Oral TID WC  . sodium chloride flush  3 mL Intravenous Q12H  . thrombin 5,000 units for percutaneous treatment of pseudoaneurysm   Percutaneous Once   Continuous Infusions: . sodium chloride     PRN Meds:.sodium chloride, acetaminophen **OR** [DISCONTINUED] acetaminophen, HYDROcodone-acetaminophen, sodium chloride flush  Diet Orders (From admission, onward)    Start     Ordered   01/25/21 1650  Diet renal with fluid restriction Fluid restriction: 1200 mL Fluid; Room service appropriate? Yes; Fluid consistency: Thin  Diet effective now       Question Answer Comment  Fluid restriction: 1200 mL Fluid   Room service appropriate? Yes   Fluid consistency: Thin      01/25/21 1649          DVT prophylaxis: heparin injection 5,000 Units Start: 01/26/21 0600     Code Status: Full Code  Family Communication: no  family at bedside   Status is: Inpatient  Remains inpatient appropriate because:Inpatient level of care appropriate due to severity of illness   Dispo: The patient is from: Home              Anticipated d/c is to: Home              Anticipated d/c date is: 1 day              Patient currently is medically stable to d/c.   Difficult to place patient Yes  Level of care: Med-Surg  Consultants:  General surgery   Nephrology   Procedures:  I&D  Microbiology  GPC in abscess culture  Antimicrobials: Unasyn   Augmentin Completed course for both of the antibiotics.  Objective: Vitals:   01/26/21 2010 01/26/21 2112 01/27/21 0512 01/27/21 1030  BP: (!) 147/70 (!) 156/89 (!) 151/80 138/84  Pulse: (!) 59 73 67 70  Resp: '14 16 18 16  '$ Temp: 98 F (36.7 C) 98.5 F (36.9 C) 98.4 F (36.9 C) 98.8 F (37.1 C)  TempSrc: Oral Oral    SpO2: 98% 100% 97% 98%  Weight: 106 kg     Height:        Intake/Output Summary (Last 24 hours) at 01/27/2021 1415 Last data filed at 01/27/2021 1300 Gross per 24 hour  Intake 814 ml  Output 2500 ml  Net -1686 ml   Filed Weights   01/24/21 1900 01/26/21 1712 01/26/21 2010  Weight: 111 kg 108.6 kg 106 kg    Examination: General exam: Appears calm and comfortable  Respiratory system: Clear to auscultation. Respiratory effort normal. Cardiovascular system: S1 & S2 heard, RRR. No JVD, murmurs, rubs, gallops or clicks. No pedal edema. Gastrointestinal system: Abdomen is nondistended, soft and nontender. No organomegaly or masses felt. Normal bowel sounds heard. Central nervous system: Alert and oriented. No focal neurological deficits. Extremities: Symmetric 5 x 5 power. Skin: No rashes, lesions or ulcers.  Psychiatry: Judgement and insight appear normal. Mood & affect appropriate.    Data Reviewed: I have independently reviewed following labs and imaging studies   CBC: Recent Labs  Lab 01/23/21 0426 01/24/21 0412 01/25/21 0300 01/26/21 1800  WBC 5.6 5.8 6.2 7.6  HGB 7.9* 8.1* 8.1* 8.0*  HCT 25.4* 26.6* 26.7* 24.7*  MCV 96.2 97.8 97.1 95.0  PLT 244 236 217 [id number]   Basic Metabolic Panel: Recent Labs  Lab 01/23/21 0426 01/24/21 0412 01/25/21 0300 01/26/21 1800  NA 134* 134* 133* 133*  K 4.8 4.2 4.1 4.3  CL 92* 95* 93* 91*  CO2 '25 26 25 23  '$ GLUCOSE 95 86 108* 86  BUN 56* 36* 48* 61*  CREATININE 13.43* 9.93* 12.01* 14.98*  CALCIUM 9.8 9.4 9.6  10.1  PHOS  --   --   --  6.6*   Liver Function Tests: Recent Labs  Lab 01/23/21 0426 01/26/21 1800  AST 46*  --   ALT 35  --   ALKPHOS 42  --   BILITOT 0.7  --   PROT 6.0*  --   ALBUMIN 2.6* 2.7*   Coagulation Profile: No results for input(s): INR, PROTIME in the last 168 hours. HbA1C: No results for input(s): HGBA1C in the last 72 hours. CBG: No results for input(s): GLUCAP in the last 168 hours.  Recent Results (from the past 240 hour(s))  SARS CORONAVIRUS 2 (TAT 6-24 HRS) Nasopharyngeal Nasopharyngeal Swab     Status: None   Collection Time: 01/18/21  9:13 AM   Specimen: Nasopharyngeal Swab  Result Value Ref Range Status   SARS Coronavirus 2 NEGATIVE NEGATIVE Final    Comment: (NOTE) SARS-CoV-2 target nucleic acids are NOT DETECTED.  The SARS-CoV-2 RNA is generally detectable in upper and lower respiratory specimens during the acute phase of infection. Negative results do not preclude SARS-CoV-2 infection, do not rule out co-infections with other pathogens, and should not be used as the sole basis for treatment or other patient management decisions. Negative results must be combined with clinical observations, patient history, and epidemiological information. The expected result is Negative.  Fact Sheet for Patients: SugarRoll.be  Fact Sheet for Healthcare Providers: https://www.woods-mathews.com/  This test is not yet approved or cleared by the [place] FDA and  has been authorized for detection and/or diagnosis of SARS-CoV-2 by FDA under an Emergency Use Authorization (EUA). This EUA will remain  in effect (meaning this test can be used) for the duration of the COVID-19 declaration under Se ction 564(b)(1) of the Act, 21 U.S.C. section 360bbb-3(b)(1), unless the authorization is terminated or revoked sooner.  Performed at [facility], [place] [address] [place], [place] [address]   MRSA PCR Screening      Status: None   Collection Time: 01/19/21  2:16 PM   Specimen: Nasal Mucosa; Nasopharyngeal  Result Value Ref Range Status   MRSA by PCR NEGATIVE NEGATIVE Final    Comment:        The GeneXpert MRSA Assay (FDA approved for NASAL specimens only), is one component of a comprehensive MRSA colonization surveillance program. It is not intended to diagnose MRSA infection nor to guide or monitor treatment for MRSA infections. Performed at [facility], [place] [address] [place],  [address]      Radiology Studies: VAS US DUPLEX DIALYSIS ACCESS (AVF, AVG)  Result Date: 01/26/2021 DIALYSIS ACCESS Reason for Exam: S/P thrombin of AVF Pseudo. Access Site: Left Upper Extremity. Access Type: Brachial-cephalic AVF. Limitations: Bandages distal upper Comparison Study: No previous Performing Technologist: Vonzell Schlatter RVT  Examination Guidelines: A complete evaluation includes B-mode imaging, spectral Doppler, color Doppler, and power Doppler as needed of all accessible portions of each vessel. Unilateral testing is considered an integral part of a complete examination. Limited examinations for reoccurring indications may be performed as noted.  Findings:   +--------------------+----------+-----------------+-----------------+ AVG                 PSV (cm/s)Flow Vol (mL/min)    Describe      +--------------------+----------+-----------------+-----------------+ Native artery inflow                           bandages not seen +--------------------+----------+-----------------+-----------------+ Arterial anastomosis                           bandages not seen +--------------------+----------+-----------------+-----------------+ Prox graft                           188             stent       +--------------------+----------+-----------------+-----------------+ Mid graft              206          3517             stent        +--------------------+----------+-----------------+-----------------+ Distal graft  151                           stent       +--------------------+----------+-----------------+-----------------+ Venous outflow         216                          patent       +--------------------+----------+-----------------+-----------------+   --------------------------------------------------------------------------------   Preliminary     Darliss Cheney, MD [facility]  Between 7 am - 7 pm I am available, please contact me via Amion or Securechat  Between 7 pm - 7 am I am not available, please contact night coverage MD/APP via Amion

## 2021-01-27 NOTE — Progress Notes (Signed)
Interventional Radiology Brief Note:  Patient assessed this afternoon at bedside.  Reports tenderness in the LUE at the site of fistula access.  She did undergo duplex of her fistula yesterday which was reviewed by Dr. Serafina Royals.  Pseudoaneurysm appears thrombosed with adequate flow.  There is a stable hematoma which corresponds to her area of tenderness. She was able to undergo successful session of dialysis yesterday without flow issues.   Continue with conservative and supportive management for hematoma.  IR remains available as needed.   Brynda Greathouse, MS RD PA-C

## 2021-01-28 ENCOUNTER — Encounter (HOSPITAL_COMMUNITY): Payer: Self-pay

## 2021-01-28 DIAGNOSIS — N611 Abscess of the breast and nipple: Secondary | ICD-10-CM | POA: Diagnosis not present

## 2021-01-28 HISTORY — PX: IR US GUIDE BX ASP/DRAIN: IMG2392

## 2021-01-28 HISTORY — PX: IR DIALY SHUNT INTRO NEEDLE/INTRACATH INITIAL W/IMG LEFT: IMG6102

## 2021-01-28 LAB — CBC
HCT: 26.1 % — ABNORMAL LOW (ref 36.0–46.0)
Hemoglobin: 7.9 g/dL — ABNORMAL LOW (ref 12.0–15.0)
MCH: 29.3 pg (ref 26.0–34.0)
MCHC: 30.3 g/dL (ref 30.0–36.0)
MCV: 96.7 fL (ref 80.0–100.0)
Platelets: 246 10*3/uL (ref 150–400)
RBC: 2.7 MIL/uL — ABNORMAL LOW (ref 3.87–5.11)
RDW: 17.2 % — ABNORMAL HIGH (ref 11.5–15.5)
WBC: 5.6 10*3/uL (ref 4.0–10.5)
nRBC: 0 % (ref 0.0–0.2)

## 2021-01-28 LAB — RENAL FUNCTION PANEL
Albumin: 2.8 g/dL — ABNORMAL LOW (ref 3.5–5.0)
Anion gap: 15 (ref 5–15)
BUN: 46 mg/dL — ABNORMAL HIGH (ref 6–20)
CO2: 26 mmol/L (ref 22–32)
Calcium: 10.1 mg/dL (ref 8.9–10.3)
Chloride: 95 mmol/L — ABNORMAL LOW (ref 98–111)
Creatinine, Ser: 13.22 mg/dL — ABNORMAL HIGH (ref 0.44–1.00)
GFR, Estimated: 3 mL/min — ABNORMAL LOW (ref >60)
Glucose, Bld: 112 mg/dL — ABNORMAL HIGH (ref 70–99)
Phosphorus: 5.8 mg/dL — ABNORMAL HIGH (ref 2.5–4.6)
Potassium: 4 mmol/L (ref 3.5–5.1)
Sodium: 136 mmol/L (ref 135–145)

## 2021-01-28 MED ORDER — ACETAMINOPHEN 325 MG PO TABS
ORAL_TABLET | ORAL | Status: AC
  Filled 2021-01-28: qty 2

## 2021-01-28 NOTE — Plan of Care (Signed)
  Problem: Education: Goal: Knowledge of General Education information will improve Description: Including pain rating scale, medication(s)/side effects and non-pharmacologic comfort measures Outcome: Progressing   Problem: Coping: Goal: Level of anxiety will decrease Outcome: Progressing   Problem: Pain Managment: Goal: General experience of comfort will improve Outcome: Progressing   

## 2021-01-28 NOTE — Procedures (Signed)
I was present at this dialysis session. I have reviewed the session itself and made appropriate changes.   Vital signs in last 24 hours:  Temp:  [97.6 F (36.4 C)-99.5 F (37.5 C)] 97.6 F (36.4 C) (02/03 0456) Pulse Rate:  [65-70] 65 (02/03 0456) Resp:  [16-18] 16 (02/03 0456) BP: (135-146)/(71-84) 146/71 (02/03 0456) SpO2:  [97 %-99 %] 97 % (02/03 0456) Weight change:  Filed Weights   01/24/21 1900 01/26/21 1712 01/26/21 2010  Weight: 111 kg 108.6 kg 106 kg    Recent Labs  Lab 01/26/21 1800  NA 133*  K 4.3  CL 91*  CO2 23  GLUCOSE 86  BUN 61*  CREATININE 14.98*  CALCIUM 10.1  PHOS 6.6*    Recent Labs  Lab 01/24/21 0412 01/25/21 0300 01/26/21 1800  WBC 5.8 6.2 7.6  HGB 8.1* 8.1* 8.0*  HCT 26.6* 26.7* 24.7*  MCV 97.8 97.1 95.0  PLT 236 217 242    Scheduled Meds: . acetaminophen      . carvedilol  25 mg Oral BID WC  . Chlorhexidine Gluconate Cloth  6 each Topical Q0600  . cloNIDine  0.3 mg Oral TID  . [START ON 02/01/2021] darbepoetin (ARANESP) injection - DIALYSIS  100 mcg Intravenous Q Mon-HD  . heparin  5,000 Units Subcutaneous Q8H  . lidocaine-prilocaine  1 application Topical Q M,W,F-HD  . multivitamin  1 tablet Oral QHS  . senna  1 tablet Oral BID  . sevelamer carbonate  3,200 mg Oral TID WC  . sodium chloride flush  3 mL Intravenous Q12H   Continuous Infusions: . sodium chloride     PRN Meds:.sodium chloride, acetaminophen **OR** [DISCONTINUED] acetaminophen, HYDROcodone-acetaminophen, sodium chloride flush    Dialysis Orders: MWF  4h 23mn 2/2.5 bath 105kg AVF Heparin ? parsabiv 15 mg three times a week with HD mircera 150 mcg every 2 weeks - last given 01/13/21 hectorol 3 mcg three times a week  Assessment/Plan: 1. End-stage renal disease -Typically MWF. Needed fistulogram on Monday so now off schedule, last HD was Tuesday. try to get back on schedule on Friday.  2. Vascular access- difficulty with cannulation of arterial portion of  AVF. Has history of issues with access.IR consulted 01/25/21 which revealed significant pseudoaneurysm s/p thrombin injection and near complete thrombosis of pseudoaneurysm per IR. Able to use AVF today without issues. 3. Left breast abscess- s/p I&D, abx per primary svc 4. covid-19 infection- off of isolation. 5. HTN-much better control here than outpatient and pt agrees she feels better with normal BP.Close to her outpatient EDW, may need to be raised a bit.  6. Anemia CKD-Hgb 8.0.Continue Aranesp 100 mcg IV. Last dose was Saturday, will adjust schedule to q Monday.transfuse prn 7. Metabolic bone disease-Phos at goal. Corrected calcium slightly high, hectorol on hold.not able to get parsabiv here  8. Social Issues: Safe d/c plan pending, now considering ALF   JDonetta Potts  MD 01/28/2021, 8:21 AM

## 2021-01-28 NOTE — Progress Notes (Signed)
PROGRESS NOTE  Rebekah Burton [id number] DOB: 04-11-1976 DOA: 01/15/2021 PCP: Trey Sailors, PA   LOS: 13 days   Brief Narrative / Interim history: 45 year old female with ESRD on HD MWF, blindness, hypertension, PAD came into the hospital with a breast abscess.  General surgery consulted, she is status post incision and drainage.  She was also incidental Covid, now off contact precautions  Subjective / 24h Interval events: Patient seen and examined.  She is in dialysis.  Complains of left arm pain at the fistula.  No other complaint.  Assessment & Plan: Principal Problem Breast abscess -General surgery consulted, she is status post I&D done by general surgery.  She was maintained on Unasyn, preliminary cultures show gram-positive, culture finalized with GPC's and mixed anaerobic flora.  She was transitioned to Augmentin and completed course.  Active Problems Incidental COVID-19-asymptomatic, no evidence of pulmonary involvement.  She had one positive and one negative test in the last few days, repeat the third test was negative, isolation discontinued on 1/25  ESRD-MWF schedule, nephrology consulted and following.  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  IR and vascular following.  Dialysis per nephrology.  Anemia of chronic kidney disease-monitor, hemoglobin stable  Hypertension-continue home medications  Disposition-patient's relative, where she was living before did not want her to come back due to COVID-19.  She then decided to leave with her boyfriend in [place].  Process was a started to move her Medicaid/dialysis from [facility] to [facility] and process was anticipated to be completed on 01/26/2021 however right then, she decided to change her mind and she is not going to live with the boyfriend anymore and at the same time she does not have any other place at this point in  time.  TOC is on board and working to find a placement for her.  Scheduled Meds: . acetaminophen      . carvedilol  25 mg Oral BID WC  . Chlorhexidine Gluconate Cloth  6 each Topical Q0600  . cloNIDine  0.3 mg Oral TID  . [START ON 02/01/2021] darbepoetin (ARANESP) injection - DIALYSIS  100 mcg Intravenous Q Mon-HD  . heparin  5,000 Units Subcutaneous Q8H  . lidocaine-prilocaine  1 application Topical Q M,W,F-HD  . multivitamin  1 tablet Oral QHS  . senna  1 tablet Oral BID  . sevelamer carbonate  3,200 mg Oral TID WC  . sodium chloride flush  3 mL Intravenous Q12H   Continuous Infusions: . sodium chloride     PRN Meds:.sodium chloride, acetaminophen **OR** [DISCONTINUED] acetaminophen, HYDROcodone-acetaminophen, sodium chloride flush  Diet Orders (From admission, onward)    Start     Ordered   01/25/21 1650  Diet renal with fluid restriction Fluid restriction: 1200 mL Fluid; Room service appropriate? Yes; Fluid consistency: Thin  Diet effective now       Question Answer Comment  Fluid restriction: 1200 mL Fluid   Room service appropriate? Yes   Fluid consistency: Thin      01/25/21 1649          DVT prophylaxis: heparin injection 5,000 Units Start: 01/26/21 0600     Code Status: Full Code  Family Communication: no family at bedside   Status is: Inpatient  Remains inpatient appropriate because:Inpatient level of care appropriate due to severity of illness   Dispo: The patient is from: Home  Anticipated d/c is to: Home              Anticipated d/c date is: 1 day              Patient currently is medically stable to d/c.   Difficult to place patient Yes  Level of care: Med-Surg  Consultants:  General surgery  Nephrology   Procedures:  I&D  Microbiology  GPC in abscess culture  Antimicrobials: Unasyn   Augmentin Completed course for both of the antibiotics.  Objective: Vitals:   01/28/21 1000 01/28/21 1030 01/28/21 1035 01/28/21 1149  BP:  (!) 88/74 112/74 (!) 146/87 137/76  Pulse: 74 70 70 71  Resp:   16 18  Temp:   98.6 F (37 C) 98.2 F (36.8 C)  TempSrc:   Oral Oral  SpO2:   98% 98%  Weight:   106.5 kg   Height:        Intake/Output Summary (Last 24 hours) at 01/28/2021 1242 Last data filed at 01/28/2021 1035 Gross per 24 hour  Intake 711 ml  Output 2000 ml  Net -1289 ml   Filed Weights   01/26/21 2010 01/28/21 0800 01/28/21 1035  Weight: 106 kg 108 kg 106.5 kg    Examination: General exam: Appears calm and comfortable  Respiratory system: Clear to auscultation. Respiratory effort normal. Cardiovascular system: S1 & S2 heard, RRR. No JVD, murmurs, rubs, gallops or clicks. No pedal edema. Gastrointestinal system: Abdomen is nondistended, soft and nontender. No organomegaly or masses felt. Normal bowel sounds heard. Central nervous system: Alert and oriented. No focal neurological deficits. Extremities: Symmetric 5 x 5 power. Skin: No rashes, lesions or ulcers.  Psychiatry: Judgement and insight appear normal. Mood & affect appropriate.   Data Reviewed: I have independently reviewed following labs and imaging studies   CBC: Recent Labs  Lab 01/23/21 0426 01/24/21 0412 01/25/21 0300 01/26/21 1800 01/28/21 0817  WBC 5.6 5.8 6.2 7.6 5.6  HGB 7.9* 8.1* 8.1* 8.0* 7.9*  HCT 25.4* 26.6* 26.7* 24.7* 26.1*  MCV 96.2 97.8 97.1 95.0 96.7  PLT 244 236 217 242 0000000   Basic Metabolic Panel: Recent Labs  Lab 01/23/21 0426 01/24/21 0412 01/25/21 0300 01/26/21 1800 01/28/21 0817  NA 134* 134* 133* 133* 136  K 4.8 4.2 4.1 4.3 4.0  CL 92* 95* 93* 91* 95*  CO2 '25 26 25 23 26  '$ GLUCOSE 95 86 108* 86 112*  BUN 56* 36* 48* 61* 46*  CREATININE 13.43* 9.93* 12.01* 14.98* 13.22*  CALCIUM 9.8 9.4 9.6 10.1 10.1  PHOS  --   --   --  6.6* 5.8*   Liver Function Tests: Recent Labs  Lab 01/23/21 0426 01/26/21 1800 01/28/21 0817  AST 46*  --   --   ALT 35  --   --   ALKPHOS 42  --   --   BILITOT 0.7  --   --    PROT 6.0*  --   --   ALBUMIN 2.6* 2.7* 2.8*   Coagulation Profile: No results for input(s): INR, PROTIME in the last 168 hours. HbA1C: No results for input(s): HGBA1C in the last 72 hours. CBG: No results for input(s): GLUCAP in the last 168 hours.  Recent Results (from the past 240 hour(s))  MRSA PCR Screening     Status: None   Collection Time: 01/19/21  2:16 PM   Specimen: Nasal Mucosa; Nasopharyngeal  Result Value Ref Range Status   MRSA by PCR NEGATIVE NEGATIVE Final  Comment:        The GeneXpert MRSA Assay (FDA approved for NASAL specimens only), is one component of a comprehensive MRSA colonization surveillance program. It is not intended to diagnose MRSA infection nor to guide or monitor treatment for MRSA infections. Performed at [facility], [place] [address], [place], [place] [address]      Radiology Studies: IR US Guide Vasc Access Left  Result Date: 01/26/2021 INDICATION: 45 year old female with history of end-stage renal disease on hemodialysis via left brachiocephalic arteriovenous fistula status post thrombectomy and cephalic stent placement on 10/23/2020. EXAM: 1. Ultrasound-guided vascular access of arteriovenous fistula 2. Fistulagram 3. Ultrasound-guided percutaneous thrombin injection COMPARISON:  10/23/2020 MEDICATIONS: 200 units thrombin CONTRAST:  30 mL Omnipaque 300, intravenous ANESTHESIA/SEDATION: Moderate (conscious) sedation was employed during this procedure. A total of Versed 1 mg and Fentanyl 0 mcg was administered intravenously. Moderate Sedation Time: 0 minutes. The patient's level of consciousness and vital signs were monitored continuously by radiology nursing throughout the procedure under my direct supervision. FLUOROSCOPY TIME:  18 seconds, 68 mGy COMPLICATIONS: None immediate. PROCEDURE: Informed written consent was obtained from the patient after a discussion of the risk, benefits and alternatives to treatment. Questions regarding the  procedure were encouraged and answered. A timeout was performed prior to the initiation of the procedure. The left upper arm dialysis graft was prepped with Chlorhexidine in a sterile fashion, and a sterile drape was applied covering the operative field. Preliminary ultrasound evaluation was performed of the arteriovenous graft which demonstrated widely patent arteriovenous anastomosis, patent cephalic outflow stents with surrounding hematoma along the medial aspect with measured up to approximately 2.4 x 1.9 cm. Within the hematoma there is an approximately 1.5 cm x 0.6 cm pseudoaneurysm with turbulent internal flow, which appear to arise from the peripheral aspect of the cephalic stent in the medial portion. There was no measurable neck to the pseudoaneurysm. A diagnostic fistulagram was performed via a 5 [place] micropuncture sheath introduced into venous outflow, several cm from the arterial anastomosis. Venous drainage was assessed to the level of the central veins in the chest. The angiocath was removed and replaced with a 6-French sheath over a guidewire. A 10 mm by 4 cm mustang balloon was then positioned at the level of the mouth of the pseudoaneurysm. Under continuous ultrasound guidance, the balloon was insufflated and the pseudoaneurysm was punctured percutaneously with a 25 gauge spinal needle. Images were saved in the permanent record. A total of 0.2 mL of 1000 units/milliliter reconstituted thrombin was slowly injected under ultrasound guidance. After approximately 1 minute dwell time, the balloon was slowly deflated. A completion fistulagram was performed. The sheath was removed and hemostasis obtained with application of a 2-0 Prolene pursestring suture which will be removed at the patient's next dialysis session. A dressing was placed. The patient tolerated the procedure well without immediate postprocedural complication. FINDINGS: Patent brachiocephalic arteriovenous anastomosis and puncture zone.  Patent indwelling cephalic vein outflow stents. Hematoma surrounding the cephalic vein stents measuring up to 2.4 cm with internal pseudoaneurysm measuring up to 1.5 cm. 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 Technically successful percutaneous thrombin injection of the pseudoaneurysm arising from the cephalic vein stent with balloon protection of the stent during thrombin injection. IMPRESSION: 1. Hematoma surrounding cephalic vein outflow stent measuring up to 2.4 cm with associated pseudoaneurysm measuring up to 1.5 cm. The fistula is widely patent. Chronically occluded  SVC with multiple unchanged, well compensated thoracic collateral veins present. 2. Technically successful balloon assisted percutaneous thrombin injection into pseudoaneurysm arising from the peripheral aspect of the stented cephalic vein. ACCESS: This access remains amenable to future percutaneous interventions as clinically indicated. Given prior stent placement and central vein occlusion, lower extremity tunneled catheter and surgical referral may be warranted if this access presents as completely thrombosed. Ruthann Cancer, MD [facility] Radiology Electronically Signed   By: Ruthann Cancer MD   On: 01/26/2021 12:15   IR US Guide Bx Asp/Drain  Result Date: 01/28/2021 INDICATION: 45 year old female with history of end-stage renal disease on hemodialysis via left brachiocephalic arteriovenous fistula status post thrombectomy and cephalic stent placement on 10/23/2020. EXAM: 1. Ultrasound-guided vascular access of arteriovenous fistula 2. Fistulagram 3. Ultrasound-guided percutaneous thrombin injection COMPARISON:  10/23/2020 MEDICATIONS: 200 units thrombin CONTRAST:  30 mL Omnipaque 300, intravenous ANESTHESIA/SEDATION: Moderate (conscious) sedation was employed  during this procedure. A total of Versed 1 mg and Fentanyl 0 mcg was administered intravenously. Moderate Sedation Time: 0 minutes. The patient's level of consciousness and vital signs were monitored continuously by radiology nursing throughout the procedure under my direct supervision. FLUOROSCOPY TIME:  18 seconds, 68 mGy COMPLICATIONS: None immediate. PROCEDURE: Informed written consent was obtained from the patient after a discussion of the risk, benefits and alternatives to treatment. Questions regarding the procedure were encouraged and answered. A timeout was performed prior to the initiation of the procedure. The left upper arm dialysis graft was prepped with Chlorhexidine in a sterile fashion, and a sterile drape was applied covering the operative field. Preliminary ultrasound evaluation was performed of the arteriovenous graft which demonstrated widely patent arteriovenous anastomosis, patent cephalic outflow stents with surrounding hematoma along the medial aspect with measured up to approximately 2.4 x 1.9 cm. Within the hematoma there is an approximately 1.5 cm x 0.6 cm pseudoaneurysm with turbulent internal flow, which appear to arise from the peripheral aspect of the cephalic stent in the medial portion. There was no measurable neck to the pseudoaneurysm. A diagnostic fistulagram was performed via a 5 [place] micropuncture sheath introduced into venous outflow, several cm from the arterial anastomosis. Venous drainage was assessed to the level of the central veins in the chest. The angiocath was removed and replaced with a 6-French sheath over a guidewire. A 10 mm by 4 cm mustang balloon was then positioned at the level of the mouth of the pseudoaneurysm. Under continuous ultrasound guidance, the balloon was insufflated and the pseudoaneurysm was punctured percutaneously with a 25 gauge spinal needle. Images were saved in the permanent record. A total of 0.2 mL of 1000 units/milliliter reconstituted  thrombin was slowly injected under ultrasound guidance. After approximately 1 minute dwell time, the balloon was slowly deflated. A completion fistulagram was performed. The sheath was removed and hemostasis obtained with application of a 2-0 Prolene pursestring suture which will be removed at the patient's next dialysis session. A dressing was placed. The patient tolerated the procedure well without immediate postprocedural complication. FINDINGS: Patent brachiocephalic arteriovenous anastomosis and puncture zone. Patent indwelling cephalic vein outflow stents. Hematoma surrounding the cephalic vein stents measuring up to 2.4 cm with internal pseudoaneurysm measuring up to 1.5 cm. 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 Technically successful percutaneous thrombin injection of the pseudoaneurysm arising from the cephalic vein stent with balloon protection of the stent during thrombin injection. IMPRESSION:  1. Hematoma surrounding cephalic vein outflow stent measuring up to 2.4 cm with associated pseudoaneurysm measuring up to 1.5 cm. The fistula is widely patent. Chronically occluded SVC with multiple unchanged, well compensated thoracic collateral veins present. 2. Technically successful balloon assisted percutaneous thrombin injection into pseudoaneurysm arising from the peripheral aspect of the stented cephalic vein. ACCESS: This access remains amenable to future percutaneous interventions as clinically indicated. Given prior stent placement and central vein occlusion, lower extremity tunneled catheter and surgical referral may be warranted if this access presents as completely thrombosed. Ruthann Cancer, MD [facility] Radiology Electronically Signed   By: Ruthann Cancer MD   On: 01/26/2021 12:15   IR DIALY SHUNT INTRO  NEEDLE/INTRACATH INITIAL W/IMG LEFT  Result Date: 01/28/2021 INDICATION: 45 year old female with history of end-stage renal disease on hemodialysis via left brachiocephalic arteriovenous fistula status post thrombectomy and cephalic stent placement on 10/23/2020.  EXAM: 1. Ultrasound-guided vascular access of arteriovenous fistula 2. Fistulagram 3. Ultrasound-guided percutaneous thrombin injection  COMPARISON:  10/23/2020  MEDICATIONS: 200 units thrombin  CONTRAST:  30 mL Omnipaque 300, intravenous  ANESTHESIA/SEDATION: Moderate (conscious) sedation was employed during this procedure. A total of Versed 1 mg and Fentanyl 0 mcg was administered intravenously.  Moderate Sedation Time: 0 minutes. The patient's level of consciousness and vital signs were monitored continuously by radiology nursing throughout the procedure under my direct supervision.  FLUOROSCOPY TIME:  18 seconds, 68 mGy  COMPLICATIONS: None immediate.  PROCEDURE: Informed written consent was obtained from the patient after a discussion of the risk, benefits and alternatives to treatment. Questions regarding the procedure were encouraged and answered. A timeout was performed prior to the initiation of the procedure.  The left upper arm dialysis graft was prepped with Chlorhexidine in a sterile fashion, and a sterile drape was applied covering the operative field.  Preliminary ultrasound evaluation was performed of the arteriovenous graft which demonstrated widely patent arteriovenous anastomosis, patent cephalic outflow stents with surrounding hematoma along the medial aspect with measured up to approximately 2.4 x 1.9 cm. Within the hematoma there is an approximately 1.5 cm x 0.6 cm pseudoaneurysm with turbulent internal flow, which appear to arise from the peripheral aspect of the cephalic stent in the medial portion. There was no measurable neck to the pseudoaneurysm.  A diagnostic fistulagram was performed via a 5 [place] micropuncture  sheath introduced into venous outflow, several cm from the arterial anastomosis. Venous drainage was assessed to the level of the central veins in the chest.  The angiocath was removed and replaced with a 6-French sheath over a guidewire. A 10 mm by 4 cm mustang balloon was then positioned at the level of the mouth of the pseudoaneurysm. Under continuous ultrasound guidance, the balloon was insufflated and the pseudoaneurysm was punctured percutaneously with a 25 gauge spinal needle. Images were saved in the permanent record. A total of 0.2 mL of 1000 units/milliliter reconstituted thrombin was slowly injected under ultrasound guidance. After approximately 1 minute dwell time, the balloon was slowly deflated. A completion fistulagram was performed.  The sheath was removed and hemostasis obtained with application of a 2-0 Prolene pursestring suture which will be removed at the patient's next dialysis session. A dressing was placed. The patient tolerated the procedure well without immediate postprocedural complication.  FINDINGS: Patent brachiocephalic arteriovenous anastomosis and puncture zone. Patent indwelling cephalic vein outflow stents. Hematoma surrounding the cephalic vein stents measuring up to 2.4 cm with internal pseudoaneurysm measuring up to 1.5 cm. 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 Technically successful percutaneous thrombin injection of the pseudoaneurysm arising from the cephalic vein stent with balloon protection of the stent during thrombin injection.  IMPRESSION: 1. Hematoma surrounding cephalic vein outflow stent measuring up to 2.4 cm with associated pseudoaneurysm measuring up to 1.5 cm. The fistula is widely patent. Chronically occluded SVC with multiple unchanged, well compensated thoracic collateral veins present. 2. Technically successful balloon  assisted percutaneous thrombin injection into pseudoaneurysm arising from the peripheral aspect of the stented cephalic vein.  ACCESS: This access remains amenable to future percutaneous interventions as clinically indicated. Given prior stent placement and central vein occlusion, lower extremity tunneled catheter and surgical referral may be warranted if this access presents as completely thrombosed.  Ruthann Cancer, MD  [facility]  [facility] Radiology   Electronically Signed   Darliss Cheney, MD [facility]  Between 7 am - 7 pm I am available, please contact me via Amion or [place]  Between 7 pm - 7 am I am not available, please contact night coverage MD/APP via Amion

## 2021-01-29 DIAGNOSIS — N611 Abscess of the breast and nipple: Secondary | ICD-10-CM | POA: Diagnosis not present

## 2021-01-29 MED ORDER — CHLORHEXIDINE GLUCONATE CLOTH 2 % EX PADS: 6.0000 | MEDICATED_PAD | Freq: Every day | CUTANEOUS | Status: DC

## 2021-01-29 NOTE — Plan of Care (Signed)
  Problem: Education: Goal: Knowledge of General Education information will improve Description: Including pain rating scale, medication(s)/side effects and non-pharmacologic comfort measures Outcome: Progressing   Problem: Pain Managment: Goal: General experience of comfort will improve Outcome: Progressing   

## 2021-01-29 NOTE — TOC Progression Note (Signed)
Transition of Care [facility]) - Progression Note    Patient Details  Name: Rebekah Burton MRN: [id number] Date of Birth: [birth year]  Transition of [facility]) CM/SW Contact  Bartholomew Crews, RN Phone Number: [phone] 01/29/2021, 11:00 AM  Clinical Narrative:     Received call back from Cherene Altes, [place] social worker. Patient's Medicaid is still in [facility]. Georgetta to work on stopping transfer process.   Contact made to [facility]. Received call back - suggested to call [employer] to discuss hotel option with assistance to establish long term housing option. Spoke with Legrand Como to discuss situation - email sent to Legrand Como with patient information.   Spoke with patient at the bedside to discuss possible options. Patient is appreciative for assistance.   TOC following for transition needs.    Expected Discharge Plan: [place] Services Barriers to Discharge: Other (comment) (working on dialysis and Medicaid transfers to [employer])  Expected Discharge Plan and Services Expected Discharge Plan: [place]   Discharge Planning Services: CM Consult Post Acute Care Choice: [place] arrangements for the past 2 months: Single Family Home                 DME Arranged: N/A DME Agency: NA       HH Arranged: RN [place] Agency: [facility] Date [place]: 01/21/21 Time [place]: [id number] Representative spoke with at [place]: [place] ([place]) Interventions    Readmission Risk Interventions No flowsheet data found.

## 2021-01-29 NOTE — Progress Notes (Signed)
Physical Therapy Treatment Patient Details Name: Rebekah Burton MRN: [id number] DOB: 12/03/1976 Today's Date: 01/29/2021    History of Present Illness 45 y.o. female with medical history significant of ESRD-HD M-W-F, blindness, HTN, PAD had developed a breast abscess and was seen in ED 01/04/21 where U/S confirmed abscess. She was to have been seen as outpatient for aspiration/drainage but did not get to make that appt. Today in HD the breast abscess opened and drained spontaneously. Because of continued drainage she presents to [facility] for evaluation. Found to be COVID+ taken to OR for further exploration, culture, and excisional debridement 01/15/21    PT Comments    Patient received in bed, pleasant and cooperative. Able to progress gait distance and demonstrated better dynamic balance however feel she would still struggle with challenges such as head turns or randomized external perturbations- will plan to trial next session. Easily fatigued but very motivated to improve. Discussed resources such as phone and tablet apps for the blind to help navigate new environments and speciality PT/OT focused around compensating for vision loss. Left sitting at EOB with all needs met. Progressing well.    Follow Up Recommendations  No PT follow up;Supervision for mobility/OOB     Equipment Recommendations  None recommended by PT    Recommendations for Other Services       Precautions / Restrictions Precautions Precautions: Other (comment) Precaution Comments: pt is blind, needs assist for navigation, L breast abscess Restrictions Weight Bearing Restrictions: No    Mobility  Bed Mobility Overal bed mobility: Modified Independent             General bed mobility comments: use of bed rails and HoB elevated  Transfers Overall transfer level: Modified independent Equipment used: None             General transfer comment: no physical assist given, good hand placement/safety  awareness  Ambulation/Gait Ambulation/Gait assistance: Min guard Gait Distance (Feet): 225 Feet Assistive device: None Gait Pattern/deviations: Step-through pattern;Decreased step length - right;Decreased step length - left;Drifts right/left Gait velocity: decreased   General Gait Details: min guard for safety, balance much better today. Still with need for max cues for navigation in hallway due to vision impairment. 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/blindness                            Cognition Arousal/Alertness: Awake/alert Behavior During Therapy: WFL for tasks assessed/performed Overall Cognitive Status: Within Functional Limits for tasks assessed                                 General Comments: very pleasant and cooperative      Exercises      General Comments        Pertinent Vitals/Pain Pain Assessment: Faces Faces Pain Scale: Hurts little more Pain Location: L UE Pain Intervention(s): Limited activity within patient's tolerance;Monitored during session    Home Living  Prior Function            PT Goals (current goals can now be found in the care plan section) Acute Rehab PT Goals Patient Stated Goal: have less pain PT Goal Formulation: With patient Time For Goal Achievement: 01/31/21 Potential to Achieve Goals: Good Additional Goals Additional Goal #1: Pt will be able to navigate her new home environment with  supervision Progress towards PT goals: Progressing toward goals    Frequency    Min 3X/week      PT Plan Current plan remains appropriate    Co-evaluation              AM-PAC PT "6 Clicks" Mobility   Outcome Measure  Help needed turning from your back to your side while in a flat bed without using bedrails?: None Help needed moving from lying on your back to sitting on the side of a flat bed without using bedrails?: None Help needed moving to and from a bed to a chair (including a wheelchair)?: None Help needed standing up from a chair using your arms (e.g., wheelchair or bedside chair)?: None Help needed to walk in hospital room?: A Little Help needed climbing 3-5 steps with a railing? : A Little 6 Click Score: 22    End of Session Equipment Utilized During Treatment: Gait belt Activity Tolerance: Patient tolerated treatment well Patient left: in bed;with call bell/phone within reach (sitting at EOB per her request) Nurse Communication: Mobility status PT Visit Diagnosis: Other abnormalities of gait and mobility (R26.89);Pain Pain - part of body:  (L breast and UE)     Time: 9167-5612 PT Time Calculation (min) (ACUTE ONLY): 17 min  Charges:  $Gait Training: 8-22 mins                     Windell Norfolk, DPT, PN1   Supplemental Physical Therapist [place]    Pager [phone] Acute Rehab Office [phone]

## 2021-01-29 NOTE — Progress Notes (Addendum)
[facility] Progress Note   Subjective:   Patient seen and examined at bedside.  Reports pain in arm improving.  Denies CP, SOB, edema, orthopnea, n/v/d, weakness and fatigue. Still looking for safe d/c plan.  Objective Vitals:   01/28/21 1622 01/28/21 2032 01/29/21 0513 01/29/21 0907  BP: (!) 149/86 (!) 159/95 (!) 154/83 119/76  Pulse: 76 73 62 66  Resp: '18 20 18 16  '$ Temp: 98.7 F (37.1 C) 98.7 F (37.1 C) 97.9 F (36.6 C) 98.5 F (36.9 C)  TempSrc:  Oral    SpO2: 96% 99% 97% 98%  Weight:      Height:       Physical Exam General:chronically ill appearing female in NAD Heart:RRR Lungs:CTAB, nml WOB Abdomen:soft, NTND Extremities:no LE edema, b/l TMA Dialysis Access: LU AVF +b/t   Filed Weights   01/26/21 2010 01/28/21 0800 01/28/21 1035  Weight: 106 kg 108 kg 106.5 kg    Intake/Output Summary (Last 24 hours) at 01/29/2021 1129 Last data filed at 01/29/2021 0801 Gross per 24 hour  Intake 387 ml  Output --  Net 387 ml    Additional Objective Labs: Basic Metabolic Panel: Recent Labs  Lab 01/25/21 0300 01/26/21 1800 01/28/21 0817  NA 133* 133* 136  K 4.1 4.3 4.0  CL 93* 91* 95*  CO2 '25 23 26  '$ GLUCOSE 108* 86 112*  BUN 48* 61* 46*  CREATININE 12.01* 14.98* 13.22*  CALCIUM 9.6 10.1 10.1  PHOS  --  6.6* 5.8*   Liver Function Tests: Recent Labs  Lab 01/23/21 0426 01/26/21 1800 01/28/21 0817  AST 46*  --   --   ALT 35  --   --   ALKPHOS 42  --   --   BILITOT 0.7  --   --   PROT 6.0*  --   --   ALBUMIN 2.6* 2.7* 2.8*   CBC: Recent Labs  Lab 01/23/21 0426 01/24/21 0412 01/25/21 0300 01/26/21 1800 01/28/21 0817  WBC 5.6 5.8 6.2 7.6 5.6  HGB 7.9* 8.1* 8.1* 8.0* 7.9*  HCT 25.4* 26.6* 26.7* 24.7* 26.1*  MCV 96.2 97.8 97.1 95.0 96.7  PLT 244 236 217 242 246    Studies/Results: IR US Guide Vasc Access Left  Result Date: 01/26/2021 INDICATION: 45 year old female with history of end-stage renal disease on hemodialysis via left  brachiocephalic arteriovenous fistula status post thrombectomy and cephalic stent placement on 10/23/2020. EXAM: 1. Ultrasound-guided vascular access of arteriovenous fistula 2. Fistulagram 3. Ultrasound-guided percutaneous thrombin injection COMPARISON:  10/23/2020 MEDICATIONS: 200 units thrombin CONTRAST:  30 mL Omnipaque 300, intravenous ANESTHESIA/SEDATION: Moderate (conscious) sedation was employed during this procedure. A total of Versed 1 mg and Fentanyl 0 mcg was administered intravenously. Moderate Sedation Time: 0 minutes. The patient's level of consciousness and vital signs were monitored continuously by radiology nursing throughout the procedure under my direct supervision. FLUOROSCOPY TIME:  18 seconds, 68 mGy COMPLICATIONS: None immediate. PROCEDURE: Informed written consent was obtained from the patient after a discussion of the risk, benefits and alternatives to treatment. Questions regarding the procedure were encouraged and answered. A timeout was performed prior to the initiation of the procedure. The left upper arm dialysis graft was prepped with Chlorhexidine in a sterile fashion, and a sterile drape was applied covering the operative field. Preliminary ultrasound evaluation was performed of the arteriovenous graft which demonstrated widely patent arteriovenous anastomosis, patent cephalic outflow stents with surrounding hematoma along the medial aspect with measured up to approximately 2.4 x 1.9 cm.  Within the hematoma there is an approximately 1.5 cm x 0.6 cm pseudoaneurysm with turbulent internal flow, which appear to arise from the peripheral aspect of the cephalic stent in the medial portion. There was no measurable neck to the pseudoaneurysm. A diagnostic fistulagram was performed via a 5 [place] micropuncture sheath introduced into venous outflow, several cm from the arterial anastomosis. Venous drainage was assessed to the level of the central veins in the chest. The angiocath was removed  and replaced with a 6-French sheath over a guidewire. A 10 mm by 4 cm mustang balloon was then positioned at the level of the mouth of the pseudoaneurysm. Under continuous ultrasound guidance, the balloon was insufflated and the pseudoaneurysm was punctured percutaneously with a 25 gauge spinal needle. Images were saved in the permanent record. A total of 0.2 mL of 1000 units/milliliter reconstituted thrombin was slowly injected under ultrasound guidance. After approximately 1 minute dwell time, the balloon was slowly deflated. A completion fistulagram was performed. The sheath was removed and hemostasis obtained with application of a 2-0 Prolene pursestring suture which will be removed at the patient's next dialysis session. A dressing was placed. The patient tolerated the procedure well without immediate postprocedural complication. FINDINGS: Patent brachiocephalic arteriovenous anastomosis and puncture zone. Patent indwelling cephalic vein outflow stents. Hematoma surrounding the cephalic vein stents measuring up to 2.4 cm with internal pseudoaneurysm measuring up to 1.5 cm. 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 Technically successful percutaneous thrombin injection of the pseudoaneurysm arising from the cephalic vein stent with balloon protection of the stent during thrombin injection. IMPRESSION: 1. Hematoma surrounding cephalic vein outflow stent measuring up to 2.4 cm with associated pseudoaneurysm measuring up to 1.5 cm. The fistula is widely patent. Chronically occluded SVC with multiple unchanged, well compensated thoracic collateral veins present. 2. Technically successful balloon assisted percutaneous thrombin injection into pseudoaneurysm arising from the peripheral aspect of the stented cephalic vein. ACCESS: This access remains amenable to future percutaneous  interventions as clinically indicated. Given prior stent placement and central vein occlusion, lower extremity tunneled catheter and surgical referral may be warranted if this access presents as completely thrombosed. Ruthann Cancer, MD [facility] Radiology Electronically Signed   By: Ruthann Cancer MD   On: 01/26/2021 12:15   IR US Guide Bx Asp/Drain  Result Date: 01/28/2021 INDICATION: 45 year old female with history of end-stage renal disease on hemodialysis via left brachiocephalic arteriovenous fistula status post thrombectomy and cephalic stent placement on 10/23/2020. EXAM: 1. Ultrasound-guided vascular access of arteriovenous fistula 2. Fistulagram 3. Ultrasound-guided percutaneous thrombin injection COMPARISON:  10/23/2020 MEDICATIONS: 200 units thrombin CONTRAST:  30 mL Omnipaque 300, intravenous ANESTHESIA/SEDATION: Moderate (conscious) sedation was employed during this procedure. A total of Versed 1 mg and Fentanyl 0 mcg was administered intravenously. Moderate Sedation Time: 0 minutes. The patient's level of consciousness and vital signs were monitored continuously by radiology nursing throughout the procedure under my direct supervision. FLUOROSCOPY TIME:  18 seconds, 68 mGy COMPLICATIONS: None immediate. PROCEDURE: Informed written consent was obtained from the patient after a discussion of the risk, benefits and alternatives to treatment. Questions regarding the procedure were encouraged and answered. A timeout was performed prior to the initiation of the procedure. The left upper arm dialysis graft was prepped with Chlorhexidine in a sterile fashion, and a sterile drape was applied covering the operative field. Preliminary ultrasound evaluation was performed of the arteriovenous  graft which demonstrated widely patent arteriovenous anastomosis, patent cephalic outflow stents with surrounding hematoma along the medial aspect with measured up to  approximately 2.4 x 1.9 cm. Within the hematoma there is an approximately 1.5 cm x 0.6 cm pseudoaneurysm with turbulent internal flow, which appear to arise from the peripheral aspect of the cephalic stent in the medial portion. There was no measurable neck to the pseudoaneurysm. A diagnostic fistulagram was performed via a 5 [place] micropuncture sheath introduced into venous outflow, several cm from the arterial anastomosis. Venous drainage was assessed to the level of the central veins in the chest. The angiocath was removed and replaced with a 6-French sheath over a guidewire. A 10 mm by 4 cm mustang balloon was then positioned at the level of the mouth of the pseudoaneurysm. Under continuous ultrasound guidance, the balloon was insufflated and the pseudoaneurysm was punctured percutaneously with a 25 gauge spinal needle. Images were saved in the permanent record. A total of 0.2 mL of 1000 units/milliliter reconstituted thrombin was slowly injected under ultrasound guidance. After approximately 1 minute dwell time, the balloon was slowly deflated. A completion fistulagram was performed. The sheath was removed and hemostasis obtained with application of a 2-0 Prolene pursestring suture which will be removed at the patient's next dialysis session. A dressing was placed. The patient tolerated the procedure well without immediate postprocedural complication. FINDINGS: Patent brachiocephalic arteriovenous anastomosis and puncture zone. Patent indwelling cephalic vein outflow stents. Hematoma surrounding the cephalic vein stents measuring up to 2.4 cm with internal pseudoaneurysm measuring up to 1.5 cm. 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 Technically successful percutaneous thrombin injection of the pseudoaneurysm arising from the cephalic vein stent with balloon protection of the  stent during thrombin injection. IMPRESSION: 1. Hematoma surrounding cephalic vein outflow stent measuring up to 2.4 cm with associated pseudoaneurysm measuring up to 1.5 cm. The fistula is widely patent. Chronically occluded SVC with multiple unchanged, well compensated thoracic collateral veins present. 2. Technically successful balloon assisted percutaneous thrombin injection into pseudoaneurysm arising from the peripheral aspect of the stented cephalic vein. ACCESS: This access remains amenable to future percutaneous interventions as clinically indicated. Given prior stent placement and central vein occlusion, lower extremity tunneled catheter and surgical referral may be warranted if this access presents as completely thrombosed. Ruthann Cancer, MD [facility] Radiology Electronically Signed   By: Ruthann Cancer MD   On: 01/26/2021 12:15   IR DIALY SHUNT INTRO NEEDLE/INTRACATH INITIAL W/IMG LEFT  Result Date: 01/28/2021 INDICATION: 45 year old female with history of end-stage renal disease on hemodialysis via left brachiocephalic arteriovenous fistula status post thrombectomy and cephalic stent placement on 10/23/2020.  EXAM: 1. Ultrasound-guided vascular access of arteriovenous fistula 2. Fistulagram 3. Ultrasound-guided percutaneous thrombin injection  COMPARISON:  10/23/2020  MEDICATIONS: 200 units thrombin  CONTRAST:  30 mL Omnipaque 300, intravenous  ANESTHESIA/SEDATION: Moderate (conscious) sedation was employed during this procedure. A total of Versed 1 mg and Fentanyl 0 mcg was administered intravenously.  Moderate Sedation Time: 0 minutes. The patient's level of consciousness and vital signs were monitored continuously by radiology nursing throughout the procedure under my direct supervision.  FLUOROSCOPY TIME:  18 seconds, 68 mGy  COMPLICATIONS: None immediate.  PROCEDURE: Informed written consent was obtained from the patient after a  discussion of the risk, benefits and alternatives to treatment. Questions regarding the procedure were encouraged and answered. A timeout was  performed prior to the initiation of the procedure.  The left upper arm dialysis graft was prepped with Chlorhexidine in a sterile fashion, and a sterile drape was applied covering the operative field.  Preliminary ultrasound evaluation was performed of the arteriovenous graft which demonstrated widely patent arteriovenous anastomosis, patent cephalic outflow stents with surrounding hematoma along the medial aspect with measured up to approximately 2.4 x 1.9 cm. Within the hematoma there is an approximately 1.5 cm x 0.6 cm pseudoaneurysm with turbulent internal flow, which appear to arise from the peripheral aspect of the cephalic stent in the medial portion. There was no measurable neck to the pseudoaneurysm.  A diagnostic fistulagram was performed via a 5 [place] micropuncture sheath introduced into venous outflow, several cm from the arterial anastomosis. Venous drainage was assessed to the level of the central veins in the chest.  The angiocath was removed and replaced with a 6-French sheath over a guidewire. A 10 mm by 4 cm mustang balloon was then positioned at the level of the mouth of the pseudoaneurysm. Under continuous ultrasound guidance, the balloon was insufflated and the pseudoaneurysm was punctured percutaneously with a 25 gauge spinal needle. Images were saved in the permanent record. A total of 0.2 mL of 1000 units/milliliter reconstituted thrombin was slowly injected under ultrasound guidance. After approximately 1 minute dwell time, the balloon was slowly deflated. A completion fistulagram was performed.  The sheath was removed and hemostasis obtained with application of a 2-0 Prolene pursestring suture which will be removed at the patient's next dialysis session. A dressing was placed. The patient tolerated the procedure well without immediate  postprocedural complication.  FINDINGS: Patent brachiocephalic arteriovenous anastomosis and puncture zone. Patent indwelling cephalic vein outflow stents. Hematoma surrounding the cephalic vein stents measuring up to 2.4 cm with internal pseudoaneurysm measuring up to 1.5 cm. 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 Technically successful percutaneous thrombin injection of the pseudoaneurysm arising from the cephalic vein stent with balloon protection of the stent during thrombin injection.  IMPRESSION: 1. Hematoma surrounding cephalic vein outflow stent measuring up to 2.4 cm with associated pseudoaneurysm measuring up to 1.5 cm. The fistula is widely patent. Chronically occluded SVC with multiple unchanged, well compensated thoracic collateral veins present. 2. Technically successful balloon assisted percutaneous thrombin injection into pseudoaneurysm arising from the peripheral aspect of the stented cephalic vein.  ACCESS: This access remains amenable to future percutaneous interventions as clinically indicated. Given prior stent placement and central vein occlusion, lower extremity tunneled catheter and surgical referral may be warranted if this access presents as completely thrombosed.  Ruthann Cancer, MD  [facility]  [facility] Radiology   Electronically Signed   Medications: . sodium chloride     . carvedilol  25 mg Oral BID WC  . Chlorhexidine Gluconate Cloth  6 each Topical Q0600  . cloNIDine  0.3 mg Oral TID  . [START ON 02/01/2021] darbepoetin (ARANESP) injection - DIALYSIS  100 mcg Intravenous Q Mon-HD  . heparin  5,000 Units Subcutaneous Q8H  . lidocaine-prilocaine  1 application Topical Q M,W,F-HD  . multivitamin  1 tablet Oral QHS  . senna  1 tablet Oral BID  . sevelamer carbonate  3,200 mg Oral TID WC  . sodium  chloride flush  3 mL Intravenous Q12H    Dialysis Orders: MWF  4h 12mn 2/2.5 bath 105kg AVF Heparin ? parsabiv 15 mg three times a  week with HD mircera 150 mcg every 2 weeks - last given 01/13/21 hectorol 3 mcg three times a week  Assessment/Plan: 1. End-stage renal disease -Typically MWF, off schedule this week.  Last HD yesterday using AVF.  Plan for HD tomorrow and resume regular schedule on Monday.  2. Vascular access- difficulty with cannulation of arterial portion of AVF. Has history of issues with access.IR consulted 01/25/21 which revealed significant pseudoaneurysm s/p thrombin injection and near complete thrombosis of pseudoaneurysm per IR. Used AVF yesterday with some pain but otherwise no issues.  3. Left breast abscess- s/p I&D, abx per primary svc 4. covid-19 infection- off of isolation. 5. HTN-much better control here than outpatient and pt agrees she feels better with normal BP.Close to her outpatient EDW, may need to be raised a bit. Does not appear volume overloaded. UF as tolerated.  6. Anemia CKD-Hgb 7.9.Continue Aranesp 100 mcg IV. Last dose was Saturday, will adjust schedule to q Monday.transfuse prn 7. Metabolic bone disease-Phos at goal. Corrected calcium high, hectorol on hold, use low Ca bath. Not able to get parsabiv here  8. Social Issues: Safe d/c plan pending, now considering ALF   Jen Mow, PA-C [place] 01/29/2021,11:29 AM  LOS: 14 days   I have seen and examined this patient and agree with plan and assessment in the above note with renal recommendations/intervention highlighted.  Pending disposition.  Off schedule and will plan for HD tomorrow and then get back on schedule Monday.  Broadus John A Korianna Washer,MD 01/29/2021 1:41 PM

## 2021-01-29 NOTE — Progress Notes (Signed)
PROGRESS NOTE  Rebekah Burton [id number] DOB: [birth year] DOA: 01/15/2021 PCP: Trey Sailors, PA   LOS: 14 days   Brief Narrative / Interim history: 45 year old female with ESRD on HD MWF, blindness, hypertension, PAD came into the hospital with a breast abscess.  General surgery consulted, she is status post incision and drainage.  She was also incidental Covid, now off contact precautions  Subjective / 24h Interval events: Patient seen and examined.  No new complaint.  Pain in the left arm improving.  Assessment & Plan: Principal Problem Breast abscess -General surgery consulted, she is status post I&D done by general surgery.  She was maintained on Unasyn, preliminary cultures show gram-positive, culture finalized with GPC's and mixed anaerobic flora.  She was transitioned to Augmentin and completed course.  Active Problems Incidental COVID-19-asymptomatic, no evidence of pulmonary involvement.  She had one positive and one negative test in the last few days, repeat the third test was negative, isolation discontinued on 1/25  ESRD-MWF schedule, nephrology consulted and following.  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  IR and vascular consulted.  aVF now working.  Dialysis per nephrology.  Anemia of chronic kidney disease-monitor, hemoglobin stable  Hypertension-continue home medications  Disposition-patient's relative, where she was living before did not want her to come back due to COVID-19.  She then decided to leave with her boyfriend in [place].  Process was a started to move her Medicaid/dialysis from [facility] to [facility] and process was anticipated to be completed on 01/26/2021 however right then, she decided to change her mind and she is not going to live with the boyfriend anymore and at the same time she does not have any other place at this point in time.  TOC is on  board and working to find a placement for her.  Scheduled Meds: . carvedilol  25 mg Oral BID WC  . Chlorhexidine Gluconate Cloth  6 each Topical Q0600  . [START ON 01/30/2021] Chlorhexidine Gluconate Cloth  6 each Topical Q0600  . cloNIDine  0.3 mg Oral TID  . [START ON 02/01/2021] darbepoetin (ARANESP) injection - DIALYSIS  100 mcg Intravenous Q Mon-HD  . heparin  5,000 Units Subcutaneous Q8H  . lidocaine-prilocaine  1 application Topical Q M,W,F-HD  . multivitamin  1 tablet Oral QHS  . senna  1 tablet Oral BID  . sevelamer carbonate  3,200 mg Oral TID WC  . sodium chloride flush  3 mL Intravenous Q12H   Continuous Infusions: . sodium chloride     PRN Meds:.sodium chloride, acetaminophen **OR** [DISCONTINUED] acetaminophen, HYDROcodone-acetaminophen, sodium chloride flush  Diet Orders (From admission, onward)    Start     Ordered   01/25/21 1650  Diet renal with fluid restriction Fluid restriction: 1200 mL Fluid; Room service appropriate? Yes; Fluid consistency: Thin  Diet effective now       Question Answer Comment  Fluid restriction: 1200 mL Fluid   Room service appropriate? Yes   Fluid consistency: Thin      01/25/21 1649          DVT prophylaxis: heparin injection 5,000 Units Start: 01/26/21 0600     Code Status: Full Code  Family Communication: no family at bedside   Status is: Inpatient  Remains inpatient appropriate because: Unsafe discharge plan  Dispo: The patient is from: Home              Anticipated d/c is  to: Home              Anticipated d/c date is: 1 day              Patient currently is medically stable to d/c.   Difficult to place patient Yes  Level of care: Med-Surg  Consultants:  General surgery  Nephrology   Procedures:  I&D  Microbiology  GPC in abscess culture  Antimicrobials: Unasyn   Augmentin Completed course for both of the antibiotics.  Objective: Vitals:   01/28/21 1622 01/28/21 2032 01/29/21 0513 01/29/21 0907  BP: (!)  149/86 (!) 159/95 (!) 154/83 119/76  Pulse: 76 73 62 66  Resp: '18 20 18 16  '$ Temp: 98.7 F (37.1 C) 98.7 F (37.1 C) 97.9 F (36.6 C) 98.5 F (36.9 C)  TempSrc:  Oral    SpO2: 96% 99% 97% 98%  Weight:      Height:        Intake/Output Summary (Last 24 hours) at 01/29/2021 1302 Last data filed at 01/29/2021 1100 Gross per 24 hour  Intake 574 ml  Output --  Net 574 ml   Filed Weights   01/26/21 2010 01/28/21 0800 01/28/21 1035  Weight: 106 kg 108 kg 106.5 kg    Examination:  General exam: Appears calm and comfortable  Respiratory system: Clear to auscultation. Respiratory effort normal. Cardiovascular system: S1 & S2 heard, RRR. No JVD, murmurs, rubs, gallops or clicks. No pedal edema. Gastrointestinal system: Abdomen is nondistended, soft and nontender. No organomegaly or masses felt. Normal bowel sounds heard. Central nervous system: Alert and oriented. No focal neurological deficits. Extremities: Symmetric 5 x 5 power. Skin: No rashes, lesions or ulcers.  Psychiatry: Judgement and insight appear normal.   Data Reviewed: I have independently reviewed following labs and imaging studies   CBC: Recent Labs  Lab 01/23/21 0426 01/24/21 0412 01/25/21 0300 01/26/21 1800 01/28/21 0817  WBC 5.6 5.8 6.2 7.6 5.6  HGB 7.9* 8.1* 8.1* 8.0* 7.9*  HCT 25.4* 26.6* 26.7* 24.7* 26.1*  MCV 96.2 97.8 97.1 95.0 96.7  PLT 244 236 217 242 0000000   Basic Metabolic Panel: Recent Labs  Lab 01/23/21 0426 01/24/21 0412 01/25/21 0300 01/26/21 1800 01/28/21 0817  NA 134* 134* 133* 133* 136  K 4.8 4.2 4.1 4.3 4.0  CL 92* 95* 93* 91* 95*  CO2 '25 26 25 23 26  '$ GLUCOSE 95 86 108* 86 112*  BUN 56* 36* 48* 61* 46*  CREATININE 13.43* 9.93* 12.01* 14.98* 13.22*  CALCIUM 9.8 9.4 9.6 10.1 10.1  PHOS  --   --   --  6.6* 5.8*   Liver Function Tests: Recent Labs  Lab 01/23/21 0426 01/26/21 1800 01/28/21 0817  AST 46*  --   --   ALT 35  --   --   ALKPHOS 42  --   --   BILITOT 0.7  --   --    PROT 6.0*  --   --   ALBUMIN 2.6* 2.7* 2.8*   Coagulation Profile: No results for input(s): INR, PROTIME in the last 168 hours. HbA1C: No results for input(s): HGBA1C in the last 72 hours. CBG: No results for input(s): GLUCAP in the last 168 hours.  Recent Results (from the past 240 hour(s))  MRSA PCR Screening     Status: None   Collection Time: 01/19/21  2:16 PM   Specimen: Nasal Mucosa; Nasopharyngeal  Result Value Ref Range Status   MRSA by PCR NEGATIVE NEGATIVE Final  Comment:        The GeneXpert MRSA Assay (FDA approved for NASAL specimens only), is one component of a comprehensive MRSA colonization surveillance program. It is not intended to diagnose MRSA infection nor to guide or monitor treatment for MRSA infections. Performed at [facility], [place] [address], [place], [place] [address]      Radiology Studies: No results found.  Darliss Cheney, MD [facility]  Between 7 am - 7 pm I am available, please contact me via Amion or [place]  Between 7 pm - 7 am I am not available, please contact night coverage MD/APP via Amion

## 2021-01-30 DIAGNOSIS — N611 Abscess of the breast and nipple: Secondary | ICD-10-CM | POA: Diagnosis not present

## 2021-01-30 LAB — CBC
HCT: 26.4 % — ABNORMAL LOW (ref 36.0–46.0)
Hemoglobin: 8.2 g/dL — ABNORMAL LOW (ref 12.0–15.0)
MCH: 30.3 pg (ref 26.0–34.0)
MCHC: 31.1 g/dL (ref 30.0–36.0)
MCV: 97.4 fL (ref 80.0–100.0)
Platelets: 253 10*3/uL (ref 150–400)
RBC: 2.71 MIL/uL — ABNORMAL LOW (ref 3.87–5.11)
RDW: 17.4 % — ABNORMAL HIGH (ref 11.5–15.5)
WBC: 6 10*3/uL (ref 4.0–10.5)
nRBC: 0 % (ref 0.0–0.2)

## 2021-01-30 LAB — RENAL FUNCTION PANEL
Albumin: 2.9 g/dL — ABNORMAL LOW (ref 3.5–5.0)
Anion gap: 17 — ABNORMAL HIGH (ref 5–15)
BUN: 42 mg/dL — ABNORMAL HIGH (ref 6–20)
CO2: 27 mmol/L (ref 22–32)
Calcium: 10.1 mg/dL (ref 8.9–10.3)
Chloride: 93 mmol/L — ABNORMAL LOW (ref 98–111)
Creatinine, Ser: 12.76 mg/dL — ABNORMAL HIGH (ref 0.44–1.00)
GFR, Estimated: 3 mL/min — ABNORMAL LOW (ref >60)
Glucose, Bld: 108 mg/dL — ABNORMAL HIGH (ref 70–99)
Phosphorus: 5.8 mg/dL — ABNORMAL HIGH (ref 2.5–4.6)
Potassium: 4.1 mmol/L (ref 3.5–5.1)
Sodium: 137 mmol/L (ref 135–145)

## 2021-01-30 MED ORDER — ACETAMINOPHEN 325 MG PO TABS
ORAL_TABLET | ORAL | Status: AC
  Filled 2021-01-30: qty 2

## 2021-01-30 NOTE — Plan of Care (Signed)
  Problem: Education: Goal: Knowledge of General Education information will improve Description: Including pain rating scale, medication(s)/side effects and non-pharmacologic comfort measures Outcome: Progressing   Problem: Pain Managment: Goal: General experience of comfort will improve Outcome: Progressing   Problem: Coping: Goal: Psychosocial and spiritual needs will be supported Outcome: Progressing

## 2021-01-30 NOTE — Progress Notes (Signed)
PROGRESS NOTE  Rebekah Burton [id number] DOB: [birth year] DOA: 01/15/2021 PCP: Trey Sailors, PA   LOS: 15 days   Brief Narrative / Interim history: 45 year old female with ESRD on HD MWF, blindness, hypertension, PAD came into the hospital with a breast abscess.  General surgery consulted, she is status post incision and drainage.  She was also incidental Covid, now off contact precautions.  She was initially on IV antibiotics which were then transition to Augmentin and she has completed course for that. 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  IR and vascular consulted.  aVF now working.  Dialysis per nephrology.  Does not have any place to live.  Needs an address in order to continue outpatient dialysis.  TOC working on finding placement.   Subjective / 24h Interval events: Seen and examined.  No new complaint.  Assessment & Plan: Principal Problem Breast abscess -General surgery consulted, she is status post I&D done by general surgery.  She was maintained on Unasyn, preliminary cultures show gram-positive, culture finalized with GPC's and mixed anaerobic flora.  She was transitioned to Augmentin and completed course.  Active Problems Incidental COVID-19-asymptomatic, no evidence of pulmonary involvement.  She had one positive and one negative test in the last few days, repeat the third test was negative, isolation discontinued on 1/25  ESRD-MWF schedule, nephrology consulted and following.  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  IR and vascular consulted.  aVF now working.  Dialysis per nephrology.  Anemia of chronic kidney disease-monitor, hemoglobin stable  Hypertension-continue home medications  Disposition-patient's relative, where she was living before did not want her to come back due to COVID-19.   She then decided to leave with her boyfriend in [place].  Process was a started to move her Medicaid/dialysis from [facility] to [facility] and process was anticipated to be completed on 01/26/2021 however right then, she decided to change her mind and she is not going to live with the boyfriend anymore and at the same time she does not have any other place at this point in time.  TOC is on board and working to find a placement for her.  Scheduled Meds: . carvedilol  25 mg Oral BID WC  . Chlorhexidine Gluconate Cloth  6 each Topical Q0600  . Chlorhexidine Gluconate Cloth  6 each Topical Q0600  . cloNIDine  0.3 mg Oral TID  . [START ON 02/01/2021] darbepoetin (ARANESP) injection - DIALYSIS  100 mcg Intravenous Q Mon-HD  . heparin  5,000 Units Subcutaneous Q8H  . lidocaine-prilocaine  1 application Topical Q M,W,F-HD  . multivitamin  1 tablet Oral QHS  . senna  1 tablet Oral BID  . sevelamer carbonate  3,200 mg Oral TID WC  . sodium chloride flush  3 mL Intravenous Q12H   Continuous Infusions: . sodium chloride     PRN Meds:.sodium chloride, acetaminophen **OR** [DISCONTINUED] acetaminophen, HYDROcodone-acetaminophen, sodium chloride flush  Diet Orders (From admission, onward)    Start     Ordered   01/25/21 1650  Diet renal with fluid restriction Fluid restriction: 1200 mL Fluid; Room service appropriate? Yes; Fluid consistency: Thin  Diet effective now       Question Answer Comment  Fluid restriction: 1200 mL Fluid   Room service appropriate? Yes   Fluid consistency: Thin      01/25/21 1649  DVT prophylaxis: heparin injection 5,000 Units Start: 01/26/21 0600     Code Status: Full Code  Family Communication: no family at bedside   Status is: Inpatient  Remains inpatient appropriate because: Unsafe discharge plan  Dispo: The patient is from: Home              Anticipated d/c is to: Home              Anticipated d/c date is: 1 day              Patient  currently is medically stable to d/c.   Difficult to place patient Yes  Level of care: Med-Surg  Consultants:  General surgery  Nephrology   Procedures:  I&D  Microbiology  GPC in abscess culture  Antimicrobials: Unasyn   Augmentin Completed course for both of the antibiotics.  Objective: Vitals:   01/29/21 1657 01/29/21 2017 01/30/21 0442 01/30/21 0934  BP: (!) 143/76 (!) 140/94 139/67 138/82  Pulse: 65 69 (!) 58 66  Resp: '14 18 18 18  '$ Temp: 97.7 F (36.5 C) 98.2 F (36.8 C) 98.2 F (36.8 C) 98.3 F (36.8 C)  TempSrc:  Oral Oral   SpO2: 100% 100% 98% 100%  Weight:      Height:        Intake/Output Summary (Last 24 hours) at 01/30/2021 1107 Last data filed at 01/30/2021 0900 Gross per 24 hour  Intake 480 ml  Output 0 ml  Net 480 ml   Filed Weights   01/26/21 2010 01/28/21 0800 01/28/21 1035  Weight: 106 kg 108 kg 106.5 kg    Examination:  General exam: Appears calm and comfortable  Respiratory system: Clear to auscultation. Respiratory effort normal. Cardiovascular system: S1 & S2 heard, RRR. No JVD, murmurs, rubs, gallops or clicks. No pedal edema. Gastrointestinal system: Abdomen is nondistended, soft and nontender. No organomegaly or masses felt. Normal bowel sounds heard. Central nervous system: Alert and oriented. No focal neurological deficits. Extremities: Symmetric 5 x 5 power. Skin: No rashes, lesions or ulcers.  Psychiatry: Judgement and insight appear normal.   Data Reviewed: I have independently reviewed following labs and imaging studies   CBC: Recent Labs  Lab 01/24/21 0412 01/25/21 0300 01/26/21 1800 01/28/21 0817  WBC 5.8 6.2 7.6 5.6  HGB 8.1* 8.1* 8.0* 7.9*  HCT 26.6* 26.7* 24.7* 26.1*  MCV 97.8 97.1 95.0 96.7  PLT 236 217 242 0000000   Basic Metabolic Panel: Recent Labs  Lab 01/24/21 0412 01/25/21 0300 01/26/21 1800 01/28/21 0817  NA 134* 133* 133* 136  K 4.2 4.1 4.3 4.0  CL 95* 93* 91* 95*  CO2 '26 25 23 26  '$ GLUCOSE 86  108* 86 112*  BUN 36* 48* 61* 46*  CREATININE 9.93* 12.01* 14.98* 13.22*  CALCIUM 9.4 9.6 10.1 10.1  PHOS  --   --  6.6* 5.8*   Liver Function Tests: Recent Labs  Lab 01/26/21 1800 01/28/21 0817  ALBUMIN 2.7* 2.8*   Coagulation Profile: No results for input(s): INR, PROTIME in the last 168 hours. HbA1C: No results for input(s): HGBA1C in the last 72 hours. CBG: No results for input(s): GLUCAP in the last 168 hours.  No results found for this or any previous visit (from the past 240 hour(s)).   Radiology Studies: No results found.  Darliss Cheney, MD [facility]  Between 7 am - 7 pm I am available, please contact me via Amion or [place]  Between 7 pm - 7 am I am  not available, please contact night coverage MD/APP via Amion

## 2021-01-30 NOTE — Progress Notes (Addendum)
Rebekah Burton Progress Note   Subjective:   Patient seen and examined at bedside while eating breakfast.  Feeling well.  No specific complaints.  Denies CP, SOB, n/v/d, abdominal pain and fatigue.   Objective Vitals:   01/29/21 1657 01/29/21 2017 01/30/21 0442 01/30/21 0934  BP: (!) 143/76 (!) 140/94 139/67 138/82  Pulse: 65 69 (!) 58 66  Resp: '14 18 18 18  '$ Temp: 97.7 F (36.5 C) 98.2 F (36.8 C) 98.2 F (36.8 C) 98.3 F (36.8 C)  TempSrc:  Oral Oral   SpO2: 100% 100% 98% 100%  Weight:      Height:       Physical Exam General:chronically ill appearing female in NAD Heart:RRR Lungs:CTAB, nml WOB Abdomen:soft, NTND Extremities:no LE edema, b/l TMA Dialysis Access: LU AVF +b/t   Filed Weights   01/26/21 2010 01/28/21 0800 01/28/21 1035  Weight: 106 kg 108 kg 106.5 kg    Intake/Output Summary (Last 24 hours) at 01/30/2021 0950 Last data filed at 01/30/2021 0900 Gross per 24 hour  Intake 717 ml  Output 0 ml  Net 717 ml    Additional Objective Labs: Basic Metabolic Panel: Recent Labs  Lab 01/25/21 0300 01/26/21 1800 01/28/21 0817  NA 133* 133* 136  K 4.1 4.3 4.0  CL 93* 91* 95*  CO2 '25 23 26  '$ GLUCOSE 108* 86 112*  BUN 48* 61* 46*  CREATININE 12.01* 14.98* 13.22*  CALCIUM 9.6 10.1 10.1  PHOS  --  6.6* 5.8*   Liver Function Tests: Recent Labs  Lab 01/26/21 1800 01/28/21 0817  ALBUMIN 2.7* 2.8*   CBC: Recent Labs  Lab 01/24/21 0412 01/25/21 0300 01/26/21 1800 01/28/21 0817  WBC 5.8 6.2 7.6 5.6  HGB 8.1* 8.1* 8.0* 7.9*  HCT 26.6* 26.7* 24.7* 26.1*  MCV 97.8 97.1 95.0 96.7  PLT 236 217 242 246    Medications: . sodium chloride     . carvedilol  25 mg Oral BID WC  . Chlorhexidine Gluconate Cloth  6 each Topical Q0600  . Chlorhexidine Gluconate Cloth  6 each Topical Q0600  . cloNIDine  0.3 mg Oral TID  . [START ON 02/01/2021] darbepoetin (ARANESP) injection - DIALYSIS  100 mcg Intravenous Q Mon-HD  . heparin  5,000 Units  Subcutaneous Q8H  . lidocaine-prilocaine  1 application Topical Q M,W,F-HD  . multivitamin  1 tablet Oral QHS  . senna  1 tablet Oral BID  . sevelamer carbonate  3,200 mg Oral TID WC  . sodium chloride flush  3 mL Intravenous Q12H    Dialysis Orders: MWF  4h 47mn 2/2.5 bath 105kg AVF Heparin ? parsabiv 15 mg three times a week with HD mircera 150 mcg every 2 weeks - last given 01/13/21 hectorol 3 mcg three times a week  Assessment/Plan: 1. End-stage renal disease -Typically MWF, off schedule this week.  Plan for HD today and resume regular schedule on Monday.  2. Vascular access- difficulty with cannulation of arterial portion of AVF. Has history of issues with access.IR consulted 01/25/21 which revealed significant pseudoaneurysm s/p thrombin injection and near complete thrombosis of pseudoaneurysm per IR.Used AVF last HD with some pain but otherwise no issues. 3. Left breast abscess- s/p I&D, abx per primary svc 4. covid-19 infection- off of isolation. 5. HTN-much better control here than outpatient and pt agrees she feels better with normal BP.Close to her outpatient EDW, may need to be raised a bit.Does not appear volume overloaded. UF as tolerated.  6. Anemia CKD -  last Hgb 7.9.ContinueAranesp 100 mcg IV. Last dose was Saturday, will adjust schedule to q Monday.transfuse prn 7. Metabolic bone disease-Phos at goal. Corrected calcium high, hectorol on hold, use low Ca bath. Not able to get parsabiv here  8. Social Issues: Safe d/c plan pending, now considering ALF   Rebekah Mow, PA-C [facility] 01/30/2021,9:50 AM  LOS: 15 days   I have seen and examined this patient and agree with plan and assessment in the above note with renal recommendations/intervention highlighted.  Rebekah John A Paiden Caraveo,MD 01/30/2021 11:26 AM

## 2021-01-31 DIAGNOSIS — N611 Abscess of the breast and nipple: Secondary | ICD-10-CM | POA: Diagnosis not present

## 2021-01-31 MED ORDER — CHLORHEXIDINE GLUCONATE CLOTH 2 % EX PADS
6.0000 | MEDICATED_PAD | Freq: Every day | CUTANEOUS | Status: DC
  Administered 2021-02-06: 6 via TOPICAL

## 2021-01-31 NOTE — Progress Notes (Addendum)
[facility] Progress Note   Subjective:   Patient seen and examined in room.  Up out of bed at the sink washing up.  Reports issues with cannulation yesterday but ran well once able to be cannulated.  Denies CP, SOB, abdominal pain and n/v/d.    Objective Vitals:   01/30/21 1558 01/30/21 2059 01/31/21 0531 01/31/21 0921  BP: 138/83 124/73 (!) 161/88 (!) 123/52  Pulse: 70 72 69 64  Resp: '18 16 16 18  '$ Temp: 97.8 F (36.6 C) 98.2 F (36.8 C) 97.9 F (36.6 C) 98.6 F (37 C)  TempSrc:   Oral Oral  SpO2: 100% 99% 100% 97%  Weight:      Height:       Physical Exam General:WDWN female in NAD Heart:RRR Lungs:CTAB, nml WOB Abdomen:soft, NTND Extremities:no LE edema, b/l TMA Dialysis Access: LU AVF +b/t   Filed Weights   01/26/21 2010 01/28/21 0800 01/28/21 1035  Weight: 106 kg 108 kg 106.5 kg    Intake/Output Summary (Last 24 hours) at 01/31/2021 1209 Last data filed at 01/31/2021 0600 Gross per 24 hour  Intake 510 ml  Output 0 ml  Net 510 ml    Additional Objective Labs: Basic Metabolic Panel: Recent Labs  Lab 01/26/21 1800 01/28/21 0817 01/30/21 1249  NA 133* 136 137  K 4.3 4.0 4.1  CL 91* 95* 93*  CO2 '23 26 27  '$ GLUCOSE 86 112* 108*  BUN 61* 46* 42*  CREATININE 14.98* 13.22* 12.76*  CALCIUM 10.1 10.1 10.1  PHOS 6.6* 5.8* 5.8*   Liver Function Tests: Recent Labs  Lab 01/26/21 1800 01/28/21 0817 01/30/21 1249  ALBUMIN 2.7* 2.8* 2.9*   CBC: Recent Labs  Lab 01/25/21 0300 01/26/21 1800 01/28/21 0817 01/30/21 1249  WBC 6.2 7.6 5.6 6.0  HGB 8.1* 8.0* 7.9* 8.2*  HCT 26.7* 24.7* 26.1* 26.4*  MCV 97.1 95.0 96.7 97.4  PLT 217 242 246 253    Medications: . sodium chloride     . carvedilol  25 mg Oral BID WC  . Chlorhexidine Gluconate Cloth  6 each Topical Q0600  . Chlorhexidine Gluconate Cloth  6 each Topical Q0600  . cloNIDine  0.3 mg Oral TID  . [START ON 02/01/2021] darbepoetin (ARANESP) injection - DIALYSIS  100 mcg Intravenous Q  Mon-HD  . heparin  5,000 Units Subcutaneous Q8H  . lidocaine-prilocaine  1 application Topical Q M,W,F-HD  . multivitamin  1 tablet Oral QHS  . senna  1 tablet Oral BID  . sevelamer carbonate  3,200 mg Oral TID WC  . sodium chloride flush  3 mL Intravenous Q12H    Dialysis Orders: MWF  4h 52mn 2/2.5 bath 105kg AVF Heparin ? parsabiv 15 mg three times a week with HD mircera 150 mcg every 2 weeks - last given 01/13/21 hectorol 3 mcg three times a week  Assessment/Plan: 1. End-stage renal disease -Typically MWF, off schedule last week. Next HD tomorrow.  2. Vascular access- difficulty with cannulation of arterial portion of AVF. Has history of issues with access.IR consulted 01/25/21 which revealed significant pseudoaneurysm s/p thrombin injection and near complete thrombosis of pseudoaneurysm per IR.Last HD some issues with cannulation but ran well once accessed.   3. Left breast abscess- s/p I&D, abx per primary svc 4. covid-19 infection- off of isolation. 5. HTN-much better control here than outpatient and pt agrees she feels better with normal BP.Close to her outpatient EDW, may need to be raised a bit.Does not appear volume overloaded. UF as  tolerated. 6. Anemia CKD - last Hgb^8.2.ContinueAranesp 100 mcg IV q Monday.transfuse prn 7. Metabolic bone disease-Phos mildly goal. Corrected calcium high, hectorol on hold, use low Ca bath. Not able to get parsabiv here. Continue binders.  8. Social Issues: Safe d/c plan pending, now considering ALF   Jen Mow, PA-C [place] 01/31/2021,12:09 PM  LOS: 16 days   I have seen and examined this patient and agree with plan and assessment in the above note with renal recommendations/intervention highlighted.  Governor Rooks Kathyleen Radice,MD 01/31/2021 12:54 PM

## 2021-01-31 NOTE — Progress Notes (Signed)
PROGRESS NOTE  Rebekah Burton [id number] DOB: [birth year] DOA: 01/15/2021 PCP: Trey Sailors, PA   LOS: 16 days   Brief Narrative / Interim history: 45 year old female with ESRD on HD MWF, blindness, hypertension, PAD came into the hospital with a breast abscess.  General surgery consulted, she is status post incision and drainage.  She was also incidental Covid, now off contact precautions.  She was initially on IV antibiotics which were then transition to Augmentin and she has completed course for that. 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  IR and vascular consulted.  aVF now working.  Dialysis per nephrology.  Does not have any place to live.  Needs an address in order to continue outpatient dialysis.  TOC working on finding placement.  Subjective / 24h Interval events: Seen and examined.  Sleepy.  Complains of mild left arm pain and left breast pain which is usual for her.  No new complaint.  Assessment & Plan: Principal Problem Breast abscess -General surgery consulted, she is status post I&D done by general surgery.  She was maintained on Unasyn, preliminary cultures show gram-positive, culture finalized with GPC's and mixed anaerobic flora.  She was transitioned to Augmentin and completed course.  Active Problems Incidental COVID-19-asymptomatic, no evidence of pulmonary involvement.  She had one positive and one negative test in the last few days, repeat the third test was negative, isolation discontinued on 1/25  ESRD-MWF schedule, nephrology consulted and following.  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  IR and vascular consulted.  aVF now working.  Dialysis per nephrology.  Anemia of chronic kidney disease-monitor, hemoglobin stable  Hypertension-continue home medications  Disposition-patient's  relative, where she was living before did not want her to come back due to COVID-19.  She then decided to leave with her boyfriend in [place].  Process was a started to move her Medicaid/dialysis from [facility] to [facility] and process was anticipated to be completed on 01/26/2021 however right then, she decided to change her mind and she is not going to live with the boyfriend anymore and at the same time she does not have any other place at this point in time.  TOC is on board and working to find a placement for her.  Scheduled Meds: . carvedilol  25 mg Oral BID WC  . Chlorhexidine Gluconate Cloth  6 each Topical Q0600  . Chlorhexidine Gluconate Cloth  6 each Topical Q0600  . cloNIDine  0.3 mg Oral TID  . [START ON 02/01/2021] darbepoetin (ARANESP) injection - DIALYSIS  100 mcg Intravenous Q Mon-HD  . heparin  5,000 Units Subcutaneous Q8H  . lidocaine-prilocaine  1 application Topical Q M,W,F-HD  . multivitamin  1 tablet Oral QHS  . senna  1 tablet Oral BID  . sevelamer carbonate  3,200 mg Oral TID WC  . sodium chloride flush  3 mL Intravenous Q12H   Continuous Infusions: . sodium chloride     PRN Meds:.sodium chloride, acetaminophen **OR** [DISCONTINUED] acetaminophen, HYDROcodone-acetaminophen, sodium chloride flush  Diet Orders (From admission, onward)    Start     Ordered   01/25/21 1650  Diet renal with fluid restriction Fluid restriction: 1200 mL Fluid; Room service appropriate? Yes; Fluid consistency: Thin  Diet effective now       Question Answer Comment  Fluid restriction: 1200 mL Fluid   Room service  appropriate? Yes   Fluid consistency: Thin      01/25/21 1649          DVT prophylaxis: heparin injection 5,000 Units Start: 01/26/21 0600     Code Status: Full Code  Family Communication: no family at bedside   Status is: Inpatient  Remains inpatient appropriate because: Unsafe discharge plan  Dispo: The patient is from: Home               Anticipated d/c is to: Home              Anticipated d/c date is: 1 day              Patient currently is medically stable to d/c.   Difficult to place patient Yes  Level of care: Med-Surg  Consultants:  General surgery  Nephrology   Procedures:  I&D  Microbiology  GPC in abscess culture  Antimicrobials: Unasyn   Augmentin Completed course for both of the antibiotics.  Objective: Vitals:   01/30/21 1558 01/30/21 2059 01/31/21 0531 01/31/21 0921  BP: 138/83 124/73 (!) 161/88 (!) 123/52  Pulse: 70 72 69 64  Resp: '18 16 16 18  '$ Temp: 97.8 F (36.6 C) 98.2 F (36.8 C) 97.9 F (36.6 C) 98.6 F (37 C)  TempSrc:   Oral Oral  SpO2: 100% 99% 100% 97%  Weight:      Height:        Intake/Output Summary (Last 24 hours) at 01/31/2021 1037 Last data filed at 01/31/2021 0600 Gross per 24 hour  Intake 510 ml  Output 0 ml  Net 510 ml   Filed Weights   01/26/21 2010 01/28/21 0800 01/28/21 1035  Weight: 106 kg 108 kg 106.5 kg    Examination:  General exam: Appears calm and comfortable  Respiratory system: Clear to auscultation. Respiratory effort normal. Cardiovascular system: S1 & S2 heard, RRR. No JVD, murmurs, rubs, gallops or clicks. No pedal edema. Gastrointestinal system: Abdomen is nondistended, soft and nontender. No organomegaly or masses felt. Normal bowel sounds heard. Central nervous system: Alert and oriented. No focal neurological deficits. Extremities: Symmetric 5 x 5 power. Skin: No rashes, lesions or ulcers.  Psychiatry: Judgement and insight appear normal. Mood & affect appropriate.   Data Reviewed: I have independently reviewed following labs and imaging studies   CBC: Recent Labs  Lab 01/25/21 0300 01/26/21 1800 01/28/21 0817 01/30/21 1249  WBC 6.2 7.6 5.6 6.0  HGB 8.1* 8.0* 7.9* 8.2*  HCT 26.7* 24.7* 26.1* 26.4*  MCV 97.1 95.0 96.7 97.4  PLT 217 242 246 [id number]   Basic Metabolic Panel: Recent Labs  Lab 01/25/21 0300 01/26/21 1800  01/28/21 0817 01/30/21 1249  NA 133* 133* 136 137  K 4.1 4.3 4.0 4.1  CL 93* 91* 95* 93*  CO2 '25 23 26 27  '$ GLUCOSE 108* 86 112* 108*  BUN 48* 61* 46* 42*  CREATININE 12.01* 14.98* 13.22* 12.76*  CALCIUM 9.6 10.1 10.1 10.1  PHOS  --  6.6* 5.8* 5.8*   Liver Function Tests: Recent Labs  Lab 01/26/21 1800 01/28/21 0817 01/30/21 1249  ALBUMIN 2.7* 2.8* 2.9*   Coagulation Profile: No results for input(s): INR, PROTIME in the last 168 hours. HbA1C: No results for input(s): HGBA1C in the last 72 hours. CBG: No results for input(s): GLUCAP in the last 168 hours.  No results found for this or any previous visit (from the past 240 hour(s)).   Radiology Studies: No results found.  Darliss Cheney, MD [facility]  Between 7 am - 7 pm I am available, please contact me via Amion or Securechat  Between 7 pm - 7 am I am not available, please contact night coverage MD/APP via Amion

## 2021-01-31 NOTE — Plan of Care (Signed)
  Problem: Education: Goal: Knowledge of General Education information will improve Description Including pain rating scale, medication(s)/side effects and non-pharmacologic comfort measures Outcome: Progressing   

## 2021-02-01 DIAGNOSIS — N611 Abscess of the breast and nipple: Secondary | ICD-10-CM | POA: Diagnosis not present

## 2021-02-01 LAB — RENAL FUNCTION PANEL
Albumin: 2.7 g/dL — ABNORMAL LOW (ref 3.5–5.0)
Anion gap: 13 (ref 5–15)
BUN: 35 mg/dL — ABNORMAL HIGH (ref 6–20)
CO2: 25 mmol/L (ref 22–32)
Calcium: 9.8 mg/dL (ref 8.9–10.3)
Chloride: 94 mmol/L — ABNORMAL LOW (ref 98–111)
Creatinine, Ser: 11.29 mg/dL — ABNORMAL HIGH (ref 0.44–1.00)
GFR, Estimated: 4 mL/min — ABNORMAL LOW (ref >60)
Glucose, Bld: 99 mg/dL (ref 70–99)
Phosphorus: 5.8 mg/dL — ABNORMAL HIGH (ref 2.5–4.6)
Potassium: 3.8 mmol/L (ref 3.5–5.1)
Sodium: 132 mmol/L — ABNORMAL LOW (ref 135–145)

## 2021-02-01 LAB — CBC
HCT: 26 % — ABNORMAL LOW (ref 36.0–46.0)
Hemoglobin: 8 g/dL — ABNORMAL LOW (ref 12.0–15.0)
MCH: 30.1 pg (ref 26.0–34.0)
MCHC: 30.8 g/dL (ref 30.0–36.0)
MCV: 97.7 fL (ref 80.0–100.0)
Platelets: 218 10*3/uL (ref 150–400)
RBC: 2.66 MIL/uL — ABNORMAL LOW (ref 3.87–5.11)
RDW: 17.6 % — ABNORMAL HIGH (ref 11.5–15.5)
WBC: 5.7 10*3/uL (ref 4.0–10.5)
nRBC: 0 % (ref 0.0–0.2)

## 2021-02-01 MED ORDER — LIDOCAINE HCL (PF) 1 % IJ SOLN
5.0000 mL | INTRAMUSCULAR | Status: DC | PRN
Start: 2021-02-01 — End: 2021-02-01

## 2021-02-01 MED ORDER — ACETAMINOPHEN 325 MG PO TABS
ORAL_TABLET | ORAL | Status: AC
  Filled 2021-02-01: qty 2

## 2021-02-01 MED ORDER — ACETAMINOPHEN 325 MG PO TABS
ORAL_TABLET | ORAL | Status: AC
  Filled 2021-02-01: qty 1

## 2021-02-01 MED ORDER — LIDOCAINE-PRILOCAINE 2.5-2.5 % EX CREA: 1.0000 "application " | TOPICAL_CREAM | CUTANEOUS | Status: DC | PRN

## 2021-02-01 MED ORDER — PENTAFLUOROPROP-TETRAFLUOROETH EX AERO: 1.0000 "application " | INHALATION_SPRAY | CUTANEOUS | Status: DC | PRN

## 2021-02-01 MED ORDER — HEPARIN SODIUM (PORCINE) 1000 UNIT/ML DIALYSIS: 1000.0000 [IU] | INTRAMUSCULAR | Status: DC | PRN

## 2021-02-01 MED ORDER — HEPARIN SODIUM (PORCINE) 1000 UNIT/ML DIALYSIS
1000.0000 [IU] | INTRAMUSCULAR | Status: DC | PRN
Start: 2021-02-01 — End: 2021-02-01

## 2021-02-01 MED ORDER — ALTEPLASE 2 MG IJ SOLR: 2.0000 mg | Freq: Once | INTRAMUSCULAR | Status: DC | PRN

## 2021-02-01 MED ORDER — LIDOCAINE HCL (PF) 1 % IJ SOLN: 5.0000 mL | INTRAMUSCULAR | Status: DC | PRN

## 2021-02-01 MED ORDER — SODIUM CHLORIDE 0.9 % IV SOLN: 100.0000 mL | INTRAVENOUS | Status: DC | PRN

## 2021-02-01 MED ORDER — DARBEPOETIN ALFA 100 MCG/0.5ML IJ SOSY
PREFILLED_SYRINGE | INTRAMUSCULAR | Status: AC
  Filled 2021-02-01: qty 0.5

## 2021-02-01 MED ORDER — SODIUM CHLORIDE 0.9 % IV SOLN
100.0000 mL | INTRAVENOUS | Status: DC | PRN
Start: 2021-02-01 — End: 2021-02-01

## 2021-02-01 NOTE — Progress Notes (Signed)
PT Cancellation Note  Patient Details Name: Rebekah Burton MRN: [id number] DOB: Jul 04, 1976   Cancelled Treatment:    Reason Eval/Treat Not Completed: Patient declined, no reason specified very tired after HD, requests we return another day. Will continue to follow.    Windell Norfolk, DPT, PN1   Supplemental Physical Therapist [facility]    Pager [phone] Acute Rehab Office [phone]

## 2021-02-01 NOTE — Care Management Important Message (Signed)
Important Message  Patient Details  Name: Lasean Salcido MRN: [id number] Date of Birth: 08-22-1976   Medicare Important Message Given:  Yes - Important Message mailed due to current National Emergency  Verbal consent obtained due to current National Emergency  Relationship to patient: Self Contact Name: Kenitha Brummett Call Date: 02/01/21  Time: 1150 Phone: [phone] Outcome: Spoke with contact Important Message mailed to: Patient address on file    Delorse Lek 02/01/2021, 11:50 AM

## 2021-02-01 NOTE — TOC Progression Note (Addendum)
Transition of Care [facility]) - Progression Note    Patient Details  Name: Rebekah Burton MRN: [id number] Date of Birth: [birth year]  Transition [facility]) CM/SW Contact  Bartholomew Crews, RN Phone Number: [phone] 02/01/2021, 4:01 PM  Clinical Narrative:     Call to [place] [phone]. Voicemail left for Earlean Polka to discuss possible options for transition to ALF vs adult care home. Contact information provided.   Call to [facility]. Spoke with Hydia in admissions. Referral sent for review to fax [phone].   Expected Discharge Plan: [place] Services Barriers to Discharge: Other (comment) (working on dialysis and Medicaid transfers to [employer])  Expected Discharge Plan and Services Expected Discharge Plan: [place]   Discharge Planning Services: CM Consult Post Acute Care Choice: [place] arrangements for the past 2 months: Single Family Home                 DME Arranged: N/A DME Agency: NA       HH Arranged: RN [place] Agency: [facility] Date [place]: 01/21/21 Time [place]: [id number] Representative spoke with at [place]: [place] ([place]) Interventions    Readmission Risk Interventions No flowsheet data found.

## 2021-02-01 NOTE — Plan of Care (Signed)
  Problem: Education: Goal: Knowledge of General Education information will improve Description: Including pain rating scale, medication(s)/side effects and non-pharmacologic comfort measures Outcome: Progressing   Problem: Pain Managment: Goal: General experience of comfort will improve Outcome: Progressing   

## 2021-02-01 NOTE — Progress Notes (Signed)
PROGRESS NOTE  Rebekah Burton [id number] DOB: 05-Nov-1976 DOA: 01/15/2021 PCP: Trey Sailors, PA   LOS: 17 days   Brief Narrative / Interim history: 45 year old female with ESRD on HD MWF, blindness, hypertension, PAD came into the hospital with a breast abscess.  General surgery consulted, she is status post incision and drainage.  She was also incidental Covid, now off contact precautions.  She was initially on IV antibiotics which were then transition to Augmentin and she has completed course for that. 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  IR and vascular consulted.  aVF now working.  Dialysis per nephrology.  Does not have any place to live.  Needs an address in order to continue outpatient dialysis.  TOC working on finding placement.  Subjective / 24h Interval events: Seen and examined in dialysis unit. She has no complaints.  Assessment & Plan: Principal Problem Breast abscess -General surgery consulted, she is status post I&D done by general surgery.  She was maintained on Unasyn, preliminary cultures show gram-positive, culture finalized with GPC's and mixed anaerobic flora.  She was transitioned to Augmentin and completed course.  Active Problems Incidental COVID-19-asymptomatic, no evidence of pulmonary involvement.  She had one positive and one negative test in the last few days, repeat the third test was negative, isolation discontinued on 1/25  ESRD-MWF schedule, nephrology consulted and following.  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  IR and vascular consulted.  aVF now working.  Dialysis per nephrology.  Anemia of chronic kidney disease-monitor, hemoglobin stable  Hypertension-continue home medications  Disposition-patient's relative, where she was living before did not want her to come back  due to COVID-19.  She then decided to leave with her boyfriend in [place].  Process was a started to move her Medicaid/dialysis from [facility] to [facility] and process was anticipated to be completed on 01/26/2021 however right then, she decided to change her mind and she is not going to live with the boyfriend anymore and at the same time she does not have any other place at this point in time.  TOC is on board and working to find a placement for her.  Scheduled Meds: . acetaminophen      . acetaminophen      . carvedilol  25 mg Oral BID WC  . Chlorhexidine Gluconate Cloth  6 each Topical Q0600  . Chlorhexidine Gluconate Cloth  6 each Topical Q0600  . Chlorhexidine Gluconate Cloth  6 each Topical Q0600  . cloNIDine  0.3 mg Oral TID  . Darbepoetin Alfa      . darbepoetin (ARANESP) injection - DIALYSIS  100 mcg Intravenous Q Mon-HD  . heparin  5,000 Units Subcutaneous Q8H  . lidocaine-prilocaine  1 application Topical Q M,W,F-HD  . multivitamin  1 tablet Oral QHS  . senna  1 tablet Oral BID  . sevelamer carbonate  3,200 mg Oral TID WC  . sodium chloride flush  3 mL Intravenous Q12H   Continuous Infusions: . sodium chloride     PRN Meds:.sodium chloride, acetaminophen **OR** [DISCONTINUED] acetaminophen, HYDROcodone-acetaminophen, sodium chloride flush  Diet Orders (From admission, onward)    Start     Ordered   01/25/21 1650  Diet renal with fluid restriction Fluid restriction: 1200 mL Fluid; Room service appropriate? Yes; Fluid consistency: Thin  Diet effective now  Question Answer Comment  Fluid restriction: 1200 mL Fluid   Room service appropriate? Yes   Fluid consistency: Thin      01/25/21 1649          DVT prophylaxis: heparin injection 5,000 Units Start: 01/26/21 0600     Code Status: Full Code  Family Communication: no family at bedside   Status is: Inpatient  Remains inpatient appropriate because: Unsafe discharge plan. Patient has no home to  go to. She needs an address in order to continue dialysis. TOC is working on finding some sort of help so she can be discharged to a motel.  Dispo: The patient is from: Home              Anticipated d/c is to: Home              Anticipated d/c date is: 1 day              Patient currently is medically stable to d/c.   Difficult to place patient Yes  Level of care: Med-Surg  Consultants:  General surgery  Nephrology   Procedures:  I&D  Microbiology  GPC in abscess culture  Antimicrobials: Unasyn   Augmentin Completed course for both of the antibiotics.  Objective: Vitals:   02/01/21 1000 02/01/21 1019 02/01/21 1024 02/01/21 1058  BP: (!) 144/72 (!) 150/69 139/73 (!) 146/95  Pulse: 69 73 70 72  Resp:  '18 18 16  '$ Temp:  98.8 F (37.1 C) 98.8 F (37.1 C) 98.8 F (37.1 C)  TempSrc:  Oral Oral Oral  SpO2:  100%  100%  Weight:  102.8 kg    Height:        Intake/Output Summary (Last 24 hours) at 02/01/2021 1110 Last data filed at 02/01/2021 1019 Gross per 24 hour  Intake 600 ml  Output 2500 ml  Net -1900 ml   Filed Weights   01/28/21 1035 02/01/21 0715 02/01/21 1019  Weight: 106.5 kg 105.3 kg 102.8 kg    Examination:  General exam: Appears calm and comfortable  Respiratory system: Clear to auscultation. Respiratory effort normal. Cardiovascular system: S1 & S2 heard, RRR. No JVD, murmurs, rubs, gallops or clicks. No pedal edema. Gastrointestinal system: Abdomen is nondistended, soft and nontender. No organomegaly or masses felt. Normal bowel sounds heard. Central nervous system: Alert and oriented. No focal neurological deficits. Extremities: Symmetric 5 x 5 power. Skin: No rashes, lesions or ulcers.  Psychiatry: Judgement and insight appear normal. Mood & affect appropriate.    Data Reviewed: I have independently reviewed following labs and imaging studies   CBC: Recent Labs  Lab 01/26/21 1800 01/28/21 0817 01/30/21 1249 02/01/21 0637  WBC 7.6 5.6 6.0 5.7   HGB 8.0* 7.9* 8.2* 8.0*  HCT 24.7* 26.1* 26.4* 26.0*  MCV 95.0 96.7 97.4 97.7  PLT 242 246 253 [id number]   Basic Metabolic Panel: Recent Labs  Lab 01/26/21 1800 01/28/21 0817 01/30/21 1249 02/01/21 0636  NA 133* 136 137 132*  K 4.3 4.0 4.1 3.8  CL 91* 95* 93* 94*  CO2 '23 26 27 25  '$ GLUCOSE 86 112* 108* 99  BUN 61* 46* 42* 35*  CREATININE 14.98* 13.22* 12.76* 11.29*  CALCIUM 10.1 10.1 10.1 9.8  PHOS 6.6* 5.8* 5.8* 5.8*   Liver Function Tests: Recent Labs  Lab 01/26/21 1800 01/28/21 0817 01/30/21 1249 02/01/21 0636  ALBUMIN 2.7* 2.8* 2.9* 2.7*   Coagulation Profile: No results for input(s): INR, PROTIME in the last 168 hours. HbA1C: No  results for input(s): HGBA1C in the last 72 hours. CBG: No results for input(s): GLUCAP in the last 168 hours.  No results found for this or any previous visit (from the past 240 hour(s)).   Radiology Studies: No results found.  Darliss Cheney, MD [facility]  Between 7 am - 7 pm I am available, please contact me via Amion or [place]  Between 7 pm - 7 am I am not available, please contact night coverage MD/APP via Amion

## 2021-02-01 NOTE — Progress Notes (Signed)
Subjective: Seen on dialysis no complaints, left upper arm AV fistula used today  Objective Vital signs in last 24 hours: Vitals:   02/01/21 0722 02/01/21 0730 02/01/21 0800 02/01/21 0830  BP: (!) 166/92 (!) 155/86 (!) 172/66 (!) 152/71  Pulse: 60 66 63 64  Resp:      Temp:      TempSrc:      SpO2:      Weight:      Height:       Weight change:   Physical Exam General:WDWN female in NAD Heart:RRR, no RMG Lungs:CTAB, nml WOB Abdomen: Obese, soft, NTND Extremities:no LE edema, b/l TMA Dialysis Access: LU AVF patent on HD 400 blood flow rate  Dialysis Orders: MWF [facility] 4h 88mn 2/2.5 bath 105kg AVF Heparin ? parsabiv 15 mg three times a week with HD mircera 150 mcg every 2 weeks - last given 01/13/21 hectorol 3 mcg three times a week  Problem/Plan: 1. End-stage renal disease -Typically MWF, off schedule last week.  Now on schedule .  2. HD vascular access- history of cannulation issues AVF.IR consulted 01/25/21 which revealed significant pseudoaneurysm s/p thrombin injection and near complete thrombosis of pseudoaneurysm per IR.Last HD some issues with cannulation but ran well once accessed.   3. Left breast abscess- s/p I&D, abx per primary svc 4. covid-19 infection- off of isolation. 5. HTN-BP  better control in HOSP than outpatient pre-HD at EDW follow-up post weight to lower EDW.Does not appear volume overloaded. UF as tolerated. 6. Anemia CKD -Hgb 8.0.ContinueAranesp 100 mcg IV q Monday.^ 150 next treatment as, check iron study next treatment,transfuse prn 7. Metabolic bone disease-Phos  5.8. Corrected calcium high, hectorol on hold, use low Ca bath. Not able to get parsabiv here. Continue binders.  8. Social Issues: Safe d/c plan pending, now considering ALF   DErnest Haber PA-C [facility] [phone]/06/2021,8:51 AM  LOS: 17 days   Labs: Basic Metabolic Panel: Recent Labs  Lab 01/28/21 0817 01/30/21 1249  02/01/21 0636  NA 136 137 132*  K 4.0 4.1 3.8  CL 95* 93* 94*  CO2 '26 27 25  '$ GLUCOSE 112* 108* 99  BUN 46* 42* 35*  CREATININE 13.22* 12.76* 11.29*  CALCIUM 10.1 10.1 9.8  PHOS 5.8* 5.8* 5.8*   Liver Function Tests: Recent Labs  Lab 01/28/21 0817 01/30/21 1249 02/01/21 0636  ALBUMIN 2.8* 2.9* 2.7*   No results for input(s): LIPASE, AMYLASE in the last 168 hours. No results for input(s): AMMONIA in the last 168 hours. CBC: Recent Labs  Lab 01/26/21 1800 01/28/21 0817 01/30/21 1249 02/01/21 0637  WBC 7.6 5.6 6.0 5.7  HGB 8.0* 7.9* 8.2* 8.0*  HCT 24.7* 26.1* 26.4* 26.0*  MCV 95.0 96.7 97.4 97.7  PLT 242 246 253 218   Cardiac Enzymes: No results for input(s): CKTOTAL, CKMB, CKMBINDEX, TROPONINI in the last 168 hours. CBG: No results for input(s): GLUCAP in the last 168 hours.  Studies/Results: No results found. Medications: . sodium chloride    . sodium chloride    . sodium chloride    . sodium chloride    . sodium chloride     . acetaminophen      . acetaminophen      . carvedilol  25 mg Oral BID WC  . Chlorhexidine Gluconate Cloth  6 each Topical Q0600  . Chlorhexidine Gluconate Cloth  6 each Topical Q0600  . Chlorhexidine Gluconate Cloth  6 each Topical Q0600  . cloNIDine  0.3  mg Oral TID  . darbepoetin (ARANESP) injection - DIALYSIS  100 mcg Intravenous Q Mon-HD  . heparin  5,000 Units Subcutaneous Q8H  . lidocaine-prilocaine  1 application Topical Q M,W,F-HD  . multivitamin  1 tablet Oral QHS  . senna  1 tablet Oral BID  . sevelamer carbonate  3,200 mg Oral TID WC  . sodium chloride flush  3 mL Intravenous Q12H

## 2021-02-01 NOTE — TOC Progression Note (Addendum)
Transition of Care [facility]) - Progression Note    Patient Details  Name: Rebekah Burton MRN: [id number] Date of Birth: 1976-09-02  Transition of Care [facility]) CM/SW Contact  Bartholomew Crews, RN Phone Number: [phone] 02/01/2021, 1:22 PM  Clinical Narrative:     Spoke with patient at the bedside. She has not received any calls. Left voicemail for Legrand Como at [employer]. Left voicemail for [place] to discuss possible transition?? Attempted to reach Earlean Polka at [facility] to follow up with information faxed last week - advised to call back about 2:30pm. TOC following for transition needs.   Expected Discharge Plan: [place] Services Barriers to Discharge: Other (comment) (working on dialysis and Medicaid transfers to [employer])  Expected Discharge Plan and Services Expected Discharge Plan: [place]   Discharge Planning Services: CM Consult Post Acute Care Choice: [place] arrangements for the past 2 months: Single Family Home                 DME Arranged: N/A DME Agency: NA       HH Arranged: RN [place] Agency: [facility] Date [place]: 01/21/21 Time [place]: [id number] Representative spoke with at [place]: [place] ([place]) Interventions    Readmission Risk Interventions No flowsheet data found.

## 2021-02-02 DIAGNOSIS — N611 Abscess of the breast and nipple: Secondary | ICD-10-CM | POA: Diagnosis not present

## 2021-02-02 MED ORDER — DARBEPOETIN ALFA 150 MCG/0.3ML IJ SOSY
150.0000 ug | PREFILLED_SYRINGE | INTRAMUSCULAR | Status: DC
  Administered 2021-02-08 – 2021-03-15 (×5): 150 ug via INTRAVENOUS
  Filled 2021-02-02 (×6): qty 0.3

## 2021-02-02 NOTE — Progress Notes (Signed)
Patient refused ordered wound care, patient was educated and RN will continue to monitor this patient

## 2021-02-02 NOTE — TOC Progression Note (Addendum)
Transition of Care [facility]) - Progression Note    Patient Details  Name: Rebekah Burton MRN: [id number] Date of Birth: 25-Sep-1976  Transition of Care [facility]) CM/SW Contact  Bartholomew Crews, RN Phone Number: [phone] 02/02/2021, 2:20 PM  Clinical Narrative:     Received email from [facility] - patient information has been forwarded to the CM, but at this time there are no available openings.   Attempted call to [employer] [facility] to speak with admissions, Hydia - left message requesting call back.   Expected Discharge Plan: [place] Services Barriers to Discharge: Other (comment) (working on dialysis and Medicaid transfers to [employer])  Expected Discharge Plan and Services Expected Discharge Plan: [place]   Discharge Planning Services: CM Consult Post Acute Care Choice: [place] arrangements for the past 2 months: Single Family Home                 DME Arranged: N/A DME Agency: NA       HH Arranged: RN [place] Agency: [facility] Date [place]: 01/21/21 Time [place]: [id number] Representative spoke with at [place]: [place] ([place]) Interventions    Readmission Risk Interventions No flowsheet data found.

## 2021-02-02 NOTE — Progress Notes (Signed)
PROGRESS NOTE  Rebekah Burton [id number] DOB: [birth year] DOA: 01/15/2021 PCP: Trey Sailors, PA   LOS: 18 days   Brief Narrative / Interim history: 45 year old female with ESRD on HD MWF, blindness, hypertension, PAD came into the hospital with a breast abscess.  General surgery consulted, she is status post incision and drainage.  She was also incidental Covid, now off contact precautions.  She was initially on IV antibiotics which were then transition to Augmentin and she has completed course for that. 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  IR and vascular consulted.  aVF now working.  Dialysis per nephrology.  Does not have any place to live.  Needs an address in order to continue outpatient dialysis.  TOC working on finding placement.  Subjective / 24h Interval events: Seen and examined.  She has no complaints.  Assessment & Plan: Principal Problem Breast abscess -General surgery consulted, she is status post I&D done by general surgery.  She was maintained on Unasyn, preliminary cultures show gram-positive, culture finalized with GPC's and mixed anaerobic flora.  She was transitioned to Augmentin and completed course.  Active Problems Incidental COVID-19-asymptomatic, no evidence of pulmonary involvement.  She had one positive and one negative test in the last few days, repeat the third test was negative, isolation discontinued on 1/25  ESRD-MWF schedule, nephrology consulted and following.  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  IR and vascular consulted.  aVF now working.  Dialysis per nephrology.  Anemia of chronic kidney disease-monitor, hemoglobin stable  Hypertension-continue home medications  Disposition-patient's relative, where she was living before did not want her to come back due to COVID-19.   She then decided to leave with her boyfriend in [place].  Process was a started to move her Medicaid/dialysis from [facility] to [facility] and process was anticipated to be completed on 01/26/2021 however right then, she decided to change her mind and she is not going to live with the boyfriend anymore and at the same time she does not have any other place at this point in time.  TOC is on board and working to find a placement for her.  Scheduled Meds: . carvedilol  25 mg Oral BID WC  . Chlorhexidine Gluconate Cloth  6 each Topical Q0600  . Chlorhexidine Gluconate Cloth  6 each Topical Q0600  . Chlorhexidine Gluconate Cloth  6 each Topical Q0600  . cloNIDine  0.3 mg Oral TID  . darbepoetin (ARANESP) injection - DIALYSIS  100 mcg Intravenous Q Mon-HD  . heparin  5,000 Units Subcutaneous Q8H  . lidocaine-prilocaine  1 application Topical Q M,W,F-HD  . multivitamin  1 tablet Oral QHS  . senna  1 tablet Oral BID  . sevelamer carbonate  3,200 mg Oral TID WC  . sodium chloride flush  3 mL Intravenous Q12H   Continuous Infusions: . sodium chloride     PRN Meds:.sodium chloride, acetaminophen **OR** [DISCONTINUED] acetaminophen, HYDROcodone-acetaminophen, sodium chloride flush  Diet Orders (From admission, onward)    Start     Ordered   01/25/21 1650  Diet renal with fluid restriction Fluid restriction: 1200 mL Fluid; Room service appropriate? Yes; Fluid consistency: Thin  Diet effective now       Question Answer Comment  Fluid restriction: 1200 mL Fluid   Room service appropriate? Yes   Fluid consistency: Thin  01/25/21 1649          DVT prophylaxis: heparin injection 5,000 Units Start: 01/26/21 0600     Code Status: Full Code  Family Communication: no family at bedside   Status is: Inpatient  Remains inpatient appropriate because: Unsafe discharge plan. Patient has no home to go to. She needs an address in order to continue dialysis. TOC is working on finding  some sort of help so she can be discharged to a motel or somewhere else.  Dispo: The patient is from: Home              Anticipated d/c is to: Home              Anticipated d/c date is: 1 day              Patient currently is medically stable to d/c.   Difficult to place patient Yes  Level of care: Med-Surg  Consultants:  General surgery  Nephrology   Procedures:  I&D  Microbiology  GPC in abscess culture  Antimicrobials: Unasyn   Augmentin Completed course for both of the antibiotics.  Objective: Vitals:   02/01/21 2132 02/02/21 0349 02/02/21 0821 02/02/21 1017  BP: 129/80 113/60 132/87 133/74  Pulse: 65 67  69  Resp: '20 20  18  '$ Temp: 99.2 F (37.3 C) 98.4 F (36.9 C)  98.5 F (36.9 C)  TempSrc: Oral Oral  Oral  SpO2: 100% 99%  100%  Weight:      Height:        Intake/Output Summary (Last 24 hours) at 02/02/2021 1115 Last data filed at 02/01/2021 1752 Gross per 24 hour  Intake 477 ml  Output --  Net 477 ml   Filed Weights   01/28/21 1035 02/01/21 0715 02/01/21 1019  Weight: 106.5 kg 105.3 kg 102.8 kg    Examination: General exam: Appears calm and comfortable  Respiratory system: Clear to auscultation. Respiratory effort normal. Cardiovascular system: S1 & S2 heard, RRR. No JVD, murmurs, rubs, gallops or clicks. No pedal edema. Gastrointestinal system: Abdomen is nondistended, soft and nontender. No organomegaly or masses felt. Normal bowel sounds heard. Central nervous system: Alert and oriented. No focal neurological deficits. Extremities: Symmetric 5 x 5 power. Skin: No rashes, lesions or ulcers.  Psychiatry: Judgement and insight appear normal. Mood & affect appropriate.     Data Reviewed: I have independently reviewed following labs and imaging studies   CBC: Recent Labs  Lab 01/26/21 1800 01/28/21 0817 01/30/21 1249 02/01/21 0637  WBC 7.6 5.6 6.0 5.7  HGB 8.0* 7.9* 8.2* 8.0*  HCT 24.7* 26.1* 26.4* 26.0*  MCV 95.0 96.7 97.4 97.7  PLT 242  246 253 [id number]   Basic Metabolic Panel: Recent Labs  Lab 01/26/21 1800 01/28/21 0817 01/30/21 1249 02/01/21 0636  NA 133* 136 137 132*  K 4.3 4.0 4.1 3.8  CL 91* 95* 93* 94*  CO2 '23 26 27 25  '$ GLUCOSE 86 112* 108* 99  BUN 61* 46* 42* 35*  CREATININE 14.98* 13.22* 12.76* 11.29*  CALCIUM 10.1 10.1 10.1 9.8  PHOS 6.6* 5.8* 5.8* 5.8*   Liver Function Tests: Recent Labs  Lab 01/26/21 1800 01/28/21 0817 01/30/21 1249 02/01/21 0636  ALBUMIN 2.7* 2.8* 2.9* 2.7*   Coagulation Profile: No results for input(s): INR, PROTIME in the last 168 hours. HbA1C: No results for input(s): HGBA1C in the last 72 hours. CBG: No results for input(s): GLUCAP in the last 168 hours.  No results found for this or  any previous visit (from the past 240 hour(s)).   Radiology Studies: No results found.  Darliss Cheney, MD [facility]  Between 7 am - 7 pm I am available, please contact me via Amion or [place]  Between 7 pm - 7 am I am not available, please contact night coverage MD/APP via Amion

## 2021-02-02 NOTE — Progress Notes (Signed)
Physical Therapy Treatment Patient Details Name: Rebekah Burton MRN: [id number] DOB: 08-02-1976 Today's Date: 02/02/2021    History of Present Illness 45 y.o. female with medical history significant of ESRD-HD M-W-F, blindness, HTN, PAD had developed a breast abscess and was seen in ED 01/04/21 where U/S confirmed abscess. She was to have been seen as outpatient for aspiration/drainage but did not get to make that appt. Today in HD the breast abscess opened and drained spontaneously. Because of continued drainage she presents to [facility] for evaluation. Found to be COVID+ taken to OR for further exploration, culture, and excisional debridement 01/15/21    PT Comments    Pt received sitting on EOB, very cooperative and pleasant. Pt was mod I for transfers and min guard for ambulation ~250 ft, which included 100 ft of ambulation with horizontal head turns and ~100 ft of ambulation with vertical head turns. Slight decrease in coordination and gait speed with head turns, but no overt LOB. Max cues needed for safe hallway navigation. Pt was left in chair with all needs met and call bell within reach.   Follow Up Recommendations  No PT follow up;Supervision for mobility/OOB     Equipment Recommendations  None recommended by PT    Recommendations for Other Services       Precautions / Restrictions Precautions Precautions: Other (comment) Precaution Comments: pt is blind, needs assist for navigation, L breast abscess    Mobility  Bed Mobility               General bed mobility comments: Pt received sitting on EOB  Transfers Overall transfer level: Modified independent Equipment used: None             General transfer comment: No physical assistance given, verbal description of pt's placement in room give, good hand placement and safety awareness  Ambulation/Gait Ambulation/Gait assistance: Min guard Gait Distance (Feet): 250 Feet (250 total, 100 feet with horizontal head turns,  100 feet with vertical head turns) Assistive device: None Gait Pattern/deviations: Step-through pattern;Decreased step length - right;Decreased step length - left;Drifts right/left Gait velocity: decreased   General Gait Details: Min guard for safety, slight decrease in coordination and gait speed with addition of head turns. 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/blindness                            Cognition Arousal/Alertness: Awake/alert Behavior During Therapy: WFL for tasks assessed/performed Overall Cognitive Status: Within Functional Limits for tasks assessed                                 General Comments: very pleasant and cooperative      Exercises      General Comments        Pertinent Vitals/Pain Pain Assessment: Faces Faces Pain Scale: No hurt    Home Living  Prior Function            PT Goals (current goals can now be found in the care plan section) Acute Rehab PT Goals Patient Stated Goal: have less pain PT Goal Formulation: With patient Time For Goal Achievement: 01/31/21 Potential to Achieve Goals: Good Additional Goals Additional Goal #1: Pt will be able to navigate her new home environment with supervision Progress towards PT goals: Progressing toward goals    Frequency    Min 3X/week      PT Plan Current plan remains appropriate    Co-evaluation              AM-PAC PT "6 Clicks" Mobility   Outcome  Measure  Help needed turning from your back to your side while in a flat bed without using bedrails?: None Help needed moving from lying on your back to sitting on the side of a flat bed without using bedrails?: None Help needed moving to and from a bed to a chair (including a wheelchair)?: None Help needed standing up from a chair using your arms (e.g., wheelchair or bedside chair)?: None Help needed to walk in hospital room?: A Little Help needed climbing 3-5 steps with a railing? : A Little 6 Click Score: 22    End of Session Equipment Utilized During Treatment: Gait belt Activity Tolerance: Patient tolerated treatment well Patient left: with call bell/phone within reach;in chair (sitting at EOB per her request) Nurse Communication: Mobility status PT Visit Diagnosis: Other abnormalities of gait and mobility (R26.89);Pain Pain - part of body:  (L breast and UE)     Time:  -     Charges:                        Rosita Kea, SPT

## 2021-02-02 NOTE — Progress Notes (Signed)
Subjective: Seen in room no complaints said tolerated dialysis yesterday  Objective Vital signs in last 24 hours: Vitals:   02/01/21 2132 02/02/21 0349 02/02/21 0821 02/02/21 1017  BP: 129/80 113/60 132/87 133/74  Pulse: 65 67  69  Resp: '20 20  18  '$ Temp: 99.2 F (37.3 C) 98.4 F (36.9 C)  98.5 F (36.9 C)  TempSrc: Oral Oral  Oral  SpO2: 100% 99%  100%  Weight:      Height:       Weight change:   Physical Exam General:WDWN female in NAD Heart:RRR, no RMG Lungs:CTAB, nml WOB Abdomen: Obese, soft, NTND Extremities:no LE edema, b/l TMA Dialysis Access:LU AVF positive bruit  Dialysis Orders: MWF [facility] 4h 54mn 2/2.5 bath 105kg AVF Heparin ? parsabiv 15 mg three times a week with HD mircera 150 mcg every 2 weeks - last given 01/13/21 hectorol 3 mcg three times a week  Problem/Plan: 1. End-stage renal disease -Typically MWF, off schedulelast week.  Now on schedule . 2. HD vascular access- history of cannulation issues AVF.IR consulted 01/25/21 which revealed significant pseudoaneurysm s/p thrombin injection and near complete thrombosis of pseudoaneurysm per IR. 3. Left breast abscess- s/p I&D, abx per primary svc 4. covid-19 infection- off of isolation. 5. HTN-BP  better control in HOSP than outpatient pre-HD at EDW follow-up post weight to lower EDW.Does not appear volume overloaded. UF as tolerated. Tolerated 2 L yesterday 6. Anemia CKD -Hgb 8.0.ContinueAranesp 100 mcg IV q Monday.^ 150 next treatment as, check iron study next treatment,transfuse prn 7. Metabolic bone disease-Phos 5.8. Corrected calcium high, hectorol on hold, use low Ca bath. Not able to get parsabiv here. Continue binders. 8. Social Issues: Safe d/c plan pending, now considering ALF  DErnest Haber PA-C [facility] [phone]/07/2021,12:18 PM  LOS: 18 days   Labs: Basic Metabolic Panel: Recent Labs  Lab 01/28/21 0817 01/30/21 1249 02/01/21 0636   NA 136 137 132*  K 4.0 4.1 3.8  CL 95* 93* 94*  CO2 '26 27 25  '$ GLUCOSE 112* 108* 99  BUN 46* 42* 35*  CREATININE 13.22* 12.76* 11.29*  CALCIUM 10.1 10.1 9.8  PHOS 5.8* 5.8* 5.8*   Liver Function Tests: Recent Labs  Lab 01/28/21 0817 01/30/21 1249 02/01/21 0636  ALBUMIN 2.8* 2.9* 2.7*   No results for input(s): LIPASE, AMYLASE in the last 168 hours. No results for input(s): AMMONIA in the last 168 hours. CBC: Recent Labs  Lab 01/26/21 1800 01/28/21 0817 01/30/21 1249 02/01/21 0637  WBC 7.6 5.6 6.0 5.7  HGB 8.0* 7.9* 8.2* 8.0*  HCT 24.7* 26.1* 26.4* 26.0*  MCV 95.0 96.7 97.4 97.7  PLT 242 246 253 218   Cardiac Enzymes: No results for input(s): CKTOTAL, CKMB, CKMBINDEX, TROPONINI in the last 168 hours. CBG: No results for input(s): GLUCAP in the last 168 hours.  Studies/Results: No results found. Medications: . sodium chloride     . carvedilol  25 mg Oral BID WC  . Chlorhexidine Gluconate Cloth  6 each Topical Q0600  . Chlorhexidine Gluconate Cloth  6 each Topical Q0600  . Chlorhexidine Gluconate Cloth  6 each Topical Q0600  . cloNIDine  0.3 mg Oral TID  . darbepoetin (ARANESP) injection - DIALYSIS  100 mcg Intravenous Q Mon-HD  . heparin  5,000 Units Subcutaneous Q8H  . lidocaine-prilocaine  1 application Topical Q M,W,F-HD  . multivitamin  1 tablet Oral QHS  . senna  1 tablet Oral BID  . sevelamer carbonate  3,200  mg Oral TID WC  . sodium chloride flush  3 mL Intravenous Q12H

## 2021-02-03 DIAGNOSIS — N186 End stage renal disease: Secondary | ICD-10-CM | POA: Diagnosis not present

## 2021-02-03 DIAGNOSIS — N611 Abscess of the breast and nipple: Secondary | ICD-10-CM | POA: Diagnosis not present

## 2021-02-03 DIAGNOSIS — U071 COVID-19: Secondary | ICD-10-CM | POA: Diagnosis not present

## 2021-02-03 LAB — RENAL FUNCTION PANEL
Albumin: 2.9 g/dL — ABNORMAL LOW (ref 3.5–5.0)
Anion gap: 14 (ref 5–15)
BUN: 53 mg/dL — ABNORMAL HIGH (ref 6–20)
CO2: 26 mmol/L (ref 22–32)
Calcium: 10.4 mg/dL — ABNORMAL HIGH (ref 8.9–10.3)
Chloride: 94 mmol/L — ABNORMAL LOW (ref 98–111)
Creatinine, Ser: 12.01 mg/dL — ABNORMAL HIGH (ref 0.44–1.00)
GFR, Estimated: 4 mL/min — ABNORMAL LOW (ref >60)
Glucose, Bld: 115 mg/dL — ABNORMAL HIGH (ref 70–99)
Phosphorus: 5.6 mg/dL — ABNORMAL HIGH (ref 2.5–4.6)
Potassium: 4.2 mmol/L (ref 3.5–5.1)
Sodium: 134 mmol/L — ABNORMAL LOW (ref 135–145)

## 2021-02-03 LAB — CBC
HCT: 26.2 % — ABNORMAL LOW (ref 36.0–46.0)
Hemoglobin: 8.4 g/dL — ABNORMAL LOW (ref 12.0–15.0)
MCH: 31.1 pg (ref 26.0–34.0)
MCHC: 32.1 g/dL (ref 30.0–36.0)
MCV: 97 fL (ref 80.0–100.0)
Platelets: 208 10*3/uL (ref 150–400)
RBC: 2.7 MIL/uL — ABNORMAL LOW (ref 3.87–5.11)
RDW: 17.6 % — ABNORMAL HIGH (ref 11.5–15.5)
WBC: 6.2 10*3/uL (ref 4.0–10.5)
nRBC: 0 % (ref 0.0–0.2)

## 2021-02-03 LAB — IRON AND TIBC
Iron: 88 ug/dL (ref 28–170)
Saturation Ratios: 34 % — ABNORMAL HIGH (ref 10.4–31.8)
TIBC: 259 ug/dL (ref 250–450)
UIBC: 171 ug/dL

## 2021-02-03 MED ORDER — ACETAMINOPHEN 325 MG PO TABS
ORAL_TABLET | ORAL | Status: AC
  Filled 2021-02-03: qty 2

## 2021-02-03 NOTE — Progress Notes (Signed)
Subjective: Seen in room no complaints for dialysis today  Objective Vital signs in last 24 hours: Vitals:   02/02/21 1644 02/02/21 2052 02/03/21 0504 02/03/21 1059  BP: (!) 162/75 137/68 118/73 (!) 153/91  Pulse: 64 68 72 67  Resp: '16 16 18 17  '$ Temp: 98.9 F (37.2 C) 98.6 F (37 C) 98.2 F (36.8 C) 98.6 F (37 C)  TempSrc: Oral   Oral  SpO2: 99% 100% 100% 100%  Weight:      Height:       Weight change:   Physical Exam General: Obese WDWN female in NAD Heart:RRR, no RMG Lungs:CTAB, nml WOB Abdomen:Obese, soft, NTND Extremities:no LE edema, b/l TMA Dialysis Access:LU AVF positive bruit  Dialysis Orders: [place] 4h 75mn 2/2.5 bath 105kg AVF Heparin ? parsabiv 15 mg three times a week with HD mircera 150 mcg every 2 weeks - last given 01/13/21 hectorol 3 mcg three times a week  Problem/Plan: 1. End-stage renal disease -Typically MWF, off schedulelast week.Now on schedule . 2. HD vascular access- history ofcannulationissuesAVF.IR consulted 01/25/21 which revealed significant pseudoaneurysm s/p thrombin injection and near complete thrombosis of pseudoaneurysm per IR. 3. Left breast abscess- s/p I&D, abx per primary svc 4. covid-19 infection- off of isolation. 5. HTN-BPbetter control inHOSPthan outpatientpre-HD at EDW follow-up post weight to lower EDW.Does not appear volume overloaded. UF as tolerated. Tolerated 2 L yesterday 6. Anemia CKD -Hgb 8.0.ContinueAranesp 100 mcg IV q Monday.^150 last given 2/14, check iron study,transfuse prn 7. Metabolic bone [id number] Corrected calcium high, hectorol on hold, use low Ca bath. Not able to get parsabiv here. Continue binders. 8. Social Issues: Awaiting safe d/c plan pending ?  ALF   DErnest Haber PA-C [facility] [phone]/08/2021,11:31 AM  LOS: 19 days   Labs: Basic Metabolic Panel: Recent Labs  Lab 01/28/21 0817 01/30/21 1249 02/01/21 0636  NA  136 137 132*  K 4.0 4.1 3.8  CL 95* 93* 94*  CO2 '26 27 25  '$ GLUCOSE 112* 108* 99  BUN 46* 42* 35*  CREATININE 13.22* 12.76* 11.29*  CALCIUM 10.1 10.1 9.8  PHOS 5.8* 5.8* 5.8*   Liver Function Tests: Recent Labs  Lab 01/28/21 0817 01/30/21 1249 02/01/21 0636  ALBUMIN 2.8* 2.9* 2.7*   No results for input(s): LIPASE, AMYLASE in the last 168 hours. No results for input(s): AMMONIA in the last 168 hours. CBC: Recent Labs  Lab 01/28/21 0817 01/30/21 1249 02/01/21 0637  WBC 5.6 6.0 5.7  HGB 7.9* 8.2* 8.0*  HCT 26.1* 26.4* 26.0*  MCV 96.7 97.4 97.7  PLT 246 253 218   Cardiac Enzymes: No results for input(s): CKTOTAL, CKMB, CKMBINDEX, TROPONINI in the last 168 hours. CBG: No results for input(s): GLUCAP in the last 168 hours.  Studies/Results: No results found. Medications: . sodium chloride     . carvedilol  25 mg Oral BID WC  . Chlorhexidine Gluconate Cloth  6 each Topical Q0600  . Chlorhexidine Gluconate Cloth  6 each Topical Q0600  . Chlorhexidine Gluconate Cloth  6 each Topical Q0600  . cloNIDine  0.3 mg Oral TID  . [START ON 02/08/2021] darbepoetin (ARANESP) injection - DIALYSIS  150 mcg Intravenous Q Mon-HD  . heparin  5,000 Units Subcutaneous Q8H  . lidocaine-prilocaine  1 application Topical Q M,W,F-HD  . multivitamin  1 tablet Oral QHS  . senna  1 tablet Oral BID  . sevelamer carbonate  3,200 mg Oral TID WC  . sodium chloride flush  3 mL  Intravenous Q12H

## 2021-02-03 NOTE — Progress Notes (Signed)
Patient ID: Rebekah Burton, female   DOB: [birth year], 45 y.o.   MRN: [id number]  PROGRESS NOTE    Rebekah Burton  [id number] DOB: [birth year] DOA: 01/15/2021 PCP: Trey Sailors, PA   Brief Narrative:  45 year old female with ESRD on HD MWF, blindness, hypertension, PAD came into the hospital with a breast abscess.  General surgery consulted, she underwent incision and drainage.  She was also incidental Covid, now off contact precautions.  She was initially on IV antibiotics which were then transitioned to Augmentin and she has completed course for that. 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  IR and vascular consulted.  aVF now working.  Dialysis per nephrology.  Does not have any place to live.  Needs an address in order to continue outpatient dialysis.  TOC working on finding placement.  Assessment & Plan:   Breast abscess  -General surgery consulted, she is status post I&D done by general surgery.  She was maintained on Unasyn, preliminary cultures show gram-positive, culture finalized with GPC's and mixed anaerobic flora.  She was transitioned to Augmentin and completed course. -Currently afebrile and hemodynamically stable  Incidental COVID-19 -asymptomatic, no evidence of pulmonary involvement.   -isolation discontinued on 1/25 -Currently on room air  ESRD -nephrology following following.  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  IR and vascular consulted.  aVF now working.  Dialysis per nephrology.  Anemia of chronic kidney disease -monitor, hemoglobin stable  Hyponatremia -Being managed with dialysis and by nephrology  Hypertension -continue clonidine and Coreg  Disposition-patient's relative, where she was living before did not want her to come back due to COVID-19.  She then decided to leave with  her boyfriend in [place].  Process was started to move her Medicaid/dialysis from [facility] to [facility] and process was anticipated to be completed on 01/26/2021 however right then, she decided to change her mind and she is not going to live with the boyfriend anymore and at the same time she does not have any other place at this point in time.  TOC is on board and working to find a placement for her.  Obesity -Outpatient follow-up  DVT prophylaxis: Heparin Code Status: Full Family Communication: None Disposition Plan: Status is: Inpatient  Remains inpatient appropriate because:Unsafe d/c plan   Dispo: The patient is from: Home              Anticipated d/c is to: Home              Anticipated d/c date is: 1 day              Patient currently is medically stable to d/c.   Difficult to place patient Yes  Consultants: Nephrology/general surgery  Procedures: I&D of breast abscess  Antimicrobials:  Unasyn and subsequent Augmentin: Completed course  Subjective: Patient seen and examined at bedside.  Denies worsening shortness of breath.  No overnight fever or vomiting reported.  Objective: Vitals:   02/02/21 1017 02/02/21 1644 02/02/21 2052 02/03/21 0504  BP: 133/74 (!) 162/75 137/68 118/73  Pulse: 69 64 68 72  Resp: '18 16 16 18  '$ Temp: 98.5 F (36.9 C) 98.9 F (37.2 C) 98.6 F (37 C) 98.2 F (36.8 C)  TempSrc: Oral Oral    SpO2: 100% 99% 100% 100%  Weight:      Height:  Intake/Output Summary (Last 24 hours) at 02/03/2021 1058 Last data filed at 02/03/2021 0600 Gross per 24 hour  Intake 457 ml  Output 0 ml  Net 457 ml   Filed Weights   01/28/21 1035 02/01/21 0715 02/01/21 1019  Weight: 106.5 kg 105.3 kg 102.8 kg    Examination:  General exam: Appears calm and comfortable.  Looks chronically ill. Respiratory system: Bilateral decreased breath sounds at bases with some scattered crackles Cardiovascular system: S1 & S2 heard, Rate  controlled Gastrointestinal system: Abdomen is nondistended, soft and nontender. Normal bowel sounds heard. Extremities: No cyanosis, clubbing; trace lower extremity edema   Data Reviewed: I have personally reviewed following labs and imaging studies  CBC: Recent Labs  Lab 01/28/21 0817 01/30/21 1249 02/01/21 0637  WBC 5.6 6.0 5.7  HGB 7.9* 8.2* 8.0*  HCT 26.1* 26.4* 26.0*  MCV 96.7 97.4 97.7  PLT 246 253 [id number]   Basic Metabolic Panel: Recent Labs  Lab 01/28/21 0817 01/30/21 1249 02/01/21 0636  NA 136 137 132*  K 4.0 4.1 3.8  CL 95* 93* 94*  CO2 '26 27 25  '$ GLUCOSE 112* 108* 99  BUN 46* 42* 35*  CREATININE 13.22* 12.76* 11.29*  CALCIUM 10.1 10.1 9.8  PHOS 5.8* 5.8* 5.8*   GFR: Estimated Creatinine Clearance: 7.6 mL/min (A) (by C-G formula based on SCr of 11.29 mg/dL (H)). Liver Function Tests: Recent Labs  Lab 01/28/21 0817 01/30/21 1249 02/01/21 0636  ALBUMIN 2.8* 2.9* 2.7*   No results for input(s): LIPASE, AMYLASE in the last 168 hours. No results for input(s): AMMONIA in the last 168 hours. Coagulation Profile: No results for input(s): INR, PROTIME in the last 168 hours. Cardiac Enzymes: No results for input(s): CKTOTAL, CKMB, CKMBINDEX, TROPONINI in the last 168 hours. BNP (last 3 results) No results for input(s): PROBNP in the last 8760 hours. HbA1C: No results for input(s): HGBA1C in the last 72 hours. CBG: No results for input(s): GLUCAP in the last 168 hours. Lipid Profile: No results for input(s): CHOL, HDL, LDLCALC, TRIG, CHOLHDL, LDLDIRECT in the last 72 hours. Thyroid Function Tests: No results for input(s): TSH, T4TOTAL, FREET4, T3FREE, THYROIDAB in the last 72 hours. Anemia Panel: No results for input(s): VITAMINB12, FOLATE, FERRITIN, TIBC, IRON, RETICCTPCT in the last 72 hours. Sepsis Labs: No results for input(s): PROCALCITON, LATICACIDVEN in the last 168 hours.  No results found for this or any previous visit (from the past 240 hour(s)).        Radiology Studies: No results found.      Scheduled Meds: . carvedilol  25 mg Oral BID WC  . Chlorhexidine Gluconate Cloth  6 each Topical Q0600  . Chlorhexidine Gluconate Cloth  6 each Topical Q0600  . Chlorhexidine Gluconate Cloth  6 each Topical Q0600  . cloNIDine  0.3 mg Oral TID  . [START ON 02/08/2021] darbepoetin (ARANESP) injection - DIALYSIS  150 mcg Intravenous Q Mon-HD  . heparin  5,000 Units Subcutaneous Q8H  . lidocaine-prilocaine  1 application Topical Q M,W,F-HD  . multivitamin  1 tablet Oral QHS  . senna  1 tablet Oral BID  . sevelamer carbonate  3,200 mg Oral TID WC  . sodium chloride flush  3 mL Intravenous Q12H   Continuous Infusions: . sodium chloride            Aline August, MD [facility] 02/03/2021, 10:58 AM

## 2021-02-03 NOTE — Plan of Care (Signed)
  Problem: Education: Goal: Knowledge of General Education information will improve Description: Including pain rating scale, medication(s)/side effects and non-pharmacologic comfort measures Outcome: Progressing   Problem: Elimination: Goal: Will not experience complications related to bowel motility Outcome: Progressing   Problem: Pain Managment: Goal: General experience of comfort will improve Outcome: Progressing   

## 2021-02-03 NOTE — Plan of Care (Signed)
  Problem: Education: Goal: Knowledge of General Education information will improve Description: Including pain rating scale, medication(s)/side effects and non-pharmacologic comfort measures Outcome: Progressing   Problem: Pain Managment: Goal: General experience of comfort will improve Outcome: Progressing   

## 2021-02-04 DIAGNOSIS — N186 End stage renal disease: Secondary | ICD-10-CM | POA: Diagnosis not present

## 2021-02-04 DIAGNOSIS — U071 COVID-19: Secondary | ICD-10-CM | POA: Diagnosis not present

## 2021-02-04 DIAGNOSIS — N611 Abscess of the breast and nipple: Secondary | ICD-10-CM | POA: Diagnosis not present

## 2021-02-04 NOTE — TOC Progression Note (Signed)
Transition of Care [facility]) - Progression Note    Patient Details  Name: Henessey Vanburen MRN: [id number] Date of Birth: March 24, 1976  Transition of Care [facility]) CM/SW Contact  Bartholomew Crews, RN Phone Number: [phone] 02/04/2021, 3:46 PM  Clinical Narrative:     Damaris Schooner with Hydia at [facility] [phone] about possible bed offer. Hydia is planning to visit patient at the hospital tomorrow to assess patient for appropriateness for facility. Spoke with patient at the bedside to discuss visit in the morning. Patient stated that she appreciates the care she has received at the hospital, however, she is ready to be discharged.   Patient stated that her current cell phone has been disconnected d/t having changed her care number which was used for auto pay. Patient attempted to rectify situation, however, phone remains off. Patient stated that she spoke with her case worker at [employer], Cherene Altes, who advised her to go online to [email or web address] where she can get a phone as part of her Medicaid benefit. Discussed that this can be handled at the facility once she has a known address for delivery of phone. Patient verbalized understanding.   TOC following for transition needs.   Expected Discharge Plan: [place] [facility] Barriers to Discharge: Other (comment) (homeless - looking for resorces in [facility])  Expected Discharge Plan and Services Expected Discharge Plan: [place]   Discharge Planning Services: CM Consult Post Acute Care Choice: [place] arrangements for the past 2 months: Single Family Home                 DME Arranged: N/A DME Agency: NA       HH Arranged: RN [place] Agency: [facility] Date HH Agency Contacted: 01/21/21 Time [place]: [id number] Representative spoke with at [place]: [place] ([place]) Interventions    Readmission Risk Interventions No flowsheet data found.

## 2021-02-04 NOTE — Care Management Important Message (Signed)
Important Message  Patient Details  Name: Rebekah Burton MRN: [id number] Date of Birth: 1976-12-12   Medicare Important Message Given:  Yes - Important Message mailed due to current National Emergency   Verbal consent obtained due to current National Emergency  Relationship to patient: Self Contact Name: Katarzyna Wente Call Date: 02/04/21  Time: 1219 Phone: [phone] Outcome: Spoke with contact Important Message mailed to: Other (must enter comment) (patient declined additional copy of IM)    [name] P [name] 02/04/2021, 12:20 PM

## 2021-02-04 NOTE — TOC Progression Note (Signed)
Transition of Care  [facility]) - Progression Note    Patient Details  Name: Rebekah Burton MRN: [id number] Date of Birth: 16-Jun-1976  Transition of Care [facility]) CM/SW Contact  Bartholomew Crews, RN Phone Number: [phone] 02/04/2021, 11:28 AM  Clinical Narrative:     Spoke with patient at the bedside. She stated that she received call from the [place] yesterday. She is now on the list for the waiting list (but not on the waiting list yet). Patient stated that she was told the list is quite long at this time. TOC following for transition needs.   Expected Discharge Plan: [place] Services Barriers to Discharge: Other (comment) (homeless - looking for resorces in [facility])  Expected Discharge Plan and Services Expected Discharge Plan: [place]   Discharge Planning Services: CM Consult Post Acute Care Choice: [place] arrangements for the past 2 months: Single Family Home                 DME Arranged: N/A DME Agency: NA       HH Arranged: RN [place] Agency: [facility] Date HH Agency Contacted: 01/21/21 Time [place]: [id number] Representative spoke with at [place]: [place] ([place]) Interventions    Readmission Risk Interventions No flowsheet data found.

## 2021-02-04 NOTE — Plan of Care (Signed)
  Problem: Education: Goal: Knowledge of General Education information will improve Description: Including pain rating scale, medication(s)/side effects and non-pharmacologic comfort measures Outcome: Progressing   Problem: Elimination: Goal: Will not experience complications related to bowel motility Outcome: Progressing   Problem: Pain Managment: Goal: General experience of comfort will improve Outcome: Progressing   

## 2021-02-04 NOTE — Progress Notes (Signed)
Patient ID: Rebekah Burton, female   DOB: 01-31-1976, 45 y.o.   MRN: [id number]  PROGRESS NOTE    Rebekah Burton  [id number] DOB: 1976-04-25 DOA: 01/15/2021 PCP: Trey Sailors, PA   Brief Narrative:  45 year old female with ESRD on HD MWF, blindness, hypertension, PAD came into the hospital with a breast abscess.  General surgery consulted, she underwent incision and drainage.  She was also incidental Covid, now off contact precautions.  She was initially on IV antibiotics which were then transitioned to Augmentin and she has completed course for that. 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  IR and vascular consulted.  aVF now working.  Dialysis per nephrology.  Does not have any place to live.  Needs an address in order to continue outpatient dialysis.  TOC working on finding placement.  Assessment & Plan:   Breast abscess  -General surgery consulted, she is status post I&D done by general surgery.  She was maintained on Unasyn, preliminary cultures show gram-positive, culture finalized with GPC's and mixed anaerobic flora.  She was transitioned to Augmentin and completed course. -Currently afebrile and hemodynamically stable  Incidental COVID-19 -asymptomatic, no evidence of pulmonary involvement.   -isolation discontinued on 1/25 -Currently on room air  ESRD -nephrology following following.  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  IR and vascular consulted.  aVF now working.  Dialysis per nephrology.  Anemia of chronic kidney disease -monitor, hemoglobin stable  Hyponatremia -Being managed with dialysis and by nephrology  Hypertension -continue clonidine and Coreg  Disposition-patient's relative, where she was living before did not want her to come back due to COVID-19.  She then decided to leave with  her boyfriend in [place].  Process was started to move her Medicaid/dialysis from [facility] to [facility] and process was anticipated to be completed on 01/26/2021 however right then, she decided to change her mind and she is not going to live with the boyfriend anymore and at the same time she does not have any other place at this point in time.  TOC is on board and working to find a placement for her.  Obesity -Outpatient follow-up  DVT prophylaxis: Heparin Code Status: Full Family Communication: None Disposition Plan: Status is: Inpatient  Remains inpatient appropriate because:Unsafe d/c plan   Dispo: The patient is from: Home              Anticipated d/c is to: Home              Anticipated d/c date is: 1 day              Patient currently is medically stable to d/c.   Difficult to place patient Yes  Consultants: Nephrology/general surgery  Procedures: I&D of breast abscess  Antimicrobials:  Unasyn and subsequent Augmentin: Completed course  Subjective: Patient seen and examined at bedside.  No new complaints.  Denies fever, vomiting or worsening shortness of breath or chest pain. Objective: Vitals:   02/03/21 1512 02/03/21 1736 02/03/21 2029 02/04/21 0503  BP: (!) 150/83 (!) 147/80 136/69 135/81  Pulse: 66 75 65 70  Resp: '18 18 18 18  '$ Temp: 97.7 F (36.5 C) 98.4 F (36.9 C) 99.4 F (37.4 C) 98.5 F (36.9 C)  TempSrc: Oral Oral Oral   SpO2: 99% 100% 98% 99%  Weight: 105 kg  Height:        Intake/Output Summary (Last 24 hours) at 02/04/2021 0750 Last data filed at 02/04/2021 0200 Gross per 24 hour  Intake 720 ml  Output 2000 ml  Net -1280 ml   Filed Weights   02/01/21 1019 02/03/21 1220 02/03/21 1512  Weight: 102.8 kg 107 kg 105 kg    Examination:  General exam: No acute distress.  Looks chronically ill.  Currently on room air Respiratory system: Decreased sounds at bases bilaterally with some crackles, no wheezing  cardiovascular  system: Rate controlled, S1-S2 heard Gastrointestinal system: Abdomen is nondistended, soft and nontender.  Bowel sounds are heard  extremities: Mild lower extremity edema present; no clubbing   Data Reviewed: I have personally reviewed following labs and imaging studies  CBC: Recent Labs  Lab 01/28/21 0817 01/30/21 1249 02/01/21 0637 02/03/21 1244  WBC 5.6 6.0 5.7 6.2  HGB 7.9* 8.2* 8.0* 8.4*  HCT 26.1* 26.4* 26.0* 26.2*  MCV 96.7 97.4 97.7 97.0  PLT 246 253 218 [id number]   Basic Metabolic Panel: Recent Labs  Lab 01/28/21 0817 01/30/21 1249 02/01/21 0636 02/03/21 1244  NA 136 137 132* 134*  K 4.0 4.1 3.8 4.2  CL 95* 93* 94* 94*  CO2 '26 27 25 26  '$ GLUCOSE 112* 108* 99 115*  BUN 46* 42* 35* 53*  CREATININE 13.22* 12.76* 11.29* 12.01*  CALCIUM 10.1 10.1 9.8 10.4*  PHOS 5.8* 5.8* 5.8* 5.6*   GFR: Estimated Creatinine Clearance: 7.2 mL/min (A) (by C-G formula based on SCr of 12.01 mg/dL (H)). Liver Function Tests: Recent Labs  Lab 01/28/21 0817 01/30/21 1249 02/01/21 0636 02/03/21 1244  ALBUMIN 2.8* 2.9* 2.7* 2.9*   No results for input(s): LIPASE, AMYLASE in the last 168 hours. No results for input(s): AMMONIA in the last 168 hours. Coagulation Profile: No results for input(s): INR, PROTIME in the last 168 hours. Cardiac Enzymes: No results for input(s): CKTOTAL, CKMB, CKMBINDEX, TROPONINI in the last 168 hours. BNP (last 3 results) No results for input(s): PROBNP in the last 8760 hours. HbA1C: No results for input(s): HGBA1C in the last 72 hours. CBG: No results for input(s): GLUCAP in the last 168 hours. Lipid Profile: No results for input(s): CHOL, HDL, LDLCALC, TRIG, CHOLHDL, LDLDIRECT in the last 72 hours. Thyroid Function Tests: No results for input(s): TSH, T4TOTAL, FREET4, T3FREE, THYROIDAB in the last 72 hours. Anemia Panel: Recent Labs    02/03/21 1244  TIBC 259  IRON 88   Sepsis Labs: No results for input(s): PROCALCITON, LATICACIDVEN in the  last 168 hours.  No results found for this or any previous visit (from the past 240 hour(s)).       Radiology Studies: No results found.      Scheduled Meds: . carvedilol  25 mg Oral BID WC  . Chlorhexidine Gluconate Cloth  6 each Topical Q0600  . cloNIDine  0.3 mg Oral TID  . [START ON 02/08/2021] darbepoetin (ARANESP) injection - DIALYSIS  150 mcg Intravenous Q Mon-HD  . heparin  5,000 Units Subcutaneous Q8H  . lidocaine-prilocaine  1 application Topical Q M,W,F-HD  . multivitamin  1 tablet Oral QHS  . senna  1 tablet Oral BID  . sevelamer carbonate  3,200 mg Oral TID WC  . sodium chloride flush  3 mL Intravenous Q12H   Continuous Infusions: . sodium chloride            Aline August, MD [facility] 02/04/2021, 7:50 AM

## 2021-02-04 NOTE — Plan of Care (Signed)
  Problem: Education: Goal: Knowledge of General Education information will improve Description: Including pain rating scale, medication(s)/side effects and non-pharmacologic comfort measures Outcome: Progressing   Problem: Health Behavior/Discharge Planning: Goal: Ability to manage health-related needs will improve Outcome: Progressing   Problem: Elimination: Goal: Will not experience complications related to bowel motility Outcome: Progressing   Problem: Pain Managment: Goal: General experience of comfort will improve Outcome: Progressing

## 2021-02-04 NOTE — Progress Notes (Signed)
PT Cancellation Note  Patient Details Name: Rebekah Burton MRN: [id number] DOB: June 27, 1976   Cancelled Treatment:    Reason Eval/Treat Not Completed: Patient declined, no reason specified politely declines PT, reports she is not feeling well today and her breast is hurting. Agreeable to trying tomorrow. Will continue to follow.    Windell Norfolk, DPT, PN1   Supplemental Physical Therapist [facility]    Pager [phone] Acute Rehab Office [phone]

## 2021-02-04 NOTE — Progress Notes (Signed)
Subjective: Seen in room no complaints, said tolerated dialysis yesterday, awaiting placement results still  Objective Vital signs in last 24 hours: Vitals:   02/03/21 1736 02/03/21 2029 02/04/21 0503 02/04/21 1108  BP: (!) 147/80 136/69 135/81 (!) 143/75  Pulse: 75 65 70 69  Resp: '18 18 18 17  '$ Temp: 98.4 F (36.9 C) 99.4 F (37.4 C) 98.5 F (36.9 C) 98.2 F (36.8 C)  TempSrc: Oral Oral    SpO2: 100% 98% 99% 99%  Weight:      Height:       Weight change:   Physical Exam General: Obese WDWN female in NAD Heart:RRR, no RMG Lungs:CTAB, nml WOB Abdomen:Obese, soft, NTND Extremities:no LE edema, b/l TMA Dialysis Access:LU AVFpositive bruit  Dialysis Orders: [place] 4h 26mn 2/2.5 bath 105kg AVF Heparin ? parsabiv 15 mg three times a week with HD mircera 150 mcg every 2 weeks - last given 01/13/21 hectorol 3 mcg three times a week  Problem/Plan: 1. End-stage renal disease -Typically MWF, off schedulelast week.Now on schedule . 2. HD vascular access- history ofcannulationissuesAVF.IR consulted 01/25/21 which revealed significant pseudoaneurysm s/p thrombin injection and near complete thrombosis of pseudoaneurysm per IR. Yesterday no complications reported 3. Left breast abscess- s/p I&D, abx per primary svc 4. covid-19 infection- off of isolation. 5. HTN-BPbetter control inHOSPthan outpatientpre-HD at EDW follow-up post weight to lower EDW.Does not appear volume overloaded. UF as tolerated.Tolerated 2 L yesterday at EDW by weights 6. Anemia CKD -Hgb 8.4.ContinueAranesp 100 mcg IV q Monday.^150 last given 2/14, iron saturation 34%, ferritin 1976 7. Metabolic bone [id number] Corrected calcium high, hectorol on hold, use low Ca bath. Not able to get parsabiv here. Continue binders. 8. Social Issues: Awaiting safe d/c plan pending ?  ALF  DErnest Haber PA-C [facility] [phone]/09/2021,12:50 PM  LOS: 20  days   Labs: Basic Metabolic Panel: Recent Labs  Lab 01/30/21 1249 02/01/21 0636 02/03/21 1244  NA 137 132* 134*  K 4.1 3.8 4.2  CL 93* 94* 94*  CO2 '27 25 26  '$ GLUCOSE 108* 99 115*  BUN 42* 35* 53*  CREATININE 12.76* 11.29* 12.01*  CALCIUM 10.1 9.8 10.4*  PHOS 5.8* 5.8* 5.6*   Liver Function Tests: Recent Labs  Lab 01/30/21 1249 02/01/21 0636 02/03/21 1244  ALBUMIN 2.9* 2.7* 2.9*   No results for input(s): LIPASE, AMYLASE in the last 168 hours. No results for input(s): AMMONIA in the last 168 hours. CBC: Recent Labs  Lab 01/30/21 1249 02/01/21 0637 02/03/21 1244  WBC 6.0 5.7 6.2  HGB 8.2* 8.0* 8.4*  HCT 26.4* 26.0* 26.2*  MCV 97.4 97.7 97.0  PLT 253 218 208   Cardiac Enzymes: No results for input(s): CKTOTAL, CKMB, CKMBINDEX, TROPONINI in the last 168 hours. CBG: No results for input(s): GLUCAP in the last 168 hours.  Studies/Results: No results found. Medications: . sodium chloride     . carvedilol  25 mg Oral BID WC  . Chlorhexidine Gluconate Cloth  6 each Topical Q0600  . cloNIDine  0.3 mg Oral TID  . [START ON 02/08/2021] darbepoetin (ARANESP) injection - DIALYSIS  150 mcg Intravenous Q Mon-HD  . heparin  5,000 Units Subcutaneous Q8H  . lidocaine-prilocaine  1 application Topical Q M,W,F-HD  . multivitamin  1 tablet Oral QHS  . senna  1 tablet Oral BID  . sevelamer carbonate  3,200 mg Oral TID WC  . sodium chloride flush  3 mL Intravenous Q12H

## 2021-02-05 DIAGNOSIS — N186 End stage renal disease: Secondary | ICD-10-CM | POA: Diagnosis not present

## 2021-02-05 DIAGNOSIS — N611 Abscess of the breast and nipple: Secondary | ICD-10-CM | POA: Diagnosis not present

## 2021-02-05 DIAGNOSIS — U071 COVID-19: Secondary | ICD-10-CM | POA: Diagnosis not present

## 2021-02-05 LAB — CBC
HCT: 27.5 % — ABNORMAL LOW (ref 36.0–46.0)
Hemoglobin: 8.4 g/dL — ABNORMAL LOW (ref 12.0–15.0)
MCH: 30 pg (ref 26.0–34.0)
MCHC: 30.5 g/dL (ref 30.0–36.0)
MCV: 98.2 fL (ref 80.0–100.0)
Platelets: 245 10*3/uL (ref 150–400)
RBC: 2.8 MIL/uL — ABNORMAL LOW (ref 3.87–5.11)
RDW: 17.6 % — ABNORMAL HIGH (ref 11.5–15.5)
WBC: 6 10*3/uL (ref 4.0–10.5)
nRBC: 0 % (ref 0.0–0.2)

## 2021-02-05 LAB — RENAL FUNCTION PANEL
Albumin: 3 g/dL — ABNORMAL LOW (ref 3.5–5.0)
Anion gap: 13 (ref 5–15)
BUN: 49 mg/dL — ABNORMAL HIGH (ref 6–20)
CO2: 23 mmol/L (ref 22–32)
Calcium: 10.3 mg/dL (ref 8.9–10.3)
Chloride: 97 mmol/L — ABNORMAL LOW (ref 98–111)
Creatinine, Ser: 11.52 mg/dL — ABNORMAL HIGH (ref 0.44–1.00)
GFR, Estimated: 4 mL/min — ABNORMAL LOW (ref >60)
Glucose, Bld: 98 mg/dL (ref 70–99)
Phosphorus: 5.4 mg/dL — ABNORMAL HIGH (ref 2.5–4.6)
Potassium: 4.2 mmol/L (ref 3.5–5.1)
Sodium: 133 mmol/L — ABNORMAL LOW (ref 135–145)

## 2021-02-05 MED ORDER — ACETAMINOPHEN 325 MG PO TABS
ORAL_TABLET | ORAL | Status: AC
  Filled 2021-02-05: qty 2

## 2021-02-05 NOTE — Progress Notes (Signed)
Physical Therapy Treatment Patient Details Name: Rebekah Burton MRN: [id number] DOB: 02-18-1976 Today's Date: 02/05/2021    History of Present Illness 45 y.o. female with medical history significant of ESRD-HD M-W-F, blindness, HTN, PAD had developed a breast abscess and was seen in ED 01/04/21 where U/S confirmed abscess. She was to have been seen as outpatient for aspiration/drainage but did not get to make that appt. Today in HD the breast abscess opened and drained spontaneously. Because of continued drainage she presents to [facility] for evaluation. Found to be COVID+ taken to OR for further exploration, culture, and excisional debridement 01/15/21    PT Comments    Pt received in bed, fatigued from HD. Pt unwilling to walk, but willing to sit on EOB. Mod I for bed mobility. PT educated pt on importance of position changes even on days where she doesn't feel like moving to prevent pressure ulcers. Integumentary screen showed no areas of concern for pressure ulcers at this time. L breast abscess appears to be healing well, although pt reports it causes minimal discomfort. Educated pt on importance of eating appropriate amount to positively contribute to healing process of abscess and to continue monitoring for any fluid or changes at abscess site. Pt left sitting on EOB with all needs met and call bell within reach. PT explained status of current HD schedule and implications for therapy. Pt frequency dropped to 2x/week to accommodate pt's fatigue related to HD schedule. Pt agreed to plan.   Follow Up Recommendations  No PT follow up;Supervision for mobility/OOB     Equipment Recommendations  None recommended by PT    Recommendations for Other Services       Precautions / Restrictions Precautions Precautions: Other (comment) Precaution Comments: pt is blind, needs assist for navigation, L breast abscess Restrictions Weight Bearing Restrictions: No    Mobility  Bed Mobility Overal bed  mobility: Modified Independent Bed Mobility: Rolling;Supine to Sit Rolling: Modified independent (Device/Increase time)   Supine to sit: Modified independent (Device/Increase time)          Transfers                 General transfer comment: Uable to complete due to fatigue  Ambulation/Gait             General Gait Details: Unable to complete due to fatigue   Stairs             Wheelchair Mobility    Modified Rankin (Stroke Patients Only)       Balance Overall balance assessment: Needs assistance Sitting-balance support: No upper extremity supported;Feet supported Sitting balance-Leahy Scale: Normal     Standing balance support: During functional activity;No upper extremity supported Standing balance-Leahy Scale: Fair                              Cognition Arousal/Alertness: Awake/alert Behavior During Therapy: WFL for tasks assessed/performed Overall Cognitive Status: Within Functional Limits for tasks assessed                                        Exercises      General Comments General comments (skin integrity, edema, etc.): Integumentary screen for pressure ulcers on B feet and back. L breast abscess appears to be healing well.      Pertinent Vitals/Pain Pain Assessment: Faces Faces Pain Scale: Hurts  even more Pain Location: generalized discomfort Pain Descriptors / Indicators: Discomfort Pain Intervention(s): Limited activity within patient's tolerance;Monitored during session    Home Living                      Prior Function            PT Goals (current goals can now be found in the care plan section) Acute Rehab PT Goals Patient Stated Goal: have less pain PT Goal Formulation: With patient Time For Goal Achievement: 02/12/21 Potential to Achieve Goals: Good Additional Goals Additional Goal #1: Pt will be able to navigate her new home environment with supervision Additional Goal #2:  Will score at least 18 on DGI    Frequency    Min 2X/week      PT Plan Frequency needs to be updated    Co-evaluation              AM-PAC PT "6 Clicks" Mobility   Outcome Measure  Help needed turning from your back to your side while in a flat bed without using bedrails?: None Help needed moving from lying on your back to sitting on the side of a flat bed without using bedrails?: None Help needed moving to and from a bed to a chair (including a wheelchair)?: None Help needed standing up from a chair using your arms (e.g., wheelchair or bedside chair)?: None Help needed to walk in hospital room?: A Little Help needed climbing 3-5 steps with a railing? : A Little 6 Click Score: 22    End of Session Equipment Utilized During Treatment: Gait belt Activity Tolerance: Patient limited by fatigue Patient left: with call bell/phone within reach;in bed (sitting at EOB per her request) Nurse Communication: Mobility status PT Visit Diagnosis: Other abnormalities of gait and mobility (R26.89);Pain Pain - part of body:  (L breast and UE)     Time:  -     Charges:                        Rosita Kea, SPT

## 2021-02-05 NOTE — Plan of Care (Signed)
  Problem: Education: Goal: Knowledge of General Education information will improve Description Including pain rating scale, medication(s)/side effects and non-pharmacologic comfort measures Outcome: Progressing   Problem: Health Behavior/Discharge Planning: Goal: Ability to manage health-related needs will improve Outcome: Progressing   

## 2021-02-05 NOTE — TOC Progression Note (Signed)
Transition of Care [facility]) - Progression Note    Patient Details  Name: Natividad Hiemstra MRN: [id number] Date of Birth: Jul 17, 1976  Transition of Care [facility]) CM/SW Contact  Bartholomew Crews, RN Phone Number: [phone] 02/05/2021, 1:21 PM  Clinical Narrative:     Damaris Schooner with Hydia at [facility] to discuss face to face assessment. Hydia has not received clearance for the assessment from leadership at [employer].   Left voicemail for Earlean Polka at [facility] to inquire about potential bed availability.   TOC following for transition needs.  Expected Discharge Plan: [place] Services Barriers to Discharge: Other (comment) (homeless - looking for resorces in [facility])  Expected Discharge Plan and Services Expected Discharge Plan: [place]   Discharge Planning Services: CM Consult Post Acute Care Choice: [place] arrangements for the past 2 months: Single Family Home                 DME Arranged: N/A DME Agency: NA       HH Arranged: RN [place] Agency: [facility] Date HH Agency Contacted: 01/21/21 Time [place]: [phone] Representative spoke with at [place]: [place] ([place]) Interventions    Readmission Risk Interventions No flowsheet data found.

## 2021-02-05 NOTE — Progress Notes (Signed)
Patient ID: Rebekah Burton, female   DOB: 1976-01-05, 45 y.o.   MRN: [id number]  PROGRESS NOTE    Rebekah Burton  [id number] DOB: 06/25/1976 DOA: 01/15/2021 PCP: Trey Sailors, PA   Brief Narrative:  45 year old female with ESRD on HD MWF, blindness, hypertension, PAD came into the hospital with a breast abscess.  General surgery consulted, she underwent incision and drainage.  She was also incidental Covid, now off contact precautions.  She was initially on IV antibiotics which were then transitioned to Augmentin and she has completed course for that. 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  IR and vascular consulted.  aVF now working.  Dialysis per nephrology.  Does not have any place to live.  Needs an address in order to continue outpatient dialysis.  TOC working on finding placement.  Assessment & Plan:   Breast abscess  -General surgery consulted, she is status post I&D done by general surgery.  She was maintained on Unasyn, preliminary cultures show gram-positive, culture finalized with GPC's and mixed anaerobic flora.  She was transitioned to Augmentin and completed course. -No recent temperature spikes and patient is currently hemodynamically stable  Incidental COVID-19 -asymptomatic, no evidence of pulmonary involvement.   -isolation discontinued on 1/25 -Currently on room air  ESRD -nephrology following following.  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  IR and vascular consulted.  aVF now working.  Dialysis per nephrology.  Anemia of chronic kidney disease -monitor, hemoglobin stable  Hyponatremia -Being managed with dialysis and by nephrology  Hypertension -continue clonidine and Coreg  Disposition-patient's relative, where she was living before did not want her to come back due to COVID-19.   She then decided to leave with her boyfriend in [place].  Process was started to move her Medicaid/dialysis from [facility] to [facility] and process was anticipated to be completed on 01/26/2021 however right then, she decided to change her mind and she is not going to live with the boyfriend anymore and at the same time she does not have any other place at this point in time.  TOC is on board and working to find a placement for her.  Obesity -Outpatient follow-up  DVT prophylaxis: Heparin Code Status: Full Family Communication: None Disposition Plan: Status is: Inpatient  Remains inpatient appropriate because:Unsafe d/c plan   Dispo: The patient is from: Home              Anticipated d/c is to: Home              Anticipated d/c date is: 1 day              Patient currently is medically stable to d/c.   Difficult to place patient Yes  Consultants: Nephrology/general surgery  Procedures: I&D of breast abscess  Antimicrobials:  Unasyn and subsequent Augmentin: Completed course  Subjective: Patient seen and examined at bedside undergoing hemodialysis.  Denies any new complaints.  No overnight fever, worsening shortness of breath reported.  Objective: Vitals:   02/05/21 0433 02/05/21 0659 02/05/21 0708 02/05/21 0730  BP: (!) 155/82 (!) 148/97 138/88 127/90  Pulse: 66 65 68 65  Resp:  16    Temp: 97.8 F (36.6 C) 97.8 F (36.6 C)    TempSrc:  Oral    SpO2: 99% 99%    Weight:  104.7 kg  Height:        Intake/Output Summary (Last 24 hours) at 02/05/2021 0757 Last data filed at 02/04/2021 2200 Gross per 24 hour  Intake 1080 ml  Output -  Net 1080 ml   Filed Weights   02/03/21 1512 02/04/21 1900 02/05/21 0659  Weight: 105 kg 106.6 kg 104.7 kg    Examination:  General exam: Chronically ill looking female; looks older than stated age.  No distress.  Currently on room air Respiratory system: Bilateral decreased breath sounds at bases with scattered  rales Cardiovascular system: S1-S2 heard, rate controlled Gastrointestinal system: Abdomen is obese, slightly distended, soft and nontender.  Normal bowel sounds heard  extremities: No cyanosis; trace lower extremity edema.   Data Reviewed: I have personally reviewed following labs and imaging studies  CBC: Recent Labs  Lab 01/30/21 1249 02/01/21 0637 02/03/21 1244  WBC 6.0 5.7 6.2  HGB 8.2* 8.0* 8.4*  HCT 26.4* 26.0* 26.2*  MCV 97.4 97.7 97.0  PLT 253 218 [id number]   Basic Metabolic Panel: Recent Labs  Lab 01/30/21 1249 02/01/21 0636 02/03/21 1244  NA 137 132* 134*  K 4.1 3.8 4.2  CL 93* 94* 94*  CO2 '27 25 26  '$ GLUCOSE 108* 99 115*  BUN 42* 35* 53*  CREATININE 12.76* 11.29* 12.01*  CALCIUM 10.1 9.8 10.4*  PHOS 5.8* 5.8* 5.6*   GFR: Estimated Creatinine Clearance: 7.2 mL/min (A) (by C-G formula based on SCr of 12.01 mg/dL (H)). Liver Function Tests: Recent Labs  Lab 01/30/21 1249 02/01/21 0636 02/03/21 1244  ALBUMIN 2.9* 2.7* 2.9*   No results for input(s): LIPASE, AMYLASE in the last 168 hours. No results for input(s): AMMONIA in the last 168 hours. Coagulation Profile: No results for input(s): INR, PROTIME in the last 168 hours. Cardiac Enzymes: No results for input(s): CKTOTAL, CKMB, CKMBINDEX, TROPONINI in the last 168 hours. BNP (last 3 results) No results for input(s): PROBNP in the last 8760 hours. HbA1C: No results for input(s): HGBA1C in the last 72 hours. CBG: No results for input(s): GLUCAP in the last 168 hours. Lipid Profile: No results for input(s): CHOL, HDL, LDLCALC, TRIG, CHOLHDL, LDLDIRECT in the last 72 hours. Thyroid Function Tests: No results for input(s): TSH, T4TOTAL, FREET4, T3FREE, THYROIDAB in the last 72 hours. Anemia Panel: Recent Labs    02/03/21 1244  TIBC 259  IRON 88   Sepsis Labs: No results for input(s): PROCALCITON, LATICACIDVEN in the last 168 hours.  No results found for this or any previous visit (from the past 240  hour(s)).       Radiology Studies: No results found.      Scheduled Meds: . acetaminophen      . carvedilol  25 mg Oral BID WC  . Chlorhexidine Gluconate Cloth  6 each Topical Q0600  . cloNIDine  0.3 mg Oral TID  . [START ON 02/08/2021] darbepoetin (ARANESP) injection - DIALYSIS  150 mcg Intravenous Q Mon-HD  . heparin  5,000 Units Subcutaneous Q8H  . lidocaine-prilocaine  1 application Topical Q M,W,F-HD  . multivitamin  1 tablet Oral QHS  . senna  1 tablet Oral BID  . sevelamer carbonate  3,200 mg Oral TID WC  . sodium chloride flush  3 mL Intravenous Q12H   Continuous Infusions: . sodium chloride            Aline August, MD [facility] 02/05/2021, 7:57 AM

## 2021-02-05 NOTE — Progress Notes (Signed)
[facility] Progress Note   Subjective:   Seen on dialysis. Tired this AM but no other new concerns. Denies SOB, CP, dizziness and nausea.   Objective Vitals:   02/05/21 0433 02/05/21 0659 02/05/21 0708 02/05/21 0730  BP: (!) 155/82 (!) 148/97 138/88 127/90  Pulse: 66 65 68 65  Resp:  16    Temp: 97.8 F (36.6 C) 97.8 F (36.6 C)    TempSrc:  Oral    SpO2: 99% 99%    Weight:  104.7 kg    Height:       Physical Exam General: Well developed female in NAD Heart: RRR, no murmurs, rubs or gallops Lungs: CTA without wheezing, rhonchi or rales Abdomen: Soft, non-tender, non-distended , +BS Extremities: No edema b/l lower extremities Dialysis Access: LUE AVF + Bruit  Additional Objective Labs: Basic Metabolic Panel: Recent Labs  Lab 01/30/21 1249 02/01/21 0636 02/03/21 1244  NA 137 132* 134*  K 4.1 3.8 4.2  CL 93* 94* 94*  CO2 '27 25 26  '$ GLUCOSE 108* 99 115*  BUN 42* 35* 53*  CREATININE 12.76* 11.29* 12.01*  CALCIUM 10.1 9.8 10.4*  PHOS 5.8* 5.8* 5.6*   Liver Function Tests: Recent Labs  Lab 01/30/21 1249 02/01/21 0636 02/03/21 1244  ALBUMIN 2.9* 2.7* 2.9*   CBC: Recent Labs  Lab 01/30/21 1249 02/01/21 0637 02/03/21 1244  WBC 6.0 5.7 6.2  HGB 8.2* 8.0* 8.4*  HCT 26.4* 26.0* 26.2*  MCV 97.4 97.7 97.0  PLT 253 218 208   Blood Culture    Component Value Date/Time   SDES ABSCESS LEFT BREAST 01/16/2021 0009   SPECREQUEST NONE 01/16/2021 0009   CULT  01/16/2021 0009    MIXED ANAEROBIC FLORA PRESENT.  CALL LAB IF FURTHER IID REQUIRED.   REPTSTATUS 01/21/2021 FINAL 01/16/2021 0009    Iron Studies:  Recent Labs    02/03/21 1244  IRON 88  TIBC 259   Medications: . sodium chloride     . acetaminophen      . carvedilol  25 mg Oral BID WC  . Chlorhexidine Gluconate Cloth  6 each Topical Q0600  . cloNIDine  0.3 mg Oral TID  . [START ON 02/08/2021] darbepoetin (ARANESP) injection - DIALYSIS  150 mcg Intravenous Q Mon-HD  . heparin   5,000 Units Subcutaneous Q8H  . lidocaine-prilocaine  1 application Topical Q M,W,F-HD  . multivitamin  1 tablet Oral QHS  . senna  1 tablet Oral BID  . sevelamer carbonate  3,200 mg Oral TID WC  . sodium chloride flush  3 mL Intravenous Q12H    Dialysis Orders: [place] 4h 44mn 2/2.5 bath 105kg AVF Heparin ? parsabiv 15 mg three times a week with HD mircera 150 mcg every 2 weeks - last given 01/13/21 hectorol 3 mcg three times a week  Assessment/Plan: 1. End-stage renal disease -Tolerating dialysis well. Continue MWF schedule.  2. HD vascular access- history ofcannulationissuesAVF.IR consulted 01/25/21 which revealed significant pseudoaneurysm s/p thrombin injection and near complete thrombosis of pseudoaneurysm per IR. AVF now working well, no issues reported.  3. Left breast abscess- s/p I&D, abx per primary svc 4. covid-19 infection-resolved, off of isolation. 5. HTN-Improved blood pressure control this admission. Does not appear volume overloaded on exam. Continue current medications and UF with HD as tolerated. Likely needs EDW lowered at discharge. 6. Anemia CKD -Hgb 8.4.Aranesp increased to1551m weekly,last given 2/14, iron saturation 34%, ferritin 1976, no indication for Fe supplementation.  7. Metabolic bone [id number] calcium  high, hectorol on hold, use low Ca bath. Not able to get parsabiv here. Continue binders. 8. Social Issues:Awaiting safe d/c plan pending?ALF  Rebekah Paganini, PA-C 02/05/2021, 8:10 AM  [facility] Pager: [phone]

## 2021-02-05 NOTE — Plan of Care (Signed)
?  Problem: Fluid Volume: ?Goal: Compliance with measures to maintain balanced fluid volume will improve ?Outcome: Progressing ?  ?Problem: Clinical Measurements: ?Goal: Complications related to the disease process, condition or treatment will be avoided or minimized ?Outcome: Progressing ?  ?

## 2021-02-06 DIAGNOSIS — N611 Abscess of the breast and nipple: Secondary | ICD-10-CM | POA: Diagnosis not present

## 2021-02-06 DIAGNOSIS — N186 End stage renal disease: Secondary | ICD-10-CM | POA: Diagnosis not present

## 2021-02-06 DIAGNOSIS — U071 COVID-19: Secondary | ICD-10-CM | POA: Diagnosis not present

## 2021-02-06 NOTE — Plan of Care (Signed)
  Problem: Education: Goal: Knowledge of General Education information will improve Description: Including pain rating scale, medication(s)/side effects and non-pharmacologic comfort measures Outcome: Progressing   Problem: Pain Managment: Goal: General experience of comfort will improve Outcome: Progressing   

## 2021-02-06 NOTE — Progress Notes (Signed)
[facility] Progress Note   Subjective:   Reports she has been tired the past two days. No other specific concerns. Denies SOB, CP ,palpitations,dizziness, abdominal pain, nausea, fever/chills. Still awaiting ALF  Objective Vitals:   02/05/21 1013 02/05/21 1740 02/05/21 2051 02/06/21 0614  BP: 122/80 (!) 111/97 (!) 152/100 (!) 145/94  Pulse: 67 66 71 64  Resp: '18 16 18 18  '$ Temp: 97.8 F (36.6 C) (!) 97.4 F (36.3 C) 98.2 F (36.8 C) 98 F (36.7 C)  TempSrc:   Oral Oral  SpO2: 99% 100% 100% 100%  Weight:      Height:       Physical Exam  General: Well developed female, alert and in NAD Heart: RRR, no murmurs, rubs or gallops Lungs: CTA without wheezing, rhonchi or rales Abdomen: Soft,  non-distended , +BS Extremities: No edema b/l lower extremities Dialysis Access: LUE AVF + Bruit  Additional Objective Labs: Basic Metabolic Panel: Recent Labs  Lab 02/01/21 0636 02/03/21 1244 02/05/21 0641  NA 132* 134* 133*  K 3.8 4.2 4.2  CL 94* 94* 97*  CO2 '25 26 23  '$ GLUCOSE 99 115* 98  BUN 35* 53* 49*  CREATININE 11.29* 12.01* 11.52*  CALCIUM 9.8 10.4* 10.3  PHOS 5.8* 5.6* 5.4*   Liver Function Tests: Recent Labs  Lab 02/01/21 0636 02/03/21 1244 02/05/21 0641  ALBUMIN 2.7* 2.9* 3.0*   CBC: Recent Labs  Lab 01/30/21 1249 02/01/21 0637 02/03/21 1244 02/05/21 0641  WBC 6.0 5.7 6.2 6.0  HGB 8.2* 8.0* 8.4* 8.4*  HCT 26.4* 26.0* 26.2* 27.5*  MCV 97.4 97.7 97.0 98.2  PLT 253 218 208 245   Blood Culture    Component Value Date/Time   SDES ABSCESS LEFT BREAST 01/16/2021 0009   SPECREQUEST NONE 01/16/2021 0009   CULT  01/16/2021 0009    MIXED ANAEROBIC FLORA PRESENT.  CALL LAB IF FURTHER IID REQUIRED.   REPTSTATUS 01/21/2021 FINAL 01/16/2021 0009   Iron Studies:  Recent Labs    02/03/21 1244  IRON 88  TIBC 259   Medications: . sodium chloride     . carvedilol  25 mg Oral BID WC  . Chlorhexidine Gluconate Cloth  6 each Topical Q0600  .  cloNIDine  0.3 mg Oral TID  . [START ON 02/08/2021] darbepoetin (ARANESP) injection - DIALYSIS  150 mcg Intravenous Q Mon-HD  . heparin  5,000 Units Subcutaneous Q8H  . lidocaine-prilocaine  1 application Topical Q M,W,F-HD  . multivitamin  1 tablet Oral QHS  . senna  1 tablet Oral BID  . sevelamer carbonate  3,200 mg Oral TID WC  . sodium chloride flush  3 mL Intravenous Q12H    Dialysis Orders: [place] 4h 43mn 2/2.5 bath 105kg AVF Heparin ? parsabiv 15 mg three times a week with HD mircera 150 mcg every 2 weeks - last given 01/13/21 hectorol 3 mcg three times a week  Assessment/Plan: 1. End-stage renal disease -Tolerating dialysis well. Continue MWF schedule.  2. HD vascular access- history ofcannulationissuesAVF.IR consulted 01/25/21 which revealed significant pseudoaneurysm s/p thrombin injection and near complete thrombosis of pseudoaneurysm per IR.AVF now working well, no issues reported.  3. Left breast abscess- s/p I&D, abx per primary svc 4. covid-19 infection-resolved, off of isolation. 5. HTN-Improved blood pressure control this admission. Does not appear volume overloaded on exam. Continue current medications and UF with HD as tolerated. Likely needs EDW lowered at discharge. 6. Anemia CKD -Hgb 8.4.Aranesp increased to1538m weekly,last given 2/14,iron saturation 34%, ferritin  1976, no indication for Fe supplementation.  7. Metabolic bone [id number]. Corrected calcium high, hectorol on hold, use low Ca bath. Not able to get parsabiv here. Continue binders. 8. Social Issues:Awaiting ALF   Anice Paganini, PA-C 02/06/2021, 9:40 AM  [facility] Pager: [phone]

## 2021-02-06 NOTE — Progress Notes (Signed)
Patient ID: Rebekah Burton, female   DOB: [birth year], 45 y.o.   MRN: [id number]  PROGRESS NOTE    Rebekah Burton  [id number] DOB: April 23, 1976 DOA: 01/15/2021 PCP: Trey Sailors, PA   Brief Narrative:  45 year old female with ESRD on HD MWF, blindness, hypertension, PAD came into the hospital with a breast abscess.  General surgery consulted, she underwent incision and drainage.  She was also incidental Covid, now off contact precautions.  She was initially on IV antibiotics which were then transitioned to Augmentin and she has completed course for that. 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  IR and vascular consulted.  aVF now working.  Dialysis per nephrology.  Does not have any place to live.  Needs an address in order to continue outpatient dialysis.  TOC working on finding placement.  Assessment & Plan:   Breast abscess  -General surgery consulted, she is status post I&D done by general surgery.  She was maintained on Unasyn, preliminary cultures show gram-positive, culture finalized with GPC's and mixed anaerobic flora.  She was transitioned to Augmentin and completed course. -No recent temperature spikes and patient is currently hemodynamically stable  Incidental COVID-19 -asymptomatic, no evidence of pulmonary involvement.   -isolation discontinued on 1/25 -Currently on room air  ESRD -nephrology following following.  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  IR and vascular consulted.  aVF now working.  Dialysis per nephrology.  Anemia of chronic kidney disease -monitor, hemoglobin stable  Hyponatremia -Being managed with dialysis and by nephrology  Hypertension -continue clonidine and Coreg.  Blood pressure intermittently on the higher side.  Disposition-patient's relative, where she was living  before did not want her to come back due to COVID-19.  She then decided to leave with her boyfriend in [place].  Process was started to move her Medicaid/dialysis from [facility] to [facility] and process was anticipated to be completed on 01/26/2021 however right then, she decided to change her mind and she is not going to live with the boyfriend anymore and at the same time she does not have any other place at this point in time.  TOC is on board and working to find a placement for her.  Obesity -Outpatient follow-up  DVT prophylaxis: Heparin Code Status: Full Family Communication: None Disposition Plan: Status is: Inpatient  Remains inpatient appropriate because:Unsafe d/c plan   Dispo: The patient is from: Home              Anticipated d/c is to: Home              Anticipated d/c date is: 1 day              Patient currently is medically stable to d/c.   Difficult to place patient Yes  Consultants: Nephrology/general surgery  Procedures: I&D of breast abscess  Antimicrobials:  Unasyn and subsequent Augmentin: Completed course  Subjective: Patient seen and examined at bedside.  Poor historian.  No worsening shortness breath, fever or vomiting reported. Objective: Vitals:   02/05/21 1013 02/05/21 1740 02/05/21 2051 02/06/21 0614  BP: 122/80 (!) 111/97 (!) 152/100 (!) 145/94  Pulse: 67 66 71 64  Resp: '18 16 18 18  '$ Temp: 97.8 F (36.6 C) (!) 97.4 F (36.3 C) 98.2 F (36.8 C) 98 F (36.7 C)  TempSrc:   Oral Oral  SpO2: 99%  100% 100% 100%  Weight:      Height:        Intake/Output Summary (Last 24 hours) at 02/06/2021 0747 Last data filed at 02/05/2021 1700 Gross per 24 hour  Intake 480 ml  Output 2500 ml  Net -2020 ml   Filed Weights   02/03/21 1512 02/04/21 1900 02/05/21 0659  Weight: 105 kg 106.6 kg 104.7 kg    Examination:  General exam: Chronically ill looking female; looks older than stated age.  No acute distress.  On room air currently   respiratory system: Decreased breath sounds at bases bilaterally with some crackles Cardiovascular system: Rate controlled, S1-2 heard  gastrointestinal system: Abdomen is obese, slightly distended, soft and nontender.  Bowel sounds are heard  extremities: Bilateral lower extremity edema present; no clubbing   Data Reviewed: I have personally reviewed following labs and imaging studies  CBC: Recent Labs  Lab 01/30/21 1249 02/01/21 0637 02/03/21 1244 02/05/21 0641  WBC 6.0 5.7 6.2 6.0  HGB 8.2* 8.0* 8.4* 8.4*  HCT 26.4* 26.0* 26.2* 27.5*  MCV 97.4 97.7 97.0 98.2  PLT 253 218 208 [id number]   Basic Metabolic Panel: Recent Labs  Lab 01/30/21 1249 02/01/21 0636 02/03/21 1244 02/05/21 0641  NA 137 132* 134* 133*  K 4.1 3.8 4.2 4.2  CL 93* 94* 94* 97*  CO2 '27 25 26 23  '$ GLUCOSE 108* 99 115* 98  BUN 42* 35* 53* 49*  CREATININE 12.76* 11.29* 12.01* 11.52*  CALCIUM 10.1 9.8 10.4* 10.3  PHOS 5.8* 5.8* 5.6* 5.4*   GFR: Estimated Creatinine Clearance: 7.5 mL/min (A) (by C-G formula based on SCr of 11.52 mg/dL (H)). Liver Function Tests: Recent Labs  Lab 01/30/21 1249 02/01/21 0636 02/03/21 1244 02/05/21 0641  ALBUMIN 2.9* 2.7* 2.9* 3.0*   No results for input(s): LIPASE, AMYLASE in the last 168 hours. No results for input(s): AMMONIA in the last 168 hours. Coagulation Profile: No results for input(s): INR, PROTIME in the last 168 hours. Cardiac Enzymes: No results for input(s): CKTOTAL, CKMB, CKMBINDEX, TROPONINI in the last 168 hours. BNP (last 3 results) No results for input(s): PROBNP in the last 8760 hours. HbA1C: No results for input(s): HGBA1C in the last 72 hours. CBG: No results for input(s): GLUCAP in the last 168 hours. Lipid Profile: No results for input(s): CHOL, HDL, LDLCALC, TRIG, CHOLHDL, LDLDIRECT in the last 72 hours. Thyroid Function Tests: No results for input(s): TSH, T4TOTAL, FREET4, T3FREE, THYROIDAB in the last 72 hours. Anemia Panel: Recent Labs     02/03/21 1244  TIBC 259  IRON 88   Sepsis Labs: No results for input(s): PROCALCITON, LATICACIDVEN in the last 168 hours.  No results found for this or any previous visit (from the past 240 hour(s)).       Radiology Studies: No results found.      Scheduled Meds: . carvedilol  25 mg Oral BID WC  . Chlorhexidine Gluconate Cloth  6 each Topical Q0600  . cloNIDine  0.3 mg Oral TID  . [START ON 02/08/2021] darbepoetin (ARANESP) injection - DIALYSIS  150 mcg Intravenous Q Mon-HD  . heparin  5,000 Units Subcutaneous Q8H  . lidocaine-prilocaine  1 application Topical Q M,W,F-HD  . multivitamin  1 tablet Oral QHS  . senna  1 tablet Oral BID  . sevelamer carbonate  3,200 mg Oral TID WC  . sodium chloride flush  3 mL Intravenous Q12H   Continuous Infusions: . sodium chloride  Aline August, MD [facility] 02/06/2021, 7:47 AM

## 2021-02-06 NOTE — Plan of Care (Signed)
  Problem: Education: Goal: Knowledge of General Education information will improve Description Including pain rating scale, medication(s)/side effects and non-pharmacologic comfort measures Outcome: Progressing   Problem: Health Behavior/Discharge Planning: Goal: Ability to manage health-related needs will improve Outcome: Progressing   Problem: Elimination: Goal: Will not experience complications related to bowel motility Outcome: Progressing Goal: Will not experience complications related to urinary retention Outcome: Progressing   Problem: Pain Managment: Goal: General experience of comfort will improve Outcome: Progressing   Problem: Safety: Goal: Ability to remain free from injury will improve Outcome: Progressing   Problem: Skin Integrity: Goal: Risk for impaired skin integrity will decrease Outcome: Progressing   

## 2021-02-07 DIAGNOSIS — N186 End stage renal disease: Secondary | ICD-10-CM | POA: Diagnosis not present

## 2021-02-07 DIAGNOSIS — U071 COVID-19: Secondary | ICD-10-CM | POA: Diagnosis not present

## 2021-02-07 DIAGNOSIS — N611 Abscess of the breast and nipple: Secondary | ICD-10-CM | POA: Diagnosis not present

## 2021-02-07 MED ORDER — CHLORHEXIDINE GLUCONATE CLOTH 2 % EX PADS
6.0000 | MEDICATED_PAD | Freq: Every day | CUTANEOUS | Status: DC
  Administered 2021-02-07: 6 via TOPICAL

## 2021-02-07 NOTE — Progress Notes (Signed)
[facility] Progress Note   Subjective:   Seen in room, no concerns today.   Objective Vitals:   02/06/21 1149 02/06/21 1700 02/06/21 2148 02/07/21 0539  BP: (!) 139/92 140/90 (!) 154/89 (!) 155/83  Pulse: 66 68 72 65  Resp: '16 16 16 18  '$ Temp: (!) 97.5 F (36.4 C) 98 F (36.7 C) 98.8 F (37.1 C) (!) 97.4 F (36.3 C)  TempSrc: Oral Oral Oral Oral  SpO2: 100% 100% 99% 99%  Weight:      Height:       Physical Exam General:Well developed female, alert and in NAD Heart:RRR, no murmurs, rubs or gallops Lungs:CTA without wheezing, rhonchi or rales Abdomen:Soft,  non-distended , +BS Extremities:No edema b/l lower extremities Dialysis Access:LUE AVF + Bruit  Additional Objective Labs: Basic Metabolic Panel: Recent Labs  Lab 02/01/21 0636 02/03/21 1244 02/05/21 0641  NA 132* 134* 133*  K 3.8 4.2 4.2  CL 94* 94* 97*  CO2 '25 26 23  '$ GLUCOSE 99 115* 98  BUN 35* 53* 49*  CREATININE 11.29* 12.01* 11.52*  CALCIUM 9.8 10.4* 10.3  PHOS 5.8* 5.6* 5.4*   Liver Function Tests: Recent Labs  Lab 02/01/21 0636 02/03/21 1244 02/05/21 0641  ALBUMIN 2.7* 2.9* 3.0*   CBC: Recent Labs  Lab 02/01/21 0637 02/03/21 1244 02/05/21 0641  WBC 5.7 6.2 6.0  HGB 8.0* 8.4* 8.4*  HCT 26.0* 26.2* 27.5*  MCV 97.7 97.0 98.2  PLT 218 208 245   Blood Culture    Component Value Date/Time   SDES ABSCESS LEFT BREAST 01/16/2021 0009   SPECREQUEST NONE 01/16/2021 0009   CULT  01/16/2021 0009    MIXED ANAEROBIC FLORA PRESENT.  CALL LAB IF FURTHER IID REQUIRED.   REPTSTATUS 01/21/2021 FINAL 01/16/2021 0009    Medications: . sodium chloride     . carvedilol  25 mg Oral BID WC  . Chlorhexidine Gluconate Cloth  6 each Topical Q0600  . cloNIDine  0.3 mg Oral TID  . [START ON 02/08/2021] darbepoetin (ARANESP) injection - DIALYSIS  150 mcg Intravenous Q Mon-HD  . heparin  5,000 Units Subcutaneous Q8H  . lidocaine-prilocaine  1 application Topical Q M,W,F-HD  .  multivitamin  1 tablet Oral QHS  . senna  1 tablet Oral BID  . sevelamer carbonate  3,200 mg Oral TID WC  . sodium chloride flush  3 mL Intravenous Q12H    Dialysis Orders: [facility] 4h 25mn 2/2.5 bath 105kg AVF Heparin ? parsabiv 15 mg three times a week with HD mircera 150 mcg every 2 weeks - last given 01/13/21 hectorol 3 mcg three times a week  Assessment/Plan: 1. End-stage renal disease -Tolerating dialysis well. Continue MWF schedule. 2. HD vascular access- history ofcannulationissuesAVF.IR consulted 01/25/21 which revealed significant pseudoaneurysm s/p thrombin injection and near complete thrombosis of pseudoaneurysm per IR.AVF now working well, no issues reported. 3. Left breast abscess- s/p I&D, abx per primary svc 4. covid-19 infection-resolved,off of isolation. 5. HTN-Improved blood pressure control this admission. Does not appear volume overloaded on exam but mild hyponatremia noted. Continue current medications and UF with HD as tolerated. Likely needs EDW lowered at discharge. 6. Anemia CKD -Hgb 8.4.Aranesp increased to1513m weekly,iron saturation 34%, ferritin 1976, no indication for Fe supplementation. 7. Metabolic bone diAB-[id number] calcium high, hectorol on hold, use low Ca bath. Not able to get parsabiv here. Continue binders. 8. Social Issues:Awaiting ALF  SaAnice PaganiniPA-C 02/07/2021, 9:28 AM  [facility] Pager: [phone]

## 2021-02-07 NOTE — Plan of Care (Signed)
  Problem: Education: Goal: Knowledge of General Education information will improve Description Including pain rating scale, medication(s)/side effects and non-pharmacologic comfort measures Outcome: Progressing   

## 2021-02-07 NOTE — Progress Notes (Signed)
Patient ID: Rebekah Burton, female   DOB: 06/02/1976, 45 y.o.   MRN: [id number]  PROGRESS NOTE    Rebekah Burton  [id number] DOB: [birth year] DOA: 01/15/2021 PCP: Trey Sailors, PA   Brief Narrative:  45 year old female with ESRD on HD MWF, blindness, hypertension, PAD came into the hospital with a breast abscess.  General surgery consulted, she underwent incision and drainage.  She was also incidental Covid, now off contact precautions.  She was initially on IV antibiotics which were then transitioned to Augmentin and she has completed course for that. 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  IR and vascular consulted.  aVF now working.  Dialysis per nephrology.  Does not have any place to live.  Needs an address in order to continue outpatient dialysis.  TOC working on finding placement.  Assessment & Plan:   Breast abscess  -General surgery consulted, she is status post I&D done by general surgery.  She was maintained on Unasyn, preliminary cultures show gram-positive, culture finalized with GPC's and mixed anaerobic flora.  She was transitioned to Augmentin and completed course. -No recent temperature spikes and patient is currently hemodynamically stable  Incidental COVID-19 -asymptomatic, no evidence of pulmonary involvement.   -isolation discontinued on 1/25 -Currently on room air  ESRD -nephrology following following.  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  IR and vascular consulted.  aVF now working.  Dialysis per nephrology.  Anemia of chronic kidney disease -monitor, hemoglobin stable  Hyponatremia -Being managed with dialysis and by nephrology  Hypertension -continue clonidine and Coreg.  Blood pressure intermittently on the higher side.  Disposition-patient's relative, where she was living  before did not want her to come back due to COVID-19.  She then decided to leave with her boyfriend in [place].  Process was started to move her Medicaid/dialysis from [facility] to [facility] and process was anticipated to be completed on 01/26/2021 however right then, she decided to change her mind and she is not going to live with the boyfriend anymore and at the same time she does not have any other place at this point in time.  TOC is on board and working to find a placement for her.  Obesity -Outpatient follow-up  DVT prophylaxis: Heparin Code Status: Full Family Communication: None Disposition Plan: Status is: Inpatient  Remains inpatient appropriate because:Unsafe d/c plan   Dispo: The patient is from: Home              Anticipated d/c is to: Home              Anticipated d/c date is: 1 day              Patient currently is medically stable to d/c.   Difficult to place patient Yes  Consultants: Nephrology/general surgery  Procedures: I&D of breast abscess  Antimicrobials:  Unasyn and subsequent Augmentin: Completed course  Subjective: Patient seen and examined at bedside.  Patient has no new complaints.  No overnight fever, vomiting reported  objective: Vitals:   02/06/21 1149 02/06/21 1700 02/06/21 2148 02/07/21 0539  BP: (!) 139/92 140/90 (!) 154/89 (!) 155/83  Pulse: 66 68 72 65  Resp: '16 16 16 18  '$ Temp: (!) 97.5 F (36.4 C) 98 F (36.7 C) 98.8 F (37.1 C) (!) 97.4 F (36.3 C)  TempSrc: Oral Oral Oral Oral  SpO2: 100% 100% 99% 99%  Weight:      Height:        Intake/Output Summary (Last 24 hours) at 02/07/2021 0755 Last data filed at 02/06/2021 1700 Gross per 24 hour  Intake 960 ml  Output -  Net 960 ml   Filed Weights   02/03/21 1512 02/04/21 1900 02/05/21 0659  Weight: 105 kg 106.6 kg 104.7 kg    Examination:  General exam: Chronically ill looking female; looks older than stated age.  Still on room air.  No distress.   Respiratory  system: Bilateral decreased breath sounds at bases with some crackles cardiovascular system: S1-S2 heard, rate controlled  gastrointestinal system: Abdomen is obese, mildly distended, soft and nontender.  Normal bowel sounds heard  extremities: No cyanosis; trace lower extremity edema present   Data Reviewed: I have personally reviewed following labs and imaging studies  CBC: Recent Labs  Lab 02/01/21 0637 02/03/21 1244 02/05/21 0641  WBC 5.7 6.2 6.0  HGB 8.0* 8.4* 8.4*  HCT 26.0* 26.2* 27.5*  MCV 97.7 97.0 98.2  PLT 218 208 99991111   Basic Metabolic Panel: Recent Labs  Lab 02/01/21 0636 02/03/21 1244 02/05/21 0641  NA 132* 134* 133*  K 3.8 4.2 4.2  CL 94* 94* 97*  CO2 '25 26 23  '$ GLUCOSE 99 115* 98  BUN 35* 53* 49*  CREATININE 11.29* 12.01* 11.52*  CALCIUM 9.8 10.4* 10.3  PHOS 5.8* 5.6* 5.4*   GFR: Estimated Creatinine Clearance: 7.5 mL/min (A) (by C-G formula based on SCr of 11.52 mg/dL (H)). Liver Function Tests: Recent Labs  Lab 02/01/21 0636 02/03/21 1244 02/05/21 0641  ALBUMIN 2.7* 2.9* 3.0*   No results for input(s): LIPASE, AMYLASE in the last 168 hours. No results for input(s): AMMONIA in the last 168 hours. Coagulation Profile: No results for input(s): INR, PROTIME in the last 168 hours. Cardiac Enzymes: No results for input(s): CKTOTAL, CKMB, CKMBINDEX, TROPONINI in the last 168 hours. BNP (last 3 results) No results for input(s): PROBNP in the last 8760 hours. HbA1C: No results for input(s): HGBA1C in the last 72 hours. CBG: No results for input(s): GLUCAP in the last 168 hours. Lipid Profile: No results for input(s): CHOL, HDL, LDLCALC, TRIG, CHOLHDL, LDLDIRECT in the last 72 hours. Thyroid Function Tests: No results for input(s): TSH, T4TOTAL, FREET4, T3FREE, THYROIDAB in the last 72 hours. Anemia Panel: No results for input(s): VITAMINB12, FOLATE, FERRITIN, TIBC, IRON, RETICCTPCT in the last 72 hours. Sepsis Labs: No results for input(s):  PROCALCITON, LATICACIDVEN in the last 168 hours.  No results found for this or any previous visit (from the past 240 hour(s)).       Radiology Studies: No results found.      Scheduled Meds: . carvedilol  25 mg Oral BID WC  . Chlorhexidine Gluconate Cloth  6 each Topical Q0600  . cloNIDine  0.3 mg Oral TID  . [START ON 02/08/2021] darbepoetin (ARANESP) injection - DIALYSIS  150 mcg Intravenous Q Mon-HD  . heparin  5,000 Units Subcutaneous Q8H  . lidocaine-prilocaine  1 application Topical Q M,W,F-HD  . multivitamin  1 tablet Oral QHS  . senna  1 tablet Oral BID  . sevelamer carbonate  3,200 mg Oral TID WC  . sodium chloride flush  3 mL Intravenous Q12H   Continuous Infusions: . sodium chloride            Aline August, MD [facility] 02/07/2021, 7:55 AM

## 2021-02-08 DIAGNOSIS — N186 End stage renal disease: Secondary | ICD-10-CM | POA: Diagnosis not present

## 2021-02-08 DIAGNOSIS — N611 Abscess of the breast and nipple: Secondary | ICD-10-CM | POA: Diagnosis not present

## 2021-02-08 DIAGNOSIS — U071 COVID-19: Secondary | ICD-10-CM | POA: Diagnosis not present

## 2021-02-08 LAB — RENAL FUNCTION PANEL
Albumin: 3 g/dL — ABNORMAL LOW (ref 3.5–5.0)
Anion gap: 13 (ref 5–15)
BUN: 72 mg/dL — ABNORMAL HIGH (ref 6–20)
CO2: 23 mmol/L (ref 22–32)
Calcium: 10 mg/dL (ref 8.9–10.3)
Chloride: 93 mmol/L — ABNORMAL LOW (ref 98–111)
Creatinine, Ser: 13.09 mg/dL — ABNORMAL HIGH (ref 0.44–1.00)
GFR, Estimated: 3 mL/min — ABNORMAL LOW (ref >60)
Glucose, Bld: 108 mg/dL — ABNORMAL HIGH (ref 70–99)
Phosphorus: 6.3 mg/dL — ABNORMAL HIGH (ref 2.5–4.6)
Potassium: 4.3 mmol/L (ref 3.5–5.1)
Sodium: 129 mmol/L — ABNORMAL LOW (ref 135–145)

## 2021-02-08 LAB — CBC
HCT: 27.1 % — ABNORMAL LOW (ref 36.0–46.0)
Hemoglobin: 8.4 g/dL — ABNORMAL LOW (ref 12.0–15.0)
MCH: 29.9 pg (ref 26.0–34.0)
MCHC: 31 g/dL (ref 30.0–36.0)
MCV: 96.4 fL (ref 80.0–100.0)
Platelets: 219 10*3/uL (ref 150–400)
RBC: 2.81 MIL/uL — ABNORMAL LOW (ref 3.87–5.11)
RDW: 17.5 % — ABNORMAL HIGH (ref 11.5–15.5)
WBC: 5.7 10*3/uL (ref 4.0–10.5)
nRBC: 0 % (ref 0.0–0.2)

## 2021-02-08 MED ORDER — DARBEPOETIN ALFA 150 MCG/0.3ML IJ SOSY
PREFILLED_SYRINGE | INTRAMUSCULAR | Status: AC
  Filled 2021-02-08: qty 0.3

## 2021-02-08 MED ORDER — ACETAMINOPHEN 325 MG PO TABS
ORAL_TABLET | ORAL | Status: AC
  Filled 2021-02-08: qty 2

## 2021-02-08 NOTE — Progress Notes (Signed)
Patient ID: Rebekah Burton, female   DOB: [birth year], 45 y.o.   MRN: [id number]  PROGRESS NOTE    Rebekah Burton  [id number] DOB: 02-Feb-1976 DOA: 01/15/2021 PCP: Trey Sailors, PA   Brief Narrative:  45 year old female with ESRD on HD MWF, blindness, hypertension, PAD came into the hospital with a breast abscess.  General surgery consulted, she underwent incision and drainage.  She was also incidental Covid, now off contact precautions.  She was initially on IV antibiotics which were then transitioned to Augmentin and she has completed course for that. 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  IR and vascular consulted.  aVF now working.  Dialysis per nephrology.  Does not have any place to live.  Needs an address in order to continue outpatient dialysis.  TOC working on finding placement.  Assessment & Plan:   Breast abscess  -General surgery consulted, she is status post I&D done by general surgery.  She was maintained on Unasyn, preliminary cultures show gram-positive, culture finalized with GPC's and mixed anaerobic flora.  She was transitioned to Augmentin and completed course. -No recent temperature spikes and patient is currently hemodynamically stable  Incidental COVID-19 -asymptomatic, no evidence of pulmonary involvement.   -isolation discontinued on 1/25 -Currently on room air  ESRD -nephrology following following.  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  IR and vascular consulted.  aVF now working.  Dialysis per nephrology.  Anemia of chronic kidney disease -monitor intermittently, hemoglobin stable  Hyponatremia -Being managed with dialysis and by nephrology  Hypertension -continue clonidine and Coreg.  Blood pressure intermittently on the higher side.  Disposition-patient's relative, where she  was living before did not want her to come back due to COVID-19.  She then decided to leave with her boyfriend in [place].  Process was started to move her Medicaid/dialysis from [facility] to [facility] and process was anticipated to be completed on 01/26/2021 however right then, she decided to change her mind and she is not going to live with the boyfriend anymore and at the same time she does not have any other place at this point in time.  TOC is on board and working to find a placement for her.  Obesity -Outpatient follow-up  DVT prophylaxis: Heparin Code Status: Full Family Communication: None Disposition Plan: Status is: Inpatient  Remains inpatient appropriate because:Unsafe d/c plan   Dispo: The patient is from: Home              Anticipated d/c is to: Home              Anticipated d/c date is: 1 day              Patient currently is medically stable to d/c.   Difficult to place patient Yes  Consultants: Nephrology/general surgery  Procedures: I&D of breast abscess  Antimicrobials:  Unasyn and subsequent Augmentin: Completed course  Subjective: Patient seen and examined at bedside undergoing hemodialysis.  No new complaints.  No overnight worsening shortness breath, vomiting, fever or chest pain reported  objective: Vitals:   02/07/21 1100 02/07/21 1735 02/07/21 2117 02/08/21 0652  BP: (!) 150/85 (!) 145/82 (!) 158/76 (!) 142/88  Pulse: 70 70 67 64  Resp: '18 18 18   '$ Temp: 98.8 F (37.1 C) 98.7 F (37.1 C)  99 F (37.2 C)  TempSrc: Oral Oral  SpO2: 99% 99% 100% 99%  Weight:   106.9 kg   Height:        Intake/Output Summary (Last 24 hours) at 02/08/2021 0738 Last data filed at 02/08/2021 0500 Gross per 24 hour  Intake 840 ml  Output 4 ml  Net 836 ml   Filed Weights   02/04/21 1900 02/05/21 0659 02/07/21 2117  Weight: 106.6 kg 104.7 kg 106.9 kg    Examination:  General exam: Chronically ill looking female; looks older than stated age.  No  acute distress.  Still on room air.   Respiratory system: Decreased breath sounds at bases bilaterally  cardiovascular system: Rate controlled, S1-S2 heard  gastrointestinal system: Abdomen is obese, mildly distended, soft and nontender.  Bowel sounds are heard  extremities: Mild lower extremity edema present; no clubbing  Data Reviewed: I have personally reviewed following labs and imaging studies  CBC: Recent Labs  Lab 02/03/21 1244 02/05/21 0641  WBC 6.2 6.0  HGB 8.4* 8.4*  HCT 26.2* 27.5*  MCV 97.0 98.2  PLT 208 [id number]   Basic Metabolic Panel: Recent Labs  Lab 02/03/21 1244 02/05/21 0641  NA 134* 133*  K 4.2 4.2  CL 94* 97*  CO2 26 23  GLUCOSE 115* 98  BUN 53* 49*  CREATININE 12.01* 11.52*  CALCIUM 10.4* 10.3  PHOS 5.6* 5.4*   GFR: Estimated Creatinine Clearance: 7.6 mL/min (A) (by C-G formula based on SCr of 11.52 mg/dL (H)). Liver Function Tests: Recent Labs  Lab 02/03/21 1244 02/05/21 0641  ALBUMIN 2.9* 3.0*   No results for input(s): LIPASE, AMYLASE in the last 168 hours. No results for input(s): AMMONIA in the last 168 hours. Coagulation Profile: No results for input(s): INR, PROTIME in the last 168 hours. Cardiac Enzymes: No results for input(s): CKTOTAL, CKMB, CKMBINDEX, TROPONINI in the last 168 hours. BNP (last 3 results) No results for input(s): PROBNP in the last 8760 hours. HbA1C: No results for input(s): HGBA1C in the last 72 hours. CBG: No results for input(s): GLUCAP in the last 168 hours. Lipid Profile: No results for input(s): CHOL, HDL, LDLCALC, TRIG, CHOLHDL, LDLDIRECT in the last 72 hours. Thyroid Function Tests: No results for input(s): TSH, T4TOTAL, FREET4, T3FREE, THYROIDAB in the last 72 hours. Anemia Panel: No results for input(s): VITAMINB12, FOLATE, FERRITIN, TIBC, IRON, RETICCTPCT in the last 72 hours. Sepsis Labs: No results for input(s): PROCALCITON, LATICACIDVEN in the last 168 hours.  No results found for this or any  previous visit (from the past 240 hour(s)).       Radiology Studies: No results found.      Scheduled Meds: . carvedilol  25 mg Oral BID WC  . Chlorhexidine Gluconate Cloth  6 each Topical Q0600  . cloNIDine  0.3 mg Oral TID  . darbepoetin (ARANESP) injection - DIALYSIS  150 mcg Intravenous Q Mon-HD  . heparin  5,000 Units Subcutaneous Q8H  . lidocaine-prilocaine  1 application Topical Q M,W,F-HD  . multivitamin  1 tablet Oral QHS  . senna  1 tablet Oral BID  . sevelamer carbonate  3,200 mg Oral TID WC  . sodium chloride flush  3 mL Intravenous Q12H   Continuous Infusions: . sodium chloride            Aline August, MD [facility] 02/08/2021, 7:38 AM

## 2021-02-08 NOTE — Procedures (Signed)
Patient seen on Hemodialysis- had some problems earlier with cannulation of AVF. BP 92/64 (BP Location: Right Arm)   Pulse (!) 58   Temp 97.9 F (36.6 C) (Oral)   Resp 18   Ht '5\' 4"'$  (1.626 m)   Wt 104.5 kg   SpO2 99%   BMI 39.54 kg/m   QB 400, UF goal 3L Tolerating treatment without complaints at this time.  Elmarie Shiley MD [facility]. Office # [phone] Pager # [phone] 9:03 AM

## 2021-02-08 NOTE — Plan of Care (Signed)
  Problem: Pain Managment: Goal: General experience of comfort will improve Outcome: Progressing   Problem: Fluid Volume: Goal: Compliance with measures to maintain balanced fluid volume will improve Outcome: Progressing   

## 2021-02-08 NOTE — TOC Progression Note (Signed)
Transition of Care [facility]) - Progression Note    Patient Details  Name: Kristyne Kindschi MRN: [id number] Date of Birth: 1976/04/24  Transition of Care [facility] CM/SW Contact  Bartholomew Crews, RN Phone Number: [phone] 02/08/2021, 3:03 PM  Clinical Narrative:     Damaris Schooner with [facility] at [facility] late Friday 2/11. No bed offer at this time.   Spoke with Aaron Edelman from [facility] this afternoon to discuss follow up with [place]. Contact information provided for follow up.   Spoke with patient at the bedside to discuss ongoing search. Patient understanding.   TOC following for transition needs.   Expected Discharge Plan: [facility] Barriers to Discharge: Other (comment) (homeless - looking for resorces in [facility])  Expected Discharge Plan and Services Expected Discharge Plan: [place]   Discharge Planning Services: CM Consult Post Acute Care Choice: [place] arrangements for the past 2 months: Single Family Home                 DME Arranged: N/A DME Agency: NA       HH Arranged: RN [place] Agency: [facility] Date HH Agency Contacted: 01/21/21 Time Man: [id number] Representative spoke with at [place]: [place] ([place]) Interventions    Readmission Risk Interventions No flowsheet data found.

## 2021-02-09 DIAGNOSIS — U071 COVID-19: Secondary | ICD-10-CM | POA: Diagnosis not present

## 2021-02-09 DIAGNOSIS — N611 Abscess of the breast and nipple: Secondary | ICD-10-CM | POA: Diagnosis not present

## 2021-02-09 DIAGNOSIS — N186 End stage renal disease: Secondary | ICD-10-CM | POA: Diagnosis not present

## 2021-02-09 MED ORDER — CHLORHEXIDINE GLUCONATE CLOTH 2 % EX PADS
6.0000 | MEDICATED_PAD | Freq: Every day | CUTANEOUS | Status: DC
  Administered 2021-02-09: 6 via TOPICAL

## 2021-02-09 NOTE — Progress Notes (Signed)
[facility] Progress Note   Subjective:  Patient seen and examined at bedside.  No new complaints.  Tolerated dialysis well yesterday.  Denies CP, SOB, edema, and n/v/d.   Objective Vitals:   02/08/21 1112 02/08/21 1741 02/08/21 2059 02/09/21 0544  BP: 138/83 (!) 154/83 135/75 (!) 148/100  Pulse: 63 68 67 70  Resp: '18 16 18 16  '$ Temp: (!) 97.5 F (36.4 C) 98.7 F (37.1 C) 97.9 F (36.6 C)   TempSrc: Oral Oral Oral   SpO2: 100% 98% 100% 99%  Weight:      Height:       Physical Exam General:well appearing female in NAD Heart:RRR Lungs:CTAB, nml WOB in RA Abdomen:soft, NTND Extremities:no LE edema Dialysis Access: LU AVF +b/t   Filed Weights   02/07/21 2117 02/08/21 0725 02/08/21 1033  Weight: 106.9 kg 104.5 kg 101.5 kg    Intake/Output Summary (Last 24 hours) at 02/09/2021 1007 Last data filed at 02/09/2021 0900 Gross per 24 hour  Intake 957 ml  Output 3000 ml  Net -2043 ml    Additional Objective Labs: Basic Metabolic Panel: Recent Labs  Lab 02/03/21 1244 02/05/21 0641 02/08/21 0649  NA 134* 133* 129*  K 4.2 4.2 4.3  CL 94* 97* 93*  CO2 '26 23 23  '$ GLUCOSE 115* 98 108*  BUN 53* 49* 72*  CREATININE 12.01* 11.52* 13.09*  CALCIUM 10.4* 10.3 10.0  PHOS 5.6* 5.4* 6.3*   Liver Function Tests: Recent Labs  Lab 02/03/21 1244 02/05/21 0641 02/08/21 0649  ALBUMIN 2.9* 3.0* 3.0*   CBC: Recent Labs  Lab 02/03/21 1244 02/05/21 0641 02/08/21 0649  WBC 6.2 6.0 5.7  HGB 8.4* 8.4* 8.4*  HCT 26.2* 27.5* 27.1*  MCV 97.0 98.2 96.4  PLT 208 245 219    Medications: . sodium chloride     . carvedilol  25 mg Oral BID WC  . Chlorhexidine Gluconate Cloth  6 each Topical Q0600  . cloNIDine  0.3 mg Oral TID  . darbepoetin (ARANESP) injection - DIALYSIS  150 mcg Intravenous Q Mon-HD  . heparin  5,000 Units Subcutaneous Q8H  . lidocaine-prilocaine  1 application Topical Q M,W,F-HD  . multivitamin  1 tablet Oral QHS  . senna  1 tablet Oral BID  .  sevelamer carbonate  3,200 mg Oral TID WC  . sodium chloride flush  3 mL Intravenous Q12H    Dialysis Orders: [place] 4h 38mn 2/2.5 bath 105kg AVF Heparin ? parsabiv 15 mg three times a week with HD mircera 150 mcg every 2 weeks - last given 01/13/21 hectorol 3 mcg three times a week  Assessment/Plan: 1. End-stage renal disease - on MWF. Tolerating dialysis well. Continue regular schedule with HD tomorrow.  2. 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 3. Left breast abscess- s/p I&D, abx per primary svc 4. covid-19 infection-resolved,off of isolation. 5. HTN- BP variable but overall improved control this admission. Does not appear volume overloaded on exam. Continue current medications and UF with HD as tolerated. Likely needs EDW lowered at discharge.  Last weight 101.5kg.  6. Anemia CKD - Last Hgb 8.4.Aranesp increased to1518m weekly,iron saturation 34%, ferritin 1976, no indication for Fe supplementation. 7. Metabolic bone disease- Phosup, continue binders, may need to increase if continues to run high.  Corrected calcium high, hectorol on hold, use low Ca bath. Not able to  get parsabiv here.  8. Social Issues:AwaitingALF.  SW working with her.     Jen Mow, PA-C [facility] 02/09/2021,10:07 AM  LOS: 25 days

## 2021-02-09 NOTE — Progress Notes (Signed)
Physical Therapy Treatment Patient Details Name: Rebekah Burton MRN: [id number] DOB: [birth year] Today's Date: 02/09/2021    History of Present Illness 45 y.o. female with medical history significant of ESRD-HD M-W-F, blindness, HTN, PAD had developed a breast abscess and was seen in ED 01/04/21 where U/S confirmed abscess. She was to have been seen as outpatient for aspiration/drainage but did not get to make that appt. Today in HD the breast abscess opened and drained spontaneously. Because of continued drainage she presents to [facility] for evaluation. Found to be COVID+ taken to OR for further exploration, culture, and excisional debridement 01/15/21    PT Comments    Pt received in bed, pleasant and cooperative. Pt was supervision for ambulation total 100 ft, including 50 ft ambulating backwards. Decreased gait speed walking backwards but no overt LOB. Pt unable to hold single leg static standing for > 5 seconds on either LE. Able to hold semi-tandem stance for 30 seconds on each side with increased sway. More difficulty on R side. Pt left in chair with all needs met and call bell within reach.     Follow Up Recommendations  No PT follow up;Supervision for mobility/OOB     Equipment Recommendations  None recommended by PT    Recommendations for Other Services       Precautions / Restrictions Precautions Precautions: Other (comment) Precaution Comments: pt is blind, needs assist for navigation, L breast abscess Restrictions Weight Bearing Restrictions: No    Mobility  Bed Mobility Overal bed mobility: Modified Independent Bed Mobility: Supine to Sit     Supine to sit: Modified independent (Device/Increase time)          Transfers Overall transfer level: Modified independent Equipment used: None                Ambulation/Gait Ambulation/Gait assistance: Supervision Gait Distance (Feet): 100 Feet (50 ft fwd, 50 ft bkwd) Assistive device: None Gait Pattern/deviations:  Step-through pattern;Decreased step length - right;Decreased step length - left;Drifts right/left Gait velocity: decreased   General Gait Details: Cueing for navigation, decreased speed walking backwards but no overt LOB or sway   Stairs             Wheelchair Mobility    Modified Rankin (Stroke Patients Only)       Balance Overall balance assessment: Needs assistance Sitting-balance support: No upper extremity supported;Feet supported Sitting balance-Leahy Scale: Normal     Standing balance support: During functional activity;No upper extremity supported Standing balance-Leahy Scale: Fair                              Cognition Arousal/Alertness: Awake/alert Behavior During Therapy: WFL for tasks assessed/performed Overall Cognitive Status: Within Functional Limits for tasks assessed                                 General Comments: very pleasant and cooperative      Exercises Other Exercises Other Exercises: Single leg stance ~5 seconds each side, Semi tandem stance 30 sec on each side (Unable to hold single leg stance > 5 seconds, increased sway with semi tandem, more difficulty with R LE behind during semi tandem stance)    General Comments        Pertinent Vitals/Pain Pain Assessment: Faces Faces Pain Scale: No hurt    Home Living  Prior Function            PT Goals (current goals can now be found in the care plan section) Acute Rehab PT Goals Patient Stated Goal: have less pain PT Goal Formulation: With patient Time For Goal Achievement: 02/12/21 Potential to Achieve Goals: Good Additional Goals Additional Goal #1: Pt will be able to navigate her new home environment with supervision Additional Goal #2: Will score at least 18 on DGI Progress towards PT goals: Progressing toward goals    Frequency    Min 2X/week      PT Plan Current plan remains appropriate    Co-evaluation               AM-PAC PT "6 Clicks" Mobility   Outcome Measure  Help needed turning from your back to your side while in a flat bed without using bedrails?: None Help needed moving from lying on your back to sitting on the side of a flat bed without using bedrails?: None Help needed moving to and from a bed to a chair (including a wheelchair)?: None Help needed standing up from a chair using your arms (e.g., wheelchair or bedside chair)?: None Help needed to walk in hospital room?: A Little Help needed climbing 3-5 steps with a railing? : A Little 6 Click Score: 22    End of Session Equipment Utilized During Treatment: Gait belt Activity Tolerance: Patient tolerated treatment well Patient left: with call bell/phone within reach;in chair (sitting at EOB per her request) Nurse Communication: Mobility status PT Visit Diagnosis: Other abnormalities of gait and mobility (R26.89);Pain Pain - part of body:  (L breast and UE)     Time:  -     Charges:                        Rosita Kea, SPT

## 2021-02-09 NOTE — Plan of Care (Signed)
  Problem: Education: Goal: Knowledge of General Education information will improve Description: Including pain rating scale, medication(s)/side effects and non-pharmacologic comfort measures Outcome: Completed/Met   Problem: Elimination: Goal: Will not experience complications related to bowel motility Outcome: Completed/Met Goal: Will not experience complications related to urinary retention Outcome: Completed/Met

## 2021-02-09 NOTE — Progress Notes (Signed)
Patient ID: Rebekah Burton, female   DOB: 09-23-1976, 45 y.o.   MRN: [id number]  PROGRESS NOTE    Rebekah Burton  [id number] DOB: [birth year] DOA: 01/15/2021 PCP: Trey Sailors, PA   Brief Narrative:  45 year old female with ESRD on HD MWF, blindness, hypertension, PAD came into the hospital with a breast abscess.  General surgery consulted, she underwent incision and drainage.  She was also incidental Covid, now off contact precautions.  She was initially on IV antibiotics which were then transitioned to Augmentin and she has completed course for that. 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  IR and vascular consulted.  aVF now working.  Dialysis per nephrology.  Does not have any place to live.  Needs an address in order to continue outpatient dialysis.  TOC working on finding placement.  Assessment & Plan:   Breast abscess  -General surgery consulted, she is status post I&D done by general surgery.  She was maintained on Unasyn, preliminary cultures show gram-positive, culture finalized with GPC's and mixed anaerobic flora.  She was transitioned to Augmentin and completed course. -No recent temperature spikes and patient is currently hemodynamically stable  Incidental COVID-19 -asymptomatic, no evidence of pulmonary involvement.   -isolation discontinued on 1/25 -Currently on room air  ESRD -nephrology following following.  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  IR and vascular consulted.  aVF now working.  Dialysis per nephrology.  Anemia of chronic kidney disease -monitor intermittently, hemoglobin stable  Hyponatremia -Being managed with dialysis and by nephrology  Hypertension -continue clonidine and Coreg.  Blood pressure intermittently on the higher side.  Disposition-patient's relative, where she  was living before did not want her to come back due to COVID-19.  She then decided to leave with her boyfriend in [place].  Process was started to move her Medicaid/dialysis from [facility] to [facility] and process was anticipated to be completed on 01/26/2021 however right then, she decided to change her mind and she is not going to live with the boyfriend anymore and at the same time she does not have any other place at this point in time.  TOC is on board and working to find a placement for her.  Obesity -Outpatient follow-up  DVT prophylaxis: Heparin Code Status: Full Family Communication: None Disposition Plan: Status is: Inpatient  Remains inpatient appropriate because:Unsafe d/c plan   Dispo: The patient is from: Home              Anticipated d/c is to: Home              Anticipated d/c date is: 1 day              Patient currently is medically stable to d/c.   Difficult to place patient Yes  Consultants: Nephrology/general surgery  Procedures: I&D of breast abscess  Antimicrobials:  Unasyn and subsequent Augmentin: Completed course  Subjective: Patient seen and examined at bedside.  Patient has no new complaints.  No overnight worsening shortness breath, vomiting or chest pain reported.  objective: Vitals:   02/08/21 1112 02/08/21 1741 02/08/21 2059 02/09/21 0544  BP: 138/83 (!) 154/83 135/75 (!) 148/100  Pulse: 63 68 67 70  Resp: '18 16 18 16  '$ Temp: (!) 97.5 F (36.4 C) 98.7 F (37.1 C) 97.9 F (36.6 C)   TempSrc: Oral Oral Oral  SpO2: 100% 98% 100% 99%  Weight:      Height:        Intake/Output Summary (Last 24 hours) at 02/09/2021 0746 Last data filed at 02/09/2021 0600 Gross per 24 hour  Intake 837 ml  Output 3000 ml  Net -2163 ml   Filed Weights   02/07/21 2117 02/08/21 0725 02/08/21 1033  Weight: 106.9 kg 104.5 kg 101.5 kg    Examination:  General exam: Chronically ill looking female; looks older than stated age.  Currently on  room air with no distress.   Respiratory system: Bilateral decreased breath sounds at bases with some scattered crackles  cardiovascular system: S1-S2 heard, rate controlled gastrointestinal system: Abdomen is obese, mildly distended, soft and nontender.  Bowel sounds heard  extremities: No cyanosis; lower extremity edema present bilaterally  Data Reviewed: I have personally reviewed following labs and imaging studies  CBC: Recent Labs  Lab 02/03/21 1244 02/05/21 0641 02/08/21 0649  WBC 6.2 6.0 5.7  HGB 8.4* 8.4* 8.4*  HCT 26.2* 27.5* 27.1*  MCV 97.0 98.2 96.4  PLT 208 245 [id number]   Basic Metabolic Panel: Recent Labs  Lab 02/03/21 1244 02/05/21 0641 02/08/21 0649  NA 134* 133* 129*  K 4.2 4.2 4.3  CL 94* 97* 93*  CO2 '26 23 23  '$ GLUCOSE 115* 98 108*  BUN 53* 49* 72*  CREATININE 12.01* 11.52* 13.09*  CALCIUM 10.4* 10.3 10.0  PHOS 5.6* 5.4* 6.3*   GFR: Estimated Creatinine Clearance: 6.5 mL/min (A) (by C-G formula based on SCr of 13.09 mg/dL (H)). Liver Function Tests: Recent Labs  Lab 02/03/21 1244 02/05/21 0641 02/08/21 0649  ALBUMIN 2.9* 3.0* 3.0*   No results for input(s): LIPASE, AMYLASE in the last 168 hours. No results for input(s): AMMONIA in the last 168 hours. Coagulation Profile: No results for input(s): INR, PROTIME in the last 168 hours. Cardiac Enzymes: No results for input(s): CKTOTAL, CKMB, CKMBINDEX, TROPONINI in the last 168 hours. BNP (last 3 results) No results for input(s): PROBNP in the last 8760 hours. HbA1C: No results for input(s): HGBA1C in the last 72 hours. CBG: No results for input(s): GLUCAP in the last 168 hours. Lipid Profile: No results for input(s): CHOL, HDL, LDLCALC, TRIG, CHOLHDL, LDLDIRECT in the last 72 hours. Thyroid Function Tests: No results for input(s): TSH, T4TOTAL, FREET4, T3FREE, THYROIDAB in the last 72 hours. Anemia Panel: No results for input(s): VITAMINB12, FOLATE, FERRITIN, TIBC, IRON, RETICCTPCT in the last 72  hours. Sepsis Labs: No results for input(s): PROCALCITON, LATICACIDVEN in the last 168 hours.  No results found for this or any previous visit (from the past 240 hour(s)).       Radiology Studies: No results found.      Scheduled Meds: . carvedilol  25 mg Oral BID WC  . Chlorhexidine Gluconate Cloth  6 each Topical Q0600  . cloNIDine  0.3 mg Oral TID  . darbepoetin (ARANESP) injection - DIALYSIS  150 mcg Intravenous Q Mon-HD  . heparin  5,000 Units Subcutaneous Q8H  . lidocaine-prilocaine  1 application Topical Q M,W,F-HD  . multivitamin  1 tablet Oral QHS  . senna  1 tablet Oral BID  . sevelamer carbonate  3,200 mg Oral TID WC  . sodium chloride flush  3 mL Intravenous Q12H   Continuous Infusions: . sodium chloride            Aline August, MD [facility] 02/09/2021, 7:46 AM

## 2021-02-09 NOTE — Plan of Care (Signed)
  Problem: Pain Managment: Goal: General experience of comfort will improve Outcome: Progressing   

## 2021-02-10 DIAGNOSIS — N611 Abscess of the breast and nipple: Secondary | ICD-10-CM | POA: Diagnosis not present

## 2021-02-10 DIAGNOSIS — N186 End stage renal disease: Secondary | ICD-10-CM | POA: Diagnosis not present

## 2021-02-10 DIAGNOSIS — U071 COVID-19: Secondary | ICD-10-CM | POA: Diagnosis not present

## 2021-02-10 LAB — CBC WITH DIFFERENTIAL/PLATELET
Abs Immature Granulocytes: 0.03 10*3/uL (ref 0.00–0.07)
Basophils Absolute: 0 10*3/uL (ref 0.0–0.1)
Basophils Relative: 1 %
Eosinophils Absolute: 0.5 10*3/uL (ref 0.0–0.5)
Eosinophils Relative: 9 %
HCT: 27.7 % — ABNORMAL LOW (ref 36.0–46.0)
Hemoglobin: 9.1 g/dL — ABNORMAL LOW (ref 12.0–15.0)
Immature Granulocytes: 1 %
Lymphocytes Relative: 18 %
Lymphs Abs: 1 10*3/uL (ref 0.7–4.0)
MCH: 31.1 pg (ref 26.0–34.0)
MCHC: 32.9 g/dL (ref 30.0–36.0)
MCV: 94.5 fL (ref 80.0–100.0)
Monocytes Absolute: 0.6 10*3/uL (ref 0.1–1.0)
Monocytes Relative: 11 %
Neutro Abs: 3.2 10*3/uL (ref 1.7–7.7)
Neutrophils Relative %: 60 %
Platelets: 182 10*3/uL (ref 150–400)
RBC: 2.93 MIL/uL — ABNORMAL LOW (ref 3.87–5.11)
RDW: 18 % — ABNORMAL HIGH (ref 11.5–15.5)
WBC: 5.3 10*3/uL (ref 4.0–10.5)
nRBC: 0 % (ref 0.0–0.2)

## 2021-02-10 LAB — BASIC METABOLIC PANEL
Anion gap: 15 (ref 5–15)
BUN: 58 mg/dL — ABNORMAL HIGH (ref 6–20)
CO2: 25 mmol/L (ref 22–32)
Calcium: 10.4 mg/dL — ABNORMAL HIGH (ref 8.9–10.3)
Chloride: 93 mmol/L — ABNORMAL LOW (ref 98–111)
Creatinine, Ser: 11.78 mg/dL — ABNORMAL HIGH (ref 0.44–1.00)
GFR, Estimated: 4 mL/min — ABNORMAL LOW (ref >60)
Glucose, Bld: 125 mg/dL — ABNORMAL HIGH (ref 70–99)
Potassium: 4.2 mmol/L (ref 3.5–5.1)
Sodium: 133 mmol/L — ABNORMAL LOW (ref 135–145)

## 2021-02-10 MED ORDER — ACETAMINOPHEN 325 MG PO TABS
ORAL_TABLET | ORAL | Status: AC
  Filled 2021-02-10: qty 2

## 2021-02-10 NOTE — Progress Notes (Signed)
[facility]  PROGRESS NOTE  Rebekah Burton [id number] DOB: September 09, 1976 DOA: 01/15/2021 PCP: Trey Sailors, PA   Brief HPI:   45 year old female with ESRD on HD MWF, blindness, hypertension, PAD came to hospital with breast abscess.  General surgery was consulted.  She underwent incision and drainage.  She was also found to be incidental Covid positive.  Now she is off contact precautions.  Initially she was started on IV antibiotics which was transitioned to Augmentin.  Patient has completed course of antibiotics.  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  IR and vascular were consulted.  aVF is now working.  Dialysis per nephrology.    Subjective   Patient seen and examined, denies any complaints.   Assessment/Plan:     1. Breast abscess-s/p incision drainage done by general surgery.  She was started on Unasyn, pulmonary cultures grew gram-positive cocci.  She was transitioned to Augmentin and has completed the course. 2. Incidental COVID-19-patient is atraumatic, no evidence of pulmonary involvement.  Isolation was discontinued on 01/19/2021.  Currently on room air. 3. ESRD-nephrology was consulted, had issues with cannulation of aVF.  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  aVF is not working.  Dialysis per nephrology.  Continue sevelamer. 4. Anemia of chronic disease-monitor intermittently, hemoglobin is stable at 9.1.  Continue Aranesp. 5. Hyponatremia-patient on hemodialysis, nephrology following. 6. Hypertension-continue clonidine, Coreg. 7. Disposition-patient was doing with friend before but after she was diagnosed with COVID-19 she did not want patient to come back.  Patient then decided to go with boyfriend in emergency room, process was started to move her Medicaid/dialysis from [facility] to [facility] and  process was anticipated to be completed by 01/26/2021.  However patient then decided not to go and live with her boyfriend anymore.  TOC has been consulted and trying to find a place for her.     COVID-19 Labs  No results for input(s): DDIMER, FERRITIN, LDH, CRP in the last 72 hours.  Lab Results  Component Value Date   SARSCOV2NAA NEGATIVE 01/18/2021   SARSCOV2NAA NEGATIVE 01/15/2021   SARSCOV2NAA POSITIVE (A) 01/15/2021     Scheduled medications:   . carvedilol  25 mg Oral BID WC  . cloNIDine  0.3 mg Oral TID  . darbepoetin (ARANESP) injection - DIALYSIS  150 mcg Intravenous Q Mon-HD  . heparin  5,000 Units Subcutaneous Q8H  . lidocaine-prilocaine  1 application Topical Q M,W,F-HD  . multivitamin  1 tablet Oral QHS  . senna  1 tablet Oral BID  . sevelamer carbonate  3,200 mg Oral TID WC  . sodium chloride flush  3 mL Intravenous Q12H     SpO2: 100 % O2 Flow Rate (L/min): 2 L/min    CBC: Recent Labs  Lab 02/05/21 0641 02/08/21 0649 02/10/21 0106  WBC 6.0 5.7 5.3  NEUTROABS  --   --  3.2  HGB 8.4* 8.4* 9.1*  HCT 27.5* 27.1* 27.7*  MCV 98.2 96.4 94.5  PLT 245 219 [id number]    Basic Metabolic Panel: Recent Labs  Lab 02/05/21 0641 02/08/21 0649 02/10/21 0106  NA 133* 129* 133*  K 4.2 4.3 4.2  CL 97* 93* 93*  CO2 '23 23 25  '$ GLUCOSE 98 108* 125*  BUN 49* 72* 58*  CREATININE 11.52* 13.09* 11.78*  CALCIUM 10.3 10.0 10.4*  PHOS 5.4* 6.3*  --  Liver Function Tests: Recent Labs  Lab 02/05/21 0641 02/08/21 0649  ALBUMIN 3.0* 3.0*     Antibiotics: Anti-infectives (From admission, onward)   Start     Dose/Rate Route Frequency Ordered Stop   01/23/21 1400  amoxicillin-clavulanate (AUGMENTIN) 500-125 MG per tablet 500 mg        500 mg Oral Every 24 hours 01/23/21 1124 01/25/21 1657   01/23/21 1015  amoxicillin-clavulanate (AUGMENTIN) 875-125 MG per tablet 1 tablet  Status:  Discontinued        1 tablet Oral Every 12 hours 01/23/21 1002 01/23/21 1124    01/20/21 0845  vancomycin (VANCOCIN) 1-5 GM/200ML-% IVPB       Note to Pharmacy: Judieth Keens  : cabinet override      01/20/21 0845 01/20/21 2059   01/18/21 1200  vancomycin (VANCOCIN) IVPB 1000 mg/200 mL premix  Status:  Discontinued        1,000 mg 200 mL/hr over 60 Minutes Intravenous Every M-W-F (Hemodialysis) 01/16/21 1017 01/21/21 1324   01/17/21 2200  Ampicillin-Sulbactam (UNASYN) 3 g in sodium chloride 0.9 % 100 mL IVPB  Status:  Discontinued        3 g 200 mL/hr over 30 Minutes Intravenous Daily at bedtime 01/16/21 1058 01/23/21 1002   01/17/21 1000  remdesivir 100 mg in sodium chloride 0.9 % 100 mL IVPB       "Followed by" Linked Group Details   100 mg 200 mL/hr over 30 Minutes Intravenous Daily 01/15/21 2357 01/18/21 0918   01/16/21 1145  Ampicillin-Sulbactam (UNASYN) 3 g in sodium chloride 0.9 % 100 mL IVPB        3 g 200 mL/hr over 30 Minutes Intravenous  Once 01/16/21 1058 01/16/21 1417   01/16/21 1100  vancomycin (VANCOREADY) IVPB 1500 mg/300 mL        1,500 mg 150 mL/hr over 120 Minutes Intravenous  Once 01/16/21 1017 01/16/21 1849   01/16/21 0300  remdesivir 200 mg in sodium chloride 0.9% 250 mL IVPB       "Followed by" Linked Group Details   200 mg 580 mL/hr over 30 Minutes Intravenous Once 01/15/21 2357 01/16/21 0543   01/15/21 2345  amoxicillin-clavulanate (AUGMENTIN) 875-125 MG per tablet 1 tablet  Status:  Discontinued        1 tablet Oral Every 12 hours 01/15/21 2334 01/15/21 2357   01/15/21 1800  vancomycin (VANCOCIN) IVPB 1000 mg/200 mL premix        1,000 mg 200 mL/hr over 60 Minutes Intravenous  Once 01/15/21 1753 01/15/21 2120       DVT prophylaxis: Heparin  Code Status: Full code  Family Communication: No family at bedside   Consultants:  Nephrology  General surgery  Procedures:  I &D of breast abscess    Objective   Vitals:   02/10/21 1030 02/10/21 1100 02/10/21 1110 02/10/21 1121  BP: (!) 121/44 (!) 115/52 137/60 137/60   Pulse: 71 73 83 81  Resp:   16 16  Temp:   97.6 F (36.4 C) 97.8 F (36.6 C)  TempSrc:   Oral Oral  SpO2:   100% 100%  Weight:   99.6 kg   Height:        Intake/Output Summary (Last 24 hours) at 02/10/2021 1539 Last data filed at 02/10/2021 1110 Gross per 24 hour  Intake 360 ml  Output 3000 ml  Net -2640 ml    02/14 1901 - 02/16 0700 In: 1320 [P.O.:1320] Out: 0   [employer]  02/08/21 1033 02/10/21 0800 02/10/21 1110  Weight: 101.5 kg 102.6 kg 99.6 kg    Physical Examination:    General: Appears in no acute distress  Cardiovascular: S1-S2, regular, no murmur auscultated  Respiratory: Clear to auscultation bilaterally  Abdomen: Abdomen is soft, nontender, no organomegaly  Extremities: No edema in the lower extremities  Neurologic: Alert, oriented x3, no focal deficit noted   Status is: Inpatient  Dispo: The patient is from: Home              Anticipated d/c is to: Home              Anticipated d/c date is: 02/12/2021              Patient currently medically stable for discharge  Barrier to discharge-awaiting place for safe discharge       Data Reviewed:   No results found for this or any previous visit (from the past 240 hour(s)).  No results for input(s): LIPASE, AMYLASE in the last 168 hours. No results for input(s): AMMONIA in the last 168 hours.  Cardiac Enzymes: No results for input(s): CKTOTAL, CKMB, CKMBINDEX, TROPONINI in the last 168 hours. BNP (last 3 results) Recent Labs    01/18/21 0136 01/19/21 0030 01/20/21 0020  BNP 1,410.7* 1,122.4* 1,383.7*    ProBNP (last 3 results) No results for input(s): PROBNP in the last 8760 hours.  Studies:  No results found.     Oswald Hillock   [facility] If 7PM-7AM, please contact night-coverage at [email or web address], Office  [phone]   02/10/2021, 3:39 PM  LOS: 26 days

## 2021-02-10 NOTE — Procedures (Signed)
Patient seen on Hemodialysis. BP (!) 143/80   Pulse 66   Temp 97.6 F (36.4 C) (Oral)   Resp 18   Ht '5\' 4"'$  (1.626 m)   Wt 102.6 kg   SpO2 98%   BMI 38.83 kg/m   QB 400, UF goal 3L Tolerating treatment without complaints at this time.   Elmarie Shiley MD [facility]. Office # [phone] Pager # [phone] 8:53 AM

## 2021-02-11 DIAGNOSIS — N186 End stage renal disease: Secondary | ICD-10-CM | POA: Diagnosis not present

## 2021-02-11 DIAGNOSIS — N611 Abscess of the breast and nipple: Secondary | ICD-10-CM | POA: Diagnosis not present

## 2021-02-11 MED ORDER — CHLORHEXIDINE GLUCONATE CLOTH 2 % EX PADS
6.0000 | MEDICATED_PAD | Freq: Every day | CUTANEOUS | Status: DC
  Administered 2021-02-11 – 2021-03-06 (×7): 6 via TOPICAL

## 2021-02-11 NOTE — Progress Notes (Signed)
[facility]  PROGRESS NOTE  Rebekah Burton [id number] DOB: 1976-01-27 DOA: 01/15/2021 PCP: Trey Sailors, PA   Brief HPI:   45 year old female with ESRD on HD MWF, blindness, hypertension, PAD came to hospital with breast abscess.  General surgery was consulted.  She underwent incision and drainage.  She was also found to be incidental Covid positive.  Now she is off contact precautions.  Initially she was started on IV antibiotics which was transitioned to Augmentin.  Patient has completed course of antibiotics.  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  IR and vascular were consulted.  aVF is now working.  Dialysis per nephrology.    Subjective   Patient seen and examined, denies any complaints.   Assessment/Plan:     1. Breast abscess-s/p incision drainage done by general surgery.  She was started on Unasyn, pulmonary cultures grew gram-positive cocci.  She was transitioned to Augmentin and has completed the course. 2. Incidental COVID-19-patient is atraumatic, no evidence of pulmonary involvement.  Isolation was discontinued on 01/19/2021.  Currently on room air. 3. ESRD-nephrology was consulted, had issues with cannulation of aVF.  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  aVF is not working.  Dialysis per nephrology.  Continue sevelamer. 4. Anemia of chronic disease-monitor intermittently, hemoglobin is stable at 9.1.  Continue Aranesp. 5. Hyponatremia-patient on hemodialysis, nephrology following. 6. Hypertension-continue clonidine, Coreg. 7. Disposition-patient was doing with friend before but after she was diagnosed with COVID-19 she did not want patient to come back.  Patient then decided to go with boyfriend in emergency room, process was started to move her Medicaid/dialysis from [facility] to [facility] and  process was anticipated to be completed by 01/26/2021.  However patient then decided not to go and live with her boyfriend anymore.  TOC has been consulted and trying to find a place for her.     COVID-19 Labs  No results for input(s): DDIMER, FERRITIN, LDH, CRP in the last 72 hours.  Lab Results  Component Value Date   SARSCOV2NAA NEGATIVE 01/18/2021   SARSCOV2NAA NEGATIVE 01/15/2021   SARSCOV2NAA POSITIVE (A) 01/15/2021     Scheduled medications:   . carvedilol  25 mg Oral BID WC  . Chlorhexidine Gluconate Cloth  6 each Topical Q0600  . cloNIDine  0.3 mg Oral TID  . darbepoetin (ARANESP) injection - DIALYSIS  150 mcg Intravenous Q Mon-HD  . heparin  5,000 Units Subcutaneous Q8H  . lidocaine-prilocaine  1 application Topical Q M,W,F-HD  . multivitamin  1 tablet Oral QHS  . senna  1 tablet Oral BID  . sevelamer carbonate  3,200 mg Oral TID WC  . sodium chloride flush  3 mL Intravenous Q12H     SpO2: 100 % O2 Flow Rate (L/min): 2 L/min    CBC: Recent Labs  Lab 02/05/21 0641 02/08/21 0649 02/10/21 0106  WBC 6.0 5.7 5.3  NEUTROABS  --   --  3.2  HGB 8.4* 8.4* 9.1*  HCT 27.5* 27.1* 27.7*  MCV 98.2 96.4 94.5  PLT 245 219 Q000111Q    Basic Metabolic Panel: Recent Labs  Lab 02/05/21 0641 02/08/21 0649 02/10/21 0106  NA 133* 129* 133*  K 4.2 4.3 4.2  CL 97* 93* 93*  CO2 '23 23 25  '$ GLUCOSE 98 108* 125*  BUN 49* 72* 58*  CREATININE 11.52* 13.09* 11.78*  CALCIUM 10.3 10.0  10.4*  PHOS 5.4* 6.3*  --      Liver Function Tests: Recent Labs  Lab 02/05/21 0641 02/08/21 0649  ALBUMIN 3.0* 3.0*     Antibiotics: Anti-infectives (From admission, onward)   Start     Dose/Rate Route Frequency Ordered Stop   01/23/21 1400  amoxicillin-clavulanate (AUGMENTIN) 500-125 MG per tablet 500 mg        500 mg Oral Every 24 hours 01/23/21 1124 01/25/21 1657   01/23/21 1015  amoxicillin-clavulanate (AUGMENTIN) 875-125 MG per tablet 1 tablet  Status:  Discontinued        1  tablet Oral Every 12 hours 01/23/21 1002 01/23/21 1124   01/20/21 0845  vancomycin (VANCOCIN) 1-5 GM/200ML-% IVPB       Note to Pharmacy: Judieth Keens  : cabinet override      01/20/21 0845 01/20/21 2059   01/18/21 1200  vancomycin (VANCOCIN) IVPB 1000 mg/200 mL premix  Status:  Discontinued        1,000 mg 200 mL/hr over 60 Minutes Intravenous Every M-W-F (Hemodialysis) 01/16/21 1017 01/21/21 1324   01/17/21 2200  Ampicillin-Sulbactam (UNASYN) 3 g in sodium chloride 0.9 % 100 mL IVPB  Status:  Discontinued        3 g 200 mL/hr over 30 Minutes Intravenous Daily at bedtime 01/16/21 1058 01/23/21 1002   01/17/21 1000  remdesivir 100 mg in sodium chloride 0.9 % 100 mL IVPB       "Followed by" Linked Group Details   100 mg 200 mL/hr over 30 Minutes Intravenous Daily 01/15/21 2357 01/18/21 0918   01/16/21 1145  Ampicillin-Sulbactam (UNASYN) 3 g in sodium chloride 0.9 % 100 mL IVPB        3 g 200 mL/hr over 30 Minutes Intravenous  Once 01/16/21 1058 01/16/21 1417   01/16/21 1100  vancomycin (VANCOREADY) IVPB 1500 mg/300 mL        1,500 mg 150 mL/hr over 120 Minutes Intravenous  Once 01/16/21 1017 01/16/21 1849   01/16/21 0300  remdesivir 200 mg in sodium chloride 0.9% 250 mL IVPB       "Followed by" Linked Group Details   200 mg 580 mL/hr over 30 Minutes Intravenous Once 01/15/21 2357 01/16/21 0543   01/15/21 2345  amoxicillin-clavulanate (AUGMENTIN) 875-125 MG per tablet 1 tablet  Status:  Discontinued        1 tablet Oral Every 12 hours 01/15/21 2334 01/15/21 2357   01/15/21 1800  vancomycin (VANCOCIN) IVPB 1000 mg/200 mL premix        1,000 mg 200 mL/hr over 60 Minutes Intravenous  Once 01/15/21 1753 01/15/21 2120       DVT prophylaxis: Heparin  Code Status: Full code  Family Communication: No family at bedside   Consultants:  Nephrology  General surgery  Procedures:  I &D of breast abscess    Objective   Vitals:   02/10/21 1700 02/10/21 2041 02/11/21 0454  02/11/21 1155  BP: 138/62 120/60 127/63 (!) 139/91  Pulse: 80 72 63 72  Resp: '16 20 18 16  '$ Temp: 97.9 F (36.6 C) 98.6 F (37 C) 98.2 F (36.8 C) 98.4 F (36.9 C)  TempSrc: Oral  Oral   SpO2: 100% 97% 99% 100%  Weight:      Height:       No intake or output data in the 24 hours ending 02/11/21 1610  02/15 1901 - 02/17 0700 In: 240 [P.O.:240] Out: 3000   Filed Weights   02/08/21 1033 02/10/21 0800 02/10/21  1110  Weight: 101.5 kg 102.6 kg 99.6 kg    Physical Examination:    General-appears in no acute distress  Heart-S1-S2, regular, no murmur auscultated  Lungs-clear to auscultation bilaterally, no wheezing or crackles auscultated  Abdomen-soft, nontender, no organomegaly  Extremities-no edema in the lower extremities, s/p bilateral TMA  Neuro-alert, oriented x3, no focal deficit noted   Status is: Inpatient  Dispo: The patient is from: Home              Anticipated d/c is to: Home              Anticipated d/c date is: 02/15/2021              Patient currently medically stable for discharge  Barrier to discharge-awaiting place for safe discharge       Data Reviewed:   No results found for this or any previous visit (from the past 240 hour(s)).  No results for input(s): LIPASE, AMYLASE in the last 168 hours. No results for input(s): AMMONIA in the last 168 hours.  Cardiac Enzymes: No results for input(s): CKTOTAL, CKMB, CKMBINDEX, TROPONINI in the last 168 hours. BNP (last 3 results) Recent Labs    01/18/21 0136 01/19/21 0030 01/20/21 0020  BNP 1,410.7* 1,122.4* 1,383.7*    ProBNP (last 3 results) No results for input(s): PROBNP in the last 8760 hours.  Studies:  No results found.     Oswald Hillock   [facility] If 7PM-7AM, please contact night-coverage at [email or web address], Office  [phone]   02/11/2021, 4:10 PM  LOS: 27 days

## 2021-02-11 NOTE — Progress Notes (Signed)
Physical Therapy Treatment Patient Details Name: Rebekah Burton MRN: [id number] DOB: [birth year] Today's Date: 02/11/2021    History of Present Illness 45 y.o. female with medical history significant of ESRD-HD M-W-F, blindness, HTN, PAD had developed a breast abscess and was seen in ED 01/04/21 where U/S confirmed abscess. She was to have been seen as outpatient for aspiration/drainage but did not get to make that appt. Today in HD the breast abscess opened and drained spontaneously. Because of continued drainage she presents to [facility]ED for evaluation. Found to be COVID+ taken to OR for further exploration, culture, and excisional debridement 01/15/21    PT Comments    Pt received in bed, cooperative and pleasant. Session focused on balance and coordination. Pt min guard for 300 ft of ambulation including backwards walking with head turns and side stepping. Increased sway with backwards walking with head turns and pt needed occasional standing break to reorient herself but no overt LOB. Also completed step taps. Increased unsteadiness with lateral step taps R>L. Decreased stability in single leg stance position, requiring min guard for safety. Verbal cueing for navigation throughout session. Pt left in chair with all needs met and call bell within reach. Continue to work on single leg balance and stability.    Follow Up Recommendations  No PT follow up;Supervision for mobility/OOB     Equipment Recommendations  None recommended by PT    Recommendations for Other Services       Precautions / Restrictions Precautions Precautions: Other (comment) Precaution Comments: pt is blind, needs assist for navigation, L breast abscess Restrictions Weight Bearing Restrictions: No    Mobility  Bed Mobility Overal bed mobility: Modified Independent Bed Mobility: Supine to Sit     Supine to sit: Modified independent (Device/Increase time)          Transfers Overall transfer level: Modified  independent Equipment used: None                Ambulation/Gait Ambulation/Gait assistance: Min guard Gait Distance (Feet): 300 Feet (100 bckwd walking, 100 bckwd walking with headturns, 100 side stepping (50 to L, 50 to R)) Assistive device: None Gait Pattern/deviations: Step-through pattern;Decreased step length - right;Decreased step length - left;Drifts right/left Gait velocity: decreased   General Gait Details: Cueing for navigation, decreased speed with backwards walking and side stepping but no overt LOB, minimal swaying with backwards walking with headturns and need occassional standing breaks to reorient herself in space   Stairs             Wheelchair Mobility    Modified Rankin (Stroke Patients Only)       Balance Overall balance assessment: Needs assistance Sitting-balance support: No upper extremity supported;Feet supported Sitting balance-Leahy Scale: Normal     Standing balance support: During functional activity;No upper extremity supported Standing balance-Leahy Scale: Good Standing balance comment: Steady in static standing and foward walking                            Cognition Arousal/Alertness: Awake/alert Behavior During Therapy: WFL for tasks assessed/performed Overall Cognitive Status: Within Functional Limits for tasks assessed                                 General Comments: very pleasant and cooperative, willing to try new exercises      Exercises Other Exercises Other Exercises: Forward step taps, 20 each side with  3 fingers on R railing Other Exercises: Lateral step taps, 10 B with 2 fingers on railing, 10 B with no support (Increased sway (more with R lateral step taps))    General Comments General comments (skin integrity, edema, etc.): Steady in double leg stance, increased sway in single leg stance activity, more sway noted in L single leg stance      Pertinent Vitals/Pain Pain Assessment:  No/denies pain    Home Living                      Prior Function            PT Goals (current goals can now be found in the care plan section) Acute Rehab PT Goals Patient Stated Goal: have less pain PT Goal Formulation: With patient Potential to Achieve Goals: Good Additional Goals Additional Goal #1: Pt will be able to navigate her new home environment with supervision Additional Goal #2: Will score at least 18 on DGI Progress towards PT goals: Progressing toward goals    Frequency    Min 2X/week      PT Plan Current plan remains appropriate    Co-evaluation              AM-PAC PT "6 Clicks" Mobility   Outcome Measure  Help needed turning from your back to your side while in a flat bed without using bedrails?: None Help needed moving from lying on your back to sitting on the side of a flat bed without using bedrails?: None Help needed moving to and from a bed to a chair (including a wheelchair)?: None Help needed standing up from a chair using your arms (e.g., wheelchair or bedside chair)?: None Help needed to walk in hospital room?: A Little Help needed climbing 3-5 steps with a railing? : A Little 6 Click Score: 22    End of Session Equipment Utilized During Treatment: Gait belt Activity Tolerance: Patient tolerated treatment well Patient left: with call bell/phone within reach;in chair (sitting at EOB per her request) Nurse Communication: Mobility status PT Visit Diagnosis: Other abnormalities of gait and mobility (R26.89);Pain Pain - part of body:  (L breast and UE)     Time:  -     Charges:                       Rosita Kea, SPT

## 2021-02-11 NOTE — Progress Notes (Signed)
[facility] Progress Note   Subjective:   Patient seen and examined at bedside.  Reports feeling well.  No new complaints.  Tolerated dialysis well yesterday.  Still waiting for safe d/c plan.  Denies CP, SOB, abdominal pain, and n/v/d.   Objective Vitals:   02/10/21 1121 02/10/21 1700 02/10/21 2041 02/11/21 0454  BP: 137/60 138/62 120/60 127/63  Pulse: 81 80 72 63  Resp: '16 16 20 18  '$ Temp: 97.8 F (36.6 C) 97.9 F (36.6 C) 98.6 F (37 C) 98.2 F (36.8 C)  TempSrc: Oral Oral  Oral  SpO2: 100% 100% 97% 99%  Weight:      Height:       Physical Exam General:chronically ill appearing female in NAD Heart:RRR, no mrg Lungs:CTAB, nml WOB on RA Abdomen:soft, NTND Extremities:no LE edema Dialysis Access: LU AVF +b/t   Filed Weights   02/08/21 1033 02/10/21 0800 02/10/21 1110  Weight: 101.5 kg 102.6 kg 99.6 kg    Intake/Output Summary (Last 24 hours) at 02/11/2021 0706 Last data filed at 02/10/2021 1110 Gross per 24 hour  Intake --  Output 3000 ml  Net -3000 ml    Additional Objective Labs: Basic Metabolic Panel: Recent Labs  Lab 02/05/21 0641 02/08/21 0649 02/10/21 0106  NA 133* 129* 133*  K 4.2 4.3 4.2  CL 97* 93* 93*  CO2 '23 23 25  '$ GLUCOSE 98 108* 125*  BUN 49* 72* 58*  CREATININE 11.52* 13.09* 11.78*  CALCIUM 10.3 10.0 10.4*  PHOS 5.4* 6.3*  --    Liver Function Tests: Recent Labs  Lab 02/05/21 0641 02/08/21 0649  ALBUMIN 3.0* 3.0*   CBC: Recent Labs  Lab 02/05/21 0641 02/08/21 0649 02/10/21 0106  WBC 6.0 5.7 5.3  NEUTROABS  --   --  3.2  HGB 8.4* 8.4* 9.1*  HCT 27.5* 27.1* 27.7*  MCV 98.2 96.4 94.5  PLT 245 219 182    Medications: . sodium chloride     . carvedilol  25 mg Oral BID WC  . cloNIDine  0.3 mg Oral TID  . darbepoetin (ARANESP) injection - DIALYSIS  150 mcg Intravenous Q Mon-HD  . heparin  5,000 Units Subcutaneous Q8H  . lidocaine-prilocaine  1 application Topical Q M,W,F-HD  . multivitamin  1 tablet Oral QHS   . senna  1 tablet Oral BID  . sevelamer carbonate  3,200 mg Oral TID WC  . sodium chloride flush  3 mL Intravenous Q12H    Dialysis Orders: [place] 4h 62mn 2/2.5 bath 105kg AVF Heparin ? parsabiv 15 mg three times a week with HD mircera 150 mcg every 2 weeks - last given 01/13/21 hectorol 3 mcg three times a week  Assessment/Plan: 1. End-stage renal disease - on MWF. Tolerating dialysis well. Continue regular schedule with HD tomorrow.  2. 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 3. Left breast abscess- s/p I&D, completed course of Augmentin. per primary  4. covid-19 infection-resolved,off of isolation. 5. HTN- BP in goal. Does not appear volume overloaded on exam. Continue current medications and UF with HD as tolerated. Will need EDW lowered at discharge.  Last weight 99.6kg. 6. Anemia CKD - Hgb^9.1.Aranesp increased to1566m weekly,iron saturation 34%, ferritin 1976, no indication for Fe supplementation. 7. Metabolic bone disease- Phosup, continue binders, may need to increase if continues to run high.  Checking again tomorrow. Corrected calcium  high, hectorol on hold, use low Ca bath. Not able to get parsabiv here.  8. Social Issues:AwaitingALF.  SW working with her.     Jen Mow, PA-C [facility] 02/11/2021,7:06 AM  LOS: 27 days

## 2021-02-11 NOTE — TOC Progression Note (Signed)
Transition of Care [facility]) - Progression Note    Patient Details  Name: Rebekah Burton MRN: [id number] Date of Birth: Dec 03, 1976  Transition of Care [facility]) CM/SW Contact  Bartholomew Crews, RN Phone Number: [phone] 02/11/2021, 4:53 PM  Clinical Narrative:     Call to Aaron Edelman in Peer Support to follow up with request for [place]. Pending call back.   Expected Discharge Plan: [facility] Services Barriers to Discharge: Other (comment) (homeless - looking for resorces in [facility])  Expected Discharge Plan and Services Expected Discharge Plan: [place]   Discharge Planning Services: CM Consult Post Acute Care Choice: [place] arrangements for the past 2 months: Single Family Home                 DME Arranged: N/A DME Agency: NA       HH Arranged: RN [place] Agency: [facility] Date HH Agency Contacted: 01/21/21 Time [place]: [id number] Representative spoke with at [place]: [place] ([place]) Interventions    Readmission Risk Interventions No flowsheet data found.

## 2021-02-12 DIAGNOSIS — N611 Abscess of the breast and nipple: Secondary | ICD-10-CM | POA: Diagnosis not present

## 2021-02-12 DIAGNOSIS — N186 End stage renal disease: Secondary | ICD-10-CM | POA: Diagnosis not present

## 2021-02-12 LAB — CBC
HCT: 28.3 % — ABNORMAL LOW (ref 36.0–46.0)
Hemoglobin: 8.5 g/dL — ABNORMAL LOW (ref 12.0–15.0)
MCH: 29.9 pg (ref 26.0–34.0)
MCHC: 30 g/dL (ref 30.0–36.0)
MCV: 99.6 fL (ref 80.0–100.0)
Platelets: 202 10*3/uL (ref 150–400)
RBC: 2.84 MIL/uL — ABNORMAL LOW (ref 3.87–5.11)
RDW: 18.6 % — ABNORMAL HIGH (ref 11.5–15.5)
WBC: 5.5 10*3/uL (ref 4.0–10.5)
nRBC: 0 % (ref 0.0–0.2)

## 2021-02-12 LAB — RENAL FUNCTION PANEL
Albumin: 3.2 g/dL — ABNORMAL LOW (ref 3.5–5.0)
Anion gap: 14 (ref 5–15)
BUN: 53 mg/dL — ABNORMAL HIGH (ref 6–20)
CO2: 29 mmol/L (ref 22–32)
Calcium: 10.2 mg/dL (ref 8.9–10.3)
Chloride: 91 mmol/L — ABNORMAL LOW (ref 98–111)
Creatinine, Ser: 12.81 mg/dL — ABNORMAL HIGH (ref 0.44–1.00)
GFR, Estimated: 3 mL/min — ABNORMAL LOW (ref >60)
Glucose, Bld: 131 mg/dL — ABNORMAL HIGH (ref 70–99)
Phosphorus: 5.1 mg/dL — ABNORMAL HIGH (ref 2.5–4.6)
Potassium: 3.7 mmol/L (ref 3.5–5.1)
Sodium: 134 mmol/L — ABNORMAL LOW (ref 135–145)

## 2021-02-12 MED ORDER — LIDOCAINE HCL (PF) 1 % IJ SOLN: 5.0000 mL | INTRAMUSCULAR | Status: DC | PRN

## 2021-02-12 MED ORDER — LIDOCAINE-PRILOCAINE 2.5-2.5 % EX CREA: 1.0000 "application " | TOPICAL_CREAM | CUTANEOUS | Status: DC | PRN

## 2021-02-12 MED ORDER — HEPARIN SODIUM (PORCINE) 1000 UNIT/ML DIALYSIS: 1000.0000 [IU] | INTRAMUSCULAR | Status: DC | PRN

## 2021-02-12 MED ORDER — ALTEPLASE 2 MG IJ SOLR: 2.0000 mg | Freq: Once | INTRAMUSCULAR | Status: DC | PRN

## 2021-02-12 MED ORDER — SODIUM CHLORIDE 0.9 % IV SOLN: 100.0000 mL | INTRAVENOUS | Status: DC | PRN

## 2021-02-12 MED ORDER — PENTAFLUOROPROP-TETRAFLUOROETH EX AERO
1.0000 | INHALATION_SPRAY | CUTANEOUS | Status: DC | PRN
Start: 2021-02-12 — End: 2021-02-12

## 2021-02-12 MED ORDER — PENTAFLUOROPROP-TETRAFLUOROETH EX AERO: 1.0000 "application " | INHALATION_SPRAY | CUTANEOUS | Status: DC | PRN

## 2021-02-12 MED ORDER — ACETAMINOPHEN 325 MG PO TABS
ORAL_TABLET | ORAL | Status: AC
  Filled 2021-02-12: qty 2

## 2021-02-12 NOTE — Progress Notes (Signed)
[facility]  PROGRESS NOTE  Rebekah Burton [id number] DOB: [birth year] DOA: 01/15/2021 PCP: Trey Sailors, PA   Brief HPI:   45 year old female with ESRD on HD MWF, blindness, hypertension, PAD came to hospital with breast abscess.  General surgery was consulted.  She underwent incision and drainage.  She was also found to be incidental Covid positive.  Now she is off contact precautions.  Initially she was started on IV antibiotics which was transitioned to Augmentin.  Patient has completed course of antibiotics.  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  IR and vascular were consulted.  aVF is now working.  Dialysis per nephrology.    Subjective   Patient seen and examined, no new complaints.   Assessment/Plan:     1. Breast abscess-s/p incision drainage done by general surgery.  She was started on Unasyn, pulmonary cultures grew gram-positive cocci.  She was transitioned to Augmentin and has completed the course. 2. Incidental COVID-19-patient is atraumatic, no evidence of pulmonary involvement.  Isolation was discontinued on 01/19/2021.  Currently on room air. 3. ESRD-nephrology was consulted, had issues with cannulation of aVF.  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  aVF is not working.  Dialysis per nephrology.  Continue sevelamer. 4. Anemia of chronic disease-monitor intermittently, hemoglobin is stable at 9.1.  Continue Aranesp. 5. Hyponatremia-patient on hemodialysis, nephrology following. 6. Hypertension-continue clonidine, Coreg. 7. Disposition-patient was doing with friend before but after she was diagnosed with COVID-19 she did not want patient to come back.  Patient then decided to go with boyfriend in emergency room, process was started to move her Medicaid/dialysis from [facility] to [facility] and  process was anticipated to be completed by 01/26/2021.  However patient then decided not to go and live with her boyfriend anymore.  TOC has been consulted and trying to find a place for her.     COVID-19 Labs  No results for input(s): DDIMER, FERRITIN, LDH, CRP in the last 72 hours.  Lab Results  Component Value Date   SARSCOV2NAA NEGATIVE 01/18/2021   SARSCOV2NAA NEGATIVE 01/15/2021   SARSCOV2NAA POSITIVE (A) 01/15/2021     Scheduled medications:   . carvedilol  25 mg Oral BID WC  . Chlorhexidine Gluconate Cloth  6 each Topical Q0600  . cloNIDine  0.3 mg Oral TID  . darbepoetin (ARANESP) injection - DIALYSIS  150 mcg Intravenous Q Mon-HD  . heparin  5,000 Units Subcutaneous Q8H  . lidocaine-prilocaine  1 application Topical Q M,W,F-HD  . multivitamin  1 tablet Oral QHS  . senna  1 tablet Oral BID  . sevelamer carbonate  3,200 mg Oral TID WC  . sodium chloride flush  3 mL Intravenous Q12H     SpO2: 100 % O2 Flow Rate (L/min): 2 L/min    CBC: Recent Labs  Lab 02/08/21 0649 02/10/21 0106 02/12/21 1351  WBC 5.7 5.3 5.5  NEUTROABS  --  3.2  --   HGB 8.4* 9.1* 8.5*  HCT 27.1* 27.7* 28.3*  MCV 96.4 94.5 99.6  PLT 219 182 123XX123    Basic Metabolic Panel: Recent Labs  Lab 02/08/21 0649 02/10/21 0106  NA 129* 133*  K 4.3 4.2  CL 93* 93*  CO2 23 25  GLUCOSE 108* 125*  BUN 72* 58*  CREATININE 13.09* 11.78*  CALCIUM 10.0 10.4*  PHOS 6.3*  --  Liver Function Tests: Recent Labs  Lab 02/08/21 0649  ALBUMIN 3.0*     Antibiotics: Anti-infectives (From admission, onward)   Start     Dose/Rate Route Frequency Ordered Stop   01/23/21 1400  amoxicillin-clavulanate (AUGMENTIN) 500-125 MG per tablet 500 mg        500 mg Oral Every 24 hours 01/23/21 1124 01/25/21 1657   01/23/21 1015  amoxicillin-clavulanate (AUGMENTIN) 875-125 MG per tablet 1 tablet  Status:  Discontinued        1 tablet Oral Every 12 hours 01/23/21 1002 01/23/21 1124   01/20/21 0845   vancomycin (VANCOCIN) 1-5 GM/200ML-% IVPB       Note to Pharmacy: Judieth Keens  : cabinet override      01/20/21 0845 01/20/21 2059   01/18/21 1200  vancomycin (VANCOCIN) IVPB 1000 mg/200 mL premix  Status:  Discontinued        1,000 mg 200 mL/hr over 60 Minutes Intravenous Every M-W-F (Hemodialysis) 01/16/21 1017 01/21/21 1324   01/17/21 2200  Ampicillin-Sulbactam (UNASYN) 3 g in sodium chloride 0.9 % 100 mL IVPB  Status:  Discontinued        3 g 200 mL/hr over 30 Minutes Intravenous Daily at bedtime 01/16/21 1058 01/23/21 1002   01/17/21 1000  remdesivir 100 mg in sodium chloride 0.9 % 100 mL IVPB       "Followed by" Linked Group Details   100 mg 200 mL/hr over 30 Minutes Intravenous Daily 01/15/21 2357 01/18/21 0918   01/16/21 1145  Ampicillin-Sulbactam (UNASYN) 3 g in sodium chloride 0.9 % 100 mL IVPB        3 g 200 mL/hr over 30 Minutes Intravenous  Once 01/16/21 1058 01/16/21 1417   01/16/21 1100  vancomycin (VANCOREADY) IVPB 1500 mg/300 mL        1,500 mg 150 mL/hr over 120 Minutes Intravenous  Once 01/16/21 1017 01/16/21 1849   01/16/21 0300  remdesivir 200 mg in sodium chloride 0.9% 250 mL IVPB       "Followed by" Linked Group Details   200 mg 580 mL/hr over 30 Minutes Intravenous Once 01/15/21 2357 01/16/21 0543   01/15/21 2345  amoxicillin-clavulanate (AUGMENTIN) 875-125 MG per tablet 1 tablet  Status:  Discontinued        1 tablet Oral Every 12 hours 01/15/21 2334 01/15/21 2357   01/15/21 1800  vancomycin (VANCOCIN) IVPB 1000 mg/200 mL premix        1,000 mg 200 mL/hr over 60 Minutes Intravenous  Once 01/15/21 1753 01/15/21 2120       DVT prophylaxis: Heparin  Code Status: Full code  Family Communication: No family at bedside   Consultants:  Nephrology  General surgery  Procedures:  I &D of breast abscess    Objective   Vitals:   02/12/21 1250 02/12/21 1255 02/12/21 1305 02/12/21 1330  BP:  133/90 (!) 152/80 132/72  Pulse:  64 62 63  Resp:   16    Temp:  98.3 F (36.8 C)    TempSrc:  Oral    SpO2:  100%    Weight: 102.7 kg     Height:        Intake/Output Summary (Last 24 hours) at 02/12/2021 1412 Last data filed at 02/12/2021 1042 Gross per 24 hour  Intake 700 ml  Output 0 ml  Net 700 ml    02/16 1901 - 02/18 0700 In: 840 [P.O.:840] Out: 0   Filed Weights   02/10/21 0800 02/10/21 1110 02/12/21 1250  Weight: 102.6 kg 99.6 kg 102.7 kg    Physical Examination:   General-appears in no acute distress Heart-S1-S2, regular, no murmur auscultated Lungs-clear to auscultation bilaterally, no wheezing or crackles auscultated Abdomen-soft, nontender, no organomegaly Extremities-no edema in the lower extremities, s/p bilateral TMA Neuro-alert, oriented x3, no focal deficit noted  Status is: Inpatient  Dispo: The patient is from: Home              Anticipated d/c is to: Home              Anticipated d/c date is: 02/15/2021              Patient currently medically stable for discharge  Barrier to discharge-awaiting place for safe discharge       Data Reviewed:   No results found for this or any previous visit (from the past 240 hour(s)).  No results for input(s): LIPASE, AMYLASE in the last 168 hours. No results for input(s): AMMONIA in the last 168 hours.  Cardiac Enzymes: No results for input(s): CKTOTAL, CKMB, CKMBINDEX, TROPONINI in the last 168 hours. BNP (last 3 results) Recent Labs    01/18/21 0136 01/19/21 0030 01/20/21 0020  BNP 1,410.7* 1,122.4* 1,383.7*    ProBNP (last 3 results) No results for input(s): PROBNP in the last 8760 hours.  Studies:  No results found.     Oswald Hillock   [facility] If 7PM-7AM, please contact night-coverage at [email or web address], Office  [phone]   02/12/2021, 2:12 PM  LOS: 28 days

## 2021-02-12 NOTE — Care Management Important Message (Signed)
Important Message  Patient Details  Name: Rebekah Burton MRN: [id number] Date of Birth: May 19, 1976   Medicare Important Message Given:  Yes - Important Message mailed due to current National Emergency    Verbal consent obtained due to current National Emergency  Relationship to patient: Self Contact Name: Minna Behmer Call Date: 02/12/21  Time: 1333 Phone: [phone] Outcome: Spoke with contact Important Message mailed to: Other (must enter comment) (patient declined additional copy of IM)     Kamayah Pillay P Xiara Knisley 02/12/2021, 1:33 PM

## 2021-02-12 NOTE — Progress Notes (Signed)
[facility] Progress Note   Subjective:   Seen in room. . Had some difficultly with cannulation of AVF on Wednesday, reports infiltration. Access arm sore today. Discharge planning ongoing.   Objective Vitals:   02/11/21 1155 02/11/21 1846 02/11/21 2017 02/12/21 0435  BP: (!) 139/91 126/73 (!) 155/82 (!) 155/92  Pulse: 72 67 66 71  Resp: '16 18 20 18  '$ Temp: 98.4 F (36.9 C) 98.1 F (36.7 C) 98.1 F (36.7 C) (!) 100.4 F (38 C)  TempSrc:   Oral Oral  SpO2: 100% 100% 99% 99%  Weight:      Height:       Physical Exam General:chronically ill appearing female in NAD Heart:RRR, no mrg Lungs:CTAB, nml WOB on RA Abdomen:soft, NTND Extremities:no LE edema Dialysis Access: LU AVF +b/t   Filed Weights   02/08/21 1033 02/10/21 0800 02/10/21 1110  Weight: 101.5 kg 102.6 kg 99.6 kg    Intake/Output Summary (Last 24 hours) at 02/12/2021 0901 Last data filed at 02/12/2021 0200 Gross per 24 hour  Intake 480 ml  Output 0 ml  Net 480 ml    Additional Objective Labs: Basic Metabolic Panel: Recent Labs  Lab 02/08/21 0649 02/10/21 0106  NA 129* 133*  K 4.3 4.2  CL 93* 93*  CO2 23 25  GLUCOSE 108* 125*  BUN 72* 58*  CREATININE 13.09* 11.78*  CALCIUM 10.0 10.4*  PHOS 6.3*  --    Liver Function Tests: Recent Labs  Lab 02/08/21 0649  ALBUMIN 3.0*   CBC: Recent Labs  Lab 02/08/21 0649 02/10/21 0106  WBC 5.7 5.3  NEUTROABS  --  3.2  HGB 8.4* 9.1*  HCT 27.1* 27.7*  MCV 96.4 94.5  PLT 219 182    Medications: . sodium chloride     . carvedilol  25 mg Oral BID WC  . Chlorhexidine Gluconate Cloth  6 each Topical Q0600  . cloNIDine  0.3 mg Oral TID  . darbepoetin (ARANESP) injection - DIALYSIS  150 mcg Intravenous Q Mon-HD  . heparin  5,000 Units Subcutaneous Q8H  . lidocaine-prilocaine  1 application Topical Q M,W,F-HD  . multivitamin  1 tablet Oral QHS  . senna  1 tablet Oral BID  . sevelamer carbonate  3,200 mg Oral TID WC  . sodium chloride  flush  3 mL Intravenous Q12H    Dialysis Orders: [place] 4h 68mn 2/2.5 bath 105kg AVF Heparin ? parsabiv 15 mg three times a week with HD mircera 150 mcg every 2 weeks - last given 01/13/21 hectorol 3 mcg three times a week  Assessment/Plan: 1. End-stage renal disease - on MWF. Tolerating dialysis well. HD today on schedule.  2. 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  3. Left breast abscess- s/p I&D, completed course of Augmentin. per primary  4. covid-19 infection-resolved,off of isolation. 5. HTN- BP in goal. Does not appear volume overloaded on exam. Continue current medications and UF with HD as tolerated. Will need EDW lowered at discharge.  Last weight 99.6kg. 6. Anemia CKD - Hgb^9.1.Aranesp increased to1519m weekly,iron saturation 34%, ferritin 1976, no indication for Fe supplementation. 7. Metabolic bone disease- Phosup, continue binders, may need to increase if continues to run high.  . Corrected calcium high, hectorol on hold, use low Ca bath. Not able to get parsabiv here.  8. Social Issues:AwaitingALF.  SW working with her.  Lynnda Child PA-C [facility] 02/12/2021,9:02 AM

## 2021-02-12 NOTE — Procedures (Signed)
Patient seen on Hemodialysis. BP 130/73   Pulse 65   Temp 98.3 F (36.8 C) (Oral)   Resp 16   Ht '5\' 4"'$  (1.626 m)   Wt 102.7 kg   SpO2 100%   BMI 38.86 kg/m   QB 400, UF goal 3L Tolerating treatment without complaints at this time.   Elmarie Shiley MD [facility]. Office # [phone] Pager # [phone] 2:18 PM

## 2021-02-13 DIAGNOSIS — N611 Abscess of the breast and nipple: Secondary | ICD-10-CM | POA: Diagnosis not present

## 2021-02-13 DIAGNOSIS — N186 End stage renal disease: Secondary | ICD-10-CM | POA: Diagnosis not present

## 2021-02-13 NOTE — Progress Notes (Signed)
[facility]  PROGRESS NOTE  Rebekah Burton [id number] DOB: [birth year] DOA: 01/15/2021 PCP: Trey Sailors, PA   Brief HPI:   45 year old female with ESRD on HD MWF, blindness, hypertension, PAD came to hospital with breast abscess.  General surgery was consulted.  She underwent incision and drainage.  She was also found to be incidental Covid positive.  Now she is off contact precautions.  Initially she was started on IV antibiotics which was transitioned to Augmentin.  Patient has completed course of antibiotics.  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  IR and vascular were consulted.  aVF is now working.  Dialysis per nephrology.    Subjective   Patient seen and examined, no new complaints.   Assessment/Plan:     1. Breast abscess-s/p incision drainage done by general surgery.  She was started on Unasyn, pulmonary cultures grew gram-positive cocci.  She was transitioned to Augmentin and has completed the course. 2. Incidental COVID-19-patient is atraumatic, no evidence of pulmonary involvement.  Isolation was discontinued on 01/19/2021.  Currently on room air. 3. ESRD-nephrology was consulted, had issues with cannulation of aVF.  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  aVF is not working.  Dialysis per nephrology.  Continue sevelamer. 4. Anemia of chronic disease-monitor intermittently, hemoglobin is stable at 9.1.  Continue Aranesp. 5. Hyponatremia-patient on hemodialysis, nephrology following. 6. Hypertension-continue clonidine, Coreg. 7. Disposition-patient was doing with friend before but after she was diagnosed with COVID-19 she did not want patient to come back.  Patient then decided to go with boyfriend in emergency room, process was started to move her Medicaid/dialysis from [facility] to [facility] and  process was anticipated to be completed by 01/26/2021.  However patient then decided not to go and live with her boyfriend anymore.  TOC has been consulted and trying to find a place for her.     COVID-19 Labs  No results for input(s): DDIMER, FERRITIN, LDH, CRP in the last 72 hours.  Lab Results  Component Value Date   SARSCOV2NAA NEGATIVE 01/18/2021   SARSCOV2NAA NEGATIVE 01/15/2021   SARSCOV2NAA POSITIVE (A) 01/15/2021     Scheduled medications:   . carvedilol  25 mg Oral BID WC  . Chlorhexidine Gluconate Cloth  6 each Topical Q0600  . cloNIDine  0.3 mg Oral TID  . darbepoetin (ARANESP) injection - DIALYSIS  150 mcg Intravenous Q Mon-HD  . heparin  5,000 Units Subcutaneous Q8H  . lidocaine-prilocaine  1 application Topical Q M,W,F-HD  . multivitamin  1 tablet Oral QHS  . senna  1 tablet Oral BID  . sevelamer carbonate  3,200 mg Oral TID WC  . sodium chloride flush  3 mL Intravenous Q12H     SpO2: 100 % O2 Flow Rate (L/min): 2 L/min    CBC: Recent Labs  Lab 02/08/21 0649 02/10/21 0106 02/12/21 1351  WBC 5.7 5.3 5.5  NEUTROABS  --  3.2  --   HGB 8.4* 9.1* 8.5*  HCT 27.1* 27.7* 28.3*  MCV 96.4 94.5 99.6  PLT 219 182 [id number]    Basic Metabolic Panel: Recent Labs  Lab 02/08/21 0649 02/10/21 0106 02/12/21 1304  NA 129* 133* 134*  K 4.3 4.2 3.7  CL 93* 93* 91*  CO2 '23 25 29  '$ GLUCOSE 108* 125* 131*  BUN 72* 58* 53*  CREATININE 13.09* 11.78* 12.81*  CALCIUM 10.0 10.4*  10.2  PHOS 6.3*  --  5.1*     Liver Function Tests: Recent Labs  Lab 02/08/21 0649 02/12/21 1304  ALBUMIN 3.0* 3.2*     Antibiotics: Anti-infectives (From admission, onward)   Start     Dose/Rate Route Frequency Ordered Stop   01/23/21 1400  amoxicillin-clavulanate (AUGMENTIN) 500-125 MG per tablet 500 mg        500 mg Oral Every 24 hours 01/23/21 1124 01/25/21 1657   01/23/21 1015  amoxicillin-clavulanate (AUGMENTIN) 875-125 MG per tablet 1 tablet  Status:  Discontinued        1  tablet Oral Every 12 hours 01/23/21 1002 01/23/21 1124   01/20/21 0845  vancomycin (VANCOCIN) 1-5 GM/200ML-% IVPB       Note to Pharmacy: Judieth Keens  : cabinet override      01/20/21 0845 01/20/21 2059   01/18/21 1200  vancomycin (VANCOCIN) IVPB 1000 mg/200 mL premix  Status:  Discontinued        1,000 mg 200 mL/hr over 60 Minutes Intravenous Every M-W-F (Hemodialysis) 01/16/21 1017 01/21/21 1324   01/17/21 2200  Ampicillin-Sulbactam (UNASYN) 3 g in sodium chloride 0.9 % 100 mL IVPB  Status:  Discontinued        3 g 200 mL/hr over 30 Minutes Intravenous Daily at bedtime 01/16/21 1058 01/23/21 1002   01/17/21 1000  remdesivir 100 mg in sodium chloride 0.9 % 100 mL IVPB       "Followed by" Linked Group Details   100 mg 200 mL/hr over 30 Minutes Intravenous Daily 01/15/21 2357 01/18/21 0918   01/16/21 1145  Ampicillin-Sulbactam (UNASYN) 3 g in sodium chloride 0.9 % 100 mL IVPB        3 g 200 mL/hr over 30 Minutes Intravenous  Once 01/16/21 1058 01/16/21 1417   01/16/21 1100  vancomycin (VANCOREADY) IVPB 1500 mg/300 mL        1,500 mg 150 mL/hr over 120 Minutes Intravenous  Once 01/16/21 1017 01/16/21 1849   01/16/21 0300  remdesivir 200 mg in sodium chloride 0.9% 250 mL IVPB       "Followed by" Linked Group Details   200 mg 580 mL/hr over 30 Minutes Intravenous Once 01/15/21 2357 01/16/21 0543   01/15/21 2345  amoxicillin-clavulanate (AUGMENTIN) 875-125 MG per tablet 1 tablet  Status:  Discontinued        1 tablet Oral Every 12 hours 01/15/21 2334 01/15/21 2357   01/15/21 1800  vancomycin (VANCOCIN) IVPB 1000 mg/200 mL premix        1,000 mg 200 mL/hr over 60 Minutes Intravenous  Once 01/15/21 1753 01/15/21 2120       DVT prophylaxis: Heparin  Code Status: Full code  Family Communication: No family at bedside   Consultants:  Nephrology  General surgery  Procedures:  I &D of breast abscess    Objective   Vitals:   02/12/21 1746 02/12/21 2016 02/13/21 0505  02/13/21 0900  BP: (!) 141/82 136/71 (!) 154/94 (!) 148/88  Pulse: 73 76 66 62  Resp: '19 20 20 20  '$ Temp: 98.1 F (36.7 C) 97.9 F (36.6 C) 97.8 F (36.6 C) 98.1 F (36.7 C)  TempSrc: Oral Oral Oral Oral  SpO2: 99% 99% 100% 100%  Weight:      Height:        Intake/Output Summary (Last 24 hours) at 02/13/2021 1322 Last data filed at 02/12/2021 2200 Gross per 24 hour  Intake 680 ml  Output 3000 ml  Net -2320 ml  02/17 1901 - 02/19 0700 In: 1380 [P.O.:1380] Out: 3000   Filed Weights   02/10/21 1110 02/12/21 1250 02/12/21 1605  Weight: 99.6 kg 102.7 kg 99.5 kg    Physical Examination:   General-appears in no acute distress Heart-S1-S2, regular, no murmur auscultated Lungs-clear to auscultation bilaterally, no wheezing or crackles auscultated Abdomen-soft, nontender, no organomegaly Extremities-no edema in the lower extremities Neuro-alert, oriented x3, no focal deficit noted  Status is: Inpatient  Dispo: The patient is from: Home              Anticipated d/c is to: Home              Anticipated d/c date is: 02/15/2021              Patient currently medically stable for discharge  Barrier to discharge-awaiting place for safe discharge       Data Reviewed:   No results found for this or any previous visit (from the past 240 hour(s)).  No results for input(s): LIPASE, AMYLASE in the last 168 hours. No results for input(s): AMMONIA in the last 168 hours.  Cardiac Enzymes: No results for input(s): CKTOTAL, CKMB, CKMBINDEX, TROPONINI in the last 168 hours. BNP (last 3 results) Recent Labs    01/18/21 0136 01/19/21 0030 01/20/21 0020  BNP 1,410.7* 1,122.4* 1,383.7*    ProBNP (last 3 results) No results for input(s): PROBNP in the last 8760 hours.  Studies:  No results found.     Oswald Hillock   [facility] If 7PM-7AM, please contact night-coverage at [email or web address], Office  [phone]   02/13/2021, 1:22 PM  LOS: 29 days

## 2021-02-13 NOTE — Plan of Care (Signed)
  Problem: Health Behavior/Discharge Planning: Goal: Ability to manage health-related needs will improve Outcome: Progressing   Problem: Pain Managment: Goal: General experience of comfort will improve Outcome: Progressing   

## 2021-02-13 NOTE — Plan of Care (Signed)
  Problem: Health Behavior/Discharge Planning: Goal: Ability to manage health-related needs will improve Outcome: Progressing   Problem: Pain Managment: Goal: General experience of comfort will improve Outcome: Progressing   Problem: Safety: Goal: Ability to remain free from injury will improve Outcome: Progressing   

## 2021-02-13 NOTE — Progress Notes (Signed)
[facility] Progress Note   Subjective:   Completed dialysis yesterday, net UF 3L. No issues with cannulating fistula. No complaints this am.   Objective Vitals:   02/12/21 1605 02/12/21 1746 02/12/21 2016 02/13/21 0505  BP: (!) 129/96 (!) 141/82 136/71 (!) 154/94  Pulse: 71 73 76 66  Resp: '18 19 20 20  '$ Temp: 97.8 F (36.6 C) 98.1 F (36.7 C) 97.9 F (36.6 C) 97.8 F (36.6 C)  TempSrc: Oral Oral Oral Oral  SpO2: 100% 99% 99% 100%  Weight: 99.5 kg     Height:       Physical Exam General:chronically ill appearing female in NAD Heart:RRR, no mrg Lungs:CTAB, nml WOB on RA Abdomen:soft, NTND Extremities:no LE edema Dialysis Access: LU AVF +b/t   Filed Weights   02/10/21 1110 02/12/21 1250 02/12/21 1605  Weight: 99.6 kg 102.7 kg 99.5 kg    Intake/Output Summary (Last 24 hours) at 02/13/2021 0853 Last data filed at 02/12/2021 2200 Gross per 24 hour  Intake 1140 ml  Output 3000 ml  Net -1860 ml    Additional Objective Labs: Basic Metabolic Panel: Recent Labs  Lab 02/08/21 0649 02/10/21 0106 02/12/21 1304  NA 129* 133* 134*  K 4.3 4.2 3.7  CL 93* 93* 91*  CO2 '23 25 29  '$ GLUCOSE 108* 125* 131*  BUN 72* 58* 53*  CREATININE 13.09* 11.78* 12.81*  CALCIUM 10.0 10.4* 10.2  PHOS 6.3*  --  5.1*   Liver Function Tests: Recent Labs  Lab 02/08/21 0649 02/12/21 1304  ALBUMIN 3.0* 3.2*   CBC: Recent Labs  Lab 02/08/21 0649 02/10/21 0106 02/12/21 1351  WBC 5.7 5.3 5.5  NEUTROABS  --  3.2  --   HGB 8.4* 9.1* 8.5*  HCT 27.1* 27.7* 28.3*  MCV 96.4 94.5 99.6  PLT 219 182 202    Medications: . sodium chloride     . carvedilol  25 mg Oral BID WC  . Chlorhexidine Gluconate Cloth  6 each Topical Q0600  . cloNIDine  0.3 mg Oral TID  . darbepoetin (ARANESP) injection - DIALYSIS  150 mcg Intravenous Q Mon-HD  . heparin  5,000 Units Subcutaneous Q8H  . lidocaine-prilocaine  1 application Topical Q M,W,F-HD  . multivitamin  1 tablet Oral QHS  .  senna  1 tablet Oral BID  . sevelamer carbonate  3,200 mg Oral TID WC  . sodium chloride flush  3 mL Intravenous Q12H    Dialysis Orders: [facility] 4h 51mn 2/2.5 bath 105kg AVF Heparin ? parsabiv 15 mg three times a week with HD mircera 150 mcg every 2 weeks - last given 01/13/21 hectorol 3 mcg three times a week  Assessment/Plan: 1. End-stage renal disease - on MWF. Tolerating dialysis well. Next HD 2/21.  2. 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  3. Left breast abscess- s/p I&D, completed course of Augmentin. per primary  4. covid-19 infection-resolved,off of isolation. 5. HTN- BP in goal. Does not appear volume overloaded on exam. Continue current medications and UF with HD as tolerated. Will need EDW lowered at discharge.  Last weight 99.6kg. 6. Anemia CKD - Hgb 8.5 .Aranesp increased to1543m weekly,iron saturation 34%, ferritin 1976, no indication for Fe supplementation. 7. Metabolic bone disease- Phos improving.  continue binders, may need to increase if continues to run high.  . Corrected calcium high, hectorol on hold, use low  Ca bath. Not able to get parsabiv here.  8. Social Issues:AwaitingALF.  SW working with her.     Lynnda Child PA-C [facility] 02/13/2021,8:53 AM

## 2021-02-14 DIAGNOSIS — U071 COVID-19: Secondary | ICD-10-CM | POA: Diagnosis not present

## 2021-02-14 DIAGNOSIS — N186 End stage renal disease: Secondary | ICD-10-CM | POA: Diagnosis not present

## 2021-02-14 DIAGNOSIS — N611 Abscess of the breast and nipple: Secondary | ICD-10-CM | POA: Diagnosis not present

## 2021-02-14 NOTE — Progress Notes (Signed)
[facility] Progress Note   Subjective:   No new events. No complaints this am.    Objective Vitals:   02/13/21 0900 02/13/21 1645 02/13/21 2107 02/14/21 0816  BP: (!) 148/88 (!) 152/82 125/61 102/90  Pulse: 62 66 74   Resp: '20 18 18   '$ Temp: 98.1 F (36.7 C) 98 F (36.7 C) (!) 97.4 F (36.3 C)   TempSrc: Oral Oral Oral   SpO2: 100% 100% 100%   Weight:      Height:       Physical Exam General:chronically ill appearing female in NAD Heart:RRR, no mrg Lungs:CTAB, nml WOB on RA Abdomen:soft, NTND Extremities:no LE edema Dialysis Access: LU AVF +b/t   Filed Weights   02/10/21 1110 02/12/21 1250 02/12/21 1605  Weight: 99.6 kg 102.7 kg 99.5 kg    Intake/Output Summary (Last 24 hours) at 02/14/2021 0928 Last data filed at 02/14/2021 0815 Gross per 24 hour  Intake 937 ml  Output -  Net 937 ml    Additional Objective Labs: Basic Metabolic Panel: Recent Labs  Lab 02/08/21 0649 02/10/21 0106 02/12/21 1304  NA 129* 133* 134*  K 4.3 4.2 3.7  CL 93* 93* 91*  CO2 '23 25 29  '$ GLUCOSE 108* 125* 131*  BUN 72* 58* 53*  CREATININE 13.09* 11.78* 12.81*  CALCIUM 10.0 10.4* 10.2  PHOS 6.3*  --  5.1*   Liver Function Tests: Recent Labs  Lab 02/08/21 0649 02/12/21 1304  ALBUMIN 3.0* 3.2*   CBC: Recent Labs  Lab 02/08/21 0649 02/10/21 0106 02/12/21 1351  WBC 5.7 5.3 5.5  NEUTROABS  --  3.2  --   HGB 8.4* 9.1* 8.5*  HCT 27.1* 27.7* 28.3*  MCV 96.4 94.5 99.6  PLT 219 182 202    Medications: . sodium chloride     . carvedilol  25 mg Oral BID WC  . Chlorhexidine Gluconate Cloth  6 each Topical Q0600  . cloNIDine  0.3 mg Oral TID  . darbepoetin (ARANESP) injection - DIALYSIS  150 mcg Intravenous Q Mon-HD  . heparin  5,000 Units Subcutaneous Q8H  . lidocaine-prilocaine  1 application Topical Q M,W,F-HD  . multivitamin  1 tablet Oral QHS  . senna  1 tablet Oral BID  . sevelamer carbonate  3,200 mg Oral TID WC  . sodium chloride flush  3 mL  Intravenous Q12H    Dialysis Orders: [place] 4h 3mn 2/2.5 bath 105kg AVF Heparin ? parsabiv 15 mg three times a week with HD mircera 150 mcg every 2 weeks - last given 01/13/21 hectorol 3 mcg three times a week  Assessment/Plan: 1. End-stage renal disease - on MWF. Tolerating dialysis well. Next HD 2/21.  2. 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  3. Left breast abscess- s/p I&D, completed course of Augmentin. per primary  4. covid-19 infection-resolved,off of isolation. 5. HTN- BP in goal. Does not appear volume overloaded on exam. Continue current medications and UF with HD as tolerated. Will need EDW lowered at discharge.  Last weight 99.6kg. 6. Anemia CKD - Hgb 8.5 .Aranesp increased to1587m weekly,iron saturation 34%, ferritin 1976, no indication for Fe supplementation. 7. Metabolic bone disease- Phos improving.  continue binders, may need to increase if continues to run high.  . Corrected calcium high, hectorol on hold, use low Ca bath. Not able to get parsabiv here.  8. Social Issues:AwaitingALF.  SW working with her.     Lynnda Child PA-C [facility] 02/14/2021,9:28 AM

## 2021-02-14 NOTE — Plan of Care (Signed)
  Problem: Pain Managment: Goal: General experience of comfort will improve Outcome: Progressing   

## 2021-02-14 NOTE — Progress Notes (Signed)
[facility]  PROGRESS NOTE  Rebekah Burton [id number] DOB: December 26, 1976 DOA: 01/15/2021 PCP: Trey Sailors, PA   Brief HPI:   45 year old female with ESRD on HD MWF, blindness, hypertension, PAD came to hospital with breast abscess.  General surgery was consulted.  She underwent incision and drainage.  She was also found to be incidental Covid positive.  Now she is off contact precautions.  Initially she was started on IV antibiotics which was transitioned to Augmentin.  Patient has completed course of antibiotics.  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  IR and vascular were consulted.  aVF is now working.  Dialysis per nephrology.    Subjective   Patient seen and examined, denies any complaints.   Assessment/Plan:     1. Breast abscess-s/p incision drainage done by general surgery.  She was started on Unasyn, pulmonary cultures grew gram-positive cocci.  She was transitioned to Augmentin and has completed the course. 2. Incidental COVID-19-patient is atraumatic, no evidence of pulmonary involvement.  Isolation was discontinued on 01/19/2021.  Currently on room air. 3. ESRD-nephrology was consulted, had issues with cannulation of aVF.  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  aVF is not working.  Dialysis per nephrology.  Continue sevelamer. 4. Anemia of chronic disease-monitor intermittently, hemoglobin is stable at 9.1.  Continue Aranesp. 5. Hyponatremia-patient on hemodialysis, nephrology following. 6. Hypertension-continue clonidine, Coreg. 7. Disposition-patient was doing with friend before but after she was diagnosed with COVID-19 she did not want patient to come back.  Patient then decided to go with boyfriend in emergency room, process was started to move her Medicaid/dialysis from [facility] to [facility] and  process was anticipated to be completed by 01/26/2021.  However patient then decided not to go and live with her boyfriend anymore.  TOC has been consulted and trying to find a place for her.     COVID-19 Labs  No results for input(s): DDIMER, FERRITIN, LDH, CRP in the last 72 hours.  Lab Results  Component Value Date   SARSCOV2NAA NEGATIVE 01/18/2021   SARSCOV2NAA NEGATIVE 01/15/2021   SARSCOV2NAA POSITIVE (A) 01/15/2021     Scheduled medications:   . carvedilol  25 mg Oral BID WC  . Chlorhexidine Gluconate Cloth  6 each Topical Q0600  . cloNIDine  0.3 mg Oral TID  . darbepoetin (ARANESP) injection - DIALYSIS  150 mcg Intravenous Q Mon-HD  . heparin  5,000 Units Subcutaneous Q8H  . lidocaine-prilocaine  1 application Topical Q M,W,F-HD  . multivitamin  1 tablet Oral QHS  . senna  1 tablet Oral BID  . sevelamer carbonate  3,200 mg Oral TID WC  . sodium chloride flush  3 mL Intravenous Q12H     SpO2: 99 % O2 Flow Rate (L/min): 2 L/min    CBC: Recent Labs  Lab 02/08/21 0649 02/10/21 0106 02/12/21 1351  WBC 5.7 5.3 5.5  NEUTROABS  --  3.2  --   HGB 8.4* 9.1* 8.5*  HCT 27.1* 27.7* 28.3*  MCV 96.4 94.5 99.6  PLT 219 182 [id number]    Basic Metabolic Panel: Recent Labs  Lab 02/08/21 0649 02/10/21 0106 02/12/21 1304  NA 129* 133* 134*  K 4.3 4.2 3.7  CL 93* 93* 91*  CO2 '23 25 29  '$ GLUCOSE 108* 125* 131*  BUN 72* 58* 53*  CREATININE 13.09* 11.78* 12.81*  CALCIUM 10.0 10.4*  10.2  PHOS 6.3*  --  5.1*     Liver Function Tests: Recent Labs  Lab 02/08/21 0649 02/12/21 1304  ALBUMIN 3.0* 3.2*     Antibiotics: Anti-infectives (From admission, onward)   Start     Dose/Rate Route Frequency Ordered Stop   01/23/21 1400  amoxicillin-clavulanate (AUGMENTIN) 500-125 MG per tablet 500 mg        500 mg Oral Every 24 hours 01/23/21 1124 01/25/21 1657   01/23/21 1015  amoxicillin-clavulanate (AUGMENTIN) 875-125 MG per tablet 1 tablet  Status:  Discontinued        1  tablet Oral Every 12 hours 01/23/21 1002 01/23/21 1124   01/20/21 0845  vancomycin (VANCOCIN) 1-5 GM/200ML-% IVPB       Note to Pharmacy: Judieth Keens  : cabinet override      01/20/21 0845 01/20/21 2059   01/18/21 1200  vancomycin (VANCOCIN) IVPB 1000 mg/200 mL premix  Status:  Discontinued        1,000 mg 200 mL/hr over 60 Minutes Intravenous Every M-W-F (Hemodialysis) 01/16/21 1017 01/21/21 1324   01/17/21 2200  Ampicillin-Sulbactam (UNASYN) 3 g in sodium chloride 0.9 % 100 mL IVPB  Status:  Discontinued        3 g 200 mL/hr over 30 Minutes Intravenous Daily at bedtime 01/16/21 1058 01/23/21 1002   01/17/21 1000  remdesivir 100 mg in sodium chloride 0.9 % 100 mL IVPB       "Followed by" Linked Group Details   100 mg 200 mL/hr over 30 Minutes Intravenous Daily 01/15/21 2357 01/18/21 0918   01/16/21 1145  Ampicillin-Sulbactam (UNASYN) 3 g in sodium chloride 0.9 % 100 mL IVPB        3 g 200 mL/hr over 30 Minutes Intravenous  Once 01/16/21 1058 01/16/21 1417   01/16/21 1100  vancomycin (VANCOREADY) IVPB 1500 mg/300 mL        1,500 mg 150 mL/hr over 120 Minutes Intravenous  Once 01/16/21 1017 01/16/21 1849   01/16/21 0300  remdesivir 200 mg in sodium chloride 0.9% 250 mL IVPB       "Followed by" Linked Group Details   200 mg 580 mL/hr over 30 Minutes Intravenous Once 01/15/21 2357 01/16/21 0543   01/15/21 2345  amoxicillin-clavulanate (AUGMENTIN) 875-125 MG per tablet 1 tablet  Status:  Discontinued        1 tablet Oral Every 12 hours 01/15/21 2334 01/15/21 2357   01/15/21 1800  vancomycin (VANCOCIN) IVPB 1000 mg/200 mL premix        1,000 mg 200 mL/hr over 60 Minutes Intravenous  Once 01/15/21 1753 01/15/21 2120       DVT prophylaxis: Heparin  Code Status: Full code  Family Communication: No family at bedside   Consultants:  Nephrology  General surgery  Procedures:  I &D of breast abscess    Objective   Vitals:   02/13/21 1645 02/13/21 2107 02/14/21 0816  02/14/21 0959  BP: (!) 152/82 125/61 102/90 (!) 163/72  Pulse: 66 74  (!) 57  Resp: '18 18  18  '$ Temp: 98 F (36.7 C) (!) 97.4 F (36.3 C)  (!) 97.5 F (36.4 C)  TempSrc: Oral Oral  Oral  SpO2: 100% 100%  99%  Weight:      Height:        Intake/Output Summary (Last 24 hours) at 02/14/2021 1258 Last data filed at 02/14/2021 0815 Gross per 24 hour  Intake 937 ml  Output -  Net 937 ml  02/18 1901 - 02/20 0700 In: 942 [P.O.:942] Out: -   Filed Weights   02/10/21 1110 02/12/21 1250 02/12/21 1605  Weight: 99.6 kg 102.7 kg 99.5 kg    Physical Examination:   General-appears in no acute distress Heart-S1-S2, regular, no murmur auscultated Lungs-clear to auscultation bilaterally, no wheezing or crackles auscultated Abdomen-soft, nontender, no organomegaly Extremities-no edema in the lower extremities Neuro-alert, oriented x3, no focal deficit noted  Status is: Inpatient  Dispo: The patient is from: Home              Anticipated d/c is to: Home              Anticipated d/c date is: 02/15/2021              Patient currently medically stable for discharge  Barrier to discharge-awaiting place for safe discharge       Data Reviewed:   No results found for this or any previous visit (from the past 240 hour(s)).  No results for input(s): LIPASE, AMYLASE in the last 168 hours. No results for input(s): AMMONIA in the last 168 hours.  Cardiac Enzymes: No results for input(s): CKTOTAL, CKMB, CKMBINDEX, TROPONINI in the last 168 hours. BNP (last 3 results) Recent Labs    01/18/21 0136 01/19/21 0030 01/20/21 0020  BNP 1,410.7* 1,122.4* 1,383.7*     Oswald Hillock   [facility] If 7PM-7AM, please contact night-coverage at [email or web address], Office  [phone]   02/14/2021, 12:58 PM  LOS: 30 days

## 2021-02-15 DIAGNOSIS — N186 End stage renal disease: Secondary | ICD-10-CM | POA: Diagnosis not present

## 2021-02-15 DIAGNOSIS — U071 COVID-19: Secondary | ICD-10-CM | POA: Diagnosis not present

## 2021-02-15 DIAGNOSIS — N611 Abscess of the breast and nipple: Secondary | ICD-10-CM | POA: Diagnosis not present

## 2021-02-15 LAB — CBC
HCT: 30 % — ABNORMAL LOW (ref 36.0–46.0)
Hemoglobin: 9.1 g/dL — ABNORMAL LOW (ref 12.0–15.0)
MCH: 30.1 pg (ref 26.0–34.0)
MCHC: 30.3 g/dL (ref 30.0–36.0)
MCV: 99.3 fL (ref 80.0–100.0)
Platelets: 194 10*3/uL (ref 150–400)
RBC: 3.02 MIL/uL — ABNORMAL LOW (ref 3.87–5.11)
RDW: 18.5 % — ABNORMAL HIGH (ref 11.5–15.5)
WBC: 6.6 10*3/uL (ref 4.0–10.5)
nRBC: 0 % (ref 0.0–0.2)

## 2021-02-15 NOTE — Progress Notes (Signed)
[facility]  PROGRESS NOTE  Rebekah Burton [id number] DOB: 08/05/1976 DOA: 01/15/2021 PCP: Trey Sailors, PA   Brief HPI:   45 year old female with ESRD on HD MWF, blindness, hypertension, PAD came to hospital with breast abscess.  General surgery was consulted.  She underwent incision and drainage.  She was also found to be incidental Covid positive.  Now she is off contact precautions.  Initially she was started on IV antibiotics which was transitioned to Augmentin.  Patient has completed course of antibiotics.  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  IR and vascular were consulted.  aVF is now working.  Dialysis per nephrology.    Subjective   Patient seen and examined, denies any new complaints.  Awaiting place for safe discharge.   Assessment/Plan:     1. Breast abscess-s/p incision drainage done by general surgery.  She was started on Unasyn, pulmonary cultures grew gram-positive cocci.  She was transitioned to Augmentin and has completed the course. 2. Incidental COVID-19-patient is atraumatic, no evidence of pulmonary involvement.  Isolation was discontinued on 01/19/2021.  Currently on room air. 3. ESRD-nephrology was consulted, had issues with cannulation of aVF.  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  aVF is not working.  Dialysis per nephrology.  Continue sevelamer. 4. Anemia of chronic disease-monitor intermittently, hemoglobin is stable at 9.1.  Continue Aranesp. 5. Hyponatremia-patient on hemodialysis, nephrology following. 6. Hypertension-continue clonidine, Coreg. 7. Disposition-patient was doing with friend before but after she was diagnosed with COVID-19 she did not want patient to come back.  Patient then decided to go with boyfriend in emergency room, process was started to move her Medicaid/dialysis from  [facility] to [facility] and process was anticipated to be completed by 01/26/2021.  However patient then decided not to go and live with her boyfriend anymore.  TOC has been consulted and trying to find a place for her.     COVID-19 Labs  No results for input(s): DDIMER, FERRITIN, LDH, CRP in the last 72 hours.  Lab Results  Component Value Date   SARSCOV2NAA NEGATIVE 01/18/2021   SARSCOV2NAA NEGATIVE 01/15/2021   SARSCOV2NAA POSITIVE (A) 01/15/2021     Scheduled medications:   . carvedilol  25 mg Oral BID WC  . Chlorhexidine Gluconate Cloth  6 each Topical Q0600  . cloNIDine  0.3 mg Oral TID  . darbepoetin (ARANESP) injection - DIALYSIS  150 mcg Intravenous Q Mon-HD  . heparin  5,000 Units Subcutaneous Q8H  . lidocaine-prilocaine  1 application Topical Q M,W,F-HD  . multivitamin  1 tablet Oral QHS  . senna  1 tablet Oral BID  . sevelamer carbonate  3,200 mg Oral TID WC  . sodium chloride flush  3 mL Intravenous Q12H     SpO2: 99 % O2 Flow Rate (L/min): 2 L/min    CBC: Recent Labs  Lab 02/10/21 0106 02/12/21 1351 02/15/21 0552  WBC 5.3 5.5 6.6  NEUTROABS 3.2  --   --   HGB 9.1* 8.5* 9.1*  HCT 27.7* 28.3* 30.0*  MCV 94.5 99.6 99.3  PLT 182 202 [id number]    Basic Metabolic Panel: Recent Labs  Lab 02/10/21 0106 02/12/21 1304  NA 133* 134*  K 4.2 3.7  CL 93* 91*  CO2 25 29  GLUCOSE 125* 131*  BUN 58* 53*  CREATININE 11.78* 12.81*  CALCIUM 10.4* 10.2  PHOS  --  5.1*     Liver Function Tests: Recent Labs  Lab 02/12/21 1304  ALBUMIN 3.2*     Antibiotics: Anti-infectives (From admission, onward)   Start     Dose/Rate Route Frequency Ordered Stop   01/23/21 1400  amoxicillin-clavulanate (AUGMENTIN) 500-125 MG per tablet 500 mg        500 mg Oral Every 24 hours 01/23/21 1124 01/25/21 1657   01/23/21 1015  amoxicillin-clavulanate (AUGMENTIN) 875-125 MG per tablet 1 tablet  Status:  Discontinued        1 tablet Oral Every 12 hours 01/23/21 1002  01/23/21 1124   01/20/21 0845  vancomycin (VANCOCIN) 1-5 GM/200ML-% IVPB       Note to Pharmacy: Judieth Keens  : cabinet override      01/20/21 0845 01/20/21 2059   01/18/21 1200  vancomycin (VANCOCIN) IVPB 1000 mg/200 mL premix  Status:  Discontinued        1,000 mg 200 mL/hr over 60 Minutes Intravenous Every M-W-F (Hemodialysis) 01/16/21 1017 01/21/21 1324   01/17/21 2200  Ampicillin-Sulbactam (UNASYN) 3 g in sodium chloride 0.9 % 100 mL IVPB  Status:  Discontinued        3 g 200 mL/hr over 30 Minutes Intravenous Daily at bedtime 01/16/21 1058 01/23/21 1002   01/17/21 1000  remdesivir 100 mg in sodium chloride 0.9 % 100 mL IVPB       "Followed by" Linked Group Details   100 mg 200 mL/hr over 30 Minutes Intravenous Daily 01/15/21 2357 01/18/21 0918   01/16/21 1145  Ampicillin-Sulbactam (UNASYN) 3 g in sodium chloride 0.9 % 100 mL IVPB        3 g 200 mL/hr over 30 Minutes Intravenous  Once 01/16/21 1058 01/16/21 1417   01/16/21 1100  vancomycin (VANCOREADY) IVPB 1500 mg/300 mL        1,500 mg 150 mL/hr over 120 Minutes Intravenous  Once 01/16/21 1017 01/16/21 1849   01/16/21 0300  remdesivir 200 mg in sodium chloride 0.9% 250 mL IVPB       "Followed by" Linked Group Details   200 mg 580 mL/hr over 30 Minutes Intravenous Once 01/15/21 2357 01/16/21 0543   01/15/21 2345  amoxicillin-clavulanate (AUGMENTIN) 875-125 MG per tablet 1 tablet  Status:  Discontinued        1 tablet Oral Every 12 hours 01/15/21 2334 01/15/21 2357   01/15/21 1800  vancomycin (VANCOCIN) IVPB 1000 mg/200 mL premix        1,000 mg 200 mL/hr over 60 Minutes Intravenous  Once 01/15/21 1753 01/15/21 2120       DVT prophylaxis: Heparin  Code Status: Full code  Family Communication: No family at bedside   Consultants:  Nephrology  General surgery  Procedures:  I &D of breast abscess    Objective   Vitals:   02/14/21 1730 02/14/21 2108 02/15/21 0520 02/15/21 1008  BP: (!) 145/89 (!) 156/81  137/73 (!) 157/67  Pulse: 70 62 62 (!) 58  Resp: '20 18 18 18  '$ Temp: (!) 97.3 F (36.3 C) 97.6 F (36.4 C) 97.9 F (36.6 C) 97.9 F (36.6 C)  TempSrc: Oral Oral    SpO2: 99% 98% 100% 99%  Weight:      Height:        Intake/Output Summary (Last 24 hours) at 02/15/2021 1109 Last data filed at 02/15/2021 [id number] Gross per 24 hour  Intake 920 ml  Output 0 ml  Net 920 ml    02/19 1901 - 02/21 0700 In:  1035 [P.O.:1035] Out: 0   Filed Weights   02/10/21 1110 02/12/21 1250 02/12/21 1605  Weight: 99.6 kg 102.7 kg 99.5 kg    Physical Examination:   General-appears in no acute distress Heart-S1-S2, regular, no murmur auscultated Lungs-clear to auscultation bilaterally, no wheezing or crackles auscultated Abdomen-soft, nontender, no organomegaly Extremities-no edema in the lower extremities Neuro-alert, oriented x3, no focal deficit noted  Status is: Inpatient  Dispo: The patient is from: Home              Anticipated d/c is to: Home              Anticipated d/c date is: 02/18/2021              Patient currently medically stable for discharge  Barrier to discharge-awaiting place for safe discharge       Data Reviewed:   No results found for this or any previous visit (from the past 240 hour(s)).  No results for input(s): LIPASE, AMYLASE in the last 168 hours. No results for input(s): AMMONIA in the last 168 hours.  Cardiac Enzymes: No results for input(s): CKTOTAL, CKMB, CKMBINDEX, TROPONINI in the last 168 hours. BNP (last 3 results) Recent Labs    01/18/21 0136 01/19/21 0030 01/20/21 0020  BNP 1,410.7* 1,122.4* 1,383.7*     Oswald Hillock   [facility] If 7PM-7AM, please contact night-coverage at [email or web address], Office  [phone]   02/15/2021, 11:09 AM  LOS: 31 days

## 2021-02-15 NOTE — Progress Notes (Signed)
Called by [facility] that they cannot accessed pt.'s Left AV fistula so they are sending her back to her room. They will try to hemodialyze her in the morning. Pt ushered back to room and made her comfortablyin bed, VS taken.no complaints at this time

## 2021-02-15 NOTE — TOC Progression Note (Signed)
Transition of Care [facility]) - Progression Note    Patient Details  Name: Jaquette Regier MRN: [id number] Date of Birth: 04/24/1976  Transition of Care [facility]) CM/SW Contact  Bartholomew Crews, RN Phone Number: [phone] 02/15/2021, 5:01 PM  Clinical Narrative:     Calls made to several family care homes.  [place] - disconnected [place] #2 - no vacancies [employer] - no vacancies [place] #2 - left vm [place] - no vancancies [place] - "call could not go through" [place] [phone] [facility] faxed to [phone] for consideration, in process of screening several candidates - advised of f/u tomorrow [facility] - no vacancies [facility] - call cannot be completed as dialed [facility] [place] - number [phone] has been changed to an unknown number [facility] - no vacancies [place] - no vacancies [place] - disconnected [place] for [employer] was full [employer] - left [place] - call cannot be completed as dialed [employer] - no vacancies  Expected Discharge Plan: [place] [facility] Barriers to Discharge: Other (comment) (homeless - looking for resorces in [facility])  Expected Discharge Plan and Services Expected Discharge Plan: [place]   Discharge Planning Services: CM Consult Post Acute Care Choice: [place] arrangements for the past 2 months: Single Family Home                 DME Arranged: N/A DME Agency: NA       HH Arranged: RN [place] Agency: [facility] Date HH Agency Contacted: 01/21/21 Time [place]: [id number] Representative spoke with at [place]: [place] ([place]) Interventions    Readmission Risk Interventions No flowsheet data found.

## 2021-02-15 NOTE — Progress Notes (Signed)
Rebekah Burton Progress Note   Subjective:   Seen at bedside. No overnight events. Comfortable. No cp, sob. For dialysis today.    Objective Vitals:   02/14/21 0959 02/14/21 1730 02/14/21 2108 02/15/21 0520  BP: (!) 163/72 (!) 145/89 (!) 156/81 137/73  Pulse: (!) 57 70 62 62  Resp: '18 20 18 18  '$ Temp: (!) 97.5 F (36.4 C) (!) 97.3 F (36.3 C) 97.6 F (36.4 C) 97.9 F (36.6 C)  TempSrc: Oral Oral Oral   SpO2: 99% 99% 98% 100%  Weight:      Height:       Physical Exam General:chronically ill appearing female in NAD Heart:RRR, no mrg Lungs:CTAB, nml WOB on RA Abdomen:soft, NTND Extremities:no LE edema Dialysis Access: LU AVF +b/t   Filed Weights   02/10/21 1110 02/12/21 1250 02/12/21 1605  Weight: 99.6 kg 102.7 kg 99.5 kg    Intake/Output Summary (Last 24 hours) at 02/15/2021 0921 Last data filed at 02/15/2021 [id number] Gross per 24 hour  Intake 920 ml  Output 0 ml  Net 920 ml    Additional Objective Labs: Basic Metabolic Panel: Recent Labs  Lab 02/10/21 0106 02/12/21 1304  NA 133* 134*  K 4.2 3.7  CL 93* 91*  CO2 25 29  GLUCOSE 125* 131*  BUN 58* 53*  CREATININE 11.78* 12.81*  CALCIUM 10.4* 10.2  PHOS  --  5.1*   Liver Function Tests: Recent Labs  Lab 02/12/21 1304  ALBUMIN 3.2*   CBC: Recent Labs  Lab 02/10/21 0106 02/12/21 1351 02/15/21 0552  WBC 5.3 5.5 6.6  NEUTROABS 3.2  --   --   HGB 9.1* 8.5* 9.1*  HCT 27.7* 28.3* 30.0*  MCV 94.5 99.6 99.3  PLT 182 202 194    Medications: . sodium chloride     . carvedilol  25 mg Oral BID WC  . Chlorhexidine Gluconate Cloth  6 each Topical Q0600  . cloNIDine  0.3 mg Oral TID  . darbepoetin (ARANESP) injection - DIALYSIS  150 mcg Intravenous Q Mon-HD  . heparin  5,000 Units Subcutaneous Q8H  . lidocaine-prilocaine  1 application Topical Q M,W,F-HD  . multivitamin  1 tablet Oral QHS  . senna  1 tablet Oral BID  . sevelamer carbonate  3,200 mg Oral TID WC  . sodium chloride flush  3  mL Intravenous Q12H    Dialysis Orders: [place] 4h 11mn 2/2.5 bath 105kg AVF Heparin ? parsabiv 15 mg three times a week with HD mircera 150 mcg every 2 weeks - last given 01/13/21 hectorol 3 mcg three times a week  Assessment/Plan: 1. End-stage renal disease - on MWF. Tolerating dialysis well. Next HD 2/21.  2. 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  3. Left breast abscess- s/p I&D, completed course of Augmentin. per primary  4. covid-19 infection-resolved,off of isolation. 5. HTN- BP in goal. Does not appear volume overloaded on exam. Continue current medications and UF with HD as tolerated. Will need EDW lowered at discharge.  6. Anemia CKD - Hgb 9.1 .Aranesp increased to1580m weekly,iron saturation 34%, ferritin 1976, no indication for Fe supplementation. 7. Metabolic bone disease- Phos improving.  continue binders.  Corrected calcium high, hectorol on hold, use low Ca bath. Not able to get parsabiv here.  8. Social Issues:AwaitingALF placement. SW involved.    OgLynnda Burton [name] 02/15/2021,9:21  AM    

## 2021-02-15 NOTE — Plan of Care (Signed)
  Problem: Health Behavior/Discharge Planning: Goal: Ability to manage health-related needs will improve Outcome: Progressing   Problem: Pain Managment: Goal: General experience of comfort will improve Outcome: Progressing   Problem: Safety: Goal: Ability to remain free from injury will improve Outcome: Progressing   

## 2021-02-16 DIAGNOSIS — N611 Abscess of the breast and nipple: Secondary | ICD-10-CM | POA: Diagnosis not present

## 2021-02-16 DIAGNOSIS — N186 End stage renal disease: Secondary | ICD-10-CM | POA: Diagnosis not present

## 2021-02-16 LAB — RENAL FUNCTION PANEL
Albumin: 3.3 g/dL — ABNORMAL LOW (ref 3.5–5.0)
Anion gap: 16 — ABNORMAL HIGH (ref 5–15)
BUN: 76 mg/dL — ABNORMAL HIGH (ref 6–20)
CO2: 25 mmol/L (ref 22–32)
Calcium: 10.3 mg/dL (ref 8.9–10.3)
Chloride: 91 mmol/L — ABNORMAL LOW (ref 98–111)
Creatinine, Ser: 16.11 mg/dL — ABNORMAL HIGH (ref 0.44–1.00)
GFR, Estimated: 3 mL/min — ABNORMAL LOW (ref >60)
Glucose, Bld: 128 mg/dL — ABNORMAL HIGH (ref 70–99)
Phosphorus: 7 mg/dL — ABNORMAL HIGH (ref 2.5–4.6)
Potassium: 4.2 mmol/L (ref 3.5–5.1)
Sodium: 132 mmol/L — ABNORMAL LOW (ref 135–145)

## 2021-02-16 LAB — CBC
HCT: 29.8 % — ABNORMAL LOW (ref 36.0–46.0)
HCT: 30.1 % — ABNORMAL LOW (ref 36.0–46.0)
Hemoglobin: 9.3 g/dL — ABNORMAL LOW (ref 12.0–15.0)
Hemoglobin: 9.7 g/dL — ABNORMAL LOW (ref 12.0–15.0)
MCH: 30.2 pg (ref 26.0–34.0)
MCH: 31.3 pg (ref 26.0–34.0)
MCHC: 31.2 g/dL (ref 30.0–36.0)
MCHC: 32.2 g/dL (ref 30.0–36.0)
MCV: 96.8 fL (ref 80.0–100.0)
MCV: 97.1 fL (ref 80.0–100.0)
Platelets: 205 10*3/uL (ref 150–400)
Platelets: 188 10*3/uL (ref 150–400)
RBC: 3.08 MIL/uL — ABNORMAL LOW (ref 3.87–5.11)
RBC: 3.1 MIL/uL — ABNORMAL LOW (ref 3.87–5.11)
RDW: 18.3 % — ABNORMAL HIGH (ref 11.5–15.5)
RDW: 18.7 % — ABNORMAL HIGH (ref 11.5–15.5)
WBC: 7.9 10*3/uL (ref 4.0–10.5)
WBC: 7.5 10*3/uL (ref 4.0–10.5)
nRBC: 0 % (ref 0.0–0.2)
nRBC: 0 % (ref 0.0–0.2)

## 2021-02-16 MED ORDER — LIDOCAINE HCL (PF) 1 % IJ SOLN: 5.0000 mL | INTRAMUSCULAR | Status: DC | PRN

## 2021-02-16 MED ORDER — PENTAFLUOROPROP-TETRAFLUOROETH EX AERO: 1.0000 "application " | INHALATION_SPRAY | CUTANEOUS | Status: DC | PRN

## 2021-02-16 MED ORDER — SODIUM CHLORIDE 0.9 % IV SOLN: 100.0000 mL | INTRAVENOUS | Status: DC | PRN

## 2021-02-16 MED ORDER — HEPARIN SODIUM (PORCINE) 1000 UNIT/ML DIALYSIS: 1000.0000 [IU] | INTRAMUSCULAR | Status: DC | PRN

## 2021-02-16 MED ORDER — DARBEPOETIN ALFA 150 MCG/0.3ML IJ SOSY
PREFILLED_SYRINGE | INTRAMUSCULAR | Status: AC
  Filled 2021-02-16: qty 0.3

## 2021-02-16 MED ORDER — LIDOCAINE-PRILOCAINE 2.5-2.5 % EX CREA: 1.0000 "application " | TOPICAL_CREAM | CUTANEOUS | Status: DC | PRN

## 2021-02-16 MED ORDER — HEPARIN SODIUM (PORCINE) 1000 UNIT/ML DIALYSIS: 20.0000 [IU]/kg | INTRAMUSCULAR | Status: DC | PRN

## 2021-02-16 MED ORDER — ALTEPLASE 2 MG IJ SOLR: 2.0000 mg | Freq: Once | INTRAMUSCULAR | Status: DC | PRN

## 2021-02-16 NOTE — Progress Notes (Signed)
[facility] Progress Note   Subjective:     Rebekah Burton was seen and examined at bedside. Per patient, hemodialysis not completed yesterday 02/15/21 d/t difficulty with cannulation. Per previous notes, AVF infiltrated on 02/10/21. 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 Patient appears comfortable. She denies shortness of breath, chest pain, N/V/D, ABD pain, and swelling of arms and legs. Will try for hemodialysis again today.   Objective Vitals:   02/15/21 1646 02/15/21 2033 02/16/21 0535 02/16/21 1003  BP: (!) 144/73 (!) 133/100 138/81 (!) 147/99  Pulse: 62 65 64 66  Resp: '18 18 16 18  '$ Temp:  98 F (36.7 C) 97.9 F (36.6 C) 98 F (36.7 C)  TempSrc:   Oral   SpO2:  99% 99% 96%  Weight:      Height:       Physical Exam General: Appears comfortable, in no acute respiratory distress Heart: Normal S1 and S2; No murmurs, gallops, or friction rub Lungs: Clear throughout w/o wheezing, rhonchi, or rales Abdomen: soft, round, non-tender, active bowel sounds Extremities: no edema noted upper/lower extremities or bilateral hips Dialysis Access: L AVF (+) Bruit/Thrill  Filed Weights   02/10/21 1110 02/12/21 1250 02/12/21 1605  Weight: 99.6 kg 102.7 kg 99.5 kg    Intake/Output Summary (Last 24 hours) at 02/16/2021 1225 Last data filed at 02/16/2021 0813 Gross per 24 hour  Intake 1200 ml  Output 120 ml  Net 1080 ml    Additional Objective Labs: Basic Metabolic Panel: Recent Labs  Lab 02/10/21 0106 02/12/21 1304  NA 133* 134*  K 4.2 3.7  CL 93* 91*  CO2 25 29  GLUCOSE 125* 131*  BUN 58* 53*  CREATININE 11.78* 12.81*  CALCIUM 10.4* 10.2  PHOS  --  5.1*   Liver Function Tests: Recent Labs  Lab 02/12/21 1304  ALBUMIN 3.2*   CBC: Recent Labs  Lab 02/10/21 0106 02/12/21 1351 02/15/21 0552  WBC 5.3 5.5 6.6  NEUTROABS 3.2  --   --   HGB 9.1* 8.5* 9.1*  HCT 27.7* 28.3*  30.0*  MCV 94.5 99.6 99.3  PLT 182 202 194   Blood Culture    Component Value Date/Time   SDES ABSCESS LEFT BREAST 01/16/2021 0009   SPECREQUEST NONE 01/16/2021 0009   CULT  01/16/2021 0009    MIXED ANAEROBIC FLORA PRESENT.  CALL LAB IF FURTHER IID REQUIRED.   REPTSTATUS 01/21/2021 FINAL 01/16/2021 0009    Medications: . sodium chloride    . [START ON 02/17/2021] sodium chloride    . [START ON 02/17/2021] sodium chloride     . carvedilol  25 mg Oral BID WC  . Chlorhexidine Gluconate Cloth  6 each Topical Q0600  . cloNIDine  0.3 mg Oral TID  . darbepoetin (ARANESP) injection - DIALYSIS  150 mcg Intravenous Q Mon-HD  . heparin  5,000 Units Subcutaneous Q8H  . multivitamin  1 tablet Oral QHS  . senna  1 tablet Oral BID  . sevelamer carbonate  3,200 mg Oral TID WC  . sodium chloride flush  3 mL Intravenous Q12H    Dialysis Orders: [place] 4h 5mn 2/2.5 bath 105kg AVF Heparin ? parsabiv 15 mg three times a week with HD mircera 150 mcg every 2 weeks - last given 01/13/21 hectorol 3 mcg three times a week  Assessment/Plan: 1. End-stage renal disease -on MWF.Patient reports hemodialysis was not completed yesterday 02/15/21 d/t difficulty with cannulation. Will try  HD again today. If still experiencing difficulty with cannulation, based on hx of pseudoaneurysm, may have to reach back out to IR for recommendations. 2. 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 3. Left breast abscess- s/p I&D, completed course of Augmentin. per primary  4. covid-19 infection-resolved,off of isolation. 5. HTN-BP in goal. Does not appear volume overloaded on exam.Continue current medications and UF with HD as tolerated. Will need EDW lowered at  discharge.  6. Anemia CKD - Hgb 9.1 .Aranesp increased to120mg weekly,iron saturation 34%, ferritin 1976, no indication for Fe supplementation. 7. Metabolic bone disease- Phos improving.  continue binders.  Corrected calcium high, hectorol on hold, use low Ca bath. Not able to get parsabiv here.  8. Social Issues:AwaitingALF placement. SW involved.    CTobie Poet NP [facility] 02/16/2021,12:25 PM  LOS: 32 days

## 2021-02-16 NOTE — TOC Progression Note (Signed)
Transition of Care [facility]) - Progression Note    Patient Details  Name: Rebekah Burton MRN: [id number] Date of Birth: October 17, 1976  Transition [facility] Care [facility]) CM/SW Contact  Bartholomew Crews, RN Phone Number: [phone] 02/16/2021, 10:56 AM  Clinical Narrative:     Spoke with patient at the bedside. Discussed efforts being made.   Patient has no living family members. No friends in the area.   Patient ready to get out of the hospital. Patient stated that she has been on waiting list with [facility] for 3 years, and continues to be #15. She stated that there is no movement.   Message left with Legrand Como at [facility] about hotel program. Previously no openings.   TOC continuing to follow transition needs.  Expected Discharge Plan: [place] Services Barriers to Discharge: Other (comment) (homeless - looking for resorces in [employer])  Expected Discharge Plan and Services Expected Discharge Plan: [place]   Discharge Planning Services: CM Consult Post Acute Care Choice: [place] arrangements for the past 2 months: Single Family Home                 DME Arranged: N/A DME Agency: NA       HH Arranged: RN [place] Agency: [facility] Date HH Agency Contacted: 01/21/21 Time [place]: [id number] Representative spoke with at [place]: [place] ([place]) Interventions    Readmission Risk Interventions No flowsheet data found.

## 2021-02-16 NOTE — Plan of Care (Signed)
  Problem: Health Behavior/Discharge Planning: Goal: Ability to manage health-related needs will improve 02/16/2021 1025 by Emmaline Life, RN Outcome: Progressing 02/16/2021 1025 by Emmaline Life, RN Outcome: Progressing   Problem: Pain Managment: Goal: General experience of comfort will improve 02/16/2021 1025 by Emmaline Life, RN Outcome: Progressing 02/16/2021 1025 by Emmaline Life, RN Outcome: Progressing

## 2021-02-16 NOTE — Plan of Care (Signed)
  Problem: Health Behavior/Discharge Planning: Goal: Ability to manage health-related needs will improve Outcome: Progressing   Problem: Pain Managment: Goal: General experience of comfort will improve Outcome: Progressing   

## 2021-02-16 NOTE — Progress Notes (Signed)
[facility]  PROGRESS NOTE  Rebekah Burton [id number] DOB: [birth year] DOA: 01/15/2021 PCP: Trey Sailors, PA   Brief HPI:   45 year old female with ESRD on HD MWF, blindness, hypertension, PAD came to hospital with breast abscess.  General surgery was consulted.  She underwent incision and drainage.  She was also found to be incidental Covid positive.  Now she is off contact precautions.  Initially she was started on IV antibiotics which was transitioned to Augmentin.  Patient has completed course of antibiotics.  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  IR and vascular were consulted.  aVF is now working.  Dialysis per nephrology.    Subjective   Patient seen and examined, denies any complaints.  Awaiting safe place for discharge.   Assessment/Plan:     1. Breast abscess-s/p incision drainage done by general surgery.  She was started on Unasyn, pulmonary cultures grew gram-positive cocci.  She was transitioned to Augmentin and has completed the course. 2. Incidental COVID-19-patient is atraumatic, no evidence of pulmonary involvement.  Isolation was discontinued on 01/19/2021.  Currently on room air. 3. ESRD-nephrology was consulted, had issues with cannulation of aVF.  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  aVF is not working.  Dialysis per nephrology.  Continue sevelamer. 4. Anemia of chronic disease-monitor intermittently, hemoglobin is stable at 9.1.  Continue Aranesp. 5. Hyponatremia-patient on hemodialysis, nephrology following. 6. Hypertension-continue clonidine, Coreg. 7. Disposition-patient was doing with friend before but after she was diagnosed with COVID-19 she did not want patient to come back.  Patient then decided to go with boyfriend in emergency room, process was started to move her Medicaid/dialysis from  [facility] to [facility] and process was anticipated to be completed by 01/26/2021.  However patient then decided not to go and live with her boyfriend anymore.  TOC has been consulted and trying to find a place for her.  Awaiting placement for safe discharge.     COVID-19 Labs  No results for input(s): DDIMER, FERRITIN, LDH, CRP in the last 72 hours.  Lab Results  Component Value Date   SARSCOV2NAA NEGATIVE 01/18/2021   SARSCOV2NAA NEGATIVE 01/15/2021   SARSCOV2NAA POSITIVE (A) 01/15/2021     Scheduled medications:   . carvedilol  25 mg Oral BID WC  . Chlorhexidine Gluconate Cloth  6 each Topical Q0600  . cloNIDine  0.3 mg Oral TID  . darbepoetin (ARANESP) injection - DIALYSIS  150 mcg Intravenous Q Mon-HD  . heparin  5,000 Units Subcutaneous Q8H  . multivitamin  1 tablet Oral QHS  . senna  1 tablet Oral BID  . sevelamer carbonate  3,200 mg Oral TID WC  . sodium chloride flush  3 mL Intravenous Q12H     SpO2: 96 % O2 Flow Rate (L/min): 2 L/min    CBC: Recent Labs  Lab 02/10/21 0106 02/12/21 1351 02/15/21 0552  WBC 5.3 5.5 6.6  NEUTROABS 3.2  --   --   HGB 9.1* 8.5* 9.1*  HCT 27.7* 28.3* 30.0*  MCV 94.5 99.6 99.3  PLT 182 202 [id number]    Basic Metabolic Panel: Recent Labs  Lab 02/10/21 0106 02/12/21 1304  NA 133* 134*  K 4.2 3.7  CL 93* 91*  CO2 25 29  GLUCOSE 125* 131*  BUN 58* 53*  CREATININE 11.78* 12.81*  CALCIUM 10.4* 10.2  PHOS  --  5.1*  Liver Function Tests: Recent Labs  Lab 02/12/21 1304  ALBUMIN 3.2*     Antibiotics: Anti-infectives (From admission, onward)   Start     Dose/Rate Route Frequency Ordered Stop   01/23/21 1400  amoxicillin-clavulanate (AUGMENTIN) 500-125 MG per tablet 500 mg        500 mg Oral Every 24 hours 01/23/21 1124 01/25/21 1657   01/23/21 1015  amoxicillin-clavulanate (AUGMENTIN) 875-125 MG per tablet 1 tablet  Status:  Discontinued        1 tablet Oral Every 12 hours 01/23/21 1002 01/23/21 1124    01/20/21 0845  vancomycin (VANCOCIN) 1-5 GM/200ML-% IVPB       Note to Pharmacy: Judieth Keens  : cabinet override      01/20/21 0845 01/20/21 2059   01/18/21 1200  vancomycin (VANCOCIN) IVPB 1000 mg/200 mL premix  Status:  Discontinued        1,000 mg 200 mL/hr over 60 Minutes Intravenous Every M-W-F (Hemodialysis) 01/16/21 1017 01/21/21 1324   01/17/21 2200  Ampicillin-Sulbactam (UNASYN) 3 g in sodium chloride 0.9 % 100 mL IVPB  Status:  Discontinued        3 g 200 mL/hr over 30 Minutes Intravenous Daily at bedtime 01/16/21 1058 01/23/21 1002   01/17/21 1000  remdesivir 100 mg in sodium chloride 0.9 % 100 mL IVPB       "Followed by" Linked Group Details   100 mg 200 mL/hr over 30 Minutes Intravenous Daily 01/15/21 2357 01/18/21 0918   01/16/21 1145  Ampicillin-Sulbactam (UNASYN) 3 g in sodium chloride 0.9 % 100 mL IVPB        3 g 200 mL/hr over 30 Minutes Intravenous  Once 01/16/21 1058 01/16/21 1417   01/16/21 1100  vancomycin (VANCOREADY) IVPB 1500 mg/300 mL        1,500 mg 150 mL/hr over 120 Minutes Intravenous  Once 01/16/21 1017 01/16/21 1849   01/16/21 0300  remdesivir 200 mg in sodium chloride 0.9% 250 mL IVPB       "Followed by" Linked Group Details   200 mg 580 mL/hr over 30 Minutes Intravenous Once 01/15/21 2357 01/16/21 0543   01/15/21 2345  amoxicillin-clavulanate (AUGMENTIN) 875-125 MG per tablet 1 tablet  Status:  Discontinued        1 tablet Oral Every 12 hours 01/15/21 2334 01/15/21 2357   01/15/21 1800  vancomycin (VANCOCIN) IVPB 1000 mg/200 mL premix        1,000 mg 200 mL/hr over 60 Minutes Intravenous  Once 01/15/21 1753 01/15/21 2120       DVT prophylaxis: Heparin  Code Status: Full code  Family Communication: No family at bedside   Consultants:  Nephrology  General surgery  Procedures:  I &D of breast abscess    Objective   Vitals:   02/15/21 1646 02/15/21 2033 02/16/21 0535 02/16/21 1003  BP: (!) 144/73 (!) 133/100 138/81 (!) 147/99   Pulse: 62 65 64 66  Resp: '18 18 16 18  '$ Temp:  98 F (36.7 C) 97.9 F (36.6 C) 98 F (36.7 C)  TempSrc:   Oral   SpO2:  99% 99% 96%  Weight:      Height:        Intake/Output Summary (Last 24 hours) at 02/16/2021 1245 Last data filed at 02/16/2021 0813 Gross per 24 hour  Intake 840 ml  Output 120 ml  Net 720 ml    02/20 1901 - 02/22 0700 In: 1320 [P.O.:1320] Out: 120 [Urine:120]  [employer]  02/10/21 1110 02/12/21 1250 02/12/21 1605  Weight: 99.6 kg 102.7 kg 99.5 kg    Physical Examination:   General-appears in no acute distress Heart-S1-S2, regular, no murmur auscultated Lungs-clear to auscultation bilaterally, no wheezing or crackles auscultated Abdomen-soft, nontender, no organomegaly Extremities-no edema in the lower extremities, bilateral TMA Neuro-alert, oriented x3, no focal deficit noted  Status is: Inpatient  Dispo: The patient is from: Home              Anticipated d/c is to: Home              Anticipated d/c date is: 02/18/2021              Patient currently medically stable for discharge  Barrier to discharge-awaiting place for safe discharge       Data Reviewed:   No results found for this or any previous visit (from the past 240 hour(s)).  No results for input(s): LIPASE, AMYLASE in the last 168 hours. No results for input(s): AMMONIA in the last 168 hours.  Cardiac Enzymes: No results for input(s): CKTOTAL, CKMB, CKMBINDEX, TROPONINI in the last 168 hours. BNP (last 3 results) Recent Labs    01/18/21 0136 01/19/21 0030 01/20/21 0020  BNP 1,410.7* 1,122.4* 1,383.7*     Oswald Hillock   [facility] If 7PM-7AM, please contact night-coverage at [email or web address], Office  [phone]   02/16/2021, 12:45 PM  LOS: 32 days

## 2021-02-16 NOTE — Progress Notes (Signed)
PT Cancellation Note  Patient Details Name: Rebekah Burton MRN: [id number] DOB: 1976/02/17   Cancelled Treatment:    Reason Eval/Treat Not Completed: Patient at procedure or test/unavailable still at HD. Will continue to follow.    Windell Norfolk, DPT, PN1   Supplemental Physical Therapist [facility]    Pager [phone] Acute Rehab Office [phone]

## 2021-02-16 NOTE — Progress Notes (Signed)
PT Cancellation Note  Patient Details Name: Rebekah Burton MRN: [id number] DOB: [birth year]   Cancelled Treatment:    Reason Eval/Treat Not Completed: Patient declined, no reason specified adamantly refusing therapy due to arm pain and "feeling tired and weak". Awaiting HD this morning. Will attempt to check back if time/schedule allow.    Windell Norfolk, DPT, PN1   Supplemental Physical Therapist [facility]    Pager [phone] Acute Rehab Office [phone]

## 2021-02-17 DIAGNOSIS — N611 Abscess of the breast and nipple: Secondary | ICD-10-CM | POA: Diagnosis not present

## 2021-02-17 LAB — CBC
HCT: 29.6 % — ABNORMAL LOW (ref 36.0–46.0)
Hemoglobin: 9.2 g/dL — ABNORMAL LOW (ref 12.0–15.0)
MCH: 30.9 pg (ref 26.0–34.0)
MCHC: 31.1 g/dL (ref 30.0–36.0)
MCV: 99.3 fL (ref 80.0–100.0)
Platelets: 175 10*3/uL (ref 150–400)
RBC: 2.98 MIL/uL — ABNORMAL LOW (ref 3.87–5.11)
RDW: 18.7 % — ABNORMAL HIGH (ref 11.5–15.5)
WBC: 7.3 10*3/uL (ref 4.0–10.5)
nRBC: 0 % (ref 0.0–0.2)

## 2021-02-17 LAB — RENAL FUNCTION PANEL
Albumin: 3.2 g/dL — ABNORMAL LOW (ref 3.5–5.0)
Anion gap: 14 (ref 5–15)
BUN: 44 mg/dL — ABNORMAL HIGH (ref 6–20)
CO2: 24 mmol/L (ref 22–32)
Calcium: 10.1 mg/dL (ref 8.9–10.3)
Chloride: 96 mmol/L — ABNORMAL LOW (ref 98–111)
Creatinine, Ser: 11.13 mg/dL — ABNORMAL HIGH (ref 0.44–1.00)
GFR, Estimated: 4 mL/min — ABNORMAL LOW (ref >60)
Glucose, Bld: 144 mg/dL — ABNORMAL HIGH (ref 70–99)
Phosphorus: 5.4 mg/dL — ABNORMAL HIGH (ref 2.5–4.6)
Potassium: 3.8 mmol/L (ref 3.5–5.1)
Sodium: 134 mmol/L — ABNORMAL LOW (ref 135–145)

## 2021-02-17 NOTE — Plan of Care (Signed)
  Problem: Pain Managment: Goal: General experience of comfort will improve Outcome: Progressing   Problem: Safety: Goal: Ability to remain free from injury will improve Outcome: Progressing   

## 2021-02-17 NOTE — Progress Notes (Signed)
[facility] Progress Note   Subjective:     Rebekah Burton was seen and examined at bedside today. Patient was cannulated successfully and received full HD treatment yesterday 02/16/21 with UF net of 2.5L. She reports tolerating treatment well. Currently, she has no issues or concerns. Denies CP, SOB, N/V/D. Patient is scheduled for HD today to stay on track with her regular schedule.  Objective Vitals:   02/17/21 1330 02/17/21 1400 02/17/21 1430 02/17/21 1500  BP: 137/63 (!) 117/51 103/80 113/63  Pulse: 87 77 64 71  Resp:      Temp:      TempSrc:      SpO2:      Weight:      Height:       Physical Exam General: Appears comfortable, in no acute respiratory distress Heart: Normal S1 and S2; No murmurs, gallops, or friction rub Lungs: Clear throughout w/o wheezing, rhonchi, or rales Abdomen: soft, round, non-tender, active bowel sounds Extremities: no edema noted upper/lower extremities or bilateral hips Dialysis Access: L AVF (+) Bruit/Thrill  Filed Weights   02/12/21 1605 02/16/21 1230 02/17/21 1235  Weight: 99.5 kg 102.9 kg 101.7 kg    Intake/Output Summary (Last 24 hours) at 02/17/2021 1534 Last data filed at 02/17/2021 0800 Gross per 24 hour  Intake 340 ml  Output 2500 ml  Net -2160 ml    Additional Objective Labs: Basic Metabolic Panel: Recent Labs  Lab 02/12/21 1304 02/16/21 1204 02/17/21 1312  NA 134* 132* 134*  K 3.7 4.2 3.8  CL 91* 91* 96*  CO2 29 25 24   GLUCOSE 131* 128* 144*  BUN 53* 76* 44*  CREATININE 12.81* 16.11* 11.13*  CALCIUM 10.2 10.3 10.1  PHOS 5.1* 7.0* 5.4*   Liver Function Tests: Recent Labs  Lab 02/12/21 1304 02/16/21 1204 02/17/21 1312  ALBUMIN 3.2* 3.3* 3.2*   No results for input(s): LIPASE, AMYLASE in the last 168 hours. CBC: Recent Labs  Lab 02/12/21 1351 02/15/21 0552 02/16/21 1204 02/16/21 1813 02/17/21 1312  WBC 5.5 6.6 7.9 7.5 7.3  HGB 8.5* 9.1* 9.3* 9.7* 9.2*  HCT 28.3* 30.0* 29.8* 30.1* 29.6*  MCV  99.6 99.3 96.8 97.1 99.3  PLT 202 194 205 188 175   Blood Culture    Component Value Date/Time   SDES ABSCESS LEFT BREAST 01/16/2021 0009   SPECREQUEST NONE 01/16/2021 0009   CULT  01/16/2021 0009    MIXED ANAEROBIC FLORA PRESENT.  CALL LAB IF FURTHER IID REQUIRED.   REPTSTATUS 01/21/2021 FINAL 01/16/2021 0009    Medications: . sodium chloride    . sodium chloride    . sodium chloride     . carvedilol  25 mg Oral BID WC  . Chlorhexidine Gluconate Cloth  6 each Topical Q0600  . cloNIDine  0.3 mg Oral TID  . darbepoetin (ARANESP) injection - DIALYSIS  150 mcg Intravenous Q Mon-HD  . heparin  5,000 Units Subcutaneous Q8H  . multivitamin  1 tablet Oral QHS  . senna  1 tablet Oral BID  . sevelamer carbonate  3,200 mg Oral TID WC    Dialysis Orders: [place] 4h 44mn 2/2.5 bath 105kg AVF Heparin ? parsabiv 15 mg three times a week with HD mircera 150 mcg every 2 weeks - last given 01/13/21 hectorol 3 mcg three times a week  Assessment/Plan: 1. End-stage renal disease -on MWF.Successfully cannulated yesterday 02/16/21. UF met at 2.5L. Scheduled for HD today to stay on her regular schedule.  2. 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 Cannulated successfully on 02/16/21. Will continue to monitor vascular access. 3. Left breast abscess- s/p I&D, completed course of Augmentin. per primary  4. covid-19 infection-resolved,off of isolation. 5. HTN-BP in goal. Does not appear volume overloaded on exam.Continue current medications and UF with HD as tolerated.  6. Anemia CKD - Hgb9.2.Continue Aranesp 168mg weekly,iron saturation 34%, ferritin 1976, no indication for Fe supplementation. 7. Metabolic bone disease- Phos improving.  continue binders.Corrected calcium high, hectorol on hold, use low Ca bath. Not able to get parsabiv here.  8. Social Issues:AwaitingALFplacement. SW involved.  CTobie Poet NP [facility] 02/17/2021,3:34 PM  LOS: 33 days

## 2021-02-17 NOTE — Plan of Care (Signed)
  Problem: Pain Managment: Goal: General experience of comfort will improve Outcome: Progressing   

## 2021-02-17 NOTE — Progress Notes (Signed)
PROGRESS NOTE  Rebekah Burton  DOB: 05/13/1976  PCP: Trey Sailors, [place] [id number]  DOA: 01/15/2021  LOS: 33 days   Chief Complaint  Patient presents with  . Abscess   Brief narrative: Dorrine Guardian is a 45 y.o. female with PMH significant for ESRD on HD MWF, blindness, hypertension, PAD. Patient presented to the ED on 01/15/2021 with breast abscess, underwent I&D by general surgery.  She was initially treated with IV antibiotics later completed a course with oral antibiotics.  She was also found to be incidental Covid positive.  Now she is off contact precautions.  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  IR and vascular were consulted.  aVF is now working.  Dialysis per nephrology. Patient is homeless rhythm with her disposition is difficult  Subjective: Patient was seen and examined this morning.  Obese middle-aged African-American female. Not in distress.  Assessment/Plan: Breast abscess -s/p incision drainage done by general surgery.  She was started on Unasyn, pulmonary cultures grew gram-positive cocci.  She was transitioned to Augmentin and has completed the course.  Incidental COVID-19 -patient is asymptomatic, no evidence of pulmonary involvement.  -Off isolation. Currently on room air.  ESRD -nephrology was consulted, had issues with cannulation of aVF.  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  aVF is now working.  Dialysis per nephrology.  Continue sevelamer.  Anemia of chronic disease -monitor intermittently, hemoglobin is stable at 9.1.  Continue Aranesp. Recent Labs    01/16/21 0512 01/16/21 0821 02/03/21 1244 02/05/21 0641 02/12/21 1351 02/15/21 0552 02/16/21 1204 02/16/21 1813 02/17/21 1312  HGB  --    < > 8.4*   < > 8.5* 9.1* 9.3* 9.7* 9.2*  MCV  --    < > 97.0   < > 99.6 99.3 96.8 97.1 99.3   FERRITIN 1,976*  --   --   --   --   --   --   --   --   TIBC  --   --  259  --   --   --   --   --   --   IRON  --   --  88  --   --   --   --   --   --    < > = values in this interval not displayed.   Hyponatremia -patient on hemodialysis, nephrology following. Recent Labs  Lab 02/12/21 1304 02/16/21 1204 02/17/21 1312  NA 134* 132* 134*   Hypertension -Blood pressure controlled on clonidine and Coreg.  Peripheral artery disease -hx of Fem-pop bypass. -Has history of left transmetatarsal amputation.  Mobility: Encourage ambulation but she has transmetatarsal amputation Code Status:   Code Status: Full Code  Nutritional status: Body mass index is 37.65 kg/m.     Diet Order            Diet renal with fluid restriction Fluid restriction: 1200 mL Fluid; Room service appropriate? Yes; Fluid consistency: Thin  Diet effective now                 DVT prophylaxis: heparin injection 5,000 Units Start: 01/26/21 0600   Antimicrobials:  None Fluid: None Consultants: Nephrology Family Communication:  None at bedside  Status is: Inpatient Dispo: The patient is from: Homeless              Anticipated d/c is  to: SNF              Anticipated d/c date is: > 3 days              Patient currently is not medically stable to d/c.   Difficult to place patient Yes       Infusions:  . sodium chloride      Scheduled Meds: . carvedilol  25 mg Oral BID WC  . Chlorhexidine Gluconate Cloth  6 each Topical Q0600  . cloNIDine  0.3 mg Oral TID  . darbepoetin (ARANESP) injection - DIALYSIS  150 mcg Intravenous Q Mon-HD  . heparin  5,000 Units Subcutaneous Q8H  . multivitamin  1 tablet Oral QHS  . senna  1 tablet Oral BID  . sevelamer carbonate  3,200 mg Oral TID WC    Antimicrobials: Anti-infectives (From admission, onward)   Start     Dose/Rate Route Frequency Ordered Stop   01/23/21 1400  amoxicillin-clavulanate (AUGMENTIN) 500-125 MG per tablet 500 mg        500 mg  Oral Every 24 hours 01/23/21 1124 01/25/21 1657   01/23/21 1015  amoxicillin-clavulanate (AUGMENTIN) 875-125 MG per tablet 1 tablet  Status:  Discontinued        1 tablet Oral Every 12 hours 01/23/21 1002 01/23/21 1124   01/20/21 0845  vancomycin (VANCOCIN) 1-5 GM/200ML-% IVPB       Note to Pharmacy: Judieth Keens  : cabinet override      01/20/21 0845 01/20/21 2059   01/18/21 1200  vancomycin (VANCOCIN) IVPB 1000 mg/200 mL premix  Status:  Discontinued        1,000 mg 200 mL/hr over 60 Minutes Intravenous Every M-W-F (Hemodialysis) 01/16/21 1017 01/21/21 1324   01/17/21 2200  Ampicillin-Sulbactam (UNASYN) 3 g in sodium chloride 0.9 % 100 mL IVPB  Status:  Discontinued        3 g 200 mL/hr over 30 Minutes Intravenous Daily at bedtime 01/16/21 1058 01/23/21 1002   01/17/21 1000  remdesivir 100 mg in sodium chloride 0.9 % 100 mL IVPB       "Followed by" Linked Group Details   100 mg 200 mL/hr over 30 Minutes Intravenous Daily 01/15/21 2357 01/18/21 0918   01/16/21 1145  Ampicillin-Sulbactam (UNASYN) 3 g in sodium chloride 0.9 % 100 mL IVPB        3 g 200 mL/hr over 30 Minutes Intravenous  Once 01/16/21 1058 01/16/21 1417   01/16/21 1100  vancomycin (VANCOREADY) IVPB 1500 mg/300 mL        1,500 mg 150 mL/hr over 120 Minutes Intravenous  Once 01/16/21 1017 01/16/21 1849   01/16/21 0300  remdesivir 200 mg in sodium chloride 0.9% 250 mL IVPB       "Followed by" Linked Group Details   200 mg 580 mL/hr over 30 Minutes Intravenous Once 01/15/21 2357 01/16/21 0543   01/15/21 2345  amoxicillin-clavulanate (AUGMENTIN) 875-125 MG per tablet 1 tablet  Status:  Discontinued        1 tablet Oral Every 12 hours 01/15/21 2334 01/15/21 2357   01/15/21 1800  vancomycin (VANCOCIN) IVPB 1000 mg/200 mL premix        1,000 mg 200 mL/hr over 60 Minutes Intravenous  Once 01/15/21 1753 01/15/21 2120      PRN meds: sodium chloride, acetaminophen **OR** [DISCONTINUED] acetaminophen,  HYDROcodone-acetaminophen   Objective: Vitals:   02/17/21 1605 02/17/21 1640  BP: 127/75 103/84  Pulse: 70 75  Resp: 16 19  Temp:  98.3 F (36.8 C) 98.3 F (36.8 C)  SpO2: 100% 100%    Intake/Output Summary (Last 24 hours) at 02/17/2021 1803 Last data filed at 02/17/2021 1605 Gross per 24 hour  Intake 340 ml  Output 2000 ml  Net -1660 ml   Filed Weights   02/16/21 1230 02/17/21 1235 02/17/21 1605  Weight: 102.9 kg 101.7 kg 99.5 kg   Weight change:  Body mass index is 37.65 kg/m.   Physical Exam: General exam: Pleasant young African-American female, obese, not in distress Skin: No rashes, lesions or ulcers. HEENT: Atraumatic, normocephalic, no obvious bleeding Lungs: Clear to auscultation bilaterally CVS: Regular rate and rhythm, no murmur GI/Abd soft, nontender, nondistended, bowel sound present CNS: Alert, awake, oriented x3 Psychiatry: Depressed look Extremities: No pedal edema, no calf tenderness  Data Review: I have personally reviewed the laboratory data and studies available.  Recent Labs  Lab 02/12/21 1351 02/15/21 0552 02/16/21 1204 02/16/21 1813 02/17/21 1312  WBC 5.5 6.6 7.9 7.5 7.3  HGB 8.5* 9.1* 9.3* 9.7* 9.2*  HCT 28.3* 30.0* 29.8* 30.1* 29.6*  MCV 99.6 99.3 96.8 97.1 99.3  PLT 202 194 205 188 175   Recent Labs  Lab 02/12/21 1304 02/16/21 1204 02/17/21 1312  NA 134* 132* 134*  K 3.7 4.2 3.8  CL 91* 91* 96*  CO2 '29 25 24  '$ GLUCOSE 131* 128* 144*  BUN 53* 76* 44*  CREATININE 12.81* 16.11* 11.13*  CALCIUM 10.2 10.3 10.1  PHOS 5.1* 7.0* 5.4*    F/u labs  Unresulted Labs (From admission, onward)         None      Signed, Terrilee Croak, MD [facility] 02/17/2021

## 2021-02-18 DIAGNOSIS — N611 Abscess of the breast and nipple: Secondary | ICD-10-CM | POA: Diagnosis not present

## 2021-02-18 DIAGNOSIS — N186 End stage renal disease: Secondary | ICD-10-CM | POA: Diagnosis not present

## 2021-02-18 MED ORDER — SODIUM CHLORIDE 0.9 % IV SOLN: 100.0000 mL | INTRAVENOUS | Status: DC | PRN

## 2021-02-18 MED ORDER — LIDOCAINE-PRILOCAINE 2.5-2.5 % EX CREA: 1.0000 "application " | TOPICAL_CREAM | CUTANEOUS | Status: DC | PRN

## 2021-02-18 MED ORDER — HEPARIN SODIUM (PORCINE) 1000 UNIT/ML DIALYSIS: 1000.0000 [IU] | INTRAMUSCULAR | Status: DC | PRN

## 2021-02-18 MED ORDER — LIDOCAINE HCL (PF) 1 % IJ SOLN: 5.0000 mL | INTRAMUSCULAR | Status: DC | PRN

## 2021-02-18 MED ORDER — PENTAFLUOROPROP-TETRAFLUOROETH EX AERO: 1.0000 "application " | INHALATION_SPRAY | CUTANEOUS | Status: DC | PRN

## 2021-02-18 MED ORDER — ALTEPLASE 2 MG IJ SOLR: 2.0000 mg | Freq: Once | INTRAMUSCULAR | Status: DC | PRN

## 2021-02-18 NOTE — Progress Notes (Signed)
PROGRESS NOTE  Rebekah Burton  DOB: [birth year]  PCP: Trey Sailors, [place] [id number]  DOA: 01/15/2021  LOS: 34 days   Chief Complaint  Patient presents with  . Abscess   Brief narrative: Rebekah Burton is a 45 y.o. female with PMH significant for ESRD on HD MWF, blindness, hypertension, PAD. Patient presented to the ED on 01/15/2021 with breast abscess, underwent I&D by general surgery.  She was initially treated with IV antibiotics later completed a course with oral antibiotics.  She was also found to be incidental Covid positive.  Now she is off contact precautions.  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  IR and vascular were consulted.  aVF is now working. Dialysis per nephrology. Patient is homeless rhythm with her disposition is difficult.  Subjective: Patient was seen and examined this morning.  Obese middle-aged African-American female. Not in distress.  Assessment/Plan: Breast abscess -s/p incision drainage done by general surgery.  She was started on Unasyn, pulmonary cultures grew gram-positive cocci.  She was transitioned to Augmentin and has completed the course.  Incidental COVID-19 -patient is asymptomatic, no evidence of pulmonary involvement.  -Off isolation. Currently on room air.  ESRD -nephrology was consulted, had issues with cannulation of aVF.  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  aVF is now working.  Dialysis per nephrology.  Continue sevelamer.  Anemia of chronic disease -monitor intermittently, hemoglobin is stable at 9.1.  Continue Aranesp. Recent Labs    01/16/21 0512 01/16/21 0821 02/03/21 1244 02/05/21 0641 02/12/21 1351 02/15/21 0552 02/16/21 1204 02/16/21 1813 02/17/21 1312  HGB  --    < > 8.4*   < > 8.5* 9.1* 9.3* 9.7* 9.2*  MCV  --    < > 97.0   < > 99.6 99.3 96.8 97.1 99.3   FERRITIN 1,976*  --   --   --   --   --   --   --   --   TIBC  --   --  259  --   --   --   --   --   --   IRON  --   --  88  --   --   --   --   --   --    < > = values in this interval not displayed.   Hyponatremia -patient on hemodialysis, nephrology following. Recent Labs  Lab 02/12/21 1304 02/16/21 1204 02/17/21 1312  NA 134* 132* 134*   Hypertension -Blood pressure controlled on clonidine and Coreg.  Peripheral artery disease -hx of Fem-pop bypass. -Has history of left transmetatarsal amputation.  Mobility: Encourage ambulation but she has transmetatarsal amputation Code Status:   Code Status: Full Code  Nutritional status: Body mass index is 37.65 kg/m.     Diet Order            Diet renal with fluid restriction Fluid restriction: 1200 mL Fluid; Room service appropriate? Yes; Fluid consistency: Thin  Diet effective now                 DVT prophylaxis: heparin injection 5,000 Units Start: 01/26/21 0600   Antimicrobials:  None Fluid: None Consultants: Nephrology Family Communication:  None at bedside  Status is: Inpatient Dispo: The patient is from: Homeless              Anticipated d/c is to:  SNF              Anticipated d/c date is: > 3 days              Patient currently is not medically stable to d/c.   Difficult to place patient Yes       Infusions:  . sodium chloride    . sodium chloride    . sodium chloride      Scheduled Meds: . carvedilol  25 mg Oral BID WC  . Chlorhexidine Gluconate Cloth  6 each Topical Q0600  . cloNIDine  0.3 mg Oral TID  . darbepoetin (ARANESP) injection - DIALYSIS  150 mcg Intravenous Q Mon-HD  . heparin  5,000 Units Subcutaneous Q8H  . multivitamin  1 tablet Oral QHS  . senna  1 tablet Oral BID  . sevelamer carbonate  3,200 mg Oral TID WC    Antimicrobials: Anti-infectives (From admission, onward)   Start     Dose/Rate Route Frequency Ordered Stop   01/23/21 1400  amoxicillin-clavulanate (AUGMENTIN)  500-125 MG per tablet 500 mg        500 mg Oral Every 24 hours 01/23/21 1124 01/25/21 1657   01/23/21 1015  amoxicillin-clavulanate (AUGMENTIN) 875-125 MG per tablet 1 tablet  Status:  Discontinued        1 tablet Oral Every 12 hours 01/23/21 1002 01/23/21 1124   01/20/21 0845  vancomycin (VANCOCIN) 1-5 GM/200ML-% IVPB       Note to Pharmacy: Judieth Keens  : cabinet override      01/20/21 0845 01/20/21 2059   01/18/21 1200  vancomycin (VANCOCIN) IVPB 1000 mg/200 mL premix  Status:  Discontinued        1,000 mg 200 mL/hr over 60 Minutes Intravenous Every M-W-F (Hemodialysis) 01/16/21 1017 01/21/21 1324   01/17/21 2200  Ampicillin-Sulbactam (UNASYN) 3 g in sodium chloride 0.9 % 100 mL IVPB  Status:  Discontinued        3 g 200 mL/hr over 30 Minutes Intravenous Daily at bedtime 01/16/21 1058 01/23/21 1002   01/17/21 1000  remdesivir 100 mg in sodium chloride 0.9 % 100 mL IVPB       "Followed by" Linked Group Details   100 mg 200 mL/hr over 30 Minutes Intravenous Daily 01/15/21 2357 01/18/21 0918   01/16/21 1145  Ampicillin-Sulbactam (UNASYN) 3 g in sodium chloride 0.9 % 100 mL IVPB        3 g 200 mL/hr over 30 Minutes Intravenous  Once 01/16/21 1058 01/16/21 1417   01/16/21 1100  vancomycin (VANCOREADY) IVPB 1500 mg/300 mL        1,500 mg 150 mL/hr over 120 Minutes Intravenous  Once 01/16/21 1017 01/16/21 1849   01/16/21 0300  remdesivir 200 mg in sodium chloride 0.9% 250 mL IVPB       "Followed by" Linked Group Details   200 mg 580 mL/hr over 30 Minutes Intravenous Once 01/15/21 2357 01/16/21 0543   01/15/21 2345  amoxicillin-clavulanate (AUGMENTIN) 875-125 MG per tablet 1 tablet  Status:  Discontinued        1 tablet Oral Every 12 hours 01/15/21 2334 01/15/21 2357   01/15/21 1800  vancomycin (VANCOCIN) IVPB 1000 mg/200 mL premix        1,000 mg 200 mL/hr over 60 Minutes Intravenous  Once 01/15/21 1753 01/15/21 2120      PRN meds: sodium chloride, sodium chloride, sodium  chloride, acetaminophen **OR** [DISCONTINUED] acetaminophen, alteplase, heparin, HYDROcodone-acetaminophen, lidocaine (PF), lidocaine-prilocaine, pentafluoroprop-tetrafluoroeth   Objective:  Vitals:   02/18/21 0458 02/18/21 0900  BP: 140/80 132/71  Pulse: 67 66  Resp: 20 18  Temp: 98 F (36.7 C) 98 F (36.7 C)  SpO2: 100% 98%    Intake/Output Summary (Last 24 hours) at 02/18/2021 1614 Last data filed at 02/18/2021 1600 Gross per 24 hour  Intake 1175 ml  Output 0 ml  Net 1175 ml   Filed Weights   02/17/21 1235 02/17/21 1605 02/17/21 2111  Weight: 101.7 kg 99.5 kg 99.5 kg   Weight change: -1.2 kg Body mass index is 37.65 kg/m.   Physical Exam: General exam: Pleasant young African-American female, obese, not in distress Skin: No rashes, lesions or ulcers. HEENT: Atraumatic, normocephalic, no obvious bleeding Lungs: Clear to auscultation bilaterally CVS: Regular rate and rhythm, no murmur GI/Abd soft, nontender, nondistended, bowel sound present CNS: Alert, awake, oriented x3 Psychiatry: Depressed look Extremities: No pedal edema, no calf tenderness  Data Review: I have personally reviewed the laboratory data and studies available.  Recent Labs  Lab 02/12/21 1351 02/15/21 0552 02/16/21 1204 02/16/21 1813 02/17/21 1312  WBC 5.5 6.6 7.9 7.5 7.3  HGB 8.5* 9.1* 9.3* 9.7* 9.2*  HCT 28.3* 30.0* 29.8* 30.1* 29.6*  MCV 99.6 99.3 96.8 97.1 99.3  PLT 202 194 205 188 175   Recent Labs  Lab 02/12/21 1304 02/16/21 1204 02/17/21 1312  NA 134* 132* 134*  K 3.7 4.2 3.8  CL 91* 91* 96*  CO2 '29 25 24  '$ GLUCOSE 131* 128* 144*  BUN 53* 76* 44*  CREATININE 12.81* 16.11* 11.13*  CALCIUM 10.2 10.3 10.1  PHOS 5.1* 7.0* 5.4*    F/u labs  Unresulted Labs (From admission, onward)         None      Signed, Terrilee Croak, MD [facility] 02/18/2021

## 2021-02-18 NOTE — Progress Notes (Signed)
[facility] Progress Note   Subjective:     Rebekah Burton seen and examined today at bedside. She reports feeling well with no concerns or issues at this time. Also reports tolerating HD treatments well.  Objective Vitals:   02/17/21 1640 02/17/21 2111 02/18/21 0458 02/18/21 0900  BP: 103/84 (!) 147/67 140/80 132/71  Pulse: 75 84 67 66  Resp: 19 18 20 18   Temp: 98.3 F (36.8 C) 98.2 F (36.8 C) 98 F (36.7 C) 98 F (36.7 C)  TempSrc:   Oral Oral  SpO2: 100% 99% 100% 98%  Weight:  99.5 kg    Height:       Physical Exam General:Appears comfortable, in no acute respiratory distress Heart:Normal S1 and S2; No murmurs, gallops, or friction rub Lungs:Clear throughout w/o wheezing, rhonchi, or rales Abdomen:soft, round, non-tender, active bowel sounds Extremities:no edema noted upper/lower extremities or bilateral hips Dialysis Access:LAVF (+) Bruit/Thrill  Filed Weights   02/17/21 1235 02/17/21 1605 02/17/21 2111  Weight: 101.7 kg 99.5 kg 99.5 kg    Intake/Output Summary (Last 24 hours) at 02/18/2021 1200 Last data filed at 02/18/2021 0836 Gross per 24 hour  Intake 1195 ml  Output 2000 ml  Net -805 ml    Additional Objective Labs: Basic Metabolic Panel: Recent Labs  Lab 02/12/21 1304 02/16/21 1204 02/17/21 1312  NA 134* 132* 134*  K 3.7 4.2 3.8  CL 91* 91* 96*  CO2 29 25 24   GLUCOSE 131* 128* 144*  BUN 53* 76* 44*  CREATININE 12.81* 16.11* 11.13*  CALCIUM 10.2 10.3 10.1  PHOS 5.1* 7.0* 5.4*   Liver Function Tests: Recent Labs  Lab 02/12/21 1304 02/16/21 1204 02/17/21 1312  ALBUMIN 3.2* 3.3* 3.2*   CBC: Recent Labs  Lab 02/12/21 1351 02/15/21 0552 02/16/21 1204 02/16/21 1813 02/17/21 1312  WBC 5.5 6.6 7.9 7.5 7.3  HGB 8.5* 9.1* 9.3* 9.7* 9.2*  HCT 28.3* 30.0* 29.8* 30.1* 29.6*  MCV 99.6 99.3 96.8 97.1 99.3  PLT 202 194 205 188 175   Blood Culture    Component Value Date/Time   SDES ABSCESS LEFT BREAST 01/16/2021 0009    SPECREQUEST NONE 01/16/2021 0009   CULT  01/16/2021 0009    MIXED ANAEROBIC FLORA PRESENT.  CALL LAB IF FURTHER IID REQUIRED.   REPTSTATUS 01/21/2021 FINAL 01/16/2021 0009    Medications: . sodium chloride    . sodium chloride    . sodium chloride     . carvedilol  25 mg Oral BID WC  . Chlorhexidine Gluconate Cloth  6 each Topical Q0600  . cloNIDine  0.3 mg Oral TID  . darbepoetin (ARANESP) injection - DIALYSIS  150 mcg Intravenous Q Mon-HD  . heparin  5,000 Units Subcutaneous Q8H  . multivitamin  1 tablet Oral QHS  . senna  1 tablet Oral BID  . sevelamer carbonate  3,200 mg Oral TID WC    Dialysis Orders: [place] 4h 75mn 2/2.5 bath 105kg AVF Heparin ? parsabiv 15 mg three times a week with HD mircera 150 mcg every 2 weeks - last given 01/13/21 hectorol 3 mcg three times a week  Assessment/Plan: 1. End-stage renal disease -on MWF.Tolerated HD treatment 02/17/21. UF met at 2L. Scheduled for HD tomorrow 02/19/21.  2. HD vascular access- history ofcannulationissuesAVF.IR consulted 01/25/21 which revealed significant pseudoaneurysm s/p thrombin injection and near complete thrombosis of pseudoaneurysm per IR.Per patient infiltrated AVF on 2/16also with difficulty with cannulation on 02/15/21. Cannulated successfully on 02/16/21 and 02/17/21. Continue to monitor  access. 3. Left breast abscess- s/p I&D, completed course of Augmentin. per primary  4. covid-19 infection-resolved,off of isolation. 5. HTN-BP in goal. Does not appear volume overloaded on exam.Continue current medications and UF with HD as tolerated.  6. Anemia CKD - Hgb9.2.Continue Aranesp 110mg weekly,iron saturation 34%, ferritin 1976, no indication for Fe supplementation. 7. Metabolic bone disease- Phos improving. continue binders.Corrected calcium high, hectorol on hold, use low Ca bath. Not able to get parsabiv here.  8. Social Issues:Per Hospitalist note, anticipated d/c to SNF. SW  involved.  CTobie Poet NP [facility] 02/18/2021,12:00 PM  LOS: 34 days

## 2021-02-18 NOTE — Progress Notes (Signed)
Physical Therapy Treatment Patient Details Name: Rebekah Burton MRN: [id number] DOB: February 10, 1976 Today's Date: 02/18/2021    History of Present Illness 45 y.o. female with medical history significant of ESRD-HD M-W-F, blindness, HTN, PAD had developed a breast abscess and was seen in ED 01/04/21 where U/S confirmed abscess. She was to have been seen as outpatient for aspiration/drainage but did not get to make that appt. Today in HD the breast abscess opened and drained spontaneously. Because of continued drainage she presents to [facility]ED for evaluation. Found to be COVID+ taken to OR for further exploration, culture, and excisional debridement 01/15/21    PT Comments    Pt received sitting on EOB, cooperative and pleasant. Exercises today focused on balance activities with narrow BOS and in single leg stance. Increased sway in static modified tandem stance and in single leg exercises, especially when right LE is challenged. Completed sidestepping 2f in each direction and step up/step taps (forward and lateral) to practice loading onto single leg in various positions. Pt needing cues for navigation and min A in modified tandem stance for steadiness. Pt left in chair with all needs met and call bell within reach. Reported she is happy with PT and would like to continue working on her balance and coordination.     Follow Up Recommendations  No PT follow up;Supervision for mobility/OOB     Equipment Recommendations  None recommended by PT    Recommendations for Other Services       Precautions / Restrictions Precautions Precautions: Other (comment) Precaution Comments: pt is blind, needs assist for navigation, L breast abscess Restrictions Weight Bearing Restrictions: No    Mobility  Bed Mobility Overal bed mobility: Modified Independent Bed Mobility: Supine to Sit                Transfers Overall transfer level: Modified independent   Transfers: Sit to/from Stand Sit to Stand:  Modified independent (Device/Increase time)            Ambulation/Gait Ambulation/Gait assistance: Min guard Gait Distance (Feet): 100 Feet (40 ft sidestepping to L, 40 ft sidestepping to R) Assistive device: None Gait Pattern/deviations: Step-through pattern;Decreased step length - right;Decreased step length - left;Drifts right/left Gait velocity: decreased   General Gait Details: Cues for navigation   Stairs             Wheelchair Mobility    Modified Rankin (Stroke Patients Only)       Balance Overall balance assessment: Needs assistance Sitting-balance support: No upper extremity supported;Feet supported Sitting balance-Leahy Scale: Normal     Standing balance support: During functional activity;No upper extremity supported Standing balance-Leahy Scale: Good Standing balance comment: Steady in static standing and foward walking                            Cognition Arousal/Alertness: Awake/alert Behavior During Therapy: WFL for tasks assessed/performed Overall Cognitive Status: Within Functional Limits for tasks assessed                                 General Comments: very pleasant and cooperative, willing to try new exercises, aware of deficits and abilities      Exercises Other Exercises Other Exercises: Forward step up, 15 total holding onto right railing (Increased knee pain, did not do more) Other Exercises: Lateral step taps, 2x10 B with 2 fingers on railing (Increased sway (more with  R lateral step taps)) Other Exercises: Tandem stance, x30 sec each side    General Comments General comments (skin integrity, edema, etc.): Steady in double leg stance, increased sway with single leg stance activity, trialed tandem walking but too difficult and pt with increased sway and 1 LOB      Pertinent Vitals/Pain Pain Assessment: No/denies pain    Home Living                      Prior Function            PT  Goals (current goals can now be found in the care plan section) Acute Rehab PT Goals Patient Stated Goal: have less pain PT Goal Formulation: With patient Potential to Achieve Goals: Good Additional Goals Additional Goal #1: Pt will be able to navigate her new home environment with supervision Additional Goal #2: Will score at least 18 on DGI Progress towards PT goals: Progressing toward goals    Frequency    Min 2X/week      PT Plan Current plan remains appropriate    Co-evaluation              AM-PAC PT "6 Clicks" Mobility   Outcome Measure  Help needed turning from your back to your side while in a flat bed without using bedrails?: None Help needed moving from lying on your back to sitting on the side of a flat bed without using bedrails?: None Help needed moving to and from a bed to a chair (including a wheelchair)?: None Help needed standing up from a chair using your arms (e.g., wheelchair or bedside chair)?: None Help needed to walk in hospital room?: A Little Help needed climbing 3-5 steps with a railing? : A Little 6 Click Score: 22    End of Session Equipment Utilized During Treatment: Gait belt Activity Tolerance: Patient tolerated treatment well Patient left: with call bell/phone within reach;in chair (sitting at EOB per her request) Nurse Communication: Mobility status PT Visit Diagnosis: Other abnormalities of gait and mobility (R26.89);Pain Pain - part of body:  (L breast and UE)     Time:  -     Charges:                       Rosita Kea, SPT

## 2021-02-19 DIAGNOSIS — N186 End stage renal disease: Secondary | ICD-10-CM | POA: Diagnosis not present

## 2021-02-19 LAB — RENAL FUNCTION PANEL
Albumin: 3.2 g/dL — ABNORMAL LOW (ref 3.5–5.0)
Anion gap: 16 — ABNORMAL HIGH (ref 5–15)
BUN: 46 mg/dL — ABNORMAL HIGH (ref 6–20)
CO2: 25 mmol/L (ref 22–32)
Calcium: 10.7 mg/dL — ABNORMAL HIGH (ref 8.9–10.3)
Chloride: 94 mmol/L — ABNORMAL LOW (ref 98–111)
Creatinine, Ser: 11.68 mg/dL — ABNORMAL HIGH (ref 0.44–1.00)
GFR, Estimated: 4 mL/min — ABNORMAL LOW (ref >60)
Glucose, Bld: 121 mg/dL — ABNORMAL HIGH (ref 70–99)
Phosphorus: 5 mg/dL — ABNORMAL HIGH (ref 2.5–4.6)
Potassium: 3.7 mmol/L (ref 3.5–5.1)
Sodium: 135 mmol/L (ref 135–145)

## 2021-02-19 LAB — CBC
HCT: 30.9 % — ABNORMAL LOW (ref 36.0–46.0)
Hemoglobin: 9.4 g/dL — ABNORMAL LOW (ref 12.0–15.0)
MCH: 30 pg (ref 26.0–34.0)
MCHC: 30.4 g/dL (ref 30.0–36.0)
MCV: 98.7 fL (ref 80.0–100.0)
Platelets: 200 10*3/uL (ref 150–400)
RBC: 3.13 MIL/uL — ABNORMAL LOW (ref 3.87–5.11)
RDW: 18.3 % — ABNORMAL HIGH (ref 11.5–15.5)
WBC: 7.9 10*3/uL (ref 4.0–10.5)
nRBC: 0 % (ref 0.0–0.2)

## 2021-02-19 NOTE — Progress Notes (Signed)
[facility] Progress Note   Subjective:     Rebekah Burton seen and examined today on HD unit. She reports feeling well with no concerns or issues at this time. Tolerating HD. Awaiting placement upon discharge.   Objective Vitals:   02/19/21 1000 02/19/21 1030 02/19/21 1100 02/19/21 1120  BP: 131/65 119/62 (!) 103/52 109/66  Pulse: 74 74 78 75  Resp:    16  Temp:    98.7 F (37.1 C)  TempSrc:    Oral  SpO2:    96%  Weight:    101 kg  Height:       Physical Exam General:Appears comfortable, in no acute respiratory distress Heart:Normal S1 and S2; No murmurs, gallops, or friction rub Lungs:Clear throughout w/o wheezing, rhonchi, or rales Abdomen:soft, round, non-tender, active bowel sounds Extremities:no edema noted upper/lower extremities or bilateral hips Dialysis Access:LAVF (+) Bruit/Thrill  Filed Weights   02/17/21 2111 02/19/21 0758 02/19/21 1120  Weight: 99.5 kg 103.2 kg 101 kg    Intake/Output Summary (Last 24 hours) at 02/19/2021 1254 Last data filed at 02/19/2021 1120 Gross per 24 hour  Intake 740 ml  Output 2000 ml  Net -1260 ml    Additional Objective Labs: Basic Metabolic Panel: Recent Labs  Lab 02/16/21 1204 02/17/21 1312 02/19/21 0837  NA 132* 134* 135  K 4.2 3.8 3.7  CL 91* 96* 94*  CO2 '25 24 25  '$ GLUCOSE 128* 144* 121*  BUN 76* 44* 46*  CREATININE 16.11* 11.13* 11.68*  CALCIUM 10.3 10.1 10.7*  PHOS 7.0* 5.4* 5.0*   Liver Function Tests: Recent Labs  Lab 02/16/21 1204 02/17/21 1312 02/19/21 0837  ALBUMIN 3.3* 3.2* 3.2*   CBC: Recent Labs  Lab 02/15/21 0552 02/16/21 1204 02/16/21 1813 02/17/21 1312 02/19/21 0836  WBC 6.6 7.9 7.5 7.3 7.9  HGB 9.1* 9.3* 9.7* 9.2* 9.4*  HCT 30.0* 29.8* 30.1* 29.6* 30.9*  MCV 99.3 96.8 97.1 99.3 98.7  PLT 194 205 188 175 200   Blood Culture    Component Value Date/Time   SDES ABSCESS LEFT BREAST 01/16/2021 0009   SPECREQUEST NONE 01/16/2021 0009   CULT  01/16/2021 0009     MIXED ANAEROBIC FLORA PRESENT.  CALL LAB IF FURTHER IID REQUIRED.   REPTSTATUS 01/21/2021 FINAL 01/16/2021 0009   Medications: . sodium chloride     . carvedilol  25 mg Oral BID WC  . Chlorhexidine Gluconate Cloth  6 each Topical Q0600  . cloNIDine  0.3 mg Oral TID  . darbepoetin (ARANESP) injection - DIALYSIS  150 mcg Intravenous Q Mon-HD  . heparin  5,000 Units Subcutaneous Q8H  . multivitamin  1 tablet Oral QHS  . senna  1 tablet Oral BID  . sevelamer carbonate  3,200 mg Oral TID WC    Dialysis Orders:  [place] 4h 31mn 2/2.5 bath 105kg AVF Heparin ? parsabiv 15 mg three times a week with HD mircera 150 mcg every 2 weeks - last given 01/13/21 hectorol 3 mcg three times a week  Assessment/Plan: 1. End-stage renal disease-on MWF.Currently receiving HD. Tolerating HD today.UF net 2L. 2. HD vascular access- history ofcannulationissuesAVF.IR consulted 01/25/21 which revealed significant pseudoaneurysm s/p thrombin injection and near complete thrombosis of pseudoaneurysm per IR.Per patient infiltrated AVF on 2/16also with difficulty with cannulation on 02/15/21.Cannulation currently successful. Continue to monitor access. 3. Left breast abscess- s/p I&D, completed course of Augmentin. per primary  4. covid-19 infection-resolved,off of isolation. 5. HTN-BP in goal. Does not appear volume overloaded on exam.Continue  current medications and UF with HD as tolerated.  6. Anemia CKD- Hgb9.4.ContinueAranesp 153mg weekly,iron saturation 34%, ferritin 1976, no indication for Fe supplementation. 7. Metabolic bone disease- Phos improving now 5.0. Continue binders.Corrected calcium high, hectorol on hold, use low Ca bath. Not able to get parsabiv here.  8. Social Issues:Per Hospitalist note, anticipated d/c to SNF. SW involved.  CTobie Poet NP [facility] 02/19/2021,12:54 PM  LOS: 35 days

## 2021-02-19 NOTE — Progress Notes (Signed)
PROGRESS NOTE  Rebekah Burton  DOB: [birth year]  PCP: Trey Sailors, [place] [id number]  DOA: 01/15/2021  LOS: 35 days   Chief Complaint  Patient presents with  . Abscess   Brief narrative: Rebekah Burton is a 45 y.o. female with PMH significant for ESRD on HD MWF, blindness, hypertension, PAD. Patient presented to the ED on 01/15/2021 with breast abscess, underwent I&D by general surgery.  She was initially treated with IV antibiotics later completed a course with oral antibiotics.  She was also found to be incidental Covid positive.  Now she is off contact precautions.  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  IR and vascular were consulted.  aVF is now working. Dialysis per nephrology. Patient is homeless rhythm with her disposition is difficult.  Subjective: Patient was seen and examined this morning in dialysis.  Not in distress.  No new symptoms. Obese middle-aged African-American female.  Assessment/Plan: Breast abscess -s/p incision drainage done by general surgery.  She was started on Unasyn, pulmonary cultures grew gram-positive cocci.  She was transitioned to Augmentin and has completed the course.  Incidental COVID-19 -patient is asymptomatic, no evidence of pulmonary involvement.  -Off isolation. Currently on room air.  ESRD -nephrology was consulted, had issues with cannulation of aVF.  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  aVF is now working.  Dialysis per nephrology.  Continue sevelamer.  Anemia of chronic disease -monitor intermittently, hemoglobin is stable at 9.1.  Continue Aranesp. Recent Labs    01/16/21 0512 01/16/21 0821 02/03/21 1244 02/05/21 0641 02/15/21 0552 02/16/21 1204 02/16/21 1813 02/17/21 1312 02/19/21 0836  HGB  --    < > 8.4*   < > 9.1* 9.3* 9.7* 9.2* 9.4*  MCV  --    < > 97.0   < >  99.3 96.8 97.1 99.3 98.7  FERRITIN 1,976*  --   --   --   --   --   --   --   --   TIBC  --   --  259  --   --   --   --   --   --   IRON  --   --  88  --   --   --   --   --   --    < > = values in this interval not displayed.   Hyponatremia -patient on hemodialysis, nephrology following. Recent Labs  Lab 02/16/21 1204 02/17/21 1312 02/19/21 0837  NA 132* 134* 135   Hypertension -Blood pressure controlled on clonidine and Coreg.  Peripheral artery disease -hx of Fem-pop bypass. -Has history of left transmetatarsal amputation.  Mobility: Encourage ambulation but she has transmetatarsal amputation Code Status:   Code Status: Full Code  Nutritional status: Body mass index is 38.22 kg/m.     Diet Order            Diet renal with fluid restriction Fluid restriction: 1200 mL Fluid; Room service appropriate? Yes; Fluid consistency: Thin  Diet effective now                 DVT prophylaxis: heparin injection 5,000 Units Start: 01/26/21 0600   Antimicrobials:  None Fluid: None Consultants: Nephrology Family Communication:  None at bedside  Status is: Inpatient Dispo: The patient is: Homeless  Anticipated d/c is to: SNF              Anticipated d/c date is: > 3 days              Patient currently is not medically stable to d/c.   Difficult to place patient Yes  Infusions:  . sodium chloride      Scheduled Meds: . carvedilol  25 mg Oral BID WC  . Chlorhexidine Gluconate Cloth  6 each Topical Q0600  . cloNIDine  0.3 mg Oral TID  . darbepoetin (ARANESP) injection - DIALYSIS  150 mcg Intravenous Q Mon-HD  . heparin  5,000 Units Subcutaneous Q8H  . multivitamin  1 tablet Oral QHS  . senna  1 tablet Oral BID  . sevelamer carbonate  3,200 mg Oral TID WC    Antimicrobials: Anti-infectives (From admission, onward)   Start     Dose/Rate Route Frequency Ordered Stop   01/23/21 1400  amoxicillin-clavulanate (AUGMENTIN) 500-125 MG per tablet 500 mg         500 mg Oral Every 24 hours 01/23/21 1124 01/25/21 1657   01/23/21 1015  amoxicillin-clavulanate (AUGMENTIN) 875-125 MG per tablet 1 tablet  Status:  Discontinued        1 tablet Oral Every 12 hours 01/23/21 1002 01/23/21 1124   01/20/21 0845  vancomycin (VANCOCIN) 1-5 GM/200ML-% IVPB       Note to Pharmacy: Judieth Keens  : cabinet override      01/20/21 0845 01/20/21 2059   01/18/21 1200  vancomycin (VANCOCIN) IVPB 1000 mg/200 mL premix  Status:  Discontinued        1,000 mg 200 mL/hr over 60 Minutes Intravenous Every M-W-F (Hemodialysis) 01/16/21 1017 01/21/21 1324   01/17/21 2200  Ampicillin-Sulbactam (UNASYN) 3 g in sodium chloride 0.9 % 100 mL IVPB  Status:  Discontinued        3 g 200 mL/hr over 30 Minutes Intravenous Daily at bedtime 01/16/21 1058 01/23/21 1002   01/17/21 1000  remdesivir 100 mg in sodium chloride 0.9 % 100 mL IVPB       "Followed by" Linked Group Details   100 mg 200 mL/hr over 30 Minutes Intravenous Daily 01/15/21 2357 01/18/21 0918   01/16/21 1145  Ampicillin-Sulbactam (UNASYN) 3 g in sodium chloride 0.9 % 100 mL IVPB        3 g 200 mL/hr over 30 Minutes Intravenous  Once 01/16/21 1058 01/16/21 1417   01/16/21 1100  vancomycin (VANCOREADY) IVPB 1500 mg/300 mL        1,500 mg 150 mL/hr over 120 Minutes Intravenous  Once 01/16/21 1017 01/16/21 1849   01/16/21 0300  remdesivir 200 mg in sodium chloride 0.9% 250 mL IVPB       "Followed by" Linked Group Details   200 mg 580 mL/hr over 30 Minutes Intravenous Once 01/15/21 2357 01/16/21 0543   01/15/21 2345  amoxicillin-clavulanate (AUGMENTIN) 875-125 MG per tablet 1 tablet  Status:  Discontinued        1 tablet Oral Every 12 hours 01/15/21 2334 01/15/21 2357   01/15/21 1800  vancomycin (VANCOCIN) IVPB 1000 mg/200 mL premix        1,000 mg 200 mL/hr over 60 Minutes Intravenous  Once 01/15/21 1753 01/15/21 2120      PRN meds: sodium chloride, acetaminophen **OR** [DISCONTINUED] acetaminophen,  HYDROcodone-acetaminophen   Objective: Vitals:   02/19/21 1120 02/19/21 1256  BP: 109/66 106/64  Pulse: 75 81  Resp: 16 18  Temp: 98.7 F (  37.1 C) 98.3 F (36.8 C)  SpO2: 96% 100%    Intake/Output Summary (Last 24 hours) at 02/19/2021 1436 Last data filed at 02/19/2021 1300 Gross per 24 hour  Intake 1100 ml  Output 2000 ml  Net -900 ml   Filed Weights   02/17/21 2111 02/19/21 0758 02/19/21 1120  Weight: 99.5 kg 103.2 kg 101 kg   Weight change:  Body mass index is 38.22 kg/m.   Physical Exam: General exam: Pleasant young African-American female, obese, not in distress Skin: No rashes, lesions or ulcers. HEENT: Atraumatic, normocephalic, no obvious bleeding Lungs: Clear to auscultation bilaterally CVS: Regular rate and rhythm, no murmur GI/Abd soft, nontender, nondistended, bowel sound present CNS: Alert, awake, oriented x3 Psychiatry: Depressed look Extremities: No pedal edema, no calf tenderness  Data Review: I have personally reviewed the laboratory data and studies available.  Recent Labs  Lab 02/15/21 0552 02/16/21 1204 02/16/21 1813 02/17/21 1312 02/19/21 0836  WBC 6.6 7.9 7.5 7.3 7.9  HGB 9.1* 9.3* 9.7* 9.2* 9.4*  HCT 30.0* 29.8* 30.1* 29.6* 30.9*  MCV 99.3 96.8 97.1 99.3 98.7  PLT 194 205 188 175 200   Recent Labs  Lab 02/16/21 1204 02/17/21 1312 02/19/21 0837  NA 132* 134* 135  K 4.2 3.8 3.7  CL 91* 96* 94*  CO2 '25 24 25  '$ GLUCOSE 128* 144* 121*  BUN 76* 44* 46*  CREATININE 16.11* 11.13* 11.68*  CALCIUM 10.3 10.1 10.7*  PHOS 7.0* 5.4* 5.0*    F/u labs  Unresulted Labs (From admission, onward)         None      Signed, Terrilee Croak, MD [facility] 02/19/2021

## 2021-02-20 DIAGNOSIS — N611 Abscess of the breast and nipple: Secondary | ICD-10-CM | POA: Diagnosis not present

## 2021-02-20 NOTE — Progress Notes (Signed)
PROGRESS NOTE  Rebekah Burton  DOB: January 14, 1976  PCP: Trey Sailors, [place] [id number]  DOA: 01/15/2021  LOS: 36 days   Chief Complaint  Patient presents with  . Abscess   Brief narrative: Rebekah Burton is a 45 y.o. female with PMH significant for ESRD on HD MWF, blindness, hypertension, PAD. Patient presented to the ED on 01/15/2021 with breast abscess, underwent I&D by general surgery.  She was initially treated with IV antibiotics later completed a course with oral antibiotics.  She was also found to be incidental Covid positive.  Now she is off contact precautions.  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  IR and vascular were consulted.  aVF is now working. Dialysis per nephrology. Patient is homeless rhythm with her disposition is difficult.  Subjective: Patient was seen and examined this morning, Not in distress.  No new symptoms.  Obese middle-aged African-American female.  Assessment/Plan: Breast abscess -s/p incision drainage done by general surgery. She was started on Unasyn, pulmonary cultures grew gram-positive cocci.  She was transitioned to Augmentin and has completed the course.  Incidental COVID-19 -patient is asymptomatic, no evidence of pulmonary involvement.  -Off isolation. Currently on room air.  ESRD -nephrology was consulted, had issues with cannulation of aVF.  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  aVF is now working.  Dialysis per nephrology.  Continue sevelamer.  Anemia of chronic disease -monitor intermittently, hemoglobin is stable at 9.1.  Continue Aranesp. Recent Labs    01/16/21 0512 01/16/21 0821 02/03/21 1244 02/05/21 0641 02/15/21 0552 02/16/21 1204 02/16/21 1813 02/17/21 1312 02/19/21 0836  HGB  --    < > 8.4*   < > 9.1* 9.3* 9.7* 9.2* 9.4*  MCV  --    < > 97.0   < > 99.3 96.8  97.1 99.3 98.7  FERRITIN 1,976*  --   --   --   --   --   --   --   --   TIBC  --   --  259  --   --   --   --   --   --   IRON  --   --  88  --   --   --   --   --   --    < > = values in this interval not displayed.   Hyponatremia -patient on hemodialysis, nephrology following. Recent Labs  Lab 02/16/21 1204 02/17/21 1312 02/19/21 0837  NA 132* 134* 135   Hypertension -Blood pressure controlled on clonidine and Coreg.  Peripheral artery disease -hx of Fem-pop bypass. -Has history of left transmetatarsal amputation.  Mobility: Encourage ambulation but she has transmetatarsal amputation Code Status:   Code Status: Full Code  Nutritional status: Body mass index is 38.22 kg/m.     Diet Order            Diet renal with fluid restriction Fluid restriction: 1200 mL Fluid; Room service appropriate? Yes; Fluid consistency: Thin  Diet effective now                 DVT prophylaxis: heparin injection 5,000 Units Start: 01/26/21 0600   Antimicrobials:  None Fluid: None Consultants: Nephrology Family Communication:  None at bedside  Status is: Inpatient Dispo: The patient is: Homeless              Anticipated d/c  is to: SNF              Patient currently is medically stable to d/c.   Difficult to place patient Yes  Infusions:  . sodium chloride      Scheduled Meds: . carvedilol  25 mg Oral BID WC  . Chlorhexidine Gluconate Cloth  6 each Topical Q0600  . cloNIDine  0.3 mg Oral TID  . darbepoetin (ARANESP) injection - DIALYSIS  150 mcg Intravenous Q Mon-HD  . heparin  5,000 Units Subcutaneous Q8H  . multivitamin  1 tablet Oral QHS  . senna  1 tablet Oral BID  . sevelamer carbonate  3,200 mg Oral TID WC    Antimicrobials: Anti-infectives (From admission, onward)   Start     Dose/Rate Route Frequency Ordered Stop   01/23/21 1400  amoxicillin-clavulanate (AUGMENTIN) 500-125 MG per tablet 500 mg        500 mg Oral Every 24 hours 01/23/21 1124 01/25/21 1657    01/23/21 1015  amoxicillin-clavulanate (AUGMENTIN) 875-125 MG per tablet 1 tablet  Status:  Discontinued        1 tablet Oral Every 12 hours 01/23/21 1002 01/23/21 1124   01/20/21 0845  vancomycin (VANCOCIN) 1-5 GM/200ML-% IVPB       Note to Pharmacy: Judieth Keens  : cabinet override      01/20/21 0845 01/20/21 2059   01/18/21 1200  vancomycin (VANCOCIN) IVPB 1000 mg/200 mL premix  Status:  Discontinued        1,000 mg 200 mL/hr over 60 Minutes Intravenous Every M-W-F (Hemodialysis) 01/16/21 1017 01/21/21 1324   01/17/21 2200  Ampicillin-Sulbactam (UNASYN) 3 g in sodium chloride 0.9 % 100 mL IVPB  Status:  Discontinued        3 g 200 mL/hr over 30 Minutes Intravenous Daily at bedtime 01/16/21 1058 01/23/21 1002   01/17/21 1000  remdesivir 100 mg in sodium chloride 0.9 % 100 mL IVPB       "Followed by" Linked Group Details   100 mg 200 mL/hr over 30 Minutes Intravenous Daily 01/15/21 2357 01/18/21 0918   01/16/21 1145  Ampicillin-Sulbactam (UNASYN) 3 g in sodium chloride 0.9 % 100 mL IVPB        3 g 200 mL/hr over 30 Minutes Intravenous  Once 01/16/21 1058 01/16/21 1417   01/16/21 1100  vancomycin (VANCOREADY) IVPB 1500 mg/300 mL        1,500 mg 150 mL/hr over 120 Minutes Intravenous  Once 01/16/21 1017 01/16/21 1849   01/16/21 0300  remdesivir 200 mg in sodium chloride 0.9% 250 mL IVPB       "Followed by" Linked Group Details   200 mg 580 mL/hr over 30 Minutes Intravenous Once 01/15/21 2357 01/16/21 0543   01/15/21 2345  amoxicillin-clavulanate (AUGMENTIN) 875-125 MG per tablet 1 tablet  Status:  Discontinued        1 tablet Oral Every 12 hours 01/15/21 2334 01/15/21 2357   01/15/21 1800  vancomycin (VANCOCIN) IVPB 1000 mg/200 mL premix        1,000 mg 200 mL/hr over 60 Minutes Intravenous  Once 01/15/21 1753 01/15/21 2120      PRN meds: sodium chloride, acetaminophen **OR** [DISCONTINUED] acetaminophen, HYDROcodone-acetaminophen   Objective: Vitals:   02/19/21 1720  02/19/21 2036  BP: 127/76 137/87  Pulse: 80 80  Resp: 17 18  Temp: 98.1 F (36.7 C) 99.2 F (37.3 C)  SpO2: 99% 100%    Intake/Output Summary (Last 24 hours) at 02/20/2021 1051 Last data  filed at 02/20/2021 0600 Gross per 24 hour  Intake 1080 ml  Output 2000 ml  Net -920 ml   Filed Weights   02/19/21 0758 02/19/21 1120 02/19/21 2036  Weight: 103.2 kg 101 kg 101 kg   Weight change:  Body mass index is 38.22 kg/m.   Physical Exam: General exam: Pleasant young African-American female, obese, not in distress Skin: No rashes, lesions or ulcers. HEENT: Atraumatic, normocephalic, no obvious bleeding Lungs: Clear to auscultation bilaterally CVS: Regular rate and rhythm, no murmur GI/Abd soft, nontender, nondistended, bowel sound present CNS: Alert, awake, oriented x3 Psychiatry: Depressed look Extremities: No pedal edema, no calf tenderness  Data Review: I have personally reviewed the laboratory data and studies available.  Recent Labs  Lab 02/15/21 0552 02/16/21 1204 02/16/21 1813 02/17/21 1312 02/19/21 0836  WBC 6.6 7.9 7.5 7.3 7.9  HGB 9.1* 9.3* 9.7* 9.2* 9.4*  HCT 30.0* 29.8* 30.1* 29.6* 30.9*  MCV 99.3 96.8 97.1 99.3 98.7  PLT 194 205 188 175 200   Recent Labs  Lab 02/16/21 1204 02/17/21 1312 02/19/21 0837  NA 132* 134* 135  K 4.2 3.8 3.7  CL 91* 96* 94*  CO2 '25 24 25  '$ GLUCOSE 128* 144* 121*  BUN 76* 44* 46*  CREATININE 16.11* 11.13* 11.68*  CALCIUM 10.3 10.1 10.7*  PHOS 7.0* 5.4* 5.0*    F/u labs  Unresulted Labs (From admission, onward)         None      Signed, Terrilee Croak, MD [facility] 02/20/2021

## 2021-02-20 NOTE — Progress Notes (Signed)
  [facility] Progress Note   Subjective:   Seen in room - says dialysis went fine yesterday, no CP or dyspnea. Awaiting SNF placement.  Objective Vitals:   02/19/21 1120 02/19/21 1256 02/19/21 1720 02/19/21 2036  BP: 109/66 106/64 127/76 137/87  Pulse: 75 81 80 80  Resp: '16 18 17 18  '$ Temp: 98.7 F (37.1 C) 98.3 F (36.8 C) 98.1 F (36.7 C) 99.2 F (37.3 C)  TempSrc: Oral   Oral  SpO2: 96% 100% 99% 100%  Weight: 101 kg   101 kg  Height:       Physical Exam General: Well appearing, NAD. Room air. + vision impairment. Heart: RRR; no murmur Lungs: CTAB; no rales or wheezing Abdomen: soft Extremities: No LE edema Dialysis Access: L AVF + bruit  Additional Objective Labs: Basic Metabolic Panel: Recent Labs  Lab 02/16/21 1204 02/17/21 1312 02/19/21 0837  NA 132* 134* 135  K 4.2 3.8 3.7  CL 91* 96* 94*  CO2 '25 24 25  '$ GLUCOSE 128* 144* 121*  BUN 76* 44* 46*  CREATININE 16.11* 11.13* 11.68*  CALCIUM 10.3 10.1 10.7*  PHOS 7.0* 5.4* 5.0*   Liver Function Tests: Recent Labs  Lab 02/16/21 1204 02/17/21 1312 02/19/21 0837  ALBUMIN 3.3* 3.2* 3.2*   CBC: Recent Labs  Lab 02/15/21 0552 02/16/21 1204 02/16/21 1813 02/17/21 1312 02/19/21 0836  WBC 6.6 7.9 7.5 7.3 7.9  HGB 9.1* 9.3* 9.7* 9.2* 9.4*  HCT 30.0* 29.8* 30.1* 29.6* 30.9*  MCV 99.3 96.8 97.1 99.3 98.7  PLT 194 205 188 175 200   Medications: . sodium chloride     . carvedilol  25 mg Oral BID WC  . Chlorhexidine Gluconate Cloth  6 each Topical Q0600  . cloNIDine  0.3 mg Oral TID  . darbepoetin (ARANESP) injection - DIALYSIS  150 mcg Intravenous Q Mon-HD  . heparin  5,000 Units Subcutaneous Q8H  . multivitamin  1 tablet Oral QHS  . senna  1 tablet Oral BID  . sevelamer carbonate  3,200 mg Oral TID WC    Dialysis Orders: [place] 4h 76mn 2/2.5 bath 105kg AVF Heparin ? parsabiv 15 mg three times a week with HD mircera 150 mcg every 2 weeks - last given 01/13/21 hectorol 3  mcg three times a week  Assessment/Plan: 1. ESRD: Continue HD per MWF schedule, next 2/28. 2. HD vascular access: Recurrentcannulationissues w/AVF.IR consulted 01/25/21 which revealed significant pseudoaneurysm s/p thrombin injection and near complete thrombosis of pseudoaneurysm per IR.Per patient infiltrated AVF on 2/16also with difficulty with cannulation on 02/15/21.Cannulation currently successful. Continue to monitor access. 3. Left breast abscess - s/p I&D, completed course of Augmentin. 4. Covid-19: Incidentally found, asymptomatic. Out of isolation window now. 5. HTN: BP controlled, edema resolved - now below EDW, will need to be lowered at d/c. 6. Anemia of ESRD: Hgb9.4.ContinueAranesp 1548m weekly. Tsat 34%, ferritin [id number]. Metabolic bone disease- Phos improving - now at goal, continue binders.Corrected calcium high, hectorol on hold, use low Ca bath. Not able to get parsabiv here.  8. Social Issues:Per Hospitalist note, anticipated d/c to SNF. SW involved.   KaVeneta PentonPA-C 02/20/2021, 9:43 AM  [employer]

## 2021-02-20 NOTE — Plan of Care (Signed)
  Problem: Pain Managment: Goal: General experience of comfort will improve Outcome: Progressing   Problem: Safety: Goal: Ability to remain free from injury will improve Outcome: Progressing   

## 2021-02-21 DIAGNOSIS — N611 Abscess of the breast and nipple: Secondary | ICD-10-CM | POA: Diagnosis not present

## 2021-02-21 NOTE — Progress Notes (Addendum)
PROGRESS NOTE  Rebekah Burton  DOB: 08-11-1976  PCP: Trey Sailors, [place] [id number]  DOA: 01/15/2021  LOS: 37 days   Chief Complaint  Patient presents with  . Abscess   Brief narrative: Bandi Renslow is a 45 y.o. female with PMH significant for ESRD on HD MWF, blindness, hypertension, PAD. Patient presented to the ED on 01/15/2021 with breast abscess, underwent I&D by general surgery.  She was initially treated with IV antibiotics later completed a course with oral antibiotics.  She was also found to be incidental Covid positive.  Now she is off contact precautions.  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  IR and vascular were consulted.  aVF is now working. Dialysis per nephrology. Patient is homeless rhythm with her disposition is difficult.  Subjective: Patient was seen and examined this morning, Not in distress.  Lying on bed.  Pending placement  Assessment/Plan: Breast abscess -s/p incision drainage done by general surgery. She was started on Unasyn, pulmonary cultures grew gram-positive cocci.  She was transitioned to Augmentin and has completed the course.  Incidental COVID-19 -patient is asymptomatic, no evidence of pulmonary involvement.  -Off isolation. Currently on room air.  ESRD -nephrology was consulted, had issues with cannulation of aVF.  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  aVF is now working.  Dialysis per nephrology.  Continue sevelamer.  Anemia of chronic disease -monitor intermittently, hemoglobin is stable at 9.1.  Continue Aranesp. Recent Labs    01/16/21 0512 01/16/21 0821 02/03/21 1244 02/05/21 0641 02/15/21 0552 02/16/21 1204 02/16/21 1813 02/17/21 1312 02/19/21 0836  HGB  --    < > 8.4*   < > 9.1* 9.3* 9.7* 9.2* 9.4*  MCV  --    < > 97.0   < > 99.3 96.8 97.1 99.3 98.7  FERRITIN  1,976*  --   --   --   --   --   --   --   --   TIBC  --   --  259  --   --   --   --   --   --   IRON  --   --  88  --   --   --   --   --   --    < > = values in this interval not displayed.   Hyponatremia -patient on hemodialysis, nephrology following. Recent Labs  Lab 02/16/21 1204 02/17/21 1312 02/19/21 0837  NA 132* 134* 135   Hypertension -Blood pressure controlled on clonidine and Coreg.  Peripheral artery disease -hx of Fem-pop bypass. -Has history of left transmetatarsal amputation.  Mobility: Encourage ambulation but she has transmetatarsal amputation Code Status:   Code Status: Full Code  Nutritional status: Body mass index is 38.22 kg/m.     Diet Order            Diet renal with fluid restriction Fluid restriction: 1200 mL Fluid; Room service appropriate? Yes; Fluid consistency: Thin  Diet effective now                 DVT prophylaxis: heparin injection 5,000 Units Start: 01/26/21 0600   Antimicrobials:  None Fluid: None Consultants: Nephrology Family Communication:  None at bedside  Status is: Inpatient Dispo: The patient is: Homeless              Anticipated d/c is to:  SNF              Patient currently is medically stable to d/c.   Difficult to place patient Yes  Infusions:  . sodium chloride      Scheduled Meds: . carvedilol  25 mg Oral BID WC  . Chlorhexidine Gluconate Cloth  6 each Topical Q0600  . cloNIDine  0.3 mg Oral TID  . darbepoetin (ARANESP) injection - DIALYSIS  150 mcg Intravenous Q Mon-HD  . heparin  5,000 Units Subcutaneous Q8H  . multivitamin  1 tablet Oral QHS  . senna  1 tablet Oral BID  . sevelamer carbonate  3,200 mg Oral TID WC    Antimicrobials: Anti-infectives (From admission, onward)   Start     Dose/Rate Route Frequency Ordered Stop   01/23/21 1400  amoxicillin-clavulanate (AUGMENTIN) 500-125 MG per tablet 500 mg        500 mg Oral Every 24 hours 01/23/21 1124 01/25/21 1657   01/23/21 1015   amoxicillin-clavulanate (AUGMENTIN) 875-125 MG per tablet 1 tablet  Status:  Discontinued        1 tablet Oral Every 12 hours 01/23/21 1002 01/23/21 1124   01/20/21 0845  vancomycin (VANCOCIN) 1-5 GM/200ML-% IVPB       Note to Pharmacy: Judieth Keens  : cabinet override      01/20/21 0845 01/20/21 2059   01/18/21 1200  vancomycin (VANCOCIN) IVPB 1000 mg/200 mL premix  Status:  Discontinued        1,000 mg 200 mL/hr over 60 Minutes Intravenous Every M-W-F (Hemodialysis) 01/16/21 1017 01/21/21 1324   01/17/21 2200  Ampicillin-Sulbactam (UNASYN) 3 g in sodium chloride 0.9 % 100 mL IVPB  Status:  Discontinued        3 g 200 mL/hr over 30 Minutes Intravenous Daily at bedtime 01/16/21 1058 01/23/21 1002   01/17/21 1000  remdesivir 100 mg in sodium chloride 0.9 % 100 mL IVPB       "Followed by" Linked Group Details   100 mg 200 mL/hr over 30 Minutes Intravenous Daily 01/15/21 2357 01/18/21 0918   01/16/21 1145  Ampicillin-Sulbactam (UNASYN) 3 g in sodium chloride 0.9 % 100 mL IVPB        3 g 200 mL/hr over 30 Minutes Intravenous  Once 01/16/21 1058 01/16/21 1417   01/16/21 1100  vancomycin (VANCOREADY) IVPB 1500 mg/300 mL        1,500 mg 150 mL/hr over 120 Minutes Intravenous  Once 01/16/21 1017 01/16/21 1849   01/16/21 0300  remdesivir 200 mg in sodium chloride 0.9% 250 mL IVPB       "Followed by" Linked Group Details   200 mg 580 mL/hr over 30 Minutes Intravenous Once 01/15/21 2357 01/16/21 0543   01/15/21 2345  amoxicillin-clavulanate (AUGMENTIN) 875-125 MG per tablet 1 tablet  Status:  Discontinued        1 tablet Oral Every 12 hours 01/15/21 2334 01/15/21 2357   01/15/21 1800  vancomycin (VANCOCIN) IVPB 1000 mg/200 mL premix        1,000 mg 200 mL/hr over 60 Minutes Intravenous  Once 01/15/21 1753 01/15/21 2120      PRN meds: sodium chloride, acetaminophen **OR** [DISCONTINUED] acetaminophen, HYDROcodone-acetaminophen   Objective: Vitals:   02/21/21 0548 02/21/21 1002  BP:  (!) 147/76 137/75  Pulse: 72 76  Resp: 18 18  Temp: (!) 97.5 F (36.4 C) 98 F (36.7 C)  SpO2: 100% 100%    Intake/Output Summary (Last 24 hours) at 02/21/2021 1310 Last data  filed at 02/21/2021 0920 Gross per 24 hour  Intake 960 ml  Output 0 ml  Net 960 ml   Filed Weights   02/19/21 0758 02/19/21 1120 02/19/21 2036  Weight: 103.2 kg 101 kg 101 kg   Weight change:  Body mass index is 38.22 kg/m.   Physical Exam: General exam: Pleasant young African-American female, obese, not in distress Skin: No rashes, lesions or ulcers. HEENT: Atraumatic, normocephalic, no obvious bleeding Lungs: Clear to auscultation bilaterally CVS: Regular rate and rhythm, no murmur GI/Abd soft, nontender, nondistended, bowel sound present CNS: Alert, awake, oriented x3 Psychiatry: Depressed look Extremities: No pedal edema, no calf tenderness  Data Review: I have personally reviewed the laboratory data and studies available.  Recent Labs  Lab 02/15/21 0552 02/16/21 1204 02/16/21 1813 02/17/21 1312 02/19/21 0836  WBC 6.6 7.9 7.5 7.3 7.9  HGB 9.1* 9.3* 9.7* 9.2* 9.4*  HCT 30.0* 29.8* 30.1* 29.6* 30.9*  MCV 99.3 96.8 97.1 99.3 98.7  PLT 194 205 188 175 200   Recent Labs  Lab 02/16/21 1204 02/17/21 1312 02/19/21 0837  NA 132* 134* 135  K 4.2 3.8 3.7  CL 91* 96* 94*  CO2 '25 24 25  '$ GLUCOSE 128* 144* 121*  BUN 76* 44* 46*  CREATININE 16.11* 11.13* 11.68*  CALCIUM 10.3 10.1 10.7*  PHOS 7.0* 5.4* 5.0*    F/u labs  Unresulted Labs (From admission, onward)          Start     Ordered   Signed and Held  Renal function panel  Once,   R       Question:  Specimen collection method  Answer:  Lab=Lab collect   Signed and Held   Signed and Held  CBC  Once,   R       Question:  Specimen collection method  Answer:  Lab=Lab collect   Signed and Held          Signed, Terrilee Croak, MD [facility] 02/21/2021

## 2021-02-21 NOTE — Plan of Care (Signed)
  Problem: Pain Managment: Goal: General experience of comfort will improve Outcome: Progressing   Problem: Safety: Goal: Ability to remain free from injury will improve Outcome: Progressing   

## 2021-02-21 NOTE — Progress Notes (Signed)
  [facility] Progress Note   Subjective:  Seen in room - no overnight events noted. No CP/dyspnea. Placement pending.  Objective Vitals:   02/20/21 1002 02/20/21 1648 02/20/21 2055 02/21/21 0548  BP: 132/80 131/84 130/74 (!) 147/76  Pulse: 78 78 85 72  Resp: '18 18 18 18  '$ Temp: 98.8 F (37.1 C) 99 F (37.2 C) 98.6 F (37 C) (!) 97.5 F (36.4 C)  TempSrc: Oral Oral Oral Oral  SpO2: 90% 97% 100% 100%  Weight:      Height:       Physical Exam General: Well appearing, NAD. Room air. + vision impairment. Heart: RRR; no murmur Lungs: CTAB; no rales or wheezing Abdomen: soft Extremities: No LE edema Dialysis Access: L AVF + bruit  Additional Objective Labs: Basic Metabolic Panel: Recent Labs  Lab 02/16/21 1204 02/17/21 1312 02/19/21 0837  NA 132* 134* 135  K 4.2 3.8 3.7  CL 91* 96* 94*  CO2 '25 24 25  '$ GLUCOSE 128* 144* 121*  BUN 76* 44* 46*  CREATININE 16.11* 11.13* 11.68*  CALCIUM 10.3 10.1 10.7*  PHOS 7.0* 5.4* 5.0*   Liver Function Tests: Recent Labs  Lab 02/16/21 1204 02/17/21 1312 02/19/21 0837  ALBUMIN 3.3* 3.2* 3.2*   CBC: Recent Labs  Lab 02/15/21 0552 02/16/21 1204 02/16/21 1813 02/17/21 1312 02/19/21 0836  WBC 6.6 7.9 7.5 7.3 7.9  HGB 9.1* 9.3* 9.7* 9.2* 9.4*  HCT 30.0* 29.8* 30.1* 29.6* 30.9*  MCV 99.3 96.8 97.1 99.3 98.7  PLT 194 205 188 175 200   Medications: . sodium chloride     . carvedilol  25 mg Oral BID WC  . Chlorhexidine Gluconate Cloth  6 each Topical Q0600  . cloNIDine  0.3 mg Oral TID  . darbepoetin (ARANESP) injection - DIALYSIS  150 mcg Intravenous Q Mon-HD  . heparin  5,000 Units Subcutaneous Q8H  . multivitamin  1 tablet Oral QHS  . senna  1 tablet Oral BID  . sevelamer carbonate  3,200 mg Oral TID WC    Dialysis Orders: [facility] 4h 24mn 2/2.5 bath 105kg AVF Heparin ? parsabiv 15 mg three times a week with HD mircera 150 mcg every 2 weeks - last given 01/13/21 hectorol 3 mcg three times  a week  Assessment/Plan: 1. ESRD: Continue HD per MWF schedule, next tomorrow (2/28). 2. HD vascular access: Recurrentcannulationissues w/AVF.IR completed f'gram 01/25/21 showing significant pseudoaneurysm s/p thrombin injection and near complete thrombosis of pseudoaneurysm per IR.Per patient infiltrated AVF on 2/16also with difficulty with cannulation on 02/15/21.Cannulation issues now appear to have resolved, will continue to monitor. 3. Left breast abscess - s/p I&D, completed course of Augmentin. 4. Covid-19: Incidentally found, asymptomatic. Out of isolation window now. 5. HTN: BP controlled, edema resolved - now below EDW, will need to be lowered at d/c. 6. Anemia of ESRD: Hgb9.4.ContinueAranesp 1568m weekly. Tsat 34%, ferritin [id number]. Metabolic bone disease- Phos improving - now at goal, continue binders.Corrected calcium high, hectorol on hold, use low Ca bath. Not able to get parsabiv here.  8. Social Issues:Per Hospitalist note, anticipated d/c to SNF. SW involved.   KaVeneta PentonPA-C 02/21/2021, 9:12 AM  [employer]

## 2021-02-22 DIAGNOSIS — N611 Abscess of the breast and nipple: Secondary | ICD-10-CM | POA: Diagnosis not present

## 2021-02-22 LAB — RENAL FUNCTION PANEL
Albumin: 3 g/dL — ABNORMAL LOW (ref 3.5–5.0)
Anion gap: 15 (ref 5–15)
BUN: 67 mg/dL — ABNORMAL HIGH (ref 6–20)
CO2: 24 mmol/L (ref 22–32)
Calcium: 10.7 mg/dL — ABNORMAL HIGH (ref 8.9–10.3)
Chloride: 94 mmol/L — ABNORMAL LOW (ref 98–111)
Creatinine, Ser: 13.61 mg/dL — ABNORMAL HIGH (ref 0.44–1.00)
GFR, Estimated: 3 mL/min — ABNORMAL LOW (ref >60)
Glucose, Bld: 106 mg/dL — ABNORMAL HIGH (ref 70–99)
Phosphorus: 5.3 mg/dL — ABNORMAL HIGH (ref 2.5–4.6)
Potassium: 4.5 mmol/L (ref 3.5–5.1)
Sodium: 133 mmol/L — ABNORMAL LOW (ref 135–145)

## 2021-02-22 LAB — CBC
HCT: 30.6 % — ABNORMAL LOW (ref 36.0–46.0)
Hemoglobin: 9.3 g/dL — ABNORMAL LOW (ref 12.0–15.0)
MCH: 30 pg (ref 26.0–34.0)
MCHC: 30.4 g/dL (ref 30.0–36.0)
MCV: 98.7 fL (ref 80.0–100.0)
Platelets: 231 10*3/uL (ref 150–400)
RBC: 3.1 MIL/uL — ABNORMAL LOW (ref 3.87–5.11)
RDW: 18 % — ABNORMAL HIGH (ref 11.5–15.5)
WBC: 9.3 10*3/uL (ref 4.0–10.5)
nRBC: 0 % (ref 0.0–0.2)

## 2021-02-22 MED ORDER — HEPARIN SODIUM (PORCINE) 1000 UNIT/ML DIALYSIS: 20.0000 [IU]/kg | INTRAMUSCULAR | Status: DC | PRN

## 2021-02-22 MED ORDER — DARBEPOETIN ALFA 150 MCG/0.3ML IJ SOSY
PREFILLED_SYRINGE | INTRAMUSCULAR | Status: AC
  Filled 2021-02-22: qty 0.3

## 2021-02-22 NOTE — Plan of Care (Signed)
?  Problem: Fluid Volume: ?Goal: Compliance with measures to maintain balanced fluid volume will improve ?Outcome: Progressing ?  ?Problem: Clinical Measurements: ?Goal: Complications related to the disease process, condition or treatment will be avoided or minimized ?Outcome: Progressing ?  ?

## 2021-02-22 NOTE — Procedures (Signed)
Patient was seen on dialysis and the procedure was supervised.  BFR 400  Via AVF BP is  115/45.   Patient appears to be tolerating treatment well  Louis Meckel 02/22/2021

## 2021-02-22 NOTE — NC FL2 (Addendum)
[place] MEDICAID FL2 LEVEL OF CARE SCREENING TOOL     IDENTIFICATION  Patient Name: Rebekah Burton Birthdate: 01/31/1976 Sex: female Admission Date (Current Location): 01/15/2021  [facility] and [place] Number:  Rebekah Burton and Address:  Rebekah Burton. Rebekah Burton, [place] [address], [place],  [address]      Rebekah Burton Number: [id number]  Attending Physician Name and Address:  Terrilee Croak, MD  Relative Name and Phone Number:       Current Level of Care: Hospital Recommended Level of Care: [place] Prior Approval Number:    Date Approved/Denied:   PASRR Number: [id number]  Discharge Plan:  (To be determined)    Current Diagnoses: Patient Active Problem List   Diagnosis Date Noted  . Breast abscess 01/15/2021  . COVID-19 virus infection 01/15/2021  . Breast abscess of female 01/15/2021  . Cellulitis of left lower extremity 02/05/2019  . Post-operative pain   . Acute blood loss anemia   . S/P transmetatarsal amputation of foot, left ([place])   . Ischemic ulcer of left foot ([place]) 01/16/2019  . ESRD (end stage renal disease) ([place]) 01/16/2019  . HTN (hypertension) 01/16/2019  . 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  . PAD (peripheral artery disease) ([place]) 01/16/2019    Orientation RESPIRATION BLADDER Height & Weight     Self,Time,Situation,Place  Normal Continent Weight: 224 lb 13.9 oz (102 kg) Height:  '5\' 4"'$  (162.6 cm)  BEHAVIORAL SYMPTOMS/MOOD NEUROLOGICAL BOWEL NUTRITION STATUS      Continent Diet  AMBULATORY STATUS COMMUNICATION OF NEEDS Skin   Independent Verbally Normal,Surgical wounds (L breast surgical incision)                       Personal Care Assistance Level of Assistance  Bathing,Feeding,Dressing Bathing Assistance: Independent Feeding assistance: Independent Dressing Assistance: Independent     Functional Limitations Info  Sight,Hearing,Speech Sight  Info: Impaired Hearing Info: Adequate Speech Info: Adequate    SPECIAL CARE FACTORS FREQUENCY                       Contractures Contractures Info: Not present    Additional Factors Info  Allergies,Code Status Code Status Info: Full Allergies Info: Percocet           Current Medications (02/22/2021):  This is Rebekah current hospital active medication list Current Facility-Administered Medications  Medication Dose Route Frequency Rebekah Burton Last Rate Last Admin  . 0.9 %  sodium chloride infusion  250 mL Intravenous PRN Rebekah Burton, Rebekah Knuckles, MD      . acetaminophen (TYLENOL) tablet 650 mg  650 mg Oral Q6H PRN Rebekah Rhymes, MD   650 mg at 02/12/21 1310  . carvedilol (COREG) tablet 25 mg  25 mg Oral BID WC Rebekah Burton, Rebekah Knuckles, MD   25 mg at 02/22/21 1304  . Chlorhexidine Gluconate Cloth 2 % PADS 6 each  6 each Topical Q0600 Rebekah Burton, Rebekah Burton, [place]   6 each at 02/19/21 [phone]  . cloNIDine (CATAPRES) tablet 0.3 mg  0.3 mg Oral TID Rebekah Rhymes, MD   0.3 mg at 02/22/21 1258  . Darbepoetin Alfa (ARANESP) injection 150 mcg  150 mcg Intravenous Q Mon-HD Rebekah Haber, Rebekah Burton   150 mcg at 02/22/21 [id number]  . heparin injection 5,000 Units  5,000 Units Subcutaneous Q8H Rebekah Sabal, Rebekah Burton   5,000 Units at 02/19/21 [id number]  . HYDROcodone-acetaminophen (NORCO/VICODIN) 5-325 MG per tablet 1-2 tablet  1-2 tablet  Oral Q4H PRN Rebekah Hampshire, Rebekah Burton   2 tablet at 02/22/21 1259  . multivitamin (RENA-VIT) tablet 1 tablet  1 tablet Oral QHS Rebekah Burton, Rebekah Knuckles, MD   1 tablet at 02/21/21 2205  . senna (SENOKOT) tablet 8.6 mg  1 tablet Oral BID Rebekah Burton, Rebekah Knuckles, MD   8.6 mg at 02/22/21 1258  . sevelamer carbonate (RENVELA) tablet 3,200 mg  3,200 mg Oral TID WC Rebekah Burton, Rebekah Knuckles, MD   3,200 mg at 02/22/21 1259     Discharge Medications: Please see discharge summary for a list of discharge medications.  Relevant Imaging Results:  Relevant Lab Results:   Additional Information SS# [id number];  Monday/Wednesday/Friday, Rebekah Burton, Rebekah Burton

## 2021-02-22 NOTE — Progress Notes (Signed)
  [facility] Progress Note   Subjective:  Seen in HD - no overnight events noted. No CP/dyspnea. Placement pending.  Objective Vitals:   02/22/21 0825 02/22/21 0835 02/22/21 0900 02/22/21 0930  BP: 126/72 137/74 119/71 (!) 115/45  Pulse: 76 74 78 81  Resp: 18     Temp: 97.8 F (36.6 C)     TempSrc: Oral     SpO2: 100%     Weight:      Height:       Physical Exam General: Well appearing, NAD. Room air. + vision impairment. Heart: RRR; no murmur Lungs: CTAB; no rales or wheezing Abdomen: soft Extremities: No LE edema Dialysis Access: L AVF + bruit  Additional Objective Labs: Basic Metabolic Panel: Recent Labs  Lab 02/17/21 1312 02/19/21 0837 02/22/21 0852  NA 134* 135 133*  K 3.8 3.7 4.5  CL 96* 94* 94*  CO2 '24 25 24  '$ GLUCOSE 144* 121* 106*  BUN 44* 46* 67*  CREATININE 11.13* 11.68* 13.61*  CALCIUM 10.1 10.7* 10.7*  PHOS 5.4* 5.0* 5.3*   Liver Function Tests: Recent Labs  Lab 02/17/21 1312 02/19/21 0837 02/22/21 0852  ALBUMIN 3.2* 3.2* 3.0*   CBC: Recent Labs  Lab 02/16/21 1204 02/16/21 1813 02/17/21 1312 02/19/21 0836 02/22/21 0852  WBC 7.9 7.5 7.3 7.9 9.3  HGB 9.3* 9.7* 9.2* 9.4* 9.3*  HCT 29.8* 30.1* 29.6* 30.9* 30.6*  MCV 96.8 97.1 99.3 98.7 98.7  PLT 205 188 175 200 231   Medications: . sodium chloride     . carvedilol  25 mg Oral BID WC  . Chlorhexidine Gluconate Cloth  6 each Topical Q0600  . cloNIDine  0.3 mg Oral TID  . darbepoetin (ARANESP) injection - DIALYSIS  150 mcg Intravenous Q Mon-HD  . heparin  5,000 Units Subcutaneous Q8H  . multivitamin  1 tablet Oral QHS  . senna  1 tablet Oral BID  . sevelamer carbonate  3,200 mg Oral TID WC    Dialysis Orders: [facility] 4h 18mn 2/2.5 bath 105kg AVF Heparin ? parsabiv 15 mg three times a week with HD mircera 150 mcg every 2 weeks - last given 01/13/21 hectorol 3 mcg three times a week  Assessment/Plan: 1. ESRD: Continue HD per MWF schedule, running  today, next Wed 3/2. 2. HD vascular access: Recurrentcannulationissues w/AVF.IR completed f'gram 01/25/21 showing significant pseudoaneurysm s/p thrombin injection and near complete thrombosis of pseudoaneurysm per IR.Per patient infiltrated AVF on 2/16also with difficulty with cannulation on 02/15/21.Cannulation issues now appear to have resolved, will continue to monitor. 3. Left breast abscess - s/p I&D, completed course of Augmentin. 4. Covid-19: Incidentally found, asymptomatic. Out of isolation window now. 5. HTN: BP controlled, edema resolved - now below EDW, will need to be lowered at d/c. 6. Anemia of ESRD: Hgb9.4.ContinueAranesp 1553m weekly. Tsat 34%, ferritin [id number]. Metabolic bone disease- Phos improving - now at goal, continue binders- renvela.Corrected calcium high, hectorol on hold, use low Ca bath. Not able to get parsabiv here.  8. Social Issues:Difficult situation- pt homeless. Per Hospitalist note, anticipated d/c to SNF. SW involved.  KeLouis Meckel2/28/2022, 9:47 AM  [employer]

## 2021-02-22 NOTE — Progress Notes (Signed)
CSW spoke with Rebekah Burton at [facility] who states this patient has no income and her Medicaid benefits do not cover the entire cost of ALF.  Madilyn Fireman, MSW, LCSW Transitions of Care  Clinical Social Worker II [phone]

## 2021-02-22 NOTE — Progress Notes (Signed)
PROGRESS NOTE  Rebekah Burton  DOB: [birth year]  PCP: Trey Sailors, [place] [id number]  DOA: 01/15/2021  LOS: 38 days   Chief Complaint  Patient presents with  . Abscess   Brief narrative: Rebekah Burton is a 45 y.o. female with PMH significant for ESRD on HD MWF, blindness, hypertension, PAD. Patient presented to the ED on 01/15/2021 with breast abscess, underwent I&D by general surgery.  She was initially treated with IV antibiotics later completed a course with oral antibiotics.  She was also found to be incidental Covid positive.  Now she is off contact precautions.  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  IR and vascular were consulted.  aVF is now working. Dialysis per nephrology. Patient is homeless rhythm with her disposition is difficult.  Subjective: Patient was seen and examined this morning on dialysis. Not in distress. Lying down on bed. Pending placement  Assessment/Plan: Breast abscess -s/p incision drainage done by general surgery. She was started on Unasyn, pulmonary cultures grew gram-positive cocci.  She was transitioned to Augmentin and has completed the course.  Incidental COVID-19 -patient is asymptomatic, no evidence of pulmonary involvement.  -Off isolation. Currently on room air.  ESRD -nephrology was consulted, had issues with cannulation of aVF. 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  AVF is now working.  Dialysis per nephrology.  Continue sevelamer.  Anemia of chronic disease -monitor intermittently, hemoglobin is stable at 9.1.  Continue Aranesp. Recent Labs    01/16/21 0512 01/16/21 0821 02/03/21 1244 02/05/21 0641 02/16/21 1204 02/16/21 1813 02/17/21 1312 02/19/21 0836 02/22/21 0852  HGB  --    < > 8.4*   < > 9.3* 9.7* 9.2* 9.4* 9.3*  MCV  --    < > 97.0   < > 96.8 97.1 99.3 98.7 98.7   FERRITIN 1,976*  --   --   --   --   --   --   --   --   TIBC  --   --  259  --   --   --   --   --   --   IRON  --   --  88  --   --   --   --   --   --    < > = values in this interval not displayed.   Hyponatremia -patient on hemodialysis, nephrology following. Recent Labs  Lab 02/16/21 1204 02/17/21 1312 02/19/21 0837 02/22/21 0852  NA 132* 134* 135 133*   Hypertension -Blood pressure controlled on clonidine and Coreg.  Peripheral artery disease -hx of Fem-pop bypass. -Has history of left transmetatarsal amputation.  Mobility: Encourage ambulation but she has transmetatarsal amputation Code Status:   Code Status: Full Code  Nutritional status: Body mass index is 38.6 kg/m.     Diet Order            Diet renal with fluid restriction Fluid restriction: 1200 mL Fluid; Room service appropriate? Yes; Fluid consistency: Thin  Diet effective now                 DVT prophylaxis: heparin injection 5,000 Units Start: 01/26/21 0600   Antimicrobials:  None Fluid: None Consultants: Nephrology Family Communication:  None at bedside  Status is: Inpatient Dispo: The patient is: Homeless              Anticipated  d/c is to: SNF              Patient currently is medically stable to d/c.   Difficult to place patient Yes  Infusions:  . sodium chloride      Scheduled Meds: . carvedilol  25 mg Oral BID WC  . Chlorhexidine Gluconate Cloth  6 each Topical Q0600  . cloNIDine  0.3 mg Oral TID  . darbepoetin (ARANESP) injection - DIALYSIS  150 mcg Intravenous Q Mon-HD  . heparin  5,000 Units Subcutaneous Q8H  . multivitamin  1 tablet Oral QHS  . senna  1 tablet Oral BID  . sevelamer carbonate  3,200 mg Oral TID WC    Antimicrobials: Anti-infectives (From admission, onward)   Start     Dose/Rate Route Frequency Ordered Stop   01/23/21 1400  amoxicillin-clavulanate (AUGMENTIN) 500-125 MG per tablet 500 mg        500 mg Oral Every 24 hours 01/23/21 1124 01/25/21 1657    01/23/21 1015  amoxicillin-clavulanate (AUGMENTIN) 875-125 MG per tablet 1 tablet  Status:  Discontinued        1 tablet Oral Every 12 hours 01/23/21 1002 01/23/21 1124   01/20/21 0845  vancomycin (VANCOCIN) 1-5 GM/200ML-% IVPB       Note to Pharmacy: Judieth Keens  : cabinet override      01/20/21 0845 01/20/21 2059   01/18/21 1200  vancomycin (VANCOCIN) IVPB 1000 mg/200 mL premix  Status:  Discontinued        1,000 mg 200 mL/hr over 60 Minutes Intravenous Every M-W-F (Hemodialysis) 01/16/21 1017 01/21/21 1324   01/17/21 2200  Ampicillin-Sulbactam (UNASYN) 3 g in sodium chloride 0.9 % 100 mL IVPB  Status:  Discontinued        3 g 200 mL/hr over 30 Minutes Intravenous Daily at bedtime 01/16/21 1058 01/23/21 1002   01/17/21 1000  remdesivir 100 mg in sodium chloride 0.9 % 100 mL IVPB       "Followed by" Linked Group Details   100 mg 200 mL/hr over 30 Minutes Intravenous Daily 01/15/21 2357 01/18/21 0918   01/16/21 1145  Ampicillin-Sulbactam (UNASYN) 3 g in sodium chloride 0.9 % 100 mL IVPB        3 g 200 mL/hr over 30 Minutes Intravenous  Once 01/16/21 1058 01/16/21 1417   01/16/21 1100  vancomycin (VANCOREADY) IVPB 1500 mg/300 mL        1,500 mg 150 mL/hr over 120 Minutes Intravenous  Once 01/16/21 1017 01/16/21 1849   01/16/21 0300  remdesivir 200 mg in sodium chloride 0.9% 250 mL IVPB       "Followed by" Linked Group Details   200 mg 580 mL/hr over 30 Minutes Intravenous Once 01/15/21 2357 01/16/21 0543   01/15/21 2345  amoxicillin-clavulanate (AUGMENTIN) 875-125 MG per tablet 1 tablet  Status:  Discontinued        1 tablet Oral Every 12 hours 01/15/21 2334 01/15/21 2357   01/15/21 1800  vancomycin (VANCOCIN) IVPB 1000 mg/200 mL premix        1,000 mg 200 mL/hr over 60 Minutes Intravenous  Once 01/15/21 1753 01/15/21 2120      PRN meds: sodium chloride, acetaminophen **OR** [DISCONTINUED] acetaminophen, HYDROcodone-acetaminophen   Objective: Vitals:   02/22/21 1130  02/22/21 1145  BP: (!) 111/56 (!) 122/51  Pulse: 77 (!) 59  Resp:  18  Temp:  (!) 97.5 F (36.4 C)  SpO2:  100%    Intake/Output Summary (Last 24 hours) at 02/22/2021 1259  Last data filed at 02/22/2021 1145 Gross per 24 hour  Intake 720 ml  Output 3000 ml  Net -2280 ml   Filed Weights   02/19/21 2036 02/22/21 0820 02/22/21 1145  Weight: 101 kg 105 kg 102 kg   Weight change:  Body mass index is 38.6 kg/m.   Physical Exam: General exam: Pleasant young African-American female, obese, not in distress. Skin: No rashes, lesions or ulcers. HEENT: Atraumatic, normocephalic, no obvious bleeding Lungs: Clear to auscultation bilaterally CVS: Regular rate and rhythm, no murmur GI/Abd soft, nontender, nondistended, bowel sound present CNS: Alert, awake, oriented x3 Psychiatry: Depressed look Extremities: No pedal edema, no calf tenderness  Data Review: I have personally reviewed the laboratory data and studies available.  Recent Labs  Lab 02/16/21 1204 02/16/21 1813 02/17/21 1312 02/19/21 0836 02/22/21 0852  WBC 7.9 7.5 7.3 7.9 9.3  HGB 9.3* 9.7* 9.2* 9.4* 9.3*  HCT 29.8* 30.1* 29.6* 30.9* 30.6*  MCV 96.8 97.1 99.3 98.7 98.7  PLT 205 188 175 200 231   Recent Labs  Lab 02/16/21 1204 02/17/21 1312 02/19/21 0837 02/22/21 0852  NA 132* 134* 135 133*  K 4.2 3.8 3.7 4.5  CL 91* 96* 94* 94*  CO2 '25 24 25 24  '$ GLUCOSE 128* 144* 121* 106*  BUN 76* 44* 46* 67*  CREATININE 16.11* 11.13* 11.68* 13.61*  CALCIUM 10.3 10.1 10.7* 10.7*  PHOS 7.0* 5.4* 5.0* 5.3*    F/u labs  Unresulted Labs (From admission, onward)         None      Signed, Terrilee Croak, MD [facility] 02/22/2021

## 2021-02-23 ENCOUNTER — Inpatient Hospital Stay (HOSPITAL_COMMUNITY): Payer: Medicare HMO

## 2021-02-23 DIAGNOSIS — N611 Abscess of the breast and nipple: Secondary | ICD-10-CM | POA: Diagnosis not present

## 2021-02-23 DIAGNOSIS — N186 End stage renal disease: Secondary | ICD-10-CM | POA: Diagnosis not present

## 2021-02-23 DIAGNOSIS — U071 COVID-19: Secondary | ICD-10-CM | POA: Diagnosis not present

## 2021-02-23 LAB — CBC WITH DIFFERENTIAL/PLATELET
Abs Immature Granulocytes: 0.04 10*3/uL (ref 0.00–0.07)
Basophils Absolute: 0 10*3/uL (ref 0.0–0.1)
Basophils Relative: 1 %
Eosinophils Absolute: 0.3 10*3/uL (ref 0.0–0.5)
Eosinophils Relative: 4 %
HCT: 32.2 % — ABNORMAL LOW (ref 36.0–46.0)
Hemoglobin: 9.8 g/dL — ABNORMAL LOW (ref 12.0–15.0)
Immature Granulocytes: 1 %
Lymphocytes Relative: 10 %
Lymphs Abs: 0.7 10*3/uL (ref 0.7–4.0)
MCH: 30.2 pg (ref 26.0–34.0)
MCHC: 30.4 g/dL (ref 30.0–36.0)
MCV: 99.4 fL (ref 80.0–100.0)
Monocytes Absolute: 0.5 10*3/uL (ref 0.1–1.0)
Monocytes Relative: 6 %
Neutro Abs: 5.8 10*3/uL (ref 1.7–7.7)
Neutrophils Relative %: 78 %
Platelets: 247 10*3/uL (ref 150–400)
RBC: 3.24 MIL/uL — ABNORMAL LOW (ref 3.87–5.11)
RDW: 18.1 % — ABNORMAL HIGH (ref 11.5–15.5)
WBC: 7.3 10*3/uL (ref 4.0–10.5)
nRBC: 0 % (ref 0.0–0.2)

## 2021-02-23 MED ORDER — AMOXICILLIN-POT CLAVULANATE 875-125 MG PO TABS: 1.0000 | ORAL_TABLET | Freq: Two times a day (BID) | ORAL | Status: DC

## 2021-02-23 MED ORDER — HEPARIN SODIUM (PORCINE) 1000 UNIT/ML DIALYSIS: 20.0000 [IU]/kg | INTRAMUSCULAR | Status: DC | PRN

## 2021-02-23 MED ORDER — PENTAFLUOROPROP-TETRAFLUOROETH EX AERO: 1.0000 "application " | INHALATION_SPRAY | CUTANEOUS | Status: DC | PRN

## 2021-02-23 MED ORDER — HEPARIN SODIUM (PORCINE) 1000 UNIT/ML DIALYSIS: 1000.0000 [IU] | INTRAMUSCULAR | Status: DC | PRN

## 2021-02-23 MED ORDER — ALTEPLASE 2 MG IJ SOLR: 2.0000 mg | Freq: Once | INTRAMUSCULAR | Status: DC | PRN

## 2021-02-23 MED ORDER — LIDOCAINE HCL (PF) 1 % IJ SOLN: 5.0000 mL | INTRAMUSCULAR | Status: DC | PRN

## 2021-02-23 MED ORDER — ACETAMINOPHEN 325 MG PO TABS
325.0000 mg | ORAL_TABLET | ORAL | Status: DC | PRN
  Administered 2021-03-01 – 2021-03-10 (×8): 325 mg via ORAL
  Filled 2021-02-23 (×8): qty 1

## 2021-02-23 MED ORDER — AMOXICILLIN-POT CLAVULANATE 500-125 MG PO TABS: 1.0000 | ORAL_TABLET | Freq: Every day | ORAL | Status: DC

## 2021-02-23 MED ORDER — LIDOCAINE-PRILOCAINE 2.5-2.5 % EX CREA: 1.0000 "application " | TOPICAL_CREAM | CUTANEOUS | Status: DC | PRN

## 2021-02-23 MED ORDER — SODIUM CHLORIDE 0.9 % IV SOLN: 100.0000 mL | INTRAVENOUS | Status: DC | PRN

## 2021-02-23 MED ORDER — OXYCODONE HCL 5 MG PO TABS
15.0000 mg | ORAL_TABLET | ORAL | Status: DC | PRN
  Administered 2021-02-23 – 2021-03-12 (×59): 15 mg via ORAL
  Filled 2021-02-23 (×60): qty 3

## 2021-02-23 MED ORDER — AMOXICILLIN-POT CLAVULANATE 500-125 MG PO TABS
1.0000 | ORAL_TABLET | Freq: Once | ORAL | Status: AC
  Administered 2021-02-23: 500 mg via ORAL
  Filled 2021-02-23: qty 1

## 2021-02-23 NOTE — Progress Notes (Signed)
Called by Radiology that breast u/s ordered by surgery shows complex fluid collection in breast and needs I&D. Asked radiology to notify surgery, who is consulted and has seen patient, of the results of the u/s.

## 2021-02-23 NOTE — TOC Initial Note (Addendum)
Transition of Care [facility]) - Initial/Assessment Note    Patient Details  Name: Rebekah Burton MRN: [id number] Date of Birth: [birth year]  [facility] of [facility]) CM/SW Contact:    Archie Endo, LCSW Phone Number: 02/23/2021, 10:34 AM  Clinical Narrative:                 CSW and RN CM spoke with patient at bedside to complete discussion. Patient pleasant and receptive to [facility] involvement. Patient reports she was not previously homeless but that she was residing with a friend in [place]. Patient reports that her friend has relocated to [place] but will return on 03/05/21 to bring her personal documents (birth certificate, financial information, etc). Patient reports that she is agreeable to placement in a facility but strongly prefers to discharge to a hotel on 03/05/21 once her friend brings her money and documents.  Patient is agreeable for CSW to speak with Housing Authority to determine where she is at on the waiting list. Patient states she is not set up with services for her blindness but is agreeable for CSW to contact agencies on her behalf.  CSW attempted to reach Julianne Rice, Education officer, [employer] [facility] [employer] at [employer] without success - a voicemail and e-mail was sent requesting follow up.  CSW attempted to reach [place] - no answer, a voicemail was left requesting a return call.  Expected Discharge Plan: Home/Self Care Barriers to Discharge: Continued Medical Work up   Patient Goals and CMS Choice Patient states their goals for this hospitalization and ongoing recovery are:: Get independence back CMS Medicare.gov Compare Post Acute Care list provided to:: Patient Choice offered to / list presented to : Patient  Expected Discharge Plan and Services Expected Discharge Plan: Home/Self Care In-house Referral: Clinical Social Work Discharge Planning Services: CM Consult Post Acute Care Choice: Dialysis Living arrangements for the past 2 months: Single  Family Home                 DME Arranged: N/A DME Agency: NA       HH Arranged: RN [place] Agency: [facility] Date HH Agency Contacted: 01/21/21 Time [place]: [id number] Representative spoke with at [place]: Danae Chen  Prior Living Arrangements/Services Living arrangements for the past 2 months: Single Family Home Lives with:: Roommate,Self (Previously resided with friend) Patient language and need for interpreter reviewed:: Yes Do you feel safe going back to the place where you live?: Yes      Need for Family Participation in Patient Care: No (Comment) Care giver support system in place?: No (comment)   Criminal Activity/Legal Involvement Pertinent to Current Situation/Hospitalization: No - Comment as needed  Activities of Daily Living Home Assistive Devices/Equipment: None ADL Screening (condition at time of admission) Patient's cognitive ability adequate to safely complete daily activities?: Yes Is the patient deaf or have difficulty hearing?: No Does the patient have difficulty seeing, even when wearing glasses/contacts?: Yes Does the patient have difficulty concentrating, remembering, or making decisions?: No Patient able to express need for assistance with ADLs?: Yes Does the patient have difficulty dressing or bathing?: Yes Independently performs ADLs?: Yes (appropriate for developmental age) Does the patient have difficulty walking or climbing stairs?: No Weakness of Legs: None Weakness of Arms/Hands: None  Permission Sought/Granted Permission sought to share information with : Family Supports Permission granted to share information with : Yes, Verbal Permission Granted  Share Information with NAME: Morene Antu     Permission granted to  share info w Relationship: significant other  Permission granted to share info w Contact Information: [phone]  Emotional Assessment Appearance:: Appears stated age Attitude/Demeanor/Rapport:  Engaged,Charismatic,Gracious Affect (typically observed): Accepting,Adaptable,Calm,Happy,Hopeful Orientation: : Oriented to Self,Oriented to Place,Oriented to  Time,Oriented to Situation Alcohol / Substance Use: Not Applicable Psych Involvement: No (comment)  Admission diagnosis:  Breast abscess [N61.1] Breast abscess of female [N61.1] Patient Active Problem List   Diagnosis Date Noted  . Breast abscess 01/15/2021  . COVID-19 virus infection 01/15/2021  . Breast abscess of female 01/15/2021  . Cellulitis of left lower extremity 02/05/2019  . Post-operative pain   . Acute blood loss anemia   . S/P transmetatarsal amputation of foot, left ([place])   . Ischemic ulcer of left foot ([place]) 01/16/2019  . ESRD (end stage renal disease) ([place]) 01/16/2019  . HTN (hypertension) 01/16/2019  . 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  . PAD (peripheral artery disease) ([place]) 01/16/2019   PCP:  Trey Sailors, PA Pharmacy:   [facility] [place] [address] Phone: [phone] Fax: [phone]     Social Determinants of Health (SDOH) Interventions    Readmission Risk Interventions No flowsheet data found.

## 2021-02-23 NOTE — Progress Notes (Signed)
PHARMACY NOTE:  ANTIMICROBIAL RENAL DOSAGE ADJUSTMENT  Current antimicrobial regimen includes a mismatch between antimicrobial dosage and estimated renal function.  As per policy approved by the Pharmacy & Therapeutics and Medical Executive Committees, the antimicrobial dosage will be adjusted accordingly.  Current antimicrobial dosage:  Augmentin 875 mg bid  Indication: L-breast abscess  Renal Function:  Estimated Creatinine Clearance: 6.3 mL/min (A) (by C-G formula based on SCr of 13.61 mg/dL (H)). '[x]'$      On intermittent HD, scheduled: '[]'$      On CRRT    Antimicrobial dosage has been changed to:  Augmentin 500 mg daily   Additional comments:   Thank you for allowing pharmacy to be a part of this patient's care.  Alycia Rossetti, PharmD, BCPS Clinical Pharmacist Clinical phone for 02/23/2021: [phone] 02/23/2021 11:49 AM   **Pharmacist phone directory can now be found on [email or web address] (PW [email or web address]).  Listed under [place].

## 2021-02-23 NOTE — TOC Progression Note (Signed)
Transition of Care [facility] - Progression Note    Patient Details  Name: Rebekah Burton MRN: [id number] Date of Birth: May 22, 1976  Transition [facility]) CM/SW Contact  Curlene Labrum, RN Phone Number: 02/23/2021, 12:31 PM  Clinical Narrative:    Case management met with the patient at the bedside regarding transitions of care.  The patient was alert, oriented x 4 and pleasant at time of admission.  She states that she lived with her friend in a home prior to admission to the hospital but is unable to return to the home upon discharge from the hospital since her friend is moving to [place] and patient does not have keys to the home nor her belongings at this time.  The patient is blind and does not use dme for ambulation prior to hospitalization even though she has history of bilateral transmetatarsal amputations.  The patient would like to discharge to a hotel in [place], [place] after discharge from the hospital.    The patient currently has Left breast pain and surgery MD/PA has seen her regarding this matter and plans to have a breast ultrasound to R/O abscess and/or malignancy and is pending test and or results at this time.  Madilyn Fireman, MSW saw the patient at the bedside as well - see note.  The patient does not have any home health needs at this time but if this changes than patient is agreeable to services if needed.  CM and MSW with DTP team will continue to see the patient for transitions of care needs.  Expected Discharge Plan: Home/Self Care Barriers to Discharge: Continued Medical Work up  Expected Discharge Plan and Services Expected Discharge Plan: Home/Self Care In-house Referral: Clinical Social Work Discharge Planning Services: CM Consult Post Acute Care Choice: Dialysis Living arrangements for the past 2 months: Single Family Home                 DME Arranged: N/A DME Agency: NA       HH Arranged: RN [place] Agency: [facility] Date HH Agency  Contacted: 01/21/21 Time [place]: [id number] Representative spoke with at [place]: [place] ([place]) Interventions    Readmission Risk Interventions No flowsheet data found.

## 2021-02-23 NOTE — Progress Notes (Addendum)
Rebekah Burton Progress Note   Subjective:     Patient was seen and examined today at bedside. She has no compliants at this time. Denies CP, SOB, ABD pain, N/V/D. She is awiting placement.  Objective Vitals:   02/22/21 1818 02/22/21 2220 02/23/21 0500 02/23/21 0955  BP: 120/73 114/61 (!) 141/88 (!) 115/59  Pulse: 79 78 79 76  Resp: '16 17  17  '$ Temp: 97.7 F (36.5 C) 97.9 F (36.6 C) 98.1 F (36.7 C) 98.2 F (36.8 C)  TempSrc:  Oral Oral   SpO2: 100% 100% 97% 100%  Weight:      Height:       Physical Exam General:Appears comfortable; no acute respiratory distress Heart: Normal S1 and S2; No murmurs, gallops, or friction rub Lungs:Clear throughout w/o wheezing, rales, or rhonchi Abdomen:soft, round non-tender Extremities:No LE edema Dialysis Access:L AVF (+) bruit/thrill  Filed Weights   02/19/21 2036 02/22/21 0820 02/22/21 1145  Weight: 101 kg 105 kg 102 kg    Intake/Output Summary (Last 24 hours) at 02/23/2021 1217 Last data filed at 02/23/2021 0900 Gross per 24 hour  Intake 597 ml  Output --  Net 597 ml    Additional Objective Labs: Basic Metabolic Panel: Recent Labs  Lab 02/17/21 1312 02/19/21 0837 02/22/21 0852  NA 134* 135 133*  K 3.8 3.7 4.5  CL 96* 94* 94*  CO2 '24 25 24  '$ GLUCOSE 144* 121* 106*  BUN 44* 46* 67*  CREATININE 11.13* 11.68* 13.61*  CALCIUM 10.1 10.7* 10.7*  PHOS 5.4* 5.0* 5.3*   Liver Function Tests: Recent Labs  Lab 02/17/21 1312 02/19/21 0837 02/22/21 0852  ALBUMIN 3.2* 3.2* 3.0*   No results for input(s): LIPASE, AMYLASE in the last 168 hours. CBC: Recent Labs  Lab 02/16/21 1813 02/17/21 1312 02/19/21 0836 02/22/21 0852 02/23/21 1028  WBC 7.5 7.3 7.9 9.3 7.3  NEUTROABS  --   --   --   --  5.8  HGB 9.7* 9.2* 9.4* 9.3* 9.8*  HCT 30.1* 29.6* 30.9* 30.6* 32.2*  MCV 97.1 99.3 98.7 98.7 99.4  PLT 188 175 200 231 247   Blood Culture    Component Value Date/Time   SDES ABSCESS LEFT BREAST 01/16/2021  0009   SPECREQUEST NONE 01/16/2021 0009   CULT  01/16/2021 0009    MIXED ANAEROBIC FLORA PRESENT.  CALL LAB IF FURTHER IID REQUIRED.   REPTSTATUS 01/21/2021 FINAL 01/16/2021 0009    Medications: . sodium chloride     . amoxicillin-clavulanate  1 tablet Oral Once  . [START ON 02/24/2021] amoxicillin-clavulanate  1 tablet Oral QHS  . carvedilol  25 mg Oral BID WC  . Chlorhexidine Gluconate Cloth  6 each Topical Q0600  . cloNIDine  0.3 mg Oral TID  . darbepoetin (ARANESP) injection - DIALYSIS  150 mcg Intravenous Q Mon-HD  . heparin  5,000 Units Subcutaneous Q8H  . multivitamin  1 tablet Oral QHS  . senna  1 tablet Oral BID  . sevelamer carbonate  3,200 mg Oral TID WC    Dialysis Orders: [place] 4h 29mn 2/2.5 bath 105kg AVF Heparin ? parsabiv 15 mg three times a week with HD mircera 150 mcg every 2 weeks - last given 01/13/21 hectorol 3 mcg three times a week  Assessment/Plan: 1. ESRD: Continue HD per MWF schedule, scheduled for HD tomorrow 02/24/21 2. HD vascular access: Recurrentcannulationissuesw/AVF.IR completed f'gram 01/25/21 showing significant pseudoaneurysm s/p thrombin injection and near complete thrombosis of pseudoaneurysm per IR.Cannulation issues now appear to  have resolved, will continue to monitor. 3. Left breast abscess - s/p I&D, completed course of Augmentin. 4. Covid-19: Incidentally found, asymptomatic. Out of isolation window now. 5. HTN: BP controlled, edema resolved - now below EDW, will need to be lowered at d/c. 6. Anemiaof ESRD:Hgb9.8.ContinueAranesp 169mg weekly. Tsat34%, ferritin [id number] 7. Metabolic bone disease- Phos improving - now at goal, continue binders- renvela.Corrected calcium high, hectorol on hold, use low Ca bath. Not able to get parsabiv here.  8. Social Issues:Difficult situation- pt homeless. Per Hospitalist note, anticipated d/c to SNF. SW involved.  CTobie Poet NP [facility]  [place] 02/23/2021,12:17 PM  LOS: 39 days    Patient seen and examined, agree with above note with above modifications. No new issues-  Tolerated HD with 3 liters removed yesterday.  Plan for routine HD tomorrow- appropriate adjustment of HD related meds  KCorliss Parish MD 02/23/2021

## 2021-02-23 NOTE — Progress Notes (Signed)
[facility] PROGRESS NOTE  Sharyn Shay [id number] DOB: [birth year] DOA: 01/15/2021 PCP: Trey Sailors, PA  Status:  Remains inpatient appropriate because:Unsafe d/c plan and Inpatient level of care appropriate due to severity of illness   Dispo: The patient is from: Homeless              Anticipated d/c is to: ALF vs SNF vs to be determined on 3/1 patient told [facility] staff that she did not wish to pursue skilled nursing facility and instead reported desire to be set up in a hotel.  Medicaid check not available until 3/16.  If medically stable before that date we will ask [facility] supervisor to assist with payment for motel room.              Patient currently is not medically stable to d/c.  Patient has had reemergence of left breast abscess and will be requiring close follow-up as well as oral antibiotics.  Will be undergoing an ultrasound-guided aspiration of the abscess in the left breast.   Difficult to place patient Yes   Level of care: Med-Surg  Code Status: Full Family Communication:  DVT prophylaxis: SQ Heparin Vaccination status: Unknown-Incidentally Covid positive during this admission (01/15/2021)  Foley catheter: No  HPI:  45 y.o. female with PMH significant for ESRD on HD MWF, blindness, hypertension, PAD. Patient presented to the ED on 01/15/2021 with breast abscess, underwent I&D by general surgery.  She was initially treated with IV antibiotics later completed a course with oral antibiotics.  She was also found to be incidental Covid positive. Now she is off contact precautions. 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 IR and vascular were consulted. aVF is now working.Dialysis per nephrology.   Subjective: Patient awake alert and pleasant.  Complaining of significant pain in left breast/site of previous abscess.  Objective: Vitals:   02/22/21 2220 02/23/21  0500  BP: 114/61 (!) 141/88  Pulse: 78 79  Resp: 17   Temp: 97.9 F (36.6 C) 98.1 F (36.7 C)  SpO2: 100% 97%    Intake/Output Summary (Last 24 hours) at 02/23/2021 0800 Last data filed at 02/22/2021 2201 Gross per 24 hour  Intake 357 ml  Output 3000 ml  Net -2643 ml   Filed Weights   02/19/21 2036 02/22/21 0820 02/22/21 1145  Weight: 101 kg 105 kg 102 kg    Exam:  Constitutional: NAD, calm, comfortable Breast: Left breast swollen, indurated/reddened with peau d'orange skin change.  Dressing over previous abscess site and dressing not removed.  Indurated area quite tender to minimal palpation. Respiratory: clear to auscultation bilaterally, Normal respiratory effort. No accessory muscle use.  Stable on room air Cardiovascular: Regular rate and rhythm, no murmurs / rubs / gallops. No extremity edema.  Abdomen: no tenderness, no masses palpated. No hepatosplenomegaly. Bowel sounds positive.  Neurologic: CN 2-12 grossly intact except for known blindness/visual impairment. Sensation intact, DTR normal. Strength 5/5 x all 4 extremities.  Psychiatric: Normal judgment and insight. Alert and oriented x 3. Normal mood.    Assessment/Plan: Acute problems: Breast abscess/recurrent -s/p incision drainage done by general surgery.-Pathology without any evidence of malignancy -She was started on Unasyn, cultures grew gram-positive cocci. She was transitioned to Augmentin and has completed the course. -On 3/1 patient as well as bedside nurse reported increasing pain and redness to left breast.  Exam consistent with reemergent abscess.  General surgery consulted -Ultrasound of breast  to include ultrasound-guided aspiration planned -3/1 was resumed on Augmentin -Patient with increasing pain so pain medications changed from Vicodin 5-325 1-2 every 4 hours as needed to OxyIR 15 mg every 4 hours as needed plus Tylenol 325 mg every 4 hours as needed  Incidental COVID-19 -patient is asymptomatic,  no evidence of pulmonary involvement.  -Off isolation.Currently on room air.  ESRD -nephrology was consulted, had issues with cannulation of aVF. IR and vascular was consulted and underwent fistulogram demonstrated pseudoaneurysm.  -AVF is now working.  -Dialysis per nephrology.  -Continue sevelamer.  Anemia of chronic disease -monitor intermittently, hemoglobin is stable at 9.1. Continue Aranesp.   Other problems:  Hyponatremia -patient on hemodialysis, nephrology following. Last Labs         Recent Labs  Lab 02/16/21 1204 02/17/21 1312 02/19/21 0837 02/22/21 0852  NA 132* 134* 135 133*     Hypertension -Blood pressure controlled on clonidine and Coreg.  Peripheral artery disease -hx of Fem-pop bypass. -Has history of left transmetatarsal amputation.    Data Reviewed: Basic Metabolic Panel: Recent Labs  Lab 02/16/21 1204 02/17/21 1312 02/19/21 0837 02/22/21 0852  NA 132* 134* 135 133*  K 4.2 3.8 3.7 4.5  CL 91* 96* 94* 94*  CO2 '25 24 25 24  '$ GLUCOSE 128* 144* 121* 106*  BUN 76* 44* 46* 67*  CREATININE 16.11* 11.13* 11.68* 13.61*  CALCIUM 10.3 10.1 10.7* 10.7*  PHOS 7.0* 5.4* 5.0* 5.3*   Liver Function Tests: Recent Labs  Lab 02/16/21 1204 02/17/21 1312 02/19/21 0837 02/22/21 0852  ALBUMIN 3.3* 3.2* 3.2* 3.0*   No results for input(s): LIPASE, AMYLASE in the last 168 hours. No results for input(s): AMMONIA in the last 168 hours. CBC: Recent Labs  Lab 02/16/21 1204 02/16/21 1813 02/17/21 1312 02/19/21 0836 02/22/21 0852  WBC 7.9 7.5 7.3 7.9 9.3  HGB 9.3* 9.7* 9.2* 9.4* 9.3*  HCT 29.8* 30.1* 29.6* 30.9* 30.6*  MCV 96.8 97.1 99.3 98.7 98.7  PLT 205 188 175 200 231   Cardiac Enzymes: No results for input(s): CKTOTAL, CKMB, CKMBINDEX, TROPONINI in the last 168 hours. BNP (last 3 results) Recent Labs    01/18/21 0136 01/19/21 0030 01/20/21 0020  BNP 1,410.7* 1,122.4* 1,383.7*    ProBNP (last 3 results) No results for  input(s): PROBNP in the last 8760 hours.  CBG: No results for input(s): GLUCAP in the last 168 hours.  No results found for this or any previous visit (from the past 240 hour(s)).   Studies: No results found.  Scheduled Meds: . carvedilol  25 mg Oral BID WC  . Chlorhexidine Gluconate Cloth  6 each Topical Q0600  . cloNIDine  0.3 mg Oral TID  . darbepoetin (ARANESP) injection - DIALYSIS  150 mcg Intravenous Q Mon-HD  . heparin  5,000 Units Subcutaneous Q8H  . multivitamin  1 tablet Oral QHS  . senna  1 tablet Oral BID  . sevelamer carbonate  3,200 mg Oral TID WC   Continuous Infusions: . sodium chloride      Active Problems:   ESRD (end stage renal disease) ([place])   Breast abscess   COVID-19 virus infection   Breast abscess of female   Consultants:  Nephrology  General surgery  Procedures:  Image of breast abscess  Antibiotics: Anti-infectives (From admission, onward)   Start     Dose/Rate Route Frequency Ordered Stop   02/24/21 2200  amoxicillin-clavulanate (AUGMENTIN) 500-125 MG per tablet 500 mg        1  tablet Oral Daily at bedtime 02/23/21 1149     02/23/21 1245  amoxicillin-clavulanate (AUGMENTIN) 500-125 MG per tablet 500 mg        1 tablet Oral  Once 02/23/21 1149     02/23/21 1215  amoxicillin-clavulanate (AUGMENTIN) 875-125 MG per tablet 1 tablet  Status:  Discontinued        1 tablet Oral Every 12 hours 02/23/21 1120 02/23/21 1149   01/23/21 1400  amoxicillin-clavulanate (AUGMENTIN) 500-125 MG per tablet 500 mg        500 mg Oral Every 24 hours 01/23/21 1124 01/25/21 1657   01/23/21 1015  amoxicillin-clavulanate (AUGMENTIN) 875-125 MG per tablet 1 tablet  Status:  Discontinued        1 tablet Oral Every 12 hours 01/23/21 1002 01/23/21 1124   01/20/21 0845  vancomycin (VANCOCIN) 1-5 GM/200ML-% IVPB       Note to Pharmacy: Judieth Keens  : cabinet override      01/20/21 0845 01/20/21 2059   01/18/21 1200  vancomycin (VANCOCIN) IVPB 1000 mg/200 mL  premix  Status:  Discontinued        1,000 mg 200 mL/hr over 60 Minutes Intravenous Every M-W-F (Hemodialysis) 01/16/21 1017 01/21/21 1324   01/17/21 2200  Ampicillin-Sulbactam (UNASYN) 3 g in sodium chloride 0.9 % 100 mL IVPB  Status:  Discontinued        3 g 200 mL/hr over 30 Minutes Intravenous Daily at bedtime 01/16/21 1058 01/23/21 1002   01/17/21 1000  remdesivir 100 mg in sodium chloride 0.9 % 100 mL IVPB       "Followed by" Linked Group Details   100 mg 200 mL/hr over 30 Minutes Intravenous Daily 01/15/21 2357 01/18/21 0918   01/16/21 1145  Ampicillin-Sulbactam (UNASYN) 3 g in sodium chloride 0.9 % 100 mL IVPB        3 g 200 mL/hr over 30 Minutes Intravenous  Once 01/16/21 1058 01/16/21 1417   01/16/21 1100  vancomycin (VANCOREADY) IVPB 1500 mg/300 mL        1,500 mg 150 mL/hr over 120 Minutes Intravenous  Once 01/16/21 1017 01/16/21 1849   01/16/21 0300  remdesivir 200 mg in sodium chloride 0.9% 250 mL IVPB       "Followed by" Linked Group Details   200 mg 580 mL/hr over 30 Minutes Intravenous Once 01/15/21 2357 01/16/21 0543   01/15/21 2345  amoxicillin-clavulanate (AUGMENTIN) 875-125 MG per tablet 1 tablet  Status:  Discontinued        1 tablet Oral Every 12 hours 01/15/21 2334 01/15/21 2357   01/15/21 1800  vancomycin (VANCOCIN) IVPB 1000 mg/200 mL premix        1,000 mg 200 mL/hr over 60 Minutes Intravenous  Once 01/15/21 1753 01/15/21 2120        Time spent: 30 minutes    Erin Hearing ANP  [facility] 7 am - 330 pm/M-F for direct patient care and secure chat Please refer to Amion for contact info 39  days

## 2021-02-23 NOTE — Progress Notes (Signed)
Progress Note  39 Days Post-Op  Subjective: Patient underwent I&D of left breast abscess back in the end of January by Dr. Zenia Resides. Had been doing well with dressing changes and then last night noted increased pain in L breast. Previous I&D site at 5 o'clock and is no longer being packed as it has healed. No drainage from this site. Increased pain now at 10 o'clock position with firmness surrounding and fluctuant area that is about 3-4 cm in diameter. She denies fever or chills or drainage from nipple.   Objective: Vital signs in last 24 hours: Temp:  [97.5 F (36.4 C)-98.1 F (36.7 C)] 98.1 F (36.7 C) (03/01 0500) Pulse Rate:  [59-81] 79 (03/01 0500) Resp:  [16-18] 17 (02/28 2220) BP: (107-141)/(45-88) 141/88 (03/01 0500) SpO2:  [97 %-100 %] 97 % (03/01 0500) Weight:  [102 kg] 102 kg (02/28 1145) Last BM Date: 02/22/21  Intake/Output from previous day: 02/28 0701 - 03/01 0700 In: 357 [P.O.:357] Out: 3000  Intake/Output this shift: No intake/output data recorded.  PE: General: pleasant, WD, obese female who is sitting in bed in NAD Heart: regular, rate, and rhythm.  Lungs: Respiratory effort nonlabored Breast: prior I&D site inferolateral to nipple well healed without significant tenderness or drainage, superomedial to left nipple there is now a 3-4 cm fluctuant area with surrounding mild erythema and induration, no nipple drainage noted     Psych: A&Ox3 with an appropriate affect.    Lab Results:  Recent Labs    02/22/21 0852  WBC 9.3  HGB 9.3*  HCT 30.6*  PLT 231   BMET Recent Labs    02/22/21 0852  NA 133*  K 4.5  CL 94*  CO2 24  GLUCOSE 106*  BUN 67*  CREATININE 13.61*  CALCIUM 10.7*   PT/INR No results for input(s): LABPROT, INR in the last 72 hours. CMP     Component Value Date/Time   NA 133 (L) 02/22/2021 0852   K 4.5 02/22/2021 0852   CL 94 (L) 02/22/2021 0852   CO2 24 02/22/2021 0852   GLUCOSE 106 (H) 02/22/2021 0852   BUN 67 (H)  02/22/2021 0852   CREATININE 13.61 (H) 02/22/2021 0852   CALCIUM 10.7 (H) 02/22/2021 0852   PROT 6.0 (L) 01/23/2021 0426   ALBUMIN 3.0 (L) 02/22/2021 0852   AST 46 (H) 01/23/2021 0426   ALT 35 01/23/2021 0426   ALKPHOS 42 01/23/2021 0426   BILITOT 0.7 01/23/2021 0426   GFRNONAA 3 (L) 02/22/2021 0852   GFRAA 5 (L) 02/13/2019 0734   Lipase     Component Value Date/Time   LIPASE 33 07/17/2018 1145       Studies/Results: No results found.  Anti-infectives: Anti-infectives (From admission, onward)   Start     Dose/Rate Route Frequency Ordered Stop   01/23/21 1400  amoxicillin-clavulanate (AUGMENTIN) 500-125 MG per tablet 500 mg        500 mg Oral Every 24 hours 01/23/21 1124 01/25/21 1657   01/23/21 1015  amoxicillin-clavulanate (AUGMENTIN) 875-125 MG per tablet 1 tablet  Status:  Discontinued        1 tablet Oral Every 12 hours 01/23/21 1002 01/23/21 1124   01/20/21 0845  vancomycin (VANCOCIN) 1-5 GM/200ML-% IVPB       Note to Pharmacy: Judieth Keens  : cabinet override      01/20/21 0845 01/20/21 2059   01/18/21 1200  vancomycin (VANCOCIN) IVPB 1000 mg/200 mL premix  Status:  Discontinued  1,000 mg 200 mL/hr over 60 Minutes Intravenous Every M-W-F (Hemodialysis) 01/16/21 1017 01/21/21 1324   01/17/21 2200  Ampicillin-Sulbactam (UNASYN) 3 g in sodium chloride 0.9 % 100 mL IVPB  Status:  Discontinued        3 g 200 mL/hr over 30 Minutes Intravenous Daily at bedtime 01/16/21 1058 01/23/21 1002   01/17/21 1000  remdesivir 100 mg in sodium chloride 0.9 % 100 mL IVPB       "Followed by" Linked Group Details   100 mg 200 mL/hr over 30 Minutes Intravenous Daily 01/15/21 2357 01/18/21 0918   01/16/21 1145  Ampicillin-Sulbactam (UNASYN) 3 g in sodium chloride 0.9 % 100 mL IVPB        3 g 200 mL/hr over 30 Minutes Intravenous  Once 01/16/21 1058 01/16/21 1417   01/16/21 1100  vancomycin (VANCOREADY) IVPB 1500 mg/300 mL        1,500 mg 150 mL/hr over 120 Minutes  Intravenous  Once 01/16/21 1017 01/16/21 1849   01/16/21 0300  remdesivir 200 mg in sodium chloride 0.9% 250 mL IVPB       "Followed by" Linked Group Details   200 mg 580 mL/hr over 30 Minutes Intravenous Once 01/15/21 2357 01/16/21 0543   01/15/21 2345  amoxicillin-clavulanate (AUGMENTIN) 875-125 MG per tablet 1 tablet  Status:  Discontinued        1 tablet Oral Every 12 hours 01/15/21 2334 01/15/21 2357   01/15/21 1800  vancomycin (VANCOCIN) IVPB 1000 mg/200 mL premix        1,000 mg 200 mL/hr over 60 Minutes Intravenous  Once 01/15/21 1753 01/15/21 2120       Assessment/Plan ESRD PAD Blind Chronic anemia Obesity BMI 40.76 COVID+  Recurrent Left breast abscess S/pIncision and drainage of left breast abscess with excisional debridement1/21 Dr. Zenia Resides - path was negative for malignancy 1/21, although still some concern for possible inflammatory breast cancer  - appears to potentially have a new abscess in 10 o'clock position now - recommend IR consult for possible US guided aspiration and cxs - no leukocytosis or fever - no indication for emergent surgical intervention but we will follow for now.   ID -abx were narrowed to PO augmentin and pt completed course 1/31 FEN -NPO  VTE -SCDs, sq heparin  LOS: 67 days    Norm Parcel , [facility] 02/23/2021, 9:28 AM Please see Amion for pager number during day hours 7:00am-4:30pm

## 2021-02-23 NOTE — Progress Notes (Signed)
Physical Therapy Treatment Patient Details Name: Rebekah Burton MRN: [id number] DOB: 10/28/1976 Today's Date: 02/23/2021    History of Present Illness 45 y.o. female with medical history significant of ESRD-HD M-W-F, blindness, HTN, PAD had developed a breast abscess and was seen in ED 01/04/21 where U/S confirmed abscess. She was to have been seen as outpatient for aspiration/drainage but did not get to make that appt. Today in HD the breast abscess opened and drained spontaneously. Because of continued drainage she presents to [facility] for evaluation. Found to be COVID+ taken to OR for further exploration, culture, and excisional debridement 01/15/21    PT Comments    Pt received in bed, reporting increased pain from a new L breast abscess but still willing to participate in PT. Session continued to challenge single leg stance and gait. Completed step taps in alternating directions (forward, left, right) to address single leg stance and direction changing. Also completed forwards and backwards gait with head turns to address coordination. Increased sway with turning head to L. Able to maintain balance in tandem stance longer today (~20 sec) but still noticeable sway and unsteadiness. Pt left in bed with all needs met and call bell within reach.    Follow Up Recommendations  No PT follow up;Supervision for mobility/OOB     Equipment Recommendations  None recommended by PT    Recommendations for Other Services       Precautions / Restrictions Precautions Precautions: Other (comment) Precaution Comments: pt is blind, needs assist for navigation, L breast abscess Restrictions Weight Bearing Restrictions: No    Mobility  Bed Mobility Overal bed mobility: Modified Independent       Supine to sit: Modified independent (Device/Increase time)          Transfers Overall transfer level: Modified independent Equipment used: None Transfers: Stand Pivot Transfers Sit to Stand: Modified  independent (Device/Increase time)            Ambulation/Gait   Gait Distance (Feet): 350 Feet (300 total, 100 walking backwards with head turns (left, right, up, down), 100 walking forwards with continuous headturns to left, 100 normal)   Gait Pattern/deviations: Step-through pattern;Decreased step length - right;Decreased step length - left;Drifts right/left Gait velocity: decreased   General Gait Details: Cues for navigation, increased sway turning to L   Stairs             Wheelchair Mobility    Modified Rankin (Stroke Patients Only)       Balance Overall balance assessment: Needs assistance Sitting-balance support: No upper extremity supported;Feet supported Sitting balance-Leahy Scale: Normal     Standing balance support: During functional activity;No upper extremity supported Standing balance-Leahy Scale: Good Standing balance comment: Steady in static standing and foward walking                            Cognition Arousal/Alertness: Awake/alert Behavior During Therapy: WFL for tasks assessed/performed Overall Cognitive Status: Within Functional Limits for tasks assessed                                 General Comments: very pleasant and cooperative, willing to try new exercises, aware of deficits and abilities      Exercises Other Exercises Other Exercises: Step taps (forward/latera) 2x10 each leg (1 rail support for first set, no support for 2nd set) Other Exercises: Tandem stance x30sec each side (Increased sway (more  with R lateral step taps))    General Comments General comments (skin integrity, edema, etc.): Steady in double leg stance, increased sway with single leg stance      Pertinent Vitals/Pain Faces Pain Scale: Hurts even more Pain Location: L breast abscess Pain Descriptors / Indicators: Discomfort;Aching;Guarding Pain Intervention(s): Monitored during session    Home Living                       Prior Function            PT Goals (current goals can now be found in the care plan section)      Frequency    Min 2X/week      PT Plan Current plan remains appropriate    Co-evaluation              AM-PAC PT "6 Clicks" Mobility   Outcome Measure  Help needed turning from your back to your side while in a flat bed without using bedrails?: None Help needed moving from lying on your back to sitting on the side of a flat bed without using bedrails?: None Help needed moving to and from a bed to a chair (including a wheelchair)?: None Help needed standing up from a chair using your arms (e.g., wheelchair or bedside chair)?: None Help needed to walk in hospital room?: A Little Help needed climbing 3-5 steps with a railing? : A Little 6 Click Score: 22    End of Session Equipment Utilized During Treatment: Gait belt Activity Tolerance: Patient tolerated treatment well Patient left: with call bell/phone within reach;in bed (sitting at EOB per her request) Nurse Communication: Mobility status PT Visit Diagnosis: Other abnormalities of gait and mobility (R26.89);Pain Pain - part of body:  (L breast and UE)     Time:  -     Charges:                        Rosita Kea, SPT

## 2021-02-24 ENCOUNTER — Inpatient Hospital Stay (HOSPITAL_COMMUNITY): Payer: Medicare HMO

## 2021-02-24 DIAGNOSIS — N186 End stage renal disease: Secondary | ICD-10-CM | POA: Diagnosis not present

## 2021-02-24 DIAGNOSIS — N611 Abscess of the breast and nipple: Secondary | ICD-10-CM

## 2021-02-24 DIAGNOSIS — U071 COVID-19: Secondary | ICD-10-CM | POA: Diagnosis not present

## 2021-02-24 LAB — RENAL FUNCTION PANEL
Albumin: 3 g/dL — ABNORMAL LOW (ref 3.5–5.0)
Anion gap: 14 (ref 5–15)
BUN: 47 mg/dL — ABNORMAL HIGH (ref 6–20)
CO2: 23 mmol/L (ref 22–32)
Calcium: 9.8 mg/dL (ref 8.9–10.3)
Chloride: 100 mmol/L (ref 98–111)
Creatinine, Ser: 9.23 mg/dL — ABNORMAL HIGH (ref 0.44–1.00)
GFR, Estimated: 5 mL/min — ABNORMAL LOW (ref >60)
Glucose, Bld: 112 mg/dL — ABNORMAL HIGH (ref 70–99)
Phosphorus: 3.6 mg/dL (ref 2.5–4.6)
Potassium: 3.7 mmol/L (ref 3.5–5.1)
Sodium: 137 mmol/L (ref 135–145)

## 2021-02-24 LAB — CBC
HCT: 30.5 % — ABNORMAL LOW (ref 36.0–46.0)
Hemoglobin: 9.6 g/dL — ABNORMAL LOW (ref 12.0–15.0)
MCH: 31 pg (ref 26.0–34.0)
MCHC: 31.5 g/dL (ref 30.0–36.0)
MCV: 98.4 fL (ref 80.0–100.0)
Platelets: 231 10*3/uL (ref 150–400)
RBC: 3.1 MIL/uL — ABNORMAL LOW (ref 3.87–5.11)
RDW: 17.7 % — ABNORMAL HIGH (ref 11.5–15.5)
WBC: 5.6 10*3/uL (ref 4.0–10.5)
nRBC: 0 % (ref 0.0–0.2)

## 2021-02-24 LAB — HEPATITIS B SURFACE ANTIGEN: Hepatitis B Surface Ag: NONREACTIVE

## 2021-02-24 MED ORDER — MIDAZOLAM HCL 2 MG/2ML IJ SOLN
INTRAMUSCULAR | Status: AC
  Filled 2021-02-24: qty 2

## 2021-02-24 MED ORDER — HEPARIN SODIUM (PORCINE) 5000 UNIT/ML IJ SOLN
5000.0000 [IU] | Freq: Three times a day (TID) | INTRAMUSCULAR | Status: DC
  Administered 2021-02-25 – 2021-03-18 (×52): 5000 [IU] via SUBCUTANEOUS
  Filled 2021-02-24 (×50): qty 1

## 2021-02-24 MED ORDER — LIDOCAINE HCL (PF) 1 % IJ SOLN
INTRAMUSCULAR | Status: AC
  Filled 2021-02-24: qty 30

## 2021-02-24 MED ORDER — HEPARIN SODIUM (PORCINE) 1000 UNIT/ML IJ SOLN
INTRAMUSCULAR | Status: AC
  Administered 2021-02-24: 2000 [IU] via INTRAVENOUS_CENTRAL
  Filled 2021-02-24: qty 2

## 2021-02-24 MED ORDER — CLINDAMYCIN HCL 300 MG PO CAPS
300.0000 mg | ORAL_CAPSULE | Freq: Four times a day (QID) | ORAL | Status: DC
  Filled 2021-02-24: qty 1

## 2021-02-24 MED ORDER — CLINDAMYCIN PHOSPHATE 600 MG/50ML IV SOLN
600.0000 mg | Freq: Four times a day (QID) | INTRAVENOUS | Status: DC
  Administered 2021-02-24 – 2021-02-27 (×13): 600 mg via INTRAVENOUS
  Filled 2021-02-24 (×15): qty 50

## 2021-02-24 MED ORDER — FENTANYL CITRATE (PF) 100 MCG/2ML IJ SOLN
INTRAMUSCULAR | Status: AC | PRN
  Administered 2021-02-24 (×2): 50 ug via INTRAVENOUS

## 2021-02-24 MED ORDER — FENTANYL CITRATE (PF) 100 MCG/2ML IJ SOLN
INTRAMUSCULAR | Status: AC
  Filled 2021-02-24: qty 2

## 2021-02-24 MED ORDER — OXYCODONE HCL 5 MG PO TABS
ORAL_TABLET | ORAL | Status: AC
  Filled 2021-02-24: qty 3

## 2021-02-24 MED ORDER — MIDAZOLAM HCL 2 MG/2ML IJ SOLN
INTRAMUSCULAR | Status: AC | PRN
  Administered 2021-02-24 (×2): 1 mg via INTRAVENOUS

## 2021-02-24 NOTE — Sedation Documentation (Signed)
Patient is moaning in pain, grimacing

## 2021-02-24 NOTE — Sedation Documentation (Signed)
Patient is resting comfortably. 

## 2021-02-24 NOTE — Procedures (Signed)
Patient was seen on dialysis and the procedure was supervised.  BFR 400  Via AVF BP is  133/68.   Patient appears to be tolerating treatment well  Rebekah Burton 02/24/2021

## 2021-02-24 NOTE — Progress Notes (Signed)
Referring Physician(s): Allen,S  Supervising Physician: Sandi Mariscal  Patient Status:  [facility] - In-pt  Chief Complaint:  Left breast abscess/pain  Subjective: Pt familiar to IR service from left upper extremity fistulogram with thrombectomy-angioplasty/stenting in 2021 as well as balloon assisted percutaneous thrombin injection into pseudoaneurysm arising from the peripheral aspect of the stented cephalic vein on Q000111Q.  Past medical history significant for peripheral arterial disease, hypertension, end-stage renal disease, blindness, COVID-19 in January 2022 and I&D of the left breast abscess back in the end of January 2022 by Dr. Zenia Resides.  Cultures grew mixed anaerobic flora.  She now presents with recurrent left breast discomfort/edema.  Left breast ultrasound on 3/1 revealed breast edema with large complex fluid collection within the retroareolar, lower and outer left breast.  Request now received from surgical service for image guided aspiration of this left breast fluid collection.  Patient is currently afebrile, WBC normal, hemoglobin 9.6, platelets normal.  Past Medical History:  Diagnosis Date  . Blind   . ESRD (end stage renal disease) ([place])   . Foot ulceration ([place]) 12/2018  . Hypertension   . PAD (peripheral artery disease) ([place])    Past Surgical History:  Procedure Laterality Date  . ABDOMINAL AORTOGRAM W/LOWER EXTREMITY Bilateral 01/17/2019   Procedure: ABDOMINAL AORTOGRAM W/LOWER EXTREMITY;  Surgeon: Waynetta Sandy, MD;  Location: [facility];  Service: Cardiovascular;  Laterality: Bilateral;  . ABDOMINAL HYSTERECTOMY    . ENDARTERECTOMY FEMORAL Left 01/18/2019   Procedure: ENDARTERECTOMY FEMORAL with Perfundaplasty;  Surgeon: Rosetta Posner, MD;  Location: [facility];  Service: Vascular;  Laterality: Left;  . FEMORAL-POPLITEAL BYPASS GRAFT Left 01/18/2019   Procedure: Left  FEMORAL- to  Below the Knee POPLITEAL ARTERY bypass using reversed safenous vein.;   Surgeon: Rosetta Posner, MD;  Location: [facility];  Service: Vascular;  Laterality: Left;  . INCISION AND DRAINAGE ABSCESS Left 01/15/2021   Procedure: INCISION AND DRAINAGE BREAST ABSCESS;  Surgeon: Dwan Bolt, MD;  Location: [facility];  Service: General;  Laterality: Left;  . IR DIALY SHUNT INTRO [place] W/IMG LEFT Left 01/28/2021  . IR THROMBECTOMY AV FISTULA W/THROMBOLYSIS/PTA/STENT INC/SHUNT/IMG LT Left 10/23/2020  . IR US GUIDE BX ASP/DRAIN  01/28/2021  . IR US GUIDE VASC ACCESS LEFT  01/26/2021  . toe removal Right    All toes on right foot have been removed.   . 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 Authorizing Provider  aspirin 81 MG EC tablet Take 81 mg by mouth daily. 02/11/19  Yes [provider]  b complex-vitamin c-folic acid (NEPHRO-VITE) 0.8 MG TABS tablet Take 1 tablet by mouth every Monday, Wednesday, and Friday with hemodialysis. 11/20/18  Yes [provider]  carvedilol (COREG) 25 MG tablet Take 25 mg by mouth 2 (two) times daily. 01/04/19  Yes [provider]  cloNIDine (CATAPRES) 0.3 MG tablet Take 0.3 mg by mouth 3 (three) times daily. 01/01/21  Yes [provider]  lidocaine-prilocaine (EMLA) cream Apply 1 application topically every Monday, Wednesday, and Friday with hemodialysis.  11/20/18  Yes [provider]  RENVELA 800 MG tablet Take 3,200 mg by mouth 3 (three) times daily with meals. 11/20/18  Yes [provider]  tiZANidine (ZANAFLEX) 4 MG tablet Take  4 mg by mouth as needed for muscle spasms. 10/06/20  Yes [provider]  amoxicillin-clavulanate (AUGMENTIN) 875-125 MG tablet Take 1 tablet by mouth every 12 (twelve)  hours. Patient not taking: Reported on 01/16/2021 01/05/21   Suella Broad A, PA-C  oxyCODONE (OXY IR/ROXICODONE) 5 MG immediate release tablet Take 1 tablet (5 mg total) by mouth every 4 (four) hours as needed for moderate pain. Patient not taking: No sig reported 02/09/19   Elgergawy, Silver Huguenin, MD  traMADol (ULTRAM) 50 MG tablet Take 1 tablet (50 mg total) by mouth every 6 (six) hours as needed for moderate pain. Patient not taking: No sig reported 02/09/19   Elgergawy, Silver Huguenin, MD     Vital Signs: BP 113/83 (BP Location: Right Arm)   Pulse 79   Temp 98 F (36.7 C)   Resp 18   Ht '5\' 4"'$  (1.626 m)   Wt 213 lb 6.5 oz (96.8 kg)   SpO2 98%   BMI 36.63 kg/m   Physical Exam awake, alert.  Visually impaired.  Chest clear to auscultation bilaterally.  Heart with regular rate and rhythm.  Abdomen soft, positive bowel sounds, nontender.  Left upper arm AV fistula noted with positive thrill/bruit.  Patient now with noted firm/fluctuant area in the 10 o'clock position approximately 3 to 4 cm in diameter, tender to palpation.  Imaging: US BREAST LTD UNI LEFT INC AXILLA  Result Date: 02/23/2021 CLINICAL DATA:  Left breast swelling EXAM: ULTRASOUND OF THE left BREAST COMPARISON:  Ultrasound 01/05/2021, CT chest 01/05/2021 FINDINGS: Targeted ultrasound of the region of breast swelling is performed. Large complex fluid collection within the retroareolar left breast and extending to the 3 and 6 o'clock positions of the left breast 1-2 cm from nipple. Measured complex fluid collection at the 3 o'clock position 2 cm from nipple measures 6.2 x 3.9 cm. Deeper fluid collection at the 1 o'clock position measuring 5.1 x 2.4 cm. Generalized edema within the breast tissues. IMPRESSION: Breast edema with large complex fluid collection within the retroareolar, lower, and outer left breast. RECOMMENDATION: Recommend aspiration of complex fluid and or surgical consultation. Mammographic evaluation following resolution of  acute symptoms should also be considered. BI-RADS CATEGORY  4: Suspicious. These results will be called to the ordering clinician or representative by the Radiologist Assistant, and communication documented in the PACS or [employer]. Electronically Signed   By: Donavan Foil M.D.   On: 02/23/2021 18:20    Labs:  CBC: Recent Labs    02/19/21 0836 02/22/21 0852 02/23/21 1028 02/24/21 0742  WBC 7.9 9.3 7.3 5.6  HGB 9.4* 9.3* 9.8* 9.6*  HCT 30.9* 30.6* 32.2* 30.5*  PLT 200 231 247 231    COAGS: No results for input(s): INR, APTT in the last 8760 hours.  BMP: Recent Labs    02/17/21 1312 02/19/21 0837 02/22/21 0852 02/24/21 0742  NA 134* 135 133* 137  K 3.8 3.7 4.5 3.7  CL 96* 94* 94* 100  CO2 '24 25 24 23  '$ GLUCOSE 144* 121* 106* 112*  BUN 44* 46* 67* 47*  CALCIUM 10.1 10.7* 10.7* 9.8  CREATININE 11.13* 11.68* 13.61* 9.23*  GFRNONAA 4* 4* 3* 5*    LIVER FUNCTION TESTS: Recent Labs    01/18/21 0136 01/19/21 0038 01/20/21 0020 01/23/21 0426 01/26/21 1800 02/17/21 1312 02/19/21 0837 02/22/21 0852 02/24/21 0742  BILITOT 0.5 1.0 0.7 0.7  --   --   --   --   --   AST 11* 16  23 46*  --   --   --   --   --   ALT '7 11 15 '$ 35  --   --   --   --   --   ALKPHOS 35* 36* 37* 42  --   --   --   --   --   PROT 6.0* 6.0* 6.0* 6.0*  --   --   --   --   --   ALBUMIN 2.2* 2.3* 2.4* 2.6*   < > 3.2* 3.2* 3.0* 3.0*   < > = values in this interval not displayed.    Assessment and Plan: Pt familiar to IR service from left upper extremity fistulogram with thrombectomy-angioplasty/stenting in 2021 as well as balloon assisted percutaneous thrombin injection into pseudoaneurysm arising from the peripheral aspect of the stented cephalic vein on Q000111Q.  Past medical history significant for peripheral arterial disease, hypertension, end-stage renal disease, blindness, COVID-19 in January 2022 and I&D of the left breast abscess back in the end of January 2022 by Dr. Zenia Resides.  Cultures  grew mixed anaerobic flora.  She now presents with recurrent left breast discomfort/edema.  Left breast ultrasound on 3/1 revealed breast edema with large complex fluid collection within the retroareolar, lower and outer left breast.  Request now received from surgical service for image guided aspiration of this left breast fluid collection.  Patient is currently afebrile, WBC normal, hemoglobin 9.6, platelets normal.  Imaging studies have been reviewed by Dr. Pascal Lux.Risks and benefits of procedure was discussed with the patient including, but not limited to bleeding, infection, damage to adjacent structures or low yield requiring additional tests.  All of the questions were answered and there is agreement to proceed.  Consent signed and in chart.  Procedure scheduled for later today.   Electronically Signed: D. Rowe Robert, PA-C 02/24/2021, 11:25 AM   I spent a total of 25 minutes at the the patient's bedside AND on the patient's hospital floor or unit, greater than 50% of which was counseling/coordinating care for image guided aspiration of left breast fluid collection    Patient ID: Vilma Buckett, female   DOB: 11-06-1976, 45 y.o.   MRN: [id number]

## 2021-02-24 NOTE — Sedation Documentation (Signed)
Dr watts called to come to procedure room.

## 2021-02-24 NOTE — Progress Notes (Addendum)
[facility] Progress Note   Subjective:     Patient was seen and examined today on HD. She has no compliants at this time. Denies CP, SOB, ABD pain, N/V/D. She is awiting placement.  Objective Vitals:   02/24/21 0700 02/24/21 0723 02/24/21 0730 02/24/21 0800  BP: 139/76 139/87 131/86 122/70  Pulse:      Resp: '16 12 13   '$ Temp: 98.2 F (36.8 C)     TempSrc: Oral     SpO2: 99%     Weight: 97.1 kg     Height:       Physical Exam General:On HD; appears comfortable; no acute respiratory distress Heart: Normal S1 and S2; No murmurs, gallops, or friction rub Lungs:Clear throughout w/o wheezing, rales, or rhonchi Abdomen:soft, round non-tender Extremities:No LE edema Dialysis Access:L AVF (+) bruit/thrill  Filed Weights   02/22/21 0820 02/22/21 1145 02/24/21 0700  Weight: 105 kg 102 kg 97.1 kg    Intake/Output Summary (Last 24 hours) at 02/24/2021 0818 Last data filed at 02/23/2021 2000 Gross per 24 hour  Intake 1080 ml  Output --  Net 1080 ml    Additional Objective Labs: Basic Metabolic Panel: Recent Labs  Lab 02/19/21 0837 02/22/21 0852 02/24/21 0742  NA 135 133* 137  K 3.7 4.5 3.7  CL 94* 94* 100  CO2 '25 24 23  '$ GLUCOSE 121* 106* 112*  BUN 46* 67* 47*  CREATININE 11.68* 13.61* 9.23*  CALCIUM 10.7* 10.7* 9.8  PHOS 5.0* 5.3* 3.6   Liver Function Tests: Recent Labs  Lab 02/17/21 1312 02/19/21 0837 02/22/21 0852  ALBUMIN 3.2* 3.2* 3.0*   No results for input(s): LIPASE, AMYLASE in the last 168 hours. CBC: Recent Labs  Lab 02/17/21 1312 02/19/21 0836 02/22/21 0852 02/23/21 1028 02/24/21 0742  WBC 7.3 7.9 9.3 7.3 5.6  NEUTROABS  --   --   --  5.8  --   HGB 9.2* 9.4* 9.3* 9.8* 9.6*  HCT 29.6* 30.9* 30.6* 32.2* 30.5*  MCV 99.3 98.7 98.7 99.4 98.4  PLT 175 200 231 247 231   Blood Culture    Component Value Date/Time   SDES ABSCESS LEFT BREAST 01/16/2021 0009   SPECREQUEST NONE 01/16/2021 0009   CULT  01/16/2021 0009    MIXED  ANAEROBIC FLORA PRESENT.  CALL LAB IF FURTHER IID REQUIRED.   REPTSTATUS 01/21/2021 FINAL 01/16/2021 0009    Studies/Results: US BREAST LTD UNI LEFT INC AXILLA  Result Date: 02/23/2021 CLINICAL DATA:  Left breast swelling EXAM: ULTRASOUND OF THE left BREAST COMPARISON:  Ultrasound 01/05/2021, CT chest 01/05/2021 FINDINGS: Targeted ultrasound of the region of breast swelling is performed. Large complex fluid collection within the retroareolar left breast and extending to the 3 and 6 o'clock positions of the left breast 1-2 cm from nipple. Measured complex fluid collection at the 3 o'clock position 2 cm from nipple measures 6.2 x 3.9 cm. Deeper fluid collection at the 1 o'clock position measuring 5.1 x 2.4 cm. Generalized edema within the breast tissues. IMPRESSION: Breast edema with large complex fluid collection within the retroareolar, lower, and outer left breast. RECOMMENDATION: Recommend aspiration of complex fluid and or surgical consultation. Mammographic evaluation following resolution of acute symptoms should also be considered. BI-RADS CATEGORY  4: Suspicious. These results will be called to the ordering clinician or representative by the Radiologist Assistant, and communication documented in the PACS or [employer]. Electronically Signed   By: Donavan Foil M.D.   On: 02/23/2021 18:20    Medications: .  sodium chloride    . sodium chloride    . sodium chloride     . carvedilol  25 mg Oral BID WC  . Chlorhexidine Gluconate Cloth  6 each Topical Q0600  . clindamycin  300 mg Oral Q6H  . cloNIDine  0.3 mg Oral TID  . darbepoetin (ARANESP) injection - DIALYSIS  150 mcg Intravenous Q Mon-HD  . heparin  5,000 Units Subcutaneous Q8H  . multivitamin  1 tablet Oral QHS  . senna  1 tablet Oral BID  . sevelamer carbonate  3,200 mg Oral TID WC    Dialysis Orders: [facility] 4h 57mn 2/2.5 bath 105kg AVF  parsabiv 15 mg three times a week with HD mircera 150 mcg every 2 weeks -  last given 01/13/21 hectorol 3 mcg three times a week  Assessment/Plan: 1. ESRD: Continue HD per MWF schedule,currently receiving scheduled HD treatment today on schedule.  2. HD vascular access: Recurrentcannulationissuesw/AVF.IR completed f'gram 01/25/21 showing significant pseudoaneurysm s/p thrombin injection and near complete thrombosis of pseudoaneurysm per IR.Cannulation issues now appear to have resolved, will continue to monitor. 3. Left breast abscess - per previous Hospitalist note, L breast U/S performed which showed complex fluid collection. I&D is recommended. Surgery consulted. Now they are recommending IR aspiration 4. Covid-19: Incidentally found, asymptomatic. Out of isolation window now. 5. HTN: BP controlled, edema resolved -  below EDW, will need to be lowered at d/c. 6. 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 Tsat34%, ferritin [id number]. Metabolic bone disease- Phos improving - now at goal, continue binders- renvela.Corrected calcium high, hectorol on hold, use low Ca bath. Not able to get parsabiv here.  8. Social Issues:Difficult situation- pt homeless.Per Hospitalist note, anticipated d/c to SNF. SW involved.  CoTobie PoetNP [facility] 02/24/2021,8:18 AM  LOS: 40 days    Patient seen and examined, agree with above note with above modifications. No new issues-  Seen on HD-  Truly just awaiting disposition- is stable from a dialysis standpoint KeCorliss ParishMD 02/24/2021

## 2021-02-24 NOTE — Progress Notes (Signed)
[facility] PROGRESS NOTE  Rebekah Burton [id number] DOB: 1976-03-07 DOA: 01/15/2021 PCP: Trey Sailors, PA  Status:  Remains inpatient appropriate because:Unsafe d/c plan and Inpatient level of care appropriate due to severity of illness   Dispo: The patient is from: Homeless              Anticipated d/c is to: ALF vs SNF vs to be determined on 3/1 patient told [facility] staff that she did not wish to pursue skilled nursing facility and instead reported desire to be set up in a hotel.  Medicaid check not available until 3/11.  If medically stable before that date we will ask [facility] supervisor to assist with payment for motel room.              Patient currently is not medically stable to d/c.  Patient has had reemergence of left breast abscess and will be requiring close follow-up as well as oral antibiotics.  Will be undergoing an ultrasound-guided aspiration of the abscess in the left breast on 3/2 and if unsuccessful may require intraoperative debridement.   Difficult to place patient Yes   Level of care: Med-Surg  Code Status: Full Family Communication:  DVT prophylaxis: SQ Heparin Vaccination status: Unknown-Incidentally Covid positive during this admission (01/15/2021)  Foley catheter: No  HPI:  45 y.o. female with PMH significant for ESRD on HD MWF, blindness, hypertension, PAD. Patient presented to the ED on 01/15/2021 with breast abscess, underwent I&D by general surgery.  She was initially treated with IV antibiotics later completed a course with oral antibiotics.  She was also found to be incidental Covid positive. Now she is off contact precautions. 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 IR and vascular were consulted. aVF is now working.Dialysis per nephrology.   Subjective: Awake.  Examined during dialysis.  Was talking on phone.  She reported that the surgeon  updated her on her abnormality on ultrasound and would need a procedure to correct.  No reports regarding breast pain noting medications have been increased yesterday.  Objective: Vitals:   02/24/21 1028 02/24/21 1100  BP: (!) 142/94 113/83  Pulse:  79  Resp: 13 18  Temp: 98.3 F (36.8 C) 98 F (36.7 C)  SpO2: 98%     Intake/Output Summary (Last 24 hours) at 02/24/2021 1222 Last data filed at 02/24/2021 1028 Gross per 24 hour  Intake 600 ml  Output 3000 ml  Net -2400 ml   Filed Weights   02/22/21 1145 02/24/21 0700 02/24/21 1028  Weight: 102 kg 97.1 kg 96.8 kg    Exam:  Constitutional: Awake and alert, no acute distress. Breast: Left breast swollen, indurated/reddened -dressing intact Respiratory: Lungs are clear and she is stable on room air Cardiovascular: As regular nontachycardic, hemodynamically stable during dialysis, skin warm and dry Abdomen: Soft, nontender.  Tolerating diet.  Bowel sounds present. Neurologic: CN 2-12 grossly intact except for known blindness/visual impairment. Sensation intact, DTR normal. Strength 5/5 x all 4 extremities.  Psychiatric: Awake alert and oriented x3.  Pleasant affect.   Assessment/Plan: Acute problems: Breast abscess/recurrent -s/p incision drainage done by general surgery.-Pathology without any evidence of malignancy -She was started on Unasyn, cultures grew gram-positive cocci. She was transitioned to Augmentin and has completed the course. -On 3/1 patient as well as bedside nurse reported increasing pain and redness to left breast.  Exam consistent with reemergent abscess.  General surgery consulted -Ultrasound  of breast to include ultrasound-guided aspiration planned-aspiration completed but no cultures obtained. Complete ultrasound did reveal complex fluid collection in regards to the abscess with intraoperative I/D recommended.  Surgery later discussed with interventional radiology and they are planning on image guided aspiration  of left breast fluid collection that involves the retroareolar, lower and outer left breast on 3/2 -3/1 was resumed on Augmentin -Noticed subtle trend of increasing WBCs without leukocytosis with peak WBCs 9.3 on 2/28, down to 7.3 on 3/1, and after antibiotics initiated down to 5.6 on 3/2.  She remains afebrile. -Patient with increasing pain so pain medications changed from Vicodin 5-325 1-2 every 4 hours as needed to OxyIR 15 mg every 4 hours as needed plus Tylenol 325 mg every 4 hours as needed  Incidental COVID-19 -patient is asymptomatic, no evidence of pulmonary involvement.  -Off isolation.Currently on room air.  ESRD -nephrology was consulted, had issues with cannulation of aVF. IR and vascular was consulted and underwent fistulogram demonstrated pseudoaneurysm.  -AVF is now working.  -Dialysis per nephrology.  -Continue sevelamer.  Anemia of chronic disease -monitor intermittently, hemoglobin is stable at 9.1. Continue Aranesp.   Other problems:  Hyponatremia -patient on hemodialysis, nephrology following. Last Labs         Recent Labs  Lab 02/16/21 1204 02/17/21 1312 02/19/21 0837 02/22/21 0852  NA 132* 134* 135 133*     Hypertension -Blood pressure controlled on clonidine and Coreg.  Peripheral artery disease -hx of Fem-pop bypass. -Has history of left transmetatarsal amputation.    Data Reviewed: Basic Metabolic Panel: Recent Labs  Lab 02/17/21 1312 02/19/21 0837 02/22/21 0852 02/24/21 0742  NA 134* 135 133* 137  K 3.8 3.7 4.5 3.7  CL 96* 94* 94* 100  CO2 '24 25 24 23  '$ GLUCOSE 144* 121* 106* 112*  BUN 44* 46* 67* 47*  CREATININE 11.13* 11.68* 13.61* 9.23*  CALCIUM 10.1 10.7* 10.7* 9.8  PHOS 5.4* 5.0* 5.3* 3.6   Liver Function Tests: Recent Labs  Lab 02/17/21 1312 02/19/21 0837 02/22/21 0852 02/24/21 0742  ALBUMIN 3.2* 3.2* 3.0* 3.0*   No results for input(s): LIPASE, AMYLASE in the last 168 hours. No results for input(s):  AMMONIA in the last 168 hours. CBC: Recent Labs  Lab 02/17/21 1312 02/19/21 0836 02/22/21 0852 02/23/21 1028 02/24/21 0742  WBC 7.3 7.9 9.3 7.3 5.6  NEUTROABS  --   --   --  5.8  --   HGB 9.2* 9.4* 9.3* 9.8* 9.6*  HCT 29.6* 30.9* 30.6* 32.2* 30.5*  MCV 99.3 98.7 98.7 99.4 98.4  PLT 175 200 231 247 231   Cardiac Enzymes: No results for input(s): CKTOTAL, CKMB, CKMBINDEX, TROPONINI in the last 168 hours. BNP (last 3 results) Recent Labs    01/18/21 0136 01/19/21 0030 01/20/21 0020  BNP 1,410.7* 1,122.4* 1,383.7*    ProBNP (last 3 results) No results for input(s): PROBNP in the last 8760 hours.  CBG: No results for input(s): GLUCAP in the last 168 hours.  No results found for this or any previous visit (from the past 240 hour(s)).   Studies: US BREAST LTD UNI LEFT INC AXILLA  Result Date: 02/23/2021 CLINICAL DATA:  Left breast swelling EXAM: ULTRASOUND OF THE left BREAST COMPARISON:  Ultrasound 01/05/2021, CT chest 01/05/2021 FINDINGS: Targeted ultrasound of the region of breast swelling is performed. Large complex fluid collection within the retroareolar left breast and extending to the 3 and 6 o'clock positions of the left breast 1-2 cm from nipple. Measured complex  fluid collection at the 3 o'clock position 2 cm from nipple measures 6.2 x 3.9 cm. Deeper fluid collection at the 1 o'clock position measuring 5.1 x 2.4 cm. Generalized edema within the breast tissues. IMPRESSION: Breast edema with large complex fluid collection within the retroareolar, lower, and outer left breast. RECOMMENDATION: Recommend aspiration of complex fluid and or surgical consultation. Mammographic evaluation following resolution of acute symptoms should also be considered. BI-RADS CATEGORY  4: Suspicious. These results will be called to the ordering clinician or representative by the Radiologist Assistant, and communication documented in the PACS or [employer]. Electronically Signed   By: Donavan Foil M.D.   On: 02/23/2021 18:20    Scheduled Meds: . carvedilol  25 mg Oral BID WC  . Chlorhexidine Gluconate Cloth  6 each Topical Q0600  . cloNIDine  0.3 mg Oral TID  . darbepoetin (ARANESP) injection - DIALYSIS  150 mcg Intravenous Q Mon-HD  . heparin  5,000 Units Subcutaneous Q8H  . multivitamin  1 tablet Oral QHS  . senna  1 tablet Oral BID  . sevelamer carbonate  3,200 mg Oral TID WC   Continuous Infusions: . sodium chloride    . clindamycin (CLEOCIN) IV      Active Problems:   ESRD (end stage renal disease) ([place])   Breast abscess   COVID-19 virus infection   Breast abscess of female   Consultants:  Nephrology  General surgery  Interventional radiology  Procedures:  Aspiration of breast abscess per IR 3/1 with further image guided aspiration of complex breast abscess planned for 3/2  Antibiotics: Anti-infectives (From admission, onward)   Start     Dose/Rate Route Frequency Ordered Stop   02/24/21 2200  amoxicillin-clavulanate (AUGMENTIN) 500-125 MG per tablet 500 mg  Status:  Discontinued        1 tablet Oral Daily at bedtime 02/23/21 1149 02/24/21 0817   02/24/21 1200  clindamycin (CLEOCIN) capsule 300 mg  Status:  Discontinued        300 mg Oral Every 6 hours 02/24/21 0817 02/24/21 0952   02/24/21 1200  clindamycin (CLEOCIN) IVPB 600 mg        600 mg 100 mL/hr over 30 Minutes Intravenous Every 6 hours 02/24/21 0952     02/23/21 1245  amoxicillin-clavulanate (AUGMENTIN) 500-125 MG per tablet 500 mg        1 tablet Oral  Once 02/23/21 1149 02/23/21 1445   02/23/21 1215  amoxicillin-clavulanate (AUGMENTIN) 875-125 MG per tablet 1 tablet  Status:  Discontinued        1 tablet Oral Every 12 hours 02/23/21 1120 02/23/21 1149   01/23/21 1400  amoxicillin-clavulanate (AUGMENTIN) 500-125 MG per tablet 500 mg        500 mg Oral Every 24 hours 01/23/21 1124 01/25/21 1657   01/23/21 1015  amoxicillin-clavulanate (AUGMENTIN) 875-125 MG per tablet 1 tablet   Status:  Discontinued        1 tablet Oral Every 12 hours 01/23/21 1002 01/23/21 1124   01/20/21 0845  vancomycin (VANCOCIN) 1-5 GM/200ML-% IVPB       Note to Pharmacy: Judieth Keens  : cabinet override      01/20/21 0845 01/20/21 2059   01/18/21 1200  vancomycin (VANCOCIN) IVPB 1000 mg/200 mL premix  Status:  Discontinued        1,000 mg 200 mL/hr over 60 Minutes Intravenous Every M-W-F (Hemodialysis) 01/16/21 1017 01/21/21 1324   01/17/21 2200  Ampicillin-Sulbactam (UNASYN) 3 g in sodium chloride 0.9 %  100 mL IVPB  Status:  Discontinued        3 g 200 mL/hr over 30 Minutes Intravenous Daily at bedtime 01/16/21 1058 01/23/21 1002   01/17/21 1000  remdesivir 100 mg in sodium chloride 0.9 % 100 mL IVPB       "Followed by" Linked Group Details   100 mg 200 mL/hr over 30 Minutes Intravenous Daily 01/15/21 2357 01/18/21 0918   01/16/21 1145  Ampicillin-Sulbactam (UNASYN) 3 g in sodium chloride 0.9 % 100 mL IVPB        3 g 200 mL/hr over 30 Minutes Intravenous  Once 01/16/21 1058 01/16/21 1417   01/16/21 1100  vancomycin (VANCOREADY) IVPB 1500 mg/300 mL        1,500 mg 150 mL/hr over 120 Minutes Intravenous  Once 01/16/21 1017 01/16/21 1849   01/16/21 0300  remdesivir 200 mg in sodium chloride 0.9% 250 mL IVPB       "Followed by" Linked Group Details   200 mg 580 mL/hr over 30 Minutes Intravenous Once 01/15/21 2357 01/16/21 0543   01/15/21 2345  amoxicillin-clavulanate (AUGMENTIN) 875-125 MG per tablet 1 tablet  Status:  Discontinued        1 tablet Oral Every 12 hours 01/15/21 2334 01/15/21 2357   01/15/21 1800  vancomycin (VANCOCIN) IVPB 1000 mg/200 mL premix        1,000 mg 200 mL/hr over 60 Minutes Intravenous  Once 01/15/21 1753 01/15/21 2120       Time spent: 30 minutes    Erin Hearing ANP  [facility] 7 am - 330 pm/M-F for direct patient care and secure chat Please refer to Amion for contact info 40  days

## 2021-02-24 NOTE — Sedation Documentation (Signed)
nsr

## 2021-02-24 NOTE — Progress Notes (Signed)
Patient seen in HD this AM. [place] of L breast and axilla shows L breast abscess as suspected. Patient reports continued tenderness of L breast. Recommend IR aspiration of L breast abscess. Exam deferred at this time since patient was seen in dialysis. In review of previous cultures from prior I&D site, felt clindamycin would provide better coverage than augmentin. Recommend mammogram or breast MRI in the future to rule out underlying inflammatory breast cancer as source of recurrent breast abscesses.   Norm Parcel, [facility] Surgery 02/24/2021, 9:23 AM Please see Amion for pager number during day hours 7:00am-4:30pm  NO CHARGE

## 2021-02-24 NOTE — Progress Notes (Signed)
Report given to HD nurse. HD nurses said patient will go for HD this morning.

## 2021-02-24 NOTE — Procedures (Signed)
Pre Procedure Dx: Left breast abscess Post Procedural Dx: Same  Technically successful US guided aspiration of 3 cc of purulent appearing thick fluid from markedly complex abscess sub-jacent to the nipple. Despite coiling a wire within the collection, additional fluid could NOT be aspirated with placement of a 5 Fr Yueth sheath catheter.  All aspirated fluid was sent to the lab for analysis.   Small amount of additional purulent fluid was aspirated with manual compression, primarily from the open wound inferior to the nipple.   EBL: None No immediate complications.   Ronny Bacon, MD Pager #: [phone]

## 2021-02-24 NOTE — Progress Notes (Signed)
CSW spoke with Osa Craver, social worker for the Blind through [facility] Ms. Kenton Kingfisher states she will contact the patient to discuss available services.  Madilyn Fireman, MSW, LCSW Transitions of Care  Clinical Social Worker II [phone]

## 2021-02-24 NOTE — Sedation Documentation (Signed)
Pt tolerated procedure very well. Report given to RN on floor.  Totals: Time: 14 mins Fentanyl: 165mg Versed: '2mg'$ 

## 2021-02-24 NOTE — TOC Progression Note (Addendum)
Transition of Care [facility]) - Progression Note    Patient Details  Name: Rebekah Burton MRN: [id number] Date of Birth: 1976-12-26  Transition of [facility]) CM/SW Contact  Curlene Labrum, RN Phone Number: 02/24/2021, 12:18 PM  Clinical Narrative:    Case management met with the patient at the bedside regarding transitions of care.  The patient was in HD unit when assessed at bedside and the patient is waiting for f/u IR procedure for left breast abscess aspiration.  I spoke with Madilyn Fireman, CSW (see note) regarding plan to discharge to hotel/ apartment through [place] project until the patient is able to establish long term apartment housing through [employer].  CM will continue to follow the patient for possible home health needs for RN, MSW to follow the patient to home if patient needs assistance with wound management and MSW follow up after discharge.  CM will continue to follow the patient for discharge needs.   Expected Discharge Plan: Home/Self Care Barriers to Discharge: Continued Medical Work up  Expected Discharge Plan and Services Expected Discharge Plan: Home/Self Care In-house Referral: Clinical Social Work Discharge Planning Services: CM Consult Post Acute Care Choice: Dialysis Living arrangements for the past 2 months: Single Family Home                 DME Arranged: N/A DME Agency: NA       HH Arranged: RN [place] Agency: [facility] Date HH Agency Contacted: 01/21/21 Time [place]: 3151 Representative spoke with at [place]: [place] ([place]) Interventions    Readmission Risk Interventions No flowsheet data found.

## 2021-02-24 NOTE — Sedation Documentation (Signed)
Pt is awake and alert, and oriented x4. Consent signed, pt npo. Monitor applied to pt, vitals stable. Prepping pt for procedure at this time.

## 2021-02-24 NOTE — Sedation Documentation (Signed)
Pt is sleeping, procedure continues.

## 2021-02-25 ENCOUNTER — Encounter (HOSPITAL_COMMUNITY): Payer: Self-pay | Admitting: Internal Medicine

## 2021-02-25 ENCOUNTER — Encounter (HOSPITAL_COMMUNITY)
Admission: EM | Disposition: A | Discharge status: Home Health | Payer: Self-pay | Source: Home / Self Care | Attending: Internal Medicine

## 2021-02-25 ENCOUNTER — Inpatient Hospital Stay (HOSPITAL_COMMUNITY): Payer: Medicare HMO | Admitting: Certified Registered Nurse Anesthetist

## 2021-02-25 DIAGNOSIS — N611 Abscess of the breast and nipple: Secondary | ICD-10-CM | POA: Diagnosis not present

## 2021-02-25 DIAGNOSIS — U071 COVID-19: Secondary | ICD-10-CM | POA: Diagnosis not present

## 2021-02-25 DIAGNOSIS — Z659 Problem related to unspecified psychosocial circumstances: Secondary | ICD-10-CM

## 2021-02-25 DIAGNOSIS — N186 End stage renal disease: Secondary | ICD-10-CM | POA: Diagnosis not present

## 2021-02-25 HISTORY — PX: INCISION AND DRAINAGE ABSCESS: SHX5864

## 2021-02-25 LAB — POCT I-STAT, CHEM 8
BUN: 45 mg/dL — ABNORMAL HIGH (ref 6–20)
Calcium, Ion: 1.06 mmol/L — ABNORMAL LOW (ref 1.15–1.40)
Chloride: 100 mmol/L (ref 98–111)
Creatinine, Ser: 10.5 mg/dL — ABNORMAL HIGH (ref 0.44–1.00)
Glucose, Bld: 112 mg/dL — ABNORMAL HIGH (ref 70–99)
HCT: 37 % (ref 36.0–46.0)
Hemoglobin: 12.6 g/dL (ref 12.0–15.0)
Potassium: 4.3 mmol/L (ref 3.5–5.1)
Sodium: 134 mmol/L — ABNORMAL LOW (ref 135–145)
TCO2: 26 mmol/L (ref 22–32)

## 2021-02-25 LAB — SURGICAL PCR SCREEN
MRSA, PCR: NEGATIVE
Staphylococcus aureus: NEGATIVE

## 2021-02-25 SURGERY — INCISION AND DRAINAGE, ABSCESS
Anesthesia: General | Site: Breast | Laterality: Left

## 2021-02-25 MED ORDER — LIDOCAINE-PRILOCAINE 2.5-2.5 % EX CREA: 1.0000 "application " | TOPICAL_CREAM | CUTANEOUS | Status: DC | PRN

## 2021-02-25 MED ORDER — SODIUM CHLORIDE 0.9 % IV SOLN: INTRAVENOUS | Status: DC

## 2021-02-25 MED ORDER — SODIUM CHLORIDE 0.9 % IV SOLN
100.0000 mL | INTRAVENOUS | Status: DC | PRN
Start: 2021-02-25 — End: 2021-02-26

## 2021-02-25 MED ORDER — ONDANSETRON HCL 4 MG/2ML IJ SOLN
INTRAMUSCULAR | Status: AC
  Filled 2021-02-25: qty 2

## 2021-02-25 MED ORDER — PHENYLEPHRINE HCL-NACL 10-0.9 MG/250ML-% IV SOLN
INTRAVENOUS | Status: DC | PRN
  Administered 2021-02-25: 30 ug/min via INTRAVENOUS

## 2021-02-25 MED ORDER — PENTAFLUOROPROP-TETRAFLUOROETH EX AERO: 1.0000 "application " | INHALATION_SPRAY | CUTANEOUS | Status: DC | PRN

## 2021-02-25 MED ORDER — FENTANYL CITRATE (PF) 100 MCG/2ML IJ SOLN
25.0000 ug | INTRAMUSCULAR | Status: DC | PRN
  Administered 2021-02-25 (×2): 50 ug via INTRAVENOUS

## 2021-02-25 MED ORDER — ONDANSETRON HCL 4 MG/2ML IJ SOLN
INTRAMUSCULAR | Status: DC | PRN
  Administered 2021-02-25: 4 mg via INTRAVENOUS

## 2021-02-25 MED ORDER — LIDOCAINE 2% (20 MG/ML) 5 ML SYRINGE
INTRAMUSCULAR | Status: AC
  Filled 2021-02-25: qty 5

## 2021-02-25 MED ORDER — PROMETHAZINE HCL 25 MG/ML IJ SOLN: 6.2500 mg | INTRAMUSCULAR | Status: DC | PRN

## 2021-02-25 MED ORDER — LIDOCAINE 2% (20 MG/ML) 5 ML SYRINGE
INTRAMUSCULAR | Status: DC | PRN
  Administered 2021-02-25: 60 mg via INTRAVENOUS

## 2021-02-25 MED ORDER — CHLORHEXIDINE GLUCONATE 0.12 % MT SOLN
OROMUCOSAL | Status: AC
  Filled 2021-02-25: qty 15

## 2021-02-25 MED ORDER — CHLORHEXIDINE GLUCONATE 0.12 % MT SOLN
15.0000 mL | OROMUCOSAL | Status: AC
  Administered 2021-02-25: 15 mL via OROMUCOSAL

## 2021-02-25 MED ORDER — ALTEPLASE 2 MG IJ SOLR: 2.0000 mg | Freq: Once | INTRAMUSCULAR | Status: DC | PRN

## 2021-02-25 MED ORDER — PROPOFOL 10 MG/ML IV BOLUS
INTRAVENOUS | Status: DC | PRN
  Administered 2021-02-25: 120 mg via INTRAVENOUS

## 2021-02-25 MED ORDER — SODIUM CHLORIDE 0.9 % IV SOLN: 100.0000 mL | INTRAVENOUS | Status: DC | PRN

## 2021-02-25 MED ORDER — PHENYLEPHRINE 40 MCG/ML (10ML) SYRINGE FOR IV PUSH (FOR BLOOD PRESSURE SUPPORT)
PREFILLED_SYRINGE | INTRAVENOUS | Status: DC | PRN
  Administered 2021-02-25 (×3): 80 ug via INTRAVENOUS
  Administered 2021-02-25 (×2): 120 ug via INTRAVENOUS

## 2021-02-25 MED ORDER — CHLORHEXIDINE GLUCONATE 0.12 % MT SOLN
15.0000 mL | OROMUCOSAL | Status: DC
  Filled 2021-02-25: qty 15

## 2021-02-25 MED ORDER — DEXAMETHASONE SODIUM PHOSPHATE 10 MG/ML IJ SOLN
INTRAMUSCULAR | Status: DC | PRN
  Administered 2021-02-25: 10 mg via INTRAVENOUS

## 2021-02-25 MED ORDER — CHLORHEXIDINE GLUCONATE CLOTH 2 % EX PADS
6.0000 | MEDICATED_PAD | Freq: Once | CUTANEOUS | Status: AC
  Administered 2021-02-25: 6 via TOPICAL

## 2021-02-25 MED ORDER — FENTANYL CITRATE (PF) 100 MCG/2ML IJ SOLN
INTRAMUSCULAR | Status: AC
  Filled 2021-02-25: qty 2

## 2021-02-25 MED ORDER — FENTANYL CITRATE (PF) 250 MCG/5ML IJ SOLN
INTRAMUSCULAR | Status: AC
  Filled 2021-02-25: qty 5

## 2021-02-25 MED ORDER — PROPOFOL 10 MG/ML IV BOLUS
INTRAVENOUS | Status: AC
  Filled 2021-02-25: qty 40

## 2021-02-25 MED ORDER — 0.9 % SODIUM CHLORIDE (POUR BTL) OPTIME
TOPICAL | Status: DC | PRN
  Administered 2021-02-25: 1000 mL

## 2021-02-25 MED ORDER — HEPARIN SODIUM (PORCINE) 1000 UNIT/ML DIALYSIS: 1000.0000 [IU] | INTRAMUSCULAR | Status: DC | PRN

## 2021-02-25 MED ORDER — HEPARIN SODIUM (PORCINE) 1000 UNIT/ML DIALYSIS: 20.0000 [IU]/kg | INTRAMUSCULAR | Status: DC | PRN

## 2021-02-25 MED ORDER — MIDAZOLAM HCL 2 MG/2ML IJ SOLN
INTRAMUSCULAR | Status: DC | PRN
  Administered 2021-02-25: 2 mg via INTRAVENOUS

## 2021-02-25 MED ORDER — LIDOCAINE HCL (PF) 1 % IJ SOLN
5.0000 mL | INTRAMUSCULAR | Status: DC | PRN
Start: 2021-02-25 — End: 2021-02-26

## 2021-02-25 MED ORDER — DEXAMETHASONE SODIUM PHOSPHATE 10 MG/ML IJ SOLN
INTRAMUSCULAR | Status: AC
  Filled 2021-02-25: qty 1

## 2021-02-25 MED ORDER — MIDAZOLAM HCL 2 MG/2ML IJ SOLN
INTRAMUSCULAR | Status: AC
  Filled 2021-02-25: qty 2

## 2021-02-25 MED ORDER — FENTANYL CITRATE (PF) 250 MCG/5ML IJ SOLN
INTRAMUSCULAR | Status: DC | PRN
  Administered 2021-02-25: 50 ug via INTRAVENOUS

## 2021-02-25 SURGICAL SUPPLY — 28 items
BNDG GAUZE ELAST 4 BULKY (GAUZE/BANDAGES/DRESSINGS) IMPLANT
CANISTER SUCT 3000ML PPV (MISCELLANEOUS) ×2 IMPLANT
COVER SURGICAL LIGHT HANDLE (MISCELLANEOUS) ×2 IMPLANT
COVER WAND RF STERILE (DRAPES) ×2 IMPLANT
DRAPE LAPAROSCOPIC ABDOMINAL (DRAPES) ×2 IMPLANT
DRAPE UTILITY XL STRL (DRAPES) ×2 IMPLANT
DRSG PAD ABDOMINAL 8X10 ST (GAUZE/BANDAGES/DRESSINGS) IMPLANT
ELECT CAUTERY BLADE 6.4 (BLADE) ×2 IMPLANT
ELECT REM PT RETURN 9FT ADLT (ELECTROSURGICAL) ×2
ELECTRODE REM PT RTRN 9FT ADLT (ELECTROSURGICAL) ×1 IMPLANT
GAUZE SPONGE 4X4 12PLY STRL (GAUZE/BANDAGES/DRESSINGS) IMPLANT
GLOVE BIO SURGEON STRL SZ8 (GLOVE) ×2 IMPLANT
GLOVE SRG 8 PF TXTR STRL LF DI (GLOVE) ×1 IMPLANT
GLOVE SURG UNDER POLY LF SZ8 (GLOVE) ×1
GOWN STRL REUS W/ TWL LRG LVL3 (GOWN DISPOSABLE) ×1 IMPLANT
GOWN STRL REUS W/ TWL XL LVL3 (GOWN DISPOSABLE) ×1 IMPLANT
GOWN STRL REUS W/TWL LRG LVL3 (GOWN DISPOSABLE) ×1
GOWN STRL REUS W/TWL XL LVL3 (GOWN DISPOSABLE) ×1
KIT BASIN OR (CUSTOM PROCEDURE TRAY) ×2 IMPLANT
KIT TURNOVER KIT B (KITS) ×2 IMPLANT
NS IRRIG 1000ML POUR BTL (IV SOLUTION) ×2 IMPLANT
PACK GENERAL/GYN (CUSTOM PROCEDURE TRAY) ×2 IMPLANT
PAD ARMBOARD 7.5X6 YLW CONV (MISCELLANEOUS) ×2 IMPLANT
PENCIL SMOKE EVACUATOR (MISCELLANEOUS) ×2 IMPLANT
SWAB COLLECTION DEVICE MRSA (MISCELLANEOUS) IMPLANT
SWAB CULTURE ESWAB REG 1ML (MISCELLANEOUS) IMPLANT
TOWEL GREEN STERILE (TOWEL DISPOSABLE) ×2 IMPLANT
TOWEL GREEN STERILE FF (TOWEL DISPOSABLE) ×2 IMPLANT

## 2021-02-25 NOTE — Anesthesia Procedure Notes (Signed)
Procedure Name: LMA Insertion Date/Time: 02/25/2021 12:36 PM Performed by: Darletta Moll, CRNA Pre-anesthesia Checklist: Patient identified, Emergency Drugs available, Suction available and Patient being monitored Patient Re-evaluated:Patient Re-evaluated prior to induction Oxygen Delivery Method: Circle system utilized Preoxygenation: Pre-oxygenation with 100% oxygen Induction Type: IV induction Ventilation: Mask ventilation without difficulty LMA: LMA inserted LMA Size: 4.0 Number of attempts: 1 Placement Confirmation: positive ETCO2,  breath sounds checked- equal and bilateral and CO2 detector Tube secured with: Tape Dental Injury: Teeth and Oropharynx as per pre-operative assessment

## 2021-02-25 NOTE — Anesthesia Preprocedure Evaluation (Addendum)
Anesthesia Evaluation  Patient identified by MRN, date of birth, ID band Patient awake    Reviewed: Allergy & Precautions, NPO status , Patient's Chart, lab work & pertinent test results  History of Anesthesia Complications Negative for: history of anesthetic complications  Airway Mallampati: II  TM Distance: >3 FB Neck ROM: Full    Dental  (+) Dental Advisory Given, Teeth Intact   Pulmonary former smoker,    Pulmonary exam normal        Cardiovascular hypertension, Pt. on medications and Pt. on home beta blockers + Peripheral Vascular Disease  Normal cardiovascular exam     Neuro/Psych negative neurological ROS  negative psych ROS   GI/Hepatic negative GI ROS, Neg liver ROS,   Endo/Other   Obesity   Renal/GU ESRF and DialysisRenal disease     Musculoskeletal negative musculoskeletal ROS (+)   Abdominal (+) + obese,   Peds  Hematology  (+) anemia ,   Anesthesia Other Findings Blind   Reproductive/Obstetrics                            Anesthesia Physical Anesthesia Plan  ASA: III  Anesthesia Plan: General   Post-op Pain Management:    Induction: Intravenous  PONV Risk Score and Plan: 3 and Treatment may vary due to age or medical condition, Ondansetron, Midazolam and Dexamethasone  Airway Management Planned: LMA  Additional Equipment: None  Intra-op Plan:   Post-operative Plan: Extubation in OR  Informed Consent: I have reviewed the patients History and Physical, chart, labs and discussed the procedure including the risks, benefits and alternatives for the proposed anesthesia with the patient or authorized representative who has indicated his/her understanding and acceptance.     Dental advisory given  Plan Discussed with: CRNA and Anesthesiologist  Anesthesia Plan Comments:        Anesthesia Quick Evaluation

## 2021-02-25 NOTE — Progress Notes (Signed)
Physical Therapy Treatment Patient Details Name: Rebekah Burton MRN: [id number] DOB: May 31, 1976 Today's Date: 02/25/2021    History of Present Illness 45 y.o. female with medical history significant of ESRD-HD M-W-F, blindness, HTN, PAD had developed a breast abscess and was seen in ED 01/04/21 where U/S confirmed abscess. She was to have been seen as outpatient for aspiration/drainage but did not get to make that appt. Today in HD the breast abscess opened and drained spontaneously. Because of continued drainage she presents to [facility] for evaluation. Found to be COVID+ taken to OR for further exploration, culture, and excisional debridement 01/15/21    PT Comments    Pt received in bed, very concerned about her L breast abscess/procedure today but agreed to participate in PT. Due to increased pain in L breast and knees, did not progress single leg activities. Reviewed side stepping and backwards walking. Increased drift to left in backwards walking. Verbal cueing for navigation. Also completed 10 sit to stands to address LE strength. Needed raised height of bed due to knee pain. Cueing to complete with hands at sides or over chest to engage LE muscles. Pt left in bed with all needs met, call bell within reach, and RN aware of status.    Follow Up Recommendations  No PT follow up;Supervision for mobility/OOB     Equipment Recommendations  None recommended by PT    Recommendations for Other Services       Precautions / Restrictions Precautions Precautions: Other (comment) Precaution Comments: pt is blind, needs assist for navigation, L breast abscess Restrictions Weight Bearing Restrictions: No    Mobility  Bed Mobility Overal bed mobility: Modified Independent Bed Mobility: Supine to Sit;Sit to Supine     Supine to sit: Modified independent (Device/Increase time) Sit to supine: Modified independent (Device/Increase time)        Transfers Overall transfer level: Modified  independent   Transfers: Sit to/from Stand Sit to Stand: Modified independent (Device/Increase time)            Ambulation/Gait   Gait Distance (Feet): 200 Feet (50 side stepping to left, 50 side stepping to right, 80 backwards, 20 fowards) Assistive device: None Gait Pattern/deviations: Step-through pattern;Decreased step length - right;Decreased step length - left;Drifts right/left     General Gait Details: Cues for navigation, increased sway today and drifting to L   Stairs             Wheelchair Mobility    Modified Rankin (Stroke Patients Only)       Balance Overall balance assessment: Needs assistance Sitting-balance support: No upper extremity supported;Feet supported Sitting balance-Leahy Scale: Normal     Standing balance support: During functional activity;No upper extremity supported Standing balance-Leahy Scale: Good Standing balance comment: Steady in static standing and foward walking                            Cognition Arousal/Alertness: Awake/alert Behavior During Therapy: WFL for tasks assessed/performed Overall Cognitive Status: Within Functional Limits for tasks assessed                                 General Comments: pleasant and cooperative, willing to try new exercises, aware of deficits and abilities      Exercises Other Exercises Other Exercises: Sit to stands, x10 (without use of arms to push from EOB, raised height of bed) Other Exercises:  (Increased  sway (more with R lateral step taps))    General Comments        Pertinent Vitals/Pain Pain Assessment: Faces Faces Pain Scale: Hurts whole lot Pain Location: L breast abscess Pain Descriptors / Indicators: Discomfort;Aching;Guarding Pain Intervention(s): Monitored during session    Home Living                      Prior Function            PT Goals (current goals can now be found in the care plan section)      Frequency     Min 2X/week      PT Plan Current plan remains appropriate    Co-evaluation              AM-PAC PT "6 Clicks" Mobility   Outcome Measure  Help needed turning from your back to your side while in a flat bed without using bedrails?: None Help needed moving from lying on your back to sitting on the side of a flat bed without using bedrails?: None Help needed moving to and from a bed to a chair (including a wheelchair)?: None Help needed standing up from a chair using your arms (e.g., wheelchair or bedside chair)?: None Help needed to walk in hospital room?: A Little Help needed climbing 3-5 steps with a railing? : A Little 6 Click Score: 22    End of Session Equipment Utilized During Treatment: Gait belt Activity Tolerance: Patient tolerated treatment well Patient left: with call bell/phone within reach;in bed (sitting at EOB per her request) Nurse Communication: Mobility status PT Visit Diagnosis: Other abnormalities of gait and mobility (R26.89);Pain Pain - part of body:  (L breast and UE)     Time:  -     Charges:                        Rosita Kea, SPT

## 2021-02-25 NOTE — Progress Notes (Signed)
43 Days Post-Op  Subjective: Very minimal amount of fluid aspirated from left breast yesterday. Patient reports continued pain and discomfort.   Objective: Vital signs in last 24 hours: Temp:  [98 F (36.7 C)-98.4 F (36.9 C)] 98.4 F (36.9 C) (03/03 0458) Pulse Rate:  [70-81] 70 (03/03 0458) Resp:  [12-20] 16 (03/03 0458) BP: (93-142)/(43-94) 127/66 (03/03 0458) SpO2:  [98 %-100 %] 98 % (03/03 0458) Weight:  [96.8 kg] 96.8 kg (03/02 1028) Last BM Date: 02/23/21  Intake/Output from previous day: 03/02 0701 - 03/03 0700 In: 490 [P.O.:440; IV Piggyback:50] Out: 3000  Intake/Output this shift: No intake/output data recorded.  PE: General: resting comfortably, NAD Neuro: alert and oriented, no focal deficits Resp: normal work of breathing Breast: left breast is indurated and erythematous, especially on the inferior aspect, with tenderness to palpation  Lab Results:  Recent Labs    02/23/21 1028 02/24/21 0742  WBC 7.3 5.6  HGB 9.8* 9.6*  HCT 32.2* 30.5*  PLT 247 231   BMET Recent Labs    02/22/21 0852 02/24/21 0742  NA 133* 137  K 4.5 3.7  CL 94* 100  CO2 24 23  GLUCOSE 106* 112*  BUN 67* 47*  CREATININE 13.61* 9.23*  CALCIUM 10.7* 9.8   PT/INR No results for input(s): LABPROT, INR in the last 72 hours. CMP     Component Value Date/Time   NA 137 02/24/2021 0742   K 3.7 02/24/2021 0742   CL 100 02/24/2021 0742   CO2 23 02/24/2021 0742   GLUCOSE 112 (H) 02/24/2021 0742   BUN 47 (H) 02/24/2021 0742   CREATININE 9.23 (H) 02/24/2021 0742   CALCIUM 9.8 02/24/2021 0742   PROT 6.0 (L) 01/23/2021 0426   ALBUMIN 3.0 (L) 02/24/2021 0742   AST 46 (H) 01/23/2021 0426   ALT 35 01/23/2021 0426   ALKPHOS 42 01/23/2021 0426   BILITOT 0.7 01/23/2021 0426   GFRNONAA 5 (L) 02/24/2021 0742   GFRAA 5 (L) 02/13/2019 0734   Lipase     Component Value Date/Time   LIPASE 33 07/17/2018 1145       Studies/Results: US BREAST LTD UNI LEFT INC AXILLA  Result  Date: 02/23/2021 CLINICAL DATA:  Left breast swelling EXAM: ULTRASOUND OF THE left BREAST COMPARISON:  Ultrasound 01/05/2021, CT chest 01/05/2021 FINDINGS: Targeted ultrasound of the region of breast swelling is performed. Large complex fluid collection within the retroareolar left breast and extending to the 3 and 6 o'clock positions of the left breast 1-2 cm from nipple. Measured complex fluid collection at the 3 o'clock position 2 cm from nipple measures 6.2 x 3.9 cm. Deeper fluid collection at the 1 o'clock position measuring 5.1 x 2.4 cm. Generalized edema within the breast tissues. IMPRESSION: Breast edema with large complex fluid collection within the retroareolar, lower, and outer left breast. RECOMMENDATION: Recommend aspiration of complex fluid and or surgical consultation. Mammographic evaluation following resolution of acute symptoms should also be considered. BI-RADS CATEGORY  4: Suspicious. These results will be called to the ordering clinician or representative by the Radiologist Assistant, and communication documented in the PACS or [employer]. Electronically Signed   By: Donavan Foil M.D.   On: 02/23/2021 18:20     Assessment/Plan 45 yo female with recurrent left breast abscess. Persistent cellulitis with only minimal fluid aspirated yesterday. Will proceed to OR today for operative incision and drainage, as well as a bunch biopsy of the skin to evaluate for inflammatory breast cancer. - Please  keep NPO for OR - Continue antibiotics    LOS: 43 days    Michaelle Birks, MD [facility] Surgery General, Hepatobiliary and Pancreatic Surgery 02/25/21 8:24 AM

## 2021-02-25 NOTE — TOC Progression Note (Signed)
Transition of Care [facility]) - Progression Note    Patient Details  Name: Wetona Viramontes MRN: [id number] Date of Birth: [birth year]  Transition of Care [facility]) CM/SW Contact  Curlene Labrum, RN Phone Number: 02/25/2021, 12:53 PM  Clinical Narrative:    Case management met with the patient at the bedside regarding transitions of care.  The patient had a IR procedure for abscess aspiration yesterday and patient continues to have tenderness and pain and explains that surgery plans to schedule her for an I&D in the OR today for surgical debridement and irrigation along with biopsies to R/O malignancy.    CM and MSW will continue to follow the patient for transitions of care needs.   Expected Discharge Plan: Home/Self Care Barriers to Discharge: Continued Medical Work up  Expected Discharge Plan and Services Expected Discharge Plan: Home/Self Care In-house Referral: Clinical Social Work Discharge Planning Services: CM Consult Post Acute Care Choice: Dialysis Living arrangements for the past 2 months: Single Family Home                 DME Arranged: N/A DME Agency: NA       HH Arranged: RN [place] Agency: [facility] Date HH Agency Contacted: 01/21/21 Time [place]: [id number] Representative spoke with at [place]: [place] ([place]) Interventions    Readmission Risk Interventions No flowsheet data found.

## 2021-02-25 NOTE — Progress Notes (Signed)
[facility] PROGRESS NOTE  Rebekah Burton [id number] DOB: 04/08/1976 DOA: 01/15/2021 PCP: Trey Sailors, PA  Status:  Remains inpatient appropriate because:Unsafe d/c plan and Inpatient level of care appropriate due to severity of illness   Dispo: The patient is from: Homeless              Anticipated d/c is to: ALF vs SNF vs to be determined on 3/1 patient told [facility] staff that she did not wish to pursue skilled nursing facility and instead reported desire to be set up in a hotel.  Medicaid check not available until 3/11.  If medically stable before that date we will ask [facility] supervisor to assist with payment for motel room.              Patient currently is not medically stable to d/c.  Patient has had reemergence of left breast abscess and will be requiring close follow-up as well as oral antibiotics.  Will be undergoing an ultrasound-guided aspiration of the abscess in the left breast on 3/2 and if unsuccessful may require intraoperative debridement.   Difficult to place patient Yes   Level of care: Med-Surg  Code Status: Full Family Communication:  DVT prophylaxis: SQ Heparin Vaccination status: Unknown-Incidentally Covid positive during this admission (01/15/2021)  Foley catheter: No  HPI:  45 y.o. female with PMH significant for ESRD on HD MWF, blindness, hypertension, PAD. Patient presented to the ED on 01/15/2021 with breast abscess, underwent I&D by general surgery.  She was initially treated with IV antibiotics later completed a course with oral antibiotics.  She was also found to be incidental Covid positive. Now she is off contact precautions. 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 IR and vascular were consulted. aVF is now working.Dialysis per nephrology.   Subjective: .  Continues to report significant pain in left breast.  States surgical team is planning to  take her to the OR for I&D procedure.  Objective: Vitals:   02/24/21 2025 02/25/21 0458  BP: (!) 104/48 127/66  Pulse: 77 70  Resp: 16 16  Temp: 98.4 F (36.9 C) 98.4 F (36.9 C)  SpO2: 98% 98%    Intake/Output Summary (Last 24 hours) at 02/25/2021 [id number] Last data filed at 02/25/2021 0200 Gross per 24 hour  Intake 490 ml  Output 3000 ml  Net -2510 ml   Filed Weights   02/22/21 1145 02/24/21 0700 02/24/21 1028  Weight: 102 kg 97.1 kg 96.8 kg    Exam:  Constitutional: Awake, calm, mild distress as evidenced by ongoing left breast pain Breast: Left breast swollen, indurated/reddened and exquisitely tender Respiratory: Lungs are clear to auscultation anteriorly, respiratory effort is nonlabored and she is stable on room air Cardiovascular: Is regular, hemodynamically stable, normal heart sounds, no peripheral edema Abdomen: Soft nontender nondistended.  NPO for upcoming procedure Neurologic: CN 2-12 grossly intact except for known blindness/visual impairment. Sensation intact, DTR normal. Strength 5/5 x all 4 extremities.  Psychiatric: Alert and oriented x3 with appropriate affect   Assessment/Plan: Acute problems: Breast abscess/recurrent -s/p incision drainage done by general surgery.-Pathology without any evidence of malignancy -She was started on Unasyn, cultures grew gram-positive cocci. She was transitioned to Augmentin and has completed the course. -On 3/1 reemergent breast abscess.  General surgery consulted -3/2 US guided aspiration of complex fluid collection yielded minimal fluid returns-3/3 patient to undergo intraoperative surgical debridement as well as biopsy to rule out  underlying malignancy -3/1 was resumed on Augmentin -Noticed subtle trend of increasing WBCs without leukocytosis with peak WBCs 9.3 on 2/28, down to 7.3 on 3/1, and after antibiotics initiated down to 5.6 on 3/2.  She remains afebrile. -Patient with increasing pain so pain medications changed  from Vicodin 5-325 1-2 every 4 hours as needed to OxyIR 15 mg every 4 hours as needed plus Tylenol 325 mg every 4 hours as needed  Incidental COVID-19 -patient is asymptomatic, no evidence of pulmonary involvement.  -Off isolation.Currently on room air.  ESRD -nephrology was consulted, had issues with cannulation of aVF. IR and vascular was consulted and underwent fistulogram demonstrated pseudoaneurysm.  -AVF is now working.  -Dialysis per nephrology.  -Continue sevelamer.  Anemia of chronic disease -monitor intermittently, hemoglobin is stable at 9.1. Continue Aranesp.   Other problems:  Hyponatremia -patient on hemodialysis, nephrology following. Last Labs         Recent Labs  Lab 02/16/21 1204 02/17/21 1312 02/19/21 0837 02/22/21 0852  NA 132* 134* 135 133*     Hypertension -Blood pressure controlled on clonidine and Coreg.  Peripheral artery disease -hx of Fem-pop bypass. -Has history of left transmetatarsal amputation.    Data Reviewed: Basic Metabolic Panel: Recent Labs  Lab 02/19/21 0837 02/22/21 0852 02/24/21 0742  NA 135 133* 137  K 3.7 4.5 3.7  CL 94* 94* 100  CO2 '25 24 23  '$ GLUCOSE 121* 106* 112*  BUN 46* 67* 47*  CREATININE 11.68* 13.61* 9.23*  CALCIUM 10.7* 10.7* 9.8  PHOS 5.0* 5.3* 3.6   Liver Function Tests: Recent Labs  Lab 02/19/21 0837 02/22/21 0852 02/24/21 0742  ALBUMIN 3.2* 3.0* 3.0*   No results for input(s): LIPASE, AMYLASE in the last 168 hours. No results for input(s): AMMONIA in the last 168 hours. CBC: Recent Labs  Lab 02/19/21 0836 02/22/21 0852 02/23/21 1028 02/24/21 0742  WBC 7.9 9.3 7.3 5.6  NEUTROABS  --   --  5.8  --   HGB 9.4* 9.3* 9.8* 9.6*  HCT 30.9* 30.6* 32.2* 30.5*  MCV 98.7 98.7 99.4 98.4  PLT 200 231 247 231   Cardiac Enzymes: No results for input(s): CKTOTAL, CKMB, CKMBINDEX, TROPONINI in the last 168 hours. BNP (last 3 results) Recent Labs    01/18/21 0136 01/19/21 0030  01/20/21 0020  BNP 1,410.7* 1,122.4* 1,383.7*    ProBNP (last 3 results) No results for input(s): PROBNP in the last 8760 hours.  CBG: No results for input(s): GLUCAP in the last 168 hours.  Recent Results (from the past 240 hour(s))  Aerobic/Anaerobic Culture w Gram Stain (surgical/deep wound)     Status: None (Preliminary result)   Collection Time: 02/24/21  4:57 PM   Specimen: Abscess  Result Value Ref Range Status   Specimen Description ABSCESS BREAST  Final   Special Requests NONE  Final   Gram Stain   Final    ABUNDANT WBC PRESENT, PREDOMINANTLY PMN NO ORGANISMS SEEN Performed at [facility], [address], [place], [place] [address]    Culture PENDING  Incomplete   Report Status PENDING  Incomplete     Studies: US BREAST LTD UNI LEFT INC AXILLA  Result Date: 02/23/2021 CLINICAL DATA:  Left breast swelling EXAM: ULTRASOUND OF THE left BREAST COMPARISON:  Ultrasound 01/05/2021, CT chest 01/05/2021 FINDINGS: Targeted ultrasound of the region of breast swelling is performed. Large complex fluid collection within the retroareolar left breast and extending to the 3 and 6 o'clock positions of the  left breast 1-2 cm from nipple. Measured complex fluid collection at the 3 o'clock position 2 cm from nipple measures 6.2 x 3.9 cm. Deeper fluid collection at the 1 o'clock position measuring 5.1 x 2.4 cm. Generalized edema within the breast tissues. IMPRESSION: Breast edema with large complex fluid collection within the retroareolar, lower, and outer left breast. RECOMMENDATION: Recommend aspiration of complex fluid and or surgical consultation. Mammographic evaluation following resolution of acute symptoms should also be considered. BI-RADS CATEGORY  4: Suspicious. These results will be called to the ordering clinician or representative by the Radiologist Assistant, and communication documented in the PACS or [employer]. Electronically Signed   By: Donavan Foil M.D.   On:  02/23/2021 18:20    Scheduled Meds: . carvedilol  25 mg Oral BID WC  . Chlorhexidine Gluconate Cloth  6 each Topical Q0600  . cloNIDine  0.3 mg Oral TID  . darbepoetin (ARANESP) injection - DIALYSIS  150 mcg Intravenous Q Mon-HD  . heparin  5,000 Units Subcutaneous Q8H  . multivitamin  1 tablet Oral QHS  . senna  1 tablet Oral BID  . sevelamer carbonate  3,200 mg Oral TID WC   Continuous Infusions: . sodium chloride    . clindamycin (CLEOCIN) IV 600 mg (02/25/21 [id number])    Active Problems:   ESRD (end stage renal disease) ([place])   Breast abscess   COVID-19 virus infection   Breast abscess of female   Left breast abscess   Consultants:  Nephrology  General surgery  Interventional radiology  Procedures:  Aspiration of breast abscess per IR 3/1 with further image guided aspiration of complex breast abscess planned for 3/2  Antibiotics: Anti-infectives (From admission, onward)   Start     Dose/Rate Route Frequency Ordered Stop   02/24/21 2200  amoxicillin-clavulanate (AUGMENTIN) 500-125 MG per tablet 500 mg  Status:  Discontinued        1 tablet Oral Daily at bedtime 02/23/21 1149 02/24/21 0817   02/24/21 1200  clindamycin (CLEOCIN) capsule 300 mg  Status:  Discontinued        300 mg Oral Every 6 hours 02/24/21 0817 02/24/21 0952   02/24/21 1200  clindamycin (CLEOCIN) IVPB 600 mg        600 mg 100 mL/hr over 30 Minutes Intravenous Every 6 hours 02/24/21 0952     02/23/21 1245  amoxicillin-clavulanate (AUGMENTIN) 500-125 MG per tablet 500 mg        1 tablet Oral  Once 02/23/21 1149 02/23/21 1445   02/23/21 1215  amoxicillin-clavulanate (AUGMENTIN) 875-125 MG per tablet 1 tablet  Status:  Discontinued        1 tablet Oral Every 12 hours 02/23/21 1120 02/23/21 1149   01/23/21 1400  amoxicillin-clavulanate (AUGMENTIN) 500-125 MG per tablet 500 mg        500 mg Oral Every 24 hours 01/23/21 1124 01/25/21 1657   01/23/21 1015  amoxicillin-clavulanate (AUGMENTIN) 875-125 MG per  tablet 1 tablet  Status:  Discontinued        1 tablet Oral Every 12 hours 01/23/21 1002 01/23/21 1124   01/20/21 0845  vancomycin (VANCOCIN) 1-5 GM/200ML-% IVPB       Note to Pharmacy: Judieth Keens  : cabinet override      01/20/21 0845 01/20/21 2059   01/18/21 1200  vancomycin (VANCOCIN) IVPB 1000 mg/200 mL premix  Status:  Discontinued        1,000 mg 200 mL/hr over 60 Minutes Intravenous Every M-W-F (Hemodialysis) 01/16/21 1017 01/21/21  1324   01/17/21 2200  Ampicillin-Sulbactam (UNASYN) 3 g in sodium chloride 0.9 % 100 mL IVPB  Status:  Discontinued        3 g 200 mL/hr over 30 Minutes Intravenous Daily at bedtime 01/16/21 1058 01/23/21 1002   01/17/21 1000  remdesivir 100 mg in sodium chloride 0.9 % 100 mL IVPB       "Followed by" Linked Group Details   100 mg 200 mL/hr over 30 Minutes Intravenous Daily 01/15/21 2357 01/18/21 0918   01/16/21 1145  Ampicillin-Sulbactam (UNASYN) 3 g in sodium chloride 0.9 % 100 mL IVPB        3 g 200 mL/hr over 30 Minutes Intravenous  Once 01/16/21 1058 01/16/21 1417   01/16/21 1100  vancomycin (VANCOREADY) IVPB 1500 mg/300 mL        1,500 mg 150 mL/hr over 120 Minutes Intravenous  Once 01/16/21 1017 01/16/21 1849   01/16/21 0300  remdesivir 200 mg in sodium chloride 0.9% 250 mL IVPB       "Followed by" Linked Group Details   200 mg 580 mL/hr over 30 Minutes Intravenous Once 01/15/21 2357 01/16/21 0543   01/15/21 2345  amoxicillin-clavulanate (AUGMENTIN) 875-125 MG per tablet 1 tablet  Status:  Discontinued        1 tablet Oral Every 12 hours 01/15/21 2334 01/15/21 2357   01/15/21 1800  vancomycin (VANCOCIN) IVPB 1000 mg/200 mL premix        1,000 mg 200 mL/hr over 60 Minutes Intravenous  Once 01/15/21 1753 01/15/21 2120       Time spent: 20 minutes    Erin Hearing ANP  [facility] 7 am - 330 pm/M-F for direct patient care and secure chat Please refer to Amion for contact info 41  days

## 2021-02-25 NOTE — Op Note (Signed)
Date: 02/25/21  Patient: Rebekah Burton MRN: [id number]   Preoperative Diagnosis: Left breast abscess Postoperative Diagnosis: Same  Procedure: Incision and drainage of left breast wound, left breast biopsy  Surgeon: Michaelle Birks, MD  EBL: Minimal  Anesthesia: General LMA  Specimens: Left breast biopsy, left breast culture  Indications: Ms. [name] is a 45 yo female who previously had a left breast abscess drained about 6 weeks ago. She has developed recurrent erythema and induration, with evidence of a subareolar abscess on [place]. She underwent aspiration yesterday but only 40m of fluid was aspirated. She has had persistent pain and cellulitis and thus the decision was made to proceed with operative incision and drainage.  Findings: Erythema and induration of the left breast with no drainable abscess. Skin biopsy performed.   Procedure details: Informed consent was obtained in the preoperative area prior to the procedure. The patient was brought to the operating room and placed on the table in the supine position. General anesthesia was induced and appropriate lines and drains were placed for intraoperative monitoring. Perioperative antibiotics were administered per SCIP guidelines. The left breast was prepped and draped in the usual sterile fashion. A pre-procedure timeout was taken verifying patient identity, surgical site and procedure to be performed.  There was a small amount of drainage from the prior wound, which is still granulating, with surrounding induration. The wound was incised and opened, with drainage of a small amount of turbid fluid. There was a subareolar cavity which was probed, but there was no large collection or abscess. Aspiration was attempted from multiple other sites on the breast to identify a fluid collection, but no other drainable collections were found. An area of indurated skin on the upper inner quadrant was selected and an ellipse of skin was excised and sent for  pathology. The biopsy site was closed with simple interrupted 4-0 monocryl suture. Dressings were placed.  The patient tolerated the procedure well with no apparent complications. All counts were correct x2 at the end of the procedure. The patient was extubated and taken to PACU in stable condition.  SMichaelle Birks MD 02/25/21 1:01 PM

## 2021-02-25 NOTE — Transfer of Care (Signed)
Immediate Anesthesia Transfer of Care Note  Patient: Rebekah Burton  Procedure(s) Performed: INCISION AND DRAINAGE BREAST  ABSCESS (Left Breast)  Patient Location: PACU  Anesthesia Type:General  Level of Consciousness: drowsy and patient cooperative  Airway & Oxygen Therapy: Patient Spontanous Breathing  Post-op Assessment: Report given to RN, Post -op Vital signs reviewed and stable and Patient moving all extremities X 4  Post vital signs: Reviewed and stable  Last Vitals:  Vitals Value Taken Time  BP    Temp    Pulse    Resp    SpO2      Last Pain:  Vitals:   02/25/21 0458  TempSrc: Oral  PainSc:       Patients Stated Pain Goal: 1 ([id number])  Complications: No complications documented.   Dr. Fransisco Beau notified of BP being low, Neo drip started.

## 2021-02-25 NOTE — Progress Notes (Addendum)
Rebekah Burton Progress Note   Subjective:     Patient seen and examined today at bedside. Tolerated HD yesterday with UF 3L. She has no compliants at this time. Denies CP, SOB, ABD pain, N/V/D. She is awaiting placement.  Objective Vitals:   02/24/21 1650 02/24/21 1715 02/24/21 2025 02/25/21 0458  BP: 93/64 111/78 (!) 104/48 127/66  Pulse: 79 76 77 70  Resp: '20 18 16 16  '$ Temp:  98.2 F (36.8 C) 98.4 F (36.9 C) 98.4 F (36.9 C)  TempSrc:  Oral Oral Oral  SpO2: 100% 99% 98% 98%  Weight:      Height:       Physical Exam General:appears comfortable; no acute respiratory distress Heart:Normal S1 and S2; No murmurs, gallops, or friction rub Lungs:Clear throughout w/o wheezing, rales, or rhonchi Abdomen:soft, round non-tender Extremities:No LE edema Dialysis Access:L AVF(+) bruit/thrill   Filed Weights   02/22/21 1145 02/24/21 0700 02/24/21 1028  Weight: 102 kg 97.1 kg 96.8 kg    Intake/Output Summary (Last 24 hours) at 02/25/2021 0847 Last data filed at 02/25/2021 0200 Gross per 24 hour  Intake 490 ml  Output 3000 ml  Net -2510 ml    Additional Objective Labs: Basic Metabolic Panel: Recent Labs  Lab 02/19/21 0837 02/22/21 0852 02/24/21 0742  NA 135 133* 137  K 3.7 4.5 3.7  CL 94* 94* 100  CO2 '25 24 23  '$ GLUCOSE 121* 106* 112*  BUN 46* 67* 47*  CREATININE 11.68* 13.61* 9.23*  CALCIUM 10.7* 10.7* 9.8  PHOS 5.0* 5.3* 3.6   Liver Function Tests: Recent Labs  Lab 02/19/21 0837 02/22/21 0852 02/24/21 0742  ALBUMIN 3.2* 3.0* 3.0*   No results for input(s): LIPASE, AMYLASE in the last 168 hours. CBC: Recent Labs  Lab 02/19/21 0836 02/22/21 0852 02/23/21 1028 02/24/21 0742  WBC 7.9 9.3 7.3 5.6  NEUTROABS  --   --  5.8  --   HGB 9.4* 9.3* 9.8* 9.6*  HCT 30.9* 30.6* 32.2* 30.5*  MCV 98.7 98.7 99.4 98.4  PLT 200 231 247 231   Blood Culture    Component Value Date/Time   SDES ABSCESS BREAST 02/24/2021 1657   SPECREQUEST NONE  02/24/2021 1657   CULT PENDING 02/24/2021 1657   REPTSTATUS PENDING 02/24/2021 1657   Studies/Results: US BREAST LTD UNI LEFT INC AXILLA  Result Date: 02/23/2021 CLINICAL DATA:  Left breast swelling EXAM: ULTRASOUND OF THE left BREAST COMPARISON:  Ultrasound 01/05/2021, CT chest 01/05/2021 FINDINGS: Targeted ultrasound of the region of breast swelling is performed. Large complex fluid collection within the retroareolar left breast and extending to the 3 and 6 o'clock positions of the left breast 1-2 cm from nipple. Measured complex fluid collection at the 3 o'clock position 2 cm from nipple measures 6.2 x 3.9 cm. Deeper fluid collection at the 1 o'clock position measuring 5.1 x 2.4 cm. Generalized edema within the breast tissues. IMPRESSION: Breast edema with large complex fluid collection within the retroareolar, lower, and outer left breast. RECOMMENDATION: Recommend aspiration of complex fluid and or surgical consultation. Mammographic evaluation following resolution of acute symptoms should also be considered. BI-RADS CATEGORY  4: Suspicious. These results will be called to the ordering clinician or representative by the Radiologist Assistant, and communication documented in the PACS or [employer]. Electronically Signed   By: Donavan Foil M.D.   On: 02/23/2021 18:20    Medications: . sodium chloride    . clindamycin (CLEOCIN) IV 600 mg (02/25/21 [id number])   . carvedilol  25 mg Oral BID WC  . Chlorhexidine Gluconate Cloth  6 each Topical Q0600  . Chlorhexidine Gluconate Cloth  6 each Topical Once  . cloNIDine  0.3 mg Oral TID  . darbepoetin (ARANESP) injection - DIALYSIS  150 mcg Intravenous Q Mon-HD  . heparin  5,000 Units Subcutaneous Q8H  . multivitamin  1 tablet Oral QHS  . senna  1 tablet Oral BID  . sevelamer carbonate  3,200 mg Oral TID WC    Dialysis Orders: [place] 4h 85mn 2/2.5 bath 105kg AVF  parsabiv 15 mg three times a week with HD mircera 150 mcg every 2  weeks - last given 01/13/21 hectorol 3 mcg three times a week  Assessment/Plan:  1. ESRD: Continue HD per MWF schedule,tolerated HD UF 3L yesterday.  Scheduled for HD tomorrow 02/26/21. 2. HD vascular access: Recurrentcannulationissuesw/AVF.IR completed f'gram 01/25/21 showing significant pseudoaneurysm s/p thrombin injection and near complete thrombosis of pseudoaneurysm per IR.Cannulation issues now appear to have resolved, will continue to monitor. 3. Left breast abscess - per previous Hospitalist note, L breast U/S performed which showed complex fluid collection. L breast aspiration performed on 02/24/21; Per previous surgery note, aspirated minimal fluid. Plan for OR for I&D and Bunch Biopsy to r/o inflammatory breast cancer. Patient remains NPO. 4. Covid-19: Incidentally found, asymptomatic. Out of isolation window now. 5. HTN: BP controlled, edema resolved -  below EDW, will need to be lowered at d/c. 6. Anemiaof ESRD:Hgb9.6- stable.ContinueAranesp 1515m weekly. Tsat34%, ferritin [id number]. Metabolic bone disease- Phos improving - now at goal, continue binders- renvela.Corrected calcium high, hectorol on hold, use low Ca bath. Not able to get parsabiv here.  8. Social Issues:Difficult situation- pt homeless.Per Hospitalist note, anticipated d/c to SNF. SW involved.  CoTobie PoetNP [facility] 02/25/2021,8:47 AM  LOS: 41 days

## 2021-02-26 ENCOUNTER — Encounter (HOSPITAL_COMMUNITY): Payer: Self-pay | Admitting: Surgery

## 2021-02-26 DIAGNOSIS — N186 End stage renal disease: Secondary | ICD-10-CM | POA: Diagnosis not present

## 2021-02-26 DIAGNOSIS — N611 Abscess of the breast and nipple: Secondary | ICD-10-CM | POA: Diagnosis not present

## 2021-02-26 DIAGNOSIS — Z659 Problem related to unspecified psychosocial circumstances: Secondary | ICD-10-CM | POA: Diagnosis not present

## 2021-02-26 DIAGNOSIS — U071 COVID-19: Secondary | ICD-10-CM | POA: Diagnosis not present

## 2021-02-26 LAB — SURGICAL PATHOLOGY

## 2021-02-26 LAB — RENAL FUNCTION PANEL
Albumin: 3.4 g/dL — ABNORMAL LOW (ref 3.5–5.0)
Anion gap: 16 — ABNORMAL HIGH (ref 5–15)
BUN: 63 mg/dL — ABNORMAL HIGH (ref 6–20)
CO2: 27 mmol/L (ref 22–32)
Calcium: 10.8 mg/dL — ABNORMAL HIGH (ref 8.9–10.3)
Chloride: 90 mmol/L — ABNORMAL LOW (ref 98–111)
Creatinine, Ser: 12.94 mg/dL — ABNORMAL HIGH (ref 0.44–1.00)
GFR, Estimated: 3 mL/min — ABNORMAL LOW (ref >60)
Glucose, Bld: 190 mg/dL — ABNORMAL HIGH (ref 70–99)
Phosphorus: 4.5 mg/dL (ref 2.5–4.6)
Potassium: 4.3 mmol/L (ref 3.5–5.1)
Sodium: 133 mmol/L — ABNORMAL LOW (ref 135–145)

## 2021-02-26 MED ORDER — MORPHINE SULFATE (PF) 2 MG/ML IV SOLN
INTRAVENOUS | Status: AC
  Filled 2021-02-26: qty 1

## 2021-02-26 MED ORDER — MORPHINE SULFATE (PF) 2 MG/ML IV SOLN
2.0000 mg | Freq: Once | INTRAVENOUS | Status: AC
  Administered 2021-02-26: 2 mg via INTRAVENOUS
  Filled 2021-02-26: qty 1

## 2021-02-26 MED ORDER — MORPHINE SULFATE (PF) 2 MG/ML IV SOLN
1.0000 mg | INTRAVENOUS | Status: DC | PRN
  Administered 2021-02-26 – 2021-03-11 (×38): 2 mg via INTRAVENOUS
  Filled 2021-02-26 (×36): qty 1

## 2021-02-26 NOTE — Progress Notes (Addendum)
[facility] PROGRESS NOTE  Rebekah Burton [id number] DOB: April 22, 1976 DOA: 01/15/2021 PCP: Trey Sailors, PA  Status:  Remains inpatient appropriate because:Unsafe d/c plan and Inpatient level of care appropriate due to severity of illness   Dispo: The patient is from: Homeless              Anticipated d/c is to: ALF vs SNF vs to be determined on 3/1 patient told [facility] staff that she did not wish to pursue skilled nursing facility and instead reported desire to be set up in a hotel.  Medicaid check not available until 3/11.  If medically stable before that date we will ask [facility] supervisor to assist with payment for motel room.              Patient currently is not medically stable to d/c.  Patient has had reemergence of left breast abscess and will be requiring close follow-up as well as oral antibiotics.  Will be undergoing an ultrasound-guided aspiration of the abscess in the left breast on 3/2 and if unsuccessful may require intraoperative debridement.   Difficult to place patient Yes   Level of care: Med-Surg  Code Status: Full Family Communication:  DVT prophylaxis: SQ Heparin Vaccination status: Unknown-Incidentally Covid positive during this admission (01/15/2021)  Foley catheter: No  HPI:  45 y.o. female with PMH significant for ESRD on HD MWF, blindness, hypertension, PAD. Patient presented to the ED on 01/15/2021 with breast abscess, underwent I&D by general surgery.  She was initially treated with IV antibiotics later completed a course with oral antibiotics.  She was also found to be incidental Covid positive. Now she is off contact precautions. 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 IR and vascular were consulted. aVF is now working.Dialysis per nephrology.   Subjective: Awake.  Reporting significant pain while surgical team is removing dressings and examining  recent surgical wound.  Patient agrees to adding IV morphine for breakthrough pain.  Objective: Vitals:   02/25/21 1953 02/26/21 0406  BP: 101/62 (!) 144/83  Pulse:  82  Resp: 16 16  Temp: 98.1 F (36.7 C) 97.9 F (36.6 C)  SpO2: 99% 99%    Intake/Output Summary (Last 24 hours) at 02/26/2021 0804 Last data filed at 02/26/2021 0300 Gross per 24 hour  Intake 1340 ml  Output --  Net 1340 ml   Filed Weights   02/24/21 0700 02/24/21 1028 02/25/21 1031  Weight: 97.1 kg 96.8 kg 96.8 kg    Exam:  Constitutional: Awake, mild distress as evidenced by surgical site pain during wound care Breast: Left breast swollen, indurated/less reddened and exquisitely tender-open area on breast consistent with recent surgery and biopsy Respiratory: Lungs are clear to auscultation and she is stable on room air Cardiovascular: Heart sounds, normotensive and hemodynamically stable, no tachycardia, extremities warm to touch Abdomen: Soft nontender nondistended.  Bowel sounds present. LBM 3/1 Neurologic: CN 2-12 grossly intact except for known blindness/visual impairment. Sensation intact, DTR normal. Strength 5/5 x all 4 extremities.  Psychiatric: Alert and oriented x3, pleasant affect despite significant pain during wound care   Assessment/Plan: Acute problems: Breast abscess/recurrent -s/p incision drainage done by general surgery.-Pathology without any evidence of malignancy -She was started on Unasyn, cultures grew gram-positive cocci. She was transitioned to Augmentin and completed recommended duration of therapy -On 3/1 reemergent breast abscess.  General surgery consulted and patient was resumed on Augmentin -3/2 US guided aspiration  of complex fluid collection yielded minimal fluid returns -3/3 underwent intraoperative surgical debridement as well as biopsy to rule out underlying malignancy -Patient with increasing pain so pain medications changed from Vicodin 5-325 1-2 every 4 hours as needed  to OxyIR 15 mg every 4 hours as needed plus Tylenol 325 mg every 4 hours as needed -3/4 will give IV morphine 2 mg x 1 dose then allow for 1 to 4 mg every 2 hours as needed breakthrough pain  Incidental COVID-19 -patient is asymptomatic, no evidence of pulmonary involvement.  -Off isolation.Currently on room air.  ESRD -nephrology was consulted, had issues with cannulation of aVF. IR and vascular was consulted and underwent fistulogram demonstrated pseudoaneurysm.  -AVF is now working.  -Dialysis per nephrology.  -Continue sevelamer.  Anemia of chronic disease -monitor intermittently, hemoglobin is stable at 9.1. Continue Aranesp.   Other problems:  Hyponatremia -patient on hemodialysis, nephrology following. Last Labs         Recent Labs  Lab 02/16/21 1204 02/17/21 1312 02/19/21 0837 02/22/21 0852  NA 132* 134* 135 133*     Hypertension -Blood pressure controlled on clonidine and Coreg.  Peripheral artery disease -hx of Fem-pop bypass. -Has history of left transmetatarsal amputation.    Data Reviewed: Basic Metabolic Panel: Recent Labs  Lab 02/19/21 0837 02/22/21 0852 02/24/21 0742 02/25/21 1042  NA 135 133* 137 134*  K 3.7 4.5 3.7 4.3  CL 94* 94* 100 100  CO2 '25 24 23  '$ --   GLUCOSE 121* 106* 112* 112*  BUN 46* 67* 47* 45*  CREATININE 11.68* 13.61* 9.23* 10.50*  CALCIUM 10.7* 10.7* 9.8  --   PHOS 5.0* 5.3* 3.6  --    Liver Function Tests: Recent Labs  Lab 02/19/21 0837 02/22/21 0852 02/24/21 0742  ALBUMIN 3.2* 3.0* 3.0*   No results for input(s): LIPASE, AMYLASE in the last 168 hours. No results for input(s): AMMONIA in the last 168 hours. CBC: Recent Labs  Lab 02/19/21 0836 02/22/21 0852 02/23/21 1028 02/24/21 0742 02/25/21 1042  WBC 7.9 9.3 7.3 5.6  --   NEUTROABS  --   --  5.8  --   --   HGB 9.4* 9.3* 9.8* 9.6* 12.6  HCT 30.9* 30.6* 32.2* 30.5* 37.0  MCV 98.7 98.7 99.4 98.4  --   PLT 200 231 247 231  --    Cardiac  Enzymes: No results for input(s): CKTOTAL, CKMB, CKMBINDEX, TROPONINI in the last 168 hours. BNP (last 3 results) Recent Labs    01/18/21 0136 01/19/21 0030 01/20/21 0020  BNP 1,410.7* 1,122.4* 1,383.7*    ProBNP (last 3 results) No results for input(s): PROBNP in the last 8760 hours.  CBG: No results for input(s): GLUCAP in the last 168 hours.  Recent Results (from the past 240 hour(s))  Aerobic/Anaerobic Culture w Gram Stain (surgical/deep wound)     Status: None (Preliminary result)   Collection Time: 02/24/21  4:57 PM   Specimen: Abscess  Result Value Ref Range Status   Specimen Description ABSCESS BREAST  Final   Special Requests NONE  Final   Gram Stain   Final    ABUNDANT WBC PRESENT, PREDOMINANTLY PMN NO ORGANISMS SEEN    Culture   Final    NO GROWTH < 24 HOURS Performed at [facility], [address] [place], [place] [address]    Report Status PENDING  Incomplete  Surgical pcr screen     Status: None   Collection Time: 02/25/21  9:15  AM   Specimen: Nasal Mucosa; Nasal Swab  Result Value Ref Range Status   MRSA, PCR NEGATIVE NEGATIVE Final   Staphylococcus aureus NEGATIVE NEGATIVE Final    Comment: (NOTE) The Xpert SA Assay (FDA approved for NASAL specimens in patients 29 years of age and older), is one component of a comprehensive surveillance program. It is not intended to diagnose infection nor to guide or monitor treatment. Performed at [facility], [place] [address], [place], [place] [address]   Aerobic/Anaerobic Culture w Gram Stain (surgical/deep wound)     Status: None (Preliminary result)   Collection Time: 02/25/21 12:42 PM   Specimen: PATH Cytology Misc. fluid; Body Fluid  Result Value Ref Range Status   Specimen Description BREAST  Final   Special Requests LEFT  Final   Gram Stain   Final    ABUNDANT WBC PRESENT,BOTH PMN AND MONONUCLEAR NO ORGANISMS SEEN    Culture   Final    NO GROWTH < 24 HOURS Performed at [facility], [place] [address], [place],  [address]    Report Status PENDING  Incomplete     Studies: US BREAST ASPIRATION LEFT  Result Date: 02/25/2021 INDICATION: Left-sided breast abscess. Please perform ultrasound-guided aspiration for diagnostic and therapeutic purposes. EXAM: ULTRASOUND GUIDED BREAST CYST ASPIRATION COMPARISON:  Breast ultrasound-02/23/2021 MEDICATIONS: The patient is currently admitted to the hospital and receiving intravenous antibiotics. The antibiotics were administered within an appropriate time frame prior to the initiation of the procedure. ANESTHESIA/SEDATION: Moderate (conscious) sedation was employed during this procedure. A total of Versed 2 mg and Fentanyl 100 mcg was administered intravenously. Moderate Sedation Time: 14 minutes. The patient's level of consciousness and vital signs were monitored continuously by radiology nursing throughout the procedure under my direct supervision. CONTRAST:  None COMPLICATIONS: None immediate. PROCEDURE: Informed written consent was obtained from the patient after a discussion of the risks, benefits and alternatives to treatment. Preprocedural ultrasound scanning demonstrated grossly unchanged size and appearance of the serpiginous fluid collection subjacent to the nipple with dominant component superior to the nipple at the approximately 12 o'clock position. Note, the patient has a pre-existing draining wound inferior to the nipple. A timeout was performed prior to the initiation of the procedure. The skin overlying the superior aspect of the left breast was prepped and draped in the usual sterile fashion. The overlying soft tissues were anesthetized with 1% lidocaine with epinephrine. Under direct ultrasound guidance, a 18 gauge trocar needle was advanced into the abscess/fluid collection. Multiple ultrasound images were saved for procedural documentation purposes. Approximately 3 cc of blood tinged purulent appearing fluid was  aspirated from the collection. Next, a short Amplatz wire was coiled within the collection and the trocar needle was exchanged for a 5 [place] Yueh sheath catheter. The catheter was aspirated as it was slowly retracted however no additional fluid was able to be aspirated due to the thick nature of the fluid. At this time, manual compression was utilized to express an additional approximately 3 cc of similar appearing blood-tinged purulent fluid from the collection with the majority expressed from the pre-existing open wound inferior to the nipple. At this point, the procedure was completed. The patient tolerated the procedure well without immediate postprocedural complication. IMPRESSION: Successful US guided aspiration of approximately 3 cc of blood tinged purulent fluid from complex thick fluid collection/abscess subjacent to the nipple. Aspirated sample was sent to the laboratory as requested by the ordering clinical team. Electronically Signed   By: Jenny Reichmann  Watts M.D.   On: 02/25/2021 09:14    Scheduled Meds: . carvedilol  25 mg Oral BID WC  . Chlorhexidine Gluconate Cloth  6 each Topical Q0600  . cloNIDine  0.3 mg Oral TID  . darbepoetin (ARANESP) injection - DIALYSIS  150 mcg Intravenous Q Mon-HD  . heparin  5,000 Units Subcutaneous Q8H  . multivitamin  1 tablet Oral QHS  . senna  1 tablet Oral BID  . sevelamer carbonate  3,200 mg Oral TID WC   Continuous Infusions: . sodium chloride    . sodium chloride    . sodium chloride    . sodium chloride 10 mL/hr at 02/25/21 1221  . clindamycin (CLEOCIN) IV 600 mg (02/26/21 0524)    Active Problems:   ESRD (end stage renal disease) ([place])   Breast abscess   COVID-19 virus infection   Breast abscess of female   Left breast abscess   Poor social situation   Consultants:  Nephrology  General surgery  Interventional radiology  Procedures:  Aspiration of breast abscess per IR 3/1 with further image guided aspiration of complex breast  abscess planned for 3/2  Antibiotics: Anti-infectives (From admission, onward)   Start     Dose/Rate Route Frequency Ordered Stop   02/24/21 2200  amoxicillin-clavulanate (AUGMENTIN) 500-125 MG per tablet 500 mg  Status:  Discontinued        1 tablet Oral Daily at bedtime 02/23/21 1149 02/24/21 0817   02/24/21 1200  clindamycin (CLEOCIN) capsule 300 mg  Status:  Discontinued        300 mg Oral Every 6 hours 02/24/21 0817 02/24/21 0952   02/24/21 1200  clindamycin (CLEOCIN) IVPB 600 mg        600 mg 100 mL/hr over 30 Minutes Intravenous Every 6 hours 02/24/21 0952     02/23/21 1245  amoxicillin-clavulanate (AUGMENTIN) 500-125 MG per tablet 500 mg        1 tablet Oral  Once 02/23/21 1149 02/23/21 1445   02/23/21 1215  amoxicillin-clavulanate (AUGMENTIN) 875-125 MG per tablet 1 tablet  Status:  Discontinued        1 tablet Oral Every 12 hours 02/23/21 1120 02/23/21 1149   01/23/21 1400  amoxicillin-clavulanate (AUGMENTIN) 500-125 MG per tablet 500 mg        500 mg Oral Every 24 hours 01/23/21 1124 01/25/21 1657   01/23/21 1015  amoxicillin-clavulanate (AUGMENTIN) 875-125 MG per tablet 1 tablet  Status:  Discontinued        1 tablet Oral Every 12 hours 01/23/21 1002 01/23/21 1124   01/20/21 0845  vancomycin (VANCOCIN) 1-5 GM/200ML-% IVPB       Note to Pharmacy: Judieth Keens  : cabinet override      01/20/21 0845 01/20/21 2059   01/18/21 1200  vancomycin (VANCOCIN) IVPB 1000 mg/200 mL premix  Status:  Discontinued        1,000 mg 200 mL/hr over 60 Minutes Intravenous Every M-W-F (Hemodialysis) 01/16/21 1017 01/21/21 1324   01/17/21 2200  Ampicillin-Sulbactam (UNASYN) 3 g in sodium chloride 0.9 % 100 mL IVPB  Status:  Discontinued        3 g 200 mL/hr over 30 Minutes Intravenous Daily at bedtime 01/16/21 1058 01/23/21 1002   01/17/21 1000  remdesivir 100 mg in sodium chloride 0.9 % 100 mL IVPB       "Followed by" Linked Group Details   100 mg 200 mL/hr over 30 Minutes Intravenous  Daily 01/15/21 2357 01/18/21 0918   01/16/21 1145  Ampicillin-Sulbactam (UNASYN) 3 g in sodium chloride 0.9 % 100 mL IVPB        3 g 200 mL/hr over 30 Minutes Intravenous  Once 01/16/21 1058 01/16/21 1417   01/16/21 1100  vancomycin (VANCOREADY) IVPB 1500 mg/300 mL        1,500 mg 150 mL/hr over 120 Minutes Intravenous  Once 01/16/21 1017 01/16/21 1849   01/16/21 0300  remdesivir 200 mg in sodium chloride 0.9% 250 mL IVPB       "Followed by" Linked Group Details   200 mg 580 mL/hr over 30 Minutes Intravenous Once 01/15/21 2357 01/16/21 0543   01/15/21 2345  amoxicillin-clavulanate (AUGMENTIN) 875-125 MG per tablet 1 tablet  Status:  Discontinued        1 tablet Oral Every 12 hours 01/15/21 2334 01/15/21 2357   01/15/21 1800  vancomycin (VANCOCIN) IVPB 1000 mg/200 mL premix        1,000 mg 200 mL/hr over 60 Minutes Intravenous  Once 01/15/21 1753 01/15/21 2120       Time spent: 20 minutes    Erin Hearing ANP  [facility] 7 am - 330 pm/M-F for direct patient care and secure chat Please refer to Amion for contact info 42  days

## 2021-02-26 NOTE — Anesthesia Postprocedure Evaluation (Signed)
Anesthesia Post Note  Patient: Rebekah Burton  Procedure(s) Performed: INCISION AND DRAINAGE BREAST  ABSCESS (Left Breast)     Patient location during evaluation: PACU Anesthesia Type: General Level of consciousness: awake and alert Pain management: pain level controlled Vital Signs Assessment: post-procedure vital signs reviewed and stable Respiratory status: spontaneous breathing, nonlabored ventilation and respiratory function stable Cardiovascular status: blood pressure returned to baseline and stable Postop Assessment: no apparent nausea or vomiting Anesthetic complications: no   No complications documented.  Last Vitals:  Vitals:   02/25/21 1953 02/26/21 0406  BP: 101/62 (!) 144/83  Pulse:  82  Resp: 16 16  Temp: 36.7 C 36.6 C  SpO2: 99% 99%    Last Pain:  Vitals:   02/26/21 0443  TempSrc:   PainSc: [name]

## 2021-02-26 NOTE — Progress Notes (Addendum)
Subjective: Complains of some breast discomfort, for dialysis today  Objective Vital signs in last 24 hours: Vitals:   02/25/21 1726 02/25/21 1953 02/26/21 0406 02/26/21 0933  BP: 106/68 101/62 (!) 144/83 (!) 146/85  Pulse: 85  82 81  Resp: '15 16 16 19  '$ Temp: 97.6 F (36.4 C) 98.1 F (36.7 C) 97.9 F (36.6 C) 98.1 F (36.7 C)  TempSrc:  Oral Oral   SpO2: 97% 99% 99% 100%  Weight:      Height:       Weight change: 0 kg  Physical Exam: General: Alert obese female NAD  Heart: RRR, no MRG Lungs: CTA, nonlabored Abdomen: Obese, BS+, NTND Extremities: No pedal edema Dialysis Access: LUA AVF + bruit    Dialysis Orders: [place] 4h 26mn 2/2.5 bath 105kg AVF  parsabiv 15 mg three times a week with HD mircera 150 mcg every 2 weeks - last given 01/13/21 hectorol 3 mcg three times a week  Problem/Plan  1. ESRD: Continue HD  MWF schedule Scheduled for HD today 02/26/21. 2. HD vascular access: Recurrentcannulationissuesw/AVF.IR completed f'gram 01/25/21 showing significant pseudoaneurysm s/p thrombin injection and near complete thrombosis of pseudoaneurysm per IR.Cannulation issues now appear to have resolved, will continue to monitor. 3. Left breast abscess -admit team and CCS managing, status post I&D abscess 01/15/2021 and 02/25/21 pathology was negative for malignancy on 01/21 "although some concern for possible inflammatory breast cancer" repeat biopsy done 03/03.  Now on oral clindamycin  4. Covid-19: Incidentally found, asymptomatic. Out of isolation window now. 5. HTN: BP controlled, edema resolved - below EDW, will need to be lowered at d/c. 6. Anemiaof ESRD:Hgb12.6 02/25/21 was onAranesp 1524m weekly.  Note will be held by pharmacy with high Hgb  (probably an error), also follow-up biopsy for CA concern Tsat34%, ferritin [id number]. Metabolic bone disease- Phos 3.6-  binders- renvela.Corrected calcium high, hectorol on hold, use low Ca bath. .   8. Social Issues:Difficult situation- pt homeless.Per Hospitalist note, anticipated d/c to SNF. SW involved.  DaErnest HaberPA-C [facility] [phone]/03/2021,10:39 AM  LOS: 42 days    Patient seen and examined, agree with above note with above modifications. Very stable HD patient difficult to place-  S/p another breast procedure yesterday so sore-  Continue with routine HD and appropriate titration of dialysis related meds.  Suspect that hgb of 12 was error and we will need to resume ESA  KeCorliss ParishMD 02/26/2021     Labs: Basic Metabolic Panel: Recent Labs  Lab 02/22/21 0852 02/24/21 0742 02/25/21 1042  NA 133* 137 134*  K 4.5 3.7 4.3  CL 94* 100 100  CO2 24 23  --   GLUCOSE 106* 112* 112*  BUN 67* 47* 45*  CREATININE 13.61* 9.23* 10.50*  CALCIUM 10.7* 9.8  --   PHOS 5.3* 3.6  --    Liver Function Tests: Recent Labs  Lab 02/22/21 0852 02/24/21 0742  ALBUMIN 3.0* 3.0*   No results for input(s): LIPASE, AMYLASE in the last 168 hours. No results for input(s): AMMONIA in the last 168 hours. CBC: Recent Labs  Lab 02/22/21 0852 02/23/21 1028 02/24/21 0742 02/25/21 1042  WBC 9.3 7.3 5.6  --   NEUTROABS  --  5.8  --   --   HGB 9.3* 9.8* 9.6* 12.6  HCT 30.6* 32.2* 30.5* 37.0  MCV 98.7 99.4 98.4  --   PLT 231 247 231  --    Cardiac Enzymes: No results for input(s):  CKTOTAL, CKMB, CKMBINDEX, TROPONINI in the last 168 hours. CBG: No results for input(s): GLUCAP in the last 168 hours.  Studies/Results: US BREAST ASPIRATION LEFT  Result Date: 02/25/2021 INDICATION: Left-sided breast abscess. Please perform ultrasound-guided aspiration for diagnostic and therapeutic purposes. EXAM: ULTRASOUND GUIDED BREAST CYST ASPIRATION COMPARISON:  Breast ultrasound-02/23/2021 MEDICATIONS: The patient is currently admitted to the hospital and receiving intravenous antibiotics. The antibiotics were administered within an appropriate time frame  prior to the initiation of the procedure. ANESTHESIA/SEDATION: Moderate (conscious) sedation was employed during this procedure. A total of Versed 2 mg and Fentanyl 100 mcg was administered intravenously. Moderate Sedation Time: 14 minutes. The patient's level of consciousness and vital signs were monitored continuously by radiology nursing throughout the procedure under my direct supervision. CONTRAST:  None COMPLICATIONS: None immediate. PROCEDURE: Informed written consent was obtained from the patient after a discussion of the risks, benefits and alternatives to treatment. Preprocedural ultrasound scanning demonstrated grossly unchanged size and appearance of the serpiginous fluid collection subjacent to the nipple with dominant component superior to the nipple at the approximately 12 o'clock position. Note, the patient has a pre-existing draining wound inferior to the nipple. A timeout was performed prior to the initiation of the procedure. The skin overlying the superior aspect of the left breast was prepped and draped in the usual sterile fashion. The overlying soft tissues were anesthetized with 1% lidocaine with epinephrine. Under direct ultrasound guidance, a 18 gauge trocar needle was advanced into the abscess/fluid collection. Multiple ultrasound images were saved for procedural documentation purposes. Approximately 3 cc of blood tinged purulent appearing fluid was aspirated from the collection. Next, a short Amplatz wire was coiled within the collection and the trocar needle was exchanged for a 5 [place] Yueh sheath catheter. The catheter was aspirated as it was slowly retracted however no additional fluid was able to be aspirated due to the thick nature of the fluid. At this time, manual compression was utilized to express an additional approximately 3 cc of similar appearing blood-tinged purulent fluid from the collection with the majority expressed from the pre-existing open wound inferior to the  nipple. At this point, the procedure was completed. The patient tolerated the procedure well without immediate postprocedural complication. IMPRESSION: Successful US guided aspiration of approximately 3 cc of blood tinged purulent fluid from complex thick fluid collection/abscess subjacent to the nipple. Aspirated sample was sent to the laboratory as requested by the ordering clinical team. Electronically Signed   By: Sandi Mariscal M.D.   On: 02/25/2021 09:14   Medications: . sodium chloride    . sodium chloride    . sodium chloride    . sodium chloride 10 mL/hr at 02/25/21 1221  . clindamycin (CLEOCIN) IV Stopped (02/26/21 0601)   . carvedilol  25 mg Oral BID WC  . Chlorhexidine Gluconate Cloth  6 each Topical Q0600  . cloNIDine  0.3 mg Oral TID  . darbepoetin (ARANESP) injection - DIALYSIS  150 mcg Intravenous Q Mon-HD  . heparin  5,000 Units Subcutaneous Q8H  . multivitamin  1 tablet Oral QHS  . senna  1 tablet Oral BID  . sevelamer carbonate  3,200 mg Oral TID WC

## 2021-02-26 NOTE — Progress Notes (Signed)
1 Day Post-Op  Subjective: CC: Patient reports pain of the left breast. She is unsure if this is better then prior to surgery. She is tolerating a diet without n/v.   Objective: Vital signs in last 24 hours: Temp:  [97.3 F (36.3 C)-98.1 F (36.7 C)] 97.9 F (36.6 C) (03/04 0406) Pulse Rate:  [65-85] 82 (03/04 0406) Resp:  [9-18] 16 (03/04 0406) BP: (79-144)/(53-83) 144/83 (03/04 0406) SpO2:  [97 %-100 %] 99 % (03/04 0406) FiO2 (%):  [21 %] 21 % (03/03 1313) Weight:  [96.8 kg] 96.8 kg (03/03 1031) Last BM Date: 02/23/21  Intake/Output from previous day: 03/03 0701 - 03/04 0700 In: [id number] [P.O.:1040; I.V.:250; IV Piggyback:50] Out: -  Intake/Output this shift: No intake/output data recorded.  PE: Left Breast: Chaperone present, RN - Candice. Patient with left breast induration and faint blanchable erythema extending along the superior aspect of the breast around 10 o'clock to the 3 o'clock position (superior-medial to superior lateral). No fluctuance. No drainage. Wound at 5 o'clock position (inferior lateral) as noted below with granulation tissue at the base. No drainage. Incision along medial aspect of the breast c/d/i with sutures in place.      Lab Results:  Recent Labs    02/23/21 1028 02/24/21 0742 02/25/21 1042  WBC 7.3 5.6  --   HGB 9.8* 9.6* 12.6  HCT 32.2* 30.5* 37.0  PLT 247 231  --    BMET Recent Labs    02/24/21 0742 02/25/21 1042  NA 137 134*  K 3.7 4.3  CL 100 100  CO2 23  --   GLUCOSE 112* 112*  BUN 47* 45*  CREATININE 9.23* 10.50*  CALCIUM 9.8  --    PT/INR No results for input(s): LABPROT, INR in the last 72 hours. CMP     Component Value Date/Time   NA 134 (L) 02/25/2021 1042   K 4.3 02/25/2021 1042   CL 100 02/25/2021 1042   CO2 23 02/24/2021 0742   GLUCOSE 112 (H) 02/25/2021 1042   BUN 45 (H) 02/25/2021 1042   CREATININE 10.50 (H) 02/25/2021 1042   CALCIUM 9.8 02/24/2021 0742   PROT 6.0 (L) 01/23/2021 0426   ALBUMIN 3.0  (L) 02/24/2021 0742   AST 46 (H) 01/23/2021 0426   ALT 35 01/23/2021 0426   ALKPHOS 42 01/23/2021 0426   BILITOT 0.7 01/23/2021 0426   GFRNONAA 5 (L) 02/24/2021 0742   GFRAA 5 (L) 02/13/2019 0734   Lipase     Component Value Date/Time   LIPASE 33 07/17/2018 1145       Studies/Results: US BREAST ASPIRATION LEFT  Result Date: 02/25/2021 INDICATION: Left-sided breast abscess. Please perform ultrasound-guided aspiration for diagnostic and therapeutic purposes. EXAM: ULTRASOUND GUIDED BREAST CYST ASPIRATION COMPARISON:  Breast ultrasound-02/23/2021 MEDICATIONS: The patient is currently admitted to the hospital and receiving intravenous antibiotics. The antibiotics were administered within an appropriate time frame prior to the initiation of the procedure. ANESTHESIA/SEDATION: Moderate (conscious) sedation was employed during this procedure. A total of Versed 2 mg and Fentanyl 100 mcg was administered intravenously. Moderate Sedation Time: 14 minutes. The patient's level of consciousness and vital signs were monitored continuously by radiology nursing throughout the procedure under my direct supervision. CONTRAST:  None COMPLICATIONS: None immediate. PROCEDURE: Informed written consent was obtained from the patient after a discussion of the risks, benefits and alternatives to treatment. Preprocedural ultrasound scanning demonstrated grossly unchanged size and appearance of the serpiginous fluid collection subjacent to the nipple with dominant component  superior to the nipple at the approximately 12 o'clock position. Note, the patient has a pre-existing draining wound inferior to the nipple. A timeout was performed prior to the initiation of the procedure. The skin overlying the superior aspect of the left breast was prepped and draped in the usual sterile fashion. The overlying soft tissues were anesthetized with 1% lidocaine with epinephrine. Under direct ultrasound guidance, a 18 gauge trocar needle  was advanced into the abscess/fluid collection. Multiple ultrasound images were saved for procedural documentation purposes. Approximately 3 cc of blood tinged purulent appearing fluid was aspirated from the collection. Next, a short Amplatz wire was coiled within the collection and the trocar needle was exchanged for a 5 [place] Yueh sheath catheter. The catheter was aspirated as it was slowly retracted however no additional fluid was able to be aspirated due to the thick nature of the fluid. At this time, manual compression was utilized to express an additional approximately 3 cc of similar appearing blood-tinged purulent fluid from the collection with the majority expressed from the pre-existing open wound inferior to the nipple. At this point, the procedure was completed. The patient tolerated the procedure well without immediate postprocedural complication. IMPRESSION: Successful US guided aspiration of approximately 3 cc of blood tinged purulent fluid from complex thick fluid collection/abscess subjacent to the nipple. Aspirated sample was sent to the laboratory as requested by the ordering clinical team. Electronically Signed   By: Sandi Mariscal M.D.   On: 02/25/2021 09:14    Anti-infectives: Anti-infectives (From admission, onward)   Start     Dose/Rate Route Frequency Ordered Stop   02/24/21 2200  amoxicillin-clavulanate (AUGMENTIN) 500-125 MG per tablet 500 mg  Status:  Discontinued        1 tablet Oral Daily at bedtime 02/23/21 1149 02/24/21 0817   02/24/21 1200  clindamycin (CLEOCIN) capsule 300 mg  Status:  Discontinued        300 mg Oral Every 6 hours 02/24/21 0817 02/24/21 0952   02/24/21 1200  clindamycin (CLEOCIN) IVPB 600 mg        600 mg 100 mL/hr over 30 Minutes Intravenous Every 6 hours 02/24/21 0952     02/23/21 1245  amoxicillin-clavulanate (AUGMENTIN) 500-125 MG per tablet 500 mg        1 tablet Oral  Once 02/23/21 1149 02/23/21 1445   02/23/21 1215  amoxicillin-clavulanate  (AUGMENTIN) 875-125 MG per tablet 1 tablet  Status:  Discontinued        1 tablet Oral Every 12 hours 02/23/21 1120 02/23/21 1149   01/23/21 1400  amoxicillin-clavulanate (AUGMENTIN) 500-125 MG per tablet 500 mg        500 mg Oral Every 24 hours 01/23/21 1124 01/25/21 1657   01/23/21 1015  amoxicillin-clavulanate (AUGMENTIN) 875-125 MG per tablet 1 tablet  Status:  Discontinued        1 tablet Oral Every 12 hours 01/23/21 1002 01/23/21 1124   01/20/21 0845  vancomycin (VANCOCIN) 1-5 GM/200ML-% IVPB       Note to Pharmacy: Judieth Keens  : cabinet override      01/20/21 0845 01/20/21 2059   01/18/21 1200  vancomycin (VANCOCIN) IVPB 1000 mg/200 mL premix  Status:  Discontinued        1,000 mg 200 mL/hr over 60 Minutes Intravenous Every M-W-F (Hemodialysis) 01/16/21 1017 01/21/21 1324   01/17/21 2200  Ampicillin-Sulbactam (UNASYN) 3 g in sodium chloride 0.9 % 100 mL IVPB  Status:  Discontinued  3 g 200 mL/hr over 30 Minutes Intravenous Daily at bedtime 01/16/21 1058 01/23/21 1002   01/17/21 1000  remdesivir 100 mg in sodium chloride 0.9 % 100 mL IVPB       "Followed by" Linked Group Details   100 mg 200 mL/hr over 30 Minutes Intravenous Daily 01/15/21 2357 01/18/21 0918   01/16/21 1145  Ampicillin-Sulbactam (UNASYN) 3 g in sodium chloride 0.9 % 100 mL IVPB        3 g 200 mL/hr over 30 Minutes Intravenous  Once 01/16/21 1058 01/16/21 1417   01/16/21 1100  vancomycin (VANCOREADY) IVPB 1500 mg/300 mL        1,500 mg 150 mL/hr over 120 Minutes Intravenous  Once 01/16/21 1017 01/16/21 1849   01/16/21 0300  remdesivir 200 mg in sodium chloride 0.9% 250 mL IVPB       "Followed by" Linked Group Details   200 mg 580 mL/hr over 30 Minutes Intravenous Once 01/15/21 2357 01/16/21 0543   01/15/21 2345  amoxicillin-clavulanate (AUGMENTIN) 875-125 MG per tablet 1 tablet  Status:  Discontinued        1 tablet Oral Every 12 hours 01/15/21 2334 01/15/21 2357   01/15/21 1800  vancomycin  (VANCOCIN) IVPB 1000 mg/200 mL premix        1,000 mg 200 mL/hr over 60 Minutes Intravenous  Once 01/15/21 1753 01/15/21 2120       Assessment/Plan ESRD - M/W/F PAD Blind Chronic anemia Obesity BMI 40.76  Recurrent Left breast abscess S/pIncision and drainage of left breast abscess with excisional debridement- 01/15/21 -  Dr. Zenia Resides - POD #11 S/p IR US guided aspiration - Dr. Pascal Lux- 02/24/21 - yielding 3cc of purulent appearing thick fluid. Cx's pending and NGTD.  S/p Incision and drainage of left breast wound, left breast biopsy - Dr. Zenia Resides - 02/25/21 - POD #1 - path was negative for malignancy 1/21, although still some concern for possible inflammatory breast cancer. Repeat bx done on 3/3 and pending. Will benefit from a diagnostic mammogram   - Cx's pending. Okay to switch from IV Clinda to PO. Labs in AM.   ID -IV Clinda. Okay to switch to oral Clinda FEN -NPO  VTE -SCDs, sq heparin   LOS: 42 days    Jillyn Ledger , [facility] Surgery 02/26/2021, 9:27 AM Please see Amion for pager number during day hours 7:00am-4:30pm

## 2021-02-27 DIAGNOSIS — N611 Abscess of the breast and nipple: Secondary | ICD-10-CM | POA: Diagnosis not present

## 2021-02-27 DIAGNOSIS — U071 COVID-19: Secondary | ICD-10-CM | POA: Diagnosis not present

## 2021-02-27 DIAGNOSIS — N186 End stage renal disease: Secondary | ICD-10-CM | POA: Diagnosis not present

## 2021-02-27 LAB — CBC
HCT: 32.3 % — ABNORMAL LOW (ref 36.0–46.0)
HCT: 33.1 % — ABNORMAL LOW (ref 36.0–46.0)
Hemoglobin: 10 g/dL — ABNORMAL LOW (ref 12.0–15.0)
Hemoglobin: 10.1 g/dL — ABNORMAL LOW (ref 12.0–15.0)
MCH: 30.5 pg (ref 26.0–34.0)
MCH: 30.4 pg (ref 26.0–34.0)
MCHC: 31 g/dL (ref 30.0–36.0)
MCHC: 30.5 g/dL (ref 30.0–36.0)
MCV: 98.5 fL (ref 80.0–100.0)
MCV: 99.7 fL (ref 80.0–100.0)
Platelets: 277 10*3/uL (ref 150–400)
Platelets: 249 10*3/uL (ref 150–400)
RBC: 3.28 MIL/uL — ABNORMAL LOW (ref 3.87–5.11)
RBC: 3.32 MIL/uL — ABNORMAL LOW (ref 3.87–5.11)
RDW: 17.8 % — ABNORMAL HIGH (ref 11.5–15.5)
RDW: 18.1 % — ABNORMAL HIGH (ref 11.5–15.5)
WBC: 9.6 10*3/uL (ref 4.0–10.5)
WBC: 6.6 10*3/uL (ref 4.0–10.5)
nRBC: 0 % (ref 0.0–0.2)
nRBC: 0 % (ref 0.0–0.2)

## 2021-02-27 MED ORDER — CLINDAMYCIN HCL 300 MG PO CAPS
300.0000 mg | ORAL_CAPSULE | Freq: Three times a day (TID) | ORAL | Status: AC
  Administered 2021-02-27 – 2021-03-03 (×12): 300 mg via ORAL
  Filled 2021-02-27 (×14): qty 1

## 2021-02-27 NOTE — Progress Notes (Signed)
2 Days Post-Op  Subjective: Reports continued left breast pain. Biopsy results negative for malignancy.   Objective: Vital signs in last 24 hours: Temp:  [97.9 F (36.6 C)-98.5 F (36.9 C)] 97.9 F (36.6 C) (03/05 0900) Pulse Rate:  [52-87] 64 (03/05 0900) Resp:  [16-18] 16 (03/05 0900) BP: (109-152)/(56-91) 124/80 (03/05 0900) SpO2:  [94 %-100 %] 100 % (03/05 0900) Weight:  [99 kg] 99 kg (03/04 1640) Last BM Date: 02/26/21  Intake/Output from previous day: 03/04 0701 - 03/05 0700 In: 1270 [P.O.:920; IV Piggyback:350] Out: 1850  Intake/Output this shift: Total I/O In: 480 [P.O.:480] Out: 0   PE: General: resting comfortably, NAD Neuro: alert and oriented Resp: normal work of breathing Breast: left breast remains indurated with tenderness to palpation. Biopsy site clean and dry. I&D site with minimal serous drainage.  Lab Results:  Recent Labs    02/26/21 1408 02/27/21 0501  WBC 9.6 6.6  HGB 10.0* 10.1*  HCT 32.3* 33.1*  PLT 277 249   BMET Recent Labs    02/25/21 1042 02/26/21 1420  NA 134* 133*  K 4.3 4.3  CL 100 90*  CO2  --  27  GLUCOSE 112* 190*  BUN 45* 63*  CREATININE 10.50* 12.94*  CALCIUM  --  10.8*   PT/INR No results for input(s): LABPROT, INR in the last 72 hours. CMP     Component Value Date/Time   NA 133 (L) 02/26/2021 1420   K 4.3 02/26/2021 1420   CL 90 (L) 02/26/2021 1420   CO2 27 02/26/2021 1420   GLUCOSE 190 (H) 02/26/2021 1420   BUN 63 (H) 02/26/2021 1420   CREATININE 12.94 (H) 02/26/2021 1420   CALCIUM 10.8 (H) 02/26/2021 1420   PROT 6.0 (L) 01/23/2021 0426   ALBUMIN 3.4 (L) 02/26/2021 1420   AST 46 (H) 01/23/2021 0426   ALT 35 01/23/2021 0426   ALKPHOS 42 01/23/2021 0426   BILITOT 0.7 01/23/2021 0426   GFRNONAA 3 (L) 02/26/2021 1420   GFRAA 5 (L) 02/13/2019 0734   Lipase     Component Value Date/Time   LIPASE 33 07/17/2018 1145       Studies/Results: No results  found.  Anti-infectives: Anti-infectives (From admission, onward)   Start     Dose/Rate Route Frequency Ordered Stop   02/24/21 2200  amoxicillin-clavulanate (AUGMENTIN) 500-125 MG per tablet 500 mg  Status:  Discontinued        1 tablet Oral Daily at bedtime 02/23/21 1149 02/24/21 0817   02/24/21 1200  clindamycin (CLEOCIN) capsule 300 mg  Status:  Discontinued        300 mg Oral Every 6 hours 02/24/21 0817 02/24/21 0952   02/24/21 1200  clindamycin (CLEOCIN) IVPB 600 mg        600 mg 100 mL/hr over 30 Minutes Intravenous Every 6 hours 02/24/21 0952     02/23/21 1245  amoxicillin-clavulanate (AUGMENTIN) 500-125 MG per tablet 500 mg        1 tablet Oral  Once 02/23/21 1149 02/23/21 1445   02/23/21 1215  amoxicillin-clavulanate (AUGMENTIN) 875-125 MG per tablet 1 tablet  Status:  Discontinued        1 tablet Oral Every 12 hours 02/23/21 1120 02/23/21 1149   01/23/21 1400  amoxicillin-clavulanate (AUGMENTIN) 500-125 MG per tablet 500 mg        500 mg Oral Every 24 hours 01/23/21 1124 01/25/21 1657   01/23/21 1015  amoxicillin-clavulanate (AUGMENTIN) 875-125 MG per tablet 1 tablet  Status:  Discontinued        1 tablet Oral Every 12 hours 01/23/21 1002 01/23/21 1124   01/20/21 0845  vancomycin (VANCOCIN) 1-5 GM/200ML-% IVPB       Note to Pharmacy: Judieth Keens  : cabinet override      01/20/21 0845 01/20/21 2059   01/18/21 1200  vancomycin (VANCOCIN) IVPB 1000 mg/200 mL premix  Status:  Discontinued        1,000 mg 200 mL/hr over 60 Minutes Intravenous Every M-W-F (Hemodialysis) 01/16/21 1017 01/21/21 1324   01/17/21 2200  Ampicillin-Sulbactam (UNASYN) 3 g in sodium chloride 0.9 % 100 mL IVPB  Status:  Discontinued        3 g 200 mL/hr over 30 Minutes Intravenous Daily at bedtime 01/16/21 1058 01/23/21 1002   01/17/21 1000  remdesivir 100 mg in sodium chloride 0.9 % 100 mL IVPB       "Followed by" Linked Group Details   100 mg 200 mL/hr over 30 Minutes Intravenous Daily 01/15/21  2357 01/18/21 0918   01/16/21 1145  Ampicillin-Sulbactam (UNASYN) 3 g in sodium chloride 0.9 % 100 mL IVPB        3 g 200 mL/hr over 30 Minutes Intravenous  Once 01/16/21 1058 01/16/21 1417   01/16/21 1100  vancomycin (VANCOREADY) IVPB 1500 mg/300 mL        1,500 mg 150 mL/hr over 120 Minutes Intravenous  Once 01/16/21 1017 01/16/21 1849   01/16/21 0300  remdesivir 200 mg in sodium chloride 0.9% 250 mL IVPB       "Followed by" Linked Group Details   200 mg 580 mL/hr over 30 Minutes Intravenous Once 01/15/21 2357 01/16/21 0543   01/15/21 2345  amoxicillin-clavulanate (AUGMENTIN) 875-125 MG per tablet 1 tablet  Status:  Discontinued        1 tablet Oral Every 12 hours 01/15/21 2334 01/15/21 2357   01/15/21 1800  vancomycin (VANCOCIN) IVPB 1000 mg/200 mL premix        1,000 mg 200 mL/hr over 60 Minutes Intravenous  Once 01/15/21 1753 01/15/21 2120       Assessment/Plan 45 yo female with left breast abscess s/p I&D 01/15/21, with recurrent erythema/induration. Biopsies have not shown malignancy, however the patient will require a diagnostic mammogram to rule out an underlying breast cancer. Another possibility is granulomatous mastitis, but malignancy must be ruled out. Unfortunately mammogram cannot be done as an inpatient, so this will need to be set up at the breast imaging center when patient is discharged. Continue antibiotics for now, switch to oral, will plan to complete 7-day course from date of I&D. Surgery will continue to follow.   LOS: 74 days    Michaelle Birks, MD [facility] Surgery General, Hepatobiliary and Pancreatic Surgery 02/27/21 4:23 PM

## 2021-02-27 NOTE — Progress Notes (Addendum)
Subjective: No complaints this morning, noted HD yesterday with  Vomiting end of HD,  ate  meal before has not done it before.  No problems with meals last night or this morning  Objective Vital signs in last 24 hours: Vitals:   02/26/21 1747 02/26/21 2105 02/27/21 0514 02/27/21 0900  BP: (!) 152/91 129/74 (!) 109/56 124/80  Pulse: 76 87 66 64  Resp: '18 18 18 16  '$ Temp: 98.3 F (36.8 C) 98.5 F (36.9 C) 98.2 F (36.8 C) 97.9 F (36.6 C)  TempSrc:   Oral Oral  SpO2: 100% 94% 98% 100%  Weight:      Height:       Weight change: 3.7 kg  Physical Exam: General: Alert obese female NAD  Heart: RRR, no MRG Lungs: CTA, nonlabored Abdomen: Obese, BS+, NTND Extremities: No pedal edema Dialysis Access: LUA AVF + bruit    Dialysis Orders: [facility] 4h 3mn 2/2.5 bath 105kg AVF  parsabiv 15 mg three times a week with HD mircera 150 mcg every 2 weeks - last given 01/13/21 hectorol 3 mcg three times a week  Problem/Plan  1. ESRD: Continue HD  MWF scheduleScheduled for HD  next on Monday 3/07 2. HD vascular access: Recurrentcannulationissuesw/AVF.IR completed f'gram 01/25/21 showing significant pseudoaneurysm s/p thrombin injection and near complete thrombosis of pseudoaneurysm per IR.Cannulation issues now appear to have resolved, will continue to monitor. 3. Left breast abscess -admit team and CCS managing, status post I&D abscess 01/15/2021 and 02/25/21 pathology was negative for malignancy on 01/21 "although some concern for possible inflammatory breast cancer" repeat biopsy done 03/03.  Now on oral clindamycin  4. Covid-19: Incidentally found, asymptomatic. Out of isolation window now. 5. HTN: BP controlled, edema resolved - below EDW, will need to be lowered at d/c.  3 L UF yesterday, had vomiting end of HD and but probably related to eating meal before dialysis never had done that before 6. Anemiaof ESRD:Hgb10.1 this a.m. was onAranesp 156m weekly.  Note  will be held by pharmacy with high Hgb  - isolated value over 12 , now 10, also follow-up biopsy for CA concern Tsat34%, ferritin [id number]. Metabolic bone disease- Phos 3.6-  binders- renvela.Corrected calcium high, hectorol on hold, use low Ca bath. .  8. Social Issues:Difficult situation- pt homeless.Per Hospitalist note, anticipated d/c to SNF. SW involved   Pt is stable so will not be seen on Sunday- Will revisit on Monday. Call with any acute issues tomorrow    DaErnest HaberPA-C [id number] [id number] 3793/04/2021,11:06 AM  LOS: 43 days    Patient seen and examined, agree with above note with above modifications. Doing well- did have emesis with HD yest-  Probably combo of big goal and eating before-  Is fine now.  Plan for next routine HD on Monday and appropriate titration of dialysis related meds  KeCorliss ParishMD 02/27/2021     Labs: Basic Metabolic Panel: Recent Labs  Lab 02/22/21 0852 02/24/21 0742 02/25/21 1042 02/26/21 1420  NA 133* 137 134* 133*  K 4.5 3.7 4.3 4.3  CL 94* 100 100 90*  CO2 24 23  --  27  GLUCOSE 106* 112* 112* 190*  BUN 67* 47* 45* 63*  CREATININE 13.61* 9.23* 10.50* 12.94*  CALCIUM 10.7* 9.8  --  10.8*  PHOS 5.3* 3.6  --  4.5   Liver Function Tests: Recent Labs  Lab 02/22/21 0852 02/24/21 0742 02/26/21 1420  ALBUMIN 3.0* 3.0* 3.4*   No  results for input(s): LIPASE, AMYLASE in the last 168 hours. No results for input(s): AMMONIA in the last 168 hours. CBC: Recent Labs  Lab 02/22/21 0852 02/23/21 1028 02/24/21 0742 02/25/21 1042 02/26/21 1408 02/27/21 0501  WBC 9.3 7.3 5.6  --  9.6 6.6  NEUTROABS  --  5.8  --   --   --   --   HGB 9.3* 9.8* 9.6* 12.6 10.0* 10.1*  HCT 30.6* 32.2* 30.5* 37.0 32.3* 33.1*  MCV 98.7 99.4 98.4  --  98.5 99.7  PLT 231 247 231  --  277 249   Cardiac Enzymes: No results for input(s): CKTOTAL, CKMB, CKMBINDEX, TROPONINI in the last 168 hours. CBG: No results for  input(s): GLUCAP in the last 168 hours.  Studies/Results: No results found. Medications: . sodium chloride    . sodium chloride 10 mL/hr at 02/27/21 0831  . clindamycin (CLEOCIN) IV 600 mg (02/27/21 0848)   . carvedilol  25 mg Oral BID WC  . Chlorhexidine Gluconate Cloth  6 each Topical Q0600  . cloNIDine  0.3 mg Oral TID  . darbepoetin (ARANESP) injection - DIALYSIS  150 mcg Intravenous Q Mon-HD  . heparin  5,000 Units Subcutaneous Q8H  . multivitamin  1 tablet Oral QHS  . senna  1 tablet Oral BID  . sevelamer carbonate  3,200 mg Oral TID WC

## 2021-02-27 NOTE — Progress Notes (Signed)
[facility] PROGRESS NOTE  Rebekah Burton [id number] DOB: 1976/07/05 DOA: 01/15/2021 PCP: Trey Sailors, PA  Status:  Remains inpatient appropriate because:Unsafe d/c plan and Inpatient level of care appropriate due to severity of illness   Dispo: The patient is from: Homeless              Anticipated d/c is to: ALF vs SNF vs to be determined on 3/1 patient told [facility] staff that she did not wish to pursue skilled nursing facility and instead reported desire to be set up in a hotel.  Medicaid check not available until 3/11.  If medically stable before that date we will ask [facility] supervisor to assist with payment for motel room.              Patient currently is not medically stable to d/c.  Patient has had reemergence of left breast abscess and will be requiring close follow-up as well as oral antibiotics.  Will be undergoing an ultrasound-guided aspiration of the abscess in the left breast on 3/2 and if unsuccessful may require intraoperative debridement.   Difficult to place patient Yes   Level of care: Med-Surg  Code Status: Full Family Communication:  DVT prophylaxis: SQ Heparin Vaccination status: Unknown-Incidentally Covid positive during this admission (01/15/2021)  Foley catheter: No  HPI:  45 y.o. female with PMH significant for ESRD on HD MWF, blindness, hypertension, PAD. Patient presented to the ED on 01/15/2021 with breast abscess, underwent I&D by general surgery.  She was initially treated with IV antibiotics later completed a course with oral antibiotics.  She was also found to be incidental Covid positive.  Hospital course complicated by difficulty with cannulation of aVF, vascular surgery was consulted, underwent fistulogram and was found to have pseudoaneurysm. IR and vascular were consulted. aVF is now working.Dialysis per nephrology.  Further hospital course complicated by recurrence of breast abscess-s/p ultrasound guided drainage on 3/2-and then incision and  drainage by general surgery on 3/3.  See below for further details   Subjective: Some pain at the operative site-but awake and alert.  No major issues overnight.  Objective: Vitals:   02/27/21 0514 02/27/21 0900  BP: (!) 109/56 124/80  Pulse: 66 64  Resp: 18 16  Temp: 98.2 F (36.8 C) 97.9 F (36.6 C)  SpO2: 98% 100%    Intake/Output Summary (Last 24 hours) at 02/27/2021 1041 Last data filed at 02/27/2021 0940 Gross per 24 hour  Intake 880 ml  Output 1850 ml  Net -970 ml   Filed Weights   02/25/21 1031 02/26/21 1345 02/26/21 1640  Weight: 96.8 kg 100.5 kg 99 kg    Exam: Gen Exam:Alert awake-not in any distress HEENT:atraumatic, normocephalic Chest: B/L clear to auscultation anteriorly CVS:S1S2 regular Abdomen:soft non tender, non distended Extremities:no edema Neurology: Non focal Skin: no rash   Assessment/Plan: Acute problems: Breast abscess/recurrent: Continue IV clindamycin-continue wound care per general surgery.  Patient is s/p repeat I&D on 3/3.  Culture on 3/2 and 3/3 - so far.  Biopsy results are pending.  Some concern for underlying malignancy.  Incidental COVID-19: Off isolation-asymptomatic  ESRD: HD per nephrology.  HTN: Controlled-continue clonidine/Coreg  Hyponatremia: Mild-being managed with volume overload with dialysis.  PAD-s/p femoropopliteal bypass-s/p bilateraL TMA  Anemia of chronic disease:monitor intermittently, hemoglobin is stable at 9.1. Continue Aranesp.   Data Reviewed: Basic Metabolic Panel: Recent Labs  Lab 02/22/21 0852 02/24/21 0742 02/25/21 1042 02/26/21 1420  NA 133* 137 134* 133*  K 4.5 3.7 4.3 4.3  CL 94* 100 100 90*  CO2 24 23  --  27  GLUCOSE 106* 112* 112* 190*  BUN 67* 47* 45* 63*  CREATININE 13.61* 9.23* 10.50* 12.94*  CALCIUM 10.7* 9.8  --  10.8*  PHOS 5.3* 3.6  --  4.5   Liver Function Tests: Recent Labs  Lab 02/22/21 0852 02/24/21 0742 02/26/21 1420  ALBUMIN 3.0* 3.0* 3.4*   No results  for input(s): LIPASE, AMYLASE in the last 168 hours. No results for input(s): AMMONIA in the last 168 hours. CBC: Recent Labs  Lab 02/22/21 0852 02/23/21 1028 02/24/21 0742 02/25/21 1042 02/26/21 1408 02/27/21 0501  WBC 9.3 7.3 5.6  --  9.6 6.6  NEUTROABS  --  5.8  --   --   --   --   HGB 9.3* 9.8* 9.6* 12.6 10.0* 10.1*  HCT 30.6* 32.2* 30.5* 37.0 32.3* 33.1*  MCV 98.7 99.4 98.4  --  98.5 99.7  PLT 231 247 231  --  277 249   Cardiac Enzymes: No results for input(s): CKTOTAL, CKMB, CKMBINDEX, TROPONINI in the last 168 hours. BNP (last 3 results) Recent Labs    01/18/21 0136 01/19/21 0030 01/20/21 0020  BNP 1,410.7* 1,122.4* 1,383.7*    ProBNP (last 3 results) No results for input(s): PROBNP in the last 8760 hours.  CBG: No results for input(s): GLUCAP in the last 168 hours.  Recent Results (from the past 240 hour(s))  Aerobic/Anaerobic Culture w Gram Stain (surgical/deep wound)     Status: None (Preliminary result)   Collection Time: 02/24/21  4:57 PM   Specimen: Abscess  Result Value Ref Range Status   Specimen Description ABSCESS BREAST  Final   Special Requests NONE  Final   Gram Stain   Final    ABUNDANT WBC PRESENT, PREDOMINANTLY PMN NO ORGANISMS SEEN    Culture   Final    NO GROWTH 2 DAYS NO ANAEROBES ISOLATED; CULTURE IN PROGRESS FOR 5 DAYS Performed at [facility], [address] [place], [place] [address]    Report Status PENDING  Incomplete  Surgical pcr screen     Status: None   Collection Time: 02/25/21  9:15 AM   Specimen: Nasal Mucosa; Nasal Swab  Result Value Ref Range Status   MRSA, PCR NEGATIVE NEGATIVE Final   Staphylococcus aureus NEGATIVE NEGATIVE Final    Comment: (NOTE) The Xpert SA Assay (FDA approved for NASAL specimens in patients 58 years of age and older), is one component of a comprehensive surveillance program. It is not intended to diagnose infection nor to guide or monitor treatment. Performed at [facility], [place] [address] [place], [place] [address]   Aerobic/Anaerobic Culture w Gram Stain (surgical/deep wound)     Status: None (Preliminary result)   Collection Time: 02/25/21 12:42 PM   Specimen: PATH Cytology Misc. fluid; Body Fluid  Result Value Ref Range Status   Specimen Description BREAST  Final   Special Requests LEFT  Final   Gram Stain   Final    ABUNDANT WBC PRESENT,BOTH PMN AND MONONUCLEAR NO ORGANISMS SEEN    Culture   Final    NO GROWTH < 24 HOURS Performed at [facility], [place] [address], [place], [place] [address]    Report Status PENDING  Incomplete     Studies: No results found.  Scheduled Meds: . carvedilol  25 mg Oral BID WC  . Chlorhexidine Gluconate Cloth  6 each Topical Q0600  . cloNIDine  0.3 mg Oral TID  . darbepoetin (ARANESP) injection - DIALYSIS  150 mcg Intravenous Q Mon-HD  . heparin  5,000 Units Subcutaneous Q8H  . multivitamin  1 tablet Oral QHS  . senna  1 tablet Oral BID  . sevelamer carbonate  3,200 mg Oral TID WC   Continuous Infusions: . sodium chloride    . sodium chloride 10 mL/hr at 02/27/21 0831  . clindamycin (CLEOCIN) IV 600 mg (02/27/21 0848)    Active Problems:   ESRD (end stage renal disease) ([place])   Breast abscess   COVID-19 virus infection   Breast abscess of female   Left breast abscess   Poor social situation   Consultants:  Nephrology  General surgery  Interventional radiology  Procedures:  Aspiration of breast abscess per IR 3/1 with further image guided aspiration of complex breast abscess planned for 3/2  Antibiotics: Anti-infectives (From admission, onward)   Start     Dose/Rate Route Frequency Ordered Stop   02/24/21 2200  amoxicillin-clavulanate (AUGMENTIN) 500-125 MG per tablet 500 mg  Status:  Discontinued        1 tablet Oral Daily at bedtime 02/23/21 1149 02/24/21 0817   02/24/21 1200  clindamycin (CLEOCIN) capsule 300 mg  Status:  Discontinued        300 mg Oral Every 6 hours 02/24/21 0817  02/24/21 0952   02/24/21 1200  clindamycin (CLEOCIN) IVPB 600 mg        600 mg 100 mL/hr over 30 Minutes Intravenous Every 6 hours 02/24/21 0952     02/23/21 1245  amoxicillin-clavulanate (AUGMENTIN) 500-125 MG per tablet 500 mg        1 tablet Oral  Once 02/23/21 1149 02/23/21 1445   02/23/21 1215  amoxicillin-clavulanate (AUGMENTIN) 875-125 MG per tablet 1 tablet  Status:  Discontinued        1 tablet Oral Every 12 hours 02/23/21 1120 02/23/21 1149   01/23/21 1400  amoxicillin-clavulanate (AUGMENTIN) 500-125 MG per tablet 500 mg        500 mg Oral Every 24 hours 01/23/21 1124 01/25/21 1657   01/23/21 1015  amoxicillin-clavulanate (AUGMENTIN) 875-125 MG per tablet 1 tablet  Status:  Discontinued        1 tablet Oral Every 12 hours 01/23/21 1002 01/23/21 1124   01/20/21 0845  vancomycin (VANCOCIN) 1-5 GM/200ML-% IVPB       Note to Pharmacy: Judieth Keens  : cabinet override      01/20/21 0845 01/20/21 2059   01/18/21 1200  vancomycin (VANCOCIN) IVPB 1000 mg/200 mL premix  Status:  Discontinued        1,000 mg 200 mL/hr over 60 Minutes Intravenous Every M-W-F (Hemodialysis) 01/16/21 1017 01/21/21 1324   01/17/21 2200  Ampicillin-Sulbactam (UNASYN) 3 g in sodium chloride 0.9 % 100 mL IVPB  Status:  Discontinued        3 g 200 mL/hr over 30 Minutes Intravenous Daily at bedtime 01/16/21 1058 01/23/21 1002   01/17/21 1000  remdesivir 100 mg in sodium chloride 0.9 % 100 mL IVPB       "Followed by" Linked Group Details   100 mg 200 mL/hr over 30 Minutes Intravenous Daily 01/15/21 2357 01/18/21 0918   01/16/21 1145  Ampicillin-Sulbactam (UNASYN) 3 g in sodium chloride 0.9 % 100 mL IVPB        3 g 200 mL/hr over 30 Minutes Intravenous  Once 01/16/21 1058 01/16/21 1417   01/16/21 1100  vancomycin (VANCOREADY) IVPB 1500 mg/300 mL  1,500 mg 150 mL/hr over 120 Minutes Intravenous  Once 01/16/21 1017 01/16/21 1849   01/16/21 0300  remdesivir 200 mg in sodium chloride 0.9% 250 mL IVPB        "Followed by" Linked Group Details   200 mg 580 mL/hr over 30 Minutes Intravenous Once 01/15/21 2357 01/16/21 0543   01/15/21 2345  amoxicillin-clavulanate (AUGMENTIN) 875-125 MG per tablet 1 tablet  Status:  Discontinued        1 tablet Oral Every 12 hours 01/15/21 2334 01/15/21 2357   01/15/21 1800  vancomycin (VANCOCIN) IVPB 1000 mg/200 mL premix        1,000 mg 200 mL/hr over 60 Minutes Intravenous  Once 01/15/21 1753 01/15/21 2120       Time spent: 20 minutes    Shanker Ghimire ANP  [facility] 7 am - 330 pm/M-F for direct patient care and secure chat Please refer to Amion for contact info 43  days

## 2021-02-28 DIAGNOSIS — N611 Abscess of the breast and nipple: Secondary | ICD-10-CM | POA: Diagnosis not present

## 2021-02-28 LAB — CBC
HCT: 34.7 % — ABNORMAL LOW (ref 36.0–46.0)
Hemoglobin: 10.7 g/dL — ABNORMAL LOW (ref 12.0–15.0)
MCH: 30.4 pg (ref 26.0–34.0)
MCHC: 30.8 g/dL (ref 30.0–36.0)
MCV: 98.6 fL (ref 80.0–100.0)
Platelets: 267 10*3/uL (ref 150–400)
RBC: 3.52 MIL/uL — ABNORMAL LOW (ref 3.87–5.11)
RDW: 17.9 % — ABNORMAL HIGH (ref 11.5–15.5)
WBC: 6.8 10*3/uL (ref 4.0–10.5)
nRBC: 0.3 % — ABNORMAL HIGH (ref 0.0–0.2)

## 2021-02-28 NOTE — Progress Notes (Signed)
   02/28/21 0044  Provider Notification  Provider Name/Title Dr. Marlowe Sax  Date Provider Notified 02/28/21  Time Provider Notified [phone]  Notification Type Page  Notification Reason Other (Comment) (Patient continues to refuse SQ Heparin)  Provider response Evaluate remotely;Other (Comment) (MD called and spoke with patient on the phone)  Date of Provider Response 02/28/21   Patient continues to refuse her heparin injections.  MD called and made aware.  Dr. Marlowe Sax called and spoke with the patient on the phone.  Patient agrees to at least try to take the injections.  Will continue to monitor the patient for compliance.  Earleen Reaper RN

## 2021-02-28 NOTE — Progress Notes (Signed)
Overnight floor coverage progress note  I was notified by patient's nurse that she has continued to refuse both chemical and mechanical DVT prophylaxis during this hospitalization.  I spoke to the patient and explained to her that she is at high risk for thromboembolism (DVT/PE) given her prolonged hospitalization.  Based on our conversation, it seems that the patient thought that the heparin she was receiving to flush her dialysis catheter was enough to prevent blood clots.  She now understands that she needs to receive subcutaneous heparin to prevent blood clots.  Please continue to monitor closely to see if she is receiving heparin consistently and also watch out for signs of DVT/PE.

## 2021-02-28 NOTE — Progress Notes (Signed)
[facility] PROGRESS NOTE  Ciel Boeckmann [id number] DOB: 1976-03-02 DOA: 01/15/2021 PCP: Trey Sailors, PA  Status:  Remains inpatient appropriate because:Unsafe d/c plan and Inpatient level of care appropriate due to severity of illness   Dispo: The patient is from: Homeless              Anticipated d/c is to: ALF vs SNF vs to be determined on 3/1 patient told [facility] staff that she did not wish to pursue skilled nursing facility and instead reported desire to be set up in a hotel.  Medicaid check not available until 3/11.  If medically stable before that date we will ask [facility] supervisor to assist with payment for motel room.              Patient currently is not medically stable to d/c.  Patient has had reemergence of left breast abscess and will be requiring close follow-up as well as oral antibiotics.  Will be undergoing an ultrasound-guided aspiration of the abscess in the left breast on 3/2 and if unsuccessful may require intraoperative debridement.   Difficult to place patient Yes   Level of care: Med-Surg  Code Status: Full Family Communication:  DVT prophylaxis: SQ Heparin Vaccination status: Unknown-Incidentally Covid positive during this admission (01/15/2021)  Foley catheter: No  HPI:  45 y.o. female with PMH significant for ESRD on HD MWF, blindness, hypertension, PAD. Patient presented to the ED on 01/15/2021 with breast abscess, underwent I&D by general surgery.  She was initially treated with IV antibiotics later completed a course with oral antibiotics.  She was also found to be incidental Covid positive.  Hospital course complicated by difficulty with cannulation of aVF, vascular surgery was consulted, underwent fistulogram and was found to have pseudoaneurysm. IR and vascular were consulted. aVF is now working.Dialysis per nephrology.  Further hospital course complicated by recurrence of breast abscess-s/p ultrasound guided drainage on 3/2-and then incision and  drainage by general surgery on 3/3.  See below for further details   Subjective: Some pain at the operative site-but awake and alert.  No major issues overnight.  Objective: Vitals:   02/27/21 2022 02/28/21 0957  BP: 129/78 114/61  Pulse: 76 69  Resp:  16  Temp: 98 F (36.7 C) 98 F (36.7 C)  SpO2: 95% 97%    Intake/Output Summary (Last 24 hours) at 02/28/2021 1140 Last data filed at 02/28/2021 0600 Gross per 24 hour  Intake 840 ml  Output 0 ml  Net 840 ml   Filed Weights   02/25/21 1031 02/26/21 1345 02/26/21 1640  Weight: 96.8 kg 100.5 kg 99 kg    Exam: Gen Exam:Alert awake-not in any distress HEENT:atraumatic, normocephalic Chest: B/L clear to auscultation anteriorly CVS:S1S2 regular Abdomen:soft non tender, non distended Extremities:no edema Neurology: Non focal Skin: no rash   Assessment/Plan: Breast abscess/recurrent: Lorrin Goodell leukocytosis-switch to oral clindamycin-General surgery continues to follow.  All cultures negative so far-, biopsy negative for malignancy.  Patient is s/p repeat I&D on 3/3.    Incidental COVID-19: Off isolation-asymptomatic  ESRD: HD per nephrology.  HTN: Controlled-continue clonidine/Coreg  Hyponatremia: Mild-being managed with volume overload with dialysis.  PAD-s/p femoropopliteal bypass-s/p bilateraL TMA  Anemia of chronic disease:monitor intermittently, hemoglobin is stable at 9.1. Continue Aranesp.   Data Reviewed: Basic Metabolic Panel: Recent Labs  Lab 02/22/21 0852 02/24/21 0742 02/25/21 1042 02/26/21 1420  NA 133* 137 134* 133*  K 4.5 3.7 4.3 4.3  CL 94* 100 100 90*  CO2 24 23  --  27  GLUCOSE 106* 112* 112* 190*  BUN 67* 47* 45* 63*  CREATININE 13.61* 9.23* 10.50* 12.94*  CALCIUM 10.7* 9.8  --  10.8*  PHOS 5.3* 3.6  --  4.5   Liver Function Tests: Recent Labs  Lab 02/22/21 0852 02/24/21 0742 02/26/21 1420  ALBUMIN 3.0* 3.0* 3.4*   No results for input(s): LIPASE, AMYLASE in the last 168  hours. No results for input(s): AMMONIA in the last 168 hours. CBC: Recent Labs  Lab 02/23/21 1028 02/24/21 0742 02/25/21 1042 02/26/21 1408 02/27/21 0501 02/28/21 0409  WBC 7.3 5.6  --  9.6 6.6 6.8  NEUTROABS 5.8  --   --   --   --   --   HGB 9.8* 9.6* 12.6 10.0* 10.1* 10.7*  HCT 32.2* 30.5* 37.0 32.3* 33.1* 34.7*  MCV 99.4 98.4  --  98.5 99.7 98.6  PLT 247 231  --  277 249 267   Cardiac Enzymes: No results for input(s): CKTOTAL, CKMB, CKMBINDEX, TROPONINI in the last 168 hours. BNP (last 3 results) Recent Labs    01/18/21 0136 01/19/21 0030 01/20/21 0020  BNP 1,410.7* 1,122.4* 1,383.7*    ProBNP (last 3 results) No results for input(s): PROBNP in the last 8760 hours.  CBG: No results for input(s): GLUCAP in the last 168 hours.  Recent Results (from the past 240 hour(s))  Aerobic/Anaerobic Culture w Gram Stain (surgical/deep wound)     Status: None (Preliminary result)   Collection Time: 02/24/21  4:57 PM   Specimen: Abscess  Result Value Ref Range Status   Specimen Description ABSCESS BREAST  Final   Special Requests NONE  Final   Gram Stain   Final    ABUNDANT WBC PRESENT, PREDOMINANTLY PMN NO ORGANISMS SEEN    Culture   Final    NO GROWTH 4 DAYS NO ANAEROBES ISOLATED; CULTURE IN PROGRESS FOR 5 DAYS Performed at [facility], [address], [place], [place] [address]    Report Status PENDING  Incomplete  Surgical pcr screen     Status: None   Collection Time: 02/25/21  9:15 AM   Specimen: Nasal Mucosa; Nasal Swab  Result Value Ref Range Status   MRSA, PCR NEGATIVE NEGATIVE Final   Staphylococcus aureus NEGATIVE NEGATIVE Final    Comment: (NOTE) The Xpert SA Assay (FDA approved for NASAL specimens in patients 10 years of age and older), is one component of a comprehensive surveillance program. It is not intended to diagnose infection nor to guide or monitor treatment. Performed at [facility], [place] [address], [place], [place] [address]    Aerobic/Anaerobic Culture w Gram Stain (surgical/deep wound)     Status: None (Preliminary result)   Collection Time: 02/25/21 12:42 PM   Specimen: PATH Cytology Misc. fluid; Body Fluid  Result Value Ref Range Status   Specimen Description BREAST  Final   Special Requests LEFT  Final   Gram Stain   Final    ABUNDANT WBC PRESENT,BOTH PMN AND MONONUCLEAR NO ORGANISMS SEEN    Culture   Final    NO GROWTH 2 DAYS Performed at [facility], [address], [place], [place] [address]    Report Status PENDING  Incomplete     Studies: No results found.  Scheduled Meds: . carvedilol  25 mg Oral BID WC  . Chlorhexidine Gluconate Cloth  6 each Topical Q0600  . clindamycin  300 mg Oral Q8H  . cloNIDine  0.3 mg Oral TID  .  darbepoetin (ARANESP) injection - DIALYSIS  150 mcg Intravenous Q Mon-HD  . heparin  5,000 Units Subcutaneous Q8H  . multivitamin  1 tablet Oral QHS  . senna  1 tablet Oral BID  . sevelamer carbonate  3,200 mg Oral TID WC   Continuous Infusions: . sodium chloride    . sodium chloride 10 mL/hr at 02/27/21 [id number]    Active Problems:   ESRD (end stage renal disease) ([place])   Breast abscess   COVID-19 virus infection   Breast abscess of female   Left breast abscess   Poor social situation   Consultants:  Nephrology  General surgery  Interventional radiology  Procedures:  Aspiration of breast abscess per IR 3/1 with further image guided aspiration of complex breast abscess planned for 3/2  Antibiotics: Anti-infectives (From admission, onward)   Start     Dose/Rate Route Frequency Ordered Stop   02/27/21 1730  clindamycin (CLEOCIN) capsule 300 mg        300 mg Oral Every 8 hours 02/27/21 1631 03/03/21 1359   02/24/21 2200  amoxicillin-clavulanate (AUGMENTIN) 500-125 MG per tablet 500 mg  Status:  Discontinued        1 tablet Oral Daily at bedtime 02/23/21 1149 02/24/21 0817   02/24/21 1200  clindamycin (CLEOCIN) capsule 300 mg  Status:  Discontinued         300 mg Oral Every 6 hours 02/24/21 0817 02/24/21 0952   02/24/21 1200  clindamycin (CLEOCIN) IVPB 600 mg  Status:  Discontinued        600 mg 100 mL/hr over 30 Minutes Intravenous Every 6 hours 02/24/21 0952 02/27/21 1631   02/23/21 1245  amoxicillin-clavulanate (AUGMENTIN) 500-125 MG per tablet 500 mg        1 tablet Oral  Once 02/23/21 1149 02/23/21 1445   02/23/21 1215  amoxicillin-clavulanate (AUGMENTIN) 875-125 MG per tablet 1 tablet  Status:  Discontinued        1 tablet Oral Every 12 hours 02/23/21 1120 02/23/21 1149   01/23/21 1400  amoxicillin-clavulanate (AUGMENTIN) 500-125 MG per tablet 500 mg        500 mg Oral Every 24 hours 01/23/21 1124 01/25/21 1657   01/23/21 1015  amoxicillin-clavulanate (AUGMENTIN) 875-125 MG per tablet 1 tablet  Status:  Discontinued        1 tablet Oral Every 12 hours 01/23/21 1002 01/23/21 1124   01/20/21 0845  vancomycin (VANCOCIN) 1-5 GM/200ML-% IVPB       Note to Pharmacy: Judieth Keens  : cabinet override      01/20/21 0845 01/20/21 2059   01/18/21 1200  vancomycin (VANCOCIN) IVPB 1000 mg/200 mL premix  Status:  Discontinued        1,000 mg 200 mL/hr over 60 Minutes Intravenous Every M-W-F (Hemodialysis) 01/16/21 1017 01/21/21 1324   01/17/21 2200  Ampicillin-Sulbactam (UNASYN) 3 g in sodium chloride 0.9 % 100 mL IVPB  Status:  Discontinued        3 g 200 mL/hr over 30 Minutes Intravenous Daily at bedtime 01/16/21 1058 01/23/21 1002   01/17/21 1000  remdesivir 100 mg in sodium chloride 0.9 % 100 mL IVPB       "Followed by" Linked Group Details   100 mg 200 mL/hr over 30 Minutes Intravenous Daily 01/15/21 2357 01/18/21 0918   01/16/21 1145  Ampicillin-Sulbactam (UNASYN) 3 g in sodium chloride 0.9 % 100 mL IVPB        3 g 200 mL/hr over 30 Minutes Intravenous  Once 01/16/21  1058 01/16/21 1417   01/16/21 1100  vancomycin (VANCOREADY) IVPB 1500 mg/300 mL        1,500 mg 150 mL/hr over 120 Minutes Intravenous  Once 01/16/21 1017  01/16/21 1849   01/16/21 0300  remdesivir 200 mg in sodium chloride 0.9% 250 mL IVPB       "Followed by" Linked Group Details   200 mg 580 mL/hr over 30 Minutes Intravenous Once 01/15/21 2357 01/16/21 0543   01/15/21 2345  amoxicillin-clavulanate (AUGMENTIN) 875-125 MG per tablet 1 tablet  Status:  Discontinued        1 tablet Oral Every 12 hours 01/15/21 2334 01/15/21 2357   01/15/21 1800  vancomycin (VANCOCIN) IVPB 1000 mg/200 mL premix        1,000 mg 200 mL/hr over 60 Minutes Intravenous  Once 01/15/21 1753 01/15/21 2120       Time spent: 15 minutes    Shanker Ghimire ANP  [facility] 7 am - 330 pm/M-F for direct patient care and secure chat Please refer to Amion for contact info 44  days

## 2021-02-28 NOTE — Progress Notes (Signed)
General Surgery  Left breast pain remains the same. Switched to oral antibiotics yesterday, remains afebrile with normal WBC. Etiology of recurrent cellulitis still unclear. Will need diagnostic mammogram when discharged for further workup. We will continue to follow.  Michaelle Birks, MD [facility] Surgery General, Hepatobiliary and Pancreatic Surgery 02/28/21 11:23 AM

## 2021-02-28 NOTE — Plan of Care (Signed)
  Problem: Education: Goal: Knowledge of disease and its progression will improve Outcome: Progressing   

## 2021-03-01 DIAGNOSIS — N611 Abscess of the breast and nipple: Secondary | ICD-10-CM | POA: Diagnosis not present

## 2021-03-01 LAB — RENAL FUNCTION PANEL
Albumin: 3.3 g/dL — ABNORMAL LOW (ref 3.5–5.0)
Anion gap: 17 — ABNORMAL HIGH (ref 5–15)
BUN: 63 mg/dL — ABNORMAL HIGH (ref 6–20)
CO2: 24 mmol/L (ref 22–32)
Calcium: 10.3 mg/dL (ref 8.9–10.3)
Chloride: 93 mmol/L — ABNORMAL LOW (ref 98–111)
Creatinine, Ser: 14.14 mg/dL — ABNORMAL HIGH (ref 0.44–1.00)
GFR, Estimated: 3 mL/min — ABNORMAL LOW (ref >60)
Glucose, Bld: 132 mg/dL — ABNORMAL HIGH (ref 70–99)
Phosphorus: 6.2 mg/dL — ABNORMAL HIGH (ref 2.5–4.6)
Potassium: 4.4 mmol/L (ref 3.5–5.1)
Sodium: 134 mmol/L — ABNORMAL LOW (ref 135–145)

## 2021-03-01 MED ORDER — PROSOURCE PLUS PO LIQD
30.0000 mL | Freq: Two times a day (BID) | ORAL | Status: DC
  Administered 2021-03-05 – 2021-03-11 (×6): 30 mL via ORAL
  Filled 2021-03-01 (×21): qty 30

## 2021-03-01 MED ORDER — MORPHINE SULFATE (PF) 2 MG/ML IV SOLN
INTRAVENOUS | Status: AC
  Filled 2021-03-01: qty 1

## 2021-03-01 MED ORDER — DARBEPOETIN ALFA 150 MCG/0.3ML IJ SOSY
PREFILLED_SYRINGE | INTRAMUSCULAR | Status: AC
  Filled 2021-03-01: qty 0.3

## 2021-03-01 NOTE — Progress Notes (Signed)
[facility] PROGRESS NOTE  Rebekah Burton [id number] DOB: 08-Sep-1976 DOA: 01/15/2021 PCP: Trey Sailors, PA  Status:  Remains inpatient appropriate because:Unsafe d/c plan and Inpatient level of care appropriate due to severity of illness   Dispo: The patient is from: Homeless              Anticipated d/c is to: ALF vs SNF vs to be determined on 3/1 patient told [facility] staff that she did not wish to pursue skilled nursing facility and instead reported desire to be set up in a hotel.  Medicaid check not available until 3/11.  If medically stable before that date we will ask [facility] supervisor to assist with payment for motel room.              Patient currently is not medically stable to d/c.  Patient has had reemergence of left breast abscess and will be requiring close follow-up as well as oral antibiotics.  Will be undergoing an ultrasound-guided aspiration of the abscess in the left breast on 3/2 and if unsuccessful may require intraoperative debridement.   Difficult to place patient Yes   Level of care: Med-Surg  Code Status: Full Family Communication:  DVT prophylaxis: SQ Heparin Vaccination status: Unknown-Incidentally Covid positive during this admission (01/15/2021)  Foley catheter: No  HPI:  45 y.o. female with PMH significant for ESRD on HD MWF, blindness, hypertension, PAD. Patient presented to the ED on 01/15/2021 with breast abscess, underwent I&D by general surgery.  She was initially treated with IV antibiotics later completed a course with oral antibiotics.  She was also found to be incidental Covid positive.  Hospital course complicated by difficulty with cannulation of aVF, vascular surgery was consulted, underwent fistulogram and was found to have pseudoaneurysm. IR and vascular were consulted. aVF is now working.Dialysis per nephrology.  Further hospital course complicated by recurrence of breast abscess-s/p ultrasound guided drainage on 3/2-and then incision and  drainage by general surgery on 3/3.  See below for further details   Subjective: Some pain in the left breast area-lying comfortably in bed otherwise.  Objective: Vitals:   03/01/21 0626 03/01/21 0641  BP:  (!) 144/80  Pulse:  70  Resp:  18  Temp: 98 F (36.7 C) 98 F (36.7 C)  SpO2:  98%    Intake/Output Summary (Last 24 hours) at 03/01/2021 1054 Last data filed at 03/01/2021 0600 Gross per 24 hour  Intake 720 ml  Output 0 ml  Net 720 ml   Filed Weights   02/25/21 1031 02/26/21 1345 02/26/21 1640  Weight: 96.8 kg 100.5 kg 99 kg    Exam: Gen Exam:Alert awake-not in any distress HEENT:atraumatic, normocephalic Chest: B/L clear to auscultation anteriorly CVS:S1S2 regular Abdomen:soft non tender, non distended Extremities:no edema Neurology: Non focal Skin: no rash   Assessment/Plan: Breast abscess/recurrent: Lorrin Goodell leukocytosis-on oral clindamycin-General surgery continues to follow. All cultures negative to date, biopsy negative for malignancy patient is s/p repeat I&D on 3/3.  Incidental COVID-19: Off isolation-asymptomatic  ESRD: HD per nephrology.  HTN: Controlled-continue clonidine/Coreg  Hyponatremia: Mild-being managed with volume overload with dialysis.  PAD-s/p femoropopliteal bypass-s/p bilateraL TMA  Anemia of chronic disease:monitor intermittently, hemoglobin is stable at 9.1. Continue Aranesp.   Data Reviewed: Basic Metabolic Panel: Recent Labs  Lab 02/24/21 0742 02/25/21 1042 02/26/21 1420 03/01/21 0404  NA 137 134* 133* 134*  K 3.7 4.3 4.3 4.4  CL 100 100 90* 93*  CO2 23  --  27 24  GLUCOSE 112* 112*  190* 132*  BUN 47* 45* 63* 63*  CREATININE 9.23* 10.50* 12.94* 14.14*  CALCIUM 9.8  --  10.8* 10.3  PHOS 3.6  --  4.5 6.2*   Liver Function Tests: Recent Labs  Lab 02/24/21 0742 02/26/21 1420 03/01/21 0404  ALBUMIN 3.0* 3.4* 3.3*   No results for input(s): LIPASE, AMYLASE in the last 168 hours. No results for input(s):  AMMONIA in the last 168 hours. CBC: Recent Labs  Lab 02/23/21 1028 02/24/21 0742 02/25/21 1042 02/26/21 1408 02/27/21 0501 02/28/21 0409  WBC 7.3 5.6  --  9.6 6.6 6.8  NEUTROABS 5.8  --   --   --   --   --   HGB 9.8* 9.6* 12.6 10.0* 10.1* 10.7*  HCT 32.2* 30.5* 37.0 32.3* 33.1* 34.7*  MCV 99.4 98.4  --  98.5 99.7 98.6  PLT 247 231  --  277 249 267   Cardiac Enzymes: No results for input(s): CKTOTAL, CKMB, CKMBINDEX, TROPONINI in the last 168 hours. BNP (last 3 results) Recent Labs    01/18/21 0136 01/19/21 0030 01/20/21 0020  BNP 1,410.7* 1,122.4* 1,383.7*    ProBNP (last 3 results) No results for input(s): PROBNP in the last 8760 hours.  CBG: No results for input(s): GLUCAP in the last 168 hours.  Recent Results (from the past 240 hour(s))  Aerobic/Anaerobic Culture w Gram Stain (surgical/deep wound)     Status: None (Preliminary result)   Collection Time: 02/24/21  4:57 PM   Specimen: Abscess  Result Value Ref Range Status   Specimen Description ABSCESS BREAST  Final   Special Requests NONE  Final   Gram Stain   Final    ABUNDANT WBC PRESENT, PREDOMINANTLY PMN NO ORGANISMS SEEN    Culture   Final    NO GROWTH 4 DAYS NO ANAEROBES ISOLATED; CULTURE IN PROGRESS FOR 5 DAYS Performed at [facility], [address], [place], [place] [address]    Report Status PENDING  Incomplete  Surgical pcr screen     Status: None   Collection Time: 02/25/21  9:15 AM   Specimen: Nasal Mucosa; Nasal Swab  Result Value Ref Range Status   MRSA, PCR NEGATIVE NEGATIVE Final   Staphylococcus aureus NEGATIVE NEGATIVE Final    Comment: (NOTE) The Xpert SA Assay (FDA approved for NASAL specimens in patients 56 years of age and older), is one component of a comprehensive surveillance program. It is not intended to diagnose infection nor to guide or monitor treatment. Performed at [facility], [place] [address] [place], [place] [address]   Aerobic/Anaerobic Culture w  Gram Stain (surgical/deep wound)     Status: None (Preliminary result)   Collection Time: 02/25/21 12:42 PM   Specimen: PATH Cytology Misc. fluid; Body Fluid  Result Value Ref Range Status   Specimen Description BREAST  Final   Special Requests LEFT  Final   Gram Stain   Final    ABUNDANT WBC PRESENT,BOTH PMN AND MONONUCLEAR NO ORGANISMS SEEN    Culture   Final    NO GROWTH 3 DAYS Performed at [facility], [address] [place], [place] [address]    Report Status PENDING  Incomplete     Studies: No results found.  Scheduled Meds: . carvedilol  25 mg Oral BID WC  . Chlorhexidine Gluconate Cloth  6 each Topical Q0600  . clindamycin  300 mg Oral Q8H  . cloNIDine  0.3 mg Oral TID  . darbepoetin (ARANESP) injection -  DIALYSIS  150 mcg Intravenous Q Mon-HD  . heparin  5,000 Units Subcutaneous Q8H  . multivitamin  1 tablet Oral QHS  . senna  1 tablet Oral BID  . sevelamer carbonate  3,200 mg Oral TID WC   Continuous Infusions: . sodium chloride    . sodium chloride 10 mL/hr at 02/27/21 [id number]    Active Problems:   ESRD (end stage renal disease) ([place])   Breast abscess   COVID-19 virus infection   Breast abscess of female   Left breast abscess   Poor social situation   Consultants:  Nephrology  General surgery  Interventional radiology  Procedures:  Aspiration of breast abscess per IR 3/1 with further image guided aspiration of complex breast abscess planned for 3/2  Antibiotics: Anti-infectives (From admission, onward)   Start     Dose/Rate Route Frequency Ordered Stop   02/27/21 1730  clindamycin (CLEOCIN) capsule 300 mg        300 mg Oral Every 8 hours 02/27/21 1631 03/03/21 1359   02/24/21 2200  amoxicillin-clavulanate (AUGMENTIN) 500-125 MG per tablet 500 mg  Status:  Discontinued        1 tablet Oral Daily at bedtime 02/23/21 1149 02/24/21 0817   02/24/21 1200  clindamycin (CLEOCIN) capsule 300 mg  Status:  Discontinued        300 mg Oral Every 6  hours 02/24/21 0817 02/24/21 0952   02/24/21 1200  clindamycin (CLEOCIN) IVPB 600 mg  Status:  Discontinued        600 mg 100 mL/hr over 30 Minutes Intravenous Every 6 hours 02/24/21 0952 02/27/21 1631   02/23/21 1245  amoxicillin-clavulanate (AUGMENTIN) 500-125 MG per tablet 500 mg        1 tablet Oral  Once 02/23/21 1149 02/23/21 1445   02/23/21 1215  amoxicillin-clavulanate (AUGMENTIN) 875-125 MG per tablet 1 tablet  Status:  Discontinued        1 tablet Oral Every 12 hours 02/23/21 1120 02/23/21 1149   01/23/21 1400  amoxicillin-clavulanate (AUGMENTIN) 500-125 MG per tablet 500 mg        500 mg Oral Every 24 hours 01/23/21 1124 01/25/21 1657   01/23/21 1015  amoxicillin-clavulanate (AUGMENTIN) 875-125 MG per tablet 1 tablet  Status:  Discontinued        1 tablet Oral Every 12 hours 01/23/21 1002 01/23/21 1124   01/20/21 0845  vancomycin (VANCOCIN) 1-5 GM/200ML-% IVPB       Note to Pharmacy: Judieth Keens  : cabinet override      01/20/21 0845 01/20/21 2059   01/18/21 1200  vancomycin (VANCOCIN) IVPB 1000 mg/200 mL premix  Status:  Discontinued        1,000 mg 200 mL/hr over 60 Minutes Intravenous Every M-W-F (Hemodialysis) 01/16/21 1017 01/21/21 1324   01/17/21 2200  Ampicillin-Sulbactam (UNASYN) 3 g in sodium chloride 0.9 % 100 mL IVPB  Status:  Discontinued        3 g 200 mL/hr over 30 Minutes Intravenous Daily at bedtime 01/16/21 1058 01/23/21 1002   01/17/21 1000  remdesivir 100 mg in sodium chloride 0.9 % 100 mL IVPB       "Followed by" Linked Group Details   100 mg 200 mL/hr over 30 Minutes Intravenous Daily 01/15/21 2357 01/18/21 0918   01/16/21 1145  Ampicillin-Sulbactam (UNASYN) 3 g in sodium chloride 0.9 % 100 mL IVPB        3 g 200 mL/hr over 30 Minutes Intravenous  Once 01/16/21 1058 01/16/21 1417  01/16/21 1100  vancomycin (VANCOREADY) IVPB 1500 mg/300 mL        1,500 mg 150 mL/hr over 120 Minutes Intravenous  Once 01/16/21 1017 01/16/21 1849   01/16/21 0300   remdesivir 200 mg in sodium chloride 0.9% 250 mL IVPB       "Followed by" Linked Group Details   200 mg 580 mL/hr over 30 Minutes Intravenous Once 01/15/21 2357 01/16/21 0543   01/15/21 2345  amoxicillin-clavulanate (AUGMENTIN) 875-125 MG per tablet 1 tablet  Status:  Discontinued        1 tablet Oral Every 12 hours 01/15/21 2334 01/15/21 2357   01/15/21 1800  vancomycin (VANCOCIN) IVPB 1000 mg/200 mL premix        1,000 mg 200 mL/hr over 60 Minutes Intravenous  Once 01/15/21 1753 01/15/21 2120       Time spent: 15 minutes    Batina Dougan ANP  [facility] 7 am - 330 pm/M-F for direct patient care and secure chat Please refer to Amion for contact info 45  days

## 2021-03-01 NOTE — Progress Notes (Signed)
[facility] Progress Note   Subjective: Seen in room. No C/Os. HD tomorrow on schedule.   Objective Vitals:   02/28/21 1803 02/28/21 2000 03/01/21 0626 03/01/21 0641  BP: 115/83 130/75  (!) 144/80  Pulse: 71 69  70  Resp: '18 18  18  '$ Temp: (!) 97.4 F (36.3 C) (!) 97.3 F (36.3 C) 98 F (36.7 C) 98 F (36.7 C)  TempSrc: Oral Oral Oral Oral  SpO2: 96% 99%  98%  Weight:      Height:       Physical Exam General: obese female in NAD Heart: S1,S2, RRR Lungs: CTAB Abdomen: Active BS Extremities: No LE edema.  Dialysis Access: L AVF + bruit    Additional Objective Labs: Basic Metabolic Panel: Recent Labs  Lab 02/24/21 0742 02/25/21 1042 02/26/21 1420 03/01/21 0404  NA 137 134* 133* 134*  K 3.7 4.3 4.3 4.4  CL 100 100 90* 93*  CO2 23  --  27 24  GLUCOSE 112* 112* 190* 132*  BUN 47* 45* 63* 63*  CREATININE 9.23* 10.50* 12.94* 14.14*  CALCIUM 9.8  --  10.8* 10.3  PHOS 3.6  --  4.5 6.2*   Liver Function Tests: Recent Labs  Lab 02/24/21 0742 02/26/21 1420 03/01/21 0404  ALBUMIN 3.0* 3.4* 3.3*   No results for input(s): LIPASE, AMYLASE in the last 168 hours. CBC: Recent Labs  Lab 02/23/21 1028 02/24/21 0742 02/25/21 1042 02/26/21 1408 02/27/21 0501 02/28/21 0409  WBC 7.3 5.6  --  9.6 6.6 6.8  NEUTROABS 5.8  --   --   --   --   --   HGB 9.8* 9.6*   < > 10.0* 10.1* 10.7*  HCT 32.2* 30.5*   < > 32.3* 33.1* 34.7*  MCV 99.4 98.4  --  98.5 99.7 98.6  PLT 247 231  --  277 249 267   < > = values in this interval not displayed.   Blood Culture    Component Value Date/Time   SDES BREAST 02/25/2021 1242   SPECREQUEST LEFT 02/25/2021 1242   CULT  02/25/2021 1242    NO GROWTH 3 DAYS Performed at [facility], [place] [address], [place], [place] [address]    REPTSTATUS PENDING 02/25/2021 1242    Cardiac Enzymes: No results for input(s): CKTOTAL, CKMB, CKMBINDEX, TROPONINI in the last 168 hours. CBG: No results for input(s): GLUCAP in  the last 168 hours. Iron Studies: No results for input(s): IRON, TIBC, TRANSFERRIN, FERRITIN in the last 72 hours. '@lablastinr3'$ @ Studies/Results: No results found. Medications: . sodium chloride    . sodium chloride 10 mL/hr at 02/27/21 0831   . carvedilol  25 mg Oral BID WC  . Chlorhexidine Gluconate Cloth  6 each Topical Q0600  . clindamycin  300 mg Oral Q8H  . cloNIDine  0.3 mg Oral TID  . darbepoetin (ARANESP) injection - DIALYSIS  150 mcg Intravenous Q Mon-HD  . heparin  5,000 Units Subcutaneous Q8H  . multivitamin  1 tablet Oral QHS  . senna  1 tablet Oral BID  . sevelamer carbonate  3,200 mg Oral TID WC   Dialysis Orders: [facility] 4h 59mn 2/2.5 bath 105kg AVF  parsabiv 15 mg IV three times a week with HD mircera 150 mcg IV every 2 weeks - last given 01/13/21 hectorol 3 mcg IV three times a week  Assessment/Plan: 1. Left breast abscess -admit team and CCS managing, status post I&D abscess 01/15/2021 and 02/25/21 pathology was negative  for malignancy on 01/21"although some concern for possible inflammatory breast cancer"repeat biopsy done 03/03. Now on oral clindamycin.  2. HD vascular access: Recurrentcannulationissuesw/AVF.IR completed f'gram 01/25/21 showing significant pseudoaneurysm s/p thrombin injection and near complete thrombosis of pseudoaneurysm per IR.Cannulation issues now appear to have resolved, will continue to monitor. 3. Covid 19 on admission. Now out of isolation window.  5. ESRD:  MWF. Next HD today. No heparin.  6. Secondary hyperparathyroidism - PO4 slightly elevated. C Ca high. Reminded to take binders. Use 2.0 Ca bath with HD.  7. HGB at goal. Continue weekly ESA.  8. HTN/volume - BP controlled, edema resolved. She is below EDW, will need to be lowered at d/c. UF as tolerated.  9. Nutrition - Low Albumin. Renal diet with fld restriction. Add protein supps.   Lilyian Quayle H. Eschol Auxier NP-C 03/01/2021, 11:36 AM  [employer] [phone]

## 2021-03-01 NOTE — Progress Notes (Signed)
4 Days Post-Op  Subjective: CC: Continued pain of the left breast. Feels this may be slightly better then over the weekend.   Objective: Vital signs in last 24 hours: Temp:  [97.3 F (36.3 C)-98 F (36.7 C)] 98 F (36.7 C) (03/07 0641) Pulse Rate:  [69-71] 70 (03/07 0641) Resp:  [16-18] 18 (03/07 0641) BP: (114-144)/(61-83) 144/80 (03/07 0641) SpO2:  [96 %-99 %] 98 % (03/07 0641) Last BM Date: 02/28/21  Intake/Output from previous day: 03/06 0701 - 03/07 0700 In: 720 [P.O.:720] Out: 0  Intake/Output this shift: No intake/output data recorded.  PE: Left Breast: Chaperone present, RN. Patient with left breast induration and faint blanchable erythema extending along the superior aspect of the breast around 10 o'clock to the 3 o'clock position (superior-medial to superior lateral). No fluctuance. No drainage. Wound at 5 o'clock position (inferior lateral) as noted below with granulation tissue at the base. No drainage. Incision along medial aspect of the breast c/d/i with sutures in place    Lab Results:  Recent Labs    02/27/21 0501 02/28/21 0409  WBC 6.6 6.8  HGB 10.1* 10.7*  HCT 33.1* 34.7*  PLT 249 267   BMET Recent Labs    02/26/21 1420 03/01/21 0404  NA 133* 134*  K 4.3 4.4  CL 90* 93*  CO2 27 24  GLUCOSE 190* 132*  BUN 63* 63*  CREATININE 12.94* 14.14*  CALCIUM 10.8* 10.3   PT/INR No results for input(s): LABPROT, INR in the last 72 hours. CMP     Component Value Date/Time   NA 134 (L) 03/01/2021 0404   K 4.4 03/01/2021 0404   CL 93 (L) 03/01/2021 0404   CO2 24 03/01/2021 0404   GLUCOSE 132 (H) 03/01/2021 0404   BUN 63 (H) 03/01/2021 0404   CREATININE 14.14 (H) 03/01/2021 0404   CALCIUM 10.3 03/01/2021 0404   PROT 6.0 (L) 01/23/2021 0426   ALBUMIN 3.3 (L) 03/01/2021 0404   AST 46 (H) 01/23/2021 0426   ALT 35 01/23/2021 0426   ALKPHOS 42 01/23/2021 0426   BILITOT 0.7 01/23/2021 0426   GFRNONAA 3 (L) 03/01/2021 0404   GFRAA 5 (L)  02/13/2019 0734   Lipase     Component Value Date/Time   LIPASE 33 07/17/2018 1145       Studies/Results: No results found.  Anti-infectives: Anti-infectives (From admission, onward)   Start     Dose/Rate Route Frequency Ordered Stop   02/27/21 1730  clindamycin (CLEOCIN) capsule 300 mg        300 mg Oral Every 8 hours 02/27/21 1631 03/03/21 1359   02/24/21 2200  amoxicillin-clavulanate (AUGMENTIN) 500-125 MG per tablet 500 mg  Status:  Discontinued        1 tablet Oral Daily at bedtime 02/23/21 1149 02/24/21 0817   02/24/21 1200  clindamycin (CLEOCIN) capsule 300 mg  Status:  Discontinued        300 mg Oral Every 6 hours 02/24/21 0817 02/24/21 0952   02/24/21 1200  clindamycin (CLEOCIN) IVPB 600 mg  Status:  Discontinued        600 mg 100 mL/hr over 30 Minutes Intravenous Every 6 hours 02/24/21 0952 02/27/21 1631   02/23/21 1245  amoxicillin-clavulanate (AUGMENTIN) 500-125 MG per tablet 500 mg        1 tablet Oral  Once 02/23/21 1149 02/23/21 1445   02/23/21 1215  amoxicillin-clavulanate (AUGMENTIN) 875-125 MG per tablet 1 tablet  Status:  Discontinued  1 tablet Oral Every 12 hours 02/23/21 1120 02/23/21 1149   01/23/21 1400  amoxicillin-clavulanate (AUGMENTIN) 500-125 MG per tablet 500 mg        500 mg Oral Every 24 hours 01/23/21 1124 01/25/21 1657   01/23/21 1015  amoxicillin-clavulanate (AUGMENTIN) 875-125 MG per tablet 1 tablet  Status:  Discontinued        1 tablet Oral Every 12 hours 01/23/21 1002 01/23/21 1124   01/20/21 0845  vancomycin (VANCOCIN) 1-5 GM/200ML-% IVPB       Note to Pharmacy: Judieth Keens  : cabinet override      01/20/21 0845 01/20/21 2059   01/18/21 1200  vancomycin (VANCOCIN) IVPB 1000 mg/200 mL premix  Status:  Discontinued        1,000 mg 200 mL/hr over 60 Minutes Intravenous Every M-W-F (Hemodialysis) 01/16/21 1017 01/21/21 1324   01/17/21 2200  Ampicillin-Sulbactam (UNASYN) 3 g in sodium chloride 0.9 % 100 mL IVPB  Status:   Discontinued        3 g 200 mL/hr over 30 Minutes Intravenous Daily at bedtime 01/16/21 1058 01/23/21 1002   01/17/21 1000  remdesivir 100 mg in sodium chloride 0.9 % 100 mL IVPB       "Followed by" Linked Group Details   100 mg 200 mL/hr over 30 Minutes Intravenous Daily 01/15/21 2357 01/18/21 0918   01/16/21 1145  Ampicillin-Sulbactam (UNASYN) 3 g in sodium chloride 0.9 % 100 mL IVPB        3 g 200 mL/hr over 30 Minutes Intravenous  Once 01/16/21 1058 01/16/21 1417   01/16/21 1100  vancomycin (VANCOREADY) IVPB 1500 mg/300 mL        1,500 mg 150 mL/hr over 120 Minutes Intravenous  Once 01/16/21 1017 01/16/21 1849   01/16/21 0300  remdesivir 200 mg in sodium chloride 0.9% 250 mL IVPB       "Followed by" Linked Group Details   200 mg 580 mL/hr over 30 Minutes Intravenous Once 01/15/21 2357 01/16/21 0543   01/15/21 2345  amoxicillin-clavulanate (AUGMENTIN) 875-125 MG per tablet 1 tablet  Status:  Discontinued        1 tablet Oral Every 12 hours 01/15/21 2334 01/15/21 2357   01/15/21 1800  vancomycin (VANCOCIN) IVPB 1000 mg/200 mL premix        1,000 mg 200 mL/hr over 60 Minutes Intravenous  Once 01/15/21 1753 01/15/21 2120       Assessment/Plan ESRD - M/W/F PAD Blind Chronic anemia Obesity BMI 40.76  RecurrentLeft breast abscess S/pIncision and drainage of left breast abscess with excisional debridement- 01/15/21 -  Dr. Zenia Resides - POD #14 S/p IR US guided aspiration - Dr. Pascal Lux- 02/24/21 - yielding 3cc of purulent appearing thick fluid. Cx's pending and NGTD.  S/p Incision and drainage of left breast wound, left breast biopsy - Dr. Zenia Resides - 02/25/21 - POD #4 -path was negative for malignancy1/21 and 3/3, although still some concern for possible inflammatory breast cancer. Will benefit from a diagnostic mammogram -Cx's pending and still with NGTD. On oral clinda and wbc normalized. Afebrile. Will see again on Wed.   ID - Oral Clinda FEN -Heparin subq VTE -SCDs, sq heparin    LOS: 45 days    Jillyn Ledger , [facility] 03/01/2021, 8:09 AM Please see Amion for pager number during day hours 7:00am-4:30pm

## 2021-03-02 DIAGNOSIS — N611 Abscess of the breast and nipple: Secondary | ICD-10-CM | POA: Diagnosis not present

## 2021-03-02 DIAGNOSIS — N186 End stage renal disease: Secondary | ICD-10-CM | POA: Diagnosis not present

## 2021-03-02 DIAGNOSIS — U071 COVID-19: Secondary | ICD-10-CM | POA: Diagnosis not present

## 2021-03-02 LAB — AEROBIC/ANAEROBIC CULTURE W GRAM STAIN (SURGICAL/DEEP WOUND): Culture: NO GROWTH

## 2021-03-02 NOTE — Progress Notes (Signed)
PT Cancellation Note  Patient Details Name: Senaida Klingler MRN: [id number] DOB: September 14, 1976   Cancelled Treatment:    Reason Eval/Treat Not Completed: Other (comment)   Pt nauseated and politely declined PT; asks that we try tomorrow after HD;   Roney Marion, [place]  Acute [facility] Services Pager [phone] Office [phone]    Colletta [place] 03/02/2021, 12:49 PM

## 2021-03-02 NOTE — Plan of Care (Signed)
  Problem: Pain Managment: Goal: General experience of comfort will improve Outcome: Progressing   Problem: Fluid Volume: Goal: Compliance with measures to maintain balanced fluid volume will improve Outcome: Progressing   

## 2021-03-02 NOTE — Progress Notes (Signed)
[facility] PROGRESS NOTE  Rebekah Burton [id number] DOB: [birth year] DOA: 01/15/2021 PCP: Trey Sailors, PA  Status:  Remains inpatient appropriate because:Unsafe d/c plan and Inpatient level of care appropriate due to severity of illness   Dispo: The patient is from: Homeless              Anticipated d/c is to: Local hotel with kitchenette features per patient request.  Patient states she has a friend who can assist her in obtaining her groceries.              Patient currently is not medically stable to d/c.  Patient has had reemergence of left breast abscess and is currently requiring regular wound care, daily visualization of wound by the surgical team and continued antibiotics.  Will need surgical clearance before can discharge    Difficult to place patient Yes   Level of care: Med-Surg  Code Status: Full Family Communication:  DVT prophylaxis: SQ Heparin Vaccination status: Unknown-Incidentally Covid positive during this admission (01/15/2021)  Foley catheter: No  HPI:  45 y.o. female with PMH significant for ESRD on HD MWF, blindness, hypertension, PAD. Patient presented to the ED on 01/15/2021 with breast abscess, underwent I&D by general surgery.  She was initially treated with IV antibiotics later completed a course with oral antibiotics.  She was also found to be incidental Covid positive. Now she is off contact precautions. 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 IR and vascular were consulted. aVF is now working.Dialysis per nephrology.   Subjective: Awake and alert.  Continues to report significant pain in the left breast.  Denies significant drainage.  Objective: Vitals:   03/01/21 2039 03/02/21 0606  BP: 113/77 116/61  Pulse: 78 73  Resp: 16 18  Temp: 99.1 F (37.3 C) 98 F (36.7 C)  SpO2: 97% 100%    Intake/Output Summary (Last 24 hours) at  03/02/2021 [id number] Last data filed at 03/02/2021 0700 Gross per 24 hour  Intake 300 ml  Output 2300 ml  Net -2000 ml   Filed Weights   02/26/21 1640 03/01/21 1200 03/01/21 1545  Weight: 99 kg 101.3 kg 98 kg    Exam:  Constitutional: Awake alert, mild distress as evidenced by ongoing left breast pain Breast: Left breast with marked reduction in edema and induration and no longer erythematous. Respiratory: Lungs are clear to auscultation anteriorly, she remained stable on room air Cardiovascular: Normal heart sounds, pulse regular, no peripheral edema, history of bilateral transmetatarsal amputations. Abdomen: Soft nontender nondistended with bowel sounds present.  Tolerating diet.  LBM 3/8 Neurologic: CN 2-12 grossly intact except for known blindness/visual impairment. Sensation intact, DTR normal. Strength 5/5 x all 4 extremities.  Psychiatric: Awake and alert, oriented x3.  Pleasant affect   Assessment/Plan: Acute problems: Breast abscess/recurrent -s/p incision drainage done by general surgery.-Pathology without any evidence of malignancy -She was started on Unasyn, cultures grew gram-positive cocci. She was transitioned to Augmentin and completed recommended duration of therapy -On 3/1 reemergent breast abscess.  General surgery consulted and patient was resumed on Augmentin -3/2 US guided aspiration of complex fluid collection yielded minimal fluid returns -3/3 underwent intraoperative surgical debridement as well as biopsy to rule out underlying malignancy-pathology negative for malignancy and cultures negative for growth -Continue OxyIR 15 mg every 4 hours as needed plus Tylenol 325 mg every 4 hours as needed - continue IV morphine prn for breakthrough pain  Incidental COVID-19 -patient is asymptomatic, no evidence of pulmonary involvement.  -Off isolation.Currently on room air.  ESRD -nephrology was consulted, had issues with cannulation of aVF. IR and vascular was  consulted and underwent fistulogram demonstrated pseudoaneurysm.  -AVF is now working.  -Dialysis per nephrology.  -Continue sevelamer.  Anemia of chronic disease -monitor intermittently, hemoglobin is stable at 9.1. Continue Aranesp.   Other problems:  Hyponatremia -patient on hemodialysis, nephrology following. Last Labs         Recent Labs  Lab 02/16/21 1204 02/17/21 1312 02/19/21 0837 02/22/21 0852  NA 132* 134* 135 133*     Hypertension -Blood pressure controlled on clonidine and Coreg.  Peripheral artery disease -hx of Fem-pop bypass. -Has history of left transmetatarsal amputation.    Data Reviewed: Basic Metabolic Panel: Recent Labs  Lab 02/24/21 0742 02/25/21 1042 02/26/21 1420 03/01/21 0404  NA 137 134* 133* 134*  K 3.7 4.3 4.3 4.4  CL 100 100 90* 93*  CO2 23  --  27 24  GLUCOSE 112* 112* 190* 132*  BUN 47* 45* 63* 63*  CREATININE 9.23* 10.50* 12.94* 14.14*  CALCIUM 9.8  --  10.8* 10.3  PHOS 3.6  --  4.5 6.2*   Liver Function Tests: Recent Labs  Lab 02/24/21 0742 02/26/21 1420 03/01/21 0404  ALBUMIN 3.0* 3.4* 3.3*   No results for input(s): LIPASE, AMYLASE in the last 168 hours. No results for input(s): AMMONIA in the last 168 hours. CBC: Recent Labs  Lab 02/23/21 1028 02/24/21 0742 02/25/21 1042 02/26/21 1408 02/27/21 0501 02/28/21 0409  WBC 7.3 5.6  --  9.6 6.6 6.8  NEUTROABS 5.8  --   --   --   --   --   HGB 9.8* 9.6* 12.6 10.0* 10.1* 10.7*  HCT 32.2* 30.5* 37.0 32.3* 33.1* 34.7*  MCV 99.4 98.4  --  98.5 99.7 98.6  PLT 247 231  --  277 249 267   Cardiac Enzymes: No results for input(s): CKTOTAL, CKMB, CKMBINDEX, TROPONINI in the last 168 hours. BNP (last 3 results) Recent Labs    01/18/21 0136 01/19/21 0030 01/20/21 0020  BNP 1,410.7* 1,122.4* 1,383.7*    ProBNP (last 3 results) No results for input(s): PROBNP in the last 8760 hours.  CBG: No results for input(s): GLUCAP in the last 168 hours.  Recent  Results (from the past 240 hour(s))  Aerobic/Anaerobic Culture w Gram Stain (surgical/deep wound)     Status: None (Preliminary result)   Collection Time: 02/24/21  4:57 PM   Specimen: Abscess  Result Value Ref Range Status   Specimen Description ABSCESS BREAST  Final   Special Requests NONE  Final   Gram Stain   Final    ABUNDANT WBC PRESENT, PREDOMINANTLY PMN NO ORGANISMS SEEN    Culture   Final    CULTURE REINCUBATED FOR BETTER GROWTH Performed at [facility], [address] [place], [place] [address]    Report Status PENDING  Incomplete  Surgical pcr screen     Status: None   Collection Time: 02/25/21  9:15 AM   Specimen: Nasal Mucosa; Nasal Swab  Result Value Ref Range Status   MRSA, PCR NEGATIVE NEGATIVE Final   Staphylococcus aureus NEGATIVE NEGATIVE Final    Comment: (NOTE) The Xpert SA Assay (FDA approved for NASAL specimens in patients 47 years of age and older), is one component of a comprehensive surveillance program. It is not intended to diagnose infection nor to guide or monitor treatment. Performed  at [facility], [place] [address], [place], [place] [address]   Aerobic/Anaerobic Culture w Gram Stain (surgical/deep wound)     Status: None (Preliminary result)   Collection Time: 02/25/21 12:42 PM   Specimen: PATH Cytology Misc. fluid; Body Fluid  Result Value Ref Range Status   Specimen Description BREAST  Final   Special Requests LEFT  Final   Gram Stain   Final    ABUNDANT WBC PRESENT,BOTH PMN AND MONONUCLEAR NO ORGANISMS SEEN    Culture   Final    NO GROWTH 4 DAYS NO ANAEROBES ISOLATED; CULTURE IN PROGRESS FOR 5 DAYS Performed at [facility], [address] [place], [place] [address]    Report Status PENDING  Incomplete     Studies: No results found.  Scheduled Meds: . (feeding supplement) PROSource Plus  30 mL Oral BID BM  . carvedilol  25 mg Oral BID WC  . Chlorhexidine Gluconate Cloth  6 each Topical Q0600  . clindamycin   300 mg Oral Q8H  . cloNIDine  0.3 mg Oral TID  . darbepoetin (ARANESP) injection - DIALYSIS  150 mcg Intravenous Q Mon-HD  . heparin  5,000 Units Subcutaneous Q8H  . multivitamin  1 tablet Oral QHS  . senna  1 tablet Oral BID  . sevelamer carbonate  3,200 mg Oral TID WC   Continuous Infusions: . sodium chloride    . sodium chloride 10 mL/hr at 02/27/21 [id number]    Active Problems:   ESRD (end stage renal disease) ([place])   Breast abscess   COVID-19 virus infection   Breast abscess of female   Left breast abscess   Poor social situation   Consultants:  Nephrology  General surgery  Interventional radiology  Procedures:  Aspiration of breast abscess per IR 3/1 with further image guided aspiration of complex breast abscess planned for 3/2  Antibiotics: Anti-infectives (From admission, onward)   Start     Dose/Rate Route Frequency Ordered Stop   02/27/21 1730  clindamycin (CLEOCIN) capsule 300 mg        300 mg Oral Every 8 hours 02/27/21 1631 03/03/21 1359   02/24/21 2200  amoxicillin-clavulanate (AUGMENTIN) 500-125 MG per tablet 500 mg  Status:  Discontinued        1 tablet Oral Daily at bedtime 02/23/21 1149 02/24/21 0817   02/24/21 1200  clindamycin (CLEOCIN) capsule 300 mg  Status:  Discontinued        300 mg Oral Every 6 hours 02/24/21 0817 02/24/21 0952   02/24/21 1200  clindamycin (CLEOCIN) IVPB 600 mg  Status:  Discontinued        600 mg 100 mL/hr over 30 Minutes Intravenous Every 6 hours 02/24/21 0952 02/27/21 1631   02/23/21 1245  amoxicillin-clavulanate (AUGMENTIN) 500-125 MG per tablet 500 mg        1 tablet Oral  Once 02/23/21 1149 02/23/21 1445   02/23/21 1215  amoxicillin-clavulanate (AUGMENTIN) 875-125 MG per tablet 1 tablet  Status:  Discontinued        1 tablet Oral Every 12 hours 02/23/21 1120 02/23/21 1149   01/23/21 1400  amoxicillin-clavulanate (AUGMENTIN) 500-125 MG per tablet 500 mg        500 mg Oral Every 24 hours 01/23/21 1124 01/25/21 1657    01/23/21 1015  amoxicillin-clavulanate (AUGMENTIN) 875-125 MG per tablet 1 tablet  Status:  Discontinued        1 tablet Oral Every 12 hours 01/23/21 1002 01/23/21 1124   01/20/21 0845  vancomycin (VANCOCIN) 1-5 GM/200ML-% IVPB       Note to Pharmacy: Judieth Keens  : cabinet override      01/20/21 0845 01/20/21 2059   01/18/21 1200  vancomycin (VANCOCIN) IVPB 1000 mg/200 mL premix  Status:  Discontinued        1,000 mg 200 mL/hr over 60 Minutes Intravenous Every M-W-F (Hemodialysis) 01/16/21 1017 01/21/21 1324   01/17/21 2200  Ampicillin-Sulbactam (UNASYN) 3 g in sodium chloride 0.9 % 100 mL IVPB  Status:  Discontinued        3 g 200 mL/hr over 30 Minutes Intravenous Daily at bedtime 01/16/21 1058 01/23/21 1002   01/17/21 1000  remdesivir 100 mg in sodium chloride 0.9 % 100 mL IVPB       "Followed by" Linked Group Details   100 mg 200 mL/hr over 30 Minutes Intravenous Daily 01/15/21 2357 01/18/21 0918   01/16/21 1145  Ampicillin-Sulbactam (UNASYN) 3 g in sodium chloride 0.9 % 100 mL IVPB        3 g 200 mL/hr over 30 Minutes Intravenous  Once 01/16/21 1058 01/16/21 1417   01/16/21 1100  vancomycin (VANCOREADY) IVPB 1500 mg/300 mL        1,500 mg 150 mL/hr over 120 Minutes Intravenous  Once 01/16/21 1017 01/16/21 1849   01/16/21 0300  remdesivir 200 mg in sodium chloride 0.9% 250 mL IVPB       "Followed by" Linked Group Details   200 mg 580 mL/hr over 30 Minutes Intravenous Once 01/15/21 2357 01/16/21 0543   01/15/21 2345  amoxicillin-clavulanate (AUGMENTIN) 875-125 MG per tablet 1 tablet  Status:  Discontinued        1 tablet Oral Every 12 hours 01/15/21 2334 01/15/21 2357   01/15/21 1800  vancomycin (VANCOCIN) IVPB 1000 mg/200 mL premix        1,000 mg 200 mL/hr over 60 Minutes Intravenous  Once 01/15/21 1753 01/15/21 2120       Time spent: 20 minutes    Erin Hearing ANP  [facility] 7 am - 330 pm/M-F for direct patient care and secure chat Please refer to Amion  for contact info 46  days

## 2021-03-02 NOTE — Progress Notes (Addendum)
1:30pm: CSW returned to patient's bedside to inquire about her being on the housing waiting list - patient reports she is on the Section 8 waiting list not public housing. CSW purposed the idea of residing in a boarding house, patient is not agreeable to a boarding house. Patient is agreeable for CSW to obtain quotes from extended stay hotels in the area on her behalf.  8am: CSW left additional voicemail for [employer] staff to return call.  CSW spoke with patient at bedside to have a discussion regarding her discharge plan. Patient reports she does not want to go to an ALF or facility but prefers going to a hotel where she can remain independent. Patient reports that she has a friend that will assist her in getting groceries. Patient reports she is able to prepare her own meals. Patient states she does not receive food stamps. Patient reports she utilizes  [facility] transportation to get to her dialysis treatments. Patient's HD clinic is [facility] - she goes MWF. Patient reports she gets her medications via mail from [facility]. Patient reports she manages her own money and does not have a payee. Patient reports her former roommate and friend will be returning from [place] at some point this week - at that time, patient will have access to her debit card and can obtain shelter at a hotel.  Madilyn Fireman, MSW, LCSW Transitions of Care  Clinical Social Worker II [phone]

## 2021-03-02 NOTE — Progress Notes (Addendum)
[facility] Progress Note   Subjective: Seen in room. Sleeping during day as she has had issues sleeping at night D/T pain in L breast after resurgence of breast abscess.    Objective Vitals:   03/01/21 1900 03/01/21 2039 03/02/21 0606 03/02/21 0941  BP: 129/62 113/77 116/61 118/61  Pulse: 72 78 73 71  Resp: '18 16 18 18  '$ Temp: 98.6 F (37 C) 99.1 F (37.3 C) 98 F (36.7 C) 98.3 F (36.8 C)  TempSrc: Oral Oral Oral Oral  SpO2: 98% 97% 100% 100%  Weight:      Height:       Physical Exam General: obese female in NAD Heart: S1,S2, RRR Lungs: CTAB Abdomen: Active BS Extremities: No LE edema.  Dialysis Access: L AVF + bruit    Additional Objective Labs: Basic Metabolic Panel: Recent Labs  Lab 02/24/21 0742 02/25/21 1042 02/26/21 1420 03/01/21 0404  NA 137 134* 133* 134*  K 3.7 4.3 4.3 4.4  CL 100 100 90* 93*  CO2 23  --  27 24  GLUCOSE 112* 112* 190* 132*  BUN 47* 45* 63* 63*  CREATININE 9.23* 10.50* 12.94* 14.14*  CALCIUM 9.8  --  10.8* 10.3  PHOS 3.6  --  4.5 6.2*   Liver Function Tests: Recent Labs  Lab 02/24/21 0742 02/26/21 1420 03/01/21 0404  ALBUMIN 3.0* 3.4* 3.3*   No results for input(s): LIPASE, AMYLASE in the last 168 hours. CBC: Recent Labs  Lab 02/24/21 0742 02/25/21 1042 02/26/21 1408 02/27/21 0501 02/28/21 0409  WBC 5.6  --  9.6 6.6 6.8  HGB 9.6*   < > 10.0* 10.1* 10.7*  HCT 30.5*   < > 32.3* 33.1* 34.7*  MCV 98.4  --  98.5 99.7 98.6  PLT 231  --  277 249 267   < > = values in this interval not displayed.   Blood Culture    Component Value Date/Time   SDES BREAST 02/25/2021 1242   SPECREQUEST LEFT 02/25/2021 1242   CULT  02/25/2021 1242    NO GROWTH 4 DAYS NO ANAEROBES ISOLATED; CULTURE IN PROGRESS FOR 5 DAYS Performed at [facility], [place] [address], [place],  [address]    REPTSTATUS PENDING 02/25/2021 1242    Cardiac Enzymes: No results for input(s): CKTOTAL, CKMB, CKMBINDEX, TROPONINI in  the last 168 hours. CBG: No results for input(s): GLUCAP in the last 168 hours. Iron Studies: No results for input(s): IRON, TIBC, TRANSFERRIN, FERRITIN in the last 72 hours. '@lablastinr3'$ @ Studies/Results: No results found. Medications: . sodium chloride    . sodium chloride 10 mL/hr at 02/27/21 0831   . (feeding supplement) PROSource Plus  30 mL Oral BID BM  . carvedilol  25 mg Oral BID WC  . Chlorhexidine Gluconate Cloth  6 each Topical Q0600  . clindamycin  300 mg Oral Q8H  . cloNIDine  0.3 mg Oral TID  . darbepoetin (ARANESP) injection - DIALYSIS  150 mcg Intravenous Q Mon-HD  . heparin  5,000 Units Subcutaneous Q8H  . multivitamin  1 tablet Oral QHS  . senna  1 tablet Oral BID  . sevelamer carbonate  3,200 mg Oral TID WC     Dialysis Orders: [place] 4h 29mn 2/2.5 bath 105kg AVF  parsabiv 15 mg IV three times a week with HD mircera 150 mcg IV every 2 weeks - last given 01/13/21 hectorol 3 mcg IV three times a week  Assessment/Plan: 1. Left breast abscess -admit team and CCS  managing, status post I&D abscess 01/15/2021 and 02/25/21 pathology was negative for malignancy on 01/21"although some concern for possible inflammatory breast cancer"repeat biopsy done 03/03. Now on oral clindamycin.  2. HD vascular access: Recurrentcannulationissuesw/AVF.IR completed f'gram 01/25/21 showing significant pseudoaneurysm s/p thrombin injection and near complete thrombosis of pseudoaneurysm per IR.Cannulation issues now appear to have resolved, will continue to monitor. 3. Covid 19 on admission. Now out of isolation window.  5. ESRD:  MWF. Next HD 03/02/21 No heparin.  6. Secondary hyperparathyroidism - PO4 slightly elevated. C Ca high. Reminded to take binders. Use 2.0 Ca bath with HD.  7. HGB at goal. Continue weekly ESA.  8. HTN/volume - BP controlled, edema resolved. She is below EDW, will need to be lowered at d/c. Reset EDW to 98 kg.UF as tolerated.  9. Nutrition -  Low Albumin. Renal diet with fld restriction. Add protein supps.   Disposition: Now plan is for hotel when medically stable. She does not wish to go to ALF/SNF although I believe the latter options would be a safer choice. Appreciate MSW's assistance.   Anita Mcadory H. Rahima Fleishman NP-C 03/02/2021, 11:30 AM  [employer] [phone]

## 2021-03-03 DIAGNOSIS — U071 COVID-19: Secondary | ICD-10-CM | POA: Diagnosis not present

## 2021-03-03 DIAGNOSIS — N611 Abscess of the breast and nipple: Secondary | ICD-10-CM | POA: Diagnosis not present

## 2021-03-03 DIAGNOSIS — N186 End stage renal disease: Secondary | ICD-10-CM | POA: Diagnosis not present

## 2021-03-03 LAB — AEROBIC/ANAEROBIC CULTURE W GRAM STAIN (SURGICAL/DEEP WOUND): Culture: NEGATIVE

## 2021-03-03 LAB — CBC
HCT: 32.3 % — ABNORMAL LOW (ref 36.0–46.0)
Hemoglobin: 10 g/dL — ABNORMAL LOW (ref 12.0–15.0)
MCH: 30.8 pg (ref 26.0–34.0)
MCHC: 31 g/dL (ref 30.0–36.0)
MCV: 99.4 fL (ref 80.0–100.0)
Platelets: 222 10*3/uL (ref 150–400)
RBC: 3.25 MIL/uL — ABNORMAL LOW (ref 3.87–5.11)
RDW: 18.1 % — ABNORMAL HIGH (ref 11.5–15.5)
WBC: 5.3 10*3/uL (ref 4.0–10.5)
nRBC: 0 % (ref 0.0–0.2)

## 2021-03-03 LAB — RENAL FUNCTION PANEL
Albumin: 3.1 g/dL — ABNORMAL LOW (ref 3.5–5.0)
Anion gap: 15 (ref 5–15)
BUN: 42 mg/dL — ABNORMAL HIGH (ref 6–20)
CO2: 26 mmol/L (ref 22–32)
Calcium: 10.4 mg/dL — ABNORMAL HIGH (ref 8.9–10.3)
Chloride: 93 mmol/L — ABNORMAL LOW (ref 98–111)
Creatinine, Ser: 12.14 mg/dL — ABNORMAL HIGH (ref 0.44–1.00)
GFR, Estimated: 4 mL/min — ABNORMAL LOW (ref >60)
Glucose, Bld: 156 mg/dL — ABNORMAL HIGH (ref 70–99)
Phosphorus: 6.2 mg/dL — ABNORMAL HIGH (ref 2.5–4.6)
Potassium: 3.9 mmol/L (ref 3.5–5.1)
Sodium: 134 mmol/L — ABNORMAL LOW (ref 135–145)

## 2021-03-03 MED ORDER — MORPHINE SULFATE (PF) 2 MG/ML IV SOLN
INTRAVENOUS | Status: AC
  Filled 2021-03-03: qty 1

## 2021-03-03 NOTE — Plan of Care (Signed)
  Problem: Pain Managment: Goal: General experience of comfort will improve Outcome: Progressing   Problem: Fluid Volume: Goal: Compliance with measures to maintain balanced fluid volume will improve Outcome: Progressing   

## 2021-03-03 NOTE — Progress Notes (Signed)
[facility] Progress Note   Subjective: Seen on HD, talking with MSW. No C/Os. No issues on HD.   Objective Vitals:   03/03/21 0511 03/03/21 0834 03/03/21 0845 03/03/21 0900  BP: (!) 110/92 120/73 124/78 (!) 141/86  Pulse: 74 73  72  Resp: 19 20    Temp: 97.7 F (36.5 C) 98.7 F (37.1 C)    TempSrc:  Oral    SpO2: 99% 98%    Weight:  98.8 kg    Height:       Physical Exam General:obese female in NAD Heart:S1,S2, RRR Lungs:CTAB Abdomen:Active BS Extremities:No LE edema. Dialysis Access:L AVF + bruit   Additional Objective Labs: Basic Metabolic Panel: Recent Labs  Lab 02/25/21 1042 02/26/21 1420 03/01/21 0404  NA 134* 133* 134*  K 4.3 4.3 4.4  CL 100 90* 93*  CO2  --  27 24  GLUCOSE 112* 190* 132*  BUN 45* 63* 63*  CREATININE 10.50* 12.94* 14.14*  CALCIUM  --  10.8* 10.3  PHOS  --  4.5 6.2*   Liver Function Tests: Recent Labs  Lab 02/26/21 1420 03/01/21 0404  ALBUMIN 3.4* 3.3*   No results for input(s): LIPASE, AMYLASE in the last 168 hours. CBC: Recent Labs  Lab 02/26/21 1408 02/27/21 0501 02/28/21 0409  WBC 9.6 6.6 6.8  HGB 10.0* 10.1* 10.7*  HCT 32.3* 33.1* 34.7*  MCV 98.5 99.7 98.6  PLT 277 249 267   Blood Culture    Component Value Date/Time   SDES BREAST 02/25/2021 1242   SPECREQUEST LEFT 02/25/2021 1242   CULT  02/25/2021 1242    No growth aerobically or anaerobically. Performed at [facility], [place] [address] [place],  [address]    REPTSTATUS 03/02/2021 FINAL 02/25/2021 1242    Cardiac Enzymes: No results for input(s): CKTOTAL, CKMB, CKMBINDEX, TROPONINI in the last 168 hours. CBG: No results for input(s): GLUCAP in the last 168 hours. Iron Studies: No results for input(s): IRON, TIBC, TRANSFERRIN, FERRITIN in the last 72 hours. '@lablastinr3'$ @ Studies/Results: No results found. Medications: . sodium chloride    . sodium chloride 10 mL/hr at 02/27/21 0831   . (feeding supplement)  PROSource Plus  30 mL Oral BID BM  . carvedilol  25 mg Oral BID WC  . Chlorhexidine Gluconate Cloth  6 each Topical Q0600  . cloNIDine  0.3 mg Oral TID  . darbepoetin (ARANESP) injection - DIALYSIS  150 mcg Intravenous Q Mon-HD  . heparin  5,000 Units Subcutaneous Q8H  . morphine      . multivitamin  1 tablet Oral QHS  . senna  1 tablet Oral BID  . sevelamer carbonate  3,200 mg Oral TID WC     Dialysis Orders: [place] 4h 38mn 2/2.5 bath 105kg AVF  parsabiv 15 mgIVthree times a week with HD mircera 150 mcgIVevery 2 weeks - last given 01/13/21 hectorol 3 mcgIVthree times a week  Assessment/Plan: 1.Left breast abscess -admit team and CCS managing, status post I&D abscess 01/15/2021 and 02/25/21 pathology was negative for malignancy on 01/21"although some concern for possible inflammatory breast cancer"repeat biopsy done 03/03. Now on oral clindamycin. 2.HD vascular access: Recurrentcannulationissuesw/AVF.IR completed f'gram 01/25/21 showing significant pseudoaneurysm s/p thrombin injection and near complete thrombosis of pseudoaneurysm per IR.Cannulation issues now appear to have resolved, will continue to monitor. 3.Covid 19 on admission. Now out of isolation window.  5. ESRD: MWF. HD today. No heparin. K+ 4.4.  6. Secondary hyperparathyroidism -PO4 slightly elevated. C Ca high. Reminded  to take binders. Use 2.0 Ca bath with HD.  7. HGB at goal. Continue weekly ESA.  8. HTN/volume -BP controlled, edema resolved. She isbelow EDW, will need to be lowered at d/c. Reset EDW to 98 kg.UF as tolerated.  9. Nutrition -Low Albumin. Renal diet with fld restriction. Add protein supps.  Disposition: Now plan is for hotel when medically stable. She does not wish to go to ALF/SNF although I believe the latter options would be a safer choice. Appreciate MSW's assistance.   Rita H. Brown NP-C 03/03/2021, 9:26 AM  [employer] [phone]

## 2021-03-03 NOTE — Progress Notes (Signed)
[facility] PROGRESS NOTE  Rebekah Burton [id number] DOB: 03-Apr-1976 DOA: 01/15/2021 PCP: Trey Sailors, PA  Status: Remains inpatient appropriate because:Unsafe d/c plan and Inpatient level of care appropriate due to severity of illness   Dispo: The patient is from: Homeless              Anticipated d/c is to: Local hotel with kitchenette features per patient request.  Patient states she has a friend who can assist her in obtaining her groceries.  Will not have access to her monthly check until 3/11.  That time she will be able to obtain $800.  LCSW has contacted [employer] and they report monthly rental will be $650.  LCSW also assisting patient with obtaining food stamps.  In addition plan is to discharge on nondialysis day.              Patient currently is medically stable to d/c.    Difficult to place patient Yes   Level of care: Med-Surg  Code Status: Full Family Communication:  DVT prophylaxis: SQ Heparin Vaccination status: Unknown-Incidentally Covid positive during this admission (01/15/2021)  Foley catheter: No  HPI:  45 y.o. female with PMH significant for ESRD on HD MWF, blindness, hypertension, PAD. Patient presented to the ED on 01/15/2021 with breast abscess, underwent I&D by general surgery.  She was initially treated with IV antibiotics later completed a course with oral antibiotics.  She was also found to be incidental Covid positive. Now she is off contact precautions. 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 IR and vascular were consulted. aVF is now working.Dialysis per nephrology.   Subjective: Awake.  Jovial.  Reiterated that she does not wish to be placed in a facility and prefers to be independent.  Made aware that her preferences for motel placement are too expensive with the average cost being about $1500 per month.  She was in agreement to  taking a room at [facility] and once her funding becomes available.  Objective: Vitals:   03/02/21 2155 03/03/21 0511  BP: 124/62 (!) 110/92  Pulse: 66 74  Resp: 16 19  Temp: 97.8 F (36.6 C) 97.7 F (36.5 C)  SpO2: 97% 99%    Intake/Output Summary (Last 24 hours) at 03/03/2021 [id number] Last data filed at 03/03/2021 0601 Gross per 24 hour  Intake 1080 ml  Output 0 ml  Net 1080 ml   Filed Weights   02/26/21 1640 03/01/21 1200 03/01/21 1545  Weight: 99 kg 101.3 kg 98 kg    Exam:  Constitutional: Calm, no acute distress Breast: Left breast remains tender to minimal palpation but soft without any redness or induration Respiratory: Lung sounds are clear and she remained stable on room air Cardiovascular: Heart sounds are normal, during my time visiting her in hemodialysis she was hemodynamically stable Abdomen: Soft nontender with bowel sounds present.  Eating well. LBM 3/8 Neurologic: CN 2-12 grossly intact except for known blindness/visual impairment. Sensation intact, DTR normal. Strength 5/5 x all 4 extremities.  Ext: Prior bilateral transmetatarsal amputations Psychiatric: Alert and oriented x3.  Pleasant affect   Assessment/Plan: Acute problems: Breast abscess/recurrent -s/p incision drainage done by general surgery.-Pathology without any evidence of malignancy -She was started on Unasyn, cultures grew gram-positive cocci. She was transitioned to Augmentin and completed recommended duration of therapy -On 3/1 reemergent breast abscess.  General surgery consulted and patient was resumed on Augmentin -3/2 US guided aspiration  of complex fluid collection yielded minimal fluid returns -3/3 underwent intraoperative surgical debridement as well as biopsy to rule out underlying malignancy-pathology negative for malignancy and cultures negative for growth; on 3/8 surgery cleared from a surgical standpoint for discharge. -Continue OxyIR 15 mg every 4 hours as needed plus Tylenol 325 mg  every 4 hours as needed - continue IV morphine prn for breakthrough pain  Incidental COVID-19 -patient is asymptomatic, no evidence of pulmonary involvement.  -Off isolation.Currently on room air.  ESRD -nephrology was consulted, had issues with cannulation of aVF. IR and vascular was consulted and underwent fistulogram demonstrated pseudoaneurysm.  -AVF is now working.  -Dialysis per nephrology.  -Continue sevelamer.  Anemia of chronic disease -monitor intermittently, hemoglobin is stable at 9.1. Continue Aranesp.   Other problems: Hyponatremia -patient on hemodialysis, nephrology following. Last Labs         Recent Labs  Lab 02/16/21 1204 02/17/21 1312 02/19/21 0837 02/22/21 0852  NA 132* 134* 135 133*     Hypertension -Blood pressure controlled on clonidine and Coreg.  Peripheral artery disease -hx of Fem-pop bypass. -Has history of left transmetatarsal amputation.    Data Reviewed: Basic Metabolic Panel: Recent Labs  Lab 02/25/21 1042 02/26/21 1420 03/01/21 0404  NA 134* 133* 134*  K 4.3 4.3 4.4  CL 100 90* 93*  CO2  --  27 24  GLUCOSE 112* 190* 132*  BUN 45* 63* 63*  CREATININE 10.50* 12.94* 14.14*  CALCIUM  --  10.8* 10.3  PHOS  --  4.5 6.2*   Liver Function Tests: Recent Labs  Lab 02/26/21 1420 03/01/21 0404  ALBUMIN 3.4* 3.3*   No results for input(s): LIPASE, AMYLASE in the last 168 hours. No results for input(s): AMMONIA in the last 168 hours. CBC: Recent Labs  Lab 02/25/21 1042 02/26/21 1408 02/27/21 0501 02/28/21 0409  WBC  --  9.6 6.6 6.8  HGB 12.6 10.0* 10.1* 10.7*  HCT 37.0 32.3* 33.1* 34.7*  MCV  --  98.5 99.7 98.6  PLT  --  277 249 267   Cardiac Enzymes: No results for input(s): CKTOTAL, CKMB, CKMBINDEX, TROPONINI in the last 168 hours. BNP (last 3 results) Recent Labs    01/18/21 0136 01/19/21 0030 01/20/21 0020  BNP 1,410.7* 1,122.4* 1,383.7*    ProBNP (last 3 results) No results for input(s):  PROBNP in the last 8760 hours.  CBG: No results for input(s): GLUCAP in the last 168 hours.  Recent Results (from the past 240 hour(s))  Aerobic/Anaerobic Culture w Gram Stain (surgical/deep wound)     Status: None (Preliminary result)   Collection Time: 02/24/21  4:57 PM   Specimen: Abscess  Result Value Ref Range Status   Specimen Description ABSCESS BREAST  Final   Special Requests NONE  Final   Gram Stain   Final    ABUNDANT WBC PRESENT, PREDOMINANTLY PMN NO ORGANISMS SEEN    Culture   Final    FEW FINEGOLDIA MAGNA CULTURE REINCUBATED FOR BETTER GROWTH Standardized susceptibility testing for this organism is not available. Performed at [facility], [place] [address] [place], [place] [address]    Report Status PENDING  Incomplete  Surgical pcr screen     Status: None   Collection Time: 02/25/21  9:15 AM   Specimen: Nasal Mucosa; Nasal Swab  Result Value Ref Range Status   MRSA, PCR NEGATIVE NEGATIVE Final   Staphylococcus aureus NEGATIVE NEGATIVE Final    Comment: (NOTE) The Xpert SA Assay (FDA approved for  NASAL specimens in patients 75 years of age and older), is one component of a comprehensive surveillance program. It is not intended to diagnose infection nor to guide or monitor treatment. Performed at [facility], [place] [address] [place], [place] [address]   Aerobic/Anaerobic Culture w Gram Stain (surgical/deep wound)     Status: None   Collection Time: 02/25/21 12:42 PM   Specimen: PATH Cytology Misc. fluid; Body Fluid  Result Value Ref Range Status   Specimen Description BREAST  Final   Special Requests LEFT  Final   Gram Stain   Final    ABUNDANT WBC PRESENT,BOTH PMN AND MONONUCLEAR NO ORGANISMS SEEN    Culture   Final    No growth aerobically or anaerobically. Performed at [facility], [place] [address] [place], [place] [address]    Report Status 03/02/2021 FINAL  Final     Studies: No results found.  Scheduled Meds: . (feeding  supplement) PROSource Plus  30 mL Oral BID BM  . carvedilol  25 mg Oral BID WC  . Chlorhexidine Gluconate Cloth  6 each Topical Q0600  . cloNIDine  0.3 mg Oral TID  . darbepoetin (ARANESP) injection - DIALYSIS  150 mcg Intravenous Q Mon-HD  . heparin  5,000 Units Subcutaneous Q8H  . multivitamin  1 tablet Oral QHS  . senna  1 tablet Oral BID  . sevelamer carbonate  3,200 mg Oral TID WC   Continuous Infusions: . sodium chloride    . sodium chloride 10 mL/hr at 02/27/21 [id number]    Active Problems:   ESRD (end stage renal disease) ([place])   Breast abscess   COVID-19 virus infection   Breast abscess of female   Left breast abscess   Poor social situation   Consultants:  Nephrology  General surgery  Interventional radiology  Procedures:  Aspiration of breast abscess per IR 3/1 with further image guided aspiration of complex breast abscess planned for 3/2  Antibiotics: Anti-infectives (From admission, onward)   Start     Dose/Rate Route Frequency Ordered Stop   02/27/21 1730  clindamycin (CLEOCIN) capsule 300 mg        300 mg Oral Every 8 hours 02/27/21 1631 03/03/21 0511   02/24/21 2200  amoxicillin-clavulanate (AUGMENTIN) 500-125 MG per tablet 500 mg  Status:  Discontinued        1 tablet Oral Daily at bedtime 02/23/21 1149 02/24/21 0817   02/24/21 1200  clindamycin (CLEOCIN) capsule 300 mg  Status:  Discontinued        300 mg Oral Every 6 hours 02/24/21 0817 02/24/21 0952   02/24/21 1200  clindamycin (CLEOCIN) IVPB 600 mg  Status:  Discontinued        600 mg 100 mL/hr over 30 Minutes Intravenous Every 6 hours 02/24/21 0952 02/27/21 1631   02/23/21 1245  amoxicillin-clavulanate (AUGMENTIN) 500-125 MG per tablet 500 mg        1 tablet Oral  Once 02/23/21 1149 02/23/21 1445   02/23/21 1215  amoxicillin-clavulanate (AUGMENTIN) 875-125 MG per tablet 1 tablet  Status:  Discontinued        1 tablet Oral Every 12 hours 02/23/21 1120 02/23/21 1149   01/23/21 1400   amoxicillin-clavulanate (AUGMENTIN) 500-125 MG per tablet 500 mg        500 mg Oral Every 24 hours 01/23/21 1124 01/25/21 1657   01/23/21 1015  amoxicillin-clavulanate (AUGMENTIN) 875-125 MG per tablet 1 tablet  Status:  Discontinued  1 tablet Oral Every 12 hours 01/23/21 1002 01/23/21 1124   01/20/21 0845  vancomycin (VANCOCIN) 1-5 GM/200ML-% IVPB       Note to Pharmacy: Judieth Keens  : cabinet override      01/20/21 0845 01/20/21 2059   01/18/21 1200  vancomycin (VANCOCIN) IVPB 1000 mg/200 mL premix  Status:  Discontinued        1,000 mg 200 mL/hr over 60 Minutes Intravenous Every M-W-F (Hemodialysis) 01/16/21 1017 01/21/21 1324   01/17/21 2200  Ampicillin-Sulbactam (UNASYN) 3 g in sodium chloride 0.9 % 100 mL IVPB  Status:  Discontinued        3 g 200 mL/hr over 30 Minutes Intravenous Daily at bedtime 01/16/21 1058 01/23/21 1002   01/17/21 1000  remdesivir 100 mg in sodium chloride 0.9 % 100 mL IVPB       "Followed by" Linked Group Details   100 mg 200 mL/hr over 30 Minutes Intravenous Daily 01/15/21 2357 01/18/21 0918   01/16/21 1145  Ampicillin-Sulbactam (UNASYN) 3 g in sodium chloride 0.9 % 100 mL IVPB        3 g 200 mL/hr over 30 Minutes Intravenous  Once 01/16/21 1058 01/16/21 1417   01/16/21 1100  vancomycin (VANCOREADY) IVPB 1500 mg/300 mL        1,500 mg 150 mL/hr over 120 Minutes Intravenous  Once 01/16/21 1017 01/16/21 1849   01/16/21 0300  remdesivir 200 mg in sodium chloride 0.9% 250 mL IVPB       "Followed by" Linked Group Details   200 mg 580 mL/hr over 30 Minutes Intravenous Once 01/15/21 2357 01/16/21 0543   01/15/21 2345  amoxicillin-clavulanate (AUGMENTIN) 875-125 MG per tablet 1 tablet  Status:  Discontinued        1 tablet Oral Every 12 hours 01/15/21 2334 01/15/21 2357   01/15/21 1800  vancomycin (VANCOCIN) IVPB 1000 mg/200 mL premix        1,000 mg 200 mL/hr over 60 Minutes Intravenous  Once 01/15/21 1753 01/15/21 2120       Time spent: 20  minutes    Erin Hearing ANP  [facility] 7 am - 330 pm/M-F for direct patient care and secure chat Please refer to Amion for contact info 47  days

## 2021-03-03 NOTE — Progress Notes (Signed)
CSW spoke with patient at bedside regarding pricing for extended stay hotels. Patient is agreeable to stay at the [facility] on [address] once she is able to obtain her debit card from her friend. Patient states she will contact her friend to confirm her date of arrival back in [place]. Patient agreeable to CSW assistance in applying for food stamps as well.  CSW to visit with patient in her room after HD session is completed this afternoon.  Madilyn Fireman, MSW, LCSW Transitions of Care  Clinical Social Worker II [phone]

## 2021-03-03 NOTE — Progress Notes (Signed)
PT Cancellation Note  Patient Details Name: Shalona Smyers MRN: [id number] DOB: Sep 24, 1976   Cancelled Treatment:    Reason Eval/Treat Not Completed: Patient at procedure or test/unavailable Was in HD when attempted to have PT session;   Will continue to follow,   Roney Marion, PT  Acute Rehabilitation Services Pager [phone] Office [phone]   Colletta [place] 03/03/2021, 2:04 PM

## 2021-03-04 DIAGNOSIS — Z659 Problem related to unspecified psychosocial circumstances: Secondary | ICD-10-CM | POA: Diagnosis not present

## 2021-03-04 DIAGNOSIS — N611 Abscess of the breast and nipple: Secondary | ICD-10-CM | POA: Diagnosis not present

## 2021-03-04 DIAGNOSIS — U071 COVID-19: Secondary | ICD-10-CM | POA: Diagnosis not present

## 2021-03-04 DIAGNOSIS — N186 End stage renal disease: Secondary | ICD-10-CM | POA: Diagnosis not present

## 2021-03-04 MED ORDER — PREGABALIN 25 MG PO CAPS: 25.0000 mg | ORAL_CAPSULE | Freq: Three times a day (TID) | ORAL | Status: DC

## 2021-03-04 MED ORDER — PREGABALIN 75 MG PO CAPS
75.0000 mg | ORAL_CAPSULE | Freq: Every day | ORAL | Status: DC
  Administered 2021-03-04 – 2021-03-17 (×14): 75 mg via ORAL
  Filled 2021-03-04 (×14): qty 1

## 2021-03-04 NOTE — Care Management Important Message (Signed)
Important Message  Patient Details  Name: Rebekah Burton MRN: [id number] Date of Birth: 1976-09-25   Medicare Important Message Given:  Yes     Malyiah Fellows P Daiki Dicostanzo 03/04/2021, 1:47 PM

## 2021-03-04 NOTE — Progress Notes (Signed)
Physical Therapy Treatment Patient Details Name: Akira Perusse MRN: [id number] DOB: 1976/06/18 Today's Date: 03/04/2021    History of Present Illness 45 y.o. female with developed a breast abscess and was seen in ED 01/04/21 where U/S confirmed abscess. She was to have been seen as outpatient for aspiration/drainage but did not get to make that appt. Today in HD the breast abscess opened and drained spontaneously. Because of continued drainage she presents to [facility] for evaluation. Pt admited on 01/15/21 and found to be COVID+ taken to OR for further exploration, culture, and excisional debridement 01/15/21. Medical history significant of ESRD-HD M-W-F, blindness, HTN, PAD    PT Comments    Pt received in bed, cooperative and pleasant. Reporting that she is hoping to be discharged to a hotel soon, where she will be on the ground level. Today's session focused on LE strengthening. Chose to do circuits to address/hip knee stability and muscular endurance. Increased knee pain with sit to stands even with height of bed elevated, but no pain with double leg dead lifts. Pt with good form throughout session, needing minimal cueing to maintain form as she started to fatigue. Pt left sitting on EOB with all needs met and call bell within reach.    Follow Up Recommendations  No PT follow up;Supervision for mobility/OOB     Equipment Recommendations  None recommended by PT    Recommendations for Other Services       Precautions / Restrictions Precautions Precautions: Other (comment) Precaution Comments: pt is blind, needs assist for navigation, L breast abscess Restrictions Weight Bearing Restrictions: No    Mobility  Bed Mobility Overal bed mobility: Modified Independent Bed Mobility: Supine to Sit     Supine to sit: Modified independent (Device/Increase time)          Transfers Overall transfer level: Modified independent Equipment used: None Transfers: Sit to/from Stand Sit to Stand:  Modified independent (Device/Increase time)            Ambulation/Gait Ambulation/Gait assistance: Min guard Gait Distance (Feet): 110 Feet (70 ft forward, 20 ft karake/grapevine to L, 20 ft karoke/grapevine to R) Assistive device: None Gait Pattern/deviations: Step-through pattern;Decreased step length - right;Decreased step length - left;Drifts right/left Gait velocity: decreased   General Gait Details: Cues for navigation, cueings for sequencing of grapevine pattern   Stairs             Wheelchair Mobility    Modified Rankin (Stroke Patients Only)       Balance Overall balance assessment: Needs assistance Sitting-balance support: No upper extremity supported;Feet supported Sitting balance-Leahy Scale: Normal     Standing balance support: During functional activity;No upper extremity supported Standing balance-Leahy Scale: Good Standing balance comment: Steady in static standing and foward walking                            Cognition Arousal/Alertness: Awake/alert Behavior During Therapy: WFL for tasks assessed/performed Overall Cognitive Status: Within Functional Limits for tasks assessed                                 General Comments: pleasant and cooperative, willing to try new exercises, aware of deficits and abilities      Exercises Other Exercises Other Exercises: Sit to stands, x10 with height of bed elevated (without use of arms to push from EOB, raised height of bed) Other Exercises: Circuit #1:  standing hamstring curls, standing hip abduction, standing hip extension; total 6 minutes, 1 minute per side for each exercise Other Exercises: Circuit #2: same as circuit 1 with standing marching as fast as possible for 1 minute, total 7 minutes Other Exercises: Bodyweight double leg deadlifts, x10    General Comments        Pertinent Vitals/Pain Pain Assessment: Faces Faces Pain Scale: Hurts a little bit Pain Location: L  breast abscess Pain Descriptors / Indicators: Discomfort;Aching;Guarding Pain Intervention(s): Monitored during session    Home Living                      Prior Function            PT Goals (current goals can now be found in the care plan section) Acute Rehab PT Goals Patient Stated Goal: move into hotel PT Goal Formulation: With patient Time For Goal Achievement: 03/18/21 Potential to Achieve Goals: Good    Frequency    Min 2X/week      PT Plan      Co-evaluation              AM-PAC PT "6 Clicks" Mobility   Outcome Measure  Help needed turning from your back to your side while in a flat bed without using bedrails?: None Help needed moving from lying on your back to sitting on the side of a flat bed without using bedrails?: None Help needed moving to and from a bed to a chair (including a wheelchair)?: None Help needed standing up from a chair using your arms (e.g., wheelchair or bedside chair)?: None Help needed to walk in hospital room?: A Little Help needed climbing 3-5 steps with a railing? : A Little 6 Click Score: 22    End of Session Equipment Utilized During Treatment: Gait belt Activity Tolerance: Patient tolerated treatment well Patient left: with call bell/phone within reach;in bed (sitting at EOB per her request) Nurse Communication: Mobility status PT Visit Diagnosis: Other abnormalities of gait and mobility (R26.89);Pain Pain - part of body:  (L breast and UE)     Time:  -     Charges:                        Rosita Kea, SPT

## 2021-03-04 NOTE — Progress Notes (Signed)
[facility] PROGRESS NOTE  Rebekah Burton [id number] DOB: January 18, 1976 DOA: 01/15/2021 PCP: Trey Sailors, PA  Status: Remains inpatient appropriate because:Unsafe d/c plan and Inpatient level of care appropriate due to severity of illness   Dispo: The patient is from: Homeless              Anticipated d/c is to: Local hotel with kitchenette features per patient request.  Patient states she has a friend who can assist her in obtaining her groceries.  Will not have access to her monthly check until 3/11.  That time she will be able to obtain $800.  LCSW has contacted [employer] and they report monthly rental will be $650.  LCSW also assisting patient with obtaining food stamps.  In addition plan is to discharge on nondialysis day.              Patient currently is medically stable to d/c.    Difficult to place patient Yes   Level of care: Med-Surg  Code Status: Full Family Communication:  DVT prophylaxis: SQ Heparin Vaccination status: Unknown-Incidentally Covid positive during this admission (01/15/2021)  Foley catheter: No  HPI:  45 y.o. female with PMH significant for ESRD on HD MWF, blindness, hypertension, PAD. Patient presented to the ED on 01/15/2021 with breast abscess, underwent I&D by general surgery.  She was initially treated with IV antibiotics later completed a course with oral antibiotics.  She was also found to be incidental Covid positive. Now she is off contact precautions. 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 IR and vascular were consulted. aVF is now working.Dialysis per nephrology.   Subjective: Awake and alert.  Continues to have pain in left breast.  States current pain medications and effective.  Discussed utilization of Neurontin but she states this has not worked well for her in the past.  She states Lyrica has worked in the past so we will  start this as adjunct to her narcotics.  Objective: Vitals:   03/03/21 2126 03/04/21 0518  BP: (!) 142/88 124/70  Pulse: 69 72  Resp: 16 18  Temp: 98.4 F (36.9 C) 98.7 F (37.1 C)  SpO2: 100% 100%    Intake/Output Summary (Last 24 hours) at 03/04/2021 0826 Last data filed at 03/04/2021 0601 Gross per 24 hour  Intake 360 ml  Output 783 ml  Net -423 ml   Filed Weights   03/01/21 1545 03/03/21 0834 03/03/21 1224  Weight: 98 kg 98.8 kg 98 kg    Exam:  Constitutional: Alert, calm, no acute distress Breast: Left breast mains tender but soft, some mild residual induration at the distal aspect of the breast around the nipple area. Respiratory: Lungs are clear and she is stable on room air Cardiac: Heart sounds are normal, no peripheral edema, skin warm and dry Abdomen: Soft nontender and tolerating diet LBM 3/8 Neurologic: CN 2-12 grossly intact except for known blindness/visual impairment. Sensation intact, DTR normal. Strength 5/5 x all 4 extremities.  Ext: Prior bilateral transmetatarsal amputations Psychiatric: Awake alert and oriented x3   Assessment/Plan: Acute problems: Breast abscess/recurrent -s/p incision drainage done by general surgery.-Pathology without any evidence of malignancy -She was started on Unasyn, cultures grew gram-positive cocci. She was transitioned to Augmentin and completed recommended duration of therapy -On 3/1 reemergent breast abscess.  General surgery consulted and patient was resumed on Augmentin -3/2 US guided aspiration of complex fluid collection yielded minimal fluid  returns -3/3 underwent intraoperative surgical debridement as well as biopsy to rule out underlying malignancy-pathology negative for malignancy and cultures negative for growth; on 3/8 surgery cleared from a surgical standpoint for discharge. -Continue OxyIR 15 mg every 4 hours as needed plus Tylenol 325 mg every 4 hours as needed - continue IV morphine prn for breakthrough  pain -3/10 add Lyrica 75 mg daily as adjunct for pain management  Incidental COVID-19 -patient is asymptomatic, no evidence of pulmonary involvement.  -Off isolation.Currently on room air.  ESRD -nephrology was consulted, had issues with cannulation of aVF. IR and vascular was consulted and underwent fistulogram demonstrated pseudoaneurysm.  -AVF is now working.  -Dialysis per nephrology.  -Continue sevelamer.  Anemia of chronic disease -monitor intermittently, hemoglobin is stable at 9.1. Continue Aranesp.   Other problems: Hyponatremia -patient on hemodialysis, nephrology following. Last Labs         Recent Labs  Lab 02/16/21 1204 02/17/21 1312 02/19/21 0837 02/22/21 0852  NA 132* 134* 135 133*     Hypertension -Blood pressure controlled on clonidine and Coreg.  Peripheral artery disease -hx of Fem-pop bypass. -Has history of left transmetatarsal amputation.    Data Reviewed: Basic Metabolic Panel: Recent Labs  Lab 02/25/21 1042 02/26/21 1420 03/01/21 0404 03/03/21 0616  NA 134* 133* 134* 134*  K 4.3 4.3 4.4 3.9  CL 100 90* 93* 93*  CO2  --  '27 24 26  '$ GLUCOSE 112* 190* 132* 156*  BUN 45* 63* 63* 42*  CREATININE 10.50* 12.94* 14.14* 12.14*  CALCIUM  --  10.8* 10.3 10.4*  PHOS  --  4.5 6.2* 6.2*   Liver Function Tests: Recent Labs  Lab 02/26/21 1420 03/01/21 0404 03/03/21 0616  ALBUMIN 3.4* 3.3* 3.1*   No results for input(s): LIPASE, AMYLASE in the last 168 hours. No results for input(s): AMMONIA in the last 168 hours. CBC: Recent Labs  Lab 02/25/21 1042 02/26/21 1408 02/27/21 0501 02/28/21 0409 03/03/21 0616  WBC  --  9.6 6.6 6.8 5.3  HGB 12.6 10.0* 10.1* 10.7* 10.0*  HCT 37.0 32.3* 33.1* 34.7* 32.3*  MCV  --  98.5 99.7 98.6 99.4  PLT  --  277 249 267 222   Cardiac Enzymes: No results for input(s): CKTOTAL, CKMB, CKMBINDEX, TROPONINI in the last 168 hours. BNP (last 3 results) Recent Labs    01/18/21 0136  01/19/21 0030 01/20/21 0020  BNP 1,410.7* 1,122.4* 1,383.7*    ProBNP (last 3 results) No results for input(s): PROBNP in the last 8760 hours.  CBG: No results for input(s): GLUCAP in the last 168 hours.  Recent Results (from the past 240 hour(s))  Aerobic/Anaerobic Culture w Gram Stain (surgical/deep wound)     Status: None   Collection Time: 02/24/21  4:57 PM   Specimen: Abscess  Result Value Ref Range Status   Specimen Description ABSCESS BREAST  Final   Special Requests NONE  Final   Gram Stain   Final    ABUNDANT WBC PRESENT, PREDOMINANTLY PMN NO ORGANISMS SEEN    Culture   Final    FEW FINEGOLDIA MAGNA MODERATE ANAEROBIC GRAM POSITIVE COCCI ACTINOTIGNUM SCHAALII Standardized susceptibility testing for this organism is not available. Performed at [facility], [place] [address] [place], [place] [address]    Report Status 03/03/2021 FINAL  Final  Surgical pcr screen     Status: None   Collection Time: 02/25/21  9:15 AM   Specimen: Nasal Mucosa; Nasal Swab  Result Value Ref Range Status  MRSA, PCR NEGATIVE NEGATIVE Final   Staphylococcus aureus NEGATIVE NEGATIVE Final    Comment: (NOTE) The Xpert SA Assay (FDA approved for NASAL specimens in patients 27 years of age and older), is one component of a comprehensive surveillance program. It is not intended to diagnose infection nor to guide or monitor treatment. Performed at [facility], [place] [address], [place], [place] [address]   Aerobic/Anaerobic Culture w Gram Stain (surgical/deep wound)     Status: None   Collection Time: 02/25/21 12:42 PM   Specimen: PATH Cytology Misc. fluid; Body Fluid  Result Value Ref Range Status   Specimen Description BREAST  Final   Special Requests LEFT  Final   Gram Stain   Final    ABUNDANT WBC PRESENT,BOTH PMN AND MONONUCLEAR NO ORGANISMS SEEN    Culture   Final    No growth aerobically or anaerobically. Performed at [facility], [place] [address],  [place], [place] [address]    Report Status 03/02/2021 FINAL  Final     Studies: No results found.  Scheduled Meds: . (feeding supplement) PROSource Plus  30 mL Oral BID BM  . carvedilol  25 mg Oral BID WC  . Chlorhexidine Gluconate Cloth  6 each Topical Q0600  . cloNIDine  0.3 mg Oral TID  . darbepoetin (ARANESP) injection - DIALYSIS  150 mcg Intravenous Q Mon-HD  . heparin  5,000 Units Subcutaneous Q8H  . multivitamin  1 tablet Oral QHS  . senna  1 tablet Oral BID  . sevelamer carbonate  3,200 mg Oral TID WC   Continuous Infusions: . sodium chloride    . sodium chloride 10 mL/hr at 02/27/21 [id number]    Active Problems:   ESRD (end stage renal disease) ([place])   Breast abscess   COVID-19 virus infection   Breast abscess of female   Left breast abscess   Poor social situation   Consultants:  Nephrology  General surgery  Interventional radiology  Procedures:  Aspiration of breast abscess per IR 3/1 with further image guided aspiration of complex breast abscess planned for 3/2  Antibiotics: Anti-infectives (From admission, onward)   Start     Dose/Rate Route Frequency Ordered Stop   02/27/21 1730  clindamycin (CLEOCIN) capsule 300 mg        300 mg Oral Every 8 hours 02/27/21 1631 03/03/21 0511   02/24/21 2200  amoxicillin-clavulanate (AUGMENTIN) 500-125 MG per tablet 500 mg  Status:  Discontinued        1 tablet Oral Daily at bedtime 02/23/21 1149 02/24/21 0817   02/24/21 1200  clindamycin (CLEOCIN) capsule 300 mg  Status:  Discontinued        300 mg Oral Every 6 hours 02/24/21 0817 02/24/21 0952   02/24/21 1200  clindamycin (CLEOCIN) IVPB 600 mg  Status:  Discontinued        600 mg 100 mL/hr over 30 Minutes Intravenous Every 6 hours 02/24/21 0952 02/27/21 1631   02/23/21 1245  amoxicillin-clavulanate (AUGMENTIN) 500-125 MG per tablet 500 mg        1 tablet Oral  Once 02/23/21 1149 02/23/21 1445   02/23/21 1215  amoxicillin-clavulanate (AUGMENTIN) 875-125 MG per tablet 1  tablet  Status:  Discontinued        1 tablet Oral Every 12 hours 02/23/21 1120 02/23/21 1149   01/23/21 1400  amoxicillin-clavulanate (AUGMENTIN) 500-125 MG per tablet 500 mg        500 mg Oral Every 24 hours 01/23/21 1124 01/25/21  1657   01/23/21 1015  amoxicillin-clavulanate (AUGMENTIN) 875-125 MG per tablet 1 tablet  Status:  Discontinued        1 tablet Oral Every 12 hours 01/23/21 1002 01/23/21 1124   01/20/21 0845  vancomycin (VANCOCIN) 1-5 GM/200ML-% IVPB       Note to Pharmacy: Judieth Keens  : cabinet override      01/20/21 0845 01/20/21 2059   01/18/21 1200  vancomycin (VANCOCIN) IVPB 1000 mg/200 mL premix  Status:  Discontinued        1,000 mg 200 mL/hr over 60 Minutes Intravenous Every M-W-F (Hemodialysis) 01/16/21 1017 01/21/21 1324   01/17/21 2200  Ampicillin-Sulbactam (UNASYN) 3 g in sodium chloride 0.9 % 100 mL IVPB  Status:  Discontinued        3 g 200 mL/hr over 30 Minutes Intravenous Daily at bedtime 01/16/21 1058 01/23/21 1002   01/17/21 1000  remdesivir 100 mg in sodium chloride 0.9 % 100 mL IVPB       "Followed by" Linked Group Details   100 mg 200 mL/hr over 30 Minutes Intravenous Daily 01/15/21 2357 01/18/21 0918   01/16/21 1145  Ampicillin-Sulbactam (UNASYN) 3 g in sodium chloride 0.9 % 100 mL IVPB        3 g 200 mL/hr over 30 Minutes Intravenous  Once 01/16/21 1058 01/16/21 1417   01/16/21 1100  vancomycin (VANCOREADY) IVPB 1500 mg/300 mL        1,500 mg 150 mL/hr over 120 Minutes Intravenous  Once 01/16/21 1017 01/16/21 1849   01/16/21 0300  remdesivir 200 mg in sodium chloride 0.9% 250 mL IVPB       "Followed by" Linked Group Details   200 mg 580 mL/hr over 30 Minutes Intravenous Once 01/15/21 2357 01/16/21 0543   01/15/21 2345  amoxicillin-clavulanate (AUGMENTIN) 875-125 MG per tablet 1 tablet  Status:  Discontinued        1 tablet Oral Every 12 hours 01/15/21 2334 01/15/21 2357   01/15/21 1800  vancomycin (VANCOCIN) IVPB 1000 mg/200 mL premix         1,000 mg 200 mL/hr over 60 Minutes Intravenous  Once 01/15/21 1753 01/15/21 2120       Time spent: 20 minutes    Erin Hearing ANP  [facility] 7 am - 330 pm/M-F for direct patient care and secure chat Please refer to Amion for contact info 48  days

## 2021-03-04 NOTE — Progress Notes (Addendum)
[facility] Progress Note   Subjective: Seen in room, talking on phone. No C/Os.     Objective Vitals:   03/03/21 1827 03/03/21 2126 03/04/21 0518 03/04/21 1028  BP: 109/68 (!) 142/88 124/70 (!) 143/88  Pulse: 72 69 72 79  Resp: '16 16 18 14  '$ Temp: 98.8 F (37.1 C) 98.4 F (36.9 C) 98.7 F (37.1 C) 99.2 F (37.3 C)  TempSrc:      SpO2: 100% 100% 100% 100%  Weight:      Height:       Physical Exam General:obese female in NAD Heart:S1,S2, RRR Lungs:CTAB Abdomen:Active BS Extremities:No LE edema. Dialysis Access:L AVF + bruit   Additional Objective Labs: Basic Metabolic Panel: Recent Labs  Lab 02/26/21 1420 03/01/21 0404 03/03/21 0616  NA 133* 134* 134*  K 4.3 4.4 3.9  CL 90* 93* 93*  CO2 '27 24 26  '$ GLUCOSE 190* 132* 156*  BUN 63* 63* 42*  CREATININE 12.94* 14.14* 12.14*  CALCIUM 10.8* 10.3 10.4*  PHOS 4.5 6.2* 6.2*   Liver Function Tests: Recent Labs  Lab 02/26/21 1420 03/01/21 0404 03/03/21 0616  ALBUMIN 3.4* 3.3* 3.1*   No results for input(s): LIPASE, AMYLASE in the last 168 hours. CBC: Recent Labs  Lab 02/26/21 1408 02/27/21 0501 02/28/21 0409 03/03/21 0616  WBC 9.6 6.6 6.8 5.3  HGB 10.0* 10.1* 10.7* 10.0*  HCT 32.3* 33.1* 34.7* 32.3*  MCV 98.5 99.7 98.6 99.4  PLT 277 249 267 222   Blood Culture    Component Value Date/Time   SDES BREAST 02/25/2021 1242   SPECREQUEST LEFT 02/25/2021 1242   CULT  02/25/2021 1242    No growth aerobically or anaerobically. Performed at [facility], [place] [address], [place], [place] [address]    REPTSTATUS 03/02/2021 FINAL 02/25/2021 1242    Cardiac Enzymes: No results for input(s): CKTOTAL, CKMB, CKMBINDEX, TROPONINI in the last 168 hours. CBG: No results for input(s): GLUCAP in the last 168 hours. Iron Studies: No results for input(s): IRON, TIBC, TRANSFERRIN, FERRITIN in the last 72 hours. '@lablastinr3'$ @ Studies/Results: No results found. Medications: . sodium  chloride    . sodium chloride 10 mL/hr at 02/27/21 0831   . (feeding supplement) PROSource Plus  30 mL Oral BID BM  . carvedilol  25 mg Oral BID WC  . Chlorhexidine Gluconate Cloth  6 each Topical Q0600  . cloNIDine  0.3 mg Oral TID  . darbepoetin (ARANESP) injection - DIALYSIS  150 mcg Intravenous Q Mon-HD  . heparin  5,000 Units Subcutaneous Q8H  . multivitamin  1 tablet Oral QHS  . pregabalin  75 mg Oral QHS  . senna  1 tablet Oral BID  . sevelamer carbonate  3,200 mg Oral TID WC     Dialysis Orders: [place] 4h 72mn 2/2.5 bath 105kg AVF  Heparin 7000 units IV initial bolus Heparin 3000 units IV mid run.  parsabiv 15 mgIVthree times a week with HD mircera 150 mcgIVevery 2 weeks - last given 01/13/21 hectorol 3 mcgIVthree times a week  Assessment/Plan: 1.Left breast abscess -admit team and CCS managing, status post I&D abscess 01/15/2021 and 02/25/21 pathology was negative for malignancy on 01/21"although some concern for possible inflammatory breast cancer"repeat biopsy done 03/03. Now on oral clindamycin. 2.HD vascular access: Recurrentcannulationissuesw/AVF.IR completed f'gram 01/25/21 showing significant pseudoaneurysm s/p thrombin injection and near complete thrombosis of pseudoaneurysm per IR.Cannulation issues now appear to have resolved, will continue to monitor. Was on heparin prior to admission but wasn't included  in orders. Will resume tomorrow at lower dose.  3.Covid 19 on admission. Now out of isolation window.  5. ESRD: MWF. Next  HD 03/05/21. No heparin. K+ 4.4.  6. Secondary hyperparathyroidism -PO4 slightly elevated. C Ca high. Reminded to take binders. Use 2.0 Ca bath with HD.  7. HGB at goal. Continue weekly ESA.  8. HTN/volume -BP controlled, edema resolved. She isbelow EDW, will need to be lowered at d/c.Reset EDW to 98 kg.UF as tolerated.  9. Nutrition -Low Albumin. Renal diet with fld restriction. Add protein  supps.  Disposition: Now plan is for hotel when medically stable. She does not wish to go to ALF/SNF although I believe the latter options would be a safer choice. Appreciate MSW's assistance.  Jud Fanguy H. Donnella Morford NP-C 03/04/2021, 12:35 PM  [employer] [phone]

## 2021-03-04 NOTE — Progress Notes (Signed)
Renal Navigator called [facility]/[phone] and learned that she is still active with a standing MWF order from home to HD. Navigator confirmed with transport company that she is still in the hospital, with plans for dc to hotel, hopefully sometime next week, per information from DTP team. Navigator changed patient's address to [employer], [place], and will provide transport company with room number and discharge date when available.   Alphonzo Cruise, [place] Renal Navigator [phone]

## 2021-03-04 NOTE — Plan of Care (Signed)
  Problem: Pain Managment: Goal: General experience of comfort will improve Outcome: Progressing   Problem: Safety: Goal: Ability to remain free from injury will improve Outcome: Progressing   Problem: Education: Goal: Knowledge of disease and its progression will improve Outcome: Progressing Goal: Individualized Educational Video(s) Outcome: Progressing   Problem: Fluid Volume: Goal: Compliance with measures to maintain balanced fluid volume will improve Outcome: Progressing   Problem: Health Behavior/Discharge Planning: Goal: Ability to manage health-related needs will improve Outcome: Progressing

## 2021-03-05 DIAGNOSIS — U071 COVID-19: Secondary | ICD-10-CM | POA: Diagnosis not present

## 2021-03-05 DIAGNOSIS — N611 Abscess of the breast and nipple: Secondary | ICD-10-CM | POA: Diagnosis not present

## 2021-03-05 DIAGNOSIS — Z659 Problem related to unspecified psychosocial circumstances: Secondary | ICD-10-CM | POA: Diagnosis not present

## 2021-03-05 DIAGNOSIS — N186 End stage renal disease: Secondary | ICD-10-CM | POA: Diagnosis not present

## 2021-03-05 MED ORDER — LIDOCAINE HCL (PF) 1 % IJ SOLN
5.0000 mL | INTRAMUSCULAR | Status: DC | PRN
Start: 2021-03-05 — End: 2021-03-05

## 2021-03-05 MED ORDER — SODIUM CHLORIDE 0.9 % IV SOLN: 100.0000 mL | INTRAVENOUS | Status: DC | PRN

## 2021-03-05 MED ORDER — HEPARIN SODIUM (PORCINE) 1000 UNIT/ML DIALYSIS: 5000.0000 [IU] | Freq: Once | INTRAMUSCULAR | Status: DC

## 2021-03-05 MED ORDER — HEPARIN SODIUM (PORCINE) 1000 UNIT/ML IJ SOLN
INTRAMUSCULAR | Status: AC
  Filled 2021-03-05: qty 5

## 2021-03-05 MED ORDER — OXYCODONE HCL 5 MG PO TABS
ORAL_TABLET | ORAL | Status: AC
  Filled 2021-03-05: qty 3

## 2021-03-05 MED ORDER — PENTAFLUOROPROP-TETRAFLUOROETH EX AERO
1.0000 | INHALATION_SPRAY | CUTANEOUS | Status: DC | PRN
Start: 2021-03-05 — End: 2021-03-05

## 2021-03-05 MED ORDER — LIDOCAINE-PRILOCAINE 2.5-2.5 % EX CREA
1.0000 | TOPICAL_CREAM | CUTANEOUS | Status: DC | PRN
Start: 2021-03-05 — End: 2021-03-05

## 2021-03-05 NOTE — Plan of Care (Signed)
  Problem: Pain Managment: Goal: General experience of comfort will improve Outcome: Progressing   Problem: Safety: Goal: Ability to remain free from injury will improve Outcome: Progressing   Problem: Education: Goal: Knowledge of disease and its progression will improve Outcome: Progressing Goal: Individualized Educational Video(s) Outcome: Progressing   Problem: Fluid Volume: Goal: Compliance with measures to maintain balanced fluid volume will improve Outcome: Progressing   Problem: Health Behavior/Discharge Planning: Goal: Ability to manage health-related needs will improve Outcome: Progressing

## 2021-03-05 NOTE — Progress Notes (Signed)
[facility] PROGRESS NOTE  Rebekah Burton [id number] DOB: 1976/08/12 DOA: 01/15/2021 PCP: Trey Sailors, PA  Status: Remains inpatient appropriate because:Unsafe d/c plan and Inpatient level of care appropriate due to severity of illness   Dispo: The patient is from: Homeless              Anticipated d/c is to: Local hotel with kitchenette features per patient request.  Patient states she has a friend who can assist her in obtaining her groceries.  Will not have access to her monthly check until 3/11.  That time she will be able to obtain $800.  LCSW has contacted [employer] and they report monthly rental will be $650.  LCSW also assisting patient with obtaining food stamps.  In addition plan is to discharge on nondialysis day.  At this time patient will have her funds available to her and we will discharge her on Tuesday 3/15.  Case management will also organize home health to involve nurse visit for wound care that is twice daily given patient's visual impairment and inability to perform wound care independently              Patient currently is medically stable to d/c.    Difficult to place patient Yes   Level of care: Med-Surg  Code Status: Full Family Communication:  DVT prophylaxis: SQ Heparin Vaccination status: Unknown-Incidentally Covid positive during this admission (01/15/2021)  Foley catheter: No  HPI:  45 y.o. female with PMH significant for ESRD on HD MWF, blindness, hypertension, PAD. Patient presented to the ED on 01/15/2021 with breast abscess, underwent I&D by general surgery.  She was initially treated with IV antibiotics later completed a course with oral antibiotics.  She was also found to be incidental Covid positive. Now she is off contact precautions. 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 IR and vascular were consulted. aVF is now working.Dialysis  per nephrology.   Subjective: Awake.  Examined during dialysis.  Reports Lyrica significantly improved her acute pain in the breast.  Objective: Vitals:   03/05/21 1130 03/05/21 1159  BP: 136/76 (!) 141/91  Pulse: 67 69  Resp:  16  Temp:  97.9 F (36.6 C)  SpO2:  97%    Intake/Output Summary (Last 24 hours) at 03/05/2021 1221 Last data filed at 03/05/2021 1138 Gross per 24 hour  Intake 477 ml  Output 1500 ml  Net -1023 ml   Filed Weights   03/03/21 1224 03/05/21 0750 03/05/21 1159  Weight: 98 kg 99.3 kg 97.8 kg    Exam:  Constitutional: Alert, calm, mild pain during dialysis and requested a dose of OxyIR Breast: Left breast mains tender but soft, some mild residual induration at the distal aspect of the breast around the nipple area. Respiratory: Stable on room air with clear lungs on anterior exam Cardiac: Heart sounds are normal, her pulse is regular, there is no tachycardia and she remains hemodynamically stable during dialysis. Abdomen: Soft nontender and tolerating diet LBM 3/8 Neurologic: CN 2-12 grossly intact except for known blindness/visual impairment. Sensation intact, DTR normal. Strength 5/5 x all 4 extremities.  Ext: Prior bilateral transmetatarsal amputations Psychiatric: Awake alert and oriented x3   Assessment/Plan: Acute problems: Breast abscess/recurrent -s/p incision drainage done by general surgery.-Pathology without any evidence of malignancy -She was started on Unasyn, cultures grew gram-positive cocci. She was transitioned to Augmentin and completed recommended duration of therapy -On 3/1 reemergent  breast abscess.  General surgery consulted and patient was resumed on Augmentin -3/2 US guided aspiration of complex fluid collection yielded minimal fluid returns -3/3 underwent intraoperative surgical debridement as well as biopsy to rule out underlying malignancy-pathology negative for malignancy and cultures negative for growth; on 3/8 surgery  cleared from a surgical standpoint for discharge. -Continue OxyIR 15 mg every 4 hours as needed plus Tylenol 325 mg every 4 hours as needed - continue IV morphine prn for breakthrough pain -3/10 added Lyrica 75 mg daily as adjunct for pain management significant improvement in pain.  Of note her primary insurance requires preauthorization for Lyrica but Medicaid will pay for this medication at $4 per prescription  Incidental COVID-19 -patient is asymptomatic, no evidence of pulmonary involvement.  -Off isolation.Currently on room air.  ESRD -nephrology was consulted, had issues with cannulation of aVF. IR and vascular was consulted and underwent fistulogram demonstrated pseudoaneurysm.  -AVF is now working.  -Dialysis per nephrology.  -Continue sevelamer.  Anemia of chronic disease -monitor intermittently, hemoglobin is stable at 9.1. Continue Aranesp.   Other problems: Hyponatremia -patient on hemodialysis, nephrology following. Last Labs         Recent Labs  Lab 02/16/21 1204 02/17/21 1312 02/19/21 0837 02/22/21 0852  NA 132* 134* 135 133*     Hypertension -Blood pressure controlled on clonidine and Coreg.  Peripheral artery disease -hx of Fem-pop bypass. -Has history of left transmetatarsal amputation.    Data Reviewed: Basic Metabolic Panel: Recent Labs  Lab 02/26/21 1420 03/01/21 0404 03/03/21 0616  NA 133* 134* 134*  K 4.3 4.4 3.9  CL 90* 93* 93*  CO2 '27 24 26  '$ GLUCOSE 190* 132* 156*  BUN 63* 63* 42*  CREATININE 12.94* 14.14* 12.14*  CALCIUM 10.8* 10.3 10.4*  PHOS 4.5 6.2* 6.2*   Liver Function Tests: Recent Labs  Lab 02/26/21 1420 03/01/21 0404 03/03/21 0616  ALBUMIN 3.4* 3.3* 3.1*   No results for input(s): LIPASE, AMYLASE in the last 168 hours. No results for input(s): AMMONIA in the last 168 hours. CBC: Recent Labs  Lab 02/26/21 1408 02/27/21 0501 02/28/21 0409 03/03/21 0616  WBC 9.6 6.6 6.8 5.3  HGB 10.0* 10.1* 10.7*  10.0*  HCT 32.3* 33.1* 34.7* 32.3*  MCV 98.5 99.7 98.6 99.4  PLT 277 249 267 222   Cardiac Enzymes: No results for input(s): CKTOTAL, CKMB, CKMBINDEX, TROPONINI in the last 168 hours. BNP (last 3 results) Recent Labs    01/18/21 0136 01/19/21 0030 01/20/21 0020  BNP 1,410.7* 1,122.4* 1,383.7*    ProBNP (last 3 results) No results for input(s): PROBNP in the last 8760 hours.  CBG: No results for input(s): GLUCAP in the last 168 hours.  Recent Results (from the past 240 hour(s))  Aerobic/Anaerobic Culture w Gram Stain (surgical/deep wound)     Status: None   Collection Time: 02/24/21  4:57 PM   Specimen: Abscess  Result Value Ref Range Status   Specimen Description ABSCESS BREAST  Final   Special Requests NONE  Final   Gram Stain   Final    ABUNDANT WBC PRESENT, PREDOMINANTLY PMN NO ORGANISMS SEEN    Culture   Final    FEW FINEGOLDIA MAGNA MODERATE ANAEROBIC GRAM POSITIVE COCCI ACTINOTIGNUM SCHAALII Standardized susceptibility testing for this organism is not available. Performed at [facility], [place] [address] [place], [place] [address]    Report Status 03/03/2021 FINAL  Final  Surgical pcr screen     Status: None   Collection  Time: 02/25/21  9:15 AM   Specimen: Nasal Mucosa; Nasal Swab  Result Value Ref Range Status   MRSA, PCR NEGATIVE NEGATIVE Final   Staphylococcus aureus NEGATIVE NEGATIVE Final    Comment: (NOTE) The Xpert SA Assay (FDA approved for NASAL specimens in patients 39 years of age and older), is one component of a comprehensive surveillance program. It is not intended to diagnose infection nor to guide or monitor treatment. Performed at [facility], [place] [address], [place], [place] [address]   Aerobic/Anaerobic Culture w Gram Stain (surgical/deep wound)     Status: None   Collection Time: 02/25/21 12:42 PM   Specimen: PATH Cytology Misc. fluid; Body Fluid  Result Value Ref Range Status   Specimen Description BREAST  Final    Special Requests LEFT  Final   Gram Stain   Final    ABUNDANT WBC PRESENT,BOTH PMN AND MONONUCLEAR NO ORGANISMS SEEN    Culture   Final    No growth aerobically or anaerobically. Performed at [facility], [place] [address], [place], [place] [address]    Report Status 03/02/2021 FINAL  Final     Studies: No results found.  Scheduled Meds: . (feeding supplement) PROSource Plus  30 mL Oral BID BM  . carvedilol  25 mg Oral BID WC  . Chlorhexidine Gluconate Cloth  6 each Topical Q0600  . cloNIDine  0.3 mg Oral TID  . darbepoetin (ARANESP) injection - DIALYSIS  150 mcg Intravenous Q Mon-HD  . heparin  5,000 Units Subcutaneous Q8H  . heparin  5,000 Units Dialysis Once in dialysis  . heparin sodium (porcine)      . multivitamin  1 tablet Oral QHS  . oxyCODONE      . pregabalin  75 mg Oral QHS  . senna  1 tablet Oral BID  . sevelamer carbonate  3,200 mg Oral TID WC   Continuous Infusions: . sodium chloride    . sodium chloride 10 mL/hr at 02/27/21 0831  . sodium chloride    . sodium chloride      Active Problems:   ESRD (end stage renal disease) ([place])   Breast abscess   COVID-19 virus infection   Breast abscess of female   Left breast abscess   Poor social situation   Consultants:  Nephrology  General surgery  Interventional radiology  Procedures:  Aspiration of breast abscess per IR 3/1 with further image guided aspiration of complex breast abscess planned for 3/2  Antibiotics: Anti-infectives (From admission, onward)   Start     Dose/Rate Route Frequency Ordered Stop   02/27/21 1730  clindamycin (CLEOCIN) capsule 300 mg        300 mg Oral Every 8 hours 02/27/21 1631 03/03/21 0511   02/24/21 2200  amoxicillin-clavulanate (AUGMENTIN) 500-125 MG per tablet 500 mg  Status:  Discontinued        1 tablet Oral Daily at bedtime 02/23/21 1149 02/24/21 0817   02/24/21 1200  clindamycin (CLEOCIN) capsule 300 mg  Status:  Discontinued        300 mg Oral Every 6  hours 02/24/21 0817 02/24/21 0952   02/24/21 1200  clindamycin (CLEOCIN) IVPB 600 mg  Status:  Discontinued        600 mg 100 mL/hr over 30 Minutes Intravenous Every 6 hours 02/24/21 0952 02/27/21 1631   02/23/21 1245  amoxicillin-clavulanate (AUGMENTIN) 500-125 MG per tablet 500 mg        1 tablet Oral  Once 02/23/21  1149 02/23/21 1445   02/23/21 1215  amoxicillin-clavulanate (AUGMENTIN) 875-125 MG per tablet 1 tablet  Status:  Discontinued        1 tablet Oral Every 12 hours 02/23/21 1120 02/23/21 1149   01/23/21 1400  amoxicillin-clavulanate (AUGMENTIN) 500-125 MG per tablet 500 mg        500 mg Oral Every 24 hours 01/23/21 1124 01/25/21 1657   01/23/21 1015  amoxicillin-clavulanate (AUGMENTIN) 875-125 MG per tablet 1 tablet  Status:  Discontinued        1 tablet Oral Every 12 hours 01/23/21 1002 01/23/21 1124   01/20/21 0845  vancomycin (VANCOCIN) 1-5 GM/200ML-% IVPB       Note to Pharmacy: Judieth Keens  : cabinet override      01/20/21 0845 01/20/21 2059   01/18/21 1200  vancomycin (VANCOCIN) IVPB 1000 mg/200 mL premix  Status:  Discontinued        1,000 mg 200 mL/hr over 60 Minutes Intravenous Every M-W-F (Hemodialysis) 01/16/21 1017 01/21/21 1324   01/17/21 2200  Ampicillin-Sulbactam (UNASYN) 3 g in sodium chloride 0.9 % 100 mL IVPB  Status:  Discontinued        3 g 200 mL/hr over 30 Minutes Intravenous Daily at bedtime 01/16/21 1058 01/23/21 1002   01/17/21 1000  remdesivir 100 mg in sodium chloride 0.9 % 100 mL IVPB       "Followed by" Linked Group Details   100 mg 200 mL/hr over 30 Minutes Intravenous Daily 01/15/21 2357 01/18/21 0918   01/16/21 1145  Ampicillin-Sulbactam (UNASYN) 3 g in sodium chloride 0.9 % 100 mL IVPB        3 g 200 mL/hr over 30 Minutes Intravenous  Once 01/16/21 1058 01/16/21 1417   01/16/21 1100  vancomycin (VANCOREADY) IVPB 1500 mg/300 mL        1,500 mg 150 mL/hr over 120 Minutes Intravenous  Once 01/16/21 1017 01/16/21 1849   01/16/21 0300   remdesivir 200 mg in sodium chloride 0.9% 250 mL IVPB       "Followed by" Linked Group Details   200 mg 580 mL/hr over 30 Minutes Intravenous Once 01/15/21 2357 01/16/21 0543   01/15/21 2345  amoxicillin-clavulanate (AUGMENTIN) 875-125 MG per tablet 1 tablet  Status:  Discontinued        1 tablet Oral Every 12 hours 01/15/21 2334 01/15/21 2357   01/15/21 1800  vancomycin (VANCOCIN) IVPB 1000 mg/200 mL premix        1,000 mg 200 mL/hr over 60 Minutes Intravenous  Once 01/15/21 1753 01/15/21 2120       Time spent: 20 minutes    Erin Hearing ANP  [facility] 7 am - 330 pm/M-F for direct patient care and secure chat Please refer to Amion for contact info 49  days

## 2021-03-05 NOTE — Progress Notes (Signed)
CSW spoke with patient to inquire about her financial resources to obtain a hotel room - patient states she does not have the card number for her debit card and that her friend still has possession of it. Patient receiving HD at the time of discussion - she requests to remain hospitalized until she can obtain her money to pay for her hotel - likely discharge on Tuesday 3/15. Patient confirms she uses [facility] for transportation to and from HD treatments.  Madilyn Fireman, MSW, LCSW Transitions of Care  Clinical Social Worker II [phone]

## 2021-03-05 NOTE — TOC Benefit Eligibility Note (Signed)
Transition of Care [facility]) Benefit Eligibility Note    Patient Details  Name: Rebekah Burton MRN: [id number] Date of Birth: 06-Mar-1976   Medication/Dose: LYRICA 71 MG DAILY  Covered?: No (NOT COVER   and  NON-FORMULARY)     Prescription Coverage Preferred [facility]: [facility] [address]  Spoke with Person/Company/Phone Number:: MARIZ  @  [employer] [id number] # [phone]     Prior Approval: Yes [phone])     Additional Notes: SECONDARY INS : MEDICAID [place]   EFF-DATE 12-26-2018  CO-PAY- $ 4.00 FOR EACH PRESCRIPTION    Memory Argue Phone Number: 03/05/2021, 10:57 AM

## 2021-03-05 NOTE — Progress Notes (Signed)
[facility] Progress Note   Subjective: Seen in HD unit. UF goal 2L. Talking on phone, no complaints.   Objective Vitals:   03/04/21 1028 03/04/21 1816 03/04/21 2124 03/05/21 0541  BP: (!) 143/88 118/62 (!) 142/94 126/80  Pulse: 79 72 74 71  Resp: '14 16 18 16  '$ Temp: 99.2 F (37.3 C) 98.5 F (36.9 C) 98.4 F (36.9 C) 98.4 F (36.9 C)  TempSrc:   Oral Oral  SpO2: 100% 99% 99% 100%  Weight:      Height:       Physical Exam General:obese female in NAD Heart:S1,S2, RRR Lungs:CTAB Abdomen:Active BS Extremities:No LE edema. Dialysis Access:L AVF + bruit   Additional Objective Labs: Basic Metabolic Panel: Recent Labs  Lab 02/26/21 1420 03/01/21 0404 03/03/21 0616  NA 133* 134* 134*  K 4.3 4.4 3.9  CL 90* 93* 93*  CO2 '27 24 26  '$ GLUCOSE 190* 132* 156*  BUN 63* 63* 42*  CREATININE 12.94* 14.14* 12.14*  CALCIUM 10.8* 10.3 10.4*  PHOS 4.5 6.2* 6.2*   Liver Function Tests: Recent Labs  Lab 02/26/21 1420 03/01/21 0404 03/03/21 0616  ALBUMIN 3.4* 3.3* 3.1*   No results for input(s): LIPASE, AMYLASE in the last 168 hours. CBC: Recent Labs  Lab 02/26/21 1408 02/27/21 0501 02/28/21 0409 03/03/21 0616  WBC 9.6 6.6 6.8 5.3  HGB 10.0* 10.1* 10.7* 10.0*  HCT 32.3* 33.1* 34.7* 32.3*  MCV 98.5 99.7 98.6 99.4  PLT 277 249 267 222   Blood Culture    Component Value Date/Time   SDES BREAST 02/25/2021 1242   SPECREQUEST LEFT 02/25/2021 1242   CULT  02/25/2021 1242    No growth aerobically or anaerobically. Performed at [facility], [place] [address], [place], [place] [address]    REPTSTATUS 03/02/2021 FINAL 02/25/2021 1242    Cardiac Enzymes: No results for input(s): CKTOTAL, CKMB, CKMBINDEX, TROPONINI in the last 168 hours. CBG: No results for input(s): GLUCAP in the last 168 hours. Iron Studies: No results for input(s): IRON, TIBC, TRANSFERRIN, FERRITIN in the last 72 hours. '@lablastinr3'$ @ Studies/Results: No results  found. Medications: . sodium chloride    . sodium chloride 10 mL/hr at 02/27/21 0831  . sodium chloride    . sodium chloride     . (feeding supplement) PROSource Plus  30 mL Oral BID BM  . carvedilol  25 mg Oral BID WC  . Chlorhexidine Gluconate Cloth  6 each Topical Q0600  . cloNIDine  0.3 mg Oral TID  . darbepoetin (ARANESP) injection - DIALYSIS  150 mcg Intravenous Q Mon-HD  . heparin  5,000 Units Subcutaneous Q8H  . heparin  5,000 Units Dialysis Once in dialysis  . heparin sodium (porcine)      . multivitamin  1 tablet Oral QHS  . pregabalin  75 mg Oral QHS  . senna  1 tablet Oral BID  . sevelamer carbonate  3,200 mg Oral TID WC     Dialysis Orders: [place] 4h 67mn 2/2.5 bath 105kg AVF  Heparin 7000 units IV initial bolus Heparin 3000 units IV mid run.  parsabiv 15 mgIVthree times a week with HD mircera 150 mcgIVevery 2 weeks - last given 01/13/21 hectorol 3 mcgIVthree times a week  Assessment/Plan: 1.Left breast abscess -s/p I&D abscess 01/15/2021 and 02/25/21 pathology was negative for malignancy on 01/21"although some concern for possible inflammatory breast cancer"repeat biopsy done 03/03. Now on oral clindamycin. 2.HD vascular access: Recurrentcannulationissuesw/AVF.IR completed f'gram 01/25/21 showing significant pseudoaneurysm s/p thrombin injection  and near complete thrombosis of pseudoaneurysm per IR.Cannulation issues now appear to have resolved, will continue to monitor. Was on heparin prior to admission but wasn't included in orders. Will resume at lower dose.  3.Covid 19 on admission. Now out of isolation window.  5. ESRD: HD MWF. HD today on schedule  6. Secondary hyperparathyroidism - Phos sl elevated -continue Renvela binder. Corr Ca elevated   Use 2.0 Ca bath with HD.  7. HGB at goal. Continue weekly ESA.  8. HTN/volume -BP controlled, edema resolved. She isbelow EDW, will need to be lowered at d/c.Reset EDW to 98 kg.UF as  tolerated.  9. Nutrition -Low Albumin. Renal diet with fld restriction. Add protein supps.  Disposition: Now plan is for hotel when medically stable. She does not wish to go to ALF/SNF although I believe the latter options would be a safer choice. Appreciate MSW's assistance.  Lynnda Child PA-C [facility] 03/05/2021,8:29 AM

## 2021-03-05 NOTE — Care Management (Signed)
Case management placed a benefit's check with TOC cma to coverage for Lyrica 5 75 mg by mouth daily for 30-day supply.

## 2021-03-06 DIAGNOSIS — N611 Abscess of the breast and nipple: Secondary | ICD-10-CM | POA: Diagnosis not present

## 2021-03-06 NOTE — Progress Notes (Signed)
[facility] Progress Note   Subjective:  Completed HD yesterday net UF 1.5L  No complaints this am.   Objective Vitals:   03/05/21 1824 03/05/21 2000 03/06/21 0500 03/06/21 1014  BP: 99/61 104/72 118/75 110/68  Pulse: 79  74 80  Resp: '17 18 18 18  '$ Temp: 98.1 F (36.7 C) 98.5 F (36.9 C) 98.7 F (37.1 C) 97.8 F (36.6 C)  TempSrc: Oral Oral Oral Oral  SpO2: 99% 98% 100% 96%  Weight:      Height:       Physical Exam General:obese female in NAD Heart:S1,S2, RRR Lungs:CTAB Abdomen:Active BS Extremities:No LE edema. Dialysis Access:L AVF + bruit   Additional Objective Labs: Basic Metabolic Panel: Recent Labs  Lab 03/01/21 0404 03/03/21 0616  NA 134* 134*  K 4.4 3.9  CL 93* 93*  CO2 24 26  GLUCOSE 132* 156*  BUN 63* 42*  CREATININE 14.14* 12.14*  CALCIUM 10.3 10.4*  PHOS 6.2* 6.2*   Liver Function Tests: Recent Labs  Lab 03/01/21 0404 03/03/21 0616  ALBUMIN 3.3* 3.1*   No results for input(s): LIPASE, AMYLASE in the last 168 hours. CBC: Recent Labs  Lab 02/28/21 0409 03/03/21 0616  WBC 6.8 5.3  HGB 10.7* 10.0*  HCT 34.7* 32.3*  MCV 98.6 99.4  PLT 267 222   Blood Culture    Component Value Date/Time   SDES BREAST 02/25/2021 1242   SPECREQUEST LEFT 02/25/2021 1242   CULT  02/25/2021 1242    No growth aerobically or anaerobically. Performed at [facility], [place] [address], [place], [place] [address]    REPTSTATUS 03/02/2021 FINAL 02/25/2021 1242    Cardiac Enzymes: No results for input(s): CKTOTAL, CKMB, CKMBINDEX, TROPONINI in the last 168 hours. CBG: No results for input(s): GLUCAP in the last 168 hours. Iron Studies: No results for input(s): IRON, TIBC, TRANSFERRIN, FERRITIN in the last 72 hours. '@lablastinr3'$ @ Studies/Results: No results found. Medications: . sodium chloride    . sodium chloride 10 mL/hr at 02/27/21 0831   . (feeding supplement) PROSource Plus  30 mL Oral BID BM  . carvedilol  25 mg  Oral BID WC  . Chlorhexidine Gluconate Cloth  6 each Topical Q0600  . cloNIDine  0.3 mg Oral TID  . darbepoetin (ARANESP) injection - DIALYSIS  150 mcg Intravenous Q Mon-HD  . heparin  5,000 Units Subcutaneous Q8H  . multivitamin  1 tablet Oral QHS  . pregabalin  75 mg Oral QHS  . senna  1 tablet Oral BID  . sevelamer carbonate  3,200 mg Oral TID WC     Dialysis Orders: [place] 4h 61mn 2/2.5 bath 105kg AVF  Heparin 7000 units IV initial bolus Heparin 3000 units IV mid run.  parsabiv 15 mgIVthree times a week with HD mircera 150 mcgIVevery 2 weeks - last given 01/13/21 hectorol 3 mcgIVthree times a week  Assessment/Plan: 1.Left breast abscess -s/p I&D abscess 01/15/2021 and 02/25/21 pathology was negative for malignancy on 01/21"although some concern for possible inflammatory breast cancer"repeat biopsy done 03/03 - negative for malignancy. Now on oral clindamycin. 2.HD vascular access: Recurrentcannulationissuesw/AVF.IR completed f'gram 01/25/21 showing significant pseudoaneurysm s/p thrombin injection and near complete thrombosis of pseudoaneurysm per IR.Cannulation issues now appear to have resolved, will continue to monitor. Was on heparin prior to admission but wasn't included in orders. Will resume at lower dose.  3.Covid 19 on admission. Now out of isolation window.  5. ESRD: HD MWF. Next HD 3/14. Need updated labs.  6.  Secondary hyperparathyroidism - Phos sl elevated -continue Renvela binder. Corr Ca elevated   Use 2.0 Ca bath with HD.  7. Anemia. --HGB at goal. Continue weekly ESA.  8. HTN/volume -BP controlled, edema resolved. She isbelow EDW, will need to be lowered at d/c.Reset EDW to 98 kg.UF as tolerated.  9. Nutrition -Low Albumin. Renal diet with fld restriction. Add protein supps.  Disposition: Now plan is for hotel when medically stable. She does not wish to go to ALF/SNF although I believe the latter options would be a safer choice.  Appreciate MSW's assistance.  Lynnda Child PA-C [facility] 03/06/2021,10:26 AM

## 2021-03-06 NOTE — Progress Notes (Signed)
[facility] PROGRESS NOTE  Rebekah Burton [id number] DOB: 1976-03-24 DOA: 01/15/2021 PCP: Trey Sailors, PA  Status:  Remains inpatient appropriate because:Unsafe d/c plan and Inpatient level of care appropriate due to severity of illness   Dispo: The patient is from: Homeless              Anticipated d/c is to: ALF vs SNF vs to be determined on 3/1 patient told [facility] staff that she did not wish to pursue skilled nursing facility and instead reported desire to be set up in a hotel.  Medicaid check not available until 3/11.  If medically stable before that date we will ask [facility] supervisor to assist with payment for motel room.              Patient currently is not medically stable to d/c.  Patient has had reemergence of left breast abscess and will be requiring close follow-up as well as oral antibiotics.  Will be undergoing an ultrasound-guided aspiration of the abscess in the left breast on 3/2 and if unsuccessful may require intraoperative debridement.   Difficult to place patient Yes   Level of care: Med-Surg  Code Status: Full Family Communication:  DVT prophylaxis: SQ Heparin Vaccination status: Unknown-Incidentally Covid positive during this admission (01/15/2021)  Foley catheter: No  HPI:  45 y.o. female with PMH significant for ESRD on HD MWF, blindness, hypertension, PAD. Patient presented to the ED on 01/15/2021 with breast abscess, underwent I&D by general surgery.  She was initially treated with IV antibiotics later completed a course with oral antibiotics.  She was also found to be incidental Covid positive.  Hospital course complicated by difficulty with cannulation of aVF, vascular surgery was consulted, underwent fistulogram and was found to have pseudoaneurysm. IR and vascular were consulted. aVF is now working.Dialysis per nephrology.  Further hospital course complicated by recurrence of breast abscess-s/p ultrasound guided drainage on 3/2-and then incision and  drainage by general surgery on 3/3.  See below for further details   Subjective: Lying comfortably in bed-no major issues  Objective: Vitals:   03/06/21 0500 03/06/21 1014  BP: 118/75 110/68  Pulse: 74 80  Resp: 18 18  Temp: 98.7 F (37.1 C) 97.8 F (36.6 C)  SpO2: 100% 96%    Intake/Output Summary (Last 24 hours) at 03/06/2021 1133 Last data filed at 03/06/2021 0900 Gross per 24 hour  Intake 720 ml  Output 1500 ml  Net -780 ml   Filed Weights   03/03/21 1224 03/05/21 0750 03/05/21 1159  Weight: 98 kg 99.3 kg 97.8 kg    Exam: Gen Exam:Alert awake-not in any distress HEENT:atraumatic, normocephalic Chest: B/L clear to auscultation anteriorly CVS:S1S2 regular Abdomen:soft non tender, non distended Extremities:no edema-s/p bilateral TMA Neurology: Non focal Skin: no rash   Assessment/Plan: Breast abscess/recurrent: Lorrin Goodell leukocytosis-no longer on antibiotics General surgery continues to follow. All cultures negative to date, biopsy negative for malignancy patient is s/p repeat I&D on 3/3.  Incidental COVID-19: Off isolation-asymptomatic  ESRD: HD per nephrology.  HTN: Controlled-continue clonidine/Coreg  Hyponatremia: Mild-being managed with volume overload with dialysis.  PAD-s/p femoropopliteal bypass-s/p bilateraL TMA  Anemia of chronic disease: Monitor periodically.    Data Reviewed: Basic Metabolic Panel: Recent Labs  Lab 03/01/21 0404 03/03/21 0616  NA 134* 134*  K 4.4 3.9  CL 93* 93*  CO2 24 26  GLUCOSE 132* 156*  BUN 63* 42*  CREATININE 14.14* 12.14*  CALCIUM 10.3 10.4*  PHOS 6.2* 6.2*   Liver Function Tests:  Recent Labs  Lab 03/01/21 0404 03/03/21 0616  ALBUMIN 3.3* 3.1*   No results for input(s): LIPASE, AMYLASE in the last 168 hours. No results for input(s): AMMONIA in the last 168 hours. CBC: Recent Labs  Lab 02/28/21 0409 03/03/21 0616  WBC 6.8 5.3  HGB 10.7* 10.0*  HCT 34.7* 32.3*  MCV 98.6 99.4  PLT 267 222    Cardiac Enzymes: No results for input(s): CKTOTAL, CKMB, CKMBINDEX, TROPONINI in the last 168 hours. BNP (last 3 results) Recent Labs    01/18/21 0136 01/19/21 0030 01/20/21 0020  BNP 1,410.7* 1,122.4* 1,383.7*    ProBNP (last 3 results) No results for input(s): PROBNP in the last 8760 hours.  CBG: No results for input(s): GLUCAP in the last 168 hours.  Recent Results (from the past 240 hour(s))  Aerobic/Anaerobic Culture w Gram Stain (surgical/deep wound)     Status: None   Collection Time: 02/24/21  4:57 PM   Specimen: Abscess  Result Value Ref Range Status   Specimen Description ABSCESS BREAST  Final   Special Requests NONE  Final   Gram Stain   Final    ABUNDANT WBC PRESENT, PREDOMINANTLY PMN NO ORGANISMS SEEN    Culture   Final    FEW FINEGOLDIA MAGNA MODERATE ANAEROBIC GRAM POSITIVE COCCI ACTINOTIGNUM SCHAALII Standardized susceptibility testing for this organism is not available. Performed at [facility], [place] [address], [place], [place] [address]    Report Status 03/03/2021 FINAL  Final  Surgical pcr screen     Status: None   Collection Time: 02/25/21  9:15 AM   Specimen: Nasal Mucosa; Nasal Swab  Result Value Ref Range Status   MRSA, PCR NEGATIVE NEGATIVE Final   Staphylococcus aureus NEGATIVE NEGATIVE Final    Comment: (NOTE) The Xpert SA Assay (FDA approved for NASAL specimens in patients 59 years of age and older), is one component of a comprehensive surveillance program. It is not intended to diagnose infection nor to guide or monitor treatment. Performed at [facility], [place] [address] [place], [place] [address]   Aerobic/Anaerobic Culture w Gram Stain (surgical/deep wound)     Status: None   Collection Time: 02/25/21 12:42 PM   Specimen: PATH Cytology Misc. fluid; Body Fluid  Result Value Ref Range Status   Specimen Description BREAST  Final   Special Requests LEFT  Final   Gram Stain   Final    ABUNDANT WBC PRESENT,BOTH PMN AND  MONONUCLEAR NO ORGANISMS SEEN    Culture   Final    No growth aerobically or anaerobically. Performed at [facility], [place] [address], [place], [place] [address]    Report Status 03/02/2021 FINAL  Final     Studies: No results found.  Scheduled Meds: . (feeding supplement) PROSource Plus  30 mL Oral BID BM  . carvedilol  25 mg Oral BID WC  . Chlorhexidine Gluconate Cloth  6 each Topical Q0600  . cloNIDine  0.3 mg Oral TID  . darbepoetin (ARANESP) injection - DIALYSIS  150 mcg Intravenous Q Mon-HD  . heparin  5,000 Units Subcutaneous Q8H  . multivitamin  1 tablet Oral QHS  . pregabalin  75 mg Oral QHS  . senna  1 tablet Oral BID  . sevelamer carbonate  3,200 mg Oral TID WC   Continuous Infusions: . sodium chloride    . sodium chloride 10 mL/hr at 02/27/21 [id number]    Active Problems:   ESRD (end stage renal disease) ([place])  Breast abscess   COVID-19 virus infection   Breast abscess of female   Left breast abscess   Poor social situation   Consultants:  Nephrology  General surgery  Interventional radiology  Procedures:  Aspiration of breast abscess per IR 3/1 with further image guided aspiration of complex breast abscess planned for 3/2  Antibiotics: Anti-infectives (From admission, onward)   Start     Dose/Rate Route Frequency Ordered Stop   02/27/21 1730  clindamycin (CLEOCIN) capsule 300 mg        300 mg Oral Every 8 hours 02/27/21 1631 03/03/21 0511   02/24/21 2200  amoxicillin-clavulanate (AUGMENTIN) 500-125 MG per tablet 500 mg  Status:  Discontinued        1 tablet Oral Daily at bedtime 02/23/21 1149 02/24/21 0817   02/24/21 1200  clindamycin (CLEOCIN) capsule 300 mg  Status:  Discontinued        300 mg Oral Every 6 hours 02/24/21 0817 02/24/21 0952   02/24/21 1200  clindamycin (CLEOCIN) IVPB 600 mg  Status:  Discontinued        600 mg 100 mL/hr over 30 Minutes Intravenous Every 6 hours 02/24/21 0952 02/27/21 1631   02/23/21 1245   amoxicillin-clavulanate (AUGMENTIN) 500-125 MG per tablet 500 mg        1 tablet Oral  Once 02/23/21 1149 02/23/21 1445   02/23/21 1215  amoxicillin-clavulanate (AUGMENTIN) 875-125 MG per tablet 1 tablet  Status:  Discontinued        1 tablet Oral Every 12 hours 02/23/21 1120 02/23/21 1149   01/23/21 1400  amoxicillin-clavulanate (AUGMENTIN) 500-125 MG per tablet 500 mg        500 mg Oral Every 24 hours 01/23/21 1124 01/25/21 1657   01/23/21 1015  amoxicillin-clavulanate (AUGMENTIN) 875-125 MG per tablet 1 tablet  Status:  Discontinued        1 tablet Oral Every 12 hours 01/23/21 1002 01/23/21 1124   01/20/21 0845  vancomycin (VANCOCIN) 1-5 GM/200ML-% IVPB       Note to Pharmacy: Judieth Keens  : cabinet override      01/20/21 0845 01/20/21 2059   01/18/21 1200  vancomycin (VANCOCIN) IVPB 1000 mg/200 mL premix  Status:  Discontinued        1,000 mg 200 mL/hr over 60 Minutes Intravenous Every M-W-F (Hemodialysis) 01/16/21 1017 01/21/21 1324   01/17/21 2200  Ampicillin-Sulbactam (UNASYN) 3 g in sodium chloride 0.9 % 100 mL IVPB  Status:  Discontinued        3 g 200 mL/hr over 30 Minutes Intravenous Daily at bedtime 01/16/21 1058 01/23/21 1002   01/17/21 1000  remdesivir 100 mg in sodium chloride 0.9 % 100 mL IVPB       "Followed by" Linked Group Details   100 mg 200 mL/hr over 30 Minutes Intravenous Daily 01/15/21 2357 01/18/21 0918   01/16/21 1145  Ampicillin-Sulbactam (UNASYN) 3 g in sodium chloride 0.9 % 100 mL IVPB        3 g 200 mL/hr over 30 Minutes Intravenous  Once 01/16/21 1058 01/16/21 1417   01/16/21 1100  vancomycin (VANCOREADY) IVPB 1500 mg/300 mL        1,500 mg 150 mL/hr over 120 Minutes Intravenous  Once 01/16/21 1017 01/16/21 1849   01/16/21 0300  remdesivir 200 mg in sodium chloride 0.9% 250 mL IVPB       "Followed by" Linked Group Details   200 mg 580 mL/hr over 30 Minutes Intravenous Once 01/15/21 2357 01/16/21 0543  01/15/21 2345  amoxicillin-clavulanate  (AUGMENTIN) 875-125 MG per tablet 1 tablet  Status:  Discontinued        1 tablet Oral Every 12 hours 01/15/21 2334 01/15/21 2357   01/15/21 1800  vancomycin (VANCOCIN) IVPB 1000 mg/200 mL premix        1,000 mg 200 mL/hr over 60 Minutes Intravenous  Once 01/15/21 1753 01/15/21 2120       Time spent: 15 minutes    Bryon Parker ANP  [facility] 7 am - 330 pm/M-F for direct patient care and secure chat Please refer to [place] for contact info 50  days

## 2021-03-06 NOTE — Plan of Care (Signed)
  Problem: Pain Managment: Goal: General experience of comfort will improve Outcome: Progressing   Problem: Clinical Measurements: Goal: Complications related to the disease process, condition or treatment will be avoided or minimized Outcome: Progressing

## 2021-03-07 DIAGNOSIS — N611 Abscess of the breast and nipple: Secondary | ICD-10-CM | POA: Diagnosis not present

## 2021-03-07 NOTE — Plan of Care (Signed)
  Problem: Safety: Goal: Ability to remain free from injury will improve Outcome: Progressing   Problem: Fluid Volume: Goal: Compliance with measures to maintain balanced fluid volume will improve Outcome: Progressing   

## 2021-03-07 NOTE — Progress Notes (Signed)
[facility] Progress Note   Subjective:  Seen in room. No new complaints, talking on phone.   Objective Vitals:   03/06/21 0500 03/06/21 1014 03/06/21 1954 03/07/21 0423  BP: 118/75 110/68 (!) 104/36 130/84  Pulse: 74 80 68 75  Resp: '18 18 18 18  '$ Temp: 98.7 F (37.1 C) 97.8 F (36.6 C) (!) 97.5 F (36.4 C) 98 F (36.7 C)  TempSrc: Oral Oral Oral Oral  SpO2: 100% 96% 100% 93%  Weight:      Height:       Physical Exam General:obese female in NAD Heart:S1,S2, RRR Lungs:CTAB Abdomen:Active BS Extremities:No LE edema. Dialysis Access:L AVF + bruit   Additional Objective Labs: Basic Metabolic Panel: Recent Labs  Lab 03/01/21 0404 03/03/21 0616  NA 134* 134*  K 4.4 3.9  CL 93* 93*  CO2 24 26  GLUCOSE 132* 156*  BUN 63* 42*  CREATININE 14.14* 12.14*  CALCIUM 10.3 10.4*  PHOS 6.2* 6.2*   Liver Function Tests: Recent Labs  Lab 03/01/21 0404 03/03/21 0616  ALBUMIN 3.3* 3.1*   No results for input(s): LIPASE, AMYLASE in the last 168 hours. CBC: Recent Labs  Lab 03/03/21 0616  WBC 5.3  HGB 10.0*  HCT 32.3*  MCV 99.4  PLT 222   Blood Culture    Component Value Date/Time   SDES BREAST 02/25/2021 1242   SPECREQUEST LEFT 02/25/2021 1242   CULT  02/25/2021 1242    No growth aerobically or anaerobically. Performed at [facility], [place] [address] [place], [place] [address]    REPTSTATUS 03/02/2021 FINAL 02/25/2021 1242    Cardiac Enzymes: No results for input(s): CKTOTAL, CKMB, CKMBINDEX, TROPONINI in the last 168 hours. CBG: No results for input(s): GLUCAP in the last 168 hours. Iron Studies: No results for input(s): IRON, TIBC, TRANSFERRIN, FERRITIN in the last 72 hours. '@lablastinr3'$ @ Studies/Results: No results found. Medications: . sodium chloride    . sodium chloride 10 mL/hr at 02/27/21 0831   . (feeding supplement) PROSource Plus  30 mL Oral BID BM  . carvedilol  25 mg Oral BID WC  . Chlorhexidine Gluconate  Cloth  6 each Topical Q0600  . cloNIDine  0.3 mg Oral TID  . darbepoetin (ARANESP) injection - DIALYSIS  150 mcg Intravenous Q Mon-HD  . heparin  5,000 Units Subcutaneous Q8H  . multivitamin  1 tablet Oral QHS  . pregabalin  75 mg Oral QHS  . senna  1 tablet Oral BID  . sevelamer carbonate  3,200 mg Oral TID WC     Dialysis Orders: [facility] 4h 76mn 2/2.5 bath 105kg AVF  Heparin 7000 units IV initial bolus Heparin 3000 units IV mid run.  parsabiv 15 mgIVthree times a week with HD mircera 150 mcgIVevery 2 weeks - last given 01/13/21 hectorol 3 mcgIVthree times a week  Assessment/Plan: 1.Left breast abscess -s/p I&D abscess 01/15/2021 and 02/25/21 pathology was negative for malignancy on 01/21"although some concern for possible inflammatory breast cancer"repeat biopsy done 03/03 - negative for malignancy. Completed antibiotic course.  2.HD vascular access: Recurrentcannulationissuesw/AVF.IR completed f'gram 01/25/21 showing significant pseudoaneurysm s/p thrombin injection and near complete thrombosis of pseudoaneurysm per IR.Cannulation issues now appear to have resolved, will continue to monitor. Was on heparin prior to admission but wasn't included in orders. Will resume at lower dose.  3.Covid 19 on admission. Now out of isolation window.  5. ESRD: HD MWF. Next HD 3/14. Update labs.  6. Secondary hyperparathyroidism - Phos sl elevated -continue Renvela  binder. Corr Ca elevated   Use 2.0 Ca bath with HD.  7. Anemia. --HGB at goal. Continue weekly ESA.  8. HTN/volume -BP controlled, edema resolved. She isbelow EDW, will need to be lowered at d/c.Reset EDW to 98 kg.UF as tolerated.  9. Nutrition -Low Albumin. Renal diet with fld restriction. Add protein supps.  Disposition: Now plan is for hotel when medically stable. She does not wish to go to ALF/SNF although I believe the latter options would be a safer choice. Appreciate MSW's assistance.  Lynnda Child PA-C [facility] 03/07/2021,10:25 AM

## 2021-03-07 NOTE — Progress Notes (Signed)
[facility] PROGRESS NOTE  Nazanin Pennix [id number] DOB: January 14, 1976 DOA: 01/15/2021 PCP: Trey Sailors, PA   HPI:  45 y.o. female with PMH significant for ESRD on HD MWF, blindness, hypertension, PAD. Patient presented to the ED on 01/15/2021 with breast abscess, underwent I&D by general surgery.  She was initially treated with IV antibiotics later completed a course with oral antibiotics.  She was also found to be incidental Covid positive.  Hospital course complicated by difficulty with cannulation of aVF, vascular surgery was consulted, underwent fistulogram and was found to have pseudoaneurysm. IR and vascular were consulted. aVF is now working.Dialysis per nephrology.  Further hospital course complicated by recurrence of breast abscess-s/p ultrasound guided drainage on 3/2-and then incision and drainage by general surgery on 3/3.  See below for further details   Subjective:  Awake Alert, No new F.N deficits, Normal affect [place].AT,PERRAL Supple Neck,No JVD, No cervical lymphadenopathy appriciated.  Symmetrical Chest wall movement, Good air movement bilaterally, CTAB RRR,No Gallops, Rubs or new Murmurs, No Parasternal Heave +ve B.Sounds, Abd Soft, No tenderness, No organomegaly appriciated, No rebound - guarding or rigidity. No Cyanosis, Clubbing or edema, No new Rash or bruise   Objective: Vitals:   03/07/21 0423 03/07/21 1116  BP: 130/84 113/72  Pulse: 75 72  Resp: 18 16  Temp: 98 F (36.7 C)   SpO2: 93% 99%    Intake/Output Summary (Last 24 hours) at 03/07/2021 1135 Last data filed at 03/07/2021 0601 Gross per 24 hour  Intake 462 ml  Output --  Net 462 ml   Filed Weights   03/03/21 1224 03/05/21 0750 03/05/21 1159  Weight: 98 kg 99.3 kg 97.8 kg    Exam:  Awake Alert, No new F.N deficits, Normal affect [place].AT,PERRAL Supple Neck,No JVD, No cervical lymphadenopathy appriciated.  Symmetrical Chest wall movement, Good air movement bilaterally,  CTAB RRR,No Gallops, Rubs or new Murmurs, No Parasternal Heave +ve B.Sounds, Abd Soft, No tenderness, No organomegaly appriciated, No rebound - guarding or rigidity. No Cyanosis, bilateral transmetatarsal amputations   Assessment/Plan:  Breast abscess/recurrent: Lorrin Goodell leukocytosis-no longer on antibiotics General surgery continues to follow. All cultures negative to date, biopsy negative for malignancy patient is s/p repeat I&D x 2 this admission last on 3/3.  Incidental COVID-19: Off isolation-asymptomatic  ESRD: HD per nephrology.  HTN: Controlled-continue clonidine/Coreg  Hyponatremia: Mild-being managed with volume overload with dialysis.  PAD-s/p femoropopliteal bypass-s/p bilateraL TMA  Anemia of chronic disease: Monitor periodically.     Status:  Remains inpatient appropriate because:Unsafe d/c plan and Inpatient level of care appropriate due to severity of illness   Dispo: The patient is from: Homeless              Anticipated d/c is to: ALF vs SNF vs to be determined on 3/1 patient told [facility] staff that she did not wish to pursue skilled nursing facility and instead reported desire to be set up in a hotel.  Medicaid check not available until 3/11.  If medically stable before that date we will ask [facility] supervisor to assist with payment for motel room.              Patient currently is not medically stable to d/c.  Patient has had reemergence of left breast abscess and will be requiring close follow-up as well as oral antibiotics.  Will be undergoing an ultrasound-guided aspiration of the abscess in the left breast on 3/2 and if unsuccessful may require intraoperative debridement.   Difficult to place patient Yes  Level of care: Med-Surg  Code Status: Full Family Communication:  DVT prophylaxis: SQ Heparin Vaccination status: Unknown-Incidentally Covid positive during this admission (01/15/2021)  Foley catheter: No  Data Reviewed: Basic Metabolic  Panel: Recent Labs  Lab 03/01/21 0404 03/03/21 0616  NA 134* 134*  K 4.4 3.9  CL 93* 93*  CO2 24 26  GLUCOSE 132* 156*  BUN 63* 42*  CREATININE 14.14* 12.14*  CALCIUM 10.3 10.4*  PHOS 6.2* 6.2*   Liver Function Tests: Recent Labs  Lab 03/01/21 0404 03/03/21 0616  ALBUMIN 3.3* 3.1*   No results for input(s): LIPASE, AMYLASE in the last 168 hours. No results for input(s): AMMONIA in the last 168 hours. CBC: Recent Labs  Lab 03/03/21 0616  WBC 5.3  HGB 10.0*  HCT 32.3*  MCV 99.4  PLT 222   Cardiac Enzymes: No results for input(s): CKTOTAL, CKMB, CKMBINDEX, TROPONINI in the last 168 hours. BNP (last 3 results) Recent Labs    01/18/21 0136 01/19/21 0030 01/20/21 0020  BNP 1,410.7* 1,122.4* 1,383.7*    ProBNP (last 3 results) No results for input(s): PROBNP in the last 8760 hours.  CBG: No results for input(s): GLUCAP in the last 168 hours.  Recent Results (from the past 240 hour(s))  Aerobic/Anaerobic Culture w Gram Stain (surgical/deep wound)     Status: None   Collection Time: 02/25/21 12:42 PM   Specimen: PATH Cytology Misc. fluid; Body Fluid  Result Value Ref Range Status   Specimen Description BREAST  Final   Special Requests LEFT  Final   Gram Stain   Final    ABUNDANT WBC PRESENT,BOTH PMN AND MONONUCLEAR NO ORGANISMS SEEN    Culture   Final    No growth aerobically or anaerobically. Performed at [facility], [place] [address], [place], [place] [address]    Report Status 03/02/2021 FINAL  Final     Studies: No results found.  Scheduled Meds: . (feeding supplement) PROSource Plus  30 mL Oral BID BM  . carvedilol  25 mg Oral BID WC  . Chlorhexidine Gluconate Cloth  6 each Topical Q0600  . cloNIDine  0.3 mg Oral TID  . darbepoetin (ARANESP) injection - DIALYSIS  150 mcg Intravenous Q Mon-HD  . heparin  5,000 Units Subcutaneous Q8H  . multivitamin  1 tablet Oral QHS  . pregabalin  75 mg Oral QHS  . senna  1 tablet Oral BID  .  sevelamer carbonate  3,200 mg Oral TID WC   Continuous Infusions: . sodium chloride    . sodium chloride 10 mL/hr at 02/27/21 [id number]    Active Problems:   ESRD (end stage renal disease) ([place])   Breast abscess   COVID-19 virus infection   Breast abscess of female   Left breast abscess   Poor social situation   Consultants:  Nephrology  General surgery  Interventional radiology  Procedures:  Aspiration of breast abscess per IR 3/1 with further image guided aspiration of complex breast abscess planned for 3/2  Antibiotics: Anti-infectives (From admission, onward)   Start     Dose/Rate Route Frequency Ordered Stop   02/27/21 1730  clindamycin (CLEOCIN) capsule 300 mg        300 mg Oral Every 8 hours 02/27/21 1631 03/03/21 0511   02/24/21 2200  amoxicillin-clavulanate (AUGMENTIN) 500-125 MG per tablet 500 mg  Status:  Discontinued        1 tablet Oral Daily at bedtime 02/23/21 1149 02/24/21 0817   02/24/21 1200  clindamycin (  CLEOCIN) capsule 300 mg  Status:  Discontinued        300 mg Oral Every 6 hours 02/24/21 0817 02/24/21 0952   02/24/21 1200  clindamycin (CLEOCIN) IVPB 600 mg  Status:  Discontinued        600 mg 100 mL/hr over 30 Minutes Intravenous Every 6 hours 02/24/21 0952 02/27/21 1631   02/23/21 1245  amoxicillin-clavulanate (AUGMENTIN) 500-125 MG per tablet 500 mg        1 tablet Oral  Once 02/23/21 1149 02/23/21 1445   02/23/21 1215  amoxicillin-clavulanate (AUGMENTIN) 875-125 MG per tablet 1 tablet  Status:  Discontinued        1 tablet Oral Every 12 hours 02/23/21 1120 02/23/21 1149   01/23/21 1400  amoxicillin-clavulanate (AUGMENTIN) 500-125 MG per tablet 500 mg        500 mg Oral Every 24 hours 01/23/21 1124 01/25/21 1657   01/23/21 1015  amoxicillin-clavulanate (AUGMENTIN) 875-125 MG per tablet 1 tablet  Status:  Discontinued        1 tablet Oral Every 12 hours 01/23/21 1002 01/23/21 1124   01/20/21 0845  vancomycin (VANCOCIN) 1-5 GM/200ML-% IVPB       Note  to Pharmacy: Judieth Keens  : cabinet override      01/20/21 0845 01/20/21 2059   01/18/21 1200  vancomycin (VANCOCIN) IVPB 1000 mg/200 mL premix  Status:  Discontinued        1,000 mg 200 mL/hr over 60 Minutes Intravenous Every M-W-F (Hemodialysis) 01/16/21 1017 01/21/21 1324   01/17/21 2200  Ampicillin-Sulbactam (UNASYN) 3 g in sodium chloride 0.9 % 100 mL IVPB  Status:  Discontinued        3 g 200 mL/hr over 30 Minutes Intravenous Daily at bedtime 01/16/21 1058 01/23/21 1002   01/17/21 1000  remdesivir 100 mg in sodium chloride 0.9 % 100 mL IVPB       "Followed by" Linked Group Details   100 mg 200 mL/hr over 30 Minutes Intravenous Daily 01/15/21 2357 01/18/21 0918   01/16/21 1145  Ampicillin-Sulbactam (UNASYN) 3 g in sodium chloride 0.9 % 100 mL IVPB        3 g 200 mL/hr over 30 Minutes Intravenous  Once 01/16/21 1058 01/16/21 1417   01/16/21 1100  vancomycin (VANCOREADY) IVPB 1500 mg/300 mL        1,500 mg 150 mL/hr over 120 Minutes Intravenous  Once 01/16/21 1017 01/16/21 1849   01/16/21 0300  remdesivir 200 mg in sodium chloride 0.9% 250 mL IVPB       "Followed by" Linked Group Details   200 mg 580 mL/hr over 30 Minutes Intravenous Once 01/15/21 2357 01/16/21 0543   01/15/21 2345  amoxicillin-clavulanate (AUGMENTIN) 875-125 MG per tablet 1 tablet  Status:  Discontinued        1 tablet Oral Every 12 hours 01/15/21 2334 01/15/21 2357   01/15/21 1800  vancomycin (VANCOCIN) IVPB 1000 mg/200 mL premix        1,000 mg 200 mL/hr over 60 Minutes Intravenous  Once 01/15/21 1753 01/15/21 2120       Time spent: 15 minutes  51  days  Signature  Lala Lund M.D on 03/07/2021 at 11:35 AM   -  To page go to [email or web address]

## 2021-03-08 ENCOUNTER — Other Ambulatory Visit: Payer: Self-pay | Admitting: Nurse Practitioner

## 2021-03-08 DIAGNOSIS — Z659 Problem related to unspecified psychosocial circumstances: Secondary | ICD-10-CM | POA: Diagnosis not present

## 2021-03-08 DIAGNOSIS — N611 Abscess of the breast and nipple: Secondary | ICD-10-CM | POA: Diagnosis not present

## 2021-03-08 DIAGNOSIS — N186 End stage renal disease: Secondary | ICD-10-CM | POA: Diagnosis not present

## 2021-03-08 DIAGNOSIS — U071 COVID-19: Secondary | ICD-10-CM | POA: Diagnosis not present

## 2021-03-08 MED ORDER — OXYCODONE HCL 15 MG PO TABS: 15.0000 mg | ORAL_TABLET | ORAL | 0 refills | Status: DC | PRN

## 2021-03-08 MED ORDER — PREGABALIN 75 MG PO CAPS: 75.0000 mg | ORAL_CAPSULE | Freq: Every day | ORAL | 0 refills | Status: DC

## 2021-03-08 MED ORDER — ASPIRIN 81 MG PO TBEC: 81.0000 mg | DELAYED_RELEASE_TABLET | Freq: Every day | ORAL | 12 refills | Status: DC

## 2021-03-08 MED ORDER — SENNA 8.6 MG PO TABS: 1.0000 | ORAL_TABLET | Freq: Two times a day (BID) | ORAL | 0 refills | Status: DC

## 2021-03-08 MED ORDER — DARBEPOETIN ALFA 150 MCG/0.3ML IJ SOSY
PREFILLED_SYRINGE | INTRAMUSCULAR | Status: AC
  Administered 2021-03-08: 150 ug via INTRAVENOUS
  Filled 2021-03-08: qty 0.3

## 2021-03-08 MED ORDER — ACETAMINOPHEN 325 MG PO TABS: 650.0000 mg | ORAL_TABLET | Freq: Four times a day (QID) | ORAL | Status: DC | PRN

## 2021-03-08 NOTE — Progress Notes (Addendum)
[facility] PROGRESS NOTE  Jessical Tysinger [id number] DOB: 10-29-1976 DOA: 01/15/2021 PCP: Trey Sailors, PA  Status: Remains inpatient appropriate because:Unsafe d/c plan and Inpatient level of care appropriate due to severity of illness   Dispo: The patient is from: Homeless              Anticipated d/c is to: SNF per patient request.  Unfortunately patient only receives $800 per month and disability funds and this is far too low to pay for an appropriate long-term stay apartment until she is able to locate more permanent housing.  She also has some needs that preclude her from immediately discharging home independently.  At this time patient agrees for SNF placement.              Patient currently is medically stable to d/c.    Difficult to place patient Yes   Level of care: Med-Surg  Code Status: Full Family Communication:  DVT prophylaxis: SQ Heparin Vaccination status: Unknown-Incidentally Covid positive during this admission (01/15/2021)  Foley catheter: No  HPI:  45 y.o. female with PMH significant for ESRD on HD MWF, blindness, hypertension, PAD. Patient presented to the ED on 01/15/2021 with breast abscess, underwent I&D by general surgery.  She was initially treated with IV antibiotics later completed a course with oral antibiotics.  She was also found to be incidental Covid positive. Now she is off contact precautions. 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 IR and vascular were consulted. aVF is now working.Dialysis per nephrology.   Subjective: Patient now extremely reluctant to discharge to [facility].  She is agreeable at this time to initial recommendation for placement in a skilled nursing facility.  Objective: Vitals:   03/07/21 1700 03/08/21 0428  BP: 115/74 119/74  Pulse: 75 75  Resp: 16 18  Temp: 98.3 F (36.8 C) 98 F (36.7 C)  SpO2: 99%  100%    Intake/Output Summary (Last 24 hours) at 03/08/2021 0816 Last data filed at 03/08/2021 0200 Gross per 24 hour  Intake 600 ml  Output 0 ml  Net 600 ml   Filed Weights   03/03/21 1224 03/05/21 0750 03/05/21 1159  Weight: 98 kg 99.3 kg 97.8 kg    Exam:  Constitutional: Alert, no acute distress and pleasant Breast: Left breast mains tender at the nipple region but soft Respiratory: Lungs are clear to auscultation posteriorly, room air Cardiac: Heart sounds are S1-S2, no peripheral edema, extremities warm to touch, pulse regular Abdomen: Bowel sounds are present.  Tolerating diet.  LBM 3/14 Neurologic: CN 2-12 grossly intact except for known blindness/visual impairment. Sensation intact, DTR normal. Strength 5/5 x all 4 extremities.  Ext: Prior bilateral transmetatarsal amputations Psychiatric: Alert and oriented x3.  Pleasant mood   Assessment/Plan: Acute problems: Breast abscess/recurrent -s/p incision drainage done by general surgery.-Pathology without any evidence of malignancy -She was started on Unasyn, cultures grew gram-positive cocci. She was transitioned to Augmentin and completed recommended duration of therapy -On 3/1 reemergent breast abscess.  General surgery consulted and patient was resumed on Augmentin -3/2 US guided aspiration of complex fluid collection yielded minimal fluid returns -3/3 underwent intraoperative surgical debridement as well as biopsy to rule out underlying malignancy-pathology negative for malignancy and cultures negative for growth; on 3/8 surgery cleared from a surgical standpoint for discharge. -Continue OxyIR 15 mg every 4 hours as needed plus Tylenol 325 mg every 4 hours as needed -  continue IV morphine prn for breakthrough pain -3/10 added Lyrica 75 mg daily as adjunct for pain management significant improvement in pain.  Of note her primary insurance requires preauthorization for Lyrica but Medicaid will pay for this medication at $4  per prescription  Incidental COVID-19 -patient is asymptomatic, no evidence of pulmonary involvement.  -Off isolation.Currently on room air.  ESRD -nephrology was consulted, had issues with cannulation of aVF. IR and vascular was consulted and underwent fistulogram demonstrated pseudoaneurysm.  -AVF is now working.  -Dialysis per nephrology.  -Continue sevelamer.  Anemia of chronic disease -monitor intermittently, hemoglobin is stable at 9.1. Continue Aranesp.   Other problems: Hyponatremia -patient on hemodialysis, nephrology following. Last Labs         Recent Labs  Lab 02/16/21 1204 02/17/21 1312 02/19/21 0837 02/22/21 0852  NA 132* 134* 135 133*     Hypertension -Blood pressure controlled on clonidine and Coreg.  Peripheral artery disease -hx of Fem-pop bypass. -Has history of left transmetatarsal amputation.    Data Reviewed: Basic Metabolic Panel: Recent Labs  Lab 03/03/21 0616  NA 134*  K 3.9  CL 93*  CO2 26  GLUCOSE 156*  BUN 42*  CREATININE 12.14*  CALCIUM 10.4*  PHOS 6.2*   Liver Function Tests: Recent Labs  Lab 03/03/21 0616  ALBUMIN 3.1*   No results for input(s): LIPASE, AMYLASE in the last 168 hours. No results for input(s): AMMONIA in the last 168 hours. CBC: Recent Labs  Lab 03/03/21 0616  WBC 5.3  HGB 10.0*  HCT 32.3*  MCV 99.4  PLT 222   Cardiac Enzymes: No results for input(s): CKTOTAL, CKMB, CKMBINDEX, TROPONINI in the last 168 hours. BNP (last 3 results) Recent Labs    01/18/21 0136 01/19/21 0030 01/20/21 0020  BNP 1,410.7* 1,122.4* 1,383.7*    ProBNP (last 3 results) No results for input(s): PROBNP in the last 8760 hours.  CBG: No results for input(s): GLUCAP in the last 168 hours.  No results found for this or any previous visit (from the past 240 hour(s)).   Studies: No results found.  Scheduled Meds: . (feeding supplement) PROSource Plus  30 mL Oral BID BM  . carvedilol  25 mg Oral BID  WC  . Chlorhexidine Gluconate Cloth  6 each Topical Q0600  . cloNIDine  0.3 mg Oral TID  . darbepoetin (ARANESP) injection - DIALYSIS  150 mcg Intravenous Q Mon-HD  . heparin  5,000 Units Subcutaneous Q8H  . multivitamin  1 tablet Oral QHS  . pregabalin  75 mg Oral QHS  . senna  1 tablet Oral BID  . sevelamer carbonate  3,200 mg Oral TID WC   Continuous Infusions: . sodium chloride    . sodium chloride 10 mL/hr at 02/27/21 [id number]    Active Problems:   ESRD (end stage renal disease) ([place])   Breast abscess   COVID-19 virus infection   Breast abscess of female   Left breast abscess   Poor social situation   Consultants:  Nephrology  General surgery  Interventional radiology  Procedures:  Aspiration of breast abscess per IR 3/1 with further image guided aspiration of complex breast abscess planned for 3/2  Antibiotics: Anti-infectives (From admission, onward)   Start     Dose/Rate Route Frequency Ordered Stop   02/27/21 1730  clindamycin (CLEOCIN) capsule 300 mg        300 mg Oral Every 8 hours 02/27/21 1631 03/03/21 0511   02/24/21 2200  amoxicillin-clavulanate (AUGMENTIN) 500-125 MG per  tablet 500 mg  Status:  Discontinued        1 tablet Oral Daily at bedtime 02/23/21 1149 02/24/21 0817   02/24/21 1200  clindamycin (CLEOCIN) capsule 300 mg  Status:  Discontinued        300 mg Oral Every 6 hours 02/24/21 0817 02/24/21 0952   02/24/21 1200  clindamycin (CLEOCIN) IVPB 600 mg  Status:  Discontinued        600 mg 100 mL/hr over 30 Minutes Intravenous Every 6 hours 02/24/21 0952 02/27/21 1631   02/23/21 1245  amoxicillin-clavulanate (AUGMENTIN) 500-125 MG per tablet 500 mg        1 tablet Oral  Once 02/23/21 1149 02/23/21 1445   02/23/21 1215  amoxicillin-clavulanate (AUGMENTIN) 875-125 MG per tablet 1 tablet  Status:  Discontinued        1 tablet Oral Every 12 hours 02/23/21 1120 02/23/21 1149   01/23/21 1400  amoxicillin-clavulanate (AUGMENTIN) 500-125 MG per tablet 500  mg        500 mg Oral Every 24 hours 01/23/21 1124 01/25/21 1657   01/23/21 1015  amoxicillin-clavulanate (AUGMENTIN) 875-125 MG per tablet 1 tablet  Status:  Discontinued        1 tablet Oral Every 12 hours 01/23/21 1002 01/23/21 1124   01/20/21 0845  vancomycin (VANCOCIN) 1-5 GM/200ML-% IVPB       Note to Pharmacy: Judieth Keens  : cabinet override      01/20/21 0845 01/20/21 2059   01/18/21 1200  vancomycin (VANCOCIN) IVPB 1000 mg/200 mL premix  Status:  Discontinued        1,000 mg 200 mL/hr over 60 Minutes Intravenous Every M-W-F (Hemodialysis) 01/16/21 1017 01/21/21 1324   01/17/21 2200  Ampicillin-Sulbactam (UNASYN) 3 g in sodium chloride 0.9 % 100 mL IVPB  Status:  Discontinued        3 g 200 mL/hr over 30 Minutes Intravenous Daily at bedtime 01/16/21 1058 01/23/21 1002   01/17/21 1000  remdesivir 100 mg in sodium chloride 0.9 % 100 mL IVPB       "Followed by" Linked Group Details   100 mg 200 mL/hr over 30 Minutes Intravenous Daily 01/15/21 2357 01/18/21 0918   01/16/21 1145  Ampicillin-Sulbactam (UNASYN) 3 g in sodium chloride 0.9 % 100 mL IVPB        3 g 200 mL/hr over 30 Minutes Intravenous  Once 01/16/21 1058 01/16/21 1417   01/16/21 1100  vancomycin (VANCOREADY) IVPB 1500 mg/300 mL        1,500 mg 150 mL/hr over 120 Minutes Intravenous  Once 01/16/21 1017 01/16/21 1849   01/16/21 0300  remdesivir 200 mg in sodium chloride 0.9% 250 mL IVPB       "Followed by" Linked Group Details   200 mg 580 mL/hr over 30 Minutes Intravenous Once 01/15/21 2357 01/16/21 0543   01/15/21 2345  amoxicillin-clavulanate (AUGMENTIN) 875-125 MG per tablet 1 tablet  Status:  Discontinued        1 tablet Oral Every 12 hours 01/15/21 2334 01/15/21 2357   01/15/21 1800  vancomycin (VANCOCIN) IVPB 1000 mg/200 mL premix        1,000 mg 200 mL/hr over 60 Minutes Intravenous  Once 01/15/21 1753 01/15/21 2120       Time spent: 20 minutes    Erin Hearing ANP  [facility] 7 am - 330  pm/M-F for direct patient care and secure chat Please refer to Amion for contact info 52  days

## 2021-03-08 NOTE — TOC Progression Note (Addendum)
Transition of Care [facility]) - Progression Note    Patient Details  Name: Rebekah Burton MRN: [id number] Date of Birth: October 11, 1976  Transition of Care [facility]) CM/SW [place], RN Phone Number: 03/08/2021, 10:37 AM  Clinical Narrative:    Case management called and spoke with [facility] [phone] representative to have the patient's pending address changed to the [facility] at [address], [place], [place] [address].  TOC team spoke with the patient and she is aware that prescriptions will be called in the [place] for future medications to the [facility] address.   CM and MSW will continue to follow the patient for pending discharge Barrett Henle in [place] for tomorrow.    03/08/2021 1315 - Melina Copa, CM at [facility] to check on availability to provide home health RN and MSW to the patient.  [place] to return my call.   - 03/08/2021 1336 - [facility] unable to supply staffing for home health services at this time.  03/08/2021 [place], CM with [facility] to check on home health availability as well.  Message left with [facility]  - home health.  CM will continue to follow for discharge plans to [facility] tomorrow.   Expected Discharge Plan: Home/Self Care Barriers to Discharge: Continued Medical Work up  Expected Discharge Plan and Services Expected Discharge Plan: Home/Self Care In-house Referral: Clinical Social Work Discharge Planning Services: CM Consult Post Acute Care Choice: Dialysis Living arrangements for the past 2 months: Single Family Home                 DME Arranged: N/A DME Agency: NA       HH Arranged: RN [place] Agency: [facility] Date HH Agency Contacted: 01/21/21 Time [place]: [id number] Representative spoke with at [place]: [place] Interventions    Readmission Risk Interventions No flowsheet data found.

## 2021-03-08 NOTE — Consult Note (Signed)
   [facility] CM Inpatient Consult   03/08/2021  Rebekah Burton 03/24/1976 [id number]   Rule Organization [ACO] Patient: Rebekah Burton  Referral request received from inpatient TOC LCSW from 03/07/21 stating patient blind and living in a hotel, no information on request. This Probation officer reached out to current inpatient TOC LCSW via secure chat with no reply at this time for more information on request and patient needs.  Patient has been found to be on the latest [facility] roster.   Plan:  Will attempt to follow up with inpatient [facility] team for more details on needs.  3:01 pm Call received from inpatient [facility] RNCM, Rebekah Burton, regarding patient referral for potential [facility] Care Management needs for community follow up support.  Rebekah Burton states that patient had needs for community support as she has been at the hospital over 50 days and was previously getting dressing changes but this is no longer an issue, patient is legally blind but able to provide self care so home health was no longer needed, and that TOC is trying to arrange a hotel for post hospital disposition. Patient goes to HD 3 times a week and has [employer] for transportation. She has a friend that will assist her in grocery shopping.  She states patient is basically homeless that she had a  roommate prior to admission however the roommate returned to [place]. She states patient is now with the difficult to place [DTP] TOC team will follow up and reach out to DTP inpatient TOC RNCM.  4:00 pm Chart reviewed and patient is for a hotel and it appears home health has been ordered, will attempt to reach out to the [facility] team again.  Plan: Will follow up for more information to assist with needs.    Natividad Brood, RN BSN [facility] Liaison  [phone] business mobile phone Toll free office [phone]  Fax number:  [phone] [email or web address]

## 2021-03-08 NOTE — Progress Notes (Addendum)
CSW spoke with patient at bedside regarding her financial situation - patient has been unsuccessful in obtaining her card from her friend. Patient states she will contact her friend to obtain card information. CSW informed patient she is ready for discharge so we have to come up with a discharge plan quickly - she stated understanding and agreement.  CSW made referral to [facility] for case management services.  CSW spoke with Legrand Como at [facility] requested patient's information be emailed to him for review. Legrand Como reports he will check to determine if there is hotel availability. Information was emailed to [employer] @ [email or web address].  Madilyn Fireman, MSW, LCSW Transitions of Care  Clinical Social Worker II [phone]

## 2021-03-08 NOTE — Progress Notes (Signed)
[facility] Progress Note   Subjective: Remains in hospital while attempting to create safe discharge plan. So far plan is to go to hotel. Appreciate MSW/Social Work assistance. Seen in room. Says breast pain improved. HD today on schedule   Objective Vitals:   03/07/21 1116 03/07/21 1700 03/08/21 0428 03/08/21 0951  BP: 113/72 115/74 119/74 127/77  Pulse: 72 75 75 72  Resp: '16 16 18 14  '$ Temp: 98.2 F (36.8 C) 98.3 F (36.8 C) 98 F (36.7 C) 98.6 F (37 C)  TempSrc: Oral Oral Oral   SpO2: 99% 99% 100% 95%  Weight:      Height:       Physical Exam General:obese female in NAD Heart:S1,S2, RRR no M/R/B Lungs:CTAB A/P Abdomen:Active BS Extremities:No LE edema. Dialysis Access:L AVF + bruit   Additional Objective Labs: Basic Metabolic Panel: Recent Labs  Lab 03/03/21 0616  NA 134*  K 3.9  CL 93*  CO2 26  GLUCOSE 156*  BUN 42*  CREATININE 12.14*  CALCIUM 10.4*  PHOS 6.2*   Liver Function Tests: Recent Labs  Lab 03/03/21 0616  ALBUMIN 3.1*   No results for input(s): LIPASE, AMYLASE in the last 168 hours. CBC: Recent Labs  Lab 03/03/21 0616  WBC 5.3  HGB 10.0*  HCT 32.3*  MCV 99.4  PLT 222   Blood Culture    Component Value Date/Time   SDES BREAST 02/25/2021 1242   SPECREQUEST LEFT 02/25/2021 1242   CULT  02/25/2021 1242    No growth aerobically or anaerobically. Performed at [facility], [place] [address], [place], [place] [address]    REPTSTATUS 03/02/2021 FINAL 02/25/2021 1242    Cardiac Enzymes: No results for input(s): CKTOTAL, CKMB, CKMBINDEX, TROPONINI in the last 168 hours. CBG: No results for input(s): GLUCAP in the last 168 hours. Iron Studies: No results for input(s): IRON, TIBC, TRANSFERRIN, FERRITIN in the last 72 hours. '@lablastinr3'$ @ Studies/Results: No results found. Medications: . sodium chloride    . sodium chloride 10 mL/hr at 02/27/21 0831   . (feeding supplement) PROSource Plus  30 mL Oral  BID BM  . carvedilol  25 mg Oral BID WC  . Chlorhexidine Gluconate Cloth  6 each Topical Q0600  . cloNIDine  0.3 mg Oral TID  . darbepoetin (ARANESP) injection - DIALYSIS  150 mcg Intravenous Q Mon-HD  . heparin  5,000 Units Subcutaneous Q8H  . multivitamin  1 tablet Oral QHS  . pregabalin  75 mg Oral QHS  . senna  1 tablet Oral BID  . sevelamer carbonate  3,200 mg Oral TID WC     Dialysis Orders: [place] 4h 15mn 2/2.5 bath 105kg AVF  Heparin 7000 units IV initial bolus Heparin 3000 units IV mid run.  parsabiv 15 mgIVthree times a week with HD mircera 150 mcgIVevery 2 weeks - last given 01/13/21 hectorol 3 mcgIVthree times a week  Assessment/Plan: 1.Left breast abscess -s/p I&D abscess 01/15/2021 and 02/25/21 pathology was negative for malignancy on 01/21"although some concern for possible inflammatory breast cancer"repeat biopsy done 03/03 - negative for malignancy. Completed antibiotic course.  2.HD vascular access: Recurrentcannulationissuesw/AVF.IR completed f'gram 01/25/21 showing significant pseudoaneurysm s/p thrombin injection and near complete thrombosis of pseudoaneurysm per IR.Cannulation issues now appear to have resolved, will continue to monitor. Was on heparin prior to admission but wasn't included in orders. Will resume at lower dose.  3.Covid 19 on admission. Now out of isolation window.  5. ESRD: HD MWF. Next HD 3/14. Update labs.  6. Secondary hyperparathyroidism - Phos sl elevated -continue Renvela binder. Corr Ca elevated   Use 2.0 Ca bath with HD.  7. Anemia. --HGB at goal. Continue weekly ESA.  8. HTN/volume -BP controlled, edema resolved. She isbelow EDW, will need to be lowered at d/c.Reset EDW to 98 kg.UF as tolerated.  9. Nutrition -Low Albumin. Renal diet with fld restriction. Add protein supps.  Disposition: Now plan is for hotel when medically stable. She does not wish to go to ALF/SNF although I believe the latter options  would be a safer choice. Appreciate MSW's assistance.  Caio Devera H. Axiel Fjeld NP-C 03/08/2021, 11:23 AM  [employer] [phone]

## 2021-03-09 DIAGNOSIS — U071 COVID-19: Secondary | ICD-10-CM | POA: Diagnosis not present

## 2021-03-09 DIAGNOSIS — Z659 Problem related to unspecified psychosocial circumstances: Secondary | ICD-10-CM | POA: Diagnosis not present

## 2021-03-09 DIAGNOSIS — N186 End stage renal disease: Secondary | ICD-10-CM | POA: Diagnosis not present

## 2021-03-09 DIAGNOSIS — N611 Abscess of the breast and nipple: Secondary | ICD-10-CM | POA: Diagnosis not present

## 2021-03-09 NOTE — Progress Notes (Signed)
[facility] Progress Note   Subjective: Seen in room. Has decided to not go to hotel. Now MSW working on SNF placement.   Objective Vitals:   03/08/21 2025 03/08/21 2057 03/09/21 0446 03/09/21 0916  BP: 137/71 136/84 117/65 (!) 97/54  Pulse:  76 78 (!) 58  Resp: '16 16 16 17  '$ Temp: 98.3 F (36.8 C) 98 F (36.7 C) 98.5 F (36.9 C) 98.1 F (36.7 C)  TempSrc: Oral Oral    SpO2: 96% 98% 99% 99%  Weight: 100.9 kg     Height:       Physical Exam General:obese female in NAD Heart:S1,S2, RRR no M/R/B Lungs:CTAB A/P Abdomen:Active BS Extremities:No LE edema. Dialysis Access:L AVF + bruit   Additional Objective Labs: Basic Metabolic Panel: Recent Labs  Lab 03/03/21 0616  NA 134*  K 3.9  CL 93*  CO2 26  GLUCOSE 156*  BUN 42*  CREATININE 12.14*  CALCIUM 10.4*  PHOS 6.2*   Liver Function Tests: Recent Labs  Lab 03/03/21 0616  ALBUMIN 3.1*   No results for input(s): LIPASE, AMYLASE in the last 168 hours. CBC: Recent Labs  Lab 03/03/21 0616  WBC 5.3  HGB 10.0*  HCT 32.3*  MCV 99.4  PLT 222   Blood Culture    Component Value Date/Time   SDES BREAST 02/25/2021 1242   SPECREQUEST LEFT 02/25/2021 1242   CULT  02/25/2021 1242    No growth aerobically or anaerobically. Performed at [facility], [place] [address], [place], [place] [address]    REPTSTATUS 03/02/2021 FINAL 02/25/2021 1242    Cardiac Enzymes: No results for input(s): CKTOTAL, CKMB, CKMBINDEX, TROPONINI in the last 168 hours. CBG: No results for input(s): GLUCAP in the last 168 hours. Iron Studies: No results for input(s): IRON, TIBC, TRANSFERRIN, FERRITIN in the last 72 hours. '@lablastinr3'$ @ Studies/Results: No results found. Medications: . sodium chloride    . sodium chloride 10 mL/hr at 02/27/21 0831   . (feeding supplement) PROSource Plus  30 mL Oral BID BM  . carvedilol  25 mg Oral BID WC  . Chlorhexidine Gluconate Cloth  6 each Topical Q0600  . cloNIDine   0.3 mg Oral TID  . darbepoetin (ARANESP) injection - DIALYSIS  150 mcg Intravenous Q Mon-HD  . heparin  5,000 Units Subcutaneous Q8H  . multivitamin  1 tablet Oral QHS  . pregabalin  75 mg Oral QHS  . senna  1 tablet Oral BID  . sevelamer carbonate  3,200 mg Oral TID WC     Dialysis Orders: [place] 4h 43mn 2/2.5 bath 105kg AVF  Heparin 7000 units IV initial bolus Heparin 3000 units IV mid run.  parsabiv 15 mgIVthree times a week with HD mircera 150 mcgIVevery 2 weeks - last given 01/13/21 hectorol 3 mcgIVthree times a week  Assessment/Plan: 1.Left breast abscess -s/p I&D abscess 01/15/2021 and 02/25/21 pathology was negative for malignancy on 01/21"although some concern for possible inflammatory breast cancer"repeat biopsy done 03/03 - negative for malignancy.Completed antibiotic course. 2.HD vascular access: Recurrentcannulationissuesw/AVF.IR completed f'gram 01/25/21 showing significant pseudoaneurysm s/p thrombin injection and near complete thrombosis of pseudoaneurysm per IR.Cannulation issues now appear to have resolved, will continue to monitor. Was on heparin prior to admission but wasn't included in orders. Will resume at lower dose.  3.Covid 19 on admission. Now out of isolation window.  5. ESRD: HD MWF. Next HD 3/16.Update labs. 6. Secondary hyperparathyroidism - Phos sl elevated -continue Renvela binder. Corr Ca elevated Use 2.0 Ca bath  with HD.  7. Anemia. --HGB at goal. Continue weekly ESA.  8. HTN/volume -BP controlled, edema resolved. She isbelow EDW, will need to be lowered at d/c.Reset EDW to 98 kg.UF as tolerated.  9. Nutrition -Low Albumin. Renal diet with fld restriction. Add protein supps.  Disposition: Patient has now decided that SNF would be safer option. Appreciate MSW for assisting in placement.   Carlous Olivares H. Keyli Duross NP-C 03/09/2021, 10:44 AM  [employer] [phone]

## 2021-03-09 NOTE — Progress Notes (Signed)
Patient refused bed alarm. States " I've been going to the bathroom by myself"

## 2021-03-09 NOTE — Progress Notes (Addendum)
3pm: CSW and RN CM spoke with admissions staff at [facility] to discuss patient in detail.  CSW confirmed with patient that she is willing to forfeit her check to facility and she states yes.  1pm: RN CM spoke with [place] at [facility] who states she is willing to review this patient - clinicals sent via [place].  No beds available at [facility] or [facility].  12pm: [facility] is at capacity for HD patient's at this time so no bed offer will be made.  10am: CSW returned to patient's bedside to present her with options - patient states interest in going to a SNF for LTC until her Section is approved. Patient agreeable for CSW to intiiate search.  CSW spoke with Rolla Plate at [facility] - she is agreeable to review patient's clinicals.   8:40am: CSW spoke with patient at bedside - patient expresses her concerns regarding discharge to the [facility]. Patient reports she spoke with the social worker at her dialysis clinic, Louretta Shorten who states the hotel is unsafe. Patient reports she is unable to live with her friend Jonelle Sidle due to Jonelle Sidle being married with five children.  CSW spoke with receptionist at [facility] in [place] - the price for one month is $1,910 (not including taxes and fees).  CSW spoke with receptionist at [facility] on [address] - the price for one month is $1,1413 (including taxes and fees).  CSW spoke with [place] from [facility] to discuss the patient's current need for shelter.  CSW spoke with receptionist at [facility] for [facility] who states the waiting list is four months out.  Madilyn Fireman, MSW, LCSW Transitions of Care  Clinical Social Worker II [phone]

## 2021-03-09 NOTE — Progress Notes (Signed)
Physical Therapy Treatment Patient Details Name: Rebekah Burton MRN: [id number] DOB: [birth year] Today's Date: 03/09/2021    History of Present Illness 45 y.o. female with developed a breast abscess and was seen in ED 01/04/21 where U/S confirmed abscess. She was to have been seen as outpatient for aspiration/drainage but did not get to make that appt. In HD the breast abscess opened and drained spontaneously. Because of continued drainage she [facility]. Pt admited on 01/15/21 and found to be COVID+ taken to OR for further exploration, culture, and excisional debridement 01/15/21. Medical history significant of ESRD-HD M-W-F, blindness, HTN, PAD    PT Comments    Pt received in bed, tired and reporting that HD did not go well. Pt steady for ambulation, but needed mod A for stairs. Increased pain in bilateral knees R>L. Pt stated she was afraid of completing more stairs in case her knees collapsed. Pt demonstrated poor eccentric control descending stairs and needed more assistance descending. Education provided on appropriate stair sequencing to help reduce pain on R side. Pt would benefit from PT to address strength, endurance, and balance in order to complete stairs without assistance/pain, decrease fall risk, and increase independency with community ambulation--especially since her strength and balance vary following HD. Left sitting on EOB with all needs met and call bell within reach. Will continue to follow acutely.    Follow Up Recommendations  Supervision for mobility/OOB;SNF     Equipment Recommendations  None recommended by PT    Recommendations for Other Services       Precautions / Restrictions Precautions Precautions: Other (comment) Precaution Comments: pt is blind, needs assist for navigation, L breast abscess Restrictions Weight Bearing Restrictions: No    Mobility  Bed Mobility Overal bed mobility: Modified Independent Bed Mobility: Supine to Sit                 Transfers Overall transfer level: Modified independent   Transfers: Sit to/from Stand Sit to Stand: Modified independent (Device/Increase time)            Ambulation/Gait Ambulation/Gait assistance: Min guard Gait Distance (Feet): 100 Feet Assistive device: None Gait Pattern/deviations: Step-through pattern;Decreased step length - right;Decreased step length - left;Drifts right/left Gait velocity: decreased   General Gait Details: Cues for navigation   Stairs Stairs: Yes Stairs assistance: Mod assist Stair Management: One rail Right;Forwards;Step to pattern Number of Stairs: 4 General stair comments: 1 LOB to R trying to ascend with R LE leading, more steady ascending with L LE but requiring mod A for steadying, especially when descending   Wheelchair Mobility    Modified Rankin (Stroke Patients Only)       Balance Overall balance assessment: Needs assistance Sitting-balance support: No upper extremity supported;Feet supported Sitting balance-Leahy Scale: Normal     Standing balance support: During functional activity;No upper extremity supported Standing balance-Leahy Scale: Good Standing balance comment: Steady in static standing and foward walking, reliant on external support for stairs                            Cognition Arousal/Alertness: Awake/alert Behavior During Therapy: WFL for tasks assessed/performed Overall Cognitive Status: Within Functional Limits for tasks assessed                                        Exercises  General Comments General comments (skin integrity, edema, etc.): Pt reported minimal R knee pain following stairs during stand to sit transfer to EOB      Pertinent Vitals/Pain Pain Assessment: Faces Faces Pain Scale: Hurts little more Pain Location: generalized discomfort from HD, bilateral knees with stairs Pain Intervention(s): Monitored during session    Home Living                       Prior Function            PT Goals (current goals can now be found in the care plan section) Acute Rehab PT Goals Patient Stated Goal: have less pain PT Goal Formulation: With patient Potential to Achieve Goals: Good Additional Goals Additional Goal #1: Pt will be able to navigate her new home environment with supervision Additional Goal #2: Will score at least 18 on DGI    Frequency    Min 2X/week      PT Plan Discharge plan needs to be updated    Co-evaluation              AM-PAC PT "6 Clicks" Mobility   Outcome Measure  Help needed turning from your back to your side while in a flat bed without using bedrails?: None Help needed moving from lying on your back to sitting on the side of a flat bed without using bedrails?: None Help needed moving to and from a bed to a chair (including a wheelchair)?: None Help needed standing up from a chair using your arms (e.g., wheelchair or bedside chair)?: None Help needed to walk in hospital room?: A Little Help needed climbing 3-5 steps with a railing? : A Lot 6 Click Score: 21    End of Session Equipment Utilized During Treatment: Gait belt Activity Tolerance: Patient tolerated treatment well Patient left: with call bell/phone within reach;in bed (sitting at EOB per her request) Nurse Communication: Mobility status PT Visit Diagnosis: Other abnormalities of gait and mobility (R26.89);Pain Pain - part of body:  (L breast and UE)     Time:  -     Charges:                        Rosita Kea, SPT

## 2021-03-09 NOTE — Consult Note (Signed)
   [facility] CM Inpatient Consult   03/09/2021  Rebekah Burton 03-Apr-1976 [id number]  Late entry 8:06 am Follow up chat received from inpatient [facility] RNCM, Sharyn Lull. This Probation officer also spoke with [facility] regarding patient barriers and needs.  Call and spoke with [facility] RNCM and with Shade Flood LCSW 9:30 am regarding patient needs for post hospital care for a safe disposition and support needs. Reviewed with options for the patient currently for post hospital care/support. Patient has transportation and food resources, however she needs safe shelter/accommadations.    Plan: Continue to follow with inpatient TOC for follow up needs as appropriate.  If patient goes to SNF her TOC needs will be at the skilled nursing facility level of care  For questions,  please contact:   Natividad Brood, RN BSN [facility] Liaison  [phone] business mobile phone Toll free office [phone]  Fax number: [phone] [email or web address]

## 2021-03-09 NOTE — Plan of Care (Signed)
  Problem: Pain Managment: Goal: General experience of comfort will improve Outcome: Progressing   

## 2021-03-10 DIAGNOSIS — N611 Abscess of the breast and nipple: Secondary | ICD-10-CM | POA: Diagnosis not present

## 2021-03-10 LAB — CBC
HCT: 33.2 % — ABNORMAL LOW (ref 36.0–46.0)
Hemoglobin: 10.5 g/dL — ABNORMAL LOW (ref 12.0–15.0)
MCH: 31.4 pg (ref 26.0–34.0)
MCHC: 31.6 g/dL (ref 30.0–36.0)
MCV: 99.4 fL (ref 80.0–100.0)
Platelets: 185 10*3/uL (ref 150–400)
RBC: 3.34 MIL/uL — ABNORMAL LOW (ref 3.87–5.11)
RDW: 17.8 % — ABNORMAL HIGH (ref 11.5–15.5)
WBC: 5.9 10*3/uL (ref 4.0–10.5)
nRBC: 0 % (ref 0.0–0.2)

## 2021-03-10 LAB — SARS CORONAVIRUS 2 (TAT 6-24 HRS): SARS Coronavirus 2: NEGATIVE

## 2021-03-10 LAB — RENAL FUNCTION PANEL
Albumin: 3 g/dL — ABNORMAL LOW (ref 3.5–5.0)
Anion gap: 12 (ref 5–15)
BUN: 32 mg/dL — ABNORMAL HIGH (ref 6–20)
CO2: 23 mmol/L (ref 22–32)
Calcium: 10 mg/dL (ref 8.9–10.3)
Chloride: 98 mmol/L (ref 98–111)
Creatinine, Ser: 11.07 mg/dL — ABNORMAL HIGH (ref 0.44–1.00)
GFR, Estimated: 4 mL/min — ABNORMAL LOW (ref >60)
Glucose, Bld: 157 mg/dL — ABNORMAL HIGH (ref 70–99)
Phosphorus: 5.8 mg/dL — ABNORMAL HIGH (ref 2.5–4.6)
Potassium: 4 mmol/L (ref 3.5–5.1)
Sodium: 133 mmol/L — ABNORMAL LOW (ref 135–145)

## 2021-03-10 MED ORDER — MORPHINE SULFATE (PF) 2 MG/ML IV SOLN
INTRAVENOUS | Status: AC
  Administered 2021-03-10: 2 mg via INTRAVENOUS
  Filled 2021-03-10: qty 1

## 2021-03-10 MED ORDER — HEPARIN SODIUM (PORCINE) 1000 UNIT/ML IJ SOLN
INTRAMUSCULAR | Status: AC
  Administered 2021-03-10: 7000 [IU]
  Filled 2021-03-10: qty 7

## 2021-03-10 NOTE — Progress Notes (Addendum)
[facility] Progress Note   Subjective: Seen prior to HD. No C/Os.    Objective Vitals:   03/10/21 0432 03/10/21 0900 03/10/21 0904 03/10/21 0930  BP: 120/72 107/67 (!) 99/59 112/60  Pulse: 80 73 74 70  Resp: 18 16    Temp: 98.5 F (36.9 C) 98.1 F (36.7 C)    TempSrc: Oral Oral    SpO2: 99% 99%    Weight:  103.5 kg    Height:       Physical Exam General:obese female in NAD Heart:S1,S2, RRRno M/R/B Lungs:CTABA/P Abdomen:Active BS Extremities:No LE edema. Dialysis Access:L AVF + bruit   Additional Objective Labs: Basic Metabolic Panel: No results for input(s): NA, K, CL, CO2, GLUCOSE, BUN, CREATININE, CALCIUM, PHOS in the last 168 hours.  Invalid input(s): ALB Liver Function Tests: No results for input(s): AST, ALT, ALKPHOS, BILITOT, PROT, ALBUMIN in the last 168 hours. No results for input(s): LIPASE, AMYLASE in the last 168 hours. CBC: Recent Labs  Lab 03/10/21 0115  WBC 5.9  HGB 10.5*  HCT 33.2*  MCV 99.4  PLT 185   Blood Culture    Component Value Date/Time   SDES BREAST 02/25/2021 1242   SPECREQUEST LEFT 02/25/2021 1242   CULT  02/25/2021 1242    No growth aerobically or anaerobically. Performed at [facility], [place] [address], [place], [place] [address]    REPTSTATUS 03/02/2021 FINAL 02/25/2021 1242    Cardiac Enzymes: No results for input(s): CKTOTAL, CKMB, CKMBINDEX, TROPONINI in the last 168 hours. CBG: No results for input(s): GLUCAP in the last 168 hours. Iron Studies: No results for input(s): IRON, TIBC, TRANSFERRIN, FERRITIN in the last 72 hours. '@lablastinr3'$ @ Studies/Results: No results found. Medications: . sodium chloride    . sodium chloride 10 mL/hr at 02/27/21 0831   . (feeding supplement) PROSource Plus  30 mL Oral BID BM  . carvedilol  25 mg Oral BID WC  . Chlorhexidine Gluconate Cloth  6 each Topical Q0600  . cloNIDine  0.3 mg Oral TID  . darbepoetin (ARANESP) injection - DIALYSIS  150 mcg  Intravenous Q Mon-HD  . heparin  5,000 Units Subcutaneous Q8H  . heparin sodium (porcine)      . multivitamin  1 tablet Oral QHS  . pregabalin  75 mg Oral QHS  . senna  1 tablet Oral BID  . sevelamer carbonate  3,200 mg Oral TID WC     Dialysis Orders: [facility] 4h 64mn 2/2.5 bath 105kg AVF  Heparin 7000 units IV initial bolus Heparin 3000 units IV mid run.  parsabiv 15 mgIVthree times a week with HD mircera 150 mcgIVevery 2 weeks - last given 01/13/21 hectorol 3 mcgIVthree times a week  Assessment/Plan: 1.Left breast abscess -s/p I&D abscess 01/15/2021 and 02/25/21 pathology was negative for malignancy on 01/21"although some concern for possible inflammatory breast cancer"repeat biopsy done 03/03 - negative for malignancy.Completed antibiotic course. 2.HD vascular access: Recurrentcannulationissuesw/AVF.IR completed f'gram 01/25/21 showing significant pseudoaneurysm s/p thrombin injection and near complete thrombosis of pseudoaneurysm per IR.Cannulation issues now appear to have resolved, will continue to monitor. Was on heparin prior to admission but wasn't included in orders. Will resume at lower dose.  3.Covid 19 on admission. Now out of isolation window.  5. ESRD: HD MWF. Next HD 3/16.Update labs. 6. Secondary hyperparathyroidism - Phos sl elevated -continue Renvela binder. Corr Ca elevated Use 2.0 Ca bath with HD.  7. Anemia. --HGB at goal. Continue weekly ESA.  8. HTN/volume -BP controlled, edema resolved.  She isbelow EDW, will need to be lowered at d/c.Reset EDW to 98 kg.UF as tolerated.  9. Nutrition -Low Albumin. Renal diet with fld restriction. Add protein supps.  Disposition: Patient has now decided that SNF would be safer option. Appreciate MSW for assisting in placement. Now considering [facility]. She is hoping to leave hospital soon.   Adair Lemar H. Levone Otten NP-C 03/10/2021, 9:41 AM  [employer] [phone]

## 2021-03-10 NOTE — Plan of Care (Signed)
  Problem: Safety: Goal: Ability to remain free from injury will improve Outcome: Progressing   

## 2021-03-10 NOTE — Progress Notes (Signed)
[facility] PROGRESS NOTE  Rebekah Burton [id number] DOB: 1976-01-31 DOA: 01/15/2021 PCP: Trey Sailors, PA  Status: Remains inpatient appropriate because:Unsafe d/c plan and Inpatient level of care appropriate due to severity of illness   Dispo: The patient is from: Homeless              Anticipated d/c is to: SNF per patient request.  Unfortunately patient only receives $800 per month and disability funds and this is far too low to pay for an appropriate long-term stay apartment until she is able to locate more permanent housing.  She also has some needs that preclude her from immediately discharging home independently.  At this time patient agrees for SNF placement.              Patient currently is medically stable to d/c.    Difficult to place patient Yes   Level of care: Med-Surg  Code Status: Full Family Communication:  DVT prophylaxis: SQ Heparin Vaccination status: Unknown-Incidentally Covid positive during this admission (01/15/2021)  Foley catheter: No  HPI:  45 y.o. female with PMH significant for ESRD on HD MWF, blindness, hypertension, PAD. Patient presented to the ED on 01/15/2021 with breast abscess, underwent I&D by general surgery.  She was initially treated with IV antibiotics later completed a course with oral antibiotics.  She was also found to be incidental Covid positive. Now she is off contact precautions. 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 IR and vascular were consulted. aVF is now working.Dialysis per nephrology.   Subjective: Patient now extremely reluctant to discharge to [facility].  She is agreeable at this time to initial recommendation for placement in a skilled nursing facility.  Objective: Vitals:   03/10/21 1130 03/10/21 1200  BP: (!) 146/84 (!) 144/55  Pulse: 71 (!) 57  Resp:    Temp:    SpO2:      Intake/Output  Summary (Last 24 hours) at 03/10/2021 1216 Last data filed at 03/09/2021 2200 Gross per 24 hour  Intake 777 ml  Output 0 ml  Net 777 ml   Filed Weights   03/08/21 1700 03/08/21 2025 03/10/21 0900  Weight: 102.8 kg 100.9 kg 103.5 kg    Exam:  Constitutional: Again reiterates reluctance to discharge to [facility]  Otherwise in no acute distress and calm. Breast: Left breast mains tender at the nipple region but soft Respiratory: Lungs are clear to auscultation and she remained stable on room air Cardiac: Heart sounds S1-S2, no JVD, extremities warm to touch Abdomen: Bowel sounds are present.  Tolerating diet.  LBM 3/14 Neurologic: CN 2-12 grossly intact except for known blindness/visual impairment. Sensation intact, DTR normal. Strength 5/5 x all 4 extremities.  Ext: Prior bilateral transmetatarsal amputations Psychiatric: Alert and oriented x3.  Pleasant mood   Assessment/Plan: Acute problems: Breast abscess/recurrent -s/p incision drainage done by general surgery.-Pathology without any evidence of malignancy -She was started on Unasyn, cultures grew gram-positive cocci. She was transitioned to Augmentin and completed recommended duration of therapy -On 3/1 reemergent breast abscess.  General surgery consulted and patient was resumed on Augmentin -3/2 US guided aspiration of complex fluid collection yielded minimal fluid returns -3/3 underwent intraoperative surgical debridement as well as biopsy to rule out underlying malignancy-pathology negative for malignancy and cultures negative for growth; on 3/8 surgery cleared from a surgical standpoint for discharge. -Continue OxyIR 15 mg every 4 hours as needed plus Tylenol  325 mg every 4 hours as needed - continue IV morphine prn for breakthrough pain -3/10 added Lyrica 75 mg daily as adjunct for pain management significant improvement in pain.  Of note her primary insurance requires preauthorization for Lyrica but Medicaid will pay for  this medication at $4 per prescription -Continue daily wound care as outlined by surgical team.  Discussed with home health.  Patient not eligible for home health services but she will also be presenting to a dialysis clinic 3 days a week for staff again evaluate her wound and contact surgical team if necessary if any concerns.  Incidental COVID-19 -patient is asymptomatic, no evidence of pulmonary involvement.  -Off isolation.Currently on room air.  ESRD -nephrology was consulted, had issues with cannulation of aVF. IR and vascular was consulted and underwent fistulogram demonstrated pseudoaneurysm.  -AVF is now working.  -Dialysis per nephrology.  -Continue sevelamer.  Anemia of chronic disease -monitor intermittently, hemoglobin is stable at 9.1. Continue Aranesp.   Other problems: Hyponatremia -patient on hemodialysis, nephrology following. Last Labs         Recent Labs  Lab 02/16/21 1204 02/17/21 1312 02/19/21 0837 02/22/21 0852  NA 132* 134* 135 133*     Hypertension -Blood pressure controlled on clonidine and Coreg.  Peripheral artery disease -hx of Fem-pop bypass. -Has history of left transmetatarsal amputation.    Data Reviewed: Basic Metabolic Panel: Recent Labs  Lab 03/10/21 0924  NA 133*  K 4.0  CL 98  CO2 23  GLUCOSE 157*  BUN 32*  CREATININE 11.07*  CALCIUM 10.0  PHOS 5.8*   Liver Function Tests: Recent Labs  Lab 03/10/21 0924  ALBUMIN 3.0*   No results for input(s): LIPASE, AMYLASE in the last 168 hours. No results for input(s): AMMONIA in the last 168 hours. CBC: Recent Labs  Lab 03/10/21 0115  WBC 5.9  HGB 10.5*  HCT 33.2*  MCV 99.4  PLT 185   Cardiac Enzymes: No results for input(s): CKTOTAL, CKMB, CKMBINDEX, TROPONINI in the last 168 hours. BNP (last 3 results) Recent Labs    01/18/21 0136 01/19/21 0030 01/20/21 0020  BNP 1,410.7* 1,122.4* 1,383.7*    ProBNP (last 3 results) No results for input(s):  PROBNP in the last 8760 hours.  CBG: No results for input(s): GLUCAP in the last 168 hours.  No results found for this or any previous visit (from the past 240 hour(s)).   Studies: No results found.  Scheduled Meds: . (feeding supplement) PROSource Plus  30 mL Oral BID BM  . carvedilol  25 mg Oral BID WC  . Chlorhexidine Gluconate Cloth  6 each Topical Q0600  . cloNIDine  0.3 mg Oral TID  . darbepoetin (ARANESP) injection - DIALYSIS  150 mcg Intravenous Q Mon-HD  . heparin  5,000 Units Subcutaneous Q8H  . multivitamin  1 tablet Oral QHS  . pregabalin  75 mg Oral QHS  . senna  1 tablet Oral BID  . sevelamer carbonate  3,200 mg Oral TID WC   Continuous Infusions: . sodium chloride    . sodium chloride 10 mL/hr at 02/27/21 [id number]    Active Problems:   ESRD (end stage renal disease) ([place])   Breast abscess   COVID-19 virus infection   Breast abscess of female   Left breast abscess   Poor social situation   Consultants:  Nephrology  General surgery  Interventional radiology  Procedures:  Aspiration of breast abscess per IR 3/1 with further image guided aspiration of complex breast  abscess planned for 3/2  Antibiotics: Anti-infectives (From admission, onward)   Start     Dose/Rate Route Frequency Ordered Stop   02/27/21 1730  clindamycin (CLEOCIN) capsule 300 mg        300 mg Oral Every 8 hours 02/27/21 1631 03/03/21 0511   02/24/21 2200  amoxicillin-clavulanate (AUGMENTIN) 500-125 MG per tablet 500 mg  Status:  Discontinued        1 tablet Oral Daily at bedtime 02/23/21 1149 02/24/21 0817   02/24/21 1200  clindamycin (CLEOCIN) capsule 300 mg  Status:  Discontinued        300 mg Oral Every 6 hours 02/24/21 0817 02/24/21 0952   02/24/21 1200  clindamycin (CLEOCIN) IVPB 600 mg  Status:  Discontinued        600 mg 100 mL/hr over 30 Minutes Intravenous Every 6 hours 02/24/21 0952 02/27/21 1631   02/23/21 1245  amoxicillin-clavulanate (AUGMENTIN) 500-125 MG per tablet  500 mg        1 tablet Oral  Once 02/23/21 1149 02/23/21 1445   02/23/21 1215  amoxicillin-clavulanate (AUGMENTIN) 875-125 MG per tablet 1 tablet  Status:  Discontinued        1 tablet Oral Every 12 hours 02/23/21 1120 02/23/21 1149   01/23/21 1400  amoxicillin-clavulanate (AUGMENTIN) 500-125 MG per tablet 500 mg        500 mg Oral Every 24 hours 01/23/21 1124 01/25/21 1657   01/23/21 1015  amoxicillin-clavulanate (AUGMENTIN) 875-125 MG per tablet 1 tablet  Status:  Discontinued        1 tablet Oral Every 12 hours 01/23/21 1002 01/23/21 1124   01/20/21 0845  vancomycin (VANCOCIN) 1-5 GM/200ML-% IVPB       Note to Pharmacy: Judieth Keens  : cabinet override      01/20/21 0845 01/20/21 2059   01/18/21 1200  vancomycin (VANCOCIN) IVPB 1000 mg/200 mL premix  Status:  Discontinued        1,000 mg 200 mL/hr over 60 Minutes Intravenous Every M-W-F (Hemodialysis) 01/16/21 1017 01/21/21 1324   01/17/21 2200  Ampicillin-Sulbactam (UNASYN) 3 g in sodium chloride 0.9 % 100 mL IVPB  Status:  Discontinued        3 g 200 mL/hr over 30 Minutes Intravenous Daily at bedtime 01/16/21 1058 01/23/21 1002   01/17/21 1000  remdesivir 100 mg in sodium chloride 0.9 % 100 mL IVPB       "Followed by" Linked Group Details   100 mg 200 mL/hr over 30 Minutes Intravenous Daily 01/15/21 2357 01/18/21 0918   01/16/21 1145  Ampicillin-Sulbactam (UNASYN) 3 g in sodium chloride 0.9 % 100 mL IVPB        3 g 200 mL/hr over 30 Minutes Intravenous  Once 01/16/21 1058 01/16/21 1417   01/16/21 1100  vancomycin (VANCOREADY) IVPB 1500 mg/300 mL        1,500 mg 150 mL/hr over 120 Minutes Intravenous  Once 01/16/21 1017 01/16/21 1849   01/16/21 0300  remdesivir 200 mg in sodium chloride 0.9% 250 mL IVPB       "Followed by" Linked Group Details   200 mg 580 mL/hr over 30 Minutes Intravenous Once 01/15/21 2357 01/16/21 0543   01/15/21 2345  amoxicillin-clavulanate (AUGMENTIN) 875-125 MG per tablet 1 tablet  Status:   Discontinued        1 tablet Oral Every 12 hours 01/15/21 2334 01/15/21 2357   01/15/21 1800  vancomycin (VANCOCIN) IVPB 1000 mg/200 mL premix  1,000 mg 200 mL/hr over 60 Minutes Intravenous  Once 01/15/21 1753 01/15/21 2120       Time spent: 20 minutes    Erin Hearing ANP  [facility] 7 am - 330 pm/M-F for direct patient care and secure chat Please refer to Amion for contact info 54  days

## 2021-03-10 NOTE — Progress Notes (Addendum)
CSW spoke with patient to present her with bed offers from [facility] and [facility] - patient chooses [facility]. Patient confirmed she has not received her COVID vaccine - she will need a COVID test prior to discharge. CSW notified Dr. Candiss Norse of request.  CSW notified Loma Boston at [facility] of acceptance. CSW to facilitate discharge as soon as a bed is available.  CSW informed [facility] of patient's change of address.  Patient's Medicaid worker is Cherene Altes [phone]) at [place].  Madilyn Fireman, MSW, LCSW Transitions of Care  Clinical Social Worker II [phone]

## 2021-03-10 NOTE — Progress Notes (Signed)
[facility] PROGRESS NOTE  Rebekah Burton [id number] DOB: 12-31-1975 DOA: 01/15/2021 PCP: Trey Sailors, PA   HPI:    45 y.o. female with PMH significant for ESRD on HD MWF, blindness, hypertension, PAD. Patient presented to the ED on 01/15/2021 with breast abscess, underwent I&D by general surgery.  She was initially treated with IV antibiotics later completed a course with oral antibiotics.  She was also found to be incidental Covid positive.  Hospital course complicated by difficulty with cannulation of aVF, vascular surgery was consulted, underwent fistulogram and was found to have pseudoaneurysm. IR and vascular were consulted. aVF is now working.Dialysis per nephrology.  Further hospital course complicated by recurrence of breast abscess-s/p ultrasound guided drainage on 3/2-and then incision and drainage by general surgery on 3/3.  See below for further details   Subjective:  Patient in bed, appears comfortable, denies any headache, no fever, no chest pain or pressure, no shortness of breath , no abdominal pain. No focal weakness. No breast pain.   Objective: Vitals:   03/10/21 0930 03/10/21 1000  BP: 112/60 119/65  Pulse: 70 70  Resp:    Temp:    SpO2:      Intake/Output Summary (Last 24 hours) at 03/10/2021 1029 Last data filed at 03/09/2021 2200 Gross per 24 hour  Intake 777 ml  Output 0 ml  Net 777 ml   Filed Weights   03/08/21 1700 03/08/21 2025 03/10/21 0900  Weight: 102.8 kg 100.9 kg 103.5 kg    Exam:  Awake Alert, No new F.N deficits, Normal affect [place].AT,PERRAL Supple Neck,No JVD, No cervical lymphadenopathy appriciated.  Symmetrical Chest wall movement, Good air movement bilaterally, CTAB RRR,No Gallops, Rubs or new Murmurs, No Parasternal Heave +ve B.Sounds, Abd Soft, No tenderness, No organomegaly appriciated, No rebound - guarding or rigidity. No Cyanosis, bilateral transmetatarsal amputations   Assessment/Plan:  Breast  abscess/recurrent: Afebrile-no leukocytosis- no longer on antibiotics, general surgery continues to follow. All cultures negative to date,  biopsy negative for malignancy patient is s/p I&D x 2 this admission last on 3/3.  Incidental COVID-19: Off isolation-asymptomatic  ESRD: HD per nephrology.MWF scheudule.  HTN: Controlled-continue clonidine/Coreg  Hyponatremia: Mild-being managed with volume overload with dialysis.  PAD-s/p femoropopliteal bypass-s/p bilateraL TMA  Legally Blind - supportive Rx.  Anemia of chronic disease: Monitor periodically.     Status:  Remains inpatient appropriate because:Unsafe d/c plan and Inpatient level of care appropriate due to severity of illness   Dispo: The patient is from: Homeless              Anticipated d/c is to: ALF vs SNF vs to be determined on 3/1 patient told [facility] staff that she did not wish to pursue skilled nursing facility and instead reported desire to be set up in a hotel.  Medicaid check not available until 3/11.  If medically stable before that date we will ask [facility] supervisor to assist with payment for motel room.              Patient currently is not medically stable to d/c.  Patient has had reemergence of left breast abscess and will be requiring close follow-up as well as oral antibiotics.  Will be undergoing an ultrasound-guided aspiration of the abscess in the left breast on 3/2 and if unsuccessful may require intraoperative debridement.   Difficult to place patient Yes   Level of care: Med-Surg  Code Status: Full Family Communication:  DVT prophylaxis: SQ Heparin Vaccination status: Unknown-Incidentally Covid positive during  this admission (01/15/2021), repeat test ordered 03/10/21 for SNF  Foley catheter: No  Data Reviewed: Basic Metabolic Panel: Recent Labs  Lab 03/10/21 0924  NA 133*  K 4.0  CL 98  CO2 23  GLUCOSE 157*  BUN 32*  CREATININE 11.07*  CALCIUM 10.0  PHOS 5.8*   Liver Function Tests: Recent  Labs  Lab 03/10/21 0924  ALBUMIN 3.0*   No results for input(s): LIPASE, AMYLASE in the last 168 hours. No results for input(s): AMMONIA in the last 168 hours. CBC: Recent Labs  Lab 03/10/21 0115  WBC 5.9  HGB 10.5*  HCT 33.2*  MCV 99.4  PLT 185   Cardiac Enzymes: No results for input(s): CKTOTAL, CKMB, CKMBINDEX, TROPONINI in the last 168 hours. BNP (last 3 results) Recent Labs    01/18/21 0136 01/19/21 0030 01/20/21 0020  BNP 1,410.7* 1,122.4* 1,383.7*    ProBNP (last 3 results) No results for input(s): PROBNP in the last 8760 hours.  CBG: No results for input(s): GLUCAP in the last 168 hours.  No results found for this or any previous visit (from the past 240 hour(s)).   Studies: No results found.  Scheduled Meds: . (feeding supplement) PROSource Plus  30 mL Oral BID BM  . carvedilol  25 mg Oral BID WC  . Chlorhexidine Gluconate Cloth  6 each Topical Q0600  . cloNIDine  0.3 mg Oral TID  . darbepoetin (ARANESP) injection - DIALYSIS  150 mcg Intravenous Q Mon-HD  . heparin  5,000 Units Subcutaneous Q8H  . heparin sodium (porcine)      . multivitamin  1 tablet Oral QHS  . pregabalin  75 mg Oral QHS  . senna  1 tablet Oral BID  . sevelamer carbonate  3,200 mg Oral TID WC   Continuous Infusions: . sodium chloride    . sodium chloride 10 mL/hr at 02/27/21 [id number]    Active Problems:   ESRD (end stage renal disease) ([place])   Breast abscess   COVID-19 virus infection   Breast abscess of female   Left breast abscess   Poor social situation   Consultants:  Nephrology  General surgery  Interventional radiology  Procedures:  Aspiration of breast abscess per IR 3/1 with further image guided aspiration of complex breast abscess planned for 3/2    Time spent: 15 minutes  54  days  Signature  Lala Lund M.D on 03/10/2021 at 10:29 AM   -  To page go to [email or web address]

## 2021-03-11 DIAGNOSIS — N611 Abscess of the breast and nipple: Secondary | ICD-10-CM | POA: Diagnosis not present

## 2021-03-11 LAB — CBC
HCT: 33.7 % — ABNORMAL LOW (ref 36.0–46.0)
Hemoglobin: 10.3 g/dL — ABNORMAL LOW (ref 12.0–15.0)
MCH: 30.7 pg (ref 26.0–34.0)
MCHC: 30.6 g/dL (ref 30.0–36.0)
MCV: 100.3 fL — ABNORMAL HIGH (ref 80.0–100.0)
Platelets: 183 10*3/uL (ref 150–400)
RBC: 3.36 MIL/uL — ABNORMAL LOW (ref 3.87–5.11)
RDW: 18 % — ABNORMAL HIGH (ref 11.5–15.5)
WBC: 5.1 10*3/uL (ref 4.0–10.5)
nRBC: 0 % (ref 0.0–0.2)

## 2021-03-11 LAB — RENAL FUNCTION PANEL
Albumin: 3.1 g/dL — ABNORMAL LOW (ref 3.5–5.0)
Anion gap: 9 (ref 5–15)
BUN: 20 mg/dL (ref 6–20)
CO2: 30 mmol/L (ref 22–32)
Calcium: 10.2 mg/dL (ref 8.9–10.3)
Chloride: 95 mmol/L — ABNORMAL LOW (ref 98–111)
Creatinine, Ser: 8.34 mg/dL — ABNORMAL HIGH (ref 0.44–1.00)
GFR, Estimated: 6 mL/min — ABNORMAL LOW (ref >60)
Glucose, Bld: 128 mg/dL — ABNORMAL HIGH (ref 70–99)
Phosphorus: 4.5 mg/dL (ref 2.5–4.6)
Potassium: 3.8 mmol/L (ref 3.5–5.1)
Sodium: 134 mmol/L — ABNORMAL LOW (ref 135–145)

## 2021-03-11 MED ORDER — HEPARIN SODIUM (PORCINE) 1000 UNIT/ML DIALYSIS: 7000.0000 [IU]/h | INTRAMUSCULAR | Status: DC | PRN

## 2021-03-11 MED ORDER — SODIUM CHLORIDE 0.9 % IV SOLN: 100.0000 mL | INTRAVENOUS | Status: DC | PRN

## 2021-03-11 MED ORDER — TRAMADOL HCL 50 MG PO TABS: 50.0000 mg | ORAL_TABLET | Freq: Four times a day (QID) | ORAL | 0 refills | Status: DC | PRN

## 2021-03-11 MED ORDER — PENTAFLUOROPROP-TETRAFLUOROETH EX AERO: 1.0000 "application " | INHALATION_SPRAY | CUTANEOUS | Status: DC | PRN

## 2021-03-11 MED ORDER — OXYCODONE HCL 15 MG PO TABS: 15.0000 mg | ORAL_TABLET | ORAL | 0 refills | Status: DC | PRN

## 2021-03-11 MED ORDER — HEPARIN SODIUM (PORCINE) 1000 UNIT/ML DIALYSIS: 1000.0000 [IU] | INTRAMUSCULAR | Status: DC | PRN

## 2021-03-11 MED ORDER — LIDOCAINE HCL (PF) 1 % IJ SOLN: 5.0000 mL | INTRAMUSCULAR | Status: DC | PRN

## 2021-03-11 MED ORDER — HEPARIN SODIUM (PORCINE) 1000 UNIT/ML DIALYSIS
5000.0000 [IU] | Freq: Once | INTRAMUSCULAR | Status: DC
  Filled 2021-03-11: qty 5

## 2021-03-11 MED ORDER — LIDOCAINE-PRILOCAINE 2.5-2.5 % EX CREA: 1.0000 "application " | TOPICAL_CREAM | CUTANEOUS | Status: DC | PRN

## 2021-03-11 MED ORDER — ALTEPLASE 2 MG IJ SOLR: 2.0000 mg | Freq: Once | INTRAMUSCULAR | Status: DC | PRN

## 2021-03-11 MED ORDER — SODIUM CHLORIDE 0.9 % IV SOLN
100.0000 mL | INTRAVENOUS | Status: DC | PRN
Start: 2021-03-12 — End: 2021-03-12

## 2021-03-11 NOTE — Progress Notes (Addendum)
2:40pm: CSW was notified by[place] that patient's SNF request was denied. A peer to peer can be completed by Dr. Candiss Norse - CSW provided him with details to complete call.   10:15am: CSW left voicemail for [employer] at [place] to discuss patient's need for long term Medicaid.  9:15am: CSW faxed clinicals to [place].  7:45am: CSW received notification from [place] at [facility] stating [employer] needed additional information for [employer] authorization.  CSW attempted to reach [place], RN CM with [employer] at [phone] without success - a voicemail was left requesting a return call.  Madilyn Fireman, MSW, LCSW Transitions of Care  Clinical Social Worker II [phone]

## 2021-03-11 NOTE — Progress Notes (Signed)
[facility] PROGRESS NOTE  Rebekah Burton [id number] DOB: 15-May-1976 DOA: 01/15/2021 PCP: Trey Sailors, PA   HPI:    45 y.o. female with PMH significant for ESRD on HD MWF, blindness, hypertension, PAD. Patient presented to the ED on 01/15/2021 with breast abscess, underwent I&D by general surgery.  She was initially treated with IV antibiotics later completed a course with oral antibiotics.  She was also found to be incidental Covid positive.  Hospital course complicated by difficulty with cannulation of aVF, vascular surgery was consulted, underwent fistulogram and was found to have pseudoaneurysm. IR and vascular were consulted. aVF is now working.Dialysis per nephrology.  Further hospital course complicated by recurrence of breast abscess-s/p ultrasound guided drainage on 3/2-and then incision and drainage by general surgery on 3/3.  See below for further details   Subjective: Patient in bed, appears comfortable, denies any headache, no fever, no chest pain or pressure, no shortness of breath , no abdominal pain. No focal weakness.   Objective: Vitals:   03/10/21 2130 03/11/21 0459  BP: 111/62 (!) 137/92  Pulse: 72 81  Resp: 18 16  Temp: 98.9 F (37.2 C) 98.9 F (37.2 C)  SpO2: 95% 100%    Intake/Output Summary (Last 24 hours) at 03/11/2021 0905 Last data filed at 03/10/2021 1900 Gross per 24 hour  Intake 600 ml  Output 3000 ml  Net -2400 ml   Filed Weights   03/08/21 2025 03/10/21 0900 03/10/21 1240  Weight: 100.9 kg 103.5 kg 99.9 kg    Exam:  Awake Alert, No new F.N deficits, Normal affect [place].AT,PERRAL Supple Neck,No JVD, No cervical lymphadenopathy appriciated.  Symmetrical Chest wall movement, Good air movement bilaterally, CTAB RRR,No Gallops, Rubs or new Murmurs, No Parasternal Heave +ve B.Sounds, Abd Soft, No tenderness, No organomegaly appriciated, No rebound - guarding or rigidity. No Cyanosis,  bilateral transmetatarsal  amputations   Assessment/Plan:  Breast abscess/recurrent: Afebrile-no leukocytosis- no longer on antibiotics, general surgery continues to follow. All cultures negative to date,  biopsy negative for malignancy patient is s/p I&D x 2 this admission last on 3/3.  Both of which healed, only dry dressing at this time, post discharge follow-up with general surgery in 1 to 2 weeks.  May be canceledNeeds outpt Mammogram in 1 mth.  Incidental COVID-19: Off isolation-asymptomatic  ESRD: HD per nephrology.MWF scheudule.  HTN: Controlled-continue clonidine/Coreg  Hyponatremia: Mild-being managed with volume overload with dialysis.  PAD-s/p femoropopliteal bypass-s/p bilateraL TMA   Legally Blind - supportive Rx.  Anemia of chronic disease: Monitor periodically.     Status:  Remains inpatient appropriate because:Unsafe d/c plan and Inpatient level of care appropriate due to severity of illness   Dispo: The patient is from: Homeless              Anticipated d/c is to: ALF vs SNF vs to be determined on 3/1 patient told [facility] staff that she did not wish to pursue skilled nursing facility and instead reported desire to be set up in a hotel.  Medicaid check not available until 3/11.  If medically stable before that date we will ask [facility] supervisor to assist with payment for motel room.              Patient currently is not medically stable to d/c.  Patient has had reemergence of left breast abscess and will be requiring close follow-up as well as oral antibiotics.  Will be undergoing an ultrasound-guided aspiration of the abscess in the left breast on 3/2 and  if unsuccessful may require intraoperative debridement.   Difficult to place patient Yes   Level of care: Med-Surg  Code Status: Full Family Communication:  DVT prophylaxis: SQ Heparin Vaccination status: Unknown-Incidentally Covid positive during this admission (01/15/2021), repeat test ordered 03/10/21 for SNF  Foley catheter: No  Data  Reviewed: Basic Metabolic Panel: Recent Labs  Lab 03/10/21 0924  NA 133*  K 4.0  CL 98  CO2 23  GLUCOSE 157*  BUN 32*  CREATININE 11.07*  CALCIUM 10.0  PHOS 5.8*   Liver Function Tests: Recent Labs  Lab 03/10/21 0924  ALBUMIN 3.0*   No results for input(s): LIPASE, AMYLASE in the last 168 hours. No results for input(s): AMMONIA in the last 168 hours. CBC: Recent Labs  Lab 03/10/21 0115  WBC 5.9  HGB 10.5*  HCT 33.2*  MCV 99.4  PLT 185   Cardiac Enzymes: No results for input(s): CKTOTAL, CKMB, CKMBINDEX, TROPONINI in the last 168 hours. BNP (last 3 results) Recent Labs    01/18/21 0136 01/19/21 0030 01/20/21 0020  BNP 1,410.7* 1,122.4* 1,383.7*    ProBNP (last 3 results) No results for input(s): PROBNP in the last 8760 hours.  CBG: No results for input(s): GLUCAP in the last 168 hours.  Recent Results (from the past 240 hour(s))  SARS CORONAVIRUS 2 (TAT 6-24 HRS) Nasopharyngeal Nasopharyngeal Swab     Status: None   Collection Time: 03/10/21 10:29 AM   Specimen: Nasopharyngeal Swab  Result Value Ref Range Status   SARS Coronavirus 2 NEGATIVE NEGATIVE Final    Comment: (NOTE) SARS-CoV-2 target nucleic acids are NOT DETECTED.  The SARS-CoV-2 RNA is generally detectable in upper and lower respiratory specimens during the acute phase of infection. Negative results do not preclude SARS-CoV-2 infection, do not rule out co-infections with other pathogens, and should not be used as the sole basis for treatment or other patient management decisions. Negative results must be combined with clinical observations, patient history, and epidemiological information. The expected result is Negative.  Fact Sheet for Patients: SugarRoll.be  Fact Sheet for Healthcare Providers: https://www.woods-mathews.com/  This test is not yet approved or cleared by the [place] FDA and  has been authorized for detection and/or  diagnosis of SARS-CoV-2 by FDA under an Emergency Use Authorization (EUA). This EUA will remain  in effect (meaning this test can be used) for the duration of the COVID-19 declaration under Se ction 564(b)(1) of the Act, 21 U.S.C. section 360bbb-3(b)(1), unless the authorization is terminated or revoked sooner.  Performed at [facility], [place] [address], [place],  [address]      Studies: No results found.  Scheduled Meds: . (feeding supplement) PROSource Plus  30 mL Oral BID BM  . carvedilol  25 mg Oral BID WC  . Chlorhexidine Gluconate Cloth  6 each Topical Q0600  . cloNIDine  0.3 mg Oral TID  . darbepoetin (ARANESP) injection - DIALYSIS  150 mcg Intravenous Q Mon-HD  . heparin  5,000 Units Subcutaneous Q8H  . multivitamin  1 tablet Oral QHS  . pregabalin  75 mg Oral QHS  . senna  1 tablet Oral BID  . sevelamer carbonate  3,200 mg Oral TID WC   Continuous Infusions: . sodium chloride    . sodium chloride 10 mL/hr at 02/27/21 [id number]    Active Problems:   ESRD (end stage renal disease) ([place])   Breast abscess   COVID-19 virus infection   Breast abscess of female   Left breast abscess  Poor social situation   Consultants:  Nephrology  General surgery  Interventional radiology  Procedures:   Aspiration of breast abscess per IR 3/1 with further image guided aspiration of complex breast abscess planned for 3/2    Time spent: 15 minutes  55  days  Signature  Lala Lund M.D on 03/11/2021 at 9:05 AM   -  To page go to [email or web address]

## 2021-03-11 NOTE — Care Management Important Message (Signed)
Important Message  Patient Details  Name: Rebekah Burton MRN: [id number] Date of Birth: [birth year]   Medicare Important Message Given:  Yes     Avelino Herren P Jostin Rue 03/11/2021, 11:14 AM

## 2021-03-11 NOTE — Progress Notes (Signed)
Physical Therapy Treatment Patient Details Name: Rebekah Burton MRN: [id number] DOB: [birth year] Today's Date: 03/11/2021    History of Present Illness 45 y.o. female with developed a breast abscess and was seen in ED 01/04/21 where U/S confirmed abscess. She was to have been seen as outpatient for aspiration/drainage but did not get to make that appt. In HD the breast abscess opened and drained spontaneously. Because of continued drainage she presents to [facility] for evaluation. Pt admited on 01/15/21 and found to be COVID+ taken to OR for further exploration, culture, and excisional debridement 01/15/21. Medical history significant of ESRD-HD M-W-F, blindness, HTN, PAD    PT Comments    Pt received in bed, reporting she did not sleep well and is tired. Focused on evaluating endurance during 2MWT and strength/muscular endurance during 30 second sit to stand test. Pt was able to walk 210 feet in 2 minutes with cues for navigation. No LOB. Pt had more difficulty with sit to stand test, completing 7 repetitions from standard chair (18in) but reporting increased bilateral knee pain and feeling weak in her knees afterward. Pt would benefit from continued PT to address strength, endurance, decrease fall risk, and increase independency in community. Will continue to follow acutely.     Follow Up Recommendations  Supervision for mobility/OOB;SNF     Equipment Recommendations  None recommended by PT    Recommendations for Other Services       Precautions / Restrictions Precautions Precautions: Other (comment) Precaution Comments: pt is blind, needs assist for navigation, L breast abscess Restrictions Weight Bearing Restrictions: No    Mobility  Bed Mobility Overal bed mobility: Modified Independent Bed Mobility: Supine to Sit;Sit to Supine     Supine to sit: Modified independent (Device/Increase time) Sit to supine: Modified independent (Device/Increase time)        Transfers Overall  transfer level: Modified independent Equipment used: None Transfers: Sit to/from Stand Sit to Stand: Modified independent (Device/Increase time)            Ambulation/Gait   Gait Distance (Feet): 250 Feet (250 total, 210 during 2 MWT) Assistive device: None Gait Pattern/deviations: Step-through pattern;Decreased step length - right;Decreased step length - left;Drifts right/left     General Gait Details: Cues for navigation   Stairs             Wheelchair Mobility    Modified Rankin (Stroke Patients Only)       Balance Overall balance assessment: Needs assistance Sitting-balance support: No upper extremity supported;Feet supported Sitting balance-Leahy Scale: Normal     Standing balance support: During functional activity;No upper extremity supported Standing balance-Leahy Scale: Good Standing balance comment: Steady in static standing and foward walking               High Level Balance Comments: 2 Minute Walk Test = 210 feet, 30 second sit to stand test = x7 with increased pain            Cognition Arousal/Alertness: Awake/alert Behavior During Therapy: WFL for tasks assessed/performed Overall Cognitive Status: Within Functional Limits for tasks assessed                                 General Comments: cooperative      Exercises      General Comments        Pertinent Vitals/Pain Pain Assessment: Faces Faces Pain Scale: Hurts a little bit Pain Location: generalized discomfort Pain Descriptors /  Indicators: Discomfort;Aching Pain Intervention(s): Monitored during session    Home Living                      Prior Function            PT Goals (current goals can now be found in the care plan section)      Frequency    Min 2X/week      PT Plan Current plan remains appropriate    Co-evaluation              AM-PAC PT "6 Clicks" Mobility   Outcome Measure  Help needed turning from your back to  your side while in a flat bed without using bedrails?: None Help needed moving from lying on your back to sitting on the side of a flat bed without using bedrails?: None Help needed moving to and from a bed to a chair (including a wheelchair)?: None Help needed standing up from a chair using your arms (e.g., wheelchair or bedside chair)?: None Help needed to walk in hospital room?: A Little Help needed climbing 3-5 steps with a railing? : A Lot 6 Click Score: 21    End of Session Equipment Utilized During Treatment: Gait belt Activity Tolerance: Patient tolerated treatment well Patient left: with call bell/phone within reach;in bed (sitting at EOB per her request) Nurse Communication: Mobility status PT Visit Diagnosis: Other abnormalities of gait and mobility (R26.89);Pain Pain - part of body:  (L breast and UE)     Time:  -     Charges:                        Rosita Kea, SPT

## 2021-03-11 NOTE — Progress Notes (Signed)
[facility] Progress Note   Subjective: Tentative acceptance at [facility] pending insurance approval. No C/Os today. Hoping to leave hospital today.   Objective Vitals:   03/10/21 1734 03/10/21 1857 03/10/21 2130 03/11/21 0459  BP: 118/72 131/85 111/62 (!) 137/92  Pulse: 76 73 72 81  Resp: '18 16 18 16  '$ Temp: 98.3 F (36.8 C) 98 F (36.7 C) 98.9 F (37.2 C) 98.9 F (37.2 C)  TempSrc: Oral Oral Oral   SpO2: 99% 100% 95% 100%  Weight:      Height:       Physical Exam General:obese female in NAD Heart:S1,S2, RRRno M/R/B Lungs:CTABA/P Abdomen:Active BS Extremities:No LE edema. Dialysis Access:L AVF + bruit    Additional Objective Labs: Basic Metabolic Panel: Recent Labs  Lab 03/10/21 0924  NA 133*  K 4.0  CL 98  CO2 23  GLUCOSE 157*  BUN 32*  CREATININE 11.07*  CALCIUM 10.0  PHOS 5.8*   Liver Function Tests: Recent Labs  Lab 03/10/21 0924  ALBUMIN 3.0*   No results for input(s): LIPASE, AMYLASE in the last 168 hours. CBC: Recent Labs  Lab 03/10/21 0115  WBC 5.9  HGB 10.5*  HCT 33.2*  MCV 99.4  PLT 185   Blood Culture    Component Value Date/Time   SDES BREAST 02/25/2021 1242   SPECREQUEST LEFT 02/25/2021 1242   CULT  02/25/2021 1242    No growth aerobically or anaerobically. Performed at [facility], [place] [address] [place], [place] [address]    REPTSTATUS 03/02/2021 FINAL 02/25/2021 1242    Cardiac Enzymes: No results for input(s): CKTOTAL, CKMB, CKMBINDEX, TROPONINI in the last 168 hours. CBG: No results for input(s): GLUCAP in the last 168 hours. Iron Studies: No results for input(s): IRON, TIBC, TRANSFERRIN, FERRITIN in the last 72 hours. '@lablastinr3'$ @ Studies/Results: No results found. Medications: . sodium chloride    . sodium chloride 10 mL/hr at 02/27/21 0831   . (feeding supplement) PROSource Plus  30 mL Oral BID BM  . carvedilol  25 mg Oral BID WC  . Chlorhexidine Gluconate Cloth  6  each Topical Q0600  . cloNIDine  0.3 mg Oral TID  . darbepoetin (ARANESP) injection - DIALYSIS  150 mcg Intravenous Q Mon-HD  . heparin  5,000 Units Subcutaneous Q8H  . multivitamin  1 tablet Oral QHS  . pregabalin  75 mg Oral QHS  . senna  1 tablet Oral BID  . sevelamer carbonate  3,200 mg Oral TID WC     Dialysis Orders: [facility] 4h 77mn 2/2.5 bath 105kg AVF  Heparin 7000 units IV initial bolus Heparin 3000 units IV mid run.  parsabiv 15 mgIVthree times a week with HD mircera 150 mcgIVevery 2 weeks - last given 01/13/21 hectorol 3 mcgIVthree times a week  Assessment/Plan: 1.Left breast abscess -s/p I&D abscess 01/15/2021 and 02/25/21 pathology was negative for malignancy on 01/21"although some concern for possible inflammatory breast cancer"repeat biopsy done 03/03 - negative for malignancy.Completed antibiotic course. 2.HD vascular access: Recurrentcannulationissuesw/AVF.IR completed f'gram 01/25/21 showing significant pseudoaneurysm s/p thrombin injection and near complete thrombosis of pseudoaneurysm per IR.Cannulation issues now appear to have resolved, will continue to monitor. Was on heparin prior to admission but wasn't included in orders. Will resume at lower dose.  3.Covid 19 on admission. Now out of isolation window.  5. ESRD: HD MWF. [facility] does not leave hospital today for HD 1st shift 03/12/21.  6. Secondary hyperparathyroidism - Phos sl elevated -continue Renvela binder. Corr Ca elevated Use 2.0 Ca bath with HD.  7. Anemia. --HGB at goal. Continue weekly ESA.  8. HTN/volume -BP controlled, edema resolved. She isbelow EDW, will need to be lowered at d/c.Reset EDW to 98 kg.Reinforced fld restrictions-didn't get to EDW with HD 06/10/21. UF as tolerated.  9. Nutrition -Low Albumin. Renal diet with fld restriction. Add protein supps.  Disposition:Patient has now decided that  SNF would be safer option. Appreciate MSW for assisting in placement.Now considering [facility]. She is hoping to leave hospital soon.   Zanyiah Posten H. Lyah Millirons NP-C 03/11/2021, 10:45 AM  [employer] [phone]

## 2021-03-11 NOTE — Plan of Care (Signed)
  Problem: Pain Managment: Goal: General experience of comfort will improve Outcome: Progressing   Problem: Health Behavior/Discharge Planning: Goal: Ability to manage health-related needs will improve Outcome: Progressing

## 2021-03-12 DIAGNOSIS — N611 Abscess of the breast and nipple: Secondary | ICD-10-CM | POA: Diagnosis not present

## 2021-03-12 LAB — RENAL FUNCTION PANEL
Albumin: 3 g/dL — ABNORMAL LOW (ref 3.5–5.0)
Anion gap: 9 (ref 5–15)
BUN: 31 mg/dL — ABNORMAL HIGH (ref 6–20)
CO2: 23 mmol/L (ref 22–32)
Calcium: 10.2 mg/dL (ref 8.9–10.3)
Chloride: 96 mmol/L — ABNORMAL LOW (ref 98–111)
Creatinine, Ser: 10.08 mg/dL — ABNORMAL HIGH (ref 0.44–1.00)
GFR, Estimated: 5 mL/min — ABNORMAL LOW (ref >60)
Glucose, Bld: 135 mg/dL — ABNORMAL HIGH (ref 70–99)
Phosphorus: 5.2 mg/dL — ABNORMAL HIGH (ref 2.5–4.6)
Potassium: 4.5 mmol/L (ref 3.5–5.1)
Sodium: 128 mmol/L — ABNORMAL LOW (ref 135–145)

## 2021-03-12 LAB — CBC
HCT: 32.2 % — ABNORMAL LOW (ref 36.0–46.0)
Hemoglobin: 10.2 g/dL — ABNORMAL LOW (ref 12.0–15.0)
MCH: 31.7 pg (ref 26.0–34.0)
MCHC: 31.7 g/dL (ref 30.0–36.0)
MCV: 100 fL (ref 80.0–100.0)
Platelets: 198 10*3/uL (ref 150–400)
RBC: 3.22 MIL/uL — ABNORMAL LOW (ref 3.87–5.11)
RDW: 17.9 % — ABNORMAL HIGH (ref 11.5–15.5)
WBC: 5.4 10*3/uL (ref 4.0–10.5)
nRBC: 0 % (ref 0.0–0.2)

## 2021-03-12 MED ORDER — HEPARIN SODIUM (PORCINE) 1000 UNIT/ML IJ SOLN
INTRAMUSCULAR | Status: AC
  Administered 2021-03-12: 7000 [IU]/h via INTRAVENOUS_CENTRAL
  Filled 2021-03-12: qty 7

## 2021-03-12 MED ORDER — OXYCODONE HCL 5 MG PO TABS
10.0000 mg | ORAL_TABLET | ORAL | Status: DC | PRN
  Administered 2021-03-12 – 2021-03-16 (×19): 10 mg via ORAL
  Filled 2021-03-12 (×18): qty 2

## 2021-03-12 MED ORDER — ACETAMINOPHEN 325 MG PO TABS
325.0000 mg | ORAL_TABLET | ORAL | Status: DC | PRN
  Administered 2021-03-14 – 2021-03-15 (×2): 325 mg via ORAL
  Filled 2021-03-12 (×2): qty 1

## 2021-03-12 NOTE — Progress Notes (Addendum)
[facility] PROGRESS NOTE  Rebekah Burton [id number] DOB: 1976/02/16 DOA: 01/15/2021 PCP: Trey Sailors, PA   HPI:    45 y.o. female with PMH significant for ESRD on HD MWF, blindness, hypertension, PAD. Patient presented to the ED on 01/15/2021 with breast abscess, underwent I&D by general surgery.  She was initially treated with IV antibiotics later completed a course with oral antibiotics.  She was also found to be incidental Covid positive.  Hospital course complicated by difficulty with cannulation of aVF, vascular surgery was consulted, underwent fistulogram and was found to have pseudoaneurysm. IR and vascular were consulted. aVF is now working.Dialysis per nephrology.  Further hospital course complicated by recurrence of breast abscess-s/p ultrasound guided drainage on 3/2-and then incision and drainage by general surgery on 3/3.  See below for further details   Subjective: Patient in bed, appears comfortable, denies any headache, no fever, no chest pain or pressure, no shortness of breath , no abdominal pain. No focal weakness. Some L breast pain.    Objective: Vitals:   03/12/21 0845 03/12/21 0900  BP: (!) 86/52 (!) 86/45  Pulse: 73 71  Resp:    Temp:    SpO2:      Intake/Output Summary (Last 24 hours) at 03/12/2021 0919 Last data filed at 03/12/2021 0600 Gross per 24 hour  Intake 600 ml  Output 0 ml  Net 600 ml   Filed Weights   03/11/21 2100 03/11/21 2200 03/12/21 0715  Weight: 102 kg 104.8 kg 102.6 kg    Exam:  Awake Alert, No new F.N deficits, Normal affect [place].AT,PERRAL Supple Neck,No JVD, No cervical lymphadenopathy appriciated.  Symmetrical Chest wall movement, Good air movement bilaterally, CTAB RRR,No Gallops, Rubs or new Murmurs, No Parasternal Heave +ve B.Sounds, Abd Soft, No tenderness, No organomegaly appriciated, No rebound - guarding or rigidity. No Cyanosis, bilateral transmetatarsal amputations   Assessment/Plan:  Breast  abscess/recurrent: Afebrile-no leukocytosis- no longer on antibiotics, seen by CCs x 2, 2 I&Ds this admission so far last on 02/25/21, all cultures negative to date,  biopsy negative for malignancy.  Wound externally has healed but now patient on 03/12/2021 is complaining more breast discomfort, have requested general surgery to please revisit with the patient, continue to monitor, currently not on antibiotics.  Incidental COVID-19: Off isolation-asymptomatic  ESRD: HD per nephrology.MWF scheudule.  HTN: Controlled-continue clonidine/Coreg  Hyponatremia: Mild-being managed with volume overload with dialysis.  PAD-s/p femoropopliteal bypass-s/p bilateraL TMA   Legally Blind - supportive Rx.  Anemia of chronic disease: Monitor periodically.     Status:  Remains inpatient appropriate because:Unsafe d/c plan and Inpatient level of care appropriate due to severity of illness   Dispo: The patient is from: Homeless              Anticipated d/c is to: ALF vs SNF vs to be determined on 3/1 patient told [facility] staff that she did not wish to pursue skilled nursing facility and instead reported desire to be set up in a hotel.  Medicaid check not available until 3/11.  If medically stable before that date we will ask [facility] supervisor to assist with payment for motel room.              Patient currently is not medically stable to d/c.  Patient has had reemergence of left breast abscess and will be requiring close follow-up as well as oral antibiotics.  Will be undergoing an ultrasound-guided aspiration of the abscess in the left breast on 3/2 and if unsuccessful  may require intraoperative debridement.   Difficult to place patient Yes   Level of care: Med-Surg  Code Status: Full Family Communication:  DVT prophylaxis: SQ Heparin Vaccination status: Unknown-Incidentally Covid positive during this admission (01/15/2021), repeat test ordered 03/10/21 for SNF is now negative  Foley catheter: No  Data  Reviewed: Basic Metabolic Panel: Recent Labs  Lab 03/10/21 0924 03/11/21 1202 03/12/21 0736  NA 133* 134* 128*  K 4.0 3.8 4.5  CL 98 95* 96*  CO2 '23 30 23  '$ GLUCOSE 157* 128* 135*  BUN 32* 20 31*  CREATININE 11.07* 8.34* 10.08*  CALCIUM 10.0 10.2 10.2  PHOS 5.8* 4.5 5.2*   Liver Function Tests: Recent Labs  Lab 03/10/21 0924 03/11/21 1202 03/12/21 0736  ALBUMIN 3.0* 3.1* 3.0*   No results for input(s): LIPASE, AMYLASE in the last 168 hours. No results for input(s): AMMONIA in the last 168 hours. CBC: Recent Labs  Lab 03/10/21 0115 03/11/21 1202 03/12/21 0736  WBC 5.9 5.1 5.4  HGB 10.5* 10.3* 10.2*  HCT 33.2* 33.7* 32.2*  MCV 99.4 100.3* 100.0  PLT 185 183 198   Cardiac Enzymes: No results for input(s): CKTOTAL, CKMB, CKMBINDEX, TROPONINI in the last 168 hours. BNP (last 3 results) Recent Labs    01/18/21 0136 01/19/21 0030 01/20/21 0020  BNP 1,410.7* 1,122.4* 1,383.7*    ProBNP (last 3 results) No results for input(s): PROBNP in the last 8760 hours.  CBG: No results for input(s): GLUCAP in the last 168 hours.  Recent Results (from the past 240 hour(s))  SARS CORONAVIRUS 2 (TAT 6-24 HRS) Nasopharyngeal Nasopharyngeal Swab     Status: None   Collection Time: 03/10/21 10:29 AM   Specimen: Nasopharyngeal Swab  Result Value Ref Range Status   SARS Coronavirus 2 NEGATIVE NEGATIVE Final    Comment: (NOTE) SARS-CoV-2 target nucleic acids are NOT DETECTED.  The SARS-CoV-2 RNA is generally detectable in upper and lower respiratory specimens during the acute phase of infection. Negative results do not preclude SARS-CoV-2 infection, do not rule out co-infections with other pathogens, and should not be used as the sole basis for treatment or other patient management decisions. Negative results must be combined with clinical observations, patient history, and epidemiological information. The expected result is Negative.  Fact Sheet for  Patients: SugarRoll.be  Fact Sheet for Healthcare Providers: https://www.woods-mathews.com/  This test is not yet approved or cleared by the [place] FDA and  has been authorized for detection and/or diagnosis of SARS-CoV-2 by FDA under an Emergency Use Authorization (EUA). This EUA will remain  in effect (meaning this test can be used) for the duration of the COVID-19 declaration under Se ction 564(b)(1) of the Act, 21 U.S.C. section 360bbb-3(b)(1), unless the authorization is terminated or revoked sooner.  Performed at [facility], [place] [address] [place], [place] [address]      Studies: No results found.  Scheduled Meds: . (feeding supplement) PROSource Plus  30 mL Oral BID BM  . carvedilol  25 mg Oral BID WC  . cloNIDine  0.3 mg Oral TID  . darbepoetin (ARANESP) injection - DIALYSIS  150 mcg Intravenous Q Mon-HD  . heparin  5,000 Units Subcutaneous Q8H  . heparin  5,000 Units Dialysis Once in dialysis  . multivitamin  1 tablet Oral QHS  . pregabalin  75 mg Oral QHS  . senna  1 tablet Oral BID  . sevelamer carbonate  3,200 mg Oral TID WC   Continuous Infusions: . sodium chloride    .  sodium chloride 10 mL/hr at 02/27/21 0831  . sodium chloride    . sodium chloride    . sodium chloride    . sodium chloride    . sodium chloride    . sodium chloride    . sodium chloride    . sodium chloride      Active Problems:   ESRD (end stage renal disease) ([place])   Breast abscess   COVID-19 virus infection   Breast abscess of female   Left breast abscess   Poor social situation   Consultants:  Nephrology  General surgery  Interventional radiology  Procedures:   Aspiration of breast abscess per IR 3/1 with further image guided aspiration of complex breast abscess planned for 3/2    Time spent: 15 minutes  56  days  Signature  Lala Lund M.D on 03/12/2021 at 9:19 AM   -  To page go to [email or web address]

## 2021-03-12 NOTE — Plan of Care (Signed)
  Problem: Fluid Volume: Goal: Compliance with measures to maintain balanced fluid volume will improve Outcome: Progressing   

## 2021-03-12 NOTE — Progress Notes (Signed)
[facility] Progress Note   Subjective: Seen on HD.  L breast is tender.  No complaints today.    Objective Vitals:   03/12/21 0800 03/12/21 0815 03/12/21 0830 03/12/21 0845  BP: (!) 85/44 (!) 95/59 (!) 88/50 (!) 86/52  Pulse: 75  76 73  Resp:      Temp:      TempSrc:      SpO2:      Weight:      Height:       Physical Exam General:NAD, sleeping, OK Heart:S1,S2, RRRno M/R/B Lungs:CTABA/P Abdomen:Active BS Extremities:No LE edema. Dialysis Access:L AVF + bruit    Additional Objective Labs: Basic Metabolic Panel: Recent Labs  Lab 03/10/21 0924 03/11/21 1202 03/12/21 0736  NA 133* 134* 128*  K 4.0 3.8 4.5  CL 98 95* 96*  CO2 '23 30 23  '$ GLUCOSE 157* 128* 135*  BUN 32* 20 31*  CREATININE 11.07* 8.34* 10.08*  CALCIUM 10.0 10.2 10.2  PHOS 5.8* 4.5 5.2*   Liver Function Tests: Recent Labs  Lab 03/10/21 0924 03/11/21 1202 03/12/21 0736  ALBUMIN 3.0* 3.1* 3.0*   No results for input(s): LIPASE, AMYLASE in the last 168 hours. CBC: Recent Labs  Lab 03/10/21 0115 03/11/21 1202 03/12/21 0736  WBC 5.9 5.1 5.4  HGB 10.5* 10.3* 10.2*  HCT 33.2* 33.7* 32.2*  MCV 99.4 100.3* 100.0  PLT 185 183 198   Blood Culture    Component Value Date/Time   SDES BREAST 02/25/2021 1242   SPECREQUEST LEFT 02/25/2021 1242   CULT  02/25/2021 1242    No growth aerobically or anaerobically. Performed at [facility], [place] [address] [place], [place] [address]    REPTSTATUS 03/02/2021 FINAL 02/25/2021 1242    Cardiac Enzymes: No results for input(s): CKTOTAL, CKMB, CKMBINDEX, TROPONINI in the last 168 hours. CBG: No results for input(s): GLUCAP in the last 168 hours. Iron Studies: No results for input(s): IRON, TIBC, TRANSFERRIN, FERRITIN in the last 72 hours. '@lablastinr3'$ @ Studies/Results: No results found. Medications: . sodium chloride    . sodium chloride 10 mL/hr at 02/27/21 0831  . sodium chloride    . sodium chloride    .  sodium chloride    . sodium chloride    . sodium chloride    . sodium chloride    . sodium chloride    . sodium chloride     . (feeding supplement) PROSource Plus  30 mL Oral BID BM  . carvedilol  25 mg Oral BID WC  . cloNIDine  0.3 mg Oral TID  . darbepoetin (ARANESP) injection - DIALYSIS  150 mcg Intravenous Q Mon-HD  . heparin  5,000 Units Subcutaneous Q8H  . heparin  5,000 Units Dialysis Once in dialysis  . multivitamin  1 tablet Oral QHS  . pregabalin  75 mg Oral QHS  . senna  1 tablet Oral BID  . sevelamer carbonate  3,200 mg Oral TID WC     Dialysis Orders: [facility] 4h 39mn 2/2.5 bath 105kg AVF  Heparin 7000 units IV initial bolus Heparin 3000 units IV mid run.  parsabiv 15 mgIVthree times a week with HD mircera 150 mcgIVevery 2 weeks - last given 01/13/21 hectorol 3 mcgIVthree times a week  Assessment/Plan: 1.Left breast abscess -s/p I&D abscess 01/15/2021 and 02/25/21 pathology was negative for malignancy on 01/21"although some concern for possible inflammatory breast cancer"repeat biopsy done 03/03 - negative for malignancy.Completed antibiotic course. 2.HD vascular access: Recurrentcannulationissuesw/AVF.IR completed f'gram 01/25/21 showing significant  pseudoaneurysm s/p thrombin injection and near complete thrombosis of pseudoaneurysm per IR.Cannulation issues now appear to have resolved, will continue to monitor. Was on heparin prior to admission but wasn't included in orders. Will resume at lower dose.  3.Covid 19 on admission. Now out of isolation window.  5. ESRD: HD MWF. Next HD today 03/12/21. 6. Secondary hyperparathyroidism - Phos sl elevated -continue Renvela binder. Corr Ca elevated Use 2.0 Ca bath with HD.  7. Anemia. --HGB at goal. Continue weekly ESA.  8. HTN/volume -BP controlled, edema resolved. She isbelow EDW, will need to be lowered at d/c.Reset EDW to 98 kg. 9. Nutrition -Low Albumin. Renal diet with fld restriction.  Add protein supps.   Madelon Lips MD 03/12/2021, 8:51 AM  [facility]

## 2021-03-12 NOTE — Progress Notes (Signed)
15 Days Post-Op  Subjective: CC: Called back to see patient. Patient reports her breast pain has never resolved. She is unsure if it has worsened. She denies any drainage. She denies any subjective fevers, or chills. She has been afebrile overnight.   Objective: Vital signs in last 24 hours: Temp:  [97.7 F (36.5 C)-98.5 F (36.9 C)] 97.7 F (36.5 C) (03/18 1131) Pulse Rate:  [65-79] 73 (03/18 1131) Resp:  [16-18] 18 (03/18 1131) BP: (85-146)/(44-93) 107/76 (03/18 1131) SpO2:  [98 %-100 %] 100 % (03/18 1131) Weight:  [99 kg-104.8 kg] 99 kg (03/18 1101) Last BM Date: 03/10/21  Intake/Output from previous day: 03/17 0701 - 03/18 0700 In: 840 [P.O.:840] Out: 0  Intake/Output this shift: Total I/O In: 240 [P.O.:240] Out: 3400 [Other:3400]  PE: Left Breast: Chaperone present, PA. Patient with left breast induration extending along the superior aspect of the breast around 10 o'clock to the 3 o'clock position (superior-medial to superior lateral). No fluctuance. No drainage. On my exam there does not appear to be any erythema or warmth (picture below used with flash). Wound at 5 o'clock position (inferior lateral) appears to have healed. No drainage. Incision along medial aspect of the breast c/d/i with sutures in place     Lab Results:  Recent Labs    03/11/21 1202 03/12/21 0736  WBC 5.1 5.4  HGB 10.3* 10.2*  HCT 33.7* 32.2*  PLT 183 198   BMET Recent Labs    03/11/21 1202 03/12/21 0736  NA 134* 128*  K 3.8 4.5  CL 95* 96*  CO2 30 23  GLUCOSE 128* 135*  BUN 20 31*  CREATININE 8.34* 10.08*  CALCIUM 10.2 10.2   PT/INR No results for input(s): LABPROT, INR in the last 72 hours. CMP     Component Value Date/Time   NA 128 (L) 03/12/2021 0736   K 4.5 03/12/2021 0736   CL 96 (L) 03/12/2021 0736   CO2 23 03/12/2021 0736   GLUCOSE 135 (H) 03/12/2021 0736   BUN 31 (H) 03/12/2021 0736   CREATININE 10.08 (H) 03/12/2021 0736   CALCIUM 10.2 03/12/2021 0736    PROT 6.0 (L) 01/23/2021 0426   ALBUMIN 3.0 (L) 03/12/2021 0736   AST 46 (H) 01/23/2021 0426   ALT 35 01/23/2021 0426   ALKPHOS 42 01/23/2021 0426   BILITOT 0.7 01/23/2021 0426   GFRNONAA 5 (L) 03/12/2021 0736   GFRAA 5 (L) 02/13/2019 0734   Lipase     Component Value Date/Time   LIPASE 33 07/17/2018 1145       Studies/Results: No results found.  Anti-infectives: Anti-infectives (From admission, onward)   Start     Dose/Rate Route Frequency Ordered Stop   02/27/21 1730  clindamycin (CLEOCIN) capsule 300 mg        300 mg Oral Every 8 hours 02/27/21 1631 03/03/21 0511   02/24/21 2200  amoxicillin-clavulanate (AUGMENTIN) 500-125 MG per tablet 500 mg  Status:  Discontinued        1 tablet Oral Daily at bedtime 02/23/21 1149 02/24/21 0817   02/24/21 1200  clindamycin (CLEOCIN) capsule 300 mg  Status:  Discontinued        300 mg Oral Every 6 hours 02/24/21 0817 02/24/21 0952   02/24/21 1200  clindamycin (CLEOCIN) IVPB 600 mg  Status:  Discontinued        600 mg 100 mL/hr over 30 Minutes Intravenous Every 6 hours 02/24/21 0952 02/27/21 1631   02/23/21 1245  amoxicillin-clavulanate (AUGMENTIN) 500-125 MG  per tablet 500 mg        1 tablet Oral  Once 02/23/21 1149 02/23/21 1445   02/23/21 1215  amoxicillin-clavulanate (AUGMENTIN) 875-125 MG per tablet 1 tablet  Status:  Discontinued        1 tablet Oral Every 12 hours 02/23/21 1120 02/23/21 1149   01/23/21 1400  amoxicillin-clavulanate (AUGMENTIN) 500-125 MG per tablet 500 mg        500 mg Oral Every 24 hours 01/23/21 1124 01/25/21 1657   01/23/21 1015  amoxicillin-clavulanate (AUGMENTIN) 875-125 MG per tablet 1 tablet  Status:  Discontinued        1 tablet Oral Every 12 hours 01/23/21 1002 01/23/21 1124   01/20/21 0845  vancomycin (VANCOCIN) 1-5 GM/200ML-% IVPB       Note to Pharmacy: Judieth Keens  : cabinet override      01/20/21 0845 01/20/21 2059   01/18/21 1200  vancomycin (VANCOCIN) IVPB 1000 mg/200 mL premix  Status:   Discontinued        1,000 mg 200 mL/hr over 60 Minutes Intravenous Every M-W-F (Hemodialysis) 01/16/21 1017 01/21/21 1324   01/17/21 2200  Ampicillin-Sulbactam (UNASYN) 3 g in sodium chloride 0.9 % 100 mL IVPB  Status:  Discontinued        3 g 200 mL/hr over 30 Minutes Intravenous Daily at bedtime 01/16/21 1058 01/23/21 1002   01/17/21 1000  remdesivir 100 mg in sodium chloride 0.9 % 100 mL IVPB       "Followed by" Linked Group Details   100 mg 200 mL/hr over 30 Minutes Intravenous Daily 01/15/21 2357 01/18/21 0918   01/16/21 1145  Ampicillin-Sulbactam (UNASYN) 3 g in sodium chloride 0.9 % 100 mL IVPB        3 g 200 mL/hr over 30 Minutes Intravenous  Once 01/16/21 1058 01/16/21 1417   01/16/21 1100  vancomycin (VANCOREADY) IVPB 1500 mg/300 mL        1,500 mg 150 mL/hr over 120 Minutes Intravenous  Once 01/16/21 1017 01/16/21 1849   01/16/21 0300  remdesivir 200 mg in sodium chloride 0.9% 250 mL IVPB       "Followed by" Linked Group Details   200 mg 580 mL/hr over 30 Minutes Intravenous Once 01/15/21 2357 01/16/21 0543   01/15/21 2345  amoxicillin-clavulanate (AUGMENTIN) 875-125 MG per tablet 1 tablet  Status:  Discontinued        1 tablet Oral Every 12 hours 01/15/21 2334 01/15/21 2357   01/15/21 1800  vancomycin (VANCOCIN) IVPB 1000 mg/200 mL premix        1,000 mg 200 mL/hr over 60 Minutes Intravenous  Once 01/15/21 1753 01/15/21 2120       Assessment/Plan ESRD- M/W/F PAD Blind Chronic anemia Obesity BMI 40.76  RecurrentLeft breast abscess S/pIncision and drainage of left breast abscess with excisional debridement-01/15/21 -Dr. Zenia Resides S/p IR US guided aspiration - Dr. Pascal Lux- 02/24/21 - yielding 3cc of purulent appearing thick fluid. Cx's pending and NGTD.  S/pIncision and drainage of left breast wound, left breast biopsy- Dr. Zenia Resides- 02/25/21  -path was negative for malignancy1/21 and 3/3, although still some concern for possible inflammatory breast  cancer.Willbenefit from adiagnostic mammogram as outpatient - Reviewed patients case with Dr. Marlou Starks. No indication for abx or further surgery at this time. If worsens could consider [place] and aspiration but no indication for I&D at this time. Please call back if we can be of any assistance.     LOS: 56 days    Jillyn Ledger ,  PA-C [facility] 03/12/2021, 1:37 PM Please see Amion for pager number during day hours 7:00am-4:30pm

## 2021-03-12 NOTE — Progress Notes (Addendum)
2:30pm: CSW spoke with [place] [facility] who states she is coming to meet with patient in person at approximately 3:30pm today.  CSW notified patient, Financial controller, and staff at main entrance of planned visit.   1pm: CSW spoke with [place] [facility] of [facility] to discuss patient - clinicals sent via fax for review.  CSW spoke with Donneta Romberg at [facility] to discuss patient - updated PT note and [facility] sent via fax for review.  12pm: CSW spoke with patient at bedside to discuss barriers to SNF placement, as she is long term and does not have a skillable need - she stated understanding and is agreement to placement in a [facility] if a bed is available. Patient reports she has been in [place] for 3 years and enjoys it here, but understands that placement may not be available here.  11am: CSW was advised to reach out to [facility] to determine bed availability and if they can offer this patient admission.  8:45am: CSW attempted to reach Cherene Altes at [place] [phone]) without success - additional voicemail left requesting a return call.  CSW attempted to reach admissions at [facility] - a voicemail was left requesting a return call.  Madilyn Fireman, MSW, LCSW Transitions of Care  Clinical Social Worker II [phone]

## 2021-03-12 NOTE — Procedures (Signed)
Patient seen and examined on Hemodialysis. BP (!) 86/52   Pulse 73   Temp 98.4 F (36.9 C) (Oral)   Resp 18   Ht '5\' 4"'$  (1.626 m)   Wt 102.6 kg   SpO2 99%   BMI 38.83 kg/m   QB 350 AVF UF goal 4L  Tolerating treatment without complaints at this time.   Madelon Lips MD [facility] pgr [phone] 8:54 AM

## 2021-03-13 DIAGNOSIS — N611 Abscess of the breast and nipple: Secondary | ICD-10-CM | POA: Diagnosis not present

## 2021-03-13 NOTE — Progress Notes (Signed)
[facility] PROGRESS NOTE  Rebekah Burton [id number] DOB: 07-Sep-1976 DOA: 01/15/2021 PCP: Trey Sailors, PA   HPI:    45 y.o. female with PMH significant for ESRD on HD MWF, blindness, hypertension, PAD. Patient presented to the ED on 01/15/2021 with breast abscess, underwent I&D by general surgery.  She was initially treated with IV antibiotics later completed a course with oral antibiotics.  She was also found to be incidental Covid positive.  Hospital course complicated by difficulty with cannulation of aVF, vascular surgery was consulted, underwent fistulogram and was found to have pseudoaneurysm. IR and vascular were consulted. aVF is now working.Dialysis per nephrology.  Further hospital course complicated by recurrence of breast abscess-s/p ultrasound guided drainage on 3/2-and then incision and drainage by general surgery on 3/3.  See below for further details   Subjective: Patient in bed, appears comfortable, denies any headache, no fever, no chest pain or pressure, no shortness of breath , no abdominal pain. No focal weakness. Some L breast pain.    Objective: Vitals:   03/12/21 2327 03/13/21 0500  BP: 109/66 (!) 145/83  Pulse: 75 71  Resp: 18 16  Temp: 98.3 F (36.8 C) 98.6 F (37 C)  SpO2: 99% 98%    Intake/Output Summary (Last 24 hours) at 03/13/2021 0924 Last data filed at 03/12/2021 2046 Gross per 24 hour  Intake 720 ml  Output 3400 ml  Net -2680 ml   Filed Weights   03/11/21 2200 03/12/21 0715 03/12/21 1101  Weight: 104.8 kg 102.6 kg 99 kg    Exam:  Awake Alert, No new F.N deficits, Normal affect [place].AT,PERRAL Supple Neck,No JVD, No cervical lymphadenopathy appriciated.  Symmetrical Chest wall movement, Good air movement bilaterally, CTAB RRR,No Gallops, Rubs or new Murmurs, No Parasternal Heave +ve B.Sounds, Abd Soft, No tenderness, No organomegaly appriciated, No rebound - guarding or rigidity. No Cyanosis, bilateral transmetatarsal  amputations   Assessment/Plan:  Breast abscess/recurrent: Afebrile-no leukocytosis- no longer on antibiotics, seen by CCs x 2, 2 I&Ds this admission so far last on 02/25/21, all cultures negative to date,  biopsy negative for malignancy.  She has finished her antibiotic treatment and externally wound is almost completely healed, seen by general surgery again on 03/12/2021, stable no further interventions needed.  Incidental COVID-19: Off isolation-asymptomatic, now tested negative 03/12/21.  ESRD: HD per nephrology.MWF scheudule.  HTN: Controlled-continue clonidine/Coreg  Hyponatremia: Mild-being managed with volume overload with dialysis.  PAD-s/p femoropopliteal bypass-s/p bilateraL TMA   Legally Blind - supportive Rx.  Anemia of chronic disease: Monitor periodically.     Status:  Remains inpatient appropriate because:Unsafe d/c plan and Inpatient level of care appropriate due to severity of illness   Dispo: The patient is from: Homeless              Anticipated d/c is to: ALF vs SNF vs to be determined on 3/1 patient told [facility] staff that she did not wish to pursue skilled nursing facility and instead reported desire to be set up in a hotel.  Medicaid check not available until 3/11.  If medically stable before that date we will ask [facility] supervisor to assist with payment for motel room.              Patient currently is not medically stable to d/c.  Patient has had reemergence of left breast abscess and will be requiring close follow-up as well as oral antibiotics.  Will be undergoing an ultrasound-guided aspiration of the abscess in the left breast on 3/2  and if unsuccessful may require intraoperative debridement.   Difficult to place patient Yes   Level of care: Med-Surg  Code Status: Full Family Communication:  DVT prophylaxis: SQ Heparin Vaccination status: Unknown-Incidentally Covid positive during this admission (01/15/2021), repeat test ordered 03/10/21 for SNF is now  negative  Foley catheter: No  Data Reviewed: Basic Metabolic Panel: Recent Labs  Lab 03/10/21 0924 03/11/21 1202 03/12/21 0736  NA 133* 134* 128*  K 4.0 3.8 4.5  CL 98 95* 96*  CO2 '23 30 23  '$ GLUCOSE 157* 128* 135*  BUN 32* 20 31*  CREATININE 11.07* 8.34* 10.08*  CALCIUM 10.0 10.2 10.2  PHOS 5.8* 4.5 5.2*   Liver Function Tests: Recent Labs  Lab 03/10/21 0924 03/11/21 1202 03/12/21 0736  ALBUMIN 3.0* 3.1* 3.0*   No results for input(s): LIPASE, AMYLASE in the last 168 hours. No results for input(s): AMMONIA in the last 168 hours. CBC: Recent Labs  Lab 03/10/21 0115 03/11/21 1202 03/12/21 0736  WBC 5.9 5.1 5.4  HGB 10.5* 10.3* 10.2*  HCT 33.2* 33.7* 32.2*  MCV 99.4 100.3* 100.0  PLT 185 183 198   Cardiac Enzymes: No results for input(s): CKTOTAL, CKMB, CKMBINDEX, TROPONINI in the last 168 hours. BNP (last 3 results) Recent Labs    01/18/21 0136 01/19/21 0030 01/20/21 0020  BNP 1,410.7* 1,122.4* 1,383.7*    ProBNP (last 3 results) No results for input(s): PROBNP in the last 8760 hours.  CBG: No results for input(s): GLUCAP in the last 168 hours.  Recent Results (from the past 240 hour(s))  SARS CORONAVIRUS 2 (TAT 6-24 HRS) Nasopharyngeal Nasopharyngeal Swab     Status: None   Collection Time: 03/10/21 10:29 AM   Specimen: Nasopharyngeal Swab  Result Value Ref Range Status   SARS Coronavirus 2 NEGATIVE NEGATIVE Final    Comment: (NOTE) SARS-CoV-2 target nucleic acids are NOT DETECTED.  The SARS-CoV-2 RNA is generally detectable in upper and lower respiratory specimens during the acute phase of infection. Negative results do not preclude SARS-CoV-2 infection, do not rule out co-infections with other pathogens, and should not be used as the sole basis for treatment or other patient management decisions. Negative results must be combined with clinical observations, patient history, and epidemiological information. The expected result is  Negative.  Fact Sheet for Patients: SugarRoll.be  Fact Sheet for Healthcare Providers: https://www.woods-mathews.com/  This test is not yet approved or cleared by the [place] FDA and  has been authorized for detection and/or diagnosis of SARS-CoV-2 by FDA under an Emergency Use Authorization (EUA). This EUA will remain  in effect (meaning this test can be used) for the duration of the COVID-19 declaration under Se ction 564(b)(1) of the Act, 21 U.S.C. section 360bbb-3(b)(1), unless the authorization is terminated or revoked sooner.  Performed at [facility], [place] [address] [place], [place] [address]      Studies: No results found.  Scheduled Meds: . (feeding supplement) PROSource Plus  30 mL Oral BID BM  . carvedilol  25 mg Oral BID WC  . cloNIDine  0.3 mg Oral TID  . darbepoetin (ARANESP) injection - DIALYSIS  150 mcg Intravenous Q Mon-HD  . heparin  5,000 Units Subcutaneous Q8H  . multivitamin  1 tablet Oral QHS  . pregabalin  75 mg Oral QHS  . senna  1 tablet Oral BID  . sevelamer carbonate  3,200 mg Oral TID WC   Continuous Infusions: . sodium chloride    . sodium chloride 10 mL/hr  at 02/27/21 0831    Active Problems:   ESRD (end stage renal disease) ([place])   Breast abscess   COVID-19 virus infection   Breast abscess of female   Left breast abscess   Poor social situation   Consultants:  Nephrology  General surgery  Interventional radiology  Procedures:   Aspiration of breast abscess per IR 3/1 with further image guided aspiration of complex breast abscess planned for 3/2    Time spent: 15 minutes  10  days  Signature  Lala Lund M.D on 03/13/2021 at 9:24 AM   -  To page go to [email or web address]

## 2021-03-13 NOTE — Progress Notes (Signed)
[facility] Progress Note   Subjective:   Seen in room, eating breakfast. Reports feeling well, denies CP, SOB, dizziness, and nausea.   Objective Vitals:   03/12/21 1634 03/12/21 2045 03/12/21 2327 03/13/21 0500  BP: 102/68 113/77 109/66 (!) 145/83  Pulse: 79 82 75 71  Resp: '18 18 18 16  '$ Temp: 98.1 F (36.7 C) 98.8 F (37.1 C) 98.3 F (36.8 C) 98.6 F (37 C)  TempSrc: Oral Oral Oral   SpO2: 99% 99% 99% 98%  Weight:      Height:       Physical Exam General: WDWN, alert and in NAD Heart: RRR, no murmur Lungs: CTA bilaterally without wheezing, rhonchi or rales Abdomen: Soft, nontender, nondistended, +BS Extremities: no edema b/l lower extremities Dialysis Access: LUE AVF + bruit  Additional Objective Labs: Basic Metabolic Panel: Recent Labs  Lab 03/10/21 0924 03/11/21 1202 03/12/21 0736  NA 133* 134* 128*  K 4.0 3.8 4.5  CL 98 95* 96*  CO2 '23 30 23  '$ GLUCOSE 157* 128* 135*  BUN 32* 20 31*  CREATININE 11.07* 8.34* 10.08*  CALCIUM 10.0 10.2 10.2  PHOS 5.8* 4.5 5.2*   Liver Function Tests: Recent Labs  Lab 03/10/21 0924 03/11/21 1202 03/12/21 0736  ALBUMIN 3.0* 3.1* 3.0*   CBC: Recent Labs  Lab 03/10/21 0115 03/11/21 1202 03/12/21 0736  WBC 5.9 5.1 5.4  HGB 10.5* 10.3* 10.2*  HCT 33.2* 33.7* 32.2*  MCV 99.4 100.3* 100.0  PLT 185 183 198   Blood Culture    Component Value Date/Time   SDES BREAST 02/25/2021 1242   SPECREQUEST LEFT 02/25/2021 1242   CULT  02/25/2021 1242    No growth aerobically or anaerobically. Performed at [facility], [place] [address] [place], [place] [address]    REPTSTATUS 03/02/2021 FINAL 02/25/2021 1242    Medications: . sodium chloride    . sodium chloride 10 mL/hr at 02/27/21 0831   . (feeding supplement) PROSource Plus  30 mL Oral BID BM  . carvedilol  25 mg Oral BID WC  . cloNIDine  0.3 mg Oral TID  . darbepoetin (ARANESP) injection - DIALYSIS  150 mcg Intravenous Q Mon-HD  . heparin   5,000 Units Subcutaneous Q8H  . multivitamin  1 tablet Oral QHS  . pregabalin  75 mg Oral QHS  . senna  1 tablet Oral BID  . sevelamer carbonate  3,200 mg Oral TID WC    Dialysis Orders:  [place] 4h 56mn 2/2.5 bath 105kg AVF  Heparin 7000 units IV initial bolus Heparin 3000 units IV mid run.  parsabiv 15 mgIVthree times a week with HD mircera 150 mcgIVevery 2 weeks - last given 01/13/21 hectorol 3 mcgIVthree times a week  Assessment/Plan:  1.Left breast abscess -s/p I&D abscess 01/15/2021 and 02/25/21 pathology was negative for malignancy on 01/21"although some concern for possible inflammatory breast cancer"repeat biopsy done 03/03 - negative for malignancy.Completed antibiotic course. 2.HD vascular access: Recurrentcannulationissuesw/AVF.IR completed f'gram 01/25/21 showing significant pseudoaneurysm s/p thrombin injection and near complete thrombosis of pseudoaneurysm per IR.Cannulation issues now appear to have resolved, will continue to monitor. Low dose heparin resumed.  3.Covid 19 on admission. Now out of isolation window.  5. ESRD: HD MWF. Next HD Monday. 6. Secondary hyperparathyroidism - Phos at goal, continue Renvela binder. Corr Ca elevated Use 2.0 Ca bath with HD.  7. Anemia. --HGB at goal. Continue weekly ESA.  8. HTN/volume -BP controlled, edema resolved. She isbelow EDW, will need to be  lowered at d/c. 9. Nutrition -Low Albumin. Renal diet with fld restriction. Add protein supps.  Anice Paganini, PA-C 03/13/2021, 8:50 AM  [facility] Pager: [phone]

## 2021-03-14 LAB — RENAL FUNCTION PANEL
Albumin: 3 g/dL — ABNORMAL LOW (ref 3.5–5.0)
Anion gap: 12 (ref 5–15)
BUN: 38 mg/dL — ABNORMAL HIGH (ref 6–20)
CO2: 28 mmol/L (ref 22–32)
Calcium: 10.4 mg/dL — ABNORMAL HIGH (ref 8.9–10.3)
Chloride: 88 mmol/L — ABNORMAL LOW (ref 98–111)
Creatinine, Ser: 10.37 mg/dL — ABNORMAL HIGH (ref 0.44–1.00)
GFR, Estimated: 4 mL/min — ABNORMAL LOW (ref >60)
Glucose, Bld: 148 mg/dL — ABNORMAL HIGH (ref 70–99)
Phosphorus: 4.8 mg/dL — ABNORMAL HIGH (ref 2.5–4.6)
Potassium: 4.1 mmol/L (ref 3.5–5.1)
Sodium: 128 mmol/L — ABNORMAL LOW (ref 135–145)

## 2021-03-14 LAB — CBC
HCT: 32.3 % — ABNORMAL LOW (ref 36.0–46.0)
Hemoglobin: 9.9 g/dL — ABNORMAL LOW (ref 12.0–15.0)
MCH: 30.5 pg (ref 26.0–34.0)
MCHC: 30.7 g/dL (ref 30.0–36.0)
MCV: 99.4 fL (ref 80.0–100.0)
Platelets: 202 10*3/uL (ref 150–400)
RBC: 3.25 MIL/uL — ABNORMAL LOW (ref 3.87–5.11)
RDW: 17.7 % — ABNORMAL HIGH (ref 11.5–15.5)
WBC: 6.3 10*3/uL (ref 4.0–10.5)
nRBC: 0 % (ref 0.0–0.2)

## 2021-03-14 MED ORDER — SODIUM CHLORIDE 0.9 % IV SOLN: 100.0000 mL | INTRAVENOUS | Status: DC | PRN

## 2021-03-14 MED ORDER — LIDOCAINE-PRILOCAINE 2.5-2.5 % EX CREA: 1.0000 "application " | TOPICAL_CREAM | CUTANEOUS | Status: DC | PRN

## 2021-03-14 MED ORDER — PENTAFLUOROPROP-TETRAFLUOROETH EX AERO
1.0000 | INHALATION_SPRAY | CUTANEOUS | Status: DC | PRN
Start: 2021-03-14 — End: 2021-03-15

## 2021-03-14 MED ORDER — HEPARIN SODIUM (PORCINE) 1000 UNIT/ML DIALYSIS
1000.0000 [IU] | INTRAMUSCULAR | Status: DC | PRN
Start: 2021-03-14 — End: 2021-03-15
  Administered 2021-03-15: 1000 [IU] via INTRAVENOUS_CENTRAL

## 2021-03-14 MED ORDER — ALTEPLASE 2 MG IJ SOLR: 2.0000 mg | Freq: Once | INTRAMUSCULAR | Status: DC | PRN

## 2021-03-14 MED ORDER — CHLORHEXIDINE GLUCONATE CLOTH 2 % EX PADS
6.0000 | MEDICATED_PAD | Freq: Every day | CUTANEOUS | Status: DC
  Administered 2021-03-18: 6 via TOPICAL

## 2021-03-14 MED ORDER — LIDOCAINE HCL (PF) 1 % IJ SOLN
5.0000 mL | INTRAMUSCULAR | Status: DC | PRN
Start: 2021-03-14 — End: 2021-03-15

## 2021-03-14 NOTE — Plan of Care (Signed)
  Problem: Pain Managment: Goal: General experience of comfort will improve Outcome: Progressing   

## 2021-03-14 NOTE — Progress Notes (Signed)
[facility] Progress Note   Subjective:     Patient seen and examined at bedside today. No issues or concerns. Denies SOB, CP, ABD pain, and N/V/D. Awaiting placement. Plan is for scheduled HD treatment tomorrow 03/15/21.   Objective Vitals:   03/13/21 1648 03/13/21 2100 03/14/21 0554 03/14/21 0944  BP: 124/64 125/84 104/69 116/62  Pulse: 75 79 74 74  Resp: '18 14 16 16  '$ Temp: 98.4 F (36.9 C) 97.9 F (36.6 C) 98.2 F (36.8 C) 98.3 F (36.8 C)  TempSrc: Oral Oral Oral Oral  SpO2: 100% 98% 100% 98%  Weight:      Height:       Physical Exam General: Resting in bed, no acute respiratory distress Heart: Normal S1 and S2; No murmurs, gallops, or friction rub Lungs: Clear throughout; No wheezing, rales, or rhonchi Abdomen: soft, non-tender, active bowel sounds Extremities: No edema bilateral lower extremities Dialysis Access: L AVF (+) Bruit/Thrill  Filed Weights   03/11/21 2200 03/12/21 0715 03/12/21 1101  Weight: 104.8 kg 102.6 kg 99 kg    Intake/Output Summary (Last 24 hours) at 03/14/2021 1117 Last data filed at 03/14/2021 0740 Gross per 24 hour  Intake 717 ml  Output --  Net 717 ml    Additional Objective Labs: Basic Metabolic Panel: Recent Labs  Lab 03/10/21 0924 03/11/21 1202 03/12/21 0736  NA 133* 134* 128*  K 4.0 3.8 4.5  CL 98 95* 96*  CO2 '23 30 23  '$ GLUCOSE 157* 128* 135*  BUN 32* 20 31*  CREATININE 11.07* 8.34* 10.08*  CALCIUM 10.0 10.2 10.2  PHOS 5.8* 4.5 5.2*   Liver Function Tests: Recent Labs  Lab 03/10/21 0924 03/11/21 1202 03/12/21 0736  ALBUMIN 3.0* 3.1* 3.0*   CBC: Recent Labs  Lab 03/10/21 0115 03/11/21 1202 03/12/21 0736 03/14/21 1027  WBC 5.9 5.1 5.4 6.3  HGB 10.5* 10.3* 10.2* 9.9*  HCT 33.2* 33.7* 32.2* 32.3*  MCV 99.4 100.3* 100.0 99.4  PLT 185 183 198 202   Blood Culture    Component Value Date/Time   SDES BREAST 02/25/2021 1242   SPECREQUEST LEFT 02/25/2021 1242   CULT  02/25/2021 1242    No  growth aerobically or anaerobically. Performed at [facility], [place] [address] [place], [place] [address]    REPTSTATUS 03/02/2021 FINAL 02/25/2021 1242    Medications: . sodium chloride    . sodium chloride 10 mL/hr at 02/27/21 0831   . (feeding supplement) PROSource Plus  30 mL Oral BID BM  . carvedilol  25 mg Oral BID WC  . cloNIDine  0.3 mg Oral TID  . darbepoetin (ARANESP) injection - DIALYSIS  150 mcg Intravenous Q Mon-HD  . heparin  5,000 Units Subcutaneous Q8H  . multivitamin  1 tablet Oral QHS  . pregabalin  75 mg Oral QHS  . senna  1 tablet Oral BID  . sevelamer carbonate  3,200 mg Oral TID WC    Dialysis Orders: [place] 4h 58mn 2/2.5 bath 105kg AVF  Heparin 7000 units IV initial bolus Heparin 3000 units IV mid run.  parsabiv 15 mgIVthree times a week with HD mircera 150 mcgIVevery 2 weeks - last given 01/13/21 hectorol 3 mcgIVthree times a week  Assessment/Plan: 1.Left breast abscess -s/p I&D abscess 01/15/2021 and 02/25/21 pathology was negative for malignancy on 01/21"although some concern for possible inflammatory breast cancer"repeat biopsy done 03/03 - negative for malignancy.Completed antibiotic course. 2.HD vascular access: Recurrentcannulationissuesw/AVF.IR completed f'gram 01/25/21 showing significant pseudoaneurysm s/p thrombin  injection and near complete thrombosis of pseudoaneurysm per IR.Cannulation issues now appear to have resolved, will continue to monitor. Low dose heparin resumed.  3.Covid 19 on admission. Now out of isolation window.  5. ESRD: HD MWF. Next HD Monday 03/15/21. 6. Secondary hyperparathyroidism - Phos at goal, continue Renvela binder. Corr Ca elevated Use 2.0 Ca bath with HD.  7. Anemia. --HGB now at 9.9. Will order CBC pre-HD. Continue weekly ESA.  8. HTN/volume -BP controlled, edema resolved. She isbelow EDW, will need to be lowered at d/c. 9. Nutrition -Low Albumin. Renal diet with fld  restriction. Add protein supps. Tobie Poet, NP [facility] 03/14/2021,11:17 AM  LOS: 58 days

## 2021-03-15 DIAGNOSIS — U071 COVID-19: Secondary | ICD-10-CM | POA: Diagnosis not present

## 2021-03-15 DIAGNOSIS — N611 Abscess of the breast and nipple: Secondary | ICD-10-CM | POA: Diagnosis not present

## 2021-03-15 DIAGNOSIS — T829XXA Unspecified complication of cardiac and vascular prosthetic device, implant and graft, initial encounter: Secondary | ICD-10-CM | POA: Diagnosis not present

## 2021-03-15 LAB — RENAL FUNCTION PANEL
Albumin: 3.1 g/dL — ABNORMAL LOW (ref 3.5–5.0)
Anion gap: 12 (ref 5–15)
BUN: 46 mg/dL — ABNORMAL HIGH (ref 6–20)
CO2: 29 mmol/L (ref 22–32)
Calcium: 10.7 mg/dL — ABNORMAL HIGH (ref 8.9–10.3)
Chloride: 88 mmol/L — ABNORMAL LOW (ref 98–111)
Creatinine, Ser: 12.54 mg/dL — ABNORMAL HIGH (ref 0.44–1.00)
GFR, Estimated: 3 mL/min — ABNORMAL LOW (ref >60)
Glucose, Bld: 145 mg/dL — ABNORMAL HIGH (ref 70–99)
Phosphorus: 5.5 mg/dL — ABNORMAL HIGH (ref 2.5–4.6)
Potassium: 4.5 mmol/L (ref 3.5–5.1)
Sodium: 129 mmol/L — ABNORMAL LOW (ref 135–145)

## 2021-03-15 LAB — CBC
HCT: 33.7 % — ABNORMAL LOW (ref 36.0–46.0)
Hemoglobin: 10.3 g/dL — ABNORMAL LOW (ref 12.0–15.0)
MCH: 30.7 pg (ref 26.0–34.0)
MCHC: 30.6 g/dL (ref 30.0–36.0)
MCV: 100.3 fL — ABNORMAL HIGH (ref 80.0–100.0)
Platelets: 211 10*3/uL (ref 150–400)
RBC: 3.36 MIL/uL — ABNORMAL LOW (ref 3.87–5.11)
RDW: 17.6 % — ABNORMAL HIGH (ref 11.5–15.5)
WBC: 6 10*3/uL (ref 4.0–10.5)
nRBC: 0 % (ref 0.0–0.2)

## 2021-03-15 LAB — HEPATITIS B SURFACE ANTIGEN: Hepatitis B Surface Ag: NONREACTIVE

## 2021-03-15 LAB — HEPATITIS B CORE ANTIBODY, IGM: Hep B C IgM: NONREACTIVE

## 2021-03-15 MED ORDER — PANTOPRAZOLE SODIUM 40 MG PO TBEC
40.0000 mg | DELAYED_RELEASE_TABLET | Freq: Every day | ORAL | Status: DC
  Administered 2021-03-15 – 2021-03-18 (×4): 40 mg via ORAL
  Filled 2021-03-15 (×5): qty 1

## 2021-03-15 MED ORDER — LIDOCAINE HCL (PF) 1 % IJ SOLN
10.0000 mL | Freq: Once | INTRAMUSCULAR | Status: AC
  Administered 2021-03-16: 10 mL
  Filled 2021-03-15: qty 10

## 2021-03-15 MED ORDER — OXYCODONE HCL 5 MG PO TABS
ORAL_TABLET | ORAL | Status: AC
  Filled 2021-03-15: qty 2

## 2021-03-15 MED ORDER — HEPARIN SODIUM (PORCINE) 1000 UNIT/ML IJ SOLN
INTRAMUSCULAR | Status: AC
  Filled 2021-03-15: qty 7

## 2021-03-15 MED ORDER — IBUPROFEN 600 MG PO TABS
600.0000 mg | ORAL_TABLET | Freq: Three times a day (TID) | ORAL | Status: DC
  Administered 2021-03-15 – 2021-03-18 (×9): 600 mg via ORAL
  Filled 2021-03-15 (×12): qty 1

## 2021-03-15 MED ORDER — DARBEPOETIN ALFA 150 MCG/0.3ML IJ SOSY
PREFILLED_SYRINGE | INTRAMUSCULAR | Status: AC
  Filled 2021-03-15: qty 0.3

## 2021-03-15 NOTE — Progress Notes (Signed)
Subjective: On hemodialysis no complaints, decrease 5 L goal  with low blood pressure on HD.  No shortness of breath no chest pain no complaints  Objective Vital signs in last 24 hours: Vitals:   03/15/21 0838 03/15/21 0900 03/15/21 0930 03/15/21 0956  BP: (!) 95/40 (!) 88/54 (!) 93/53 96/65  Pulse: 70 70 71 77  Resp:      Temp:      TempSrc:      SpO2:      Weight:      Height:       Weight change:   Physical Exam: General: Alert obese plan female on HD NAD Heart: RRR, no MRG Lungs: CTA nonlabored breathing room air Abdomen: Obese bowel sounds normoactive, soft ND NT Extremities: No pedal edema Dialysis Access: Left arm AV fistula patent on hemodialysis  Dialysis Orders: [place] 4h 30mn 2/2.5 bath 105kg AVF  Heparin 7000 units IV initial bolus Heparin 3000 units IV mid run.  parsabiv 15 mgIVthree times a week with HD mircera 150 mcgIVevery 2 weeks - last given 01/13/21 hectorol 3 mcgIVthree times a week  Problem/Plan: 1.. ESRD: HD MWF.  HD on schedule  2. Left breast abscess -per [facility] and admit team s/p I&D abscess 01/15/2021 and 02/25/21 path neg.for malignancy on 01/21and 02/26/2019 .Completed antibiotic course. 2.HD vascular access:  Had recurrentcannulationissuesw/AVF.IR completed f'gram 01/25/21  Sig. pseudoaneurysm s/p thrombin injection and near complete thrombosis of pseudoaneurysm per IR; issues now resolvedLow dose heparin resumed. 3.Covid 19 on admission. Now out of isolation window.  6. Secondary hyperparathyroidism - Phosat goal,on Renvela binder. Corr Ca elevated Use 2.0 Ca bath with HD.  No vitamin D 7. Anemia. --HGB  10.3.   Aranesp q. weekly Monday 150 8. HTN/volume -BP controlled, edema resolved.  Will be below EDW lower at d/c.  Some hyponatremia past week today back off initial 5 L order with low blood pressure attempt 2 and half to 3 9. Nutrition -Albumin 3.1. Renal diet .  On Prosource supplement  DErnest Haber  PA-C [facility] [phone]/21/2022,10:05 AM  LOS: 59 days   Labs: Basic Metabolic Panel: Recent Labs  Lab 03/12/21 0736 03/14/21 1027 03/15/21 0708  NA 128* 128* 129*  K 4.5 4.1 4.5  CL 96* 88* 88*  CO2 '23 28 29  '$ GLUCOSE 135* 148* 145*  BUN 31* 38* 46*  CREATININE 10.08* 10.37* 12.54*  CALCIUM 10.2 10.4* 10.7*  PHOS 5.2* 4.8* 5.5*   Liver Function Tests: Recent Labs  Lab 03/12/21 0736 03/14/21 1027 03/15/21 0708  ALBUMIN 3.0* 3.0* 3.1*   No results for input(s): LIPASE, AMYLASE in the last 168 hours. No results for input(s): AMMONIA in the last 168 hours. CBC: Recent Labs  Lab 03/10/21 0115 03/11/21 1202 03/12/21 0736 03/14/21 1027 03/15/21 0708  WBC 5.9 5.1 5.4 6.3 6.0  HGB 10.5* 10.3* 10.2* 9.9* 10.3*  HCT 33.2* 33.7* 32.2* 32.3* 33.7*  MCV 99.4 100.3* 100.0 99.4 100.3*  PLT 185 183 198 202 211   Cardiac Enzymes: No results for input(s): CKTOTAL, CKMB, CKMBINDEX, TROPONINI in the last 168 hours. CBG: No results for input(s): GLUCAP in the last 168 hours.  Studies/Results: No results found. Medications: . sodium chloride    . sodium chloride 10 mL/hr at 02/27/21 0831  . sodium chloride    . sodium chloride     . (feeding supplement) PROSource Plus  30 mL Oral BID BM  . carvedilol  25 mg Oral BID WC  . Chlorhexidine  Gluconate Cloth  6 each Topical [id number]  . cloNIDine  0.3 mg Oral TID  . darbepoetin (ARANESP) injection - DIALYSIS  150 mcg Intravenous Q Mon-HD  . heparin  5,000 Units Subcutaneous Q8H  . heparin sodium (porcine)      . ibuprofen  600 mg Oral TID  . multivitamin  1 tablet Oral QHS  . oxyCODONE      . pantoprazole  40 mg Oral Daily  . pregabalin  75 mg Oral QHS  . senna  1 tablet Oral BID  . sevelamer carbonate  3,200 mg Oral TID WC

## 2021-03-15 NOTE — Progress Notes (Signed)
Renal Navigator updated by CSW/C. Shon Baton that patient has been accepted at a family care home in [place] (see her note from today). Navigator has discussed transfer with [facility] and [facility] and the [facility] clinic in [place] is much closer for patient's new residence, so Navigator has submitted a referral for transfer there: [facility] in [place]. Navigator has requested update HepB panel to complete referral. Navigator contacted [facility] to request that they fax Guest Services patient's [phone], and her most recent Care Plan and 3 treatment sheets.  Navigator will follow closely.   Alphonzo Cruise, [place] [facility] Navigator [phone]

## 2021-03-15 NOTE — Progress Notes (Signed)
18 Days Post-Op  Subjective: CC: Called by [facility] today for purulent drainage from patients left breast She reports drainage started this morning. Left breast pain is stable from when I saw her on Friday. No fevers overnight.   Objective: Vital signs in last 24 hours: Temp:  [97.3 F (36.3 C)-98.4 F (36.9 C)] 97.3 F (36.3 C) (03/21 1300) Pulse Rate:  [53-81] 75 (03/21 1300) Resp:  [14-20] 16 (03/21 1300) BP: (81-137)/(39-82) 96/64 (03/21 1300) SpO2:  [96 %-100 %] 100 % (03/21 1300) Weight:  [100.9 kg-104 kg] 100.9 kg (03/21 1218) Last BM Date: 03/13/21  Intake/Output from previous day: 03/20 0701 - 03/21 0700 In: 1377 [P.O.:1377] Out: 1 [Stool:1] Intake/Output this shift: Total I/O In: -  Out: 3000 [Other:3000]  PE: Left Breast: Chaperone present, RN. Patient with left breast induration extending along the superior aspect of the breast around 10 o'clock to the 3 o'clock position (superior-medial to superior lateral) that appears stable. No fluctuance or drainage from this area drainage. On my exam there does not appear to be any erythema or warmth. Incision along medial aspect of the breast c/d/i with sutures in place. Wound at 5 o'clock position (inferior lateral) with healing wound and to small pinpoint areas of opening draining purulence. There appears to be some underlying fluctuance.   Lab Results:  Recent Labs    03/14/21 1027 03/15/21 0708  WBC 6.3 6.0  HGB 9.9* 10.3*  HCT 32.3* 33.7*  PLT 202 211   BMET Recent Labs    03/14/21 1027 03/15/21 0708  NA 128* 129*  K 4.1 4.5  CL 88* 88*  CO2 28 29  GLUCOSE 148* 145*  BUN 38* 46*  CREATININE 10.37* 12.54*  CALCIUM 10.4* 10.7*   PT/INR No results for input(s): LABPROT, INR in the last 72 hours. CMP     Component Value Date/Time   NA 129 (L) 03/15/2021 0708   K 4.5 03/15/2021 0708   CL 88 (L) 03/15/2021 0708   CO2 29 03/15/2021 0708   GLUCOSE 145 (H) 03/15/2021 0708   BUN 46 (H) 03/15/2021 0708    CREATININE 12.54 (H) 03/15/2021 0708   CALCIUM 10.7 (H) 03/15/2021 0708   PROT 6.0 (L) 01/23/2021 0426   ALBUMIN 3.1 (L) 03/15/2021 0708   AST 46 (H) 01/23/2021 0426   ALT 35 01/23/2021 0426   ALKPHOS 42 01/23/2021 0426   BILITOT 0.7 01/23/2021 0426   GFRNONAA 3 (L) 03/15/2021 0708   GFRAA 5 (L) 02/13/2019 0734   Lipase     Component Value Date/Time   LIPASE 33 07/17/2018 1145       Studies/Results: No results found.  Anti-infectives: Anti-infectives (From admission, onward)   Start     Dose/Rate Route Frequency Ordered Stop   02/27/21 1730  clindamycin (CLEOCIN) capsule 300 mg        300 mg Oral Every 8 hours 02/27/21 1631 03/03/21 0511   02/24/21 2200  amoxicillin-clavulanate (AUGMENTIN) 500-125 MG per tablet 500 mg  Status:  Discontinued        1 tablet Oral Daily at bedtime 02/23/21 1149 02/24/21 0817   02/24/21 1200  clindamycin (CLEOCIN) capsule 300 mg  Status:  Discontinued        300 mg Oral Every 6 hours 02/24/21 0817 02/24/21 0952   02/24/21 1200  clindamycin (CLEOCIN) IVPB 600 mg  Status:  Discontinued        600 mg 100 mL/hr over 30 Minutes Intravenous Every 6 hours 02/24/21 0952 02/27/21  1631   02/23/21 1245  amoxicillin-clavulanate (AUGMENTIN) 500-125 MG per tablet 500 mg        1 tablet Oral  Once 02/23/21 1149 02/23/21 1445   02/23/21 1215  amoxicillin-clavulanate (AUGMENTIN) 875-125 MG per tablet 1 tablet  Status:  Discontinued        1 tablet Oral Every 12 hours 02/23/21 1120 02/23/21 1149   01/23/21 1400  amoxicillin-clavulanate (AUGMENTIN) 500-125 MG per tablet 500 mg        500 mg Oral Every 24 hours 01/23/21 1124 01/25/21 1657   01/23/21 1015  amoxicillin-clavulanate (AUGMENTIN) 875-125 MG per tablet 1 tablet  Status:  Discontinued        1 tablet Oral Every 12 hours 01/23/21 1002 01/23/21 1124   01/20/21 0845  vancomycin (VANCOCIN) 1-5 GM/200ML-% IVPB       Note to Pharmacy: Judieth Keens  : cabinet override      01/20/21 0845 01/20/21 2059    01/18/21 1200  vancomycin (VANCOCIN) IVPB 1000 mg/200 mL premix  Status:  Discontinued        1,000 mg 200 mL/hr over 60 Minutes Intravenous Every M-W-F (Hemodialysis) 01/16/21 1017 01/21/21 1324   01/17/21 2200  Ampicillin-Sulbactam (UNASYN) 3 g in sodium chloride 0.9 % 100 mL IVPB  Status:  Discontinued        3 g 200 mL/hr over 30 Minutes Intravenous Daily at bedtime 01/16/21 1058 01/23/21 1002   01/17/21 1000  remdesivir 100 mg in sodium chloride 0.9 % 100 mL IVPB       "Followed by" Linked Group Details   100 mg 200 mL/hr over 30 Minutes Intravenous Daily 01/15/21 2357 01/18/21 0918   01/16/21 1145  Ampicillin-Sulbactam (UNASYN) 3 g in sodium chloride 0.9 % 100 mL IVPB        3 g 200 mL/hr over 30 Minutes Intravenous  Once 01/16/21 1058 01/16/21 1417   01/16/21 1100  vancomycin (VANCOREADY) IVPB 1500 mg/300 mL        1,500 mg 150 mL/hr over 120 Minutes Intravenous  Once 01/16/21 1017 01/16/21 1849   01/16/21 0300  remdesivir 200 mg in sodium chloride 0.9% 250 mL IVPB       "Followed by" Linked Group Details   200 mg 580 mL/hr over 30 Minutes Intravenous Once 01/15/21 2357 01/16/21 0543   01/15/21 2345  amoxicillin-clavulanate (AUGMENTIN) 875-125 MG per tablet 1 tablet  Status:  Discontinued        1 tablet Oral Every 12 hours 01/15/21 2334 01/15/21 2357   01/15/21 1800  vancomycin (VANCOCIN) IVPB 1000 mg/200 mL premix        1,000 mg 200 mL/hr over 60 Minutes Intravenous  Once 01/15/21 1753 01/15/21 2120       Assessment/Plan ESRD- M/W/F PAD Blind Chronic anemia Obesity BMI 40.76  RecurrentLeft breast abscess S/pIncision and drainage of left breast abscess with excisional debridement-01/15/21 -Dr. Zenia Resides S/p IR US guided aspiration - Dr. Pascal Lux- 02/24/21 - yielding 3cc of purulent appearing thick fluid. Cx's pending and NGTD.  S/pIncision and drainage of left breast wound, left breast biopsy- Dr. Zenia Resides- 02/25/21  -path was negative for malignancy1/21and 3/3,  although still some concern for possible inflammatory breast cancer.Willbenefit from [place] mammogram as outpatient - Patient prior wound now with 2 pinpoint areas of purulent drainage. She does have some underlying fluctuance. I discussed with patient about bedside I&D and will plan to proceed with this tomorrow. There is no overlying cellulitis. Feel we can hold off on abx currently.  LOS: 59 days    Jillyn Ledger , [facility] 03/15/2021, 3:38 PM Please see Amion for pager number during day hours 7:00am-4:30pm

## 2021-03-15 NOTE — Progress Notes (Signed)
[facility] PROGRESS NOTE  Karrie Bonn [id number] DOB: [birth year] DOA: 01/15/2021 PCP: Trey Sailors, PA  Status: Remains inpatient appropriate because:Unsafe d/c plan and Inpatient level of care appropriate due to severity of illness   Dispo: The patient is from: Homeless              Anticipated d/c is to: Group home ie family care home in [place].  Anticipate discharge date 3/25 after outpatient dialysis seat established.              Patient currently is medically stable to d/c.    Difficult to place patient Yes   Level of care: Med-Surg  Code Status: Full Family Communication:  DVT prophylaxis: SQ Heparin Vaccination status: Unknown-Incidentally Covid positive during this admission (01/15/2021)  Foley catheter: No  HPI:  45 y.o. female with PMH significant for ESRD on HD MWF, blindness, hypertension, PAD. Patient presented to the ED on 01/15/2021 with breast abscess, underwent I&D by general surgery.  She was initially treated with IV antibiotics later completed a course with oral antibiotics.  She was also found to be incidental Covid positive. Now she is off contact precautions. 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 IR and vascular were consulted. aVF is now working.Dialysis per nephrology.   Subjective: Alert.  Examined during dialysis.  Reports no drainage and increased pain from left breast wound.  Updated on plans to discharge to family care home once outpatient hemodialysis can be arranged in [facility].  Objective: Vitals:   03/14/21 2102 03/15/21 0451  BP: 114/63 137/81  Pulse: 67 71  Resp: 18 14  Temp: 98.3 F (36.8 C) 98.3 F (36.8 C)  SpO2: 96% 96%    Intake/Output Summary (Last 24 hours) at 03/15/2021 [id number] Last data filed at 03/15/2021 0600 Gross per 24 hour  Intake 1137 ml  Output 1 ml  Net 1136 ml   Filed  Weights   03/12/21 0715 03/12/21 1101 03/14/21 2102  Weight: 102.6 kg 99 kg 104 kg    Exam:  Constitutional: Alert, calm, mild distress as evidenced by increasing left breast pain with reemergence of drainage Breast: Left breast w/erythema and has some mild cloudy serous appearing drainage from wound.  Patient reports increased pain with minimal palpation  Respiratory: Lungs are clear and she is stable on room air Cardiac: Heart sounds S1-S2, no peripheral edema, pulse regular Abdomen: Soft, eating well.  Normoactive bowel sounds LBM 3/19 Neurologic: CN 2-12 grossly intact except for known blindness/visual impairment. Sensation intact, DTR normal. Strength 5/5 x all 4 extremities.  Ext: Prior bilateral transmetatarsal amputations Psychiatric: Alert and oriented x3 with normal affect.   Assessment/Plan: Acute problems: Breast abscess/recurrent -s/p incision drainage done by general surgery.-Pathology without any evidence of malignancy.  Treated initially with IV Unasyn given gram-positive cocci findings on cultures and subsequently transitioned to Augmentin which was completed -On 3/1 reemergent breast abscess.  General surgery consulted and patient was resumed on Augmentin -3/2 US guided aspiration of complex fluid collection yielded minimal fluid returns -3/3 underwent intraoperative surgical debridement as well as biopsy to rule out underlying malignancy-pathology negative for malignancy and cultures negative for growth; on 3/8 surgery cleared from a surgical standpoint for discharge. -Continue OxyIR 15 mg every 4 hours as needed plus Tylenol 325 mg every 4 hours as needed - continue IV morphine prn for breakthrough pain - continue Lyrica 75 mg daily as  adjunct for pain management significant improvement in pain.  Of note her primary insurance requires preauthorization for Lyrica but Medicaid will pay for this medication at $4 per prescription -3/21 wound draining again.  No associated  fevers.  Patient reporting inadequate pain control therefore have added ibuprofen 600 mg scheduled TID with daily Protonix 40 mg after confirming with nephrology okay to initiate NSAIDs and PPI on this patient.  Secure chat also sent to surgical team regarding no drainage.  Incidental COVID-19 -patient was asymptomatic, no evidence of pulmonary involvement.  -Off isolation.Currently on room air.  ESRD -nephrology was consulted, had issues with cannulation of aVF. IR and vascular was consulted and underwent fistulogram demonstrated pseudoaneurysm.  -AVF is now functional and being utilized for dialysis sessions -Dialysis per nephrology.  -Continue sevelamer.  Anemia of chronic disease -monitor intermittently, hemoglobin is stable at 9.1. Continue Aranesp.   Other problems: Hyponatremia -patient on hemodialysis, nephrology following. Sodium has remained stable between 128 and 134 mostly altered by hemodialysis     Hypertension -Blood pressure controlled on clonidine and Coreg.  Peripheral artery disease -hx of Fem-pop bypass. -Has history of left transmetatarsal amputation.    Data Reviewed: Basic Metabolic Panel: Recent Labs  Lab 03/10/21 0924 03/11/21 1202 03/12/21 0736 03/14/21 1027  NA 133* 134* 128* 128*  K 4.0 3.8 4.5 4.1  CL 98 95* 96* 88*  CO2 '23 30 23 28  '$ GLUCOSE 157* 128* 135* 148*  BUN 32* 20 31* 38*  CREATININE 11.07* 8.34* 10.08* 10.37*  CALCIUM 10.0 10.2 10.2 10.4*  PHOS 5.8* 4.5 5.2* 4.8*   Liver Function Tests: Recent Labs  Lab 03/10/21 0924 03/11/21 1202 03/12/21 0736 03/14/21 1027  ALBUMIN 3.0* 3.1* 3.0* 3.0*   No results for input(s): LIPASE, AMYLASE in the last 168 hours. No results for input(s): AMMONIA in the last 168 hours. CBC: Recent Labs  Lab 03/10/21 0115 03/11/21 1202 03/12/21 0736 03/14/21 1027 03/15/21 0708  WBC 5.9 5.1 5.4 6.3 6.0  HGB 10.5* 10.3* 10.2* 9.9* 10.3*  HCT 33.2* 33.7* 32.2* 32.3* 33.7*  MCV 99.4  100.3* 100.0 99.4 100.3*  PLT 185 183 198 202 211   Cardiac Enzymes: No results for input(s): CKTOTAL, CKMB, CKMBINDEX, TROPONINI in the last 168 hours. BNP (last 3 results) Recent Labs    01/18/21 0136 01/19/21 0030 01/20/21 0020  BNP 1,410.7* 1,122.4* 1,383.7*    ProBNP (last 3 results) No results for input(s): PROBNP in the last 8760 hours.  CBG: No results for input(s): GLUCAP in the last 168 hours.  Recent Results (from the past 240 hour(s))  SARS CORONAVIRUS 2 (TAT 6-24 HRS) Nasopharyngeal Nasopharyngeal Swab     Status: None   Collection Time: 03/10/21 10:29 AM   Specimen: Nasopharyngeal Swab  Result Value Ref Range Status   SARS Coronavirus 2 NEGATIVE NEGATIVE Final    Comment: (NOTE) SARS-CoV-2 target nucleic acids are NOT DETECTED.  The SARS-CoV-2 RNA is generally detectable in upper and lower respiratory specimens during the acute phase of infection. Negative results do not preclude SARS-CoV-2 infection, do not rule out co-infections with other pathogens, and should not be used as the sole basis for treatment or other patient management decisions. Negative results must be combined with clinical observations, patient history, and epidemiological information. The expected result is Negative.  Fact Sheet for Patients: SugarRoll.be  Fact Sheet for Healthcare Providers: https://www.woods-mathews.com/  This test is not yet approved or cleared by the [place] FDA and  has been authorized for detection and/or  diagnosis of SARS-CoV-2 by FDA under an Emergency Use Authorization (EUA). This EUA will remain  in effect (meaning this test can be used) for the duration of the COVID-19 declaration under Se ction 564(b)(1) of the Act, 21 U.S.C. section 360bbb-3(b)(1), unless the authorization is terminated or revoked sooner.  Performed at [facility], [place] [address], [place], [place] [address]      Studies: No  results found.  Scheduled Meds: . (feeding supplement) PROSource Plus  30 mL Oral BID BM  . carvedilol  25 mg Oral BID WC  . Chlorhexidine Gluconate Cloth  6 each Topical Q0600  . cloNIDine  0.3 mg Oral TID  . darbepoetin (ARANESP) injection - DIALYSIS  150 mcg Intravenous Q Mon-HD  . heparin  5,000 Units Subcutaneous Q8H  . multivitamin  1 tablet Oral QHS  . pregabalin  75 mg Oral QHS  . senna  1 tablet Oral BID  . sevelamer carbonate  3,200 mg Oral TID WC   Continuous Infusions: . sodium chloride    . sodium chloride 10 mL/hr at 02/27/21 0831  . sodium chloride    . sodium chloride      Active Problems:   ESRD (end stage renal disease) ([place])   Breast abscess   COVID-19 virus infection   Breast abscess of female   Left breast abscess   Poor social situation   Consultants:  Nephrology  General surgery  Interventional radiology  Procedures:  Aspiration of breast abscess per IR 3/1 with further image guided aspiration of complex breast abscess planned for 3/2  Antibiotics: Anti-infectives (From admission, onward)   Start     Dose/Rate Route Frequency Ordered Stop   02/27/21 1730  clindamycin (CLEOCIN) capsule 300 mg        300 mg Oral Every 8 hours 02/27/21 1631 03/03/21 0511   02/24/21 2200  amoxicillin-clavulanate (AUGMENTIN) 500-125 MG per tablet 500 mg  Status:  Discontinued        1 tablet Oral Daily at bedtime 02/23/21 1149 02/24/21 0817   02/24/21 1200  clindamycin (CLEOCIN) capsule 300 mg  Status:  Discontinued        300 mg Oral Every 6 hours 02/24/21 0817 02/24/21 0952   02/24/21 1200  clindamycin (CLEOCIN) IVPB 600 mg  Status:  Discontinued        600 mg 100 mL/hr over 30 Minutes Intravenous Every 6 hours 02/24/21 0952 02/27/21 1631   02/23/21 1245  amoxicillin-clavulanate (AUGMENTIN) 500-125 MG per tablet 500 mg        1 tablet Oral  Once 02/23/21 1149 02/23/21 1445   02/23/21 1215  amoxicillin-clavulanate (AUGMENTIN) 875-125 MG per tablet 1 tablet   Status:  Discontinued        1 tablet Oral Every 12 hours 02/23/21 1120 02/23/21 1149   01/23/21 1400  amoxicillin-clavulanate (AUGMENTIN) 500-125 MG per tablet 500 mg        500 mg Oral Every 24 hours 01/23/21 1124 01/25/21 1657   01/23/21 1015  amoxicillin-clavulanate (AUGMENTIN) 875-125 MG per tablet 1 tablet  Status:  Discontinued        1 tablet Oral Every 12 hours 01/23/21 1002 01/23/21 1124   01/20/21 0845  vancomycin (VANCOCIN) 1-5 GM/200ML-% IVPB       Note to Pharmacy: Judieth Keens  : cabinet override      01/20/21 0845 01/20/21 2059   01/18/21 1200  vancomycin (VANCOCIN) IVPB 1000 mg/200 mL premix  Status:  Discontinued  1,000 mg 200 mL/hr over 60 Minutes Intravenous Every M-W-F (Hemodialysis) 01/16/21 1017 01/21/21 1324   01/17/21 2200  Ampicillin-Sulbactam (UNASYN) 3 g in sodium chloride 0.9 % 100 mL IVPB  Status:  Discontinued        3 g 200 mL/hr over 30 Minutes Intravenous Daily at bedtime 01/16/21 1058 01/23/21 1002   01/17/21 1000  remdesivir 100 mg in sodium chloride 0.9 % 100 mL IVPB       "Followed by" Linked Group Details   100 mg 200 mL/hr over 30 Minutes Intravenous Daily 01/15/21 2357 01/18/21 0918   01/16/21 1145  Ampicillin-Sulbactam (UNASYN) 3 g in sodium chloride 0.9 % 100 mL IVPB        3 g 200 mL/hr over 30 Minutes Intravenous  Once 01/16/21 1058 01/16/21 1417   01/16/21 1100  vancomycin (VANCOREADY) IVPB 1500 mg/300 mL        1,500 mg 150 mL/hr over 120 Minutes Intravenous  Once 01/16/21 1017 01/16/21 1849   01/16/21 0300  remdesivir 200 mg in sodium chloride 0.9% 250 mL IVPB       "Followed by" Linked Group Details   200 mg 580 mL/hr over 30 Minutes Intravenous Once 01/15/21 2357 01/16/21 0543   01/15/21 2345  amoxicillin-clavulanate (AUGMENTIN) 875-125 MG per tablet 1 tablet  Status:  Discontinued        1 tablet Oral Every 12 hours 01/15/21 2334 01/15/21 2357   01/15/21 1800  vancomycin (VANCOCIN) IVPB 1000 mg/200 mL premix        1,000  mg 200 mL/hr over 60 Minutes Intravenous  Once 01/15/21 1753 01/15/21 2120       Time spent: 20 minutes    Erin Hearing ANP  [facility] 7 am - 330 pm/M-F for direct patient care and secure chat Please refer to Amion for contact info 59  days

## 2021-03-15 NOTE — Progress Notes (Addendum)
2:30pm: CSW attempted to reach staff at [place], Disability, and [facility] to arrange transportation for patient - no answer, a voicemail was left requesting a return call.  7:45am: CSW was notified by Castalia Nation of [place] that she successfully met with the patient to discuss [facility] placement and that the patient is agreeable and wants to accept the bed offer. The patient will need to switch her HD clinic and transportation provider as the home is located in [place]. The home is located at [address] [place], [place] [address].  CSW updated Jaclyn Shaggy, [place] / Renal Navigator of information to assist with HD clinic change.  Madilyn Fireman, MSW, LCSW Transitions of Care  Clinical Social Worker II [phone]

## 2021-03-16 LAB — HEPATITIS B SURFACE ANTIBODY, QUANTITATIVE: Hep B S AB Quant (Post): 7 m[IU]/mL — ABNORMAL LOW

## 2021-03-16 NOTE — Progress Notes (Signed)
Patient has been accepted for outpatient HD at [facility] on a TTS schedule with a seat time of 11:00am. She needs to arrive to her appointments at 10:45am.  Navigator was updated by DTP team that she is not medically ready for discharge. Navigator has asked Renal team to leave patient on HD schedule here for tomorrow/Wednesday in hopes that she be ready for discharge Thursday or Friday (on a non-HD day in this case) and can start in her clinic on Saturday.  Navigator spoke with [employer] clinic/Joy, who states they can accommodate a new patient on Saturday and that she has left a message with [facility] Lucianne Lei driver to request that patient be put on the schedule for pick up/drop off starting Saturday. We will need confirmation of transportation before moving forward with this plan. Joy states she expects to hear back from driver tomorrow.  Navigator will continue to follow closely.   Alphonzo Cruise, [place] Renal Navigator [phone]

## 2021-03-16 NOTE — Progress Notes (Signed)
[facility] PROGRESS NOTE  Rebekah Burton [id number] DOB: [birth year] DOA: 01/15/2021 PCP: Trey Sailors, PA  Status: Remains inpatient appropriate because:Unsafe d/c plan and Inpatient level of care appropriate due to severity of illness   Dispo: The patient is from: Homeless              Anticipated d/c is to: Group home ie family care home in [place].  Anticipate discharge date 3/25 after outpatient dialysis seat established.              Patient currently is not medically stable to d/c.  Patient has had a third recurrence of breast swelling and pain with drainage and has required a bedside I&D as of 3/22.  At a minimum patient will need home health services with RN for wound care after discharge given she will discharge to a family care home and caretakers in the home would likely not be eligible to provide this type of care to the patient.  When she does have probably hydrated all of her prescriptions to present hydromorphone from nursing home but none her study at that trimethadione any of her staff yet   Difficult to place patient Yes   Level of care: Med-Surg  Code Status: Full Family Communication:  DVT prophylaxis: SQ Heparin Vaccination status: Unknown-Incidentally Covid positive during this admission (01/15/2021)  Foley catheter: No  HPI:  45 y.o. female with PMH significant for ESRD on HD MWF, blindness, hypertension, PAD. Patient presented to the ED on 01/15/2021 with breast abscess, underwent I&D by general surgery.  She was initially treated with IV antibiotics later completed a course with oral antibiotics.  She was also found to be incidental Covid positive. Now she is off contact precautions. 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 IR and vascular were consulted. aVF is now working.Dialysis per nephrology.   Subjective: Awake sitting on side  in bed.  States pain in breast slightly better after the initiation of ibuprofen but continues to report significant pain and reports drainage.  Objective: Vitals:   03/15/21 2022 03/16/21 0505  BP: (!) 104/57 103/61  Pulse: 77 70  Resp: 16 17  Temp: 98.5 F (36.9 C) 97.7 F (36.5 C)  SpO2: 99% 99%    Intake/Output Summary (Last 24 hours) at 03/16/2021 0844 Last data filed at 03/15/2021 2200 Gross per 24 hour  Intake 780 ml  Output 3000 ml  Net -2220 ml   Filed Weights   03/14/21 2102 03/15/21 0837 03/15/21 1218  Weight: 104 kg 103.9 kg 100.9 kg    Exam:  Constitutional: Alert, mild distress as evidenced by ongoing and recurrent left breast pain Breast: Left breast w/ cloudy serous appearing drainage from wound.  Noted with increased induration and breast is exquisitely tender even superior on upper chest with placement of stethoscope Respiratory: Lungs are clear, room air Cardiac: Normal heart sounds, no resting tachycardia, no peripheral edema Abdomen:  LBM 3/19, soft and nontender with bowel sounds present.  Eating well Neurologic: CN 2-12 grossly intact except for known blindness/visual impairment. Sensation intact, DTR normal. Strength 5/5 x all 4 extremities.  Ext: Prior bilateral transmetatarsal amputations Psychiatric: Alert and oriented.  Appropriate affect   Assessment/Plan: Acute problems: Breast abscess/recurrent -s/p incision drainage done by general surgery.-Pathology without any evidence of malignancy.  Treated initially with IV Unasyn given gram-positive cocci findings on cultures and subsequently transitioned to Augmentin which was completed -  On 3/1 reemergent breast abscess.  General surgery consulted and patient was resumed on Augmentin -3/2 US guided aspiration of complex fluid collection yielded minimal fluid returns -3/3 underwent intraoperative surgical debridement as well as biopsy to rule out underlying malignancy-pathology negative for malignancy  and cultures negative for growth; on 3/8 surgery cleared from a surgical standpoint for discharge. -Continue OxyIR 15 mg every 4 hours as needed plus Tylenol 325 mg every 4 hours as needed - continue IV morphine prn for breakthrough pain - continue Lyrica 75 mg daily as adjunct for pain management significant improvement in pain.  Of note her primary insurance requires preauthorization for Lyrica but Medicaid will pay for this medication at $4 per prescription -3/21 wound draining again.  No associated fevers.  Patient reporting inadequate pain control therefore have added ibuprofen 600 mg scheduled TID with daily Protonix 40 mg after confirming with nephrology okay to initiate NSAIDs and PPI on this patient.  Subsequently patient was evaluated by surgical team who confirmed changes and plan bedside I&D on 3/22.  Per their report no indication to initiate antibiotics at this time -3/22 patient underwent bedside I&D of recurrent breast abscess.  Please see surgery note.  Wound was packed to achieve hemostasis.  Plan is to keep patient in the hospital until cleared once again by surgery.  Note patient will be discharged to a family care home and would need to have home health nursing follow-up given that it is unlikely that caretakers in the home would be able to manage patient's wound care.  Incidental COVID-19 -patient was asymptomatic, no evidence of pulmonary involvement.  -Off isolation.Currently on room air.  ESRD -nephrology was consulted, had issues with cannulation of aVF. IR and vascular was consulted and underwent fistulogram demonstrated pseudoaneurysm.  -AVF is now functional and being utilized for dialysis sessions -Dialysis per nephrology.  -Continue sevelamer.  Anemia of chronic disease -monitor intermittently, hemoglobin is stable at 9.1. Continue Aranesp.   Other problems: Hyponatremia -patient on hemodialysis, nephrology following. Sodium has remained stable between  128 and 134 mostly altered by hemodialysis     Hypertension -Blood pressure controlled on clonidine and Coreg.  Peripheral artery disease -hx of Fem-pop bypass. -Has history of left transmetatarsal amputation.    Data Reviewed: Basic Metabolic Panel: Recent Labs  Lab 03/10/21 0924 03/11/21 1202 03/12/21 0736 03/14/21 1027 03/15/21 0708  NA 133* 134* 128* 128* 129*  K 4.0 3.8 4.5 4.1 4.5  CL 98 95* 96* 88* 88*  CO2 '23 30 23 28 29  '$ GLUCOSE 157* 128* 135* 148* 145*  BUN 32* 20 31* 38* 46*  CREATININE 11.07* 8.34* 10.08* 10.37* 12.54*  CALCIUM 10.0 10.2 10.2 10.4* 10.7*  PHOS 5.8* 4.5 5.2* 4.8* 5.5*   Liver Function Tests: Recent Labs  Lab 03/10/21 0924 03/11/21 1202 03/12/21 0736 03/14/21 1027 03/15/21 0708  ALBUMIN 3.0* 3.1* 3.0* 3.0* 3.1*   No results for input(s): LIPASE, AMYLASE in the last 168 hours. No results for input(s): AMMONIA in the last 168 hours. CBC: Recent Labs  Lab 03/10/21 0115 03/11/21 1202 03/12/21 0736 03/14/21 1027 03/15/21 0708  WBC 5.9 5.1 5.4 6.3 6.0  HGB 10.5* 10.3* 10.2* 9.9* 10.3*  HCT 33.2* 33.7* 32.2* 32.3* 33.7*  MCV 99.4 100.3* 100.0 99.4 100.3*  PLT 185 183 198 202 211   Cardiac Enzymes: No results for input(s): CKTOTAL, CKMB, CKMBINDEX, TROPONINI in the last 168 hours. BNP (last 3 results) Recent Labs    01/18/21 0136 01/19/21 0030 01/20/21 0020  BNP 1,410.7* 1,122.4* 1,383.7*    ProBNP (last 3 results) No results for input(s): PROBNP in the last 8760 hours.  CBG: No results for input(s): GLUCAP in the last 168 hours.  Recent Results (from the past 240 hour(s))  SARS CORONAVIRUS 2 (TAT 6-24 HRS) Nasopharyngeal Nasopharyngeal Swab     Status: None   Collection Time: 03/10/21 10:29 AM   Specimen: Nasopharyngeal Swab  Result Value Ref Range Status   SARS Coronavirus 2 NEGATIVE NEGATIVE Final    Comment: (NOTE) SARS-CoV-2 target nucleic acids are NOT DETECTED.  The SARS-CoV-2 RNA is generally detectable in  upper and lower respiratory specimens during the acute phase of infection. Negative results do not preclude SARS-CoV-2 infection, do not rule out co-infections with other pathogens, and should not be used as the sole basis for treatment or other patient management decisions. Negative results must be combined with clinical observations, patient history, and epidemiological information. The expected result is Negative.  Fact Sheet for Patients: SugarRoll.be  Fact Sheet for Healthcare Providers: https://www.woods-mathews.com/  This test is not yet approved or cleared by the [place] FDA and  has been authorized for detection and/or diagnosis of SARS-CoV-2 by FDA under an Emergency Use Authorization (EUA). This EUA will remain  in effect (meaning this test can be used) for the duration of the COVID-19 declaration under Se ction 564(b)(1) of the Act, 21 U.S.C. section 360bbb-3(b)(1), unless the authorization is terminated or revoked sooner.  Performed at [facility], [place] [address] [place], [place] [address]      Studies: No results found.  Scheduled Meds: . (feeding supplement) PROSource Plus  30 mL Oral BID BM  . carvedilol  25 mg Oral BID WC  . Chlorhexidine Gluconate Cloth  6 each Topical Q0600  . cloNIDine  0.3 mg Oral TID  . darbepoetin (ARANESP) injection - DIALYSIS  150 mcg Intravenous Q Mon-HD  . heparin  5,000 Units Subcutaneous Q8H  . ibuprofen  600 mg Oral TID  . lidocaine (PF)  10 mL Infiltration Once  . multivitamin  1 tablet Oral QHS  . pantoprazole  40 mg Oral Daily  . pregabalin  75 mg Oral QHS  . senna  1 tablet Oral BID  . sevelamer carbonate  3,200 mg Oral TID WC   Continuous Infusions: . sodium chloride    . sodium chloride 10 mL/hr at 02/27/21 [id number]    Active Problems:   ESRD (end stage renal disease) ([place])   Breast abscess   COVID-19 virus infection   Breast abscess of female   Left breast  abscess   Poor social situation   Complication of vascular access for dialysis   Consultants:  Nephrology  General surgery  Interventional radiology  Procedures:  Aspiration of breast abscess per IR 3/1 with further image guided aspiration of complex breast abscess planned for 3/2  Antibiotics: Anti-infectives (From admission, onward)   Start     Dose/Rate Route Frequency Ordered Stop   02/27/21 1730  clindamycin (CLEOCIN) capsule 300 mg        300 mg Oral Every 8 hours 02/27/21 1631 03/03/21 0511   02/24/21 2200  amoxicillin-clavulanate (AUGMENTIN) 500-125 MG per tablet 500 mg  Status:  Discontinued        1 tablet Oral Daily at bedtime 02/23/21 1149 02/24/21 0817   02/24/21 1200  clindamycin (CLEOCIN) capsule 300 mg  Status:  Discontinued        300 mg Oral Every 6 hours 02/24/21 0817 02/24/21  [id number]   02/24/21 1200  clindamycin (CLEOCIN) IVPB 600 mg  Status:  Discontinued        600 mg 100 mL/hr over 30 Minutes Intravenous Every 6 hours 02/24/21 0952 02/27/21 1631   02/23/21 1245  amoxicillin-clavulanate (AUGMENTIN) 500-125 MG per tablet 500 mg        1 tablet Oral  Once 02/23/21 1149 02/23/21 1445   02/23/21 1215  amoxicillin-clavulanate (AUGMENTIN) 875-125 MG per tablet 1 tablet  Status:  Discontinued        1 tablet Oral Every 12 hours 02/23/21 1120 02/23/21 1149   01/23/21 1400  amoxicillin-clavulanate (AUGMENTIN) 500-125 MG per tablet 500 mg        500 mg Oral Every 24 hours 01/23/21 1124 01/25/21 1657   01/23/21 1015  amoxicillin-clavulanate (AUGMENTIN) 875-125 MG per tablet 1 tablet  Status:  Discontinued        1 tablet Oral Every 12 hours 01/23/21 1002 01/23/21 1124   01/20/21 0845  vancomycin (VANCOCIN) 1-5 GM/200ML-% IVPB       Note to Pharmacy: Judieth Keens  : cabinet override      01/20/21 0845 01/20/21 2059   01/18/21 1200  vancomycin (VANCOCIN) IVPB 1000 mg/200 mL premix  Status:  Discontinued        1,000 mg 200 mL/hr over 60 Minutes Intravenous Every  M-W-F (Hemodialysis) 01/16/21 1017 01/21/21 1324   01/17/21 2200  Ampicillin-Sulbactam (UNASYN) 3 g in sodium chloride 0.9 % 100 mL IVPB  Status:  Discontinued        3 g 200 mL/hr over 30 Minutes Intravenous Daily at bedtime 01/16/21 1058 01/23/21 1002   01/17/21 1000  remdesivir 100 mg in sodium chloride 0.9 % 100 mL IVPB       "Followed by" Linked Group Details   100 mg 200 mL/hr over 30 Minutes Intravenous Daily 01/15/21 2357 01/18/21 0918   01/16/21 1145  Ampicillin-Sulbactam (UNASYN) 3 g in sodium chloride 0.9 % 100 mL IVPB        3 g 200 mL/hr over 30 Minutes Intravenous  Once 01/16/21 1058 01/16/21 1417   01/16/21 1100  vancomycin (VANCOREADY) IVPB 1500 mg/300 mL        1,500 mg 150 mL/hr over 120 Minutes Intravenous  Once 01/16/21 1017 01/16/21 1849   01/16/21 0300  remdesivir 200 mg in sodium chloride 0.9% 250 mL IVPB       "Followed by" Linked Group Details   200 mg 580 mL/hr over 30 Minutes Intravenous Once 01/15/21 2357 01/16/21 0543   01/15/21 2345  amoxicillin-clavulanate (AUGMENTIN) 875-125 MG per tablet 1 tablet  Status:  Discontinued        1 tablet Oral Every 12 hours 01/15/21 2334 01/15/21 2357   01/15/21 1800  vancomycin (VANCOCIN) IVPB 1000 mg/200 mL premix        1,000 mg 200 mL/hr over 60 Minutes Intravenous  Once 01/15/21 1753 01/15/21 2120       Time spent: 20 minutes    Erin Hearing ANP  [facility] 7 am - 330 pm/M-F for direct patient care and secure chat Please refer to Amion for contact info 60  days

## 2021-03-16 NOTE — Progress Notes (Signed)
Physical Therapy Treatment Patient Details Name: Rebekah Burton MRN: [id number] DOB: Apr 28, 1976 Today's Date: 03/16/2021    History of Present Illness 45 y.o. female with developed a breast abscess and was seen in ED 01/04/21 where U/S confirmed abscess. She was to have been seen as outpatient for aspiration/drainage but did not get to make that appt. In HD the breast abscess opened and drained spontaneously. Because of continued drainage she presents to [facility] for evaluation. Pt admited on 01/15/21 and found to be COVID+ taken to OR for further exploration, culture, and excisional debridement 01/15/21. Medical history significant of ESRD-HD M-W-F, blindness, HTN, PAD    PT Comments    Pt received in bed, cooperative and pleasant. Today's session focused on higher level balance activities and LE strength in pain-free range of motion. Pt demonstrated increased sway with the addition of horizontal head turns during dynamic activity and static standing. Had 1 LOB during ambulation when turning to R but was able to self correct. Pt more steady with vertical head turns but reported that she has to concentrate more so she does not lose her balance. Pt left in bed with all needs met and call bell within reach. Will continue to follow acutely.    Follow Up Recommendations  Supervision for mobility/OOB;SNF     Equipment Recommendations  None recommended by PT    Recommendations for Other Services       Precautions / Restrictions Precautions Precautions: Other (comment) Precaution Comments: pt is blind, needs assist for navigation, L breast abscess Restrictions Weight Bearing Restrictions: No    Mobility  Bed Mobility Overal bed mobility: Modified Independent Bed Mobility: Supine to Sit                Transfers Overall transfer level: Modified independent Equipment used: None Transfers: Sit to/from Stand Sit to Stand: Modified independent (Device/Increase time)             Ambulation/Gait Ambulation/Gait assistance: Min guard Gait Distance (Feet): 300 Feet (250 total, 60 backwards walking, 120 backwards walking with head turns, 70 forwards) Assistive device: None Gait Pattern/deviations: Step-through pattern;Decreased step length - right;Decreased step length - left;Drifts right/left Gait velocity: decreased   General Gait Details: Cues for navigation, increased sway with head turns to right, 1 LOB when turning head to R that pt was able to self correct   Stairs             Wheelchair Mobility    Modified Rankin (Stroke Patients Only)       Balance Overall balance assessment: Needs assistance Sitting-balance support: No upper extremity supported;Feet supported Sitting balance-Leahy Scale: Normal     Standing balance support: During functional activity;No upper extremity supported Standing balance-Leahy Scale: Good Standing balance comment: Steady in static standing and foward walking                            Cognition Arousal/Alertness: Awake/alert Behavior During Therapy: WFL for tasks assessed/performed Overall Cognitive Status: Within Functional Limits for tasks assessed                                 General Comments: cooperative      Exercises Other Exercises Other Exercises: Mini squats x10 Other Exercises: Static balance: feet together with horizontal headturns 2x30", feet together with vertical head turns 2x30"    General Comments  Pertinent Vitals/Pain Pain Assessment: Faces Faces Pain Scale: Hurts a little bit Pain Location: L breast abscess Pain Descriptors / Indicators: Discomfort;Aching Pain Intervention(s): Monitored during session    Home Living                      Prior Function            PT Goals (current goals can now be found in the care plan section) Acute Rehab PT Goals Patient Stated Goal: have less pain PT Goal Formulation: With  patient Potential to Achieve Goals: Good Additional Goals Additional Goal #1: Pt will be able to navigate her new home environment with supervision Additional Goal #2: Will score at least 18 on DGI    Frequency    Min 2X/week      PT Plan Current plan remains appropriate    Co-evaluation              AM-PAC PT "6 Clicks" Mobility   Outcome Measure  Help needed turning from your back to your side while in a flat bed without using bedrails?: None Help needed moving from lying on your back to sitting on the side of a flat bed without using bedrails?: None Help needed moving to and from a bed to a chair (including a wheelchair)?: None Help needed standing up from a chair using your arms (e.g., wheelchair or bedside chair)?: None Help needed to walk in hospital room?: A Little Help needed climbing 3-5 steps with a railing? : A Lot 6 Click Score: 21    End of Session Equipment Utilized During Treatment: Gait belt Activity Tolerance: Patient tolerated treatment well Patient left: with call bell/phone within reach;in bed (sitting at EOB per her request) Nurse Communication: Mobility status PT Visit Diagnosis: Other abnormalities of gait and mobility (R26.89);Pain Pain - part of body:  (L breast and UE)     Time:  -     Charges:                        Rosita Kea, SPT

## 2021-03-16 NOTE — Procedures (Signed)
Incision and Drainage Procedure Note  Pre-operative Diagnosis: Recurrent left breast abscess  Post-operative Diagnosis: same  Indications: drainage from previous site of I&D for left breast abscess with increased tenderness  Anesthesia: 1% plain lidocaine  Procedure Details  The procedure, risks and complications have been discussed in detail (including, but not limited to airway compromise, infection, bleeding) with the patient, and the patient has signed consent to the procedure.  The skin was sterilely prepped and draped over the affected area in the usual fashion. After adequate local anesthesia, I&D with a #11 blade was performed on the left inferior breast. 1.5 elliptical incision made and wound probed with hemostat. Purulent drainage: absent The patient was observed until stable.  Findings: Bloody drainage with 3 cm tract.  EBL: <5 cc's  Drains: None   Condition: Tolerated procedure well, Stable and wound packed with dry 2x2 gauze for hemostasis and covered with dry dressing.   Complications: none.  Norm Parcel, [facility] Surgery 03/16/2021, 10:12 AM Please see Amion for pager number during day hours 7:00am-4:30pm

## 2021-03-16 NOTE — Plan of Care (Signed)
  Problem: Pain Managment: Goal: General experience of comfort will improve Outcome: Progressing   Problem: Fluid Volume: Goal: Compliance with measures to maintain balanced fluid volume will improve Outcome: Progressing   

## 2021-03-16 NOTE — Progress Notes (Signed)
Subjective: Standing up at bathroom sink, no complaints currently said tolerated dialysis yesterday 3 .4 L UF, noted today for bedside breast I&D per [facility]  Objective Vital signs in last 24 hours: Vitals:   03/15/21 1300 03/15/21 1800 03/15/21 2022 03/16/21 0505  BP: 96/64 100/70 (!) 104/57 103/61  Pulse: 75 76 77 70  Resp: '16 16 16 17  '$ Temp: (!) 97.3 F (36.3 C) (!) 97.5 F (36.4 C) 98.5 F (36.9 C) 97.7 F (36.5 C)  TempSrc:      SpO2: 100% 100% 99% 99%  Weight:      Height:       Weight change: -0.1 kg  Physical Exam: General: Alert obese female  NAD Heart: RRR, no MRG Lungs: CTA nonlabored breathing  Abdomen: Obese bowel sounds normoactive, soft ND NT Extremities: No pedal edema Dialysis Access: Left arm AV fistula positive bruit  Dialysis Orders: [place] 4h 59mn 2/2.5 bath 105kg AVF  Heparin 7000 units IV initial bolus Heparin 3000 units IV mid run.  parsabiv 15 mgIVthree times a week with HD mircera 150 mcgIVevery 2 weeks - last given 01/13/21 hectorol 3 mcgIVthree times a week  Problem/Plan: 1.. ESRD: HD MWF.  HD on schedule  2. Left breast abscess -per [facility] and admit team s/p I&D abscess 01/15/2021 and 02/25/21 path neg.for malignancy on 01/21and 02/26/2019 .Completed antibiotic course. Noted [facility] plan for bedside I&D today 2.HD vascular access:  Had recurrentcannulationissuesw/AVF.IR completed f'gram 01/25/21  Sig. pseudoaneurysm s/p thrombin injection and near complete thrombosis of pseudoaneurysm per IR; issues now resolvedLow dose heparin resumed. 3.Covid 19 on admission. Now out of isolation window.  6. Secondary hyperparathyroidism - Phosat goal,on Renvela binder. Corr Ca elevated Use 2.0 Ca bath with HD.  No vitamin D 7. Anemia. --HGB 10.3.  Aranesp q. weekly Monday 150 8. HTN/volume -BP controlled, edema resolved.  Will be below EDW lower at d/c.  Some hyponatremia past week today back off initial 5 L order with low blood  pressure attempt 2 and half to 3 9. Nutrition -Albumin 3.1. Renal diet .  On Prosource supplement  DErnest Haber PA-C [facility] [phone]/22/2022,9:47 AM  LOS: 60 days   Labs: Basic Metabolic Panel: Recent Labs  Lab 03/12/21 0736 03/14/21 1027 03/15/21 0708  NA 128* 128* 129*  K 4.5 4.1 4.5  CL 96* 88* 88*  CO2 '23 28 29  '$ GLUCOSE 135* 148* 145*  BUN 31* 38* 46*  CREATININE 10.08* 10.37* 12.54*  CALCIUM 10.2 10.4* 10.7*  PHOS 5.2* 4.8* 5.5*   Liver Function Tests: Recent Labs  Lab 03/12/21 0736 03/14/21 1027 03/15/21 0708  ALBUMIN 3.0* 3.0* 3.1*   No results for input(s): LIPASE, AMYLASE in the last 168 hours. No results for input(s): AMMONIA in the last 168 hours. CBC: Recent Labs  Lab 03/10/21 0115 03/11/21 1202 03/12/21 0736 03/14/21 1027 03/15/21 0708  WBC 5.9 5.1 5.4 6.3 6.0  HGB 10.5* 10.3* 10.2* 9.9* 10.3*  HCT 33.2* 33.7* 32.2* 32.3* 33.7*  MCV 99.4 100.3* 100.0 99.4 100.3*  PLT 185 183 198 202 211   Cardiac Enzymes: No results for input(s): CKTOTAL, CKMB, CKMBINDEX, TROPONINI in the last 168 hours. CBG: No results for input(s): GLUCAP in the last 168 hours.  Studies/Results: No results found. Medications: . sodium chloride    . sodium chloride 10 mL/hr at 02/27/21 0831   . (feeding supplement) PROSource Plus  30 mL Oral BID BM  . carvedilol  25 mg Oral BID WC  .  Chlorhexidine Gluconate Cloth  6 each Topical Q0600  . cloNIDine  0.3 mg Oral TID  . darbepoetin (ARANESP) injection - DIALYSIS  150 mcg Intravenous Q Mon-HD  . heparin  5,000 Units Subcutaneous Q8H  . ibuprofen  600 mg Oral TID  . multivitamin  1 tablet Oral QHS  . pantoprazole  40 mg Oral Daily  . pregabalin  75 mg Oral QHS  . senna  1 tablet Oral BID  . sevelamer carbonate  3,200 mg Oral TID WC

## 2021-03-16 NOTE — Progress Notes (Signed)
Renal Navigator faxed HepB panel to [facility] and will follow up regarding referral for transfer to [facility].  Alphonzo Cruise, [place] Renal Navigator [phone]

## 2021-03-16 NOTE — TOC Progression Note (Signed)
Transition of Care [facility]) - Progression Note    Patient Details  Name: Rebekah Burton MRN: [id number] Date of Birth: [birth year]  Transition of Care [facility]) CM/SW [place], RN Phone Number: 03/16/2021, 2:07 PM  Clinical Narrative:    Case management noted that surgery met with the patient at the bedside for repeat I&D of left breast and iodoform packing and gauze dressing.  I spoke with Erin Hearing, NP and patient may have home health RN follow up at the [facility] in [place] regarding wound care.    Case management reached out to numerous home health agencies including [facility], [facility], [facility], [facility], [place], [facility], and [facility] - waiting to hear back regarding acceptance / denial for services.  CM and MSW will continue to follow for discharge needs.   Expected Discharge Plan: Group Home Barriers to Discharge: Continued Medical Work up  Expected Discharge Plan and Services Expected Discharge Plan: Group Home In-house Referral: Clinical Social Work Discharge Planning Services: CM Consult Post Acute Care Choice: Dialysis Living arrangements for the past 2 months: Single Family Home                 DME Arranged: N/A DME Agency: NA       HH Arranged: RN [place] Agency: [facility] Date HH Agency Contacted: 01/21/21 Time [place]: [id number] Representative spoke with at [place]: [place]) Interventions    Readmission Risk Interventions Readmission Risk Prevention Plan 03/16/2021  Transportation Screening Complete  PCP or Specialist Appt within 5-7 Days Complete  Home Care Screening Complete  Medication Review (RN CM) Complete

## 2021-03-16 NOTE — Progress Notes (Signed)
Renal Navigator called [place] until an Admissions Coordinator answered. Navigator was updated that all records have been received and are under review at the clinic. AC thinks Navigator should receive a call later this afternoon with an answer on whether or not patient will be accepted and chair time provided.   Alphonzo Cruise, [place] Renal Navigator [phone]

## 2021-03-17 ENCOUNTER — Other Ambulatory Visit: Payer: Self-pay | Admitting: Nurse Practitioner

## 2021-03-17 DIAGNOSIS — T829XXD Unspecified complication of cardiac and vascular prosthetic device, implant and graft, subsequent encounter: Secondary | ICD-10-CM

## 2021-03-17 LAB — CBC
HCT: 33.2 % — ABNORMAL LOW (ref 36.0–46.0)
Hemoglobin: 10.3 g/dL — ABNORMAL LOW (ref 12.0–15.0)
MCH: 30.7 pg (ref 26.0–34.0)
MCHC: 31 g/dL (ref 30.0–36.0)
MCV: 99.1 fL (ref 80.0–100.0)
Platelets: 196 10*3/uL (ref 150–400)
RBC: 3.35 MIL/uL — ABNORMAL LOW (ref 3.87–5.11)
RDW: 17.4 % — ABNORMAL HIGH (ref 11.5–15.5)
WBC: 5.4 10*3/uL (ref 4.0–10.5)
nRBC: 0 % (ref 0.0–0.2)

## 2021-03-17 LAB — RENAL FUNCTION PANEL
Albumin: 3 g/dL — ABNORMAL LOW (ref 3.5–5.0)
Anion gap: 11 (ref 5–15)
BUN: 41 mg/dL — ABNORMAL HIGH (ref 6–20)
CO2: 27 mmol/L (ref 22–32)
Calcium: 10.3 mg/dL (ref 8.9–10.3)
Chloride: 93 mmol/L — ABNORMAL LOW (ref 98–111)
Creatinine, Ser: 12.18 mg/dL — ABNORMAL HIGH (ref 0.44–1.00)
GFR, Estimated: 4 mL/min — ABNORMAL LOW (ref >60)
Glucose, Bld: 140 mg/dL — ABNORMAL HIGH (ref 70–99)
Phosphorus: 5.5 mg/dL — ABNORMAL HIGH (ref 2.5–4.6)
Potassium: 4.7 mmol/L (ref 3.5–5.1)
Sodium: 131 mmol/L — ABNORMAL LOW (ref 135–145)

## 2021-03-17 MED ORDER — PREGABALIN 75 MG PO CAPS: 75.0000 mg | ORAL_CAPSULE | Freq: Every day | ORAL | 3 refills | Status: DC

## 2021-03-17 MED ORDER — OXYCODONE HCL 10 MG PO TABS: 10.0000 mg | ORAL_TABLET | ORAL | 0 refills | Status: DC | PRN

## 2021-03-17 MED ORDER — PANTOPRAZOLE SODIUM 40 MG PO TBEC: 40.0000 mg | DELAYED_RELEASE_TABLET | Freq: Every day | ORAL | 3 refills | Status: AC

## 2021-03-17 MED ORDER — CLONIDINE HCL 0.3 MG PO TABS: 0.3000 mg | ORAL_TABLET | Freq: Three times a day (TID) | ORAL | 3 refills | Status: AC

## 2021-03-17 MED ORDER — NEPHRO-VITE 0.8 MG PO TABS: 1.0000 | ORAL_TABLET | ORAL | 3 refills | Status: AC

## 2021-03-17 MED ORDER — RENVELA 800 MG PO TABS: 3200.0000 mg | ORAL_TABLET | Freq: Three times a day (TID) | ORAL | 3 refills | Status: DC

## 2021-03-17 MED ORDER — NEPRO/CARBSTEADY PO LIQD
237.0000 mL | Freq: Two times a day (BID) | ORAL | Status: DC
  Administered 2021-03-18: 237 mL via ORAL

## 2021-03-17 MED ORDER — SENNA 8.6 MG PO TABS: 1.0000 | ORAL_TABLET | Freq: Two times a day (BID) | ORAL | 0 refills | Status: DC

## 2021-03-17 MED ORDER — IBUPROFEN 600 MG PO TABS: 600.0000 mg | ORAL_TABLET | Freq: Three times a day (TID) | ORAL | 0 refills | Status: DC

## 2021-03-17 MED ORDER — CARVEDILOL 25 MG PO TABS: 25.0000 mg | ORAL_TABLET | Freq: Two times a day (BID) | ORAL | 3 refills | Status: AC

## 2021-03-17 MED FILL — CARVEDILOL 25 MG TABLET: 25 | 30 days supply | Qty: 60 | Fill #0

## 2021-03-17 MED FILL — B COMPLEX TABLET: 10 days supply | Qty: 30 | Fill #0

## 2021-03-17 MED FILL — oxyCODONE HCL 10 MG TABS: 10 | 5 days supply | Qty: 30 | Fill #0

## 2021-03-17 MED FILL — cloNIDine HCL 0.3 MG TABS: 0.3 | 30 days supply | Qty: 90 | Fill #0

## 2021-03-17 MED FILL — PANTOPRAZOLE SOD DR 40 MG T: 40 | 30 days supply | Qty: 30 | Fill #0

## 2021-03-17 MED FILL — PREGABALIN 75 MG CAPS: 75 | 30 days supply | Qty: 30 | Fill #0

## 2021-03-17 MED FILL — SENNA 8.6 MG TABS: 8.6 | 60 days supply | Qty: 120 | Fill #0

## 2021-03-17 MED FILL — IBUPROFEN 600 MG TABLET: 600 | 10 days supply | Qty: 30 | Fill #0

## 2021-03-17 NOTE — TOC Progression Note (Addendum)
Transition of Care [facility]) - Progression Note    Patient Details  Name: Rebekah Burton MRN: [id number] Date of Birth: 08-17-1976  Transition of Care [facility]) CM/SW Contact  Curlene Labrum, RN Phone Number: 03/17/2021, 7:24 AM  Clinical Narrative:    Case management reached out to numerous home health companies yesterday afternoon and was able to secure a home health RN through [facility] to follow the patient for wound care management of left breast.  [facility] will be able to provide home health services starting Sunday, 03/21/2021 if patient is able to discharge this week.  [facility] was given the patient's discharging address - [place], [place], [place].  CM and MSW will continue to follow the patient for discharge planning to [facility] tomorrow, 03/18/2021.  03/17/2021 1426 - I spoke with Marina Goodell, pharmacy tech on the phone and she is filling the patient's discharge medications to be delivered to the patient's room.   [facility] will be follow the patient for home health needs starting on Sunday, 03/21/2021.  The patient's Left breast drain was removed this morning by surgery and patient's dressing changes will be followed at the [place] home.  I messaged bedside nursing staff to please send the patient to the assisted living with dressing supplies for home.   Expected Discharge Plan: Group Home Barriers to Discharge: Continued Medical Work up  Expected Discharge Plan and Services Expected Discharge Plan: Group Home In-house Referral: Clinical Social Work Discharge Planning Services: CM Consult Post Acute Care Choice: Dialysis Living arrangements for the past 2 months: Single Family Home                 DME Arranged: N/A DME Agency: NA       HH Arranged: RN [place] Agency: [facility] Date HH Agency Contacted: 01/21/21 Time [place]: [id number] Representative spoke with at [place]: [place] ([place]) Interventions    Readmission Risk Interventions Readmission Risk Prevention Plan 03/16/2021  Transportation Screening Complete  PCP or Specialist Appt within 5-7 Days Complete  Home Care Screening Complete  Medication Review (RN CM) Complete

## 2021-03-17 NOTE — Progress Notes (Signed)
Subjective: No complaints.  Except refusing Prosource secondary to taste will change to Nepro, for HD today  Objective Vital signs in last 24 hours: Vitals:   03/16/21 1700 03/16/21 2125 03/17/21 0459 03/17/21 1013  BP: 119/75 109/60 109/64 120/88  Pulse: 68 66 79 70  Resp: '16 16 18 17  '$ Temp: 98.1 F (36.7 C) 98.2 F (36.8 C) 98.1 F (36.7 C) 98.2 F (36.8 C)  TempSrc: Oral Oral Oral Oral  SpO2: 99% 98% 96% 100%  Weight:      Height:       Weight change:   Physical Exam: General:Alert obese female  NAD Heart:RRR, no MRG Lungs:CTA nonlabored breathing  Abdomen:Obese bowel sounds normoactive, soft ND NT Extremities:No pedal edema Dialysis Access:Left arm AV fistula positive bruit  Dialysis Orders: [place] 4h 70mn 2/2.5 bath 105kg AVF  Heparin 7000 units IV initial bolus Heparin 3000 units IV mid run.  parsabiv 15 mgIVthree times a week with HD mircera 150 mcgIVevery 2 weeks - last given 01/13/21 hectorol 3 mcgIVthree times a week  Problem/Plan: 1.. ESRD: HD MWF.HD on schedule  2.Left breast abscess -per [facility] and admit teams/p I&D abscess 01/15/2021 and 02/25/21 path neg.for malignancy on 01/21and 02/26/2019.Completed antibiotic course. Noted [facility] 03/16/2021 bedside I&D, said "no antibiotics needed" 2. 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 3.Covid 19 on admission. Now out of isolation window.  6. Secondary hyperparathyroidism - Phos 5.5onRenvela binder. Corr Ca elevated Use 2.0 Ca bath with HD.No vitamin D 7. Anemia. --HGB10.3.Aranesp q. weekly Monday 150 8. HTN/volume -BP controlled, edema resolved.Will bebelow EDW lowerat d/c.Some hyponatremia, 129 ,past week UF on HD as BP allows 9. Nutrition -Albumin3.1. Renal diet .On Prosource  supplement change Nepro as patient refuses Prosource  DErnest Haber PA-C [facility] Beeper [phone]/23/2022,10:52 AM  LOS: 61 days   Labs: Basic Metabolic Panel: Recent Labs  Lab 03/12/21 0736 03/14/21 1027 03/15/21 0708  NA 128* 128* 129*  K 4.5 4.1 4.5  CL 96* 88* 88*  CO2 '23 28 29  '$ GLUCOSE 135* 148* 145*  BUN 31* 38* 46*  CREATININE 10.08* 10.37* 12.54*  CALCIUM 10.2 10.4* 10.7*  PHOS 5.2* 4.8* 5.5*   Liver Function Tests: Recent Labs  Lab 03/12/21 0736 03/14/21 1027 03/15/21 0708  ALBUMIN 3.0* 3.0* 3.1*   No results for input(s): LIPASE, AMYLASE in the last 168 hours. No results for input(s): AMMONIA in the last 168 hours. CBC: Recent Labs  Lab 03/11/21 1202 03/12/21 0736 03/14/21 1027 03/15/21 0708  WBC 5.1 5.4 6.3 6.0  HGB 10.3* 10.2* 9.9* 10.3*  HCT 33.7* 32.2* 32.3* 33.7*  MCV 100.3* 100.0 99.4 100.3*  PLT 183 198 202 211   Cardiac Enzymes: No results for input(s): CKTOTAL, CKMB, CKMBINDEX, TROPONINI in the last 168 hours. CBG: No results for input(s): GLUCAP in the last 168 hours.  Studies/Results: No results found. Medications: . sodium chloride    . sodium chloride 10 mL/hr at 02/27/21 0831   . (feeding supplement) PROSource Plus  30 mL Oral BID BM  . carvedilol  25 mg Oral BID WC  . Chlorhexidine Gluconate Cloth  6 each Topical Q0600  . cloNIDine  0.3 mg Oral TID  . darbepoetin (ARANESP) injection - DIALYSIS  150 mcg Intravenous Q Mon-HD  . heparin  5,000 Units Subcutaneous Q8H  . ibuprofen  600 mg Oral TID  . multivitamin  1 tablet  Oral QHS  . pantoprazole  40 mg Oral Daily  . pregabalin  75 mg Oral QHS  . senna  1 tablet Oral BID  . sevelamer carbonate  3,200 mg Oral TID WC

## 2021-03-17 NOTE — Progress Notes (Signed)
CSW was notified the patient's transportation was approved in [facility] for HD appointments.   CSW notified Jamas Lav at the [facility] of information - Jamas Lav will provide transportation at discharge.   Patient will receive her medications through [place] prior to discharge.  NP ordered a COVID swab for patient to result prior to discharge.  Madilyn Fireman, MSW, LCSW Transitions of Care  Clinical Social Worker II [phone]

## 2021-03-17 NOTE — Care Management Important Message (Signed)
Important Message  Patient Details  Name: Rebekah Burton MRN: [id number] Date of Birth: [birth year]   Medicare Important Message Given:  Yes     Fredick Schlosser P [place] 03/17/2021, 2:57 PM

## 2021-03-17 NOTE — Progress Notes (Signed)
[facility] PROGRESS NOTE  Rebekah Burton [id number] DOB: [birth year] DOA: 01/15/2021 PCP: Trey Sailors, PA  Status: Remains inpatient appropriate because:Unsafe d/c plan and Inpatient level of care appropriate due to severity of illness   Dispo: The patient is from: Homeless              Anticipated d/c is to: Group home ie family care home in [place].  Anticipate discharge date 3/25 after outpatient dialysis seat established.              Patient currently is not medically stable to d/c.  Patient has had a third recurrence of breast swelling and pain with drainage and has required a bedside I&D as of 3/22.  At a minimum patient will need home health services with RN for wound care after discharge given she will discharge to a family care home and caretakers in the home would likely not be eligible to provide this type of care to the patient.  When she does have probably hydrated all of her prescriptions to present hydromorphone from nursing home but none her study at that trimethadione any of her staff yet   Difficult to place patient Yes   Level of care: Med-Surg  Code Status: Full Family Communication:  DVT prophylaxis: SQ Heparin Vaccination status: Unknown-Incidentally Covid positive during this admission (01/15/2021)  Foley catheter: No  HPI:  45 y.o. female with PMH significant for ESRD on HD MWF, blindness, hypertension, PAD. Patient presented to the ED on 01/15/2021 with breast abscess, underwent I&D by general surgery.  She was initially treated with IV antibiotics later completed a course with oral antibiotics.  She was also found to be incidental Covid positive. Now she is off contact precautions. 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 IR and vascular were consulted. aVF is now working.Dialysis per nephrology.   Subjective: Awakened from sleep.   Continues to report significant left breast pain.  Aware that surgery has cleared her and no longer needs hospitalization.  States is having difficulty contacting person outside of hospital but has all of her clothing.  Aware she will likely discharge within the next 24 to 48 hours.  Objective: Vitals:   03/17/21 0459 03/17/21 1013  BP: 109/64 120/88  Pulse: 79 70  Resp: 18 17  Temp: 98.1 F (36.7 C) 98.2 F (36.8 C)  SpO2: 96% 100%    Intake/Output Summary (Last 24 hours) at 03/17/2021 1403 Last data filed at 03/16/2021 2200 Gross per 24 hour  Intake 660 ml  Output 0 ml  Net 660 ml   Filed Weights   03/14/21 2102 03/15/21 0837 03/15/21 1218  Weight: 104 kg 103.9 kg 100.9 kg    Exam:  Constitutional: Alert,  Breast: Left breast remains tender to minimal touch.  Dressing in place over recent I/D site. Respiratory: Lungs are clear, room air Cardiac: Heart sounds S1-S2, pulse regular, extremities warm to touch Abdomen:  LBM 3/21, soft and nontender with bowel sounds present.  Excellent appetite Neurologic: CN 2-12 grossly intact except for known blindness/visual impairment. Sensation intact, DTR normal. Strength 5/5 x all 4 extremities.  Ext: Prior bilateral transmetatarsal amputations Psychiatric: Alert and oriented.  Appropriate affect   Assessment/Plan: Acute problems: Breast abscess/recurrent -s/p incision drainage done by general surgery.-Pathology without any evidence of malignancy.  Treated initially with IV Unasyn given gram-positive cocci findings on cultures and subsequently transitioned to Augmentin which was  completed -On 3/1 reemergent breast abscess.  General surgery consulted and patient was resumed on Augmentin -3/2 US guided aspiration of complex fluid collection yielded minimal fluid returns -3/3 underwent intraoperative surgical debridement as well as biopsy to rule out underlying malignancy-pathology negative for malignancy and cultures negative for growth;  on 3/8 surgery cleared from a surgical standpoint for discharge. -Continue OxyIR 15 mg every 4 hours as needed plus Tylenol 325 mg every 4 hours as needed - continue IV morphine prn for breakthrough pain - continue Lyrica 75 mg daily as adjunct for pain management significant improvement in pain.  Of note her primary insurance requires preauthorization for Lyrica but Medicaid will pay for this medication at $4 per prescription -3/22 underwent bedside I&D.  Packing removed on 3/23.  Surgery reports no indication for antibiotics and is otherwise been cleared to discharge home with daily and as needed wound care.  Home health nursing has been set up by CM.  Incidental COVID-19 -patient was asymptomatic, no evidence of pulmonary involvement.  -Off isolation.Currently on room air.  ESRD -nephrology was consulted, had issues with cannulation of aVF. IR and vascular was consulted and underwent fistulogram demonstrated pseudoaneurysm.  -AVF is now functional and being utilized for dialysis sessions -Dialysis per nephrology.  -Continue sevelamer.  Anemia of chronic disease -monitor intermittently, hemoglobin is stable at 9.1. Continue Aranesp.   Other problems: Hyponatremia -patient on hemodialysis, nephrology following. Sodium has remained stable between 128 and 134 mostly altered by hemodialysis     Hypertension -Blood pressure controlled on clonidine and Coreg.  Peripheral artery disease -hx of Fem-pop bypass. -Has history of left transmetatarsal amputation.    Data Reviewed: Basic Metabolic Panel: Recent Labs  Lab 03/11/21 1202 03/12/21 0736 03/14/21 1027 03/15/21 0708  NA 134* 128* 128* 129*  K 3.8 4.5 4.1 4.5  CL 95* 96* 88* 88*  CO2 '30 23 28 29  '$ GLUCOSE 128* 135* 148* 145*  BUN 20 31* 38* 46*  CREATININE 8.34* 10.08* 10.37* 12.54*  CALCIUM 10.2 10.2 10.4* 10.7*  PHOS 4.5 5.2* 4.8* 5.5*   Liver Function Tests: Recent Labs  Lab 03/11/21 1202 03/12/21 0736  03/14/21 1027 03/15/21 0708  ALBUMIN 3.1* 3.0* 3.0* 3.1*   No results for input(s): LIPASE, AMYLASE in the last 168 hours. No results for input(s): AMMONIA in the last 168 hours. CBC: Recent Labs  Lab 03/11/21 1202 03/12/21 0736 03/14/21 1027 03/15/21 0708  WBC 5.1 5.4 6.3 6.0  HGB 10.3* 10.2* 9.9* 10.3*  HCT 33.7* 32.2* 32.3* 33.7*  MCV 100.3* 100.0 99.4 100.3*  PLT 183 198 202 211   Cardiac Enzymes: No results for input(s): CKTOTAL, CKMB, CKMBINDEX, TROPONINI in the last 168 hours. BNP (last 3 results) Recent Labs    01/18/21 0136 01/19/21 0030 01/20/21 0020  BNP 1,410.7* 1,122.4* 1,383.7*    ProBNP (last 3 results) No results for input(s): PROBNP in the last 8760 hours.  CBG: No results for input(s): GLUCAP in the last 168 hours.  Recent Results (from the past 240 hour(s))  SARS CORONAVIRUS 2 (TAT 6-24 HRS) Nasopharyngeal Nasopharyngeal Swab     Status: None   Collection Time: 03/10/21 10:29 AM   Specimen: Nasopharyngeal Swab  Result Value Ref Range Status   SARS Coronavirus 2 NEGATIVE NEGATIVE Final    Comment: (NOTE) SARS-CoV-2 target nucleic acids are NOT DETECTED.  The SARS-CoV-2 RNA is generally detectable in upper and lower respiratory specimens during the acute phase of infection. Negative results do not preclude SARS-CoV-2 infection, do  not rule out co-infections with other pathogens, and should not be used as the sole basis for treatment or other patient management decisions. Negative results must be combined with clinical observations, patient history, and epidemiological information. The expected result is Negative.  Fact Sheet for Patients: SugarRoll.be  Fact Sheet for Healthcare Providers: https://www.woods-mathews.com/  This test is not yet approved or cleared by the [place] FDA and  has been authorized for detection and/or diagnosis of SARS-CoV-2 by FDA under an Emergency Use Authorization  (EUA). This EUA will remain  in effect (meaning this test can be used) for the duration of the COVID-19 declaration under Se ction 564(b)(1) of the Act, 21 U.S.C. section 360bbb-3(b)(1), unless the authorization is terminated or revoked sooner.  Performed at [facility], [place] [address] [place], [place] [address]      Studies: No results found.  Scheduled Meds: . carvedilol  25 mg Oral BID WC  . Chlorhexidine Gluconate Cloth  6 each Topical Q0600  . cloNIDine  0.3 mg Oral TID  . darbepoetin (ARANESP) injection - DIALYSIS  150 mcg Intravenous Q Mon-HD  . feeding supplement (NEPRO CARB STEADY)  237 mL Oral BID BM  . heparin  5,000 Units Subcutaneous Q8H  . ibuprofen  600 mg Oral TID  . multivitamin  1 tablet Oral QHS  . pantoprazole  40 mg Oral Daily  . pregabalin  75 mg Oral QHS  . senna  1 tablet Oral BID  . sevelamer carbonate  3,200 mg Oral TID WC   Continuous Infusions: . sodium chloride    . sodium chloride 10 mL/hr at 02/27/21 [id number]    Active Problems:   ESRD (end stage renal disease) ([place])   Breast abscess   COVID-19 virus infection   Breast abscess of female   Left breast abscess   Poor social situation   Complication of vascular access for dialysis   Consultants:  Nephrology  General surgery  Interventional radiology  Procedures:  Aspiration of breast abscess per IR 3/1 with further image guided aspiration of complex breast abscess planned for 3/2  Antibiotics: Anti-infectives (From admission, onward)   Start     Dose/Rate Route Frequency Ordered Stop   02/27/21 1730  clindamycin (CLEOCIN) capsule 300 mg        300 mg Oral Every 8 hours 02/27/21 1631 03/03/21 0511   02/24/21 2200  amoxicillin-clavulanate (AUGMENTIN) 500-125 MG per tablet 500 mg  Status:  Discontinued        1 tablet Oral Daily at bedtime 02/23/21 1149 02/24/21 0817   02/24/21 1200  clindamycin (CLEOCIN) capsule 300 mg  Status:  Discontinued        300 mg Oral Every 6 hours  02/24/21 0817 02/24/21 0952   02/24/21 1200  clindamycin (CLEOCIN) IVPB 600 mg  Status:  Discontinued        600 mg 100 mL/hr over 30 Minutes Intravenous Every 6 hours 02/24/21 0952 02/27/21 1631   02/23/21 1245  amoxicillin-clavulanate (AUGMENTIN) 500-125 MG per tablet 500 mg        1 tablet Oral  Once 02/23/21 1149 02/23/21 1445   02/23/21 1215  amoxicillin-clavulanate (AUGMENTIN) 875-125 MG per tablet 1 tablet  Status:  Discontinued        1 tablet Oral Every 12 hours 02/23/21 1120 02/23/21 1149   01/23/21 1400  amoxicillin-clavulanate (AUGMENTIN) 500-125 MG per tablet 500 mg        500 mg Oral Every 24 hours 01/23/21 1124 01/25/21 1657  01/23/21 1015  amoxicillin-clavulanate (AUGMENTIN) 875-125 MG per tablet 1 tablet  Status:  Discontinued        1 tablet Oral Every 12 hours 01/23/21 1002 01/23/21 1124   01/20/21 0845  vancomycin (VANCOCIN) 1-5 GM/200ML-% IVPB       Note to Pharmacy: Judieth Keens  : cabinet override      01/20/21 0845 01/20/21 2059   01/18/21 1200  vancomycin (VANCOCIN) IVPB 1000 mg/200 mL premix  Status:  Discontinued        1,000 mg 200 mL/hr over 60 Minutes Intravenous Every M-W-F (Hemodialysis) 01/16/21 1017 01/21/21 1324   01/17/21 2200  Ampicillin-Sulbactam (UNASYN) 3 g in sodium chloride 0.9 % 100 mL IVPB  Status:  Discontinued        3 g 200 mL/hr over 30 Minutes Intravenous Daily at bedtime 01/16/21 1058 01/23/21 1002   01/17/21 1000  remdesivir 100 mg in sodium chloride 0.9 % 100 mL IVPB       "Followed by" Linked Group Details   100 mg 200 mL/hr over 30 Minutes Intravenous Daily 01/15/21 2357 01/18/21 0918   01/16/21 1145  Ampicillin-Sulbactam (UNASYN) 3 g in sodium chloride 0.9 % 100 mL IVPB        3 g 200 mL/hr over 30 Minutes Intravenous  Once 01/16/21 1058 01/16/21 1417   01/16/21 1100  vancomycin (VANCOREADY) IVPB 1500 mg/300 mL        1,500 mg 150 mL/hr over 120 Minutes Intravenous  Once 01/16/21 1017 01/16/21 1849   01/16/21 0300   remdesivir 200 mg in sodium chloride 0.9% 250 mL IVPB       "Followed by" Linked Group Details   200 mg 580 mL/hr over 30 Minutes Intravenous Once 01/15/21 2357 01/16/21 0543   01/15/21 2345  amoxicillin-clavulanate (AUGMENTIN) 875-125 MG per tablet 1 tablet  Status:  Discontinued        1 tablet Oral Every 12 hours 01/15/21 2334 01/15/21 2357   01/15/21 1800  vancomycin (VANCOCIN) IVPB 1000 mg/200 mL premix        1,000 mg 200 mL/hr over 60 Minutes Intravenous  Once 01/15/21 1753 01/15/21 2120       Time spent: 20 minutes    Erin Hearing ANP  [facility] 7 am - 330 pm/M-F for direct patient care and secure chat Please refer to Amion for contact info 61  days

## 2021-03-17 NOTE — Progress Notes (Signed)
[facility] Progress Note  20 Days Post-Op  Subjective: CC-  Up in chair eating breakfast. S/p repeat I&D yesterday at bedside, only bloody drainage noted and no pus. Afebrile  Objective: Vital signs in last 24 hours: Temp:  [98 F (36.7 C)-98.2 F (36.8 C)] 98.1 F (36.7 C) (03/23 0459) Pulse Rate:  [64-79] 79 (03/23 0459) Resp:  [16-18] 18 (03/23 0459) BP: (109-119)/(60-77) 109/64 (03/23 0459) SpO2:  [96 %-99 %] 96 % (03/23 0459) Last BM Date: 03/15/21  Intake/Output from previous day: 03/22 0701 - 03/23 0700 In: 1380 [P.O.:1380] Out: 0  Intake/Output this shift: No intake/output data recorded.  PE: Gen:  Alert, NAD Pulm: rate and effort normal Left breast: open wound with no active drainage, no fluctuance, mild surrounding induration along superior aspect of wound, no erythema or warmth  Lab Results:  Recent Labs    03/14/21 1027 03/15/21 0708  WBC 6.3 6.0  HGB 9.9* 10.3*  HCT 32.3* 33.7*  PLT 202 211   BMET Recent Labs    03/14/21 1027 03/15/21 0708  NA 128* 129*  K 4.1 4.5  CL 88* 88*  CO2 28 29  GLUCOSE 148* 145*  BUN 38* 46*  CREATININE 10.37* 12.54*  CALCIUM 10.4* 10.7*   PT/INR No results for input(s): LABPROT, INR in the last 72 hours. CMP     Component Value Date/Time   NA 129 (L) 03/15/2021 0708   K 4.5 03/15/2021 0708   CL 88 (L) 03/15/2021 0708   CO2 29 03/15/2021 0708   GLUCOSE 145 (H) 03/15/2021 0708   BUN 46 (H) 03/15/2021 0708   CREATININE 12.54 (H) 03/15/2021 0708   CALCIUM 10.7 (H) 03/15/2021 0708   PROT 6.0 (L) 01/23/2021 0426   ALBUMIN 3.1 (L) 03/15/2021 0708   AST 46 (H) 01/23/2021 0426   ALT 35 01/23/2021 0426   ALKPHOS 42 01/23/2021 0426   BILITOT 0.7 01/23/2021 0426   GFRNONAA 3 (L) 03/15/2021 0708   GFRAA 5 (L) 02/13/2019 0734   Lipase     Component Value Date/Time   LIPASE 33 07/17/2018 1145       Studies/Results: No results found.  Anti-infectives: Anti-infectives (From admission,  onward)   Start     Dose/Rate Route Frequency Ordered Stop   02/27/21 1730  clindamycin (CLEOCIN) capsule 300 mg        300 mg Oral Every 8 hours 02/27/21 1631 03/03/21 0511   02/24/21 2200  amoxicillin-clavulanate (AUGMENTIN) 500-125 MG per tablet 500 mg  Status:  Discontinued        1 tablet Oral Daily at bedtime 02/23/21 1149 02/24/21 0817   02/24/21 1200  clindamycin (CLEOCIN) capsule 300 mg  Status:  Discontinued        300 mg Oral Every 6 hours 02/24/21 0817 02/24/21 0952   02/24/21 1200  clindamycin (CLEOCIN) IVPB 600 mg  Status:  Discontinued        600 mg 100 mL/hr over 30 Minutes Intravenous Every 6 hours 02/24/21 0952 02/27/21 1631   02/23/21 1245  amoxicillin-clavulanate (AUGMENTIN) 500-125 MG per tablet 500 mg        1 tablet Oral  Once 02/23/21 1149 02/23/21 1445   02/23/21 1215  amoxicillin-clavulanate (AUGMENTIN) 875-125 MG per tablet 1 tablet  Status:  Discontinued        1 tablet Oral Every 12 hours 02/23/21 1120 02/23/21 1149   01/23/21 1400  amoxicillin-clavulanate (AUGMENTIN) 500-125 MG per tablet 500 mg  500 mg Oral Every 24 hours 01/23/21 1124 01/25/21 1657   01/23/21 1015  amoxicillin-clavulanate (AUGMENTIN) 875-125 MG per tablet 1 tablet  Status:  Discontinued        1 tablet Oral Every 12 hours 01/23/21 1002 01/23/21 1124   01/20/21 0845  vancomycin (VANCOCIN) 1-5 GM/200ML-% IVPB       Note to Pharmacy: Judieth Keens  : cabinet override      01/20/21 0845 01/20/21 2059   01/18/21 1200  vancomycin (VANCOCIN) IVPB 1000 mg/200 mL premix  Status:  Discontinued        1,000 mg 200 mL/hr over 60 Minutes Intravenous Every M-W-F (Hemodialysis) 01/16/21 1017 01/21/21 1324   01/17/21 2200  Ampicillin-Sulbactam (UNASYN) 3 g in sodium chloride 0.9 % 100 mL IVPB  Status:  Discontinued        3 g 200 mL/hr over 30 Minutes Intravenous Daily at bedtime 01/16/21 1058 01/23/21 1002   01/17/21 1000  remdesivir 100 mg in sodium chloride 0.9 % 100 mL IVPB       "Followed  by" Linked Group Details   100 mg 200 mL/hr over 30 Minutes Intravenous Daily 01/15/21 2357 01/18/21 0918   01/16/21 1145  Ampicillin-Sulbactam (UNASYN) 3 g in sodium chloride 0.9 % 100 mL IVPB        3 g 200 mL/hr over 30 Minutes Intravenous  Once 01/16/21 1058 01/16/21 1417   01/16/21 1100  vancomycin (VANCOREADY) IVPB 1500 mg/300 mL        1,500 mg 150 mL/hr over 120 Minutes Intravenous  Once 01/16/21 1017 01/16/21 1849   01/16/21 0300  remdesivir 200 mg in sodium chloride 0.9% 250 mL IVPB       "Followed by" Linked Group Details   200 mg 580 mL/hr over 30 Minutes Intravenous Once 01/15/21 2357 01/16/21 0543   01/15/21 2345  amoxicillin-clavulanate (AUGMENTIN) 875-125 MG per tablet 1 tablet  Status:  Discontinued        1 tablet Oral Every 12 hours 01/15/21 2334 01/15/21 2357   01/15/21 1800  vancomycin (VANCOCIN) IVPB 1000 mg/200 mL premix        1,000 mg 200 mL/hr over 60 Minutes Intravenous  Once 01/15/21 1753 01/15/21 2120       Assessment/Plan ESRD- M/W/F PAD Blind Chronic anemia Obesity BMI 40.76  RecurrentLeft breast abscess S/pIncision and drainage of left breast abscess with excisional debridement-01/15/21 -Dr. Zenia Resides S/p IR US guided aspiration - Dr. Pascal Lux- 02/24/21 - yielding 3cc of purulent appearing thick fluid. Cx's pending and NGTD.  S/pIncision and drainage of left breast wound, left breast biopsy- Dr. Zenia Resides- 02/25/21  -path was negative for malignancy1/21and 3/3, although still some concern for possible inflammatory breast cancer.Willbenefit from [place] outpatient S/p bedside incision and drainage - Barkley Boards - 03/16/21 -No abscess/ purulent drainage noted on procedure yesterday. D/c packing, do not need to repack. She does not need antibiotics from our standpoint. Dry dressing over wound, change daily and PRN saturation. Shower with wound open.  Call with questions or concerns.   LOS: 61 days    [facility] 03/17/2021, 8:18 AM Please see Amion for pager number during day hours 7:00am-4:30pm

## 2021-03-18 ENCOUNTER — Other Ambulatory Visit: Payer: Self-pay | Admitting: Nurse Practitioner

## 2021-03-18 DIAGNOSIS — E7800 Pure hypercholesterolemia, unspecified: Secondary | ICD-10-CM | POA: Diagnosis not present

## 2021-03-18 DIAGNOSIS — D519 Vitamin B12 deficiency anemia, unspecified: Secondary | ICD-10-CM | POA: Diagnosis not present

## 2021-03-18 DIAGNOSIS — R34 Anuria and oliguria: Secondary | ICD-10-CM | POA: Diagnosis not present

## 2021-03-18 DIAGNOSIS — J9601 Acute respiratory failure with hypoxia: Secondary | ICD-10-CM | POA: Diagnosis not present

## 2021-03-18 DIAGNOSIS — N186 End stage renal disease: Secondary | ICD-10-CM | POA: Diagnosis not present

## 2021-03-18 DIAGNOSIS — E118 Type 2 diabetes mellitus with unspecified complications: Secondary | ICD-10-CM | POA: Diagnosis not present

## 2021-03-18 DIAGNOSIS — G608 Other hereditary and idiopathic neuropathies: Secondary | ICD-10-CM | POA: Diagnosis not present

## 2021-03-18 DIAGNOSIS — N250 Renal osteodystrophy: Secondary | ICD-10-CM | POA: Diagnosis not present

## 2021-03-18 DIAGNOSIS — E039 Hypothyroidism, unspecified: Secondary | ICD-10-CM | POA: Diagnosis not present

## 2021-03-18 DIAGNOSIS — Z992 Dependence on renal dialysis: Secondary | ICD-10-CM | POA: Diagnosis not present

## 2021-03-18 DIAGNOSIS — I517 Cardiomegaly: Secondary | ICD-10-CM | POA: Diagnosis not present

## 2021-03-18 DIAGNOSIS — Z716 Tobacco abuse counseling: Secondary | ICD-10-CM | POA: Diagnosis not present

## 2021-03-18 DIAGNOSIS — R5383 Other fatigue: Secondary | ICD-10-CM | POA: Diagnosis not present

## 2021-03-18 DIAGNOSIS — G603 Idiopathic progressive neuropathy: Secondary | ICD-10-CM | POA: Diagnosis not present

## 2021-03-18 DIAGNOSIS — E119 Type 2 diabetes mellitus without complications: Secondary | ICD-10-CM | POA: Diagnosis not present

## 2021-03-18 DIAGNOSIS — E559 Vitamin D deficiency, unspecified: Secondary | ICD-10-CM | POA: Diagnosis not present

## 2021-03-18 DIAGNOSIS — U071 COVID-19: Secondary | ICD-10-CM | POA: Diagnosis not present

## 2021-03-18 DIAGNOSIS — R69 Illness, unspecified: Secondary | ICD-10-CM | POA: Diagnosis not present

## 2021-03-18 DIAGNOSIS — H5410 Blindness, one eye, low vision other eye, unspecified eyes: Secondary | ICD-10-CM | POA: Diagnosis not present

## 2021-03-18 DIAGNOSIS — N611 Abscess of the breast and nipple: Secondary | ICD-10-CM | POA: Diagnosis not present

## 2021-03-18 DIAGNOSIS — M79661 Pain in right lower leg: Secondary | ICD-10-CM | POA: Diagnosis not present

## 2021-03-18 DIAGNOSIS — I120 Hypertensive chronic kidney disease with stage 5 chronic kidney disease or end stage renal disease: Secondary | ICD-10-CM | POA: Diagnosis not present

## 2021-03-18 DIAGNOSIS — F17210 Nicotine dependence, cigarettes, uncomplicated: Secondary | ICD-10-CM | POA: Diagnosis not present

## 2021-03-18 DIAGNOSIS — D631 Anemia in chronic kidney disease: Secondary | ICD-10-CM | POA: Diagnosis not present

## 2021-03-18 DIAGNOSIS — I10 Essential (primary) hypertension: Secondary | ICD-10-CM | POA: Diagnosis not present

## 2021-03-18 LAB — SARS CORONAVIRUS 2 (TAT 6-24 HRS): SARS Coronavirus 2: NEGATIVE

## 2021-03-18 MED ORDER — LANTHANUM CARBONATE 1000 MG PO CHEW: 1000.0000 mg | CHEWABLE_TABLET | Freq: Three times a day (TID) | ORAL | 3 refills | Status: DC

## 2021-03-18 MED FILL — LANTHANUM CARBONATE 1000 MG: 1000 | 30 days supply | Qty: 90 | Fill #0

## 2021-03-18 NOTE — TOC Transition Note (Signed)
Transition of Care [facility]) - CM/SW Discharge Note   Patient Details  Name: Rebekah Burton MRN: [id number] Date of Birth: [birth year]  Transition [facility]) CM/SW Contact:  Curlene Labrum, RN Phone Number: 03/18/2021, 11:25 AM   Clinical Narrative:    Case management met with the patient at the bedside regarding transitions of care for a Family Care home today.  The patient was given clothing and shoes by Madilyn Fireman, MSW this morning for discharge since her friend was unable to call or deliver these items prior to discharge today.  CM placed a MATCH for the patient for medication assistance and Co-pays were paid through [facility] petty cash of 12.00.    The patient is waiting for COVID results to come back today before discharged to [facility] by car.  The COVID results were collected this morning by the nursing staff and are currently pending results before Family Care home is notified to pick the patient up for transport to the home.  CM will continue to follow the patient for discharge planning.   Final next level of care: Group Home (Discharging to [facility] in [place], [place], [place]; Will need home health F/U with wound care) Barriers to Discharge: Continued Medical Work up   Patient Goals and CMS Choice Patient states their goals for this hospitalization and ongoing recovery are:: Patient agreeable to discharge to family care home once medically stable for discharge. CMS Medicare.gov Compare Post Acute Care list provided to:: Patient Choice offered to / list presented to : Patient  Discharge Placement                       Discharge Plan and Services In-house Referral: Clinical Social Work Discharge Planning Services: CM Consult Post Acute Care Choice: Dialysis          DME Arranged: N/A DME Agency: NA       HH Arranged: RN [place] Agency: [facility] Date [place]: 01/21/21 Time [place]: 1324 Representative  spoke with at [place]: [place] ([place]) Interventions     Readmission Risk Interventions Readmission Risk Prevention Plan 03/16/2021  Transportation Screening Complete  PCP or Specialist Appt within 5-7 Days Complete  Home Care Screening Complete  Medication Review (RN CM) Complete

## 2021-03-18 NOTE — Progress Notes (Signed)
Physical Therapy Treatment Patient Details Name: Rebekah Burton MRN: [id number] DOB: October 21, 1976 Today's Date: 03/18/2021    History of Present Illness 45 y.o. female with developed a breast abscess and was seen in ED 01/04/21 where U/S confirmed abscess. She was to have been seen as outpatient for aspiration/drainage but did not get to make that appt. In HD the breast abscess opened and drained spontaneously. Because of continued drainage she presents to [facility] for evaluation. Pt admited on 01/15/21 and found to be COVID+ taken to OR for further exploration, culture, and excisional debridement 01/15/21. Medical history significant of ESRD-HD M-W-F, blindness, HTN, PAD    PT Comments    Pt received in bed, reporting that her L breast abscess is very sore and she did not sleep well. Politely declined OOB mobility, stating she wanted to save energy for discharge from hospital today. Pt agreeable to bed level exercises. Provided patient with LE strengthening exercises to address bilateral knee pain. Education provided on proper footwear, pain management, and techniques to decrease pain with stair navigation. Pt left in bed with all needs met and call bell within reach.    Follow Up Recommendations  Supervision for mobility/OOB;SNF     Equipment Recommendations  None recommended by PT    Recommendations for Other Services       Precautions / Restrictions Precautions Precautions: Other (comment) Precaution Comments: pt is blind, needs assist for navigation, L breast abscess Restrictions Weight Bearing Restrictions: No    Mobility  Bed Mobility                    Transfers                    Ambulation/Gait                 Stairs             Wheelchair Mobility    Modified Rankin (Stroke Patients Only)       Balance Overall balance assessment: Needs assistance Sitting-balance support: No upper extremity supported;Feet supported Sitting balance-Leahy  Scale: Normal     Standing balance support: During functional activity;No upper extremity supported Standing balance-Leahy Scale: Good                              Cognition Arousal/Alertness: Awake/alert Behavior During Therapy: WFL for tasks assessed/performed Overall Cognitive Status: Within Functional Limits for tasks assessed                                        Exercises General Exercises - Lower Extremity Quad Sets: Both;Strengthening;15 reps Long Arc Quad: Strengthening;Both;15 reps Mini-Sqauts: Strengthening;Both;15 reps    General Comments        Pertinent Vitals/Pain Pain Assessment: Faces Faces Pain Scale: Hurts whole lot Pain Location: L breast abscess Pain Descriptors / Indicators: Discomfort;Aching Pain Intervention(s): Monitored during session    Home Living                      Prior Function            PT Goals (current goals can now be found in the care plan section) Acute Rehab PT Goals Patient Stated Goal: have less pain PT Goal Formulation: With patient Potential to Achieve Goals: Good Additional Goals Additional Goal #1: Pt will  be able to navigate her new home environment with supervision Additional Goal #2: Will score at least 18 on DGI    Frequency    Min 2X/week      PT Plan Current plan remains appropriate    Co-evaluation              AM-PAC PT "6 Clicks" Mobility   Outcome Measure  Help needed turning from your back to your side while in a flat bed without using bedrails?: None Help needed moving from lying on your back to sitting on the side of a flat bed without using bedrails?: None Help needed moving to and from a bed to a chair (including a wheelchair)?: None Help needed standing up from a chair using your arms (e.g., wheelchair or bedside chair)?: None Help needed to walk in hospital room?: A Little Help needed climbing 3-5 steps with a railing? : A Lot 6 Click Score:  21    End of Session   Activity Tolerance: Patient limited by pain Patient left: with call bell/phone within reach;in bed (sitting at EOB per her request) Nurse Communication: Mobility status PT Visit Diagnosis: Other abnormalities of gait and mobility (R26.89);Pain Pain - part of body:  (L breast and UE)     Time:  -     Charges:                        Rosita Kea, SPT

## 2021-03-18 NOTE — Progress Notes (Signed)
Subjective: Said tolerated dialysis yesterday noted for discharge today.  Will have follow-up with [facility] neurology group  Objective Vital signs in last 24 hours: Vitals:   03/17/21 1833 03/17/21 2007 03/18/21 0447 03/18/21 0851  BP: 112/88 108/73 140/73 (!) 102/55  Pulse: 76 72 70 75  Resp: '16 18 18 14  '$ Temp: 98.1 F (36.7 C) 98.2 F (36.8 C) 98.4 F (36.9 C) (!) 97.5 F (36.4 C)  TempSrc: Oral  Oral   SpO2: 100% 100% 98% 99%  Weight:      Height:       Weight change:   Physical Exam: General:Alert obese female NAD Heart:RRR, no MRG Lungs:CTA nonlabored breathing  Abdomen:Obese bowel sounds normoactive, soft ND NT Extremities:No pedal edema Dialysis Access:Left arm AV fistulapositive bruit  Dialysis Orders: [place] 4h 72mn 2/2.5 bath 105kg AVF  Heparin 7000 units IV initial bolus Heparin 3000 units IV mid run.  parsabiv 15 mgIVthree times a week with HD mircera 150 mcgIVevery 2 weeks - last given 01/13/21 hectorol 3 mcgIVthree times a week  Problem/Plan: 1.. ESRD: HD MWF.HD on schedule, [facility] 2.Left breast abscess -per [facility] and admit teams/p I&D abscess 01/15/2021 and 02/25/21 path neg.for malignancy on 01/21and 02/26/2019.Completed antibiotic course.Noted [facility] 03/16/2021 bedside I&D, said "no antibiotics needed" 2. 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 3.Covid 19 on admission. Now out of isolation window.  6. Secondary hyperparathyroidism - Phos 5.5onRenvela binder. Corr Ca elevated Use 2.0 Ca bath with HD.No vitamin D 7. Anemia. --HGB10.3.Aranesp q. weekly Monday 150 8. HTN/volume -BP controlled, edema resolved.Will bebelow EDW lowerat d/c.  100 kg,some hyponatremia,  131  yesterday predialysis,UF on HD as BP allows 9. Nutrition -Albumin3.1. Renal diet .On Prosource supplement changed to Nepro as patient refuses Prosource  DErnest Haber PA-C [facility] [phone]/24/2022,12:57 PM  LOS: 62 days   Labs: Basic Metabolic Panel: Recent Labs  Lab 03/14/21 1027 03/15/21 0708 03/17/21 1408  NA 128* 129* 131*  K 4.1 4.5 4.7  CL 88* 88* 93*  CO2 '28 29 27  '$ GLUCOSE 148* 145* 140*  BUN 38* 46* 41*  CREATININE 10.37* 12.54* 12.18*  CALCIUM 10.4* 10.7* 10.3  PHOS 4.8* 5.5* 5.5*   Liver Function Tests: Recent Labs  Lab 03/14/21 1027 03/15/21 0708 03/17/21 1408  ALBUMIN 3.0* 3.1* 3.0*   No results for input(s): LIPASE, AMYLASE in the last 168 hours. No results for input(s): AMMONIA in the last 168 hours. CBC: Recent Labs  Lab 03/12/21 0736 03/14/21 1027 03/15/21 0708 03/17/21 1408  WBC 5.4 6.3 6.0 5.4  HGB 10.2* 9.9* 10.3* 10.3*  HCT 32.2* 32.3* 33.7* 33.2*  MCV 100.0 99.4 100.3* 99.1  PLT 198 202 211 196   Cardiac Enzymes: No results for input(s): CKTOTAL, CKMB, CKMBINDEX, TROPONINI in the last 168 hours. CBG: No results for input(s): GLUCAP in the last 168 hours.  Studies/Results: No results found. Medications: . sodium chloride    . sodium chloride 10 mL/hr at 02/27/21 0831   . carvedilol  25 mg Oral BID WC  . Chlorhexidine Gluconate Cloth  6 each Topical Q0600  . cloNIDine  0.3 mg Oral TID  . darbepoetin (ARANESP) injection - DIALYSIS  150 mcg Intravenous Q Mon-HD  . feeding supplement (NEPRO CARB STEADY)  237 mL Oral BID BM  . heparin  5,000 Units Subcutaneous  Q8H  . ibuprofen  600 mg Oral TID  . multivitamin  1 tablet Oral QHS  . pantoprazole  40 mg Oral Daily  . pregabalin  75 mg Oral QHS  . senna  1 tablet Oral BID  . sevelamer carbonate  3,200 mg Oral TID WC

## 2021-03-18 NOTE — Discharge Summary (Addendum)
Physician Discharge Summary  Rebekah Burton [id number] DOB: 29-Aug-1976 DOA: 01/15/2021  PCP: Trey Sailors, PA  Admit date: 01/15/2021 Discharge date: 03/18/2021  Time spent: 37 minutes  Recommendations for Outpatient Follow-up:  1. Patient will continue dialysis on a TTS schedule after discharge at [facility] in [place] 2. Patient has follow-up appointment with Dr. Zenia Resides with general surgery on April 6 at 2:45 PM.  She is to arrive at this appointment 30 minutes early to complete any necessary paperwork she is to bring a copy of her photo ID and any insurance or Medicaid information. 3. [facility] will be providing the necessary home health services beginning on Sunday, 03/21/2021   Discharge Diagnoses:  Active Problems:   ESRD (end stage renal disease) ([facility])   Breast abscess   COVID-19 virus infection   Breast abscess of female   Left breast abscess   Poor social situation   Complication of vascular access for dialysis  SEPSIS was not present in conjunction with left breast abscess issues  Discharge Condition: Stable  Diet recommendation: Renal with a 1200 cc fluid restriction per day  [facility] Weights   03/15/21 1218 03/17/21 1400 03/17/21 1740  Weight: 100.9 kg 102 kg 101 kg    History of present illness:  45 y.o.femalewith PMH significant for ESRD on HD MWF, blindness, hypertension, PAD. Patient presented to the ED on 01/15/2021 with breast abscess, underwent I&D by general surgery. She was initially treated with IV antibiotics later completed a course with oral antibiotics. She was also found to be incidental Covid positive. Now she is off contact precautions. 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 IR and vascular were consulted. aVF is now working.Dialysis per nephrology.  Patient required additional hospital days secondary to reemergence of left breast abscess  requiring additional treatment from the surgical team.  Discharge disposition was complicated by difficulty initially locating an appropriate disposition site given patient's legally blind status, requirement for dialysis and limited funding based on her disability check.  Fortunately we were able to secure housing through a family care group home and patient will discharge today on 3/24.  Hospital Course:  Acute problems: Breast abscess/recurrent -s/p incision drainage done by general surgery.-Pathology without any evidence of malignancy.  Treated initially with IV Unasyn given gram-positive cocci findings on cultures and subsequently transitioned to Augmentin which was completed -On 3/1 reemergent breast abscess.  General surgery consulted and patient was resumed on Augmentin -3/2 US guided aspiration of complex fluid collection yielded minimal fluid returns -3/3 underwent intraoperative surgical debridement as well as biopsy to rule out underlying malignancy-pathology negative for malignancy and cultures negative for growth; on 3/8 surgery cleared from a surgical standpoint for discharge. -Continue OxyIR 15 mg every 4 hours as needed plus Tylenol 325 mg every 4 hours as needed - continue IV morphine prn for breakthrough pain - continue Lyrica 75 mg daily as adjunct for pain management significant improvement in pain.  Of note her primary insurance requires preauthorization for Lyrica but Medicaid will pay for this medication at $4 per prescription -3/22 underwent bedside I&D.  Packing removed on 3/23.  Surgery reports no indication for antibiotics and is otherwise been cleared to discharge home with daily and as needed wound care.  Home health nursing has been set up by CM.  Incidental COVID-19 -patient was asymptomatic, no evidence of pulmonary involvement.  -Off isolation.Currently on room air.  ESRD -nephrology was  consulted, had issues with cannulation of aVF. IR and vascular was  consulted and underwent fistulogram demonstrated pseudoaneurysm. -AVF is now functional and being utilized for dialysis sessions -Dialysis per nephrology.  -Continue sevelamer.  Anemia of chronic disease -monitor intermittently, hemoglobin is stable at 9.1. Continue Aranesp.   Other problems: Hyponatremia -patient on hemodialysis, nephrology following. Sodium has remained stable between 128 and 134 mostly altered by hemodialysis     Hypertension -Blood pressure controlled on clonidine and Coreg.  Peripheral artery disease -hx of Fem-pop bypass. -Has history of left transmetatarsal amputation.     Procedures:  Aspiration of breast abscess per IR 3/1 with further image guided aspiration of complex breast abscess planned for 3/2  Incision and drainage of breast abscess on 3/3  Bedside incision and drainage of breast abscess on 3/22   Consultations:  Nephrology  General surgery  Interventional radiology   Discharge Exam: Vitals:   03/18/21 0447 03/18/21 0851  BP: 140/73 (!) 102/55  Pulse: 70 75  Resp: 18 14  Temp: 98.4 F (36.9 C) (!) 97.5 F (36.4 C)  SpO2: 98% 99%   Constitutional: Alert,  Breast: Left breast remains tender to minimal touch.  Dressing in place over recent I/D site. Respiratory: Lungs are clear, room air Cardiac: Heart sounds S1-S2, pulse regular, extremities warm to touch Abdomen:  LBM 3/21, soft and nontender with bowel sounds present.  Excellent appetite Neurologic: CN 2-12 grossly intact except for known blindness/visual impairment. Sensation intact, DTR normal. Strength 5/5 x all 4 extremities.  Ext: Prior bilateral transmetatarsal amputations Psychiatric: Alert and oriented.  Appropriate affect  Discharge Instructions   Discharge Instructions    Call MD for:  persistant dizziness or light-headedness   Complete by: As directed    Call MD for:  redness, tenderness, or signs of infection (pain, swelling, redness, odor  or green/yellow discharge around incision site)   Complete by: As directed    Call MD for:  severe uncontrolled pain   Complete by: As directed    Call MD for:  temperature >100.4   Complete by: As directed    Diet general   Complete by: As directed    Renal diet w/ 1200 cc/24 hours   Discharge wound care:   Complete by: As directed    Cover wound with Telfa dressing and secure. Recommend shower daily with dressing off. Use warm compresses on wound frequently for discomfort   Increase activity slowly   Complete by: As directed      Allergies as of 03/18/2021      Reactions   Percocet [oxycodone-acetaminophen] Hives      Medication List    STOP taking these medications   amoxicillin-clavulanate 875-125 MG tablet Commonly known as: AUGMENTIN   Renvela 800 MG tablet Generic drug: sevelamer carbonate   tiZANidine 4 MG tablet Commonly known as: ZANAFLEX   traMADol 50 MG tablet Commonly known as: ULTRAM     TAKE these medications   acetaminophen 325 MG tablet Commonly known as: TYLENOL Take 2 tablets (650 mg total) by mouth every 6 (six) hours as needed for mild pain (or Fever >/= 101).   aspirin 81 MG EC tablet Take 1 tablet (81 mg total) by mouth daily.   b complex-vitamin c-folic acid 0.8 MG Tabs tablet Take 1 tablet by mouth every Monday, Wednesday, and Friday with hemodialysis.   carvedilol 25 MG tablet Commonly known as: COREG Take 1 tablet (25 mg total) by mouth 2 (two) times daily.   cloNIDine  0.3 MG tablet Commonly known as: CATAPRES Take 1 tablet (0.3 mg total) by mouth 3 (three) times daily.   ibuprofen 600 MG tablet Commonly known as: ADVIL Take 1 tablet (600 mg total) by mouth 3 (three) times daily.   lanthanum 1000 MG chewable tablet Commonly known as: Fosrenol Chew 1 tablet (1,000 mg total) by mouth 3 (three) times daily with meals.   lidocaine-prilocaine cream Commonly known as: EMLA Apply 1 application topically every Monday, Wednesday, and  Friday with hemodialysis.   Oxycodone HCl 10 MG Tabs Take 1 tablet (10 mg total) by mouth every 4 (four) hours as needed for moderate pain. What changed:   medication strength  how much to take   pantoprazole 40 MG tablet Commonly known as: PROTONIX Take 1 tablet (40 mg total) by mouth daily.   pregabalin 75 MG capsule Commonly known as: LYRICA Take 1 capsule (75 mg total) by mouth at bedtime.   senna 8.6 MG Tabs tablet Commonly known as: SENOKOT Take 1 tablet (8.6 mg total) by mouth 2 (two) times daily.            Discharge Care Instructions  (From admission, onward)         Start     Ordered   03/18/21 0000  Discharge wound care:       Comments: Cover wound with Telfa dressing and secure. Recommend shower daily with dressing off. Use warm compresses on wound frequently for discomfort   03/18/21 1046         Allergies  Allergen Reactions  . Percocet [Oxycodone-Acetaminophen] Hives    Follow-up Information    Dwan Bolt, MD. Go on 03/31/2021.   Specialty: General Surgery Why: 2:45 PM. Please arrive to the appointment 30 minutes early for paperwork. Please bring a copy of your photo ID and insurance card.  Contact information: [place]. [address] [phone]        [facility] Follow up.   Why: [facility] will be providing home health services starting on Sunday, 03/21/2021.               The results of significant diagnostics from this hospitalization (including imaging, microbiology, ancillary and laboratory) are listed below for reference.    Significant Diagnostic Studies: US BREAST LTD UNI LEFT INC AXILLA  Result Date: 02/23/2021 CLINICAL DATA:  Left breast swelling EXAM: ULTRASOUND OF THE left BREAST COMPARISON:  Ultrasound 01/05/2021, CT chest 01/05/2021 FINDINGS: Targeted ultrasound of the region of breast swelling is performed. Large complex fluid collection within the retroareolar left breast and  extending to the 3 and 6 o'clock positions of the left breast 1-2 cm from nipple. Measured complex fluid collection at the 3 o'clock position 2 cm from nipple measures 6.2 x 3.9 cm. Deeper fluid collection at the 1 o'clock position measuring 5.1 x 2.4 cm. Generalized edema within the breast tissues. IMPRESSION: Breast edema with large complex fluid collection within the retroareolar, lower, and outer left breast. RECOMMENDATION: Recommend aspiration of complex fluid and or surgical consultation. Mammographic evaluation following resolution of acute symptoms should also be considered. BI-RADS CATEGORY  4: Suspicious. These results will be called to the ordering clinician or representative by the Radiologist Assistant, and communication documented in the PACS or [employer]. Electronically Signed   By: Donavan Foil M.D.   On: 02/23/2021 18:20   US BREAST ASPIRATION LEFT  Result Date: 02/25/2021 INDICATION: Left-sided breast abscess. Please perform ultrasound-guided aspiration for  diagnostic and therapeutic purposes. EXAM: ULTRASOUND GUIDED BREAST CYST ASPIRATION COMPARISON:  Breast ultrasound-02/23/2021 MEDICATIONS: The patient is currently admitted to the hospital and receiving intravenous antibiotics. The antibiotics were administered within an appropriate time frame prior to the initiation of the procedure. ANESTHESIA/SEDATION: Moderate (conscious) sedation was employed during this procedure. A total of Versed 2 mg and Fentanyl 100 mcg was administered intravenously. Moderate Sedation Time: 14 minutes. The patient's level of consciousness and vital signs were monitored continuously by radiology nursing throughout the procedure under my direct supervision. CONTRAST:  None COMPLICATIONS: None immediate. PROCEDURE: Informed written consent was obtained from the patient after a discussion of the risks, benefits and alternatives to treatment. Preprocedural ultrasound scanning demonstrated grossly unchanged size  and appearance of the serpiginous fluid collection subjacent to the nipple with dominant component superior to the nipple at the approximately 12 o'clock position. Note, the patient has a pre-existing draining wound inferior to the nipple. A timeout was performed prior to the initiation of the procedure. The skin overlying the superior aspect of the left breast was prepped and draped in the usual sterile fashion. The overlying soft tissues were anesthetized with 1% lidocaine with epinephrine. Under direct ultrasound guidance, a 18 gauge trocar needle was advanced into the abscess/fluid collection. Multiple ultrasound images were saved for procedural documentation purposes. Approximately 3 cc of blood tinged purulent appearing fluid was aspirated from the collection. Next, a short Amplatz wire was coiled within the collection and the trocar needle was exchanged for a 5 [place] Yueh sheath catheter. The catheter was aspirated as it was slowly retracted however no additional fluid was able to be aspirated due to the thick nature of the fluid. At this time, manual compression was utilized to express an additional approximately 3 cc of similar appearing blood-tinged purulent fluid from the collection with the majority expressed from the pre-existing open wound inferior to the nipple. At this point, the procedure was completed. The patient tolerated the procedure well without immediate postprocedural complication. IMPRESSION: Successful US guided aspiration of approximately 3 cc of blood tinged purulent fluid from complex thick fluid collection/abscess subjacent to the nipple. Aspirated sample was sent to the laboratory as requested by the ordering clinical team. Electronically Signed   By: Sandi Mariscal M.D.   On: 02/25/2021 09:14    Microbiology: Recent Results (from the past 240 hour(s))  SARS CORONAVIRUS 2 (TAT 6-24 HRS) Nasopharyngeal Nasopharyngeal Swab     Status: None   Collection Time: 03/10/21 10:29 AM    Specimen: Nasopharyngeal Swab  Result Value Ref Range Status   SARS Coronavirus 2 NEGATIVE NEGATIVE Final    Comment: (NOTE) SARS-CoV-2 target nucleic acids are NOT DETECTED.  The SARS-CoV-2 RNA is generally detectable in upper and lower respiratory specimens during the acute phase of infection. Negative results do not preclude SARS-CoV-2 infection, do not rule out co-infections with other pathogens, and should not be used as the sole basis for treatment or other patient management decisions. Negative results must be combined with clinical observations, patient history, and epidemiological information. The expected result is Negative.  Fact Sheet for Patients: SugarRoll.be  Fact Sheet for Healthcare Providers: https://www.woods-mathews.com/  This test is not yet approved or cleared by the [place] FDA and  has been authorized for detection and/or diagnosis of SARS-CoV-2 by FDA under an Emergency Use Authorization (EUA). This EUA will remain  in effect (meaning this test can be used) for the duration of the COVID-19 declaration under Se ction 564(b)(1) of the Act,  21 U.S.C. section 360bbb-3(b)(1), unless the authorization is terminated or revoked sooner.  Performed at [facility], [place] [address] [place], [place] [address]      Labs: Basic Metabolic Panel: Recent Labs  Lab 03/11/21 1202 03/12/21 0736 03/14/21 1027 03/15/21 0708 03/17/21 1408  NA 134* 128* 128* 129* 131*  K 3.8 4.5 4.1 4.5 4.7  CL 95* 96* 88* 88* 93*  CO2 '30 23 28 29 27  '$ GLUCOSE 128* 135* 148* 145* 140*  BUN 20 31* 38* 46* 41*  CREATININE 8.34* 10.08* 10.37* 12.54* 12.18*  CALCIUM 10.2 10.2 10.4* 10.7* 10.3  PHOS 4.5 5.2* 4.8* 5.5* 5.5*   Liver Function Tests: Recent Labs  Lab 03/11/21 1202 03/12/21 0736 03/14/21 1027 03/15/21 0708 03/17/21 1408  ALBUMIN 3.1* 3.0* 3.0* 3.1* 3.0*   No results for input(s): LIPASE, AMYLASE in the last 168  hours. No results for input(s): AMMONIA in the last 168 hours. CBC: Recent Labs  Lab 03/11/21 1202 03/12/21 0736 03/14/21 1027 03/15/21 0708 03/17/21 1408  WBC 5.1 5.4 6.3 6.0 5.4  HGB 10.3* 10.2* 9.9* 10.3* 10.3*  HCT 33.7* 32.2* 32.3* 33.7* 33.2*  MCV 100.3* 100.0 99.4 100.3* 99.1  PLT 183 198 202 211 196   Cardiac Enzymes: No results for input(s): CKTOTAL, CKMB, CKMBINDEX, TROPONINI in the last 168 hours. BNP: BNP (last 3 results) Recent Labs    01/18/21 0136 01/19/21 0030 01/20/21 0020  BNP 1,410.7* 1,122.4* 1,383.7*    ProBNP (last 3 results) No results for input(s): PROBNP in the last 8760 hours.  CBG: No results for input(s): GLUCAP in the last 168 hours.     Signed:  Erin Hearing ANP [facility] 03/18/2021, 10:46 AM

## 2021-03-18 NOTE — Progress Notes (Addendum)
Late Entry: Navigator has confirmed transportation with [employer] [facility] through [facility]). Navigator informed DTP CSW/C. Shon Baton and asked that COVID test be ordered, as this is needed for HD clinic admission of new patient.  Navigator met with patient at HD bedside to ensure that she is aware of her new outpatient dialysis plan and answer any questions she may have. Patient was pleasant as usual. She states she is a little nervous about moving to [place], but is staying "nothing my positive." She is eager to be discharged from the hospital. Navigator spoke positively about all interactions I've ever had with the [facility] and wishes her luck in her new beginning. We discussed her plan for transportation and it is not entirely clear what time she will be picked up on Saturday by [facility], but it has been confirmed that she is on their schedule. Navigator informed her of her TTS schedule, 11:00am seat, with 10:45am arrival time, so advised that she be ready really early on her first day and then speak with the Lucianne Lei driver for all future pick up times. She agreed. She thanked Navigator for the support and assistance.   Alphonzo Cruise, [place] [facility] Navigator [phone]

## 2021-03-18 NOTE — Progress Notes (Signed)
DISCHARGE NOTE [facility] to be discharged [facility] home per MD order. Patient verbalized understanding.  Skin clean, dry and intact without evidence of skin break down, no evidence of skin tears noted. IV catheter discontinued intact. Site without signs and symptoms of complications. Dressing and pressure applied. Pt denies pain at the site currently. No complaints noted.  Patient free of lines, drains, and wounds.   Discharge packet assembled. An After Visit Summary (AVS) was printed and given to the EMS personnel. Patient escorted via stretcher and discharged to [facility] via ambulance. Report given to accepting facility; all questions and concerns addressed.   Beatris Ship, RN

## 2021-03-20 DIAGNOSIS — Z79899 Other long term (current) drug therapy: Secondary | ICD-10-CM | POA: Diagnosis not present

## 2021-03-20 DIAGNOSIS — N186 End stage renal disease: Secondary | ICD-10-CM | POA: Diagnosis not present

## 2021-03-20 DIAGNOSIS — Z5181 Encounter for therapeutic drug level monitoring: Secondary | ICD-10-CM | POA: Diagnosis not present

## 2021-03-20 DIAGNOSIS — E785 Hyperlipidemia, unspecified: Secondary | ICD-10-CM | POA: Diagnosis not present

## 2021-03-20 DIAGNOSIS — Z992 Dependence on renal dialysis: Secondary | ICD-10-CM | POA: Diagnosis not present

## 2021-03-22 DIAGNOSIS — Z89432 Acquired absence of left foot: Secondary | ICD-10-CM | POA: Diagnosis not present

## 2021-03-22 DIAGNOSIS — E669 Obesity, unspecified: Secondary | ICD-10-CM | POA: Diagnosis not present

## 2021-03-22 DIAGNOSIS — Z87891 Personal history of nicotine dependence: Secondary | ICD-10-CM | POA: Diagnosis not present

## 2021-03-22 DIAGNOSIS — I739 Peripheral vascular disease, unspecified: Secondary | ICD-10-CM | POA: Diagnosis not present

## 2021-03-22 DIAGNOSIS — T829XXD Unspecified complication of cardiac and vascular prosthetic device, implant and graft, subsequent encounter: Secondary | ICD-10-CM | POA: Diagnosis not present

## 2021-03-22 DIAGNOSIS — H543 Unqualified visual loss, both eyes: Secondary | ICD-10-CM | POA: Diagnosis not present

## 2021-03-22 DIAGNOSIS — N186 End stage renal disease: Secondary | ICD-10-CM | POA: Diagnosis not present

## 2021-03-22 DIAGNOSIS — I120 Hypertensive chronic kidney disease with stage 5 chronic kidney disease or end stage renal disease: Secondary | ICD-10-CM | POA: Diagnosis not present

## 2021-03-22 DIAGNOSIS — I10 Essential (primary) hypertension: Secondary | ICD-10-CM | POA: Diagnosis not present

## 2021-03-22 DIAGNOSIS — D631 Anemia in chronic kidney disease: Secondary | ICD-10-CM | POA: Diagnosis not present

## 2021-03-22 DIAGNOSIS — Z8616 Personal history of COVID-19: Secondary | ICD-10-CM | POA: Diagnosis not present

## 2021-03-22 DIAGNOSIS — N611 Abscess of the breast and nipple: Secondary | ICD-10-CM | POA: Diagnosis not present

## 2021-03-22 DIAGNOSIS — Z6841 Body Mass Index (BMI) 40.0 and over, adult: Secondary | ICD-10-CM | POA: Diagnosis not present

## 2021-03-22 DIAGNOSIS — N2581 Secondary hyperparathyroidism of renal origin: Secondary | ICD-10-CM | POA: Diagnosis not present

## 2021-03-22 DIAGNOSIS — U071 COVID-19: Secondary | ICD-10-CM | POA: Diagnosis not present

## 2021-03-23 DIAGNOSIS — Z992 Dependence on renal dialysis: Secondary | ICD-10-CM | POA: Diagnosis not present

## 2021-03-23 DIAGNOSIS — Z1159 Encounter for screening for other viral diseases: Secondary | ICD-10-CM | POA: Diagnosis not present

## 2021-03-23 DIAGNOSIS — I10 Essential (primary) hypertension: Secondary | ICD-10-CM | POA: Diagnosis not present

## 2021-03-23 DIAGNOSIS — N186 End stage renal disease: Secondary | ICD-10-CM | POA: Diagnosis not present

## 2021-03-23 DIAGNOSIS — D509 Iron deficiency anemia, unspecified: Secondary | ICD-10-CM | POA: Diagnosis not present

## 2021-03-24 DIAGNOSIS — I10 Essential (primary) hypertension: Secondary | ICD-10-CM | POA: Diagnosis not present

## 2021-03-25 DIAGNOSIS — N186 End stage renal disease: Secondary | ICD-10-CM | POA: Diagnosis not present

## 2021-03-25 DIAGNOSIS — I10 Essential (primary) hypertension: Secondary | ICD-10-CM | POA: Diagnosis not present

## 2021-03-25 DIAGNOSIS — Z992 Dependence on renal dialysis: Secondary | ICD-10-CM | POA: Diagnosis not present

## 2021-03-26 DIAGNOSIS — I10 Essential (primary) hypertension: Secondary | ICD-10-CM | POA: Diagnosis not present

## 2021-03-27 DIAGNOSIS — I10 Essential (primary) hypertension: Secondary | ICD-10-CM | POA: Diagnosis not present

## 2021-03-28 DIAGNOSIS — I10 Essential (primary) hypertension: Secondary | ICD-10-CM | POA: Diagnosis not present

## 2021-03-29 DIAGNOSIS — I10 Essential (primary) hypertension: Secondary | ICD-10-CM | POA: Diagnosis not present

## 2021-03-30 DIAGNOSIS — I10 Essential (primary) hypertension: Secondary | ICD-10-CM | POA: Diagnosis not present

## 2021-03-30 DIAGNOSIS — N186 End stage renal disease: Secondary | ICD-10-CM | POA: Diagnosis not present

## 2021-03-30 DIAGNOSIS — N2581 Secondary hyperparathyroidism of renal origin: Secondary | ICD-10-CM | POA: Diagnosis not present

## 2021-03-30 DIAGNOSIS — Z23 Encounter for immunization: Secondary | ICD-10-CM | POA: Diagnosis not present

## 2021-03-30 DIAGNOSIS — Z992 Dependence on renal dialysis: Secondary | ICD-10-CM | POA: Diagnosis not present

## 2021-03-31 DIAGNOSIS — I10 Essential (primary) hypertension: Secondary | ICD-10-CM | POA: Diagnosis not present

## 2021-04-01 DIAGNOSIS — E559 Vitamin D deficiency, unspecified: Secondary | ICD-10-CM | POA: Diagnosis not present

## 2021-04-01 DIAGNOSIS — I10 Essential (primary) hypertension: Secondary | ICD-10-CM | POA: Diagnosis not present

## 2021-04-01 DIAGNOSIS — Z23 Encounter for immunization: Secondary | ICD-10-CM | POA: Diagnosis not present

## 2021-04-01 DIAGNOSIS — Z992 Dependence on renal dialysis: Secondary | ICD-10-CM | POA: Diagnosis not present

## 2021-04-01 DIAGNOSIS — N186 End stage renal disease: Secondary | ICD-10-CM | POA: Diagnosis not present

## 2021-04-02 DIAGNOSIS — I10 Essential (primary) hypertension: Secondary | ICD-10-CM | POA: Diagnosis not present

## 2021-04-03 DIAGNOSIS — Z992 Dependence on renal dialysis: Secondary | ICD-10-CM | POA: Diagnosis not present

## 2021-04-03 DIAGNOSIS — Z23 Encounter for immunization: Secondary | ICD-10-CM | POA: Diagnosis not present

## 2021-04-03 DIAGNOSIS — I10 Essential (primary) hypertension: Secondary | ICD-10-CM | POA: Diagnosis not present

## 2021-04-03 DIAGNOSIS — N186 End stage renal disease: Secondary | ICD-10-CM | POA: Diagnosis not present

## 2021-04-04 DIAGNOSIS — I10 Essential (primary) hypertension: Secondary | ICD-10-CM | POA: Diagnosis not present

## 2021-04-05 DIAGNOSIS — I10 Essential (primary) hypertension: Secondary | ICD-10-CM | POA: Diagnosis not present

## 2021-04-06 DIAGNOSIS — I10 Essential (primary) hypertension: Secondary | ICD-10-CM | POA: Diagnosis not present

## 2021-04-06 DIAGNOSIS — Z992 Dependence on renal dialysis: Secondary | ICD-10-CM | POA: Diagnosis not present

## 2021-04-06 DIAGNOSIS — Z23 Encounter for immunization: Secondary | ICD-10-CM | POA: Diagnosis not present

## 2021-04-06 DIAGNOSIS — N186 End stage renal disease: Secondary | ICD-10-CM | POA: Diagnosis not present

## 2021-04-07 DIAGNOSIS — I10 Essential (primary) hypertension: Secondary | ICD-10-CM | POA: Diagnosis not present

## 2021-04-08 DIAGNOSIS — N186 End stage renal disease: Secondary | ICD-10-CM | POA: Diagnosis not present

## 2021-04-08 DIAGNOSIS — Z992 Dependence on renal dialysis: Secondary | ICD-10-CM | POA: Diagnosis not present

## 2021-04-08 DIAGNOSIS — I10 Essential (primary) hypertension: Secondary | ICD-10-CM | POA: Diagnosis not present

## 2021-04-08 DIAGNOSIS — Z23 Encounter for immunization: Secondary | ICD-10-CM | POA: Diagnosis not present

## 2021-04-09 DIAGNOSIS — I10 Essential (primary) hypertension: Secondary | ICD-10-CM | POA: Diagnosis not present

## 2021-04-10 DIAGNOSIS — I10 Essential (primary) hypertension: Secondary | ICD-10-CM | POA: Diagnosis not present

## 2021-04-11 DIAGNOSIS — I10 Essential (primary) hypertension: Secondary | ICD-10-CM | POA: Diagnosis not present

## 2021-04-12 DIAGNOSIS — I10 Essential (primary) hypertension: Secondary | ICD-10-CM | POA: Diagnosis not present

## 2021-04-13 DIAGNOSIS — I10 Essential (primary) hypertension: Secondary | ICD-10-CM | POA: Diagnosis not present

## 2021-04-14 DIAGNOSIS — I10 Essential (primary) hypertension: Secondary | ICD-10-CM | POA: Diagnosis not present

## 2021-04-15 DIAGNOSIS — N186 End stage renal disease: Secondary | ICD-10-CM | POA: Diagnosis not present

## 2021-04-15 DIAGNOSIS — N2581 Secondary hyperparathyroidism of renal origin: Secondary | ICD-10-CM | POA: Diagnosis not present

## 2021-04-15 DIAGNOSIS — I10 Essential (primary) hypertension: Secondary | ICD-10-CM | POA: Diagnosis not present

## 2021-04-15 DIAGNOSIS — Z23 Encounter for immunization: Secondary | ICD-10-CM | POA: Diagnosis not present

## 2021-04-15 DIAGNOSIS — Z992 Dependence on renal dialysis: Secondary | ICD-10-CM | POA: Diagnosis not present

## 2021-04-16 DIAGNOSIS — I10 Essential (primary) hypertension: Secondary | ICD-10-CM | POA: Diagnosis not present

## 2021-04-17 DIAGNOSIS — Z992 Dependence on renal dialysis: Secondary | ICD-10-CM | POA: Diagnosis not present

## 2021-04-17 DIAGNOSIS — Z23 Encounter for immunization: Secondary | ICD-10-CM | POA: Diagnosis not present

## 2021-04-17 DIAGNOSIS — I10 Essential (primary) hypertension: Secondary | ICD-10-CM | POA: Diagnosis not present

## 2021-04-17 DIAGNOSIS — N186 End stage renal disease: Secondary | ICD-10-CM | POA: Diagnosis not present

## 2021-04-18 DIAGNOSIS — I10 Essential (primary) hypertension: Secondary | ICD-10-CM | POA: Diagnosis not present

## 2021-04-19 DIAGNOSIS — I10 Essential (primary) hypertension: Secondary | ICD-10-CM | POA: Diagnosis not present

## 2021-04-20 DIAGNOSIS — I10 Essential (primary) hypertension: Secondary | ICD-10-CM | POA: Diagnosis not present

## 2021-04-21 DIAGNOSIS — Z23 Encounter for immunization: Secondary | ICD-10-CM | POA: Diagnosis not present

## 2021-04-21 DIAGNOSIS — Z992 Dependence on renal dialysis: Secondary | ICD-10-CM | POA: Diagnosis not present

## 2021-04-21 DIAGNOSIS — N186 End stage renal disease: Secondary | ICD-10-CM | POA: Diagnosis not present

## 2021-04-21 DIAGNOSIS — I10 Essential (primary) hypertension: Secondary | ICD-10-CM | POA: Diagnosis not present

## 2021-04-22 DIAGNOSIS — N760 Acute vaginitis: Secondary | ICD-10-CM | POA: Diagnosis not present

## 2021-04-22 DIAGNOSIS — Z112 Encounter for screening for other bacterial diseases: Secondary | ICD-10-CM | POA: Diagnosis not present

## 2021-04-22 DIAGNOSIS — Z1159 Encounter for screening for other viral diseases: Secondary | ICD-10-CM | POA: Diagnosis not present

## 2021-04-22 DIAGNOSIS — B373 Candidiasis of vulva and vagina: Secondary | ICD-10-CM | POA: Diagnosis not present

## 2021-04-22 DIAGNOSIS — L293 Anogenital pruritus, unspecified: Secondary | ICD-10-CM | POA: Diagnosis not present

## 2021-04-22 DIAGNOSIS — I10 Essential (primary) hypertension: Secondary | ICD-10-CM | POA: Diagnosis not present

## 2021-04-22 DIAGNOSIS — Z118 Encounter for screening for other infectious and parasitic diseases: Secondary | ICD-10-CM | POA: Diagnosis not present

## 2021-04-23 DIAGNOSIS — E46 Unspecified protein-calorie malnutrition: Secondary | ICD-10-CM | POA: Diagnosis not present

## 2021-04-23 DIAGNOSIS — I10 Essential (primary) hypertension: Secondary | ICD-10-CM | POA: Diagnosis not present

## 2021-04-23 DIAGNOSIS — Z23 Encounter for immunization: Secondary | ICD-10-CM | POA: Diagnosis not present

## 2021-04-23 DIAGNOSIS — N186 End stage renal disease: Secondary | ICD-10-CM | POA: Diagnosis not present

## 2021-04-23 DIAGNOSIS — Z992 Dependence on renal dialysis: Secondary | ICD-10-CM | POA: Diagnosis not present

## 2021-04-25 DIAGNOSIS — I10 Essential (primary) hypertension: Secondary | ICD-10-CM | POA: Diagnosis not present

## 2021-04-25 DIAGNOSIS — R109 Unspecified abdominal pain: Secondary | ICD-10-CM | POA: Diagnosis not present

## 2021-04-25 DIAGNOSIS — K529 Noninfective gastroenteritis and colitis, unspecified: Secondary | ICD-10-CM | POA: Diagnosis not present

## 2021-04-25 DIAGNOSIS — R1084 Generalized abdominal pain: Secondary | ICD-10-CM | POA: Diagnosis not present

## 2021-04-25 DIAGNOSIS — R1032 Left lower quadrant pain: Secondary | ICD-10-CM | POA: Diagnosis not present

## 2021-04-25 DIAGNOSIS — N611 Abscess of the breast and nipple: Secondary | ICD-10-CM | POA: Diagnosis not present

## 2021-04-25 DIAGNOSIS — I700 Atherosclerosis of aorta: Secondary | ICD-10-CM | POA: Diagnosis not present

## 2021-04-26 DIAGNOSIS — I10 Essential (primary) hypertension: Secondary | ICD-10-CM | POA: Diagnosis not present

## 2021-04-27 DIAGNOSIS — I10 Essential (primary) hypertension: Secondary | ICD-10-CM | POA: Diagnosis not present

## 2021-04-28 DIAGNOSIS — Z992 Dependence on renal dialysis: Secondary | ICD-10-CM | POA: Diagnosis not present

## 2021-04-28 DIAGNOSIS — N2581 Secondary hyperparathyroidism of renal origin: Secondary | ICD-10-CM | POA: Diagnosis not present

## 2021-04-28 DIAGNOSIS — I10 Essential (primary) hypertension: Secondary | ICD-10-CM | POA: Diagnosis not present

## 2021-04-28 DIAGNOSIS — Z23 Encounter for immunization: Secondary | ICD-10-CM | POA: Diagnosis not present

## 2021-04-28 DIAGNOSIS — N186 End stage renal disease: Secondary | ICD-10-CM | POA: Diagnosis not present

## 2021-04-29 DIAGNOSIS — I10 Essential (primary) hypertension: Secondary | ICD-10-CM | POA: Diagnosis not present

## 2021-04-30 DIAGNOSIS — N186 End stage renal disease: Secondary | ICD-10-CM | POA: Diagnosis not present

## 2021-04-30 DIAGNOSIS — Z23 Encounter for immunization: Secondary | ICD-10-CM | POA: Diagnosis not present

## 2021-04-30 DIAGNOSIS — Z992 Dependence on renal dialysis: Secondary | ICD-10-CM | POA: Diagnosis not present

## 2021-04-30 DIAGNOSIS — E559 Vitamin D deficiency, unspecified: Secondary | ICD-10-CM | POA: Diagnosis not present

## 2021-04-30 DIAGNOSIS — I10 Essential (primary) hypertension: Secondary | ICD-10-CM | POA: Diagnosis not present

## 2021-04-30 DIAGNOSIS — N2581 Secondary hyperparathyroidism of renal origin: Secondary | ICD-10-CM | POA: Diagnosis not present

## 2021-05-01 DIAGNOSIS — I10 Essential (primary) hypertension: Secondary | ICD-10-CM | POA: Diagnosis not present

## 2021-05-02 DIAGNOSIS — I10 Essential (primary) hypertension: Secondary | ICD-10-CM | POA: Diagnosis not present

## 2021-05-03 DIAGNOSIS — I10 Essential (primary) hypertension: Secondary | ICD-10-CM | POA: Diagnosis not present

## 2021-05-04 ENCOUNTER — Emergency Department (HOSPITAL_COMMUNITY): Payer: Medicare HMO

## 2021-05-04 ENCOUNTER — Other Ambulatory Visit: Payer: Self-pay

## 2021-05-04 ENCOUNTER — Encounter (HOSPITAL_COMMUNITY): Payer: Self-pay | Admitting: Emergency Medicine

## 2021-05-04 ENCOUNTER — Emergency Department (HOSPITAL_COMMUNITY)
Admission: EM | Admit: 2021-05-04 | Discharge: 2021-05-05 | Disposition: A | Discharge status: Home / Self Care | Payer: Medicare HMO | Attending: Emergency Medicine | Admitting: Emergency Medicine

## 2021-05-04 DIAGNOSIS — I10 Essential (primary) hypertension: Secondary | ICD-10-CM | POA: Diagnosis not present

## 2021-05-04 DIAGNOSIS — Z743 Need for continuous supervision: Secondary | ICD-10-CM | POA: Diagnosis not present

## 2021-05-04 DIAGNOSIS — Z992 Dependence on renal dialysis: Secondary | ICD-10-CM | POA: Insufficient documentation

## 2021-05-04 DIAGNOSIS — N186 End stage renal disease: Secondary | ICD-10-CM | POA: Insufficient documentation

## 2021-05-04 DIAGNOSIS — Z20822 Contact with and (suspected) exposure to covid-19: Secondary | ICD-10-CM | POA: Insufficient documentation

## 2021-05-04 DIAGNOSIS — Z7982 Long term (current) use of aspirin: Secondary | ICD-10-CM | POA: Diagnosis not present

## 2021-05-04 DIAGNOSIS — Z87891 Personal history of nicotine dependence: Secondary | ICD-10-CM | POA: Insufficient documentation

## 2021-05-04 DIAGNOSIS — J189 Pneumonia, unspecified organism: Secondary | ICD-10-CM | POA: Insufficient documentation

## 2021-05-04 DIAGNOSIS — Z8616 Personal history of COVID-19: Secondary | ICD-10-CM | POA: Diagnosis not present

## 2021-05-04 DIAGNOSIS — R0602 Shortness of breath: Secondary | ICD-10-CM | POA: Diagnosis not present

## 2021-05-04 DIAGNOSIS — I120 Hypertensive chronic kidney disease with stage 5 chronic kidney disease or end stage renal disease: Secondary | ICD-10-CM | POA: Diagnosis not present

## 2021-05-04 DIAGNOSIS — Z79899 Other long term (current) drug therapy: Secondary | ICD-10-CM | POA: Diagnosis not present

## 2021-05-04 LAB — CBC WITH DIFFERENTIAL/PLATELET
Abs Immature Granulocytes: 0.03 10*3/uL (ref 0.00–0.07)
Basophils Absolute: 0 10*3/uL (ref 0.0–0.1)
Basophils Relative: 1 %
Eosinophils Absolute: 0.3 10*3/uL (ref 0.0–0.5)
Eosinophils Relative: 5 %
HCT: 26 % — ABNORMAL LOW (ref 36.0–46.0)
Hemoglobin: 8.1 g/dL — ABNORMAL LOW (ref 12.0–15.0)
Immature Granulocytes: 1 %
Lymphocytes Relative: 14 %
Lymphs Abs: 0.8 10*3/uL (ref 0.7–4.0)
MCH: 30.5 pg (ref 26.0–34.0)
MCHC: 31.2 g/dL (ref 30.0–36.0)
MCV: 97.7 fL (ref 80.0–100.0)
Monocytes Absolute: 0.4 10*3/uL (ref 0.1–1.0)
Monocytes Relative: 7 %
Neutro Abs: 4.1 10*3/uL (ref 1.7–7.7)
Neutrophils Relative %: 72 %
Platelets: 211 10*3/uL (ref 150–400)
RBC: 2.66 MIL/uL — ABNORMAL LOW (ref 3.87–5.11)
RDW: 15 % (ref 11.5–15.5)
WBC: 5.6 10*3/uL (ref 4.0–10.5)
nRBC: 0 % (ref 0.0–0.2)

## 2021-05-04 LAB — COMPREHENSIVE METABOLIC PANEL
ALT: 10 U/L (ref 0–44)
AST: 10 U/L — ABNORMAL LOW (ref 15–41)
Albumin: 3.6 g/dL (ref 3.5–5.0)
Alkaline Phosphatase: 67 U/L (ref 38–126)
Anion gap: 14 (ref 5–15)
BUN: 67 mg/dL — ABNORMAL HIGH (ref 6–20)
CO2: 27 mmol/L (ref 22–32)
Calcium: 9.8 mg/dL (ref 8.9–10.3)
Chloride: 95 mmol/L — ABNORMAL LOW (ref 98–111)
Creatinine, Ser: 19.24 mg/dL — ABNORMAL HIGH (ref 0.44–1.00)
GFR, Estimated: 2 mL/min — ABNORMAL LOW (ref >60)
Glucose, Bld: 112 mg/dL — ABNORMAL HIGH (ref 70–99)
Potassium: 4.7 mmol/L (ref 3.5–5.1)
Sodium: 136 mmol/L (ref 135–145)
Total Bilirubin: 0.9 mg/dL (ref 0.3–1.2)
Total Protein: 7.3 g/dL (ref 6.5–8.1)

## 2021-05-04 LAB — MAGNESIUM: Magnesium: 2.1 mg/dL (ref 1.7–2.4)

## 2021-05-04 LAB — PHOSPHORUS: Phosphorus: 8.1 mg/dL — ABNORMAL HIGH (ref 2.5–4.6)

## 2021-05-04 MED ORDER — IOHEXOL 350 MG/ML SOLN
100.0000 mL | Freq: Once | INTRAVENOUS | Status: AC | PRN
  Administered 2021-05-04: 75 mL via INTRAVENOUS

## 2021-05-04 MED ORDER — ALBUTEROL SULFATE HFA 108 (90 BASE) MCG/ACT IN AERS
4.0000 | INHALATION_SPRAY | RESPIRATORY_TRACT | Status: AC
  Administered 2021-05-04: 4 via RESPIRATORY_TRACT
  Filled 2021-05-04: qty 6.7

## 2021-05-04 NOTE — ED Triage Notes (Signed)
Pt from group home. Pt c/o of sob x 2 days. Has not had dialysis since Friday due to congestion. 94% on RA. Pt hypertensive

## 2021-05-04 NOTE — ED Notes (Signed)
Unable to sign waiver. Pt is blind

## 2021-05-04 NOTE — ED Notes (Signed)
Pt transported to CT at this time.

## 2021-05-04 NOTE — ED Provider Notes (Signed)
[facility] EMERGENCY DEPARTMENT Provider Note   CSN: [id number] Arrival date & time: 05/04/21  1713     History Chief Complaint  Patient presents with  . Shortness of Breath    Rebekah Burton is a 45 y.o. female.  HPI    45 year old female with history of ESRD on HD, PAD, hypertension comes in a chief complaint of shortness of breath.  Patient missed her hemodialysis this morning.  She reports that she started feeling short of breath earlier today.  She has some chest discomfort, but no chest pain.  Patient does not think it is volume overload.  She denies any new cough, fevers, chills, COVID-19 exposures.  Patient does not have any underlying lung disease or cardiac issues.  No history of PE, DVT.  She did have COVID-19 last year.  Past Medical History:  Diagnosis Date  . Blind   . ESRD (end stage renal disease) ([place])   . Foot ulceration ([place]) 12/2018  . Hypertension   . PAD (peripheral artery disease) [facility])     Patient Active Problem List   Diagnosis Date Noted  . Complication of vascular access for dialysis   . Poor social situation   . Left breast abscess   . Breast abscess 01/15/2021  . COVID-19 virus infection 01/15/2021  . Breast abscess of female 01/15/2021  . Cellulitis of left lower extremity 02/05/2019  . Post-operative pain   . Acute blood loss anemia   . S/P transmetatarsal amputation of foot, left ([place])   . Ischemic ulcer of left foot ([place]) 01/16/2019  . ESRD (end stage renal disease) ([place]) 01/16/2019  . HTN (hypertension) 01/16/2019  . 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  . PAD (peripheral artery disease) ([place]) 01/16/2019    Past Surgical History:  Procedure Laterality Date  . ABDOMINAL AORTOGRAM W/LOWER EXTREMITY Bilateral 01/17/2019   Procedure: ABDOMINAL AORTOGRAM W/LOWER EXTREMITY;  Surgeon: Waynetta Sandy, MD;  Location: [place] [facility];  Service: Cardiovascular;  Laterality: Bilateral;  . ABDOMINAL HYSTERECTOMY    . ENDARTERECTOMY  FEMORAL Left 01/18/2019   Procedure: ENDARTERECTOMY FEMORAL with Perfundaplasty;  Surgeon: Rosetta Posner, MD;  Location: [place];  Service: Vascular;  Laterality: Left;  . FEMORAL-POPLITEAL BYPASS GRAFT Left 01/18/2019   Procedure: Left  FEMORAL- to  Below the Knee POPLITEAL ARTERY bypass using reversed safenous vein.;  Surgeon: Rosetta Posner, MD;  Location: [place];  Service: Vascular;  Laterality: Left;  . INCISION AND DRAINAGE ABSCESS Left 01/15/2021   Procedure: INCISION AND DRAINAGE BREAST ABSCESS;  Surgeon: Dwan Bolt, MD;  Location: [place];  Service: General;  Laterality: Left;  . INCISION AND DRAINAGE ABSCESS Left 02/25/2021   Procedure: INCISION AND DRAINAGE BREAST  ABSCESS;  Surgeon: Dwan Bolt, MD;  Location: [place];  Service: General;  Laterality: Left;  . IR DIALY SHUNT INTRO [place] W/IMG LEFT Left 01/28/2021  . IR THROMBECTOMY AV FISTULA W/THROMBOLYSIS/PTA/STENT INC/SHUNT/IMG LT Left 10/23/2020  . IR US GUIDE BX ASP/DRAIN  01/28/2021  . IR US GUIDE VASC ACCESS LEFT  01/26/2021  . toe removal Right    All toes on right foot have been removed.   . TRANSMETATARSAL AMPUTATION Left 01/18/2019   Procedure: TRANSMETATARSAL AMPUTATION;  Surgeon: Rosetta Posner, MD;  Location: [facility] OR;  Service: Vascular;  Laterality: Left;     OB History   No obstetric history on file.     Family History  Problem Relation Age of Onset  . Peripheral Artery Disease Neg Hx   .  Kidney failure Neg Hx     Social History   Tobacco Use  . Smoking status: Former Smoker    Packs/day: 0.50    Years: 20.00    Pack years: 10.00    Types: Cigarettes    Quit date: 01/14/2019    Years since quitting: 2.3  . Smokeless tobacco: Never Used  . Tobacco comment: <1 PPD  Vaping Use  . Vaping Use: Never used  Substance Use Topics  . Alcohol use: Never  . Drug use: Never    Home Medications Prior to Admission medications   Medication Sig Start Date End Date Taking? Authorizing Provider   amoxicillin (AMOXIL) 500 MG capsule Take 500 mg by mouth every 8 (eight) hours. 04/28/21  Yes [provider]  b complex-vitamin c-folic acid (NEPHRO-VITE) 0.8 MG TABS tablet Take 1 tablet by mouth every Monday, Wednesday, and Friday with hemodialysis. 03/17/21  Yes Samella Parr, NP  carvedilol (COREG) 25 MG tablet Take 1 tablet (25 mg total) by mouth 2 (two) times daily. 03/17/21  Yes Samella Parr, NP  cloNIDine (CATAPRES) 0.3 MG tablet Take 1 tablet (0.3 mg total) by mouth 3 (three) times daily. 03/17/21  Yes Samella Parr, NP  ibuprofen (ADVIL) 600 MG tablet Take 1 tablet (600 mg total) by mouth 3 (three) times daily. 03/17/21  Yes Samella Parr, NP  lanthanum (FOSRENOL) 1000 MG chewable tablet Chew 1 tablet (1,000 mg total) by mouth 3 (three) times daily with meals. 03/18/21  Yes Samella Parr, NP  ondansetron (ZOFRAN-ODT) 4 MG disintegrating tablet Take by mouth. 04/26/21  Yes [provider]  pantoprazole (PROTONIX) 40 MG tablet Take 1 tablet (40 mg total) by mouth daily. 03/17/21  Yes Samella Parr, NP  senna (SENOKOT) 8.6 MG TABS tablet Take 1 tablet (8.6 mg total) by mouth 2 (two) times daily. 03/17/21  Yes Samella Parr, NP  traZODone (DESYREL) 150 MG tablet Take 150 mg by mouth at bedtime. 05/01/21  Yes [provider]  acetaminophen (TYLENOL) 325 MG tablet Take 2 tablets (650 mg total) by mouth every 6 (six) hours as needed for mild pain (or Fever >/= 101). Patient not taking: No sig reported 03/08/21   Samella Parr, NP  aspirin 81 MG EC tablet Take 1 tablet (81 mg total) by mouth daily. Patient not taking: No sig reported 03/08/21   Samella Parr, NP  B Complex Vitamins (VITAMIN B COMPLEX) TABS TAKE 1 TABLET BY MOUTH EVERY MONDAY, WEDNESDAY, AND FRIDAY WITH HEMODIALYSIS. Patient not taking: Reported on 05/04/2021 03/17/21 03/17/22  Samella Parr, NP  carvedilol (COREG) 25 MG tablet TAKE 1 TABLET (25 MG TOTAL) BY MOUTH TWO TIMES DAILY. Patient  not taking: No sig reported 03/17/21 03/17/22  Samella Parr, NP  cloNIDine (CATAPRES) 0.3 MG tablet TAKE 1 TABLET (0.3 MG TOTAL) BY MOUTH THREE TIMES DAILY. Patient not taking: No sig reported 03/17/21 03/17/22  Samella Parr, NP  ibuprofen (ADVIL) 600 MG tablet TAKE 1 TABLET (600 MG TOTAL) BY MOUTH THREE TIMES DAILY. Patient not taking: No sig reported 03/17/21 03/17/22  Samella Parr, NP  lanthanum (FOSRENOL) 1000 MG chewable tablet CHEW AND SWALLOW 1 TABLET (1,000 MG TOTAL) BY MOUTH THREE TIMES DAILY WITH MEALS. Patient not taking: No sig reported 03/18/21 03/18/22  Samella Parr, NP  lidocaine-prilocaine (EMLA) cream Apply 1 application topically every Monday, Wednesday, and Friday with hemodialysis.  Patient not taking: No sig reported 11/20/18   [provider]  oxyCODONE (ROXICODONE) 15 MG immediate release tablet TAKE 1 TABLET (15 MG TOTAL) BY MOUTH EVERY 4 (FOUR) HOURS AS NEEDED FOR MODERATE PAIN. Patient not taking: No sig reported 03/08/21 09/04/21  Samella Parr, NP  oxyCODONE 10 MG TABS Take 1 tablet (10 mg total) by mouth every 4 (four) hours as needed for moderate pain. Patient not taking: No sig reported 03/17/21   Samella Parr, NP  Oxycodone HCl 10 MG TABS TAKE 1 TABLET (10 MG TOTAL) BY MOUTH EVERY FOUR HOURS AS NEEDED FOR MODERATE PAIN. Patient not taking: No sig reported 03/17/21 09/13/21  Samella Parr, NP  pantoprazole (PROTONIX) 40 MG tablet TAKE 1 TABLET (40 MG TOTAL) BY MOUTH DAILY. Patient not taking: No sig reported 03/17/21 03/17/22  Samella Parr, NP  pregabalin (LYRICA) 75 MG capsule Take 1 capsule (75 mg total) by mouth at bedtime. Patient not taking: No sig reported 03/17/21   Samella Parr, NP  pregabalin (LYRICA) 75 MG capsule TAKE 1 CAPSULE (75 MG TOTAL) BY MOUTH AT BEDTIME. Patient not taking: No sig reported 03/17/21 09/13/21  Samella Parr, NP  pregabalin (LYRICA) 75 MG capsule TAKE 1 CAPSULE (75 MG TOTAL) BY MOUTH AT BEDTIME. Patient  not taking: No sig reported 03/08/21 09/04/21  Samella Parr, NP  senna (SENOKOT) 8.6 MG TABS tablet TAKE 1 TABLET (8.6 MG TOTAL) BY MOUTH 2 (TWO) TIMES DAILY. Patient not taking: No sig reported 03/17/21 03/17/22  Samella Parr, NP  senna (SENOKOT) 8.6 MG TABS tablet TAKE 1 TABLET (8.6 MG TOTAL) BY MOUTH 2 (TWO) TIMES DAILY. Patient not taking: No sig reported 03/08/21 03/08/22  Samella Parr, NP  sevelamer carbonate (RENVELA) 800 MG tablet TAKE 4 TABLETS (3,200 MG TOTAL) BY MOUTH THREE TIMES DAILY WITH MEALS. Patient not taking: No sig reported 03/17/21 03/17/22  Samella Parr, NP    Allergies    Percocet [oxycodone-acetaminophen]  Review of Systems   Review of Systems  Constitutional: Positive for activity change.  Respiratory: Positive for shortness of breath.   Cardiovascular: Negative for chest pain.  Gastrointestinal: Negative for nausea and vomiting.  Neurological: Negative for dizziness.  All other systems reviewed and are negative.   Physical Exam Updated Vital Signs BP (!) 154/117 (BP Location: Right Leg)   Pulse 81   Resp 19   Ht '5\' 4"'$  (1.626 m)   Wt 99.8 kg   SpO2 97%   BMI 37.76 kg/m   Physical Exam Vitals and nursing note reviewed.  Constitutional:      Appearance: She is well-developed.  HENT:     Head: Normocephalic and atraumatic.  Cardiovascular:     Rate and Rhythm: Normal rate.  Pulmonary:     Effort: Pulmonary effort is normal.     Breath sounds: No decreased breath sounds, wheezing, rhonchi or rales.  Abdominal:     General: Bowel sounds are normal.  Musculoskeletal:     Cervical back: Normal range of motion and neck supple.     Right lower leg: No tenderness. No edema.     Left lower leg: No tenderness. No edema.  Skin:    General: Skin is warm and dry.  Neurological:     Mental Status: She is alert and oriented to person, place, and time.     ED Results / Procedures / Treatments   Labs (all labs ordered are listed, but only  abnormal results are displayed) Labs Reviewed  COMPREHENSIVE METABOLIC PANEL - Abnormal; Notable  for the following components:      Result Value   Chloride 95 (*)    Glucose, Bld 112 (*)    BUN 67 (*)    Creatinine, Ser 19.24 (*)    AST 10 (*)    GFR, Estimated 2 (*)    All other components within normal limits  CBC WITH DIFFERENTIAL/PLATELET - Abnormal; Notable for the following components:   RBC 2.66 (*)    Hemoglobin 8.1 (*)    HCT 26.0 (*)    All other components within normal limits  PHOSPHORUS - Abnormal; Notable for the following components:   Phosphorus 8.1 (*)    All other components within normal limits  RESP PANEL BY RT-PCR (FLU A&B, COVID) ARPGX2  MAGNESIUM  I-STAT BETA HCG BLOOD, ED ([facility], [facility], [facility] ONLY)    EKG EKG Interpretation  Date/Time:  Tuesday May 04 2021 17:22:44 EDT Ventricular Rate:  79 PR Interval:  138 QRS Duration: 81 QT Interval:  402 QTC Calculation: 461 R Axis:   -17 Text Interpretation: Sinus rhythm Borderline left axis deviation Low voltage, precordial leads Consider anterior infarct No acute changes No significant change since last tracing Confirmed by Varney Biles [phone]) on 05/04/2021 6:24:20 PM   Radiology CT Angio Chest PE W and/or Wo Contrast  Result Date: 05/04/2021 CLINICAL DATA:  sob x 2 days. Has not had dialysis since Friday due to congestion. ESRD x 13 years. EXAM: CT ANGIOGRAPHY CHEST WITH CONTRAST TECHNIQUE: Multidetector CT imaging of the chest was performed using the standard protocol during bolus administration of intravenous contrast. Multiplanar CT image reconstructions and MIPs were obtained to evaluate the vascular anatomy. CONTRAST:  74m OMNIPAQUE IOHEXOL 350 MG/ML SOLN COMPARISON:  Ultrasound breast 02/24/2021 FINDINGS: Cardiovascular: Satisfactory opacification of the pulmonary arteries to the segmental level. No evidence of pulmonary embolism. The main pulmonary artery is enlarged measuring up to 4.1 cm. Normal heart size.  No significant pericardial effusion. The thoracic aorta is normal in caliber. Mild-to-moderate atherosclerotic plaque of the thoracic aorta. Four-vessel coronary artery calcifications. Mediastinum/Nodes: Right paratracheal enlarged lymph nodes 3: 1.5 x 1.4 cm (4:26). Multiple enlarged left axillary lymph nodes (5:107-153). No enlarged hilar lymph nodes. Thyroid gland, trachea, and esophagus demonstrate no significant findings. Lungs/Pleura: Expiratory phase of respiration. Right lower lobe 2cm peripheral ground-glass airspace opacity. Peripheral ground-glass airspace opacities. Bilateral trace pleural effusions. No pneumothorax. Upper Abdomen: No acute abnormality. Musculoskeletal: Subcutaneus soft tissue edema of the left breast. Multiple venous collaterals within the chest wall. No lytic or blastic osseous lesions. No acute displaced fracture. Multilevel degenerative changes of the spine. Review of the MIP images confirms the above findings. IMPRESSION: 1. No pulmonary embolus. 2. Enlarged main pulmonary artery consistent with pulmonary hypertension. 3. Bilateral trace pleural effusions. 4. Question COVID-19 infection in the setting of peripheral ground-glass airspace opacities. 5. Mediastinal lymphadenopathy of unclear etiology. Finding could be reactive in etiology. Recommend attention on follow-up 6. Left axillary lymphadenopathy with associated left subcutaneus soft tissue edema. Given lymphadenopathy, correlate with mammography. 7. Aortic Atherosclerosis (ICD10-I70.0) - including four-vessel coronary calcification. Electronically Signed   By: MIven FinnM.D.   On: 05/04/2021 22:57   DG Chest Port 1 View  Result Date: 05/04/2021 CLINICAL DATA:  Shortness of breath EXAM: PORTABLE CHEST 1 VIEW COMPARISON:  01/16/2021 FINDINGS: Cardiac shadow remains enlarged. Aortic calcifications are again noted and stable. Lungs are well aerated bilaterally with mild vascular congestion. No interstitial edema or focal  infiltrate is noted. No bony abnormality is seen. IMPRESSION: Mild  vascular congestion without interstitial edema. Electronically Signed   By: Inez Catalina M.D.   On: 05/04/2021 18:36    Procedures Procedures   Medications Ordered in ED Medications  albuterol (VENTOLIN HFA) 108 (90 Base) MCG/ACT inhaler 4 puff (4 puffs Inhalation Given 05/04/21 2309)  iohexol (OMNIPAQUE) 350 MG/ML injection 100 mL (75 mLs Intravenous Contrast Given 05/04/21 2211)    ED Course  I have reviewed the triage vital signs and the nursing notes.  Pertinent labs & imaging results that were available during my care of the patient were reviewed by me and considered in my medical decision making (see chart for details).    MDM Rules/Calculators/A&P                          45 year old female comes in with chief complaint of shortness of breath.  She has history of ESRD on HD, last hemodialysis was on Friday.  She is reporting shortness of breath.  Patient is noted to be slightly tachypneic, but her lung exam is clear and there is no overt sign of volume overload.  Chest x-ray, troponins are reassuring.  Patient reassessed, reports that she is having pain with inspiration and some back pain.  She had recent COVID-19 and therefore we proceeded with CT angiogram to rule out PE, and it is negative.  COVID-19 retest is pending.  Signing out patient's care to incoming team.  At the time of signout, it was noticed that patient's hemoglobin is 8, about 2 g lower than her baseline normal. Dr. Sedonia Small will reassess for GI loss leading to anemia.  I will send anemia panel as well.  Final Clinical Impression(s) / ED Diagnoses Final diagnoses:  None    Rx / DC Orders ED Discharge Orders    None       Varney Biles, MD 05/05/21 0001

## 2021-05-04 NOTE — ED Notes (Signed)
Pt ambulated around bed in room. Pt became more SOB while ambulating. O2 remained 98-100% before, during and after ambulation trial.  Pt repositioned back in bed for comfort and appears to be in NAD. Will continue to monitor.

## 2021-05-05 DIAGNOSIS — J189 Pneumonia, unspecified organism: Secondary | ICD-10-CM | POA: Diagnosis not present

## 2021-05-05 DIAGNOSIS — I10 Essential (primary) hypertension: Secondary | ICD-10-CM | POA: Diagnosis not present

## 2021-05-05 LAB — IRON AND TIBC
Iron: 45 ug/dL (ref 28–170)
Saturation Ratios: 21 % (ref 10.4–31.8)
TIBC: 213 ug/dL — ABNORMAL LOW (ref 250–450)
UIBC: 168 ug/dL

## 2021-05-05 LAB — FERRITIN: Ferritin: 1701 ng/mL — ABNORMAL HIGH (ref 11–307)

## 2021-05-05 LAB — RETICULOCYTES
Immature Retic Fract: 10.5 % (ref 2.3–15.9)
RBC.: 2.75 MIL/uL — ABNORMAL LOW (ref 3.87–5.11)
Retic Count, Absolute: 34.9 10*3/uL (ref 19.0–186.0)
Retic Ct Pct: 1.3 % (ref 0.4–3.1)

## 2021-05-05 LAB — RESP PANEL BY RT-PCR (FLU A&B, COVID) ARPGX2
Influenza A by PCR: NEGATIVE
Influenza B by PCR: NEGATIVE
SARS Coronavirus 2 by RT PCR: NEGATIVE

## 2021-05-05 LAB — VITAMIN B12: Vitamin B-12: 755 pg/mL (ref 180–914)

## 2021-05-05 LAB — FOLATE: Folate: 14.4 ng/mL (ref >5.9)

## 2021-05-05 MED ORDER — AZITHROMYCIN 250 MG PO TABS: ORAL_TABLET | ORAL | 0 refills | Status: DC

## 2021-05-05 NOTE — ED Notes (Addendum)
This RN assisted Dr. Sedonia Small with rectal exam. Pt tolerated well and appeared to be in NAD. Hemoccult NEGATIVE.

## 2021-05-05 NOTE — Discharge Instructions (Addendum)
You were evaluated in the Emergency Department and after careful evaluation, we did not find any emergent condition requiring admission or further testing in the hospital.  Your exam/testing today was overall reassuring.  Your COVID test was negative.  Your symptoms may be due to pneumonia.  Please continue taking your amoxicillin at home.  Please also take the azithromycin antibiotic as prescribed.  Please follow-up with your primary care doctor to discuss your anemia.  Please return to the Emergency Department if you experience any worsening of your condition.  Thank you for allowing [place] to be a part of your care.

## 2021-05-05 NOTE — ED Provider Notes (Signed)
  Provider Note MRN:  [id number]  Arrival date & time: 05/05/21    ED Course and Medical Decision Making  Assumed care from Dr. Kathrynn Humble at shift change.  Shortness of breath, CTA is negative, hemoglobin has down trended a bit and, pending Hemoccult and repeat ambulatory assessment with pulse ox.  Anticipating potential discharge.  On reassessment patient is sitting comfortably, normal vital signs, no hypoxia, no increased work of breathing, lungs are clear.  COVID test is negative.  Given the groundglass opacity findings on CT, will treat for pneumonia with azithromycin.  Hemoccult was negative for blood, advised to follow-up with PCP regarding her anemia.  Ambulating without issue.  Procedures  Final Clinical Impressions(s) / ED Diagnoses     ICD-10-CM   1. Community acquired pneumonia, unspecified laterality  J18.9     ED Discharge Orders         Ordered    azithromycin (ZITHROMAX) 250 MG tablet        05/05/21 0049            Discharge Instructions     You were evaluated in the Emergency Department and after careful evaluation, we did not find any emergent condition requiring admission or further testing in the hospital.  Your exam/testing today was overall reassuring.  Your COVID test was negative.  Your symptoms may be due to pneumonia.  Please continue taking your amoxicillin at home.  Please also take the azithromycin antibiotic as prescribed.  Please follow-up with your primary care doctor to discuss your anemia.  Please return to the Emergency Department if you experience any worsening of your condition.  Thank you for allowing [place] to be a part of your care.      Barth Kirks. Sedonia Small, [place] [email or web address]    Maudie Flakes, MD 05/05/21 [phone]

## 2021-05-05 NOTE — ED Notes (Signed)
Facility was notified that pt was being discharged and transported back at this time. All paperwork given to patient.

## 2021-05-06 DIAGNOSIS — I10 Essential (primary) hypertension: Secondary | ICD-10-CM | POA: Diagnosis not present

## 2021-05-07 DIAGNOSIS — Z23 Encounter for immunization: Secondary | ICD-10-CM | POA: Diagnosis not present

## 2021-05-07 DIAGNOSIS — Z992 Dependence on renal dialysis: Secondary | ICD-10-CM | POA: Diagnosis not present

## 2021-05-07 DIAGNOSIS — I10 Essential (primary) hypertension: Secondary | ICD-10-CM | POA: Diagnosis not present

## 2021-05-07 DIAGNOSIS — N2581 Secondary hyperparathyroidism of renal origin: Secondary | ICD-10-CM | POA: Diagnosis not present

## 2021-05-07 DIAGNOSIS — N186 End stage renal disease: Secondary | ICD-10-CM | POA: Diagnosis not present

## 2021-05-08 DIAGNOSIS — I10 Essential (primary) hypertension: Secondary | ICD-10-CM | POA: Diagnosis not present

## 2021-05-09 DIAGNOSIS — I10 Essential (primary) hypertension: Secondary | ICD-10-CM | POA: Diagnosis not present

## 2021-05-10 DIAGNOSIS — N2581 Secondary hyperparathyroidism of renal origin: Secondary | ICD-10-CM | POA: Diagnosis not present

## 2021-05-10 DIAGNOSIS — I10 Essential (primary) hypertension: Secondary | ICD-10-CM | POA: Diagnosis not present

## 2021-05-10 DIAGNOSIS — Z23 Encounter for immunization: Secondary | ICD-10-CM | POA: Diagnosis not present

## 2021-05-10 DIAGNOSIS — Z992 Dependence on renal dialysis: Secondary | ICD-10-CM | POA: Diagnosis not present

## 2021-05-10 DIAGNOSIS — N186 End stage renal disease: Secondary | ICD-10-CM | POA: Diagnosis not present

## 2021-05-11 DIAGNOSIS — I10 Essential (primary) hypertension: Secondary | ICD-10-CM | POA: Diagnosis not present

## 2021-05-12 DIAGNOSIS — N2581 Secondary hyperparathyroidism of renal origin: Secondary | ICD-10-CM | POA: Diagnosis not present

## 2021-05-12 DIAGNOSIS — Z23 Encounter for immunization: Secondary | ICD-10-CM | POA: Diagnosis not present

## 2021-05-12 DIAGNOSIS — N186 End stage renal disease: Secondary | ICD-10-CM | POA: Diagnosis not present

## 2021-05-12 DIAGNOSIS — Z992 Dependence on renal dialysis: Secondary | ICD-10-CM | POA: Diagnosis not present

## 2021-05-12 DIAGNOSIS — I10 Essential (primary) hypertension: Secondary | ICD-10-CM | POA: Diagnosis not present

## 2021-05-13 DIAGNOSIS — I10 Essential (primary) hypertension: Secondary | ICD-10-CM | POA: Diagnosis not present

## 2021-05-14 DIAGNOSIS — N2581 Secondary hyperparathyroidism of renal origin: Secondary | ICD-10-CM | POA: Diagnosis not present

## 2021-05-14 DIAGNOSIS — N186 End stage renal disease: Secondary | ICD-10-CM | POA: Diagnosis not present

## 2021-05-14 DIAGNOSIS — Z23 Encounter for immunization: Secondary | ICD-10-CM | POA: Diagnosis not present

## 2021-05-14 DIAGNOSIS — I10 Essential (primary) hypertension: Secondary | ICD-10-CM | POA: Diagnosis not present

## 2021-05-14 DIAGNOSIS — Z992 Dependence on renal dialysis: Secondary | ICD-10-CM | POA: Diagnosis not present

## 2021-05-15 DIAGNOSIS — I10 Essential (primary) hypertension: Secondary | ICD-10-CM | POA: Diagnosis not present

## 2021-05-16 DIAGNOSIS — I10 Essential (primary) hypertension: Secondary | ICD-10-CM | POA: Diagnosis not present

## 2021-05-17 DIAGNOSIS — Z1159 Encounter for screening for other viral diseases: Secondary | ICD-10-CM | POA: Diagnosis not present

## 2021-05-17 DIAGNOSIS — Z992 Dependence on renal dialysis: Secondary | ICD-10-CM | POA: Diagnosis not present

## 2021-05-17 DIAGNOSIS — D509 Iron deficiency anemia, unspecified: Secondary | ICD-10-CM | POA: Diagnosis not present

## 2021-05-17 DIAGNOSIS — N2581 Secondary hyperparathyroidism of renal origin: Secondary | ICD-10-CM | POA: Diagnosis not present

## 2021-05-17 DIAGNOSIS — Z23 Encounter for immunization: Secondary | ICD-10-CM | POA: Diagnosis not present

## 2021-05-17 DIAGNOSIS — I10 Essential (primary) hypertension: Secondary | ICD-10-CM | POA: Diagnosis not present

## 2021-05-17 DIAGNOSIS — N186 End stage renal disease: Secondary | ICD-10-CM | POA: Diagnosis not present

## 2021-05-18 DIAGNOSIS — I10 Essential (primary) hypertension: Secondary | ICD-10-CM | POA: Diagnosis not present

## 2021-05-19 DIAGNOSIS — I10 Essential (primary) hypertension: Secondary | ICD-10-CM | POA: Diagnosis not present

## 2021-05-19 DIAGNOSIS — N186 End stage renal disease: Secondary | ICD-10-CM | POA: Diagnosis not present

## 2021-05-19 DIAGNOSIS — N2581 Secondary hyperparathyroidism of renal origin: Secondary | ICD-10-CM | POA: Diagnosis not present

## 2021-05-19 DIAGNOSIS — Z23 Encounter for immunization: Secondary | ICD-10-CM | POA: Diagnosis not present

## 2021-05-19 DIAGNOSIS — Z992 Dependence on renal dialysis: Secondary | ICD-10-CM | POA: Diagnosis not present

## 2021-05-20 DIAGNOSIS — I10 Essential (primary) hypertension: Secondary | ICD-10-CM | POA: Diagnosis not present

## 2021-05-21 DIAGNOSIS — I10 Essential (primary) hypertension: Secondary | ICD-10-CM | POA: Diagnosis not present

## 2021-05-21 DIAGNOSIS — D509 Iron deficiency anemia, unspecified: Secondary | ICD-10-CM | POA: Diagnosis not present

## 2021-05-21 DIAGNOSIS — Z992 Dependence on renal dialysis: Secondary | ICD-10-CM | POA: Diagnosis not present

## 2021-05-21 DIAGNOSIS — Z23 Encounter for immunization: Secondary | ICD-10-CM | POA: Diagnosis not present

## 2021-05-21 DIAGNOSIS — N186 End stage renal disease: Secondary | ICD-10-CM | POA: Diagnosis not present

## 2021-05-21 DIAGNOSIS — N2581 Secondary hyperparathyroidism of renal origin: Secondary | ICD-10-CM | POA: Diagnosis not present

## 2021-05-22 DIAGNOSIS — I10 Essential (primary) hypertension: Secondary | ICD-10-CM | POA: Diagnosis not present

## 2021-05-24 DIAGNOSIS — Z23 Encounter for immunization: Secondary | ICD-10-CM | POA: Diagnosis not present

## 2021-05-24 DIAGNOSIS — Z992 Dependence on renal dialysis: Secondary | ICD-10-CM | POA: Diagnosis not present

## 2021-05-24 DIAGNOSIS — N2581 Secondary hyperparathyroidism of renal origin: Secondary | ICD-10-CM | POA: Diagnosis not present

## 2021-05-24 DIAGNOSIS — N186 End stage renal disease: Secondary | ICD-10-CM | POA: Diagnosis not present

## 2021-05-26 DIAGNOSIS — Z992 Dependence on renal dialysis: Secondary | ICD-10-CM | POA: Diagnosis not present

## 2021-05-26 DIAGNOSIS — I10 Essential (primary) hypertension: Secondary | ICD-10-CM | POA: Diagnosis not present

## 2021-05-26 DIAGNOSIS — Z23 Encounter for immunization: Secondary | ICD-10-CM | POA: Diagnosis not present

## 2021-05-26 DIAGNOSIS — N2581 Secondary hyperparathyroidism of renal origin: Secondary | ICD-10-CM | POA: Diagnosis not present

## 2021-05-26 DIAGNOSIS — N186 End stage renal disease: Secondary | ICD-10-CM | POA: Diagnosis not present

## 2021-05-27 DIAGNOSIS — I10 Essential (primary) hypertension: Secondary | ICD-10-CM | POA: Diagnosis not present

## 2021-05-28 DIAGNOSIS — I10 Essential (primary) hypertension: Secondary | ICD-10-CM | POA: Diagnosis not present

## 2021-05-29 DIAGNOSIS — I10 Essential (primary) hypertension: Secondary | ICD-10-CM | POA: Diagnosis not present

## 2021-05-30 DIAGNOSIS — I10 Essential (primary) hypertension: Secondary | ICD-10-CM | POA: Diagnosis not present

## 2021-05-31 DIAGNOSIS — Z992 Dependence on renal dialysis: Secondary | ICD-10-CM | POA: Diagnosis not present

## 2021-05-31 DIAGNOSIS — Z23 Encounter for immunization: Secondary | ICD-10-CM | POA: Diagnosis not present

## 2021-05-31 DIAGNOSIS — N186 End stage renal disease: Secondary | ICD-10-CM | POA: Diagnosis not present

## 2021-05-31 DIAGNOSIS — I10 Essential (primary) hypertension: Secondary | ICD-10-CM | POA: Diagnosis not present

## 2021-05-31 DIAGNOSIS — N2581 Secondary hyperparathyroidism of renal origin: Secondary | ICD-10-CM | POA: Diagnosis not present

## 2021-06-01 DIAGNOSIS — I10 Essential (primary) hypertension: Secondary | ICD-10-CM | POA: Diagnosis not present

## 2021-06-02 DIAGNOSIS — I10 Essential (primary) hypertension: Secondary | ICD-10-CM | POA: Diagnosis not present

## 2021-06-03 DIAGNOSIS — I10 Essential (primary) hypertension: Secondary | ICD-10-CM | POA: Diagnosis not present

## 2021-06-04 DIAGNOSIS — Z23 Encounter for immunization: Secondary | ICD-10-CM | POA: Diagnosis not present

## 2021-06-04 DIAGNOSIS — Z992 Dependence on renal dialysis: Secondary | ICD-10-CM | POA: Diagnosis not present

## 2021-06-04 DIAGNOSIS — N186 End stage renal disease: Secondary | ICD-10-CM | POA: Diagnosis not present

## 2021-06-04 DIAGNOSIS — I10 Essential (primary) hypertension: Secondary | ICD-10-CM | POA: Diagnosis not present

## 2021-06-04 DIAGNOSIS — N2581 Secondary hyperparathyroidism of renal origin: Secondary | ICD-10-CM | POA: Diagnosis not present

## 2021-06-05 DIAGNOSIS — I10 Essential (primary) hypertension: Secondary | ICD-10-CM | POA: Diagnosis not present

## 2021-06-06 DIAGNOSIS — I10 Essential (primary) hypertension: Secondary | ICD-10-CM | POA: Diagnosis not present

## 2021-06-07 DIAGNOSIS — Z992 Dependence on renal dialysis: Secondary | ICD-10-CM | POA: Diagnosis not present

## 2021-06-07 DIAGNOSIS — N186 End stage renal disease: Secondary | ICD-10-CM | POA: Diagnosis not present

## 2021-06-07 DIAGNOSIS — N2581 Secondary hyperparathyroidism of renal origin: Secondary | ICD-10-CM | POA: Diagnosis not present

## 2021-06-07 DIAGNOSIS — E559 Vitamin D deficiency, unspecified: Secondary | ICD-10-CM | POA: Diagnosis not present

## 2021-06-07 DIAGNOSIS — I10 Essential (primary) hypertension: Secondary | ICD-10-CM | POA: Diagnosis not present

## 2021-06-07 DIAGNOSIS — Z23 Encounter for immunization: Secondary | ICD-10-CM | POA: Diagnosis not present

## 2021-06-08 DIAGNOSIS — I10 Essential (primary) hypertension: Secondary | ICD-10-CM | POA: Diagnosis not present

## 2021-06-09 DIAGNOSIS — Z23 Encounter for immunization: Secondary | ICD-10-CM | POA: Diagnosis not present

## 2021-06-09 DIAGNOSIS — E559 Vitamin D deficiency, unspecified: Secondary | ICD-10-CM | POA: Diagnosis not present

## 2021-06-09 DIAGNOSIS — N186 End stage renal disease: Secondary | ICD-10-CM | POA: Diagnosis not present

## 2021-06-09 DIAGNOSIS — I10 Essential (primary) hypertension: Secondary | ICD-10-CM | POA: Diagnosis not present

## 2021-06-09 DIAGNOSIS — Z992 Dependence on renal dialysis: Secondary | ICD-10-CM | POA: Diagnosis not present

## 2021-06-09 DIAGNOSIS — N2581 Secondary hyperparathyroidism of renal origin: Secondary | ICD-10-CM | POA: Diagnosis not present

## 2021-06-10 DIAGNOSIS — N61 Mastitis without abscess: Secondary | ICD-10-CM | POA: Diagnosis not present

## 2021-06-10 DIAGNOSIS — I10 Essential (primary) hypertension: Secondary | ICD-10-CM | POA: Diagnosis not present

## 2021-06-11 DIAGNOSIS — I10 Essential (primary) hypertension: Secondary | ICD-10-CM | POA: Diagnosis not present

## 2021-06-12 DIAGNOSIS — I10 Essential (primary) hypertension: Secondary | ICD-10-CM | POA: Diagnosis not present

## 2021-06-13 DIAGNOSIS — I10 Essential (primary) hypertension: Secondary | ICD-10-CM | POA: Diagnosis not present

## 2021-06-14 ENCOUNTER — Other Ambulatory Visit (HOSPITAL_COMMUNITY): Payer: Self-pay | Admitting: Surgery

## 2021-06-14 DIAGNOSIS — N186 End stage renal disease: Secondary | ICD-10-CM | POA: Diagnosis not present

## 2021-06-14 DIAGNOSIS — N61 Mastitis without abscess: Secondary | ICD-10-CM

## 2021-06-14 DIAGNOSIS — I10 Essential (primary) hypertension: Secondary | ICD-10-CM | POA: Diagnosis not present

## 2021-06-14 DIAGNOSIS — Z23 Encounter for immunization: Secondary | ICD-10-CM | POA: Diagnosis not present

## 2021-06-14 DIAGNOSIS — N2581 Secondary hyperparathyroidism of renal origin: Secondary | ICD-10-CM | POA: Diagnosis not present

## 2021-06-14 DIAGNOSIS — Z992 Dependence on renal dialysis: Secondary | ICD-10-CM | POA: Diagnosis not present

## 2021-06-15 DIAGNOSIS — I10 Essential (primary) hypertension: Secondary | ICD-10-CM | POA: Diagnosis not present

## 2021-06-16 DIAGNOSIS — I10 Essential (primary) hypertension: Secondary | ICD-10-CM | POA: Diagnosis not present

## 2021-06-17 ENCOUNTER — Ambulatory Visit (HOSPITAL_COMMUNITY)
Admission: RE | Admit: 2021-06-17 | Discharge: 2021-06-17 | Disposition: A | Discharge status: Home / Self Care | Payer: Medicare HMO | Source: Ambulatory Visit | Attending: Surgery | Admitting: Surgery

## 2021-06-17 ENCOUNTER — Other Ambulatory Visit: Payer: Self-pay

## 2021-06-17 ENCOUNTER — Other Ambulatory Visit (HOSPITAL_COMMUNITY): Payer: Self-pay | Admitting: Surgery

## 2021-06-17 DIAGNOSIS — N644 Mastodynia: Secondary | ICD-10-CM | POA: Diagnosis not present

## 2021-06-17 DIAGNOSIS — R922 Inconclusive mammogram: Secondary | ICD-10-CM | POA: Diagnosis not present

## 2021-06-17 DIAGNOSIS — N61 Mastitis without abscess: Secondary | ICD-10-CM | POA: Diagnosis not present

## 2021-06-17 DIAGNOSIS — I10 Essential (primary) hypertension: Secondary | ICD-10-CM | POA: Diagnosis not present

## 2021-06-18 DIAGNOSIS — Z992 Dependence on renal dialysis: Secondary | ICD-10-CM | POA: Diagnosis not present

## 2021-06-18 DIAGNOSIS — I10 Essential (primary) hypertension: Secondary | ICD-10-CM | POA: Diagnosis not present

## 2021-06-18 DIAGNOSIS — N2581 Secondary hyperparathyroidism of renal origin: Secondary | ICD-10-CM | POA: Diagnosis not present

## 2021-06-18 DIAGNOSIS — N186 End stage renal disease: Secondary | ICD-10-CM | POA: Diagnosis not present

## 2021-06-18 DIAGNOSIS — Z23 Encounter for immunization: Secondary | ICD-10-CM | POA: Diagnosis not present

## 2021-06-19 DIAGNOSIS — I10 Essential (primary) hypertension: Secondary | ICD-10-CM | POA: Diagnosis not present

## 2021-06-20 DIAGNOSIS — I10 Essential (primary) hypertension: Secondary | ICD-10-CM | POA: Diagnosis not present

## 2021-06-21 ENCOUNTER — Other Ambulatory Visit (HOSPITAL_COMMUNITY): Payer: Self-pay | Admitting: Surgery

## 2021-06-21 ENCOUNTER — Other Ambulatory Visit (HOSPITAL_COMMUNITY): Payer: Self-pay | Admitting: Neurosurgery

## 2021-06-21 DIAGNOSIS — Z23 Encounter for immunization: Secondary | ICD-10-CM | POA: Diagnosis not present

## 2021-06-21 DIAGNOSIS — N186 End stage renal disease: Secondary | ICD-10-CM | POA: Diagnosis not present

## 2021-06-21 DIAGNOSIS — Z992 Dependence on renal dialysis: Secondary | ICD-10-CM | POA: Diagnosis not present

## 2021-06-21 DIAGNOSIS — R928 Other abnormal and inconclusive findings on diagnostic imaging of breast: Secondary | ICD-10-CM

## 2021-06-21 DIAGNOSIS — N2581 Secondary hyperparathyroidism of renal origin: Secondary | ICD-10-CM | POA: Diagnosis not present

## 2021-06-21 DIAGNOSIS — I10 Essential (primary) hypertension: Secondary | ICD-10-CM | POA: Diagnosis not present

## 2021-06-22 DIAGNOSIS — Z6841 Body Mass Index (BMI) 40.0 and over, adult: Secondary | ICD-10-CM | POA: Diagnosis not present

## 2021-06-22 DIAGNOSIS — N2581 Secondary hyperparathyroidism of renal origin: Secondary | ICD-10-CM | POA: Diagnosis not present

## 2021-06-22 DIAGNOSIS — Z87891 Personal history of nicotine dependence: Secondary | ICD-10-CM | POA: Diagnosis not present

## 2021-06-22 DIAGNOSIS — D631 Anemia in chronic kidney disease: Secondary | ICD-10-CM | POA: Diagnosis not present

## 2021-06-22 DIAGNOSIS — H543 Unqualified visual loss, both eyes: Secondary | ICD-10-CM | POA: Diagnosis not present

## 2021-06-22 DIAGNOSIS — I120 Hypertensive chronic kidney disease with stage 5 chronic kidney disease or end stage renal disease: Secondary | ICD-10-CM | POA: Diagnosis not present

## 2021-06-22 DIAGNOSIS — N186 End stage renal disease: Secondary | ICD-10-CM | POA: Diagnosis not present

## 2021-06-22 DIAGNOSIS — I739 Peripheral vascular disease, unspecified: Secondary | ICD-10-CM | POA: Diagnosis not present

## 2021-06-22 DIAGNOSIS — N611 Abscess of the breast and nipple: Secondary | ICD-10-CM | POA: Diagnosis not present

## 2021-06-22 DIAGNOSIS — Z8616 Personal history of COVID-19: Secondary | ICD-10-CM | POA: Diagnosis not present

## 2021-06-22 DIAGNOSIS — E669 Obesity, unspecified: Secondary | ICD-10-CM | POA: Diagnosis not present

## 2021-06-22 DIAGNOSIS — Z89432 Acquired absence of left foot: Secondary | ICD-10-CM | POA: Diagnosis not present

## 2021-06-23 DIAGNOSIS — Z992 Dependence on renal dialysis: Secondary | ICD-10-CM | POA: Diagnosis not present

## 2021-06-23 DIAGNOSIS — N186 End stage renal disease: Secondary | ICD-10-CM | POA: Diagnosis not present

## 2021-06-23 DIAGNOSIS — N2581 Secondary hyperparathyroidism of renal origin: Secondary | ICD-10-CM | POA: Diagnosis not present

## 2021-06-23 DIAGNOSIS — D509 Iron deficiency anemia, unspecified: Secondary | ICD-10-CM | POA: Diagnosis not present

## 2021-06-23 DIAGNOSIS — Z1159 Encounter for screening for other viral diseases: Secondary | ICD-10-CM | POA: Diagnosis not present

## 2021-06-23 DIAGNOSIS — Z23 Encounter for immunization: Secondary | ICD-10-CM | POA: Diagnosis not present

## 2021-06-24 DIAGNOSIS — N186 End stage renal disease: Secondary | ICD-10-CM | POA: Diagnosis not present

## 2021-06-24 DIAGNOSIS — Z992 Dependence on renal dialysis: Secondary | ICD-10-CM | POA: Diagnosis not present

## 2021-06-25 DIAGNOSIS — Z992 Dependence on renal dialysis: Secondary | ICD-10-CM | POA: Diagnosis not present

## 2021-06-25 DIAGNOSIS — I10 Essential (primary) hypertension: Secondary | ICD-10-CM | POA: Diagnosis not present

## 2021-06-25 DIAGNOSIS — N186 End stage renal disease: Secondary | ICD-10-CM | POA: Diagnosis not present

## 2021-06-25 DIAGNOSIS — N2581 Secondary hyperparathyroidism of renal origin: Secondary | ICD-10-CM | POA: Diagnosis not present

## 2021-06-26 DIAGNOSIS — I10 Essential (primary) hypertension: Secondary | ICD-10-CM | POA: Diagnosis not present

## 2021-06-27 DIAGNOSIS — I10 Essential (primary) hypertension: Secondary | ICD-10-CM | POA: Diagnosis not present

## 2021-06-28 DIAGNOSIS — I10 Essential (primary) hypertension: Secondary | ICD-10-CM | POA: Diagnosis not present

## 2021-06-29 DIAGNOSIS — N611 Abscess of the breast and nipple: Secondary | ICD-10-CM | POA: Diagnosis not present

## 2021-06-29 DIAGNOSIS — D631 Anemia in chronic kidney disease: Secondary | ICD-10-CM | POA: Diagnosis not present

## 2021-06-29 DIAGNOSIS — Z87891 Personal history of nicotine dependence: Secondary | ICD-10-CM | POA: Diagnosis not present

## 2021-06-29 DIAGNOSIS — E669 Obesity, unspecified: Secondary | ICD-10-CM | POA: Diagnosis not present

## 2021-06-29 DIAGNOSIS — N2581 Secondary hyperparathyroidism of renal origin: Secondary | ICD-10-CM | POA: Diagnosis not present

## 2021-06-29 DIAGNOSIS — I10 Essential (primary) hypertension: Secondary | ICD-10-CM | POA: Diagnosis not present

## 2021-06-29 DIAGNOSIS — I120 Hypertensive chronic kidney disease with stage 5 chronic kidney disease or end stage renal disease: Secondary | ICD-10-CM | POA: Diagnosis not present

## 2021-06-29 DIAGNOSIS — Z89432 Acquired absence of left foot: Secondary | ICD-10-CM | POA: Diagnosis not present

## 2021-06-29 DIAGNOSIS — N186 End stage renal disease: Secondary | ICD-10-CM | POA: Diagnosis not present

## 2021-06-29 DIAGNOSIS — H543 Unqualified visual loss, both eyes: Secondary | ICD-10-CM | POA: Diagnosis not present

## 2021-06-29 DIAGNOSIS — Z6841 Body Mass Index (BMI) 40.0 and over, adult: Secondary | ICD-10-CM | POA: Diagnosis not present

## 2021-06-29 DIAGNOSIS — I739 Peripheral vascular disease, unspecified: Secondary | ICD-10-CM | POA: Diagnosis not present

## 2021-06-29 DIAGNOSIS — Z8616 Personal history of COVID-19: Secondary | ICD-10-CM | POA: Diagnosis not present

## 2021-06-30 DIAGNOSIS — N186 End stage renal disease: Secondary | ICD-10-CM | POA: Diagnosis not present

## 2021-06-30 DIAGNOSIS — I10 Essential (primary) hypertension: Secondary | ICD-10-CM | POA: Diagnosis not present

## 2021-06-30 DIAGNOSIS — Z992 Dependence on renal dialysis: Secondary | ICD-10-CM | POA: Diagnosis not present

## 2021-06-30 DIAGNOSIS — N2581 Secondary hyperparathyroidism of renal origin: Secondary | ICD-10-CM | POA: Diagnosis not present

## 2021-07-01 DIAGNOSIS — E7800 Pure hypercholesterolemia, unspecified: Secondary | ICD-10-CM | POA: Diagnosis not present

## 2021-07-01 DIAGNOSIS — R34 Anuria and oliguria: Secondary | ICD-10-CM | POA: Diagnosis not present

## 2021-07-01 DIAGNOSIS — R5383 Other fatigue: Secondary | ICD-10-CM | POA: Diagnosis not present

## 2021-07-01 DIAGNOSIS — R5381 Other malaise: Secondary | ICD-10-CM | POA: Diagnosis not present

## 2021-07-01 DIAGNOSIS — E039 Hypothyroidism, unspecified: Secondary | ICD-10-CM | POA: Diagnosis not present

## 2021-07-01 DIAGNOSIS — Z8249 Family history of ischemic heart disease and other diseases of the circulatory system: Secondary | ICD-10-CM | POA: Diagnosis not present

## 2021-07-01 DIAGNOSIS — R7989 Other specified abnormal findings of blood chemistry: Secondary | ICD-10-CM | POA: Diagnosis not present

## 2021-07-01 DIAGNOSIS — I10 Essential (primary) hypertension: Secondary | ICD-10-CM | POA: Diagnosis not present

## 2021-07-01 DIAGNOSIS — D519 Vitamin B12 deficiency anemia, unspecified: Secondary | ICD-10-CM | POA: Diagnosis not present

## 2021-07-01 DIAGNOSIS — N186 End stage renal disease: Secondary | ICD-10-CM | POA: Diagnosis not present

## 2021-07-01 DIAGNOSIS — Z79899 Other long term (current) drug therapy: Secondary | ICD-10-CM | POA: Diagnosis not present

## 2021-07-01 DIAGNOSIS — E559 Vitamin D deficiency, unspecified: Secondary | ICD-10-CM | POA: Diagnosis not present

## 2021-07-01 DIAGNOSIS — R69 Illness, unspecified: Secondary | ICD-10-CM | POA: Diagnosis not present

## 2021-07-02 DIAGNOSIS — N2581 Secondary hyperparathyroidism of renal origin: Secondary | ICD-10-CM | POA: Diagnosis not present

## 2021-07-02 DIAGNOSIS — N186 End stage renal disease: Secondary | ICD-10-CM | POA: Diagnosis not present

## 2021-07-02 DIAGNOSIS — I10 Essential (primary) hypertension: Secondary | ICD-10-CM | POA: Diagnosis not present

## 2021-07-02 DIAGNOSIS — Z992 Dependence on renal dialysis: Secondary | ICD-10-CM | POA: Diagnosis not present

## 2021-07-03 DIAGNOSIS — I10 Essential (primary) hypertension: Secondary | ICD-10-CM | POA: Diagnosis not present

## 2021-07-04 DIAGNOSIS — I10 Essential (primary) hypertension: Secondary | ICD-10-CM | POA: Diagnosis not present

## 2021-07-05 DIAGNOSIS — I10 Essential (primary) hypertension: Secondary | ICD-10-CM | POA: Diagnosis not present

## 2021-07-06 DIAGNOSIS — I10 Essential (primary) hypertension: Secondary | ICD-10-CM | POA: Diagnosis not present

## 2021-07-07 DIAGNOSIS — N2581 Secondary hyperparathyroidism of renal origin: Secondary | ICD-10-CM | POA: Diagnosis not present

## 2021-07-07 DIAGNOSIS — N186 End stage renal disease: Secondary | ICD-10-CM | POA: Diagnosis not present

## 2021-07-07 DIAGNOSIS — Z992 Dependence on renal dialysis: Secondary | ICD-10-CM | POA: Diagnosis not present

## 2021-07-07 DIAGNOSIS — I10 Essential (primary) hypertension: Secondary | ICD-10-CM | POA: Diagnosis not present

## 2021-07-08 DIAGNOSIS — I10 Essential (primary) hypertension: Secondary | ICD-10-CM | POA: Diagnosis not present

## 2021-07-09 DIAGNOSIS — N2581 Secondary hyperparathyroidism of renal origin: Secondary | ICD-10-CM | POA: Diagnosis not present

## 2021-07-09 DIAGNOSIS — Z992 Dependence on renal dialysis: Secondary | ICD-10-CM | POA: Diagnosis not present

## 2021-07-09 DIAGNOSIS — I10 Essential (primary) hypertension: Secondary | ICD-10-CM | POA: Diagnosis not present

## 2021-07-09 DIAGNOSIS — N186 End stage renal disease: Secondary | ICD-10-CM | POA: Diagnosis not present

## 2021-07-10 DIAGNOSIS — I10 Essential (primary) hypertension: Secondary | ICD-10-CM | POA: Diagnosis not present

## 2021-07-11 DIAGNOSIS — I10 Essential (primary) hypertension: Secondary | ICD-10-CM | POA: Diagnosis not present

## 2021-07-12 DIAGNOSIS — N186 End stage renal disease: Secondary | ICD-10-CM | POA: Diagnosis not present

## 2021-07-12 DIAGNOSIS — Z992 Dependence on renal dialysis: Secondary | ICD-10-CM | POA: Diagnosis not present

## 2021-07-12 DIAGNOSIS — N2581 Secondary hyperparathyroidism of renal origin: Secondary | ICD-10-CM | POA: Diagnosis not present

## 2021-07-12 DIAGNOSIS — I10 Essential (primary) hypertension: Secondary | ICD-10-CM | POA: Diagnosis not present

## 2021-07-13 DIAGNOSIS — I10 Essential (primary) hypertension: Secondary | ICD-10-CM | POA: Diagnosis not present

## 2021-07-14 DIAGNOSIS — N186 End stage renal disease: Secondary | ICD-10-CM | POA: Diagnosis not present

## 2021-07-14 DIAGNOSIS — Z992 Dependence on renal dialysis: Secondary | ICD-10-CM | POA: Diagnosis not present

## 2021-07-14 DIAGNOSIS — I10 Essential (primary) hypertension: Secondary | ICD-10-CM | POA: Diagnosis not present

## 2021-07-14 DIAGNOSIS — N2581 Secondary hyperparathyroidism of renal origin: Secondary | ICD-10-CM | POA: Diagnosis not present

## 2021-07-15 DIAGNOSIS — I10 Essential (primary) hypertension: Secondary | ICD-10-CM | POA: Diagnosis not present

## 2021-07-16 DIAGNOSIS — Z992 Dependence on renal dialysis: Secondary | ICD-10-CM | POA: Diagnosis not present

## 2021-07-16 DIAGNOSIS — N2581 Secondary hyperparathyroidism of renal origin: Secondary | ICD-10-CM | POA: Diagnosis not present

## 2021-07-16 DIAGNOSIS — I10 Essential (primary) hypertension: Secondary | ICD-10-CM | POA: Diagnosis not present

## 2021-07-16 DIAGNOSIS — N186 End stage renal disease: Secondary | ICD-10-CM | POA: Diagnosis not present

## 2021-07-17 DIAGNOSIS — I10 Essential (primary) hypertension: Secondary | ICD-10-CM | POA: Diagnosis not present

## 2021-07-18 DIAGNOSIS — I10 Essential (primary) hypertension: Secondary | ICD-10-CM | POA: Diagnosis not present

## 2021-07-19 DIAGNOSIS — I10 Essential (primary) hypertension: Secondary | ICD-10-CM | POA: Diagnosis not present

## 2021-07-20 DIAGNOSIS — I10 Essential (primary) hypertension: Secondary | ICD-10-CM | POA: Diagnosis not present

## 2021-07-21 DIAGNOSIS — N186 End stage renal disease: Secondary | ICD-10-CM | POA: Diagnosis not present

## 2021-07-21 DIAGNOSIS — D509 Iron deficiency anemia, unspecified: Secondary | ICD-10-CM | POA: Diagnosis not present

## 2021-07-21 DIAGNOSIS — H547 Unspecified visual loss: Secondary | ICD-10-CM | POA: Diagnosis not present

## 2021-07-21 DIAGNOSIS — N2581 Secondary hyperparathyroidism of renal origin: Secondary | ICD-10-CM | POA: Diagnosis not present

## 2021-07-21 DIAGNOSIS — I10 Essential (primary) hypertension: Secondary | ICD-10-CM | POA: Diagnosis not present

## 2021-07-21 DIAGNOSIS — Z992 Dependence on renal dialysis: Secondary | ICD-10-CM | POA: Diagnosis not present

## 2021-07-23 DIAGNOSIS — N186 End stage renal disease: Secondary | ICD-10-CM | POA: Diagnosis not present

## 2021-07-23 DIAGNOSIS — N2581 Secondary hyperparathyroidism of renal origin: Secondary | ICD-10-CM | POA: Diagnosis not present

## 2021-07-23 DIAGNOSIS — Z1159 Encounter for screening for other viral diseases: Secondary | ICD-10-CM | POA: Diagnosis not present

## 2021-07-23 DIAGNOSIS — Z992 Dependence on renal dialysis: Secondary | ICD-10-CM | POA: Diagnosis not present

## 2021-07-25 DIAGNOSIS — N186 End stage renal disease: Secondary | ICD-10-CM | POA: Diagnosis not present

## 2021-07-25 DIAGNOSIS — Z992 Dependence on renal dialysis: Secondary | ICD-10-CM | POA: Diagnosis not present

## 2021-07-26 DIAGNOSIS — I10 Essential (primary) hypertension: Secondary | ICD-10-CM | POA: Diagnosis not present

## 2021-07-27 DIAGNOSIS — I10 Essential (primary) hypertension: Secondary | ICD-10-CM | POA: Diagnosis not present

## 2021-07-28 DIAGNOSIS — N2581 Secondary hyperparathyroidism of renal origin: Secondary | ICD-10-CM | POA: Diagnosis not present

## 2021-07-28 DIAGNOSIS — Z992 Dependence on renal dialysis: Secondary | ICD-10-CM | POA: Diagnosis not present

## 2021-07-28 DIAGNOSIS — I10 Essential (primary) hypertension: Secondary | ICD-10-CM | POA: Diagnosis not present

## 2021-07-28 DIAGNOSIS — Z1159 Encounter for screening for other viral diseases: Secondary | ICD-10-CM | POA: Diagnosis not present

## 2021-07-28 DIAGNOSIS — D509 Iron deficiency anemia, unspecified: Secondary | ICD-10-CM | POA: Diagnosis not present

## 2021-07-28 DIAGNOSIS — N186 End stage renal disease: Secondary | ICD-10-CM | POA: Diagnosis not present

## 2021-07-29 DIAGNOSIS — I10 Essential (primary) hypertension: Secondary | ICD-10-CM | POA: Diagnosis not present

## 2021-07-30 DIAGNOSIS — Z992 Dependence on renal dialysis: Secondary | ICD-10-CM | POA: Diagnosis not present

## 2021-07-30 DIAGNOSIS — I10 Essential (primary) hypertension: Secondary | ICD-10-CM | POA: Diagnosis not present

## 2021-07-30 DIAGNOSIS — N186 End stage renal disease: Secondary | ICD-10-CM | POA: Diagnosis not present

## 2021-07-30 DIAGNOSIS — N2581 Secondary hyperparathyroidism of renal origin: Secondary | ICD-10-CM | POA: Diagnosis not present

## 2021-07-31 DIAGNOSIS — I10 Essential (primary) hypertension: Secondary | ICD-10-CM | POA: Diagnosis not present

## 2021-08-01 DIAGNOSIS — I10 Essential (primary) hypertension: Secondary | ICD-10-CM | POA: Diagnosis not present

## 2021-08-02 DIAGNOSIS — N186 End stage renal disease: Secondary | ICD-10-CM | POA: Diagnosis not present

## 2021-08-02 DIAGNOSIS — I10 Essential (primary) hypertension: Secondary | ICD-10-CM | POA: Diagnosis not present

## 2021-08-02 DIAGNOSIS — N2581 Secondary hyperparathyroidism of renal origin: Secondary | ICD-10-CM | POA: Diagnosis not present

## 2021-08-02 DIAGNOSIS — Z992 Dependence on renal dialysis: Secondary | ICD-10-CM | POA: Diagnosis not present

## 2021-08-03 DIAGNOSIS — I10 Essential (primary) hypertension: Secondary | ICD-10-CM | POA: Diagnosis not present

## 2021-08-03 DIAGNOSIS — N61 Mastitis without abscess: Secondary | ICD-10-CM | POA: Diagnosis not present

## 2021-08-04 DIAGNOSIS — I10 Essential (primary) hypertension: Secondary | ICD-10-CM | POA: Diagnosis not present

## 2021-08-05 DIAGNOSIS — I10 Essential (primary) hypertension: Secondary | ICD-10-CM | POA: Diagnosis not present

## 2021-08-06 DIAGNOSIS — N186 End stage renal disease: Secondary | ICD-10-CM | POA: Diagnosis not present

## 2021-08-06 DIAGNOSIS — Z992 Dependence on renal dialysis: Secondary | ICD-10-CM | POA: Diagnosis not present

## 2021-08-06 DIAGNOSIS — I10 Essential (primary) hypertension: Secondary | ICD-10-CM | POA: Diagnosis not present

## 2021-08-06 DIAGNOSIS — N2581 Secondary hyperparathyroidism of renal origin: Secondary | ICD-10-CM | POA: Diagnosis not present

## 2021-08-07 DIAGNOSIS — I10 Essential (primary) hypertension: Secondary | ICD-10-CM | POA: Diagnosis not present

## 2021-08-08 DIAGNOSIS — I10 Essential (primary) hypertension: Secondary | ICD-10-CM | POA: Diagnosis not present

## 2021-08-09 DIAGNOSIS — N186 End stage renal disease: Secondary | ICD-10-CM | POA: Diagnosis not present

## 2021-08-09 DIAGNOSIS — I10 Essential (primary) hypertension: Secondary | ICD-10-CM | POA: Diagnosis not present

## 2021-08-09 DIAGNOSIS — N2581 Secondary hyperparathyroidism of renal origin: Secondary | ICD-10-CM | POA: Diagnosis not present

## 2021-08-09 DIAGNOSIS — Z992 Dependence on renal dialysis: Secondary | ICD-10-CM | POA: Diagnosis not present

## 2021-08-10 DIAGNOSIS — I10 Essential (primary) hypertension: Secondary | ICD-10-CM | POA: Diagnosis not present

## 2021-08-11 DIAGNOSIS — I10 Essential (primary) hypertension: Secondary | ICD-10-CM | POA: Diagnosis not present

## 2021-08-12 DIAGNOSIS — I10 Essential (primary) hypertension: Secondary | ICD-10-CM | POA: Diagnosis not present

## 2021-08-13 DIAGNOSIS — N2581 Secondary hyperparathyroidism of renal origin: Secondary | ICD-10-CM | POA: Diagnosis not present

## 2021-08-13 DIAGNOSIS — N186 End stage renal disease: Secondary | ICD-10-CM | POA: Diagnosis not present

## 2021-08-13 DIAGNOSIS — I10 Essential (primary) hypertension: Secondary | ICD-10-CM | POA: Diagnosis not present

## 2021-08-13 DIAGNOSIS — Z992 Dependence on renal dialysis: Secondary | ICD-10-CM | POA: Diagnosis not present

## 2021-08-14 DIAGNOSIS — I10 Essential (primary) hypertension: Secondary | ICD-10-CM | POA: Diagnosis not present

## 2021-08-15 DIAGNOSIS — I10 Essential (primary) hypertension: Secondary | ICD-10-CM | POA: Diagnosis not present

## 2021-08-16 DIAGNOSIS — N2581 Secondary hyperparathyroidism of renal origin: Secondary | ICD-10-CM | POA: Diagnosis not present

## 2021-08-16 DIAGNOSIS — Z992 Dependence on renal dialysis: Secondary | ICD-10-CM | POA: Diagnosis not present

## 2021-08-16 DIAGNOSIS — Z1159 Encounter for screening for other viral diseases: Secondary | ICD-10-CM | POA: Diagnosis not present

## 2021-08-16 DIAGNOSIS — N186 End stage renal disease: Secondary | ICD-10-CM | POA: Diagnosis not present

## 2021-08-16 DIAGNOSIS — I10 Essential (primary) hypertension: Secondary | ICD-10-CM | POA: Diagnosis not present

## 2021-08-16 DIAGNOSIS — D509 Iron deficiency anemia, unspecified: Secondary | ICD-10-CM | POA: Diagnosis not present

## 2021-08-20 DIAGNOSIS — Z992 Dependence on renal dialysis: Secondary | ICD-10-CM | POA: Diagnosis not present

## 2021-08-20 DIAGNOSIS — N186 End stage renal disease: Secondary | ICD-10-CM | POA: Diagnosis not present

## 2021-08-20 DIAGNOSIS — N2581 Secondary hyperparathyroidism of renal origin: Secondary | ICD-10-CM | POA: Diagnosis not present

## 2021-08-23 DIAGNOSIS — N2581 Secondary hyperparathyroidism of renal origin: Secondary | ICD-10-CM | POA: Diagnosis not present

## 2021-08-23 DIAGNOSIS — Z992 Dependence on renal dialysis: Secondary | ICD-10-CM | POA: Diagnosis not present

## 2021-08-23 DIAGNOSIS — N186 End stage renal disease: Secondary | ICD-10-CM | POA: Diagnosis not present

## 2021-08-25 DIAGNOSIS — Z992 Dependence on renal dialysis: Secondary | ICD-10-CM | POA: Diagnosis not present

## 2021-08-25 DIAGNOSIS — N2581 Secondary hyperparathyroidism of renal origin: Secondary | ICD-10-CM | POA: Diagnosis not present

## 2021-08-25 DIAGNOSIS — N186 End stage renal disease: Secondary | ICD-10-CM | POA: Diagnosis not present

## 2021-08-26 DIAGNOSIS — I10 Essential (primary) hypertension: Secondary | ICD-10-CM | POA: Diagnosis not present

## 2021-08-27 DIAGNOSIS — Z992 Dependence on renal dialysis: Secondary | ICD-10-CM | POA: Diagnosis not present

## 2021-08-27 DIAGNOSIS — N186 End stage renal disease: Secondary | ICD-10-CM | POA: Diagnosis not present

## 2021-08-27 DIAGNOSIS — I10 Essential (primary) hypertension: Secondary | ICD-10-CM | POA: Diagnosis not present

## 2021-08-27 DIAGNOSIS — N2581 Secondary hyperparathyroidism of renal origin: Secondary | ICD-10-CM | POA: Diagnosis not present

## 2021-08-28 DIAGNOSIS — I10 Essential (primary) hypertension: Secondary | ICD-10-CM | POA: Diagnosis not present

## 2021-08-29 DIAGNOSIS — I10 Essential (primary) hypertension: Secondary | ICD-10-CM | POA: Diagnosis not present

## 2021-08-30 DIAGNOSIS — Z992 Dependence on renal dialysis: Secondary | ICD-10-CM | POA: Diagnosis not present

## 2021-08-30 DIAGNOSIS — I10 Essential (primary) hypertension: Secondary | ICD-10-CM | POA: Diagnosis not present

## 2021-08-30 DIAGNOSIS — N2581 Secondary hyperparathyroidism of renal origin: Secondary | ICD-10-CM | POA: Diagnosis not present

## 2021-08-30 DIAGNOSIS — N186 End stage renal disease: Secondary | ICD-10-CM | POA: Diagnosis not present

## 2021-08-31 DIAGNOSIS — I10 Essential (primary) hypertension: Secondary | ICD-10-CM | POA: Diagnosis not present

## 2021-09-01 DIAGNOSIS — I10 Essential (primary) hypertension: Secondary | ICD-10-CM | POA: Diagnosis not present

## 2021-09-01 DIAGNOSIS — Z992 Dependence on renal dialysis: Secondary | ICD-10-CM | POA: Diagnosis not present

## 2021-09-01 DIAGNOSIS — N2581 Secondary hyperparathyroidism of renal origin: Secondary | ICD-10-CM | POA: Diagnosis not present

## 2021-09-01 DIAGNOSIS — N186 End stage renal disease: Secondary | ICD-10-CM | POA: Diagnosis not present

## 2021-09-02 DIAGNOSIS — I10 Essential (primary) hypertension: Secondary | ICD-10-CM | POA: Diagnosis not present

## 2021-09-03 DIAGNOSIS — Z992 Dependence on renal dialysis: Secondary | ICD-10-CM | POA: Diagnosis not present

## 2021-09-03 DIAGNOSIS — N186 End stage renal disease: Secondary | ICD-10-CM | POA: Diagnosis not present

## 2021-09-03 DIAGNOSIS — I10 Essential (primary) hypertension: Secondary | ICD-10-CM | POA: Diagnosis not present

## 2021-09-03 DIAGNOSIS — N2581 Secondary hyperparathyroidism of renal origin: Secondary | ICD-10-CM | POA: Diagnosis not present

## 2021-09-04 DIAGNOSIS — I10 Essential (primary) hypertension: Secondary | ICD-10-CM | POA: Diagnosis not present

## 2021-09-05 DIAGNOSIS — I10 Essential (primary) hypertension: Secondary | ICD-10-CM | POA: Diagnosis not present

## 2021-09-06 DIAGNOSIS — Z992 Dependence on renal dialysis: Secondary | ICD-10-CM | POA: Diagnosis not present

## 2021-09-06 DIAGNOSIS — N2581 Secondary hyperparathyroidism of renal origin: Secondary | ICD-10-CM | POA: Diagnosis not present

## 2021-09-06 DIAGNOSIS — I10 Essential (primary) hypertension: Secondary | ICD-10-CM | POA: Diagnosis not present

## 2021-09-06 DIAGNOSIS — N186 End stage renal disease: Secondary | ICD-10-CM | POA: Diagnosis not present

## 2021-09-07 DIAGNOSIS — I10 Essential (primary) hypertension: Secondary | ICD-10-CM | POA: Diagnosis not present

## 2021-09-08 DIAGNOSIS — B373 Candidiasis of vulva and vagina: Secondary | ICD-10-CM | POA: Diagnosis not present

## 2021-09-08 DIAGNOSIS — I10 Essential (primary) hypertension: Secondary | ICD-10-CM | POA: Diagnosis not present

## 2021-09-09 DIAGNOSIS — I10 Essential (primary) hypertension: Secondary | ICD-10-CM | POA: Diagnosis not present

## 2021-09-10 DIAGNOSIS — I10 Essential (primary) hypertension: Secondary | ICD-10-CM | POA: Diagnosis not present

## 2021-09-11 DIAGNOSIS — I10 Essential (primary) hypertension: Secondary | ICD-10-CM | POA: Diagnosis not present

## 2021-09-12 DIAGNOSIS — I10 Essential (primary) hypertension: Secondary | ICD-10-CM | POA: Diagnosis not present

## 2021-09-13 DIAGNOSIS — N2581 Secondary hyperparathyroidism of renal origin: Secondary | ICD-10-CM | POA: Diagnosis not present

## 2021-09-13 DIAGNOSIS — I10 Essential (primary) hypertension: Secondary | ICD-10-CM | POA: Diagnosis not present

## 2021-09-13 DIAGNOSIS — Z992 Dependence on renal dialysis: Secondary | ICD-10-CM | POA: Diagnosis not present

## 2021-09-13 DIAGNOSIS — N186 End stage renal disease: Secondary | ICD-10-CM | POA: Diagnosis not present

## 2021-09-14 DIAGNOSIS — I10 Essential (primary) hypertension: Secondary | ICD-10-CM | POA: Diagnosis not present

## 2021-09-15 DIAGNOSIS — I10 Essential (primary) hypertension: Secondary | ICD-10-CM | POA: Diagnosis not present

## 2021-09-15 DIAGNOSIS — N186 End stage renal disease: Secondary | ICD-10-CM | POA: Diagnosis not present

## 2021-09-15 DIAGNOSIS — Z992 Dependence on renal dialysis: Secondary | ICD-10-CM | POA: Diagnosis not present

## 2021-09-15 DIAGNOSIS — N2581 Secondary hyperparathyroidism of renal origin: Secondary | ICD-10-CM | POA: Diagnosis not present

## 2021-09-16 DIAGNOSIS — I10 Essential (primary) hypertension: Secondary | ICD-10-CM | POA: Diagnosis not present

## 2021-09-17 DIAGNOSIS — Z992 Dependence on renal dialysis: Secondary | ICD-10-CM | POA: Diagnosis not present

## 2021-09-17 DIAGNOSIS — N186 End stage renal disease: Secondary | ICD-10-CM | POA: Diagnosis not present

## 2021-09-17 DIAGNOSIS — N2581 Secondary hyperparathyroidism of renal origin: Secondary | ICD-10-CM | POA: Diagnosis not present

## 2021-09-17 DIAGNOSIS — I10 Essential (primary) hypertension: Secondary | ICD-10-CM | POA: Diagnosis not present

## 2021-09-18 DIAGNOSIS — I10 Essential (primary) hypertension: Secondary | ICD-10-CM | POA: Diagnosis not present

## 2021-09-19 DIAGNOSIS — I10 Essential (primary) hypertension: Secondary | ICD-10-CM | POA: Diagnosis not present

## 2021-09-20 DIAGNOSIS — N186 End stage renal disease: Secondary | ICD-10-CM | POA: Diagnosis not present

## 2021-09-20 DIAGNOSIS — I10 Essential (primary) hypertension: Secondary | ICD-10-CM | POA: Diagnosis not present

## 2021-09-20 DIAGNOSIS — N2581 Secondary hyperparathyroidism of renal origin: Secondary | ICD-10-CM | POA: Diagnosis not present

## 2021-09-20 DIAGNOSIS — Z992 Dependence on renal dialysis: Secondary | ICD-10-CM | POA: Diagnosis not present

## 2021-09-21 ENCOUNTER — Ambulatory Visit (HOSPITAL_COMMUNITY)
Admission: RE | Admit: 2021-09-21 | Discharge: 2021-09-21 | Disposition: A | Discharge status: Home / Self Care | Payer: Medicare HMO | Source: Ambulatory Visit | Attending: Surgery | Admitting: Surgery

## 2021-09-21 ENCOUNTER — Other Ambulatory Visit (HOSPITAL_COMMUNITY): Payer: Self-pay | Admitting: Surgery

## 2021-09-21 ENCOUNTER — Other Ambulatory Visit: Payer: Self-pay

## 2021-09-21 DIAGNOSIS — N611 Abscess of the breast and nipple: Secondary | ICD-10-CM | POA: Diagnosis not present

## 2021-09-21 DIAGNOSIS — I10 Essential (primary) hypertension: Secondary | ICD-10-CM | POA: Diagnosis not present

## 2021-09-21 DIAGNOSIS — R928 Other abnormal and inconclusive findings on diagnostic imaging of breast: Secondary | ICD-10-CM

## 2021-09-21 DIAGNOSIS — R922 Inconclusive mammogram: Secondary | ICD-10-CM | POA: Diagnosis not present

## 2021-09-21 DIAGNOSIS — N644 Mastodynia: Secondary | ICD-10-CM | POA: Diagnosis not present

## 2021-09-21 MED ORDER — LIDOCAINE-EPINEPHRINE (PF) 2 %-1:200000 IJ SOLN
INTRAMUSCULAR | Status: AC
  Filled 2021-09-21: qty 10

## 2021-09-22 DIAGNOSIS — K219 Gastro-esophageal reflux disease without esophagitis: Secondary | ICD-10-CM | POA: Diagnosis not present

## 2021-09-22 DIAGNOSIS — N186 End stage renal disease: Secondary | ICD-10-CM | POA: Diagnosis not present

## 2021-09-22 DIAGNOSIS — I739 Peripheral vascular disease, unspecified: Secondary | ICD-10-CM | POA: Diagnosis not present

## 2021-09-22 DIAGNOSIS — N61 Mastitis without abscess: Secondary | ICD-10-CM | POA: Diagnosis not present

## 2021-09-22 DIAGNOSIS — N611 Abscess of the breast and nipple: Secondary | ICD-10-CM | POA: Diagnosis not present

## 2021-09-22 DIAGNOSIS — I10 Essential (primary) hypertension: Secondary | ICD-10-CM | POA: Diagnosis not present

## 2021-09-22 DIAGNOSIS — N2581 Secondary hyperparathyroidism of renal origin: Secondary | ICD-10-CM | POA: Diagnosis not present

## 2021-09-22 DIAGNOSIS — Z992 Dependence on renal dialysis: Secondary | ICD-10-CM | POA: Diagnosis not present

## 2021-09-25 DIAGNOSIS — I10 Essential (primary) hypertension: Secondary | ICD-10-CM | POA: Diagnosis not present

## 2021-09-26 DIAGNOSIS — I10 Essential (primary) hypertension: Secondary | ICD-10-CM | POA: Diagnosis not present

## 2021-09-26 LAB — AEROBIC/ANAEROBIC CULTURE W GRAM STAIN (SURGICAL/DEEP WOUND): Special Requests: NORMAL

## 2021-09-27 DIAGNOSIS — Z23 Encounter for immunization: Secondary | ICD-10-CM | POA: Diagnosis not present

## 2021-09-27 DIAGNOSIS — N186 End stage renal disease: Secondary | ICD-10-CM | POA: Diagnosis not present

## 2021-09-27 DIAGNOSIS — N2581 Secondary hyperparathyroidism of renal origin: Secondary | ICD-10-CM | POA: Diagnosis not present

## 2021-09-27 DIAGNOSIS — I10 Essential (primary) hypertension: Secondary | ICD-10-CM | POA: Diagnosis not present

## 2021-09-27 DIAGNOSIS — D509 Iron deficiency anemia, unspecified: Secondary | ICD-10-CM | POA: Diagnosis not present

## 2021-09-27 DIAGNOSIS — Z992 Dependence on renal dialysis: Secondary | ICD-10-CM | POA: Diagnosis not present

## 2021-09-28 DIAGNOSIS — I10 Essential (primary) hypertension: Secondary | ICD-10-CM | POA: Diagnosis not present

## 2021-09-29 DIAGNOSIS — Z23 Encounter for immunization: Secondary | ICD-10-CM | POA: Diagnosis not present

## 2021-09-29 DIAGNOSIS — N2581 Secondary hyperparathyroidism of renal origin: Secondary | ICD-10-CM | POA: Diagnosis not present

## 2021-09-29 DIAGNOSIS — I10 Essential (primary) hypertension: Secondary | ICD-10-CM | POA: Diagnosis not present

## 2021-09-29 DIAGNOSIS — N186 End stage renal disease: Secondary | ICD-10-CM | POA: Diagnosis not present

## 2021-09-29 DIAGNOSIS — Z992 Dependence on renal dialysis: Secondary | ICD-10-CM | POA: Diagnosis not present

## 2021-09-30 DIAGNOSIS — I10 Essential (primary) hypertension: Secondary | ICD-10-CM | POA: Diagnosis not present

## 2021-10-01 DIAGNOSIS — N186 End stage renal disease: Secondary | ICD-10-CM | POA: Diagnosis not present

## 2021-10-01 DIAGNOSIS — Z23 Encounter for immunization: Secondary | ICD-10-CM | POA: Diagnosis not present

## 2021-10-01 DIAGNOSIS — I10 Essential (primary) hypertension: Secondary | ICD-10-CM | POA: Diagnosis not present

## 2021-10-01 DIAGNOSIS — Z992 Dependence on renal dialysis: Secondary | ICD-10-CM | POA: Diagnosis not present

## 2021-10-01 DIAGNOSIS — N2581 Secondary hyperparathyroidism of renal origin: Secondary | ICD-10-CM | POA: Diagnosis not present

## 2021-10-02 DIAGNOSIS — I10 Essential (primary) hypertension: Secondary | ICD-10-CM | POA: Diagnosis not present

## 2021-10-03 DIAGNOSIS — I10 Essential (primary) hypertension: Secondary | ICD-10-CM | POA: Diagnosis not present

## 2021-10-04 DIAGNOSIS — Z992 Dependence on renal dialysis: Secondary | ICD-10-CM | POA: Diagnosis not present

## 2021-10-04 DIAGNOSIS — I10 Essential (primary) hypertension: Secondary | ICD-10-CM | POA: Diagnosis not present

## 2021-10-04 DIAGNOSIS — N2581 Secondary hyperparathyroidism of renal origin: Secondary | ICD-10-CM | POA: Diagnosis not present

## 2021-10-04 DIAGNOSIS — Z23 Encounter for immunization: Secondary | ICD-10-CM | POA: Diagnosis not present

## 2021-10-04 DIAGNOSIS — N186 End stage renal disease: Secondary | ICD-10-CM | POA: Diagnosis not present

## 2021-10-05 DIAGNOSIS — I10 Essential (primary) hypertension: Secondary | ICD-10-CM | POA: Diagnosis not present

## 2021-10-06 ENCOUNTER — Emergency Department (HOSPITAL_COMMUNITY)
Admission: EM | Admit: 2021-10-06 | Discharge: 2021-10-06 | Disposition: A | Discharge status: Home / Self Care | Payer: Medicare HMO | Attending: Emergency Medicine | Admitting: Emergency Medicine

## 2021-10-06 ENCOUNTER — Other Ambulatory Visit: Payer: Self-pay

## 2021-10-06 ENCOUNTER — Encounter (HOSPITAL_COMMUNITY): Payer: Self-pay

## 2021-10-06 DIAGNOSIS — Z743 Need for continuous supervision: Secondary | ICD-10-CM | POA: Diagnosis not present

## 2021-10-06 DIAGNOSIS — Z87891 Personal history of nicotine dependence: Secondary | ICD-10-CM | POA: Insufficient documentation

## 2021-10-06 DIAGNOSIS — Z7982 Long term (current) use of aspirin: Secondary | ICD-10-CM | POA: Diagnosis not present

## 2021-10-06 DIAGNOSIS — I120 Hypertensive chronic kidney disease with stage 5 chronic kidney disease or end stage renal disease: Secondary | ICD-10-CM | POA: Insufficient documentation

## 2021-10-06 DIAGNOSIS — Z79899 Other long term (current) drug therapy: Secondary | ICD-10-CM | POA: Insufficient documentation

## 2021-10-06 DIAGNOSIS — R52 Pain, unspecified: Secondary | ICD-10-CM | POA: Diagnosis not present

## 2021-10-06 DIAGNOSIS — N611 Abscess of the breast and nipple: Secondary | ICD-10-CM | POA: Diagnosis not present

## 2021-10-06 DIAGNOSIS — Z8616 Personal history of COVID-19: Secondary | ICD-10-CM | POA: Diagnosis not present

## 2021-10-06 DIAGNOSIS — Z992 Dependence on renal dialysis: Secondary | ICD-10-CM | POA: Diagnosis not present

## 2021-10-06 DIAGNOSIS — I10 Essential (primary) hypertension: Secondary | ICD-10-CM | POA: Diagnosis not present

## 2021-10-06 DIAGNOSIS — N186 End stage renal disease: Secondary | ICD-10-CM | POA: Insufficient documentation

## 2021-10-06 MED ORDER — AMOXICILLIN-POT CLAVULANATE 875-125 MG PO TABS: 1.0000 | ORAL_TABLET | Freq: Two times a day (BID) | ORAL | 0 refills | Status: AC

## 2021-10-06 MED ORDER — OXYCODONE-ACETAMINOPHEN 5-325 MG PO TABS: 1.0000 | ORAL_TABLET | Freq: Three times a day (TID) | ORAL | 0 refills | Status: AC | PRN

## 2021-10-06 MED ORDER — OXYCODONE-ACETAMINOPHEN 5-325 MG PO TABS
1.0000 | ORAL_TABLET | Freq: Once | ORAL | Status: AC
  Administered 2021-10-06: 1 via ORAL
  Filled 2021-10-06: qty 1

## 2021-10-06 NOTE — ED Triage Notes (Signed)
Pt presents to ED via [facility] for abscess on left breast x 1 week, started draining this morning. Pt has been on doxy for abscess. Pt from [facility] group home. P hypertensive 204/140

## 2021-10-06 NOTE — Discharge Instructions (Addendum)
You have a left-sided breast abscess, looking through chart, you are supposed be taking Augmentin I have changed your antibiotics please stop taking the doxycycline and start taking the Augmentin as prescribed to you.  I recommend applying warm compresses to the area and keep the area clean as this will help improve with pain and wound healing.I have given you a short course of narcotics please take as prescribed.  This medication can make you drowsy do not consume alcohol or operate heavy machinery when taking this medication.  This medication is Tylenol in it do not take Tylenol and take this medication  Please call the breast surgeon today to let them know that you are seen here in the emergency department to see if they would like to see you earlier than the 17th.  Come back to the emergency department if you develop chest pain, shortness of breath, severe abdominal pain, uncontrolled nausea, vomiting, diarrhea.

## 2021-10-06 NOTE — ED Provider Notes (Signed)
[facility] EMERGENCY DEPARTMENT Provider Note   CSN: [id number] Arrival date & time: 10/06/21  0935     History Chief Complaint  Patient presents with   Abscess    Rebekah Burton is a 45 y.o. female.  HPI  Patient with significant medical history of legally blind, end-stage renal disease, foot ulcer, hypertension, PAD presents to the emergency department with chief complaint of left-sided breast abscess.  Patient states she has been dealing with these abscesses since January of last year, 3 has had surgical debridements, with occasional flareups since the procedure.  She states that she went to see her surgeon on 09/28 for another flareup of her breast abscess, she had guided ultrasound performed with cultures obtained showing that she has Proteus sensitive to Augmentin.  She was initially started on Bactrim and then switched to Doxy and it appears that she should be switch to Augmentin to the culture sensitivity but she has not gotten a new prescription yet.  She states that she is going to follow-up with the breast surgeon on the 17th for further evaluation.  She states that she is here today because the abscess opened up and started to drain.  She states that she has some purulent discharge from the area, she states the pain has remained unchanged, she states the pain is constant, does not radiate, describes a dull-like sensation denies any leaving or aggravating factors.  She has no associated fevers, chills, chest pain, shortness breath, abdominal pain, nausea vomiting diarrhea.  She has no other complaints.   Past Medical History:  Diagnosis Date   Blind    ESRD (end stage renal disease) ([place])    Foot ulceration ([place]) 12/2018   Hypertension    PAD (peripheral artery disease) [facility])     Patient Active Problem List   Diagnosis Date Noted   Complication of vascular access for dialysis    Poor social situation    Left breast abscess    Breast abscess 01/15/2021   COVID-19 virus  infection 01/15/2021   Breast abscess of female 01/15/2021   Cellulitis of left lower extremity 02/05/2019   Post-operative pain    Acute blood loss anemia    S/P transmetatarsal amputation of foot, left ([place])    Ischemic ulcer of left foot ([place]) 01/16/2019   ESRD (end stage renal disease) ([place]) 01/16/2019   HTN (hypertension) 01/16/2019   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   PAD (peripheral artery disease) ([place]) 01/16/2019    Past Surgical History:  Procedure Laterality Date   ABDOMINAL AORTOGRAM W/LOWER EXTREMITY Bilateral 01/17/2019   Procedure: ABDOMINAL AORTOGRAM W/LOWER EXTREMITY;  Surgeon: Waynetta Sandy, MD;  Location: [place] [facility];  Service: Cardiovascular;  Laterality: Bilateral;   ABDOMINAL HYSTERECTOMY     ENDARTERECTOMY FEMORAL Left 01/18/2019   Procedure: ENDARTERECTOMY FEMORAL with Perfundaplasty;  Surgeon: Rosetta Posner, MD;  Location: [place];  Service: Vascular;  Laterality: Left;   FEMORAL-POPLITEAL BYPASS GRAFT Left 01/18/2019   Procedure: Left  FEMORAL- to  Below the Knee POPLITEAL ARTERY bypass using reversed safenous vein.;  Surgeon: Rosetta Posner, MD;  Location: [place];  Service: Vascular;  Laterality: Left;   INCISION AND DRAINAGE ABSCESS Left 01/15/2021   Procedure: INCISION AND DRAINAGE BREAST ABSCESS;  Surgeon: Dwan Bolt, MD;  Location: [place];  Service: General;  Laterality: Left;   INCISION AND DRAINAGE ABSCESS Left 02/25/2021   Procedure: INCISION AND DRAINAGE BREAST  ABSCESS;  Surgeon: Dwan Bolt, MD;  Location: [place];  Service: General;  Laterality: Left;   IR DIALY SHUNT INTRO NEEDLE/INTRACATH INITIAL W/IMG LEFT Left 01/28/2021   IR THROMBECTOMY AV FISTULA W/THROMBOLYSIS/PTA/STENT INC/SHUNT/IMG LT Left 10/23/2020   IR US GUIDE BX ASP/DRAIN  01/28/2021   IR US GUIDE VASC ACCESS LEFT  01/26/2021   toe removal Right    All toes on right foot have been removed.    TRANSMETATARSAL AMPUTATION Left 01/18/2019   Procedure: TRANSMETATARSAL AMPUTATION;   Surgeon: Rosetta Posner, MD;  [facility] OR;  Service: Vascular;  Laterality: Left;     OB History   No obstetric history on file.     Family History  Problem Relation Age of Onset   Peripheral Artery Disease Neg Hx    Kidney failure Neg Hx     Social History   Tobacco Use   Smoking status: Former    Packs/day: 0.50    Years: 20.00    Pack years: 10.00    Types: Cigarettes    Quit date: 01/14/2019    Years since quitting: 2.7   Smokeless tobacco: Never   Tobacco comments:    <1 PPD  Vaping Use   Vaping Use: Never used  Substance Use Topics   Alcohol use: Never   Drug use: Never    Home Medications Prior to Admission medications   Medication Sig Start Date End Date Taking? Authorizing Provider  acetaminophen (TYLENOL) 325 MG tablet Take 2 tablets (650 mg total) by mouth every 6 (six) hours as needed for mild pain (or Fever >/= 101). Patient not taking: No sig reported 03/08/21   Samella Parr, NP  amoxicillin (AMOXIL) 500 MG capsule Take 500 mg by mouth every 8 (eight) hours. 04/28/21   [provider]  amoxicillin-clavulanate (AUGMENTIN) 875-125 MG tablet Take 1 tablet by mouth every 12 (twelve) hours for 28 days. 10/06/21 11/03/21 Yes Marcello Fennel, PA-C  aspirin 81 MG EC tablet Take 1 tablet (81 mg total) by mouth daily. Patient not taking: No sig reported 03/08/21   Samella Parr, NP  azithromycin (ZITHROMAX) 250 MG tablet Take 2 tablets together on the first day, then 1 every day until finished. 05/05/21   Maudie Flakes, MD  B Complex Vitamins (VITAMIN B COMPLEX) TABS TAKE 1 TABLET BY MOUTH EVERY MONDAY, WEDNESDAY, AND FRIDAY WITH HEMODIALYSIS. Patient not taking: Reported on 05/04/2021 03/17/21 03/17/22  Samella Parr, NP  b complex-vitamin c-folic acid (NEPHRO-VITE) 0.8 MG TABS tablet Take 1 tablet by mouth every Monday, Wednesday, and Friday with hemodialysis. 03/17/21   Samella Parr, NP  carvedilol (COREG) 25 MG tablet Take 1 tablet (25 mg  total) by mouth 2 (two) times daily. 03/17/21   Samella Parr, NP  carvedilol (COREG) 25 MG tablet TAKE 1 TABLET (25 MG TOTAL) BY MOUTH TWO TIMES DAILY. Patient not taking: No sig reported 03/17/21 03/17/22  Samella Parr, NP  cloNIDine (CATAPRES) 0.3 MG tablet Take 1 tablet (0.3 mg total) by mouth 3 (three) times daily. 03/17/21   Samella Parr, NP  cloNIDine (CATAPRES) 0.3 MG tablet TAKE 1 TABLET (0.3 MG TOTAL) BY MOUTH THREE TIMES DAILY. Patient not taking: No sig reported 03/17/21 03/17/22  Samella Parr, NP  ibuprofen (ADVIL) 600 MG tablet Take 1 tablet (600 mg total) by mouth 3 (three) times daily. 03/17/21   Samella Parr, NP  ibuprofen (ADVIL) 600 MG tablet TAKE 1 TABLET (600 MG TOTAL) BY MOUTH THREE TIMES DAILY. Patient not taking: No sig reported 03/17/21  03/17/22  Samella Parr, NP  lanthanum (FOSRENOL) 1000 MG chewable tablet Chew 1 tablet (1,000 mg total) by mouth 3 (three) times daily with meals. 03/18/21   Samella Parr, NP  lanthanum (FOSRENOL) 1000 MG chewable tablet CHEW AND SWALLOW 1 TABLET (1,000 MG TOTAL) BY MOUTH THREE TIMES DAILY WITH MEALS. Patient not taking: No sig reported 03/18/21 03/18/22  Samella Parr, NP  lidocaine-prilocaine (EMLA) cream Apply 1 application topically every Monday, Wednesday, and Friday with hemodialysis.  Patient not taking: No sig reported 11/20/18   [provider]  ondansetron (ZOFRAN-ODT) 4 MG disintegrating tablet Take by mouth. 04/26/21   [provider]  oxyCODONE 10 MG TABS Take 1 tablet (10 mg total) by mouth every 4 (four) hours as needed for moderate pain. Patient not taking: No sig reported 03/17/21   Samella Parr, NP  oxyCODONE-acetaminophen (PERCOCET/ROXICET) 5-325 MG tablet Take 1 tablet by mouth every 8 (eight) hours as needed for up to 3 days for severe pain. 10/06/21 10/09/21 Yes Marcello Fennel, PA-C  pantoprazole (PROTONIX) 40 MG tablet Take 1 tablet (40 mg total) by mouth daily. 03/17/21    Samella Parr, NP  pantoprazole (PROTONIX) 40 MG tablet TAKE 1 TABLET (40 MG TOTAL) BY MOUTH DAILY. Patient not taking: No sig reported 03/17/21 03/17/22  Samella Parr, NP  pregabalin (LYRICA) 75 MG capsule Take 1 capsule (75 mg total) by mouth at bedtime. Patient not taking: No sig reported 03/17/21   Samella Parr, NP  pregabalin (LYRICA) 75 MG capsule TAKE 1 CAPSULE (75 MG TOTAL) BY MOUTH AT BEDTIME. Patient not taking: No sig reported 03/17/21 09/13/21  Samella Parr, NP  pregabalin (LYRICA) 75 MG capsule TAKE 1 CAPSULE (75 MG TOTAL) BY MOUTH AT BEDTIME. Patient not taking: No sig reported 03/08/21 09/04/21  Samella Parr, NP  senna (SENOKOT) 8.6 MG TABS tablet Take 1 tablet (8.6 mg total) by mouth 2 (two) times daily. 03/17/21   Samella Parr, NP  senna (SENOKOT) 8.6 MG TABS tablet TAKE 1 TABLET (8.6 MG TOTAL) BY MOUTH 2 (TWO) TIMES DAILY. Patient not taking: No sig reported 03/17/21 03/17/22  Samella Parr, NP  senna (SENOKOT) 8.6 MG TABS tablet TAKE 1 TABLET (8.6 MG TOTAL) BY MOUTH 2 (TWO) TIMES DAILY. Patient not taking: No sig reported 03/08/21 03/08/22  Samella Parr, NP  sevelamer carbonate (RENVELA) 800 MG tablet TAKE 4 TABLETS (3,200 MG TOTAL) BY MOUTH THREE TIMES DAILY WITH MEALS. Patient not taking: No sig reported 03/17/21 03/17/22  Samella Parr, NP  traZODone (DESYREL) 150 MG tablet Take 150 mg by mouth at bedtime. 05/01/21   [provider]    Allergies    Percocet [oxycodone-acetaminophen]  Review of Systems   Review of Systems  Constitutional:  Negative for chills and fever.  HENT:  Negative for congestion.   Respiratory:  Negative for shortness of breath.   Cardiovascular:  Negative for chest pain.  Gastrointestinal:  Negative for abdominal pain.  Genitourinary:  Negative for enuresis.  Musculoskeletal:  Negative for back pain.  Skin:  Positive for wound. Negative for rash.       Left breast abscess.  Neurological:  Negative for dizziness.   Hematological:  Does not bruise/bleed easily.   Physical Exam Updated Vital Signs BP (!) 190/107 (BP Location: Right Arm)   Pulse 88   Temp 98.1 F (36.7 C)   Resp 20   Ht '5\' 4"'$  (1.626 m)   Wt  90.7 kg   SpO2 97%   BMI 34.33 kg/m   Physical Exam Vitals and nursing note reviewed. Exam conducted with a chaperone present.  Constitutional:      General: She is not in acute distress.    Appearance: She is not ill-appearing.  HENT:     Head: Normocephalic and atraumatic.     Nose: No congestion.  Eyes:     Comments: Eyes were not assessed as they were close.  Cardiovascular:     Rate and Rhythm: Normal rate and regular rhythm.     Pulses: Normal pulses.     Heart sounds: No murmur heard.   No friction rub. No gallop.  Pulmonary:     Effort: No respiratory distress.     Breath sounds: No wheezing, rhonchi or rales.  Skin:    General: Skin is warm and dry.     Comments: With a chaperone present breast exam was performed, there is notable skin changes around the areolar, there is purulent discharge present, no overlying erythema, area is warm to the touch, fluctuance or indurations present.  Appears patient has inversion of the left nipple.  Neurological:     Mental Status: She is alert.  Psychiatric:        Mood and Affect: Mood normal.    ED Results / Procedures / Treatments   Labs (all labs ordered are listed, but only abnormal results are displayed) Labs Reviewed  AEROBIC CULTURE W GRAM STAIN (SUPERFICIAL SPECIMEN)    EKG None  Radiology No results found.  Procedures Procedures   Medications Ordered in ED Medications  oxyCODONE-acetaminophen (PERCOCET/ROXICET) 5-325 MG per tablet 1 tablet (1 tablet Oral Given 10/06/21 1044)    ED Course  I have reviewed the triage vital signs and the nursing notes.  Pertinent labs & imaging results that were available during my care of the patient were reviewed by me and considered in my medical decision making (see chart  for details).    MDM Rules/Calculators/A&P                          Initial impression-patient presents with concerns of left breast abscess.  She is alert, does not appear in acute stress, vital signs noted for hypertension of 190/107.  Will obtain wound cultures and reassess.  Work-up-wound cultures are pending at this time.  Reassessment-updated patient on recommendations from her general surgeon, we will switch her from doxycycline to Augmentin as it appears this is sensitive to this.  will have her call her surgeon to see if she needs a closer follow-up appointment.  Patient requesting for pain, will provide her with oxycodone, in her chart says that she gets hives from oxycodone, I asked the patient about this and she states that she has no allergies to oxycodone.  Rule out-I have low suspicion for systemic infection as patient is nontoxic-appearing, vital signs are reassuring.  I have low suspicion for necrotizing fasciitis as there is no necrotic skin present my exam, patient nontoxic-appearing, presentation is atypical of etiology.  I have low suspicion for hypertensive urgency or emergency as patient does not endorse headaches, paresthesias the upper or lower extremities, there is no signs of organ damage present my exam.  We will have her continue take her blood pressure medications.  Will defer treatment at this time.  Plan-  Breast abscess-wound is actively draining, will switch her from doxycycline to Augmentin, recommend warm compresses to the area, provided with  pain medications, follow-up with her breast surgeon for further evaluation.  Gave her strict return precautions.  Vital signs have remained stable, no indication for hospital admission.  Patient discussed with attending and they agreed with assessment and plan.  Patient given at home care as well strict return precautions.  Patient verbalized that they understood agreed to said plan.  Final Clinical Impression(s) / ED  Diagnoses Final diagnoses:  Breast abscess    Rx / DC Orders ED Discharge Orders          Ordered    amoxicillin-clavulanate (AUGMENTIN) 875-125 MG tablet  Every 12 hours        10/06/21 1053    Ambulatory referral to Home Health       Comments: Please evaluate Lelsie Mackrell for admission to [place].  Disciplines requested: Nursing  Services to provide: [facility]  Physician to follow patient's care (the person listed here will be responsible for signing ongoing orders): PCP  Requested Start of Care Date: Tomorrow  I certify that this patient is under my care and that I, or a Nurse Practitioner or Physician's Assistant working with me, had a face-to-face encounter that meets the physician face-to-face requirements with patient on 10/06/21. The encounter with the patient was in whole, or in part for the following medical condition(s) which is the primary reason for home health care (List medical condition).  Patient is legally blind and has a complex left breast abscess which will need frequent bandage changes.   10/06/21 1053    oxyCODONE-acetaminophen (PERCOCET/ROXICET) 5-325 MG tablet  Every 8 hours PRN        10/06/21 1053             Aron Baba 10/06/21 1055    Hayden Rasmussen, MD 10/06/21 1912

## 2021-10-07 DIAGNOSIS — I10 Essential (primary) hypertension: Secondary | ICD-10-CM | POA: Diagnosis not present

## 2021-10-08 DIAGNOSIS — Z23 Encounter for immunization: Secondary | ICD-10-CM | POA: Diagnosis not present

## 2021-10-08 DIAGNOSIS — N186 End stage renal disease: Secondary | ICD-10-CM | POA: Diagnosis not present

## 2021-10-08 DIAGNOSIS — N2581 Secondary hyperparathyroidism of renal origin: Secondary | ICD-10-CM | POA: Diagnosis not present

## 2021-10-08 DIAGNOSIS — Z992 Dependence on renal dialysis: Secondary | ICD-10-CM | POA: Diagnosis not present

## 2021-10-08 DIAGNOSIS — I10 Essential (primary) hypertension: Secondary | ICD-10-CM | POA: Diagnosis not present

## 2021-10-09 DIAGNOSIS — I10 Essential (primary) hypertension: Secondary | ICD-10-CM | POA: Diagnosis not present

## 2021-10-09 LAB — AEROBIC CULTURE W GRAM STAIN (SUPERFICIAL SPECIMEN)

## 2021-10-10 DIAGNOSIS — I10 Essential (primary) hypertension: Secondary | ICD-10-CM | POA: Diagnosis not present

## 2021-10-11 DIAGNOSIS — I10 Essential (primary) hypertension: Secondary | ICD-10-CM | POA: Diagnosis not present

## 2021-10-12 DIAGNOSIS — I10 Essential (primary) hypertension: Secondary | ICD-10-CM | POA: Diagnosis not present

## 2021-10-13 DIAGNOSIS — Z992 Dependence on renal dialysis: Secondary | ICD-10-CM | POA: Diagnosis not present

## 2021-10-13 DIAGNOSIS — N2581 Secondary hyperparathyroidism of renal origin: Secondary | ICD-10-CM | POA: Diagnosis not present

## 2021-10-13 DIAGNOSIS — Z23 Encounter for immunization: Secondary | ICD-10-CM | POA: Diagnosis not present

## 2021-10-13 DIAGNOSIS — N186 End stage renal disease: Secondary | ICD-10-CM | POA: Diagnosis not present

## 2021-10-13 DIAGNOSIS — I10 Essential (primary) hypertension: Secondary | ICD-10-CM | POA: Diagnosis not present

## 2021-10-14 DIAGNOSIS — I10 Essential (primary) hypertension: Secondary | ICD-10-CM | POA: Diagnosis not present

## 2021-10-15 DIAGNOSIS — N2581 Secondary hyperparathyroidism of renal origin: Secondary | ICD-10-CM | POA: Diagnosis not present

## 2021-10-15 DIAGNOSIS — Z992 Dependence on renal dialysis: Secondary | ICD-10-CM | POA: Diagnosis not present

## 2021-10-15 DIAGNOSIS — N186 End stage renal disease: Secondary | ICD-10-CM | POA: Diagnosis not present

## 2021-10-15 DIAGNOSIS — I10 Essential (primary) hypertension: Secondary | ICD-10-CM | POA: Diagnosis not present

## 2021-10-15 DIAGNOSIS — Z23 Encounter for immunization: Secondary | ICD-10-CM | POA: Diagnosis not present

## 2021-10-16 DIAGNOSIS — I10 Essential (primary) hypertension: Secondary | ICD-10-CM | POA: Diagnosis not present

## 2021-10-17 DIAGNOSIS — I10 Essential (primary) hypertension: Secondary | ICD-10-CM | POA: Diagnosis not present

## 2021-10-18 ENCOUNTER — Ambulatory Visit: Payer: Self-pay | Admitting: Surgery

## 2021-10-18 DIAGNOSIS — Z23 Encounter for immunization: Secondary | ICD-10-CM | POA: Diagnosis not present

## 2021-10-18 DIAGNOSIS — N2581 Secondary hyperparathyroidism of renal origin: Secondary | ICD-10-CM | POA: Diagnosis not present

## 2021-10-18 DIAGNOSIS — I10 Essential (primary) hypertension: Secondary | ICD-10-CM | POA: Diagnosis not present

## 2021-10-18 DIAGNOSIS — Z992 Dependence on renal dialysis: Secondary | ICD-10-CM | POA: Diagnosis not present

## 2021-10-18 DIAGNOSIS — N611 Abscess of the breast and nipple: Secondary | ICD-10-CM | POA: Diagnosis not present

## 2021-10-18 DIAGNOSIS — N186 End stage renal disease: Secondary | ICD-10-CM | POA: Diagnosis not present

## 2021-10-18 DIAGNOSIS — D509 Iron deficiency anemia, unspecified: Secondary | ICD-10-CM | POA: Diagnosis not present

## 2021-10-18 DIAGNOSIS — Z1159 Encounter for screening for other viral diseases: Secondary | ICD-10-CM | POA: Diagnosis not present

## 2021-10-18 NOTE — H&P (Signed)
Subjective    Chief Complaint: Follow-up (Breast abscess/)       History of Present Illness: Rebekah Burton is a 45 y.o. female who is seen today as an office consultation at the request of Dr. Pasty Arch for evaluation of Follow-up (Breast abscess/) .   This is a 45 year old female with end-stage renal disease who initially presented in January 2022 with left breast pain.  Ultrasound showed a subareolar abscess in the left breast and some enlarged left axillary lymph nodes.  She was started on antibiotics and was referred for ultrasound-guided aspiration.  Unfortunately, she never had the procedure performed and presented back to the emergency department on 01/15/2021 with drainage from an abscess.  She underwent surgical debridement and drainage on 01/15/2021 by Dr. Zenia Resides.   She had recurrence of this area in March and underwent further drainage.  Biopsies have all been negative for malignancy.  She underwent diagnostic mammogram in June that shows some persistent cellulitis.  13-month follow-up was recommended.  She underwent further mammogram and ultrasound of the left breast in September.  This showed a 5.4 cm retroareolar recurrent abscess.  This abscess was aspirated and the patient was placed on antibiotics.  Culture showed Proteus mirabilis.  She is now referred for formal surgical excision and management of this recurrent left breast abscess.  She has had 2 recent ER visits for drainage from the left breast.     Review of Systems: A complete review of systems was obtained from the patient.  I have reviewed this information and discussed as appropriate with the patient.  See HPI as well for other ROS.   Review of Systems  Constitutional: Negative.   HENT: Negative.   Eyes: Negative.   Respiratory: Negative.   Cardiovascular: Negative.   Gastrointestinal: Negative.   Genitourinary: Negative.   Musculoskeletal: Negative.   Skin: Negative.   Neurological: Negative.   Endo/Heme/Allergies:  Negative.   Psychiatric/Behavioral: Negative.         Medical History: Past Medical History      Past Medical History:  Diagnosis Date   ESRD (end stage renal disease) (CMS-HCC)     Hypertension     Legally blind     Peripheral arterial disease (CMS-HCC)             Patient Active Problem List  Diagnosis   Abscess of the breast and nipple   Acute blood loss anemia   Anemia   Blind in both eyes   Cellulitis of left lower extremity   Complication of vascular access for dialysis   COVID-19 virus infection   ESRD on dialysis (CMS-HCC)   HTN (hypertension)   Hypertension, essential, benign   Ischemic ulcer of left foot (CMS-HCC)   PAD (peripheral artery disease) (CMS-HCC)   Poor social situation   Post-operative pain   S/P transmetatarsal amputation of foot, left (CMS-HCC)   Uremia   Chronic pain syndrome   Dependence on renal dialysis (CMS-HCC)   History of tobacco use   Legal blindness, as defined in [place]   Pseudotumor cerebri   Anemia of chronic disease   Anemia of chronic renal failure   End stage renal disease (CMS-HCC)   Essential hypertension   Peripheral vascular disease (CMS-HCC)   Obesity      Past Surgical History       Past Surgical History:  Procedure Laterality Date   BYPASS GRAFT FEMORAL-POPLITEAL   01/18/2019   HYSTERECTOMY       INCISION AND DRAINAGE BREAST  ABSCESS Left     transmetatarsal amputation Left          Allergies      Allergies  Allergen Reactions   Cinacalcet Unknown              Current Outpatient Medications on File Prior to Visit  Medication Sig Dispense Refill   acetaminophen (TYLENOL) 325 MG tablet Take by mouth       amoxicillin-clavulanate (AUGMENTIN) 875-125 mg tablet Take 1 tablet (875 mg total) by mouth every 12 (twelve) hours for 28 days 56 tablet 0   azithromycin (ZITHROMAX) 250 MG tablet         carvediloL (COREG) 25 MG tablet 1 tab(s)       cloNIDine HCL (CATAPRES) 0.2 MG tablet 1 tab(s)       ibuprofen  (MOTRIN) 600 MG tablet TAKE 1 TABLET (600 MG TOTAL) BY MOUTH THREE TIMES DAILY.       lanthanum (FOSRENOL) 1000 MG chewable tablet CHEW AND SWALLOW 1 TABLET (1,000 MG TOTAL) BY MOUTH THREE TIMES DAILY WITH MEALS.       ondansetron (ZOFRAN-ODT) 4 MG disintegrating tablet         oxyCODONE (ROXICODONE) 5 MG immediate release tablet Take 1 tablet (5 mg total) by mouth every 4 (four) hours as needed for Pain 30 tablet 0   pantoprazole (PROTONIX) 40 MG DR tablet Take 40 mg by mouth once daily       pregabalin (LYRICA) 75 MG capsule TAKE 1 CAPSULE (75 MG TOTAL) BY MOUTH AT BEDTIME.       sennosides (SENOKOT) 8.6 mg tablet TAKE 1 TABLET (8.6 MG TOTAL) BY MOUTH 2 (TWO) TIMES DAILY.       sevelamer carbonate (RENVELA) 800 mg tablet 1 tab(s)       traZODone (DESYREL) 150 MG tablet          No current facility-administered medications on file prior to visit.      Family History  History reviewed. No pertinent family history.      Social History        Tobacco Use  Smoking Status Current Every Day Smoker   Packs/day: 0.50   Types: Cigarettes  Smokeless Tobacco Never Used      Social History  Social History         Socioeconomic History   Marital status: Single  Tobacco Use   Smoking status: Current Every Day Smoker      Packs/day: 0.50      Types: Cigarettes   Smokeless tobacco: Never Used  Scientific laboratory technician Use: Never used  Substance and Sexual Activity   Alcohol use: Not Currently   Drug use: Never        Objective:        Physical Exam    Constitutional:  WDWN in NAD, conversant, no obvious deformities; lying in bed comfortably Eyes:  Pupils equal, round; sclera anicteric; moist conjunctiva; no lid lag HENT:  Oral mucosa moist; good dentition  Neck:  No masses palpated, trachea midline; no thyromegaly Lungs:  CTA bilaterally; normal respiratory effort Breasts: Large, pendulous, bilateral signs of breast lymphedema: The right breast shows no sign of cellulitis,  fluctuance, or erythema.  The left breast is more tender and has a 1.5 cm opening medial to the nipple with some purulent drainage.  She has some healed surgical scars inferior laterally to the nipple.  This entire area is thickened.  There is no erythema. CV:  Regular rate and  rhythm; no murmurs; extremities well-perfused with no edema Abd:  +bowel sounds, soft, non-tender, no palpable organomegaly; no palpable hernias Musc:  Unable to assess gait; no apparent clubbing or cyanosis in extremities Lymphatic:  No palpable cervical or axillary lymphadenopathy Skin:  Warm, dry; no sign of jaundice Psychiatric - alert and oriented x 4; calm mood and affect     Labs, Imaging and Diagnostic Testing: CLINICAL DATA:  45 year old female with history of left breast abscess initially diagnosed 01/05/2021 with additional ultrasound performed 02/23/2021 demonstrating persistent collection in the retroareolar left breast and subsequent aspiration revealing abundant white blood cells.   Subsequently after the aspiration on 02/25/2021 the patient underwent surgical incision and drainage as well as biopsy in the OR. This revealed chronic inflammation and fibrosis of the skin without evidence of malignancy.   The patient states her left breast symptoms have never completely resolved. She does not think she has been on antibiotics in at least 2 months.   EXAM: DIGITAL DIAGNOSTIC BILATERAL MAMMOGRAM WITH TOMOSYNTHESIS AND CAD; ULTRASOUND LEFT BREAST LIMITED   TECHNIQUE: Bilateral digital diagnostic mammography and breast tomosynthesis was performed. The images were evaluated with computer-aided detection.; Targeted ultrasound examination of the left breast was performed   COMPARISON:  Prior chest CTs and left breast ultrasounds.   ACR Breast Density Category c: The breast tissue is heterogeneously dense, which may obscure small masses.   FINDINGS: There is bilateral skin and trabecular  thickening, left greater than right. Large tubular blood vessels are present coursing predominantly throughout the lateral portions of the breast bilaterally. No suspicious masses or calcifications are seen in the right breast. There is density and mild distortion in the immediate subareolar left breast with an area of central lucency seen on the MLO tomograms most suggestive fat necrosis/postsurgical change.   Physical examination reveals asymmetric skin thickening and erythema involving the left breast. There is skin thickening present in the right breast but to a much lesser extent. Central left breast is firm and extremely tender to palpation. There is flattening of the nipple with scarring involving the inner periareolar right breast related to previous incision and drainage. No purulent discharge identified. The patient is exquisitely tender to palpation.   Targeted ultrasound of the left breast was performed. There is marked skin thickening and subcutaneous edema predominantly involving the central and lower left breast. There is a large blood vessel coursing through the retroareolar left breast anterior depth. There is an irregular hypoechoic area/mass in the immediate retroareolar left breast measuring approximately 4.1 x 1.9 x 1.8 cm. This may be a combination of scarring and residual fluid however malignancy cannot be entirely excluded. A large blood vessel is present adjacent to this area.   There are 3-4 lymph nodes with uniformly thickened cortices in the lower left axilla, with the remainder of the left axillary lymph nodes normal in appearance. Several blood vessels are present surrounding these lymph nodes.   IMPRESSION: 1. Asymmetric skin and trabecular thickening involving the left breast with an irregular hypoechoic area/mass in the immediate retroareolar left breast. The constellation of these findings are felt to be related to cellulitis as well as scarring  and residual fluid although malignancy cannot be entirely excluded. When compared to the chest CT dated 01/05/2021 the findings are felt to be markedly improved (although it is difficult to compare across different modalities).   2. Several lymph nodes with thickened cortices in the left axilla potentially reactive.   RECOMMENDATION: Recommend an additional course of antibiotics  to see if this results in improvement of patient's symptoms and recommend the patient return for repeat left breast and axilla ultrasound in 4-6 weeks. If the patient continues to have persistent symptoms when she returns in 4 weeks, then recommend ultrasound-guided core biopsy of the hypoechoic area/mass in the immediate retroareolar left breast as well as biopsy of a lymph node in the left axilla (if this can safely be performed) to ensure benign pathology. If the patient cannot tolerate a breast procedure given extreme pain then surgical consultation would be recommended.   I have discussed the findings and recommendations with the patient. If applicable, a reminder letter will be sent to the patient regarding the next appointment.   BI-RADS CATEGORY  3: Probably benign.   Electronically Signed: By: Everlean Alstrom M.D. On: 06/17/2021 12:55   CLINICAL DATA:  45 year old female with history of left breast abscess initially diagnosed 01/05/2021 and additional ultrasound performed 02/23/2021 demonstrating persistent collection in the retroareolar left breast with subsequent abscess drain revealing abundant white blood cells. The patient did subsequently undergo I and D of left breast abscess twice with biopsies taken of both the abscess cavity and skin, all of which were negative for malignancy. The patient was seen for diagnostic mammography and ultrasound 06/17/2021 with findings revealing asymmetric skin and trabecular thickening involving the left breast with probable scarring in the immediate  retroareolar left breast. Several borderline lymph nodes were seen in the left axilla and felt to be reactive. Antibiotics and possible consideration of referral to Infectious Disease and short-term follow-up in 3 months was recommended.   The patient returns today with new onset left breast enlargement redness and pain with a palpable area of concern in the slightly inner left breast.   EXAM: DIGITAL DIAGNOSTIC UNILATERAL LEFT MAMMOGRAM WITH TOMOSYNTHESIS AND CAD; ULTRASOUND LEFT BREAST LIMITED   TECHNIQUE: Left digital diagnostic mammography and breast tomosynthesis was performed. The images were evaluated with computer-aided detection.; Targeted ultrasound examination of the left breast was performed.   COMPARISON:  Previous exams.   ACR Breast Density Category c: The breast tissue is heterogeneously dense, which may obscure small masses.   FINDINGS: There is marked skin and trabecular thickening, increased when compared to the prior exam. Multiple large serpiginous blood vessels are identified throughout the left breast compatible with patient's history of dialysis and central venous obstruction.   Physical examination reveals a firm mildly erythematous left breast, more cyst firm centrally with a fluctuant mass bulging the contour of the breast at 9 o'clock periareolar. The patient is exquisitely tender to palpation.   Targeted ultrasound of the left breast was performed. There is marked skin thickening with large heterogeneous collection in the left breast at 9 o'clock periareolar/retroareolar. This measures at least 5.4 x 2.3 x 4.5 cm. Several lymph nodes with thickened cortices are again identified in the left axilla, similar when compared to the prior exam.   IMPRESSION: 1.  Recurrent left breast abscess.   2. Persistent left axillary lymphadenopathy, favored to be reactive and related to chronic abscess however given persistence of lymphadenopathy tissue  sampling is warranted.   RECOMMENDATION: 1. Recommend attempt at ultrasound-guided aspiration of the abscess in the left breast at 9 o'clock periareolar/retroareolar. This will subsequently be performed and dictated separately.   2. Recommend treatment with antibiotics. The patient will be placed on Bactrim double strength.   3. Recommend surgical referral for the large recurrent/ongoing periareolar/subareolar left breast abscess (ongoing since January 2022).   4. Recommend ultrasound-guided  core biopsy of 1 of the abnormal lymph nodes in the left axilla following treatment of the patient's left breast abscess.   I have discussed the findings and recommendations with the patient. If applicable, a reminder letter will be sent to the patient regarding the next appointment.   BI-RADS CATEGORY  4: Suspicious.     Electronically Signed   By: Everlean Alstrom M.D.   On: 09/21/2021 12:05     Assessment and Plan:  Diagnoses and all orders for this visit:   Left breast abscess   Other orders -     oxyCODONE (ROXICODONE) 5 MG immediate release tablet; Take 1 tablet (5 mg total) by mouth every 6 (six) hours as needed for Pain     The patient continues to have recurrent left breast abscess in the retroareolar areolar region.  I think that she needs to have this area completely excised to remove all of the thickened granulation tissue.  The drain should be placed deep into the wound.  Wound care is very difficult for this patient as she is visually impaired and cannot perform her own dressing changes.  Her baseline medical issues also likely impair her wound healing.  We will try to perform this incision and drainage as soon as possible.  She is already on antibiotics.  I have refilled her pain medicine prescription today as this left breast is fairly tender.   No follow-ups on file.   Carlean Jews, MD  10/18/2021 9:36 AM

## 2021-10-18 NOTE — H&P (View-Only) (Signed)
Subjective    Chief Complaint: Follow-up (Breast abscess/)       History of Present Illness: Rebekah Burton is a 45 y.o. female who is seen today as an office consultation at the request of Dr. Pasty Arch for evaluation of Follow-up (Breast abscess/) .   This is a 45 year old female with end-stage renal disease who initially presented in January 2022 with left breast pain.  Ultrasound showed a subareolar abscess in the left breast and some enlarged left axillary lymph nodes.  She was started on antibiotics and was referred for ultrasound-guided aspiration.  Unfortunately, she never had the procedure performed and presented back to the emergency department on 01/15/2021 with drainage from an abscess.  She underwent surgical debridement and drainage on 01/15/2021 by Dr. Zenia Resides.   She had recurrence of this area in March and underwent further drainage.  Biopsies have all been negative for malignancy.  She underwent diagnostic mammogram in June that shows some persistent cellulitis.  7-month follow-up was recommended.  She underwent further mammogram and ultrasound of the left breast in September.  This showed a 5.4 cm retroareolar recurrent abscess.  This abscess was aspirated and the patient was placed on antibiotics.  Culture showed Proteus mirabilis.  She is now referred for formal surgical excision and management of this recurrent left breast abscess.  She has had 2 recent ER visits for drainage from the left breast.     Review of Systems: A complete review of systems was obtained from the patient.  I have reviewed this information and discussed as appropriate with the patient.  See HPI as well for other ROS.   Review of Systems  Constitutional: Negative.   HENT: Negative.   Eyes: Negative.   Respiratory: Negative.   Cardiovascular: Negative.   Gastrointestinal: Negative.   Genitourinary: Negative.   Musculoskeletal: Negative.   Skin: Negative.   Neurological: Negative.   Endo/Heme/Allergies:  Negative.   Psychiatric/Behavioral: Negative.         Medical History: Past Medical History      Past Medical History:  Diagnosis Date   ESRD (end stage renal disease) (CMS-HCC)     Hypertension     Legally blind     Peripheral arterial disease (CMS-HCC)             Patient Active Problem List  Diagnosis   Abscess of the breast and nipple   Acute blood loss anemia   Anemia   Blind in both eyes   Cellulitis of left lower extremity   Complication of vascular access for dialysis   COVID-19 virus infection   ESRD on dialysis (CMS-HCC)   HTN (hypertension)   Hypertension, essential, benign   Ischemic ulcer of left foot (CMS-HCC)   PAD (peripheral artery disease) (CMS-HCC)   Poor social situation   Post-operative pain   S/P transmetatarsal amputation of foot, left (CMS-HCC)   Uremia   Chronic pain syndrome   Dependence on renal dialysis (CMS-HCC)   History of tobacco use   Legal blindness, as defined in [place]   Pseudotumor cerebri   Anemia of chronic disease   Anemia of chronic renal failure   End stage renal disease (CMS-HCC)   Essential hypertension   Peripheral vascular disease (CMS-HCC)   Obesity      Past Surgical History       Past Surgical History:  Procedure Laterality Date   BYPASS GRAFT FEMORAL-POPLITEAL   01/18/2019   HYSTERECTOMY       INCISION AND DRAINAGE BREAST  ABSCESS Left     transmetatarsal amputation Left          Allergies      Allergies  Allergen Reactions   Cinacalcet Unknown              Current Outpatient Medications on File Prior to Visit  Medication Sig Dispense Refill   acetaminophen (TYLENOL) 325 MG tablet Take by mouth       amoxicillin-clavulanate (AUGMENTIN) 875-125 mg tablet Take 1 tablet (875 mg total) by mouth every 12 (twelve) hours for 28 days 56 tablet 0   azithromycin (ZITHROMAX) 250 MG tablet         carvediloL (COREG) 25 MG tablet 1 tab(s)       cloNIDine HCL (CATAPRES) 0.2 MG tablet 1 tab(s)       ibuprofen  (MOTRIN) 600 MG tablet TAKE 1 TABLET (600 MG TOTAL) BY MOUTH THREE TIMES DAILY.       lanthanum (FOSRENOL) 1000 MG chewable tablet CHEW AND SWALLOW 1 TABLET (1,000 MG TOTAL) BY MOUTH THREE TIMES DAILY WITH MEALS.       ondansetron (ZOFRAN-ODT) 4 MG disintegrating tablet         oxyCODONE (ROXICODONE) 5 MG immediate release tablet Take 1 tablet (5 mg total) by mouth every 4 (four) hours as needed for Pain 30 tablet 0   pantoprazole (PROTONIX) 40 MG DR tablet Take 40 mg by mouth once daily       pregabalin (LYRICA) 75 MG capsule TAKE 1 CAPSULE (75 MG TOTAL) BY MOUTH AT BEDTIME.       sennosides (SENOKOT) 8.6 mg tablet TAKE 1 TABLET (8.6 MG TOTAL) BY MOUTH 2 (TWO) TIMES DAILY.       sevelamer carbonate (RENVELA) 800 mg tablet 1 tab(s)       traZODone (DESYREL) 150 MG tablet          No current facility-administered medications on file prior to visit.      Family History  History reviewed. No pertinent family history.      Social History        Tobacco Use  Smoking Status Current Every Day Smoker   Packs/day: 0.50   Types: Cigarettes  Smokeless Tobacco Never Used      Social History  Social History         Socioeconomic History   Marital status: Single  Tobacco Use   Smoking status: Current Every Day Smoker      Packs/day: 0.50      Types: Cigarettes   Smokeless tobacco: Never Used  Scientific laboratory technician Use: Never used  Substance and Sexual Activity   Alcohol use: Not Currently   Drug use: Never        Objective:        Physical Exam    Constitutional:  WDWN in NAD, conversant, no obvious deformities; lying in bed comfortably Eyes:  Pupils equal, round; sclera anicteric; moist conjunctiva; no lid lag HENT:  Oral mucosa moist; good dentition  Neck:  No masses palpated, trachea midline; no thyromegaly Lungs:  CTA bilaterally; normal respiratory effort Breasts: Large, pendulous, bilateral signs of breast lymphedema: The right breast shows no sign of cellulitis,  fluctuance, or erythema.  The left breast is more tender and has a 1.5 cm opening medial to the nipple with some purulent drainage.  She has some healed surgical scars inferior laterally to the nipple.  This entire area is thickened.  There is no erythema. CV:  Regular rate and  rhythm; no murmurs; extremities well-perfused with no edema Abd:  +bowel sounds, soft, non-tender, no palpable organomegaly; no palpable hernias Musc:  Unable to assess gait; no apparent clubbing or cyanosis in extremities Lymphatic:  No palpable cervical or axillary lymphadenopathy Skin:  Warm, dry; no sign of jaundice Psychiatric - alert and oriented x 4; calm mood and affect     Labs, Imaging and Diagnostic Testing: CLINICAL DATA:  45 year old female with history of left breast abscess initially diagnosed 01/05/2021 with additional ultrasound performed 02/23/2021 demonstrating persistent collection in the retroareolar left breast and subsequent aspiration revealing abundant white blood cells.   Subsequently after the aspiration on 02/25/2021 the patient underwent surgical incision and drainage as well as biopsy in the OR. This revealed chronic inflammation and fibrosis of the skin without evidence of malignancy.   The patient states her left breast symptoms have never completely resolved. She does not think she has been on antibiotics in at least 2 months.   EXAM: DIGITAL DIAGNOSTIC BILATERAL MAMMOGRAM WITH TOMOSYNTHESIS AND CAD; ULTRASOUND LEFT BREAST LIMITED   TECHNIQUE: Bilateral digital diagnostic mammography and breast tomosynthesis was performed. The images were evaluated with computer-aided detection.; Targeted ultrasound examination of the left breast was performed   COMPARISON:  Prior chest CTs and left breast ultrasounds.   ACR Breast Density Category c: The breast tissue is heterogeneously dense, which may obscure small masses.   FINDINGS: There is bilateral skin and trabecular  thickening, left greater than right. Large tubular blood vessels are present coursing predominantly throughout the lateral portions of the breast bilaterally. No suspicious masses or calcifications are seen in the right breast. There is density and mild distortion in the immediate subareolar left breast with an area of central lucency seen on the MLO tomograms most suggestive fat necrosis/postsurgical change.   Physical examination reveals asymmetric skin thickening and erythema involving the left breast. There is skin thickening present in the right breast but to a much lesser extent. Central left breast is firm and extremely tender to palpation. There is flattening of the nipple with scarring involving the inner periareolar right breast related to previous incision and drainage. No purulent discharge identified. The patient is exquisitely tender to palpation.   Targeted ultrasound of the left breast was performed. There is marked skin thickening and subcutaneous edema predominantly involving the central and lower left breast. There is a large blood vessel coursing through the retroareolar left breast anterior depth. There is an irregular hypoechoic area/mass in the immediate retroareolar left breast measuring approximately 4.1 x 1.9 x 1.8 cm. This may be a combination of scarring and residual fluid however malignancy cannot be entirely excluded. A large blood vessel is present adjacent to this area.   There are 3-4 lymph nodes with uniformly thickened cortices in the lower left axilla, with the remainder of the left axillary lymph nodes normal in appearance. Several blood vessels are present surrounding these lymph nodes.   IMPRESSION: 1. Asymmetric skin and trabecular thickening involving the left breast with an irregular hypoechoic area/mass in the immediate retroareolar left breast. The constellation of these findings are felt to be related to cellulitis as well as scarring  and residual fluid although malignancy cannot be entirely excluded. When compared to the chest CT dated 01/05/2021 the findings are felt to be markedly improved (although it is difficult to compare across different modalities).   2. Several lymph nodes with thickened cortices in the left axilla potentially reactive.   RECOMMENDATION: Recommend an additional course of antibiotics  to see if this results in improvement of patient's symptoms and recommend the patient return for repeat left breast and axilla ultrasound in 4-6 weeks. If the patient continues to have persistent symptoms when she returns in 4 weeks, then recommend ultrasound-guided core biopsy of the hypoechoic area/mass in the immediate retroareolar left breast as well as biopsy of a lymph node in the left axilla (if this can safely be performed) to ensure benign pathology. If the patient cannot tolerate a breast procedure given extreme pain then surgical consultation would be recommended.   I have discussed the findings and recommendations with the patient. If applicable, a reminder letter will be sent to the patient regarding the next appointment.   BI-RADS CATEGORY  3: Probably benign.   Electronically Signed: By: Everlean Alstrom M.D. On: 06/17/2021 12:55   CLINICAL DATA:  45 year old female with history of left breast abscess initially diagnosed 01/05/2021 and additional ultrasound performed 02/23/2021 demonstrating persistent collection in the retroareolar left breast with subsequent abscess drain revealing abundant white blood cells. The patient did subsequently undergo I and D of left breast abscess twice with biopsies taken of both the abscess cavity and skin, all of which were negative for malignancy. The patient was seen for diagnostic mammography and ultrasound 06/17/2021 with findings revealing asymmetric skin and trabecular thickening involving the left breast with probable scarring in the immediate  retroareolar left breast. Several borderline lymph nodes were seen in the left axilla and felt to be reactive. Antibiotics and possible consideration of referral to Infectious Disease and short-term follow-up in 3 months was recommended.   The patient returns today with new onset left breast enlargement redness and pain with a palpable area of concern in the slightly inner left breast.   EXAM: DIGITAL DIAGNOSTIC UNILATERAL LEFT MAMMOGRAM WITH TOMOSYNTHESIS AND CAD; ULTRASOUND LEFT BREAST LIMITED   TECHNIQUE: Left digital diagnostic mammography and breast tomosynthesis was performed. The images were evaluated with computer-aided detection.; Targeted ultrasound examination of the left breast was performed.   COMPARISON:  Previous exams.   ACR Breast Density Category c: The breast tissue is heterogeneously dense, which may obscure small masses.   FINDINGS: There is marked skin and trabecular thickening, increased when compared to the prior exam. Multiple large serpiginous blood vessels are identified throughout the left breast compatible with patient's history of dialysis and central venous obstruction.   Physical examination reveals a firm mildly erythematous left breast, more cyst firm centrally with a fluctuant mass bulging the contour of the breast at 9 o'clock periareolar. The patient is exquisitely tender to palpation.   Targeted ultrasound of the left breast was performed. There is marked skin thickening with large heterogeneous collection in the left breast at 9 o'clock periareolar/retroareolar. This measures at least 5.4 x 2.3 x 4.5 cm. Several lymph nodes with thickened cortices are again identified in the left axilla, similar when compared to the prior exam.   IMPRESSION: 1.  Recurrent left breast abscess.   2. Persistent left axillary lymphadenopathy, favored to be reactive and related to chronic abscess however given persistence of lymphadenopathy tissue  sampling is warranted.   RECOMMENDATION: 1. Recommend attempt at ultrasound-guided aspiration of the abscess in the left breast at 9 o'clock periareolar/retroareolar. This will subsequently be performed and dictated separately.   2. Recommend treatment with antibiotics. The patient will be placed on Bactrim double strength.   3. Recommend surgical referral for the large recurrent/ongoing periareolar/subareolar left breast abscess (ongoing since January 2022).   4. Recommend ultrasound-guided  core biopsy of 1 of the abnormal lymph nodes in the left axilla following treatment of the patient's left breast abscess.   I have discussed the findings and recommendations with the patient. If applicable, a reminder letter will be sent to the patient regarding the next appointment.   BI-RADS CATEGORY  4: Suspicious.     Electronically Signed   By: Everlean Alstrom M.D.   On: 09/21/2021 12:05     Assessment and Plan:  Diagnoses and all orders for this visit:   Left breast abscess   Other orders -     oxyCODONE (ROXICODONE) 5 MG immediate release tablet; Take 1 tablet (5 mg total) by mouth every 6 (six) hours as needed for Pain     The patient continues to have recurrent left breast abscess in the retroareolar areolar region.  I think that she needs to have this area completely excised to remove all of the thickened granulation tissue.  The drain should be placed deep into the wound.  Wound care is very difficult for this patient as she is visually impaired and cannot perform her own dressing changes.  Her baseline medical issues also likely impair her wound healing.  We will try to perform this incision and drainage as soon as possible.  She is already on antibiotics.  I have refilled her pain medicine prescription today as this left breast is fairly tender.   No follow-ups on file.   Carlean Jews, MD  10/18/2021 9:36 AM

## 2021-10-19 DIAGNOSIS — I10 Essential (primary) hypertension: Secondary | ICD-10-CM | POA: Diagnosis not present

## 2021-10-20 DIAGNOSIS — Z992 Dependence on renal dialysis: Secondary | ICD-10-CM | POA: Diagnosis not present

## 2021-10-20 DIAGNOSIS — I10 Essential (primary) hypertension: Secondary | ICD-10-CM | POA: Diagnosis not present

## 2021-10-20 DIAGNOSIS — N186 End stage renal disease: Secondary | ICD-10-CM | POA: Diagnosis not present

## 2021-10-20 DIAGNOSIS — K219 Gastro-esophageal reflux disease without esophagitis: Secondary | ICD-10-CM | POA: Diagnosis not present

## 2021-10-20 DIAGNOSIS — I259 Chronic ischemic heart disease, unspecified: Secondary | ICD-10-CM | POA: Diagnosis not present

## 2021-10-20 DIAGNOSIS — I739 Peripheral vascular disease, unspecified: Secondary | ICD-10-CM | POA: Diagnosis not present

## 2021-10-20 DIAGNOSIS — N2581 Secondary hyperparathyroidism of renal origin: Secondary | ICD-10-CM | POA: Diagnosis not present

## 2021-10-20 DIAGNOSIS — Z23 Encounter for immunization: Secondary | ICD-10-CM | POA: Diagnosis not present

## 2021-10-21 DIAGNOSIS — I10 Essential (primary) hypertension: Secondary | ICD-10-CM | POA: Diagnosis not present

## 2021-10-22 DIAGNOSIS — Z992 Dependence on renal dialysis: Secondary | ICD-10-CM | POA: Diagnosis not present

## 2021-10-22 DIAGNOSIS — Z23 Encounter for immunization: Secondary | ICD-10-CM | POA: Diagnosis not present

## 2021-10-22 DIAGNOSIS — N186 End stage renal disease: Secondary | ICD-10-CM | POA: Diagnosis not present

## 2021-10-22 DIAGNOSIS — N2581 Secondary hyperparathyroidism of renal origin: Secondary | ICD-10-CM | POA: Diagnosis not present

## 2021-10-23 ENCOUNTER — Encounter (HOSPITAL_COMMUNITY): Payer: Self-pay | Admitting: Surgery

## 2021-10-23 NOTE — Progress Notes (Signed)
Pt. Resides @ [facility] in [place]  (on [address] Spoke with pt., updated history. Not able to go over meds, states the facility gives them to her and our pharmacy hasn't received up dated list. Discusses with pt. No solids after midnight, but clear liquids until 0715 the day of surgery.  Dialysis M-W-F @ [facility] in [facility] [address], director [phone], per pt. Instructed pt. To arrive 0715 10/26/21. No solid food after midnight,but clear liquids til 0715. She wasn't able to get to computer to pull up pt's meds, I told her I would call back in 10 mins. I tried calling back x2 and it went straight to voice mail, left message for her to return my call.. Fax  number [phone].

## 2021-10-25 DIAGNOSIS — Z992 Dependence on renal dialysis: Secondary | ICD-10-CM | POA: Diagnosis not present

## 2021-10-25 DIAGNOSIS — Z23 Encounter for immunization: Secondary | ICD-10-CM | POA: Diagnosis not present

## 2021-10-25 DIAGNOSIS — N186 End stage renal disease: Secondary | ICD-10-CM | POA: Diagnosis not present

## 2021-10-25 DIAGNOSIS — N2581 Secondary hyperparathyroidism of renal origin: Secondary | ICD-10-CM | POA: Diagnosis not present

## 2021-10-25 NOTE — Progress Notes (Signed)
SDW call completed by C. Tamala Julian, RN on 10/23/21.  Cathy reviewed instructions for DOS except the meds.  Pharmacy to updated medications today, 10/25/21.  Instructions were faxed to Jerrol Banana, Director of [facility] in [place], [place].

## 2021-10-25 NOTE — Progress Notes (Addendum)
Surgical Instructions for Rebekah Burton    Your procedure is scheduled on Tuesday, 10/26/21.  Report to [facility] Main Entrance "A" at [place].M., then check in with the Admitting office.  Call this number if you have problems the morning of surgery:  [phone]   If you have any questions prior to your surgery date call [phone]: Open Monday-Friday 8am-4pm    Remember:  Do not eat after midnight the night before your surgery  You may drink clear liquids until 7 am the morning of your surgery.   Clear liquids allowed are: Water, Non-Citrus Juices (without pulp), Carbonated Beverages, Clear Tea, Black Coffee ONLY (NO MILK, CREAM OR POWDERED CREAMER of any kind), and Gatorade    Take these medicines the morning of surgery with A SIP OF WATER  acetaminophen (TYLENOL) if needed amoxicillin-clavulanate (AUGMENTIN)  carvedilol (COREG)  cloNIDine (CATAPRES) pantoprazole (PROTONIX) oxyCODONE if needed  As of today, STOP taking any Aspirin (unless otherwise instructed by your surgeon) Aleve, Naproxen, Ibuprofen, Motrin, Advil, Goody's, BC's, all herbal medications, fish oil, and all vitamins.  Do not wear jewelry or makeup Do not wear lotions, powders, perfumes, or deodorant. Do not shave 48 hours prior to surgery.   Do not bring valuables to the hospital. DO Not wear nail polish, gel polish, artificial nails, or any other type of covering on natural nails including finger and toenails. If patients have artificial nails, gel coating, etc. that need to be removed by a nail salon, please have this removed prior to surgery or surgery may need to be canceled/delayed if the surgeon/ anesthesia feels like the patient is unable to be adequately monitored.             [facility] is not responsible for any belongings or valuables.  Do NOT Smoke (Tobacco/Vaping)  24 hours prior to your procedure  If you use a CPAP at night, you may bring your mask for your overnight stay.   Contacts,  glasses, hearing aids, dentures or partials may not be worn into surgery, please bring cases for these belongings   For patients admitted to the hospital, discharge time will be determined by your treatment team.   Patients discharged the day of surgery will not be allowed to drive home, and someone needs to stay with them for 24 hours.  NO VISITORS WILL BE ALLOWED IN PRE-OP WHERE PATIENTS ARE PREPPED FOR SURGERY.  ONLY 1 SUPPORT PERSON MAY BE PRESENT IN THE WAITING ROOM WHILE YOU ARE IN SURGERY.  IF YOU ARE TO BE ADMITTED, ONCE YOU ARE IN YOUR ROOM YOU WILL BE ALLOWED TWO (2) VISITORS. 1 (ONE) VISITOR MAY STAY OVERNIGHT BUT MUST ARRIVE TO THE ROOM BY 8pm.  Minor children may have two parents present. Special consideration for safety and communication needs will be reviewed on a case by case basis.  Special instructions:    Oral Hygiene is also important to reduce your risk of infection.  Remember - BRUSH YOUR TEETH THE MORNING OF SURGERY WITH YOUR REGULAR TOOTHPASTE

## 2021-10-26 ENCOUNTER — Ambulatory Visit (HOSPITAL_COMMUNITY)
Admission: RE | Admit: 2021-10-26 | Discharge: 2021-10-26 | Disposition: A | Discharge status: Home / Self Care | Payer: Medicare HMO | Source: Ambulatory Visit | Attending: Surgery | Admitting: Surgery

## 2021-10-26 ENCOUNTER — Encounter (HOSPITAL_COMMUNITY): Payer: Self-pay | Admitting: Surgery

## 2021-10-26 ENCOUNTER — Ambulatory Visit (HOSPITAL_COMMUNITY): Payer: Medicare HMO | Admitting: Certified Registered Nurse Anesthetist

## 2021-10-26 ENCOUNTER — Other Ambulatory Visit: Payer: Self-pay

## 2021-10-26 ENCOUNTER — Encounter (HOSPITAL_COMMUNITY)
Admission: RE | Disposition: A | Discharge status: Home / Self Care | Payer: Self-pay | Source: Ambulatory Visit | Attending: Surgery

## 2021-10-26 DIAGNOSIS — Z992 Dependence on renal dialysis: Secondary | ICD-10-CM | POA: Diagnosis not present

## 2021-10-26 DIAGNOSIS — I739 Peripheral vascular disease, unspecified: Secondary | ICD-10-CM | POA: Diagnosis not present

## 2021-10-26 DIAGNOSIS — I120 Hypertensive chronic kidney disease with stage 5 chronic kidney disease or end stage renal disease: Secondary | ICD-10-CM | POA: Diagnosis not present

## 2021-10-26 DIAGNOSIS — H548 Legal blindness, as defined in USA: Secondary | ICD-10-CM | POA: Diagnosis not present

## 2021-10-26 DIAGNOSIS — D631 Anemia in chronic kidney disease: Secondary | ICD-10-CM | POA: Insufficient documentation

## 2021-10-26 DIAGNOSIS — E669 Obesity, unspecified: Secondary | ICD-10-CM | POA: Diagnosis not present

## 2021-10-26 DIAGNOSIS — N186 End stage renal disease: Secondary | ICD-10-CM | POA: Diagnosis not present

## 2021-10-26 DIAGNOSIS — Z79899 Other long term (current) drug therapy: Secondary | ICD-10-CM | POA: Diagnosis not present

## 2021-10-26 DIAGNOSIS — F17210 Nicotine dependence, cigarettes, uncomplicated: Secondary | ICD-10-CM | POA: Diagnosis not present

## 2021-10-26 DIAGNOSIS — Z888 Allergy status to other drugs, medicaments and biological substances status: Secondary | ICD-10-CM | POA: Diagnosis not present

## 2021-10-26 DIAGNOSIS — N611 Abscess of the breast and nipple: Secondary | ICD-10-CM | POA: Diagnosis not present

## 2021-10-26 DIAGNOSIS — Z6834 Body mass index (BMI) 34.0-34.9, adult: Secondary | ICD-10-CM | POA: Insufficient documentation

## 2021-10-26 DIAGNOSIS — Z8616 Personal history of COVID-19: Secondary | ICD-10-CM | POA: Diagnosis not present

## 2021-10-26 DIAGNOSIS — Z791 Long term (current) use of non-steroidal anti-inflammatories (NSAID): Secondary | ICD-10-CM | POA: Diagnosis not present

## 2021-10-26 DIAGNOSIS — I10 Essential (primary) hypertension: Secondary | ICD-10-CM | POA: Diagnosis not present

## 2021-10-26 DIAGNOSIS — L929 Granulomatous disorder of the skin and subcutaneous tissue, unspecified: Secondary | ICD-10-CM | POA: Diagnosis not present

## 2021-10-26 HISTORY — PX: INCISION AND DRAINAGE ABSCESS: SHX5864

## 2021-10-26 HISTORY — DX: Anemia, unspecified: D64.9

## 2021-10-26 LAB — POCT I-STAT, CHEM 8
BUN: 19 mg/dL (ref 6–20)
Calcium, Ion: 1.12 mmol/L — ABNORMAL LOW (ref 1.15–1.40)
Chloride: 95 mmol/L — ABNORMAL LOW (ref 98–111)
Creatinine, Ser: 9.1 mg/dL — ABNORMAL HIGH (ref 0.44–1.00)
Glucose, Bld: 108 mg/dL — ABNORMAL HIGH (ref 70–99)
HCT: 28 % — ABNORMAL LOW (ref 36.0–46.0)
Hemoglobin: 9.5 g/dL — ABNORMAL LOW (ref 12.0–15.0)
Potassium: 3.2 mmol/L — ABNORMAL LOW (ref 3.5–5.1)
Sodium: 136 mmol/L (ref 135–145)
TCO2: 29 mmol/L (ref 22–32)

## 2021-10-26 LAB — SARS CORONAVIRUS 2 BY RT PCR (HOSPITAL ORDER, PERFORMED IN ~~LOC~~ HOSPITAL LAB): SARS Coronavirus 2: NEGATIVE

## 2021-10-26 SURGERY — INCISION AND DRAINAGE, ABSCESS
Anesthesia: General | Site: Breast | Laterality: Left

## 2021-10-26 MED ORDER — ONDANSETRON HCL 4 MG/2ML IJ SOLN
INTRAMUSCULAR | Status: DC | PRN
  Administered 2021-10-26: 4 mg via INTRAVENOUS

## 2021-10-26 MED ORDER — PHENYLEPHRINE 40 MCG/ML (10ML) SYRINGE FOR IV PUSH (FOR BLOOD PRESSURE SUPPORT)
PREFILLED_SYRINGE | INTRAVENOUS | Status: DC | PRN
  Administered 2021-10-26: 40 ug via INTRAVENOUS
  Administered 2021-10-26 (×4): 80 ug via INTRAVENOUS
  Administered 2021-10-26: 120 ug via INTRAVENOUS
  Administered 2021-10-26: 80 ug via INTRAVENOUS
  Administered 2021-10-26: 40 ug via INTRAVENOUS

## 2021-10-26 MED ORDER — 0.9 % SODIUM CHLORIDE (POUR BTL) OPTIME
TOPICAL | Status: DC | PRN
  Administered 2021-10-26: 1000 mL

## 2021-10-26 MED ORDER — FENTANYL CITRATE (PF) 250 MCG/5ML IJ SOLN
INTRAMUSCULAR | Status: AC
  Filled 2021-10-26: qty 5

## 2021-10-26 MED ORDER — BUPIVACAINE-EPINEPHRINE (PF) 0.25% -1:200000 IJ SOLN
INTRAMUSCULAR | Status: AC
  Filled 2021-10-26: qty 30

## 2021-10-26 MED ORDER — LIDOCAINE 2% (20 MG/ML) 5 ML SYRINGE
INTRAMUSCULAR | Status: AC
  Filled 2021-10-26: qty 5

## 2021-10-26 MED ORDER — LIDOCAINE 2% (20 MG/ML) 5 ML SYRINGE
INTRAMUSCULAR | Status: DC | PRN
  Administered 2021-10-26: 100 mg via INTRAVENOUS

## 2021-10-26 MED ORDER — MIDAZOLAM HCL 2 MG/2ML IJ SOLN
INTRAMUSCULAR | Status: AC
  Filled 2021-10-26: qty 2

## 2021-10-26 MED ORDER — ORAL CARE MOUTH RINSE: 15.0000 mL | Freq: Once | OROMUCOSAL | Status: AC

## 2021-10-26 MED ORDER — BUPIVACAINE-EPINEPHRINE 0.25% -1:200000 IJ SOLN
INTRAMUSCULAR | Status: DC | PRN
  Administered 2021-10-26: 10 mL

## 2021-10-26 MED ORDER — PROPOFOL 10 MG/ML IV BOLUS
INTRAVENOUS | Status: DC | PRN
  Administered 2021-10-26: 200 mg via INTRAVENOUS

## 2021-10-26 MED ORDER — OXYCODONE HCL 5 MG PO TABS: 5.0000 mg | ORAL_TABLET | Freq: Four times a day (QID) | ORAL | 0 refills | Status: DC | PRN

## 2021-10-26 MED ORDER — FENTANYL CITRATE (PF) 100 MCG/2ML IJ SOLN
25.0000 ug | INTRAMUSCULAR | Status: DC | PRN
  Administered 2021-10-26 (×2): 50 ug via INTRAVENOUS

## 2021-10-26 MED ORDER — ACETAMINOPHEN 500 MG PO TABS: 1000.0000 mg | ORAL_TABLET | ORAL | Status: AC

## 2021-10-26 MED ORDER — CEFAZOLIN SODIUM-DEXTROSE 2-4 GM/100ML-% IV SOLN
INTRAVENOUS | Status: AC
  Filled 2021-10-26: qty 100

## 2021-10-26 MED ORDER — OXYCODONE HCL 5 MG PO TABS: 5.0000 mg | ORAL_TABLET | Freq: Once | ORAL | Status: DC | PRN

## 2021-10-26 MED ORDER — CEFAZOLIN SODIUM-DEXTROSE 2-4 GM/100ML-% IV SOLN
2.0000 g | INTRAVENOUS | Status: AC
  Administered 2021-10-26: 2 g via INTRAVENOUS

## 2021-10-26 MED ORDER — SODIUM CHLORIDE 0.9 % IV SOLN: INTRAVENOUS | Status: DC

## 2021-10-26 MED ORDER — ONDANSETRON HCL 4 MG/2ML IJ SOLN: 4.0000 mg | Freq: Once | INTRAMUSCULAR | Status: DC | PRN

## 2021-10-26 MED ORDER — OXYCODONE HCL 5 MG/5ML PO SOLN
5.0000 mg | Freq: Once | ORAL | Status: DC | PRN
Start: 2021-10-26 — End: 2021-10-26

## 2021-10-26 MED ORDER — PHENYLEPHRINE 40 MCG/ML (10ML) SYRINGE FOR IV PUSH (FOR BLOOD PRESSURE SUPPORT)
PREFILLED_SYRINGE | INTRAVENOUS | Status: AC
  Filled 2021-10-26: qty 20

## 2021-10-26 MED ORDER — FENTANYL CITRATE (PF) 100 MCG/2ML IJ SOLN
INTRAMUSCULAR | Status: DC | PRN
  Administered 2021-10-26 (×2): 25 ug via INTRAVENOUS

## 2021-10-26 MED ORDER — CHLORHEXIDINE GLUCONATE 0.12 % MT SOLN
OROMUCOSAL | Status: AC
  Administered 2021-10-26: 15 mL via OROMUCOSAL
  Filled 2021-10-26: qty 15

## 2021-10-26 MED ORDER — PROPOFOL 10 MG/ML IV BOLUS
INTRAVENOUS | Status: AC
  Filled 2021-10-26: qty 40

## 2021-10-26 MED ORDER — FENTANYL CITRATE (PF) 100 MCG/2ML IJ SOLN
INTRAMUSCULAR | Status: AC
  Filled 2021-10-26: qty 2

## 2021-10-26 MED ORDER — CHLORHEXIDINE GLUCONATE CLOTH 2 % EX PADS: 6.0000 | MEDICATED_PAD | Freq: Once | CUTANEOUS | Status: DC

## 2021-10-26 MED ORDER — CHLORHEXIDINE GLUCONATE 0.12 % MT SOLN: 15.0000 mL | Freq: Once | OROMUCOSAL | Status: AC

## 2021-10-26 MED ORDER — ALBUMIN HUMAN 5 % IV SOLN
INTRAVENOUS | Status: DC | PRN
Start: 2021-10-26 — End: 2021-10-26

## 2021-10-26 MED ORDER — ACETAMINOPHEN 500 MG PO TABS
ORAL_TABLET | ORAL | Status: AC
  Administered 2021-10-26: 1000 mg via ORAL
  Filled 2021-10-26: qty 2

## 2021-10-26 MED ORDER — ONDANSETRON HCL 4 MG/2ML IJ SOLN
INTRAMUSCULAR | Status: AC
  Filled 2021-10-26: qty 2

## 2021-10-26 SURGICAL SUPPLY — 26 items
BAG COUNTER SPONGE SURGICOUNT (BAG) ×2 IMPLANT
CANISTER SUCT 3000ML PPV (MISCELLANEOUS) ×2 IMPLANT
COVER SURGICAL LIGHT HANDLE (MISCELLANEOUS) ×2 IMPLANT
DRAPE LAPAROTOMY 100X72 PEDS (DRAPES) ×2 IMPLANT
ELECT REM PT RETURN 9FT ADLT (ELECTROSURGICAL) ×2
ELECTRODE REM PT RTRN 9FT ADLT (ELECTROSURGICAL) ×1 IMPLANT
GAUZE 4X4 16PLY ~~LOC~~+RFID DBL (SPONGE) ×2 IMPLANT
GAUZE SPONGE 4X4 12PLY STRL (GAUZE/BANDAGES/DRESSINGS) ×2 IMPLANT
GLOVE SURG ENC MOIS LTX SZ7 (GLOVE) ×2 IMPLANT
GLOVE SURG UNDER POLY LF SZ7.5 (GLOVE) ×2 IMPLANT
GOWN STRL REUS W/ TWL LRG LVL3 (GOWN DISPOSABLE) ×2 IMPLANT
GOWN STRL REUS W/TWL LRG LVL3 (GOWN DISPOSABLE) ×2
KIT BASIN OR (CUSTOM PROCEDURE TRAY) ×2 IMPLANT
KIT TURNOVER KIT B (KITS) ×2 IMPLANT
NS IRRIG 1000ML POUR BTL (IV SOLUTION) ×2 IMPLANT
PACK GENERAL/GYN (CUSTOM PROCEDURE TRAY) ×2 IMPLANT
PAD ARMBOARD 7.5X6 YLW CONV (MISCELLANEOUS) ×2 IMPLANT
PENCIL SMOKE EVACUATOR (MISCELLANEOUS) ×2 IMPLANT
SHEARS FOC LG CVD HARMONIC 17C (MISCELLANEOUS) ×2 IMPLANT
SPONGE T-LAP 18X18 ~~LOC~~+RFID (SPONGE) ×4 IMPLANT
SPONGE T-LAP 4X18 ~~LOC~~+RFID (SPONGE) ×4 IMPLANT
SUT SILK 2 0 SH CR/8 (SUTURE) ×2 IMPLANT
SUT SILK 2 0SH CR/8 30 (SUTURE) ×2 IMPLANT
SYR CONTROL 10ML LL (SYRINGE) ×2 IMPLANT
TOWEL GREEN STERILE (TOWEL DISPOSABLE) ×2 IMPLANT
TOWEL GREEN STERILE FF (TOWEL DISPOSABLE) ×2 IMPLANT

## 2021-10-26 NOTE — Interval H&P Note (Signed)
History and Physical Interval Note:  10/26/2021 8:22 AM  Rebekah Burton  has presented today for surgery, with the diagnosis of RECURRENT LEFT BREAST ABSCESS.  The various methods of treatment have been discussed with the patient and family. After consideration of risks, benefits and other options for treatment, the patient has consented to  Procedure(s): INCISION AND DRAINAGE OF RECURRENT LEFT BREAST ABSCESS (Left) as a surgical intervention.  The patient's history has been reviewed, patient examined, no change in status, stable for surgery.  I have reviewed the patient's chart and labs.  Questions were answered to the patient's satisfaction.     [name]

## 2021-10-26 NOTE — Interval H&P Note (Signed)
History and Physical Interval Note:  10/26/2021 7:35 AM  Rebekah Burton  has presented today for surgery, with the diagnosis of RECURRENT LEFT BREAST ABSCESS.  The various methods of treatment have been discussed with the patient and family. After consideration of risks, benefits and other options for treatment, the patient has consented to  Procedure(s): INCISION AND DRAINAGE OF RECURRENT LEFT BREAST ABSCESS (Left) as a surgical intervention.  The patient's history has been reviewed, patient examined, no change in status, stable for surgery.  I have reviewed the patient's chart and labs.  Questions were answered to the patient's satisfaction.     [name]

## 2021-10-26 NOTE — Anesthesia Preprocedure Evaluation (Signed)
Anesthesia Evaluation  Patient identified by MRN, date of birth, ID band Patient awake    Reviewed: Allergy & Precautions, NPO status , Patient's Chart, lab work & pertinent test results, reviewed documented beta blocker date and time   History of Anesthesia Complications Negative for: history of anesthetic complications  Airway Mallampati: III  TM Distance: >3 FB Neck ROM: Full    Dental  (+) Teeth Intact, Dental Advisory Given   Pulmonary neg pulmonary ROS, former smoker,    Pulmonary exam normal        Cardiovascular hypertension, Pt. on medications and Pt. on home beta blockers + Peripheral Vascular Disease  Normal cardiovascular exam     Neuro/Psych negative neurological ROS     GI/Hepatic negative GI ROS, Neg liver ROS,   Endo/Other  negative endocrine ROS  Renal/GU ESRF and DialysisRenal diseaseLast dialysis yesterday K 3.2  negative genitourinary   Musculoskeletal negative musculoskeletal ROS (+)   Abdominal   Peds  Hematology  (+) anemia ,   Anesthesia Other Findings   Reproductive/Obstetrics                             Anesthesia Physical Anesthesia Plan  ASA: 3  Anesthesia Plan: General   Post-op Pain Management:    Induction: Intravenous  PONV Risk Score and Plan: 3 and Ondansetron, Dexamethasone, Midazolam and Treatment may vary due to age or medical condition  Airway Management Planned: LMA  Additional Equipment: None  Intra-op Plan:   Post-operative Plan: Extubation in OR  Informed Consent: I have reviewed the patients History and Physical, chart, labs and discussed the procedure including the risks, benefits and alternatives for the proposed anesthesia with the patient or authorized representative who has indicated his/her understanding and acceptance.     Dental advisory given  Plan Discussed with:   Anesthesia Plan Comments:          Anesthesia Quick Evaluation

## 2021-10-26 NOTE — Transfer of Care (Signed)
Immediate Anesthesia Transfer of Care Note  Patient: Rebekah Burton  Procedure(s) Performed: INCISION AND DRAINAGE OF RECURRENT LEFT BREAST ABSCESS (Left: Breast)  Patient Location: PACU  Anesthesia Type:General  Level of Consciousness: awake, alert  and oriented  Airway & Oxygen Therapy: Patient Spontanous Breathing, face mask  Post-op Assessment: Report given to RN and Post -op Vital signs reviewed and stable  Post vital signs: Reviewed and stable  Last Vitals:  Vitals Value Taken Time  BP 100/61 10/26/21 1130  Temp 36.6 C 10/26/21 1100  Pulse 68 10/26/21 1136  Resp 22 10/26/21 1136  SpO2 95 % 10/26/21 1136  Vitals shown include unvalidated device data.  Last Pain:  Vitals:   10/26/21 1126  TempSrc:   PainSc: 7       Patients Stated Pain Goal: 0 (96/78/93 8101)  Complications: No notable events documented.

## 2021-10-26 NOTE — Anesthesia Postprocedure Evaluation (Signed)
Anesthesia Post Note  Patient: Elvera Almario  Procedure(s) Performed: INCISION AND DRAINAGE OF RECURRENT LEFT BREAST ABSCESS (Left: Breast)     Patient location during evaluation: PACU Anesthesia Type: General Level of consciousness: awake and alert Pain management: pain level controlled Vital Signs Assessment: post-procedure vital signs reviewed and stable Respiratory status: spontaneous breathing, nonlabored ventilation and respiratory function stable Cardiovascular status: blood pressure returned to baseline and stable Postop Assessment: no apparent nausea or vomiting Anesthetic complications: no   No notable events documented.  Last Vitals:  Vitals:   10/26/21 1130 10/26/21 1145  BP: 100/61 104/64  Pulse: 66 66  Resp: 16 12  Temp:  36.6 C  SpO2: 98% 100%    Last Pain:  Vitals:   10/26/21 1126  TempSrc:   PainSc: 7                  Margareta Laureano E Dak Szumski

## 2021-10-26 NOTE — Op Note (Signed)
Preop diagnosis: Chronic recurrent left breast abscess Postop diagnosis: Same Procedure performed: Excision of chronic left breast abscess with placement of Penrose drain Surgeon:Aaleigha Bozza K Emmalene Kattner Anesthesia: General Indications:  This is a 45 year old female with end-stage renal disease who initially presented in January 2022 with left breast pain.  Ultrasound showed a subareolar abscess in the left breast and some enlarged left axillary lymph nodes.  She was started on antibiotics and was referred for ultrasound-guided aspiration.  Unfortunately, she never had the procedure performed and presented back to the emergency department on 01/15/2021 with drainage from an abscess.  She underwent surgical debridement and drainage on 01/15/2021 by Dr. Zenia Resides.   She had recurrence of this area in March and underwent further drainage.  Biopsies have all been negative for malignancy.  She underwent diagnostic mammogram in June that shows some persistent cellulitis.  33-month follow-up was recommended.  She underwent further mammogram and ultrasound of the left breast in September.  This showed a 5.4 cm retroareolar recurrent abscess.  This abscess was aspirated and the patient was placed on antibiotics.  Culture showed Proteus mirabilis.  She is now referred for formal surgical excision and management of this recurrent left breast abscess.  She has had 2 recent ER visits for drainage from the left breast.  Description of procedure: The patient is brought to the operating room and placed in the supine position on the operating room table.  After an adequate level of general anesthesia was obtained, her left breast was prepped with Betadine and draped in sterile fashion.  A timeout was taken to ensure the proper patient and proper procedure.  She has a 1 cm open wound at the edge of the areola medially.  The entire central breast is thick and indurated.  I made an elliptical incision around the edge of the areola including  the previous wound.  We dissected through the thickened dermis into the breast tissue.  We immediately encountered a very large superficial vein.  The vein measured approximately 11 mm across.  There was copious bleeding that was quickly controlled with hemostats.  The vein was ligated with silk sutures.  I then finished the excision of the chronic inflamed granulation tissue with the harmonic scalpel.  We excised some necrotic tissue as well as a very small amount of purulent fluid.  There is a lot of induration but the abscess that we encountered measures only about 2 cm in diameter.  This is in the left retroareolar region.  I thoroughly explored the wound and we did not find any other purulent necrotic areas.  I irrigated the wound thoroughly.  We placed 1/2 inch Penrose drain deep into the wound and secured it with 2-0 nylon.  We infiltrated the area around the wound with local anesthetic.  The skin incision was loosely reapproximated with multiple interrupted 2-0 nylon sutures.  A dry dressing is applied.  The patient was extubated and brought to the recovery room in stable condition.  All sponge, instrument, and needle counts are correct.  Imogene Burn. Georgette Dover, MD, [facility]  General Surgery   10/26/2021 11:03 AM

## 2021-10-26 NOTE — Discharge Instructions (Signed)
Dry gauze over the wound and drain - change this daily and as needed. You may shower over the wound with the dressing in place - change dressing after shower Expect a significant amount of drainage for the first few days. Take the pain medication as needed. Finish your course of antibiotics

## 2021-10-26 NOTE — Anesthesia Procedure Notes (Signed)
Procedure Name: LMA Insertion Date/Time: 10/26/2021 10:04 AM Performed by: Alain Marion, CRNA Pre-anesthesia Checklist: Patient identified, Emergency Drugs available, Suction available and Patient being monitored Patient Re-evaluated:Patient Re-evaluated prior to induction Oxygen Delivery Method: Circle System Utilized Preoxygenation: Pre-oxygenation with 100% oxygen Induction Type: IV induction LMA: LMA inserted LMA Size: 4.0 Number of attempts: 1 Airway Equipment and Method: Bite block Placement Confirmation: positive ETCO2 Tube secured with: Tape Dental Injury: Teeth and Oropharynx as per pre-operative assessment

## 2021-10-27 ENCOUNTER — Encounter (HOSPITAL_COMMUNITY): Payer: Self-pay | Admitting: Surgery

## 2021-10-27 DIAGNOSIS — I10 Essential (primary) hypertension: Secondary | ICD-10-CM | POA: Diagnosis not present

## 2021-10-27 LAB — SURGICAL PATHOLOGY

## 2021-10-28 DIAGNOSIS — I10 Essential (primary) hypertension: Secondary | ICD-10-CM | POA: Diagnosis not present

## 2021-10-29 DIAGNOSIS — N2581 Secondary hyperparathyroidism of renal origin: Secondary | ICD-10-CM | POA: Diagnosis not present

## 2021-10-29 DIAGNOSIS — Z992 Dependence on renal dialysis: Secondary | ICD-10-CM | POA: Diagnosis not present

## 2021-10-29 DIAGNOSIS — I10 Essential (primary) hypertension: Secondary | ICD-10-CM | POA: Diagnosis not present

## 2021-10-29 DIAGNOSIS — N186 End stage renal disease: Secondary | ICD-10-CM | POA: Diagnosis not present

## 2021-10-30 DIAGNOSIS — I10 Essential (primary) hypertension: Secondary | ICD-10-CM | POA: Diagnosis not present

## 2021-10-31 DIAGNOSIS — I10 Essential (primary) hypertension: Secondary | ICD-10-CM | POA: Diagnosis not present

## 2021-11-01 DIAGNOSIS — I10 Essential (primary) hypertension: Secondary | ICD-10-CM | POA: Diagnosis not present

## 2021-11-01 DIAGNOSIS — Z992 Dependence on renal dialysis: Secondary | ICD-10-CM | POA: Diagnosis not present

## 2021-11-01 DIAGNOSIS — N186 End stage renal disease: Secondary | ICD-10-CM | POA: Diagnosis not present

## 2021-11-01 DIAGNOSIS — N2581 Secondary hyperparathyroidism of renal origin: Secondary | ICD-10-CM | POA: Diagnosis not present

## 2021-11-02 DIAGNOSIS — I10 Essential (primary) hypertension: Secondary | ICD-10-CM | POA: Diagnosis not present

## 2021-11-03 DIAGNOSIS — Z992 Dependence on renal dialysis: Secondary | ICD-10-CM | POA: Diagnosis not present

## 2021-11-03 DIAGNOSIS — I10 Essential (primary) hypertension: Secondary | ICD-10-CM | POA: Diagnosis not present

## 2021-11-03 DIAGNOSIS — N186 End stage renal disease: Secondary | ICD-10-CM | POA: Diagnosis not present

## 2021-11-03 DIAGNOSIS — N2581 Secondary hyperparathyroidism of renal origin: Secondary | ICD-10-CM | POA: Diagnosis not present

## 2021-11-04 DIAGNOSIS — I10 Essential (primary) hypertension: Secondary | ICD-10-CM | POA: Diagnosis not present

## 2021-11-05 DIAGNOSIS — I10 Essential (primary) hypertension: Secondary | ICD-10-CM | POA: Diagnosis not present

## 2021-11-06 DIAGNOSIS — I10 Essential (primary) hypertension: Secondary | ICD-10-CM | POA: Diagnosis not present

## 2021-11-07 DIAGNOSIS — I10 Essential (primary) hypertension: Secondary | ICD-10-CM | POA: Diagnosis not present

## 2021-11-08 DIAGNOSIS — N186 End stage renal disease: Secondary | ICD-10-CM | POA: Diagnosis not present

## 2021-11-08 DIAGNOSIS — I10 Essential (primary) hypertension: Secondary | ICD-10-CM | POA: Diagnosis not present

## 2021-11-08 DIAGNOSIS — N2581 Secondary hyperparathyroidism of renal origin: Secondary | ICD-10-CM | POA: Diagnosis not present

## 2021-11-08 DIAGNOSIS — Z992 Dependence on renal dialysis: Secondary | ICD-10-CM | POA: Diagnosis not present

## 2021-11-09 DIAGNOSIS — I10 Essential (primary) hypertension: Secondary | ICD-10-CM | POA: Diagnosis not present

## 2021-11-10 DIAGNOSIS — N186 End stage renal disease: Secondary | ICD-10-CM | POA: Diagnosis not present

## 2021-11-10 DIAGNOSIS — Z992 Dependence on renal dialysis: Secondary | ICD-10-CM | POA: Diagnosis not present

## 2021-11-10 DIAGNOSIS — N2581 Secondary hyperparathyroidism of renal origin: Secondary | ICD-10-CM | POA: Diagnosis not present

## 2021-11-10 DIAGNOSIS — I10 Essential (primary) hypertension: Secondary | ICD-10-CM | POA: Diagnosis not present

## 2021-11-11 DIAGNOSIS — I10 Essential (primary) hypertension: Secondary | ICD-10-CM | POA: Diagnosis not present

## 2021-11-12 DIAGNOSIS — N2581 Secondary hyperparathyroidism of renal origin: Secondary | ICD-10-CM | POA: Diagnosis not present

## 2021-11-12 DIAGNOSIS — Z992 Dependence on renal dialysis: Secondary | ICD-10-CM | POA: Diagnosis not present

## 2021-11-12 DIAGNOSIS — N186 End stage renal disease: Secondary | ICD-10-CM | POA: Diagnosis not present

## 2021-11-12 DIAGNOSIS — I10 Essential (primary) hypertension: Secondary | ICD-10-CM | POA: Diagnosis not present

## 2021-11-13 DIAGNOSIS — I10 Essential (primary) hypertension: Secondary | ICD-10-CM | POA: Diagnosis not present

## 2021-11-14 DIAGNOSIS — I10 Essential (primary) hypertension: Secondary | ICD-10-CM | POA: Diagnosis not present

## 2021-11-15 DIAGNOSIS — N186 End stage renal disease: Secondary | ICD-10-CM | POA: Diagnosis not present

## 2021-11-15 DIAGNOSIS — Z992 Dependence on renal dialysis: Secondary | ICD-10-CM | POA: Diagnosis not present

## 2021-11-15 DIAGNOSIS — I10 Essential (primary) hypertension: Secondary | ICD-10-CM | POA: Diagnosis not present

## 2021-11-15 DIAGNOSIS — N2581 Secondary hyperparathyroidism of renal origin: Secondary | ICD-10-CM | POA: Diagnosis not present

## 2021-11-16 DIAGNOSIS — I10 Essential (primary) hypertension: Secondary | ICD-10-CM | POA: Diagnosis not present

## 2021-11-17 DIAGNOSIS — I10 Essential (primary) hypertension: Secondary | ICD-10-CM | POA: Diagnosis not present

## 2021-11-17 DIAGNOSIS — N186 End stage renal disease: Secondary | ICD-10-CM | POA: Diagnosis not present

## 2021-11-17 DIAGNOSIS — Z992 Dependence on renal dialysis: Secondary | ICD-10-CM | POA: Diagnosis not present

## 2021-11-17 DIAGNOSIS — N2581 Secondary hyperparathyroidism of renal origin: Secondary | ICD-10-CM | POA: Diagnosis not present

## 2021-11-18 DIAGNOSIS — I10 Essential (primary) hypertension: Secondary | ICD-10-CM | POA: Diagnosis not present

## 2021-11-19 DIAGNOSIS — I10 Essential (primary) hypertension: Secondary | ICD-10-CM | POA: Diagnosis not present

## 2021-11-20 DIAGNOSIS — I10 Essential (primary) hypertension: Secondary | ICD-10-CM | POA: Diagnosis not present

## 2021-11-21 DIAGNOSIS — I10 Essential (primary) hypertension: Secondary | ICD-10-CM | POA: Diagnosis not present

## 2021-11-22 DIAGNOSIS — Z992 Dependence on renal dialysis: Secondary | ICD-10-CM | POA: Diagnosis not present

## 2021-11-22 DIAGNOSIS — N2581 Secondary hyperparathyroidism of renal origin: Secondary | ICD-10-CM | POA: Diagnosis not present

## 2021-11-22 DIAGNOSIS — N186 End stage renal disease: Secondary | ICD-10-CM | POA: Diagnosis not present

## 2021-11-22 DIAGNOSIS — Z1159 Encounter for screening for other viral diseases: Secondary | ICD-10-CM | POA: Diagnosis not present

## 2021-11-22 DIAGNOSIS — D509 Iron deficiency anemia, unspecified: Secondary | ICD-10-CM | POA: Diagnosis not present

## 2021-11-24 DIAGNOSIS — N2581 Secondary hyperparathyroidism of renal origin: Secondary | ICD-10-CM | POA: Diagnosis not present

## 2021-11-24 DIAGNOSIS — Z992 Dependence on renal dialysis: Secondary | ICD-10-CM | POA: Diagnosis not present

## 2021-11-24 DIAGNOSIS — N186 End stage renal disease: Secondary | ICD-10-CM | POA: Diagnosis not present

## 2021-11-25 DIAGNOSIS — I10 Essential (primary) hypertension: Secondary | ICD-10-CM | POA: Diagnosis not present

## 2021-11-26 DIAGNOSIS — I10 Essential (primary) hypertension: Secondary | ICD-10-CM | POA: Diagnosis not present

## 2021-11-26 DIAGNOSIS — N186 End stage renal disease: Secondary | ICD-10-CM | POA: Diagnosis not present

## 2021-11-26 DIAGNOSIS — N2581 Secondary hyperparathyroidism of renal origin: Secondary | ICD-10-CM | POA: Diagnosis not present

## 2021-11-26 DIAGNOSIS — Z992 Dependence on renal dialysis: Secondary | ICD-10-CM | POA: Diagnosis not present

## 2021-11-27 DIAGNOSIS — I10 Essential (primary) hypertension: Secondary | ICD-10-CM | POA: Diagnosis not present

## 2021-11-28 DIAGNOSIS — I10 Essential (primary) hypertension: Secondary | ICD-10-CM | POA: Diagnosis not present

## 2021-11-29 DIAGNOSIS — I10 Essential (primary) hypertension: Secondary | ICD-10-CM | POA: Diagnosis not present

## 2021-11-30 DIAGNOSIS — I10 Essential (primary) hypertension: Secondary | ICD-10-CM | POA: Diagnosis not present

## 2021-12-01 DIAGNOSIS — N2581 Secondary hyperparathyroidism of renal origin: Secondary | ICD-10-CM | POA: Diagnosis not present

## 2021-12-01 DIAGNOSIS — I10 Essential (primary) hypertension: Secondary | ICD-10-CM | POA: Diagnosis not present

## 2021-12-01 DIAGNOSIS — N186 End stage renal disease: Secondary | ICD-10-CM | POA: Diagnosis not present

## 2021-12-01 DIAGNOSIS — Z992 Dependence on renal dialysis: Secondary | ICD-10-CM | POA: Diagnosis not present

## 2021-12-02 DIAGNOSIS — I10 Essential (primary) hypertension: Secondary | ICD-10-CM | POA: Diagnosis not present

## 2021-12-03 DIAGNOSIS — N2581 Secondary hyperparathyroidism of renal origin: Secondary | ICD-10-CM | POA: Diagnosis not present

## 2021-12-03 DIAGNOSIS — I10 Essential (primary) hypertension: Secondary | ICD-10-CM | POA: Diagnosis not present

## 2021-12-03 DIAGNOSIS — N186 End stage renal disease: Secondary | ICD-10-CM | POA: Diagnosis not present

## 2021-12-03 DIAGNOSIS — Z992 Dependence on renal dialysis: Secondary | ICD-10-CM | POA: Diagnosis not present

## 2021-12-04 DIAGNOSIS — I10 Essential (primary) hypertension: Secondary | ICD-10-CM | POA: Diagnosis not present

## 2021-12-05 DIAGNOSIS — I10 Essential (primary) hypertension: Secondary | ICD-10-CM | POA: Diagnosis not present

## 2021-12-06 DIAGNOSIS — I10 Essential (primary) hypertension: Secondary | ICD-10-CM | POA: Diagnosis not present

## 2021-12-06 DIAGNOSIS — N2581 Secondary hyperparathyroidism of renal origin: Secondary | ICD-10-CM | POA: Diagnosis not present

## 2021-12-06 DIAGNOSIS — Z992 Dependence on renal dialysis: Secondary | ICD-10-CM | POA: Diagnosis not present

## 2021-12-06 DIAGNOSIS — N186 End stage renal disease: Secondary | ICD-10-CM | POA: Diagnosis not present

## 2021-12-07 DIAGNOSIS — I10 Essential (primary) hypertension: Secondary | ICD-10-CM | POA: Diagnosis not present

## 2021-12-08 DIAGNOSIS — Z992 Dependence on renal dialysis: Secondary | ICD-10-CM | POA: Diagnosis not present

## 2021-12-08 DIAGNOSIS — I10 Essential (primary) hypertension: Secondary | ICD-10-CM | POA: Diagnosis not present

## 2021-12-08 DIAGNOSIS — N186 End stage renal disease: Secondary | ICD-10-CM | POA: Diagnosis not present

## 2021-12-08 DIAGNOSIS — N2581 Secondary hyperparathyroidism of renal origin: Secondary | ICD-10-CM | POA: Diagnosis not present

## 2021-12-09 DIAGNOSIS — I10 Essential (primary) hypertension: Secondary | ICD-10-CM | POA: Diagnosis not present

## 2021-12-10 DIAGNOSIS — I10 Essential (primary) hypertension: Secondary | ICD-10-CM | POA: Diagnosis not present

## 2021-12-10 DIAGNOSIS — N186 End stage renal disease: Secondary | ICD-10-CM | POA: Diagnosis not present

## 2021-12-10 DIAGNOSIS — Z992 Dependence on renal dialysis: Secondary | ICD-10-CM | POA: Diagnosis not present

## 2021-12-10 DIAGNOSIS — N2581 Secondary hyperparathyroidism of renal origin: Secondary | ICD-10-CM | POA: Diagnosis not present

## 2021-12-11 DIAGNOSIS — I10 Essential (primary) hypertension: Secondary | ICD-10-CM | POA: Diagnosis not present

## 2021-12-12 DIAGNOSIS — I10 Essential (primary) hypertension: Secondary | ICD-10-CM | POA: Diagnosis not present

## 2021-12-13 DIAGNOSIS — N2581 Secondary hyperparathyroidism of renal origin: Secondary | ICD-10-CM | POA: Diagnosis not present

## 2021-12-13 DIAGNOSIS — Z992 Dependence on renal dialysis: Secondary | ICD-10-CM | POA: Diagnosis not present

## 2021-12-13 DIAGNOSIS — I10 Essential (primary) hypertension: Secondary | ICD-10-CM | POA: Diagnosis not present

## 2021-12-13 DIAGNOSIS — N186 End stage renal disease: Secondary | ICD-10-CM | POA: Diagnosis not present

## 2021-12-14 DIAGNOSIS — I10 Essential (primary) hypertension: Secondary | ICD-10-CM | POA: Diagnosis not present

## 2021-12-15 DIAGNOSIS — I10 Essential (primary) hypertension: Secondary | ICD-10-CM | POA: Diagnosis not present

## 2021-12-15 DIAGNOSIS — N2581 Secondary hyperparathyroidism of renal origin: Secondary | ICD-10-CM | POA: Diagnosis not present

## 2021-12-15 DIAGNOSIS — Z992 Dependence on renal dialysis: Secondary | ICD-10-CM | POA: Diagnosis not present

## 2021-12-15 DIAGNOSIS — N186 End stage renal disease: Secondary | ICD-10-CM | POA: Diagnosis not present

## 2021-12-15 DIAGNOSIS — D509 Iron deficiency anemia, unspecified: Secondary | ICD-10-CM | POA: Diagnosis not present

## 2021-12-15 DIAGNOSIS — Z1159 Encounter for screening for other viral diseases: Secondary | ICD-10-CM | POA: Diagnosis not present

## 2021-12-16 DIAGNOSIS — I10 Essential (primary) hypertension: Secondary | ICD-10-CM | POA: Diagnosis not present

## 2021-12-17 DIAGNOSIS — I10 Essential (primary) hypertension: Secondary | ICD-10-CM | POA: Diagnosis not present

## 2021-12-18 DIAGNOSIS — I10 Essential (primary) hypertension: Secondary | ICD-10-CM | POA: Diagnosis not present

## 2021-12-19 DIAGNOSIS — I10 Essential (primary) hypertension: Secondary | ICD-10-CM | POA: Diagnosis not present

## 2021-12-20 DIAGNOSIS — I10 Essential (primary) hypertension: Secondary | ICD-10-CM | POA: Diagnosis not present

## 2021-12-20 DIAGNOSIS — N186 End stage renal disease: Secondary | ICD-10-CM | POA: Diagnosis not present

## 2021-12-20 DIAGNOSIS — Z992 Dependence on renal dialysis: Secondary | ICD-10-CM | POA: Diagnosis not present

## 2021-12-20 DIAGNOSIS — N2581 Secondary hyperparathyroidism of renal origin: Secondary | ICD-10-CM | POA: Diagnosis not present

## 2021-12-21 DIAGNOSIS — I10 Essential (primary) hypertension: Secondary | ICD-10-CM | POA: Diagnosis not present

## 2021-12-22 DIAGNOSIS — K219 Gastro-esophageal reflux disease without esophagitis: Secondary | ICD-10-CM | POA: Diagnosis not present

## 2021-12-22 DIAGNOSIS — N2581 Secondary hyperparathyroidism of renal origin: Secondary | ICD-10-CM | POA: Diagnosis not present

## 2021-12-22 DIAGNOSIS — I739 Peripheral vascular disease, unspecified: Secondary | ICD-10-CM | POA: Diagnosis not present

## 2021-12-22 DIAGNOSIS — Z992 Dependence on renal dialysis: Secondary | ICD-10-CM | POA: Diagnosis not present

## 2021-12-22 DIAGNOSIS — N186 End stage renal disease: Secondary | ICD-10-CM | POA: Diagnosis not present

## 2021-12-22 DIAGNOSIS — I10 Essential (primary) hypertension: Secondary | ICD-10-CM | POA: Diagnosis not present

## 2021-12-24 DIAGNOSIS — N2581 Secondary hyperparathyroidism of renal origin: Secondary | ICD-10-CM | POA: Diagnosis not present

## 2021-12-24 DIAGNOSIS — Z992 Dependence on renal dialysis: Secondary | ICD-10-CM | POA: Diagnosis not present

## 2021-12-24 DIAGNOSIS — N186 End stage renal disease: Secondary | ICD-10-CM | POA: Diagnosis not present

## 2021-12-25 DIAGNOSIS — Z992 Dependence on renal dialysis: Secondary | ICD-10-CM | POA: Diagnosis not present

## 2021-12-25 DIAGNOSIS — N186 End stage renal disease: Secondary | ICD-10-CM | POA: Diagnosis not present

## 2021-12-26 DIAGNOSIS — I10 Essential (primary) hypertension: Secondary | ICD-10-CM | POA: Diagnosis not present

## 2021-12-27 DIAGNOSIS — Z992 Dependence on renal dialysis: Secondary | ICD-10-CM | POA: Diagnosis not present

## 2021-12-27 DIAGNOSIS — N186 End stage renal disease: Secondary | ICD-10-CM | POA: Diagnosis not present

## 2021-12-27 DIAGNOSIS — I10 Essential (primary) hypertension: Secondary | ICD-10-CM | POA: Diagnosis not present

## 2021-12-27 DIAGNOSIS — N2581 Secondary hyperparathyroidism of renal origin: Secondary | ICD-10-CM | POA: Diagnosis not present

## 2021-12-28 DIAGNOSIS — I10 Essential (primary) hypertension: Secondary | ICD-10-CM | POA: Diagnosis not present

## 2021-12-29 DIAGNOSIS — I10 Essential (primary) hypertension: Secondary | ICD-10-CM | POA: Diagnosis not present

## 2021-12-29 DIAGNOSIS — N2581 Secondary hyperparathyroidism of renal origin: Secondary | ICD-10-CM | POA: Diagnosis not present

## 2021-12-29 DIAGNOSIS — Z992 Dependence on renal dialysis: Secondary | ICD-10-CM | POA: Diagnosis not present

## 2021-12-29 DIAGNOSIS — N186 End stage renal disease: Secondary | ICD-10-CM | POA: Diagnosis not present

## 2021-12-30 DIAGNOSIS — I10 Essential (primary) hypertension: Secondary | ICD-10-CM | POA: Diagnosis not present

## 2021-12-31 DIAGNOSIS — I10 Essential (primary) hypertension: Secondary | ICD-10-CM | POA: Diagnosis not present

## 2021-12-31 DIAGNOSIS — N186 End stage renal disease: Secondary | ICD-10-CM | POA: Diagnosis not present

## 2021-12-31 DIAGNOSIS — Z992 Dependence on renal dialysis: Secondary | ICD-10-CM | POA: Diagnosis not present

## 2021-12-31 DIAGNOSIS — N2581 Secondary hyperparathyroidism of renal origin: Secondary | ICD-10-CM | POA: Diagnosis not present

## 2022-01-01 DIAGNOSIS — I10 Essential (primary) hypertension: Secondary | ICD-10-CM | POA: Diagnosis not present

## 2022-01-02 DIAGNOSIS — I10 Essential (primary) hypertension: Secondary | ICD-10-CM | POA: Diagnosis not present

## 2022-01-03 DIAGNOSIS — Z992 Dependence on renal dialysis: Secondary | ICD-10-CM | POA: Diagnosis not present

## 2022-01-03 DIAGNOSIS — N2581 Secondary hyperparathyroidism of renal origin: Secondary | ICD-10-CM | POA: Diagnosis not present

## 2022-01-03 DIAGNOSIS — I10 Essential (primary) hypertension: Secondary | ICD-10-CM | POA: Diagnosis not present

## 2022-01-03 DIAGNOSIS — N186 End stage renal disease: Secondary | ICD-10-CM | POA: Diagnosis not present

## 2022-01-04 DIAGNOSIS — I10 Essential (primary) hypertension: Secondary | ICD-10-CM | POA: Diagnosis not present

## 2022-01-05 DIAGNOSIS — Z992 Dependence on renal dialysis: Secondary | ICD-10-CM | POA: Diagnosis not present

## 2022-01-05 DIAGNOSIS — N186 End stage renal disease: Secondary | ICD-10-CM | POA: Diagnosis not present

## 2022-01-05 DIAGNOSIS — I10 Essential (primary) hypertension: Secondary | ICD-10-CM | POA: Diagnosis not present

## 2022-01-05 DIAGNOSIS — N2581 Secondary hyperparathyroidism of renal origin: Secondary | ICD-10-CM | POA: Diagnosis not present

## 2022-01-06 DIAGNOSIS — I10 Essential (primary) hypertension: Secondary | ICD-10-CM | POA: Diagnosis not present

## 2022-01-07 DIAGNOSIS — N2581 Secondary hyperparathyroidism of renal origin: Secondary | ICD-10-CM | POA: Diagnosis not present

## 2022-01-07 DIAGNOSIS — I10 Essential (primary) hypertension: Secondary | ICD-10-CM | POA: Diagnosis not present

## 2022-01-07 DIAGNOSIS — Z992 Dependence on renal dialysis: Secondary | ICD-10-CM | POA: Diagnosis not present

## 2022-01-07 DIAGNOSIS — N186 End stage renal disease: Secondary | ICD-10-CM | POA: Diagnosis not present

## 2022-01-08 DIAGNOSIS — I10 Essential (primary) hypertension: Secondary | ICD-10-CM | POA: Diagnosis not present

## 2022-01-09 DIAGNOSIS — I10 Essential (primary) hypertension: Secondary | ICD-10-CM | POA: Diagnosis not present

## 2022-01-10 DIAGNOSIS — N2581 Secondary hyperparathyroidism of renal origin: Secondary | ICD-10-CM | POA: Diagnosis not present

## 2022-01-10 DIAGNOSIS — I10 Essential (primary) hypertension: Secondary | ICD-10-CM | POA: Diagnosis not present

## 2022-01-10 DIAGNOSIS — N186 End stage renal disease: Secondary | ICD-10-CM | POA: Diagnosis not present

## 2022-01-10 DIAGNOSIS — Z992 Dependence on renal dialysis: Secondary | ICD-10-CM | POA: Diagnosis not present

## 2022-01-11 DIAGNOSIS — I10 Essential (primary) hypertension: Secondary | ICD-10-CM | POA: Diagnosis not present

## 2022-01-12 DIAGNOSIS — Z992 Dependence on renal dialysis: Secondary | ICD-10-CM | POA: Diagnosis not present

## 2022-01-12 DIAGNOSIS — I10 Essential (primary) hypertension: Secondary | ICD-10-CM | POA: Diagnosis not present

## 2022-01-12 DIAGNOSIS — N2581 Secondary hyperparathyroidism of renal origin: Secondary | ICD-10-CM | POA: Diagnosis not present

## 2022-01-12 DIAGNOSIS — N186 End stage renal disease: Secondary | ICD-10-CM | POA: Diagnosis not present

## 2022-01-13 DIAGNOSIS — I10 Essential (primary) hypertension: Secondary | ICD-10-CM | POA: Diagnosis not present

## 2022-01-14 DIAGNOSIS — N186 End stage renal disease: Secondary | ICD-10-CM | POA: Diagnosis not present

## 2022-01-14 DIAGNOSIS — N2581 Secondary hyperparathyroidism of renal origin: Secondary | ICD-10-CM | POA: Diagnosis not present

## 2022-01-14 DIAGNOSIS — I10 Essential (primary) hypertension: Secondary | ICD-10-CM | POA: Diagnosis not present

## 2022-01-14 DIAGNOSIS — Z992 Dependence on renal dialysis: Secondary | ICD-10-CM | POA: Diagnosis not present

## 2022-01-19 DIAGNOSIS — I10 Essential (primary) hypertension: Secondary | ICD-10-CM | POA: Diagnosis not present

## 2022-01-19 DIAGNOSIS — R69 Illness, unspecified: Secondary | ICD-10-CM | POA: Diagnosis not present

## 2022-01-19 DIAGNOSIS — Z1159 Encounter for screening for other viral diseases: Secondary | ICD-10-CM | POA: Diagnosis not present

## 2022-01-19 DIAGNOSIS — Z992 Dependence on renal dialysis: Secondary | ICD-10-CM | POA: Diagnosis not present

## 2022-01-19 DIAGNOSIS — F17290 Nicotine dependence, other tobacco product, uncomplicated: Secondary | ICD-10-CM | POA: Diagnosis not present

## 2022-01-19 DIAGNOSIS — H9202 Otalgia, left ear: Secondary | ICD-10-CM | POA: Diagnosis not present

## 2022-01-19 DIAGNOSIS — N2581 Secondary hyperparathyroidism of renal origin: Secondary | ICD-10-CM | POA: Diagnosis not present

## 2022-01-19 DIAGNOSIS — D509 Iron deficiency anemia, unspecified: Secondary | ICD-10-CM | POA: Diagnosis not present

## 2022-01-19 DIAGNOSIS — N186 End stage renal disease: Secondary | ICD-10-CM | POA: Diagnosis not present

## 2022-01-21 DIAGNOSIS — N186 End stage renal disease: Secondary | ICD-10-CM | POA: Diagnosis not present

## 2022-01-21 DIAGNOSIS — Z992 Dependence on renal dialysis: Secondary | ICD-10-CM | POA: Diagnosis not present

## 2022-01-21 DIAGNOSIS — N2581 Secondary hyperparathyroidism of renal origin: Secondary | ICD-10-CM | POA: Diagnosis not present

## 2022-01-22 DIAGNOSIS — I10 Essential (primary) hypertension: Secondary | ICD-10-CM | POA: Diagnosis not present

## 2022-01-23 DIAGNOSIS — I10 Essential (primary) hypertension: Secondary | ICD-10-CM | POA: Diagnosis not present

## 2022-01-24 DIAGNOSIS — I10 Essential (primary) hypertension: Secondary | ICD-10-CM | POA: Diagnosis not present

## 2022-01-24 DIAGNOSIS — Z992 Dependence on renal dialysis: Secondary | ICD-10-CM | POA: Diagnosis not present

## 2022-01-24 DIAGNOSIS — N186 End stage renal disease: Secondary | ICD-10-CM | POA: Diagnosis not present

## 2022-01-24 DIAGNOSIS — N2581 Secondary hyperparathyroidism of renal origin: Secondary | ICD-10-CM | POA: Diagnosis not present

## 2022-01-25 DIAGNOSIS — N186 End stage renal disease: Secondary | ICD-10-CM | POA: Diagnosis not present

## 2022-01-25 DIAGNOSIS — I10 Essential (primary) hypertension: Secondary | ICD-10-CM | POA: Diagnosis not present

## 2022-01-25 DIAGNOSIS — Z992 Dependence on renal dialysis: Secondary | ICD-10-CM | POA: Diagnosis not present

## 2022-01-26 DIAGNOSIS — I10 Essential (primary) hypertension: Secondary | ICD-10-CM | POA: Diagnosis not present

## 2022-01-26 DIAGNOSIS — N2581 Secondary hyperparathyroidism of renal origin: Secondary | ICD-10-CM | POA: Diagnosis not present

## 2022-01-26 DIAGNOSIS — Z992 Dependence on renal dialysis: Secondary | ICD-10-CM | POA: Diagnosis not present

## 2022-01-26 DIAGNOSIS — N186 End stage renal disease: Secondary | ICD-10-CM | POA: Diagnosis not present

## 2022-01-27 DIAGNOSIS — I10 Essential (primary) hypertension: Secondary | ICD-10-CM | POA: Diagnosis not present

## 2022-01-28 DIAGNOSIS — I10 Essential (primary) hypertension: Secondary | ICD-10-CM | POA: Diagnosis not present

## 2022-01-29 DIAGNOSIS — I10 Essential (primary) hypertension: Secondary | ICD-10-CM | POA: Diagnosis not present

## 2022-01-30 DIAGNOSIS — I10 Essential (primary) hypertension: Secondary | ICD-10-CM | POA: Diagnosis not present

## 2022-01-31 DIAGNOSIS — N2581 Secondary hyperparathyroidism of renal origin: Secondary | ICD-10-CM | POA: Diagnosis not present

## 2022-01-31 DIAGNOSIS — I10 Essential (primary) hypertension: Secondary | ICD-10-CM | POA: Diagnosis not present

## 2022-01-31 DIAGNOSIS — N186 End stage renal disease: Secondary | ICD-10-CM | POA: Diagnosis not present

## 2022-01-31 DIAGNOSIS — Z992 Dependence on renal dialysis: Secondary | ICD-10-CM | POA: Diagnosis not present

## 2022-02-01 DIAGNOSIS — I10 Essential (primary) hypertension: Secondary | ICD-10-CM | POA: Diagnosis not present

## 2022-02-02 DIAGNOSIS — Z992 Dependence on renal dialysis: Secondary | ICD-10-CM | POA: Diagnosis not present

## 2022-02-02 DIAGNOSIS — I10 Essential (primary) hypertension: Secondary | ICD-10-CM | POA: Diagnosis not present

## 2022-02-02 DIAGNOSIS — N2581 Secondary hyperparathyroidism of renal origin: Secondary | ICD-10-CM | POA: Diagnosis not present

## 2022-02-02 DIAGNOSIS — N186 End stage renal disease: Secondary | ICD-10-CM | POA: Diagnosis not present

## 2022-02-03 DIAGNOSIS — I10 Essential (primary) hypertension: Secondary | ICD-10-CM | POA: Diagnosis not present

## 2022-02-04 DIAGNOSIS — I10 Essential (primary) hypertension: Secondary | ICD-10-CM | POA: Diagnosis not present

## 2022-02-04 DIAGNOSIS — N186 End stage renal disease: Secondary | ICD-10-CM | POA: Diagnosis not present

## 2022-02-04 DIAGNOSIS — N2581 Secondary hyperparathyroidism of renal origin: Secondary | ICD-10-CM | POA: Diagnosis not present

## 2022-02-04 DIAGNOSIS — Z992 Dependence on renal dialysis: Secondary | ICD-10-CM | POA: Diagnosis not present

## 2022-02-05 DIAGNOSIS — I10 Essential (primary) hypertension: Secondary | ICD-10-CM | POA: Diagnosis not present

## 2022-02-06 DIAGNOSIS — I10 Essential (primary) hypertension: Secondary | ICD-10-CM | POA: Diagnosis not present

## 2022-02-07 DIAGNOSIS — N186 End stage renal disease: Secondary | ICD-10-CM | POA: Diagnosis not present

## 2022-02-07 DIAGNOSIS — Z992 Dependence on renal dialysis: Secondary | ICD-10-CM | POA: Diagnosis not present

## 2022-02-07 DIAGNOSIS — N2581 Secondary hyperparathyroidism of renal origin: Secondary | ICD-10-CM | POA: Diagnosis not present

## 2022-02-07 DIAGNOSIS — I10 Essential (primary) hypertension: Secondary | ICD-10-CM | POA: Diagnosis not present

## 2022-02-08 DIAGNOSIS — I10 Essential (primary) hypertension: Secondary | ICD-10-CM | POA: Diagnosis not present

## 2022-02-09 DIAGNOSIS — Z992 Dependence on renal dialysis: Secondary | ICD-10-CM | POA: Diagnosis not present

## 2022-02-09 DIAGNOSIS — N186 End stage renal disease: Secondary | ICD-10-CM | POA: Diagnosis not present

## 2022-02-09 DIAGNOSIS — I10 Essential (primary) hypertension: Secondary | ICD-10-CM | POA: Diagnosis not present

## 2022-02-09 DIAGNOSIS — N2581 Secondary hyperparathyroidism of renal origin: Secondary | ICD-10-CM | POA: Diagnosis not present

## 2022-02-10 DIAGNOSIS — I10 Essential (primary) hypertension: Secondary | ICD-10-CM | POA: Diagnosis not present

## 2022-02-10 DIAGNOSIS — Z9889 Other specified postprocedural states: Secondary | ICD-10-CM | POA: Diagnosis not present

## 2022-02-10 DIAGNOSIS — N611 Abscess of the breast and nipple: Secondary | ICD-10-CM | POA: Diagnosis not present

## 2022-02-10 DIAGNOSIS — Z992 Dependence on renal dialysis: Secondary | ICD-10-CM | POA: Diagnosis not present

## 2022-02-10 DIAGNOSIS — N186 End stage renal disease: Secondary | ICD-10-CM | POA: Diagnosis not present

## 2022-02-11 DIAGNOSIS — I10 Essential (primary) hypertension: Secondary | ICD-10-CM | POA: Diagnosis not present

## 2022-02-11 DIAGNOSIS — N186 End stage renal disease: Secondary | ICD-10-CM | POA: Diagnosis not present

## 2022-02-11 DIAGNOSIS — N2581 Secondary hyperparathyroidism of renal origin: Secondary | ICD-10-CM | POA: Diagnosis not present

## 2022-02-11 DIAGNOSIS — Z992 Dependence on renal dialysis: Secondary | ICD-10-CM | POA: Diagnosis not present

## 2022-02-12 DIAGNOSIS — I10 Essential (primary) hypertension: Secondary | ICD-10-CM | POA: Diagnosis not present

## 2022-02-13 DIAGNOSIS — I10 Essential (primary) hypertension: Secondary | ICD-10-CM | POA: Diagnosis not present

## 2022-02-14 DIAGNOSIS — I10 Essential (primary) hypertension: Secondary | ICD-10-CM | POA: Diagnosis not present

## 2022-02-15 DIAGNOSIS — N611 Abscess of the breast and nipple: Secondary | ICD-10-CM | POA: Diagnosis not present

## 2022-02-15 DIAGNOSIS — I10 Essential (primary) hypertension: Secondary | ICD-10-CM | POA: Diagnosis not present

## 2022-02-15 DIAGNOSIS — Z9889 Other specified postprocedural states: Secondary | ICD-10-CM | POA: Diagnosis not present

## 2022-02-16 DIAGNOSIS — R69 Illness, unspecified: Secondary | ICD-10-CM | POA: Diagnosis not present

## 2022-02-16 DIAGNOSIS — N186 End stage renal disease: Secondary | ICD-10-CM | POA: Diagnosis not present

## 2022-02-16 DIAGNOSIS — K219 Gastro-esophageal reflux disease without esophagitis: Secondary | ICD-10-CM | POA: Diagnosis not present

## 2022-02-16 DIAGNOSIS — I10 Essential (primary) hypertension: Secondary | ICD-10-CM | POA: Diagnosis not present

## 2022-02-16 DIAGNOSIS — Z79899 Other long term (current) drug therapy: Secondary | ICD-10-CM | POA: Diagnosis not present

## 2022-02-16 DIAGNOSIS — Z992 Dependence on renal dialysis: Secondary | ICD-10-CM | POA: Diagnosis not present

## 2022-02-16 DIAGNOSIS — N2581 Secondary hyperparathyroidism of renal origin: Secondary | ICD-10-CM | POA: Diagnosis not present

## 2022-02-16 DIAGNOSIS — I739 Peripheral vascular disease, unspecified: Secondary | ICD-10-CM | POA: Diagnosis not present

## 2022-02-17 DIAGNOSIS — I10 Essential (primary) hypertension: Secondary | ICD-10-CM | POA: Diagnosis not present

## 2022-02-18 DIAGNOSIS — N186 End stage renal disease: Secondary | ICD-10-CM | POA: Diagnosis not present

## 2022-02-18 DIAGNOSIS — I10 Essential (primary) hypertension: Secondary | ICD-10-CM | POA: Diagnosis not present

## 2022-02-18 DIAGNOSIS — Z992 Dependence on renal dialysis: Secondary | ICD-10-CM | POA: Diagnosis not present

## 2022-02-18 DIAGNOSIS — N2581 Secondary hyperparathyroidism of renal origin: Secondary | ICD-10-CM | POA: Diagnosis not present

## 2022-02-19 DIAGNOSIS — I10 Essential (primary) hypertension: Secondary | ICD-10-CM | POA: Diagnosis not present

## 2022-02-20 DIAGNOSIS — I10 Essential (primary) hypertension: Secondary | ICD-10-CM | POA: Diagnosis not present

## 2022-02-21 DIAGNOSIS — N186 End stage renal disease: Secondary | ICD-10-CM | POA: Diagnosis not present

## 2022-02-21 DIAGNOSIS — N2581 Secondary hyperparathyroidism of renal origin: Secondary | ICD-10-CM | POA: Diagnosis not present

## 2022-02-21 DIAGNOSIS — I10 Essential (primary) hypertension: Secondary | ICD-10-CM | POA: Diagnosis not present

## 2022-02-21 DIAGNOSIS — Z992 Dependence on renal dialysis: Secondary | ICD-10-CM | POA: Diagnosis not present

## 2022-02-21 DIAGNOSIS — Z1159 Encounter for screening for other viral diseases: Secondary | ICD-10-CM | POA: Diagnosis not present

## 2022-02-22 DIAGNOSIS — Z992 Dependence on renal dialysis: Secondary | ICD-10-CM | POA: Diagnosis not present

## 2022-02-22 DIAGNOSIS — N186 End stage renal disease: Secondary | ICD-10-CM | POA: Diagnosis not present

## 2022-02-23 DIAGNOSIS — N186 End stage renal disease: Secondary | ICD-10-CM | POA: Diagnosis not present

## 2022-02-23 DIAGNOSIS — N2581 Secondary hyperparathyroidism of renal origin: Secondary | ICD-10-CM | POA: Diagnosis not present

## 2022-02-23 DIAGNOSIS — I120 Hypertensive chronic kidney disease with stage 5 chronic kidney disease or end stage renal disease: Secondary | ICD-10-CM | POA: Diagnosis not present

## 2022-02-23 DIAGNOSIS — Z992 Dependence on renal dialysis: Secondary | ICD-10-CM | POA: Diagnosis not present

## 2022-02-24 DIAGNOSIS — I120 Hypertensive chronic kidney disease with stage 5 chronic kidney disease or end stage renal disease: Secondary | ICD-10-CM | POA: Diagnosis not present

## 2022-02-25 DIAGNOSIS — N186 End stage renal disease: Secondary | ICD-10-CM | POA: Diagnosis not present

## 2022-02-25 DIAGNOSIS — I120 Hypertensive chronic kidney disease with stage 5 chronic kidney disease or end stage renal disease: Secondary | ICD-10-CM | POA: Diagnosis not present

## 2022-02-25 DIAGNOSIS — Z992 Dependence on renal dialysis: Secondary | ICD-10-CM | POA: Diagnosis not present

## 2022-02-25 DIAGNOSIS — N2581 Secondary hyperparathyroidism of renal origin: Secondary | ICD-10-CM | POA: Diagnosis not present

## 2022-02-26 DIAGNOSIS — I120 Hypertensive chronic kidney disease with stage 5 chronic kidney disease or end stage renal disease: Secondary | ICD-10-CM | POA: Diagnosis not present

## 2022-02-27 DIAGNOSIS — I120 Hypertensive chronic kidney disease with stage 5 chronic kidney disease or end stage renal disease: Secondary | ICD-10-CM | POA: Diagnosis not present

## 2022-02-28 DIAGNOSIS — I120 Hypertensive chronic kidney disease with stage 5 chronic kidney disease or end stage renal disease: Secondary | ICD-10-CM | POA: Diagnosis not present

## 2022-02-28 DIAGNOSIS — Z992 Dependence on renal dialysis: Secondary | ICD-10-CM | POA: Diagnosis not present

## 2022-02-28 DIAGNOSIS — N2581 Secondary hyperparathyroidism of renal origin: Secondary | ICD-10-CM | POA: Diagnosis not present

## 2022-02-28 DIAGNOSIS — N186 End stage renal disease: Secondary | ICD-10-CM | POA: Diagnosis not present

## 2022-03-01 DIAGNOSIS — I120 Hypertensive chronic kidney disease with stage 5 chronic kidney disease or end stage renal disease: Secondary | ICD-10-CM | POA: Diagnosis not present

## 2022-03-02 DIAGNOSIS — Z992 Dependence on renal dialysis: Secondary | ICD-10-CM | POA: Diagnosis not present

## 2022-03-02 DIAGNOSIS — N2581 Secondary hyperparathyroidism of renal origin: Secondary | ICD-10-CM | POA: Diagnosis not present

## 2022-03-02 DIAGNOSIS — N186 End stage renal disease: Secondary | ICD-10-CM | POA: Diagnosis not present

## 2022-03-02 DIAGNOSIS — I120 Hypertensive chronic kidney disease with stage 5 chronic kidney disease or end stage renal disease: Secondary | ICD-10-CM | POA: Diagnosis not present

## 2022-03-03 DIAGNOSIS — I120 Hypertensive chronic kidney disease with stage 5 chronic kidney disease or end stage renal disease: Secondary | ICD-10-CM | POA: Diagnosis not present

## 2022-03-04 DIAGNOSIS — N186 End stage renal disease: Secondary | ICD-10-CM | POA: Diagnosis not present

## 2022-03-04 DIAGNOSIS — I120 Hypertensive chronic kidney disease with stage 5 chronic kidney disease or end stage renal disease: Secondary | ICD-10-CM | POA: Diagnosis not present

## 2022-03-04 DIAGNOSIS — Z992 Dependence on renal dialysis: Secondary | ICD-10-CM | POA: Diagnosis not present

## 2022-03-04 DIAGNOSIS — N2581 Secondary hyperparathyroidism of renal origin: Secondary | ICD-10-CM | POA: Diagnosis not present

## 2022-03-05 DIAGNOSIS — I120 Hypertensive chronic kidney disease with stage 5 chronic kidney disease or end stage renal disease: Secondary | ICD-10-CM | POA: Diagnosis not present

## 2022-03-06 DIAGNOSIS — I120 Hypertensive chronic kidney disease with stage 5 chronic kidney disease or end stage renal disease: Secondary | ICD-10-CM | POA: Diagnosis not present

## 2022-03-07 DIAGNOSIS — I120 Hypertensive chronic kidney disease with stage 5 chronic kidney disease or end stage renal disease: Secondary | ICD-10-CM | POA: Diagnosis not present

## 2022-03-08 DIAGNOSIS — I120 Hypertensive chronic kidney disease with stage 5 chronic kidney disease or end stage renal disease: Secondary | ICD-10-CM | POA: Diagnosis not present

## 2022-03-09 DIAGNOSIS — N186 End stage renal disease: Secondary | ICD-10-CM | POA: Diagnosis not present

## 2022-03-09 DIAGNOSIS — Z992 Dependence on renal dialysis: Secondary | ICD-10-CM | POA: Diagnosis not present

## 2022-03-09 DIAGNOSIS — I120 Hypertensive chronic kidney disease with stage 5 chronic kidney disease or end stage renal disease: Secondary | ICD-10-CM | POA: Diagnosis not present

## 2022-03-09 DIAGNOSIS — N2581 Secondary hyperparathyroidism of renal origin: Secondary | ICD-10-CM | POA: Diagnosis not present

## 2022-03-10 DIAGNOSIS — I120 Hypertensive chronic kidney disease with stage 5 chronic kidney disease or end stage renal disease: Secondary | ICD-10-CM | POA: Diagnosis not present

## 2022-03-11 DIAGNOSIS — I120 Hypertensive chronic kidney disease with stage 5 chronic kidney disease or end stage renal disease: Secondary | ICD-10-CM | POA: Diagnosis not present

## 2022-03-11 DIAGNOSIS — N186 End stage renal disease: Secondary | ICD-10-CM | POA: Diagnosis not present

## 2022-03-11 DIAGNOSIS — N2581 Secondary hyperparathyroidism of renal origin: Secondary | ICD-10-CM | POA: Diagnosis not present

## 2022-03-11 DIAGNOSIS — Z992 Dependence on renal dialysis: Secondary | ICD-10-CM | POA: Diagnosis not present

## 2022-03-12 DIAGNOSIS — I120 Hypertensive chronic kidney disease with stage 5 chronic kidney disease or end stage renal disease: Secondary | ICD-10-CM | POA: Diagnosis not present

## 2022-03-13 DIAGNOSIS — I120 Hypertensive chronic kidney disease with stage 5 chronic kidney disease or end stage renal disease: Secondary | ICD-10-CM | POA: Diagnosis not present

## 2022-03-14 DIAGNOSIS — N2581 Secondary hyperparathyroidism of renal origin: Secondary | ICD-10-CM | POA: Diagnosis not present

## 2022-03-14 DIAGNOSIS — I120 Hypertensive chronic kidney disease with stage 5 chronic kidney disease or end stage renal disease: Secondary | ICD-10-CM | POA: Diagnosis not present

## 2022-03-14 DIAGNOSIS — Z992 Dependence on renal dialysis: Secondary | ICD-10-CM | POA: Diagnosis not present

## 2022-03-14 DIAGNOSIS — N186 End stage renal disease: Secondary | ICD-10-CM | POA: Diagnosis not present

## 2022-03-15 DIAGNOSIS — I120 Hypertensive chronic kidney disease with stage 5 chronic kidney disease or end stage renal disease: Secondary | ICD-10-CM | POA: Diagnosis not present

## 2022-03-16 DIAGNOSIS — K219 Gastro-esophageal reflux disease without esophagitis: Secondary | ICD-10-CM | POA: Diagnosis not present

## 2022-03-16 DIAGNOSIS — I739 Peripheral vascular disease, unspecified: Secondary | ICD-10-CM | POA: Diagnosis not present

## 2022-03-16 DIAGNOSIS — I10 Essential (primary) hypertension: Secondary | ICD-10-CM | POA: Diagnosis not present

## 2022-03-16 DIAGNOSIS — R69 Illness, unspecified: Secondary | ICD-10-CM | POA: Diagnosis not present

## 2022-03-16 DIAGNOSIS — I120 Hypertensive chronic kidney disease with stage 5 chronic kidney disease or end stage renal disease: Secondary | ICD-10-CM | POA: Diagnosis not present

## 2022-03-16 DIAGNOSIS — N2581 Secondary hyperparathyroidism of renal origin: Secondary | ICD-10-CM | POA: Diagnosis not present

## 2022-03-16 DIAGNOSIS — Z992 Dependence on renal dialysis: Secondary | ICD-10-CM | POA: Diagnosis not present

## 2022-03-16 DIAGNOSIS — N186 End stage renal disease: Secondary | ICD-10-CM | POA: Diagnosis not present

## 2022-03-16 DIAGNOSIS — Z79899 Other long term (current) drug therapy: Secondary | ICD-10-CM | POA: Diagnosis not present

## 2022-03-17 DIAGNOSIS — I120 Hypertensive chronic kidney disease with stage 5 chronic kidney disease or end stage renal disease: Secondary | ICD-10-CM | POA: Diagnosis not present

## 2022-03-18 DIAGNOSIS — Z992 Dependence on renal dialysis: Secondary | ICD-10-CM | POA: Diagnosis not present

## 2022-03-18 DIAGNOSIS — N2581 Secondary hyperparathyroidism of renal origin: Secondary | ICD-10-CM | POA: Diagnosis not present

## 2022-03-18 DIAGNOSIS — I120 Hypertensive chronic kidney disease with stage 5 chronic kidney disease or end stage renal disease: Secondary | ICD-10-CM | POA: Diagnosis not present

## 2022-03-18 DIAGNOSIS — N186 End stage renal disease: Secondary | ICD-10-CM | POA: Diagnosis not present

## 2022-03-19 DIAGNOSIS — I120 Hypertensive chronic kidney disease with stage 5 chronic kidney disease or end stage renal disease: Secondary | ICD-10-CM | POA: Diagnosis not present

## 2022-03-20 DIAGNOSIS — I120 Hypertensive chronic kidney disease with stage 5 chronic kidney disease or end stage renal disease: Secondary | ICD-10-CM | POA: Diagnosis not present

## 2022-03-21 DIAGNOSIS — I120 Hypertensive chronic kidney disease with stage 5 chronic kidney disease or end stage renal disease: Secondary | ICD-10-CM | POA: Diagnosis not present

## 2022-03-21 DIAGNOSIS — N2581 Secondary hyperparathyroidism of renal origin: Secondary | ICD-10-CM | POA: Diagnosis not present

## 2022-03-21 DIAGNOSIS — Z992 Dependence on renal dialysis: Secondary | ICD-10-CM | POA: Diagnosis not present

## 2022-03-21 DIAGNOSIS — D509 Iron deficiency anemia, unspecified: Secondary | ICD-10-CM | POA: Diagnosis not present

## 2022-03-21 DIAGNOSIS — Z1159 Encounter for screening for other viral diseases: Secondary | ICD-10-CM | POA: Diagnosis not present

## 2022-03-21 DIAGNOSIS — E785 Hyperlipidemia, unspecified: Secondary | ICD-10-CM | POA: Diagnosis not present

## 2022-03-21 DIAGNOSIS — N186 End stage renal disease: Secondary | ICD-10-CM | POA: Diagnosis not present

## 2022-03-23 DIAGNOSIS — N186 End stage renal disease: Secondary | ICD-10-CM | POA: Diagnosis not present

## 2022-03-23 DIAGNOSIS — Z992 Dependence on renal dialysis: Secondary | ICD-10-CM | POA: Diagnosis not present

## 2022-03-23 DIAGNOSIS — N2581 Secondary hyperparathyroidism of renal origin: Secondary | ICD-10-CM | POA: Diagnosis not present

## 2022-03-25 DIAGNOSIS — N2581 Secondary hyperparathyroidism of renal origin: Secondary | ICD-10-CM | POA: Diagnosis not present

## 2022-03-25 DIAGNOSIS — N186 End stage renal disease: Secondary | ICD-10-CM | POA: Diagnosis not present

## 2022-03-25 DIAGNOSIS — Z992 Dependence on renal dialysis: Secondary | ICD-10-CM | POA: Diagnosis not present

## 2022-05-08 IMAGING — CT CT CHEST W/ CM
2 of 4 series · 14 of 36 positions shown, 17 images · IV contrast (omnipaque)
Comparison: None.

CLINICAL DATA: Soft tissue mass, breast mass versus infection

EXAM:
CT CHEST WITH CONTRAST
TECHNIQUE: Multidetector CT imaging of the chest was performed during
intravenous contrast administration.
CONTRAST:  100mL OMNIPAQUE IOHEXOL 300 MG/ML  SOLN

[Series 3: chest w · axial · 0.98mm/px · z∈[-456,-184]mm · 11 of 162 slices shown, 14 images]
[im 13/162  mediastinal]
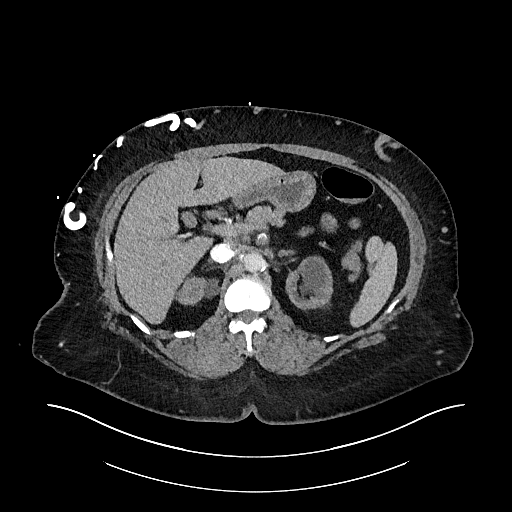
[im 13/162  lung]
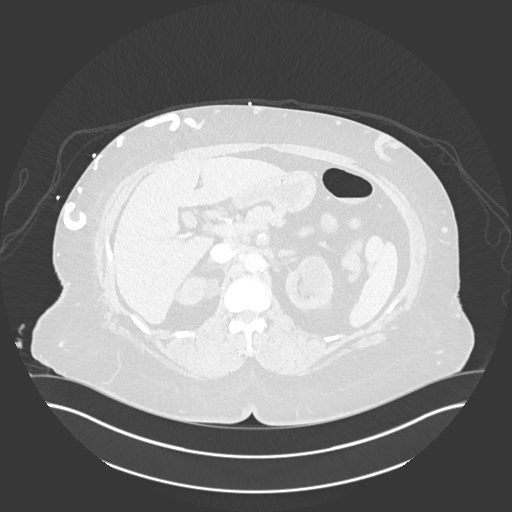
[im 25/162  lung]
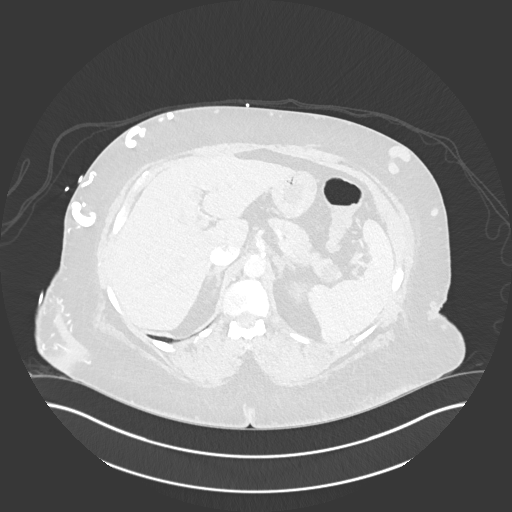
[im 38/162  lung]
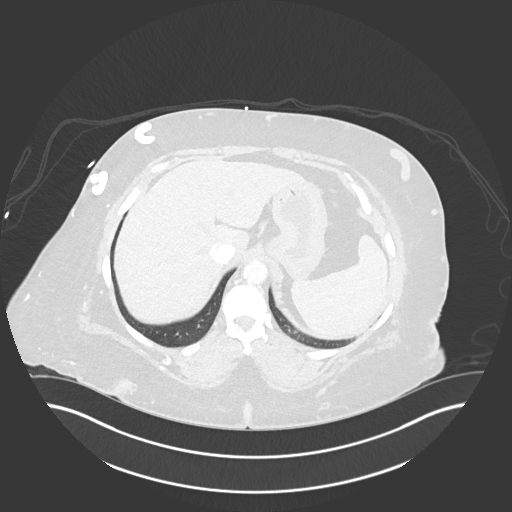
[im 50/162  lung]
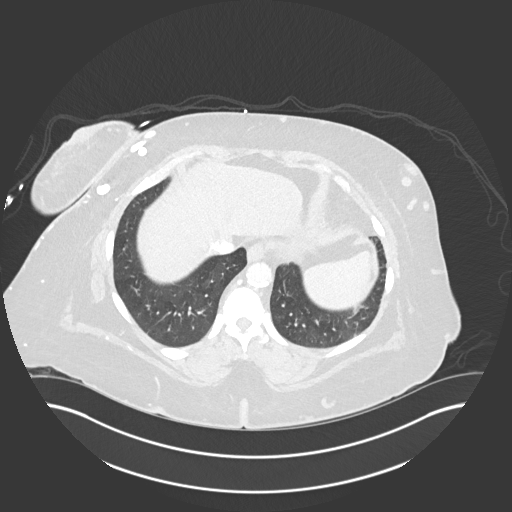
[im 62/162  mediastinal]
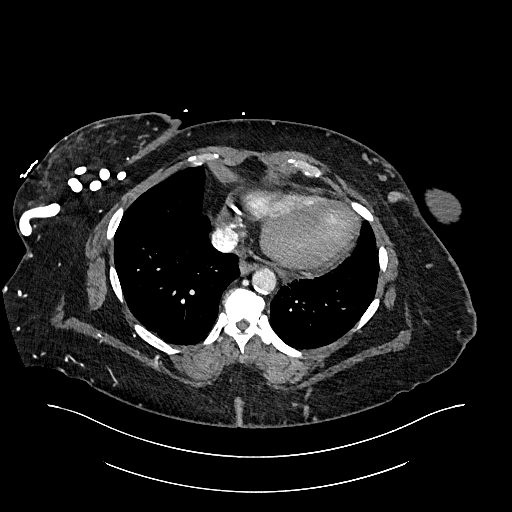
[im 62/162  lung]
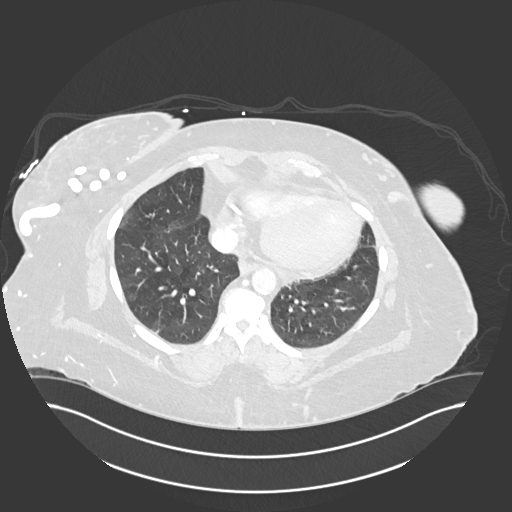
[im 87/162  lung]
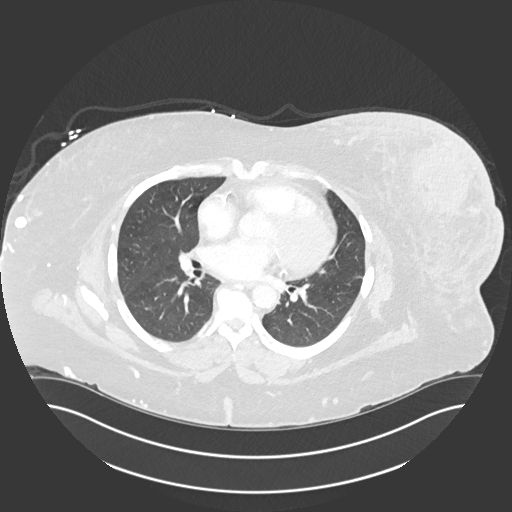
[im 100/162  lung]
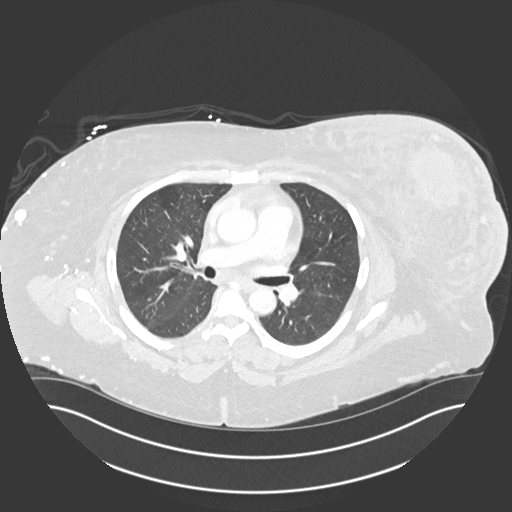
[im 112/162  lung]
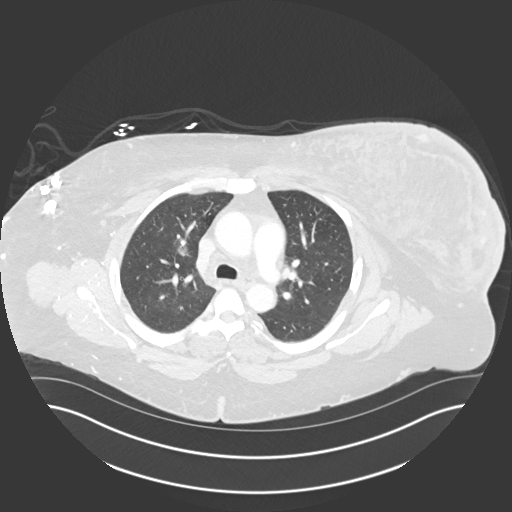
[im 124/162  mediastinal]
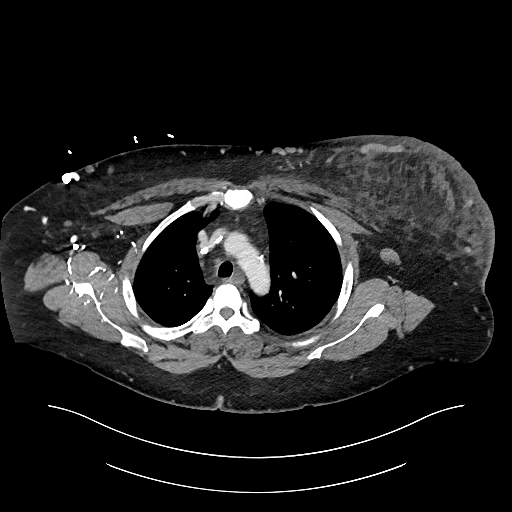
[im 124/162  lung]
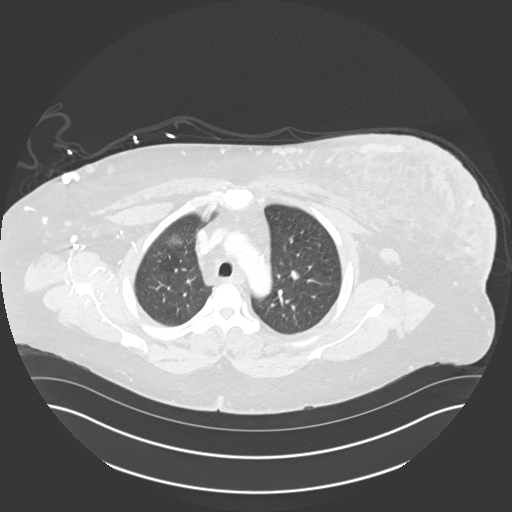
[im 137/162  lung]
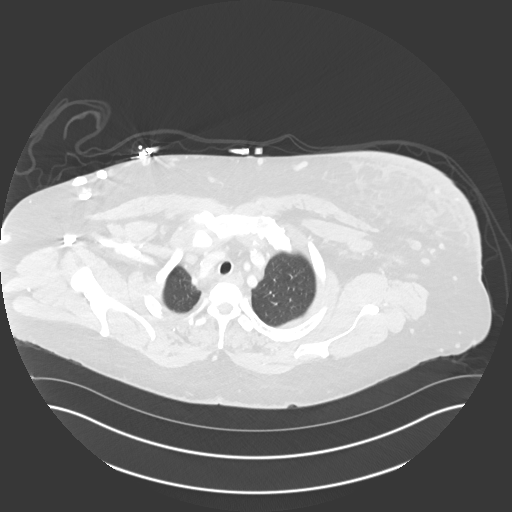
[im 149/162  lung]
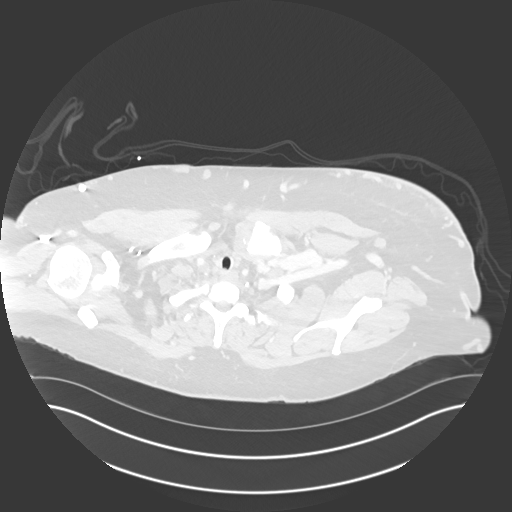

[Series 6: cor · coronal · 0.68mm/px · 3 of 165 slices shown]
[im 33/165  lung]
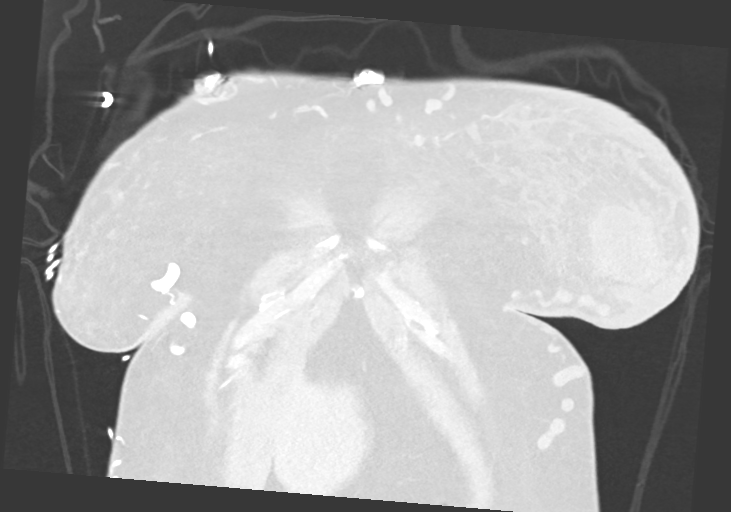
[im 66/165  lung]
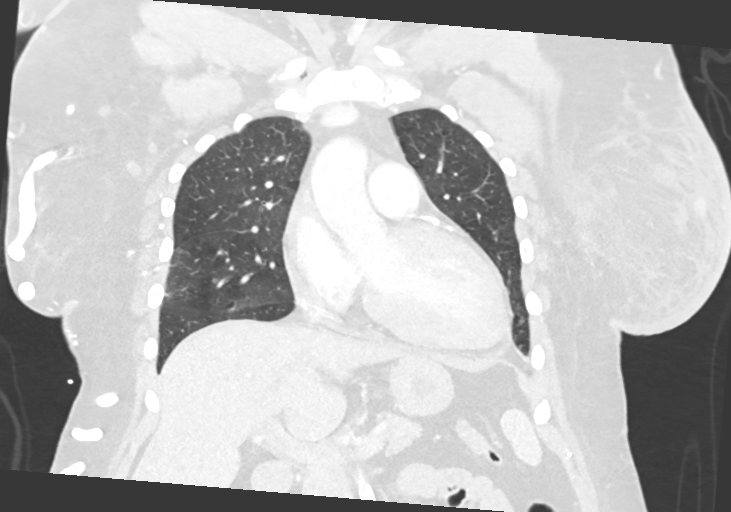
[im 99/165  lung]
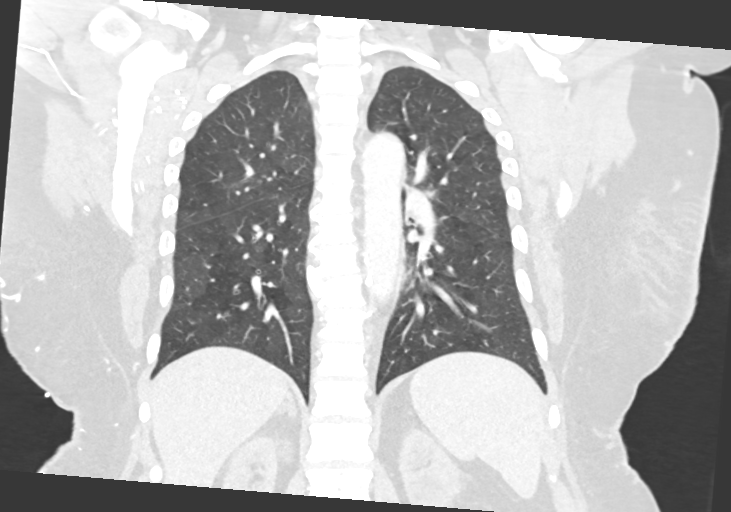

[14 of 36 positions shown; findings below may reference images not displayed]

FINDINGS: Cardiovascular: Aortic atherosclerosis. Normal heart size.
Three-vessel coronary artery calcifications and/or stents. No
pericardial effusion. The central superior vena cava is narrowed and
effaced with intraluminal calcifications and extensive superficial
vascular collateralization about the chest wall (series 3, image 43,
series 6, image 75).

Mediastinum/Nodes: No enlarged mediastinal or hilar lymph nodes.
Enlarged left axillary and subpectoral lymph nodes measuring up to
2.0 x 1.2 cm (series 3, image 25). Thyroid gland, trachea, and
esophagus demonstrate no significant findings.

Lungs/Pleura: Lungs are clear. No pleural effusion or pneumothorax.

Upper Abdomen: No acute abnormality. Atrophic appearance of the
partially included kidneys in the upper abdomen.

Musculoskeletal: There is extensive subcutaneous fat stranding and
skin thickening of the left breast. There is a internally
hypoattenuating mass or fluid collection in the subareolar left
breast measuring 7.3 x 7.0 x 4.8 cm (series 3, image 69, series 6,
image 37).
IMPRESSION: 1. There is extensive subcutaneous fat stranding and skin thickening
of the left breast. There is a internally hypoattenuating mass or
fluid collection in the subareolar left breast measuring 7.3 x 7.0 x
4.8 cm. Enlarged left axillary and subpectoral lymph nodes. This may
reflect a benign breast abscess depending upon clinical
presentation, but locally advanced malignancy with involvement of
the skin and lymph nodes would be difficult to confidently
distinguish based on CT. Recommend initial targeted ultrasound to
assess for drainable fluid collection.
2. The central superior vena cava is narrowed and effaced with
intraluminal calcifications and extensive superficial vascular
collateralization about the chest wall, generally suggestive chronic
central venous stenosis secondary to prior indwelling catheter
access.
3. Coronary artery disease and aortic atherosclerosis, markedly
advanced for patient age.

Aortic Atherosclerosis ([id number]).

## 2022-05-08 IMAGING — US US BREAST*L* COMPLETE INC AXILLA
1 series · 14 of 16 positions shown · non-contrast
Comparison: CT 01/05/2021.

CLINICAL DATA: Follow-up abnormal CT chest.

EXAM:
ULTRASOUND OF THE LEFT BREAST

[Series 1: us breast complete uni left inc axilla · 16 acquisitions, 14 frames shown]
[im 1/16]
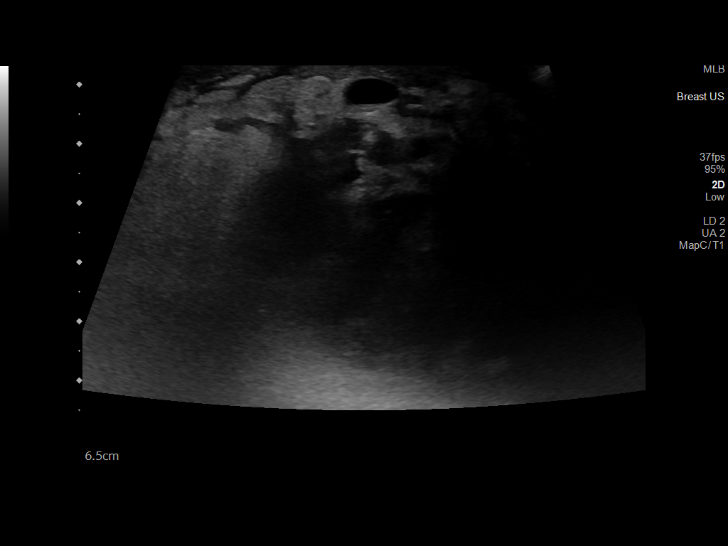
[im 2/16]
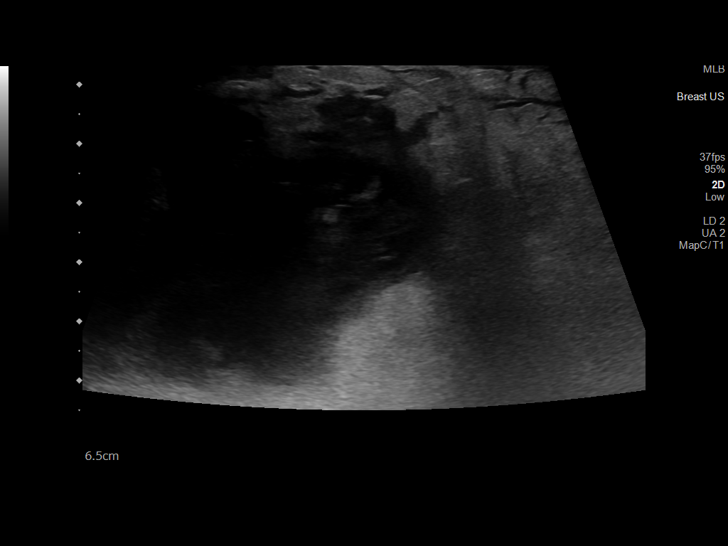
[im 3/16]
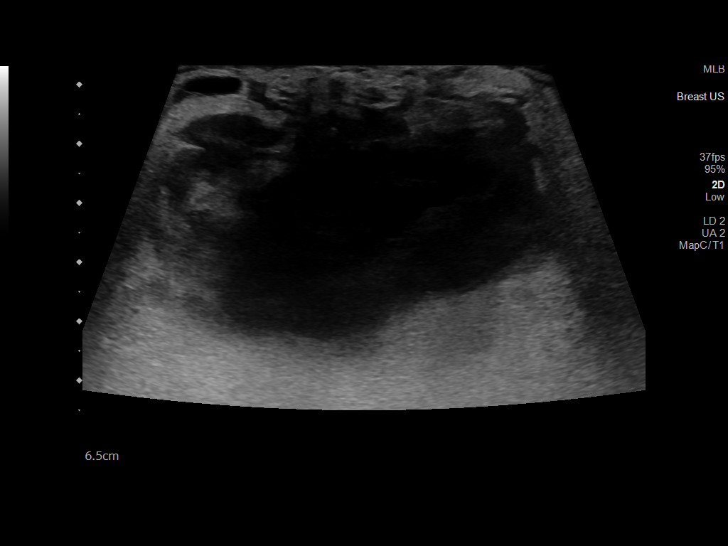
[im 5/16]
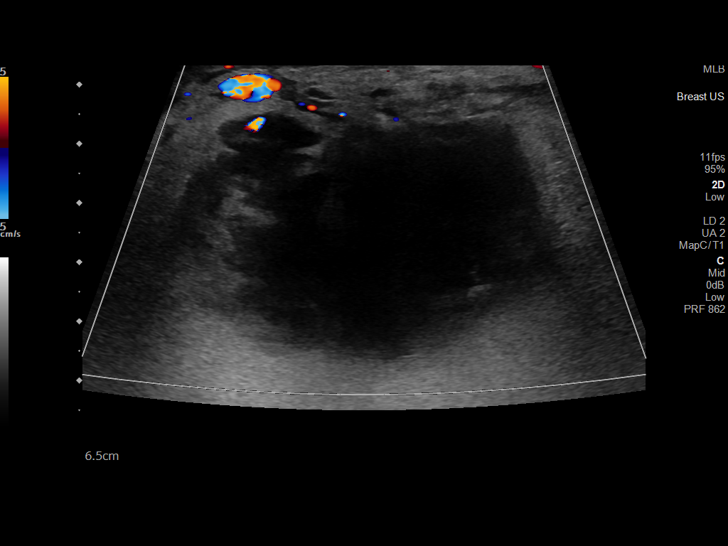
[im 6/16]
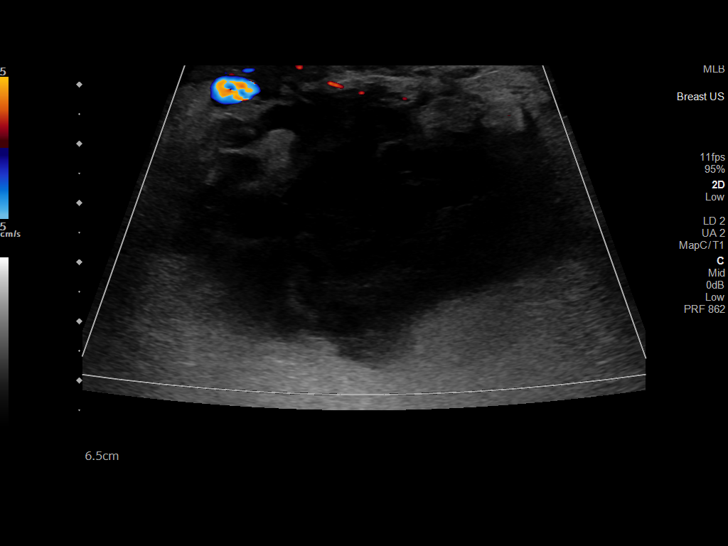
[im 7/16]
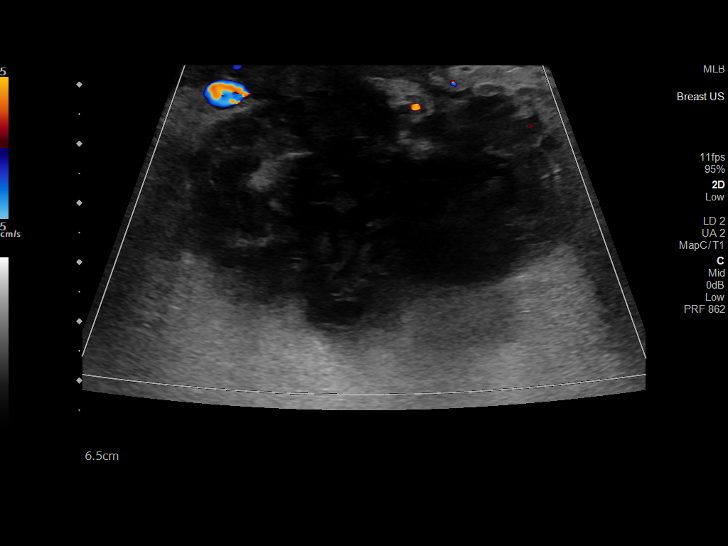
[im 8/16]
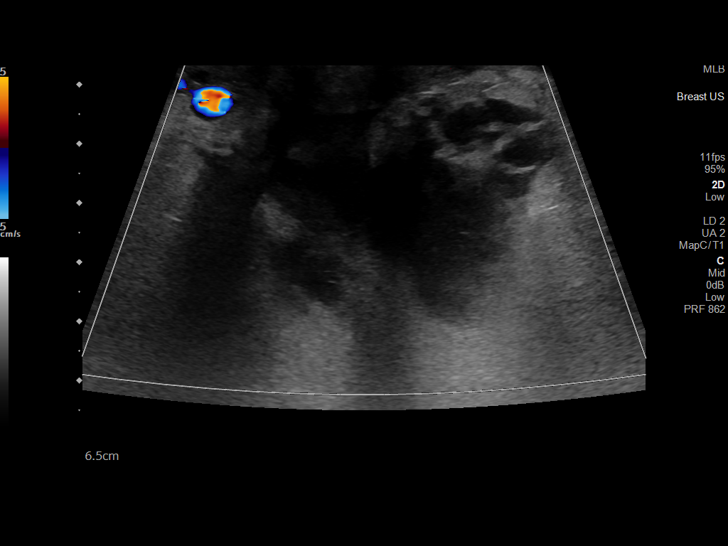
[im 9/16]
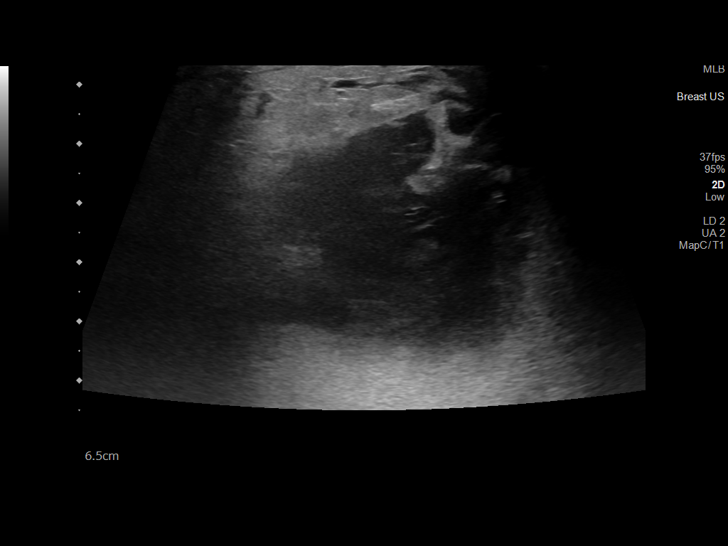
[im 10/16]
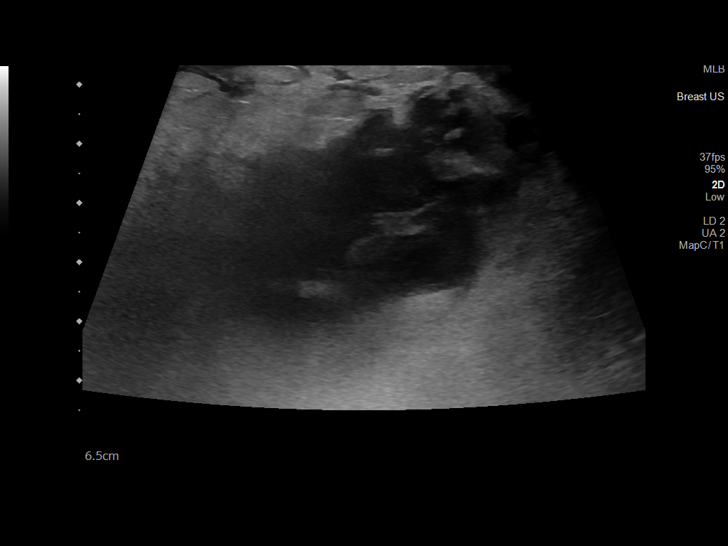
[im 11/16]
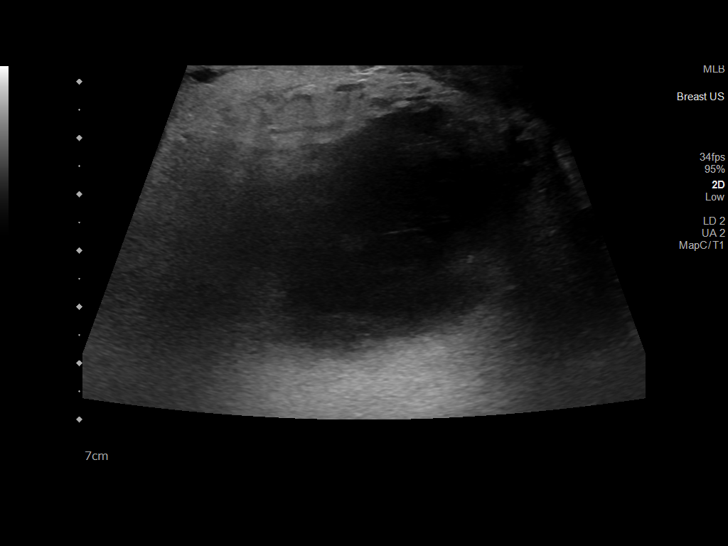
[im 13/16]
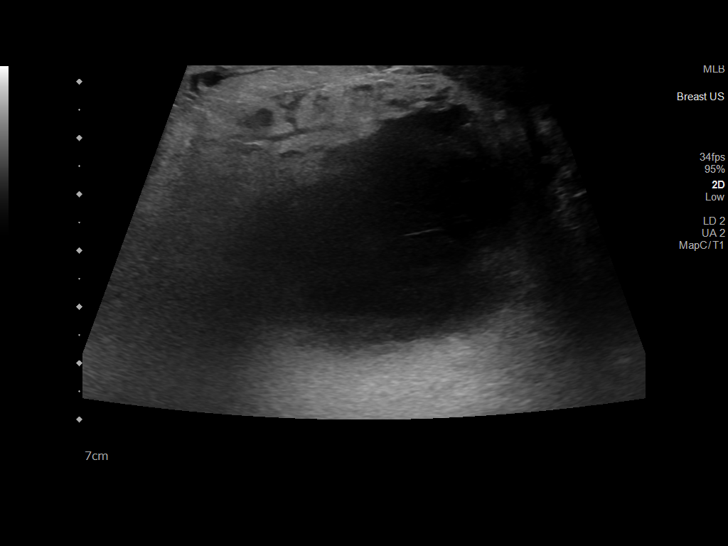
[im 14/16]
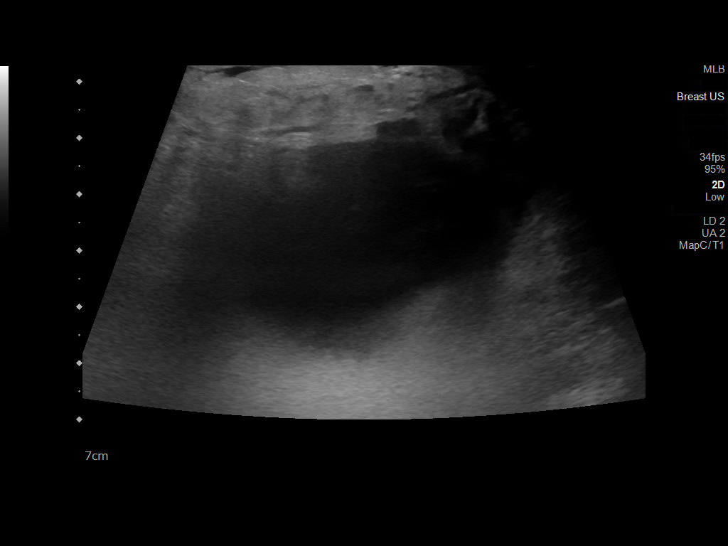
[im 15/16]
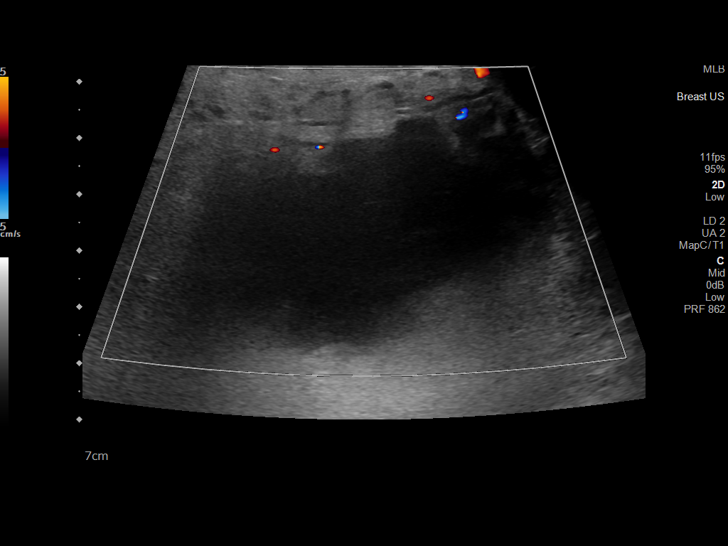
[im 16/16]
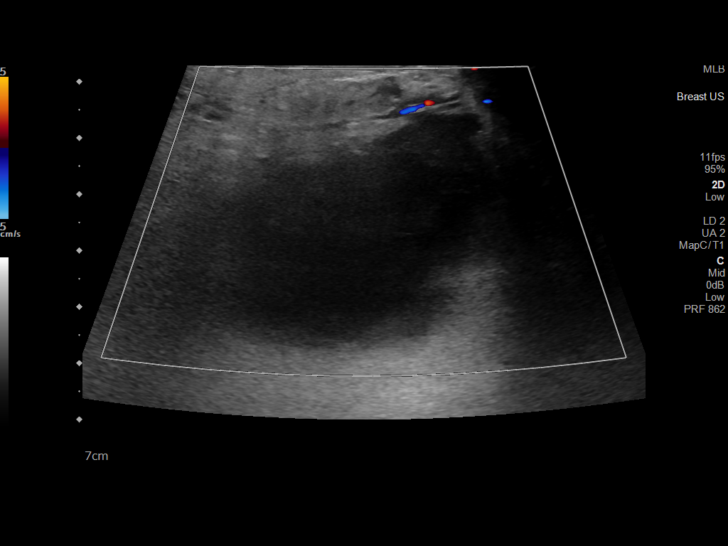

[14 of 16 positions shown; findings below may reference images not displayed]

FINDINGS: Left breast ultrasound reveals a 6.2 x 4.5 x 7.2 cm complex cystic
mass. Although tumor cannot be excluded, abscess and or hematoma
would be more likely. Close follow-up exam suggested to demonstrate
complete resolution in order to exclude a tumor.
IMPRESSION: Left breast soft tissue edema. Left breast complex cystic mass.
Although tumor cannot be excluded, abscess and or hematoma would be
more likely. Aspiration of the left breast cystic mass should be
considered.

RECOMMENDATION:
Aspiration of the left breast cystic mass should be considered.
Close follow-up exams suggested to demonstrate complete resolution
in order to exclude a tumor.

I

BI-RADS CATEGORY  BI-RADS category 4.

## 2022-06-17 ENCOUNTER — Encounter (HOSPITAL_COMMUNITY): Payer: Self-pay

## 2022-06-17 ENCOUNTER — Observation Stay (HOSPITAL_COMMUNITY)
Admission: EM | Admit: 2022-06-17 | Discharge: 2022-06-18 | Disposition: A | Discharge status: Home / Self Care | Payer: Medicare HMO | Attending: Emergency Medicine | Admitting: Emergency Medicine

## 2022-06-17 ENCOUNTER — Emergency Department (HOSPITAL_COMMUNITY): Payer: Medicare HMO

## 2022-06-17 ENCOUNTER — Other Ambulatory Visit: Payer: Self-pay

## 2022-06-17 DIAGNOSIS — Z87891 Personal history of nicotine dependence: Secondary | ICD-10-CM | POA: Diagnosis not present

## 2022-06-17 DIAGNOSIS — K219 Gastro-esophageal reflux disease without esophagitis: Secondary | ICD-10-CM | POA: Insufficient documentation

## 2022-06-17 DIAGNOSIS — M7989 Other specified soft tissue disorders: Secondary | ICD-10-CM | POA: Diagnosis present

## 2022-06-17 DIAGNOSIS — I82210 Acute embolism and thrombosis of superior vena cava: Principal | ICD-10-CM | POA: Insufficient documentation

## 2022-06-17 DIAGNOSIS — Z79899 Other long term (current) drug therapy: Secondary | ICD-10-CM | POA: Insufficient documentation

## 2022-06-17 DIAGNOSIS — N186 End stage renal disease: Secondary | ICD-10-CM

## 2022-06-17 DIAGNOSIS — R2231 Localized swelling, mass and lump, right upper limb: Secondary | ICD-10-CM | POA: Diagnosis not present

## 2022-06-17 DIAGNOSIS — E876 Hypokalemia: Secondary | ICD-10-CM

## 2022-06-17 DIAGNOSIS — Z992 Dependence on renal dialysis: Secondary | ICD-10-CM | POA: Insufficient documentation

## 2022-06-17 DIAGNOSIS — I120 Hypertensive chronic kidney disease with stage 5 chronic kidney disease or end stage renal disease: Secondary | ICD-10-CM | POA: Diagnosis not present

## 2022-06-17 DIAGNOSIS — Z89422 Acquired absence of other left toe(s): Secondary | ICD-10-CM | POA: Diagnosis not present

## 2022-06-17 DIAGNOSIS — I82621 Acute embolism and thrombosis of deep veins of right upper extremity: Secondary | ICD-10-CM

## 2022-06-17 DIAGNOSIS — I871 Compression of vein: Secondary | ICD-10-CM

## 2022-06-17 DIAGNOSIS — I10 Essential (primary) hypertension: Secondary | ICD-10-CM | POA: Diagnosis present

## 2022-06-17 DIAGNOSIS — I879 Disorder of vein, unspecified: Secondary | ICD-10-CM | POA: Diagnosis present

## 2022-06-17 DIAGNOSIS — F17200 Nicotine dependence, unspecified, uncomplicated: Secondary | ICD-10-CM

## 2022-06-17 LAB — CBC WITH DIFFERENTIAL/PLATELET
Abs Immature Granulocytes: 0.02 10*3/uL (ref 0.00–0.07)
Basophils Absolute: 0 10*3/uL (ref 0.0–0.1)
Basophils Relative: 1 %
Eosinophils Absolute: 0.2 10*3/uL (ref 0.0–0.5)
Eosinophils Relative: 3 %
HCT: 26.2 % — ABNORMAL LOW (ref 36.0–46.0)
Hemoglobin: 8.2 g/dL — ABNORMAL LOW (ref 12.0–15.0)
Immature Granulocytes: 0 %
Lymphocytes Relative: 8 %
Lymphs Abs: 0.4 10*3/uL — ABNORMAL LOW (ref 0.7–4.0)
MCH: 28.1 pg (ref 26.0–34.0)
MCHC: 31.3 g/dL (ref 30.0–36.0)
MCV: 89.7 fL (ref 80.0–100.0)
Monocytes Absolute: 0.3 10*3/uL (ref 0.1–1.0)
Monocytes Relative: 6 %
Neutro Abs: 4.8 10*3/uL (ref 1.7–7.7)
Neutrophils Relative %: 82 %
Platelets: 200 10*3/uL (ref 150–400)
RBC: 2.92 MIL/uL — ABNORMAL LOW (ref 3.87–5.11)
RDW: 19.7 % — ABNORMAL HIGH (ref 11.5–15.5)
WBC: 5.8 10*3/uL (ref 4.0–10.5)
nRBC: 0 % (ref 0.0–0.2)

## 2022-06-17 LAB — BASIC METABOLIC PANEL
Anion gap: 11 (ref 5–15)
BUN: 19 mg/dL (ref 6–20)
CO2: 27 mmol/L (ref 22–32)
Calcium: 9.1 mg/dL (ref 8.9–10.3)
Chloride: 98 mmol/L (ref 98–111)
Creatinine, Ser: 5.68 mg/dL — ABNORMAL HIGH (ref 0.44–1.00)
GFR, Estimated: 9 mL/min — ABNORMAL LOW (ref >60)
Glucose, Bld: 121 mg/dL — ABNORMAL HIGH (ref 70–99)
Potassium: 2.9 mmol/L — ABNORMAL LOW (ref 3.5–5.1)
Sodium: 136 mmol/L (ref 135–145)

## 2022-06-17 MED ORDER — FENTANYL CITRATE PF 50 MCG/ML IJ SOSY
50.0000 ug | PREFILLED_SYRINGE | Freq: Once | INTRAMUSCULAR | Status: AC
  Administered 2022-06-17: 50 ug via INTRAVENOUS
  Filled 2022-06-17: qty 1

## 2022-06-17 MED ORDER — IOHEXOL 300 MG/ML  SOLN
100.0000 mL | Freq: Once | INTRAMUSCULAR | Status: AC | PRN
  Administered 2022-06-17: 100 mL via INTRAVENOUS

## 2022-06-17 NOTE — ED Provider Notes (Signed)
[facility] EMERGENCY DEPARTMENT Provider Note   CSN: [id number] Arrival date & time: 06/17/22  1906     History  Chief Complaint  Patient presents with   Arm Swelling    Rebekah Burton is a 46 y.o. female.  HPI    46 year old female comes in with chief complaint of arm swelling. Patient has history of hypertension, ESRD on HD and is legally blind.  She states that she woke up yesterday and noted some right hand swelling.  Today she felt like her entire right upper extremity swollen, and there is also now some pain and discomfort over the right upper extremity, prompting her to come to the ER.  Patient had her hemodialysis done earlier today.  Hemodialysis is typically done through the left upper extremity.  She has no history of DVT and denies any instrumentation over the right upper extremity recently.  She also states that her face is puffier than usual on the right side.   Home Medications Prior to Admission medications   Medication Sig Start Date End Date Taking? Authorizing Provider  amLODipine (NORVASC) 10 MG tablet Take 10 mg by mouth daily. 05/26/22  Yes [provider]  APIXABAN Arne [place]) VTE STARTER PACK (10MG  AND 5MG ) Take as directed on package: start with two-5mg  tablets twice daily for 7 days. On day 8, switch to one-5mg  tablet twice daily. 06/18/22  Yes Little Ishikawa, MD  b complex-vitamin c-folic acid (NEPHRO-VITE) 0.8 MG TABS tablet Take 1 tablet by mouth every Monday, Wednesday, and Friday with hemodialysis. 03/17/21  Yes Samella Parr, NP  carvedilol (COREG) 25 MG tablet Take 1 tablet (25 mg total) by mouth 2 (two) times daily. 03/17/21  Yes Samella Parr, NP  cloNIDine (CATAPRES) 0.3 MG tablet Take 1 tablet (0.3 mg total) by mouth 3 (three) times daily. 03/17/21  Yes Samella Parr, NP  ibuprofen (ADVIL) 600 MG tablet Take 1 tablet (600 mg total) by mouth 3 (three) times daily. Patient taking differently: Take 600 mg by mouth every 8 (eight)  hours as needed (pain). 03/17/21  Yes Samella Parr, NP  lanthanum (FOSRENOL) 1000 MG chewable tablet Chew 1 tablet (1,000 mg total) by mouth 3 (three) times daily with meals. 03/18/21  Yes Samella Parr, NP  pantoprazole (PROTONIX) 40 MG tablet Take 1 tablet (40 mg total) by mouth daily. 03/17/21  Yes Samella Parr, NP  senna (SENOKOT) 8.6 MG TABS tablet Take 1 tablet (8.6 mg total) by mouth 2 (two) times daily. 03/17/21  Yes Samella Parr, NP  traZODone (DESYREL) 150 MG tablet Take 150 mg by mouth at bedtime. 05/01/21  Yes [provider]      Allergies    Patient has no known allergies.    Review of Systems   Review of Systems  All other systems reviewed and are negative.   Physical Exam Updated Vital Signs BP 95/69   Pulse 90   Temp 98.8 F (37.1 C) (Oral)   Resp 18   Ht 5\' 4"  (1.626 m)   Wt 100.7 kg   SpO2 99%   BMI 38.11 kg/m  Physical Exam Vitals and nursing note reviewed.  Constitutional:      Appearance: She is well-developed.  HENT:     Head: Atraumatic.     Comments: Right side of the face appears slightly edematous, no cervical lymphadenopathy or bruit appreciated Cardiovascular:     Rate and Rhythm: Normal rate.  Pulmonary:     Effort: Pulmonary  effort is normal.  Musculoskeletal:     Cervical back: Normal range of motion and neck supple.     Comments: Patient has right upper extremity swelling with soft compartments.  She has 2+ radial pulse and there is no clear erythema or warmth to touch  Skin:    General: Skin is warm and dry.  Neurological:     Mental Status: She is alert and oriented to person, place, and time.     ED Results / Procedures / Treatments   Labs (all labs ordered are listed, but only abnormal results are displayed) Labs Reviewed  BASIC METABOLIC PANEL - Abnormal; Notable for the following components:      Result Value   Potassium 2.9 (*)    Glucose, Bld 121 (*)    Creatinine, Ser 5.68 (*)    GFR, Estimated 9 (*)     All other components within normal limits  CBC WITH DIFFERENTIAL/PLATELET - Abnormal; Notable for the following components:   RBC 2.92 (*)    Hemoglobin 8.2 (*)    HCT 26.2 (*)    RDW 19.7 (*)    Lymphs Abs 0.4 (*)    All other components within normal limits  CBC WITH DIFFERENTIAL/PLATELET - Abnormal; Notable for the following components:   RBC 2.84 (*)    Hemoglobin 7.8 (*)    HCT 25.9 (*)    RDW 19.9 (*)    Lymphs Abs 0.4 (*)    All other components within normal limits  COMPREHENSIVE METABOLIC PANEL - Abnormal; Notable for the following components:   Potassium 3.1 (*)    Glucose, Bld 106 (*)    Creatinine, Ser 6.49 (*)    Total Protein 6.4 (*)    Albumin 3.2 (*)    GFR, Estimated 8 (*)    All other components within normal limits  HIV ANTIBODY (ROUTINE TESTING W REFLEX)  MAGNESIUM  PHOSPHORUS    EKG None  Radiology No results found.  Procedures .Critical Care  Performed by: Varney Biles, MD Authorized by: Varney Biles, MD   Critical care provider statement:    Critical care time (minutes):  40   Critical care was necessary to treat or prevent imminent or life-threatening deterioration of the following conditions:  Circulatory failure   Critical care was time spent personally by me on the following activities:  Development of treatment plan with patient or surrogate, discussions with consultants, evaluation of patient's response to treatment, examination of patient, ordering and review of laboratory studies, ordering and review of radiographic studies, ordering and performing treatments and interventions, pulse oximetry, re-evaluation of patient's condition and review of old charts     Medications Ordered in ED Medications  fentaNYL (SUBLIMAZE) injection 50 mcg (50 mcg Intravenous Given 06/17/22 2151)  iohexol (OMNIPAQUE) 300 MG/ML solution 100 mL (100 mLs Intravenous Contrast Given 06/17/22 2321)  fentaNYL (SUBLIMAZE) injection 50 mcg (50 mcg Intravenous  Given 06/18/22 0014)    ED Course/ Medical Decision Making/ A&P                           Medical Decision Making Amount and/or Complexity of Data Reviewed Labs: ordered. Radiology: ordered.  Risk Prescription drug management. Decision regarding hospitalization.   This patient presents to the ED with chief complaint(s) of right upper extremity swelling with pertinent past medical history of ESRD on HD, hypertension which further complicates the presenting complaint. The complaint involves an extensive differential diagnosis and also carries with  it a high risk of complications and morbidity.    The differential diagnosis includes : Given that there is also facial swelling involved, we will have to rule out thoracic outlet syndrome, tumor that is compressing the subclavian vasculature.  Unlikely to be an arterial insufficiency issue, as patient has 2+ radial pulse.  No evidence of compartment syndrome.  No clear evidence of infection in the right upper extremity either.  Plan is to get basic labs along with CT scan of the chest with contrast.   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 Incoming staff to follow up on the read and consult appropriate service. Patient is neurologically intact.  Final Clinical Impression(s) / ED Diagnoses Final diagnoses:  SVC syndrome    Rx / DC Orders ED Discharge Orders          Ordered    APIXABAN (ELIQUIS) VTE STARTER PACK (10MG  AND 5MG )        06/18/22 1545    Discharge patient        06/18/22 [place], Lavante Toso, MD 06/25/22 1026

## 2022-06-18 ENCOUNTER — Observation Stay (HOSPITAL_BASED_OUTPATIENT_CLINIC_OR_DEPARTMENT_OTHER): Payer: Medicare HMO

## 2022-06-18 DIAGNOSIS — I120 Hypertensive chronic kidney disease with stage 5 chronic kidney disease or end stage renal disease: Secondary | ICD-10-CM | POA: Diagnosis not present

## 2022-06-18 DIAGNOSIS — I10 Essential (primary) hypertension: Secondary | ICD-10-CM

## 2022-06-18 DIAGNOSIS — Z992 Dependence on renal dialysis: Secondary | ICD-10-CM | POA: Diagnosis not present

## 2022-06-18 DIAGNOSIS — I82210 Acute embolism and thrombosis of superior vena cava: Secondary | ICD-10-CM | POA: Diagnosis not present

## 2022-06-18 DIAGNOSIS — K219 Gastro-esophageal reflux disease without esophagitis: Secondary | ICD-10-CM

## 2022-06-18 DIAGNOSIS — I879 Disorder of vein, unspecified: Secondary | ICD-10-CM | POA: Diagnosis not present

## 2022-06-18 DIAGNOSIS — Z89422 Acquired absence of other left toe(s): Secondary | ICD-10-CM | POA: Diagnosis not present

## 2022-06-18 DIAGNOSIS — Z79899 Other long term (current) drug therapy: Secondary | ICD-10-CM | POA: Diagnosis not present

## 2022-06-18 DIAGNOSIS — N186 End stage renal disease: Secondary | ICD-10-CM

## 2022-06-18 DIAGNOSIS — Z87891 Personal history of nicotine dependence: Secondary | ICD-10-CM | POA: Diagnosis not present

## 2022-06-18 DIAGNOSIS — M7989 Other specified soft tissue disorders: Secondary | ICD-10-CM | POA: Diagnosis present

## 2022-06-18 DIAGNOSIS — E876 Hypokalemia: Secondary | ICD-10-CM

## 2022-06-18 DIAGNOSIS — R609 Edema, unspecified: Secondary | ICD-10-CM | POA: Diagnosis not present

## 2022-06-18 DIAGNOSIS — F17200 Nicotine dependence, unspecified, uncomplicated: Secondary | ICD-10-CM

## 2022-06-18 LAB — CBC WITH DIFFERENTIAL/PLATELET
Abs Immature Granulocytes: 0.02 10*3/uL (ref 0.00–0.07)
Basophils Absolute: 0.1 10*3/uL (ref 0.0–0.1)
Basophils Relative: 1 %
Eosinophils Absolute: 0.2 10*3/uL (ref 0.0–0.5)
Eosinophils Relative: 3 %
HCT: 25.9 % — ABNORMAL LOW (ref 36.0–46.0)
Hemoglobin: 7.8 g/dL — ABNORMAL LOW (ref 12.0–15.0)
Immature Granulocytes: 0 %
Lymphocytes Relative: 8 %
Lymphs Abs: 0.4 10*3/uL — ABNORMAL LOW (ref 0.7–4.0)
MCH: 27.5 pg (ref 26.0–34.0)
MCHC: 30.1 g/dL (ref 30.0–36.0)
MCV: 91.2 fL (ref 80.0–100.0)
Monocytes Absolute: 0.3 10*3/uL (ref 0.1–1.0)
Monocytes Relative: 6 %
Neutro Abs: 4.3 10*3/uL (ref 1.7–7.7)
Neutrophils Relative %: 82 %
Platelets: 185 10*3/uL (ref 150–400)
RBC: 2.84 MIL/uL — ABNORMAL LOW (ref 3.87–5.11)
RDW: 19.9 % — ABNORMAL HIGH (ref 11.5–15.5)
WBC: 5.3 10*3/uL (ref 4.0–10.5)
nRBC: 0 % (ref 0.0–0.2)

## 2022-06-18 LAB — COMPREHENSIVE METABOLIC PANEL
ALT: 12 U/L (ref 0–44)
AST: 15 U/L (ref 15–41)
Albumin: 3.2 g/dL — ABNORMAL LOW (ref 3.5–5.0)
Alkaline Phosphatase: 48 U/L (ref 38–126)
Anion gap: 13 (ref 5–15)
BUN: 20 mg/dL (ref 6–20)
CO2: 25 mmol/L (ref 22–32)
Calcium: 9.5 mg/dL (ref 8.9–10.3)
Chloride: 98 mmol/L (ref 98–111)
Creatinine, Ser: 6.49 mg/dL — ABNORMAL HIGH (ref 0.44–1.00)
GFR, Estimated: 8 mL/min — ABNORMAL LOW (ref >60)
Glucose, Bld: 106 mg/dL — ABNORMAL HIGH (ref 70–99)
Potassium: 3.1 mmol/L — ABNORMAL LOW (ref 3.5–5.1)
Sodium: 136 mmol/L (ref 135–145)
Total Bilirubin: 0.8 mg/dL (ref 0.3–1.2)
Total Protein: 6.4 g/dL — ABNORMAL LOW (ref 6.5–8.1)

## 2022-06-18 LAB — HIV ANTIBODY (ROUTINE TESTING W REFLEX): HIV Screen 4th Generation wRfx: NONREACTIVE

## 2022-06-18 LAB — MAGNESIUM: Magnesium: 1.8 mg/dL (ref 1.7–2.4)

## 2022-06-18 LAB — PHOSPHORUS: Phosphorus: 4.2 mg/dL (ref 2.5–4.6)

## 2022-06-18 MED ORDER — ACETAMINOPHEN 650 MG RE SUPP: 650.0000 mg | Freq: Four times a day (QID) | RECTAL | Status: DC | PRN

## 2022-06-18 MED ORDER — PANTOPRAZOLE SODIUM 40 MG PO TBEC
40.0000 mg | DELAYED_RELEASE_TABLET | Freq: Every day | ORAL | Status: DC
  Administered 2022-06-18: 40 mg via ORAL
  Filled 2022-06-18: qty 1

## 2022-06-18 MED ORDER — OXYCODONE HCL 5 MG PO TABS
5.0000 mg | ORAL_TABLET | Freq: Four times a day (QID) | ORAL | Status: DC | PRN
  Administered 2022-06-18 (×3): 5 mg via ORAL
  Filled 2022-06-18 (×3): qty 1

## 2022-06-18 MED ORDER — APIXABAN 5 MG PO TABS: 5.0000 mg | ORAL_TABLET | Freq: Two times a day (BID) | ORAL | Status: DC

## 2022-06-18 MED ORDER — FENTANYL CITRATE PF 50 MCG/ML IJ SOSY
50.0000 ug | PREFILLED_SYRINGE | Freq: Once | INTRAMUSCULAR | Status: AC
  Administered 2022-06-18: 50 ug via INTRAVENOUS
  Filled 2022-06-18: qty 1

## 2022-06-18 MED ORDER — ACETAMINOPHEN 325 MG PO TABS
650.0000 mg | ORAL_TABLET | Freq: Four times a day (QID) | ORAL | Status: DC | PRN
  Administered 2022-06-18: 650 mg via ORAL
  Filled 2022-06-18: qty 2

## 2022-06-18 MED ORDER — AMLODIPINE BESYLATE 10 MG PO TABS
10.0000 mg | ORAL_TABLET | Freq: Every day | ORAL | Status: DC
  Administered 2022-06-18: 10 mg via ORAL
  Filled 2022-06-18: qty 1

## 2022-06-18 MED ORDER — CARVEDILOL 25 MG PO TABS
25.0000 mg | ORAL_TABLET | Freq: Two times a day (BID) | ORAL | Status: DC
  Administered 2022-06-18: 25 mg via ORAL
  Filled 2022-06-18 (×2): qty 1

## 2022-06-18 MED ORDER — APIXABAN (ELIQUIS) VTE STARTER PACK (10MG AND 5MG): ORAL_TABLET | ORAL | 0 refills | Status: DC

## 2022-06-18 MED ORDER — APIXABAN 5 MG PO TABS
10.0000 mg | ORAL_TABLET | Freq: Two times a day (BID) | ORAL | Status: DC
  Administered 2022-06-18: 10 mg via ORAL
  Filled 2022-06-18: qty 2

## 2022-06-18 MED ORDER — CLONIDINE HCL 0.3 MG PO TABS
0.3000 mg | ORAL_TABLET | Freq: Three times a day (TID) | ORAL | Status: DC
  Administered 2022-06-18 (×2): 0.3 mg via ORAL
  Filled 2022-06-18 (×2): qty 1

## 2022-06-18 MED ORDER — MORPHINE SULFATE (PF) 2 MG/ML IV SOLN
2.0000 mg | INTRAVENOUS | Status: DC | PRN
  Administered 2022-06-18 (×2): 2 mg via INTRAVENOUS
  Filled 2022-06-18 (×3): qty 1

## 2022-06-18 MED ORDER — SENNA 8.6 MG PO TABS
1.0000 | ORAL_TABLET | Freq: Two times a day (BID) | ORAL | Status: DC
  Filled 2022-06-18: qty 1

## 2022-06-18 MED ORDER — TRAZODONE HCL 50 MG PO TABS: 150.0000 mg | ORAL_TABLET | Freq: Every day | ORAL | Status: DC

## 2022-06-18 MED ORDER — ONDANSETRON HCL 4 MG PO TABS: 4.0000 mg | ORAL_TABLET | Freq: Four times a day (QID) | ORAL | Status: DC | PRN

## 2022-06-18 MED ORDER — ONDANSETRON HCL 4 MG/2ML IJ SOLN: 4.0000 mg | Freq: Four times a day (QID) | INTRAMUSCULAR | Status: DC | PRN

## 2022-06-18 MED ORDER — HYDRALAZINE HCL 20 MG/ML IJ SOLN: 10.0000 mg | Freq: Four times a day (QID) | INTRAMUSCULAR | Status: DC | PRN

## 2022-06-18 MED ORDER — HEPARIN SODIUM (PORCINE) 5000 UNIT/ML IJ SOLN
5000.0000 [IU] | Freq: Three times a day (TID) | INTRAMUSCULAR | Status: DC
Start: 2022-06-18 — End: 2022-06-18
  Administered 2022-06-18 (×2): 5000 [IU] via SUBCUTANEOUS
  Filled 2022-06-18 (×2): qty 1

## 2022-06-18 NOTE — Assessment & Plan Note (Signed)
-   Continue Coreg and clonidine -As needed hydralazine for systolic blood pressure greater than 180

## 2022-07-07 ENCOUNTER — Encounter (HOSPITAL_COMMUNITY): Payer: Self-pay | Admitting: Emergency Medicine

## 2022-07-07 ENCOUNTER — Inpatient Hospital Stay (HOSPITAL_COMMUNITY)
Admission: EM | Admit: 2022-07-07 | Discharge: 2022-07-19 | DRG: 252 | Disposition: A | Discharge status: Home / Self Care | Payer: Medicare HMO | Attending: Family Medicine | Admitting: Family Medicine

## 2022-07-07 ENCOUNTER — Emergency Department (HOSPITAL_COMMUNITY): Payer: Medicare HMO

## 2022-07-07 ENCOUNTER — Other Ambulatory Visit: Payer: Self-pay

## 2022-07-07 DIAGNOSIS — E8809 Other disorders of plasma-protein metabolism, not elsewhere classified: Secondary | ICD-10-CM | POA: Diagnosis present

## 2022-07-07 DIAGNOSIS — K862 Cyst of pancreas: Secondary | ICD-10-CM | POA: Diagnosis present

## 2022-07-07 DIAGNOSIS — Z7901 Long term (current) use of anticoagulants: Secondary | ICD-10-CM

## 2022-07-07 DIAGNOSIS — I739 Peripheral vascular disease, unspecified: Secondary | ICD-10-CM | POA: Diagnosis present

## 2022-07-07 DIAGNOSIS — H548 Legal blindness, as defined in USA: Secondary | ICD-10-CM | POA: Diagnosis present

## 2022-07-07 DIAGNOSIS — D649 Anemia, unspecified: Secondary | ICD-10-CM

## 2022-07-07 DIAGNOSIS — I10 Essential (primary) hypertension: Secondary | ICD-10-CM

## 2022-07-07 DIAGNOSIS — A419 Sepsis, unspecified organism: Secondary | ICD-10-CM

## 2022-07-07 DIAGNOSIS — N186 End stage renal disease: Secondary | ICD-10-CM | POA: Diagnosis present

## 2022-07-07 DIAGNOSIS — R188 Other ascites: Secondary | ICD-10-CM | POA: Diagnosis present

## 2022-07-07 DIAGNOSIS — J810 Acute pulmonary edema: Secondary | ICD-10-CM | POA: Diagnosis present

## 2022-07-07 DIAGNOSIS — K56600 Partial intestinal obstruction, unspecified as to cause: Secondary | ICD-10-CM | POA: Diagnosis present

## 2022-07-07 DIAGNOSIS — Z89431 Acquired absence of right foot: Secondary | ICD-10-CM

## 2022-07-07 DIAGNOSIS — N281 Cyst of kidney, acquired: Secondary | ICD-10-CM | POA: Diagnosis present

## 2022-07-07 DIAGNOSIS — I700 Atherosclerosis of aorta: Secondary | ICD-10-CM | POA: Diagnosis present

## 2022-07-07 DIAGNOSIS — R7989 Other specified abnormal findings of blood chemistry: Secondary | ICD-10-CM

## 2022-07-07 DIAGNOSIS — T444X5A Adverse effect of predominantly alpha-adrenoreceptor agonists, initial encounter: Secondary | ICD-10-CM | POA: Diagnosis not present

## 2022-07-07 DIAGNOSIS — K219 Gastro-esophageal reflux disease without esophagitis: Secondary | ICD-10-CM | POA: Diagnosis present

## 2022-07-07 DIAGNOSIS — A4102 Sepsis due to Methicillin resistant Staphylococcus aureus: Secondary | ICD-10-CM | POA: Diagnosis present

## 2022-07-07 DIAGNOSIS — I4891 Unspecified atrial fibrillation: Secondary | ICD-10-CM | POA: Diagnosis not present

## 2022-07-07 DIAGNOSIS — Z6838 Body mass index (BMI) 38.0-38.9, adult: Secondary | ICD-10-CM

## 2022-07-07 DIAGNOSIS — Z86718 Personal history of other venous thrombosis and embolism: Secondary | ICD-10-CM

## 2022-07-07 DIAGNOSIS — Y832 Surgical operation with anastomosis, bypass or graft as the cause of abnormal reaction of the patient, or of later complication, without mention of misadventure at the time of the procedure: Secondary | ICD-10-CM | POA: Diagnosis present

## 2022-07-07 DIAGNOSIS — Z89422 Acquired absence of other left toe(s): Secondary | ICD-10-CM

## 2022-07-07 DIAGNOSIS — F17200 Nicotine dependence, unspecified, uncomplicated: Secondary | ICD-10-CM | POA: Diagnosis present

## 2022-07-07 DIAGNOSIS — E876 Hypokalemia: Secondary | ICD-10-CM | POA: Diagnosis present

## 2022-07-07 DIAGNOSIS — D631 Anemia in chronic kidney disease: Secondary | ICD-10-CM | POA: Diagnosis present

## 2022-07-07 DIAGNOSIS — N10 Acute tubulo-interstitial nephritis: Secondary | ICD-10-CM | POA: Diagnosis present

## 2022-07-07 DIAGNOSIS — Z8616 Personal history of COVID-19: Secondary | ICD-10-CM

## 2022-07-07 DIAGNOSIS — J9601 Acute respiratory failure with hypoxia: Secondary | ICD-10-CM | POA: Diagnosis not present

## 2022-07-07 DIAGNOSIS — T827XXA Infection and inflammatory reaction due to other cardiac and vascular devices, implants and grafts, initial encounter: Principal | ICD-10-CM | POA: Diagnosis present

## 2022-07-07 DIAGNOSIS — Z79899 Other long term (current) drug therapy: Secondary | ICD-10-CM

## 2022-07-07 DIAGNOSIS — I879 Disorder of vein, unspecified: Secondary | ICD-10-CM | POA: Diagnosis present

## 2022-07-07 DIAGNOSIS — E669 Obesity, unspecified: Secondary | ICD-10-CM | POA: Diagnosis present

## 2022-07-07 DIAGNOSIS — E46 Unspecified protein-calorie malnutrition: Secondary | ICD-10-CM | POA: Diagnosis present

## 2022-07-07 DIAGNOSIS — R7881 Bacteremia: Secondary | ICD-10-CM

## 2022-07-07 DIAGNOSIS — Z992 Dependence on renal dialysis: Secondary | ICD-10-CM

## 2022-07-07 DIAGNOSIS — I120 Hypertensive chronic kidney disease with stage 5 chronic kidney disease or end stage renal disease: Secondary | ICD-10-CM | POA: Diagnosis present

## 2022-07-07 DIAGNOSIS — N2581 Secondary hyperparathyroidism of renal origin: Secondary | ICD-10-CM | POA: Diagnosis present

## 2022-07-07 DIAGNOSIS — I2510 Atherosclerotic heart disease of native coronary artery without angina pectoris: Secondary | ICD-10-CM | POA: Diagnosis present

## 2022-07-07 DIAGNOSIS — I97711 Intraoperative cardiac arrest during other surgery: Secondary | ICD-10-CM | POA: Diagnosis not present

## 2022-07-07 DIAGNOSIS — Z89432 Acquired absence of left foot: Secondary | ICD-10-CM

## 2022-07-07 DIAGNOSIS — Z89421 Acquired absence of other right toe(s): Secondary | ICD-10-CM

## 2022-07-07 DIAGNOSIS — D62 Acute posthemorrhagic anemia: Secondary | ICD-10-CM | POA: Diagnosis present

## 2022-07-07 DIAGNOSIS — Z89411 Acquired absence of right great toe: Secondary | ICD-10-CM

## 2022-07-07 DIAGNOSIS — R001 Bradycardia, unspecified: Secondary | ICD-10-CM | POA: Diagnosis not present

## 2022-07-07 DIAGNOSIS — Z87891 Personal history of nicotine dependence: Secondary | ICD-10-CM

## 2022-07-07 HISTORY — DX: Acquired absence of left foot: Z89.432

## 2022-07-07 HISTORY — DX: Tobacco use: Z72.0

## 2022-07-07 HISTORY — DX: Acquired absence of right foot: Z89.431

## 2022-07-07 LAB — COMPREHENSIVE METABOLIC PANEL
ALT: 21 U/L (ref 0–44)
AST: 24 U/L (ref 15–41)
Albumin: 3 g/dL — ABNORMAL LOW (ref 3.5–5.0)
Alkaline Phosphatase: 59 U/L (ref 38–126)
Anion gap: 11 (ref 5–15)
BUN: 56 mg/dL — ABNORMAL HIGH (ref 6–20)
CO2: 25 mmol/L (ref 22–32)
Calcium: 8.9 mg/dL (ref 8.9–10.3)
Chloride: 100 mmol/L (ref 98–111)
Creatinine, Ser: 10.5 mg/dL — ABNORMAL HIGH (ref 0.44–1.00)
GFR, Estimated: 4 mL/min — ABNORMAL LOW (ref >60)
Glucose, Bld: 129 mg/dL — ABNORMAL HIGH (ref 70–99)
Potassium: 3.3 mmol/L — ABNORMAL LOW (ref 3.5–5.1)
Sodium: 136 mmol/L (ref 135–145)
Total Bilirubin: 1.2 mg/dL (ref 0.3–1.2)
Total Protein: 6.1 g/dL — ABNORMAL LOW (ref 6.5–8.1)

## 2022-07-07 LAB — BASIC METABOLIC PANEL
Anion gap: 14 (ref 5–15)
BUN: 37 mg/dL — ABNORMAL HIGH (ref 6–20)
CO2: 27 mmol/L (ref 22–32)
Calcium: 9.1 mg/dL (ref 8.9–10.3)
Chloride: 97 mmol/L — ABNORMAL LOW (ref 98–111)
Creatinine, Ser: 7.34 mg/dL — ABNORMAL HIGH (ref 0.44–1.00)
GFR, Estimated: 7 mL/min — ABNORMAL LOW (ref >60)
Glucose, Bld: 123 mg/dL — ABNORMAL HIGH (ref 70–99)
Potassium: 3.7 mmol/L (ref 3.5–5.1)
Sodium: 138 mmol/L (ref 135–145)

## 2022-07-07 LAB — CBC WITH DIFFERENTIAL/PLATELET
Abs Immature Granulocytes: 0.04 10*3/uL (ref 0.00–0.07)
Abs Immature Granulocytes: 0.04 10*3/uL (ref 0.00–0.07)
Basophils Absolute: 0 10*3/uL (ref 0.0–0.1)
Basophils Absolute: 0 10*3/uL (ref 0.0–0.1)
Basophils Relative: 0 %
Basophils Relative: 0 %
Eosinophils Absolute: 0.1 10*3/uL (ref 0.0–0.5)
Eosinophils Absolute: 0.1 10*3/uL (ref 0.0–0.5)
Eosinophils Relative: 1 %
Eosinophils Relative: 1 %
HCT: 19.5 % — ABNORMAL LOW (ref 36.0–46.0)
HCT: 21.4 % — ABNORMAL LOW (ref 36.0–46.0)
Hemoglobin: 6 g/dL — CL (ref 12.0–15.0)
Hemoglobin: 6.7 g/dL — CL (ref 12.0–15.0)
Immature Granulocytes: 1 %
Immature Granulocytes: 1 %
Lymphocytes Relative: 4 %
Lymphocytes Relative: 2 %
Lymphs Abs: 0.3 10*3/uL — ABNORMAL LOW (ref 0.7–4.0)
Lymphs Abs: 0.2 10*3/uL — ABNORMAL LOW (ref 0.7–4.0)
MCH: 28 pg (ref 26.0–34.0)
MCH: 28.4 pg (ref 26.0–34.0)
MCHC: 30.8 g/dL (ref 30.0–36.0)
MCHC: 31.3 g/dL (ref 30.0–36.0)
MCV: 91.1 fL (ref 80.0–100.0)
MCV: 90.7 fL (ref 80.0–100.0)
Monocytes Absolute: 0.5 10*3/uL (ref 0.1–1.0)
Monocytes Absolute: 0.4 10*3/uL (ref 0.1–1.0)
Monocytes Relative: 7 %
Monocytes Relative: 5 %
Neutro Abs: 5.9 10*3/uL (ref 1.7–7.7)
Neutro Abs: 7.6 10*3/uL (ref 1.7–7.7)
Neutrophils Relative %: 87 %
Neutrophils Relative %: 91 %
Platelets: 103 10*3/uL — ABNORMAL LOW (ref 150–400)
Platelets: 125 10*3/uL — ABNORMAL LOW (ref 150–400)
RBC: 2.14 MIL/uL — ABNORMAL LOW (ref 3.87–5.11)
RBC: 2.36 MIL/uL — ABNORMAL LOW (ref 3.87–5.11)
RDW: 19.5 % — ABNORMAL HIGH (ref 11.5–15.5)
RDW: 19.5 % — ABNORMAL HIGH (ref 11.5–15.5)
WBC: 6.7 10*3/uL (ref 4.0–10.5)
WBC: 8.4 10*3/uL (ref 4.0–10.5)
nRBC: 0 % (ref 0.0–0.2)
nRBC: 0 % (ref 0.0–0.2)

## 2022-07-07 LAB — HEPATITIS B SURFACE ANTIGEN: Hepatitis B Surface Ag: NONREACTIVE

## 2022-07-07 LAB — MAGNESIUM: Magnesium: 2.1 mg/dL (ref 1.7–2.4)

## 2022-07-07 LAB — HEPATITIS B CORE ANTIBODY, TOTAL: Hep B Core Total Ab: NONREACTIVE

## 2022-07-07 LAB — LIPASE, BLOOD: Lipase: 18 U/L (ref 11–51)

## 2022-07-07 LAB — HEPATITIS C ANTIBODY: HCV Ab: NONREACTIVE

## 2022-07-07 LAB — HEPATITIS B SURFACE ANTIBODY,QUALITATIVE: Hep B S Ab: NONREACTIVE

## 2022-07-07 LAB — BRAIN NATRIURETIC PEPTIDE: B Natriuretic Peptide: 2010 pg/mL — ABNORMAL HIGH (ref 0.0–100.0)

## 2022-07-07 LAB — OCCULT BLOOD X 1 CARD TO LAB, STOOL: Fecal Occult Bld: NEGATIVE

## 2022-07-07 MED ORDER — CHLORHEXIDINE GLUCONATE CLOTH 2 % EX PADS
6.0000 | MEDICATED_PAD | Freq: Every day | CUTANEOUS | Status: DC
  Administered 2022-07-08 – 2022-07-19 (×8): 6 via TOPICAL

## 2022-07-07 MED ORDER — FENTANYL CITRATE PF 50 MCG/ML IJ SOSY
50.0000 ug | PREFILLED_SYRINGE | Freq: Once | INTRAMUSCULAR | Status: AC
  Administered 2022-07-07: 50 ug via INTRAVENOUS
  Filled 2022-07-07: qty 1

## 2022-07-07 MED ORDER — CHLORHEXIDINE GLUCONATE CLOTH 2 % EX PADS
6.0000 | MEDICATED_PAD | Freq: Every day | CUTANEOUS | Status: DC
  Administered 2022-07-09 – 2022-07-15 (×2): 6 via TOPICAL

## 2022-07-07 MED ORDER — IOHEXOL 300 MG/ML  SOLN
100.0000 mL | Freq: Once | INTRAMUSCULAR | Status: AC | PRN
  Administered 2022-07-07: 100 mL via INTRAVENOUS

## 2022-07-07 NOTE — ED Triage Notes (Signed)
Pt BIB [facility]. Pt reports vaginal/lower abdominal pain that started on Tuesday. Pt reports she was prescribed an antibiotic for UTI but has been unable to get prescription filled.

## 2022-07-07 NOTE — ED Provider Notes (Signed)
Care assumed from [facility] at shift change, please see their note for full detail, but in brief Rebekah Burton is a 46 y.o. female presents with abdominal pain.  Plan:  Physical Exam  BP (!) 120/104 (BP Location: Right Arm)   Pulse (!) 105   Temp 98.1 F (36.7 C) (Oral)   Resp (!) 22   SpO2 100%   Physical Exam  Procedures  Procedures  ED Course / MDM    Medical Decision Making Amount and/or Complexity of Data Reviewed Labs: ordered. Radiology: ordered.  Risk Prescription drug management.   Patient was in dialysis at time of shift change.  Plan at time of handoff was to admit for after dialysis for hemoglobin of 6.0 and possible SBO as seen on CT scan.  I obtained fecal occult card to assess for GI bleed.  I spoke with Dr. Josephine Cables who believes that patient requires more work-up before admission.  Since she has had dialysis, he would like to repeat BMP and CBC to determine any changes in hemoglobin.  He also recommends obtaining type and screen and sending in fecal occult card.  Once BMP returns, he advises rectifying any hypokalemia.  Once these have returned, he recommends re-entering hospitalist consult for admission after that.  Pt care handed off to Bero MD at shift change who will monitor results and call for admission when they return.       Tonye Pearson, [place] 07/07/22 2307    Sherwood Gambler, MD 07/11/22 [phone]

## 2022-07-07 NOTE — Progress Notes (Signed)
Received patient in bed, alert and oriented. Informed consent signed and in chart.  Time tx completed:2230  HD treatment completed. Patient tolerated well. Fistula/Graft/HD catheter without signs and symptoms of complications. Patient transported back to the room, alert and orient and in no acute distress. Report given to bedside RN.  Total UF removed:2500 ml  Medication given:none  Post HD VS:168/89  Post HD weight: 98.2kg

## 2022-07-07 NOTE — ED Notes (Signed)
Pt transported to Dialysis ?

## 2022-07-07 NOTE — ED Provider Notes (Signed)
It will [facility] Provider Note   CSN: [id number] Arrival date & time: 07/07/22  1031     History  Chief Complaint  Patient presents with   Abdominal Pain    Rebekah Burton is a 46 y.o. female with hx of HTN, ESRD on dialysis, PAD, GERD, bilateral blindness, s/p transmetatarsal amputation of bilateral feet, and known superior vena cava deformity.  Presents today for lower abdominal pain/pelvic pain since Tuesday afternoon.  Saw her PCP, who was going to start her on an antibiotic for UTI.  Does not make urine, unable to discern urinary symptoms.  Medications are received via mail order, so she has not received this yet.  Unable to go to dialysis yesterday due to the pain.  Pressure or palpation worsens the pain.  Does not know what makes it feel better.  Hx of total hysterectomy.  Denies changes in bowel habits, N/V, flank pain, neck pain or stiffness, fevers, chills, dizziness, headache, lightheadedness.  Denies possibility of STD, no recent intercourse in the past few years.  Also complaining of mild shortness of breath since Tuesday, and increased bleeding times at her dialysis treatment due to Eliquis.  Was started on this from her recent ED visit 06/17/2022 when she had swelling to the upper extremity believed to be due to a known superior vena cava deformity.  DVT studies were negative at the time, was started on Eliquis for minimum of 3 months.  When she feels short of breath at dialysis, usually placed on 2L O2 and feels better.  Also feels overloaded with fluid in areas such as her legs and arms.  The history is provided by the patient and medical records.  Abdominal Pain Associated symptoms: shortness of breath        Home Medications Prior to Admission medications   Medication Sig Start Date End Date Taking? Authorizing Provider  b complex-vitamin c-folic acid (NEPHRO-VITE) 0.8 MG TABS tablet Take 1 tablet by mouth every Monday, Wednesday, and Friday with  hemodialysis. 03/17/21  Yes Samella Parr, NP  carvedilol (COREG) 25 MG tablet Take 1 tablet (25 mg total) by mouth 2 (two) times daily. 03/17/21  Yes Samella Parr, NP  cloNIDine (CATAPRES) 0.3 MG tablet Take 1 tablet (0.3 mg total) by mouth 3 (three) times daily. 03/17/21  Yes Samella Parr, NP  ibuprofen (ADVIL) 600 MG tablet Take 1 tablet (600 mg total) by mouth 3 (three) times daily. Patient taking differently: Take 600 mg by mouth every 8 (eight) hours as needed (pain). 03/17/21  Yes Samella Parr, NP  lanthanum (FOSRENOL) 1000 MG chewable tablet Chew 1 tablet (1,000 mg total) by mouth 3 (three) times daily with meals. 03/18/21  Yes Samella Parr, NP  pantoprazole (PROTONIX) 40 MG tablet Take 1 tablet (40 mg total) by mouth daily. 03/17/21  Yes Samella Parr, NP  senna (SENOKOT) 8.6 MG TABS tablet Take 1 tablet (8.6 mg total) by mouth 2 (two) times daily. 03/17/21  Yes Samella Parr, NP  traZODone (DESYREL) 150 MG tablet Take 150 mg by mouth at bedtime. 05/01/21  Yes [provider]  APIXABAN Arne Cleveland) VTE STARTER PACK (10MG  AND 5MG ) Take as directed on package: start with two-5mg  tablets twice daily for 7 days. On day 8, switch to one-5mg  tablet twice daily. Patient not taking: Reported on 07/07/2022 06/18/22   Little Ishikawa, MD      Allergies    Patient has no known allergies.  Review of Systems   Review of Systems  Respiratory:  Positive for shortness of breath.   Gastrointestinal:  Positive for abdominal pain.    Physical Exam Updated Vital Signs BP 98/73   Pulse 75   Temp 98.4 F (36.9 C) (Oral) Comment: Simultaneous filing. User may not have seen previous data. Comment (Src): Simultaneous filing. User may not have seen previous data.  Resp 16   SpO2 100%  Physical Exam Vitals and nursing note reviewed. Exam conducted with a chaperone present.  Constitutional:      General: She is not in acute distress.    Appearance: She is well-developed. She  is not ill-appearing, toxic-appearing or diaphoretic.  HENT:     Head: Normocephalic and atraumatic.  Eyes:     General: No scleral icterus.    Conjunctiva/sclera: Conjunctivae normal.  Cardiovascular:     Rate and Rhythm: Normal rate and regular rhythm.     Heart sounds: No murmur heard.    Comments: AV fistula of left arm Pulmonary:     Effort: Pulmonary effort is normal. No respiratory distress.     Breath sounds: Rhonchi present.     Comments: Right worse than left Chest:     Chest wall: No tenderness.  Abdominal:     General: Abdomen is protuberant. Bowel sounds are normal.     Palpations: Abdomen is soft. There is shifting dullness.     Tenderness: There is generalized abdominal tenderness. There is no right CVA tenderness or left CVA tenderness. Negative signs include McBurney's sign.  Genitourinary:    Exam position: Lithotomy position.     Vagina: No bleeding.     Comments: Very mild white clear vulvar discharge.  No blood visualized from the vagina or the rectum. Musculoskeletal:        General: No swelling.     Cervical back: Neck supple.     Right lower leg: 2+ Edema present.     Left lower leg: 2+ Edema present.  Skin:    General: Skin is warm and dry.     Capillary Refill: Capillary refill takes less than 2 seconds.     Coloration: Skin is not cyanotic, jaundiced or pale.  Neurological:     Mental Status: She is alert and oriented to person, place, and time.  Psychiatric:        Mood and Affect: Mood normal.     ED Results / Procedures / Treatments   Labs (all labs ordered are listed, but only abnormal results are displayed) Labs Reviewed  BRAIN NATRIURETIC PEPTIDE - Abnormal; Notable for the following components:      Result Value   B Natriuretic Peptide 2,010.0 (*)    All other components within normal limits  COMPREHENSIVE METABOLIC PANEL - Abnormal; Notable for the following components:   Potassium 3.3 (*)    Glucose, Bld 129 (*)    BUN 56 (*)     Creatinine, Ser 10.50 (*)    Total Protein 6.1 (*)    Albumin 3.0 (*)    GFR, Estimated 4 (*)    All other components within normal limits  CBC WITH DIFFERENTIAL/PLATELET - Abnormal; Notable for the following components:   RBC 2.14 (*)    Hemoglobin 6.0 (*)    HCT 19.5 (*)    RDW 19.5 (*)    Platelets 103 (*)    Lymphs Abs 0.3 (*)    All other components within normal limits  LIPASE, BLOOD  HEPATITIS B SURFACE ANTIGEN  HEPATITIS B CORE ANTIBODY, TOTAL  HEPATITIS B SURFACE ANTIBODY,QUALITATIVE  HEPATITIS B SURFACE ANTIBODY, QUANTITATIVE  HEPATITIS C ANTIBODY    EKG EKG Interpretation  Date/Time:  Thursday July 07 2022 12:47:31 EDT Ventricular Rate:  75 PR Interval:  140 QRS Duration: 61 QT Interval:  449 QTC Calculation: 502 R Axis:   107 Text Interpretation: Sinus rhythm Right axis deviation Low voltage, precordial leads Borderline prolonged QT interval nonspecific T wave flattening from prior Confirmed by Aletta Edouard [phone]) on 07/07/2022 12:53:07 PM  Radiology US Pelvis Complete  Result Date: 07/07/2022 CLINICAL DATA:  Left lower quadrant pain. Focal abnormality seen on CT. EXAM: TRANSABDOMINAL AND TRANSVAGINAL ULTRASOUND OF PELVIS DOPPLER ULTRASOUND OF OVARIES TECHNIQUE: Both transabdominal and transvaginal ultrasound examinations of the pelvis were performed. Transabdominal technique was performed for global imaging of the pelvis including uterus, ovaries, adnexal regions, and pelvic cul-de-sac. It was necessary to proceed with endovaginal exam following the transabdominal exam to visualize the ovaries/adnexa. Color and duplex Doppler ultrasound was utilized to evaluate blood flow to the ovaries. COMPARISON:  CT today FINDINGS: Uterus Measurements: Prior hysterectomy Endometrium Thickness: Prior hysterectomy. Right ovary Measurements: Not visualized.  No adnexal mass seen. Left ovary Measurements: 3.1 x 1.9 x 2.1 cm = volume: 6.5 mL. Small follicles noted within the left  ovary. Color flow visualized within the left ovary. Pulsed Doppler evaluation of the left ovary demonstrates color flow visualized. Venous flow is also documented. Unable to document/visualize arterial flow. Other findings Large amount of ascites. IMPRESSION: Left ovary is visualized. No ovarian/adnexal mass. Color flow and venous waveforms documented in the left ovary. Unable to document arterial waveform, but torsion unlikely given the venous and color flow noted. Prior hysterectomy. Large volume ascites. Electronically Signed   By: Rolm Baptise M.D.   On: 07/07/2022 18:21   US Transvaginal Non-OB  Result Date: 07/07/2022 CLINICAL DATA:  Left lower quadrant pain. Focal abnormality seen on CT. EXAM: TRANSABDOMINAL AND TRANSVAGINAL ULTRASOUND OF PELVIS DOPPLER ULTRASOUND OF OVARIES TECHNIQUE: Both transabdominal and transvaginal ultrasound examinations of the pelvis were performed. Transabdominal technique was performed for global imaging of the pelvis including uterus, ovaries, adnexal regions, and pelvic cul-de-sac. It was necessary to proceed with endovaginal exam following the transabdominal exam to visualize the ovaries/adnexa. Color and duplex Doppler ultrasound was utilized to evaluate blood flow to the ovaries. COMPARISON:  CT today FINDINGS: Uterus Measurements: Prior hysterectomy Endometrium Thickness: Prior hysterectomy. Right ovary Measurements: Not visualized.  No adnexal mass seen. Left ovary Measurements: 3.1 x 1.9 x 2.1 cm = volume: 6.5 mL. Small follicles noted within the left ovary. Color flow visualized within the left ovary. Pulsed Doppler evaluation of the left ovary demonstrates color flow visualized. Venous flow is also documented. Unable to document/visualize arterial flow. Other findings Large amount of ascites. IMPRESSION: Left ovary is visualized. No ovarian/adnexal mass. Color flow and venous waveforms documented in the left ovary. Unable to document arterial waveform, but torsion  unlikely given the venous and color flow noted. Prior hysterectomy. Large volume ascites. Electronically Signed   By: Rolm Baptise M.D.   On: 07/07/2022 18:21   [place] Art/Ven Flow Abd Pelv Doppler  Result Date: 07/07/2022 CLINICAL DATA:  Left lower quadrant pain. Focal abnormality seen on CT. EXAM: TRANSABDOMINAL AND TRANSVAGINAL ULTRASOUND OF PELVIS DOPPLER ULTRASOUND OF OVARIES TECHNIQUE: Both transabdominal and transvaginal ultrasound examinations of the pelvis were performed. Transabdominal technique was performed for global imaging of the pelvis including uterus, ovaries, adnexal regions, and pelvic cul-de-sac. It was necessary  to proceed with endovaginal exam following the transabdominal exam to visualize the ovaries/adnexa. Color and duplex Doppler ultrasound was utilized to evaluate blood flow to the ovaries. COMPARISON:  CT today FINDINGS: Uterus Measurements: Prior hysterectomy Endometrium Thickness: Prior hysterectomy. Right ovary Measurements: Not visualized.  No adnexal mass seen. Left ovary Measurements: 3.1 x 1.9 x 2.1 cm = volume: 6.5 mL. Small follicles noted within the left ovary. Color flow visualized within the left ovary. Pulsed Doppler evaluation of the left ovary demonstrates color flow visualized. Venous flow is also documented. Unable to document/visualize arterial flow. Other findings Large amount of ascites. IMPRESSION: Left ovary is visualized. No ovarian/adnexal mass. Color flow and venous waveforms documented in the left ovary. Unable to document arterial waveform, but torsion unlikely given the venous and color flow noted. Prior hysterectomy. Large volume ascites. Electronically Signed   By: Rolm Baptise M.D.   On: 07/07/2022 18:21   CT Abdomen Pelvis W Contrast  Result Date: 07/07/2022 CLINICAL DATA:  Abdominal pain EXAM: CT ABDOMEN AND PELVIS WITH CONTRAST TECHNIQUE: Multidetector CT imaging of the abdomen and pelvis was performed using the standard protocol following bolus  administration of intravenous contrast. RADIATION DOSE REDUCTION: This exam was performed according to the departmental dose-optimization program which includes automated exposure control, adjustment of the mA and/or kV according to patient size and/or use of iterative reconstruction technique. CONTRAST:  150mL OMNIPAQUE IOHEXOL 300 MG/ML  SOLN COMPARISON:  Chest CT dated June 17, 2022 FINDINGS: Lower chest: Bibasilar atelectasis. Small right pleural effusion. Cardiomegaly. Coronary artery calcifications. Hepatobiliary: No focal liver abnormality is seen. Gallstones with no gallbladder wall thickening. No biliary ductal dilation. Pancreas: No pancreatic ductal dilation. Cystic lesion of the tail of the pancreas measuring 17 mm on series 2, image 22. Spleen: Normal in size without focal abnormality. Adrenals/Urinary Tract: Bilateral adrenal glands are unremarkable. No hydronephrosis. Heterogeneous area of the interpolar region of the left kidney. Bilateral low-attenuation renal lesions, largest are compatible with simple cysts, others are too small to completely characterize. Bladder is decompressed. Stomach/Bowel: Normal appearance of the stomach. Mildly dilated loops of distal small bowel with no definite transition point. No bowel wall thickening or inflammatory change. Normal appendix. Vascular/Lymphatic: Severe aortic atherosclerotic disease. 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 Occlusion of the partially visualized bilateral superficial femoral arteries. Pathologically enlarged lymph nodes seen in the abdomen or pelvis. Reproductive: Uterus is unremarkable. Indeterminate enhancing focus of the left pelvis measuring 2.2 cm on series 2, image 65. Other: Diffuse soft tissue anasarca. Numerous varices of the anterior abdominal wall. Small volume abdominal ascites. Musculoskeletal: Diffuse osseous sclerosis,  likely sequela of chronic renal disease. IMPRESSION: 1. Numerous mildly dilated loops of small bowel with no definite transition point, concerning for early or partial small bowel obstruction. 2. Focal heterogeneous area of the interpolar region of the left kidney, possibly due to infection, although renal mass can not be excluded. Correlate with urinalysis and recommend follow-up renal protocol CT in 3 months to ensure resolution. 3. Small right pleural effusion and small volume abdominal ascites. 4. Cystic lesion of the tail of the pancreas measuring 17 mm. Recommend nonemergent contrast-enhanced MRCP for further evaluation. 5. Indeterminate enhancing focus of the left pelvis measuring 2.2 cm, likely the left ovary, although evaluation is limited due to abdominal ascites. Finding could be further evaluated with pelvic ultrasound. 6. Severe aortic Atherosclerosis (ICD10-I70.0). Occlusion of the proximal SMA with  distal reconstitution of flow, similar to prior exam. Occlusion of the partially visualized bilateral superficial femoral arteries. 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 Electronically Signed   By: Yetta Glassman M.D.   On: 07/07/2022 16:20   DG Chest 2 View  Result Date: 07/07/2022 CLINICAL DATA:  Shortness of breath. EXAM: CHEST - 2 VIEW COMPARISON:  Chest x-ray May 04, 2021. FINDINGS: Enlarged cardiac silhouette . Pulmonary vascular congestion. Suspected mild pulmonary edema. No confluent consolidation. No definite pleural effusions or visible pneumothorax. IMPRESSION: Cardiomegaly, pulmonary vascular congestion suspected mild pulmonary edema. Electronically Signed   By: Margaretha Sheffield M.D.   On: 07/07/2022 11:28    Procedures Procedures    Medications Ordered in ED Medications  Chlorhexidine Gluconate Cloth 2 % PADS 6 each (has no administration in time range)  Chlorhexidine Gluconate Cloth 2 % PADS 6 each (has no administration in time range)  fentaNYL  (SUBLIMAZE) injection 50 mcg (has no administration in time range)  fentaNYL (SUBLIMAZE) injection 50 mcg (50 mcg Intravenous Given 07/07/22 1518)  iohexol (OMNIPAQUE) 300 MG/ML solution 100 mL (100 mLs Intravenous Contrast Given 07/07/22 1540)    ED Course/ Medical Decision Making/ A&P                           Medical Decision Making Amount and/or Complexity of Data Reviewed Labs: ordered. Radiology: ordered.  Risk Prescription drug management.   46 y.o. female presents to the ED for concern of Abdominal Pain   This involves an extensive number of treatment options, and is a complaint that carries with it a high risk of complications and morbidity.     Past Medical History / Co-morbidities / Social History: Hx of HTN, ESRD on dialysis, PAD, GERD, bilateral blindness, s/p transmetatarsal amputation of left foot, and known superior vena cava deformity Social Determinants of Health include tobacco use disorder and poor social situation, for which resources were provided.  Additional History:  Internal and external records from outside source obtained and reviewed including ED visits, which documented recent regarding SVC syndrome and possible DVT of upper extremity.  Was placed on Eliquis and is being monitored over the next 3 months.  Lab Tests: I ordered, and personally interpreted labs.  The pertinent results include:   CBC: Hgb 6.0, HCT 19.5 CMP/BMP: Creatinine 10.50, BUN 56, GFR 4, potassium 3.3 BNP 2010.0 Lipase 18 Wet prep: Pending  Imaging Studies: I ordered imaging studies including CXR, CT abdomen and pelvis.   I independently visualized and interpreted imaging which showed: CXR: cardiomegaly, pulmonary edema, vascular congestion CT abdomen pelvis:  1. Numerous mildly dilated loops of small bowel with no definite transition point, concerning for early or partial SBO 2. Focal heterogeneous area of the interpolar region of the left kidney, possibly due to infection,  although renal mass can not be excluded. Correlate with UA and recommend follow-up renal protocol CT in 3 months to ensure resolution.  3. Small right pleural effusion and small volume abdominal ascites.  4. Cystic lesion of the tail of the pancreas 17 mm.  5. Indeterminate enhancing focus of the left pelvis measuring 2.2 cm, likely left ovary, eval limited due to abdominal ascites. ...recommend further eval with pelvic US  6. Severe aortic Atherosclerosis. 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 Occlusion of the partially visualized bilateral superficial femoral arteries. 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  Unable to document arterial waveform, but torsion unlikely given the venous and color flow noted. Prior hysterectomy. Large volume ascites I agree with the radiologist interpretation.  ED Course: Pt mildly ill-appearing on exam.  Clinically blind.  Nonseptic, nontoxic appearing in NAD complaining of lower generalized abdominal pain and feeling fluid overloaded.  Missed dialysis yesterday due to tenderness, worsened with palpation.  Does not produce urine, difficult to assess for UTI.  States PCP planned to send an antibiotic for UTI, but has not started/received it yet.  No fevers, chills, dizziness, N/V/D, or lightheadedness.  Endorses some shortness of breath but no chest pain.  2 L O2 provided, patient noticed some improvement.  No neck stiffness or meningismus, low suspicion for meningitis.  Still able to pass stool.  Afebrile.  Hx of hysterectomy, without uterus or ovaries per patient, unable to corroborate with surgical records.  Low initial suspicion of TOA, torsion, or ovarian cyst.  No recent sexual contact in the last 2 years, lower suspicion of STD.  Does not menstruate.  Without vaginal or rectal bleeding.  Clinical exam significant for nonpitting edema of lower  extremities, rales, and oxygen saturation of 92% on room air.  Plan to consult nephrology regarding dialysis. I consulted with Dr. Candiss Norse of Nephrology, and discussed lab and imaging findings as well as pertinent plan - they recommend: Setting up dialysis for patient today.  I also discussed the plan to proceed with CT contrast imaging of abdomen and pelvis, nephrology is aware.  They are also prepared to follow should she be admitted. I personally reviewed labs, which indicated progressive anemia with Hgb of 6.0 and HCT 19.5.  Creatinine also elevated 10.50 with BUN of 56 and GFR for.  BNP also elevated 2,010.0 which appears much higher than her normal, closer to 1000-1300.  Unsure of BNP specificity due to dialysis dependence.  CT abdomen and pelvis pending.  Anticipate likely admission for anemia and fluid overload at this time. 1420: I consulted with Dr. Wyline Copas of Hospitalist group, and discussed lab and imaging findings as well as pertinent plan - they recommend: awaiting CT results, then revisit plan for admission 1640: CT imaging indicates possible SBO with ascites, pleural effusion, and masses of left kidney, pancreas tail, and left ovary of unknown etiology.  Recommended further imaging of left ovary due to obstruction from ascites.  US pelvic series ordered.  Chaperone present during GU exam.  Mild white vulvar discharge visualized however no blood appreciated of vagina or rectum.  Low probability of large GI or vaginal hemorrhage. Upon re-evaluation, pt is unchanged.  Still with pain.  More fentanyl ordered.   US pelvic results pending. Pelvic US resulted.  No evidence of mass, and low suspicion for torsion.  Clinical picture does not suggest TOA or ovarian cyst.  Unknown cause of anemia, no obvious evidence of blood loss.  Requested hospitalist consultation for admission. 1841: Taken to dialysis.  Hospitalist consultation still pending.  Disposition: 1915: care of Rebekah Burton transferred to [place]  [facility] and Dr. Regenia Skeeter at the end of my shift .  Patient case discussed in depth.  Please see his/her note for further details.  Plan at time of handoff is to finish dialysis and consult with hospitalist about admission.  This may be altered or completely changed at the discretion of the oncoming team pending results of further workup.  I discussed this case with my attending physician Dr. Melina Copa, who agreed  with the proposed treatment course and cosigned this note including patient's presenting symptoms, physical exam, and planned diagnostics and interventions.  Attending physician stated agreement with plan or made changes to plan which were implemented.     This chart was dictated using voice recognition software.  Despite best efforts to proofread, errors can occur which can change the documentation meaning.         Final Clinical Impression(s) / ED Diagnoses Final diagnoses:  ESRD (end stage renal disease) ([place])  Anemia, unspecified type  Elevated brain natriuretic peptide (BNP) level  Acute pulmonary edema [facility])    Rx / DC Orders ED Discharge Orders     None         Candace Cruise 89/84/21 2312    Hayden Rasmussen, MD 07/08/22 1914

## 2022-07-08 ENCOUNTER — Inpatient Hospital Stay (HOSPITAL_COMMUNITY): Payer: Medicare HMO

## 2022-07-08 ENCOUNTER — Encounter (HOSPITAL_COMMUNITY): Payer: Self-pay | Admitting: Internal Medicine

## 2022-07-08 DIAGNOSIS — R188 Other ascites: Secondary | ICD-10-CM | POA: Diagnosis present

## 2022-07-08 DIAGNOSIS — Z8616 Personal history of COVID-19: Secondary | ICD-10-CM | POA: Diagnosis not present

## 2022-07-08 DIAGNOSIS — Y832 Surgical operation with anastomosis, bypass or graft as the cause of abnormal reaction of the patient, or of later complication, without mention of misadventure at the time of the procedure: Secondary | ICD-10-CM | POA: Diagnosis present

## 2022-07-08 DIAGNOSIS — I10 Essential (primary) hypertension: Secondary | ICD-10-CM

## 2022-07-08 DIAGNOSIS — J9601 Acute respiratory failure with hypoxia: Secondary | ICD-10-CM | POA: Diagnosis not present

## 2022-07-08 DIAGNOSIS — I700 Atherosclerosis of aorta: Secondary | ICD-10-CM | POA: Diagnosis present

## 2022-07-08 DIAGNOSIS — N185 Chronic kidney disease, stage 5: Secondary | ICD-10-CM | POA: Diagnosis not present

## 2022-07-08 DIAGNOSIS — E46 Unspecified protein-calorie malnutrition: Secondary | ICD-10-CM

## 2022-07-08 DIAGNOSIS — I97711 Intraoperative cardiac arrest during other surgery: Secondary | ICD-10-CM | POA: Diagnosis not present

## 2022-07-08 DIAGNOSIS — J810 Acute pulmonary edema: Secondary | ICD-10-CM | POA: Diagnosis present

## 2022-07-08 DIAGNOSIS — I469 Cardiac arrest, cause unspecified: Secondary | ICD-10-CM | POA: Diagnosis not present

## 2022-07-08 DIAGNOSIS — D649 Anemia, unspecified: Secondary | ICD-10-CM | POA: Diagnosis not present

## 2022-07-08 DIAGNOSIS — E876 Hypokalemia: Secondary | ICD-10-CM

## 2022-07-08 DIAGNOSIS — N10 Acute tubulo-interstitial nephritis: Secondary | ICD-10-CM | POA: Diagnosis present

## 2022-07-08 DIAGNOSIS — N186 End stage renal disease: Secondary | ICD-10-CM | POA: Diagnosis present

## 2022-07-08 DIAGNOSIS — I340 Nonrheumatic mitral (valve) insufficiency: Secondary | ICD-10-CM | POA: Diagnosis not present

## 2022-07-08 DIAGNOSIS — K56600 Partial intestinal obstruction, unspecified as to cause: Secondary | ICD-10-CM | POA: Diagnosis present

## 2022-07-08 DIAGNOSIS — H548 Legal blindness, as defined in USA: Secondary | ICD-10-CM | POA: Diagnosis present

## 2022-07-08 DIAGNOSIS — I120 Hypertensive chronic kidney disease with stage 5 chronic kidney disease or end stage renal disease: Secondary | ICD-10-CM | POA: Diagnosis present

## 2022-07-08 DIAGNOSIS — K219 Gastro-esophageal reflux disease without esophagitis: Secondary | ICD-10-CM | POA: Diagnosis present

## 2022-07-08 DIAGNOSIS — D62 Acute posthemorrhagic anemia: Secondary | ICD-10-CM

## 2022-07-08 DIAGNOSIS — K862 Cyst of pancreas: Secondary | ICD-10-CM | POA: Diagnosis present

## 2022-07-08 DIAGNOSIS — I3139 Other pericardial effusion (noninflammatory): Secondary | ICD-10-CM | POA: Diagnosis not present

## 2022-07-08 DIAGNOSIS — N2581 Secondary hyperparathyroidism of renal origin: Secondary | ICD-10-CM | POA: Diagnosis present

## 2022-07-08 DIAGNOSIS — T82898A Other specified complication of vascular prosthetic devices, implants and grafts, initial encounter: Secondary | ICD-10-CM | POA: Diagnosis not present

## 2022-07-08 DIAGNOSIS — T827XXA Infection and inflammatory reaction due to other cardiac and vascular devices, implants and grafts, initial encounter: Secondary | ICD-10-CM | POA: Diagnosis present

## 2022-07-08 DIAGNOSIS — E1122 Type 2 diabetes mellitus with diabetic chronic kidney disease: Secondary | ICD-10-CM | POA: Diagnosis not present

## 2022-07-08 DIAGNOSIS — D631 Anemia in chronic kidney disease: Secondary | ICD-10-CM | POA: Diagnosis present

## 2022-07-08 DIAGNOSIS — I879 Disorder of vein, unspecified: Secondary | ICD-10-CM

## 2022-07-08 DIAGNOSIS — I739 Peripheral vascular disease, unspecified: Secondary | ICD-10-CM | POA: Diagnosis present

## 2022-07-08 DIAGNOSIS — F17200 Nicotine dependence, unspecified, uncomplicated: Secondary | ICD-10-CM

## 2022-07-08 DIAGNOSIS — A419 Sepsis, unspecified organism: Secondary | ICD-10-CM

## 2022-07-08 DIAGNOSIS — I342 Nonrheumatic mitral (valve) stenosis: Secondary | ICD-10-CM | POA: Diagnosis not present

## 2022-07-08 DIAGNOSIS — I2510 Atherosclerotic heart disease of native coronary artery without angina pectoris: Secondary | ICD-10-CM

## 2022-07-08 DIAGNOSIS — E8809 Other disorders of plasma-protein metabolism, not elsewhere classified: Secondary | ICD-10-CM

## 2022-07-08 DIAGNOSIS — I081 Rheumatic disorders of both mitral and tricuspid valves: Secondary | ICD-10-CM | POA: Diagnosis not present

## 2022-07-08 DIAGNOSIS — A4102 Sepsis due to Methicillin resistant Staphylococcus aureus: Secondary | ICD-10-CM | POA: Diagnosis present

## 2022-07-08 DIAGNOSIS — E669 Obesity, unspecified: Secondary | ICD-10-CM | POA: Diagnosis present

## 2022-07-08 DIAGNOSIS — I480 Paroxysmal atrial fibrillation: Secondary | ICD-10-CM | POA: Diagnosis not present

## 2022-07-08 DIAGNOSIS — Z992 Dependence on renal dialysis: Secondary | ICD-10-CM | POA: Diagnosis not present

## 2022-07-08 LAB — COMPREHENSIVE METABOLIC PANEL
ALT: 18 U/L (ref 0–44)
AST: 17 U/L (ref 15–41)
Albumin: 2.8 g/dL — ABNORMAL LOW (ref 3.5–5.0)
Alkaline Phosphatase: 55 U/L (ref 38–126)
Anion gap: 13 (ref 5–15)
BUN: 44 mg/dL — ABNORMAL HIGH (ref 6–20)
CO2: 27 mmol/L (ref 22–32)
Calcium: 8.9 mg/dL (ref 8.9–10.3)
Chloride: 96 mmol/L — ABNORMAL LOW (ref 98–111)
Creatinine, Ser: 8.2 mg/dL — ABNORMAL HIGH (ref 0.44–1.00)
GFR, Estimated: 6 mL/min — ABNORMAL LOW (ref >60)
Glucose, Bld: 136 mg/dL — ABNORMAL HIGH (ref 70–99)
Potassium: 3.9 mmol/L (ref 3.5–5.1)
Sodium: 136 mmol/L (ref 135–145)
Total Bilirubin: 1.4 mg/dL — ABNORMAL HIGH (ref 0.3–1.2)
Total Protein: 5.9 g/dL — ABNORMAL LOW (ref 6.5–8.1)

## 2022-07-08 LAB — ECHOCARDIOGRAM COMPLETE
AR max vel: 2.15 cm2
AV Area VTI: 2.3 cm2
AV Area mean vel: 2.51 cm2
AV Mean grad: 7.5 mmHg
AV Peak grad: 17.5 mmHg
Ao pk vel: 2.09 m/s
Area-P 1/2: 2.83 cm2
Calc EF: 68.2 %
MV VTI: 2.02 cm2
S' Lateral: 2.2 cm
Single Plane A2C EF: 70.1 %
Single Plane A4C EF: 67.6 %

## 2022-07-08 LAB — CBC
HCT: 19.6 % — ABNORMAL LOW (ref 36.0–46.0)
Hemoglobin: 6.1 g/dL — CL (ref 12.0–15.0)
MCH: 28 pg (ref 26.0–34.0)
MCHC: 31.1 g/dL (ref 30.0–36.0)
MCV: 89.9 fL (ref 80.0–100.0)
Platelets: 97 10*3/uL — ABNORMAL LOW (ref 150–400)
RBC: 2.18 MIL/uL — ABNORMAL LOW (ref 3.87–5.11)
RDW: 18.5 % — ABNORMAL HIGH (ref 11.5–15.5)
WBC: 9.6 10*3/uL (ref 4.0–10.5)
nRBC: 0 % (ref 0.0–0.2)

## 2022-07-08 LAB — PHOSPHORUS: Phosphorus: 5.2 mg/dL — ABNORMAL HIGH (ref 2.5–4.6)

## 2022-07-08 LAB — MAGNESIUM: Magnesium: 1.9 mg/dL (ref 1.7–2.4)

## 2022-07-08 LAB — PREPARE RBC (CROSSMATCH)

## 2022-07-08 MED ORDER — PANTOPRAZOLE SODIUM 40 MG PO TBEC
40.0000 mg | DELAYED_RELEASE_TABLET | Freq: Every day | ORAL | Status: DC
  Administered 2022-07-08 – 2022-07-19 (×11): 40 mg via ORAL
  Filled 2022-07-08 (×11): qty 1

## 2022-07-08 MED ORDER — FENTANYL CITRATE PF 50 MCG/ML IJ SOSY
25.0000 ug | PREFILLED_SYRINGE | INTRAMUSCULAR | Status: DC | PRN
  Administered 2022-07-08 – 2022-07-12 (×21): 25 ug via INTRAVENOUS
  Filled 2022-07-08 (×22): qty 1

## 2022-07-08 MED ORDER — SODIUM CHLORIDE 0.9 % IV SOLN
1.0000 g | INTRAVENOUS | Status: DC
  Administered 2022-07-08 – 2022-07-10 (×3): 1 g via INTRAVENOUS
  Filled 2022-07-08 (×3): qty 10

## 2022-07-08 MED ORDER — SODIUM CHLORIDE 0.9% IV SOLUTION: Freq: Once | INTRAVENOUS | Status: AC

## 2022-07-08 MED ORDER — CINACALCET HCL 30 MG PO TABS
90.0000 mg | ORAL_TABLET | ORAL | Status: DC
  Administered 2022-07-11 – 2022-07-18 (×3): 90 mg via ORAL
  Filled 2022-07-08 (×3): qty 3

## 2022-07-08 MED ORDER — SODIUM CHLORIDE 0.9 % IV SOLN: 10.0000 mL/h | Freq: Once | INTRAVENOUS | Status: DC

## 2022-07-08 MED ORDER — ACETAMINOPHEN 325 MG PO TABS
650.0000 mg | ORAL_TABLET | Freq: Once | ORAL | Status: AC
  Administered 2022-07-08: 650 mg via ORAL
  Filled 2022-07-08: qty 2

## 2022-07-08 MED ORDER — ACETAMINOPHEN 650 MG RE SUPP: 650.0000 mg | Freq: Four times a day (QID) | RECTAL | Status: DC | PRN

## 2022-07-08 MED ORDER — ENSURE ENLIVE PO LIQD
237.0000 mL | Freq: Two times a day (BID) | ORAL | Status: DC
  Administered 2022-07-08: 237 mL via ORAL

## 2022-07-08 MED ORDER — CALCITRIOL 0.5 MCG PO CAPS
2.5000 ug | ORAL_CAPSULE | ORAL | Status: DC
  Administered 2022-07-13 – 2022-07-18 (×3): 2.5 ug via ORAL
  Filled 2022-07-08: qty 10
  Filled 2022-07-08 (×3): qty 5

## 2022-07-08 MED ORDER — POTASSIUM CHLORIDE CRYS ER 20 MEQ PO TBCR
20.0000 meq | EXTENDED_RELEASE_TABLET | Freq: Once | ORAL | Status: AC
  Administered 2022-07-08: 20 meq via ORAL
  Filled 2022-07-08: qty 1

## 2022-07-08 MED ORDER — ACETAMINOPHEN 325 MG PO TABS
650.0000 mg | ORAL_TABLET | Freq: Four times a day (QID) | ORAL | Status: DC | PRN
  Administered 2022-07-08 – 2022-07-13 (×5): 650 mg via ORAL
  Filled 2022-07-08 (×5): qty 2

## 2022-07-08 NOTE — Consult Note (Addendum)
ESRD Consult Note  Assessment/Recommendations:   ESRD:  -outpatient orders: [facility].  4 hours 15 minutes.  MWF.  EDW 102 kg.  Revaclear 300.  400/500.  2K, 2.5 Cal.  Meds: Calcitriol 2.5 mcg 3 times weekly, Sensipar 90 mg 3 times weekly, Mircera 200 mg every 2 weeks (last dose on Monday 7/10).  No heparin -s/p urgent HD 7/13 for hypervolemia -next HD tomorrow (off schedule), can resume MWF thereafter  Sepsis possible related to left pyelonephritis -abx per primary service, currently receiving rocephin  Abdominal pain, early/partial SBO -mgmt per primary service  Volume/ hypertension: EDW 102kg. Attempt to achieve EDW as tolerated  Anemia of Chronic Kidney Disease, acute anemia: Hemoglobin 6.7 last night. S/p PRBC  -FOBT negative, transfused for Hgb <7 -will check Fe panel -last dose of mircera per outpatient until was 7/10  Secondary Hyperparathyroidism/Hyperphosphatemia: will resumed calcitriol and sensipar, will start renvela (patient reports she was supposed to start this since lanthanum no longer covered by insurance)   Vascular access: has had issues with prolonged bleeding from AVF, currently seems to be okay. Will continue to hold heparin, if bleeding recurs, will need IR vs VVS to weigh in w/ f'gram  Additional recommendations: - Dose all meds for creatinine clearance < 10 ml/min  - Unless absolutely necessary, no MRIs with gadolinium.  - Implement save arm precautions.  Prefer needle sticks in the dorsum of the hands or wrists.  No blood pressure measurements in arm. - If blood transfusion is requested during hemodialysis sessions, please alert [place] prior to the session.  - If a hemodialysis catheter line culture is requested, please alert [place] as only hemodialysis nurses are able to collect those specimens.   Recommendations were discussed with the primary team.  Will be monitoring patient remotely over the weekend. Please call on-call nephrologist with any  questions/concerns.   History of Present Illness: Rebekah Burton is a/an 46 y.o. female with a past medical history of ESRD, hypertension, PAD, blindness, lateral transmetatarsal amputation, tobacco abuse who presents with abdominal pain which started Tuesday 7/11.  Weakness likely from fistula.  She did see her PCP and was started on antibiotic for possible UTI but had not received the medication.  She did miss her dialysis on Wednesday due to severity abdominal pain.  She did undergo CT abdomen pelvis with contrast which showed concerns for early/partial small bowel obstruction hydration in this area the interpolar region of the left kidney concern of infection therefore started on antibiotics.  I did receive a call from the ER on 7/13 for concerns for hypervolemia and she was urgently dialyzed that day.  She was found to have a hemoglobin of 6 on presentation and received 1 unit PRBC, follow-up hemoglobin was 6.7 therefore received another unit.  Her FOBT was negative on 7/13. Today, patient reports feeling better. Abdominal pain is not as bad. She does report issues with prolonged bleeding from AVF, has not been receiving heparin. Did okay with yesterday's treatment. She denies any fevers, chills, chest pain, SOB currently.   Medications:  Current Facility-Administered Medications  Medication Dose Route Frequency Provider Last Rate Last Admin   0.9 %  sodium chloride infusion  10 mL/hr Intravenous Once Adefeso, Oladapo, DO       acetaminophen (TYLENOL) tablet 650 mg  650 mg Oral Q6H PRN Adefeso, Oladapo, DO       Or   acetaminophen (TYLENOL) suppository 650 mg  650 mg Rectal Q6H PRN Adefeso, Oladapo, DO  cefTRIAXone (ROCEPHIN) 1 g in sodium chloride 0.9 % 100 mL IVPB  1 g Intravenous Q24H Adefeso, Oladapo, DO       Chlorhexidine Gluconate Cloth 2 % PADS 6 each  6 each Topical Q0600 Adefeso, Oladapo, DO       Chlorhexidine Gluconate Cloth 2 % PADS 6 each  6 each Topical Q0600 Adefeso, Oladapo,  DO   6 each at 07/08/22 0603   feeding supplement (ENSURE ENLIVE / ENSURE PLUS) liquid 237 mL  237 mL Oral BID BM Adefeso, Oladapo, DO       fentaNYL (SUBLIMAZE) injection 25 mcg  25 mcg Intravenous Q2H PRN Adefeso, Oladapo, DO   25 mcg at 07/08/22 0500   pantoprazole (PROTONIX) EC tablet 40 mg  40 mg Oral Daily Adefeso, Oladapo, DO   40 mg at 07/08/22 0542     ALLERGIES Patient has no known allergies.  MEDICAL HISTORY Past Medical History:  Diagnosis Date   Anemia    Blind    Blind    ESRD (end stage renal disease) ([place])    Foot ulceration ([place]) 12/2018   Hypertension    PAD (peripheral artery disease) ([facility])      SOCIAL HISTORY Social History   Socioeconomic History   Marital status: Single    Spouse name: Not on file   Number of children: Not on file   Years of education: Not on file   Highest education level: Not on file  Occupational History   Not on file  Tobacco Use   Smoking status: Former    Packs/day: 0.50    Years: 20.00    Total pack years: 10.00    Types: Cigarettes    Quit date: 01/14/2019    Years since quitting: 3.4   Smokeless tobacco: Never   Tobacco comments:    <1 PPD  Vaping Use   Vaping Use: Never used  Substance and Sexual Activity   Alcohol use: Never   Drug use: Not Currently    Types: Marijuana   Sexual activity: Never  Other Topics Concern   Not on file  Social History Narrative   Not on file   Social Determinants of Health   Financial Resource Strain: Not on file  Food Insecurity: Not on file  Transportation Needs: Not on file  Physical Activity: Not on file  Stress: Not on file  Social Connections: Not on file  Intimate Partner Violence: Not on file     FAMILY HISTORY Family History  Problem Relation Age of Onset   Peripheral Artery Disease Neg Hx    Kidney failure Neg Hx      Review of Systems: 12 systems were reviewed and negative except per HPI  Physical Exam: Vitals:   07/08/22 0506 07/08/22 0538  BP: 102/68  123/75  Pulse: 94 93  Resp: 18 16  Temp: 99.5 F (37.5 C) 98.6 F (37 C)  SpO2: 98% 98%   No intake/output data recorded. No intake or output data in the 24 hours ending 07/08/22 0839 General: well-appearing, no acute distress, laying flat in bed HEENT: anicteric sclera, MMM CV: normal rate, no murmurs Lungs: bilateral chest rise, normal wob Abd: soft, slightly tender on palpation but no rigidity, non-distended Skin: no visible lesions or rashes Psych: alert, engaged, appropriate mood and affect Msk: trace edema b/l LE's Neuro: normal speech, no gross focal deficits, blind Dialysis access: LUE AVF +b/t, dressing in place  Test Results Reviewed Lab Results  Component Value Date   NA 138  07/07/2022   K 3.7 07/07/2022   CL 97 (L) 07/07/2022   CO2 27 07/07/2022   BUN 37 (H) 07/07/2022   CREATININE 7.34 (H) 07/07/2022   CALCIUM 9.1 07/07/2022   ALBUMIN 3.0 (L) 07/07/2022   PHOS 4.2 06/18/2022    I have reviewed relevant outside healthcare records

## 2022-07-08 NOTE — Progress Notes (Signed)
Progress Note   Patient: Rebekah Burton ENI:[id number] DOB: July 31, 1976 DOA: 07/07/2022     0 DOS: the patient was seen and examined on 07/08/2022   Brief hospital course: 46 y.o. female with medical history significant of hypertension, ESRD on HD (MWF), PAD, blindness, bilateral transmetatarsal amputation and tobacco abuse who presents to the emergency department due to lower abdominal/pelvic pain which started on Tuesday afternoon (7/11).  Patient states that after dialysis on Monday, she started to have a slow bleed from the fistula site of left arm, on the following day (Tuesday), she developed an abdominal pain which was of moderate intensity and worsens with movement, there was no known alleviating factor.  She saw her PCP who started her on an antibiotic for UTI, but she receives her medications via mail, so she has not received the medication yet.  She states that she missed dialysis on Wednesday due to severity of abdominal pain.  Patient states that she gets short of breath sometimes during dialysis and supplemental oxygen via [place] at 2 LPM was initially given with improvement in O2 sat.  She denies fever, chills, chest pain, headache, lightheadedness.    Assessment and Plan: Symptomatic anemia Acute blood loss anemia H/H 6.7/21.4 (this was 7.3/25.9 on 06/18/2022) Given 1 unit PRBC with Hgb remaining 6 Ordered an additional unit PRBC Follow CBC trend  Sepsis possibly secondary to left sided pyelonephritis Patient was febrile, tachycardic and tachypneic (met SIRS criteria), CT abdomen and pelvis was suggestive of left renal infection. 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   Prolonged QT interval QTc 502 ms Avoid QT prolonging drugs Magnesium level was 2.1 Cont to monitor on tele   Pancreatic cyst CT abdomen and pelvis showed cystic lesion of the tail of the pancreas measuring 17 mm Nonemergent contrast-enhanced MRCP was recommended Elevated BNP (chronic) Consider ordering when more stable   Essential hypertension Continue Coreg  GERD Continue Protonix   Tobacco use disorder Patient counseled on tobacco abuse cessation  Coronary calcification -Recent CTA chest reviewed with findings of 4 vessel coronary calcification -Ordered and reviewed 2d echo, normal LVEF with no WMA  Abd pain -CT abd reviewed. 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 and distal reconstitution of flow, similar to prior study -Also mention of mildly dilated loops of SB concerning for early or partial SB obstruction -Will repeat abd xray in AM, if symptomatic, consider NG      Subjective: Complaining of lower quadrant abd pain  Physical Exam: Vitals:   07/08/22 1251 07/08/22 1352 07/08/22 1639 07/08/22 1709  BP: 123/72 126/82 (!) 156/98 131/82  Pulse: 84 84 93 94  Resp: 18 18 18 18   Temp: 98.3 F (36.8 C) 98.9 F (37.2 C) 98.3 F (36.8 C) 99.5 F (37.5 C)  TempSrc: Oral Oral Oral Oral  SpO2: 100%  100% 100%   General exam: Awake, laying in bed, in nad Respiratory system: Normal respiratory effort, no wheezing Cardiovascular system: regular rate, s1, s2 Gastrointestinal system: Soft, nondistended, decreased BS Central nervous system: CN2-12 grossly intact, strength  intact Extremities: Perfused, no clubbing Skin: Normal skin turgor, no notable skin lesions seen Psychiatry: Mood normal // no visual hallucinations   Data Reviewed:  Labs reviewed: Na 136, K 3.9, Cr 8.2, WBC 9.6, Hgb  6.1   Family Communication: Pt in room, family not at bedside  Disposition: Status is: Inpatient Remains inpatient appropriate because: Severity of illness  Planned Discharge Destination: Home    Author: Marylu Lund, MD 07/08/2022 6:46 PM  For on call review [email or web address].

## 2022-07-08 NOTE — ED Provider Notes (Signed)
  Provider Note MRN:  [id number]  Arrival date & time: 07/08/22    ED Course and Medical Decision Making  Assumed care from Dr. Regenia Skeeter at shift change.  ESRD patient presenting with abdominal pain.  CT revealing partial SBO, labs revealing anemia at 6.0.  Will need admission.  Patient accepted for admission.  On my assessment she is resting comfortably, still endorsing pain.  Ordering blood transfusion.  She consents to blood.  .Critical Care  Performed by: Maudie Flakes, MD Authorized by: Maudie Flakes, MD   Critical care provider statement:    Critical care time (minutes):  35   Critical care was necessary to treat or prevent imminent or life-threatening deterioration of the following conditions: Symptomatic anemia requiring blood transfusion.   Critical care was time spent personally by me on the following activities:  Development of treatment plan with patient or surrogate, discussions with consultants, evaluation of patient's response to treatment, examination of patient, ordering and review of laboratory studies, ordering and review of radiographic studies, ordering and performing treatments and interventions, pulse oximetry, re-evaluation of patient's condition and review of old charts   Final Clinical Impressions(s) / ED Diagnoses     ICD-10-CM   1. ESRD (end stage renal disease) ([place])  N18.6     2. Anemia, unspecified type  D64.9     3. Elevated brain natriuretic peptide (BNP) level  R79.89     4. Acute pulmonary edema ([place])  J81.0       ED Discharge Orders     None       Discharge Instructions   None     Barth Kirks. [place] Small, [employer] [email or web address]    Maudie Flakes, MD 07/08/22 0700

## 2022-07-08 NOTE — ED Notes (Signed)
Admit Provider at bedside. Verbal order to hold PRBU for 1 hour for temp to decrease. Blood placed in cooler provided by blood bank.

## 2022-07-08 NOTE — Hospital Course (Signed)
46 y.o. female with medical history significant of hypertension, ESRD on HD (MWF), PAD, blindness, bilateral transmetatarsal amputation and tobacco abuse who presents to the emergency department due to lower abdominal/pelvic pain which started on Tuesday afternoon (7/11).  Patient states that after dialysis on Monday, she started to have a slow bleed from the fistula site of left arm, on the following day (Tuesday), she developed an abdominal pain which was of moderate intensity and worsens with movement, there was no known alleviating factor.  She saw her PCP who started her on an antibiotic for UTI, but she receives her medications via mail, so she has not received the medication yet.  She states that she missed dialysis on Wednesday due to severity of abdominal pain.  Patient states that she gets short of breath sometimes during dialysis and supplemental oxygen via  at 2 LPM was initially given with improvement in O2 sat.  She denies fever, chills, chest pain, headache, lightheadedness.

## 2022-07-08 NOTE — Progress Notes (Signed)
Pt's hemoglobin called in to Attending that result was 6.1. Awaiting new orders.

## 2022-07-08 NOTE — Progress Notes (Signed)
*  PRELIMINARY RESULTS* Echocardiogram 2D Echocardiogram has been performed.  Elpidio Anis 07/08/2022, 10:26 AM

## 2022-07-08 NOTE — TOC Initial Note (Signed)
Transition of Care [facility]) - Initial/Assessment Note    Patient Details  Name: Rebekah Burton MRN: [id number] Date of Birth: April 21, 1976  [facility] of [facility]) CM/SW Contact:    Salome Arnt, LCSW Phone Number: 07/08/2022, 10:45 AM  Clinical Narrative:  Pt admitted due to symptomatic anemia. Pt has been a resident at [facility] for over a year. Per Jamas Lav at facility, pt requires assist with ADLs primarily because she is blind. No current home health or DME. Okay to return. Pt's dialysis schedule is MWF at [facility]. TOC will continue to follow.                   Expected Discharge Plan: Assisted Living Barriers to Discharge: Continued Medical Work up   Patient Goals and CMS Choice Patient states their goals for this hospitalization and ongoing recovery are:: anticipate return to [facility]      Expected Discharge Plan and Services Expected Discharge Plan: Assisted Living In-house Referral: Clinical Social Work                                            Prior Living Arrangements/Services   Lives with:: Facility Resident Patient language and need for interpreter reviewed:: Yes        Need for Family Participation in Patient Care: No (Comment) Care giver support system in place?: Yes (comment)   Criminal Activity/Legal Involvement Pertinent to Current Situation/Hospitalization: No - Comment as needed  Activities of Daily Living Home Assistive Devices/Equipment: None ADL Screening (condition at time of admission) Patient's cognitive ability adequate to safely complete daily activities?: Yes Is the patient deaf or have difficulty hearing?: No Does the patient have difficulty seeing, even when wearing glasses/contacts?: Yes Does the patient have difficulty concentrating, remembering, or making decisions?: No Patient able to express need for assistance with ADLs?: Yes Does the patient have difficulty dressing or bathing?: No Independently  performs ADLs?: Yes (appropriate for developmental age) Does the patient have difficulty walking or climbing stairs?: No Weakness of Legs: Both Weakness of Arms/Hands: None  Permission Sought/Granted                  Emotional Assessment         Alcohol / Substance Use: Not Applicable Psych Involvement: No (comment)  Admission diagnosis:  Acute pulmonary edema ([place]) [J81.0] ESRD (end stage renal disease) ([place]) [N18.6] Elevated brain natriuretic peptide (BNP) level [R79.89] Symptomatic anemia [D64.9] Anemia, unspecified type [D64.9] Patient Active Problem List   Diagnosis Date Noted   Symptomatic anemia 07/08/2022   Sepsis ([place]) 07/08/2022   Acute pyelonephritis 07/08/2022   Hypoalbuminemia due to protein-calorie malnutrition ([place]) 07/08/2022   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   Hypokalemia 06/18/2022   Tobacco use disorder 06/18/2022   GERD (gastroesophageal reflux disease) 29/47/6546   Complication of vascular access for dialysis    Poor social situation    Left breast abscess    Breast abscess 01/15/2021   COVID-19 virus infection 01/15/2021   Breast abscess of female 01/15/2021   Cellulitis of left lower extremity 02/05/2019   Post-operative pain    Acute blood loss anemia    S/P transmetatarsal amputation of foot, left ([place])    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   ESRD (end stage renal disease) ([place]) 01/16/2019   Essential hypertension 01/16/2019   Blind in both eyes  01/16/2019   PAD (peripheral artery disease) ([place]) 01/16/2019   PCP:  [name], NP Pharmacy:   [facility], [place] [address] [address] Phone: [phone] Fax: [phone]     Social Determinants of Health (SDOH) Interventions    Readmission Risk Interventions    03/16/2021    2:06 PM  Readmission Risk Prevention Plan  Transportation Screening Complete  PCP or Specialist Appt within 5-7 Days Complete   Home Care Screening Complete  Medication Review (RN CM) Complete

## 2022-07-08 NOTE — H&P (Signed)
History and Physical    Patient: Rebekah Burton MBW:[id number] DOB: [birth year] DOA: 07/07/2022 DOS: the patient was seen and examined on 07/08/2022 PCP: [name], NP  Patient coming from: Home  Chief Complaint:  Chief Complaint  Patient presents with   Abdominal Pain   HPI: Rebekah Burton is a 46 y.o. female with medical history significant of hypertension, ESRD on HD (MWF), PAD, blindness, bilateral transmetatarsal amputation and tobacco abuse who presents to the emergency department due to lower abdominal/pelvic pain which started on Tuesday afternoon (7/11).  Patient states that after dialysis on Monday, she started to have a slow bleed from the fistula site of left arm, on the following day (Tuesday), she developed an abdominal pain which was of moderate intensity and worsens with movement, there was no known alleviating factor.  She saw her PCP who started her on an antibiotic for UTI, but she receives her medications via mail, so she has not received the medication yet.  She states that she missed dialysis on Wednesday due to severity of abdominal pain.  Patient states that she gets short of breath sometimes during dialysis and supplemental oxygen via [place] at 2 LPM was initially given with improvement in O2 sat.  She denies fever, chills, chest pain, headache, lightheadedness.  ED Course:  In the emergency department, she was febrile with a temperature of 101.47F, tachypneic, tachycardic, BP was 135/117.  O2 sat was 90% on supplemental oxygen via [place] at 2 LPM.  In the ED showed H/H 6.7/21.4 (this was 7.3/25.9 on 06/18/2022), thrombocytopenia, hypokalemia, BUN/creatinine 56/10.50.  FOBT was negative, magnesium 2.1. Transvaginal ultrasound done showed visualize left ovary.  No ovarian/adnexal mass.  Color flow and venous waveforms documented in the left ovary.  Unable to document arterial waveform, but torsion unlikely given the venous and color flow noted CT abdomen and pelvis with contrast  showed: Numerous mildly dilated loops of small bowel with no definite transition point, concerning for early or partial small bowel obstruction. 2. Focal heterogeneous area of the interpolar region of the left kidney, possibly due to infection, although renal mass can not be excluded. Correlate with urinalysis and recommend follow-up renal protocol CT in 3 months to ensure resolution. IV fentanyl was given, Tylenol was given due to fever, patient had hemodialysis.  Hospitalist was asked to admit patient for further evaluation and management.   Review of Systems: Review of systems as noted in the HPI. All other systems reviewed and are negative.   Past Medical History:  Diagnosis Date   Anemia    Blind    Blind    ESRD (end stage renal disease) ([place])    Foot ulceration ([place]) 12/2018   Hypertension    PAD (peripheral artery disease) [facility])    Past Surgical History:  Procedure Laterality Date   ABDOMINAL AORTOGRAM W/LOWER EXTREMITY Bilateral 01/17/2019   Procedure: ABDOMINAL AORTOGRAM W/LOWER EXTREMITY;  Surgeon: Waynetta Sandy, MD;  Location: [place] [facility];  Service: Cardiovascular;  Laterality: Bilateral;   ABDOMINAL HYSTERECTOMY     ENDARTERECTOMY FEMORAL Left 01/18/2019   Procedure: ENDARTERECTOMY FEMORAL with Perfundaplasty;  Surgeon: Rosetta Posner, MD;  Location: [facility];  Service: Vascular;  Laterality: Left;   FEMORAL-POPLITEAL BYPASS GRAFT Left 01/18/2019   Procedure: Left  FEMORAL- to  Below the Knee POPLITEAL ARTERY bypass using reversed safenous vein.;  Surgeon: Rosetta Posner, MD;  Location: [place];  Service: Vascular;  Laterality: Left;   INCISION AND DRAINAGE ABSCESS Left 01/15/2021   Procedure: INCISION  AND DRAINAGE BREAST ABSCESS;  Surgeon: Dwan Bolt, MD;  Location: [place];  Service: General;  Laterality: Left;   INCISION AND DRAINAGE ABSCESS Left 02/25/2021   Procedure: INCISION AND DRAINAGE BREAST  ABSCESS;  Surgeon: Dwan Bolt, MD;  Location: [place];   Service: General;  Laterality: Left;   INCISION AND DRAINAGE ABSCESS Left 10/26/2021   Procedure: INCISION AND DRAINAGE OF RECURRENT LEFT BREAST ABSCESS;  Surgeon: Donnie Mesa, MD;  Location: [place];  Service: General;  Laterality: Left;   IR DIALY SHUNT INTRO NEEDLE/INTRACATH INITIAL W/IMG LEFT Left 01/28/2021   IR THROMBECTOMY AV FISTULA W/THROMBOLYSIS/PTA/STENT INC/SHUNT/IMG LT Left 10/23/2020   IR US GUIDE BX ASP/DRAIN  01/28/2021   IR US GUIDE VASC ACCESS LEFT  01/26/2021   toe removal Right    All toes on right foot have been removed.    TRANSMETATARSAL AMPUTATION Left 01/18/2019   Procedure: TRANSMETATARSAL AMPUTATION;  Surgeon: Rosetta Posner, MD;  Location: [facility];  Service: Vascular;  Laterality: Left;    Social History:  reports that she quit smoking about 3 years ago. Her smoking use included cigarettes. She has a 10.00 pack-year smoking history. She has never used smokeless tobacco. She reports that she does not currently use drugs after having used the following drugs: Marijuana. She reports that she does not drink alcohol.   No Known Allergies  Family History  Problem Relation Age of Onset   Peripheral Artery Disease Neg Hx    Kidney failure Neg Hx      Prior to Admission medications   Medication Sig Start Date End Date Taking? Authorizing Provider  b complex-vitamin c-folic acid (NEPHRO-VITE) 0.8 MG TABS tablet Take 1 tablet by mouth every Monday, Wednesday, and Friday with hemodialysis. 03/17/21  Yes Samella Parr, NP  carvedilol (COREG) 25 MG tablet Take 1 tablet (25 mg total) by mouth 2 (two) times daily. 03/17/21  Yes Samella Parr, NP  cloNIDine (CATAPRES) 0.3 MG tablet Take 1 tablet (0.3 mg total) by mouth 3 (three) times daily. 03/17/21  Yes Samella Parr, NP  ibuprofen (ADVIL) 600 MG tablet Take 1 tablet (600 mg total) by mouth 3 (three) times daily. Patient taking differently: Take 600 mg by mouth every 8 (eight) hours as needed (pain). 03/17/21  Yes Samella Parr, NP  lanthanum (FOSRENOL) 1000 MG chewable tablet Chew 1 tablet (1,000 mg total) by mouth 3 (three) times daily with meals. 03/18/21  Yes Samella Parr, NP  pantoprazole (PROTONIX) 40 MG tablet Take 1 tablet (40 mg total) by mouth daily. 03/17/21  Yes Samella Parr, NP  senna (SENOKOT) 8.6 MG TABS tablet Take 1 tablet (8.6 mg total) by mouth 2 (two) times daily. 03/17/21  Yes Samella Parr, NP  traZODone (DESYREL) 150 MG tablet Take 150 mg by mouth at bedtime. 05/01/21  Yes [provider]  APIXABAN Arne Cleveland) VTE STARTER PACK (10MG AND 5MG) Take as directed on package: start with two-20m tablets twice daily for 7 days. On day 8, switch to one-593mtablet twice daily. Patient not taking: Reported on 07/07/2022 06/18/22   LaLittle IshikawaMD    Physical Exam: BP 123/75 (BP Location: Right Arm)   Pulse 93   Temp 98.6 F (37 C) (Oral)   Resp 16   SpO2 98%   General: [age].o. year-old female ill appearing, but not in any acute distress.  Alert and oriented x3. HEENT: NCAT  Neck: Supple, trachea medial Cardiovascular: Tachycardia.  Regular rate and rhythm with no rubs or gallops.  No thyromegaly or JVD noted.  2/4 pulses in all 4 extremities. Respiratory: Tachypnea.  Clear to auscultation with no wheezes or rales. Good inspiratory effort. Abdomen: Soft, tender to palpation.  Nondistended with normal bowel sounds x4 quadrants. Muskuloskeletal: Fistula site on left arm with palpable thrill.  No cyanosis, clubbing or edema noted bilaterally Neuro: CN II-XII intact, strength 5/5 x 4, sensation, reflexes intact Skin: No ulcerative lesions noted or rashes Psychiatry: Judgement and insight appear normal. Mood is appropriate for condition and setting          Labs on Admission:  Basic Metabolic Panel: Recent Labs  Lab 07/07/22 1108 07/07/22 2320  NA 136 138  K 3.3* 3.7  CL 100 97*  CO2 25 27  GLUCOSE 129* 123*  BUN 56* 37*  CREATININE 10.50* 7.34*  CALCIUM 8.9 9.1  MG   --  2.1   Liver Function Tests: Recent Labs  Lab 07/07/22 1108  AST 24  ALT 21  ALKPHOS 59  BILITOT 1.2  PROT 6.1*  ALBUMIN 3.0*   Recent Labs  Lab 07/07/22 1108  LIPASE 18   No results for input(s): "AMMONIA" in the last 168 hours. CBC: Recent Labs  Lab 07/07/22 1215 07/07/22 2320  WBC 6.7 8.4  NEUTROABS 5.9 7.6  HGB 6.0* 6.7*  HCT 19.5* 21.4*  MCV 91.1 90.7  PLT 103* 125*   Cardiac Enzymes: No results for input(s): "CKTOTAL", "CKMB", "CKMBINDEX", "TROPONINI" in the last 168 hours.  BNP (last 3 results) Recent Labs    07/07/22 1107  BNP 2,010.0*    ProBNP (last 3 results) No results for input(s): "PROBNP" in the last 8760 hours.  CBG: No results for input(s): "GLUCAP" in the last 168 hours.  Radiological Exams on Admission: US Pelvis Complete  Result Date: 07/07/2022 CLINICAL DATA:  Left lower quadrant pain. Focal abnormality seen on CT. EXAM: TRANSABDOMINAL AND TRANSVAGINAL ULTRASOUND OF PELVIS DOPPLER ULTRASOUND OF OVARIES TECHNIQUE: Both transabdominal and transvaginal ultrasound examinations of the pelvis were performed. Transabdominal technique was performed for global imaging of the pelvis including uterus, ovaries, adnexal regions, and pelvic cul-de-sac. It was necessary to proceed with endovaginal exam following the transabdominal exam to visualize the ovaries/adnexa. Color and duplex Doppler ultrasound was utilized to evaluate blood flow to the ovaries. COMPARISON:  CT today FINDINGS: Uterus Measurements: Prior hysterectomy Endometrium Thickness: Prior hysterectomy. Right ovary Measurements: Not visualized.  No adnexal mass seen. Left ovary Measurements: 3.1 x 1.9 x 2.1 cm = volume: 6.5 mL. Small follicles noted within the left ovary. Color flow visualized within the left ovary. Pulsed Doppler evaluation of the left ovary demonstrates color flow visualized. Venous flow is also documented. Unable to document/visualize arterial flow. Other findings Large  amount of ascites. IMPRESSION: Left ovary is visualized. No ovarian/adnexal mass. Color flow and venous waveforms documented in the left ovary. Unable to document arterial waveform, but torsion unlikely given the venous and color flow noted. Prior hysterectomy. Large volume ascites. Electronically Signed   By: Rolm Baptise M.D.   On: 07/07/2022 18:21   US Transvaginal Non-OB  Result Date: 07/07/2022 CLINICAL DATA:  Left lower quadrant pain. Focal abnormality seen on CT. EXAM: TRANSABDOMINAL AND TRANSVAGINAL ULTRASOUND OF PELVIS DOPPLER ULTRASOUND OF OVARIES TECHNIQUE: Both transabdominal and transvaginal ultrasound examinations of the pelvis were performed. Transabdominal technique was performed for global imaging of the pelvis including uterus, ovaries, adnexal regions, and pelvic cul-de-sac. It was necessary to proceed with endovaginal  exam following the transabdominal exam to visualize the ovaries/adnexa. Color and duplex Doppler ultrasound was utilized to evaluate blood flow to the ovaries. COMPARISON:  CT today FINDINGS: Uterus Measurements: Prior hysterectomy Endometrium Thickness: Prior hysterectomy. Right ovary Measurements: Not visualized.  No adnexal mass seen. Left ovary Measurements: 3.1 x 1.9 x 2.1 cm = volume: 6.5 mL. Small follicles noted within the left ovary. Color flow visualized within the left ovary. Pulsed Doppler evaluation of the left ovary demonstrates color flow visualized. Venous flow is also documented. Unable to document/visualize arterial flow. Other findings Large amount of ascites. IMPRESSION: Left ovary is visualized. No ovarian/adnexal mass. Color flow and venous waveforms documented in the left ovary. Unable to document arterial waveform, but torsion unlikely given the venous and color flow noted. Prior hysterectomy. Large volume ascites. Electronically Signed   By: Rolm Baptise M.D.   On: 07/07/2022 18:21   [place] Art/Ven Flow Abd Pelv Doppler  Result Date: 07/07/2022 CLINICAL  DATA:  Left lower quadrant pain. Focal abnormality seen on CT. EXAM: TRANSABDOMINAL AND TRANSVAGINAL ULTRASOUND OF PELVIS DOPPLER ULTRASOUND OF OVARIES TECHNIQUE: Both transabdominal and transvaginal ultrasound examinations of the pelvis were performed. Transabdominal technique was performed for global imaging of the pelvis including uterus, ovaries, adnexal regions, and pelvic cul-de-sac. It was necessary to proceed with endovaginal exam following the transabdominal exam to visualize the ovaries/adnexa. Color and duplex Doppler ultrasound was utilized to evaluate blood flow to the ovaries. COMPARISON:  CT today FINDINGS: Uterus Measurements: Prior hysterectomy Endometrium Thickness: Prior hysterectomy. Right ovary Measurements: Not visualized.  No adnexal mass seen. Left ovary Measurements: 3.1 x 1.9 x 2.1 cm = volume: 6.5 mL. Small follicles noted within the left ovary. Color flow visualized within the left ovary. Pulsed Doppler evaluation of the left ovary demonstrates color flow visualized. Venous flow is also documented. Unable to document/visualize arterial flow. Other findings Large amount of ascites. IMPRESSION: Left ovary is visualized. No ovarian/adnexal mass. Color flow and venous waveforms documented in the left ovary. Unable to document arterial waveform, but torsion unlikely given the venous and color flow noted. Prior hysterectomy. Large volume ascites. Electronically Signed   By: Rolm Baptise M.D.   On: 07/07/2022 18:21   CT Abdomen Pelvis W Contrast  Result Date: 07/07/2022 CLINICAL DATA:  Abdominal pain EXAM: CT ABDOMEN AND PELVIS WITH CONTRAST TECHNIQUE: Multidetector CT imaging of the abdomen and pelvis was performed using the standard protocol following bolus administration of intravenous contrast. RADIATION DOSE REDUCTION: This exam was performed according to the departmental dose-optimization program which includes automated exposure control, adjustment of the mA and/or kV according to  patient size and/or use of iterative reconstruction technique. CONTRAST:  143m OMNIPAQUE IOHEXOL 300 MG/ML  SOLN COMPARISON:  Chest CT dated June 17, 2022 FINDINGS: Lower chest: Bibasilar atelectasis. Small right pleural effusion. Cardiomegaly. Coronary artery calcifications. Hepatobiliary: No focal liver abnormality is seen. Gallstones with no gallbladder wall thickening. No biliary ductal dilation. Pancreas: No pancreatic ductal dilation. Cystic lesion of the tail of the pancreas measuring 17 mm on series 2, image 22. Spleen: Normal in size without focal abnormality. Adrenals/Urinary Tract: Bilateral adrenal glands are unremarkable. No hydronephrosis. Heterogeneous area of the interpolar region of the left kidney. Bilateral low-attenuation renal lesions, largest are compatible with simple cysts, others are too small to completely characterize. Bladder is decompressed. Stomach/Bowel: Normal appearance of the stomach. Mildly dilated loops of distal small bowel with no definite transition point. No bowel wall thickening or inflammatory change. Normal appendix. Vascular/Lymphatic: Severe aortic  atherosclerotic disease. 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 Occlusion of the partially visualized bilateral superficial femoral arteries. Pathologically enlarged lymph nodes seen in the abdomen or pelvis. Reproductive: Uterus is unremarkable. Indeterminate enhancing focus of the left pelvis measuring 2.2 cm on series 2, image 65. Other: Diffuse soft tissue anasarca. Numerous varices of the anterior abdominal wall. Small volume abdominal ascites. Musculoskeletal: Diffuse osseous sclerosis, likely sequela of chronic renal disease. IMPRESSION: 1. Numerous mildly dilated loops of small bowel with no definite transition point, concerning for early or partial small bowel obstruction. 2. Focal heterogeneous area of the  interpolar region of the left kidney, possibly due to infection, although renal mass can not be excluded. Correlate with urinalysis and recommend follow-up renal protocol CT in 3 months to ensure resolution. 3. Small right pleural effusion and small volume abdominal ascites. 4. Cystic lesion of the tail of the pancreas measuring 17 mm. Recommend nonemergent contrast-enhanced MRCP for further evaluation. 5. Indeterminate enhancing focus of the left pelvis measuring 2.2 cm, likely the left ovary, although evaluation is limited due to abdominal ascites. Finding could be further evaluated with pelvic ultrasound. 6. Severe aortic Atherosclerosis (ICD10-I70.0). 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 Occlusion of the partially visualized bilateral superficial femoral arteries. 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 Electronically Signed   By: Yetta Glassman M.D.   On: 07/07/2022 16:20   DG Chest 2 View  Result Date: 07/07/2022 CLINICAL DATA:  Shortness of breath. EXAM: CHEST - 2 VIEW COMPARISON:  Chest x-ray May 04, 2021. FINDINGS: Enlarged cardiac silhouette . Pulmonary vascular congestion. Suspected mild pulmonary edema. No confluent consolidation. No definite pleural effusions or visible pneumothorax. IMPRESSION: Cardiomegaly, pulmonary vascular congestion suspected mild pulmonary edema. Electronically Signed   By: Margaretha Sheffield M.D.   On: 07/07/2022 11:28    EKG: I independently viewed the EKG done and my findings are as followed: Normal sinus rhythm at a rate of 75 bpm with QTc 502 ms  Assessment/Plan Present on Admission:  Symptomatic anemia  ESRD (end stage renal disease) ([place])  Acquired abnormality of superior vena cava  Tobacco use disorder  Hypokalemia  Acute blood loss anemia  Principal Problem:   Symptomatic anemia Active Problems:   ESRD (end stage renal disease) ([facility])   Essential hypertension   Acute  blood loss anemia   Acquired abnormality of superior vena cava   Hypokalemia   Tobacco use disorder   Sepsis ([place])   Acute pyelonephritis   Hypoalbuminemia due to protein-calorie malnutrition ([facility])  Symptomatic anemia Acute blood loss anemia H/H 6.7/21.4 (this was 7.3/25.9 on 06/18/2022) Type and screen was done 1 unit of PRBC was transfused Continue to monitor H/H  Sepsis possibly secondary to left sided pyelonephritis Patient was febrile, tachycardic and tachypneic (met SIRS criteria), CT abdomen and pelvis was suggestive of left renal infection. Patient will be empirically started on IV ceftriaxone Continue Tylenol as needed for fever Blood culture pending  ESRD on HD (MWF) Patient had dialysis yesterday due to missing her dialysis on Wednesday Patient will resume normal dialysis schedule Nephrology will be consulted and we shall await further recommendation Continue Fosrenol and Rena-Vite  Hypoalbuminemia Albumin 3.0, protein supplement to be provided  Hypokalemia K+ 3.3, this will be replenished  Acquired abnormality of superior vena cava Continue Eliquis  Prolonged QT interval QTc 502 ms  Avoid QT prolonging drugs Magnesium level was 2.1 Potassium will be replenished Continue telemetry  Pancreatic cyst CT abdomen and pelvis showed cystic lesion of the tail of the pancreas measuring 17 mm Nonemergent contrast-enhanced MRCP was recommended Elevated BNP (chronic)  Essential hypertension Continue Coreg  GERD Continue Protonix  Tobacco use disorder Patient counseled on tobacco abuse cessation   DVT prophylaxis: Eliquis  Code Status: Full code  Consults: Nephrology  Family Communication: None at bedside  Severity of Illness: The appropriate patient status for this patient is INPATIENT. Inpatient status is judged to be reasonable and necessary in order to provide the required intensity of service to ensure the patient's safety. The patient's presenting  symptoms, physical exam findings, and initial radiographic and laboratory data in the context of their chronic comorbidities is felt to place them at high risk for further clinical deterioration. Furthermore, it is not anticipated that the patient will be medically stable for discharge from the hospital within 2 midnights of admission.   * I certify that at the point of admission it is my clinical judgment that the patient will require inpatient hospital care spanning beyond 2 midnights from the point of admission due to high intensity of service, high risk for further deterioration and high frequency of surveillance required.*  Author: Bernadette Hoit, DO 07/08/2022 5:39 AM  For on call review [email or web address].

## 2022-07-08 NOTE — Progress Notes (Signed)
15 min post transfusion pt stable, VSS. Pt only c/o is abdominal pain, rates 8/10, ordered pain med given. Reminded pt to call for any increased SOB, chest pain, or other issues, states understanding. Pt refuses her supper tray at this time, states, "I just ain't hungry."

## 2022-07-08 NOTE — Progress Notes (Deleted)
This LPN assisted pt with taking a shower. PT tolerated well vitals were stable, no complaints of SOB or lightheaded. Pt's bed linin was changed and personal hygiene products provided. Pt is now back in bed with family at bedside. Pt has on slip socks. And Bed put in lowest position and call bell in reach for safety. Will continue to monitor.

## 2022-07-09 ENCOUNTER — Inpatient Hospital Stay (HOSPITAL_COMMUNITY): Payer: Medicare HMO

## 2022-07-09 DIAGNOSIS — D62 Acute posthemorrhagic anemia: Secondary | ICD-10-CM | POA: Diagnosis not present

## 2022-07-09 DIAGNOSIS — D649 Anemia, unspecified: Secondary | ICD-10-CM | POA: Diagnosis not present

## 2022-07-09 DIAGNOSIS — N10 Acute tubulo-interstitial nephritis: Secondary | ICD-10-CM | POA: Diagnosis not present

## 2022-07-09 LAB — BLOOD CULTURE ID PANEL (REFLEXED) - BCID2

## 2022-07-09 LAB — CBC
HCT: 23.6 % — ABNORMAL LOW (ref 36.0–46.0)
Hemoglobin: 7.5 g/dL — ABNORMAL LOW (ref 12.0–15.0)
MCH: 28.6 pg (ref 26.0–34.0)
MCHC: 31.8 g/dL (ref 30.0–36.0)
MCV: 90.1 fL (ref 80.0–100.0)
Platelets: 95 10*3/uL — ABNORMAL LOW (ref 150–400)
RBC: 2.62 MIL/uL — ABNORMAL LOW (ref 3.87–5.11)
RDW: 18.3 % — ABNORMAL HIGH (ref 11.5–15.5)
WBC: 10.2 10*3/uL (ref 4.0–10.5)
nRBC: 0 % (ref 0.0–0.2)

## 2022-07-09 LAB — COMPREHENSIVE METABOLIC PANEL
ALT: 18 U/L (ref 0–44)
AST: 17 U/L (ref 15–41)
Albumin: 2.9 g/dL — ABNORMAL LOW (ref 3.5–5.0)
Alkaline Phosphatase: 62 U/L (ref 38–126)
Anion gap: 14 (ref 5–15)
BUN: 52 mg/dL — ABNORMAL HIGH (ref 6–20)
CO2: 27 mmol/L (ref 22–32)
Calcium: 9.5 mg/dL (ref 8.9–10.3)
Chloride: 98 mmol/L (ref 98–111)
Creatinine, Ser: 9.15 mg/dL — ABNORMAL HIGH (ref 0.44–1.00)
GFR, Estimated: 5 mL/min — ABNORMAL LOW (ref >60)
Glucose, Bld: 115 mg/dL — ABNORMAL HIGH (ref 70–99)
Potassium: 4.1 mmol/L (ref 3.5–5.1)
Sodium: 139 mmol/L (ref 135–145)
Total Bilirubin: 2.7 mg/dL — ABNORMAL HIGH (ref 0.3–1.2)
Total Protein: 6.2 g/dL — ABNORMAL LOW (ref 6.5–8.1)

## 2022-07-09 LAB — BPAM RBC
Blood Product Expiration Date: 202308192359
Blood Product Expiration Date: 202308192359
ISSUE DATE / TIME: 202307140128
ISSUE DATE / TIME: 202307141646
Unit Type and Rh: 5100
Unit Type and Rh: 5100

## 2022-07-09 LAB — TYPE AND SCREEN
ABO/RH(D): O POS
Antibody Screen: NEGATIVE
Unit division: 0
Unit division: 0

## 2022-07-09 LAB — IRON AND TIBC
Iron: 17 ug/dL — ABNORMAL LOW (ref 28–170)
Saturation Ratios: 10 % — ABNORMAL LOW (ref 10.4–31.8)
TIBC: 162 ug/dL — ABNORMAL LOW (ref 250–450)
UIBC: 145 ug/dL

## 2022-07-09 LAB — APTT: aPTT: 34 seconds (ref 24–36)

## 2022-07-09 LAB — FERRITIN: Ferritin: 1321 ng/mL — ABNORMAL HIGH (ref 11–307)

## 2022-07-09 LAB — HEPARIN LEVEL (UNFRACTIONATED): Heparin Unfractionated: <0.1 IU/mL — ABNORMAL LOW (ref 0.30–0.70)

## 2022-07-09 MED ORDER — LANTHANUM CARBONATE 500 MG PO CHEW
1000.0000 mg | CHEWABLE_TABLET | Freq: Three times a day (TID) | ORAL | Status: DC
Start: 2022-07-09 — End: 2022-07-20
  Administered 2022-07-10 – 2022-07-19 (×16): 1000 mg via ORAL
  Filled 2022-07-09 (×34): qty 2

## 2022-07-09 MED ORDER — HEPARIN (PORCINE) 25000 UT/250ML-% IV SOLN: 1150.0000 [IU]/h | INTRAVENOUS | Status: DC

## 2022-07-09 MED ORDER — CARVEDILOL 25 MG PO TABS
25.0000 mg | ORAL_TABLET | Freq: Two times a day (BID) | ORAL | Status: DC
  Administered 2022-07-09 – 2022-07-10 (×3): 25 mg via ORAL
  Filled 2022-07-09 (×2): qty 1
  Filled 2022-07-09: qty 2

## 2022-07-09 MED ORDER — LIDOCAINE-PRILOCAINE 2.5-2.5 % EX CREA: 1.0000 | TOPICAL_CREAM | CUTANEOUS | Status: DC | PRN

## 2022-07-09 MED ORDER — HYDRALAZINE HCL 20 MG/ML IJ SOLN
10.0000 mg | Freq: Four times a day (QID) | INTRAMUSCULAR | Status: DC | PRN
  Administered 2022-07-09 – 2022-07-16 (×3): 10 mg via INTRAVENOUS
  Filled 2022-07-09 (×3): qty 1

## 2022-07-09 MED ORDER — BISACODYL 10 MG RE SUPP
10.0000 mg | Freq: Every day | RECTAL | Status: DC | PRN
Start: 2022-07-09 — End: 2022-07-20

## 2022-07-09 MED ORDER — LIDOCAINE HCL (PF) 1 % IJ SOLN: 5.0000 mL | INTRAMUSCULAR | Status: DC | PRN

## 2022-07-09 MED ORDER — RENA-VITE PO TABS
1.0000 | ORAL_TABLET | Freq: Every day | ORAL | Status: DC
  Administered 2022-07-09 – 2022-07-18 (×10): 1 via ORAL
  Filled 2022-07-09 (×10): qty 1

## 2022-07-09 MED ORDER — VANCOMYCIN HCL 1500 MG/300ML IV SOLN
1500.0000 mg | Freq: Once | INTRAVENOUS | Status: AC
  Administered 2022-07-09: 1500 mg via INTRAVENOUS
  Filled 2022-07-09: qty 300

## 2022-07-09 MED ORDER — APIXABAN (ELIQUIS) VTE STARTER PACK (10MG AND 5MG): 5.0000 mg | ORAL_TABLET | Freq: Two times a day (BID) | ORAL | Status: DC

## 2022-07-09 MED ORDER — POLYETHYLENE GLYCOL 3350 17 G PO PACK
17.0000 g | PACK | Freq: Every day | ORAL | Status: DC
Start: 2022-07-09 — End: 2022-07-20
  Administered 2022-07-09 – 2022-07-12 (×2): 17 g via ORAL
  Filled 2022-07-09 (×3): qty 1

## 2022-07-09 MED ORDER — PENTAFLUOROPROP-TETRAFLUOROETH EX AERO: 1.0000 | INHALATION_SPRAY | CUTANEOUS | Status: DC | PRN

## 2022-07-09 MED ORDER — HEPARIN BOLUS VIA INFUSION
2000.0000 [IU] | Freq: Once | INTRAVENOUS | Status: DC
  Filled 2022-07-09: qty 2000

## 2022-07-09 NOTE — Procedures (Signed)
   HEMODIALYSIS TREATMENT NOTE:   Old sanguinous drainage noted to dressing from last treatment on 7/13.  "I hope you can find a new place to stick me; they've been having problems."  Reports pain and burning over access.  Pt states it is an AVF but it feels like firm and rolls like a graft.  1.5 cm ulceration noted at arterial end of AV access (see below photo).  Assessed by Dr. Johnney Ou prior to accessing.  I was unable to cannulate above (one failed attempt) and so placed two 15g needles below.  Max Qb 350 with stable AP -220 to -240.  VP 190.   4 hour heparin-free treatment completed.  UF limited by hypotension.  Net UF 1.8 liters.  All blood was returned.  Prolonged bleeding, esp from art needle site.  Hemostasis was achieved in 30 minutes.  No changes from pre-HD assessment.  Report called to Arletha Pili, RN.   Media Information   Document Information  Photos  Ulceration on proximal aspect of AV access  07/09/2022 14:41  Attached To:  Hospital Encounter on 07/07/22   Source Information  Iver Miklas, Charlott Holler, RN  Ap-Dept 300

## 2022-07-09 NOTE — Progress Notes (Signed)
Pharmacy Antibiotic Note  Rebekah Burton is a 46 y.o. female admitted on 07/07/2022 with cellulitis.  Pharmacy has been consulted for Vancomycin dosing.  Plan: Vancomycin 1500mg  x 1 today then f/u dialysis schedule for additional dosing  Height: 5\' 4"  (162.6 cm) Weight: 100.9 kg (222 lb 7.1 oz) IBW/kg (Calculated) : 54.7  Temp (24hrs), Avg:98.8 F (37.1 C), Min:98 F (36.7 C), Max:100.4 F (38 C)  Recent Labs  Lab 07/07/22 1108 07/07/22 1215 07/07/22 2320 07/08/22 0936 07/09/22 0521  WBC  --  6.7 8.4 9.6 10.2  CREATININE 10.50*  --  7.34* 8.20* 9.15*    Estimated Creatinine Clearance: 9.2 mL/min (A) (by C-G formula based on SCr of 9.15 mg/dL (H)).    No Known Allergies  Antimicrobials this admission: Vancomycin 7/15 >>   Dose adjustments this admission:   Microbiology results:  BCx: pending  UCx: pending   Sputum:    MRSA PCR:   Thank you for allowing pharmacy to be a part of this patient's care.  Hart Robinsons A 07/09/2022 11:17 AM

## 2022-07-09 NOTE — Progress Notes (Addendum)
15 YF with ESRD on IHD, b/l TMA, admitted for sepsis 2/2 suspected left sided pyelonephritis found to have MRSA bacteremia. Presented with abdominal pain. She had been started on antibiotics for UTI by PC(had not started the meds).   #MRSA bacteremia -CT showed dilated small bowel oops, c/f SBO. Focal heterogenous region in left kidney 2/2 infeciton vs renal mass. Severe aortic atherosclerosis with occlusion of proximal  SMA and distal reconstitution of flow similar to prior exam. Indeterminate focus left pelvis.  -Pelvis US showed torsion unlikely, no ovarian adnexal mass.  -TTE showed calcification on chordae tenddinae of MV. MV degenerative.  Reccs -Add Vancomycin -Continue ceftriaxone for now as Cx mature. No UA on admission(pt aneuric?). Obtain UA with Cx.  -Repeat blood Cx -Will need TEE -Formal ID consult to follow

## 2022-07-09 NOTE — Progress Notes (Addendum)
Progress Note   Patient: Rebekah Burton LKJ:[id number] DOB: Feb 24, 1976 DOA: 07/07/2022     1 DOS: the patient was seen and examined on 07/09/2022   Brief hospital course: 46 y.o. female with medical history significant of hypertension, ESRD on HD (MWF), PAD, blindness, bilateral transmetatarsal amputation and tobacco abuse who presents to the emergency department due to lower abdominal/pelvic pain which started on Tuesday afternoon (7/11).  Patient states that after dialysis on Monday, she started to have a slow bleed from the fistula site of left arm, on the following day (Tuesday), she developed an abdominal pain which was of moderate intensity and worsens with movement, there was no known alleviating factor.  She saw her PCP who started her on an antibiotic for UTI, but she receives her medications via mail, so she has not received the medication yet.  She states that she missed dialysis on Wednesday due to severity of abdominal pain.  Patient states that she gets short of breath sometimes during dialysis and supplemental oxygen via [place] at 2 LPM was initially given with improvement in O2 sat.  She denies fever, chills, chest pain, headache, lightheadedness.    Assessment and Plan: Symptomatic anemia Acute blood loss anemia H/H 6.7/21.4 (this was 7.3/25.9 on 06/18/2022) Given total 2 units PRBCs this visit with Hgb up to 7.5 Cont to follow CBC trends  Sepsis secondary to possible left sided pyelonephritis and MRSA bacteremia Patient was febrile, tachycardic and tachypneic (met SIRS criteria), CT abdomen and pelvis was suggestive of left renal infection. Blood cx pos for MRSA. Now on vanc ID consulted, thus far recs to continue rocephin and vanc See below, ulceration noted at fistula site F/u on repeat blood cx 2d echo with some calcifications, no florid vegetations noted.  Will need TEE. Have discussed with Cardiology   ESRD on HD (MWF) Nephrology following Ulceration at fistula  site Nephrology discussed with Vascular Surgery. 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 Would resume when hgb stable Previously seen and evaluated by Vascular Surgery CT findings mention of occlusion of prox SMA with distal reconstitution of flow, noted to be similar to prior exam.   Prolonged QT interval QTc 502 ms Avoid QT prolonging drugs Magnesium level was 2.1 Cont to monitor on tele   Pancreatic cyst CT abdomen and pelvis showed cystic lesion of the tail of the pancreas measuring 17 mm Nonemergent contrast-enhanced MRCP was recommended Elevated BNP (chronic) Consider ordering when more stable   Essential hypertension Continue Coreg  GERD Continue Protonix   Tobacco use disorder Patient counseled on tobacco abuse cessation  Coronary calcification -Recent CTA chest reviewed with findings of 4 vessel coronary calcification -Ordered and reviewed 2d echo, normal LVEF with no WMA  Abd pain -CT abd reviewed. 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 Ordered and reviewed abd xray, unremarkable      Subjective: Still complaining of lower quadrant abd discomfort  Physical Exam: Vitals:   07/09/22 0803 07/09/22 1300 07/09/22 1430 07/09/22 1445  BP:  (!) 153/114 (!) 148/101 125/71  Pulse:  91 91   Resp:  20 18 18  Temp:  98.6 F (37 C) 97.9 F (36.6 C)   TempSrc:  Oral Oral   SpO2:  100% 100%   Weight: 100.9 kg  100.9 kg   Height: 5' 4" (1.626 m)      General exam: Conversant, in no acute  distress Respiratory system: normal chest rise, clear, no audible wheezing Cardiovascular system: regular rhythm, s1-s2 Gastrointestinal system: Nondistended, nontender, pos BS Central nervous system: No seizures, no tremors Extremities: No cyanosis, no joint deformities Skin: No rashes, no pallor Psychiatry: Affect normal // no auditory hallucinations   Data Reviewed:  Labs reviewed: Na 139, K 4.1, Cr 9.15, WBC 10.2, Hgb 7.5   Family Communication: Pt in room, family not at bedside  Disposition: Status is: Inpatient Remains inpatient appropriate because: Severity of illness  Planned Discharge Destination: Home    Author: Stephen Chiu, MD 07/09/2022 3:08 PM  For on call review [email or web address].  

## 2022-07-09 NOTE — Progress Notes (Signed)
[facility] Progress Note   Subjective:   Seen at initiation of HD - ulcerated area over AVF. She's blind and unaware - reports prolonged bleeding from AVF lately.  Blood cultures + MRSA, ID consulting  Objective Vitals:   07/08/22 2028 07/09/22 0510 07/09/22 0803 07/09/22 1300  BP: (!) 160/108 (!) 103/58  (!) 153/114  Pulse: (!) 103 86  91  Resp: 19 19  20   Temp: 98.3 F (36.8 C) 98 F (36.7 C)  98.6 F (37 C)  TempSrc: Oral   Oral  SpO2: 97% 100%  100%  Weight:   100.9 kg   Height:   5\' 4"  (1.626 m)    Physical Exam General: chronically ill but nontoxic appearing Heart: RRR Lungs:clear ant Abdomen: soft, obese Extremities:no edema Dialysis Access: LUE AVF 71mm ulcerated area with active oozing noted  Additional Objective Labs: Basic Metabolic Panel: Recent Labs  Lab 07/07/22 2320 07/08/22 0936 07/09/22 0521  NA 138 136 139  K 3.7 3.9 4.1  CL 97* 96* 98  CO2 27 27 27   GLUCOSE 123* 136* 115*  BUN 37* 44* 52*  CREATININE 7.34* 8.20* 9.15*  CALCIUM 9.1 8.9 9.5  PHOS  --  5.2*  --    Liver Function Tests: Recent Labs  Lab 07/07/22 1108 07/08/22 0936 07/09/22 0521  AST 24 17 17   ALT 21 18 18   ALKPHOS 59 55 62  BILITOT 1.2 1.4* 2.7*  PROT 6.1* 5.9* 6.2*  ALBUMIN 3.0* 2.8* 2.9*   Recent Labs  Lab 07/07/22 1108  LIPASE 18   CBC: Recent Labs  Lab 07/07/22 1215 07/07/22 2320 07/08/22 0936 07/09/22 0521  WBC 6.7 8.4 9.6 10.2  NEUTROABS 5.9 7.6  --   --   HGB 6.0* 6.7* 6.1* 7.5*  HCT 19.5* 21.4* 19.6* 23.6*  MCV 91.1 90.7 89.9 90.1  PLT 103* 125* 97* 95*   Blood Culture    Component Value Date/Time   SDES  07/08/2022 0936    RIGHT ANTECUBITAL BOTTLES DRAWN AEROBIC AND ANAEROBIC Performed at [facility], [address] [place], [place] [address]    SDES  07/08/2022 [phone]    BLOOD RIGHT HAND BOTTLES DRAWN AEROBIC AND ANAEROBIC   SPECREQUEST  07/08/2022 0936    Blood Culture results may not be optimal due to an excessive volume  of blood received in culture bottles Performed at [facility], [address], [place], [place] [address]    [facility] Blood Culture adequate volume 07/08/2022 0936   CULT GRAM POSITIVE COCCI 07/08/2022 0936   CULT  07/08/2022 0936    NO GROWTH < 24 HOURS Performed at [facility], [address], [place], [place] [address]    REPTSTATUS PENDING 07/08/2022 0936   REPTSTATUS PENDING 07/08/2022 0936    Cardiac Enzymes: No results for input(s): "CKTOTAL", "CKMB", "CKMBINDEX", "TROPONINI" in the last 168 hours. CBG: No results for input(s): "GLUCAP" in the last 168 hours. Iron Studies:  Recent Labs    07/09/22 0521  IRON 17*  TIBC 162*  FERRITIN 1,321*   @lablastinr3 @ Studies/Results: DG Knee 1-2 Views Left  Result Date: 07/09/2022 CLINICAL DATA:  Left knee pain, tenderness to touch. EXAM: LEFT KNEE - 1-2 VIEW COMPARISON:  None Available. FINDINGS: Portable AP and lateral views obtained. The bones are under mineralized. Mild tricompartmental peripheral spurring most prominent in the patellofemoral compartment. Subchondral cystic change in the inferior patella. No fracture. No erosion, bony destruction or evidence of focal lesion. Prepatellar soft tissue edema. No soft tissue  gas, no radiopaque foreign body. Advanced for age vascular calcifications. IMPRESSION: 1. Prepatellar soft tissue edema. 2. Osteopenia/osteoporosis with mild tricompartmental degenerative change, most prominent in the patellofemoral compartment. 3. Advanced for age vascular calcifications. Electronically Signed   By: Keith Rake M.D.   On: 07/09/2022 12:51   DG Abd 1 View  Result Date: 07/09/2022 CLINICAL DATA:  Abdominal pain. EXAM: ABDOMEN - 1 VIEW COMPARISON:  07/07/2022 CT and prior studies FINDINGS: The bowel gas pattern is unremarkable. No dilated bowel loops are identified radiographically. Contrast within the rectum is noted. No acute bony abnormalities are noted. IMPRESSION: Unremarkable bowel gas  pattern. Electronically Signed   By: Margarette Canada M.D.   On: 07/09/2022 12:26   ECHOCARDIOGRAM COMPLETE  Result Date: 07/08/2022    ECHOCARDIOGRAM REPORT   Patient Name:   Rebekah Burton Date of Exam: 07/08/2022 Medical Rec #:  [id number]    Height:       64.0 in Accession #:    [id number]   Weight:       222.0 lb Date of Birth:  04/17/1976    BSA:          2.045 m Patient Age:    46 years     BP:           126/72 mmHg Patient Gender: F            HR:           87 bpm. Exam Location:  [facility] Procedure: 2D Echo, Cardiac Doppler and Color Doppler Indications:    CAD  History:        Patient has no prior history of Echocardiogram examinations.                 PAD; Risk Factors:Hypertension and Current Smoker.  Sonographer:    Wenda Low Referring Phys: [place]  1. Left ventricular ejection fraction, by estimation, is 65 to 70%. The left ventricle has normal function. The left ventricle has no regional wall motion abnormalities. There is severe concentric left ventricular hypertrophy. Left ventricular diastolic  parameters are consistent with Grade II diastolic dysfunction (pseudonormalization). Elevated left ventricular end-diastolic pressure.  2. Right ventricular systolic function is normal. The right ventricular size is normal. There is moderately elevated pulmonary artery systolic pressure. The estimated right ventricular systolic pressure is 11.5 mmHg.  3. Left atrial size was severely dilated.  4. Right atrial size was mildly dilated.  5. The mitral valve is degenerative. Heavy calcification of the chordae tendinae off the anterior and posterior mitral valve leaflets. Severely decreased mobility of the posterior mitral valve leaflet. Moderate mitral annular calcification. Trivial mitral valve regurgitation. Moderate mitral valve stenosis. MV peak gradient, 13.2 mmHg. The mean mitral valve gradient is 5.0 mmHg. MVA (VTI) 2.02cm2.     Trivial mitral valve regurgitation. Moderate  mitral stenosis. Moderate mitral annular calcification.  6. The aortic valve is tricuspid. Aortic valve regurgitation is not visualized. Aortic valve sclerosis/calcification is present, without any evidence of aortic stenosis. Aortic valve area, by VTI measures 2.30 cm. Aortic valve mean gradient measures 7.5 mmHg. Aortic valve Vmax measures 2.09 m/s.  7. The inferior vena cava is dilated in size with >50% respiratory variability, suggesting right atrial pressure of 8 mmHg. FINDINGS  Left Ventricle: Left ventricular ejection fraction, by estimation, is 65 to 70%. The left ventricle has normal function. The left ventricle has no regional wall motion abnormalities. The left ventricular internal cavity size was normal in  size. There is  severe concentric left ventricular hypertrophy. Left ventricular diastolic parameters are consistent with Grade II diastolic dysfunction (pseudonormalization). Elevated left ventricular end-diastolic pressure. Right Ventricle: The right ventricular size is normal. No increase in right ventricular wall thickness. Right ventricular systolic function is normal. There is moderately elevated pulmonary artery systolic pressure. The tricuspid regurgitant velocity is 3.56 m/s, and with an assumed right atrial pressure of 8 mmHg, the estimated right ventricular systolic pressure is 23.7 mmHg. Left Atrium: Left atrial size was severely dilated. Right Atrium: Right atrial size was mildly dilated. Pericardium: Trivial pericardial effusion is present. The pericardial effusion is circumferential. Mitral Valve: The mitral valve is degenerative in appearance. There is Heavy calcification of the chordae tendinae off the anterior and posterior mitral valve leaflets of the mitral valve leaflet(s). Severely decreased mobility of the mitral valve leaflets. Moderate mitral annular calcification. Trivial mitral valve regurgitation. Moderate mitral valve stenosis. MV peak gradient, 13.2 mmHg. The mean mitral  valve gradient is 5.0 mmHg. Tricuspid Valve: The tricuspid valve is normal in structure. Tricuspid valve regurgitation is mild . No evidence of tricuspid stenosis. Aortic Valve: The aortic valve is tricuspid. Aortic valve regurgitation is not visualized. Aortic valve sclerosis/calcification is present, without any evidence of aortic stenosis. Aortic valve mean gradient measures 7.5 mmHg. Aortic valve peak gradient measures 17.5 mmHg. Aortic valve area, by VTI measures 2.30 cm. Pulmonic Valve: The pulmonic valve was normal in structure. Pulmonic valve regurgitation is trivial. No evidence of pulmonic stenosis. Aorta: The aortic root is normal in size and structure. Venous: The inferior vena cava is dilated in size with greater than 50% respiratory variability, suggesting right atrial pressure of 8 mmHg. IAS/Shunts: No atrial level shunt detected by color flow Doppler.  LEFT VENTRICLE PLAX 2D LVIDd:         3.50 cm     Diastology LVIDs:         2.20 cm     LV e' medial:    6.42 cm/s LV PW:         2.10 cm     LV E/e' medial:  23.4 LV IVS:        1.80 cm     LV e' lateral:   7.94 cm/s LVOT diam:     2.00 cm     LV E/e' lateral: 18.9 LV SV:         88 LV SV Index:   43 LVOT Area:     3.14 cm  LV Volumes (MOD) LV vol d, MOD A2C: 57.8 ml LV vol d, MOD A4C: 56.8 ml LV vol s, MOD A2C: 17.3 ml LV vol s, MOD A4C: 18.4 ml LV SV MOD A2C:     40.5 ml LV SV MOD A4C:     56.8 ml LV SV MOD BP:      39.1 ml RIGHT VENTRICLE RV Basal diam:  3.05 cm RV Mid diam:    3.00 cm RV S prime:     14.90 cm/s TAPSE (M-mode): 2.9 cm LEFT ATRIUM              Index        RIGHT ATRIUM           Index LA diam:        5.30 cm  2.59 cm/m   RA Area:     20.70 cm LA Vol (A2C):   139.0 ml 67.97 ml/m  RA Volume:   55.80 ml  27.28 ml/m LA Vol (  A4C):   97.8 ml  47.82 ml/m LA Biplane Vol: 118.0 ml 57.70 ml/m  AORTIC VALVE                     PULMONIC VALVE AV Area (Vmax):    2.15 cm      PV Vmax:       0.65 m/s AV Area (Vmean):   2.51 cm      PV  Peak grad:  1.7 mmHg AV Area (VTI):     2.30 cm AV Vmax:           209.00 cm/s AV Vmean:          118.500 cm/s AV VTI:            0.381 m AV Peak Grad:      17.5 mmHg AV Mean Grad:      7.5 mmHg LVOT Vmax:         143.00 cm/s LVOT Vmean:        94.800 cm/s LVOT VTI:          0.279 m LVOT/AV VTI ratio: 0.73  AORTA Ao Root diam: 2.80 cm MITRAL VALVE                TRICUSPID VALVE MV Area (PHT): 2.83 cm     TR Peak grad:   50.7 mmHg MV Area VTI:   2.02 cm     TR Vmax:        356.00 cm/s MV Peak grad:  13.2 mmHg MV Mean grad:  5.0 mmHg     SHUNTS MV Vmax:       1.82 m/s     Systemic VTI:  0.28 m MV Vmean:      101.8 cm/s   Systemic Diam: 2.00 cm MV Decel Time: 268 msec MV E velocity: 150.00 cm/s MV A velocity: 101.00 cm/s MV E/A ratio:  1.49 Fransico Him MD Electronically signed by Fransico Him MD Signature Date/Time: 07/08/2022/11:35:57 AM  s    Final    US Pelvis Complete  Result Date: 07/07/2022 CLINICAL DATA:  Left lower quadrant pain. Focal abnormality seen on CT. EXAM: TRANSABDOMINAL AND TRANSVAGINAL ULTRASOUND OF PELVIS DOPPLER ULTRASOUND OF OVARIES TECHNIQUE: Both transabdominal and transvaginal ultrasound examinations of the pelvis were performed. Transabdominal technique was performed for global imaging of the pelvis including uterus, ovaries, adnexal regions, and pelvic cul-de-sac. It was necessary to proceed with endovaginal exam following the transabdominal exam to visualize the ovaries/adnexa. Color and duplex Doppler ultrasound was utilized to evaluate blood flow to the ovaries. COMPARISON:  CT today FINDINGS: Uterus Measurements: Prior hysterectomy Endometrium Thickness: Prior hysterectomy. Right ovary Measurements: Not visualized.  No adnexal mass seen. Left ovary Measurements: 3.1 x 1.9 x 2.1 cm = volume: 6.5 mL. Small follicles noted within the left ovary. Color flow visualized within the left ovary. Pulsed Doppler evaluation of the left ovary demonstrates color flow visualized. Venous flow is  also documented. Unable to document/visualize arterial flow. Other findings Large amount of ascites. IMPRESSION: Left ovary is visualized. No ovarian/adnexal mass. Color flow and venous waveforms documented in the left ovary. Unable to document arterial waveform, but torsion unlikely given the venous and color flow noted. Prior hysterectomy. Large volume ascites. Electronically Signed   By: Rolm Baptise M.D.   On: 07/07/2022 18:21   US Transvaginal Non-OB  Result Date: 07/07/2022 CLINICAL DATA:  Left lower quadrant pain. Focal abnormality seen on CT. EXAM: TRANSABDOMINAL AND TRANSVAGINAL ULTRASOUND OF PELVIS DOPPLER ULTRASOUND  OF OVARIES TECHNIQUE: Both transabdominal and transvaginal ultrasound examinations of the pelvis were performed. Transabdominal technique was performed for global imaging of the pelvis including uterus, ovaries, adnexal regions, and pelvic cul-de-sac. It was necessary to proceed with endovaginal exam following the transabdominal exam to visualize the ovaries/adnexa. Color and duplex Doppler ultrasound was utilized to evaluate blood flow to the ovaries. COMPARISON:  CT today FINDINGS: Uterus Measurements: Prior hysterectomy Endometrium Thickness: Prior hysterectomy. Right ovary Measurements: Not visualized.  No adnexal mass seen. Left ovary Measurements: 3.1 x 1.9 x 2.1 cm = volume: 6.5 mL. Small follicles noted within the left ovary. Color flow visualized within the left ovary. Pulsed Doppler evaluation of the left ovary demonstrates color flow visualized. Venous flow is also documented. Unable to document/visualize arterial flow. Other findings Large amount of ascites. IMPRESSION: Left ovary is visualized. No ovarian/adnexal mass. Color flow and venous waveforms documented in the left ovary. Unable to document arterial waveform, but torsion unlikely given the venous and color flow noted. Prior hysterectomy. Large volume ascites. Electronically Signed   By: Rolm Baptise M.D.   On:  07/07/2022 18:21   [place] Art/Ven Flow Abd Pelv Doppler  Result Date: 07/07/2022 CLINICAL DATA:  Left lower quadrant pain. Focal abnormality seen on CT. EXAM: TRANSABDOMINAL AND TRANSVAGINAL ULTRASOUND OF PELVIS DOPPLER ULTRASOUND OF OVARIES TECHNIQUE: Both transabdominal and transvaginal ultrasound examinations of the pelvis were performed. Transabdominal technique was performed for global imaging of the pelvis including uterus, ovaries, adnexal regions, and pelvic cul-de-sac. It was necessary to proceed with endovaginal exam following the transabdominal exam to visualize the ovaries/adnexa. Color and duplex Doppler ultrasound was utilized to evaluate blood flow to the ovaries. COMPARISON:  CT today FINDINGS: Uterus Measurements: Prior hysterectomy Endometrium Thickness: Prior hysterectomy. Right ovary Measurements: Not visualized.  No adnexal mass seen. Left ovary Measurements: 3.1 x 1.9 x 2.1 cm = volume: 6.5 mL. Small follicles noted within the left ovary. Color flow visualized within the left ovary. Pulsed Doppler evaluation of the left ovary demonstrates color flow visualized. Venous flow is also documented. Unable to document/visualize arterial flow. Other findings Large amount of ascites. IMPRESSION: Left ovary is visualized. No ovarian/adnexal mass. Color flow and venous waveforms documented in the left ovary. Unable to document arterial waveform, but torsion unlikely given the venous and color flow noted. Prior hysterectomy. Large volume ascites. Electronically Signed   By: Rolm Baptise M.D.   On: 07/07/2022 18:21   CT Abdomen Pelvis W Contrast  Result Date: 07/07/2022 CLINICAL DATA:  Abdominal pain EXAM: CT ABDOMEN AND PELVIS WITH CONTRAST TECHNIQUE: Multidetector CT imaging of the abdomen and pelvis was performed using the standard protocol following bolus administration of intravenous contrast. RADIATION DOSE REDUCTION: This exam was performed according to the departmental dose-optimization program  which includes automated exposure control, adjustment of the mA and/or kV according to patient size and/or use of iterative reconstruction technique. CONTRAST:  133mL OMNIPAQUE IOHEXOL 300 MG/ML  SOLN COMPARISON:  Chest CT dated June 17, 2022 FINDINGS: Lower chest: Bibasilar atelectasis. Small right pleural effusion. Cardiomegaly. Coronary artery calcifications. Hepatobiliary: No focal liver abnormality is seen. Gallstones with no gallbladder wall thickening. No biliary ductal dilation. Pancreas: No pancreatic ductal dilation. Cystic lesion of the tail of the pancreas measuring 17 mm on series 2, image 22. Spleen: Normal in size without focal abnormality. Adrenals/Urinary Tract: Bilateral adrenal glands are unremarkable. No hydronephrosis. Heterogeneous area of the interpolar region of the left kidney. Bilateral low-attenuation renal lesions, largest are compatible with simple cysts, others  are too small to completely characterize. Bladder is decompressed. Stomach/Bowel: Normal appearance of the stomach. Mildly dilated loops of distal small bowel with no definite transition point. No bowel wall thickening or inflammatory change. Normal appendix. Vascular/Lymphatic: Severe aortic atherosclerotic disease. 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 Occlusion of the partially visualized bilateral superficial femoral arteries. Pathologically enlarged lymph nodes seen in the abdomen or pelvis. Reproductive: Uterus is unremarkable. Indeterminate enhancing focus of the left pelvis measuring 2.2 cm on series 2, image 65. Other: Diffuse soft tissue anasarca. Numerous varices of the anterior abdominal wall. Small volume abdominal ascites. Musculoskeletal: Diffuse osseous sclerosis, likely sequela of chronic renal disease. IMPRESSION: 1. Numerous mildly dilated loops of small bowel with no definite transition point, concerning  for early or partial small bowel obstruction. 2. Focal heterogeneous area of the interpolar region of the left kidney, possibly due to infection, although renal mass can not be excluded. Correlate with urinalysis and recommend follow-up renal protocol CT in 3 months to ensure resolution. 3. Small right pleural effusion and small volume abdominal ascites. 4. Cystic lesion of the tail of the pancreas measuring 17 mm. Recommend nonemergent contrast-enhanced MRCP for further evaluation. 5. Indeterminate enhancing focus of the left pelvis measuring 2.2 cm, likely the left ovary, although evaluation is limited due to abdominal ascites. Finding could be further evaluated with pelvic ultrasound. 6. Severe aortic Atherosclerosis (ICD10-I70.0). 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 Occlusion of the partially visualized bilateral superficial femoral arteries. 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 Electronically Signed   By: Yetta Glassman M.D.   On: 07/07/2022 16:20   Medications:  sodium chloride     cefTRIAXone (ROCEPHIN)  IV 1 g (07/09/22 0604)    calcitRIOL  2.5 mcg Oral Q M,W,F-HD   Chlorhexidine Gluconate Cloth  6 each Topical Q0600   Chlorhexidine Gluconate Cloth  6 each Topical Q0600   cinacalcet  90 mg Oral Q M,W,F-HD   feeding supplement  237 mL Oral BID BM   pantoprazole  40 mg Oral Daily   polyethylene glycol  17 g Oral Daily    Assessment/Recommendations:    ESRD:  -outpatient orders: [facility].  4 hours 15 minutes.  MWF.  EDW 102 kg.  Revaclear 300.  400/500.  2K, 2.5 Cal.  Meds: Calcitriol 2.5 mcg 3 times weekly, Sensipar 90 mg 3 times weekly, Mircera 200 mg every 2 weeks (last dose on Monday 7/10).  No heparin -s/p urgent HD 7/13 for hypervolemia -HD today (off schedule), can resume MWF thereafter -transfer to [facility] for ulcerated AVF - will do HD here prior   Sepsis possible related to left pyelonephritis,  +MRSA bacteremia -ID consulting - vanc added, cont CTX; repeating cx, TEE   Abdominal pain, early/partial SBO -mgmt per primary service   Volume/ hypertension: EDW 102kg. Attempt to achieve EDW as tolerated   Anemia of Chronic Kidney Disease, acute anemia: Hemoglobin improved to 7.5 s/p pRBC -FOBT negative, transfused for Hgb <7 -iron sat 10% but hold IV iron in setting of bacteremia -last dose of mircera per outpatient unit was 7/10   Secondary Hyperparathyroidism/Hyperphosphatemia: resumed calcitriol and sensipar, started renvela (patient reports she was supposed to start this since lanthanum no longer covered by insurance)    Vascular access: ulcerated area noted, need VVS consult; plan to transfer to [facility].  Additional recommendations: - Dose all meds for creatinine clearance < 10 ml/min  - Unless absolutely necessary, no MRIs with gadolinium.  - Implement save arm precautions.  Prefer needle sticks in the dorsum of the hands or wrists.  No blood pressure measurements in arm. - If blood transfusion is requested during hemodialysis sessions, please alert [place] prior to the session.  - If a hemodialysis catheter line culture is requested, please alert [place] as only hemodialysis nurses are able to collect those specimens.    Recommendations were discussed with the primary team.   Will be monitoring patient remotely over the weekend. Please call on-call nephrologist with any questions/concerns.    Jannifer Hick MD 07/09/2022, 2:36 PM  [facility] Pager: [phone]

## 2022-07-09 NOTE — Progress Notes (Addendum)
ANTICOAGULATION CONSULT NOTE - Initial Consult  Pharmacy Consult for Heparin>>  Heparin placed on HOLD until H/H stabilizes.   Indication: DVT  No Known Allergies  Patient Measurements: Height: 5\' 4"  (162.6 cm) Weight: 100.9 kg (222 lb 7.1 oz) IBW/kg (Calculated) : 54.7 HEPARIN DW (KG): 78.1  Vital Signs: Temp: 97.9 F (36.6 C) (07/15 1430) Temp Source: Oral (07/15 1430) BP: 146/83 (07/15 1715) Pulse Rate: 91 (07/15 1430)  Labs: Recent Labs    07/07/22 2320 07/08/22 0936 07/09/22 0521  HGB 6.7* 6.1* 7.5*  HCT 21.4* 19.6* 23.6*  PLT 125* 97* 95*  CREATININE 7.34* 8.20* 9.15*    Estimated Creatinine Clearance: 9.2 mL/min (A) (by C-G formula based on SCr of 9.15 mg/dL (H)).   Medical History: Past Medical History:  Diagnosis Date   Anemia    Blind    Blind    ESRD (end stage renal disease) ([place])    Foot ulceration ([place]) 12/2018   Hypertension    PAD (peripheral artery disease) ([facility])     Medications:  Medications Prior to Admission  Medication Sig Dispense Refill Last Dose   b complex-vitamin c-folic acid (NEPHRO-VITE) 0.8 MG TABS tablet Take 1 tablet by mouth every Monday, Wednesday, and Friday with hemodialysis. 30 tablet 3 07/06/2022   carvedilol (COREG) 25 MG tablet Take 1 tablet (25 mg total) by mouth 2 (two) times daily. 60 tablet 3 07/07/2022 at 0800   cloNIDine (CATAPRES) 0.3 MG tablet Take 1 tablet (0.3 mg total) by mouth 3 (three) times daily. 90 tablet 3 07/07/2022   ibuprofen (ADVIL) 600 MG tablet Take 1 tablet (600 mg total) by mouth 3 (three) times daily. (Patient taking differently: Take 600 mg by mouth every 8 (eight) hours as needed (pain).) 30 tablet 0 UNK   lanthanum (FOSRENOL) 1000 MG chewable tablet Chew 1 tablet (1,000 mg total) by mouth 3 (three) times daily with meals. 90 tablet 3 07/07/2022   pantoprazole (PROTONIX) 40 MG tablet Take 1 tablet (40 mg total) by mouth daily. 30 tablet 3 07/07/2022   senna (SENOKOT) 8.6 MG TABS tablet Take 1  tablet (8.6 mg total) by mouth 2 (two) times daily. 120 tablet 0 07/07/2022   traZODone (DESYREL) 150 MG tablet Take 150 mg by mouth at bedtime.   07/06/2022   APIXABAN (ELIQUIS) VTE STARTER PACK (10MG  AND 5MG ) Take as directed on package: start with two-5mg  tablets twice daily for 7 days. On day 8, switch to one-5mg  tablet twice daily. (Patient not taking: Reported on 07/07/2022) 1 each 0 Not Taking    Assessment: 46 y.o. female with medical history significant of hypertension, ESRD on HD (MWF), PAD, blindness, bilateral transmetatarsal amputation and tobacco abuse who presents to the emergency department due to lower abdominal/pelvic pain which started on Tuesday afternoon (7/11).  Patient states that after dialysis on Monday, she started to have a slow bleed from the fistula site of left arm, on the following day (Tuesday), she developed an abdominal pain which was of moderate intensity and worsens with movement, there was no known alleviating factor. Asked to initiate Heparin for suspected DVT. Goal of Therapy:  Heparin level 0.3-0.7 units/ml Monitor platelets by anticoagulation protocol: Yes   Plan:  Heparin bolus 2000 units x 1 (adjusted due to CBC and bleeding at catheter site) then infusion at 1150 units/hr Keep HL on lower end of range due to some noted bleeding and H/H, PLTs Check HL in 6 hours Monitor for s/sx bleeding complications  Hart Robinsons A 07/09/2022,5:37  PM   

## 2022-07-09 NOTE — Progress Notes (Addendum)
Patient returned from dialysis, c/o pain to left knee, abdomen and back rating 10/10. Fistula to left arm with small amount of blood. Per dialysis nurse it took about 45 mins to stop bleeding. 1.8l liters were removed. Alert and oriented, stable condition other than elevated BP which may be related to pain. Coreg and pain meds given, will continue to monitor.

## 2022-07-09 NOTE — Progress Notes (Signed)
When EMS arrived patient BP still elevated and states the pain med helped "a little" BP 181/117. Notified MD to get prn BP med before transport. New order given for hydralazine 10mg . Med administered and patient left with [facility] via stretcher at 2225. Report called to Philippa Sicks on [facility].

## 2022-07-10 ENCOUNTER — Encounter (HOSPITAL_COMMUNITY): Payer: Self-pay | Admitting: Internal Medicine

## 2022-07-10 DIAGNOSIS — T82898A Other specified complication of vascular prosthetic devices, implants and grafts, initial encounter: Secondary | ICD-10-CM | POA: Diagnosis not present

## 2022-07-10 DIAGNOSIS — D62 Acute posthemorrhagic anemia: Secondary | ICD-10-CM | POA: Diagnosis not present

## 2022-07-10 DIAGNOSIS — N10 Acute tubulo-interstitial nephritis: Secondary | ICD-10-CM | POA: Diagnosis not present

## 2022-07-10 DIAGNOSIS — D649 Anemia, unspecified: Secondary | ICD-10-CM | POA: Diagnosis not present

## 2022-07-10 DIAGNOSIS — N185 Chronic kidney disease, stage 5: Secondary | ICD-10-CM | POA: Diagnosis not present

## 2022-07-10 LAB — COMPREHENSIVE METABOLIC PANEL
ALT: 20 U/L (ref 0–44)
AST: 24 U/L (ref 15–41)
Albumin: 2.8 g/dL — ABNORMAL LOW (ref 3.5–5.0)
Alkaline Phosphatase: 76 U/L (ref 38–126)
Anion gap: 15 (ref 5–15)
BUN: 33 mg/dL — ABNORMAL HIGH (ref 6–20)
CO2: 24 mmol/L (ref 22–32)
Calcium: 9.2 mg/dL (ref 8.9–10.3)
Chloride: 94 mmol/L — ABNORMAL LOW (ref 98–111)
Creatinine, Ser: 6.37 mg/dL — ABNORMAL HIGH (ref 0.44–1.00)
GFR, Estimated: 8 mL/min — ABNORMAL LOW (ref >60)
Glucose, Bld: 131 mg/dL — ABNORMAL HIGH (ref 70–99)
Potassium: 3.5 mmol/L (ref 3.5–5.1)
Sodium: 133 mmol/L — ABNORMAL LOW (ref 135–145)
Total Bilirubin: 3.6 mg/dL — ABNORMAL HIGH (ref 0.3–1.2)
Total Protein: 6.1 g/dL — ABNORMAL LOW (ref 6.5–8.1)

## 2022-07-10 LAB — HEPATITIS B SURFACE ANTIBODY, QUANTITATIVE: Hep B S AB Quant (Post): 11.9 m[IU]/mL (ref >9.9)

## 2022-07-10 LAB — CBC
HCT: 22 % — ABNORMAL LOW (ref 36.0–46.0)
HCT: 23 % — ABNORMAL LOW (ref 36.0–46.0)
Hemoglobin: 7.1 g/dL — ABNORMAL LOW (ref 12.0–15.0)
Hemoglobin: 7.4 g/dL — ABNORMAL LOW (ref 12.0–15.0)
MCH: 28.3 pg (ref 26.0–34.0)
MCH: 28.1 pg (ref 26.0–34.0)
MCHC: 32.3 g/dL (ref 30.0–36.0)
MCHC: 32.2 g/dL (ref 30.0–36.0)
MCV: 87.6 fL (ref 80.0–100.0)
MCV: 87.5 fL (ref 80.0–100.0)
Platelets: 106 10*3/uL — ABNORMAL LOW (ref 150–400)
Platelets: 96 10*3/uL — ABNORMAL LOW (ref 150–400)
RBC: 2.51 MIL/uL — ABNORMAL LOW (ref 3.87–5.11)
RBC: 2.63 MIL/uL — ABNORMAL LOW (ref 3.87–5.11)
RDW: 18.4 % — ABNORMAL HIGH (ref 11.5–15.5)
RDW: 18.5 % — ABNORMAL HIGH (ref 11.5–15.5)
WBC: 10.8 10*3/uL — ABNORMAL HIGH (ref 4.0–10.5)
WBC: 10.5 10*3/uL (ref 4.0–10.5)
nRBC: 0.2 % (ref 0.0–0.2)
nRBC: 0 % (ref 0.0–0.2)

## 2022-07-10 LAB — RENAL FUNCTION PANEL
Albumin: 2.8 g/dL — ABNORMAL LOW (ref 3.5–5.0)
Anion gap: 15 (ref 5–15)
BUN: 37 mg/dL — ABNORMAL HIGH (ref 6–20)
CO2: 26 mmol/L (ref 22–32)
Calcium: 9.6 mg/dL (ref 8.9–10.3)
Chloride: 95 mmol/L — ABNORMAL LOW (ref 98–111)
Creatinine, Ser: 6.86 mg/dL — ABNORMAL HIGH (ref 0.44–1.00)
GFR, Estimated: 7 mL/min — ABNORMAL LOW (ref >60)
Glucose, Bld: 129 mg/dL — ABNORMAL HIGH (ref 70–99)
Phosphorus: 4.2 mg/dL (ref 2.5–4.6)
Potassium: 3.7 mmol/L (ref 3.5–5.1)
Sodium: 136 mmol/L (ref 135–145)

## 2022-07-10 MED ORDER — VANCOMYCIN HCL 750 MG/150ML IV SOLN
750.0000 mg | Freq: Once | INTRAVENOUS | Status: AC
  Administered 2022-07-10: 750 mg via INTRAVENOUS
  Filled 2022-07-10: qty 150

## 2022-07-10 MED ORDER — VANCOMYCIN HCL 750 MG/150ML IV SOLN
750.0000 mg | INTRAVENOUS | Status: DC
  Filled 2022-07-10: qty 150

## 2022-07-10 NOTE — Progress Notes (Signed)
[facility] Progress Note   Subjective:   c/o diffuse pain.  No dyspnea.  Tolerated HD fine yesterday.   Objective Vitals:   07/09/22 2210 07/09/22 2318 07/10/22 0409 07/10/22 0801  BP: (!) 184/102 (!) 161/118 (!) 149/90   Pulse:  98 100 (!) 103  Resp:  18 17 15   Temp:  98.2 F (36.8 C) 98.6 F (37 C) 98.1 F (36.7 C)  TempSrc:  Oral Oral Oral  SpO2:  100% 93% 98%  Weight:  99.5 kg    Height:  5\' 4"  (1.626 m)     Physical Exam General: chronically ill but nontoxic appearing Heart: RRR Lungs:clear ant Abdomen: soft, obese Extremities:no edema Dialysis Access: dressings from cannulation yesterday removed - no bleeding, I didn't uncover dressing from ulcerated area  Additional Objective Labs: Basic Metabolic Panel: Recent Labs  Lab 07/08/22 0936 07/09/22 0521 07/10/22 1025  NA 136 139 136  K 3.9 4.1 3.7  CL 96* 98 95*  CO2 27 27 26   GLUCOSE 136* 115* 129*  BUN 44* 52* 37*  CREATININE 8.20* 9.15* 6.86*  CALCIUM 8.9 9.5 9.6  PHOS 5.2*  --  4.2    Liver Function Tests: Recent Labs  Lab 07/07/22 1108 07/08/22 0936 07/09/22 0521 07/10/22 1025  AST 24 17 17   --   ALT 21 18 18   --   ALKPHOS 59 55 62  --   BILITOT 1.2 1.4* 2.7*  --   PROT 6.1* 5.9* 6.2*  --   ALBUMIN 3.0* 2.8* 2.9* 2.8*    Recent Labs  Lab 07/07/22 1108  LIPASE 18    CBC: Recent Labs  Lab 07/07/22 1215 07/07/22 2320 07/08/22 0936 07/09/22 0521 07/10/22 1025  WBC 6.7 8.4 9.6 10.2 10.5  NEUTROABS 5.9 7.6  --   --   --   HGB 6.0* 6.7* 6.1* 7.5* 7.4*  HCT 19.5* 21.4* 19.6* 23.6* 23.0*  MCV 91.1 90.7 89.9 90.1 87.5  PLT 103* 125* 97* 95* 96*    Blood Culture    Component Value Date/Time   SDES  07/08/2022 0936    RIGHT ANTECUBITAL BOTTLES DRAWN AEROBIC AND ANAEROBIC Performed at [facility], [address] [place], [place] [address]    SDES  07/08/2022 [phone]    BLOOD RIGHT HAND BOTTLES DRAWN AEROBIC AND ANAEROBIC Performed at [facility], [address], [place], [place] [address]    [facility]  07/08/2022 3428    Blood Culture results may not be optimal due to an excessive volume of blood received in culture bottles Performed at [facility], [address], [place], [place] [address]    [facility]  07/08/2022 5726    Blood Culture adequate volume Performed at [facility], [address], [place], [place] [address]    CULT (A) 07/08/2022 0936    STAPHYLOCOCCUS AUREUS SUSCEPTIBILITIES TO FOLLOW Performed at [facility] [address], [place], [place] [address]    CULT STAPHYLOCOCCUS AUREUS (A) 07/08/2022 0936   REPTSTATUS PENDING 07/08/2022 0936   REPTSTATUS PENDING 07/08/2022 0936    Cardiac Enzymes: No results for input(s): "CKTOTAL", "CKMB", "CKMBINDEX", "TROPONINI" in the last 168 hours. CBG: No results for input(s): "GLUCAP" in the last 168 hours. Iron Studies:  Recent Labs    07/09/22 0521  IRON 17*  TIBC 162*  FERRITIN 1,321*    @lablastinr3 @ Studies/Results: DG Knee 1-2 Views Left  Result Date: 07/09/2022 CLINICAL DATA:  Left knee pain, tenderness to touch. EXAM: LEFT KNEE - 1-2 VIEW  COMPARISON:  None Available. FINDINGS: Portable AP and lateral views obtained. The bones are under mineralized. Mild tricompartmental peripheral spurring most prominent in the patellofemoral compartment. Subchondral cystic change in the inferior patella. No fracture. No erosion, bony destruction or evidence of focal lesion. Prepatellar soft tissue edema. No soft tissue gas, no radiopaque foreign body. Advanced for age vascular calcifications. IMPRESSION: 1. Prepatellar soft tissue edema. 2. Osteopenia/osteoporosis with mild tricompartmental degenerative change, most prominent in the patellofemoral compartment. 3. Advanced for age vascular calcifications. Electronically Signed   By: Keith Rake M.D.   On: 07/09/2022 12:51   DG Abd 1 View  Result Date: 07/09/2022 CLINICAL DATA:  Abdominal pain. EXAM: ABDOMEN - 1 VIEW  COMPARISON:  07/07/2022 CT and prior studies FINDINGS: The bowel gas pattern is unremarkable. No dilated bowel loops are identified radiographically. Contrast within the rectum is noted. No acute bony abnormalities are noted. IMPRESSION: Unremarkable bowel gas pattern. Electronically Signed   By: Margarette Canada M.D.   On: 07/09/2022 12:26   Medications:  sodium chloride     cefTRIAXone (ROCEPHIN)  IV 1 g (07/10/22 0506)    calcitRIOL  2.5 mcg Oral Q M,W,F-HD   carvedilol  25 mg Oral BID   Chlorhexidine Gluconate Cloth  6 each Topical Q0600   Chlorhexidine Gluconate Cloth  6 each Topical Q0600   cinacalcet  90 mg Oral Q M,W,F-HD   feeding supplement  237 mL Oral BID BM   lanthanum  1,000 mg Oral TID WC   multivitamin  1 tablet Oral QHS   pantoprazole  40 mg Oral Daily   polyethylene glycol  17 g Oral Daily    Assessment/Recommendations:    ESRD:  -outpatient orders: [facility].  4 hours 15 minutes.  MWF.  EDW 102 kg.  Revaclear 300.  400/500.  2K, 2.5 Cal.  Meds: Calcitriol 2.5 mcg 3 times weekly, Sensipar 90 mg 3 times weekly, Mircera 200 mg every 2 weeks (last dose on Monday 7/10).  No heparin -s/p urgent HD 7/13 for hypervolemia, then HD 7/15 (off schedule), can resume MWF thereafter   Sepsis, +MRSA bacteremia -ID consulting - vanc added, cont CTX; repeating cx, TEE -avoid placing [facility] tomorrow if AVF not able to be repaired - would need temp line 1st   Abdominal pain, early/partial SBO -mgmt per primary service   Volume/ hypertension: EDW 102kg. Attempt to achieve EDW as tolerated   Anemia of Chronic Kidney Disease, acute anemia: Hemoglobin improved to 7s s/p pRBC -FOBT negative, transfused for Hgb <7 -iron sat 10% but hold IV iron in setting of bacteremia -last dose of mircera per outpatient unit was 7/10   Secondary Hyperparathyroidism/Hyperphosphatemia: on calcitriol and sensipar, started renvela (patient reports she was supposed to start this since lanthanum no longer covered  by insurance)    Vascular access: transferred to [facility] - VVS  consulted, appreciated - plans OR tomorrow for repair if possible.  Given MRSA asked temp line be placed if needed until cultures cleared.    Additional recommendations: - Dose all meds for creatinine clearance < 10 ml/min  - Unless absolutely necessary, no MRIs with gadolinium.  - Implement save arm precautions.  Prefer needle sticks in the dorsum of the hands or wrists.  No blood pressure measurements in arm. - If blood transfusion is requested during hemodialysis sessions, please alert [place] prior to the session.  - If a hemodialysis catheter line culture is requested, please alert [place] as only hemodialysis nurses are able to collect those  specimens.    Recommendations were discussed with the primary team.   Will be monitoring patient remotely over the weekend. Please call on-call nephrologist with any questions/concerns.    Jannifer Hick MD 07/10/2022, 12:00 PM  [facility] Pager: [phone]

## 2022-07-10 NOTE — Progress Notes (Signed)
Progress Note   Patient: Rebekah Burton HDQ:[id number] DOB: Apr 19, 1976 DOA: 07/07/2022     2 DOS: the patient was seen and examined on 07/10/2022   Brief hospital course: 46 y.o. female with medical history significant of hypertension, ESRD on HD (MWF), PAD, blindness, bilateral transmetatarsal amputation and tobacco abuse who presents to the emergency department due to lower abdominal/pelvic pain which started on Tuesday afternoon (7/11).  Patient states that after dialysis on Monday, she started to have a slow bleed from the fistula site of left arm, on the following day (Tuesday), she developed an abdominal pain which was of moderate intensity and worsens with movement, there was no known alleviating factor.  She saw her PCP who started her on an antibiotic for UTI, but she receives her medications via mail, so she has not received the medication yet.  She states that she missed dialysis on Wednesday due to severity of abdominal pain.  Patient states that she gets short of breath sometimes during dialysis and supplemental oxygen via [place] at 2 LPM was initially given with improvement in O2 sat.  She denies fever, chills, chest pain, headache, lightheadedness.    Assessment and Plan: Symptomatic anemia Acute blood loss anemia H/H 6.7/21.4 (this was 7.3/25.9 on 06/18/2022) Given total 2 units PRBCs so far this visit with Hgb stable at 7.4 this AM Recurrent bleed from graft site, since stopped Cont to follow CBC trends and transfuse as needed  Sepsis secondary to possible left sided pyelonephritis and MRSA bacteremia Patient was febrile, tachycardic and tachypneic (met SIRS criteria), CT abdomen and pelvis was suggestive of left renal infection. Blood cx pos for MRSA. Now on vanc ID consulted, thus far recs to continue rocephin and vanc Ulceration noted at fistula site F/u on repeat blood cx 2d echo with some calcifications, no florid vegetations noted.  Have discussed with Cardiology regarding  TEE   ESRD on HD (MWF) Nephrology following Ulceration at fistula site Vascular Surgery following. 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 Would resume when hgb stable Previously seen and evaluated by Vascular Surgery CT findings mention of occlusion of prox SMA with distal reconstitution of flow, noted to be similar to prior exam.   Prolonged QT interval QTc 502 ms Avoid QT prolonging drugs Magnesium level was 2.1 Cont to monitor on tele   Pancreatic cyst CT abdomen and pelvis showed cystic lesion of the tail of the pancreas measuring 17 mm Nonemergent contrast-enhanced MRCP was recommended Elevated BNP (chronic) Consider ordering when more stable   Essential hypertension Continue Coreg  GERD Continue Protonix   Tobacco use disorder Patient counseled on tobacco abuse cessation  Coronary calcification -Recent CTA chest reviewed with findings of 4 vessel coronary calcification -Ordered and reviewed 2d echo, normal LVEF with no WMA  Abd pain -CT abd reviewed. 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 Ordered and reviewed abd xray, unremarkable      Subjective: Without complaints this afternoon. Noted to have recurrent bleed from fistula site  Physical Exam: Vitals:   07/09/22 2210 07/09/22 2318 07/10/22 0409 07/10/22 0801  BP: (!) 184/102 Marland Kitchen)  161/118 (!) 149/90   Pulse:  98 100 (!) 103  Resp:  18 17 15   Temp:  98.2 F (36.8 C) 98.6 F (37 C) 98.1 F (36.7 C)  TempSrc:  Oral Oral Oral  SpO2:  100% 93% 98%   Weight:  99.5 kg    Height:  5' 4"  (1.626 m)     General exam: Awake, laying in bed, in nad Respiratory system: Normal respiratory effort, no wheezing Cardiovascular system: regular rate, s1, s2 Gastrointestinal system: Soft, nondistended, positive BS Central nervous system: CN2-12 grossly intact, strength intact Extremities: Perfused, no clubbing Skin: Normal skin turgor, no notable skin lesions seen Psychiatry: Mood normal // no visual hallucinations   Data Reviewed:  Labs reviewed: Na 136, K 3.7, WBC 10.5, Hgb 7.4   Family Communication: Pt in room, family not at bedside  Disposition: Status is: Inpatient Remains inpatient appropriate because: Severity of illness  Planned Discharge Destination: Home    Author: Marylu Lund, MD 07/10/2022 4:19 PM  For on call review [email or web address].

## 2022-07-10 NOTE — Consult Note (Signed)
Rebekah Burton for Infectious Disease    Date of Admission:  07/07/2022   Total days of inpatient antibiotics 2        Reason for Consult:     Principal Problem:   Symptomatic anemia Active Problems:   ESRD (end stage renal disease) (HCC)   Essential hypertension   Acute blood loss anemia   Acquired abnormality of superior vena cava   Hypokalemia   Tobacco use disorder   Sepsis ([place])   Acute pyelonephritis   Hypoalbuminemia due to protein-calorie malnutrition (HCC)   Assessment:  60 YF with ESRD on IHD, b/l TMA, admitted for sepsis 2/2 suspected left sided pyelonephritis found to have MRSA bacteremia. Presented with abdominal pain. She was placed on antibiotics for UTI by PCP(had not started the meds).   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 Focal heterogenous region in left kidney 2/2 infeciton vs renal mass. Severe aortic atherosclerosis with occlusion of proximal  SMA and distal reconstitution of flow similar to prior exam. Indeterminate focus left pelvis measuring 2.2cm, findings could be evaluated with pelvic US. 59mm cystic lesion on pancreas tail, recommend non emergent MRCP.  -Pelvis US showed torsion unlikely, no ovarian adnexal mass.  -Her abdominal pain started 1 week ago on Friday.She noted bleeding from fistula after HD on Monday followed by pain pain. Pt had fistual placed in [place] about 14 years aog.  -Seen by vascular, plan on OR tomorrow of repair of fistula -I am concerned that region in left kidney may be embolic disease either from the fistula or endocarditis.  TTE showed calcification on chordae tenddinae of MV, MV degenerative.   Recommendations:  -D/C ceftriaxone -Continue vancomycin -Repeat blood Cx to ensure clearance -Follow blood  Cx -TEE -Plan on surgical repair of ulcerated fistula  #Pancreatic cyst -MRCP when more stable  #Early partial SBO #Abdominal pina -Management per primary, -Reports abdominal pain is improving Microbiology:   Antibiotics: Vancomycin and ceftriaxone  Cultures: Blood 7/14 2/2 MRSA    HPI: Rebekah Burton is a 46 y.o. female with ESRD on HD, hypertension, PAD, blindness, bilateral TMA, tobacco abuse admitted for symptomatic anemia, sepsis secondary to left-sided pyelonephritis.  Patient presented with abdominal pain that started about a week ago.  She was seen by PCP and started on antibiotics for UTI.  Patient is an uric.  She did not start the medication as she did not get it mailed to her.  On arrival to the ED to temp of 101.9 hemoglobin 6.7.  CT showed focal heterogenous area of interpolar region of left kidney possibly secondary to infection although renal mass cannot be excluded, UA and CT follow-up 6 month, partial/early SBO. ID engaged per MRSA bacteremia.    Review of Systems: Review of Systems  All other systems reviewed and are negative.   Past Medical History:  Diagnosis Date   Anemia    Blind    Blind    ESRD (end stage renal disease) ([place])    Foot ulceration ([place]) 12/2018   History of transmetatarsal amputation of left foot (HCC)    History of transmetatarsal amputation of right foot (HCC)    Hypertension    PAD (peripheral artery disease) (HCC)    Tobacco abuse     Social History   Tobacco Use  Smoking status: Former    Packs/day: 0.50    Years: 20.00    Total pack years: 10.00    Types: Cigarettes    Quit date: 01/14/2019    Years since quitting: 3.4   Smokeless tobacco: Never   Tobacco comments:    <1 PPD  Vaping Use   Vaping Use: Never used  Substance Use Topics   Alcohol use: Never   Drug use: Not Currently    Types: Marijuana    Family History  Problem Relation Age of Onset   Peripheral Artery Disease Neg Hx    Kidney failure Neg Hx     Scheduled Meds:  calcitRIOL  2.5 mcg Oral Q M,W,F-HD   carvedilol  25 mg Oral BID   Chlorhexidine Gluconate Cloth  6 each Topical Q0600   Chlorhexidine Gluconate Cloth  6 each Topical Q0600   cinacalcet  90 mg Oral Q M,W,F-HD   feeding supplement  237 mL Oral BID BM   lanthanum  1,000 mg Oral TID WC   multivitamin  1 tablet Oral QHS   pantoprazole  40 mg Oral Daily   polyethylene glycol  17 g Oral Daily   Continuous Infusions:  sodium chloride     cefTRIAXone (ROCEPHIN)  IV 1 g (07/10/22 0506)   [START ON 07/11/2022] vancomycin     PRN Meds:.acetaminophen **OR** acetaminophen, bisacodyl, fentaNYL (SUBLIMAZE) injection, hydrALAZINE, lidocaine (PF), lidocaine-prilocaine, pentafluoroprop-tetrafluoroeth No Known Allergies  OBJECTIVE: Blood pressure (!) 149/90, pulse (!) 103, temperature 98.1 F (36.7 C), temperature source Oral, resp. rate 15, height 5\' 4"  (1.626 m), weight 99.5 kg, SpO2 98 %.  Physical Exam Constitutional:      Appearance: Normal appearance.  HENT:     Head: Normocephalic and atraumatic.     Right Ear: Tympanic membrane normal.     Left Ear: Tympanic membrane normal.     Nose: Nose normal.     Mouth/Throat:     Mouth: Mucous membranes are moist.  Eyes:     Extraocular Movements: Extraocular movements intact.     Conjunctiva/sclera: Conjunctivae normal.     Pupils: Pupils are equal, round, and reactive to light.  Cardiovascular:     Rate and Rhythm: Normal rate and regular rhythm.     Heart sounds: No murmur heard.    No friction rub. No gallop.  Pulmonary:     Effort: Pulmonary effort is normal.     Breath sounds: Normal breath sounds.  Abdominal:     General: Abdomen is flat.     Palpations: Abdomen is soft.  Musculoskeletal:        General: Normal range of motion.  Skin:    General: Skin is warm and dry.  Neurological:     General: No focal deficit present.     Mental Status: She is alert and oriented to person, place, and time.  Psychiatric:         Mood and Affect: Mood normal.     Lab Results Lab Results  Component Value Date   WBC 10.5 07/10/2022   HGB 7.4 (L) 07/10/2022   HCT 23.0 (L) 07/10/2022   MCV 87.5 07/10/2022   PLT 96 (L) 07/10/2022    Lab Results  Component Value Date   CREATININE 6.86 (H) 07/10/2022   BUN 37 (H) 07/10/2022   NA 136 07/10/2022   K 3.7 07/10/2022   CL 95 (L) 07/10/2022   CO2 26 07/10/2022    Lab Results  Component Value Date   ALT 18  07/09/2022   AST 17 07/09/2022   ALKPHOS 62 07/09/2022   BILITOT 2.7 (H) 07/09/2022       Laurice Record, MD [facility] 07/10/2022, 3:48 PM

## 2022-07-10 NOTE — Progress Notes (Signed)
Pt had 2 episodes of Bleeding from AVF today Pt Hemodynamically stable both times Pressure Dressing Applied both times

## 2022-07-10 NOTE — Progress Notes (Signed)
Pharmacist Heart Failure Core Measure Documentation  Assessment: Rebekah Burton has an EF documented as 65-70% on 07/08/22.  Rationale: Heart failure patients with left ventricular systolic dysfunction (LVSD) and an EF < 40% should be prescribed an angiotensin converting enzyme inhibitor (ACEI) or angiotensin receptor blocker (ARB) at discharge unless a contraindication is documented in the medical record.  This patient is not currently on an ACEI or ARB for HF.  This note is being placed in the record in order to provide documentation that a contraindication to the use of these agents is present for this encounter.  ACE Inhibitor or Angiotensin Receptor Blocker is contraindicated (specify all that apply)  []   ACEI allergy AND ARB allergy []   Angioedema []   Moderate or severe aortic stenosis []   Hyperkalemia []   Hypotension []   Renal artery stenosis [x]   Worsening renal function, preexisting renal disease or dysfunction (Pt on hemodialysis)   Billey Gosling, PharmD PGY1 Pharmacy Resident  7/16/202312:51 PM

## 2022-07-10 NOTE — Progress Notes (Addendum)
Pharmacy Antibiotic Note  Rebekah Burton is a 46 y.o. female admitted on 07/07/2022 with MRSA bacteremia. Received 1500mg  x1 pre-HD 7/15 (MWF schedule). Pharmacy has been consulted for Vancomycin dosing.  Plan: Vancomycin 750mg  x1 as post-HD dose F/u pre- and post-HD doses for 7/17 HD  Check vanco level pre-HD 7/17   Height: 5\' 4"  (162.6 cm) Weight: 99.5 kg (219 lb 5.7 oz) IBW/kg (Calculated) : 54.7  Temp (24hrs), Avg:98.2 F (36.8 C), Min:97.9 F (36.6 C), Max:98.6 F (37 C)  Recent Labs  Lab 07/07/22 1108 07/07/22 1215 07/07/22 2320 07/08/22 0936 07/09/22 0521 07/10/22 1025  WBC  --  6.7 8.4 9.6 10.2 10.5  CREATININE 10.50*  --  7.34* 8.20* 9.15* 6.86*     Estimated Creatinine Clearance: 12.1 mL/min (A) (by C-G formula based on SCr of 6.86 mg/dL (H)).    No Known Allergies  Antimicrobials this admission: Vancomycin 7/15 >>   Dose adjustments this admission:   Microbiology results:  BCx: MRSA    Thank you for allowing pharmacy to be a part of this patient's care.  Billey Gosling 07/10/2022 12:32 PM

## 2022-07-10 NOTE — Progress Notes (Signed)
07/09/2022 2300 Received pt to room 4E-13 from [facility] via [place].  Pt is A&Ox4, C/O abdominal pain and "pain all over."  Tele monitor applied and CCMD notified.  CHG bath given.  Oriented to room, call light and bed.  Call bell in reach. [place]

## 2022-07-10 NOTE — Consult Note (Addendum)
Vascular and Vein Specialist of [facility]  Patient name: Rebekah Burton MRN: [id number] DOB: Aug 30, 1976 Sex: female   REQUESTING PROVIDER:   Renal   REASON FOR CONSULT:    Ulcerated fistula  HISTORY OF PRESENT ILLNESS:   Rebekah Burton is a 46 y.o. female, who is having pain in her left arm fistula and was found to have a ulcerated area while at dialysis yesterday at [facility].  She was transferred to [facility] for further evaluation.  The patient has a left brachiocephalic fistula that was not placed here at [facility].  She has undergone interventions in the past and has covered stents in the midportion of her fistula.  She was evaluated recently by our service for SVC occlusion causing right arm swelling.  Her fistula has been functioning well.  The patient has a history of bilateral lower extremity bypasses as well as TMA's in 2010.  She is blind.  She is medically managed for hypertension.  PAST MEDICAL HISTORY    Past Medical History:  Diagnosis Date   Anemia    Blind    Blind    ESRD (end stage renal disease) ([place])    Foot ulceration ([place]) 12/2018   History of transmetatarsal amputation of left foot (H[facility])    History of transmetatarsal amputation of right foot (HCC)    Hypertension    PAD (peripheral artery disease) (H[facility])    Tobacco abuse      FAMILY HISTORY   Family History  Problem Relation Age of Onset   Peripheral Artery Disease Neg Hx    Kidney failure Neg Hx     SOCIAL HISTORY:   Social History   Socioeconomic History   Marital status: Single    Spouse name: Not on file   Number of children: Not on file   Years of education: Not on file   Highest education level: Not on file  Occupational History   Not on file  Tobacco Use   Smoking status: Former    Packs/day: 0.50    Years: 20.00    Total pack years: 10.00    Types: Cigarettes    Quit date: 01/14/2019    Years since quitting: 3.4   Smokeless tobacco: Never   Tobacco  comments:    <1 PPD  Vaping Use   Vaping Use: Never used  Substance and Sexual Activity   Alcohol use: Never   Drug use: Not Currently    Types: Marijuana   Sexual activity: Never  Other Topics Concern   Not on file  Social History Narrative   Not on file   Social Determinants of Health   Financial Resource Strain: Not on file  Food Insecurity: Not on file  Transportation Needs: Not on file  Physical Activity: Not on file  Stress: Not on file  Social Connections: Not on file  Intimate Partner Violence: Not on file    ALLERGIES:    No Known Allergies  CURRENT MEDICATIONS:    Current Facility-Administered Medications  Medication Dose Route Frequency Provider Last Rate Last Admin   0.9 %  sodium chloride infusion  10 mL/hr Intravenous Once Donne Hazel, MD       acetaminophen (TYLENOL) tablet 650 mg  650 mg Oral Q6H PRN Donne Hazel, MD   650 mg at 07/09/22 1238   Or   acetaminophen (TYLENOL) suppository 650 mg  650 mg Rectal Q6H PRN Donne Hazel, MD       bisacodyl (DULCOLAX) suppository 10 mg  10 mg Rectal Daily PRN Donne Hazel, MD       calcitRIOL (ROCALTROL) capsule 2.5 mcg  2.5 mcg Oral Q M,W,F-HD Donne Hazel, MD       carvedilol (COREG) tablet 25 mg  25 mg Oral BID Adefeso, Oladapo, DO   25 mg at 07/10/22 0920   cefTRIAXone (ROCEPHIN) 1 g in sodium chloride 0.9 % 100 mL IVPB  1 g Intravenous Q24H Donne Hazel, MD 200 mL/hr at 07/10/22 0506 1 g at 07/10/22 0506   Chlorhexidine Gluconate Cloth 2 % PADS 6 each  6 each Topical Q0600 Donne Hazel, MD   6 each at 07/09/22 3545   Chlorhexidine Gluconate Cloth 2 % PADS 6 each  6 each Topical Q0600 Donne Hazel, MD   6 each at 07/10/22 0506   cinacalcet (SENSIPAR) tablet 90 mg  90 mg Oral Q M,W,F-HD Donne Hazel, MD       feeding supplement (ENSURE ENLIVE / ENSURE PLUS) liquid 237 mL  237 mL Oral BID BM Donne Hazel, MD   237 mL at 07/08/22 1400   fentaNYL (SUBLIMAZE) injection 25 mcg  25 mcg  Intravenous Q2H PRN Donne Hazel, MD   25 mcg at 07/10/22 0920   hydrALAZINE (APRESOLINE) injection 10 mg  10 mg Intravenous Q6H PRN Adefeso, Oladapo, DO   10 mg at 07/09/22 2221   lanthanum (FOSRENOL) chewable tablet 1,000 mg  1,000 mg Oral TID WC Adefeso, Oladapo, DO   1,000 mg at 07/10/22 0920   lidocaine (PF) (XYLOCAINE) 1 % injection 5 mL  5 mL Intradermal PRN Donne Hazel, MD       lidocaine-prilocaine (EMLA) cream 1 Application  1 Application Topical PRN Donne Hazel, MD       multivitamin (RENA-VIT) tablet 1 tablet  1 tablet Oral QHS Adefeso, Oladapo, DO   1 tablet at 07/09/22 2105   pantoprazole (PROTONIX) EC tablet 40 mg  40 mg Oral Daily Donne Hazel, MD   40 mg at 07/10/22 0920   pentafluoroprop-tetrafluoroeth (GEBAUERS) aerosol 1 Application  1 Application Topical PRN Donne Hazel, MD       polyethylene glycol (MIRALAX / Floria Raveling) packet 17 g  17 g Oral Daily Donne Hazel, MD   17 g at 07/09/22 0948    REVIEW OF SYSTEMS:   [X]  denotes positive finding, [ ]  denotes negative finding Cardiac  Comments:  Chest pain or chest pressure:    Shortness of breath upon exertion:    Short of breath when lying flat:    Irregular heart rhythm:        Vascular    Pain in calf, thigh, or hip brought on by ambulation:    Pain in feet at night that wakes you up from your sleep:     Blood clot in your veins:    Leg swelling:         Pulmonary    Oxygen at home:    Productive cough:     Wheezing:         Neurologic    Sudden weakness in arms or legs:     Sudden numbness in arms or legs:     Sudden onset of difficulty speaking or slurred speech:    Temporary loss of vision in one eye:     Problems with dizziness:         Gastrointestinal    Blood in stool:      Vomited  blood:         Genitourinary    Burning when urinating:     Blood in urine:        Psychiatric    Major depression:         Hematologic    Bleeding problems:    Problems with blood clotting  too easily:        Skin    Rashes or ulcers:        Constitutional    Fever or chills:     PHYSICAL EXAM:   Vitals:   07/09/22 2210 07/09/22 2318 07/10/22 0409 07/10/22 0801  BP: (!) 184/102 (!) 161/118 (!) 149/90   Pulse:  98 100 (!) 103  Resp:  18 17 15   Temp:  98.2 F (36.8 C) 98.6 F (37 C) 98.1 F (36.7 C)  TempSrc:  Oral Oral Oral  SpO2:  100% 93% 98%  Weight:  99.5 kg    Height:  5\' 4"  (1.626 m)      GENERAL: The patient is a well-nourished female, in no acute distress. The vital signs are documented above. CARDIAC: There is a regular rate and rhythm.  VASCULAR: Tenderness over fistula which has a good thrill to it however there is a ulcerated segment PULMONARY: Nonlabored respirations ABDOMEN: Soft and non-tender with normal pitched bowel sounds.  MUSCULOSKELETAL: Bilateral TMA NEUROLOGIC: No focal weakness or paresthesias are detected. SKIN: There are no ulcers or rashes noted. PSYCHIATRIC: The patient has a normal affect.  STUDIES:   None  ASSESSMENT and PLAN   Ulcerated fistula: The patient was transferred to [facility] for surgical evaluation.  Unfortunately, she has eaten today, and so we will plan to do this tomorrow.  She has been on Eliquis but from the chart records she has not had it in several days.  We will plan for surgical repair of her ulcerated fistula.  There is a stent in this area and so this could make salvage of her fistula somewhat challenging.  She may need a new access or revision which would also require placement of a catheter.  This would be a temporary catheter as she has MRSA bacteremia.  She will be n.p.o. after midnight.  All questions answered.   Leia Alf, MD, FACS [facility] Vein Specialists of [facility] [phone] Pager [phone]

## 2022-07-11 ENCOUNTER — Encounter (HOSPITAL_COMMUNITY)
Admission: EM | Disposition: A | Discharge status: Home / Self Care | Payer: Self-pay | Source: Home / Self Care | Attending: Family Medicine

## 2022-07-11 ENCOUNTER — Inpatient Hospital Stay (HOSPITAL_COMMUNITY): Payer: Medicare HMO

## 2022-07-11 ENCOUNTER — Inpatient Hospital Stay (HOSPITAL_COMMUNITY): Payer: Medicare HMO | Admitting: Certified Registered Nurse Anesthetist

## 2022-07-11 ENCOUNTER — Other Ambulatory Visit: Payer: Self-pay

## 2022-07-11 DIAGNOSIS — I469 Cardiac arrest, cause unspecified: Secondary | ICD-10-CM

## 2022-07-11 DIAGNOSIS — Z992 Dependence on renal dialysis: Secondary | ICD-10-CM | POA: Diagnosis not present

## 2022-07-11 DIAGNOSIS — T827XXA Infection and inflammatory reaction due to other cardiac and vascular devices, implants and grafts, initial encounter: Secondary | ICD-10-CM

## 2022-07-11 DIAGNOSIS — I120 Hypertensive chronic kidney disease with stage 5 chronic kidney disease or end stage renal disease: Secondary | ICD-10-CM

## 2022-07-11 DIAGNOSIS — Z87891 Personal history of nicotine dependence: Secondary | ICD-10-CM

## 2022-07-11 DIAGNOSIS — N10 Acute tubulo-interstitial nephritis: Secondary | ICD-10-CM | POA: Diagnosis not present

## 2022-07-11 DIAGNOSIS — D649 Anemia, unspecified: Secondary | ICD-10-CM | POA: Diagnosis not present

## 2022-07-11 DIAGNOSIS — D62 Acute posthemorrhagic anemia: Secondary | ICD-10-CM | POA: Diagnosis not present

## 2022-07-11 DIAGNOSIS — N186 End stage renal disease: Secondary | ICD-10-CM

## 2022-07-11 DIAGNOSIS — T82898A Other specified complication of vascular prosthetic devices, implants and grafts, initial encounter: Secondary | ICD-10-CM | POA: Diagnosis not present

## 2022-07-11 DIAGNOSIS — N185 Chronic kidney disease, stage 5: Secondary | ICD-10-CM | POA: Diagnosis not present

## 2022-07-11 HISTORY — PX: REVISON OF ARTERIOVENOUS FISTULA: SHX6074

## 2022-07-11 HISTORY — PX: INSERTION OF DIALYSIS CATHETER: SHX1324

## 2022-07-11 LAB — RENAL FUNCTION PANEL
Albumin: 2.8 g/dL — ABNORMAL LOW (ref 3.5–5.0)
Anion gap: 17 — ABNORMAL HIGH (ref 5–15)
BUN: 45 mg/dL — ABNORMAL HIGH (ref 6–20)
CO2: 25 mmol/L (ref 22–32)
Calcium: 9.7 mg/dL (ref 8.9–10.3)
Chloride: 94 mmol/L — ABNORMAL LOW (ref 98–111)
Creatinine, Ser: 7.96 mg/dL — ABNORMAL HIGH (ref 0.44–1.00)
GFR, Estimated: 6 mL/min — ABNORMAL LOW (ref >60)
Glucose, Bld: 123 mg/dL — ABNORMAL HIGH (ref 70–99)
Phosphorus: 4 mg/dL (ref 2.5–4.6)
Potassium: 3.6 mmol/L (ref 3.5–5.1)
Sodium: 136 mmol/L (ref 135–145)

## 2022-07-11 LAB — CULTURE, BLOOD (ROUTINE X 2): Special Requests: ADEQUATE

## 2022-07-11 LAB — BASIC METABOLIC PANEL
Anion gap: 20 — ABNORMAL HIGH (ref 5–15)
BUN: 51 mg/dL — ABNORMAL HIGH (ref 6–20)
CO2: 24 mmol/L (ref 22–32)
Calcium: 9.8 mg/dL (ref 8.9–10.3)
Chloride: 91 mmol/L — ABNORMAL LOW (ref 98–111)
Creatinine, Ser: 8.39 mg/dL — ABNORMAL HIGH (ref 0.44–1.00)
GFR, Estimated: 6 mL/min — ABNORMAL LOW (ref >60)
Glucose, Bld: 188 mg/dL — ABNORMAL HIGH (ref 70–99)
Potassium: 3.8 mmol/L (ref 3.5–5.1)
Sodium: 135 mmol/L (ref 135–145)

## 2022-07-11 LAB — GLUCOSE, CAPILLARY
Glucose-Capillary: 144 mg/dL — ABNORMAL HIGH (ref 70–99)
Glucose-Capillary: 160 mg/dL — ABNORMAL HIGH (ref 70–99)
Glucose-Capillary: 122 mg/dL — ABNORMAL HIGH (ref 70–99)

## 2022-07-11 LAB — SURGICAL PCR SCREEN
MRSA, PCR: POSITIVE — AB
Staphylococcus aureus: POSITIVE — AB

## 2022-07-11 LAB — POCT I-STAT 7, (LYTES, BLD GAS, ICA,H+H)
Acid-Base Excess: 2 mmol/L (ref 0.0–2.0)
Bicarbonate: 28.5 mmol/L — ABNORMAL HIGH (ref 20.0–28.0)
Calcium, Ion: 1.26 mmol/L (ref 1.15–1.40)
HCT: 20 % — ABNORMAL LOW (ref 36.0–46.0)
Hemoglobin: 6.8 g/dL — CL (ref 12.0–15.0)
O2 Saturation: 100 %
Potassium: 3.5 mmol/L (ref 3.5–5.1)
Sodium: 132 mmol/L — ABNORMAL LOW (ref 135–145)
TCO2: 30 mmol/L (ref 22–32)
pCO2 arterial: 53.5 mmHg — ABNORMAL HIGH (ref 32–48)
pH, Arterial: 7.334 — ABNORMAL LOW (ref 7.35–7.45)
pO2, Arterial: 293 mmHg — ABNORMAL HIGH (ref 83–108)

## 2022-07-11 LAB — CBC
HCT: 20.9 % — ABNORMAL LOW (ref 36.0–46.0)
Hemoglobin: 7 g/dL — ABNORMAL LOW (ref 12.0–15.0)
MCH: 29 pg (ref 26.0–34.0)
MCHC: 33.5 g/dL (ref 30.0–36.0)
MCV: 86.7 fL (ref 80.0–100.0)
Platelets: 126 10*3/uL — ABNORMAL LOW (ref 150–400)
RBC: 2.41 MIL/uL — ABNORMAL LOW (ref 3.87–5.11)
RDW: 18.4 % — ABNORMAL HIGH (ref 11.5–15.5)
WBC: 10.5 10*3/uL (ref 4.0–10.5)
nRBC: 0 % (ref 0.0–0.2)

## 2022-07-11 LAB — MAGNESIUM: Magnesium: 1.8 mg/dL (ref 1.7–2.4)

## 2022-07-11 LAB — TROPONIN I (HIGH SENSITIVITY)
Troponin I (High Sensitivity): 72 ng/L — ABNORMAL HIGH (ref <18)
Troponin I (High Sensitivity): 80 ng/L — ABNORMAL HIGH (ref <18)

## 2022-07-11 LAB — VANCOMYCIN, RANDOM: Vancomycin Rm: 40 ug/mL

## 2022-07-11 SURGERY — REVISON OF ARTERIOVENOUS FISTULA
Anesthesia: General

## 2022-07-11 MED ORDER — CARVEDILOL 12.5 MG PO TABS
ORAL_TABLET | ORAL | Status: AC
  Administered 2022-07-11: 25 mg via ORAL
  Filled 2022-07-11: qty 2

## 2022-07-11 MED ORDER — SODIUM CHLORIDE 0.9 % IV SOLN: INTRAVENOUS | Status: DC | PRN

## 2022-07-11 MED ORDER — VANCOMYCIN HCL IN DEXTROSE 1-5 GM/200ML-% IV SOLN
1000.0000 mg | Freq: Once | INTRAVENOUS | Status: AC
  Administered 2022-07-11: 1000 mg via INTRAVENOUS

## 2022-07-11 MED ORDER — PHENYLEPHRINE HCL-NACL 20-0.9 MG/250ML-% IV SOLN
INTRAVENOUS | Status: DC | PRN
  Administered 2022-07-11: 30 ug/min via INTRAVENOUS

## 2022-07-11 MED ORDER — VANCOMYCIN HCL IN DEXTROSE 1-5 GM/200ML-% IV SOLN
INTRAVENOUS | Status: AC
  Filled 2022-07-11: qty 200

## 2022-07-11 MED ORDER — HEPARIN SODIUM (PORCINE) 1000 UNIT/ML IJ SOLN
INTRAMUSCULAR | Status: AC
  Filled 2022-07-11: qty 10

## 2022-07-11 MED ORDER — PHENYLEPHRINE 80 MCG/ML (10ML) SYRINGE FOR IV PUSH (FOR BLOOD PRESSURE SUPPORT)
PREFILLED_SYRINGE | INTRAVENOUS | Status: AC
  Filled 2022-07-11: qty 10

## 2022-07-11 MED ORDER — EPINEPHRINE 1 MG/10ML IJ SOSY
PREFILLED_SYRINGE | INTRAMUSCULAR | Status: AC
  Filled 2022-07-11: qty 20

## 2022-07-11 MED ORDER — CHLORHEXIDINE GLUCONATE CLOTH 2 % EX PADS
6.0000 | MEDICATED_PAD | Freq: Every day | CUTANEOUS | Status: AC
  Administered 2022-07-11 – 2022-07-16 (×5): 6 via TOPICAL

## 2022-07-11 MED ORDER — HEPARIN SODIUM (PORCINE) 1000 UNIT/ML IJ SOLN
INTRAMUSCULAR | Status: DC | PRN
  Administered 2022-07-11: 2800 [IU]

## 2022-07-11 MED ORDER — LIDOCAINE 2% (20 MG/ML) 5 ML SYRINGE
INTRAMUSCULAR | Status: AC
  Filled 2022-07-11: qty 5

## 2022-07-11 MED ORDER — LIDOCAINE-EPINEPHRINE (PF) 1 %-1:200000 IJ SOLN
INTRAMUSCULAR | Status: AC
  Filled 2022-07-11: qty 30

## 2022-07-11 MED ORDER — ATROPINE SULFATE 1 MG/ML IV SOLN
INTRAVENOUS | Status: DC | PRN
  Administered 2022-07-11: .4 mg via INTRAVENOUS

## 2022-07-11 MED ORDER — MUPIROCIN 2 % EX OINT
1.0000 | TOPICAL_OINTMENT | Freq: Two times a day (BID) | CUTANEOUS | Status: AC
  Administered 2022-07-11 – 2022-07-15 (×9): 1 via NASAL
  Filled 2022-07-11 (×3): qty 22

## 2022-07-11 MED ORDER — LIDOCAINE HCL (PF) 1 % IJ SOLN
INTRAMUSCULAR | Status: AC
  Filled 2022-07-11: qty 30

## 2022-07-11 MED ORDER — NOREPINEPHRINE 4 MG/250ML-% IV SOLN
INTRAVENOUS | Status: AC
  Filled 2022-07-11: qty 250

## 2022-07-11 MED ORDER — 0.9 % SODIUM CHLORIDE (POUR BTL) OPTIME
TOPICAL | Status: DC | PRN
  Administered 2022-07-11: 1000 mL

## 2022-07-11 MED ORDER — MIDAZOLAM HCL 2 MG/2ML IJ SOLN
INTRAMUSCULAR | Status: AC
  Filled 2022-07-11: qty 2

## 2022-07-11 MED ORDER — FENTANYL CITRATE (PF) 250 MCG/5ML IJ SOLN
INTRAMUSCULAR | Status: AC
  Filled 2022-07-11: qty 5

## 2022-07-11 MED ORDER — HEPARIN 6000 UNIT IRRIGATION SOLUTION
Status: AC
  Filled 2022-07-11: qty 500

## 2022-07-11 MED ORDER — EPINEPHRINE 1 MG/10ML IJ SOSY
PREFILLED_SYRINGE | INTRAMUSCULAR | Status: DC | PRN
  Administered 2022-07-11: 200 ug via INTRAVENOUS
  Administered 2022-07-11: 800 ug via INTRAVENOUS

## 2022-07-11 MED ORDER — FENTANYL CITRATE (PF) 250 MCG/5ML IJ SOLN
INTRAMUSCULAR | Status: DC | PRN
  Administered 2022-07-11: 25 ug via INTRAVENOUS

## 2022-07-11 MED ORDER — DEXAMETHASONE SODIUM PHOSPHATE 10 MG/ML IJ SOLN
INTRAMUSCULAR | Status: AC
  Filled 2022-07-11: qty 1

## 2022-07-11 MED ORDER — DEXAMETHASONE SODIUM PHOSPHATE 10 MG/ML IJ SOLN
INTRAMUSCULAR | Status: DC | PRN
  Administered 2022-07-11: 10 mg via INTRAVENOUS

## 2022-07-11 MED ORDER — HEPARIN 6000 UNIT IRRIGATION SOLUTION
Status: DC | PRN
  Administered 2022-07-11: 1

## 2022-07-11 MED ORDER — VANCOMYCIN HCL 1000 MG IV SOLR: 1000.0000 mg | Freq: Once | INTRAVENOUS | Status: DC

## 2022-07-11 MED ORDER — LIDOCAINE 2% (20 MG/ML) 5 ML SYRINGE
INTRAMUSCULAR | Status: DC | PRN
  Administered 2022-07-11: 60 mg via INTRAVENOUS

## 2022-07-11 MED ORDER — PROPOFOL 10 MG/ML IV BOLUS
INTRAVENOUS | Status: DC | PRN
  Administered 2022-07-11: 120 mg via INTRAVENOUS

## 2022-07-11 MED ORDER — ATROPINE SULFATE 0.4 MG/ML IV SOLN
INTRAVENOUS | Status: AC
  Filled 2022-07-11: qty 1

## 2022-07-11 MED ORDER — VANCOMYCIN VARIABLE DOSE PER UNSTABLE RENAL FUNCTION (PHARMACIST DOSING): Status: DC

## 2022-07-11 MED ORDER — CARVEDILOL 12.5 MG PO TABS: 25.0000 mg | ORAL_TABLET | Freq: Once | ORAL | Status: AC

## 2022-07-11 MED ORDER — CALCIUM CHLORIDE 10 % IV SOLN
INTRAVENOUS | Status: DC | PRN
  Administered 2022-07-11: 1 g via INTRAVENOUS

## 2022-07-11 MED ORDER — ONDANSETRON HCL 4 MG/2ML IJ SOLN
INTRAMUSCULAR | Status: DC | PRN
  Administered 2022-07-11: 4 mg via INTRAVENOUS

## 2022-07-11 MED ORDER — VASOPRESSIN 20 UNIT/ML IV SOLN
INTRAVENOUS | Status: AC
  Filled 2022-07-11: qty 1

## 2022-07-11 MED ORDER — PHENYLEPHRINE 80 MCG/ML (10ML) SYRINGE FOR IV PUSH (FOR BLOOD PRESSURE SUPPORT)
PREFILLED_SYRINGE | INTRAVENOUS | Status: DC | PRN
  Administered 2022-07-11 (×3): 80 ug via INTRAVENOUS
  Administered 2022-07-11 (×2): 160 ug via INTRAVENOUS
  Administered 2022-07-11: 80 ug via INTRAVENOUS

## 2022-07-11 SURGICAL SUPPLY — 56 items
ARMBAND PINK RESTRICT EXTREMIT (MISCELLANEOUS) ×3 IMPLANT
BAG COUNTER SPONGE SURGICOUNT (BAG) ×3 IMPLANT
BAG DECANTER FOR FLEXI CONT (MISCELLANEOUS) ×1 IMPLANT
BENZOIN TINCTURE PRP APPL 2/3 (GAUZE/BANDAGES/DRESSINGS) ×3 IMPLANT
BIOPATCH RED 1 DISK 7.0 (GAUZE/BANDAGES/DRESSINGS) ×1 IMPLANT
BNDG GAUZE DERMACEA FLUFF (GAUZE/BANDAGES/DRESSINGS) ×1
BNDG GAUZE DERMACEA FLUFF 4 (GAUZE/BANDAGES/DRESSINGS) IMPLANT
CANISTER SUCT 3000ML PPV (MISCELLANEOUS) ×3 IMPLANT
CANNULA VESSEL 3MM 2 BLNT TIP (CANNULA) ×3 IMPLANT
CHLORAPREP W/TINT 26 (MISCELLANEOUS) ×3 IMPLANT
CNTNR URN SCR LID CUP LEK RST (MISCELLANEOUS) IMPLANT
CONT SPEC 4OZ STRL OR WHT (MISCELLANEOUS) ×1
COVER PROBE W GEL 5X96 (DRAPES) ×2 IMPLANT
DRSG IV TEGADERM 3.5X4.5 STRL (GAUZE/BANDAGES/DRESSINGS) ×1 IMPLANT
DRSG PAD ABDOMINAL 8X10 ST (GAUZE/BANDAGES/DRESSINGS) ×1 IMPLANT
ELECT REM PT RETURN 9FT ADLT (ELECTROSURGICAL) ×3
ELECTRODE REM PT RTRN 9FT ADLT (ELECTROSURGICAL) ×2 IMPLANT
GAUZE SPONGE 4X4 12PLY STRL (GAUZE/BANDAGES/DRESSINGS) ×2 IMPLANT
GLOVE BIO SURGEON STRL SZ 6.5 (GLOVE) ×1 IMPLANT
GLOVE BIOGEL PI IND STRL 6.5 (GLOVE) IMPLANT
GLOVE BIOGEL PI INDICATOR 6.5 (GLOVE) ×1
GLOVE SURG SS PI 8.0 STRL IVOR (GLOVE) ×3 IMPLANT
GOWN STRL REUS W/ TWL LRG LVL3 (GOWN DISPOSABLE) ×4 IMPLANT
GOWN STRL REUS W/ TWL XL LVL3 (GOWN DISPOSABLE) ×2 IMPLANT
GOWN STRL REUS W/TWL LRG LVL3 (GOWN DISPOSABLE) ×2
GOWN STRL REUS W/TWL XL LVL3 (GOWN DISPOSABLE) ×1
INSERT FOGARTY SM (MISCELLANEOUS) IMPLANT
KIT BASIN OR (CUSTOM PROCEDURE TRAY) ×3 IMPLANT
KIT TURNOVER KIT B (KITS) ×3 IMPLANT
NDL 18GX1X1/2 (RX/OR ONLY) (NEEDLE) ×2 IMPLANT
NEEDLE 18GX1X1/2 (RX/OR ONLY) (NEEDLE) ×3 IMPLANT
NS IRRIG 1000ML POUR BTL (IV SOLUTION) ×3 IMPLANT
PACK CV ACCESS (CUSTOM PROCEDURE TRAY) ×3 IMPLANT
PAD ARMBOARD 7.5X6 YLW CONV (MISCELLANEOUS) ×6 IMPLANT
SLING ARM FOAM STRAP LRG (SOFTGOODS) IMPLANT
SLING ARM FOAM STRAP MED (SOFTGOODS) IMPLANT
STAPLER VISISTAT 35W (STAPLE) ×1 IMPLANT
STOPCOCK 4 WAY LG BORE MALE ST (IV SETS) ×2 IMPLANT
STRIP CLOSURE SKIN 1/2X4 (GAUZE/BANDAGES/DRESSINGS) ×3 IMPLANT
SUT ETHILON 3 0 PS 1 (SUTURE) ×1 IMPLANT
SUT MNCRL AB 4-0 PS2 18 (SUTURE) ×3 IMPLANT
SUT PROLENE 3 0 SH DA (SUTURE) ×1 IMPLANT
SUT PROLENE 5 0 C 1 24 (SUTURE) ×1 IMPLANT
SUT PROLENE 6 0 BV (SUTURE) ×4 IMPLANT
SUT VIC AB 2-0 CT1 27 (SUTURE) ×1
SUT VIC AB 2-0 CT1 TAPERPNT 27 (SUTURE) IMPLANT
SUT VIC AB 3-0 SH 27 (SUTURE) ×1
SUT VIC AB 3-0 SH 27X BRD (SUTURE) ×2 IMPLANT
SWAB CULTURE ESWAB REG 1ML (MISCELLANEOUS) ×2 IMPLANT
SYR 20ML LL LF (SYRINGE) ×1 IMPLANT
SYR 3ML LL SCALE MARK (SYRINGE) ×3 IMPLANT
TAPE CLOTH 4X10 WHT NS (GAUZE/BANDAGES/DRESSINGS) ×1 IMPLANT
TOWEL GREEN STERILE (TOWEL DISPOSABLE) ×4 IMPLANT
UNDERPAD 30X36 HEAVY ABSORB (UNDERPADS AND DIAPERS) ×3 IMPLANT
WATER STERILE IRR 1000ML POUR (IV SOLUTION) ×3 IMPLANT
WIRE AMPLATZ SS-J .035X180CM (WIRE) ×1 IMPLANT

## 2022-07-11 NOTE — Progress Notes (Signed)
Pt receives out-pt HD at [facility] on MWF. Pt arrives at 11:15 for 11:30 chair time. Will assist as needed.   Eero Dini Renal Navigator [phone] 

## 2022-07-11 NOTE — Progress Notes (Signed)
[facility] Progress Note   Subjective:   s/p OR this am for excision of avf ulcer, unfortunately suffered brief cardiac arrest after receiving neosynephrine and fentanyl. Now extubated and ICU. She endorses hunger, does not recall what happened.  Objective Vitals:   07/11/22 1015 07/11/22 1030 07/11/22 1045 07/11/22 1100  BP:      Pulse:  79 81 78  Resp:  16 12 10   Temp: 97.9 F (36.6 C)     TempSrc: Oral     SpO2:  94% 97% 97%  Weight:      Height:       Physical Exam General: chronically ill but nontoxic appearing, sitting up in bed Heart: RRR Lungs:clear ant Abdomen: soft, obese Extremities:no edema Dialysis Access: lue avf dressing in place, rt fem temp HD catheter  Additional Objective Labs: Basic Metabolic Panel: Recent Labs  Lab 07/08/22 0936 07/09/22 0521 07/10/22 0221 07/10/22 1025 07/11/22 0329  NA 136   < > 133* 136 136  K 3.9   < > 3.5 3.7 3.6  CL 96*   < > 94* 95* 94*  CO2 27   < > 24 26 25   GLUCOSE 136*   < > 131* 129* 123*  BUN 44*   < > 33* 37* 45*  CREATININE 8.20*   < > 6.37* 6.86* 7.96*  CALCIUM 8.9   < > 9.2 9.6 9.7  PHOS 5.2*  --   --  4.2 4.0   < > = values in this interval not displayed.   Liver Function Tests: Recent Labs  Lab 07/08/22 0936 07/09/22 0521 07/10/22 0221 07/10/22 1025 07/11/22 0329  AST 17 17 24   --   --   ALT 18 18 20   --   --   ALKPHOS 55 62 76  --   --   BILITOT 1.4* 2.7* 3.6*  --   --   PROT 5.9* 6.2* 6.1*  --   --   ALBUMIN 2.8* 2.9* 2.8* 2.8* 2.8*   Recent Labs  Lab 07/07/22 1108  LIPASE 18   CBC: Recent Labs  Lab 07/07/22 1215 07/07/22 2320 07/08/22 0936 07/09/22 0521 07/10/22 0221 07/10/22 1025 07/11/22 0329  WBC 6.7 8.4 9.6 10.2 10.8* 10.5 10.5  NEUTROABS 5.9 7.6  --   --   --   --   --   HGB 6.0* 6.7* 6.1* 7.5* 7.1* 7.4* 7.0*  HCT 19.5* 21.4* 19.6* 23.6* 22.0* 23.0* 20.9*  MCV 91.1 90.7 89.9 90.1 87.6 87.5 86.7  PLT 103* 125* 97* 95* 106* 96* 126*   Blood Culture     Component Value Date/Time   SDES BLOOD LEFT ARM 07/10/2022 1636   SDES BLOOD RIGHT WRIST 07/10/2022 1636   SPECREQUEST  07/10/2022 1636    BOTTLES DRAWN AEROBIC AND ANAEROBIC Blood Culture results may not be optimal due to an inadequate volume of blood received in culture bottles   SPECREQUEST  07/10/2022 1636    BOTTLES DRAWN AEROBIC AND ANAEROBIC Blood Culture adequate volume   CULT  07/10/2022 1636    NO GROWTH < 24 HOURS Performed at [facility], [place] [address] [place], [place] [address]    CULT  07/10/2022 1636    NO GROWTH < 24 HOURS Performed at [facility], [place] [address], [place], [place] [address]    REPTSTATUS PENDING 07/10/2022 1636   REPTSTATUS PENDING 07/10/2022 1636    Cardiac Enzymes: No results for input(s): "CKTOTAL", "CKMB", "CKMBINDEX", "TROPONINI" in the  last 168 hours. CBG: Recent Labs  Lab 07/11/22 1011  GLUCAP 144*   Iron Studies:  Recent Labs    07/09/22 0521  IRON 17*  TIBC 162*  FERRITIN 1,321*   @lablastinr3 @ Studies/Results: No results found. Medications:  sodium chloride     vancomycin      calcitRIOL  2.5 mcg Oral Q M,W,F-HD   Chlorhexidine Gluconate Cloth  6 each Topical Q0600   Chlorhexidine Gluconate Cloth  6 each Topical Q0600   cinacalcet  90 mg Oral Q M,W,F-HD   lanthanum  1,000 mg Oral TID WC   multivitamin  1 tablet Oral QHS   pantoprazole  40 mg Oral Daily   polyethylene glycol  17 g Oral Daily    Assessment/Recommendations:    ESRD:  -outpatient orders: [facility].  4 hours 15 minutes.  MWF.  EDW 102 kg.  Revaclear 300.  400/500.  2K, 2.5 Cal.  Meds: Calcitriol 2.5 mcg 3 times weekly, Sensipar 90 mg 3 times weekly, Mircera 200 mg every 2 weeks (last dose on Monday 7/10).  No heparin -HD today (bedside in ICU) via rt fem temp line (placed 7/17 in OR)   Sepsis, +MRSA bacteremia -ID following - vanc added, cont CTX; bcx pos on 7/14, repeat sent 7/16, will need TEE -avoid placing TDC , has temp HD  catheter until cultures clear -intraop gram stain+culture sent 7/17  Brief bradycardic arrest 07/11/22 -possible related to adverse effect of neosynephrine and fentanyl. Extubated in pacu   Abdominal pain, early/partial SBO -mgmt per primary service, appears improved   Volume/ hypertension: EDW 102kg. Attempt to achieve EDW as tolerated   Anemia of Chronic Kidney Disease, acute anemia: Hemoglobin improved to 7s s/p pRBC, cont to monitor -FOBT negative, transfused for Hgb <7 -iron sat 10% but hold IV iron in setting of bacteremia -last dose of mircera per outpatient unit was 7/10   Secondary Hyperparathyroidism/Hyperphosphatemia: on calcitriol and sensipar, will start renvela if po4 trends up   Vascular access: Ulceration of left upper extremity aVF status post excision and stent graft.  Status post right femoral temp HD line on 7/17 in the OR   Additional recommendations: - Dose all meds for creatinine clearance < 10 ml/min  - Unless absolutely necessary, no MRIs with gadolinium.  - Implement save arm precautions.  Prefer needle sticks in the dorsum of the hands or wrists.  No blood pressure measurements in arm. - If blood transfusion is requested during hemodialysis sessions, please alert [place] prior to the session.  - If a hemodialysis catheter line culture is requested, please alert [place] as only hemodialysis nurses are able to collect those specimens.

## 2022-07-11 NOTE — Anesthesia Procedure Notes (Signed)
Procedure Name: LMA Insertion Date/Time: 07/11/2022 7:55 AM  Performed by: Maude Leriche, CRNAPre-anesthesia Checklist: Patient identified, Emergency Drugs available, Suction available and Patient being monitored Patient Re-evaluated:Patient Re-evaluated prior to induction Oxygen Delivery Method: Circle system utilized Preoxygenation: Pre-oxygenation with 100% oxygen Induction Type: IV induction Ventilation: Mask ventilation without difficulty LMA: LMA inserted LMA Size: 4.0 Number of attempts: 1 Placement Confirmation: positive ETCO2 and breath sounds checked- equal and bilateral Tube secured with: Tape Dental Injury: Teeth and Oropharynx as per pre-operative assessment

## 2022-07-11 NOTE — Op Note (Signed)
DATE OF SERVICE: 07/11/2022  PATIENT:  Rebekah Burton  46 y.o. 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 2) excision of infected left arm arteriovenous fistula and stent graft material  SURGEON:  Surgeon(s) and Role:    * Cherre Robins, MD - Primary  ASSISTANT: Leontine Locket, PA-C  An experienced assistant was required given the complexity of this procedure and the standard of surgical care. My assistant helped with exposure through counter tension, suctioning, ligation and retraction to better visualize the surgical field.  My assistant expedited sewing during the case by following my sutures. Wherever I use the term "we" in the report, my assistant actively helped me with that portion of the procedure.  ANESTHESIA:   general  EBL: 186mL  BLOOD ADMINISTERED:none  DRAINS: none   LOCAL MEDICATIONS USED:  NONE  SPECIMEN:  culture and gram stain from abscess pocket; fistula for culture and gram stain  COUNTS: confirmed correct.  TOURNIQUET:  none  PATIENT DISPOSITION:  PACU - hemodynamically stable.   Delay start of Pharmacological VTE agent (>24hrs) due to surgical blood loss or risk of bleeding: no  INDICATION FOR PROCEDURE: Rebekah Burton is a 46 y.o. female with MRSA bacteremia and ulcerated left arm arteriovenous fistula with bleeding. After careful discussion of risks, benefits, and alternatives the patient was offered excision of fistula and placement of temporary catheter. The patient understood and wished to proceed.  OPERATIVE FINDINGS: Patient suffered intraoperative cardiac arrest immediately after placement of percutaneous temporary catheter.  ROSC achieved.  I felt it unsafe to leave the ulcerated, bleeding fistula unaddressed and so this was  excised.  A pocket of purulence was encountered and the excision.  Cultures were taken.  DESCRIPTION OF PROCEDURE: After identification of the patient in the pre-operative holding area, the patient was transferred to the operating room. The patient was positioned supine on the operating room table. Anesthesia was induced. The right groin and left arm were prepped and draped in standard fashion. A surgical pause was performed confirming correct patient, procedure, and operative location.  Using ultrasound-guided access, the right common femoral vein was accessed with 18-gauge needle.  A J-wire was advanced into the inferior vena cava.  The needle was withdrawn.  Over the wire the tract was serially dilated.  I was not able to advance a prepped trialysis catheter into the common femoral vein because the wire was not stiff enough.  I reintroduced the dilator and introduced an Amplatz wire into the inferior vena cava.  The catheter then tracked easily into the inferior vena cava.  I tested the catheter and found it to flow easily.  The catheter was sutured in 2 points to secure it to the skin.  The catheter was heparin locked.  Caps were applied to the catheter.  At this point we were reprepping to prepare for the left arm portion of the case.  The patient became bradycardic.  I noted no femoral pulse.  ACLS was initiated.  ROSC was achieved after several minutes.  See nursing documentation for full arrest details.  I felt it unsafe to proceed to the ICU directly given her ongoing bleeding risk from her left arm.  The left arm was prepped expeditiously.  A and ellipse incision was made around the ulcerated fistula.  I isolated the inflow and ligated this doubly  with 0 silk suture.  The fistula was skeletonized from this point centrally.  I encountered a pocket of purulence.  Cultures were taken.  I was able to get up above the level of covered stenting and clamp the outflow.  The fistula was excised.  The  outflow was ligated using 3-0 Prolene in 2 layers.  The inflow was ligated again using the remainder of the 3-0 Prolene suture.  Hemostasis was achieved in the surgical beds.  The wound was closed using 2-0 Vicryl and surgical stapler.  A bulky dressing was applied.  Upon completion of the case instrument and sharps counts were confirmed correct. The patient was transferred to the ICU in critical condition.  She is intubated.  No family is present to discuss intraoperative results. I was present for all portions of the procedure.  [name], MD [facility]ascular and Vein Specialists of [facility] Phone Number: [phone] 07/11/2022 9:18 AM

## 2022-07-11 NOTE — Consult Note (Addendum)
NAME:  Rebekah Burton, MRN:  [id number], DOB:  04-12-1976, LOS: 3 ADMISSION DATE:  07/07/2022, CONSULTATION DATE:  07/11/22 REFERRING MD:  Stanford Breed CHIEF COMPLAINT:  Resp Arrest   History of Present Illness:  Rebekah Burton is a 46 y.o. female who has a complex PMH as outlined below including but not limited to ESRD on HD MWF, blindness, HTN, PAD, bilateral transmetatarsal amputations, tobacco abuse.  She presented to the emergency department 7/14 with abdominal pain.  She was found to have anemia with a hemoglobin of 6.7 along with possible left-sided pyelonephritis.  She was started on ceftriaxone and admitted by hospital service. She was found to have MRSA bacteremia presumed secondary to a left upper extremity fistula which had recently started bleeding as an outpatient.  She was seen by infectious disease who started her on vancomycin and discontinued ceftriaxone. Vascular surgery was consulted for the left arm fistula and deemed it to be ulcerated.  She was taken to the OR on 7/17 for placement of right common femoral vein percutaneous dialysis catheter and excision of infected left arm AV fistula and stent graft material. Intra-Op, she had a brief bradycardic arrest with ROSC after a few minutes and was intubated. Post op, she was successfully extubated in PACU. She was transferred to the ICU for observation at least short term and PCCM asked to see in consultation.   Pertinent  Medical History:  has Ischemic ulcer of left foot ([place]); ESRD (end stage renal disease) ([place]); Essential hypertension; Blind in both eyes; PAD (peripheral artery disease) ([place]); Post-operative pain; Acute blood loss anemia; S/P transmetatarsal amputation of foot, left ([place]); Cellulitis of left lower extremity; Breast abscess; COVID-19 virus infection; Breast abscess of female; Left breast abscess; Poor social situation; Complication of vascular access for dialysis; Acquired abnormality of superior vena cava; Hypokalemia; Tobacco  use disorder; GERD (gastroesophageal reflux disease); Symptomatic anemia; Sepsis ([place]); Acute pyelonephritis; and Hypoalbuminemia due to protein-calorie malnutrition ([place]) on their problem list.  Significant Hospital Events: Including procedures, antibiotic start and stop dates in addition to other pertinent events   7/14 admit 7/17 taken to OR for repair of ulcerated LUE fistula, placement of temp dialysis catheter, had brief bradycardic arrest; therefore, intubated and transferred to ICU.  Interim History / Subjective:  Awake on 2L, talking, feels "OK". Was not aware that she had brief arrest. Currently asymptomatic, feels at her baseline.  Objective:  Blood pressure (!) 162/108, pulse 93, temperature 97.9 F (36.6 C), temperature source Oral, resp. rate 17, height 5\' 4"  (1.626 m), weight 99.5 kg, SpO2 95 %.        Intake/Output Summary (Last 24 hours) at 07/11/2022 1028 Last data filed at 07/11/2022 0935 Gross per 24 hour  Intake 500 ml  Output 210 ml  Net 290 ml   Filed Weights   07/09/22 0803 07/09/22 1430 07/09/22 2318  Weight: 100.9 kg 100.9 kg 99.5 kg    Examination: General: Adult female, resting in bed, in NAD. Neuro: A&O x 3, no deficits. HEENT: [place]/AT. Sclerae anicteric. Legally blind. Cardiovascular: RRR, soft murmur.  Lungs: Respirations even and unlabored.  CTA bilaterally, No W/R/R. Abdomen: BS x 4, soft, NT/ND.  Musculoskeletal: Bilateral transmetatarsal amputations, no edema.  Skin: Intact, warm, no rashes.   Labs/imaging personally reviewed:  CT A/P 7/13 > numerous dilated bowel loops with no definite transition point.  Focal heterogenous area of the interpolar region of the left kidney possibly due to infection the renal mass not excluded.  Small right pleural effusion  with small volume ascites.  Cystic lesion of the tail of the pancreas measuring 17 mm.  Indeterminate enhancing focus in the left pelvis likely in the left ovary.  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 Pelvic ultrasound 7/13 > left ovary is visualized, torsion unlikely.  Large volume ascites. Echo 7/14 > EF 65 to 70%, G2DD, LA severely dilated, RA mildly dilated, MV degenerative with heavy calcification of the chordae tendon.  No obvious vegetations.   Assessment & Plan:   Brief bradycardic arrest - unclear etiology. Could have been adverse effect of neosynephrine and fentanyl which she apparently received just prior to code. Currently hemodynamically stable. - Supportive care. - Get EKG and trend troponin for completeness. - Check BMP, Mag.  Acute hypoxic respiratory failure - s/p intubation in OR after brief bradycardic arrest but later extubated in PACU. - Continue supplemental O2 as needed to maintain SpO2 > 92%. - Bronchial hygiene. - Avoid sedating meds.  MRSA bacteremia - secondary to left upper extremity fistula.  Initially on ceftriaxone and later switched to vancomycin per ID recommendations.  Echo demonstrated some calcifications on chordae tendinae of MV but no obvious vegetations though ID still concerned regarding endocarditis. - ID following, appreciate the assistance. - Continue vancomycin per ID. - Follow-up on repeat blood cultures. - Needs TEE, cardiology aware.  Infected LUE AVF - s/p excision of infected left arm AV fistula and stent graft material. - Vascular surgery following.  ESRD on HD MWF. - Nephrology following, appreciate the assistance. - Temporary dialysis catheter placed for temporary HD until bacteremia clears then will need more permanent access.  Acute blood loss anemia -secondary to bleeding left upper extremity fistula. S/p 2u PRBC with Hgb currently stable. - Transfuse for Hgb < 7. - Monitor for further bleeding.  Early SBO - appears improved. Per notes, pt with BM 7/16. - Supportive care.  Pancreatic cyst. - MRCP when able.  Hx HTN, PAD. - Hold home Coreg, Clonidine. -  Hydralazine PRN.  Hx GERD. - Continue PPI.  Hx Tobacco use disorder. - Tobacco cessation counseling.  Hx acquired abnormality of SVC - CT with similar findings of occlusion of prox SMA with distal reconstitution of flow. - Hold home Apixaban given anemia, resume when able. - Supportive care.  Monitor in ICU for a few hours. If remains stable and EKG without any concern, will likely transfer out of ICU later this afternoon. Communicated this with Dr. Wyline Copas of [facility].   Best practice (evaluated daily):  Diet/type: NPO DVT prophylaxis: not indicated - anemia and active bleeding from LUE AVF on admit GI prophylaxis: PPI Lines: Dialysis Catheter and Arterial Line Foley:  N/A Code Status:  full code Last date of multidisciplinary goals of care discussion: None yet.  Labs   CBC: Recent Labs  Lab 07/07/22 1215 07/07/22 2320 07/08/22 0936 07/09/22 0521 07/10/22 0221 07/10/22 1025 07/11/22 0329  WBC 6.7 8.4 9.6 10.2 10.8* 10.5 10.5  NEUTROABS 5.9 7.6  --   --   --   --   --   HGB 6.0* 6.7* 6.1* 7.5* 7.1* 7.4* 7.0*  HCT 19.5* 21.4* 19.6* 23.6* 22.0* 23.0* 20.9*  MCV 91.1 90.7 89.9 90.1 87.6 87.5 86.7  PLT 103* 125* 97* 95* 106* 96* 126*    Basic Metabolic Panel: Recent Labs  Lab 07/07/22 2320 07/08/22 0936 07/09/22 0521 07/10/22 0221 07/10/22 1025 07/11/22 0329  NA 138 136 139 133* 136 136  K 3.7 3.9 4.1  3.5 3.7 3.6  CL 97* 96* 98 94* 95* 94*  CO2 27 27 27 24 26 25   GLUCOSE 123* 136* 115* 131* 129* 123*  BUN 37* 44* 52* 33* 37* 45*  CREATININE 7.34* 8.20* 9.15* 6.37* 6.86* 7.96*  CALCIUM 9.1 8.9 9.5 9.2 9.6 9.7  MG 2.1 1.9  --   --   --   --   PHOS  --  5.2*  --   --  4.2 4.0   GFR: Estimated Creatinine Clearance: 10.4 mL/min (A) (by C-G formula based on SCr of 7.96 mg/dL (H)). Recent Labs  Lab 07/09/22 0521 07/10/22 0221 07/10/22 1025 07/11/22 0329  WBC 10.2 10.8* 10.5 10.5    Liver Function Tests: Recent Labs  Lab 07/07/22 1108 07/08/22 0936  07/09/22 0521 07/10/22 0221 07/10/22 1025 07/11/22 0329  AST 24 17 17 24   --   --   ALT 21 18 18 20   --   --   ALKPHOS 59 55 62 76  --   --   BILITOT 1.2 1.4* 2.7* 3.6*  --   --   PROT 6.1* 5.9* 6.2* 6.1*  --   --   ALBUMIN 3.0* 2.8* 2.9* 2.8* 2.8* 2.8*   Recent Labs  Lab 07/07/22 1108  LIPASE 18   No results for input(s): "AMMONIA" in the last 168 hours.  ABG    Component Value Date/Time   TCO2 29 10/26/2021 0834     Coagulation Profile: No results for input(s): "INR", "PROTIME" in the last 168 hours.  Cardiac Enzymes: No results for input(s): "CKTOTAL", "CKMB", "CKMBINDEX", "TROPONINI" in the last 168 hours.  HbA1C: Hgb A1c MFr Bld  Date/Time Value Ref Range Status  01/16/2019 11:22 PM 5.2 4.8 - 5.6 % Final    Comment:    (NOTE) Pre diabetes:          5.7%-6.4% Diabetes:              >6.4% Glycemic control for   <7.0% adults with diabetes     CBG: Recent Labs  Lab 07/11/22 1011  GLUCAP 144*    Review of Systems:   Unable to obtain as pt is encephalopathic.  Past Medical History:  She,  has a past medical history of Anemia, Blind, Blind, ESRD (end stage renal disease) ([place]), Foot ulceration ([place]) (12/2018), History of transmetatarsal amputation of left foot ([place]), History of transmetatarsal amputation of right foot ([place]), Hypertension, PAD (peripheral artery disease) ([place]), and Tobacco abuse.   Surgical History:   Past Surgical History:  Procedure Laterality Date   ABDOMINAL AORTOGRAM W/LOWER EXTREMITY Bilateral 01/17/2019   Procedure: ABDOMINAL AORTOGRAM W/LOWER EXTREMITY;  Surgeon: Waynetta Sandy, MD;  Location: [place] [facility];  Service: Cardiovascular;  Laterality: Bilateral;   ABDOMINAL HYSTERECTOMY     ENDARTERECTOMY FEMORAL Left 01/18/2019   Procedure: ENDARTERECTOMY FEMORAL with Perfundaplasty;  Surgeon: Rosetta Posner, MD;  Location: [place];  Service: Vascular;  Laterality: Left;   FEMORAL-POPLITEAL BYPASS GRAFT Left 01/18/2019    Procedure: Left  FEMORAL- to  Below the Knee POPLITEAL ARTERY bypass using reversed safenous vein.;  Surgeon: Rosetta Posner, MD;  Location: [facility];  Service: Vascular;  Laterality: Left;   INCISION AND DRAINAGE ABSCESS Left 01/15/2021   Procedure: INCISION AND DRAINAGE BREAST ABSCESS;  Surgeon: Dwan Bolt, MD;  Location: [place];  Service: General;  Laterality: Left;   INCISION AND DRAINAGE ABSCESS Left 02/25/2021   Procedure: INCISION AND DRAINAGE BREAST  ABSCESS;  Surgeon: Zenia Resides,  Blanchard Mane, MD;  Location: [facility];  Service: General;  Laterality: Left;   INCISION AND DRAINAGE ABSCESS Left 10/26/2021   Procedure: INCISION AND DRAINAGE OF RECURRENT LEFT BREAST ABSCESS;  Surgeon: Donnie Mesa, MD;  Location: [facility];  Service: General;  Laterality: Left;   IR DIALY SHUNT INTRO NEEDLE/INTRACATH INITIAL W/IMG LEFT Left 01/28/2021   IR THROMBECTOMY AV FISTULA W/THROMBOLYSIS/PTA/STENT INC/SHUNT/IMG LT Left 10/23/2020   IR US GUIDE BX ASP/DRAIN  01/28/2021   IR US GUIDE VASC ACCESS LEFT  01/26/2021   toe removal Right    All toes on right foot have been removed.    TRANSMETATARSAL AMPUTATION Left 01/18/2019   Procedure: TRANSMETATARSAL AMPUTATION;  Surgeon: Rosetta Posner, MD;  Location: [facility] OR;  Service: Vascular;  Laterality: Left;     Social History:   reports that she quit smoking about 3 years ago. Her smoking use included cigarettes. She has a 10.00 pack-year smoking history. She has never used smokeless tobacco. She reports that she does not currently use drugs after having used the following drugs: Marijuana. She reports that she does not drink alcohol.   Family History:  Her family history is negative for Peripheral Artery Disease and Kidney failure.   Allergies No Known Allergies   Home Medications  Prior to Admission medications   Medication Sig Start Date End Date Taking? Authorizing Provider  b complex-vitamin c-folic acid (NEPHRO-VITE) 0.8 MG TABS tablet Take 1 tablet by mouth every Monday,  Wednesday, and Friday with hemodialysis. 03/17/21  Yes Samella Parr, NP  carvedilol (COREG) 25 MG tablet Take 1 tablet (25 mg total) by mouth 2 (two) times daily. 03/17/21  Yes Samella Parr, NP  cloNIDine (CATAPRES) 0.3 MG tablet Take 1 tablet (0.3 mg total) by mouth 3 (three) times daily. 03/17/21  Yes Samella Parr, NP  ibuprofen (ADVIL) 600 MG tablet Take 1 tablet (600 mg total) by mouth 3 (three) times daily. Patient taking differently: Take 600 mg by mouth every 8 (eight) hours as needed (pain). 03/17/21  Yes Samella Parr, NP  lanthanum (FOSRENOL) 1000 MG chewable tablet Chew 1 tablet (1,000 mg total) by mouth 3 (three) times daily with meals. 03/18/21  Yes Samella Parr, NP  pantoprazole (PROTONIX) 40 MG tablet Take 1 tablet (40 mg total) by mouth daily. 03/17/21  Yes Samella Parr, NP  senna (SENOKOT) 8.6 MG TABS tablet Take 1 tablet (8.6 mg total) by mouth 2 (two) times daily. 03/17/21  Yes Samella Parr, NP  traZODone (DESYREL) 150 MG tablet Take 150 mg by mouth at bedtime. 05/01/21  Yes [provider]  APIXABAN Arne Cleveland) VTE STARTER PACK (10MG  AND 5MG ) Take as directed on package: start with two-5mg  tablets twice daily for 7 days. On day 8, switch to one-5mg  tablet twice daily. Patient not taking: Reported on 07/07/2022 06/18/22   Little Ishikawa, MD      Montey Hora, [facility] For pager details, please see AMION or use Epic chat  After 1900, please call [facility] for cross coverage needs 07/11/2022, 10:28 AM

## 2022-07-11 NOTE — Anesthesia Procedure Notes (Signed)
Procedure Name: Intubation Date/Time: 07/11/2022 8:37 AM  Performed by: Maude Leriche, CRNAPre-anesthesia Checklist: Patient identified, Emergency Drugs available, Suction available and Patient being monitored Patient Re-evaluated:Patient Re-evaluated prior to induction Oxygen Delivery Method: Circle system utilized Preoxygenation: Pre-oxygenation with 100% oxygen Induction Type: Rapid sequence Ventilation: Mask ventilation without difficulty Laryngoscope Size: Mac and 3 Grade View: Grade I Tube type: Oral Tube size: 7.0 mm Number of attempts: 1 Airway Equipment and Method: Stylet Placement Confirmation: ETT inserted through vocal cords under direct vision, positive ETCO2 and breath sounds checked- equal and bilateral Secured at: 21 cm Tube secured with: Tape Dental Injury: Teeth and Oropharynx as per pre-operative assessment  Comments: Emergent removal of LMA for PEA and intubation by Dr. Marcie Bal

## 2022-07-11 NOTE — Procedures (Signed)
HD Note  Patient completed 4 hours of dialysis without complications. The HD catheter in the right groin worked without issue.  The Patient changed positions many times without any changes in the arterial or venous pressures. UF 3L

## 2022-07-11 NOTE — Anesthesia Preprocedure Evaluation (Signed)
Anesthesia Evaluation  Patient identified by MRN, date of birth, ID band Patient awake    Reviewed: Allergy & Precautions, H&P , NPO status , Patient's Chart, lab work & pertinent test results  Airway Mallampati: II   Neck ROM: full    Dental   Pulmonary former smoker,    breath sounds clear to auscultation       Cardiovascular hypertension, + Peripheral Vascular Disease   Rhythm:regular Rate:Normal     Neuro/Psych    GI/Hepatic GERD  ,  Endo/Other    Renal/GU ESRF and DialysisRenal disease     Musculoskeletal   Abdominal   Peds  Hematology  (+) Blood dyscrasia, anemia ,   Anesthesia Other Findings   Reproductive/Obstetrics                             Anesthesia Physical Anesthesia Plan  ASA: 3  Anesthesia Plan: General   Post-op Pain Management:    Induction: Intravenous  PONV Risk Score and Plan: 3 and Ondansetron, Dexamethasone, Midazolam and Treatment may vary due to age or medical condition  Airway Management Planned: LMA  Additional Equipment:   Intra-op Plan:   Post-operative Plan: Extubation in OR  Informed Consent: I have reviewed the patients History and Physical, chart, labs and discussed the procedure including the risks, benefits and alternatives for the proposed anesthesia with the patient or authorized representative who has indicated his/her understanding and acceptance.     Dental advisory given  Plan Discussed with: CRNA, Anesthesiologist and Surgeon  Anesthesia Plan Comments:         Anesthesia Quick Evaluation

## 2022-07-11 NOTE — Progress Notes (Signed)
[facility]ious [facility]   Reason for visit: Follow up on MRSA bacteremia  Interval History: now s/p infected left arm arteriovenous fistula and placement of a dialysis catheter; afebrile > 48 hours; WBC 10.5 Had a PEA arrest and now in .     Physical Exam: Constitutional:  Vitals:   07/11/22 1251 07/11/22 1305  BP: (!) 119/55 (!) 129/98  Pulse: 86 81  Resp: 16 (!) 24  Temp:    SpO2: 90% 100%   patient appears in NAD Respiratory: Normal respiratory effort; CTA B Cardiovascular: RRR GI: soft, nt, nd  Review of Systems: Constitutional: negative for fevers and chills  Lab Results  Component Value Date   WBC 10.5 07/11/2022   HGB 7.0 (L) 07/11/2022   HCT 20.9 (L) 07/11/2022   MCV 86.7 07/11/2022   PLT 126 (L) 07/11/2022    Lab Results  Component Value Date   CREATININE 8.39 (H) 07/11/2022   BUN 51 (H) 07/11/2022   NA 135 07/11/2022   K 3.8 07/11/2022   CL 91 (L) 07/11/2022   CO2 24 07/11/2022    Lab Results  Component Value Date   ALT 20 07/10/2022   AST 24 07/10/2022   ALKPHOS 76 07/10/2022     Microbiology: Recent Results (from the past 240 hour(s))  Culture, blood (Routine X 2) w Reflex to ID Panel     Status: Abnormal   Collection Time: 07/08/22  9:36 AM   Specimen: BLOOD  Result Value Ref Range Status   Specimen Description BLOOD RIGHT ANTECUBITAL  Final   Special Requests   Final    BOTTLES DRAWN AEROBIC AND ANAEROBIC Blood Culture results may not be optimal due to an excessive volume of blood received in culture bottles   Culture  Setup Time   Final    GRAM POSITIVE COCCI Gram Stain Report Called to,Read Back By and Verified With: MUSE,J ON 07/08/22 AT 2330 BY LOY,C AEROBIC AND ANAEROBIC BOTTLES PERFORMED AT [facility] CRITICAL RESULT CALLED TO, READ BACK BY AND VERIFIED WITH: J MUSE,RN@0623  07/09/22 [place] Performed at [facility], [place] [address], [place], [place] [address]    Culture METHICILLIN RESISTANT STAPHYLOCOCCUS AUREUS (A)   Final   Report Status 07/11/2022 FINAL  Final   Organism ID, Bacteria METHICILLIN RESISTANT STAPHYLOCOCCUS AUREUS  Final      Susceptibility   Methicillin resistant staphylococcus aureus - MIC*    CIPROFLOXACIN <=0.5 SENSITIVE Sensitive     ERYTHROMYCIN >=8 RESISTANT Resistant     GENTAMICIN <=0.5 SENSITIVE Sensitive     OXACILLIN >=4 RESISTANT Resistant     TETRACYCLINE <=1 SENSITIVE Sensitive     VANCOMYCIN 1 SENSITIVE Sensitive     TRIMETH/SULFA <=10 SENSITIVE Sensitive     CLINDAMYCIN <=0.25 SENSITIVE Sensitive     RIFAMPIN <=0.5 SENSITIVE Sensitive     Inducible Clindamycin NEGATIVE Sensitive     * METHICILLIN RESISTANT STAPHYLOCOCCUS AUREUS  Culture, blood (Routine X 2) w Reflex to ID Panel     Status: Abnormal   Collection Time: 07/08/22  9:36 AM   Specimen: BLOOD RIGHT HAND  Result Value Ref Range Status   Specimen Description   Final    BLOOD RIGHT HAND BOTTLES DRAWN AEROBIC AND ANAEROBIC Performed at [facility], [address] [place], [place] [address]    Special Requests   Final    Blood Culture adequate volume Performed at [facility], [address], [place], [place] [address]    Culture  Setup  Time   Final    GRAM POSITIVE COCCI Gram Stain Report Called to,Read Back By and Verified With: MUSE, J ON 07/08/22 AT 2330 BY LOY,C AEROBIC AND ANAEROBIC BOTTLES PERFORMED AT [facility] Performed at [facility], [address], [place], [place] [address]    Culture (A)  Final    STAPHYLOCOCCUS AUREUS SUSCEPTIBILITIES PERFORMED ON PREVIOUS CULTURE WITHIN THE LAST 5 DAYS. Performed at [facility], [place] [address], [place], [place] [address]    Report Status 07/11/2022 FINAL  Final  Blood Culture ID Panel (Reflexed)     Status: Abnormal   Collection Time: 07/08/22  9:36 AM  Result Value Ref Range Status   Enterococcus faecalis NOT DETECTED NOT DETECTED Final   Enterococcus Faecium NOT DETECTED NOT DETECTED Final   Listeria monocytogenes NOT DETECTED NOT DETECTED Final    Staphylococcus species DETECTED (A) NOT DETECTED Final    Comment: CRITICAL RESULT CALLED TO, READ BACK BY AND VERIFIED WITH: J MUSE,RN@0623  07/09/22 [place]    Staphylococcus aureus (BCID) DETECTED (A) NOT DETECTED Final    Comment: Methicillin (oxacillin)-resistant Staphylococcus aureus (MRSA). MRSA is predictably resistant to beta-lactam antibiotics (except ceftaroline). Preferred therapy is vancomycin unless clinically contraindicated. Patient requires contact precautions if  hospitalized. CRITICAL RESULT CALLED TO, READ BACK BY AND VERIFIED WITH: J MUSE,RN@0623  07/09/22 [place]    Staphylococcus epidermidis NOT DETECTED NOT DETECTED Final   Staphylococcus lugdunensis NOT DETECTED NOT DETECTED Final   Streptococcus species NOT DETECTED NOT DETECTED Final   Streptococcus agalactiae NOT DETECTED NOT DETECTED Final   Streptococcus pneumoniae NOT DETECTED NOT DETECTED Final   Streptococcus pyogenes NOT DETECTED NOT DETECTED Final   A.calcoaceticus-baumannii NOT DETECTED NOT DETECTED Final   Bacteroides fragilis NOT DETECTED NOT DETECTED Final   Enterobacterales NOT DETECTED NOT DETECTED Final   Enterobacter cloacae complex NOT DETECTED NOT DETECTED Final   Escherichia coli NOT DETECTED NOT DETECTED Final   Klebsiella aerogenes NOT DETECTED NOT DETECTED Final   Klebsiella oxytoca NOT DETECTED NOT DETECTED Final   Klebsiella pneumoniae NOT DETECTED NOT DETECTED Final   Proteus species NOT DETECTED NOT DETECTED Final   Salmonella species NOT DETECTED NOT DETECTED Final   Serratia marcescens NOT DETECTED NOT DETECTED Final   Haemophilus influenzae NOT DETECTED NOT DETECTED Final   Neisseria meningitidis NOT DETECTED NOT DETECTED Final   Pseudomonas aeruginosa NOT DETECTED NOT DETECTED Final   Stenotrophomonas maltophilia NOT DETECTED NOT DETECTED Final   Candida albicans NOT DETECTED NOT DETECTED Final   Candida auris NOT DETECTED NOT DETECTED Final   Candida glabrata NOT DETECTED NOT DETECTED  Final   Candida krusei NOT DETECTED NOT DETECTED Final   Candida parapsilosis NOT DETECTED NOT DETECTED Final   Candida tropicalis NOT DETECTED NOT DETECTED Final   Cryptococcus neoformans/gattii NOT DETECTED NOT DETECTED Final   Meth resistant mecA/C and MREJ DETECTED (A) NOT DETECTED Final    Comment: CRITICAL RESULT CALLED TO, READ BACK BY AND VERIFIED WITH: J MUSE,RN@0623  07/09/22 [place] Performed at [facility], [address], [place], [place] [address]   Culture, blood (Routine X 2) w Reflex to ID Panel     Status: None (Preliminary result)   Collection Time: 07/10/22  4:36 PM   Specimen: BLOOD LEFT ARM  Result Value Ref Range Status   Specimen Description BLOOD LEFT ARM  Final   Special Requests   Final    BOTTLES DRAWN AEROBIC AND ANAEROBIC Blood Culture results may not be optimal due to an inadequate volume  of blood received in culture bottles   Culture   Final    NO GROWTH < 24 HOURS Performed at [facility], [place] [address], [place], [place] [address]    Report Status PENDING  Incomplete  Culture, blood (Routine X 2) w Reflex to ID Panel     Status: None (Preliminary result)   Collection Time: 07/10/22  4:36 PM   Specimen: BLOOD RIGHT WRIST  Result Value Ref Range Status   Specimen Description BLOOD RIGHT WRIST  Final   Special Requests   Final    BOTTLES DRAWN AEROBIC AND ANAEROBIC Blood Culture adequate volume   Culture   Final    NO GROWTH < 24 HOURS Performed at [facility], [place] [address], [place], [place] [address]    Report Status PENDING  Incomplete  Surgical pcr screen     Status: Abnormal   Collection Time: 07/11/22  6:06 AM   Specimen: Nasal Mucosa; Nasal Swab  Result Value Ref Range Status   MRSA, PCR POSITIVE (A) NEGATIVE Final   Staphylococcus aureus POSITIVE (A) NEGATIVE Final    Comment: RESULT CALLED TO, READ BACK BY AND VERIFIED WITH: RN SANTOS.A AT 0981 ON 07/11/2022 BY T.SAAD. (NOTE) The Xpert SA Assay (FDA approved for NASAL  specimens in patients 80 years of age and older), is one component of a comprehensive surveillance program. It is not intended to diagnose infection nor to guide or monitor treatment. Performed at [facility], [place] [address], [place],  [address]     Impression/Plan:  1. 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  Repeat blood cultures from yesterday remain ngtd.  4/4 bottles positive from 7/14.  On vancomycin and getting after dialysis.  Will need a prolonged course of antibiotics.  2.  Focal heterogenous lesion left kidney - ? If septic emboli.  Fistula vs valve. TEE planned.    3.  ESRD - on intermittent hemodialysis and getting vancomycin after dialysis on dialysis days.

## 2022-07-11 NOTE — Transfer of Care (Signed)
Immediate Anesthesia Transfer of Care Note  Patient: Rebekah Burton  Procedure(s) Performed: EXCISION OF INFECTED LEFT ARM ARTERIOVENOUS FISTULA (Left) INSERTION OF RIGHT FEMORAL TEMPORARY DIALYSIS CATHETER  Patient Location: ICU  Anesthesia Type:General  Level of Consciousness: awake, alert  and oriented  Airway & Oxygen Therapy: Patient Spontanous Breathing and Patient connected to face mask oxygen  Post-op Assessment: Report given to RN, Post -op Vital signs reviewed and stable, Patient moving all extremities X 4 and Patient able to stick tongue midline  Post vital signs: Reviewed  Last Vitals:  Vitals Value Taken Time  BP    Temp    Pulse    Resp    SpO2      Last Pain:  Vitals:   07/11/22 0406  TempSrc: Oral  PainSc:          Complications: No notable events documented.

## 2022-07-11 NOTE — Progress Notes (Signed)
[facility] PROGRESS NOTE  ASSESSMENT / PLAN: Rebekah Burton is a 46 y.o. female with ulcerated, bleeding left arm brachiocephalic arteriovenous fistula. Plan repair or excision in OR today. Plan to place percutaneous dialysis catheter in OR today. Will use this for dialysis until bacteremia clears.    SUBJECTIVE: No complaints. Reviewed operative plan today.  OBJECTIVE: BP 140/89 (BP Location: Right Wrist)   Pulse 94   Temp 98.4 F (36.9 C) (Oral)   Resp 20   Ht 5\' 4"  (1.626 m)   Wt 99.5 kg   SpO2 95%   BMI 37.65 kg/m   No acute distress Regular rate and rhythm Unlabored breathing Soft obese abdomen Bandaged AV fistula     Latest Ref Rng & Units 07/11/2022    3:29 AM 07/10/2022   10:25 AM 07/09/2022    5:21 AM  CBC  WBC 4.0 - 10.5 K/uL 10.5  10.5  10.2   Hemoglobin 12.0 - 15.0 g/dL 7.0  7.4  7.5   Hematocrit 36.0 - 46.0 % 20.9  23.0  23.6   Platelets 150 - 400 K/uL 126  96  95         Latest Ref Rng & Units 07/11/2022    3:29 AM 07/10/2022   10:25 AM 07/09/2022    5:21 AM  CMP  Glucose 70 - 99 mg/dL 123  129  115   BUN 6 - 20 mg/dL 45  37  52   Creatinine 0.44 - 1.00 mg/dL 7.96  6.86  9.15   Sodium 135 - 145 mmol/L 136  136  139   Potassium 3.5 - 5.1 mmol/L 3.6  3.7  4.1   Chloride 98 - 111 mmol/L 94  95  98   CO2 22 - 32 mmol/L 25  26  27    Calcium 8.9 - 10.3 mg/dL 9.7  9.6  9.5   Total Protein 6.5 - 8.1 g/dL   6.2   Total Bilirubin 0.3 - 1.2 mg/dL   2.7   Alkaline Phos 38 - 126 U/L   62   AST 15 - 41 U/L   17   ALT 0 - 44 U/L   18     Estimated Creatinine Clearance: 10.4 mL/min (A) (by C-G formula based on SCr of 7.96 mg/dL (H)).  [name], MD [facility] Phone Number: [phone] 07/11/2022 7:26 AM

## 2022-07-11 NOTE — Anesthesia Procedure Notes (Signed)
Arterial Line Insertion Start/End7/17/2023 8:35 AM, 07/11/2022 8:56 AM Performed by: anesthesiologist  Patient location: OR. Preanesthetic checklist: patient identified, IV checked, site marked, risks and benefits discussed, surgical consent, monitors and equipment checked, pre-op evaluation, timeout performed and anesthesia consent Emergency situation Lidocaine 1% used for infiltration Right, radial was placed Catheter size: 20 G Hand hygiene performed , maximum sterile barriers used  and Seldinger technique used  Attempts: 4 Procedure performed using ultrasound guided technique. Ultrasound Notes:anatomy identified, needle tip was noted to be adjacent to the nerve/plexus identified and no ultrasound evidence of intravascular and/or intraneural injection Following insertion, dressing applied and Biopatch. Post procedure assessment: normal and unchanged  Patient tolerated the procedure well with no immediate complications.

## 2022-07-12 ENCOUNTER — Encounter (HOSPITAL_COMMUNITY): Payer: Self-pay | Admitting: Vascular Surgery

## 2022-07-12 DIAGNOSIS — D649 Anemia, unspecified: Secondary | ICD-10-CM | POA: Diagnosis not present

## 2022-07-12 DIAGNOSIS — R7881 Bacteremia: Secondary | ICD-10-CM

## 2022-07-12 LAB — CBC
HCT: 18.5 % — ABNORMAL LOW (ref 36.0–46.0)
Hemoglobin: 5.9 g/dL — CL (ref 12.0–15.0)
MCH: 28.1 pg (ref 26.0–34.0)
MCHC: 31.9 g/dL (ref 30.0–36.0)
MCV: 88.1 fL (ref 80.0–100.0)
Platelets: 160 10*3/uL (ref 150–400)
RBC: 2.1 MIL/uL — ABNORMAL LOW (ref 3.87–5.11)
RDW: 18.7 % — ABNORMAL HIGH (ref 11.5–15.5)
WBC: 11.8 10*3/uL — ABNORMAL HIGH (ref 4.0–10.5)
nRBC: 0 % (ref 0.0–0.2)

## 2022-07-12 LAB — HEMOGLOBIN AND HEMATOCRIT, BLOOD
HCT: 23.7 % — ABNORMAL LOW (ref 36.0–46.0)
Hemoglobin: 7.8 g/dL — ABNORMAL LOW (ref 12.0–15.0)

## 2022-07-12 LAB — BASIC METABOLIC PANEL
Anion gap: 13 (ref 5–15)
BUN: 30 mg/dL — ABNORMAL HIGH (ref 6–20)
CO2: 29 mmol/L (ref 22–32)
Calcium: 9.1 mg/dL (ref 8.9–10.3)
Chloride: 93 mmol/L — ABNORMAL LOW (ref 98–111)
Creatinine, Ser: 5.32 mg/dL — ABNORMAL HIGH (ref 0.44–1.00)
GFR, Estimated: 10 mL/min — ABNORMAL LOW (ref >60)
Glucose, Bld: 130 mg/dL — ABNORMAL HIGH (ref 70–99)
Potassium: 3.3 mmol/L — ABNORMAL LOW (ref 3.5–5.1)
Sodium: 135 mmol/L (ref 135–145)

## 2022-07-12 LAB — GLUCOSE, CAPILLARY
Glucose-Capillary: 138 mg/dL — ABNORMAL HIGH (ref 70–99)
Glucose-Capillary: 117 mg/dL — ABNORMAL HIGH (ref 70–99)
Glucose-Capillary: 115 mg/dL — ABNORMAL HIGH (ref 70–99)

## 2022-07-12 LAB — MAGNESIUM: Magnesium: 1.7 mg/dL (ref 1.7–2.4)

## 2022-07-12 LAB — PHOSPHORUS: Phosphorus: 3.6 mg/dL (ref 2.5–4.6)

## 2022-07-12 LAB — PREPARE RBC (CROSSMATCH)

## 2022-07-12 MED ORDER — ONDANSETRON HCL 4 MG/2ML IJ SOLN
4.0000 mg | Freq: Four times a day (QID) | INTRAMUSCULAR | Status: DC | PRN
  Administered 2022-07-12 – 2022-07-17 (×4): 4 mg via INTRAVENOUS
  Filled 2022-07-12 (×4): qty 2

## 2022-07-12 MED ORDER — VANCOMYCIN HCL IN DEXTROSE 1-5 GM/200ML-% IV SOLN
1000.0000 mg | INTRAVENOUS | Status: DC
Start: 2022-07-13 — End: 2022-07-18
  Administered 2022-07-13: 1000 mg via INTRAVENOUS
  Filled 2022-07-12 (×5): qty 200

## 2022-07-12 MED ORDER — SODIUM CHLORIDE 0.9% IV SOLUTION: Freq: Once | INTRAVENOUS | Status: DC

## 2022-07-12 MED ORDER — SODIUM CHLORIDE 0.9% FLUSH: 10.0000 mL | INTRAVENOUS | Status: DC | PRN

## 2022-07-12 MED ORDER — SODIUM CHLORIDE 0.9% FLUSH
10.0000 mL | Freq: Two times a day (BID) | INTRAVENOUS | Status: DC
  Administered 2022-07-12 – 2022-07-19 (×14): 10 mL

## 2022-07-12 MED ORDER — HYDROMORPHONE HCL 1 MG/ML IJ SOLN
0.5000 mg | INTRAMUSCULAR | Status: DC | PRN
  Administered 2022-07-12 – 2022-07-19 (×22): 0.5 mg via INTRAVENOUS
  Filled 2022-07-12 (×22): qty 0.5

## 2022-07-12 MED ORDER — OXYCODONE-ACETAMINOPHEN 5-325 MG PO TABS
1.0000 | ORAL_TABLET | Freq: Four times a day (QID) | ORAL | Status: DC | PRN
  Administered 2022-07-12 – 2022-07-18 (×9): 1 via ORAL
  Filled 2022-07-12 (×9): qty 1

## 2022-07-12 MED ORDER — POTASSIUM CHLORIDE CRYS ER 20 MEQ PO TBCR
40.0000 meq | EXTENDED_RELEASE_TABLET | Freq: Once | ORAL | Status: AC
  Administered 2022-07-12: 40 meq via ORAL
  Filled 2022-07-12: qty 2

## 2022-07-12 NOTE — Progress Notes (Signed)
PROGRESS NOTE    Rebekah Burton  JYN:[id number] DOB: November 01, 1976 DOA: 07/07/2022 PCP: [name], NP     Brief Narrative:  Rebekah Burton is a 46 y.o. female with medical history significant of hypertension, ESRD on HD (MWF), PAD, blindness, bilateral transmetatarsal amputation and tobacco abuse who presents to the emergency department due to lower abdominal/pelvic pain which started on Tuesday afternoon (7/11).  Patient states that after dialysis on Monday, she started to have a slow bleed from the fistula site of left arm, on the following day (Tuesday), she developed an abdominal pain which was of moderate intensity and worsens with movement, there was no known alleviating factor.  She saw her PCP who started her on an antibiotic for UTI, but she receives her medications via mail, so she has not received the medication yet.  She states that she missed dialysis on Wednesday due to severity of abdominal pain.   Work-up in the hospital thus far has revealed symptomatic anemia with acute blood loss anemia from fistula ulcer, sepsis secondary to possible left-sided pyelonephritis and MRSA bacteremia.   Patient underwent excision of infected left arm AV fistula and stent graft material with Dr. Stanford Breed on 7/17.  Patient suffered intraoperative cardiac arrest after placement of percutaneous temporary catheter.  ROSC achieved.  Patient completed procedure in the OR then was extubated in PACU.  She was monitored in the ICU on 7/17.  New events last 24 hours / Subjective: Patient seen in ICU, doing well overall.  Has some pain in her left arm.  Getting blood transfusion today.  Afebrile overnight.  Assessment & Plan:   Principal Problem:   Symptomatic anemia Active Problems:   ESRD (end stage renal disease) (HCC)   Essential hypertension   Acute blood loss anemia   Acquired abnormality of superior vena cava   Hypokalemia   Tobacco use disorder   Sepsis ([place])   Acute pyelonephritis    Hypoalbuminemia due to protein-calorie malnutrition (HCC)   MRSA bacteremia    Symptomatic anemia, acute blood loss anemia from AV fistula ulcer, in setting of anemia of chronic disease -Status post excision of infected left arm AV fistula and stent graft 7/17 -Status post 2 unit packed red blood cell transfusion 7/14 -Transfuse another 2 unit of blood today -Continue to monitor CBC  Sepsis secondary to possible left sided pyelonephritis and MRSA bacteremia -Sepsis present on admission -Blood culture positive for MRSA.  Remains on vancomycin -Tissue abscess from AV fistula abscess showing Staph aureus -Repeat blood cultures obtained 7/16 pending -Infectious disease following -TEE scheduled for 7/19  PEA arrest -Intraoperative on 7/17, related to bradycardia, could be due to adverse effect phenylephrine.  Was extubated in PACU and monitored in the ICU -Stable now, can move to progressive bed   ESRD on HD (MWF) -Per nephrology   Acquired abnormality of superior vena cava -Was on Eliquis, now off anticoagulation secondary to above blood loss  Prolonged QT interval -QTc 502 ms -Avoid QT prolonging drugs   Pancreatic cyst -CT abdomen and pelvis showed cystic lesion of the tail of the pancreas measuring 17 mm -Nonemergent contrast-enhanced MRCP was recommended   Essential hypertension -Hold coreg   GERD -Protonix   Tobacco use disorder -Patient counseled on tobacco abuse cessation   Coronary calcification -Recent CTA chest reviewed with findings of 4 vessel coronary calcification -Ordered and reviewed 2d echo, normal LVEF with no WMA       DVT prophylaxis:  SCDs Start: 07/08/22 0417  Code Status: Full  code Family Communication: No family at bedside Disposition Plan:  Status is: Inpatient Remains inpatient appropriate because: Blood transfusion today.  Remains on IV vancomycin.  TEE 7/19   Antimicrobials:  Anti-infectives (From admission, onward)    Start      Dose/Rate Route Frequency Ordered Stop   07/13/22 1200  vancomycin (VANCOCIN) IVPB 1000 mg/200 mL premix        1,000 mg 200 mL/hr over 60 Minutes Intravenous Every M-W-F (Hemodialysis) 07/12/22 0757     07/11/22 1258  vancomycin variable dose per unstable renal function (pharmacist dosing)  Status:  Discontinued         Does not apply See admin instructions 07/11/22 1258 07/12/22 0757   07/11/22 1200  vancomycin (VANCOREADY) IVPB 750 mg/150 mL  Status:  Discontinued        750 mg 150 mL/hr over 60 Minutes Intravenous Every M-W-F (Hemodialysis) 07/10/22 1246 07/11/22 1252   07/11/22 0745  vancomycin (VANCOCIN) IVPB 1000 mg/200 mL premix       Note to Pharmacy: On call to OR for 07:30 start   1,000 mg 200 mL/hr over 60 Minutes Intravenous  Once 07/11/22 0736 07/11/22 0757   07/11/22 0734  vancomycin (VANCOCIN) 1-5 GM/200ML-% IVPB       Note to Pharmacy: Maude Leriche: cabinet override      07/11/22 0734 07/11/22 0806   07/11/22 0730  vancomycin (VANCOCIN) 1,000 mg in sodium chloride 0.9 % 250 mL IVPB  Status:  Discontinued       Note to Pharmacy: On call to OR for 07:30 start   1,000 mg 250 mL/hr over 60 Minutes Intravenous  Once 07/11/22 0728 07/11/22 0733   07/10/22 1345  vancomycin (VANCOREADY) IVPB 750 mg/150 mL        750 mg 150 mL/hr over 60 Minutes Intravenous  Once 07/10/22 1246 07/10/22 1453   07/09/22 0915  vancomycin (VANCOREADY) IVPB 1500 mg/300 mL        1,500 mg 150 mL/hr over 120 Minutes Intravenous  Once 07/09/22 0820 07/09/22 1146   07/08/22 0600  cefTRIAXone (ROCEPHIN) 1 g in sodium chloride 0.9 % 100 mL IVPB  Status:  Discontinued        1 g 200 mL/hr over 30 Minutes Intravenous Every 24 hours 07/08/22 0512 07/10/22 1557        Objective: Vitals:   07/12/22 1115 07/12/22 1130 07/12/22 1148 07/12/22 1200  BP:    (!) 157/119  Pulse: 80 81 84 85  Resp: 15 16 13 16   Temp:   97.7 F (36.5 C)   TempSrc:   Axillary   SpO2: 100% 100% 100% 100%  Weight:       Height:        Intake/Output Summary (Last 24 hours) at 07/12/2022 1230 Last data filed at 07/12/2022 1005 Gross per 24 hour  Intake 1290 ml  Output 3000 ml  Net -1710 ml   Filed Weights   07/09/22 2318 07/11/22 1258 07/11/22 1717  Weight: 99.5 kg 105.3 kg 102.3 kg    Examination:  General exam: Appears calm and comfortable  Respiratory system: Clear to auscultation. Respiratory effort normal. No respiratory distress. No conversational dyspnea.  Cardiovascular system: S1 & S2 heard, RRR. No murmurs. No pedal edema. Gastrointestinal system: Abdomen is nondistended, soft  Central nervous system: Alert and oriented. No focal neurological deficits. Speech clear.  Psychiatry: Judgement and insight appear normal. Mood & affect appropriate.   Data Reviewed: I have personally reviewed following labs and imaging studies  CBC: Recent Labs  Lab 07/07/22 1215 07/07/22 2320 07/08/22 0936 07/09/22 0521 07/10/22 0221 07/10/22 1025 07/11/22 0329 07/11/22 0920 07/12/22 0317 07/12/22 1144  WBC 6.7 8.4   < > 10.2 10.8* 10.5 10.5  --  11.8*  --   NEUTROABS 5.9 7.6  --   --   --   --   --   --   --   --   HGB 6.0* 6.7*   < > 7.5* 7.1* 7.4* 7.0* 6.8* 5.9* 7.8*  HCT 19.5* 21.4*   < > 23.6* 22.0* 23.0* 20.9* 20.0* 18.5* 23.7*  MCV 91.1 90.7   < > 90.1 87.6 87.5 86.7  --  88.1  --   PLT 103* 125*   < > 95* 106* 96* 126*  --  160  --    < > = values in this interval not displayed.   Basic Metabolic Panel: Recent Labs  Lab 07/07/22 2320 07/08/22 0936 07/09/22 0521 07/10/22 0221 07/10/22 1025 07/11/22 0329 07/11/22 0920 07/11/22 1145 07/12/22 0317  NA 138 136   < > 133* 136 136 132* 135 135  K 3.7 3.9   < > 3.5 3.7 3.6 3.5 3.8 3.3*  CL 97* 96*   < > 94* 95* 94*  --  91* 93*  CO2 27 27   < > 24 26 25   --  24 29  GLUCOSE 123* 136*   < > 131* 129* 123*  --  188* 130*  BUN 37* 44*   < > 33* 37* 45*  --  51* 30*  CREATININE 7.34* 8.20*   < > 6.37* 6.86* 7.96*  --  8.39* 5.32*  CALCIUM  9.1 8.9   < > 9.2 9.6 9.7  --  9.8 9.1  MG 2.1 1.9  --   --   --   --   --  1.8 1.7  PHOS  --  5.2*  --   --  4.2 4.0  --   --  3.6   < > = values in this interval not displayed.   GFR: Estimated Creatinine Clearance: 15.9 mL/min (A) (by C-G formula based on SCr of 5.32 mg/dL (H)). Liver Function Tests: Recent Labs  Lab 07/07/22 1108 07/08/22 0936 07/09/22 0521 07/10/22 0221 07/10/22 1025 07/11/22 0329  AST 24 17 17 24   --   --   ALT 21 18 18 20   --   --   ALKPHOS 59 55 62 76  --   --   BILITOT 1.2 1.4* 2.7* 3.6*  --   --   PROT 6.1* 5.9* 6.2* 6.1*  --   --   ALBUMIN 3.0* 2.8* 2.9* 2.8* 2.8* 2.8*   Recent Labs  Lab 07/07/22 1108  LIPASE 18   No results for input(s): "AMMONIA" in the last 168 hours. Coagulation Profile: No results for input(s): "INR", "PROTIME" in the last 168 hours. Cardiac Enzymes: No results for input(s): "CKTOTAL", "CKMB", "CKMBINDEX", "TROPONINI" in the last 168 hours. BNP (last 3 results) No results for input(s): "PROBNP" in the last 8760 hours. HbA1C: No results for input(s): "HGBA1C" in the last 72 hours. CBG: Recent Labs  Lab 07/11/22 1011 07/11/22 1520 07/11/22 2306 07/12/22 0751 07/12/22 1153  GLUCAP 144* 160* 122* 138* 117*   Lipid Profile: No results for input(s): "CHOL", "HDL", "LDLCALC", "TRIG", "CHOLHDL", "LDLDIRECT" in the last 72 hours. Thyroid Function Tests: No results for input(s): "TSH", "T4TOTAL", "FREET4", "T3FREE", "THYROIDAB" in the last 72 hours. Anemia Panel: No results  for input(s): "VITAMINB12", "FOLATE", "FERRITIN", "TIBC", "IRON", "RETICCTPCT" in the last 72 hours. Sepsis Labs: No results for input(s): "PROCALCITON", "LATICACIDVEN" in the last 168 hours.  Recent Results (from the past 240 hour(s))  Culture, blood (Routine X 2) w Reflex to ID Panel     Status: Abnormal   Collection Time: 07/08/22  9:36 AM   Specimen: BLOOD  Result Value Ref Range Status   Specimen Description BLOOD RIGHT ANTECUBITAL  Final    Special Requests   Final    BOTTLES DRAWN AEROBIC AND ANAEROBIC Blood Culture results may not be optimal due to an excessive volume of blood received in culture bottles   Culture  Setup Time   Final    GRAM POSITIVE COCCI Gram Stain Report Called to,Read Back By and Verified With: MUSE,J ON 07/08/22 AT 2330 BY LOY,C AEROBIC AND ANAEROBIC BOTTLES PERFORMED AT [facility] CRITICAL RESULT CALLED TO, READ BACK BY AND VERIFIED WITH: J MUSE,RN@0623  07/09/22 [place] Performed at [facility], [place] [address] [place], [place] [address]    Culture METHICILLIN RESISTANT STAPHYLOCOCCUS AUREUS (A)  Final   Report Status 07/11/2022 FINAL  Final   Organism ID, Bacteria METHICILLIN RESISTANT STAPHYLOCOCCUS AUREUS  Final      Susceptibility   Methicillin resistant staphylococcus aureus - MIC*    CIPROFLOXACIN <=0.5 SENSITIVE Sensitive     ERYTHROMYCIN >=8 RESISTANT Resistant     GENTAMICIN <=0.5 SENSITIVE Sensitive     OXACILLIN >=4 RESISTANT Resistant     TETRACYCLINE <=1 SENSITIVE Sensitive     VANCOMYCIN 1 SENSITIVE Sensitive     TRIMETH/SULFA <=10 SENSITIVE Sensitive     CLINDAMYCIN <=0.25 SENSITIVE Sensitive     RIFAMPIN <=0.5 SENSITIVE Sensitive     Inducible Clindamycin NEGATIVE Sensitive     * METHICILLIN RESISTANT STAPHYLOCOCCUS AUREUS  Culture, blood (Routine X 2) w Reflex to ID Panel     Status: Abnormal   Collection Time: 07/08/22  9:36 AM   Specimen: BLOOD RIGHT HAND  Result Value Ref Range Status   Specimen Description   Final    BLOOD RIGHT HAND BOTTLES DRAWN AEROBIC AND ANAEROBIC Performed at [facility], [address] [place], [place] [address]    Special Requests   Final    Blood Culture adequate volume Performed at [facility], [address] [place], [place] [address]    Culture  Setup Time   Final    GRAM POSITIVE COCCI Gram Stain Report Called to,Read Back By and Verified With: MUSE, J ON 07/08/22 AT 2330 BY LOY,C AEROBIC AND ANAEROBIC BOTTLES PERFORMED AT [facility] Performed at  [facility], [address] [place], [place] [address]    Culture (A)  Final    STAPHYLOCOCCUS AUREUS SUSCEPTIBILITIES PERFORMED ON PREVIOUS CULTURE WITHIN THE LAST 5 DAYS. Performed at [facility], [place] [address], [place], [place] [address]    Report Status 07/11/2022 FINAL  Final  Blood Culture ID Panel (Reflexed)     Status: Abnormal   Collection Time: 07/08/22  9:36 AM  Result Value Ref Range Status   Enterococcus faecalis NOT DETECTED NOT DETECTED Final   Enterococcus Faecium NOT DETECTED NOT DETECTED Final   Listeria monocytogenes NOT DETECTED NOT DETECTED Final   Staphylococcus species DETECTED (A) NOT DETECTED Final    Comment: CRITICAL RESULT CALLED TO, READ BACK BY AND VERIFIED WITH: J MUSE,RN@0623  07/09/22 [place]    Staphylococcus aureus (BCID) DETECTED (A) NOT DETECTED Final    Comment: Methicillin (oxacillin)-resistant Staphylococcus aureus (MRSA). MRSA  is predictably resistant to beta-lactam antibiotics (except ceftaroline). Preferred therapy is vancomycin unless clinically contraindicated. Patient requires contact precautions if  hospitalized. CRITICAL RESULT CALLED TO, READ BACK BY AND VERIFIED WITH: J MUSE,RN@0623  07/09/22 [place]    Staphylococcus epidermidis NOT DETECTED NOT DETECTED Final   Staphylococcus lugdunensis NOT DETECTED NOT DETECTED Final   Streptococcus species NOT DETECTED NOT DETECTED Final   Streptococcus agalactiae NOT DETECTED NOT DETECTED Final   Streptococcus pneumoniae NOT DETECTED NOT DETECTED Final   Streptococcus pyogenes NOT DETECTED NOT DETECTED Final   A.calcoaceticus-baumannii NOT DETECTED NOT DETECTED Final   Bacteroides fragilis NOT DETECTED NOT DETECTED Final   Enterobacterales NOT DETECTED NOT DETECTED Final   Enterobacter cloacae complex NOT DETECTED NOT DETECTED Final   Escherichia coli NOT DETECTED NOT DETECTED Final   Klebsiella aerogenes NOT DETECTED NOT DETECTED Final   Klebsiella oxytoca NOT DETECTED NOT DETECTED Final    Klebsiella pneumoniae NOT DETECTED NOT DETECTED Final   Proteus species NOT DETECTED NOT DETECTED Final   Salmonella species NOT DETECTED NOT DETECTED Final   Serratia marcescens NOT DETECTED NOT DETECTED Final   Haemophilus influenzae NOT DETECTED NOT DETECTED Final   Neisseria meningitidis NOT DETECTED NOT DETECTED Final   Pseudomonas aeruginosa NOT DETECTED NOT DETECTED Final   Stenotrophomonas maltophilia NOT DETECTED NOT DETECTED Final   Candida albicans NOT DETECTED NOT DETECTED Final   Candida auris NOT DETECTED NOT DETECTED Final   Candida glabrata NOT DETECTED NOT DETECTED Final   Candida krusei NOT DETECTED NOT DETECTED Final   Candida parapsilosis NOT DETECTED NOT DETECTED Final   Candida tropicalis NOT DETECTED NOT DETECTED Final   Cryptococcus neoformans/gattii NOT DETECTED NOT DETECTED Final   Meth resistant mecA/C and MREJ DETECTED (A) NOT DETECTED Final    Comment: CRITICAL RESULT CALLED TO, READ BACK BY AND VERIFIED WITH: J MUSE,RN@0623  07/09/22 [place] Performed at [facility], [address], [place], [place] [address]   Culture, blood (Routine X 2) w Reflex to ID Panel     Status: None (Preliminary result)   Collection Time: 07/10/22  4:36 PM   Specimen: BLOOD LEFT ARM  Result Value Ref Range Status   Specimen Description BLOOD LEFT ARM  Final   Special Requests   Final    BOTTLES DRAWN AEROBIC AND ANAEROBIC Blood Culture results may not be optimal due to an inadequate volume of blood received in culture bottles   Culture   Final    NO GROWTH < 24 HOURS Performed at [facility], [place] [address], [place], [place] [address]    Report Status PENDING  Incomplete  Culture, blood (Routine X 2) w Reflex to ID Panel     Status: None (Preliminary result)   Collection Time: 07/10/22  4:36 PM   Specimen: BLOOD RIGHT WRIST  Result Value Ref Range Status   Specimen Description BLOOD RIGHT WRIST  Final   Special Requests   Final    BOTTLES DRAWN AEROBIC AND  ANAEROBIC Blood Culture adequate volume   Culture   Final    NO GROWTH < 24 HOURS Performed at [facility], [place] [address], [place], [place] [address]    Report Status PENDING  Incomplete  Surgical pcr screen     Status: Abnormal   Collection Time: 07/11/22  6:06 AM   Specimen: Nasal Mucosa; Nasal Swab  Result Value Ref Range Status   MRSA, PCR POSITIVE (A) NEGATIVE Final   Staphylococcus aureus POSITIVE (A) NEGATIVE Final  Comment: RESULT CALLED TO, READ BACK BY AND VERIFIED WITH: RN SANTOS.A AT 9480 ON 07/11/2022 BY T.SAAD. (NOTE) The Xpert SA Assay (FDA approved for NASAL specimens in patients 52 years of age and older), is one component of a comprehensive surveillance program. It is not intended to diagnose infection nor to guide or monitor treatment. Performed at [facility], [place] [address] [place], [place] [address]   Aerobic/Anaerobic Culture w Gram Stain (surgical/deep wound)     Status: None (Preliminary result)   Collection Time: 07/11/22  8:46 AM   Specimen: PATH Other; Tissue  Result Value Ref Range Status   Specimen Description TISSUE ABSCESS  Final   Special Requests LEFT ARTERIOVENOUS FISTULA ABSC  Final   Gram Stain   Final    FEW WBC PRESENT,BOTH PMN AND MONONUCLEAR NO ORGANISMS SEEN    Culture   Final    FEW STAPHYLOCOCCUS AUREUS CULTURE REINCUBATED FOR BETTER GROWTH SUSCEPTIBILITIES TO FOLLOW Performed at [facility], [place] [address] [place], [place] [address]    Report Status PENDING  Incomplete  Aerobic/Anaerobic Culture w Gram Stain (surgical/deep wound)     Status: None (Preliminary result)   Collection Time: 07/11/22  8:46 AM   Specimen: PATH Other; Tissue  Result Value Ref Range Status   Specimen Description ABSCESS  Final   Special Requests LEFT ARTERIOVENOUS FISTULA ABSC  Final   Gram Stain   Final    FEW WBC PRESENT,BOTH PMN AND MONONUCLEAR RARE GRAM POSITIVE COCCI    Culture   Final    FEW STAPHYLOCOCCUS  AUREUS SUSCEPTIBILITIES TO FOLLOW Performed at [facility], [place] [address], [place], [place] [address]    Report Status PENDING  Incomplete      Radiology Studies: No results found.    Scheduled Meds:  calcitRIOL  2.5 mcg Oral Q M,W,F-HD   Chlorhexidine Gluconate Cloth  6 each Topical Q0600   Chlorhexidine Gluconate Cloth  6 each Topical Q0600   Chlorhexidine Gluconate Cloth  6 each Topical Q0600   cinacalcet  90 mg Oral Q M,W,F-HD   lanthanum  1,000 mg Oral TID WC   multivitamin  1 tablet Oral QHS   mupirocin ointment  1 Application Nasal BID   pantoprazole  40 mg Oral Daily   polyethylene glycol  17 g Oral Daily   sodium chloride flush  10-40 mL Intracatheter Q12H   Continuous Infusions:  [START ON 07/13/2022] vancomycin       LOS: 4 days     Dessa Phi, DO [facility] 07/12/2022, 12:30 PM   Available via Epic secure chat 7am-7pm After these hours, please refer to coverage provider listed on [email or web address]

## 2022-07-12 NOTE — Progress Notes (Addendum)
  Progress Note    07/12/2022 8:55 AM 1 Day Post-Op  Subjective: pain in left upper arm. No pain in left hand, no numbness or weakness   Vitals:   07/12/22 0815 07/12/22 0830  BP:    Pulse: 81 82  Resp: 11 12  Temp:    SpO2: 91% 91%   Physical Exam: Cardiac:  regular Lungs:  non labored Incisions:  left arm dressings c/d/I Extremities:  2+ left radial pulse. Left hand warm. 5/5 grip strength Neurologic: alert and oriented  CBC    Component Value Date/Time   WBC 11.8 (H) 07/12/2022 0317   RBC 2.10 (L) 07/12/2022 0317   HGB 5.9 (LL) 07/12/2022 0317   HCT 18.5 (L) 07/12/2022 0317   PLT 160 07/12/2022 0317   MCV 88.1 07/12/2022 0317   MCH 28.1 07/12/2022 0317   MCHC 31.9 07/12/2022 0317   RDW 18.7 (H) 07/12/2022 0317   LYMPHSABS 0.2 (L) 07/07/2022 2320   MONOABS 0.4 07/07/2022 2320   EOSABS 0.1 07/07/2022 2320   BASOSABS 0.0 07/07/2022 2320    BMET    Component Value Date/Time   NA 135 07/12/2022 0317   K 3.3 (L) 07/12/2022 0317   CL 93 (L) 07/12/2022 0317   CO2 29 07/12/2022 0317   GLUCOSE 130 (H) 07/12/2022 0317   BUN 30 (H) 07/12/2022 0317   CREATININE 5.32 (H) 07/12/2022 0317   CALCIUM 9.1 07/12/2022 0317   GFRNONAA 10 (L) 07/12/2022 0317   GFRAA 5 (L) 02/13/2019 0734    INR No results found for: "INR"   Intake/Output Summary (Last 24 hours) at 07/12/2022 0855 Last data filed at 07/12/2022 0800 Gross per 24 hour  Intake 1675 ml  Output 3210 ml  Net -1535 ml     Assessment/Plan:  45 y.o. female is s/p 1) placement of right common femoral vein percutaneous dialysis catheter. 2) excision of infected left arm arteriovenous fistula and stent graft material 1 Day Post-Op   Pain in left upper arm. Will adjust pain regimen Left Arm staples intact. Dressings were changed by RN this morning due to some oozing from proximal staple line Right femoral catheter intact without bleeding. Functioning well for HD yesterday Hgb 5.9 this morning. Getting 2nd  unit of PRBC Cultures pending On Vanc  Marval Regal [facility] [phone] 07/12/2022 8:55 AM  VASCULAR STAFF ADDENDUM: I have independently interviewed and examined the patient. I agree with the above.  Reviewed operative events including arrest with patient. Thankfully she is doing well and appears to be at neurologic baseline.  She should use the temporary femoral catheter until bacteremia clears. Plan TDC later this week assuming blood cultures are negative. Will check CT venogram of chest to see if [facility] is feasible from a jugular approach  [name], MD [facility] Phone Number: [phone] 07/12/2022 2:52 PM

## 2022-07-12 NOTE — Progress Notes (Signed)
[facility] received request for TEE on this patient, originally tentatively scheduled for tomorrow - chart reviewed by team; noted to have significant anemia today requiring blood transfusion, therefore it is felt that this should be deferred until anemia is demonstrated to be stable. [facility] will f/u status in AM and reschedule for later this week. The team will circle back to do consent and orders once plans are finalized.

## 2022-07-12 NOTE — Progress Notes (Signed)
eLink Physician-Brief Progress Note Patient Name: Rebekah Burton DOB: 1976/02/03 MRN: [id number]   Date of Service  07/12/2022  HPI/Events of Note  K+ 3.3, GFR 10.  eICU Interventions  KCL 40 meq PO x 1 ordered.        [name] 07/12/2022, 5:33 AM

## 2022-07-12 NOTE — Progress Notes (Signed)
Pt transported to 3E16. All belongings along with phone and phone chargers were sent with patient. Report given to Rodman Key, RN.

## 2022-07-12 NOTE — Progress Notes (Signed)
[facility] for [facility]ious Disease   Reason for visit: Follow up on MRSA bacteremia  Interval History: remains afebrile, WBC 11.8, Staph aureus growing on fistula culture.    Physical Exam: Constitutional:  Vitals:   07/12/22 1148 07/12/22 1200  BP:  (!) 157/119  Pulse: 84 85  Resp: 13 16  Temp: 97.7 F (36.5 C)   SpO2: 100% 100%   patient appears in NAD Respiratory: Normal respiratory effort; CTA B Cardiovascular: RRR  Review of Systems: Constitutional: negative for fevers and chills  Lab Results  Component Value Date   WBC 11.8 (H) 07/12/2022   HGB 7.8 (L) 07/12/2022   HCT 23.7 (L) 07/12/2022   MCV 88.1 07/12/2022   PLT 160 07/12/2022    Lab Results  Component Value Date   CREATININE 5.32 (H) 07/12/2022   BUN 30 (H) 07/12/2022   NA 135 07/12/2022   K 3.3 (L) 07/12/2022   CL 93 (L) 07/12/2022   CO2 29 07/12/2022    Lab Results  Component Value Date   ALT 20 07/10/2022   AST 24 07/10/2022   ALKPHOS 76 07/10/2022     Microbiology: Recent Results (from the past 240 hour(s))  Culture, blood (Routine X 2) w Reflex to ID Panel     Status: Abnormal   Collection Time: 07/08/22  9:36 AM   Specimen: BLOOD  Result Value Ref Range Status   Specimen Description BLOOD RIGHT ANTECUBITAL  Final   Special Requests   Final    BOTTLES DRAWN AEROBIC AND ANAEROBIC Blood Culture results may not be optimal due to an excessive volume of blood received in culture bottles   Culture  Setup Time   Final    GRAM POSITIVE COCCI Gram Stain Report Called to,Read Back By and Verified With: MUSE,J ON 07/08/22 AT 2330 BY LOY,C AEROBIC AND ANAEROBIC BOTTLES PERFORMED AT [facility] CRITICAL RESULT CALLED TO, READ BACK BY AND VERIFIED WITH: J MUSE,RN@0623  07/09/22 [place] Performed at [facility], [place] [address] [place], [place] [address]    Culture METHICILLIN RESISTANT STAPHYLOCOCCUS AUREUS (A)  Final   Report Status 07/11/2022 FINAL  Final   Organism ID, Bacteria METHICILLIN  RESISTANT STAPHYLOCOCCUS AUREUS  Final      Susceptibility   Methicillin resistant staphylococcus aureus - MIC*    CIPROFLOXACIN <=0.5 SENSITIVE Sensitive     ERYTHROMYCIN >=8 RESISTANT Resistant     GENTAMICIN <=0.5 SENSITIVE Sensitive     OXACILLIN >=4 RESISTANT Resistant     TETRACYCLINE <=1 SENSITIVE Sensitive     VANCOMYCIN 1 SENSITIVE Sensitive     TRIMETH/SULFA <=10 SENSITIVE Sensitive     CLINDAMYCIN <=0.25 SENSITIVE Sensitive     RIFAMPIN <=0.5 SENSITIVE Sensitive     Inducible Clindamycin NEGATIVE Sensitive     * METHICILLIN RESISTANT STAPHYLOCOCCUS AUREUS  Culture, blood (Routine X 2) w Reflex to ID Panel     Status: Abnormal   Collection Time: 07/08/22  9:36 AM   Specimen: BLOOD RIGHT HAND  Result Value Ref Range Status   Specimen Description   Final    BLOOD RIGHT HAND BOTTLES DRAWN AEROBIC AND ANAEROBIC Performed at [facility], [address], [place], [place] [address]    Special Requests   Final    Blood Culture adequate volume Performed at [facility], [address] [place], [place] [address]    Culture  Setup Time   Final    GRAM POSITIVE COCCI Gram Stain Report Called to,Read Back By and Verified With:  MUSE, J ON 07/08/22 AT 2330 BY LOY,C AEROBIC AND ANAEROBIC BOTTLES PERFORMED AT [facility] Performed at [facility], [address], [place], [place] [address]    Culture (A)  Final    STAPHYLOCOCCUS AUREUS SUSCEPTIBILITIES PERFORMED ON PREVIOUS CULTURE WITHIN THE LAST 5 DAYS. Performed at [facility], [place] [address] [place], [place] [address]    Report Status 07/11/2022 FINAL  Final  Blood Culture ID Panel (Reflexed)     Status: Abnormal   Collection Time: 07/08/22  9:36 AM  Result Value Ref Range Status   Enterococcus faecalis NOT DETECTED NOT DETECTED Final   Enterococcus Faecium NOT DETECTED NOT DETECTED Final   Listeria monocytogenes NOT DETECTED NOT DETECTED Final   Staphylococcus species DETECTED (A) NOT DETECTED Final    Comment: CRITICAL  RESULT CALLED TO, READ BACK BY AND VERIFIED WITH: J MUSE,RN@0623  07/09/22 [place]    Staphylococcus aureus (BCID) DETECTED (A) NOT DETECTED Final    Comment: Methicillin (oxacillin)-resistant Staphylococcus aureus (MRSA). MRSA is predictably resistant to beta-lactam antibiotics (except ceftaroline). Preferred therapy is vancomycin unless clinically contraindicated. Patient requires contact precautions if  hospitalized. CRITICAL RESULT CALLED TO, READ BACK BY AND VERIFIED WITH: J MUSE,RN@0623  07/09/22 [place]    Staphylococcus epidermidis NOT DETECTED NOT DETECTED Final   Staphylococcus lugdunensis NOT DETECTED NOT DETECTED Final   Streptococcus species NOT DETECTED NOT DETECTED Final   Streptococcus agalactiae NOT DETECTED NOT DETECTED Final   Streptococcus pneumoniae NOT DETECTED NOT DETECTED Final   Streptococcus pyogenes NOT DETECTED NOT DETECTED Final   A.calcoaceticus-baumannii NOT DETECTED NOT DETECTED Final   Bacteroides fragilis NOT DETECTED NOT DETECTED Final   Enterobacterales NOT DETECTED NOT DETECTED Final   Enterobacter cloacae complex NOT DETECTED NOT DETECTED Final   Escherichia coli NOT DETECTED NOT DETECTED Final   Klebsiella aerogenes NOT DETECTED NOT DETECTED Final   Klebsiella oxytoca NOT DETECTED NOT DETECTED Final   Klebsiella pneumoniae NOT DETECTED NOT DETECTED Final   Proteus species NOT DETECTED NOT DETECTED Final   Salmonella species NOT DETECTED NOT DETECTED Final   Serratia marcescens NOT DETECTED NOT DETECTED Final   Haemophilus influenzae NOT DETECTED NOT DETECTED Final   Neisseria meningitidis NOT DETECTED NOT DETECTED Final   Pseudomonas aeruginosa NOT DETECTED NOT DETECTED Final   Stenotrophomonas maltophilia NOT DETECTED NOT DETECTED Final   Candida albicans NOT DETECTED NOT DETECTED Final   Candida auris NOT DETECTED NOT DETECTED Final   Candida glabrata NOT DETECTED NOT DETECTED Final   Candida krusei NOT DETECTED NOT DETECTED Final   Candida parapsilosis  NOT DETECTED NOT DETECTED Final   Candida tropicalis NOT DETECTED NOT DETECTED Final   Cryptococcus neoformans/gattii NOT DETECTED NOT DETECTED Final   Meth resistant mecA/C and MREJ DETECTED (A) NOT DETECTED Final    Comment: CRITICAL RESULT CALLED TO, READ BACK BY AND VERIFIED WITH: J MUSE,RN@0623  07/09/22 [place] Performed at [facility], [address], [place], [place] [address]   Culture, blood (Routine X 2) w Reflex to ID Panel     Status: None (Preliminary result)   Collection Time: 07/10/22  4:36 PM   Specimen: BLOOD LEFT ARM  Result Value Ref Range Status   Specimen Description BLOOD LEFT ARM  Final   Special Requests   Final    BOTTLES DRAWN AEROBIC AND ANAEROBIC Blood Culture results may not be optimal due to an inadequate volume of blood received in culture bottles   Culture   Final    NO GROWTH < 24 HOURS  Performed at [facility], [place] [address], [place], [place] [address]    Report Status PENDING  Incomplete  Culture, blood (Routine X 2) w Reflex to ID Panel     Status: None (Preliminary result)   Collection Time: 07/10/22  4:36 PM   Specimen: BLOOD RIGHT WRIST  Result Value Ref Range Status   Specimen Description BLOOD RIGHT WRIST  Final   Special Requests   Final    BOTTLES DRAWN AEROBIC AND ANAEROBIC Blood Culture adequate volume   Culture   Final    NO GROWTH < 24 HOURS Performed at [facility], [place] [address], [place], [place] [address]    Report Status PENDING  Incomplete  Surgical pcr screen     Status: Abnormal   Collection Time: 07/11/22  6:06 AM   Specimen: Nasal Mucosa; Nasal Swab  Result Value Ref Range Status   MRSA, PCR POSITIVE (A) NEGATIVE Final   Staphylococcus aureus POSITIVE (A) NEGATIVE Final    Comment: RESULT CALLED TO, READ BACK BY AND VERIFIED WITH: RN SANTOS.A AT 2902 ON 07/11/2022 BY T.SAAD. (NOTE) The Xpert SA Assay (FDA approved for NASAL specimens in patients 47 years of age and older), is one component of a  comprehensive surveillance program. It is not intended to diagnose infection nor to guide or monitor treatment. Performed at [facility], [place] [address], [place], [place] [address]   Aerobic/Anaerobic Culture w Gram Stain (surgical/deep wound)     Status: None (Preliminary result)   Collection Time: 07/11/22  8:46 AM   Specimen: PATH Other; Tissue  Result Value Ref Range Status   Specimen Description TISSUE ABSCESS  Final   Special Requests LEFT ARTERIOVENOUS FISTULA ABSC  Final   Gram Stain   Final    FEW WBC PRESENT,BOTH PMN AND MONONUCLEAR NO ORGANISMS SEEN    Culture   Final    FEW STAPHYLOCOCCUS AUREUS CULTURE REINCUBATED FOR BETTER GROWTH SUSCEPTIBILITIES TO FOLLOW Performed at [facility], [place] [address], [place], [place] [address]    Report Status PENDING  Incomplete  Aerobic/Anaerobic Culture w Gram Stain (surgical/deep wound)     Status: None (Preliminary result)   Collection Time: 07/11/22  8:46 AM   Specimen: PATH Other; Tissue  Result Value Ref Range Status   Specimen Description ABSCESS  Final   Special Requests LEFT ARTERIOVENOUS FISTULA ABSC  Final   Gram Stain   Final    FEW WBC PRESENT,BOTH PMN AND MONONUCLEAR RARE GRAM POSITIVE COCCI    Culture   Final    FEW STAPHYLOCOCCUS AUREUS SUSCEPTIBILITIES TO FOLLOW Performed at [facility], [place] [address], [place], [place] [address]    Report Status PENDING  Incomplete    Impression/Plan:  1. MRSA bacteremia - infected fistula, no removed and culture also with Staph aureus c/w the fistula as the source.  Repeat blood cultures remain negative.   Will need a TEE Will continue with vancomycin   2.  Access - now with a temporary catheter in place with a plan for a TDC after prolonged clearance of the bacteremia.   3.  ESRD - on hemodialysis and getting antibiotics after dialysis on dialysis days.

## 2022-07-12 NOTE — Progress Notes (Signed)
eLink Physician-Brief Progress Note Patient Name: Rebekah Burton DOB: Jun 01, 1976 MRN: [id number]   Date of Service  07/12/2022  HPI/Events of Note  Hemoglobin 5.9 gm / dl, no overt bleeding currently, patient had an A-V fistula revision in the past 24 hours and did have some oozing from the fistula site at some point.  eICU Interventions  Transfuse 2 units PRBC then trend H & H.        Kerry Kass Kniyah Khun 07/12/2022, 3:54 AM

## 2022-07-12 NOTE — Progress Notes (Signed)
Pharmacy Antibiotic Note  Rebekah Burton is a 46 y.o. female admitted on 07/07/2022 with MRSA bacteremia. Pharmacy has been consulted for Vancomycin dosing. Noted that patient received a 1000 mg dose of Vanc yesterday pre-op which resulted in a pre-HD level of 40, so post-HD dose was held. Patient tolerated 4 hours of HD yesterday.   Plan: Resume vancomycin 1000 mg after next HD 7/19 Continue with Vancomycin 1000 mg with HD every MWF Monitor pre-HD levels, repeat blood cultures, TEE 7/19   Height: 5\' 4"  (162.6 cm) Weight: 102.3 kg (225 lb 8.5 oz) IBW/kg (Calculated) : 54.7  Temp (24hrs), Avg:98 F (36.7 C), Min:97.4 F (36.3 C), Max:98.5 F (36.9 C)  Recent Labs  Lab 07/09/22 0521 07/10/22 0221 07/10/22 1025 07/11/22 0329 07/11/22 1145 07/12/22 0317  WBC 10.2 10.8* 10.5 10.5  --  11.8*  CREATININE 9.15* 6.37* 6.86* 7.96* 8.39* 5.32*  VANCORANDOM  --   --   --   --  40  --      Estimated Creatinine Clearance: 15.9 mL/min (A) (by C-G formula based on SCr of 5.32 mg/dL (H)).    No Known Allergies  Antimicrobials this admission: Vancomycin 7/15 >>       Thank you for allowing pharmacy to be a part of this patient's care.  Jimmy Footman, PharmD, BCPS, BCIDP Infectious Diseases Clinical Pharmacist Phone: [phone] 07/12/2022 7:52 AM

## 2022-07-12 NOTE — Anesthesia Postprocedure Evaluation (Signed)
Anesthesia Post Note  Patient: Rebekah Burton  Procedure(s) Performed: EXCISION OF INFECTED LEFT ARM ARTERIOVENOUS FISTULA (Left) INSERTION OF RIGHT FEMORAL TEMPORARY DIALYSIS CATHETER     Patient location during evaluation: PACU Anesthesia Type: General Level of consciousness: awake and alert Pain management: pain level controlled Vital Signs Assessment: post-procedure vital signs reviewed and stable Respiratory status: spontaneous breathing, nonlabored ventilation and respiratory function stable Cardiovascular status: blood pressure returned to baseline Postop Assessment: no apparent nausea or vomiting Anesthetic complications: no Comments: Pt suffered PEA arrest in the OR.  This event occurred after the placement of the right femoral dialysis catheter.  Bradycardia, hypopnea, hypotension followed by inability to palpate pulses.  CPR and ACLS initiated.  Patient was intubated.  After 2-3 minutes of resuscitation, ROSC was achieved.  Post operatively, critical care was consulted for monitoring.   No notable events documented.  Last Vitals:  Vitals:   07/12/22 0745 07/12/22 0750  BP: (!) 156/117   Pulse: 86 82  Resp: 10 10  Temp: 36.7 C   SpO2: 100% 100%    Last Pain:  Vitals:   07/12/22 0745  TempSrc: Axillary  PainSc:    Pain Goal: Patients Stated Pain Goal: 0 (07/11/22 1659)                 [place]

## 2022-07-12 NOTE — Progress Notes (Signed)
[facility] Progress Note   Subjective:   required blood transfusions overnight. Apparently no issues with HD yesterday, net UF 3L. Patient does not recall having HD done (possibly still affected by sedation from OR). Other then LUE pain, no other complaints  Objective Vitals:   07/12/22 0811 07/12/22 0815 07/12/22 0830 07/12/22 0900  BP:      Pulse: 84 81 82 85  Resp: 14 11 12 12   Temp:      TempSrc:      SpO2: 97% 91% 91% 94%  Weight:      Height:       Physical Exam General: chronically ill but nontoxic appearing, sitting up in bed Heart: RRR Lungs:clear ant Abdomen: soft, obese Extremities:no edema Dialysis Access: lue avf dressing in place, rt fem temp HD catheter  Additional Objective Labs: Basic Metabolic Panel: Recent Labs  Lab 07/10/22 1025 07/11/22 0329 07/11/22 0920 07/11/22 1145 07/12/22 0317  NA 136 136 132* 135 135  K 3.7 3.6 3.5 3.8 3.3*  CL 95* 94*  --  91* 93*  CO2 26 25  --  24 29  GLUCOSE 129* 123*  --  188* 130*  BUN 37* 45*  --  51* 30*  CREATININE 6.86* 7.96*  --  8.39* 5.32*  CALCIUM 9.6 9.7  --  9.8 9.1  PHOS 4.2 4.0  --   --  3.6   Liver Function Tests: Recent Labs  Lab 07/08/22 0936 07/09/22 0521 07/10/22 0221 07/10/22 1025 07/11/22 0329  AST 17 17 24   --   --   ALT 18 18 20   --   --   ALKPHOS 55 62 76  --   --   BILITOT 1.4* 2.7* 3.6*  --   --   PROT 5.9* 6.2* 6.1*  --   --   ALBUMIN 2.8* 2.9* 2.8* 2.8* 2.8*   Recent Labs  Lab 07/07/22 1108  LIPASE 18   CBC: Recent Labs  Lab 07/07/22 1215 07/07/22 2320 07/08/22 0936 07/09/22 0521 07/10/22 0221 07/10/22 1025 07/11/22 0329 07/11/22 0920 07/12/22 0317  WBC 6.7 8.4   < > 10.2 10.8* 10.5 10.5  --  11.8*  NEUTROABS 5.9 7.6  --   --   --   --   --   --   --   HGB 6.0* 6.7*   < > 7.5* 7.1* 7.4* 7.0* 6.8* 5.9*  HCT 19.5* 21.4*   < > 23.6* 22.0* 23.0* 20.9* 20.0* 18.5*  MCV 91.1 90.7   < > 90.1 87.6 87.5 86.7  --  88.1  PLT 103* 125*   < > 95* 106* 96*  126*  --  160   < > = values in this interval not displayed.   Blood Culture    Component Value Date/Time   SDES TISSUE ABSCESS 07/11/2022 0846   SDES ABSCESS 07/11/2022 0846   SPECREQUEST LEFT ARTERIOVENOUS FISTULA ABSC 07/11/2022 0846   SPECREQUEST LEFT ARTERIOVENOUS FISTULA ABSC 07/11/2022 0846   CULT  07/11/2022 0846    CULTURE REINCUBATED FOR BETTER GROWTH Performed at [facility], [place] [address] [place], [place] [address]    CULT  07/11/2022 [phone]    FEW STAPHYLOCOCCUS AUREUS SUSCEPTIBILITIES TO FOLLOW Performed at [facility], [place] [address] [place], [place] [address]    REPTSTATUS PENDING 07/11/2022 0846   REPTSTATUS PENDING 07/11/2022 0846    Cardiac Enzymes: No results for input(s): "CKTOTAL", "CKMB", "CKMBINDEX", "TROPONINI" in the last 168 hours. CBG: Recent  Labs  Lab 07/11/22 1011 07/11/22 1520 07/11/22 2306 07/12/22 0751  GLUCAP 144* 160* 122* 138*   Iron Studies:  No results for input(s): "IRON", "TIBC", "TRANSFERRIN", "FERRITIN" in the last 72 hours.  @lablastinr3 @ Studies/Results: No results found. Medications:  sodium chloride     [START ON 07/13/2022] vancomycin      sodium chloride   Intravenous Once   calcitRIOL  2.5 mcg Oral Q M,W,F-HD   Chlorhexidine Gluconate Cloth  6 each Topical Q0600   Chlorhexidine Gluconate Cloth  6 each Topical Q0600   Chlorhexidine Gluconate Cloth  6 each Topical Q0600   cinacalcet  90 mg Oral Q M,W,F-HD   lanthanum  1,000 mg Oral TID WC   multivitamin  1 tablet Oral QHS   mupirocin ointment  1 Application Nasal BID   pantoprazole  40 mg Oral Daily   polyethylene glycol  17 g Oral Daily   sodium chloride flush  10-40 mL Intracatheter Q12H    Assessment/Recommendations:    ESRD:  -outpatient orders: [facility].  4 hours 15 minutes.  MWF.  EDW 102 kg.  Revaclear 300.  400/500.  2K, 2.5 Cal.  Meds: Calcitriol 2.5 mcg 3 times weekly, Sensipar 90 mg 3 times weekly, Mircera 200 mg every 2 weeks (last  dose on Monday 7/10).  No heparin -HD tomorrow via rt fem temp line (placed 7/17 in OR)   Sepsis, +MRSA bacteremia -ID following - vanc added, cont CTX; bcx pos on 7/14, repeat sent 7/16, will need TEE -avoid placing TDC , has temp HD catheter until cultures clear -intraop gram stain+culture sent 7/17  Brief bradycardic arrest 07/11/22 -possible related to adverse effect of neosynephrine and fentanyl. Extubated in pacu   Abdominal pain, early/partial SBO -mgmt per primary service, appears improved   Volume/ hypertension: EDW 102kg. Attempt to achieve EDW as tolerated   Anemia of Chronic Kidney Disease, acute anemia: Hemoglobin improved to 7s s/p pRBC, cont to monitor -FOBT negative, transfused for Hgb <7 -iron sat 10% but hold IV iron in setting of bacteremia -last dose of mircera per outpatient unit was 7/10   Secondary Hyperparathyroidism/Hyperphosphatemia: on calcitriol and sensipar, will start renvela if po4 trends up. Po4 3.6 today   Vascular access: Ulceration of left upper extremity aVF status post excision and stent graft.  Status post right femoral temp HD line on 7/17 in the OR   Additional recommendations: - Dose all meds for creatinine clearance < 10 ml/min  - Unless absolutely necessary, no MRIs with gadolinium.  - Implement save arm precautions.  Prefer needle sticks in the dorsum of the hands or wrists.  No blood pressure measurements in arm. - If blood transfusion is requested during hemodialysis sessions, please alert [place] prior to the session.  - If a hemodialysis catheter line culture is requested, please alert [place] as only hemodialysis nurses are able to collect those specimens.

## 2022-07-13 ENCOUNTER — Inpatient Hospital Stay (HOSPITAL_COMMUNITY): Payer: Medicare HMO

## 2022-07-13 DIAGNOSIS — R7989 Other specified abnormal findings of blood chemistry: Secondary | ICD-10-CM

## 2022-07-13 DIAGNOSIS — B9562 Methicillin resistant Staphylococcus aureus infection as the cause of diseases classified elsewhere: Secondary | ICD-10-CM

## 2022-07-13 DIAGNOSIS — I879 Disorder of vein, unspecified: Secondary | ICD-10-CM | POA: Diagnosis not present

## 2022-07-13 DIAGNOSIS — R7881 Bacteremia: Secondary | ICD-10-CM

## 2022-07-13 DIAGNOSIS — D649 Anemia, unspecified: Secondary | ICD-10-CM | POA: Diagnosis not present

## 2022-07-13 DIAGNOSIS — D62 Acute posthemorrhagic anemia: Secondary | ICD-10-CM | POA: Diagnosis not present

## 2022-07-13 DIAGNOSIS — N186 End stage renal disease: Secondary | ICD-10-CM | POA: Diagnosis not present

## 2022-07-13 LAB — CBC
HCT: 23.4 % — ABNORMAL LOW (ref 36.0–46.0)
Hemoglobin: 7.8 g/dL — ABNORMAL LOW (ref 12.0–15.0)
MCH: 29.4 pg (ref 26.0–34.0)
MCHC: 33.3 g/dL (ref 30.0–36.0)
MCV: 88.3 fL (ref 80.0–100.0)
Platelets: 207 10*3/uL (ref 150–400)
RBC: 2.65 MIL/uL — ABNORMAL LOW (ref 3.87–5.11)
RDW: 17.7 % — ABNORMAL HIGH (ref 11.5–15.5)
WBC: 12 10*3/uL — ABNORMAL HIGH (ref 4.0–10.5)
nRBC: 0.2 % (ref 0.0–0.2)

## 2022-07-13 LAB — TYPE AND SCREEN
ABO/RH(D): O POS
Antibody Screen: NEGATIVE
Unit division: 0
Unit division: 0

## 2022-07-13 LAB — BASIC METABOLIC PANEL
Anion gap: 10 (ref 5–15)
BUN: 41 mg/dL — ABNORMAL HIGH (ref 6–20)
CO2: 28 mmol/L (ref 22–32)
Calcium: 8.9 mg/dL (ref 8.9–10.3)
Chloride: 96 mmol/L — ABNORMAL LOW (ref 98–111)
Creatinine, Ser: 6.61 mg/dL — ABNORMAL HIGH (ref 0.44–1.00)
GFR, Estimated: 7 mL/min — ABNORMAL LOW (ref >60)
Glucose, Bld: 109 mg/dL — ABNORMAL HIGH (ref 70–99)
Potassium: 3.6 mmol/L (ref 3.5–5.1)
Sodium: 134 mmol/L — ABNORMAL LOW (ref 135–145)

## 2022-07-13 LAB — BPAM RBC
Blood Product Expiration Date: 202308152359
Blood Product Expiration Date: 202308202359
ISSUE DATE / TIME: 202307180536
ISSUE DATE / TIME: 202307180740
Unit Type and Rh: 5100
Unit Type and Rh: 5100

## 2022-07-13 MED ORDER — SODIUM CHLORIDE 0.9 % IV SOLN: INTRAVENOUS | Status: DC

## 2022-07-13 MED ORDER — IOHEXOL 300 MG/ML  SOLN
100.0000 mL | Freq: Once | INTRAMUSCULAR | Status: AC | PRN
  Administered 2022-07-13: 100 mL via INTRAVENOUS

## 2022-07-13 MED ORDER — HEPARIN SODIUM (PORCINE) 1000 UNIT/ML IJ SOLN
INTRAMUSCULAR | Status: AC
  Filled 2022-07-13: qty 1

## 2022-07-13 MED ORDER — DARBEPOETIN ALFA 200 MCG/0.4ML IJ SOSY
200.0000 ug | PREFILLED_SYRINGE | INTRAMUSCULAR | Status: DC
Start: 2022-07-15 — End: 2022-07-20
  Filled 2022-07-13: qty 0.4

## 2022-07-13 MED ORDER — DARBEPOETIN ALFA 100 MCG/0.5ML IJ SOSY: 100.0000 ug | PREFILLED_SYRINGE | INTRAMUSCULAR | Status: DC

## 2022-07-13 NOTE — Progress Notes (Signed)
[facility]  PROGRESS NOTE  Cadee Agro IEP:[id number] DOB: 1976-05-29 DOA: 07/07/2022 PCP: [name], NP   Brief HPI:   46 year old female with medical history of hypertension, ESRD on HD, MWF, PAD, blindness, bilateral transmetatarsal amputation, tobacco abuse came to ED with complaints of lower abdominal/pelvic pain which started on Tuesday afternoon 7/11.  Patient says that after All[place] on Monday she started to have slow bleed from the fistula site of left arm on the following day, Tuesday she developed abdominal pain which was of moderate intensity and worsened with movement.  She saw her PCP who prescribed antibiotic.  Patient states that she missed dialysis on Wednesday due to severity of abdominal pain. Work-up in the hospital revealed symptomatic anemia with acute blood loss anemia from fistula ulcer, sepsis secondary to possible left-sided pyonephritis and MRSA bacteremia.  Patient underwent excision of infected left arm AV fistula and stent graft material with Dr. Stanford Breed on 7/17.  Patient suffered intraoperative cardiac arrest after placement of percutaneous temporary catheter.  ROSC achieved.  Patient completed procedure in the OR then was extubated in PACU.  She was monitored in the ICU on 7/17.   Subjective   Patient seen and examined, denies any complaints.  Awaiting TEE on 07/14/2022   Assessment/Plan:     Symptomatic anemia -Acute blood loss anemia from his fistula ulcers -She has history of anemia, disease -S/p excision of infected left arm AV fistula and stent graft on 7/17 -S/p units PRBC on 7/14 -Received 2 units of PRBC yesterday for hemoglobin of 5.9 -This morning hemoglobin is 7.8  Sepsis secondary to possible left-sided pyelonephritis/MRSA bacteremia -Sepsis POA -Blood cultures positive for MRSA, started on vancomycin -Tissue culture from AV fistula abscess grew Staph aureus -Repeat blood cultures obtained on 7/16 is currently pending -ID  following -TEE scheduled for 7/20  PEA arrest -Developed PEA arrest intraoperative on 7/17 -Patient was extubated in PACU and monitored in the ICU -Transferred to progressive bed  ESRD on hemodialysis, MWF -Nephrology following  ?  SVC occlusion -Seen on CT chest on 06/17/2022 -She was seen by vascular surgery -Venous duplex of right upper extremity was negative for DVT -Patient was discharged on Eliquis which is currently on hold -We will check with vascular  surgery whether she needs anticoagulation at this point; given recent acute blood loss anemia and no  DVT seen on the venous duplex of right upper extremity  Prolonged QT interval -QTc 502 ms -Avoid QT prolonging drugs   Pancreatic cyst -CT abdomen and pelvis showed cystic lesion of the tail of the pancreas measuring 17 mm -Nonemergent contrast-enhanced MRCP was recommended   Essential hypertension -Blood pressure is now elevated; Coreg on hold -Continue  hydralazine as needed  GERD -Protonix   Tobacco use disorder -Patient counseled on tobacco abuse cessation   Coronary calcification -Recent CTA chest reviewed with findings of 4 vessel coronary calcification -Ordered and reviewed 2d echo, normal LVEF with no WMA     Medications     calcitRIOL  2.5 mcg Oral Q M,W,F-HD   Chlorhexidine Gluconate Cloth  6 each Topical Q0600   Chlorhexidine Gluconate Cloth  6 each Topical Q0600   Chlorhexidine Gluconate Cloth  6 each Topical Q0600   cinacalcet  90 mg Oral Q M,W,F-HD   [START ON 07/15/2022] darbepoetin (ARANESP) injection - DIALYSIS  200 mcg Intravenous Q Fri-HD   heparin sodium (porcine)       lanthanum  1,000 mg Oral TID WC   multivitamin  1  tablet Oral QHS   mupirocin ointment  1 Application Nasal BID   pantoprazole  40 mg Oral Daily   polyethylene glycol  17 g Oral Daily   sodium chloride flush  10-40 mL Intracatheter Q12H     Data Reviewed:   CBG:  Recent Labs  Lab 07/11/22 1520 07/11/22 2306  07/12/22 0751 07/12/22 1153 07/12/22 1505  GLUCAP 160* 122* 138* 117* 115*    SpO2: 98 % O2 Flow Rate (L/min): 2 L/min    Vitals:   07/13/22 1415 07/13/22 1426 07/13/22 1443 07/13/22 1500  BP: (!) 152/107 (!) (P) 154/104  (!) 160/124  Pulse: (!) 134 84  82  Resp: 17   19  Temp: 97.7 F (36.5 C)   98.2 F (36.8 C)  TempSrc: Oral   Oral  SpO2: 97%   98%  Weight:   97.2 kg   Height:          Data Reviewed:  Basic Metabolic Panel: Recent Labs  Lab 07/07/22 2320 07/08/22 0936 07/09/22 0521 07/10/22 1025 07/11/22 0329 07/11/22 0920 07/11/22 1145 07/12/22 0317 07/13/22 0400  NA 138 136   < > 136 136 132* 135 135 134*  K 3.7 3.9   < > 3.7 3.6 3.5 3.8 3.3* 3.6  CL 97* 96*   < > 95* 94*  --  91* 93* 96*  CO2 27 27   < > 26 25  --  24 29 28   GLUCOSE 123* 136*   < > 129* 123*  --  188* 130* 109*  BUN 37* 44*   < > 37* 45*  --  51* 30* 41*  CREATININE 7.34* 8.20*   < > 6.86* 7.96*  --  8.39* 5.32* 6.61*  CALCIUM 9.1 8.9   < > 9.6 9.7  --  9.8 9.1 8.9  MG 2.1 1.9  --   --   --   --  1.8 1.7  --   PHOS  --  5.2*  --  4.2 4.0  --   --  3.6  --    < > = values in this interval not displayed.    CBC: Recent Labs  Lab 07/07/22 1215 07/07/22 2320 07/08/22 0936 07/10/22 0221 07/10/22 1025 07/11/22 0329 07/11/22 0920 07/12/22 0317 07/12/22 1144 07/13/22 0400  WBC 6.7 8.4   < > 10.8* 10.5 10.5  --  11.8*  --  12.0*  NEUTROABS 5.9 7.6  --   --   --   --   --   --   --   --   HGB 6.0* 6.7*   < > 7.1* 7.4* 7.0* 6.8* 5.9* 7.8* 7.8*  HCT 19.5* 21.4*   < > 22.0* 23.0* 20.9* 20.0* 18.5* 23.7* 23.4*  MCV 91.1 90.7   < > 87.6 87.5 86.7  --  88.1  --  88.3  PLT 103* 125*   < > 106* 96* 126*  --  160  --  207   < > = values in this interval not displayed.    LFT Recent Labs  Lab 07/07/22 1108 07/08/22 0936 07/09/22 0521 07/10/22 0221 07/10/22 1025 07/11/22 0329  AST 24 17 17 24   --   --   ALT 21 18 18 20   --   --   ALKPHOS 59 55 62 76  --   --   BILITOT 1.2 1.4*  2.7* 3.6*  --   --   PROT 6.1* 5.9* 6.2* 6.1*  --   --  ALBUMIN 3.0* 2.8* 2.9* 2.8* 2.8* 2.8*     Antibiotics: Anti-infectives (From admission, onward)    Start     Dose/Rate Route Frequency Ordered Stop   07/13/22 1200  vancomycin (VANCOCIN) IVPB 1000 mg/200 mL premix        1,000 mg 200 mL/hr over 60 Minutes Intravenous Every M-W-F (Hemodialysis) 07/12/22 0757     07/11/22 1258  vancomycin variable dose per unstable renal function (pharmacist dosing)  Status:  Discontinued         Does not apply See admin instructions 07/11/22 1258 07/12/22 0757   07/11/22 1200  vancomycin (VANCOREADY) IVPB 750 mg/150 mL  Status:  Discontinued        750 mg 150 mL/hr over 60 Minutes Intravenous Every M-W-F (Hemodialysis) 07/10/22 1246 07/11/22 1252   07/11/22 0745  vancomycin (VANCOCIN) IVPB 1000 mg/200 mL premix       Note to Pharmacy: On call to OR for 07:30 start   1,000 mg 200 mL/hr over 60 Minutes Intravenous  Once 07/11/22 0736 07/11/22 0757   07/11/22 0734  vancomycin (VANCOCIN) 1-5 GM/200ML-% IVPB       Note to Pharmacy: Maude Leriche: cabinet override      07/11/22 0734 07/11/22 0806   07/11/22 0730  vancomycin (VANCOCIN) 1,000 mg in sodium chloride 0.9 % 250 mL IVPB  Status:  Discontinued       Note to Pharmacy: On call to OR for 07:30 start   1,000 mg 250 mL/hr over 60 Minutes Intravenous  Once 07/11/22 0728 07/11/22 0733   07/10/22 1345  vancomycin (VANCOREADY) IVPB 750 mg/150 mL        750 mg 150 mL/hr over 60 Minutes Intravenous  Once 07/10/22 1246 07/10/22 1453   07/09/22 0915  vancomycin (VANCOREADY) IVPB 1500 mg/300 mL        1,500 mg 150 mL/hr over 120 Minutes Intravenous  Once 07/09/22 0820 07/09/22 1146   07/08/22 0600  cefTRIAXone (ROCEPHIN) 1 g in sodium chloride 0.9 % 100 mL IVPB  Status:  Discontinued        1 g 200 mL/hr over 30 Minutes Intravenous Every 24 hours 07/08/22 0512 07/10/22 1557        DVT prophylaxis: SCDs  Code Status: Full code  Family  Communication: No family at bedside   CONSULTS nephrology   Objective    Physical Examination:  General-appears in no acute distress Heart-S1-S2, regular, no murmur auscultated Lungs-clear to auscultation bilaterally, no wheezing or crackles auscultated Abdomen-soft, nontender, no organomegaly Extremities-mild edema in the left upper extremity Neuro-alert, oriented x3, no focal deficit noted   Status is: Inpatient: Patient on IV vancomycin; plan for TEE on 07/14/2022        [place]   [facility] If 7PM-7AM, please contact night-coverage at [email or web address], Office  [phone]   07/13/2022, 4:56 PM  LOS: 5 days

## 2022-07-13 NOTE — Progress Notes (Signed)
    Shared Decision Making/Informed Consent   The risks [esophageal damage, perforation (1:10,000 risk), bleeding, pharyngeal hematoma as well as other potential complications associated with conscious sedation including aspiration, arrhythmia, respiratory failure and death], benefits (treatment guidance and diagnostic support) and alternatives of a transesophageal echocardiogram were discussed in detail with Ms. Schicker and she is willing to proceed.    NPO after midnight, TEE planned for tomorrow, pending coagulation labs.

## 2022-07-13 NOTE — TOC Progression Note (Signed)
Transition of Care [facility]) - Progression Note    Patient Details  Name: Rebekah Burton MRN: [id number] Date of Birth: 02-27-1976  Transition of Care [facility]) CM/SW [place], [place] Phone Number: 07/13/2022, 10:03 AM  Clinical Narrative:    CSW was notified by MD that pt is not medically ready to discharge today. CSW spoke with Jamas Lav at [facility] to provide an update. Jamas Lav states pt does not have any family or a LG, and that she has been at the [facility] for about a year. FL2 will need to be faxed to [phone] at discharge. [place] can provide transportation at discharge. TOC will continue to follow.   Expected Discharge Plan: Assisted Living Barriers to Discharge: Continued Medical Work up  Expected Discharge Plan and Services Expected Discharge Plan: Assisted Living In-house Referral: Clinical Social Work                                             Social Determinants of Health (SDOH) Interventions    Readmission Risk Interventions    03/16/2021    2:06 PM  Readmission Risk Prevention Plan  Transportation Screening Complete  PCP or Specialist Appt within 5-7 Days Complete  Home Care Screening Complete  Medication Review (RN CM) Complete

## 2022-07-13 NOTE — Care Management Important Message (Signed)
Important Message  Patient Details  Name: Rebekah Burton MRN: [id number] Date of Birth: [birth year]   Medicare Important Message Given:  Yes     Shelda Altes 07/13/2022, 9:26 AM

## 2022-07-13 NOTE — Progress Notes (Signed)
[facility] Progress Note   Subjective:  pt seen and examined earlier in room. No acute events, still has some LUE pain at surgical site  Objective Vitals:   07/13/22 1023 07/13/22 1053 07/13/22 1130 07/13/22 1153  BP: (!) 177/134 (!) 180/151 (!) 148/107 (!) 146/101  Pulse: 85 80 89   Resp: 18 18 13    Temp:      TempSrc:      SpO2: 100% 100% 100%   Weight:      Height:       Physical Exam General: chronically ill but nontoxic appearing, laying flat in bed Heart: RRR Lungs:clear ant Abdomen: soft, obese Extremities:no edema Dialysis Access: lue avf dressing in place, rt fem temp HD catheter  Additional Objective Labs: Basic Metabolic Panel: Recent Labs  Lab 07/10/22 1025 07/11/22 0329 07/11/22 0920 07/11/22 1145 07/12/22 0317 07/13/22 0400  NA 136 136   < > 135 135 134*  K 3.7 3.6   < > 3.8 3.3* 3.6  CL 95* 94*  --  91* 93* 96*  CO2 26 25  --  24 29 28   GLUCOSE 129* 123*  --  188* 130* 109*  BUN 37* 45*  --  51* 30* 41*  CREATININE 6.86* 7.96*  --  8.39* 5.32* 6.61*  CALCIUM 9.6 9.7  --  9.8 9.1 8.9  PHOS 4.2 4.0  --   --  3.6  --    < > = values in this interval not displayed.   Liver Function Tests: Recent Labs  Lab 07/08/22 0936 07/09/22 0521 07/10/22 0221 07/10/22 1025 07/11/22 0329  AST 17 17 24   --   --   ALT 18 18 20   --   --   ALKPHOS 55 62 76  --   --   BILITOT 1.4* 2.7* 3.6*  --   --   PROT 5.9* 6.2* 6.1*  --   --   ALBUMIN 2.8* 2.9* 2.8* 2.8* 2.8*   Recent Labs  Lab 07/07/22 1108  LIPASE 18   CBC: Recent Labs  Lab 07/07/22 1215 07/07/22 2320 07/08/22 0936 07/10/22 0221 07/10/22 1025 07/11/22 0329 07/11/22 0920 07/12/22 0317 07/12/22 1144 07/13/22 0400  WBC 6.7 8.4   < > 10.8* 10.5 10.5  --  11.8*  --  12.0*  NEUTROABS 5.9 7.6  --   --   --   --   --   --   --   --   HGB 6.0* 6.7*   < > 7.1* 7.4* 7.0*   < > 5.9* 7.8* 7.8*  HCT 19.5* 21.4*   < > 22.0* 23.0* 20.9*   < > 18.5* 23.7* 23.4*  MCV 91.1 90.7   < >  87.6 87.5 86.7  --  88.1  --  88.3  PLT 103* 125*   < > 106* 96* 126*  --  160  --  207   < > = values in this interval not displayed.   Blood Culture    Component Value Date/Time   SDES TISSUE ABSCESS 07/11/2022 0846   SDES ABSCESS 07/11/2022 0846   SPECREQUEST LEFT ARTERIOVENOUS FISTULA ABSC 07/11/2022 0846   SPECREQUEST LEFT ARTERIOVENOUS FISTULA ABSC 07/11/2022 0846   CULT FEW METHICILLIN RESISTANT STAPHYLOCOCCUS AUREUS 07/11/2022 0846   CULT FEW METHICILLIN RESISTANT STAPHYLOCOCCUS AUREUS 07/11/2022 0846   REPTSTATUS PENDING 07/11/2022 0846   REPTSTATUS PENDING 07/11/2022 0846    Cardiac Enzymes: No results for input(s): "CKTOTAL", "CKMB", "CKMBINDEX", "TROPONINI" in the last 168 hours.  CBG: Recent Labs  Lab 07/11/22 1520 07/11/22 2306 07/12/22 0751 07/12/22 1153 07/12/22 1505  GLUCAP 160* 122* 138* 117* 115*   Iron Studies:  No results for input(s): "IRON", "TIBC", "TRANSFERRIN", "FERRITIN" in the last 72 hours.  @lablastinr3 @ Studies/Results: No results found. Medications:  vancomycin      calcitRIOL  2.5 mcg Oral Q M,W,F-HD   Chlorhexidine Gluconate Cloth  6 each Topical Q0600   Chlorhexidine Gluconate Cloth  6 each Topical Q0600   Chlorhexidine Gluconate Cloth  6 each Topical Q0600   cinacalcet  90 mg Oral Q M,W,F-HD   lanthanum  1,000 mg Oral TID WC   multivitamin  1 tablet Oral QHS   mupirocin ointment  1 Application Nasal BID   pantoprazole  40 mg Oral Daily   polyethylene glycol  17 g Oral Daily   sodium chloride flush  10-40 mL Intracatheter Q12H    Assessment/Recommendations:    ESRD:  -outpatient orders: [facility].  4 hours 15 minutes.  MWF.  EDW 102 kg.  Revaclear 300.  400/500.  2K, 2.5 Cal.  Meds: Calcitriol 2.5 mcg 3 times weekly, Sensipar 90 mg 3 times weekly, Mircera 200 mg every 2 weeks (last dose on Monday 7/10).  No heparin -HD today via rt fem temp line (placed 7/17 in OR) -plan for [facility] on Friday w/ VVS   Sepsis, +MRSA  bacteremia -ID following - vanc added, cont CTX; bcx pos on 7/14, repeat sent 7/16, will need TEE -avoid placing TDC , has temp HD catheter until cultures clear -intraop gram stain+culture sent 7/17-NGTD. Planning for [facility] on Friday  Brief bradycardic arrest 07/11/22 -possible related to adverse effect of neosynephrine and fentanyl. Extubated in pacu. Now stable/out of ICU   Abdominal pain, early/partial SBO -mgmt per primary service, appears improved   Volume/ hypertension: EDW 102kg. Attempt to achieve EDW as tolerated   Anemia of Chronic Kidney Disease, acute anemia: Hemoglobin improved to 7s s/p pRBC, cont to monitor -FOBT negative, transfused for Hgb <7 -iron sat 10% but hold IV iron in setting of bacteremia -last dose of mircera per outpatient unit was 7/10, restarting ESA on 7/21   Secondary Hyperparathyroidism/Hyperphosphatemia: on calcitriol and sensipar, will start renvela if po4 trends up. 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  Status post right femoral temp HD line on 7/17 in the OR. [place] planned for Friday especially since repeat cultures have been negative. Appreciate VVS assistance   Additional recommendations: - Dose all meds for creatinine clearance < 10 ml/min  - Unless absolutely necessary, no MRIs with gadolinium.  - Implement save arm precautions.  Prefer needle sticks in the dorsum of the hands or wrists.  No blood pressure measurements in arm. - If blood transfusion is requested during hemodialysis sessions, please alert [place] prior to the session.  - If a hemodialysis catheter line culture is requested, please alert [place] as only hemodialysis nurses are able to collect those specimens.

## 2022-07-13 NOTE — H&P (View-Only) (Signed)
[facility]  PROGRESS NOTE  Rebekah Burton DVV:[id number] DOB: [birth year] DOA: 07/07/2022 PCP: [name], NP   Brief HPI:   46 year old female with medical history of hypertension, ESRD on HD, MWF, PAD, blindness, bilateral transmetatarsal amputation, tobacco abuse came to ED with complaints of lower abdominal/pelvic pain which started on Tuesday afternoon 7/11.  Patient says that after All[place] on Monday she started to have slow bleed from the fistula site of left arm on the following day, Tuesday she developed abdominal pain which was of moderate intensity and worsened with movement.  She saw her PCP who prescribed antibiotic.  Patient states that she missed dialysis on Wednesday due to severity of abdominal pain. Work-up in the hospital revealed symptomatic anemia with acute blood loss anemia from fistula ulcer, sepsis secondary to possible left-sided pyonephritis and MRSA bacteremia.  Patient underwent excision of infected left arm AV fistula and stent graft material with Dr. Stanford Breed on 7/17.  Patient suffered intraoperative cardiac arrest after placement of percutaneous temporary catheter.  ROSC achieved.  Patient completed procedure in the OR then was extubated in PACU.  She was monitored in the ICU on 7/17.   Subjective   Patient seen and examined, denies any complaints.  Awaiting TEE on 07/14/2022   Assessment/Plan:     Symptomatic anemia -Acute blood loss anemia from his fistula ulcers -She has history of anemia, disease -S/p excision of infected left arm AV fistula and stent graft on 7/17 -S/p units PRBC on 7/14 -Received 2 units of PRBC yesterday for hemoglobin of 5.9 -This morning hemoglobin is 7.8  Sepsis secondary to possible left-sided pyelonephritis/MRSA bacteremia -Sepsis POA -Blood cultures positive for MRSA, started on vancomycin -Tissue culture from AV fistula abscess grew Staph aureus -Repeat blood cultures obtained on 7/16 is currently pending -ID  following -TEE scheduled for 7/20  PEA arrest -Developed PEA arrest intraoperative on 7/17 -Patient was extubated in PACU and monitored in the ICU -Transferred to progressive bed  ESRD on hemodialysis, MWF -Nephrology following  ?  SVC occlusion -Seen on CT chest on 06/17/2022 -She was seen by vascular surgery -Venous duplex of right upper extremity was negative for DVT -Patient was discharged on Eliquis which is currently on hold -We will check with vascular  surgery whether she needs anticoagulation at this point; given recent acute blood loss anemia and no  DVT seen on the venous duplex of right upper extremity  Prolonged QT interval -QTc 502 ms -Avoid QT prolonging drugs   Pancreatic cyst -CT abdomen and pelvis showed cystic lesion of the tail of the pancreas measuring 17 mm -Nonemergent contrast-enhanced MRCP was recommended   Essential hypertension -Blood pressure is now elevated; Coreg on hold -Continue  hydralazine as needed  GERD -Protonix   Tobacco use disorder -Patient counseled on tobacco abuse cessation   Coronary calcification -Recent CTA chest reviewed with findings of 4 vessel coronary calcification -Ordered and reviewed 2d echo, normal LVEF with no WMA     Medications     calcitRIOL  2.5 mcg Oral Q M,W,F-HD   Chlorhexidine Gluconate Cloth  6 each Topical Q0600   Chlorhexidine Gluconate Cloth  6 each Topical Q0600   Chlorhexidine Gluconate Cloth  6 each Topical Q0600   cinacalcet  90 mg Oral Q M,W,F-HD   [START ON 07/15/2022] darbepoetin (ARANESP) injection - DIALYSIS  200 mcg Intravenous Q Fri-HD   heparin sodium (porcine)       lanthanum  1,000 mg Oral TID WC   multivitamin  1  tablet Oral QHS   mupirocin ointment  1 Application Nasal BID   pantoprazole  40 mg Oral Daily   polyethylene glycol  17 g Oral Daily   sodium chloride flush  10-40 mL Intracatheter Q12H     Data Reviewed:   CBG:  Recent Labs  Lab 07/11/22 1520 07/11/22 2306  07/12/22 0751 07/12/22 1153 07/12/22 1505  GLUCAP 160* 122* 138* 117* 115*    SpO2: 98 % O2 Flow Rate (L/min): 2 L/min    Vitals:   07/13/22 1415 07/13/22 1426 07/13/22 1443 07/13/22 1500  BP: (!) 152/107 (!) (P) 154/104  (!) 160/124  Pulse: (!) 134 84  82  Resp: 17   19  Temp: 97.7 F (36.5 C)   98.2 F (36.8 C)  TempSrc: Oral   Oral  SpO2: 97%   98%  Weight:   97.2 kg   Height:          Data Reviewed:  Basic Metabolic Panel: Recent Labs  Lab 07/07/22 2320 07/08/22 0936 07/09/22 0521 07/10/22 1025 07/11/22 0329 07/11/22 0920 07/11/22 1145 07/12/22 0317 07/13/22 0400  NA 138 136   < > 136 136 132* 135 135 134*  K 3.7 3.9   < > 3.7 3.6 3.5 3.8 3.3* 3.6  CL 97* 96*   < > 95* 94*  --  91* 93* 96*  CO2 27 27   < > 26 25  --  24 29 28   GLUCOSE 123* 136*   < > 129* 123*  --  188* 130* 109*  BUN 37* 44*   < > 37* 45*  --  51* 30* 41*  CREATININE 7.34* 8.20*   < > 6.86* 7.96*  --  8.39* 5.32* 6.61*  CALCIUM 9.1 8.9   < > 9.6 9.7  --  9.8 9.1 8.9  MG 2.1 1.9  --   --   --   --  1.8 1.7  --   PHOS  --  5.2*  --  4.2 4.0  --   --  3.6  --    < > = values in this interval not displayed.    CBC: Recent Labs  Lab 07/07/22 1215 07/07/22 2320 07/08/22 0936 07/10/22 0221 07/10/22 1025 07/11/22 0329 07/11/22 0920 07/12/22 0317 07/12/22 1144 07/13/22 0400  WBC 6.7 8.4   < > 10.8* 10.5 10.5  --  11.8*  --  12.0*  NEUTROABS 5.9 7.6  --   --   --   --   --   --   --   --   HGB 6.0* 6.7*   < > 7.1* 7.4* 7.0* 6.8* 5.9* 7.8* 7.8*  HCT 19.5* 21.4*   < > 22.0* 23.0* 20.9* 20.0* 18.5* 23.7* 23.4*  MCV 91.1 90.7   < > 87.6 87.5 86.7  --  88.1  --  88.3  PLT 103* 125*   < > 106* 96* 126*  --  160  --  207   < > = values in this interval not displayed.    LFT Recent Labs  Lab 07/07/22 1108 07/08/22 0936 07/09/22 0521 07/10/22 0221 07/10/22 1025 07/11/22 0329  AST 24 17 17 24   --   --   ALT 21 18 18 20   --   --   ALKPHOS 59 55 62 76  --   --   BILITOT 1.2 1.4*  2.7* 3.6*  --   --   PROT 6.1* 5.9* 6.2* 6.1*  --   --  ALBUMIN 3.0* 2.8* 2.9* 2.8* 2.8* 2.8*     Antibiotics: Anti-infectives (From admission, onward)    Start     Dose/Rate Route Frequency Ordered Stop   07/13/22 1200  vancomycin (VANCOCIN) IVPB 1000 mg/200 mL premix        1,000 mg 200 mL/hr over 60 Minutes Intravenous Every M-W-F (Hemodialysis) 07/12/22 0757     07/11/22 1258  vancomycin variable dose per unstable renal function (pharmacist dosing)  Status:  Discontinued         Does not apply See admin instructions 07/11/22 1258 07/12/22 0757   07/11/22 1200  vancomycin (VANCOREADY) IVPB 750 mg/150 mL  Status:  Discontinued        750 mg 150 mL/hr over 60 Minutes Intravenous Every M-W-F (Hemodialysis) 07/10/22 1246 07/11/22 1252   07/11/22 0745  vancomycin (VANCOCIN) IVPB 1000 mg/200 mL premix       Note to Pharmacy: On call to OR for 07:30 start   1,000 mg 200 mL/hr over 60 Minutes Intravenous  Once 07/11/22 0736 07/11/22 0757   07/11/22 0734  vancomycin (VANCOCIN) 1-5 GM/200ML-% IVPB       Note to Pharmacy: Maude Leriche: cabinet override      07/11/22 0734 07/11/22 0806   07/11/22 0730  vancomycin (VANCOCIN) 1,000 mg in sodium chloride 0.9 % 250 mL IVPB  Status:  Discontinued       Note to Pharmacy: On call to OR for 07:30 start   1,000 mg 250 mL/hr over 60 Minutes Intravenous  Once 07/11/22 0728 07/11/22 0733   07/10/22 1345  vancomycin (VANCOREADY) IVPB 750 mg/150 mL        750 mg 150 mL/hr over 60 Minutes Intravenous  Once 07/10/22 1246 07/10/22 1453   07/09/22 0915  vancomycin (VANCOREADY) IVPB 1500 mg/300 mL        1,500 mg 150 mL/hr over 120 Minutes Intravenous  Once 07/09/22 0820 07/09/22 1146   07/08/22 0600  cefTRIAXone (ROCEPHIN) 1 g in sodium chloride 0.9 % 100 mL IVPB  Status:  Discontinued        1 g 200 mL/hr over 30 Minutes Intravenous Every 24 hours 07/08/22 0512 07/10/22 1557        DVT prophylaxis: SCDs  Code Status: Full code  Family  Communication: No family at bedside   CONSULTS nephrology   Objective    Physical Examination:  General-appears in no acute distress Heart-S1-S2, regular, no murmur auscultated Lungs-clear to auscultation bilaterally, no wheezing or crackles auscultated Abdomen-soft, nontender, no organomegaly Extremities-mild edema in the left upper extremity Neuro-alert, oriented x3, no focal deficit noted   Status is: Inpatient: Patient on IV vancomycin; plan for TEE on 07/14/2022        [place]   [facility] If 7PM-7AM, please contact night-coverage at [email or web address], Office  [phone]   07/13/2022, 4:56 PM  LOS: 5 days

## 2022-07-13 NOTE — Progress Notes (Signed)
[facility] [place] PROGRESS NOTE  ASSESSMENT / PLAN: Rebekah Burton is a 46 y.o. female s/p excision of infected left arm fistula with covered stents. MRSA bacteremia. No growth on most recent cultures. Will check CT chest with venous contrast to evaluate central veins. If central veins are patent plan jugular TDC, otherwise will need femoral TDC. Plan [facility] Friday.   SUBJECTIVE: No complaints.  OBJECTIVE: BP (!) 141/83 (BP Location: Left Leg)   Pulse 88   Temp (!) 97.5 F (36.4 C) (Oral)   Resp 20   Ht 5\' 4"  (1.626 m)   Wt 100.7 kg   SpO2 95%   BMI 38.11 kg/m   Intake/Output Summary (Last 24 hours) at 07/13/2022 0905 Last data filed at 07/12/2022 2145 Gross per 24 hour  Intake 655 ml  Output --  Net 655 ml    Constitutional: Obese. Comfortable. Cardiac: regular rate and rhythm. Pulmonary: unlabored Vascular: left arm dressing removed. Incision clean and dry.     Latest Ref Rng & Units 07/13/2022    4:00 AM 07/12/2022   11:44 AM 07/12/2022    3:17 AM  CBC  WBC 4.0 - 10.5 K/uL 12.0   11.8   Hemoglobin 12.0 - 15.0 g/dL 7.8  7.8  5.9   Hematocrit 36.0 - 46.0 % 23.4  23.7  18.5   Platelets 150 - 400 K/uL 207   160         Latest Ref Rng & Units 07/13/2022    4:00 AM 07/12/2022    3:17 AM 07/11/2022   11:45 AM  CMP  Glucose 70 - 99 mg/dL 109  130  188   BUN 6 - 20 mg/dL 41  30  51   Creatinine 0.44 - 1.00 mg/dL 6.61  5.32  8.39   Sodium 135 - 145 mmol/L 134  135  135   Potassium 3.5 - 5.1 mmol/L 3.6  3.3  3.8   Chloride 98 - 111 mmol/L 96  93  91   CO2 22 - 32 mmol/L 28  29  24    Calcium 8.9 - 10.3 mg/dL 8.9  9.1  9.8     Estimated Creatinine Clearance: 12.7 mL/min (A) (by C-G formula based on SCr of 6.61 mg/dL (H)).  Rebekah Burton. Rebekah Breed, MD [facility] Phone Number: [phone] 07/13/2022 9:05 AM

## 2022-07-14 ENCOUNTER — Inpatient Hospital Stay (HOSPITAL_COMMUNITY): Payer: Medicare HMO | Admitting: Anesthesiology

## 2022-07-14 ENCOUNTER — Encounter (HOSPITAL_COMMUNITY)
Admission: EM | Disposition: A | Discharge status: Home / Self Care | Payer: Self-pay | Source: Home / Self Care | Attending: Family Medicine

## 2022-07-14 ENCOUNTER — Inpatient Hospital Stay (HOSPITAL_COMMUNITY): Payer: Medicare HMO

## 2022-07-14 ENCOUNTER — Encounter (HOSPITAL_COMMUNITY): Payer: Self-pay | Admitting: Internal Medicine

## 2022-07-14 DIAGNOSIS — I081 Rheumatic disorders of both mitral and tricuspid valves: Secondary | ICD-10-CM | POA: Diagnosis not present

## 2022-07-14 DIAGNOSIS — I342 Nonrheumatic mitral (valve) stenosis: Secondary | ICD-10-CM | POA: Diagnosis not present

## 2022-07-14 DIAGNOSIS — I3139 Other pericardial effusion (noninflammatory): Secondary | ICD-10-CM

## 2022-07-14 DIAGNOSIS — Z87891 Personal history of nicotine dependence: Secondary | ICD-10-CM

## 2022-07-14 DIAGNOSIS — N186 End stage renal disease: Secondary | ICD-10-CM | POA: Diagnosis not present

## 2022-07-14 DIAGNOSIS — D649 Anemia, unspecified: Secondary | ICD-10-CM | POA: Diagnosis not present

## 2022-07-14 DIAGNOSIS — I340 Nonrheumatic mitral (valve) insufficiency: Secondary | ICD-10-CM

## 2022-07-14 DIAGNOSIS — I120 Hypertensive chronic kidney disease with stage 5 chronic kidney disease or end stage renal disease: Secondary | ICD-10-CM

## 2022-07-14 DIAGNOSIS — Z992 Dependence on renal dialysis: Secondary | ICD-10-CM

## 2022-07-14 HISTORY — PX: TEE WITHOUT CARDIOVERSION: SHX5443

## 2022-07-14 HISTORY — PX: BUBBLE STUDY: SHX6837

## 2022-07-14 LAB — ECHO TEE
MV M vel: 5.21 m/s
MV Peak grad: 108.4 mmHg
Radius: 0.7 cm

## 2022-07-14 LAB — CBC
HCT: 24.4 % — ABNORMAL LOW (ref 36.0–46.0)
Hemoglobin: 7.9 g/dL — ABNORMAL LOW (ref 12.0–15.0)
MCH: 29 pg (ref 26.0–34.0)
MCHC: 32.4 g/dL (ref 30.0–36.0)
MCV: 89.7 fL (ref 80.0–100.0)
Platelets: 233 10*3/uL (ref 150–400)
RBC: 2.72 MIL/uL — ABNORMAL LOW (ref 3.87–5.11)
RDW: 17.8 % — ABNORMAL HIGH (ref 11.5–15.5)
WBC: 11.9 10*3/uL — ABNORMAL HIGH (ref 4.0–10.5)
nRBC: 0.2 % (ref 0.0–0.2)

## 2022-07-14 LAB — HEPATIC FUNCTION PANEL
ALT: 50 U/L — ABNORMAL HIGH (ref 0–44)
AST: 42 U/L — ABNORMAL HIGH (ref 15–41)
Albumin: 2.8 g/dL — ABNORMAL LOW (ref 3.5–5.0)
Alkaline Phosphatase: 103 U/L (ref 38–126)
Bilirubin, Direct: 1 mg/dL — ABNORMAL HIGH (ref 0.0–0.2)
Indirect Bilirubin: 0.9 mg/dL (ref 0.3–0.9)
Total Bilirubin: 1.9 mg/dL — ABNORMAL HIGH (ref 0.3–1.2)
Total Protein: 6.4 g/dL — ABNORMAL LOW (ref 6.5–8.1)

## 2022-07-14 LAB — BASIC METABOLIC PANEL
Anion gap: 13 (ref 5–15)
BUN: 48 mg/dL — ABNORMAL HIGH (ref 6–20)
CO2: 27 mmol/L (ref 22–32)
Calcium: 9.3 mg/dL (ref 8.9–10.3)
Chloride: 94 mmol/L — ABNORMAL LOW (ref 98–111)
Creatinine, Ser: 6.92 mg/dL — ABNORMAL HIGH (ref 0.44–1.00)
GFR, Estimated: 7 mL/min — ABNORMAL LOW (ref >60)
Glucose, Bld: 110 mg/dL — ABNORMAL HIGH (ref 70–99)
Potassium: 3.7 mmol/L (ref 3.5–5.1)
Sodium: 134 mmol/L — ABNORMAL LOW (ref 135–145)

## 2022-07-14 LAB — PROTIME-INR
INR: 1.2 (ref 0.8–1.2)
Prothrombin Time: 14.8 seconds (ref 11.4–15.2)

## 2022-07-14 SURGERY — ECHOCARDIOGRAM, TRANSESOPHAGEAL
Anesthesia: Monitor Anesthesia Care

## 2022-07-14 MED ORDER — DEXMEDETOMIDINE (PRECEDEX) IN NS 20 MCG/5ML (4 MCG/ML) IV SYRINGE
PREFILLED_SYRINGE | INTRAVENOUS | Status: DC | PRN
  Administered 2022-07-14 (×4): 4 ug via INTRAVENOUS

## 2022-07-14 MED ORDER — VASOPRESSIN 20 UNIT/ML IV SOLN
INTRAVENOUS | Status: DC | PRN
  Administered 2022-07-14: 1 [IU] via INTRAVENOUS

## 2022-07-14 MED ORDER — PROPOFOL 10 MG/ML IV BOLUS
INTRAVENOUS | Status: DC | PRN
  Administered 2022-07-14 (×2): 10 mg via INTRAVENOUS

## 2022-07-14 MED ORDER — PROPOFOL 500 MG/50ML IV EMUL
INTRAVENOUS | Status: DC | PRN
  Administered 2022-07-14: 125 ug/kg/min via INTRAVENOUS

## 2022-07-14 MED ORDER — CARVEDILOL 25 MG PO TABS
25.0000 mg | ORAL_TABLET | Freq: Two times a day (BID) | ORAL | Status: DC
  Administered 2022-07-14 – 2022-07-19 (×8): 25 mg via ORAL
  Filled 2022-07-14 (×9): qty 1

## 2022-07-14 MED ORDER — KETAMINE HCL 10 MG/ML IJ SOLN
INTRAMUSCULAR | Status: DC | PRN
  Administered 2022-07-14: 10 mg via INTRAVENOUS
  Administered 2022-07-14: 5 mg via INTRAVENOUS
  Administered 2022-07-14: 10 mg via INTRAVENOUS

## 2022-07-14 MED ORDER — GLYCOPYRROLATE 0.2 MG/ML IJ SOLN
INTRAMUSCULAR | Status: DC | PRN
  Administered 2022-07-14: .1 mg via INTRAVENOUS

## 2022-07-14 MED ORDER — PHENYLEPHRINE 80 MCG/ML (10ML) SYRINGE FOR IV PUSH (FOR BLOOD PRESSURE SUPPORT)
PREFILLED_SYRINGE | INTRAVENOUS | Status: DC | PRN
  Administered 2022-07-14 (×2): 160 ug via INTRAVENOUS

## 2022-07-14 MED ORDER — SODIUM CHLORIDE 0.9 % IV SOLN: INTRAVENOUS | Status: DC

## 2022-07-14 MED ORDER — KETAMINE HCL 50 MG/5ML IJ SOSY
PREFILLED_SYRINGE | INTRAMUSCULAR | Status: AC
  Filled 2022-07-14: qty 5

## 2022-07-14 MED ORDER — LIDOCAINE HCL URETHRAL/MUCOSAL 2 % EX GEL
CUTANEOUS | Status: DC | PRN
  Administered 2022-07-14: 1 via TOPICAL

## 2022-07-14 NOTE — Progress Notes (Signed)
[facility] Progress Note   Subjective:  pt seen and examined earlier this AM. No acute events. Endorsed hunger-NPO for TEE today. Tolerated HD yesterday with net UF 4L  Objective Vitals:   07/14/22 0010 07/14/22 0522 07/14/22 0744 07/14/22 0920  BP: (!) 144/94 125/85 (!) 174/97 (!) 158/108  Pulse: 96 95 96   Resp: 17 15 20 13   Temp: 98.3 F (36.8 C) 98.5 F (36.9 C) 97.9 F (36.6 C) 97.9 F (36.6 C)  TempSrc: Oral Oral Oral Oral  SpO2: 91% 94% 90% 92%  Weight:  97.3 kg  97.3 kg  Height:    5\' 4"  (1.626 m)   Physical Exam General: chronically ill but nontoxic appearing, laying flat in bed Heart: RRR Lungs:clear ant Abdomen: soft, obese Extremities:no edema Dialysis Access: lue avf dressing in place, rt fem temp HD catheter  Additional Objective Labs: Basic Metabolic Panel: Recent Labs  Lab 07/10/22 1025 07/11/22 0329 07/11/22 0920 07/12/22 0317 07/13/22 0400 07/14/22 0540  NA 136 136   < > 135 134* 134*  K 3.7 3.6   < > 3.3* 3.6 3.7  CL 95* 94*   < > 93* 96* 94*  CO2 26 25   < > 29 28 27   GLUCOSE 129* 123*   < > 130* 109* 110*  BUN 37* 45*   < > 30* 41* 48*  CREATININE 6.86* 7.96*   < > 5.32* 6.61* 6.92*  CALCIUM 9.6 9.7   < > 9.1 8.9 9.3  PHOS 4.2 4.0  --  3.6  --   --    < > = values in this interval not displayed.   Liver Function Tests: Recent Labs  Lab 07/09/22 0521 07/10/22 0221 07/10/22 1025 07/11/22 0329 07/14/22 0540  AST 17 24  --   --  42*  ALT 18 20  --   --  50*  ALKPHOS 62 76  --   --  103  BILITOT 2.7* 3.6*  --   --  1.9*  PROT 6.2* 6.1*  --   --  6.4*  ALBUMIN 2.9* 2.8* 2.8* 2.8* 2.8*   Recent Labs  Lab 07/07/22 1108  LIPASE 18   CBC: Recent Labs  Lab 07/07/22 1215 07/07/22 2320 07/08/22 0936 07/10/22 1025 07/11/22 0329 07/11/22 0920 07/12/22 0317 07/12/22 1144 07/13/22 0400 07/14/22 0540  WBC 6.7 8.4   < > 10.5 10.5  --  11.8*  --  12.0* 11.9*  NEUTROABS 5.9 7.6  --   --   --   --   --   --   --    --   HGB 6.0* 6.7*   < > 7.4* 7.0*   < > 5.9* 7.8* 7.8* 7.9*  HCT 19.5* 21.4*   < > 23.0* 20.9*   < > 18.5* 23.7* 23.4* 24.4*  MCV 91.1 90.7   < > 87.5 86.7  --  88.1  --  88.3 89.7  PLT 103* 125*   < > 96* 126*  --  160  --  207 233   < > = values in this interval not displayed.   Blood Culture    Component Value Date/Time   SDES TISSUE ABSCESS 07/11/2022 0846   SDES ABSCESS 07/11/2022 0846   SPECREQUEST LEFT ARTERIOVENOUS FISTULA ABSC 07/11/2022 0846   SPECREQUEST LEFT ARTERIOVENOUS FISTULA ABSC 07/11/2022 0846   CULT  07/11/2022 0846    FEW METHICILLIN RESISTANT STAPHYLOCOCCUS AUREUS NO ANAEROBES ISOLATED; CULTURE IN PROGRESS FOR 5 DAYS  CULT  07/11/2022 0846    FEW METHICILLIN RESISTANT STAPHYLOCOCCUS AUREUS NO ANAEROBES ISOLATED; CULTURE IN PROGRESS FOR 5 DAYS    REPTSTATUS PENDING 07/11/2022 0846   REPTSTATUS PENDING 07/11/2022 0846    Cardiac Enzymes: No results for input(s): "CKTOTAL", "CKMB", "CKMBINDEX", "TROPONINI" in the last 168 hours. CBG: Recent Labs  Lab 07/11/22 1520 07/11/22 2306 07/12/22 0751 07/12/22 1153 07/12/22 1505  GLUCAP 160* 122* 138* 117* 115*   Iron Studies:  No results for input(s): "IRON", "TIBC", "TRANSFERRIN", "FERRITIN" in the last 72 hours.  @[email or web address] @ Studies/Results: CT CHEST W CONTRAST  Result Date: 07/13/2022 CLINICAL DATA:  Central venous stenosis EXAM: CT CHEST WITH CONTRAST TECHNIQUE: Multidetector CT imaging of the chest was performed during intravenous contrast administration. RADIATION DOSE REDUCTION: This exam was performed according to the departmental dose-optimization program which includes automated exposure control, adjustment of the mA and/or kV according to patient size and/or use of iterative reconstruction technique. CONTRAST:  159mL OMNIPAQUE IOHEXOL 300 MG/ML  SOLN COMPARISON:  06/17/2022 FINDINGS: Cardiovascular: Extensive multi-vessel coronary artery calcification. Mild global cardiomegaly. Stable small  pericardial effusion. The central pulmonary arteries are enlarged in keeping with changes of pulmonary arterial hypertension. Moderate atherosclerotic calcifications seen within the thoracic aorta. No aortic aneurysm. Evaluation of the high-grade stenosis at the superior vena caval confluence is not well assessed on this examination as the terminal right subclavian vein is occluded and there is collateralization of injected contrast through numerous right body wall collaterals ultimately draining to the azygous vein and opacifying the inferior aspect of the superior vena cava at the cavoatrial junction. There is mural calcification again identified within the terminal brachiocephalic vein and proximal superior vena cava, best appreciated axial image # 41/3, similar to prior examination in keeping with chronic thrombus and stenosis at this juncture. 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 This appears similar to prior examination. Mediastinum/Nodes: The visualized thyroid is unremarkable. Numerous asymmetrically pathologically enlarged left axillary and subpectoral lymph nodes are identified, measuring up to 17 mm in short axis diameter at axial image # 40/3, slightly progressive since prior examination. Additional shotty mediastinal adenopathy is stable and may be reactive in nature. The esophagus is unremarkable. Lungs/Pleura: There is diffuse ground-glass opacity again identified throughout the lungs most suggestive of mild alveolar pulmonary edema. Small right pleural effusion is unchanged. Several pulmonary nodules have developed in the interval since prior examination distributed randomly within the lungs measuring up to 11 mm within the right upper lobe and within the left lower lobe, likely inflammatory given its relatively  rapid development since prior examination. No pneumothorax. Central airways are widely patent. Upper Abdomen: The visualized kidneys appear atrophic. Cystic lesion within the tail the pancreas measures 19 mm in dimension, stable since prior examination, not well characterized on this examination. No acute abnormality. Musculoskeletal: No acute bone abnormality. No lytic or blastic bone lesion. IMPRESSION: 1. 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 This is not optimally evaluated on this examination as the right subclavian vein is occluded centrally and the injection was made through the right upper extremity. This appears unchanged, however, since prior examination of 06/17/2022 where a complete occlusion of the superior vena  cava was identified spanning roughly 1 cm above the and suggests arch. This would be better assessed, particularly if there is intention for potential recanalization, with formal venography through the left upper extremity. 2. Extensive multi-vessel coronary artery calcification. 3. Mild global cardiomegaly. Stable small pericardial effusion. 4. Stable small right pleural effusion. 5. Interval development of multiple randomly distributed pulmonary nodules measuring up to 11 mm, likely inflammatory given its relatively rapid development since prior examination. 6. Progressive left axillary adenopathy. This is asymmetric with the shotty adenopathy within the mediastinum which may be reactive in nature and may represent reactive adenopathy related to an inflammatory process within the left chest wall or left upper extremity or pathologic adenopathy. Correlation with recent mammography and clinical breast examination is recommended. 7. Stable 19 mm cystic lesion within  the tail the pancreas, not well characterized on this examination. This could be better assessed with MRI examination, if indicated. Electronically Signed   By: Fidela Salisbury M.D.   On: 07/13/2022 21:33   Medications:  sodium chloride     sodium chloride     [MAR Hold] vancomycin Stopped (07/13/22 1803)    [MAR Hold] calcitRIOL  2.5 mcg Oral Q M,W,F-HD   [MAR Hold] Chlorhexidine Gluconate Cloth  6 each Topical Q0600   [MAR Hold] Chlorhexidine Gluconate Cloth  6 each Topical Q0600   [MAR Hold] Chlorhexidine Gluconate Cloth  6 each Topical Q0600   [MAR Hold] cinacalcet  90 mg Oral Q M,W,F-HD   [MAR Hold] darbepoetin (ARANESP) injection - DIALYSIS  200 mcg Intravenous Q Fri-HD   [MAR Hold] lanthanum  1,000 mg Oral TID WC   [MAR Hold] multivitamin  1 tablet Oral QHS   [MAR Hold] mupirocin ointment  1 Application Nasal BID   [MAR Hold] pantoprazole  40 mg Oral Daily   [MAR Hold] polyethylene glycol  17 g Oral Daily   [MAR Hold] sodium chloride flush  10-40 mL Intracatheter Q12H    Assessment/Recommendations:    ESRD:  -outpatient orders: [facility].  4 hours 15 minutes.  MWF.  EDW 102 kg.  Revaclear 300.  400/500.  2K, 2.5 Cal.  Meds: Calcitriol 2.5 mcg 3 times weekly, Sensipar 90 mg 3 times weekly, Mircera 200 mg every 2 weeks (last dose on Monday 7/10).  No heparin -will continue with HD on MWF schedule. HD today via rt fem temp line (placed 7/17 in OR), next HD planned for tomorrow (can do it after [facility] placement) -plan for femoral TDC on Friday w/ VVS (has SVC occlusion)   Sepsis, +MRSA bacteremia -ID following - vanc added, cont CTX; bcx pos on 7/14, repeat sent 7/16 -avoid placing TDC , has temp HD catheter until cultures clear -intraop gram stain+culture sent 7/17-NGTD. Planning for [facility] on Friday -TEE today  Brief bradycardic arrest 07/11/22 -possible related to adverse effect of neosynephrine and fentanyl. Extubated in pacu. Now stable/out of ICU   Abdominal pain, early/partial  SBO -mgmt per primary service, appears improved   Volume/ hypertension: EDW 102kg. Attempt to achieve EDW as tolerated   Anemia of Chronic Kidney Disease, acute anemia: Hemoglobin improved to 7s s/p pRBC, cont to monitor -FOBT negative, transfused for Hgb <7 -iron sat 10% but hold IV iron in setting of bacteremia -last dose of mircera per outpatient unit was 7/10, restarting ESA on 7/21   Secondary Hyperparathyroidism/Hyperphosphatemia: on calcitriol and sensipar, will start renvela if po4 trends up. Po4 3.6 on 7/18   Vascular access: Ulceration of left upper extremity aVF status  post excision and stent graft.  Status post right femoral temp HD line on 7/17 in the OR. Femoral TDC planned for Friday especially since repeat cultures have been negative and given that she has SVC occlusion. Appreciate VVS assistance   Additional recommendations: - Dose all meds for creatinine clearance < 10 ml/min  - Unless absolutely necessary, no MRIs with gadolinium.  - Implement save arm precautions.  Prefer needle sticks in the dorsum of the hands or wrists.  No blood pressure measurements in arm. - If blood transfusion is requested during hemodialysis sessions, please alert [place] prior to the session.  - If a hemodialysis catheter line culture is requested, please alert [place] as only hemodialysis nurses are able to collect those specimens.

## 2022-07-14 NOTE — Transfer of Care (Signed)
Immediate Anesthesia Transfer of Care Note  Patient: Rebekah Burton  Procedure(s) Performed: TRANSESOPHAGEAL ECHOCARDIOGRAM (TEE) BUBBLE STUDY  Patient Location: PACU  Anesthesia Type:MAC  Level of Consciousness: drowsy  Airway & Oxygen Therapy: Patient Spontanous Breathing and Patient connected to face mask oxygen  Post-op Assessment: Report given to RN and Post -op Vital signs reviewed and stable  Post vital signs: Reviewed and stable  Last Vitals:  Vitals Value Taken Time  BP 171/122 07/14/22 1112  Temp 36.6 C 07/14/22 1107  Pulse 89 07/14/22 1116  Resp 0 07/14/22 1116  SpO2 91 % 07/14/22 1116  Vitals shown include unvalidated device data.  Last Pain:  Vitals:   07/14/22 1112  TempSrc:   PainSc: 0-No pain      Patients Stated Pain Goal: 3 (03/15/21 4825)  Complications: No notable events documented.

## 2022-07-14 NOTE — Progress Notes (Signed)
IV Antibiotics at Discharge   Indication: MRSA Bacteremia/possible finding on mitral valve  Regimen: Vancomycin  1000 mg with HD every MWF  End date: 08/17/22  No formal OPAT will be done as patient will receive antibiotics with dialysis.    Thank you for allowing pharmacy to be a part of this patient's care.  Jimmy Footman, PharmD, BCPS, BCIDP Infectious Diseases Clinical Pharmacist Phone: [phone] 07/14/2022, 3:28 PM

## 2022-07-14 NOTE — Progress Notes (Signed)
[facility]ious Disease   Reason for visit: Follow up on bacteremia  Interval History: now s/p TEE and echodense mobile mass on mitral annulus most c/w calcification.  WBC 11.9.  Remains afebrile.    Physical Exam: Constitutional:  Vitals:   07/14/22 1122 07/14/22 1200  BP: (!) 184/101 (!) 161/121  Pulse: 90 97  Resp: (!) 0 19  Temp:  98.1 F (36.7 C)  SpO2: 90% 91%   patient appears in NAD Respiratory: Normal respiratory effort  Review of Systems: Constitutional: negative for fevers and chills  Lab Results  Component Value Date   WBC 11.9 (H) 07/14/2022   HGB 7.9 (L) 07/14/2022   HCT 24.4 (L) 07/14/2022   MCV 89.7 07/14/2022   PLT 233 07/14/2022    Lab Results  Component Value Date   CREATININE 6.92 (H) 07/14/2022   BUN 48 (H) 07/14/2022   NA 134 (L) 07/14/2022   K 3.7 07/14/2022   CL 94 (L) 07/14/2022   CO2 27 07/14/2022    Lab Results  Component Value Date   ALT 50 (H) 07/14/2022   AST 42 (H) 07/14/2022   ALKPHOS 103 07/14/2022     Microbiology: Recent Results (from the past 240 hour(s))  Culture, blood (Routine X 2) w Reflex to ID Panel     Status: Abnormal   Collection Time: 07/08/22  9:36 AM   Specimen: BLOOD  Result Value Ref Range Status   Specimen Description BLOOD RIGHT ANTECUBITAL  Final   Special Requests   Final    BOTTLES DRAWN AEROBIC AND ANAEROBIC Blood Culture results may not be optimal due to an excessive volume of blood received in culture bottles   Culture  Setup Time   Final    GRAM POSITIVE COCCI Gram Stain Report Called to,Read Back By and Verified With: MUSE,J ON 07/08/22 AT 2330 BY LOY,C AEROBIC AND ANAEROBIC BOTTLES PERFORMED AT [facility] CRITICAL RESULT CALLED TO, READ BACK BY AND VERIFIED WITH: J MUSE,RN@0623  07/09/22 [place] Performed at [facility], [place] [address] [place], [place] [address]    Culture METHICILLIN RESISTANT STAPHYLOCOCCUS AUREUS (A)  Final   Report Status 07/11/2022 FINAL  Final   Organism  ID, Bacteria METHICILLIN RESISTANT STAPHYLOCOCCUS AUREUS  Final      Susceptibility   Methicillin resistant staphylococcus aureus - MIC*    CIPROFLOXACIN <=0.5 SENSITIVE Sensitive     ERYTHROMYCIN >=8 RESISTANT Resistant     GENTAMICIN <=0.5 SENSITIVE Sensitive     OXACILLIN >=4 RESISTANT Resistant     TETRACYCLINE <=1 SENSITIVE Sensitive     VANCOMYCIN 1 SENSITIVE Sensitive     TRIMETH/SULFA <=10 SENSITIVE Sensitive     CLINDAMYCIN <=0.25 SENSITIVE Sensitive     RIFAMPIN <=0.5 SENSITIVE Sensitive     Inducible Clindamycin NEGATIVE Sensitive     * METHICILLIN RESISTANT STAPHYLOCOCCUS AUREUS  Culture, blood (Routine X 2) w Reflex to ID Panel     Status: Abnormal   Collection Time: 07/08/22  9:36 AM   Specimen: BLOOD RIGHT HAND  Result Value Ref Range Status   Specimen Description   Final    BLOOD RIGHT HAND BOTTLES DRAWN AEROBIC AND ANAEROBIC Performed at [facility], [address], [place], [place] [address]    Special Requests   Final    Blood Culture adequate volume Performed at [facility], [address] [place], [place] [address]    Culture  Setup Time   Final    GRAM POSITIVE COCCI Gram Stain  Report Called to,Read Back By and Verified With: MUSE, J ON 07/08/22 AT 2330 BY LOY,C AEROBIC AND ANAEROBIC BOTTLES PERFORMED AT [facility] Performed at [facility], [address], [place], [place] [address]    Culture (A)  Final    STAPHYLOCOCCUS AUREUS SUSCEPTIBILITIES PERFORMED ON PREVIOUS CULTURE WITHIN THE LAST 5 DAYS. Performed at [facility], [place] [address], [place], [place] [address]    Report Status 07/11/2022 FINAL  Final  Blood Culture ID Panel (Reflexed)     Status: Abnormal   Collection Time: 07/08/22  9:36 AM  Result Value Ref Range Status   Enterococcus faecalis NOT DETECTED NOT DETECTED Final   Enterococcus Faecium NOT DETECTED NOT DETECTED Final   Listeria monocytogenes NOT DETECTED NOT DETECTED Final   Staphylococcus species DETECTED (A) NOT DETECTED Final     Comment: CRITICAL RESULT CALLED TO, READ BACK BY AND VERIFIED WITH: J MUSE,RN@0623  07/09/22 [place]    Staphylococcus aureus (BCID) DETECTED (A) NOT DETECTED Final    Comment: Methicillin (oxacillin)-resistant Staphylococcus aureus (MRSA). MRSA is predictably resistant to beta-lactam antibiotics (except ceftaroline). Preferred therapy is vancomycin unless clinically contraindicated. Patient requires contact precautions if  hospitalized. CRITICAL RESULT CALLED TO, READ BACK BY AND VERIFIED WITH: J MUSE,RN@0623  07/09/22 [place]    Staphylococcus epidermidis NOT DETECTED NOT DETECTED Final   Staphylococcus lugdunensis NOT DETECTED NOT DETECTED Final   Streptococcus species NOT DETECTED NOT DETECTED Final   Streptococcus agalactiae NOT DETECTED NOT DETECTED Final   Streptococcus pneumoniae NOT DETECTED NOT DETECTED Final   Streptococcus pyogenes NOT DETECTED NOT DETECTED Final   A.calcoaceticus-baumannii NOT DETECTED NOT DETECTED Final   Bacteroides fragilis NOT DETECTED NOT DETECTED Final   Enterobacterales NOT DETECTED NOT DETECTED Final   Enterobacter cloacae complex NOT DETECTED NOT DETECTED Final   Escherichia coli NOT DETECTED NOT DETECTED Final   Klebsiella aerogenes NOT DETECTED NOT DETECTED Final   Klebsiella oxytoca NOT DETECTED NOT DETECTED Final   Klebsiella pneumoniae NOT DETECTED NOT DETECTED Final   Proteus species NOT DETECTED NOT DETECTED Final   Salmonella species NOT DETECTED NOT DETECTED Final   Serratia marcescens NOT DETECTED NOT DETECTED Final   Haemophilus influenzae NOT DETECTED NOT DETECTED Final   Neisseria meningitidis NOT DETECTED NOT DETECTED Final   Pseudomonas aeruginosa NOT DETECTED NOT DETECTED Final   Stenotrophomonas maltophilia NOT DETECTED NOT DETECTED Final   Candida albicans NOT DETECTED NOT DETECTED Final   Candida auris NOT DETECTED NOT DETECTED Final   Candida glabrata NOT DETECTED NOT DETECTED Final   Candida krusei NOT DETECTED NOT DETECTED Final    Candida parapsilosis NOT DETECTED NOT DETECTED Final   Candida tropicalis NOT DETECTED NOT DETECTED Final   Cryptococcus neoformans/gattii NOT DETECTED NOT DETECTED Final   Meth resistant mecA/C and MREJ DETECTED (A) NOT DETECTED Final    Comment: CRITICAL RESULT CALLED TO, READ BACK BY AND VERIFIED WITH: J MUSE,RN@0623  07/09/22 [place] Performed at [facility], [address], [place], [place] [address]   Culture, blood (Routine X 2) w Reflex to ID Panel     Status: None (Preliminary result)   Collection Time: 07/10/22  4:36 PM   Specimen: BLOOD LEFT ARM  Result Value Ref Range Status   Specimen Description BLOOD LEFT ARM  Final   Special Requests   Final    BOTTLES DRAWN AEROBIC AND ANAEROBIC Blood Culture results may not be optimal due to an inadequate volume of blood received in culture bottles   Culture   Final  NO GROWTH 4 DAYS Performed at [facility], [place] [address], [place], [place] [address]    Report Status PENDING  Incomplete  Culture, blood (Routine X 2) w Reflex to ID Panel     Status: None (Preliminary result)   Collection Time: 07/10/22  4:36 PM   Specimen: BLOOD RIGHT WRIST  Result Value Ref Range Status   Specimen Description BLOOD RIGHT WRIST  Final   Special Requests   Final    BOTTLES DRAWN AEROBIC AND ANAEROBIC Blood Culture adequate volume   Culture   Final    NO GROWTH 4 DAYS Performed at [facility], [place] [address], [place], [place] [address]    Report Status PENDING  Incomplete  Surgical pcr screen     Status: Abnormal   Collection Time: 07/11/22  6:06 AM   Specimen: Nasal Mucosa; Nasal Swab  Result Value Ref Range Status   MRSA, PCR POSITIVE (A) NEGATIVE Final   Staphylococcus aureus POSITIVE (A) NEGATIVE Final    Comment: RESULT CALLED TO, READ BACK BY AND VERIFIED WITH: RN [email or web address].A AT 5329 ON 07/11/2022 BY T.SAAD. (NOTE) The Xpert SA Assay (FDA approved for NASAL specimens in patients 87 years of age and older), is one component  of a comprehensive surveillance program. It is not intended to diagnose infection nor to guide or monitor treatment. Performed at [facility], [place] [address], [place], [place] [address]   Aerobic/Anaerobic Culture w Gram Stain (surgical/deep wound)     Status: None (Preliminary result)   Collection Time: 07/11/22  8:46 AM   Specimen: PATH Other; Tissue  Result Value Ref Range Status   Specimen Description TISSUE ABSCESS  Final   Special Requests LEFT ARTERIOVENOUS FISTULA ABSC  Final   Gram Stain   Final    FEW WBC PRESENT,BOTH PMN AND MONONUCLEAR NO ORGANISMS SEEN Performed at [facility], [address], [place], [place] [address]    Culture   Final    FEW METHICILLIN RESISTANT STAPHYLOCOCCUS AUREUS NO ANAEROBES ISOLATED; CULTURE IN PROGRESS FOR 5 DAYS    Report Status PENDING  Incomplete   Organism ID, Bacteria METHICILLIN RESISTANT STAPHYLOCOCCUS AUREUS  Final      Susceptibility   Methicillin resistant staphylococcus aureus - MIC*    CIPROFLOXACIN <=0.5 SENSITIVE Sensitive     ERYTHROMYCIN >=8 RESISTANT Resistant     GENTAMICIN <=0.5 SENSITIVE Sensitive     OXACILLIN >=4 RESISTANT Resistant     TETRACYCLINE <=1 SENSITIVE Sensitive     VANCOMYCIN <=0.5 SENSITIVE Sensitive     TRIMETH/SULFA <=10 SENSITIVE Sensitive     CLINDAMYCIN <=0.25 SENSITIVE Sensitive     RIFAMPIN <=0.5 SENSITIVE Sensitive     Inducible Clindamycin NEGATIVE Sensitive     * FEW METHICILLIN RESISTANT STAPHYLOCOCCUS AUREUS  Aerobic/Anaerobic Culture w Gram Stain (surgical/deep wound)     Status: None (Preliminary result)   Collection Time: 07/11/22  8:46 AM   Specimen: PATH Other; Tissue  Result Value Ref Range Status   Specimen Description ABSCESS  Final   Special Requests LEFT ARTERIOVENOUS FISTULA ABSC  Final   Gram Stain   Final    FEW WBC PRESENT,BOTH PMN AND MONONUCLEAR RARE GRAM POSITIVE COCCI Performed at [facility], [address], [place], [place] [address]     Culture   Final    FEW METHICILLIN RESISTANT STAPHYLOCOCCUS AUREUS NO ANAEROBES ISOLATED; CULTURE IN PROGRESS FOR 5 DAYS    Report Status PENDING  Incomplete  Organism ID, Bacteria METHICILLIN RESISTANT STAPHYLOCOCCUS AUREUS  Final      Susceptibility   Methicillin resistant staphylococcus aureus - MIC*    CIPROFLOXACIN <=0.5 SENSITIVE Sensitive     ERYTHROMYCIN >=8 RESISTANT Resistant     GENTAMICIN <=0.5 SENSITIVE Sensitive     OXACILLIN >=4 RESISTANT Resistant     TETRACYCLINE <=1 SENSITIVE Sensitive     VANCOMYCIN <=0.5 SENSITIVE Sensitive     TRIMETH/SULFA <=10 SENSITIVE Sensitive     CLINDAMYCIN <=0.25 SENSITIVE Sensitive     RIFAMPIN <=0.5 SENSITIVE Sensitive     Inducible Clindamycin NEGATIVE Sensitive     * FEW METHICILLIN RESISTANT STAPHYLOCOCCUS AUREUS    Impression/Plan:  1. 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  Source removed with infected fistula.  TEE noted and no definitive vegetation but calcification noted.   At this point, with the finding on the mitral valve, I recommend a full 6 weeks of vancomycin after dialysis from the 1st negative blood culture 7/13 to finish on 08/17/22 then stop.    2.  Access - plan for [facility] placement tomorrow noted and agree, ok to place from an infection standpoint.   3.  ESRD - on intermittent hemodialysis and will receive vancomycin on dialysis days.    I will sign off, call with any questions.

## 2022-07-14 NOTE — Anesthesia Preprocedure Evaluation (Signed)
Anesthesia Evaluation  Patient identified by MRN, date of birth, ID band Patient awake    Reviewed: Allergy & Precautions, NPO status , Patient's Chart, lab work & pertinent test results, reviewed documented beta blocker date and time   History of Anesthesia Complications Negative for: history of anesthetic complications  Airway Mallampati: III  TM Distance: >3 FB Neck ROM: Full    Dental  (+) Teeth Intact, Dental Advisory Given   Pulmonary neg pulmonary ROS, former smoker,    Pulmonary exam normal        Cardiovascular hypertension, Pt. on medications and Pt. on home beta blockers + Peripheral Vascular Disease  Normal cardiovascular exam+ Valvular Problems/Murmurs  Rhythm:Regular Rate:Normal + Systolic murmurs IMPRESSIONS    1. Left ventricular ejection fraction, by estimation, is 65 to 70%. The  left ventricle has normal function. The left ventricle has no regional  wall motion abnormalities. There is severe concentric left ventricular  hypertrophy. Left ventricular diastolic  parameters are consistent with Grade II diastolic dysfunction  (pseudonormalization). Elevated left ventricular end-diastolic pressure.  2. Right ventricular systolic function is normal. The right ventricular  size is normal. There is moderately elevated pulmonary artery systolic  pressure. The estimated right ventricular systolic pressure is 24.8 mmHg.  3. Left atrial size was severely dilated.  4. Right atrial size was mildly dilated.  5. The mitral valve is degenerative. Heavy calcification of the chordae  tendinae off the anterior and posterior mitral valve leaflets. Severely  decreased mobility of the posterior mitral valve leaflet. Moderate mitral  annular calcification. Trivial  mitral valve regurgitation. Moderate mitral valve stenosis. MV peak  gradient, 13.2 mmHg. The mean mitral valve gradient is 5.0 mmHg. MVA (VTI)  2.02cm2.    Trivial mitral valve regurgitation. Moderate mitral stenosis. Moderate  mitral annular calcification.  6. The aortic valve is tricuspid. Aortic valve regurgitation is not  visualized. Aortic valve sclerosis/calcification is present, without any  evidence of aortic stenosis. Aortic valve area, by VTI measures 2.30 cm.  Aortic valve mean gradient measures  7.5 mmHg. Aortic valve Vmax measures 2.09 m/s.  7. The inferior vena cava is dilated in size with >50% respiratory  variability, suggesting right atrial pressure of 8 mmHg.    Neuro/Psych negative neurological ROS     GI/Hepatic negative GI ROS, Neg liver ROS,   Endo/Other  negative endocrine ROS  Renal/GU ESRF and DialysisRenal diseaseLast dialysis yesterday K 3.2  negative genitourinary   Musculoskeletal negative musculoskeletal ROS (+)   Abdominal   Peds  Hematology  (+) Blood dyscrasia, anemia ,   Anesthesia Other Findings   Reproductive/Obstetrics                             Anesthesia Physical  Anesthesia Plan  ASA: 3  Anesthesia Plan: MAC   Post-op Pain Management: Minimal or no pain anticipated   Induction:   PONV Risk Score and Plan: 2 and Ondansetron, Treatment may vary due to age or medical condition and Propofol infusion  Airway Management Planned: Natural Airway and Simple Face Mask  Additional Equipment: None  Intra-op Plan:   Post-operative Plan: Extubation in OR  Informed Consent: I have reviewed the patients History and Physical, chart, labs and discussed the procedure including the risks, benefits and alternatives for the proposed anesthesia with the patient or authorized representative who has indicated his/her understanding and acceptance.     Dental advisory given  Plan Discussed with: Anesthesiologist  Anesthesia  Plan Comments:         Anesthesia Quick Evaluation

## 2022-07-14 NOTE — Progress Notes (Signed)
  Progress Note    07/14/2022 7:49 AM 3 Days Post-Op  Subjective:  no complaints   Vitals:   07/14/22 0010 07/14/22 0522  BP: (!) 144/94 125/85  Pulse: 96 95  Resp: 17 15  Temp: 98.3 F (36.8 C) 98.5 F (36.9 C)  SpO2: 91% 94%   Physical Exam: Lungs:  non labored Extremities:  L arm incisions c/d/i Abdomen:  soft Neurologic: A&O  CBC    Component Value Date/Time   WBC 11.9 (H) 07/14/2022 0540   RBC 2.72 (L) 07/14/2022 0540   HGB 7.9 (L) 07/14/2022 0540   HCT 24.4 (L) 07/14/2022 0540   PLT 233 07/14/2022 0540   MCV 89.7 07/14/2022 0540   MCH 29.0 07/14/2022 0540   MCHC 32.4 07/14/2022 0540   RDW 17.8 (H) 07/14/2022 0540   LYMPHSABS 0.2 (L) 07/07/2022 2320   MONOABS 0.4 07/07/2022 2320   EOSABS 0.1 07/07/2022 2320   BASOSABS 0.0 07/07/2022 2320    BMET    Component Value Date/Time   NA 134 (L) 07/14/2022 0540   K 3.7 07/14/2022 0540   CL 94 (L) 07/14/2022 0540   CO2 27 07/14/2022 0540   GLUCOSE 110 (H) 07/14/2022 0540   BUN 48 (H) 07/14/2022 0540   CREATININE 6.92 (H) 07/14/2022 0540   CALCIUM 9.3 07/14/2022 0540   GFRNONAA 7 (L) 07/14/2022 0540   GFRAA 5 (L) 02/13/2019 0734    INR    Component Value Date/Time   INR 1.2 07/14/2022 0540     Intake/Output Summary (Last 24 hours) at 07/14/2022 0749 Last data filed at 07/13/2022 2138 Gross per 24 hour  Intake 250 ml  Output 4000 ml  Net -3750 ml     Assessment/Plan:  46 y.o. 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 Stanford Breed NPO past midnight Consent ordered    Dagoberto Ligas, PA-C [facility] [phone] 07/14/2022 7:49 AM

## 2022-07-14 NOTE — Progress Notes (Signed)
[facility]  PROGRESS NOTE  Rebekah Burton PXT:[id number] DOB: 07-04-1976 DOA: 07/07/2022 PCP: [name], NP   Brief HPI:   46 year old female with medical history of hypertension, ESRD on HD, MWF, PAD, blindness, bilateral transmetatarsal amputation, tobacco abuse came to ED with complaints of lower abdominal/pelvic pain which started on Tuesday afternoon 7/11.  Patient says that after [place] on Monday she started to have slow bleed from the fistula site of left arm on the following day, Tuesday she developed abdominal pain which was of moderate intensity and worsened with movement.  She saw her PCP who prescribed antibiotic.  Patient states that she missed dialysis on Wednesday due to severity of abdominal pain. Work-up in the hospital revealed symptomatic anemia with acute blood loss anemia from fistula ulcer, sepsis secondary to possible left-sided pyonephritis and MRSA bacteremia.  Patient underwent excision of infected left arm AV fistula and stent graft material with Dr. Stanford Breed on 7/17.  Patient suffered intraoperative cardiac arrest after placement of percutaneous temporary catheter.  ROSC achieved.  Patient completed procedure in the OR then was extubated in PACU.  She was monitored in the ICU on 7/17.   Subjective   Patient seen and examined, underwent TEE.  TEE showed echodense mobile mass on mitral annulus likely calcification.   Assessment/Plan:     Symptomatic anemia -Acute blood loss anemia from his fistula ulcers -She has history of anemia, disease -S/p excision of infected left arm AV fistula and stent graft on 7/17 -S/p units PRBC on 7/14 -Received 2 units of PRBC yesterday for hemoglobin of 5.9 -This morning hemoglobin is 7.8  Sepsis secondary to possible left-sided pyelonephritis/MRSA bacteremia -Sepsis POA -Blood cultures positive for MRSA, started on vancomycin -Tissue culture from AV fistula abscess grew Staph aureus -Repeat blood cultures obtained  on 7/16 is currently pending -ID following -TEE done today shows echodense mobile mass on mitral annulus likely calcification -ID recommends to continue with IV vancomycin for 6 weeks till 08/17/2022  PEA arrest -Developed PEA arrest intraoperative on 7/17 -Patient was extubated in PACU and monitored in the ICU -Transferred to progressive bed  ESRD on hemodialysis, MWF -Nephrology following  ?  SVC occlusion -Seen on CT chest on 06/17/2022 -She was seen by vascular surgery -Venous duplex of right upper extremity was negative for DVT -Patient was discharged on Eliquis which is currently on hold -We will check with vascular  surgery whether she needs anticoagulation at this point; given recent acute blood loss anemia and no  DVT seen on the venous duplex of right upper extremity  Prolonged QT interval -QTc 502 ms -Avoid QT prolonging drugs   Pancreatic cyst -CT abdomen and pelvis showed cystic lesion of the tail of the pancreas measuring 17 mm -Nonemergent contrast-enhanced MRCP was recommended   Essential hypertension -Blood pressure is now elevated; will restart Coreg -Continue  hydralazine as needed  GERD -Protonix   Tobacco use disorder -Patient counseled on tobacco abuse cessation   Coronary calcification -Recent CTA chest reviewed with findings of 4 vessel coronary calcification -Ordered and reviewed 2d echo, normal LVEF with no WMA     Medications     calcitRIOL  2.5 mcg Oral Q M,W,F-HD   Chlorhexidine Gluconate Cloth  6 each Topical Q0600   Chlorhexidine Gluconate Cloth  6 each Topical Q0600   Chlorhexidine Gluconate Cloth  6 each Topical Q0600   cinacalcet  90 mg Oral Q M,W,F-HD   [START ON 07/15/2022] darbepoetin (ARANESP) injection - DIALYSIS  200 mcg Intravenous  Q Fri-HD   lanthanum  1,000 mg Oral TID WC   multivitamin  1 tablet Oral QHS   mupirocin ointment  1 Application Nasal BID   pantoprazole  40 mg Oral Daily   polyethylene glycol  17 g Oral Daily    sodium chloride flush  10-40 mL Intracatheter Q12H     Data Reviewed:   CBG:  Recent Labs  Lab 07/11/22 1520 07/11/22 2306 07/12/22 0751 07/12/22 1153 07/12/22 1505  GLUCAP 160* 122* 138* 117* 115*    SpO2: 91 % O2 Flow Rate (L/min): 2 L/min    Vitals:   07/14/22 1118 07/14/22 1120 07/14/22 1122 07/14/22 1200  BP: (!) 175/121 (!) 184/101 (!) 184/101 (!) 161/121  Pulse: 94 91 90 97  Resp: 10 (!) 0 (!) 0 19  Temp:    98.1 F (36.7 C)  TempSrc:    Oral  SpO2: 90% (!) 89% 90% 91%  Weight:      Height:          Data Reviewed:  Basic Metabolic Panel: Recent Labs  Lab 07/07/22 2320 07/08/22 0936 07/09/22 0521 07/10/22 1025 07/11/22 0329 07/11/22 0920 07/11/22 1145 07/12/22 0317 07/13/22 0400 07/14/22 0540  NA 138 136   < > 136 136 132* 135 135 134* 134*  K 3.7 3.9   < > 3.7 3.6 3.5 3.8 3.3* 3.6 3.7  CL 97* 96*   < > 95* 94*  --  91* 93* 96* 94*  CO2 27 27   < > 26 25  --  24 29 28 27   GLUCOSE 123* 136*   < > 129* 123*  --  188* 130* 109* 110*  BUN 37* 44*   < > 37* 45*  --  51* 30* 41* 48*  CREATININE 7.34* 8.20*   < > 6.86* 7.96*  --  8.39* 5.32* 6.61* 6.92*  CALCIUM 9.1 8.9   < > 9.6 9.7  --  9.8 9.1 8.9 9.3  MG 2.1 1.9  --   --   --   --  1.8 1.7  --   --   PHOS  --  5.2*  --  4.2 4.0  --   --  3.6  --   --    < > = values in this interval not displayed.    CBC: Recent Labs  Lab 07/07/22 2320 07/08/22 0936 07/10/22 1025 07/11/22 0329 07/11/22 0920 07/12/22 0317 07/12/22 1144 07/13/22 0400 07/14/22 0540  WBC 8.4   < > 10.5 10.5  --  11.8*  --  12.0* 11.9*  NEUTROABS 7.6  --   --   --   --   --   --   --   --   HGB 6.7*   < > 7.4* 7.0* 6.8* 5.9* 7.8* 7.8* 7.9*  HCT 21.4*   < > 23.0* 20.9* 20.0* 18.5* 23.7* 23.4* 24.4*  MCV 90.7   < > 87.5 86.7  --  88.1  --  88.3 89.7  PLT 125*   < > 96* 126*  --  160  --  207 233   < > = values in this interval not displayed.    LFT Recent Labs  Lab 07/08/22 0936 07/09/22 0521 07/10/22 0221  07/10/22 1025 07/11/22 0329 07/14/22 0540  AST 17 17 24   --   --  42*  ALT 18 18 20   --   --  50*  ALKPHOS 55 62 76  --   --  103  BILITOT 1.4* 2.7* 3.6*  --   --  1.9*  PROT 5.9* 6.2* 6.1*  --   --  6.4*  ALBUMIN 2.8* 2.9* 2.8* 2.8* 2.8* 2.8*     Antibiotics: Anti-infectives (From admission, onward)    Start     Dose/Rate Route Frequency Ordered Stop   07/13/22 1200  vancomycin (VANCOCIN) IVPB 1000 mg/200 mL premix        1,000 mg 200 mL/hr over 60 Minutes Intravenous Every M-W-F (Hemodialysis) 07/12/22 0757     07/11/22 1258  vancomycin variable dose per unstable renal function (pharmacist dosing)  Status:  Discontinued         Does not apply See admin instructions 07/11/22 1258 07/12/22 0757   07/11/22 1200  vancomycin (VANCOREADY) IVPB 750 mg/150 mL  Status:  Discontinued        750 mg 150 mL/hr over 60 Minutes Intravenous Every M-W-F (Hemodialysis) 07/10/22 1246 07/11/22 1252   07/11/22 0745  vancomycin (VANCOCIN) IVPB 1000 mg/200 mL premix       Note to Pharmacy: On call to OR for 07:30 start   1,000 mg 200 mL/hr over 60 Minutes Intravenous  Once 07/11/22 0736 07/11/22 0757   07/11/22 0734  vancomycin (VANCOCIN) 1-5 GM/200ML-% IVPB       Note to Pharmacy: Maude Leriche: cabinet override      07/11/22 0734 07/11/22 0806   07/11/22 0730  vancomycin (VANCOCIN) 1,000 mg in sodium chloride 0.9 % 250 mL IVPB  Status:  Discontinued       Note to Pharmacy: On call to OR for 07:30 start   1,000 mg 250 mL/hr over 60 Minutes Intravenous  Once 07/11/22 0728 07/11/22 0733   07/10/22 1345  vancomycin (VANCOREADY) IVPB 750 mg/150 mL        750 mg 150 mL/hr over 60 Minutes Intravenous  Once 07/10/22 1246 07/10/22 1453   07/09/22 0915  vancomycin (VANCOREADY) IVPB 1500 mg/300 mL        1,500 mg 150 mL/hr over 120 Minutes Intravenous  Once 07/09/22 0820 07/09/22 1146   07/08/22 0600  cefTRIAXone (ROCEPHIN) 1 g in sodium chloride 0.9 % 100 mL IVPB  Status:  Discontinued        1  g 200 mL/hr over 30 Minutes Intravenous Every 24 hours 07/08/22 0512 07/10/22 1557        DVT prophylaxis: SCDs  Code Status: Full code  Family Communication: No family at bedside   CONSULTS nephrology   Objective    Physical Examination:  General-appears in no acute distress Heart-S1-S2, regular, no murmur auscultated Lungs-clear to auscultation bilaterally, no wheezing or crackles auscultated Abdomen-soft, nontender, no organomegaly Extremities-no edema in the lower extremities Neuro-alert, oriented x3, no focal deficit noted   Status is: Inpatient: Patient on IV vancomycin;         [place]   [facility] If 7PM-7AM, please contact night-coverage at [email or web address], Office  [phone]   07/14/2022, 2:53 PM  LOS: 6 days

## 2022-07-14 NOTE — Anesthesia Postprocedure Evaluation (Signed)
Anesthesia Post Note  Patient: Monserat Prestigiacomo  Procedure(s) Performed: TRANSESOPHAGEAL ECHOCARDIOGRAM (TEE) BUBBLE STUDY     Patient location during evaluation: Endoscopy Anesthesia Type: MAC Level of consciousness: awake and alert Pain management: pain level controlled Vital Signs Assessment: post-procedure vital signs reviewed and stable Respiratory status: spontaneous breathing, nonlabored ventilation, respiratory function stable and patient connected to nasal cannula oxygen Cardiovascular status: blood pressure returned to baseline and stable Postop Assessment: no apparent nausea or vomiting Anesthetic complications: no   No notable events documented.  Last Vitals:  Vitals:   07/14/22 1120 07/14/22 1122  BP: (!) 184/101 (!) 184/101  Pulse: 91 90  Resp: (!) 0 (!) 0  Temp:    SpO2: (!) 89% 90%    Last Pain:  Vitals:   07/14/22 1112  TempSrc:   PainSc: 0-No pain                 Jodeen Mclin DANIEL

## 2022-07-14 NOTE — Interval H&P Note (Signed)
History and Physical Interval Note:  07/14/2022 8:50 AM  Rebekah Burton  has presented today for surgery, with the diagnosis of bactoremia.  The various methods of treatment have been discussed with the patient and family. After consideration of risks, benefits and other options for treatment, the patient has consented to  Procedure(s): TRANSESOPHAGEAL ECHOCARDIOGRAM (TEE) (N/A) as a surgical intervention.  The patient's history has been reviewed, patient examined, no change in status, stable for surgery.  I have reviewed the patient's chart and labs.  Questions were answered to the patient's satisfaction.     [name]

## 2022-07-14 NOTE — CV Procedure (Addendum)
    TRANSESOPHAGEAL ECHOCARDIOGRAM   NAME:  Rebekah Burton    MRN: [id number] DOB:  05/15/1976    ADMIT DATE: 07/07/2022  INDICATIONS: Endocarditis, Mitral stenosis  PROCEDURE:   Informed consent was obtained prior to the procedure. The risks, benefits and alternatives for the procedure were discussed and the patient comprehended these risks.  Risks include, but are not limited to, cough, sore throat, vomiting, nausea, somnolence, esophageal and stomach trauma or perforation, bleeding, low blood pressure, aspiration, pneumonia, infection, trauma to the teeth and death.    Procedural time out performed. The oropharynx was anesthetized with viscous lidocaine.  Anesthesia was administered by the anaesthesilogy team to achieve and maintain moderate to deep conscious sedation.  The patient's heart rate, blood pressure, and oxygen saturation were monitored continuously during the procedure.  The transesophageal probe was inserted in the esophagus and stomach without difficulty and multiple views were obtained.   The patient tolerated the procedure well.  COMPLICATIONS:    There were no immediate complications.  KEY FINDINGS:  Normal left ventricular ejection fraction, severe mitral annular calcification, mitral regurgitation, mitral stenosis, tricuspid regurgitation, Pericardial effusion, SVC stenosis. Echodense mobile mass on mitral annulus likely calcification - follow up official report. Full report to follow for quantification of the valvular disease. Further management per primary team.   Berniece Salines, DO [place]  11:06 AM

## 2022-07-15 LAB — CBC
HCT: 24.4 % — ABNORMAL LOW (ref 36.0–46.0)
Hemoglobin: 8.2 g/dL — ABNORMAL LOW (ref 12.0–15.0)
MCH: 29.4 pg (ref 26.0–34.0)
MCHC: 33.6 g/dL (ref 30.0–36.0)
MCV: 87.5 fL (ref 80.0–100.0)
Platelets: 259 10*3/uL (ref 150–400)
RBC: 2.79 MIL/uL — ABNORMAL LOW (ref 3.87–5.11)
RDW: 17.9 % — ABNORMAL HIGH (ref 11.5–15.5)
WBC: 12.4 10*3/uL — ABNORMAL HIGH (ref 4.0–10.5)
nRBC: 0 % (ref 0.0–0.2)

## 2022-07-15 LAB — BASIC METABOLIC PANEL
Anion gap: 16 — ABNORMAL HIGH (ref 5–15)
BUN: 57 mg/dL — ABNORMAL HIGH (ref 6–20)
CO2: 23 mmol/L (ref 22–32)
Calcium: 9.9 mg/dL (ref 8.9–10.3)
Chloride: 92 mmol/L — ABNORMAL LOW (ref 98–111)
Creatinine, Ser: 8.51 mg/dL — ABNORMAL HIGH (ref 0.44–1.00)
GFR, Estimated: 5 mL/min — ABNORMAL LOW (ref >60)
Glucose, Bld: 128 mg/dL — ABNORMAL HIGH (ref 70–99)
Potassium: 4.6 mmol/L (ref 3.5–5.1)
Sodium: 131 mmol/L — ABNORMAL LOW (ref 135–145)

## 2022-07-15 LAB — CULTURE, BLOOD (ROUTINE X 2)
Culture: NO GROWTH
Culture: NO GROWTH
Special Requests: ADEQUATE

## 2022-07-15 MED ORDER — PROPOFOL 10 MG/ML IV BOLUS
INTRAVENOUS | Status: AC
  Filled 2022-07-15: qty 20

## 2022-07-15 MED ORDER — DARBEPOETIN ALFA 200 MCG/0.4ML IJ SOSY
200.0000 ug | PREFILLED_SYRINGE | INTRAMUSCULAR | Status: AC
  Administered 2022-07-16: 200 ug via INTRAVENOUS
  Filled 2022-07-15 (×2): qty 0.4

## 2022-07-15 MED ORDER — VANCOMYCIN HCL IN DEXTROSE 1-5 GM/200ML-% IV SOLN
1000.0000 mg | INTRAVENOUS | Status: AC
  Administered 2022-07-16: 1000 mg via INTRAVENOUS
  Filled 2022-07-15 (×2): qty 200

## 2022-07-15 NOTE — Progress Notes (Signed)
Spoke to [place] at [facility] to provide update on pt and to advise clinic pt will need iv vanc with HD at d/c. Today's notes faxed to clinic per their request.   Melven Sartorius Renal Navigator [phone]

## 2022-07-15 NOTE — Progress Notes (Signed)
[facility] Progress Note   Subjective:  pt seen and examined earlier this AM. No acute events. S/p tee yesterday, open for [facility] today.  Objective Vitals:   07/14/22 1547 07/14/22 1923 07/15/22 0002 07/15/22 0410  BP: (!) 152/104 (!) 187/114 (!) 147/104 (!) 157/117  Pulse: 84  96 99  Resp: 20 18 16 15   Temp: 97.8 F (36.6 C) 98.5 F (36.9 C) 98.5 F (36.9 C) 99 F (37.2 C)  TempSrc: Oral Oral Oral Oral  SpO2: 96% 97% 94% 92%  Weight:    97 kg  Height:       Physical Exam General: chronically ill but nontoxic appearing, laying flat in bed Heart: RRR Lungs:clear ant Abdomen: soft, obese Extremities:no edema Dialysis Access: lue avf dressing in place, rt fem temp HD catheter  Additional Objective Labs: Basic Metabolic Panel: Recent Labs  Lab 07/10/22 1025 07/11/22 0329 07/11/22 0920 07/12/22 0317 07/13/22 0400 07/14/22 0540 07/15/22 0410  NA 136 136   < > 135 134* 134* 131*  K 3.7 3.6   < > 3.3* 3.6 3.7 4.6  CL 95* 94*   < > 93* 96* 94* 92*  CO2 26 25   < > 29 28 27 23   GLUCOSE 129* 123*   < > 130* 109* 110* 128*  BUN 37* 45*   < > 30* 41* 48* 57*  CREATININE 6.86* 7.96*   < > 5.32* 6.61* 6.92* 8.51*  CALCIUM 9.6 9.7   < > 9.1 8.9 9.3 9.9  PHOS 4.2 4.0  --  3.6  --   --   --    < > = values in this interval not displayed.   Liver Function Tests: Recent Labs  Lab 07/09/22 0521 07/10/22 0221 07/10/22 1025 07/11/22 0329 07/14/22 0540  AST 17 24  --   --  42*  ALT 18 20  --   --  50*  ALKPHOS 62 76  --   --  103  BILITOT 2.7* 3.6*  --   --  1.9*  PROT 6.2* 6.1*  --   --  6.4*  ALBUMIN 2.9* 2.8* 2.8* 2.8* 2.8*   No results for input(s): "LIPASE", "AMYLASE" in the last 168 hours.  CBC: Recent Labs  Lab 07/11/22 0329 07/11/22 0920 07/12/22 0317 07/12/22 1144 07/13/22 0400 07/14/22 0540 07/15/22 0410  WBC 10.5  --  11.8*  --  12.0* 11.9* 12.4*  HGB 7.0*   < > 5.9*   < > 7.8* 7.9* 8.2*  HCT 20.9*   < > 18.5*   < > 23.4* 24.4* 24.4*   MCV 86.7  --  88.1  --  88.3 89.7 87.5  PLT 126*  --  160  --  207 233 259   < > = values in this interval not displayed.   Blood Culture    Component Value Date/Time   SDES TISSUE ABSCESS 07/11/2022 0846   SDES ABSCESS 07/11/2022 0846   SPECREQUEST LEFT ARTERIOVENOUS FISTULA ABSC 07/11/2022 0846   SPECREQUEST LEFT ARTERIOVENOUS FISTULA ABSC 07/11/2022 0846   CULT  07/11/2022 0846    FEW METHICILLIN RESISTANT STAPHYLOCOCCUS AUREUS NO ANAEROBES ISOLATED; CULTURE IN PROGRESS FOR 5 DAYS    CULT  07/11/2022 0846    FEW METHICILLIN RESISTANT STAPHYLOCOCCUS AUREUS NO ANAEROBES ISOLATED; CULTURE IN PROGRESS FOR 5 DAYS    REPTSTATUS PENDING 07/11/2022 0846   REPTSTATUS PENDING 07/11/2022 0846    Cardiac Enzymes: No results for input(s): "CKTOTAL", "CKMB", "CKMBINDEX", "TROPONINI" in the last  168 hours. CBG: Recent Labs  Lab 07/11/22 1520 07/11/22 2306 07/12/22 0751 07/12/22 1153 07/12/22 1505  GLUCAP 160* 122* 138* 117* 115*   Iron Studies:  No results for input(s): "IRON", "TIBC", "TRANSFERRIN", "FERRITIN" in the last 72 hours.  @lablastinr3 @ Studies/Results: ECHO TEE  Result Date: 07/14/2022    TRANSESOPHOGEAL ECHO REPORT   Patient Name:   Rebekah Burton Date of Exam: 07/14/2022 Medical Rec #:  [id number]    Height:       64.0 in Accession #:    [id number]   Weight:       214.5 lb Date of Birth:  09/16/1976    BSA:          2.015 m Patient Age:    46 years     BP:           144/93 mmHg Patient Gender: F            HR:           91 bpm. Exam Location:  Inpatient Procedure: Transesophageal Echo, 3D Echo, Color Doppler and Cardiac Doppler Indications:     Bacteremia  History:         Patient has prior history of Echocardiogram examinations, most                  recent 07/08/2022. Risk Factors:Hypertension. SVC Occlusion.  Sonographer:     Raquel Sarna Senior Referring Phys:  [id number] Margie Billet Diagnosing Phys: Berniece Salines DO PROCEDURE: After discussion of the risks and benefits of a TEE, an  informed consent was obtained from the patient. The transesophogeal probe was passed without difficulty through the esophogus of the patient. Local oropharyngeal anesthetic was provided with Cetacaine. Sedation performed by different physician. The patient was monitored while under deep sedation. Anesthestetic sedation was provided intravenously by Anesthesiology: 340mg  of Propofol. The patient's vital signs; including heart rate, blood  pressure, and oxygen saturation; remained stable throughout the procedure. The patient developed no complications during the procedure. IMPRESSIONS  1. Left ventricular ejection fraction, by estimation, is 60 to 65%. The left ventricle has normal function. The left ventricle has no regional wall motion abnormalities.  2. Right ventricular systolic function is normal. The right ventricular size is normal. There is severely elevated pulmonary artery systolic pressure.  3. No left atrial/left atrial appendage thrombus was detected.  4. Mobile echodensity at the posterior mitral annulus with severe mitral annular calcification, suspect that this mobile echodensity is an extending of the calcification. Can not rule out a vegetation. The mitral valve is degenerative. Moderate to severe mitral valve regurgitation. Mild functional mitral stenosis. Mean gradient 3 mmHg at heart rate 90 bpm and blood pressure 134/92 mmHg. Severe mitral annular calcification.  5. Tricuspid valve regurgitation is moderate to severe.  6. The aortic valve is normal in structure. Aortic valve regurgitation is not visualized. No aortic stenosis is present.  7. There is a linear echodense mass in the SVC which does not appear to be a catheter, the SVC appears to be stenosed at the SVC-RA junction. The inferior vena cava is normal in size with greater than 50% respiratory variability, suggesting right atrial  pressure of 3 mmHg.  8. Moderate pericardial effusion. The pericardial effusion is anterior to the right  ventricle. There is no evidence of cardiac tamponade.  9. There is Moderate (Grade III) layered and protruding plaque involving the descending aorta and ascending aorta. Conclusion(s)/Recommendation(s): Normal biventricular function without evidence of hemodynamically significant valvular heart disease. FINDINGS  Left Ventricle: Left ventricular ejection fraction, by estimation, is 60 to 65%. The left ventricle has normal function. The left ventricle has no regional wall motion abnormalities. The left ventricular internal cavity size was normal in size. There is  no left ventricular hypertrophy. Right Ventricle: The right ventricular size is normal. No increase in right ventricular wall thickness. Right ventricular systolic function is normal. There is severely elevated pulmonary artery systolic pressure. Left Atrium: Left atrial size was normal in size. No left atrial/left atrial appendage thrombus was detected. Right Atrium: Right atrial size was normal in size. Pericardium: A moderately sized pericardial effusion is present. The pericardial effusion is anterior to the right ventricle. There is no evidence of cardiac tamponade. Mitral Valve: Mobile echodensity at the posterior mitral annulus with severe mitral annular calcification, suspect that this mobile echodensity is an extending of the calcification. In this clinical picture can not rule out a vegetation. The mitral valve  is degenerative in appearance. Severe mitral annular calcification. Moderate to severe mitral valve regurgitation. Mild functional mitral valve stenosis. Mean gradient 14mmHg at heart rate 90 bpm and blood pressure 134/92 mmHg. MV peak gradient, 5.4 mmHg. The mean mitral valve gradient is 3.0 mmHg. Tricuspid Valve: The tricuspid valve is normal in structure. Tricuspid valve regurgitation is moderate to severe. No evidence of tricuspid stenosis. Aortic Valve: The aortic valve is normal in structure. Aortic valve regurgitation is not  visualized. No aortic stenosis is present. Pulmonic Valve: The pulmonic valve was normal in structure. Pulmonic valve regurgitation is trivial. No evidence of pulmonic stenosis. Aorta: The aortic root is normal in size and structure. There is moderate (Grade III) layered and protruding plaque involving the descending aorta and ascending aorta. Pulmonary Artery: The pulmonary artery is not well seen. Venous: The left upper pulmonary vein, left lower pulmonary vein, right upper pulmonary vein and right lower pulmonary vein are normal. A pattern of systolic flow reversal, suggestive of severe mitral regurgitation is recorded from the left upper pulmonary vein. There is a linear echodense mass in the SVC which does not appear to be a catheter, the SVC appears to be stenosed at the SVC-RA junction. The inferior vena cava is normal in size with greater than 50% respiratory variability, suggesting right atrial pressure of 3 mmHg. IAS/Shunts: No atrial level shunt detected by color flow Doppler.  MITRAL VALVE                  TRICUSPID VALVE MV Peak grad: 5.4 mmHg        TR Peak grad:   64.0 mmHg MV Mean grad: 3.0 mmHg        TR Vmax:        400.00 cm/s MV Vmax:      1.16 m/s MV Vmean:     85.3 cm/s MR Peak grad:    108.4 mmHg MR Mean grad:    67.5 mmHg MR Vmax:         520.50 cm/s MR Vmean:        384.0 cm/s MR PISA:         3.08 cm MR PISA Eff ROA: 18 mm MR PISA Radius:  0.70 cm Kardie Tobb DO Electronically signed by Berniece Salines DO Signature Date/Time: 07/14/2022/5:57:40 PM    Final    CT CHEST W CONTRAST  Result Date: 07/13/2022 CLINICAL DATA:  Central venous stenosis EXAM: CT CHEST WITH CONTRAST TECHNIQUE: Multidetector CT imaging of the chest was performed during intravenous contrast administration. RADIATION DOSE REDUCTION:  This exam was performed according to the departmental dose-optimization program which includes automated exposure control, adjustment of the mA and/or kV according to patient size and/or use  of iterative reconstruction technique. CONTRAST:  157mL OMNIPAQUE IOHEXOL 300 MG/ML  SOLN COMPARISON:  06/17/2022 FINDINGS: Cardiovascular: Extensive multi-vessel coronary artery calcification. Mild global cardiomegaly. Stable small pericardial effusion. The central pulmonary arteries are enlarged in keeping with changes of pulmonary arterial hypertension. Moderate atherosclerotic calcifications seen within the thoracic aorta. No aortic aneurysm. Evaluation of the high-grade stenosis at the superior vena caval confluence is not well assessed on this examination as the terminal right subclavian vein is occluded and there is collateralization of injected contrast through numerous right body wall collaterals ultimately draining to the azygous vein and opacifying the inferior aspect of the superior vena cava at the cavoatrial junction. There is mural calcification again identified within the terminal brachiocephalic vein and proximal superior vena cava, best appreciated axial image # 41/3, similar to prior examination in keeping with chronic thrombus and stenosis at this juncture. 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 This appears similar to prior examination. Mediastinum/Nodes: The visualized thyroid is unremarkable. Numerous asymmetrically pathologically enlarged left axillary and subpectoral lymph nodes are identified, measuring up to 17 mm in short axis diameter at axial image # 40/3, slightly progressive since prior examination. Additional shotty mediastinal adenopathy is stable and may be reactive in nature. The esophagus is unremarkable. Lungs/Pleura: There is diffuse ground-glass opacity again identified throughout the lungs most suggestive of mild alveolar pulmonary edema. Small right pleural effusion is unchanged. Several  pulmonary nodules have developed in the interval since prior examination distributed randomly within the lungs measuring up to 11 mm within the right upper lobe and within the left lower lobe, likely inflammatory given its relatively rapid development since prior examination. No pneumothorax. Central airways are widely patent. Upper Abdomen: The visualized kidneys appear atrophic. Cystic lesion within the tail the pancreas measures 19 mm in dimension, stable since prior examination, not well characterized on this examination. No acute abnormality. Musculoskeletal: No acute bone abnormality. No lytic or blastic bone lesion. IMPRESSION: 1. 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 This is not optimally evaluated on this examination as the right subclavian vein is occluded centrally and the injection was made through the right upper extremity. This appears unchanged, however, since prior examination of 06/17/2022 where a complete occlusion of the superior vena cava was identified spanning roughly 1 cm above the and suggests arch. This would be better assessed, particularly if there is intention for potential recanalization, with formal venography through the left upper extremity. 2. Extensive multi-vessel coronary artery calcification. 3. Mild global cardiomegaly. Stable small pericardial effusion. 4. Stable small right pleural effusion. 5. Interval development of multiple randomly distributed pulmonary nodules measuring up to 11 mm, likely inflammatory given its relatively rapid development since prior examination. 6. Progressive left axillary adenopathy. This is asymmetric with the shotty adenopathy within the mediastinum which may be reactive in nature and may represent reactive  adenopathy related to an inflammatory process within the left chest wall or left upper extremity or pathologic adenopathy. Correlation with recent mammography and clinical breast examination is  recommended. 7. Stable 19 mm cystic lesion within the tail the pancreas, not well characterized on this examination. This could be better assessed with MRI examination, if indicated. Electronically Signed   By: Fidela Salisbury M.D.   On: 07/13/2022 21:33   Medications:  vancomycin Stopped (07/13/22 1803)    calcitRIOL  2.5 mcg Oral Q M,W,F-HD   carvedilol  25 mg Oral BID WC   Chlorhexidine Gluconate Cloth  6 each Topical Q0600   Chlorhexidine Gluconate Cloth  6 each Topical Q0600   Chlorhexidine Gluconate Cloth  6 each Topical Q0600   cinacalcet  90 mg Oral Q M,W,F-HD   darbepoetin (ARANESP) injection - DIALYSIS  200 mcg Intravenous Q Fri-HD   lanthanum  1,000 mg Oral TID WC   multivitamin  1 tablet Oral QHS   mupirocin ointment  1 Application Nasal BID   pantoprazole  40 mg Oral Daily   polyethylene glycol  17 g Oral Daily   sodium chloride flush  10-40 mL Intracatheter Q12H    Assessment/Recommendations:    ESRD:  -outpatient orders: [facility].  4 hours 15 minutes.  MWF.  EDW 102 kg.  Revaclear 300.  400/500.  2K, 2.5 Cal.  Meds: Calcitriol 2.5 mcg 3 times weekly, Sensipar 90 mg 3 times weekly, Mircera 200 mg every 2 weeks (last dose on Monday 7/10).  No heparin -will continue with HD on MWF schedule. HD today via rt fem temp line (placed 7/17 in OR), next HD planned for today after [facility] placement -plan for femoral TDC today w/ VVS (has SVC occlusion)   Sepsis, +MRSA bacteremia -seen by ID- vanc w/ HD (last day 08/17/22); bcx pos on 7/14 -avoid placing TDC , has temp HD catheter until cultures clear -intraop gram stain+culture sent 7/17-few MRSA. Bcx 7/16 NGTD. Planning for [facility] on Friday -TEE 7/20 with no endocarditis  Brief bradycardic arrest 07/11/22 -intraoperative: possible related to  adverse effect of neosynephrine and fentanyl. Extubated in pacu. Now stable/out of ICU   Abdominal pain, early/partial SBO -mgmt per primary service, appears improved   Volume/ hypertension: EDW 102kg. Will UF as tolerated    Anemia of Chronic Kidney Disease, acute anemia: required transfusions, cont to monitor -FOBT negative, transfused for Hgb <7 -iron sat 10% but hold IV iron in setting of bacteremia -last dose of mircera per outpatient unit was 7/10, restarting ESA on 7/21   Secondary Hyperparathyroidism/Hyperphosphatemia: on calcitriol and sensipar, will start renvela if po4 trends up. 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  Status post right femoral temp HD line on 7/17 in the OR. Femoral TDC planned for today especially since repeat cultures have been negative and given that she has SVC occlusion. Appreciate VVS assistance

## 2022-07-15 NOTE — H&P (View-Only) (Signed)
Patient ate breakfast today. TDC rescheduled for Monday.   Rebekah Burton. Stanford Breed, MD [facility] Phone Number: [phone] 07/15/2022 11:36 AM

## 2022-07-15 NOTE — Progress Notes (Signed)
Patient ate breakfast today. TDC rescheduled for Monday.   Rebekah Burton. Stanford Breed, MD [facility] Phone Number: [phone] 07/15/2022 11:36 AM

## 2022-07-15 NOTE — Progress Notes (Signed)
Made aware by 3E that pt had a full breakfast this morning, Dr. Glennon Mac made aware. 3E RN will make pt NPO as of now. There were no NPO orders last night.

## 2022-07-15 NOTE — Progress Notes (Signed)
[facility]  PROGRESS NOTE  Rebekah Burton JQB:[id number] DOB: Jan 24, 1976 DOA: 07/07/2022 PCP: [name], NP   Brief HPI:   46 year old female with medical history of hypertension, ESRD on HD, MWF, PAD, blindness, bilateral transmetatarsal amputation, tobacco abuse came to ED with complaints of lower abdominal/pelvic pain which started on Tuesday afternoon 7/11.  Patient says that after All[place] on Monday she started to have slow bleed from the fistula site of left arm on the following day, Tuesday she developed abdominal pain which was of moderate intensity and worsened with movement.  She saw her PCP who prescribed antibiotic.  Patient states that she missed dialysis on Wednesday due to severity of abdominal pain. Work-up in the hospital revealed symptomatic anemia with acute blood loss anemia from fistula ulcer, sepsis secondary to possible left-sided pyonephritis and MRSA bacteremia.  Patient underwent excision of infected left arm AV fistula and stent graft material with Dr. Stanford Breed on 7/17.  Patient suffered intraoperative cardiac arrest after placement of percutaneous temporary catheter.  ROSC achieved.  Patient completed procedure in the OR then was extubated in PACU.  She was monitored in the ICU on 7/17.   Subjective       Assessment/Plan:     Symptomatic anemia -Acute blood loss anemia from his fistula ulcers -She has history of anemia, disease -S/p excision of infected left arm AV fistula and stent graft on 7/17 -S/p units PRBC on 7/14 -Received 2 units of PRBC yesterday for hemoglobin of 5.9 -This morning hemoglobin is 8.2  Sepsis secondary to possible left-sided pyelonephritis/MRSA bacteremia -Sepsis POA -Blood cultures positive for MRSA, started on vancomycin -Tissue culture from AV fistula abscess grew Staph aureus -Repeat blood cultures obtained on 7/16 is currently pending -ID following -TEE done today shows echodense mobile mass on mitral annulus  likely calcification -ID recommends to continue with IV vancomycin for 6 weeks till 08/17/2022  PEA arrest -Developed PEA arrest intraoperative on 7/17 -Patient was extubated in PACU and monitored in the ICU -Transferred to progressive bed  ESRD on hemodialysis, MWF -Vascular surgery planning to do femoral [facility] today -Nephrology following  ?  SVC occlusion -Seen on CT chest on 06/17/2022 -She was seen by vascular surgery -Venous duplex of right upper extremity on 6/24 showed indeterminate DVT in the right radial vein.  Patient was started on Eliquis -Patient was discharged on Eliquis which is currently on hold -  Prolonged QT interval -QTc 502 ms -Avoid QT prolonging drugs   Pancreatic cyst -CT abdomen and pelvis showed cystic lesion of the tail of the pancreas measuring 17 mm -Nonemergent contrast-enhanced MRCP was recommended   Essential hypertension -Blood pressure is now elevated; will restart Coreg -Continue  hydralazine as needed  GERD -Protonix   Tobacco use disorder -Patient counseled on tobacco abuse cessation   Coronary calcification -Recent CTA chest reviewed with findings of 4 vessel coronary calcification -Ordered and reviewed 2d echo, normal LVEF with no WMA     Medications     calcitRIOL  2.5 mcg Oral Q M,W,F-HD   carvedilol  25 mg Oral BID WC   Chlorhexidine Gluconate Cloth  6 each Topical Q0600   Chlorhexidine Gluconate Cloth  6 each Topical Q0600   Chlorhexidine Gluconate Cloth  6 each Topical Q0600   cinacalcet  90 mg Oral Q M,W,F-HD   darbepoetin (ARANESP) injection - DIALYSIS  200 mcg Intravenous Q Fri-HD   lanthanum  1,000 mg Oral TID WC   multivitamin  1 tablet Oral QHS  mupirocin ointment  1 Application Nasal BID   pantoprazole  40 mg Oral Daily   polyethylene glycol  17 g Oral Daily   sodium chloride flush  10-40 mL Intracatheter Q12H     Data Reviewed:   CBG:  Recent Labs  Lab 07/11/22 1520 07/11/22 2306 07/12/22 0751  07/12/22 1153 07/12/22 1505  GLUCAP 160* 122* 138* 117* 115*    SpO2: 92 % O2 Flow Rate (L/min): 2 L/min    Vitals:   07/14/22 1547 07/14/22 1923 07/15/22 0002 07/15/22 0410  BP: (!) 152/104 (!) 187/114 (!) 147/104 (!) 157/117  Pulse: 84  96 99  Resp: 20 18 16 15   Temp: 97.8 F (36.6 C) 98.5 F (36.9 C) 98.5 F (36.9 C) 99 F (37.2 C)  TempSrc: Oral Oral Oral Oral  SpO2: 96% 97% 94% 92%  Weight:    97 kg  Height:          Data Reviewed:  Basic Metabolic Panel: Recent Labs  Lab 07/08/22 0936 07/09/22 0521 07/10/22 1025 07/11/22 0329 07/11/22 0920 07/11/22 1145 07/12/22 0317 07/13/22 0400 07/14/22 0540 07/15/22 0410  NA 136   < > 136 136   < > 135 135 134* 134* 131*  K 3.9   < > 3.7 3.6   < > 3.8 3.3* 3.6 3.7 4.6  CL 96*   < > 95* 94*  --  91* 93* 96* 94* 92*  CO2 27   < > 26 25  --  24 29 28 27 23   GLUCOSE 136*   < > 129* 123*  --  188* 130* 109* 110* 128*  BUN 44*   < > 37* 45*  --  51* 30* 41* 48* 57*  CREATININE 8.20*   < > 6.86* 7.96*  --  8.39* 5.32* 6.61* 6.92* 8.51*  CALCIUM 8.9   < > 9.6 9.7  --  9.8 9.1 8.9 9.3 9.9  MG 1.9  --   --   --   --  1.8 1.7  --   --   --   PHOS 5.2*  --  4.2 4.0  --   --  3.6  --   --   --    < > = values in this interval not displayed.    CBC: Recent Labs  Lab 07/11/22 0329 07/11/22 0920 07/12/22 0317 07/12/22 1144 07/13/22 0400 07/14/22 0540 07/15/22 0410  WBC 10.5  --  11.8*  --  12.0* 11.9* 12.4*  HGB 7.0*   < > 5.9* 7.8* 7.8* 7.9* 8.2*  HCT 20.9*   < > 18.5* 23.7* 23.4* 24.4* 24.4*  MCV 86.7  --  88.1  --  88.3 89.7 87.5  PLT 126*  --  160  --  207 233 259   < > = values in this interval not displayed.    LFT Recent Labs  Lab 07/08/22 0936 07/09/22 0521 07/10/22 0221 07/10/22 1025 07/11/22 0329 07/14/22 0540  AST 17 17 24   --   --  42*  ALT 18 18 20   --   --  50*  ALKPHOS 55 62 76  --   --  103  BILITOT 1.4* 2.7* 3.6*  --   --  1.9*  PROT 5.9* 6.2* 6.1*  --   --  6.4*  ALBUMIN 2.8* 2.9*  2.8* 2.8* 2.8* 2.8*     Antibiotics: Anti-infectives (From admission, onward)    Start     Dose/Rate Route Frequency Ordered Stop  07/13/22 1200  vancomycin (VANCOCIN) IVPB 1000 mg/200 mL premix        1,000 mg 200 mL/hr over 60 Minutes Intravenous Every M-W-F (Hemodialysis) 07/12/22 0757 08/17/22 2359   07/11/22 1258  vancomycin variable dose per unstable renal function (pharmacist dosing)  Status:  Discontinued         Does not apply See admin instructions 07/11/22 1258 07/12/22 0757   07/11/22 1200  vancomycin (VANCOREADY) IVPB 750 mg/150 mL  Status:  Discontinued        750 mg 150 mL/hr over 60 Minutes Intravenous Every M-W-F (Hemodialysis) 07/10/22 1246 07/11/22 1252   07/11/22 0745  vancomycin (VANCOCIN) IVPB 1000 mg/200 mL premix       Note to Pharmacy: On call to OR for 07:30 start   1,000 mg 200 mL/hr over 60 Minutes Intravenous  Once 07/11/22 0736 07/11/22 0757   07/11/22 0734  vancomycin (VANCOCIN) 1-5 GM/200ML-% IVPB       Note to Pharmacy: Maude Leriche: cabinet override      07/11/22 0734 07/11/22 0806   07/11/22 0730  vancomycin (VANCOCIN) 1,000 mg in sodium chloride 0.9 % 250 mL IVPB  Status:  Discontinued       Note to Pharmacy: On call to OR for 07:30 start   1,000 mg 250 mL/hr over 60 Minutes Intravenous  Once 07/11/22 0728 07/11/22 0733   07/10/22 1345  vancomycin (VANCOREADY) IVPB 750 mg/150 mL        750 mg 150 mL/hr over 60 Minutes Intravenous  Once 07/10/22 1246 07/10/22 1453   07/09/22 0915  vancomycin (VANCOREADY) IVPB 1500 mg/300 mL        1,500 mg 150 mL/hr over 120 Minutes Intravenous  Once 07/09/22 0820 07/09/22 1146   07/08/22 0600  cefTRIAXone (ROCEPHIN) 1 g in sodium chloride 0.9 % 100 mL IVPB  Status:  Discontinued        1 g 200 mL/hr over 30 Minutes Intravenous Every 24 hours 07/08/22 0512 07/10/22 1557        DVT prophylaxis: SCDs  Code Status: Full code  Family Communication: No family at bedside   CONSULTS  nephrology   Objective    Physical Examination:  General-appears in no acute distress Heart-S1-S2, regular, no murmur auscultated Lungs-clear to auscultation bilaterally, no wheezing or crackles auscultated Abdomen-soft, nontender, no organomegaly Extremities-no edema in the lower extremities Neuro-alert, oriented x3, no focal deficit noted   Status is: Inpatient: Patient on IV vancomycin;         [place]   [facility] If 7PM-7AM, please contact night-coverage at [email or web address], Office  [phone]   07/15/2022, 9:12 AM  LOS: 7 days

## 2022-07-16 DIAGNOSIS — I480 Paroxysmal atrial fibrillation: Secondary | ICD-10-CM

## 2022-07-16 DIAGNOSIS — J810 Acute pulmonary edema: Secondary | ICD-10-CM

## 2022-07-16 LAB — AEROBIC/ANAEROBIC CULTURE W GRAM STAIN (SURGICAL/DEEP WOUND)

## 2022-07-16 MED ORDER — HEPARIN SODIUM (PORCINE) 1000 UNIT/ML IJ SOLN
INTRAMUSCULAR | Status: AC
  Filled 2022-07-16: qty 4

## 2022-07-16 MED ORDER — CLONIDINE HCL 0.1 MG PO TABS
0.1000 mg | ORAL_TABLET | Freq: Three times a day (TID) | ORAL | Status: DC
  Administered 2022-07-16 – 2022-07-17 (×3): 0.1 mg via ORAL
  Filled 2022-07-16 (×3): qty 1

## 2022-07-16 MED ORDER — HEPARIN (PORCINE) 25000 UT/250ML-% IV SOLN
1400.0000 [IU]/h | INTRAVENOUS | Status: DC
  Administered 2022-07-16: 1100 [IU]/h via INTRAVENOUS
  Administered 2022-07-18: 1400 [IU]/h via INTRAVENOUS
  Filled 2022-07-16 (×3): qty 250

## 2022-07-16 MED ORDER — DILTIAZEM HCL-DEXTROSE 125-5 MG/125ML-% IV SOLN (PREMIX)
5.0000 mg/h | INTRAVENOUS | Status: DC
  Administered 2022-07-16: 5 mg/h via INTRAVENOUS
  Administered 2022-07-17: 10 mg/h via INTRAVENOUS
  Filled 2022-07-16 (×2): qty 125

## 2022-07-16 NOTE — Progress Notes (Signed)
[facility] Progress Note   Subjective:  no acute events. Tdc delayed to Monday given that patient ate yesterday. Hence HD pushed to today. No complaints  Objective Vitals:   07/15/22 0803 07/15/22 1942 07/16/22 0021 07/16/22 0502  BP: (!) 175/113 (!) 184/112 (!) 163/104 (!) 176/111  Pulse:  87 89 91  Resp: 17  12 20   Temp:  98.2 F (36.8 C) 98.4 F (36.9 C) 97.9 F (36.6 C)  TempSrc:  Oral Oral Oral  SpO2:  93% 94% 100%  Weight:    97.1 kg  Height:       Physical Exam General: chronically ill but nontoxic appearing, laying flat in bed Heart: RRR Lungs:clear ant Abdomen: soft, obese Extremities:no edema Dialysis Access: lue avf dressing in place, rt fem temp HD catheter  Additional Objective Labs: Basic Metabolic Panel: Recent Labs  Lab 07/10/22 1025 07/11/22 0329 07/11/22 0920 07/12/22 0317 07/13/22 0400 07/14/22 0540 07/15/22 0410  NA 136 136   < > 135 134* 134* 131*  K 3.7 3.6   < > 3.3* 3.6 3.7 4.6  CL 95* 94*   < > 93* 96* 94* 92*  CO2 26 25   < > 29 28 27 23   GLUCOSE 129* 123*   < > 130* 109* 110* 128*  BUN 37* 45*   < > 30* 41* 48* 57*  CREATININE 6.86* 7.96*   < > 5.32* 6.61* 6.92* 8.51*  CALCIUM 9.6 9.7   < > 9.1 8.9 9.3 9.9  PHOS 4.2 4.0  --  3.6  --   --   --    < > = values in this interval not displayed.   Liver Function Tests: Recent Labs  Lab 07/10/22 0221 07/10/22 1025 07/11/22 0329 07/14/22 0540  AST 24  --   --  42*  ALT 20  --   --  50*  ALKPHOS 76  --   --  103  BILITOT 3.6*  --   --  1.9*  PROT 6.1*  --   --  6.4*  ALBUMIN 2.8* 2.8* 2.8* 2.8*   No results for input(s): "LIPASE", "AMYLASE" in the last 168 hours.  CBC: Recent Labs  Lab 07/11/22 0329 07/11/22 0920 07/12/22 0317 07/12/22 1144 07/13/22 0400 07/14/22 0540 07/15/22 0410  WBC 10.5  --  11.8*  --  12.0* 11.9* 12.4*  HGB 7.0*   < > 5.9*   < > 7.8* 7.9* 8.2*  HCT 20.9*   < > 18.5*   < > 23.4* 24.4* 24.4*  MCV 86.7  --  88.1  --  88.3 89.7 87.5   PLT 126*  --  160  --  207 233 259   < > = values in this interval not displayed.   Blood Culture    Component Value Date/Time   SDES TISSUE ABSCESS 07/11/2022 0846   SDES ABSCESS 07/11/2022 0846   SPECREQUEST LEFT ARTERIOVENOUS FISTULA ABSC 07/11/2022 0846   SPECREQUEST LEFT ARTERIOVENOUS FISTULA ABSC 07/11/2022 0846   CULT  07/11/2022 0846    FEW METHICILLIN RESISTANT STAPHYLOCOCCUS AUREUS NO ANAEROBES ISOLATED; CULTURE IN PROGRESS FOR 5 DAYS    CULT  07/11/2022 0846    FEW METHICILLIN RESISTANT STAPHYLOCOCCUS AUREUS NO ANAEROBES ISOLATED; CULTURE IN PROGRESS FOR 5 DAYS    REPTSTATUS PENDING 07/11/2022 0846   REPTSTATUS PENDING 07/11/2022 0846    Cardiac Enzymes: No results for input(s): "CKTOTAL", "CKMB", "CKMBINDEX", "TROPONINI" in the last 168 hours. CBG: Recent Labs  Lab 07/11/22 1520  07/11/22 2306 07/12/22 0751 07/12/22 1153 07/12/22 1505  GLUCAP 160* 122* 138* 117* 115*   Iron Studies:  No results for input(s): "IRON", "TIBC", "TRANSFERRIN", "FERRITIN" in the last 72 hours.  @[email or web address] @ Studies/Results: ECHO TEE  Result Date: 07/14/2022    TRANSESOPHOGEAL ECHO REPORT   Patient Name:   QUIANA COBAUGH Date of Exam: 07/14/2022 Medical Rec #:  [id number]    Height:       64.0 in Accession #:    [id number]   Weight:       214.5 lb Date of Birth:  1976-10-22    BSA:          2.015 m Patient Age:    46 years     BP:           144/93 mmHg Patient Gender: F            HR:           91 bpm. Exam Location:  Inpatient Procedure: Transesophageal Echo, 3D Echo, Color Doppler and Cardiac Doppler Indications:     Bacteremia  History:         Patient has prior history of Echocardiogram examinations, most                  recent 07/08/2022. Risk Factors:Hypertension. SVC Occlusion.  Sonographer:     Raquel Sarna Senior Referring Phys:  [id number] Margie Billet Diagnosing Phys: Berniece Salines DO PROCEDURE: After discussion of the risks and benefits of a TEE, an informed consent was obtained from the  patient. The transesophogeal probe was passed without difficulty through the esophogus of the patient. Local oropharyngeal anesthetic was provided with Cetacaine. Sedation performed by different physician. The patient was monitored while under deep sedation. Anesthestetic sedation was provided intravenously by Anesthesiology: 340mg  of Propofol. The patient's vital signs; including heart rate, blood  pressure, and oxygen saturation; remained stable throughout the procedure. The patient developed no complications during the procedure. IMPRESSIONS  1. Left ventricular ejection fraction, by estimation, is 60 to 65%. The left ventricle has normal function. The left ventricle has no regional wall motion abnormalities.  2. Right ventricular systolic function is normal. The right ventricular size is normal. There is severely elevated pulmonary artery systolic pressure.  3. No left atrial/left atrial appendage thrombus was detected.  4. Mobile echodensity at the posterior mitral annulus with severe mitral annular calcification, suspect that this mobile echodensity is an extending of the calcification. Can not rule out a vegetation. The mitral valve is degenerative. Moderate to severe mitral valve regurgitation. Mild functional mitral stenosis. Mean gradient 3 mmHg at heart rate 90 bpm and blood pressure 134/92 mmHg. Severe mitral annular calcification.  5. Tricuspid valve regurgitation is moderate to severe.  6. The aortic valve is normal in structure. Aortic valve regurgitation is not visualized. No aortic stenosis is present.  7. There is a linear echodense mass in the SVC which does not appear to be a catheter, the SVC appears to be stenosed at the SVC-RA junction. The inferior vena cava is normal in size with greater than 50% respiratory variability, suggesting right atrial  pressure of 3 mmHg.  8. Moderate pericardial effusion. The pericardial effusion is anterior to the right ventricle. There is no evidence of cardiac  tamponade.  9. There is Moderate (Grade III) layered and protruding plaque involving the descending aorta and ascending aorta. Conclusion(s)/Recommendation(s): Normal biventricular function without evidence of hemodynamically significant valvular heart disease. FINDINGS  Left Ventricle: Left ventricular ejection fraction, by estimation,  is 60 to 65%. The left ventricle has normal function. The left ventricle has no regional wall motion abnormalities. The left ventricular internal cavity size was normal in size. There is  no left ventricular hypertrophy. Right Ventricle: The right ventricular size is normal. No increase in right ventricular wall thickness. Right ventricular systolic function is normal. There is severely elevated pulmonary artery systolic pressure. Left Atrium: Left atrial size was normal in size. No left atrial/left atrial appendage thrombus was detected. Right Atrium: Right atrial size was normal in size. Pericardium: A moderately sized pericardial effusion is present. The pericardial effusion is anterior to the right ventricle. There is no evidence of cardiac tamponade. Mitral Valve: Mobile echodensity at the posterior mitral annulus with severe mitral annular calcification, suspect that this mobile echodensity is an extending of the calcification. In this clinical picture can not rule out a vegetation. The mitral valve  is degenerative in appearance. Severe mitral annular calcification. Moderate to severe mitral valve regurgitation. Mild functional mitral valve stenosis. Mean gradient 83mmHg at heart rate 90 bpm and blood pressure 134/92 mmHg. MV peak gradient, 5.4 mmHg. The mean mitral valve gradient is 3.0 mmHg. Tricuspid Valve: The tricuspid valve is normal in structure. Tricuspid valve regurgitation is moderate to severe. No evidence of tricuspid stenosis. Aortic Valve: The aortic valve is normal in structure. Aortic valve regurgitation is not visualized. No aortic stenosis is present. Pulmonic  Valve: The pulmonic valve was normal in structure. Pulmonic valve regurgitation is trivial. No evidence of pulmonic stenosis. Aorta: The aortic root is normal in size and structure. There is moderate (Grade III) layered and protruding plaque involving the descending aorta and ascending aorta. Pulmonary Artery: The pulmonary artery is not well seen. Venous: The left upper pulmonary vein, left lower pulmonary vein, right upper pulmonary vein and right lower pulmonary vein are normal. A pattern of systolic flow reversal, suggestive of severe mitral regurgitation is recorded from the left upper pulmonary vein. There is a linear echodense mass in the SVC which does not appear to be a catheter, the SVC appears to be stenosed at the SVC-RA junction. The inferior vena cava is normal in size with greater than 50% respiratory variability, suggesting right atrial pressure of 3 mmHg. IAS/Shunts: No atrial level shunt detected by color flow Doppler.  MITRAL VALVE                  TRICUSPID VALVE MV Peak grad: 5.4 mmHg        TR Peak grad:   64.0 mmHg MV Mean grad: 3.0 mmHg        TR Vmax:        400.00 cm/s MV Vmax:      1.16 m/s MV Vmean:     85.3 cm/s MR Peak grad:    108.4 mmHg MR Mean grad:    67.5 mmHg MR Vmax:         520.50 cm/s MR Vmean:        384.0 cm/s MR PISA:         3.08 cm MR PISA Eff ROA: 18 mm MR PISA Radius:  0.70 cm Kardie Tobb DO Electronically signed by Berniece Salines DO Signature Date/Time: 07/14/2022/5:57:40 PM    Final    Medications:  vancomycin Stopped (07/13/22 1803)   vancomycin      calcitRIOL  2.5 mcg Oral Q M,W,F-HD   carvedilol  25 mg Oral BID WC   Chlorhexidine Gluconate Cloth  6 each Topical Q0600   Chlorhexidine  Gluconate Cloth  6 each Topical Q0600   cinacalcet  90 mg Oral Q M,W,F-HD   darbepoetin (ARANESP) injection - DIALYSIS  200 mcg Intravenous Q Fri-HD   darbepoetin (ARANESP) injection - DIALYSIS  200 mcg Intravenous Q Sat-HD   lanthanum  1,000 mg Oral TID WC   multivitamin  1  tablet Oral QHS   mupirocin ointment  1 Application Nasal BID   pantoprazole  40 mg Oral Daily   polyethylene glycol  17 g Oral Daily   sodium chloride flush  10-40 mL Intracatheter Q12H    Assessment/Recommendations:    ESRD:  -outpatient orders: [facility].  4 hours 15 minutes.  MWF.  EDW 102 kg.  Revaclear 300.  400/500.  2K, 2.5 Cal.  Meds: Calcitriol 2.5 mcg 3 times weekly, Sensipar 90 mg 3 times weekly, Mircera 200 mg every 2 weeks (last dose on Monday 7/10).  No heparin -will continue with HD on MWF schedule after today. HD today via rt fem temp line (placed 7/17 in OR)-plan for femoral [facility] Monday w/ VVS (has SVC occlusion)   Sepsis, +MRSA bacteremia -seen by ID- vanc w/ HD (last day 08/17/22); bcx pos on 7/14 -avoid placing TDC , has temp HD catheter until cultures clear -intraop gram stain+culture sent 7/17-few MRSA. Bcx 7/16 NGTD. Planning for [facility] on Monday -TEE 7/20 with no endocarditis  Brief bradycardic arrest 07/11/22 -intraoperative: possible related to adverse effect of neosynephrine and fentanyl. Extubated in pacu. Now stable/out of ICU   Abdominal pain, early/partial SBO -mgmt per primary service, appears improved   Volume/ hypertension: EDW 102kg. Will UF as tolerated    Anemia of Chronic Kidney Disease, acute anemia: required transfusions, cont to monitor -FOBT negative, transfused for Hgb <7 -iron sat 10% but hold IV iron in setting of bacteremia -last dose of mircera per outpatient unit was 7/10, restarting ESA on 7/21   Secondary Hyperparathyroidism/Hyperphosphatemia: on calcitriol and sensipar, will start renvela if po4 trends up. 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  Status post right femoral temp HD line on 7/17 in the OR. Femoral TDC planned for Monday especially since repeat cultures have been negative and given that she has SVC occlusion. Appreciate VVS assistance

## 2022-07-16 NOTE — Progress Notes (Addendum)
[facility]  PROGRESS NOTE  Rebekah Burton JAS:[id number] DOB: 1976/05/23 DOA: 07/07/2022 PCP: [name], NP   Brief HPI:   46 year old female with medical history of hypertension, ESRD on HD, MWF, PAD, blindness, bilateral transmetatarsal amputation, tobacco abuse came to ED with complaints of lower abdominal/pelvic pain which started on Tuesday afternoon 7/11.  Patient says that after Allises on Monday she started to have slow bleed from the fistula site of left arm on the following day, Tuesday she developed abdominal pain which was of moderate intensity and worsened with movement.  She saw her PCP who prescribed antibiotic.  Patient states that she missed dialysis on Wednesday due to severity of abdominal pain. Work-up in the hospital revealed symptomatic anemia with acute blood loss anemia from fistula ulcer, sepsis secondary to possible left-sided pyonephritis and MRSA bacteremia.  Patient underwent excision of infected left arm AV fistula and stent graft material with Dr. Stanford Breed on 7/17.  Patient suffered intraoperative cardiac arrest after placement of percutaneous temporary catheter.  ROSC achieved.  Patient completed procedure in the OR then was extubated in PACU.  She was monitored in the ICU on 7/17.   Subjective   Patient seen and examined, denies any complaints.    Assessment/Plan:     Symptomatic anemia -Acute blood loss anemia from his fistula ulcers -She has history of anemia, disease -S/p excision of infected left arm AV fistula and stent graft on 7/17 -S/p units PRBC on 7/14 -Received 2 units of PRBC yesterday for hemoglobin of 5.9 -This morning hemoglobin is 8.2  New onset atrial fibrillation -Patient went into A-fib this morning with heart rate 140s -We will start Cardizem gtt. -Patient was on Eliquis at home for DVT which was held due to bleeding from fistula ulcers -We will start patient on heparin per pharmacy -We will check TSH; echocardiogram  from 07/14/2022 showed EF of 65%   Sepsis secondary to possible left-sided pyelonephritis/MRSA bacteremia -Sepsis POA -Blood cultures positive for MRSA, started on vancomycin -Tissue culture from AV fistula abscess grew Staph aureus -Repeat blood cultures obtained on 7/16 is currently pending -ID following -TEE done today shows echodense mobile mass on mitral annulus likely calcification -ID recommends to continue with IV vancomycin for 6 weeks till 08/17/2022  PEA arrest -Developed PEA arrest intraoperative on 7/17 -Patient was extubated in PACU and monitored in the ICU -Transferred to progressive bed  ESRD on hemodialysis, MWF -Vascular surgery planning to do femoral [facility] today -Nephrology following  ?  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  Patient was started on Eliquis -Patient was discharged on Eliquis which is currently on hold -Started on heparin as above   Prolonged QT interval -QTc 502 ms -Avoid QT prolonging drugs   Pancreatic cyst -CT abdomen and pelvis showed cystic lesion of the tail of the pancreas measuring 17 mm -Nonemergent contrast-enhanced MRCP was recommended   Essential hypertension -Blood pressure was elevated, Coreg was restarted -Continue  hydralazine as needed -Patient also takes clonidine 0.3 mg 3 times daily at home -We will restart clonidine at low-dose of 0.1 mg p.o. 3 times daily  GERD -Protonix   Tobacco use disorder -Patient counseled on tobacco abuse cessation   Coronary calcification -Recent CTA chest reviewed with findings of 4 vessel coronary calcification -Ordered and reviewed 2d echo, normal LVEF with no  WMA     Medications     calcitRIOL  2.5 mcg Oral Q M,W,F-HD   carvedilol  25 mg Oral BID WC   Chlorhexidine Gluconate Cloth  6 each Topical  Q0600   Chlorhexidine Gluconate Cloth  6 each Topical Q0600   cinacalcet  90 mg Oral Q M,W,F-HD   cloNIDine  0.1 mg Oral TID   darbepoetin (ARANESP) injection - DIALYSIS  200 mcg Intravenous Q Fri-HD   darbepoetin (ARANESP) injection - DIALYSIS  200 mcg Intravenous Q Sat-HD   lanthanum  1,000 mg Oral TID WC   multivitamin  1 tablet Oral QHS   pantoprazole  40 mg Oral Daily   polyethylene glycol  17 g Oral Daily   sodium chloride flush  10-40 mL Intracatheter Q12H     Data Reviewed:   CBG:  Recent Labs  Lab 07/11/22 1520 07/11/22 2306 07/12/22 0751 07/12/22 1153 07/12/22 1505  GLUCAP 160* 122* 138* 117* 115*    SpO2: 100 % O2 Flow Rate (L/min): 2 L/min    Vitals:   07/16/22 0021 07/16/22 0502 07/16/22 0855 07/16/22 1120  BP: (!) 163/104 (!) 176/111 (!) 193/123 (!) 128/113  Pulse: 89 91    Resp: 12 20    Temp: 98.4 F (36.9 C) 97.9 F (36.6 C) 98.1 F (36.7 C)   TempSrc: Oral Oral Oral   SpO2: 94% 100%    Weight:  97.1 kg    Height:          Data Reviewed:  Basic Metabolic Panel: Recent Labs  Lab 07/10/22 1025 07/11/22 0329 07/11/22 0920 07/11/22 1145 07/12/22 0317 07/13/22 0400 07/14/22 0540 07/15/22 0410  NA 136 136   < > 135 135 134* 134* 131*  K 3.7 3.6   < > 3.8 3.3* 3.6 3.7 4.6  CL 95* 94*  --  91* 93* 96* 94* 92*  CO2 26 25  --  24 29 28 27 23   GLUCOSE 129* 123*  --  188* 130* 109* 110* 128*  BUN 37* 45*  --  51* 30* 41* 48* 57*  CREATININE 6.86* 7.96*  --  8.39* 5.32* 6.61* 6.92* 8.51*  CALCIUM 9.6 9.7  --  9.8 9.1 8.9 9.3 9.9  MG  --   --   --  1.8 1.7  --   --   --   PHOS 4.2 4.0  --   --  3.6  --   --   --    < > = values in this interval not displayed.    CBC: Recent Labs  Lab 07/11/22 0329 07/11/22 0920 07/12/22 0317 07/12/22 1144 07/13/22 0400 07/14/22 0540 07/15/22 0410  WBC 10.5  --  11.8*  --  12.0* 11.9* 12.4*  HGB 7.0*   < > 5.9* 7.8* 7.8* 7.9* 8.2*  HCT 20.9*   < > 18.5* 23.7* 23.4* 24.4* 24.4*  MCV 86.7  --   88.1  --  88.3 89.7 87.5  PLT 126*  --  160  --  207 233 259   < > = values in this interval not displayed.    LFT Recent Labs  Lab 07/10/22 0221 07/10/22 1025 07/11/22 0329 07/14/22 0540  AST 24  --   --  42*  ALT 20  --   --  50*  ALKPHOS 76  --   --  103  BILITOT 3.6*  --   --  1.9*  PROT 6.1*  --   --  6.4*  ALBUMIN  2.8* 2.8* 2.8* 2.8*     Antibiotics: Anti-infectives (From admission, onward)    Start     Dose/Rate Route Frequency Ordered Stop   07/16/22 1200  vancomycin (VANCOCIN) IVPB 1000 mg/200 mL premix        1,000 mg 200 mL/hr over 60 Minutes Intravenous Every Sat (Hemodialysis) 07/15/22 1810 07/23/22 1159   07/13/22 1200  vancomycin (VANCOCIN) IVPB 1000 mg/200 mL premix        1,000 mg 200 mL/hr over 60 Minutes Intravenous Every M-W-F (Hemodialysis) 07/12/22 0757 08/17/22 2359   07/11/22 1258  vancomycin variable dose per unstable renal function (pharmacist dosing)  Status:  Discontinued         Does not apply See admin instructions 07/11/22 1258 07/12/22 0757   07/11/22 1200  vancomycin (VANCOREADY) IVPB 750 mg/150 mL  Status:  Discontinued        750 mg 150 mL/hr over 60 Minutes Intravenous Every M-W-F (Hemodialysis) 07/10/22 1246 07/11/22 1252   07/11/22 0745  vancomycin (VANCOCIN) IVPB 1000 mg/200 mL premix       Note to Pharmacy: On call to OR for 07:30 start   1,000 mg 200 mL/hr over 60 Minutes Intravenous  Once 07/11/22 0736 07/11/22 0757   07/11/22 0734  vancomycin (VANCOCIN) 1-5 GM/200ML-% IVPB       Note to Pharmacy: Maude Leriche: cabinet override      07/11/22 0734 07/11/22 0806   07/11/22 0730  vancomycin (VANCOCIN) 1,000 mg in sodium chloride 0.9 % 250 mL IVPB  Status:  Discontinued       Note to Pharmacy: On call to OR for 07:30 start   1,000 mg 250 mL/hr over 60 Minutes Intravenous  Once 07/11/22 0728 07/11/22 0733   07/10/22 1345  vancomycin (VANCOREADY) IVPB 750 mg/150 mL        750 mg 150 mL/hr over 60 Minutes Intravenous  Once  07/10/22 1246 07/10/22 1453   07/09/22 0915  vancomycin (VANCOREADY) IVPB 1500 mg/300 mL        1,500 mg 150 mL/hr over 120 Minutes Intravenous  Once 07/09/22 0820 07/09/22 1146   07/08/22 0600  cefTRIAXone (ROCEPHIN) 1 g in sodium chloride 0.9 % 100 mL IVPB  Status:  Discontinued        1 g 200 mL/hr over 30 Minutes Intravenous Every 24 hours 07/08/22 0512 07/10/22 1557        DVT prophylaxis: SCDs  Code Status: Full code  Family Communication: No family at bedside   CONSULTS nephrology   Objective    Physical Examination:  General-appears in no acute distress Heart-S1-S2, regular, no murmur auscultated Lungs-clear to auscultation bilaterally, no wheezing or crackles auscultated Abdomen-soft, nontender, no organomegaly Extremities-no edema in the lower extremities Neuro-alert, oriented x3, no focal deficit noted    Status is: Inpatient: Patient on IV vancomycin;         [place]   [facility] If 7PM-7AM, please contact night-coverage at [email or web address], Office  [phone]   07/16/2022, 11:27 AM  LOS: 8 days

## 2022-07-16 NOTE — Progress Notes (Signed)
Pt's heart rhythm converted to A-fib with RVR late this morning with rate of 120-250's and SBP was 190's. Notified MD and Cardizem & Heparin drip were ordered. HR went down to 90-100, 1 &1/2 hours after administering cardizem.

## 2022-07-16 NOTE — Progress Notes (Signed)
ANTICOAGULATION CONSULT NOTE - Initial Consult  Pharmacy Consult for IV heparin  Indication: atrial fibrillation  No Known Allergies  Patient Measurements: Height: 5\' 4"  (162.6 cm) Weight: 97.1 kg (214 lb 1.1 oz) IBW/kg (Calculated) : 54.7 Heparin Dosing Weight: 77 kg  Vital Signs: Temp: 98.1 F (36.7 C) (07/22 0855) Temp Source: Oral (07/22 0855) BP: 128/113 (07/22 1120) Pulse Rate: 91 (07/22 0502)  Labs: Recent Labs    07/14/22 0540 07/15/22 0410  HGB 7.9* 8.2*  HCT 24.4* 24.4*  PLT 233 259  LABPROT 14.8  --   INR 1.2  --   CREATININE 6.92* 8.51*    Estimated Creatinine Clearance: 9.6 mL/min (A) (by C-G formula based on SCr of 8.51 mg/dL (H)).   Medical History: Past Medical History:  Diagnosis Date   Anemia    Blind    Blind    ESRD (end stage renal disease) ([place])    Foot ulceration ([place]) 12/2018   History of transmetatarsal amputation of left foot (HCC)    History of transmetatarsal amputation of right foot (HCC)    Hypertension    PAD (peripheral artery disease) (HCC)    Tobacco abuse     Medications:  Infusions:   diltiazem (CARDIZEM) infusion     vancomycin Stopped (07/13/22 1803)   vancomycin     Assessment: 53 YOF with atrial fibrillation. Per med history, not taking apixaban PTA. Recent  bleeding from fistula ulcers requiring transfusion noted. Current hgb 8.2 improved and PLTs wnl.   Goal of Therapy:  Heparin level 0.3-0.7 units/ml Monitor platelets by anticoagulation protocol: Yes   Plan:  No bolus  Start heparin infusion at 1,100 units/hr Heparin level in 8 hours Daily heparin level and CBC while on heparin Monitor for s/sx bleeding Fu plans for HD cath   Eliseo Gum, PharmD PGY1 Pharmacy Resident   07/16/2022  11:45 AM

## 2022-07-17 ENCOUNTER — Encounter (HOSPITAL_COMMUNITY): Payer: Self-pay | Admitting: Cardiology

## 2022-07-17 LAB — CBC
HCT: 23.3 % — ABNORMAL LOW (ref 36.0–46.0)
Hemoglobin: 7.6 g/dL — ABNORMAL LOW (ref 12.0–15.0)
MCH: 28.7 pg (ref 26.0–34.0)
MCHC: 32.6 g/dL (ref 30.0–36.0)
MCV: 87.9 fL (ref 80.0–100.0)
Platelets: 247 10*3/uL (ref 150–400)
RBC: 2.65 MIL/uL — ABNORMAL LOW (ref 3.87–5.11)
RDW: 18.3 % — ABNORMAL HIGH (ref 11.5–15.5)
WBC: 7.9 10*3/uL (ref 4.0–10.5)
nRBC: 0 % (ref 0.0–0.2)

## 2022-07-17 LAB — HEPARIN LEVEL (UNFRACTIONATED)
Heparin Unfractionated: 0.16 IU/mL — ABNORMAL LOW (ref 0.30–0.70)
Heparin Unfractionated: 0.28 IU/mL — ABNORMAL LOW (ref 0.30–0.70)
Heparin Unfractionated: 0.35 IU/mL (ref 0.30–0.70)

## 2022-07-17 LAB — TSH: TSH: 4.573 u[IU]/mL — ABNORMAL HIGH (ref 0.350–4.500)

## 2022-07-17 MED ORDER — CLONIDINE HCL 0.2 MG PO TABS
0.3000 mg | ORAL_TABLET | Freq: Three times a day (TID) | ORAL | Status: DC
  Administered 2022-07-17 – 2022-07-19 (×5): 0.3 mg via ORAL
  Filled 2022-07-17 (×6): qty 1

## 2022-07-17 MED ORDER — ACETAMINOPHEN 500 MG PO TABS
1000.0000 mg | ORAL_TABLET | Freq: Once | ORAL | Status: AC
Start: 2022-07-18 — End: 2022-07-18
  Administered 2022-07-18: 1000 mg via ORAL
  Filled 2022-07-17: qty 2

## 2022-07-17 NOTE — Anesthesia Preprocedure Evaluation (Addendum)
Anesthesia Evaluation  Patient identified by MRN, date of birth, ID band Patient awake    Reviewed: Allergy & Precautions, NPO status , Patient's Chart, lab work & pertinent test results, reviewed documented beta blocker date and time   History of Anesthesia Complications Negative for: history of anesthetic complications  Airway Mallampati: I  TM Distance: >3 FB Neck ROM: Full    Dental  (+) Dental Advisory Given   Pulmonary Current Smoker and Patient abstained from smoking., former smoker,    breath sounds clear to auscultation       Cardiovascular hypertension, Pt. on medications and Pt. on home beta blockers pulmonary hypertension+ Peripheral Vascular Disease   Rhythm:Regular Rate:Normal  07/14/2022 TEE: EF 60-65%, normal LVF, normal RVF, severe Pulm HTN, mod-severe MR, mod-severe TR, possible veg MV, SVC-RA narrowing   Neuro/Psych negative neurological ROS     GI/Hepatic Neg liver ROS, GERD  Controlled,  Endo/Other  Morbid obesity  Renal/GU Dialysis and ESRFRenal disease     Musculoskeletal   Abdominal (+) + obese,   Peds  Hematology  (+) Blood dyscrasia (Hb 7.6), anemia , eliquis   Anesthesia Other Findings   Reproductive/Obstetrics                           Anesthesia Physical Anesthesia Plan  ASA: 3  Anesthesia Plan: MAC   Post-op Pain Management: Tylenol PO (pre-op)*   Induction: Intravenous  PONV Risk Score and Plan: Ondansetron and Dexamethasone  Airway Management Planned: Natural Airway and Simple Face Mask  Additional Equipment: None  Intra-op Plan:   Post-operative Plan:   Informed Consent: I have reviewed the patients History and Physical, chart, labs and discussed the procedure including the risks, benefits and alternatives for the proposed anesthesia with the patient or authorized representative who has indicated his/her understanding and acceptance.      Dental advisory given  Plan Discussed with: CRNA and Surgeon  Anesthesia Plan Comments:       Anesthesia Quick Evaluation

## 2022-07-17 NOTE — Progress Notes (Addendum)
[facility]  PROGRESS NOTE  Rebekah Burton ZDG:[id number] DOB: 1976/06/11 DOA: 07/07/2022 PCP: [name], NP   Brief HPI:    47 year old female with medical history of hypertension, ESRD on HD, MWF, PAD, blindness, bilateral transmetatarsal amputation, tobacco abuse came to ED with complaints of lower abdominal/pelvic pain which started on Tuesday afternoon 7/11.  Patient says that after Allises on Monday she started to have slow bleed from the fistula site of left arm on the following day, Tuesday she developed abdominal pain which was of moderate intensity and worsened with movement.  She saw her PCP who prescribed antibiotic.  Patient states that she missed dialysis on Wednesday due to severity of abdominal pain. Work-up in the hospital revealed symptomatic anemia with acute blood loss anemia from fistula ulcer, sepsis secondary to possible left-sided pyonephritis and MRSA bacteremia.  Patient underwent excision of infected left arm AV fistula and stent graft material with Dr. Stanford Breed on 7/17.  Patient suffered intraoperative cardiac arrest after placement of percutaneous temporary catheter.  ROSC achieved.  Patient completed procedure in the OR then was extubated in PACU.  She was monitored in the ICU on 7/17.   Subjective   Patient went into A-fib yesterday, was put on Cardizem gtt.  At this time she has converted back to normal sinus rhythm.  Blood pressure still mildly elevated.  Patient takes Catapres 0.3 mg 3 times daily at home.  She was started on low-dose of Catapres 0.1 p.o. 3 times daily yesterday.    Assessment/Plan:     Symptomatic anemia -Acute blood loss anemia from his fistula ulcers -She has history of anemia, disease -S/p excision of infected left arm AV fistula and stent graft on 7/17 -S/p PRBC on 7/14 -Received 2 units of PRBC for hemoglobin of 5.9 -This morning hemoglobin is 7.6  New onset atrial fibrillation -Patient went into A-fib on 07/16/2022  with heart rate 140s -Patient started on  Cardizem gtt. -Patient was on Eliquis at home for DVT which was held due to bleeding from fistula ulcers -Started on on heparin per pharmacy -We will check TSH; echocardiogram from 07/14/2022 showed EF of 65% -She has converted back to normal sinus rhythm.  We will wean off Cardizem   Sepsis secondary to possible left-sided pyelonephritis/MRSA bacteremia -Sepsis POA -Blood cultures positive for MRSA, started on vancomycin -Tissue culture from AV fistula abscess grew Staph aureus -Repeat blood cultures obtained on 7/16 is currently pending -ID following -TEE done today shows echodense mobile mass on mitral annulus likely calcification -ID recommends to continue with IV vancomycin for 6 weeks till 08/17/2022  PEA arrest -Developed PEA arrest intraoperative on 7/17 -Patient was extubated in PACU and monitored in the ICU -Transferred to progressive bed  ESRD on hemodialysis, MWF -Vascular surgery planning to do femoral [facility] today -Nephrology following  ?  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  Patient was started on Eliquis -Patient was discharged on Eliquis which is currently on hold -Started on heparin as above   Prolonged QT interval -QTc 502 ms -Avoid QT prolonging drugs   Pancreatic cyst -CT abdomen and pelvis showed cystic lesion of the tail of the pancreas measuring 17 mm -Nonemergent contrast-enhanced MRCP was recommended   Essential hypertension -Blood pressure was elevated, Coreg was restarted -Continue  hydralazine as needed -Patient also takes clonidine  0.3 mg 3 times daily at home -She was started back on clonidine at low-dose of 0.1 mg p.o. 3 times daily -Will increase dose of clonidine  to 0.3 mg 3 times daily which is patient's home  dose  GERD -Protonix   Tobacco use disorder -Patient counseled on tobacco abuse cessation   Coronary calcification -Recent CTA chest reviewed with findings of 4 vessel coronary calcification -Ordered and reviewed 2d echo, normal LVEF with no WMA     Medications     calcitRIOL  2.5 mcg Oral Q M,W,F-HD   carvedilol  25 mg Oral BID WC   Chlorhexidine Gluconate Cloth  6 each Topical Q0600   cinacalcet  90 mg Oral Q M,W,F-HD   cloNIDine  0.3 mg Oral TID   darbepoetin (ARANESP) injection - DIALYSIS  200 mcg Intravenous Q Fri-HD   lanthanum  1,000 mg Oral TID WC   multivitamin  1 tablet Oral QHS   pantoprazole  40 mg Oral Daily   polyethylene glycol  17 g Oral Daily   sodium chloride flush  10-40 mL Intracatheter Q12H     Data Reviewed:   CBG:  Recent Labs  Lab 07/11/22 1520 07/11/22 2306 07/12/22 0751 07/12/22 1153 07/12/22 1505  GLUCAP 160* 122* 138* 117* 115*    SpO2: 97 % O2 Flow Rate (L/min): 96 L/min    Vitals:   07/16/22 2038 07/16/22 2140 07/17/22 0340 07/17/22 0821  BP: 113/82 (!) 128/39 110/79 (!) 133/103  Pulse: 78 80 85 90  Resp: 15 20 16 16   Temp: 98.4 F (36.9 C) 98 F (36.7 C) 98.2 F (36.8 C) 97.6 F (36.4 C)  TempSrc: Oral Oral Oral Oral  SpO2: 99%  94% 97%  Weight: 95.3 kg  94.8 kg   Height:          Data Reviewed:  Basic Metabolic Panel: Recent Labs  Lab 07/11/22 0329 07/11/22 0920 07/11/22 1145 07/12/22 0317 07/13/22 0400 07/14/22 0540 07/15/22 0410  NA 136   < > 135 135 134* 134* 131*  K 3.6   < > 3.8 3.3* 3.6 3.7 4.6  CL 94*  --  91* 93* 96* 94* 92*  CO2 25  --  24 29 28 27 23   GLUCOSE 123*  --  188* 130* 109* 110* 128*  BUN 45*  --  51* 30* 41* 48* 57*  CREATININE 7.96*  --  8.39* 5.32* 6.61* 6.92* 8.51*  CALCIUM 9.7  --  9.8 9.1 8.9 9.3 9.9  MG  --   --  1.8 1.7  --   --   --   PHOS 4.0  --   --  3.6  --   --   --    < > = values in this interval not displayed.    CBC: Recent Labs  Lab 07/12/22 0317  07/12/22 1144 07/13/22 0400 07/14/22 0540 07/15/22 0410 07/17/22 0929  WBC 11.8*  --  12.0* 11.9* 12.4* 7.9  HGB 5.9* 7.8* 7.8* 7.9* 8.2* 7.6*  HCT 18.5* 23.7* 23.4* 24.4* 24.4* 23.3*  MCV 88.1  --  88.3 89.7 87.5 87.9  PLT 160  --  207 233 259 247    LFT Recent Labs  Lab 07/11/22 0329 07/14/22 0540  AST  --  42*  ALT  --  50*  ALKPHOS  --  103  BILITOT  --  1.9*  PROT  --  6.4*  ALBUMIN 2.8* 2.8*     Antibiotics: Anti-infectives (From admission, onward)  Start     Dose/Rate Route Frequency Ordered Stop   07/16/22 1200  vancomycin (VANCOCIN) IVPB 1000 mg/200 mL premix        1,000 mg 200 mL/hr over 60 Minutes Intravenous Every Sat (Hemodialysis) 07/15/22 1810 07/16/22 2001   07/13/22 1200  vancomycin (VANCOCIN) IVPB 1000 mg/200 mL premix        1,000 mg 200 mL/hr over 60 Minutes Intravenous Every M-W-F (Hemodialysis) 07/12/22 0757 08/17/22 2359   07/11/22 1258  vancomycin variable dose per unstable renal function (pharmacist dosing)  Status:  Discontinued         Does not apply See admin instructions 07/11/22 1258 07/12/22 0757   07/11/22 1200  vancomycin (VANCOREADY) IVPB 750 mg/150 mL  Status:  Discontinued        750 mg 150 mL/hr over 60 Minutes Intravenous Every M-W-F (Hemodialysis) 07/10/22 1246 07/11/22 1252   07/11/22 0745  vancomycin (VANCOCIN) IVPB 1000 mg/200 mL premix       Note to Pharmacy: On call to OR for 07:30 start   1,000 mg 200 mL/hr over 60 Minutes Intravenous  Once 07/11/22 0736 07/11/22 0757   07/11/22 0734  vancomycin (VANCOCIN) 1-5 GM/200ML-% IVPB       Note to Pharmacy: Maude Leriche: cabinet override      07/11/22 0734 07/11/22 0806   07/11/22 0730  vancomycin (VANCOCIN) 1,000 mg in sodium chloride 0.9 % 250 mL IVPB  Status:  Discontinued       Note to Pharmacy: On call to OR for 07:30 start   1,000 mg 250 mL/hr over 60 Minutes Intravenous  Once 07/11/22 0728 07/11/22 0733   07/10/22 1345  vancomycin (VANCOREADY) IVPB 750 mg/150 mL         750 mg 150 mL/hr over 60 Minutes Intravenous  Once 07/10/22 1246 07/10/22 1453   07/09/22 0915  vancomycin (VANCOREADY) IVPB 1500 mg/300 mL        1,500 mg 150 mL/hr over 120 Minutes Intravenous  Once 07/09/22 0820 07/09/22 1146   07/08/22 0600  cefTRIAXone (ROCEPHIN) 1 g in sodium chloride 0.9 % 100 mL IVPB  Status:  Discontinued        1 g 200 mL/hr over 30 Minutes Intravenous Every 24 hours 07/08/22 0512 07/10/22 1557        DVT prophylaxis: SCDs  Code Status: Full code  Family Communication: No family at bedside   CONSULTS nephrology   Objective    Physical Examination:  General-appears in no acute distress Heart-S1-S2, regular, no murmur auscultated Lungs-clear to auscultation bilaterally, no wheezing or crackles auscultated Abdomen-soft, nontender, no organomegaly Extremities-no edema in the lower extremities Neuro-alert, oriented x3, no focal deficit noted    Status is: Inpatient: Patient on IV vancomycin;         [place]   [facility] If 7PM-7AM, please contact night-coverage at [email or web address], Office  [phone]   07/17/2022, 1:59 PM  LOS: 9 days

## 2022-07-17 NOTE — Progress Notes (Signed)
[place] for IV heparin  Indication: atrial fibrillation  No Known Allergies  Patient Measurements: Height: 5\' 4"  (162.6 cm) Weight: 94.8 kg (208 lb 15.9 oz) IBW/kg (Calculated) : 54.7 Heparin Dosing Weight: 77 kg  Vital Signs: Temp: 98.4 F (36.9 C) (07/23 1504) Temp Source: Oral (07/23 1504) BP: 145/86 (07/23 1504) Pulse Rate: 90 (07/23 0821)  Labs: Recent Labs    07/15/22 0410 07/17/22 0012 07/17/22 0929 07/17/22 1832  HGB 8.2*  --  7.6*  --   HCT 24.4*  --  23.3*  --   PLT 259  --  247  --   HEPARINUNFRC  --  0.16* 0.28* 0.35  CREATININE 8.51*  --   --   --      Estimated Creatinine Clearance: 9.5 mL/min (A) (by C-G formula based on SCr of 8.51 mg/dL (H)).   Medical History: Past Medical History:  Diagnosis Date   Anemia    Blind    Blind    ESRD (end stage renal disease) ([place])    Foot ulceration ([place]) 12/2018   History of transmetatarsal amputation of left foot (HCC)    History of transmetatarsal amputation of right foot (HCC)    Hypertension    PAD (peripheral artery disease) ([facility])    Tobacco abuse     Medications:  Infusions:   heparin 1,400 Units/hr (07/17/22 1120)   vancomycin Stopped (07/13/22 1803)   Assessment: 46 y/o female with new onset atrial fibrillation. Prescribed apixaban PTA for DVT (05/2022), but was not taking per medication history. Pharmacy consulted to dose heparin. Patient had recent bleeding from fistula ulcers requiring transfusion.   Heparin level is therapeutic at 0.35. CBC is stable with hgb in 7s. No signs or symptoms of bleeding.   Goal of Therapy:  Heparin level 0.3-0.7 units/ml Monitor platelets by anticoagulation protocol: Yes   Plan:  Continue heparin gtt at 1,400 units/hr  Check daily heparin level and CBC Monitor for s/sx bleeding Follow up HD cath placement on 7/24  Louanne Belton, PharmD, [facility] PGY1 Pharmacy Resident 07/17/2022 7:47 PM

## 2022-07-17 NOTE — Progress Notes (Addendum)
[place] for IV heparin  Indication: atrial fibrillation  No Known Allergies  Patient Measurements: Height: 5\' 4"  (162.6 cm) Weight: 94.8 kg (208 lb 15.9 oz) IBW/kg (Calculated) : 54.7 Heparin Dosing Weight: 77 kg  Vital Signs: Temp: 97.6 F (36.4 C) (07/23 0821) Temp Source: Oral (07/23 0821) BP: 133/103 (07/23 0821) Pulse Rate: 90 (07/23 0821)  Labs: Recent Labs    07/15/22 0410 07/17/22 0012 07/17/22 0929  HGB 8.2*  --  7.6*  HCT 24.4*  --  23.3*  PLT 259  --  247  HEPARINUNFRC  --  0.16* 0.28*  CREATININE 8.51*  --   --      Estimated Creatinine Clearance: 9.5 mL/min (A) (by C-G formula based on SCr of 8.51 mg/dL (H)).   Medical History: Past Medical History:  Diagnosis Date   Anemia    Blind    Blind    ESRD (end stage renal disease) ([place])    Foot ulceration ([place]) 12/2018   History of transmetatarsal amputation of left foot (HCC)    History of transmetatarsal amputation of right foot (HCC)    Hypertension    PAD (peripheral artery disease) (HCC)    Tobacco abuse     Medications:  Infusions:   heparin 1,300 Units/hr (07/17/22 0130)   vancomycin Stopped (07/13/22 1803)   Assessment: 49 YOF with atrial fibrillation. Per med history, not taking apixaban PTA. Recent bleeding from fistula ulcers requiring transfusion noted. Current hgb 7.6 trending down from 8.2, PLTs wnl. Heparin level 0.28 subtherapeutic. No issues with infusion or bleeding reported per RN.   Goal of Therapy:  Heparin level 0.3-0.7 units/ml Monitor platelets by anticoagulation protocol: Yes   Plan:  Increase heparin to 1,400 units/hr  Heparin level in 8 hours  Daily heparin level and CBC while on heparin Monitor for s/sx bleeding Follow up HD cath placement   Eliseo Gum, PharmD PGY1 Pharmacy Resident   07/17/2022  10:21 AM

## 2022-07-17 NOTE — Progress Notes (Signed)
ANTICOAGULATION CONSULT NOTE - Follow Up Consult  Pharmacy Consult for heparin Indication: atrial fibrillation  Labs: Recent Labs    07/14/22 0540 07/15/22 0410 07/17/22 0012  HGB 7.9* 8.2*  --   HCT 24.4* 24.4*  --   PLT 233 259  --   LABPROT 14.8  --   --   INR 1.2  --   --   HEPARINUNFRC  --   --  0.16*  CREATININE 6.92* 8.51*  --     Assessment: 45yo female subtherapeutic on heparin with initial dosing for Afib; no infusion issues or signs of bleeding per RN.  Goal of Therapy:  Heparin level 0.3-0.7 units/ml   Plan:  Will increase heparin infusion by 2-3 units/kgABW/hr to 1300 units/hr and check level in 8 hours.    Wynona Neat, PharmD, BCPS  07/17/2022,1:11 AM

## 2022-07-17 NOTE — Progress Notes (Addendum)
[facility] Progress Note   Subjective:  no acute events. No complaints. Objective Vitals:   07/16/22 2038 07/16/22 2140 07/17/22 0340 07/17/22 0821  BP: 113/82 (!) 128/39 110/79 (!) 133/103  Pulse: 78 80 85 90  Resp: 15 20 16 16   Temp: 98.4 F (36.9 C) 98 F (36.7 C) 98.2 F (36.8 C) 97.6 F (36.4 C)  TempSrc: Oral Oral Oral Oral  SpO2: 99%  94% 97%  Weight: 95.3 kg  94.8 kg   Height:       Physical Exam General: chronically ill but nontoxic appearing, laying flat in bed Heart: RRR Lungs:clear ant Abdomen: soft, obese Extremities:no edema Dialysis Access: lue avf dressing in place, rt fem temp HD catheter  Additional Objective Labs: Basic Metabolic Panel: Recent Labs  Lab 07/10/22 1025 07/11/22 0329 07/11/22 0920 07/12/22 0317 07/13/22 0400 07/14/22 0540 07/15/22 0410  NA 136 136   < > 135 134* 134* 131*  K 3.7 3.6   < > 3.3* 3.6 3.7 4.6  CL 95* 94*   < > 93* 96* 94* 92*  CO2 26 25   < > 29 28 27 23   GLUCOSE 129* 123*   < > 130* 109* 110* 128*  BUN 37* 45*   < > 30* 41* 48* 57*  CREATININE 6.86* 7.96*   < > 5.32* 6.61* 6.92* 8.51*  CALCIUM 9.6 9.7   < > 9.1 8.9 9.3 9.9  PHOS 4.2 4.0  --  3.6  --   --   --    < > = values in this interval not displayed.   Liver Function Tests: Recent Labs  Lab 07/10/22 1025 07/11/22 0329 07/14/22 0540  AST  --   --  42*  ALT  --   --  50*  ALKPHOS  --   --  103  BILITOT  --   --  1.9*  PROT  --   --  6.4*  ALBUMIN 2.8* 2.8* 2.8*   No results for input(s): "LIPASE", "AMYLASE" in the last 168 hours.  CBC: Recent Labs  Lab 07/11/22 0329 07/11/22 0920 07/12/22 0317 07/12/22 1144 07/13/22 0400 07/14/22 0540 07/15/22 0410  WBC 10.5  --  11.8*  --  12.0* 11.9* 12.4*  HGB 7.0*   < > 5.9*   < > 7.8* 7.9* 8.2*  HCT 20.9*   < > 18.5*   < > 23.4* 24.4* 24.4*  MCV 86.7  --  88.1  --  88.3 89.7 87.5  PLT 126*  --  160  --  207 233 259   < > = values in this interval not displayed.   Blood Culture     Component Value Date/Time   SDES TISSUE ABSCESS 07/11/2022 0846   SDES ABSCESS 07/11/2022 0846   SPECREQUEST LEFT ARTERIOVENOUS FISTULA ABSC 07/11/2022 0846   SPECREQUEST LEFT ARTERIOVENOUS FISTULA ABSC 07/11/2022 0846   CULT  07/11/2022 0846    FEW METHICILLIN RESISTANT STAPHYLOCOCCUS AUREUS NO ANAEROBES ISOLATED Performed at [facility], [place] [address], [place], [place] [address]    CULT  07/11/2022 [phone]    FEW METHICILLIN RESISTANT STAPHYLOCOCCUS AUREUS NO ANAEROBES ISOLATED Performed at [facility], [place] [address], [place], [place] [address]    REPTSTATUS 07/16/2022 FINAL 07/11/2022 0846   REPTSTATUS 07/16/2022 FINAL 07/11/2022 0846    Cardiac Enzymes: No results for input(s): "CKTOTAL", "CKMB", "CKMBINDEX", "TROPONINI" in the last 168 hours. CBG: Recent Labs  Lab 07/11/22 1520 07/11/22 2306 07/12/22 0751 07/12/22  1153 07/12/22 1505  GLUCAP 160* 122* 138* 117* 115*   Iron Studies:  No results for input(s): "IRON", "TIBC", "TRANSFERRIN", "FERRITIN" in the last 72 hours.  @lablastinr3 @ Studies/Results: No results found. Medications:  diltiazem (CARDIZEM) infusion 10 mg/hr (07/17/22 1062)   heparin 1,300 Units/hr (07/17/22 0130)   vancomycin Stopped (07/13/22 1803)    calcitRIOL  2.5 mcg Oral Q M,W,F-HD   carvedilol  25 mg Oral BID WC   Chlorhexidine Gluconate Cloth  6 each Topical Q0600   cinacalcet  90 mg Oral Q M,W,F-HD   cloNIDine  0.1 mg Oral TID   darbepoetin (ARANESP) injection - DIALYSIS  200 mcg Intravenous Q Fri-HD   lanthanum  1,000 mg Oral TID WC   multivitamin  1 tablet Oral QHS   pantoprazole  40 mg Oral Daily   polyethylene glycol  17 g Oral Daily   sodium chloride flush  10-40 mL Intracatheter Q12H    Assessment/Recommendations:    ESRD:  -outpatient orders: [facility].  4 hours 15 minutes.  MWF.  EDW 102 kg.  Revaclear 300.  400/500.  2K, 2.5 Cal.  Meds: Calcitriol 2.5 mcg 3 times weekly, Sensipar 90 mg 3 times weekly,  Mircera 200 mg every 2 weeks (last dose on Monday 7/10).  No heparin -will continue with HD on MWF schedule. HD via rt fem temp line (placed 7/17 in OR) -plan for femoral [facility] Monday w/ VVS (has SVC occlusion) then HD   Sepsis, +MRSA bacteremia -seen by ID- vanc w/ HD (last day 08/17/22); bcx pos on 7/14 -avoid placing TDC , has temp HD catheter until cultures clear -intraop gram stain+culture sent 7/17-few MRSA. Bcx 7/16 NGTD. Planning for [facility] on Monday -TEE 7/20 with no endocarditis  Brief bradycardic arrest 07/11/22 -intraoperative: possible related to adverse effect of neosynephrine and fentanyl. Extubated in pacu. Now stable/out of ICU   Abdominal pain, early/partial SBO -mgmt per primary service, appears improved   Volume/ hypertension: EDW 102kg. Will UF as tolerated    Anemia of Chronic Kidney Disease, acute anemia: required transfusions, cont to monitor -FOBT negative, transfused for Hgb <7 -iron sat 10% but hold IV iron in setting of bacteremia -last dose of mircera per outpatient unit was 7/10, restarted ESA on 7/21   Secondary Hyperparathyroidism/Hyperphosphatemia: on calcitriol and sensipar, will start renvela if po4 trends up. 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  Status post right femoral temp HD line on 7/17 in the OR. Femoral TDC planned for Monday especially since repeat cultures have been negative and given that she has SVC occlusion. Appreciate VVS assistance

## 2022-07-17 NOTE — Progress Notes (Signed)
Pharmacy Antibiotic Note  Rebekah Burton is a 46 y.o. female admitted on 07/07/2022 with MRSA bacteremia. Pharmacy has been consulted for Vancomycin dosing. Pt is afebrile and WBCs slightly elevated. Last HD session 7/22, tolerated 4 hours and received post-HD dose vancomycin 1,000 mg IV x1. Planning for [facility] placement and HD 7/24.   Plan: Continue with Vancomycin 1,000 mg with HD every MWF Obtain pre-HD vancomycin level 7/24 Follow HD plans, CBC, s/sx infection    Height: 5\' 4"  (162.6 cm) Weight: 94.8 kg (208 lb 15.9 oz) IBW/kg (Calculated) : 54.7  Temp (24hrs), Avg:98.1 F (36.7 C), Min:97.6 F (36.4 C), Max:98.4 F (36.9 C)  Recent Labs  Lab 07/11/22 0329 07/11/22 1145 07/12/22 0317 07/13/22 0400 07/14/22 0540 07/15/22 0410  WBC 10.5  --  11.8* 12.0* 11.9* 12.4*  CREATININE 7.96* 8.39* 5.32* 6.61* 6.92* 8.51*  VANCORANDOM  --  40  --   --   --   --      Estimated Creatinine Clearance: 9.5 mL/min (A) (by C-G formula based on SCr of 8.51 mg/dL (H)).    No Known Allergies  Antimicrobials this admission: Vancomycin 7/15 >>   Microbiology results: 7/14 Bcx- MRSA  7/16 Bcx- ng final  7/17 MRSA PCR- pos  7/17 abscess Cx - MRSA   Thank you for allowing pharmacy to be a part of this patient's care.  Eliseo Gum, PharmD PGY1 Pharmacy Resident   07/17/2022  9:07 AM

## 2022-07-18 ENCOUNTER — Encounter (HOSPITAL_COMMUNITY)
Admission: EM | Disposition: A | Discharge status: Home / Self Care | Payer: Self-pay | Source: Home / Self Care | Attending: Family Medicine

## 2022-07-18 ENCOUNTER — Inpatient Hospital Stay (HOSPITAL_COMMUNITY): Payer: Medicare HMO | Admitting: Anesthesiology

## 2022-07-18 ENCOUNTER — Inpatient Hospital Stay (HOSPITAL_COMMUNITY): Payer: Medicare HMO

## 2022-07-18 ENCOUNTER — Other Ambulatory Visit: Payer: Self-pay

## 2022-07-18 DIAGNOSIS — E1122 Type 2 diabetes mellitus with diabetic chronic kidney disease: Secondary | ICD-10-CM

## 2022-07-18 DIAGNOSIS — T82898A Other specified complication of vascular prosthetic devices, implants and grafts, initial encounter: Secondary | ICD-10-CM | POA: Diagnosis not present

## 2022-07-18 DIAGNOSIS — N185 Chronic kidney disease, stage 5: Secondary | ICD-10-CM | POA: Diagnosis not present

## 2022-07-18 DIAGNOSIS — N186 End stage renal disease: Secondary | ICD-10-CM

## 2022-07-18 DIAGNOSIS — D631 Anemia in chronic kidney disease: Secondary | ICD-10-CM

## 2022-07-18 DIAGNOSIS — I120 Hypertensive chronic kidney disease with stage 5 chronic kidney disease or end stage renal disease: Secondary | ICD-10-CM

## 2022-07-18 HISTORY — PX: INSERTION OF DIALYSIS CATHETER: SHX1324

## 2022-07-18 LAB — POCT I-STAT, CHEM 8
BUN: 49 mg/dL — ABNORMAL HIGH (ref 6–20)
Calcium, Ion: 1.16 mmol/L (ref 1.15–1.40)
Chloride: 94 mmol/L — ABNORMAL LOW (ref 98–111)
Creatinine, Ser: 8.7 mg/dL — ABNORMAL HIGH (ref 0.44–1.00)
Glucose, Bld: 93 mg/dL (ref 70–99)
HCT: 25 % — ABNORMAL LOW (ref 36.0–46.0)
Hemoglobin: 8.5 g/dL — ABNORMAL LOW (ref 12.0–15.0)
Potassium: 4.1 mmol/L (ref 3.5–5.1)
Sodium: 132 mmol/L — ABNORMAL LOW (ref 135–145)
TCO2: 26 mmol/L (ref 22–32)

## 2022-07-18 LAB — CBC
HCT: 24.6 % — ABNORMAL LOW (ref 36.0–46.0)
Hemoglobin: 8 g/dL — ABNORMAL LOW (ref 12.0–15.0)
MCH: 29.2 pg (ref 26.0–34.0)
MCHC: 32.5 g/dL (ref 30.0–36.0)
MCV: 89.8 fL (ref 80.0–100.0)
Platelets: 268 10*3/uL (ref 150–400)
RBC: 2.74 MIL/uL — ABNORMAL LOW (ref 3.87–5.11)
RDW: 17.9 % — ABNORMAL HIGH (ref 11.5–15.5)
WBC: 7.8 10*3/uL (ref 4.0–10.5)
nRBC: 0 % (ref 0.0–0.2)

## 2022-07-18 LAB — VANCOMYCIN, RANDOM: Vancomycin Rm: 29 ug/mL

## 2022-07-18 LAB — HEPARIN LEVEL (UNFRACTIONATED): Heparin Unfractionated: 0.47 IU/mL (ref 0.30–0.70)

## 2022-07-18 SURGERY — INSERTION OF DIALYSIS CATHETER
Anesthesia: Monitor Anesthesia Care | Site: Groin | Laterality: Right

## 2022-07-18 MED ORDER — HEPARIN SODIUM (PORCINE) 1000 UNIT/ML IJ SOLN
INTRAMUSCULAR | Status: DC | PRN
  Administered 2022-07-18: 6200 [IU]

## 2022-07-18 MED ORDER — HEPARIN 6000 UNIT IRRIGATION SOLUTION
Status: AC
  Filled 2022-07-18: qty 500

## 2022-07-18 MED ORDER — CHLORHEXIDINE GLUCONATE 0.12 % MT SOLN: 15.0000 mL | Freq: Once | OROMUCOSAL | Status: AC

## 2022-07-18 MED ORDER — CHLORHEXIDINE GLUCONATE 0.12 % MT SOLN
OROMUCOSAL | Status: AC
  Administered 2022-07-18: 15 mL via OROMUCOSAL
  Filled 2022-07-18: qty 15

## 2022-07-18 MED ORDER — HEPARIN SODIUM (PORCINE) 1000 UNIT/ML IJ SOLN
INTRAMUSCULAR | Status: AC
  Filled 2022-07-18: qty 10

## 2022-07-18 MED ORDER — IBUPROFEN 600 MG PO TABS: 600.0000 mg | ORAL_TABLET | Freq: Three times a day (TID) | ORAL | Status: DC

## 2022-07-18 MED ORDER — MIDAZOLAM HCL 2 MG/2ML IJ SOLN: 0.5000 mg | Freq: Once | INTRAMUSCULAR | Status: DC | PRN

## 2022-07-18 MED ORDER — SODIUM CHLORIDE 0.9 % IV SOLN: INTRAVENOUS | Status: DC | PRN

## 2022-07-18 MED ORDER — OXYCODONE HCL 5 MG/5ML PO SOLN: 5.0000 mg | Freq: Once | ORAL | Status: DC | PRN

## 2022-07-18 MED ORDER — ORAL CARE MOUTH RINSE: 15.0000 mL | Freq: Once | OROMUCOSAL | Status: AC

## 2022-07-18 MED ORDER — PROPOFOL 10 MG/ML IV BOLUS
INTRAVENOUS | Status: AC
Start: 2022-07-18 — End: ?
  Filled 2022-07-18: qty 20

## 2022-07-18 MED ORDER — PANTOPRAZOLE SODIUM 40 MG PO TBEC: 40.0000 mg | DELAYED_RELEASE_TABLET | Freq: Every day | ORAL | Status: DC

## 2022-07-18 MED ORDER — APIXABAN 5 MG PO TABS
5.0000 mg | ORAL_TABLET | Freq: Two times a day (BID) | ORAL | Status: DC
  Administered 2022-07-19: 5 mg via ORAL
  Filled 2022-07-18 (×2): qty 1

## 2022-07-18 MED ORDER — FENTANYL CITRATE (PF) 250 MCG/5ML IJ SOLN
INTRAMUSCULAR | Status: AC
  Filled 2022-07-18: qty 5

## 2022-07-18 MED ORDER — LIDOCAINE-EPINEPHRINE 1 %-1:200000 IJ SOLN
INTRAMUSCULAR | Status: AC
Start: 2022-07-18 — End: ?
  Filled 2022-07-18: qty 30

## 2022-07-18 MED ORDER — HEPARIN 6000 UNIT IRRIGATION SOLUTION
Status: DC | PRN
  Administered 2022-07-18: 1

## 2022-07-18 MED ORDER — PHENYLEPHRINE HCL-NACL 20-0.9 MG/250ML-% IV SOLN
INTRAVENOUS | Status: DC | PRN
  Administered 2022-07-18: 75 ug/min via INTRAVENOUS

## 2022-07-18 MED ORDER — MIDAZOLAM HCL 2 MG/2ML IJ SOLN
INTRAMUSCULAR | Status: AC
  Filled 2022-07-18: qty 2

## 2022-07-18 MED ORDER — HEPARIN SODIUM (PORCINE) 1000 UNIT/ML IJ SOLN
INTRAMUSCULAR | Status: AC
  Filled 2022-07-18: qty 3

## 2022-07-18 MED ORDER — VANCOMYCIN HCL IN DEXTROSE 1-5 GM/200ML-% IV SOLN: 1000.0000 mg | INTRAVENOUS | Status: DC

## 2022-07-18 MED ORDER — PROMETHAZINE HCL 25 MG/ML IJ SOLN: 6.2500 mg | INTRAMUSCULAR | Status: DC | PRN

## 2022-07-18 MED ORDER — FENTANYL CITRATE (PF) 100 MCG/2ML IJ SOLN: 25.0000 ug | INTRAMUSCULAR | Status: DC | PRN

## 2022-07-18 MED ORDER — CARVEDILOL 12.5 MG PO TABS
ORAL_TABLET | ORAL | Status: AC
  Administered 2022-07-18: 25 mg
  Filled 2022-07-18: qty 2

## 2022-07-18 MED ORDER — SENNA 8.6 MG PO TABS
1.0000 | ORAL_TABLET | Freq: Two times a day (BID) | ORAL | Status: DC
  Administered 2022-07-18: 8.6 mg via ORAL
  Filled 2022-07-18 (×2): qty 1

## 2022-07-18 MED ORDER — PROPOFOL 10 MG/ML IV BOLUS
INTRAVENOUS | Status: DC | PRN
  Administered 2022-07-18: 10 mg via INTRAVENOUS

## 2022-07-18 MED ORDER — CEFAZOLIN SODIUM-DEXTROSE 2-4 GM/100ML-% IV SOLN
INTRAVENOUS | Status: AC
  Filled 2022-07-18: qty 100

## 2022-07-18 MED ORDER — DIPHENHYDRAMINE HCL 25 MG PO CAPS
25.0000 mg | ORAL_CAPSULE | Freq: Once | ORAL | Status: AC
  Administered 2022-07-18: 25 mg via ORAL
  Filled 2022-07-18: qty 1

## 2022-07-18 MED ORDER — MEPERIDINE HCL 25 MG/ML IJ SOLN: 6.2500 mg | INTRAMUSCULAR | Status: DC | PRN

## 2022-07-18 MED ORDER — LACTATED RINGERS IV SOLN: INTRAVENOUS | Status: DC

## 2022-07-18 MED ORDER — HEPARIN (PORCINE) 25000 UT/250ML-% IV SOLN
1400.0000 [IU]/h | INTRAVENOUS | Status: AC
  Administered 2022-07-18: 1400 [IU]/h via INTRAVENOUS
  Filled 2022-07-18 (×2): qty 250

## 2022-07-18 MED ORDER — PROPOFOL 500 MG/50ML IV EMUL
INTRAVENOUS | Status: DC | PRN
  Administered 2022-07-18: 75 ug/kg/min via INTRAVENOUS

## 2022-07-18 MED ORDER — OXYCODONE HCL 5 MG PO TABS: 5.0000 mg | ORAL_TABLET | Freq: Once | ORAL | Status: DC | PRN

## 2022-07-18 MED ORDER — CEFAZOLIN SODIUM-DEXTROSE 2-3 GM-%(50ML) IV SOLR
INTRAVENOUS | Status: DC | PRN
  Administered 2022-07-18: 2 g via INTRAVENOUS

## 2022-07-18 MED ORDER — TRAZODONE HCL 50 MG PO TABS
150.0000 mg | ORAL_TABLET | Freq: Every day | ORAL | Status: DC
  Administered 2022-07-18: 150 mg via ORAL
  Filled 2022-07-18 (×2): qty 1

## 2022-07-18 MED ORDER — LIDOCAINE-EPINEPHRINE (PF) 1 %-1:200000 IJ SOLN
INTRAMUSCULAR | Status: DC | PRN
  Administered 2022-07-18: 13 mL

## 2022-07-18 MED ORDER — LIDOCAINE 2% (20 MG/ML) 5 ML SYRINGE
INTRAMUSCULAR | Status: DC | PRN
  Administered 2022-07-18: 30 mg via INTRAVENOUS

## 2022-07-18 MED ORDER — CLONIDINE HCL 0.2 MG PO TABS: 0.3000 mg | ORAL_TABLET | Freq: Three times a day (TID) | ORAL | Status: DC

## 2022-07-18 SURGICAL SUPPLY — 35 items
BAG COUNTER SPONGE SURGICOUNT (BAG) ×2 IMPLANT
BAG DECANTER FOR FLEXI CONT (MISCELLANEOUS) ×2 IMPLANT
BIOPATCH RED 1 DISK 7.0 (GAUZE/BANDAGES/DRESSINGS) ×2 IMPLANT
COVER DOME SNAP 22 D (MISCELLANEOUS) ×1 IMPLANT
COVER PROBE W GEL 5X96 (DRAPES) IMPLANT
COVER SURGICAL LIGHT HANDLE (MISCELLANEOUS) ×2 IMPLANT
DRAPE CHEST BREAST 15X10 FENES (DRAPES) ×2 IMPLANT
DRSG COVADERM 4X6 (GAUZE/BANDAGES/DRESSINGS) ×1 IMPLANT
GAUZE 4X4 16PLY ~~LOC~~+RFID DBL (SPONGE) ×2 IMPLANT
GLOVE BIO SURGEON STRL SZ7.5 (GLOVE) ×2 IMPLANT
GLOVE BIOGEL PI IND STRL 8 (GLOVE) ×1 IMPLANT
GLOVE BIOGEL PI INDICATOR 8 (GLOVE) ×1
GLOVE SURG POLY ORTHO LF SZ7.5 (GLOVE) IMPLANT
GLOVE SURG UNDER LTX SZ8 (GLOVE) ×2 IMPLANT
GOWN STRL REUS W/ TWL LRG LVL3 (GOWN DISPOSABLE) ×2 IMPLANT
GOWN STRL REUS W/TWL LRG LVL3 (GOWN DISPOSABLE) ×2
KIT BASIN OR (CUSTOM PROCEDURE TRAY) ×2 IMPLANT
KIT PALINDROME-P 55CM (CATHETERS) ×1 IMPLANT
KIT TURNOVER KIT B (KITS) ×2 IMPLANT
NDL 18GX1X1/2 (RX/OR ONLY) (NEEDLE) ×1 IMPLANT
NDL HYPO 25GX1X1/2 BEV (NEEDLE) ×1 IMPLANT
NEEDLE 18GX1X1/2 (RX/OR ONLY) (NEEDLE) ×2 IMPLANT
NEEDLE HYPO 25GX1X1/2 BEV (NEEDLE) ×2 IMPLANT
NS IRRIG 1000ML POUR BTL (IV SOLUTION) ×2 IMPLANT
PACK SURGICAL SETUP 50X90 (CUSTOM PROCEDURE TRAY) ×2 IMPLANT
PAD ARMBOARD 7.5X6 YLW CONV (MISCELLANEOUS) ×4 IMPLANT
SUT ETHILON 3 0 PS 1 (SUTURE) ×2 IMPLANT
SUT MNCRL AB 4-0 PS2 18 (SUTURE) ×2 IMPLANT
SYR 10ML LL (SYRINGE) ×2 IMPLANT
SYR 20ML LL LF (SYRINGE) ×4 IMPLANT
SYR 5ML LL (SYRINGE) ×4 IMPLANT
SYR CONTROL 10ML LL (SYRINGE) ×2 IMPLANT
TOWEL GREEN STERILE (TOWEL DISPOSABLE) ×4 IMPLANT
TOWEL GREEN STERILE FF (TOWEL DISPOSABLE) ×2 IMPLANT
WATER STERILE IRR 1000ML POUR (IV SOLUTION) ×2 IMPLANT

## 2022-07-18 NOTE — Progress Notes (Addendum)
Pharmacy Antibiotic Note-Follow Up  Rebekah Burton is a 46 y.o. female admitted on 07/07/2022 with MRSA bacteremia. Pharmacy has been consulted for Vancomycin dosing. Pt is afebrile and WBCs slightly elevated. Last HD session 7/24. Vancomycin random 29. After dialysis today, patient should remain in calculated target range at a level of 16 mcg/mL. WBC 7.8, Scr 8.7, afebrile.  Plan: Hold Vancomycin dose and continue 1000mg  with HD on 7/26 (Wednesday) Continue with Vancomycin 1,000 mg with HD every MWF Obtain pre-HD vancomycin level 7/26 Follow HD plans, CBC, s/sx infection    Height: 5\' 4"  (162.6 cm) Weight: 98 kg (216 lb 0.8 oz) IBW/kg (Calculated) : 54.7  Temp (24hrs), Avg:98.2 F (36.8 C), Min:97.5 F (36.4 C), Max:98.8 F (37.1 C)  Recent Labs  Lab 07/11/22 1145 07/11/22 1145 07/12/22 0317 07/13/22 0400 07/14/22 0540 07/15/22 0410 07/17/22 0929 07/18/22 0717 07/18/22 0920  WBC  --    < > 11.8* 12.0* 11.9* 12.4* 7.9  --  7.8  CREATININE 8.39*  --  5.32* 6.61* 6.92* 8.51*  --  8.70*  --   VANCORANDOM 40  --   --   --   --   --   --   --  29   < > = values in this interval not displayed.     Estimated Creatinine Clearance: 9.5 mL/min (A) (by C-G formula based on SCr of 8.7 mg/dL (H)).    No Known Allergies  Antimicrobials this admission: Vancomycin 7/15 >>   Microbiology results: 7/14 Bcx- MRSA  7/16 Bcx- ng final  7/17 MRSA PCR- pos  7/17 abscess Cx - MRSA   Thank you for allowing pharmacy to be a part of this patient's care.  Sandford Craze, PharmD. [facility] Acute Care PGY-1  07/18/2022 11:29 AM

## 2022-07-18 NOTE — Interval H&P Note (Signed)
History and Physical Interval Note:  07/18/2022 7:13 AM  Rebekah Burton  has presented today for surgery, with the diagnosis of ESRD.  The various methods of treatment have been discussed with the patient and family. After consideration of risks, benefits and other options for treatment, the patient has consented to  Procedure(s): INSERTION OF TUNNELED DIALYSIS CATHETER (N/A) as a surgical intervention.  The patient's history has been reviewed, patient examined, no change in status, stable for surgery.  I have reviewed the patient's chart and labs.  Questions were answered to the patient's satisfaction.     [name]

## 2022-07-18 NOTE — Op Note (Signed)
    NAME: Rebekah Burton    MRN: [id number] DOB: 1976/06/05    DATE OF OPERATION: 07/18/2022  PREOP DIAGNOSIS:    End-stage renal disease  POSTOP DIAGNOSIS:    Same  PROCEDURE:    Placement of 55 cm tunneled dialysis catheter  SURGEON: Judeth Cornfield. Scot Dock, MD  ASSIST: None  ANESTHESIA: Local with sedation  EBL: Minimal  INDICATIONS:    [name] is a 46 y.o. female who had an infected upper arm fistula removed.  She had a temporary right femoral catheter placed.  She presents for placement of a new tunneled dialysis catheter.  She had a venogram which showed a occlusion of her SVC.  FINDINGS:   Good return from both ports  TECHNIQUE:   The patient was taken to the operating room and sedated by anesthesia.  Both groins and thighs were prepped and draped in usual sterile fashion.  The sutures were removed from the temporary catheter.  The catheter was retracted slightly and clamped.  It was then cut.  A 55 cm catheter was selected.  I then tunneled this catheter to the groin incision.  Next the wire was advanced through the old catheter and the catheter removed.  The catheter was advanced into the right atrium.  The dilator and peel-away sheath were then advanced over the wire and the wire and dilator removed.  The 55 cm catheter was passed through the peel-away sheath and positioned in the right atrium.  The peel-away sheath was removed.  Both ports were weaved through easily with and flushed with heparinized saline and filled with concentrated heparin.  The catheter was secured at its exit site with 3 nylon sutures.  The old catheter cannulation site was closed with a horizontal mattress 3-0 nylon suture.  A sterile dressing was applied.  The patient tolerated the procedure well.  She was transferred to the recovery room in stable condition.  All needle and sponge counts were correct.  [name], MD, FACS [facility]  DATE OF  DICTATION:   07/18/2022

## 2022-07-18 NOTE — Anesthesia Procedure Notes (Signed)
Procedure Name: MAC Date/Time: 07/18/2022 7:42 AM  Performed by: Mariea Clonts, CRNAPre-anesthesia Checklist: Patient identified, Emergency Drugs available, Suction available, Patient being monitored and Timeout performed Patient Re-evaluated:Patient Re-evaluated prior to induction Oxygen Delivery Method: Simple face mask

## 2022-07-18 NOTE — Progress Notes (Signed)
[facility] Progress Note   Subjective:   Patient seen and examined at bedside while eating breakfast.  HD R thigh TDC placed this AM.  Initially refused to go to HD because she wanted to eat breakfast but now agrees to go.  States she is feeling better today.  Denies CP, SOB, abdominal pain and n/v/d.  Went into A fib yesterday requiring Cardizem drip but now back in sinus rhythm.   Objective Vitals:   07/18/22 1130 07/18/22 1203 07/18/22 1230 07/18/22 1248  BP: 118/85 (!) 139/104 (!) 139/100 (!) 149/103  Pulse: 60 (!) 57 63 62  Resp: (!) 9 (!) 8 (!) 9 12  Temp:      TempSrc:      SpO2: 97% 98%  97%  Weight:      Height:       Physical Exam General:chronically ill appearing, blind female in NAD Heart:RRR, no mrg Lungs:CTAB, nml WOB on RA Abdomen:soft, NTNF Extremities:no LE edema Dialysis Access: LU AVF dressing in place, L femoral [facility]   Filed Weights   07/18/22 0420 07/18/22 0657 07/18/22 1036  Weight: 95.2 kg 100.7 kg 98 kg    Intake/Output Summary (Last 24 hours) at 07/18/2022 1323 Last data filed at 07/18/2022 0815 Gross per 24 hour  Intake 580.86 ml  Output --  Net 580.86 ml    Additional Objective Labs: Basic Metabolic Panel: Recent Labs  Lab 07/12/22 0317 07/13/22 0400 07/14/22 0540 07/15/22 0410 07/18/22 0717  NA 135 134* 134* 131* 132*  K 3.3* 3.6 3.7 4.6 4.1  CL 93* 96* 94* 92* 94*  CO2 29 28 27 23   --   GLUCOSE 130* 109* 110* 128* 93  BUN 30* 41* 48* 57* 49*  CREATININE 5.32* 6.61* 6.92* 8.51* 8.70*  CALCIUM 9.1 8.9 9.3 9.9  --   PHOS 3.6  --   --   --   --    Liver Function Tests: Recent Labs  Lab 07/14/22 0540  AST 42*  ALT 50*  ALKPHOS 103  BILITOT 1.9*  PROT 6.4*  ALBUMIN 2.8*   CBC: Recent Labs  Lab 07/13/22 0400 07/14/22 0540 07/15/22 0410 07/17/22 0929 07/18/22 0717 07/18/22 0920  WBC 12.0* 11.9* 12.4* 7.9  --  7.8  HGB 7.8* 7.9* 8.2* 7.6* 8.5* 8.0*  HCT 23.4* 24.4* 24.4* 23.3* 25.0* 24.6*  MCV 88.3  89.7 87.5 87.9  --  89.8  PLT 207 233 259 247  --  268    CBG: Recent Labs  Lab 07/11/22 1520 07/11/22 2306 07/12/22 0751 07/12/22 1153 07/12/22 1505  GLUCAP 160* 122* 138* 117* 115*   Studies/Results: DG Chest Port 1 View  Result Date: 07/18/2022 CLINICAL DATA:  Replaced right femoral catheter for dialysis EXAM: PORTABLE CHEST 1 VIEW COMPARISON:  Radiographs 07/07/2022 CT chest 07/13/2022 FINDINGS: Partially visualized inferiorly inserted central venous catheter tip at the superior cavoatrial junction. Cardiomegaly. Hazy bilateral airspace and interstitial opacities in keeping with pulmonary edema. Aortic calcification. No acute osseous abnormality. IMPRESSION: 1. The inferiorly inserted CVC tip projects at the superior cavoatrial junction. 2. Pulmonary edema. 3. Cardiomegaly. Electronically Signed   By: Placido Sou M.D.   On: 07/18/2022 08:47   HYBRID OR IMAGING ([facility] ONLY)  Result Date: 07/18/2022 There is no interpretation for this exam.  This order is for images obtained during a surgical procedure.  Please See "Surgeries" Tab for more information regarding the procedure.    Medications:  ceFAZolin     heparin     [  START ON 07/19/2022] vancomycin      [START ON 07/19/2022] apixaban  5 mg Oral BID   calcitRIOL  2.5 mcg Oral Q M,W,F-HD   carvedilol  25 mg Oral BID WC   Chlorhexidine Gluconate Cloth  6 each Topical Q0600   cinacalcet  90 mg Oral Q M,W,F-HD   cloNIDine  0.3 mg Oral TID   darbepoetin (ARANESP) injection - DIALYSIS  200 mcg Intravenous Q Fri-HD   lanthanum  1,000 mg Oral TID WC   multivitamin  1 tablet Oral QHS   pantoprazole  40 mg Oral Daily   polyethylene glycol  17 g Oral Daily   senna  1 tablet Oral BID   sodium chloride flush  10-40 mL Intracatheter Q12H   traZODone  150 mg Oral QHS    Dialysis Orders: -outpatient orders: [facility].  4 hours 15 minutes.  MWF.  EDW 102 kg.  Revaclear 300.  400/500.  2K, 2.5 Cal.  Meds: Calcitriol 2.5 mcg 3 times  weekly, Sensipar 90 mg 3 times weekly, Mircera 200 mg every 2 weeks (last dose on Monday 7/10).  No heparin   Assessment/Plan: 1. Sepsis/MRSA bacteremia - BC+7/14, WC+ 7/17, TEE 7/20 with no signs endocarditis.  Excision of infected LU AVG 7/17 with temp cath placement.  New L thigh TDC  placed today by Dr. Scot Dock. ID following recommend Vancomycin until 08/17/22.   2. Brief bradycardic arrest - intraoperative.  3. New onset A fib - started on Cardizem drip yesterday & weaned off. Now back NSR. 4. Vascular access - Infected LU AVF w/ulceration s/p excision on 7/17 by Dr. Stanford Breed. Femoral TDC placed today by Dr. Scot Dock d/t SVC occlusion.  5. Abdominal pain - resolved.  6. ESRD - on HD MWF.  HD today per regular schedule using new TDC. 7. Anemia of CKD- Hgb 8.0. No iron in setting of bacteremia.  ESA given on 7/21. 8. Secondary hyperparathyroidism - Ca in goal.  Continue calcitriol and sensipar.  Phos in goal with fosrenol.  9. HTN/volume - BP variable, on carvedilol 25mg  BID and clonidine 0.3mg  TID restarted yesterday.  CXR today with pulmonary edema.  Does not appear grossly volume overloaded.  On RA.  UF as tolerated with HD today. Under EDW, suspect weight loss and will need new dry weight on d/c. 10. Nutrition - Renal diet w/fluids restrictions.  10. Dispo - ok for d/c from renal standpoint  Jen Mow, PA-C [place] 07/18/2022,1:23 PM  LOS: 10 days

## 2022-07-18 NOTE — TOC Progression Note (Addendum)
Transition of Care [facility]) - Progression Note    Patient Details  Name: Rebekah Burton MRN: [id number] Date of Birth: 1976-12-03  Transition of Care [facility] CM/SW Contact  Zenon Mayo, RN Phone Number: 07/18/2022, 4:22 PM  Clinical Narrative:    From [place] symptomatic anemia from fistula ulcer, blind, new fistula placed today. Dialysis MWF, TOC following.   Expected Discharge Plan: Assisted Living Barriers to Discharge: Continued Medical Work up  Expected Discharge Plan and Services Expected Discharge Plan: Assisted Living In-house Referral: Clinical Social Work                                             Social Determinants of Health (SDOH) Interventions    Readmission Risk Interventions    03/16/2021    2:06 PM  Readmission Risk Prevention Plan  Transportation Screening Complete  PCP or Specialist Appt within 5-7 Days Complete  Home Care Screening Complete  Medication Review (RN CM) Complete

## 2022-07-18 NOTE — Anesthesia Postprocedure Evaluation (Signed)
Anesthesia Post Note  Patient: Rebekah Burton  Procedure(s) Performed: INSERTION OF TUNNELED DIALYSIS CATHETER (Right: Groin)     Patient location during evaluation: PACU Anesthesia Type: MAC Level of consciousness: awake and alert, patient cooperative and oriented Pain management: pain level controlled Vital Signs Assessment: post-procedure vital signs reviewed and stable Respiratory status: spontaneous breathing, nonlabored ventilation and respiratory function stable Cardiovascular status: stable and blood pressure returned to baseline Postop Assessment: no apparent nausea or vomiting Anesthetic complications: no   No notable events documented.  Last Vitals:  Vitals:   07/18/22 0830 07/18/22 0845  BP: (!) 138/94 (!) 140/96  Pulse:    Resp:    Temp: 36.7 C   SpO2:      Last Pain:  Vitals:   07/18/22 0830  TempSrc:   PainSc: 0-No pain                 Anaisa Radi,E. Khalie Wince

## 2022-07-18 NOTE — Progress Notes (Signed)
ANTICOAGULATION CONSULT NOTE - Follow Up  Pharmacy Consult for IV heparin  Indication: atrial fibrillation  No Known Allergies  Patient Measurements: Height: 5\' 4"  (162.6 cm) Weight: 98 kg (216 lb 0.8 oz) IBW/kg (Calculated) : 54.7 Heparin Dosing Weight: 77 kg  Vital Signs: Temp: 97.7 F (36.5 C) (07/24 1036) Temp Source: Oral (07/24 1036) BP: 145/104 (07/24 1041) Pulse Rate: 65 (07/24 1041)  Labs: Recent Labs    07/17/22 0929 07/17/22 1832 07/18/22 0717 07/18/22 0920  HGB 7.6*  --  8.5* 8.0*  HCT 23.3*  --  25.0* 24.6*  PLT 247  --   --  268  HEPARINUNFRC 0.28* 0.35  --  0.47  CREATININE  --   --  8.70*  --      Estimated Creatinine Clearance: 9.5 mL/min (A) (by C-G formula based on SCr of 8.7 mg/dL (H)).   Medical History: Past Medical History:  Diagnosis Date   Anemia    Blind    Blind    ESRD (end stage renal disease) ([place])    Foot ulceration ([place]) 12/2018   History of transmetatarsal amputation of left foot ([facility])    History of transmetatarsal amputation of right foot ([facility])    Hypertension    PAD (peripheral artery disease) ([facility])    Tobacco abuse     Medications:  Infusions:   ceFAZolin     heparin     vancomycin Stopped (07/13/22 1803)   Assessment: 33 YOF with atrial fibrillation. Per med history, not taking apixaban PTA. Recent bleeding from fistula ulcers requiring transfusion noted. Current hgb 8.0 trending down from 8.5, PLTs wnl. Heparin level 0.35 No issues with infusion or bleeding reported.  7/24: Post dialysis catheter procedure,patient to re-start heparin gtt at 1430 and to restart Eliquis 5mg  BID on 7/25 per vascular surgery.  Goal of Therapy:  Heparin level 0.3-0.7 units/ml Monitor platelets by anticoagulation protocol: Yes   Plan:  Continue heparin 1,400 units/hr at 1430 and discontinue 7/25 at 1000 Re-start Eliquis 5mg  BID on 7/25 at 1000 Monitor for s/sx bleeding   Sandford Craze, PharmD. [facility] Acute Care  PGY-1  07/18/2022 11:07 AM

## 2022-07-18 NOTE — Progress Notes (Signed)
Received patient in bed, alert and oriented. Informed consent signed and in chart.  Time tx completed:1501  HD treatment completed. Patient tolerated well. Catheter without signs and symptoms of complications. Patient transported back to the room, alert and orient and in no acute distress. Report given to bedside RN.  Total UF removed:3500  Medication given:Vanc  Post HD VS:184/118,73,80,16,97.6  Post HD weight: 94.5kg

## 2022-07-18 NOTE — Transfer of Care (Signed)
Immediate Anesthesia Transfer of Care Note  Patient: Rebekah Burton  Procedure(s) Performed: INSERTION OF TUNNELED DIALYSIS CATHETER (Right: Groin)  Patient Location: PACU  Anesthesia Type:MAC  Level of Consciousness: awake, alert  and oriented  Airway & Oxygen Therapy: Patient Spontanous Breathing  Post-op Assessment: Report given to RN, Post -op Vital signs reviewed and stable and Patient moving all extremities X 4  Post vital signs: Reviewed and stable  Last Vitals:  Vitals Value Taken Time  BP 137/118 07/18/22 0825  Temp    Pulse 76 07/18/22 0826  Resp 16 07/18/22 0826  SpO2 97 % 07/18/22 0826  Vitals shown include unvalidated device data.  Last Pain:  Vitals:   07/18/22 0657  TempSrc: Oral  PainSc:       Patients Stated Pain Goal: 3 (79/03/83 3383)  Complications: No notable events documented.

## 2022-07-18 NOTE — Progress Notes (Signed)
[facility]  PROGRESS NOTE  Rebekah Burton YJE:[id number] DOB: January 08, 1976 DOA: 07/07/2022 PCP: [name], NP   Brief HPI:    46 year old female with medical history of hypertension, ESRD on HD, MWF, PAD, blindness, bilateral transmetatarsal amputation, tobacco abuse came to ED with complaints of lower abdominal/pelvic pain which started on Tuesday afternoon 7/11.  Patient says that after [place] on Monday she started to have slow bleed from the fistula site of left arm on the following day, Tuesday she developed abdominal pain which was of moderate intensity and worsened with movement.  She saw her PCP who prescribed antibiotic.  Patient states that she missed dialysis on Wednesday due to severity of abdominal pain. Work-up in the hospital revealed symptomatic anemia with acute blood loss anemia from fistula ulcer, sepsis secondary to possible left-sided pyonephritis and MRSA bacteremia.  Patient underwent excision of infected left arm AV fistula and stent graft material with Dr. Stanford Breed on 7/17.  Patient suffered intraoperative cardiac arrest after placement of percutaneous temporary catheter.  ROSC achieved.  Patient completed procedure in the OR then was extubated in PACU.  She was monitored in the ICU on 7/17.   Subjective   Patient seen and examined, denies any complaints.  She was started on Cardizem for A-fib yesterday.  Cardizem has been discontinued.    Assessment/Plan:     Symptomatic anemia -Acute blood loss anemia from his fistula ulcers -She has history of anemia, disease -S/p excision of infected left arm AV fistula and stent graft on 7/17 -S/p PRBC on 7/14 -Received 2 units of PRBC for hemoglobin of 5.9 -This morning hemoglobin is 8.0  New onset atrial fibrillation -Patient went into A-fib on 07/16/2022 with heart rate 140s -Patient started on  Cardizem gtt. -Patient was on Eliquis at home for DVT which was held due to bleeding from fistula ulcers -Started  on on heparin per pharmacy; heparin has been discontinued -We will check TSH; echocardiogram from 07/14/2022 showed EF of 65% -She has converted back to normal sinus rhythm.  Cardizem weaned off. -Patient restarted on Eliquis  Sepsis secondary to possible left-sided pyelonephritis/MRSA bacteremia -Sepsis POA -Blood cultures positive for MRSA, started on vancomycin -Tissue culture from AV fistula abscess grew Staph aureus -Repeat blood cultures obtained on 7/16 is currently pending -ID following -TEE done today shows echodense mobile mass on mitral annulus likely calcification -ID recommends to continue with IV vancomycin for 6 weeks till 08/17/2022  PEA arrest -Developed PEA arrest intraoperative on 7/17 -Patient was extubated in PACU and monitored in the ICU -Transferred to progressive bed  ESRD on hemodialysis, MWF -Underwent femoral TDC per vascular surgery -Nephrology following  ?  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  Patient was started on Eliquis -Patient was discharged on Eliquis which was initially held, Eliquis has been restarted   Prolonged QT interval -QTc 502 ms -Avoid QT prolonging drugs   Pancreatic cyst -CT abdomen and pelvis showed cystic lesion of the tail of the pancreas measuring 17 mm -Nonemergent contrast-enhanced MRCP was recommended as outpatient   Essential hypertension -Blood pressure was elevated, Coreg was restarted -Continue  hydralazine as needed -Patient also takes clonidine 0.3 mg 3 times daily at home -She was started back on clonidine at low-dose of 0.1 mg p.o. 3 times daily -Dose of clonidine changed to 0.3 mg  3 times daily which is patient's home dose  GERD -Protonix   Tobacco use disorder -Patient counseled on tobacco abuse cessation   Coronary  calcification -Recent CTA chest reviewed with findings of 4 vessel coronary calcification -Ordered and reviewed 2d echo, normal LVEF with no WMA     Medications     [START ON 07/19/2022] apixaban  5 mg Oral BID   calcitRIOL  2.5 mcg Oral Q M,W,F-HD   carvedilol  25 mg Oral BID WC   Chlorhexidine Gluconate Cloth  6 each Topical Q0600   cinacalcet  90 mg Oral Q M,W,F-HD   cloNIDine  0.3 mg Oral TID   darbepoetin (ARANESP) injection - DIALYSIS  200 mcg Intravenous Q Fri-HD   lanthanum  1,000 mg Oral TID WC   multivitamin  1 tablet Oral QHS   pantoprazole  40 mg Oral Daily   polyethylene glycol  17 g Oral Daily   senna  1 tablet Oral BID   sodium chloride flush  10-40 mL Intracatheter Q12H   traZODone  150 mg Oral QHS     Data Reviewed:   CBG:  Recent Labs  Lab 07/11/22 1520 07/11/22 2306 07/12/22 0751 07/12/22 1153 07/12/22 1505  GLUCAP 160* 122* 138* 117* 115*    SpO2: 98 % O2 Flow Rate (L/min): 96 L/min    Vitals:   07/18/22 1203 07/18/22 1230 07/18/22 1248 07/18/22 1338  BP: (!) 139/104 (!) 139/100 (!) 149/103 (!) 153/96  Pulse: (!) 57 63 62 66  Resp: (!) 8 (!) 9 12 (!) 9  Temp:      TempSrc:      SpO2: 98%  97% 98%  Weight:      Height:          Data Reviewed:  Basic Metabolic Panel: Recent Labs  Lab 07/12/22 0317 07/13/22 0400 07/14/22 0540 07/15/22 0410 07/18/22 0717  NA 135 134* 134* 131* 132*  K 3.3* 3.6 3.7 4.6 4.1  CL 93* 96* 94* 92* 94*  CO2 29 28 27 23   --   GLUCOSE 130* 109* 110* 128* 93  BUN 30* 41* 48* 57* 49*  CREATININE 5.32* 6.61* 6.92* 8.51* 8.70*  CALCIUM 9.1 8.9 9.3 9.9  --   MG 1.7  --   --   --   --   PHOS 3.6  --   --   --   --     CBC: Recent Labs  Lab 07/13/22 0400 07/14/22 0540 07/15/22 0410 07/17/22 0929 07/18/22 0717 07/18/22 0920  WBC 12.0* 11.9* 12.4* 7.9  --  7.8  HGB 7.8* 7.9* 8.2* 7.6* 8.5* 8.0*  HCT 23.4* 24.4* 24.4* 23.3* 25.0* 24.6*  MCV 88.3 89.7 87.5 87.9  --  89.8  PLT 207 233 259 247  --   268    LFT Recent Labs  Lab 07/14/22 0540  AST 42*  ALT 50*  ALKPHOS 103  BILITOT 1.9*  PROT 6.4*  ALBUMIN 2.8*     Antibiotics: Anti-infectives (From admission, onward)    Start     Dose/Rate Route Frequency Ordered Stop   07/19/22 0000  vancomycin (VANCOCIN) IVPB 1000 mg/200 mL premix        1,000 mg 200 mL/hr over 60 Minutes Intravenous Every M-W-F (Hemodialysis) 07/18/22 1123 08/17/22 2359   07/18/22 0734  ceFAZolin (ANCEF) 2-4 GM/100ML-% IVPB       Note to Pharmacy: Tamala Fothergill S: cabinet override      07/18/22 0734 07/18/22 1944   07/16/22 1200  vancomycin (VANCOCIN) IVPB 1000 mg/200 mL premix        1,000 mg 200 mL/hr over 60 Minutes Intravenous Every Sat (Hemodialysis) 07/15/22 1810 07/16/22 2001   07/13/22 1200  vancomycin (VANCOCIN) IVPB 1000 mg/200 mL premix  Status:  Discontinued        1,000 mg 200 mL/hr over 60 Minutes Intravenous Every M-W-F (Hemodialysis) 07/12/22 0757 07/18/22 1123   07/11/22 1258  vancomycin variable dose per unstable renal function (pharmacist dosing)  Status:  Discontinued         Does not apply See admin instructions 07/11/22 1258 07/12/22 0757   07/11/22 1200  vancomycin (VANCOREADY) IVPB 750 mg/150 mL  Status:  Discontinued        750 mg 150 mL/hr over 60 Minutes Intravenous Every M-W-F (Hemodialysis) 07/10/22 1246 07/11/22 1252   07/11/22 0745  vancomycin (VANCOCIN) IVPB 1000 mg/200 mL premix       Note to Pharmacy: On call to OR for 07:30 start   1,000 mg 200 mL/hr over 60 Minutes Intravenous  Once 07/11/22 0736 07/11/22 0757   07/11/22 0734  vancomycin (VANCOCIN) 1-5 GM/200ML-% IVPB       Note to Pharmacy: Maude Leriche: cabinet override      07/11/22 0734 07/11/22 0806   07/11/22 0730  vancomycin (VANCOCIN) 1,000 mg in sodium chloride 0.9 % 250 mL IVPB  Status:  Discontinued       Note to Pharmacy: On call to OR for 07:30 start   1,000 mg 250 mL/hr over 60 Minutes Intravenous  Once 07/11/22 0728 07/11/22 0733    07/10/22 1345  vancomycin (VANCOREADY) IVPB 750 mg/150 mL        750 mg 150 mL/hr over 60 Minutes Intravenous  Once 07/10/22 1246 07/10/22 1453   07/09/22 0915  vancomycin (VANCOREADY) IVPB 1500 mg/300 mL        1,500 mg 150 mL/hr over 120 Minutes Intravenous  Once 07/09/22 0820 07/09/22 1146   07/08/22 0600  cefTRIAXone (ROCEPHIN) 1 g in sodium chloride 0.9 % 100 mL IVPB  Status:  Discontinued        1 g 200 mL/hr over 30 Minutes Intravenous Every 24 hours 07/08/22 0512 07/10/22 1557        DVT prophylaxis: SCDs  Code Status: Full code  Family Communication: No family at bedside   CONSULTS nephrology   Objective    Physical Examination:  General-appears in no acute distress Heart-S1-S2, regular, no murmur auscultated Lungs-clear to auscultation bilaterally, no wheezing or crackles auscultated Abdomen-soft, nontender, no organomegaly Extremities-no edema in the lower extremities Neuro-alert, oriented x3, no focal deficit noted    Status is: Inpatient: Patient on IV vancomycin;         [place]   [facility] If 7PM-7AM, please contact night-coverage at [email or web address], Office  [phone]   07/18/2022, 3:02 PM  LOS: 10 days

## 2022-07-19 ENCOUNTER — Encounter (HOSPITAL_COMMUNITY): Payer: Self-pay | Admitting: Vascular Surgery

## 2022-07-19 DIAGNOSIS — A4101 Sepsis due to Methicillin susceptible Staphylococcus aureus: Secondary | ICD-10-CM

## 2022-07-19 DIAGNOSIS — R6520 Severe sepsis without septic shock: Secondary | ICD-10-CM

## 2022-07-19 LAB — CBC
HCT: 24.1 % — ABNORMAL LOW (ref 36.0–46.0)
Hemoglobin: 8 g/dL — ABNORMAL LOW (ref 12.0–15.0)
MCH: 29.3 pg (ref 26.0–34.0)
MCHC: 33.2 g/dL (ref 30.0–36.0)
MCV: 88.3 fL (ref 80.0–100.0)
Platelets: 166 10*3/uL (ref 150–400)
RBC: 2.73 MIL/uL — ABNORMAL LOW (ref 3.87–5.11)
RDW: 18.2 % — ABNORMAL HIGH (ref 11.5–15.5)
WBC: 8 10*3/uL (ref 4.0–10.5)
nRBC: 0 % (ref 0.0–0.2)

## 2022-07-19 LAB — HEPARIN LEVEL (UNFRACTIONATED): Heparin Unfractionated: <0.1 IU/mL — ABNORMAL LOW (ref 0.30–0.70)

## 2022-07-19 MED ORDER — OXYCODONE HCL 5 MG PO TABS: 5.0000 mg | ORAL_TABLET | Freq: Three times a day (TID) | ORAL | 0 refills | Status: DC | PRN

## 2022-07-19 MED ORDER — ACETAMINOPHEN 325 MG PO TABS: 650.0000 mg | ORAL_TABLET | Freq: Four times a day (QID) | ORAL | Status: AC | PRN

## 2022-07-19 MED ORDER — VANCOMYCIN HCL IN DEXTROSE 1-5 GM/200ML-% IV SOLN: 1000.0000 mg | INTRAVENOUS | 0 refills | Status: AC

## 2022-07-19 NOTE — Progress Notes (Signed)
ANTICOAGULATION CONSULT NOTE - Follow Up  Pharmacy Consult for Eliquis  Indication: atrial fibrillation  No Known Allergies  Patient Measurements: Height: 5\' 4"  (162.6 cm) Weight: 93.2 kg (205 lb 7.5 oz) IBW/kg (Calculated) : 54.7 Heparin Dosing Weight: 77 kg  Vital Signs: Temp: 98.6 F (37 C) (07/25 0020) Temp Source: Oral (07/25 0415) BP: 101/71 (07/25 0415) Pulse Rate: 76 (07/25 0415)  Labs: Recent Labs    07/17/22 0929 07/17/22 1832 07/18/22 0717 07/18/22 0920 07/19/22 0201  HGB 7.6*  --  8.5* 8.0* 8.0*  HCT 23.3*  --  25.0* 24.6* 24.1*  PLT 247  --   --  268 166  HEPARINUNFRC 0.28* 0.35  --  0.47 <0.10*  CREATININE  --   --  8.70*  --   --      Estimated Creatinine Clearance: 9.2 mL/min (A) (by C-G formula based on SCr of 8.7 mg/dL (H)).   Medical History: Past Medical History:  Diagnosis Date   Anemia    Blind    Blind    ESRD (end stage renal disease) ([place])    Foot ulceration ([place]) 12/2018   History of transmetatarsal amputation of left foot ([facility])    History of transmetatarsal amputation of right foot ([facility])    Hypertension    PAD (peripheral artery disease) ([facility])    Tobacco abuse     Medications:  Infusions:   heparin 1,400 Units/hr (07/18/22 1734)   vancomycin     Assessment: 87 YOF with atrial fibrillation. Per med history, not taking apixaban PTA. Recent bleeding from fistula ulcers requiring transfusion noted.  Dialysis catheter placed yesterday - heparin drip restarted post-surgery with plans to restart Eliquis 5mg  BID on 7/25, per vascular surgery.  Heparin level subtherapeutic early this AM. Will plan not to adjust rate at this time as patient is to transition to Eliquis today at 1000. Patient's renal fx is borderline appropriate for dose reduction - will plan not to reduce dose at this time as creatinine may improve. No bleeding concerns noted. H&H stable but platelets trending down.   Goal of Therapy:  Reduce risk of stroke due to Afib   Monitor platelets by anticoagulation protocol: Yes   Plan:  START Eliquis 5mg  BID today at 1000 - turn off heparin drip with the first dose of Eliquis  Monitor renal function for dose appropriateness  Monitor for s/sx bleeding Continue to monitor daily CBC and platelets  Vicenta Dunning, PharmD  PGY1 Pharmacy Resident

## 2022-07-19 NOTE — Plan of Care (Signed)

## 2022-07-19 NOTE — Progress Notes (Addendum)
Spoke to [employer] at [facility]. Clinic aware that pt is to d/c today and will resume care tomorrow. Clinic also aware pt will require iv vanc with HD. Will fax d/c summary and last renal note to clinic once completed for continuation of care.   Melven Sartorius Renal Navigator [phone]  Addendum at 11:56 am: D/C summary and last renal note faxed to clinic.

## 2022-07-19 NOTE — Discharge Summary (Signed)
Physician Discharge Summary   Patient: Rebekah Burton MRN: [id number] DOB: [birth year]  Admit date:     07/07/2022  Discharge date: 07/19/22  Discharge Physician: Oswald Hillock   PCP: [name], NP   Recommendations at discharge:   Patient will be continued on vancomycin 1000 mg/200 ml every Monday Wednesday Friday with hemodialysis starting 07/20/2022 for 13 doses, end date 08/18/22 Coronary calcification seen on CT chest; follow-up cardiology as outpatient for further evaluation  Discharge Diagnoses: Principal Problem:   Symptomatic anemia Active Problems:   ESRD (end stage renal disease) ([place])   Essential hypertension   Acute blood loss anemia   Acquired abnormality of superior vena cava   Hypokalemia   Tobacco use disorder   Sepsis ([place])   Acute pyelonephritis   Hypoalbuminemia due to protein-calorie malnutrition ([place])   MRSA bacteremia  Resolved Problems:   * No resolved hospital problems. [facility] Course: 46 year old female with medical history of hypertension, ESRD on HD, MWF, PAD, blindness, bilateral transmetatarsal amputation, tobacco abuse came to ED with complaints of lower abdominal/pelvic pain which started on Tuesday afternoon 7/11.  Patient says that after [place] on Monday she started to have slow bleed from the fistula site of left arm on the following day, Tuesday she developed abdominal pain which was of moderate intensity and worsened with movement.  She saw her PCP who prescribed antibiotic.  Patient states that she missed dialysis on Wednesday due to severity of abdominal pain. Work-up in the hospital revealed symptomatic anemia with acute blood loss anemia from fistula ulcer, sepsis secondary to possible left-sided pyonephritis and MRSA bacteremia.   Patient underwent excision of infected left arm AV fistula and stent graft material with Dr. Stanford Breed on 7/17.  Patient suffered intraoperative cardiac arrest after placement of percutaneous temporary  catheter.  ROSC achieved.  Patient completed procedure in the OR then was extubated in PACU.  She was monitored in the ICU on 7/17.  Assessment and Plan:  Symptomatic anemia -Acute blood loss anemia from his fistula ulcers -She has history of anemia, disease -S/p excision of infected left arm AV fistula and stent graft on 7/17 -S/p PRBC on 7/14 -Received 2 units of PRBC for hemoglobin of 5.9 -This morning hemoglobin is 8.0   New onset atrial fibrillation -Patient went into A-fib on 07/16/2022 with heart rate 140s -Patient started on  Cardizem gtt. -Patient was on Eliquis at home for DVT which was held due to bleeding from fistula ulcers -Started on on heparin per pharmacy; heparin has been discontinued -We will check TSH; echocardiogram from 07/14/2022 showed EF of 65% -She has converted back to normal sinus rhythm.  Cardizem weaned off. -Patient restarted on Eliquis   Sepsis secondary to possible left-sided pyelonephritis/MRSA bacteremia -Sepsis POA -Blood cultures positive for MRSA, started on vancomycin -Tissue culture from AV fistula abscess grew Staph aureus -Repeat blood cultures obtained on 7/16 is currently pending -ID following -TEE done today shows echodense mobile mass on mitral annulus likely calcification -ID recommends to continue with IV vancomycin for total 6 weeks  -She will need 13 more doses of vancomycin starting 07/20/2022, last dose on 08/17/2022   PEA arrest -Developed PEA arrest intraoperative on 7/17 -Patient was extubated in PACU and monitored in the ICU -Transferred to progressive bed   ESRD on hemodialysis, MWF -Underwent femoral TDC per vascular surgery -Nephrology following   ?  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  Patient was started on Eliquis -Patient was  discharged on Eliquis which was initially held, Eliquis has been restarted     Prolonged QT interval -QTc 502 ms -Avoid QT prolonging drugs   Pancreatic cyst -CT abdomen and pelvis showed cystic lesion of the tail of the pancreas measuring 17 mm -Nonemergent contrast-enhanced MRCP was recommended as outpatient   Essential hypertension -Blood pressure was elevated, Coreg was restarted -Continue clonidine  0.3 mg 3 times daily which is patient's home dose  GERD -Protonix   Tobacco use disorder -Patient counseled on tobacco abuse cessation   Coronary calcification -Recent CTA chest reviewed with findings of 4 vessel coronary calcification -Patient is asymptomatic -Ordered and reviewed 2d echo, normal LVEF with no WMA -Need to follow-up cardiology as outpatient      Consultants: Nephrology, vascular surgery Procedures performed: Femoral TDC Disposition: Home Diet recommendation:  Discharge Diet Orders (From admission, onward)     Start     Ordered   07/19/22 0000  Diet - low sodium heart healthy        07/19/22 1139           Renal diet DISCHARGE MEDICATION: Allergies as of 07/19/2022   No Known Allergies      Medication List     STOP taking these medications    ibuprofen 600 MG tablet Commonly known as: ADVIL       TAKE these medications    acetaminophen 325 MG tablet Commonly known as: TYLENOL Take 2 tablets (650 mg total) by mouth every 6 (six) hours as needed for mild pain (or Fever >/= 101).   apixaban 5 MG Tabs tablet Commonly known as: ELIQUIS Take 5 mg by mouth 2 (two) times daily. Information provided by Group Home What changed: Another medication with the same name was removed. Continue taking this medication, and follow the directions you see here.   b complex-vitamin c-folic acid 0.8 MG Tabs tablet Take 1 tablet by mouth every Monday, Wednesday, and Friday with hemodialysis.   carvedilol 25 MG tablet Commonly known as: COREG Take 1  tablet (25 mg total) by mouth 2 (two) times daily.   cloNIDine 0.3 MG tablet Commonly known as: CATAPRES Take 1 tablet (0.3 mg total) by mouth 3 (three) times daily.   lanthanum 1000 MG chewable tablet Commonly known as: Fosrenol Chew 1 tablet (1,000 mg total) by mouth 3 (three) times daily with meals.   oxyCODONE 5 MG immediate release tablet Commonly known as: Roxicodone Take 1 tablet (5 mg total) by mouth every 8 (eight) hours as needed.   pantoprazole 40 MG tablet Commonly known as: PROTONIX Take 1 tablet (40 mg total) by mouth daily.   senna 8.6 MG Tabs tablet Commonly known as: SENOKOT Take 1 tablet (8.6 mg total) by mouth 2 (two) times daily.   traZODone 150 MG tablet Commonly known as: DESYREL Take 150 mg by mouth at bedtime.   vancomycin 1-5 GM/200ML-% Soln Commonly known as: VANCOCIN Inject 200 mLs (1,000 mg total) into the vein every Monday, Wednesday, and Friday with hemodialysis for 13 doses. Start taking on: July 20, 2022        Follow-up Information     [name], NP Follow up.   Specialty: Nurse Practitioner Contact information: [address] [place] [address] [phone]  Discharge Exam: Filed Weights   07/18/22 1036 07/18/22 1503 07/19/22 0020  Weight: 98 kg 94.5 kg 93.2 kg   General-appears in no acute distress Heart-S1-S2, regular, no murmur auscultated Lungs-clear to auscultation bilaterally, no wheezing or crackles auscultated Abdomen-soft, nontender, no organomegaly Extremities-no edema in the lower extremities Neuro-alert, oriented x3, no focal deficit noted  Condition at discharge: good  The results of significant diagnostics from this hospitalization (including imaging, microbiology, ancillary and laboratory) are listed below for reference.   Imaging Studies: DG Chest Port 1 View  Result Date: 07/18/2022 CLINICAL DATA:  Replaced right femoral catheter for dialysis EXAM: PORTABLE CHEST 1 VIEW  COMPARISON:  Radiographs 07/07/2022 CT chest 07/13/2022 FINDINGS: Partially visualized inferiorly inserted central venous catheter tip at the superior cavoatrial junction. Cardiomegaly. Hazy bilateral airspace and interstitial opacities in keeping with pulmonary edema. Aortic calcification. No acute osseous abnormality. IMPRESSION: 1. The inferiorly inserted CVC tip projects at the superior cavoatrial junction. 2. Pulmonary edema. 3. Cardiomegaly. Electronically Signed   By: Placido Sou M.D.   On: 07/18/2022 08:47   HYBRID OR IMAGING ([facility] ONLY)  Result Date: 07/18/2022 There is no interpretation for this exam.  This order is for images obtained during a surgical procedure.  Please See "Surgeries" Tab for more information regarding the procedure.   ECHO TEE  Result Date: 07/14/2022    TRANSESOPHOGEAL ECHO REPORT   Patient Name:   Rebekah Burton Date of Exam: 07/14/2022 Medical Rec #:  [id number]    Height:       64.0 in Accession #:    [id number]   Weight:       214.5 lb Date of Birth:  [birth year]    BSA:          2.015 m Patient Age:    75 years     BP:           144/93 mmHg Patient Gender: F            HR:           91 bpm. Exam Location:  Inpatient Procedure: Transesophageal Echo, 3D Echo, Color Doppler and Cardiac Doppler Indications:     Bacteremia  History:         Patient has prior history of Echocardiogram examinations, most                  recent 07/08/2022. Risk Factors:Hypertension. SVC Occlusion.  Sonographer:     Raquel Sarna Senior Referring Phys:  [id number] Margie Billet Diagnosing Phys: Berniece Salines DO PROCEDURE: After discussion of the risks and benefits of a TEE, an informed consent was obtained from the patient. The transesophogeal probe was passed without difficulty through the esophogus of the patient. Local oropharyngeal anesthetic was provided with Cetacaine. Sedation performed by different physician. The patient was monitored while under deep sedation. Anesthestetic sedation was provided  intravenously by Anesthesiology: 340mg  of Propofol. The patient's vital signs; including heart rate, blood  pressure, and oxygen saturation; remained stable throughout the procedure. The patient developed no complications during the procedure. IMPRESSIONS  1. Left ventricular ejection fraction, by estimation, is 60 to 65%. The left ventricle has normal function. The left ventricle has no regional wall motion abnormalities.  2. Right ventricular systolic function is normal. The right ventricular size is normal. There is severely elevated pulmonary artery systolic pressure.  3. No left atrial/left atrial appendage thrombus was detected.  4. Mobile echodensity at the posterior mitral annulus with severe mitral annular calcification, suspect that this mobile  echodensity is an extending of the calcification. Can not rule out a vegetation. The mitral valve is degenerative. Moderate to severe mitral valve regurgitation. Mild functional mitral stenosis. Mean gradient 3 mmHg at heart rate 90 bpm and blood pressure 134/92 mmHg. Severe mitral annular calcification.  5. Tricuspid valve regurgitation is moderate to severe.  6. The aortic valve is normal in structure. Aortic valve regurgitation is not visualized. No aortic stenosis is present.  7. There is a linear echodense mass in the SVC which does not appear to be a catheter, the SVC appears to be stenosed at the SVC-RA junction. The inferior vena cava is normal in size with greater than 50% respiratory variability, suggesting right atrial  pressure of 3 mmHg.  8. Moderate pericardial effusion. The pericardial effusion is anterior to the right ventricle. There is no evidence of cardiac tamponade.  9. There is Moderate (Grade III) layered and protruding plaque involving the descending aorta and ascending aorta. Conclusion(s)/Recommendation(s): Normal biventricular function without evidence of hemodynamically significant valvular heart disease. FINDINGS  Left Ventricle: Left  ventricular ejection fraction, by estimation, is 60 to 65%. The left ventricle has normal function. The left ventricle has no regional wall motion abnormalities. The left ventricular internal cavity size was normal in size. There is  no left ventricular hypertrophy. Right Ventricle: The right ventricular size is normal. No increase in right ventricular wall thickness. Right ventricular systolic function is normal. There is severely elevated pulmonary artery systolic pressure. Left Atrium: Left atrial size was normal in size. No left atrial/left atrial appendage thrombus was detected. Right Atrium: Right atrial size was normal in size. Pericardium: A moderately sized pericardial effusion is present. The pericardial effusion is anterior to the right ventricle. There is no evidence of cardiac tamponade. Mitral Valve: Mobile echodensity at the posterior mitral annulus with severe mitral annular calcification, suspect that this mobile echodensity is an extending of the calcification. In this clinical picture can not rule out a vegetation. The mitral valve  is degenerative in appearance. Severe mitral annular calcification. Moderate to severe mitral valve regurgitation. Mild functional mitral valve stenosis. Mean gradient 22mmHg at heart rate 90 bpm and blood pressure 134/92 mmHg. MV peak gradient, 5.4 mmHg. The mean mitral valve gradient is 3.0 mmHg. Tricuspid Valve: The tricuspid valve is normal in structure. Tricuspid valve regurgitation is moderate to severe. No evidence of tricuspid stenosis. Aortic Valve: The aortic valve is normal in structure. Aortic valve regurgitation is not visualized. No aortic stenosis is present. Pulmonic Valve: The pulmonic valve was normal in structure. Pulmonic valve regurgitation is trivial. No evidence of pulmonic stenosis. Aorta: The aortic root is normal in size and structure. There is moderate (Grade III) layered and protruding plaque involving the descending aorta and ascending  aorta. Pulmonary Artery: The pulmonary artery is not well seen. Venous: The left upper pulmonary vein, left lower pulmonary vein, right upper pulmonary vein and right lower pulmonary vein are normal. A pattern of systolic flow reversal, suggestive of severe mitral regurgitation is recorded from the left upper pulmonary vein. There is a linear echodense mass in the SVC which does not appear to be a catheter, the SVC appears to be stenosed at the SVC-RA junction. The inferior vena cava is normal in size with greater than 50% respiratory variability, suggesting right atrial pressure of 3 mmHg. IAS/Shunts: No atrial level shunt detected by color flow Doppler.  MITRAL VALVE  TRICUSPID VALVE MV Peak grad: 5.4 mmHg        TR Peak grad:   64.0 mmHg MV Mean grad: 3.0 mmHg        TR Vmax:        400.00 cm/s MV Vmax:      1.16 m/s MV Vmean:     85.3 cm/s MR Peak grad:    108.4 mmHg MR Mean grad:    67.5 mmHg MR Vmax:         520.50 cm/s MR Vmean:        384.0 cm/s MR PISA:         3.08 cm MR PISA Eff ROA: 18 mm MR PISA Radius:  0.70 cm Kardie Tobb DO Electronically signed by Berniece Salines DO Signature Date/Time: 07/14/2022/5:57:40 PM    Final    CT CHEST W CONTRAST  Result Date: 07/13/2022 CLINICAL DATA:  Central venous stenosis EXAM: CT CHEST WITH CONTRAST TECHNIQUE: Multidetector CT imaging of the chest was performed during intravenous contrast administration. RADIATION DOSE REDUCTION: This exam was performed according to the departmental dose-optimization program which includes automated exposure control, adjustment of the mA and/or kV according to patient size and/or use of iterative reconstruction technique. CONTRAST:  193mL OMNIPAQUE IOHEXOL 300 MG/ML  SOLN COMPARISON:  06/17/2022 FINDINGS: Cardiovascular: Extensive multi-vessel coronary artery calcification. Mild global cardiomegaly. Stable small pericardial effusion. The central pulmonary arteries are enlarged in keeping with changes of pulmonary  arterial hypertension. Moderate atherosclerotic calcifications seen within the thoracic aorta. No aortic aneurysm. Evaluation of the high-grade stenosis at the superior vena caval confluence is not well assessed on this examination as the terminal right subclavian vein is occluded and there is collateralization of injected contrast through numerous right body wall collaterals ultimately draining to the azygous vein and opacifying the inferior aspect of the superior vena cava at the cavoatrial junction. There is mural calcification again identified within the terminal brachiocephalic vein and proximal superior vena cava, best appreciated axial image # 41/3, similar to prior examination in keeping with chronic thrombus and stenosis at this juncture. 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 This appears similar to prior examination. Mediastinum/Nodes: The visualized thyroid is unremarkable. Numerous asymmetrically pathologically enlarged left axillary and subpectoral lymph nodes are identified, measuring up to 17 mm in short axis diameter at axial image # 40/3, slightly progressive since prior examination. Additional shotty mediastinal adenopathy is stable and may be reactive in nature. The esophagus is unremarkable. Lungs/Pleura: There is diffuse ground-glass opacity again identified throughout the lungs most suggestive of mild alveolar pulmonary edema. Small right pleural effusion is unchanged. Several pulmonary nodules have developed in the interval since prior examination distributed randomly within the lungs measuring up to 11 mm within the right upper lobe and within the left lower lobe, likely inflammatory given its relatively rapid development since prior examination. No pneumothorax. Central airways are widely patent. Upper  Abdomen: The visualized kidneys appear atrophic. Cystic lesion within the tail the pancreas measures 19 mm in dimension, stable since prior examination, not well characterized on this examination. No acute abnormality. Musculoskeletal: No acute bone abnormality. No lytic or blastic bone lesion. IMPRESSION: 1. 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 This is not optimally evaluated on this examination as the right subclavian vein is occluded centrally and the injection was made through the right upper extremity. This appears unchanged, however, since prior examination of 06/17/2022 where a complete occlusion of the superior vena cava was identified spanning roughly 1 cm above the and suggests arch. This would be better assessed, particularly if there is intention for potential recanalization, with formal venography through the left upper extremity. 2. Extensive multi-vessel coronary artery calcification. 3. Mild global cardiomegaly. Stable small pericardial effusion. 4. Stable small right pleural effusion. 5. Interval development of multiple randomly distributed pulmonary nodules measuring up to 11 mm, likely inflammatory given its relatively rapid development since prior examination. 6. Progressive left axillary adenopathy. This is asymmetric with the shotty adenopathy within the mediastinum which may be reactive in nature and may represent reactive adenopathy related to an inflammatory process within the left chest wall or left upper extremity or pathologic adenopathy. Correlation with recent mammography and clinical breast examination is recommended. 7. Stable 19 mm cystic lesion within the tail the pancreas, not well characterized on this examination. This could be better assessed  with MRI examination, if indicated. Electronically Signed   By: Fidela Salisbury M.D.   On: 07/13/2022 21:33   DG Knee 1-2 Views Left  Result Date: 07/09/2022 CLINICAL DATA:  Left knee pain, tenderness to touch. EXAM: LEFT KNEE - 1-2 VIEW COMPARISON:  None Available. FINDINGS: Portable AP and lateral views obtained. The bones are under mineralized. Mild tricompartmental peripheral spurring most prominent in the patellofemoral compartment. Subchondral cystic change in the inferior patella. No fracture. No erosion, bony destruction or evidence of focal lesion. Prepatellar soft tissue edema. No soft tissue gas, no radiopaque foreign body. Advanced for age vascular calcifications. IMPRESSION: 1. Prepatellar soft tissue edema. 2. Osteopenia/osteoporosis with mild tricompartmental degenerative change, most prominent in the patellofemoral compartment. 3. Advanced for age vascular calcifications. Electronically Signed   By: Keith Rake M.D.   On: 07/09/2022 12:51   DG Abd 1 View  Result Date: 07/09/2022 CLINICAL DATA:  Abdominal pain. EXAM: ABDOMEN - 1 VIEW COMPARISON:  07/07/2022 CT and prior studies FINDINGS: The bowel gas pattern is unremarkable. No dilated bowel loops are identified radiographically. Contrast within the rectum is noted. No acute bony abnormalities are noted. IMPRESSION: Unremarkable bowel gas pattern. Electronically Signed   By: Margarette Canada M.D.   On: 07/09/2022 12:26   ECHOCARDIOGRAM COMPLETE  Result Date: 07/08/2022    ECHOCARDIOGRAM REPORT   Patient Name:   Rebekah Burton Date of Exam: 07/08/2022 Medical Rec #:  [id number]    Height:       64.0 in Accession #:    [id number]   Weight:       222.0 lb Date of Birth:  Apr 24, 1976    BSA:          2.045 m Patient Age:    61 years     BP:           126/72 mmHg Patient Gender: F            HR:           87 bpm. Exam Location:  [facility] Procedure: 2D Echo, Cardiac Doppler and Color Doppler Indications:    CAD  History:        Patient has no prior  history of Echocardiogram examinations.  PAD; Risk Factors:Hypertension and Current Smoker.  Sonographer:    Wenda Low Referring Phys: [place]  1. Left ventricular ejection fraction, by estimation, is 65 to 70%. The left ventricle has normal function. The left ventricle has no regional wall motion abnormalities. There is severe concentric left ventricular hypertrophy. Left ventricular diastolic  parameters are consistent with Grade II diastolic dysfunction (pseudonormalization). Elevated left ventricular end-diastolic pressure.  2. Right ventricular systolic function is normal. The right ventricular size is normal. There is moderately elevated pulmonary artery systolic pressure. The estimated right ventricular systolic pressure is 16.1 mmHg.  3. Left atrial size was severely dilated.  4. Right atrial size was mildly dilated.  5. The mitral valve is degenerative. Heavy calcification of the chordae tendinae off the anterior and posterior mitral valve leaflets. Severely decreased mobility of the posterior mitral valve leaflet. Moderate mitral annular calcification. Trivial mitral valve regurgitation. Moderate mitral valve stenosis. MV peak gradient, 13.2 mmHg. The mean mitral valve gradient is 5.0 mmHg. MVA (VTI) 2.02cm2.     Trivial mitral valve regurgitation. Moderate mitral stenosis. Moderate mitral annular calcification.  6. The aortic valve is tricuspid. Aortic valve regurgitation is not visualized. Aortic valve sclerosis/calcification is present, without any evidence of aortic stenosis. Aortic valve area, by VTI measures 2.30 cm. Aortic valve mean gradient measures 7.5 mmHg. Aortic valve Vmax measures 2.09 m/s.  7. The inferior vena cava is dilated in size with >50% respiratory variability, suggesting right atrial pressure of 8 mmHg. FINDINGS  Left Ventricle: Left ventricular ejection fraction, by estimation, is 65 to 70%. The left ventricle has normal function. The  left ventricle has no regional wall motion abnormalities. The left ventricular internal cavity size was normal in size. There is  severe concentric left ventricular hypertrophy. Left ventricular diastolic parameters are consistent with Grade II diastolic dysfunction (pseudonormalization). Elevated left ventricular end-diastolic pressure. Right Ventricle: The right ventricular size is normal. No increase in right ventricular wall thickness. Right ventricular systolic function is normal. There is moderately elevated pulmonary artery systolic pressure. The tricuspid regurgitant velocity is 3.56 m/s, and with an assumed right atrial pressure of 8 mmHg, the estimated right ventricular systolic pressure is 09.6 mmHg. Left Atrium: Left atrial size was severely dilated. Right Atrium: Right atrial size was mildly dilated. Pericardium: Trivial pericardial effusion is present. The pericardial effusion is circumferential. Mitral Valve: The mitral valve is degenerative in appearance. There is Heavy calcification of the chordae tendinae off the anterior and posterior mitral valve leaflets of the mitral valve leaflet(s). Severely decreased mobility of the mitral valve leaflets. Moderate mitral annular calcification. Trivial mitral valve regurgitation. Moderate mitral valve stenosis. MV peak gradient, 13.2 mmHg. The mean mitral valve gradient is 5.0 mmHg. Tricuspid Valve: The tricuspid valve is normal in structure. Tricuspid valve regurgitation is mild . No evidence of tricuspid stenosis. Aortic Valve: The aortic valve is tricuspid. Aortic valve regurgitation is not visualized. Aortic valve sclerosis/calcification is present, without any evidence of aortic stenosis. Aortic valve mean gradient measures 7.5 mmHg. Aortic valve peak gradient measures 17.5 mmHg. Aortic valve area, by VTI measures 2.30 cm. Pulmonic Valve: The pulmonic valve was normal in structure. Pulmonic valve regurgitation is trivial. No evidence of pulmonic  stenosis. Aorta: The aortic root is normal in size and structure. Venous: The inferior vena cava is dilated in size with greater than 50% respiratory variability, suggesting right atrial pressure of 8 mmHg. IAS/Shunts: No atrial level shunt detected by color flow Doppler.  LEFT VENTRICLE PLAX  2D LVIDd:         3.50 cm     Diastology LVIDs:         2.20 cm     LV e' medial:    6.42 cm/s LV PW:         2.10 cm     LV E/e' medial:  23.4 LV IVS:        1.80 cm     LV e' lateral:   7.94 cm/s LVOT diam:     2.00 cm     LV E/e' lateral: 18.9 LV SV:         88 LV SV Index:   43 LVOT Area:     3.14 cm  LV Volumes (MOD) LV vol d, MOD A2C: 57.8 ml LV vol d, MOD A4C: 56.8 ml LV vol s, MOD A2C: 17.3 ml LV vol s, MOD A4C: 18.4 ml LV SV MOD A2C:     40.5 ml LV SV MOD A4C:     56.8 ml LV SV MOD BP:      39.1 ml RIGHT VENTRICLE RV Basal diam:  3.05 cm RV Mid diam:    3.00 cm RV S prime:     14.90 cm/s TAPSE (M-mode): 2.9 cm LEFT ATRIUM              Index        RIGHT ATRIUM           Index LA diam:        5.30 cm  2.59 cm/m   RA Area:     20.70 cm LA Vol (A2C):   139.0 ml 67.97 ml/m  RA Volume:   55.80 ml  27.28 ml/m LA Vol (A4C):   97.8 ml  47.82 ml/m LA Biplane Vol: 118.0 ml 57.70 ml/m  AORTIC VALVE                     PULMONIC VALVE AV Area (Vmax):    2.15 cm      PV Vmax:       0.65 m/s AV Area (Vmean):   2.51 cm      PV Peak grad:  1.7 mmHg AV Area (VTI):     2.30 cm AV Vmax:           209.00 cm/s AV Vmean:          118.500 cm/s AV VTI:            0.381 m AV Peak Grad:      17.5 mmHg AV Mean Grad:      7.5 mmHg LVOT Vmax:         143.00 cm/s LVOT Vmean:        94.800 cm/s LVOT VTI:          0.279 m LVOT/AV VTI ratio: 0.73  AORTA Ao Root diam: 2.80 cm MITRAL VALVE                TRICUSPID VALVE MV Area (PHT): 2.83 cm     TR Peak grad:   50.7 mmHg MV Area VTI:   2.02 cm     TR Vmax:        356.00 cm/s MV Peak grad:  13.2 mmHg MV Mean grad:  5.0 mmHg     SHUNTS MV Vmax:       1.82 m/s     Systemic VTI:  0.28 m MV  Vmean:      101.8 cm/s  Systemic Diam: 2.00 cm MV Decel Time: 268 msec MV E velocity: 150.00 cm/s MV A velocity: 101.00 cm/s MV E/A ratio:  1.49 Fransico Him MD Electronically signed by Fransico Him MD Signature Date/Time: 07/08/2022/11:35:57 AM  s    Final    US Pelvis Complete  Result Date: 07/07/2022 CLINICAL DATA:  Left lower quadrant pain. Focal abnormality seen on CT. EXAM: TRANSABDOMINAL AND TRANSVAGINAL ULTRASOUND OF PELVIS DOPPLER ULTRASOUND OF OVARIES TECHNIQUE: Both transabdominal and transvaginal ultrasound examinations of the pelvis were performed. Transabdominal technique was performed for global imaging of the pelvis including uterus, ovaries, adnexal regions, and pelvic cul-de-sac. It was necessary to proceed with endovaginal exam following the transabdominal exam to visualize the ovaries/adnexa. Color and duplex Doppler ultrasound was utilized to evaluate blood flow to the ovaries. COMPARISON:  CT today FINDINGS: Uterus Measurements: Prior hysterectomy Endometrium Thickness: Prior hysterectomy. Right ovary Measurements: Not visualized.  No adnexal mass seen. Left ovary Measurements: 3.1 x 1.9 x 2.1 cm = volume: 6.5 mL. Small follicles noted within the left ovary. Color flow visualized within the left ovary. Pulsed Doppler evaluation of the left ovary demonstrates color flow visualized. Venous flow is also documented. Unable to document/visualize arterial flow. Other findings Large amount of ascites. IMPRESSION: Left ovary is visualized. No ovarian/adnexal mass. Color flow and venous waveforms documented in the left ovary. Unable to document arterial waveform, but torsion unlikely given the venous and color flow noted. Prior hysterectomy. Large volume ascites. Electronically Signed   By: Rolm Baptise M.D.   On: 07/07/2022 18:21   US Transvaginal Non-OB  Result Date: 07/07/2022 CLINICAL DATA:  Left lower quadrant pain. Focal abnormality seen on CT. EXAM: TRANSABDOMINAL AND TRANSVAGINAL  ULTRASOUND OF PELVIS DOPPLER ULTRASOUND OF OVARIES TECHNIQUE: Both transabdominal and transvaginal ultrasound examinations of the pelvis were performed. Transabdominal technique was performed for global imaging of the pelvis including uterus, ovaries, adnexal regions, and pelvic cul-de-sac. It was necessary to proceed with endovaginal exam following the transabdominal exam to visualize the ovaries/adnexa. Color and duplex Doppler ultrasound was utilized to evaluate blood flow to the ovaries. COMPARISON:  CT today FINDINGS: Uterus Measurements: Prior hysterectomy Endometrium Thickness: Prior hysterectomy. Right ovary Measurements: Not visualized.  No adnexal mass seen. Left ovary Measurements: 3.1 x 1.9 x 2.1 cm = volume: 6.5 mL. Small follicles noted within the left ovary. Color flow visualized within the left ovary. Pulsed Doppler evaluation of the left ovary demonstrates color flow visualized. Venous flow is also documented. Unable to document/visualize arterial flow. Other findings Large amount of ascites. IMPRESSION: Left ovary is visualized. No ovarian/adnexal mass. Color flow and venous waveforms documented in the left ovary. Unable to document arterial waveform, but torsion unlikely given the venous and color flow noted. Prior hysterectomy. Large volume ascites. Electronically Signed   By: Rolm Baptise M.D.   On: 07/07/2022 18:21   [place] Art/Ven Flow Abd Pelv Doppler  Result Date: 07/07/2022 CLINICAL DATA:  Left lower quadrant pain. Focal abnormality seen on CT. EXAM: TRANSABDOMINAL AND TRANSVAGINAL ULTRASOUND OF PELVIS DOPPLER ULTRASOUND OF OVARIES TECHNIQUE: Both transabdominal and transvaginal ultrasound examinations of the pelvis were performed. Transabdominal technique was performed for global imaging of the pelvis including uterus, ovaries, adnexal regions, and pelvic cul-de-sac. It was necessary to proceed with endovaginal exam following the transabdominal exam to visualize the ovaries/adnexa. Color  and duplex Doppler ultrasound was utilized to evaluate blood flow to the ovaries. COMPARISON:  CT today FINDINGS: Uterus Measurements: Prior hysterectomy Endometrium Thickness: Prior hysterectomy. Right ovary Measurements: Not  visualized.  No adnexal mass seen. Left ovary Measurements: 3.1 x 1.9 x 2.1 cm = volume: 6.5 mL. Small follicles noted within the left ovary. Color flow visualized within the left ovary. Pulsed Doppler evaluation of the left ovary demonstrates color flow visualized. Venous flow is also documented. Unable to document/visualize arterial flow. Other findings Large amount of ascites. IMPRESSION: Left ovary is visualized. No ovarian/adnexal mass. Color flow and venous waveforms documented in the left ovary. Unable to document arterial waveform, but torsion unlikely given the venous and color flow noted. Prior hysterectomy. Large volume ascites. Electronically Signed   By: Rolm Baptise M.D.   On: 07/07/2022 18:21   CT Abdomen Pelvis W Contrast  Result Date: 07/07/2022 CLINICAL DATA:  Abdominal pain EXAM: CT ABDOMEN AND PELVIS WITH CONTRAST TECHNIQUE: Multidetector CT imaging of the abdomen and pelvis was performed using the standard protocol following bolus administration of intravenous contrast. RADIATION DOSE REDUCTION: This exam was performed according to the departmental dose-optimization program which includes automated exposure control, adjustment of the mA and/or kV according to patient size and/or use of iterative reconstruction technique. CONTRAST:  168mL OMNIPAQUE IOHEXOL 300 MG/ML  SOLN COMPARISON:  Chest CT dated June 17, 2022 FINDINGS: Lower chest: Bibasilar atelectasis. Small right pleural effusion. Cardiomegaly. Coronary artery calcifications. Hepatobiliary: No focal liver abnormality is seen. Gallstones with no gallbladder wall thickening. No biliary ductal dilation. Pancreas: No pancreatic ductal dilation. Cystic lesion of the tail of the pancreas measuring 17 mm on series 2,  image 22. Spleen: Normal in size without focal abnormality. Adrenals/Urinary Tract: Bilateral adrenal glands are unremarkable. No hydronephrosis. Heterogeneous area of the interpolar region of the left kidney. Bilateral low-attenuation renal lesions, largest are compatible with simple cysts, others are too small to completely characterize. Bladder is decompressed. Stomach/Bowel: Normal appearance of the stomach. Mildly dilated loops of distal small bowel with no definite transition point. No bowel wall thickening or inflammatory change. Normal appendix. Vascular/Lymphatic: Severe aortic atherosclerotic disease. 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 Occlusion of the partially visualized bilateral superficial femoral arteries. Pathologically enlarged lymph nodes seen in the abdomen or pelvis. Reproductive: Uterus is unremarkable. Indeterminate enhancing focus of the left pelvis measuring 2.2 cm on series 2, image 65. Other: Diffuse soft tissue anasarca. Numerous varices of the anterior abdominal wall. Small volume abdominal ascites. Musculoskeletal: Diffuse osseous sclerosis, likely sequela of chronic renal disease. IMPRESSION: 1. Numerous mildly dilated loops of small bowel with no definite transition point, concerning for early or partial small bowel obstruction. 2. Focal heterogeneous area of the interpolar region of the left kidney, possibly due to infection, although renal mass can not be excluded. Correlate with urinalysis and recommend follow-up renal protocol CT in 3 months to ensure resolution. 3. Small right pleural effusion and small volume abdominal ascites. 4. Cystic lesion of the tail of the pancreas measuring 17 mm. Recommend nonemergent contrast-enhanced MRCP for further evaluation. 5. Indeterminate enhancing focus of the left pelvis measuring 2.2 cm, likely the left ovary, although evaluation is  limited due to abdominal ascites. Finding could be further evaluated with pelvic ultrasound. 6. Severe aortic Atherosclerosis (ICD10-I70.0). 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 Occlusion of the partially visualized bilateral superficial femoral arteries. 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 Electronically Signed   By: Yetta Glassman M.D.   On: 07/07/2022 16:20  DG Chest 2 View  Result Date: 07/07/2022 CLINICAL DATA:  Shortness of breath. EXAM: CHEST - 2 VIEW COMPARISON:  Chest x-ray May 04, 2021. FINDINGS: Enlarged cardiac silhouette . Pulmonary vascular congestion. Suspected mild pulmonary edema. No confluent consolidation. No definite pleural effusions or visible pneumothorax. IMPRESSION: Cardiomegaly, pulmonary vascular congestion suspected mild pulmonary edema. Electronically Signed   By: Margaretha Sheffield M.D.   On: 07/07/2022 11:28    Microbiology: Results for orders placed or performed during the hospital encounter of 07/07/22  Culture, blood (Routine X 2) w Reflex to ID Panel     Status: Abnormal   Collection Time: 07/08/22  9:36 AM   Specimen: BLOOD  Result Value Ref Range Status   Specimen Description BLOOD RIGHT ANTECUBITAL  Final   Special Requests   Final    BOTTLES DRAWN AEROBIC AND ANAEROBIC Blood Culture results may not be optimal due to an excessive volume of blood received in culture bottles   Culture  Setup Time   Final    GRAM POSITIVE COCCI Gram Stain Report Called to,Read Back By and Verified With: MUSE,J ON 07/08/22 AT 2330 BY LOY,C AEROBIC AND ANAEROBIC BOTTLES PERFORMED AT [facility] CRITICAL RESULT CALLED TO, READ BACK BY AND VERIFIED WITH: J MUSE,RN@0623  07/09/22 [place] Performed at [facility], [place] [address], [place], [place] [address]    Culture METHICILLIN RESISTANT STAPHYLOCOCCUS AUREUS (A)  Final   Report Status 07/11/2022 FINAL  Final   Organism ID, Bacteria METHICILLIN  RESISTANT STAPHYLOCOCCUS AUREUS  Final      Susceptibility   Methicillin resistant staphylococcus aureus - MIC*    CIPROFLOXACIN <=0.5 SENSITIVE Sensitive     ERYTHROMYCIN >=8 RESISTANT Resistant     GENTAMICIN <=0.5 SENSITIVE Sensitive     OXACILLIN >=4 RESISTANT Resistant     TETRACYCLINE <=1 SENSITIVE Sensitive     VANCOMYCIN 1 SENSITIVE Sensitive     TRIMETH/SULFA <=10 SENSITIVE Sensitive     CLINDAMYCIN <=0.25 SENSITIVE Sensitive     RIFAMPIN <=0.5 SENSITIVE Sensitive     Inducible Clindamycin NEGATIVE Sensitive     * METHICILLIN RESISTANT STAPHYLOCOCCUS AUREUS  Culture, blood (Routine X 2) w Reflex to ID Panel     Status: Abnormal   Collection Time: 07/08/22  9:36 AM   Specimen: BLOOD RIGHT HAND  Result Value Ref Range Status   Specimen Description   Final    BLOOD RIGHT HAND BOTTLES DRAWN AEROBIC AND ANAEROBIC Performed at [facility], [address], [place], [place] [address]    Special Requests   Final    Blood Culture adequate volume Performed at [facility], [address] [place], [place] [address]    Culture  Setup Time   Final    GRAM POSITIVE COCCI Gram Stain Report Called to,Read Back By and Verified With: MUSE, J ON 07/08/22 AT 2330 BY LOY,C AEROBIC AND ANAEROBIC BOTTLES PERFORMED AT [facility] Performed at [facility], [address] [place], [place] [address]    Culture (A)  Final    STAPHYLOCOCCUS AUREUS SUSCEPTIBILITIES PERFORMED ON PREVIOUS CULTURE WITHIN THE LAST 5 DAYS. Performed at [facility], [place] [address], [place], [place] [address]    Report Status 07/11/2022 FINAL  Final  Blood Culture ID Panel (Reflexed)     Status: Abnormal   Collection Time: 07/08/22  9:36 AM  Result Value Ref Range Status   Enterococcus faecalis NOT DETECTED NOT DETECTED Final   Enterococcus Faecium NOT DETECTED NOT DETECTED Final   Listeria monocytogenes NOT  DETECTED NOT DETECTED Final   Staphylococcus species DETECTED (A) NOT DETECTED Final    Comment: CRITICAL  RESULT CALLED TO, READ BACK BY AND VERIFIED WITH: J MUSE,RN@0623  07/09/22 [place]    Staphylococcus aureus (BCID) DETECTED (A) NOT DETECTED Final    Comment: Methicillin (oxacillin)-resistant Staphylococcus aureus (MRSA). MRSA is predictably resistant to beta-lactam antibiotics (except ceftaroline). Preferred therapy is vancomycin unless clinically contraindicated. Patient requires contact precautions if  hospitalized. CRITICAL RESULT CALLED TO, READ BACK BY AND VERIFIED WITH: J MUSE,RN@0623  07/09/22 [place]    Staphylococcus epidermidis NOT DETECTED NOT DETECTED Final   Staphylococcus lugdunensis NOT DETECTED NOT DETECTED Final   Streptococcus species NOT DETECTED NOT DETECTED Final   Streptococcus agalactiae NOT DETECTED NOT DETECTED Final   Streptococcus pneumoniae NOT DETECTED NOT DETECTED Final   Streptococcus pyogenes NOT DETECTED NOT DETECTED Final   A.calcoaceticus-baumannii NOT DETECTED NOT DETECTED Final   Bacteroides fragilis NOT DETECTED NOT DETECTED Final   Enterobacterales NOT DETECTED NOT DETECTED Final   Enterobacter cloacae complex NOT DETECTED NOT DETECTED Final   Escherichia coli NOT DETECTED NOT DETECTED Final   Klebsiella aerogenes NOT DETECTED NOT DETECTED Final   Klebsiella oxytoca NOT DETECTED NOT DETECTED Final   Klebsiella pneumoniae NOT DETECTED NOT DETECTED Final   Proteus species NOT DETECTED NOT DETECTED Final   Salmonella species NOT DETECTED NOT DETECTED Final   Serratia marcescens NOT DETECTED NOT DETECTED Final   Haemophilus influenzae NOT DETECTED NOT DETECTED Final   Neisseria meningitidis NOT DETECTED NOT DETECTED Final   Pseudomonas aeruginosa NOT DETECTED NOT DETECTED Final   Stenotrophomonas maltophilia NOT DETECTED NOT DETECTED Final   Candida albicans NOT DETECTED NOT DETECTED Final   Candida auris NOT DETECTED NOT DETECTED Final   Candida glabrata NOT DETECTED NOT DETECTED Final   Candida krusei NOT DETECTED NOT DETECTED Final   Candida parapsilosis  NOT DETECTED NOT DETECTED Final   Candida tropicalis NOT DETECTED NOT DETECTED Final   Cryptococcus neoformans/gattii NOT DETECTED NOT DETECTED Final   Meth resistant mecA/C and MREJ DETECTED (A) NOT DETECTED Final    Comment: CRITICAL RESULT CALLED TO, READ BACK BY AND VERIFIED WITH: J MUSE,RN@0623  07/09/22 [place] Performed at [facility], [address] [place], [place] [address]   Culture, blood (Routine X 2) w Reflex to ID Panel     Status: None   Collection Time: 07/10/22  4:36 PM   Specimen: BLOOD LEFT ARM  Result Value Ref Range Status   Specimen Description BLOOD LEFT ARM  Final   Special Requests   Final    BOTTLES DRAWN AEROBIC AND ANAEROBIC Blood Culture results may not be optimal due to an inadequate volume of blood received in culture bottles   Culture   Final    NO GROWTH 5 DAYS Performed at [facility], [place] [address] [place], [place] [address]    Report Status 07/15/2022 FINAL  Final  Culture, blood (Routine X 2) w Reflex to ID Panel     Status: None   Collection Time: 07/10/22  4:36 PM   Specimen: BLOOD RIGHT WRIST  Result Value Ref Range Status   Specimen Description BLOOD RIGHT WRIST  Final   Special Requests   Final    BOTTLES DRAWN AEROBIC AND ANAEROBIC Blood Culture adequate volume   Culture   Final    NO GROWTH 5 DAYS Performed at [facility], [place] [address], [place], [place] [address]    Report Status 07/15/2022 FINAL  Final  Surgical pcr screen     Status: Abnormal   Collection Time: 07/11/22  6:06 AM   Specimen: Nasal Mucosa; Nasal Swab  Result Value Ref Range Status   MRSA, PCR POSITIVE (A) NEGATIVE Final   Staphylococcus aureus POSITIVE (A) NEGATIVE Final    Comment: RESULT CALLED TO, READ BACK BY AND VERIFIED WITH: RN SANTOS.A AT 9563 ON 07/11/2022 BY T.SAAD. (NOTE) The Xpert SA Assay (FDA approved for NASAL specimens in patients 15 years of age and older), is one component of a comprehensive surveillance program. It is not  intended to diagnose infection nor to guide or monitor treatment. Performed at [facility], [place] [address], [place], [place] [address]   Aerobic/Anaerobic Culture w Gram Stain (surgical/deep wound)     Status: None   Collection Time: 07/11/22  8:46 AM   Specimen: PATH Other; Tissue  Result Value Ref Range Status   Specimen Description TISSUE ABSCESS  Final   Special Requests LEFT ARTERIOVENOUS FISTULA ABSC  Final   Gram Stain   Final    FEW WBC PRESENT,BOTH PMN AND MONONUCLEAR NO ORGANISMS SEEN    Culture   Final    FEW METHICILLIN RESISTANT STAPHYLOCOCCUS AUREUS NO ANAEROBES ISOLATED Performed at [facility], [place] [address], [place], [place] [address]    Report Status 07/16/2022 FINAL  Final   Organism ID, Bacteria METHICILLIN RESISTANT STAPHYLOCOCCUS AUREUS  Final      Susceptibility   Methicillin resistant staphylococcus aureus - MIC*    CIPROFLOXACIN <=0.5 SENSITIVE Sensitive     ERYTHROMYCIN >=8 RESISTANT Resistant     GENTAMICIN <=0.5 SENSITIVE Sensitive     OXACILLIN >=4 RESISTANT Resistant     TETRACYCLINE <=1 SENSITIVE Sensitive     VANCOMYCIN <=0.5 SENSITIVE Sensitive     TRIMETH/SULFA <=10 SENSITIVE Sensitive     CLINDAMYCIN <=0.25 SENSITIVE Sensitive     RIFAMPIN <=0.5 SENSITIVE Sensitive     Inducible Clindamycin NEGATIVE Sensitive     * FEW METHICILLIN RESISTANT STAPHYLOCOCCUS AUREUS  Aerobic/Anaerobic Culture w Gram Stain (surgical/deep wound)     Status: None   Collection Time: 07/11/22  8:46 AM   Specimen: PATH Other; Tissue  Result Value Ref Range Status   Specimen Description ABSCESS  Final   Special Requests LEFT ARTERIOVENOUS FISTULA ABSC  Final   Gram Stain   Final    FEW WBC PRESENT,BOTH PMN AND MONONUCLEAR RARE GRAM POSITIVE COCCI    Culture   Final    FEW METHICILLIN RESISTANT STAPHYLOCOCCUS AUREUS NO ANAEROBES ISOLATED Performed at [facility], [address] [place], [place] [address]    Report Status 07/16/2022 FINAL   Final   Organism ID, Bacteria METHICILLIN RESISTANT STAPHYLOCOCCUS AUREUS  Final      Susceptibility   Methicillin resistant staphylococcus aureus - MIC*    CIPROFLOXACIN <=0.5 SENSITIVE Sensitive     ERYTHROMYCIN >=8 RESISTANT Resistant     GENTAMICIN <=0.5 SENSITIVE Sensitive     OXACILLIN >=4 RESISTANT Resistant     TETRACYCLINE <=1 SENSITIVE Sensitive     VANCOMYCIN <=0.5 SENSITIVE Sensitive     TRIMETH/SULFA <=10 SENSITIVE Sensitive     CLINDAMYCIN <=0.25 SENSITIVE Sensitive     RIFAMPIN <=0.5 SENSITIVE Sensitive     Inducible Clindamycin NEGATIVE Sensitive     * FEW METHICILLIN RESISTANT STAPHYLOCOCCUS AUREUS    Labs: CBC: Recent Labs  Lab 07/14/22 0540 07/15/22 0410 07/17/22 0929 07/18/22 0717 07/18/22 0920 07/19/22 0201  WBC 11.9* 12.4* 7.9  --  7.8 8.0  HGB 7.9* 8.2* 7.6* 8.5* 8.0* 8.0*  HCT 24.4* 24.4* 23.3* 25.0* 24.6* 24.1*  MCV 89.7 87.5 87.9  --  89.8 88.3  PLT 233 259 247  --  268 591   Basic Metabolic Panel: Recent Labs  Lab 07/13/22 0400 07/14/22 0540 07/15/22 0410 07/18/22 0717  NA 134* 134* 131* 132*  K 3.6 3.7 4.6 4.1  CL 96* 94* 92* 94*  CO2 28 27 23   --   GLUCOSE 109* 110* 128* 93  BUN 41* 48* 57* 49*  CREATININE 6.61* 6.92* 8.51* 8.70*  CALCIUM 8.9 9.3 9.9  --    Liver Function Tests: Recent Labs  Lab 07/14/22 0540  AST 42*  ALT 50*  ALKPHOS 103  BILITOT 1.9*  PROT 6.4*  ALBUMIN 2.8*   CBG: Recent Labs  Lab 07/12/22 1153 07/12/22 1505  GLUCAP 117* 115*    Discharge time spent: greater than 30 minutes.  Signed: Oswald Hillock, MD [facility] 07/19/2022

## 2022-07-19 NOTE — Progress Notes (Signed)
[facility] Progress Note   Subjective:   Patient seen and examined at bedside. Tolerated dialysis well yesterday using new R femoral TDC.  Complains of intermittent pain in b/l LE.  Denies CP, SOB, abdominal pain and n/v/d.    Objective Vitals:   07/19/22 0020 07/19/22 0415 07/19/22 0803 07/19/22 1114  BP: 103/70 101/71 110/83 121/78  Pulse: 84 76 79 72  Resp: 15 18 (!) 30 13  Temp: 98.6 F (37 C)  98.2 F (36.8 C) 98.5 F (36.9 C)  TempSrc: Oral Oral Oral Oral  SpO2: 93% 92% 94% 93%  Weight: 93.2 kg     Height:       Physical Exam General:chronically ill appearing, blind female in NAD Heart:RRR, no mrg Lungs:mostly CTAB, decreased in b/l bases, nml WOB on RA Abdomen:soft, NTND Extremities:no LE edema Dialysis Access: R femoral [facility]   Filed Weights   07/18/22 1036 07/18/22 1503 07/19/22 0020  Weight: 98 kg 94.5 kg 93.2 kg    Intake/Output Summary (Last 24 hours) at 07/19/2022 1122 Last data filed at 07/19/2022 0900 Gross per 24 hour  Intake 324.15 ml  Output 3500 ml  Net -3175.85 ml    Additional Objective Labs: Basic Metabolic Panel: Recent Labs  Lab 07/13/22 0400 07/14/22 0540 07/15/22 0410 07/18/22 0717  NA 134* 134* 131* 132*  K 3.6 3.7 4.6 4.1  CL 96* 94* 92* 94*  CO2 28 27 23   --   GLUCOSE 109* 110* 128* 93  BUN 41* 48* 57* 49*  CREATININE 6.61* 6.92* 8.51* 8.70*  CALCIUM 8.9 9.3 9.9  --    Liver Function Tests: Recent Labs  Lab 07/14/22 0540  AST 42*  ALT 50*  ALKPHOS 103  BILITOT 1.9*  PROT 6.4*  ALBUMIN 2.8*   CBC: Recent Labs  Lab 07/14/22 0540 07/15/22 0410 07/17/22 0929 07/18/22 0717 07/18/22 0920 07/19/22 0201  WBC 11.9* 12.4* 7.9  --  7.8 8.0  HGB 7.9* 8.2* 7.6* 8.5* 8.0* 8.0*  HCT 24.4* 24.4* 23.3* 25.0* 24.6* 24.1*  MCV 89.7 87.5 87.9  --  89.8 88.3  PLT 233 259 247  --  268 166   CBG: Recent Labs  Lab 07/12/22 1153 07/12/22 1505  GLUCAP 117* 115*    Studies/Results: DG Chest Port 1  View  Result Date: 07/18/2022 CLINICAL DATA:  Replaced right femoral catheter for dialysis EXAM: PORTABLE CHEST 1 VIEW COMPARISON:  Radiographs 07/07/2022 CT chest 07/13/2022 FINDINGS: Partially visualized inferiorly inserted central venous catheter tip at the superior cavoatrial junction. Cardiomegaly. Hazy bilateral airspace and interstitial opacities in keeping with pulmonary edema. Aortic calcification. No acute osseous abnormality. IMPRESSION: 1. The inferiorly inserted CVC tip projects at the superior cavoatrial junction. 2. Pulmonary edema. 3. Cardiomegaly. Electronically Signed   By: Placido Sou M.D.   On: 07/18/2022 08:47   HYBRID OR IMAGING ([facility] ONLY)  Result Date: 07/18/2022 There is no interpretation for this exam.  This order is for images obtained during a surgical procedure.  Please See "Surgeries" Tab for more information regarding the procedure.    Medications:  vancomycin      apixaban  5 mg Oral BID   calcitRIOL  2.5 mcg Oral Q M,W,F-HD   carvedilol  25 mg Oral BID WC   Chlorhexidine Gluconate Cloth  6 each Topical Q0600   cinacalcet  90 mg Oral Q M,W,F-HD   cloNIDine  0.3 mg Oral TID   darbepoetin (ARANESP) injection - DIALYSIS  200 mcg Intravenous Q Fri-HD  lanthanum  1,000 mg Oral TID WC   multivitamin  1 tablet Oral QHS   pantoprazole  40 mg Oral Daily   polyethylene glycol  17 g Oral Daily   senna  1 tablet Oral BID   sodium chloride flush  10-40 mL Intracatheter Q12H   traZODone  150 mg Oral QHS    Dialysis Orders: [facility].  4 hours 15 minutes.  MWF.  EDW 102 kg.  Revaclear 300.  400/500.  2K, 2.5 Cal.  Meds: Calcitriol 2.5 mcg 3 times weekly, Sensipar 90 mg 3 times weekly, Mircera 200 mg every 2 weeks (last dose on Monday 7/10).  No heparin     Assessment/Plan: 1. Sepsis/MRSA bacteremia - BC+7/14, WC+ 7/17, TEE 7/20 with no signs endocarditis.  Excision of infected LU AVG 7/17 with temp cath placement.  New L thigh TDC  placed today by Dr. Scot Dock. ID  following recommend Vancomycin until 08/17/22.   2. Brief bradycardic arrest - intraoperative.  3. New onset A fib - started on Cardizem drip yesterday & weaned off. Now back NSR.  Eliquis restarted.  4. Vascular access - Infected LU AVF w/ulceration s/p excision on 7/17 by Dr. Stanford Breed. Femoral TDC placed 7/25 by Dr. Scot Dock d/t SVC occlusion.  5. Abdominal pain - resolved.  6. ESRD - on HD MWF.  HD tomorrow per regular schedule.  7. Anemia of CKD- Hgb 8.0. No iron in setting of bacteremia.  ESA given on 7/21. 8. Secondary hyperparathyroidism - Ca in goal.  Continue calcitriol and sensipar.  Phos in goal with fosrenol.  9. HTN/volume - BP variable, on carvedilol 25mg  BID and clonidine 0.3mg  TID.  CXR 7/24 with pulmonary edema.  Does not appear grossly volume overloaded.  On RA.  Net UF 2.3L yesterday.  Under EDW, suspect weight loss and will need new dry weight on d/c. 10. Nutrition - Renal diet w/fluids restrictions.  11. B/l leg pain - intermittent. Per PMD. 12. Dispo - ok for d/c from renal standpoint.  HD clinic aware of need for Vancomycin, with dose and end day to be faxed on discharge.   Jen Mow, PA-C [facility] 07/19/2022,11:22 AM  LOS: 11 days

## 2022-07-19 NOTE — TOC Transition Note (Addendum)
Transition of Care [facility]) - CM/SW Discharge Note   Patient Details  Name: Rebekah Burton MRN: [id number] Date of Birth: 1976/09/21  Transition of Care [facility]) CM/SW Contact:  Milas Gain, [place] Phone Number: 07/19/2022, 1:07 PM   Clinical Narrative:      Jamas Lav with group home request patient to transport by PTAR.  Patient will DC to: [place] [id number]  Anticipated DC date: 07/19/2022  Family notified: Jamas Lav with Group Home  Transport by: Corey Harold  ?  Per MD patient ready for DC to Group Home/[facility] . RN, renal navigator,patient, and facility notified of DC. Discharge Summary and FL2 sent to facility. RN given number for report tele# [phone]. DC packet on chart. Ambulance transport requested for patient.  CSW signing off.   Final next level of care: Group Home Barriers to Discharge: No Barriers Identified   Patient Goals and CMS Choice Patient states their goals for this hospitalization and ongoing recovery are:: Group Home CMS Medicare.gov Compare Post Acute Care list provided to:: Patient Represenative (must comment) (patinet and marlene) Choice offered to / list presented to : Patient (patient and marlene)  Discharge Placement              Patient chooses bed at:  (Group Home) Patient to be transferred to facility by: [place] Name of family member notified: Jamas Lav Patient and family notified of of transfer: 07/19/22  Discharge Plan and Services In-house Referral: Clinical Social Work                                   Social Determinants of Health ([place]) Interventions     Readmission Risk Interventions    03/16/2021    2:06 PM  Readmission Risk Prevention Plan  Transportation Screening Complete  PCP or Specialist Appt within 5-7 Days Complete  Home Care Screening Complete  Medication Review (RN CM) Complete

## 2022-07-19 NOTE — NC FL2 (Addendum)
[place] LEVEL OF CARE SCREENING TOOL     IDENTIFICATION  Patient Name: Rebekah Burton Birthdate: Nov 27, 1976 Sex: female Admission Date (Current Location): 07/07/2022  [place] and [place] Number:  [name] [phone] [place] and Address:  [facility], [place] [address] [place], [place] [address]      Provider Number: [id number]  Attending Physician Name and Address:  Oswald Hillock, MD  Relative Name and Phone Number:       Current Level of Care: Hospital Recommended Level of Care:  ([place]) Prior Approval Number:    Date Approved/Denied:   PASRR Number:    Discharge Plan: Other (Comment) (Group Home)    Current Diagnoses: Patient Active Problem List   Diagnosis Date Noted   MRSA bacteremia 07/12/2022   Symptomatic anemia 07/08/2022   Sepsis ([place]) 07/08/2022   Acute pyelonephritis 07/08/2022   Hypoalbuminemia due to protein-calorie malnutrition ([place]) 07/08/2022   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   Hypokalemia 06/18/2022   Tobacco use disorder 06/18/2022   GERD (gastroesophageal reflux disease) 92/44/6286   Complication of vascular access for dialysis    Poor social situation    Left breast abscess    Breast abscess 01/15/2021   COVID-19 virus infection 01/15/2021   Breast abscess of female 01/15/2021   Cellulitis of left lower extremity 02/05/2019   Post-operative pain    Acute blood loss anemia    S/P transmetatarsal amputation of foot, left ([place])    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/16/2019   ESRD (end stage renal disease) ([place]) 01/16/2019   Essential hypertension 01/16/2019   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   PAD (peripheral artery disease) ([place]) 01/16/2019    Orientation RESPIRATION BLADDER Height & Weight     Self, Time, Situation, Place (WDL)  Normal Continent Weight: 205 lb 7.5 oz (93.2 kg) Height:  5\' 4"  (162.6 cm)  BEHAVIORAL SYMPTOMS/MOOD NEUROLOGICAL BOWEL  NUTRITION STATUS      Continent Diet (Renal Diet Fluid Restrictions)  AMBULATORY STATUS COMMUNICATION OF NEEDS Skin   Limited  Verbally Other (Comment) (Appropriate for ethnicity,dry,ecchymosis,arm,bilateral,non-tenting,Incision closed,groin,R,Incision closed,arm,L,Incision closed,groin.R,gauze,clean.dry,intact)                       Personal Care Assistance Level of Assistance  Bathing, Feeding, Dressing Bathing Assistance: Maximum assistance Feeding assistance: Limited assistance (able to feed self;Needs set up) Dressing Assistance: Maximum assistance     Functional Limitations Info  Sight, Hearing, Speech Sight Info: Impaired (Blind L eye,Blind R eye) Hearing Info: Adequate Speech Info: Adequate    SPECIAL CARE FACTORS FREQUENCY  PT (By licensed PT), OT (By licensed OT)     PT Frequency: 3x min weekly OT Frequency: 3x min weekly            Contractures Contractures Info: Not present    Additional Factors Info  Code Status, Allergies, Psychotropic, Isolation Precautions Code Status Info: FULL Allergies Info: No Known Allergies Psychotropic Info: traZODone (DESYREL) tablet 150 mg daily at bedtime   Isolation Precautions Info: MRSA added 07/10/2022     TAKE these medications     acetaminophen 325 MG tablet Commonly known as: TYLENOL Take 2 tablets (650 mg total) by mouth every 6 (six) hours as needed for mild pain (or Fever >/= 101).    apixaban 5 MG Tabs tablet Commonly known as: ELIQUIS Take 5 mg by mouth 2 (two) times daily. Information provided by Group Home What changed: Another medication  with the same name was removed. Continue taking this medication, and follow the directions you see here.    b complex-vitamin c-folic acid 0.8 MG Tabs tablet Take 1 tablet by mouth every Monday, Wednesday, and Friday with hemodialysis.    carvedilol 25 MG tablet Commonly known as: COREG Take 1 tablet (25 mg total) by mouth 2 (two) times daily.    cloNIDine 0.3 MG  tablet Commonly known as: CATAPRES Take 1 tablet (0.3 mg total) by mouth 3 (three) times daily.    lanthanum 1000 MG chewable tablet Commonly known as: Fosrenol Chew 1 tablet (1,000 mg total) by mouth 3 (three) times daily with meals.    oxyCODONE 5 MG immediate release tablet Commonly known as: Roxicodone Take 1 tablet (5 mg total) by mouth every 8 (eight) hours as needed.    pantoprazole 40 MG tablet Commonly known as: PROTONIX Take 1 tablet (40 mg total) by mouth daily.    senna 8.6 MG Tabs tablet Commonly known as: SENOKOT Take 1 tablet (8.6 mg total) by mouth 2 (two) times daily.    traZODone 150 MG tablet Commonly known as: DESYREL Take 150 mg by mouth at bedtime.    vancomycin 1-5 GM/200ML-% Soln Commonly known as: VANCOCIN Inject 200 mLs (1,000 mg total) into the vein every Monday, Wednesday, and Friday with hemodialysis for 13 doses. Start taking on: July 20, 2022   Relevant Imaging Results:  Relevant Lab Results:   Additional Information SSN-[id number]  Milas Gain, LCSWA

## 2022-07-25 ENCOUNTER — Telehealth: Payer: Self-pay

## 2022-07-25 NOTE — Telephone Encounter (Signed)
Joy ([place]) at [facility] in [place] called stating that the pt was asking when her staples were going to be removed.  Reviewed pt's chart, returned call, two identifiers used. Since pt will be a f/u with Dr Donnetta Hutching and she lives in [place] in a group home relying on transportation, an appt was made for 8/2 at [place] office for staple removal. Pt c/o itching and pain with dressing changes. Joy called pt and confirmed understanding.

## 2022-07-27 ENCOUNTER — Ambulatory Visit (INDEPENDENT_AMBULATORY_CARE_PROVIDER_SITE_OTHER): Payer: Medicare HMO | Admitting: Vascular Surgery

## 2022-07-27 ENCOUNTER — Encounter: Payer: Self-pay | Admitting: Vascular Surgery

## 2022-07-27 VITALS — BP 192/149 | HR 68 | Temp 97.5°F | Resp 18 | Ht 64.0 in | Wt 205.6 lb

## 2022-07-27 DIAGNOSIS — N186 End stage renal disease: Secondary | ICD-10-CM

## 2022-07-27 DIAGNOSIS — Z992 Dependence on renal dialysis: Secondary | ICD-10-CM

## 2022-07-27 NOTE — Progress Notes (Signed)
[facility] [place]  Patient name: Rebekah Burton MRN: [id number] DOB: [birth year] Sex: female  REASON FOR VISIT: Follow-up recent excision of left arm AV access for abscess with MRSA  HPI: Rebekah Burton is a 46 y.o. female here today for follow-up and staple remover.  She presented to [facility] with sepsis.  She had bleeding from erosion and a left upper arm AV access.  She was taken to the operating room by Dr. Stanford Breed for excision of this and had a temporary catheter placed.  She actually had cardiac arrest in the operating room and was successfully resuscitated.  He excised the involved portion of the access and closed her with staples.  She is here today for removal of this.  She reports continued discomfort in her left upper arm.  She also reports swelling and discomfort in her right arm.  She is here today with her caregiver.  In reviewing her chart.  This left arm access was her only prior access.  This was initially placed in [facility] a decade ago.  She had had interventions including percutaneous thrombectomy by interventional radiology in 2021.  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  Despite this she continues to have access use in her left upper arm.  She now has a right thigh tunneled catheter.  Current Outpatient Medications  Medication Sig Dispense Refill   acetaminophen (TYLENOL) 325 MG tablet Take 2 tablets (650 mg total) by mouth every 6 (six) hours as needed for mild pain (or Fever >/= 101).     apixaban (ELIQUIS) 5 MG TABS tablet Take 5 mg by mouth 2 (two) times daily. Information provided by Group Home     b complex-vitamin c-folic acid (NEPHRO-VITE) 0.8 MG TABS tablet Take 1 tablet by mouth every Monday, Wednesday, and Friday with hemodialysis. 30 tablet 3   carvedilol (COREG) 25 MG tablet Take 1 tablet (25 mg total) by mouth 2 (two) times daily. 60  tablet 3   cloNIDine (CATAPRES) 0.3 MG tablet Take 1 tablet (0.3 mg total) by mouth 3 (three) times daily. 90 tablet 3   lanthanum (FOSRENOL) 1000 MG chewable tablet Chew 1 tablet (1,000 mg total) by mouth 3 (three) times daily with meals. 90 tablet 3   ondansetron (ZOFRAN) 4 MG tablet Take 4 mg by mouth every 8 (eight) hours as needed.     oxyCODONE (ROXICODONE) 5 MG immediate release tablet Take 1 tablet (5 mg total) by mouth every 8 (eight) hours as needed. 20 tablet 0   pantoprazole (PROTONIX) 40 MG tablet Take 1 tablet (40 mg total) by mouth daily. 30 tablet 3   senna (SENOKOT) 8.6 MG TABS tablet Take 1 tablet (8.6 mg total) by mouth 2 (two) times daily. 120 tablet 0   traZODone (DESYREL) 150 MG tablet Take 150 mg by mouth at bedtime.     vancomycin (VANCOCIN) 1-5 GM/200ML-% SOLN Inject 200 mLs (1,000 mg total) into the vein every Monday, Wednesday, and Friday with hemodialysis for 13 doses. 2600 mL 0   No current facility-administered medications for this visit.     PHYSICAL EXAM: Vitals:   07/27/22 1556  BP: (!) 192/149  Pulse: 68  Resp: 18  Temp: (!) 97.5 F (36.4 C)  TempSrc: Temporal  SpO2: 95%  Weight: 205 lb 9.6 oz (93.3 kg)  Height: 5\' 4"  (1.626 m)    GENERAL: The patient is a well-nourished female, in no acute  distress. The vital signs are documented above. The area in her left upper arm is healing.  I removed her staples today.  He does have obvious collaterals over her chest wall and swelling in both arms and her face  MEDICAL ISSUES: She reports that she is continue to have vancomycin with her hemodialysis treatments for 11 more sessions.  We will continue use of her femoral catheter.  I explained that the presence of her superior vena cava will occlusion makes upper extremity access not possible.  Of note she had a left femoral endarterectomy and vein patch and left femoral to popliteal vein bypass by myself when she had presented with gangrene of her left foot in  2020.  She had transmetatarsal amputation at that same setting and had healed this with no difficulty.  This obviously makes it impossible for her to have a left femoral loop graft.  I did explain all this to the patient explaining that she will be quite challenging to have future access planned.  She would potentially be a candidate for right femoral loop graft but has catheter in place.  I would recommend continued use for catheter for at least an intermediate period of time.   Rosetta Posner, MD FACS [facility] [phone]  Note: Portions of this report may have been transcribed using voice recognition software.  Every effort has been made to ensure accuracy; however, inadvertent computerized transcription errors may still be present.

## 2022-08-07 ENCOUNTER — Other Ambulatory Visit: Payer: Self-pay

## 2022-08-07 ENCOUNTER — Emergency Department (HOSPITAL_COMMUNITY): Payer: Medicare HMO

## 2022-08-07 ENCOUNTER — Emergency Department (HOSPITAL_COMMUNITY)
Admission: EM | Admit: 2022-08-07 | Discharge: 2022-08-07 | Disposition: A | Discharge status: Home / Self Care | Payer: Medicare HMO | Attending: Emergency Medicine | Admitting: Emergency Medicine

## 2022-08-07 ENCOUNTER — Encounter (HOSPITAL_COMMUNITY): Payer: Self-pay

## 2022-08-07 DIAGNOSIS — N186 End stage renal disease: Secondary | ICD-10-CM | POA: Insufficient documentation

## 2022-08-07 DIAGNOSIS — Z992 Dependence on renal dialysis: Secondary | ICD-10-CM | POA: Diagnosis not present

## 2022-08-07 DIAGNOSIS — I120 Hypertensive chronic kidney disease with stage 5 chronic kidney disease or end stage renal disease: Secondary | ICD-10-CM | POA: Diagnosis not present

## 2022-08-07 DIAGNOSIS — Z7901 Long term (current) use of anticoagulants: Secondary | ICD-10-CM | POA: Insufficient documentation

## 2022-08-07 DIAGNOSIS — D649 Anemia, unspecified: Secondary | ICD-10-CM | POA: Diagnosis not present

## 2022-08-07 DIAGNOSIS — M7989 Other specified soft tissue disorders: Secondary | ICD-10-CM | POA: Insufficient documentation

## 2022-08-07 DIAGNOSIS — K5900 Constipation, unspecified: Secondary | ICD-10-CM | POA: Insufficient documentation

## 2022-08-07 DIAGNOSIS — I10 Essential (primary) hypertension: Secondary | ICD-10-CM

## 2022-08-07 DIAGNOSIS — Z79899 Other long term (current) drug therapy: Secondary | ICD-10-CM | POA: Insufficient documentation

## 2022-08-07 DIAGNOSIS — R109 Unspecified abdominal pain: Secondary | ICD-10-CM | POA: Diagnosis present

## 2022-08-07 LAB — CBC WITH DIFFERENTIAL/PLATELET
Abs Immature Granulocytes: 0.03 10*3/uL (ref 0.00–0.07)
Basophils Absolute: 0 10*3/uL (ref 0.0–0.1)
Basophils Relative: 1 %
Eosinophils Absolute: 0.3 10*3/uL (ref 0.0–0.5)
Eosinophils Relative: 5 %
HCT: 33.9 % — ABNORMAL LOW (ref 36.0–46.0)
Hemoglobin: 10.2 g/dL — ABNORMAL LOW (ref 12.0–15.0)
Immature Granulocytes: 1 %
Lymphocytes Relative: 8 %
Lymphs Abs: 0.4 10*3/uL — ABNORMAL LOW (ref 0.7–4.0)
MCH: 27.6 pg (ref 26.0–34.0)
MCHC: 30.1 g/dL (ref 30.0–36.0)
MCV: 91.9 fL (ref 80.0–100.0)
Monocytes Absolute: 0.5 10*3/uL (ref 0.1–1.0)
Monocytes Relative: 9 %
Neutro Abs: 4.3 10*3/uL (ref 1.7–7.7)
Neutrophils Relative %: 76 %
Platelets: 238 10*3/uL (ref 150–400)
RBC: 3.69 MIL/uL — ABNORMAL LOW (ref 3.87–5.11)
RDW: 18 % — ABNORMAL HIGH (ref 11.5–15.5)
WBC: 5.5 10*3/uL (ref 4.0–10.5)
nRBC: 0 % (ref 0.0–0.2)

## 2022-08-07 LAB — COMPREHENSIVE METABOLIC PANEL
ALT: 7 U/L (ref 0–44)
AST: 11 U/L — ABNORMAL LOW (ref 15–41)
Albumin: 3.3 g/dL — ABNORMAL LOW (ref 3.5–5.0)
Alkaline Phosphatase: 70 U/L (ref 38–126)
Anion gap: 12 (ref 5–15)
BUN: 28 mg/dL — ABNORMAL HIGH (ref 6–20)
CO2: 25 mmol/L (ref 22–32)
Calcium: 9.9 mg/dL (ref 8.9–10.3)
Chloride: 97 mmol/L — ABNORMAL LOW (ref 98–111)
Creatinine, Ser: 8.96 mg/dL — ABNORMAL HIGH (ref 0.44–1.00)
GFR, Estimated: 5 mL/min — ABNORMAL LOW (ref >60)
Glucose, Bld: 108 mg/dL — ABNORMAL HIGH (ref 70–99)
Potassium: 3.6 mmol/L (ref 3.5–5.1)
Sodium: 134 mmol/L — ABNORMAL LOW (ref 135–145)
Total Bilirubin: 1.2 mg/dL (ref 0.3–1.2)
Total Protein: 7.2 g/dL (ref 6.5–8.1)

## 2022-08-07 MED ORDER — BISACODYL 10 MG RE SUPP
10.0000 mg | Freq: Once | RECTAL | Status: AC
  Administered 2022-08-07: 10 mg via RECTAL
  Filled 2022-08-07: qty 1

## 2022-08-07 NOTE — ED Provider Notes (Signed)
[facility] Provider Note   CSN: [id number] Arrival date & time: 08/07/22  1438     History {Add pertinent medical, surgical, social history, OB history to HPI:1} Chief Complaint  Patient presents with   Constipation    Rebekah Burton is a 46 y.o. female.   Constipation Associated symptoms: abdominal pain   Associated symptoms: no fever, no nausea and no vomiting         Rebekah Burton is a 46 y.o. female with past medical history of hypertension, blind, peripheral vascular disease, end-stage renal disease on dialysis, Monday Wednesday Friday who resides at [facility] who presents to the Emergency Department complaining of constipation x6 days.  She has taken senna daily without relief.  Reports having flatus, fullness in cramping of her lower abdomen.  Denies any fever, chills, nausea or vomiting.  She was admitted to the hospital in July with symptomatic anemia and sepsis secondary to left-sided pyelonephritis and MRSA bacteremia.  She underwent excision of AV fistula of the left arm and a temporary catheter was placed.  She is currently receiving vancomycin with her hemodialysis treatments.  She is complaining of swelling of her right arm that she states has been present since discharge from the hospital.  She denies any chest pain or shortness of breath.  She states that her temporary catheter is working properly.  She is scheduled for dialysis treatment tomorrow.   Home Medications Prior to Admission medications   Medication Sig Start Date End Date Taking? Authorizing Provider  acetaminophen (TYLENOL) 325 MG tablet Take 2 tablets (650 mg total) by mouth every 6 (six) hours as needed for mild pain (or Fever >/= 101). 07/19/22   Oswald Hillock, MD  apixaban (ELIQUIS) 5 MG TABS tablet Take 5 mg by mouth 2 (two) times daily. Information provided by [facility]    [provider]  b complex-vitamin c-folic acid (NEPHRO-VITE) 0.8 MG TABS tablet  Take 1 tablet by mouth every Monday, Wednesday, and Friday with hemodialysis. 03/17/21   Samella Parr, NP  carvedilol (COREG) 25 MG tablet Take 1 tablet (25 mg total) by mouth 2 (two) times daily. 03/17/21   Samella Parr, NP  cloNIDine (CATAPRES) 0.3 MG tablet Take 1 tablet (0.3 mg total) by mouth 3 (three) times daily. 03/17/21   Samella Parr, NP  lanthanum (FOSRENOL) 1000 MG chewable tablet Chew 1 tablet (1,000 mg total) by mouth 3 (three) times daily with meals. 03/18/21   Samella Parr, NP  ondansetron (ZOFRAN) 4 MG tablet Take 4 mg by mouth every 8 (eight) hours as needed. 07/26/22   [provider]  oxyCODONE (ROXICODONE) 5 MG immediate release tablet Take 1 tablet (5 mg total) by mouth every 8 (eight) hours as needed. 07/19/22 07/19/23  Oswald Hillock, MD  pantoprazole (PROTONIX) 40 MG tablet Take 1 tablet (40 mg total) by mouth daily. 03/17/21   Samella Parr, NP  senna (SENOKOT) 8.6 MG TABS tablet Take 1 tablet (8.6 mg total) by mouth 2 (two) times daily. 03/17/21   Samella Parr, NP  traZODone (DESYREL) 150 MG tablet Take 150 mg by mouth at bedtime. 05/01/21   [provider]  vancomycin (VANCOCIN) 1-5 GM/200ML-% SOLN Inject 200 mLs (1,000 mg total) into the vein every Monday, Wednesday, and Friday with hemodialysis for 13 doses. 07/20/22 08/18/22  Oswald Hillock, MD      Allergies    Patient has no known allergies.  Review of Systems   Review of Systems  Constitutional:  Negative for appetite change, chills and fever.  Respiratory:  Negative for cough and shortness of breath.   Cardiovascular:  Negative for chest pain and leg swelling.  Gastrointestinal:  Positive for abdominal pain and constipation. Negative for abdominal distention, nausea and vomiting.  Musculoskeletal:  Negative for arthralgias.  Neurological:  Negative for dizziness, syncope, weakness and headaches.    Physical Exam Updated Vital Signs BP (!) 150/110 (BP Location: Left Leg)   Pulse  69   Temp 98.2 F (36.8 C) (Oral)   Resp 18   Ht 5\' 4"  (1.626 m)   Wt 85.7 kg   SpO2 97%   BMI 32.44 kg/m  Physical Exam Vitals and nursing note reviewed.  Constitutional:      General: She is not in acute distress.    Appearance: Normal appearance. She is not toxic-appearing.  Cardiovascular:     Rate and Rhythm: Normal rate and regular rhythm.     Pulses: Normal pulses.  Pulmonary:     Effort: Pulmonary effort is normal. No respiratory distress.  Abdominal:     General: There is no distension.     Palpations: Abdomen is soft.     Tenderness: There is no abdominal tenderness.  Genitourinary:    Rectum: Guaiac result negative. Normal anal tone.     Comments: DRE performed by me, heme-negative brown stool.  There is notable fecal impaction within the rectal vault.  Some disimpaction was performed by me.  She tolerated well. Musculoskeletal:        General: Swelling present.     Comments: There is some swelling to the right forearm area.  There is no excessive warmth or erythema.  Excision site of the left upper arm appears to be healing well.  There is no erythema or surrounding edema.  Radial pulses heard with portable Doppler bilaterally.  Skin:    General: Skin is warm.     Capillary Refill: Capillary refill takes less than 2 seconds.  Neurological:     General: No focal deficit present.     Mental Status: She is alert.     Sensory: No sensory deficit.     Motor: No weakness.     ED Results / Procedures / Treatments   Labs (all labs ordered are listed, but only abnormal results are displayed) Labs Reviewed  COMPREHENSIVE METABOLIC PANEL - Abnormal; Notable for the following components:      Result Value   Sodium 134 (*)    Chloride 97 (*)    Glucose, Bld 108 (*)    BUN 28 (*)    Creatinine, Ser 8.96 (*)    Albumin 3.3 (*)    AST 11 (*)    GFR, Estimated 5 (*)    All other components within normal limits  CBC WITH DIFFERENTIAL/PLATELET - Abnormal; Notable for  the following components:   RBC 3.69 (*)    Hemoglobin 10.2 (*)    HCT 33.9 (*)    RDW 18.0 (*)    Lymphs Abs 0.4 (*)    All other components within normal limits  POC OCCULT BLOOD, ED    EKG None  Radiology DG Abdomen Acute W/Chest  Result Date: 08/07/2022 CLINICAL DATA:  Constipation, swelling. EXAM: DG ABDOMEN ACUTE WITH 1 VIEW CHEST COMPARISON:  Chest x-ray 07/18/2022. FINDINGS: The heart is enlarged. Femoral approach central venous catheter tip ends at the level of the right atrium. There is no focal lung  infiltrate, pleural effusion or pneumothorax. There is no free intraperitoneal air. No dilated bowel loops are seen. There is some hyperdense material within the rectum. Average stool burden noted. Vascular calcifications are seen in the abdomen and pelvis. Surgical clips overlie the bilateral inguinal regions. No acute bony abnormality. IMPRESSION: 1. Nonobstructive, nonspecific bowel gas pattern. 2. No acute cardiopulmonary process. 3. Stable cardiomegaly. Electronically Signed   By: Ronney Asters M.D.   On: 08/07/2022 16:40    Procedures Procedures  {Document cardiac monitor, telemetry assessment procedure when appropriate:1}  Medications Ordered in ED Medications  bisacodyl (DULCOLAX) suppository 10 mg (has no administration in time range)    ED Course/ Medical Decision Making/ A&P                           Medical Decision Making Patient here for evaluation of constipation x6 days.  Has tried over-the-counter senna without relief.  End-stage renal disease patient on hemodialysis Monday Wednesday Friday.  Was recently hospitalized for MRSA bacteremia and had AV fistula of the left arm removed.  She has a temporary catheter placed in the right upper thigh has been accessed previously without difficulty.  Rectal exam performed by me.  She does have large amount of stool within the rectal vault.  Attempted some disimpaction.  Will obtain x-ray and labs for further evaluation.   She does have some mild swelling of the right forearm.  Skin is warm and pink.  Perfusing well with good cap refill and bilateral radial pulses heard with Doppler  Amount and/or Complexity of Data Reviewed Labs: ordered.    Details: Labs interpreted by me, no evidence of leukocytosis.  Hemoglobin 10.2 this is improved from previous.  Chemistries show blood glucose of 108.  BUN today 28.  Serum creatinine 8.96.  She is scheduled for dialysis tomorrow. Radiology: ordered.    Details: X-ray of abdomen which chest shows nonobstructive, nonspecific bowel gas pattern Discussion of management or test interpretation with external provider(s): On recheck, patient has had large bowel movement, feels better.  No longer having abdominal pain.  Patient is concerned of swelling of her right arm, do not feel this is related to DVT or ischemia as patient is anticoagulated, the extremity is warm and she has good cap refill and radial pulse.  She states this was also evaluated by vascular surgery 2 weeks ago.  She will continue her hemodialysis and vancomycin treatments.  I recommended over-the-counter MiraLAX as well for her constipation.  She is hypertensive here, has not taken her evening antihypertensive medications.  We will take her medications when she returns to the facility.  I feel that she is appropriate for discharge home, return precautions were discussed.  Risk OTC drugs.     {Document critical care time when appropriate:1} {Document review of labs and clinical decision tools ie heart score, Chads2Vasc2 etc:1}  {Document your independent review of radiology images, and any outside records:1} {Document your discussion with family members, caretakers, and with consultants:1} {Document social determinants of health affecting pt's care:1} {Document your decision making why or why not admission, treatments were needed:1} Final Clinical Impression(s) / ED Diagnoses Final diagnoses:  None    Rx / DC  Orders ED Discharge Orders     None

## 2022-08-07 NOTE — ED Triage Notes (Signed)
Pt to er from [facility], per ems pt is here for constipation, states that she hasn't had a bm for the past 6 days.  Pt states that she is here for the same, states that she would also like the md to look at her legs because they are a little swollen

## 2022-08-07 NOTE — Discharge Instructions (Signed)
Try over-the-counter MiraLAX, 1 heaping capful mixed in 8 to 10 ounces of juice or water daily until relief of your constipation.  Keep your dialysis appointment for tomorrow.  Also, your blood pressure today was elevated, please take your blood pressure medication when you return home.  Follow-up with your primary care provider for recheck or return to the emergency department for any new or worsening symptoms.

## 2022-08-29 ENCOUNTER — Encounter (HOSPITAL_COMMUNITY): Payer: Self-pay | Admitting: Emergency Medicine

## 2022-08-29 ENCOUNTER — Emergency Department (HOSPITAL_COMMUNITY)
Admission: EM | Admit: 2022-08-29 | Discharge: 2022-08-29 | Disposition: A | Discharge status: Home / Self Care | Payer: Medicare HMO | Attending: Emergency Medicine | Admitting: Emergency Medicine

## 2022-08-29 ENCOUNTER — Other Ambulatory Visit: Payer: Self-pay

## 2022-08-29 ENCOUNTER — Emergency Department (HOSPITAL_COMMUNITY): Payer: Medicare HMO

## 2022-08-29 DIAGNOSIS — N186 End stage renal disease: Secondary | ICD-10-CM | POA: Diagnosis not present

## 2022-08-29 DIAGNOSIS — I120 Hypertensive chronic kidney disease with stage 5 chronic kidney disease or end stage renal disease: Secondary | ICD-10-CM | POA: Insufficient documentation

## 2022-08-29 DIAGNOSIS — Z89611 Acquired absence of right leg above knee: Secondary | ICD-10-CM | POA: Insufficient documentation

## 2022-08-29 DIAGNOSIS — Z992 Dependence on renal dialysis: Secondary | ICD-10-CM | POA: Insufficient documentation

## 2022-08-29 DIAGNOSIS — R0602 Shortness of breath: Secondary | ICD-10-CM | POA: Diagnosis present

## 2022-08-29 DIAGNOSIS — Z7901 Long term (current) use of anticoagulants: Secondary | ICD-10-CM | POA: Diagnosis not present

## 2022-08-29 DIAGNOSIS — Z79899 Other long term (current) drug therapy: Secondary | ICD-10-CM | POA: Insufficient documentation

## 2022-08-29 DIAGNOSIS — Z89612 Acquired absence of left leg above knee: Secondary | ICD-10-CM | POA: Diagnosis not present

## 2022-08-29 DIAGNOSIS — E875 Hyperkalemia: Secondary | ICD-10-CM

## 2022-08-29 LAB — BASIC METABOLIC PANEL
Anion gap: 14 (ref 5–15)
BUN: 49 mg/dL — ABNORMAL HIGH (ref 6–20)
CO2: 19 mmol/L — ABNORMAL LOW (ref 22–32)
Calcium: 10.1 mg/dL (ref 8.9–10.3)
Chloride: 101 mmol/L (ref 98–111)
Creatinine, Ser: 10.96 mg/dL — ABNORMAL HIGH (ref 0.44–1.00)
GFR, Estimated: 4 mL/min — ABNORMAL LOW (ref >60)
Glucose, Bld: 110 mg/dL — ABNORMAL HIGH (ref 70–99)
Potassium: 6.1 mmol/L — ABNORMAL HIGH (ref 3.5–5.1)
Sodium: 134 mmol/L — ABNORMAL LOW (ref 135–145)

## 2022-08-29 LAB — CBC WITH DIFFERENTIAL/PLATELET
Abs Immature Granulocytes: 0.01 10*3/uL (ref 0.00–0.07)
Basophils Absolute: 0.1 10*3/uL (ref 0.0–0.1)
Basophils Relative: 1 %
Eosinophils Absolute: 0.2 10*3/uL (ref 0.0–0.5)
Eosinophils Relative: 4 %
HCT: 32.8 % — ABNORMAL LOW (ref 36.0–46.0)
Hemoglobin: 9.9 g/dL — ABNORMAL LOW (ref 12.0–15.0)
Immature Granulocytes: 0 %
Lymphocytes Relative: 8 %
Lymphs Abs: 0.5 10*3/uL — ABNORMAL LOW (ref 0.7–4.0)
MCH: 26.5 pg (ref 26.0–34.0)
MCHC: 30.2 g/dL (ref 30.0–36.0)
MCV: 87.9 fL (ref 80.0–100.0)
Monocytes Absolute: 0.4 10*3/uL (ref 0.1–1.0)
Monocytes Relative: 7 %
Neutro Abs: 4.7 10*3/uL (ref 1.7–7.7)
Neutrophils Relative %: 80 %
Platelets: 170 10*3/uL (ref 150–400)
RBC: 3.73 MIL/uL — ABNORMAL LOW (ref 3.87–5.11)
RDW: 17.2 % — ABNORMAL HIGH (ref 11.5–15.5)
WBC: 5.9 10*3/uL (ref 4.0–10.5)
nRBC: 0 % (ref 0.0–0.2)

## 2022-08-29 MED ORDER — ONDANSETRON 4 MG PO TBDP
4.0000 mg | ORAL_TABLET | Freq: Once | ORAL | Status: AC
  Administered 2022-08-29: 4 mg via ORAL
  Filled 2022-08-29: qty 1

## 2022-08-29 MED ORDER — OXYCODONE HCL 5 MG PO TABS
5.0000 mg | ORAL_TABLET | Freq: Once | ORAL | Status: AC
  Administered 2022-08-29: 5 mg via ORAL
  Filled 2022-08-29: qty 1

## 2022-08-29 NOTE — ED Provider Notes (Addendum)
[facility] EMERGENCY DEPARTMENT Provider Note   CSN: [id number] Arrival date & time: 08/29/22  0844     History  Chief Complaint  Patient presents with   Shortness of Breath    Rebekah Burton is a 46 y.o. female.  Patient's dialysis patient has been receiving dialysis for 16 years has a dialysis catheter in her right groin area.  Patient brought in by EMS for shortness of breath lower back pain and had nosebleeds all weekend long.  Not currently bleeding now.  Patient is on Eliquis.  Patient had been on oxycodone in the past.  No fall or injury.  Patient is blind.  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  Patient is able to walk some on her own.  Patient was dialyzed on Friday.  Patient normally dialyzed Monday Wednesdays and Fridays.  Patient's oxygen saturations here in the upper 90s.  No obvious respiratory distress.  No nose bleeding.  Patient was not dialyzed today she had EMS bring her here.       Home Medications Prior to Admission medications   Medication Sig Start Date End Date Taking? Authorizing Provider  acetaminophen (TYLENOL) 325 MG tablet Take 2 tablets (650 mg total) by mouth every 6 (six) hours as needed for mild pain (or Fever >/= 101). 07/19/22  Yes Darrick Meigs, Marge Duncans, MD  apixaban (ELIQUIS) 5 MG TABS tablet Take 5 mg by mouth 2 (two) times daily. Information provided by [facility]   Yes [provider]  b complex-vitamin c-folic acid (NEPHRO-VITE) 0.8 MG TABS tablet Take 1 tablet by mouth every Monday, Wednesday, and Friday with hemodialysis. 03/17/21  Yes Samella Parr, NP  carvedilol (COREG) 25 MG tablet Take 1 tablet (25 mg total) by mouth 2 (two) times daily. 03/17/21  Yes Samella Parr, NP  cloNIDine (CATAPRES) 0.3 MG tablet Take 1 tablet (0.3 mg total) by mouth 3 (three) times daily. 03/17/21  Yes Samella Parr, NP  ondansetron  (ZOFRAN) 4 MG tablet Take 4 mg by mouth every 8 (eight) hours as needed. 07/26/22  Yes [provider]  pantoprazole (PROTONIX) 40 MG tablet Take 1 tablet (40 mg total) by mouth daily. 03/17/21  Yes Samella Parr, NP  senna (SENOKOT) 8.6 MG TABS tablet Take 1 tablet (8.6 mg total) by mouth 2 (two) times daily. 03/17/21  Yes Samella Parr, NP  sevelamer carbonate (RENVELA) 800 MG tablet Take 3 tablets by mouth in the morning, at noon, and at bedtime. 08/26/22  Yes [provider]  traZODone (DESYREL) 150 MG tablet Take 150 mg by mouth at bedtime. 05/01/21  Yes [provider]  lanthanum (FOSRENOL) 1000 MG chewable tablet Chew 1 tablet (1,000 mg total) by mouth 3 (three) times daily with meals. Patient not taking: Reported on 08/29/2022 03/18/21   Samella Parr, NP  oxyCODONE (ROXICODONE) 5 MG immediate release tablet Take 1 tablet (5 mg total) by mouth every 8 (eight) hours as needed. Patient not taking: Reported on 08/29/2022 07/19/22 07/19/23  Oswald Hillock, MD      Allergies    Patient has no known allergies.    Review of Systems   Review of Systems  Constitutional:  Negative for chills and fever.  HENT:  Negative for ear pain and sore throat.   Eyes:  Positive for visual disturbance. Negative for pain.  Respiratory:  Positive for shortness of breath. Negative  for cough.   Cardiovascular:  Negative for chest pain and palpitations.  Gastrointestinal:  Negative for abdominal pain and vomiting.  Genitourinary:  Negative for dysuria and hematuria.  Musculoskeletal:  Negative for arthralgias and back pain.  Skin:  Negative for color change and rash.  Neurological:  Negative for seizures and syncope.  All other systems reviewed and are negative.   Physical Exam Updated Vital Signs BP (!) 199/154   Pulse 76   Temp (!) 97.5 F (36.4 C) (Oral)   Resp 18   Ht 1.626 m (5\' 4" )   Wt 84.4 kg   SpO2 98%   BMI 31.93 kg/m  Physical Exam Vitals and nursing note  reviewed.  Constitutional:      General: She is not in acute distress.    Appearance: Normal appearance. She is well-developed.  HENT:     Head: Normocephalic and atraumatic.  Cardiovascular:     Rate and Rhythm: Normal rate and regular rhythm.     Heart sounds: No murmur heard. Pulmonary:     Effort: Pulmonary effort is normal. No respiratory distress.     Breath sounds: Normal breath sounds. No stridor. No wheezing, rhonchi or rales.  Chest:     Chest wall: No tenderness.  Abdominal:     Palpations: Abdomen is soft.     Tenderness: There is no abdominal tenderness.  Musculoskeletal:        General: No swelling.     Cervical back: Neck supple.     Comments: Bilateral forefoot amputations.  Dialysis catheter right groin area.  Skin:    General: Skin is warm and dry.     Capillary Refill: Capillary refill takes less than 2 seconds.  Neurological:     Mental Status: She is alert and oriented to person, place, and time. Mental status is at baseline.  Psychiatric:        Mood and Affect: Mood normal.     ED Results / Procedures / Treatments   Labs (all labs ordered are listed, but only abnormal results are displayed) Labs Reviewed  BASIC METABOLIC PANEL - Abnormal; Notable for the following components:      Result Value   Sodium 134 (*)    Potassium 6.1 (*)    CO2 19 (*)    Glucose, Bld 110 (*)    BUN 49 (*)    Creatinine, Ser 10.96 (*)    GFR, Estimated 4 (*)    All other components within normal limits  CBC WITH DIFFERENTIAL/PLATELET - Abnormal; Notable for the following components:   RBC 3.73 (*)    Hemoglobin 9.9 (*)    HCT 32.8 (*)    RDW 17.2 (*)    Lymphs Abs 0.5 (*)    All other components within normal limits  CBC WITH DIFFERENTIAL/PLATELET    EKG EKG Interpretation  Date/Time:  Monday August 29 2022 09:00:45 EDT Ventricular Rate:  78 PR Interval:  155 QRS Duration: 81 QT Interval:  408 QTC Calculation: 465 R Axis:   -49 Text  Interpretation: Sinus rhythm Left anterior fascicular block Anterior infarct, old Confirmed by Fredia Sorrow [phone]) on 08/29/2022 10:52:24 AM  Radiology DG Chest Portable 1 View  Result Date: 08/29/2022 CLINICAL DATA:  Short of breath. EXAM: PORTABLE CHEST 1 VIEW COMPARISON:  08/07/2022 and older exams.  CT, 07/13/2022. FINDINGS: Cardiac silhouette mildly enlarged.  No mediastinal or hilar masses. Lungs are clear.  No convincing pleural effusion.  No pneumothorax. Apparent central venous catheter projects in the  right upper quadrant and over the right heart border, unchanged. Skeletal structures are grossly intact. IMPRESSION: No acute cardiopulmonary disease. Electronically Signed   By: Lajean Manes M.D.   On: 08/29/2022 09:34    Procedures Procedures    Medications Ordered in ED Medications  ondansetron (ZOFRAN-ODT) disintegrating tablet 4 mg (4 mg Oral Given 08/29/22 0951)  oxyCODONE (Oxy IR/ROXICODONE) immediate release tablet 5 mg (5 mg Oral Given 08/29/22 0950)    ED Course/ Medical Decision Making/ A&P                           Medical Decision Making Amount and/or Complexity of Data Reviewed Labs: ordered. Radiology: ordered.  Risk Prescription drug management.   Patient with no shortness of breath here oxygen saturations are in the upper 90s to 100% on room air.  No fever.  No tachypnea.  Chest x-ray negative no acute pulmonary edema.  Patient without any active nosebleed at this point in time no evidence of any lesion to be responsible for the nosebleeds probably secondary to being on Eliquis.  Patient's labs no leukocytosis hemoglobin 9.9, baseline for this patient.  Basic metabolic panel renal function consistent with end-stage renal disease potassium is elevated 6.1 but EKG has no peak T waves or any widened QRS.  I will try to contact her dialysis unit in [place] and see if we can get her there for dialysis today otherwise she may have to undergo dialysis here.  Dialysis  center in [place] can dialyze her today.  We will discharge her and get her transported to the dialysis center.   Final Clinical Impression(s) / ED Diagnoses Final diagnoses:  ESRD on dialysis [facility])  Hyperkalemia    Rx / DC Orders ED Discharge Orders     None         Fredia Sorrow, MD 08/29/22 1103    Fredia Sorrow, MD 08/29/22 1110

## 2022-08-29 NOTE — ED Triage Notes (Signed)
Pt BIB [facility] for SOB, lower back pain and nosebleeds all weekend, dialysis pt, last session on Friday, didn't go this am.

## 2022-08-29 NOTE — Discharge Instructions (Addendum)
Follow-up with the dialysis center.  They can dialyze you today.  They agree that you need dialysis today because your potassium is elevated.

## 2022-08-29 NOTE — ED Notes (Signed)
Called and spoke with Rebekah Burton from [facility] who did verify pt can still come today for treatment if she can get to facility by 1pm; EDP made aware and transportation being arranged

## 2022-08-29 NOTE — ED Notes (Signed)
Pt transported to [facility] for dialysis by [facility].

## 2022-09-20 ENCOUNTER — Ambulatory Visit (INDEPENDENT_AMBULATORY_CARE_PROVIDER_SITE_OTHER): Payer: Medicare HMO | Admitting: Vascular Surgery

## 2022-09-20 ENCOUNTER — Encounter: Payer: Self-pay | Admitting: Vascular Surgery

## 2022-09-20 VITALS — BP 144/88 | HR 79 | Temp 98.7°F | Resp 20 | Ht 64.0 in | Wt 186.0 lb

## 2022-09-20 DIAGNOSIS — Z992 Dependence on renal dialysis: Secondary | ICD-10-CM | POA: Diagnosis not present

## 2022-09-20 DIAGNOSIS — N186 End stage renal disease: Secondary | ICD-10-CM

## 2022-09-20 NOTE — Progress Notes (Signed)
[facility]  Patient name: Rebekah Burton MRN: [id number] DOB: 10-05-1976 Sex: female  REASON FOR VISIT: Follow-up recent excision of left arm AV access for abscess with MRSA  HPI: Rebekah Burton is a 46 y.o. female here today for follow-up and staple remover.  She presented to [facility] with sepsis.  She had bleeding from erosion and a left upper arm AV access.  She was taken to the operating room for excision of this and had a temporary catheter placed.  She actually had cardiac arrest in the operating room and was successfully resuscitated.  He excised the involved portion of the access and closed her with staples.  She is here today for removal of this.  She reports continued discomfort in her left upper arm.  She also reports swelling and discomfort in her right arm.  She is here today with her caregiver.  In reviewing her chart.  This left arm access was her only prior access.  This was initially placed in [facility] a decade ago.  She had had interventions including percutaneous thrombectomy by interventional radiology in 2021.  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  Despite this she continues to have access use in her left upper arm.  She now has a right thigh tunneled catheter.  09/20/22: Patient presents to discuss options for dialysis access going forward.  She is currently dialyzing through a right femoral vein tunneled dialysis catheter.  Current Outpatient Medications  Medication Sig Dispense Refill   acetaminophen (TYLENOL) 325 MG tablet Take 2 tablets (650 mg total) by mouth every 6 (six) hours as needed for mild pain (or Fever >/= 101).     b complex-vitamin c-folic acid (NEPHRO-VITE) 0.8 MG TABS tablet Take 1 tablet by mouth every Monday, Wednesday, and Friday with hemodialysis. 30 tablet 3   carvedilol (COREG) 25 MG tablet Take 1 tablet (25 mg total)  by mouth 2 (two) times daily. 60 tablet 3   cloNIDine (CATAPRES) 0.3 MG tablet Take 1 tablet (0.3 mg total) by mouth 3 (three) times daily. 90 tablet 3   ondansetron (ZOFRAN) 4 MG tablet Take 4 mg by mouth every 8 (eight) hours as needed.     pantoprazole (PROTONIX) 40 MG tablet Take 1 tablet (40 mg total) by mouth daily. 30 tablet 3   senna (SENOKOT) 8.6 MG TABS tablet Take 1 tablet (8.6 mg total) by mouth 2 (two) times daily. 120 tablet 0   sevelamer carbonate (RENVELA) 800 MG tablet Take 3 tablets by mouth in the morning, at noon, and at bedtime.     traZODone (DESYREL) 150 MG tablet Take 150 mg by mouth at bedtime.     No current facility-administered medications for this visit.     PHYSICAL EXAM: Vitals:   09/20/22 1300  BP: (!) 144/88  Pulse: 79  Resp: 20  Temp: 98.7 F (37.1 C)  SpO2: 97%  Weight: 186 lb (84.4 kg)  Height: 5\' 4"  (1.626 m)    Chronically ill middle-aged woman in no acute distress Prominent venous collaterals across the chest wall Regular rate and rhythm Unlabored breathing Well-healed scar in the left arm Right femoral tunneled dialysis catheter in place.  MEDICAL ISSUES: Hemodialysis access going forward for her will be challenging.  I will order a CT venogram of her chest to evaluate the central circulation.  I am hopeful that I can recanalize her central veins endovascularly, or create  a hero graft for her.  A right femoral loop graft is an option, but with her history of peripheral arterial disease requiring bypass on the left, I am worried about pursuing this option.  We will call her with the results of her CT venogram and we will strategize together.  [name], MD [facility] Phone Number: [phone] 09/20/2022 3:15 PM   Note: Portions of this report may have been transcribed using voice recognition software.  Every effort has been made to ensure accuracy; however, inadvertent computerized  transcription errors may still be present.

## 2022-09-22 ENCOUNTER — Other Ambulatory Visit: Payer: Self-pay

## 2022-09-22 DIAGNOSIS — N186 End stage renal disease: Secondary | ICD-10-CM

## 2022-09-22 NOTE — Addendum Note (Signed)
Addended by: Kaleen Mask on: 09/22/2022 12:13 PM   Modules accepted: Orders

## 2022-10-06 ENCOUNTER — Ambulatory Visit (HOSPITAL_COMMUNITY): Admission: RE | Admit: 2022-10-06 | Payer: Medicare HMO | Source: Ambulatory Visit

## 2022-10-18 IMAGING — MG DIGITAL DIAGNOSTIC BILAT W/ TOMO W/ CAD
5 of 15 series · 5 of 40 positions shown · non-contrast
Comparison: Prior chest CTs and left breast ultrasounds.
COMPARISON: Prior chest CTs and left breast ultrasounds.

Addendum:
CLINICAL DATA: 44-year-old female with history of left breast
abscess initially diagnosed 01/05/2021 with additional ultrasound
performed 02/23/2021 demonstrating persistent collection in the
retroareolar left breast and subsequent aspiration revealing
abundant white blood cells.
TECHNIQUE: Bilateral digital diagnostic mammography and breast tomosynthesis
was performed. The images were evaluated with computer-aided
detection.; Targeted ultrasound examination of the left breast was
performed

[R MLO synth-2D]
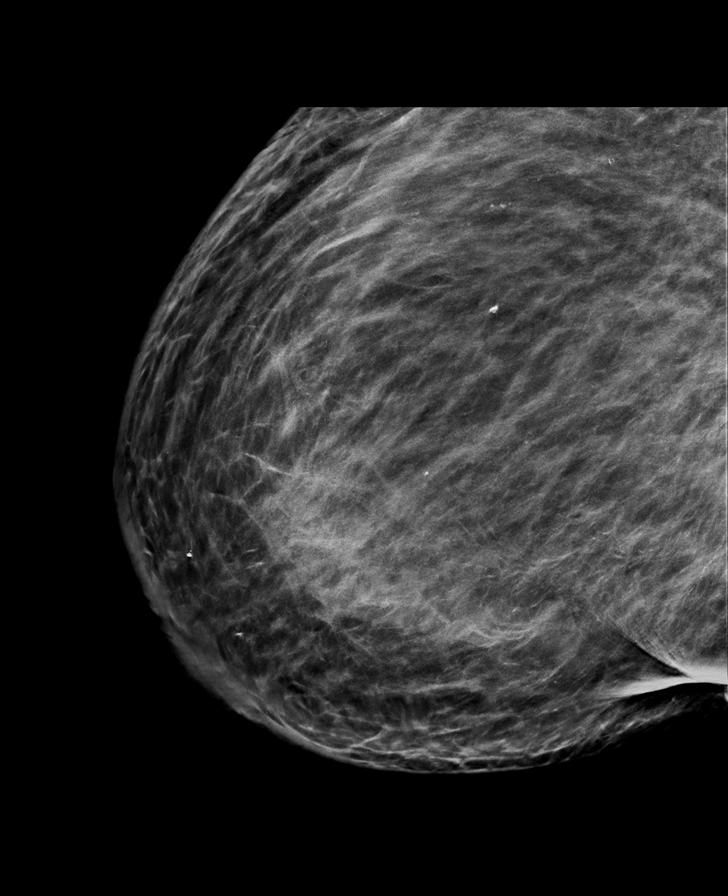

[L CC synth-2D (1 of 2)]
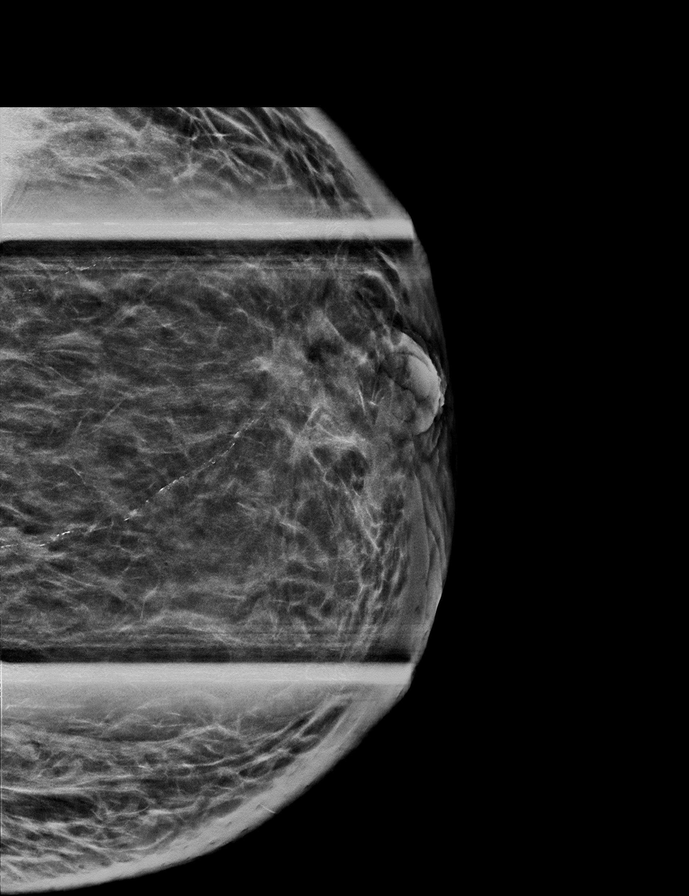

[L CC synth-2D (2 of 2)]
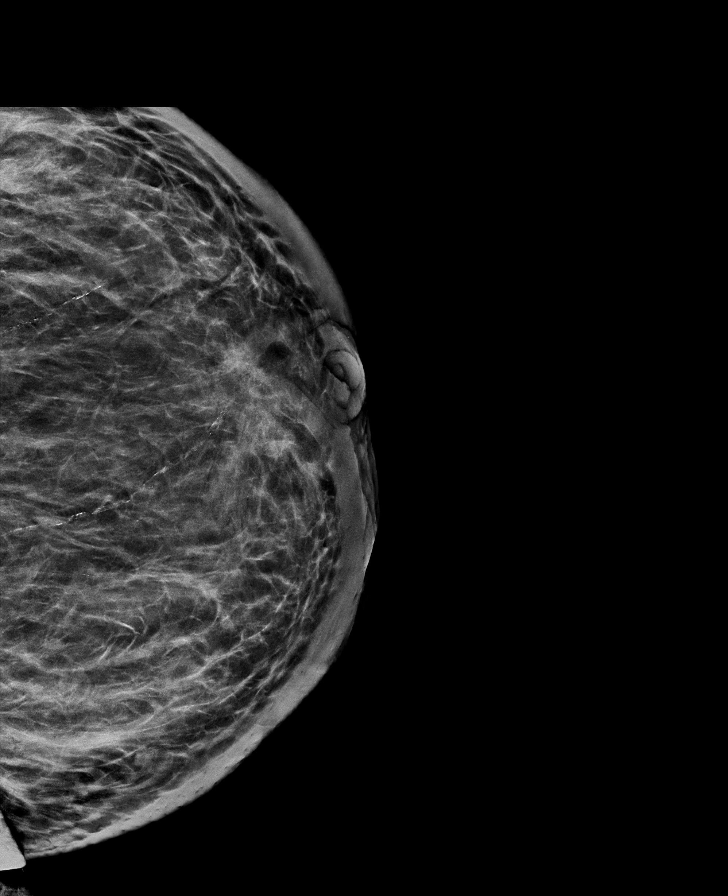

[R CC synth-2D]
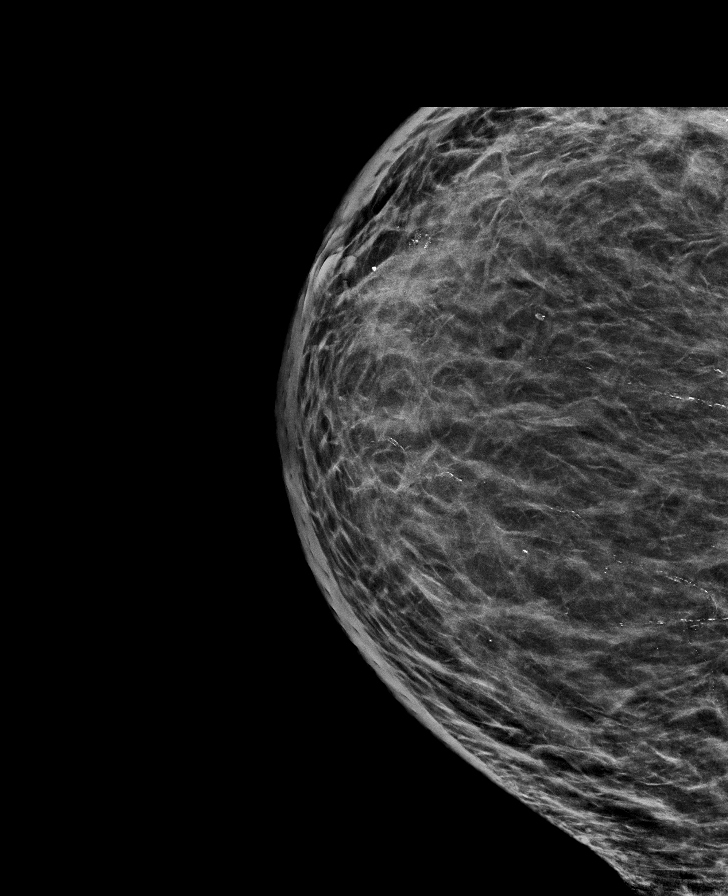

[L MLO synth-2D]
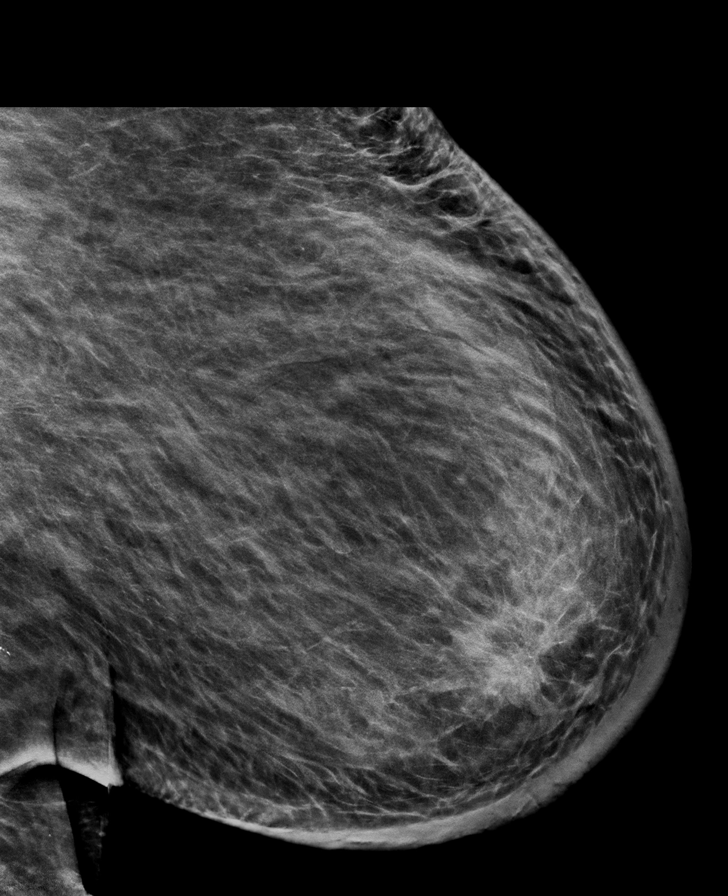

[5 of 40 positions shown; findings below may reference images not displayed]

Subsequently after the aspiration on 02/25/2021 the patient
underwent surgical incision and drainage as well as biopsy in the
OR. This revealed chronic inflammation and fibrosis of the skin
without evidence of malignancy.

The patient states her left breast symptoms have never completely
resolved. She does not think she has been on antibiotics in at least
2 months.

EXAM:
DIGITAL DIAGNOSTIC BILATERAL MAMMOGRAM WITH TOMOSYNTHESIS AND CAD;
ULTRASOUND LEFT BREAST LIMITED
ACR Breast Density Category c: The breast tissue is heterogeneously
dense, which may obscure small masses.
FINDINGS: There is bilateral skin and trabecular thickening, left greater than
right. Large tubular blood vessels are present coursing
predominantly throughout the lateral portions of the breast
bilaterally. No suspicious masses or calcifications are seen in the
right breast. There is density and mild distortion in the immediate
subareolar left breast with an area of central lucency seen on the
MLO tomograms most suggestive fat necrosis/postsurgical change.

Physical examination reveals asymmetric skin thickening and erythema
involving the left breast. There is skin thickening present in the
right breast but to a much lesser extent. Central left breast is
firm and extremely tender to palpation. There is flattening of the
nipple with scarring involving the inner periareolar right breast
related to previous incision and drainage. No purulent discharge
identified. The patient is exquisitely tender to palpation.

Targeted ultrasound of the left breast was performed. There is
marked skin thickening and subcutaneous edema predominantly
involving the central and lower left breast. There is a large blood
vessel coursing through the retroareolar left breast anterior depth.
There is an irregular hypoechoic area/mass in the immediate
retroareolar left breast measuring approximately 4.1 x 1.9 x 1.8 cm.
This may be a combination of scarring and residual fluid however
malignancy cannot be entirely excluded. A large blood vessel is
present adjacent to this area.

There are 3-4 lymph nodes with uniformly thickened cortices in the
lower left axilla, with the remainder of the left axillary lymph
nodes normal in appearance. Several blood vessels are present
surrounding these lymph nodes.
IMPRESSION: 1. Asymmetric skin and trabecular thickening involving the left
breast with an irregular hypoechoic area/mass in the immediate
retroareolar left breast. The constellation of these findings are
felt to be related to cellulitis as well as scarring and residual
fluid although malignancy cannot be entirely excluded. When compared
to the chest CT dated 01/05/2021 the findings are felt to be
markedly improved (although it is difficult to compare across
different modalities).

2. Several lymph nodes with thickened cortices in the left axilla
potentially reactive.

RECOMMENDATION:
Recommend an additional course of antibiotics to see if this results
in improvement of patient's symptoms and recommend the patient
return for repeat left breast and axilla ultrasound in 4-6 weeks. If
the patient continues to have persistent symptoms when she returns
in 4 weeks, then recommend ultrasound-guided core biopsy of the
hypoechoic area/mass in the immediate retroareolar left breast as
well as biopsy of a lymph node in the left axilla (if this can
safely be performed) to ensure benign pathology. If the patient
cannot tolerate a breast procedure given extreme pain then surgical
consultation would be recommended.

I have discussed the findings and recommendations with the patient.
If applicable, a reminder letter will be sent to the patient
regarding the next appointment.

BI-RADS CATEGORY  3: Probably benign.

ADDENDUM:
This patient was discussed with Dr. Geysa Rostirola via telephone
06/18/2021. Ms. Apartamenty underwent incision and drainage for her left
abscess twice with biopsies already taken of both the abscess cavity
and skin, all of which were negative for malignancy. Given the
history of I&Ds with biopsies the mammographic and sonographic
findings in the left breast are felt to be related to
scarring/fibrosis. Given the skin and trabecular thickening with
extreme tenderness of the left breast with palpation it is felt that
she has a persistent cellulitis and continued treatment with
antibiotics is recommended as well as possible consideration of
referral to infectious disease.

Recommend the patient return in 3 months (rather than in 4-6 weeks)
for repeat diagnostic mammography and ultrasound of the left breast
and left axilla to ensure improvement in mammographic appearance of
the left breast and left axillary lymph nodes which are felt to be
reactive.

BI-RADS category: 3: Probably benign.

*** End of Addendum ***
Subsequently after the aspiration on 02/25/2021 the patient
underwent surgical incision and drainage as well as biopsy in the
OR. This revealed chronic inflammation and fibrosis of the skin
without evidence of malignancy.

The patient states her left breast symptoms have never completely
resolved. She does not think she has been on antibiotics in at least
2 months.

EXAM:
DIGITAL DIAGNOSTIC BILATERAL MAMMOGRAM WITH TOMOSYNTHESIS AND CAD;
ULTRASOUND LEFT BREAST LIMITED
ACR Breast Density Category c: The breast tissue is heterogeneously
dense, which may obscure small masses.
FINDINGS: There is bilateral skin and trabecular thickening, left greater than
right. Large tubular blood vessels are present coursing
predominantly throughout the lateral portions of the breast
bilaterally. No suspicious masses or calcifications are seen in the
right breast. There is density and mild distortion in the immediate
subareolar left breast with an area of central lucency seen on the
MLO tomograms most suggestive fat necrosis/postsurgical change.

Physical examination reveals asymmetric skin thickening and erythema
involving the left breast. There is skin thickening present in the
right breast but to a much lesser extent. Central left breast is
firm and extremely tender to palpation. There is flattening of the
nipple with scarring involving the inner periareolar right breast
related to previous incision and drainage. No purulent discharge
identified. The patient is exquisitely tender to palpation.

Targeted ultrasound of the left breast was performed. There is
marked skin thickening and subcutaneous edema predominantly
involving the central and lower left breast. There is a large blood
vessel coursing through the retroareolar left breast anterior depth.
There is an irregular hypoechoic area/mass in the immediate
retroareolar left breast measuring approximately 4.1 x 1.9 x 1.8 cm.
This may be a combination of scarring and residual fluid however
malignancy cannot be entirely excluded. A large blood vessel is
present adjacent to this area.

There are 3-4 lymph nodes with uniformly thickened cortices in the
lower left axilla, with the remainder of the left axillary lymph
nodes normal in appearance. Several blood vessels are present
surrounding these lymph nodes.
IMPRESSION: 1. Asymmetric skin and trabecular thickening involving the left
breast with an irregular hypoechoic area/mass in the immediate
retroareolar left breast. The constellation of these findings are
felt to be related to cellulitis as well as scarring and residual
fluid although malignancy cannot be entirely excluded. When compared
to the chest CT dated 01/05/2021 the findings are felt to be
markedly improved (although it is difficult to compare across
different modalities).

2. Several lymph nodes with thickened cortices in the left axilla
potentially reactive.

RECOMMENDATION:
Recommend an additional course of antibiotics to see if this results
in improvement of patient's symptoms and recommend the patient
return for repeat left breast and axilla ultrasound in 4-6 weeks. If
the patient continues to have persistent symptoms when she returns
in 4 weeks, then recommend ultrasound-guided core biopsy of the
hypoechoic area/mass in the immediate retroareolar left breast as
well as biopsy of a lymph node in the left axilla (if this can
safely be performed) to ensure benign pathology. If the patient
cannot tolerate a breast procedure given extreme pain then surgical
consultation would be recommended.

I have discussed the findings and recommendations with the patient.
If applicable, a reminder letter will be sent to the patient
regarding the next appointment.

BI-RADS CATEGORY  3: Probably benign.

## 2022-10-20 ENCOUNTER — Ambulatory Visit (HOSPITAL_COMMUNITY)
Admission: RE | Admit: 2022-10-20 | Discharge: 2022-10-20 | Disposition: A | Discharge status: Home / Self Care | Payer: Medicare HMO | Source: Ambulatory Visit | Attending: Vascular Surgery | Admitting: Vascular Surgery

## 2022-10-20 DIAGNOSIS — Z992 Dependence on renal dialysis: Secondary | ICD-10-CM | POA: Diagnosis present

## 2022-10-20 DIAGNOSIS — N186 End stage renal disease: Secondary | ICD-10-CM | POA: Insufficient documentation

## 2022-10-20 LAB — POCT I-STAT CREATININE: Creatinine, Ser: 13 mg/dL — ABNORMAL HIGH (ref 0.44–1.00)

## 2022-10-20 MED ORDER — IOHEXOL 350 MG/ML SOLN
100.0000 mL | Freq: Once | INTRAVENOUS | Status: AC | PRN
  Administered 2022-10-20: 100 mL via INTRAVENOUS

## 2022-10-24 ENCOUNTER — Observation Stay (HOSPITAL_COMMUNITY): Payer: Medicare HMO

## 2022-10-24 ENCOUNTER — Encounter (HOSPITAL_COMMUNITY): Payer: Self-pay | Admitting: Emergency Medicine

## 2022-10-24 ENCOUNTER — Emergency Department (HOSPITAL_COMMUNITY): Payer: Medicare HMO

## 2022-10-24 ENCOUNTER — Other Ambulatory Visit: Payer: Self-pay

## 2022-10-24 ENCOUNTER — Observation Stay (HOSPITAL_COMMUNITY)
Admission: EM | Admit: 2022-10-24 | Discharge: 2022-10-25 | Disposition: A | Discharge status: Home / Self Care | Payer: Medicare HMO | Attending: Family Medicine | Admitting: Family Medicine

## 2022-10-24 DIAGNOSIS — N186 End stage renal disease: Secondary | ICD-10-CM | POA: Diagnosis present

## 2022-10-24 DIAGNOSIS — R109 Unspecified abdominal pain: Secondary | ICD-10-CM

## 2022-10-24 DIAGNOSIS — K219 Gastro-esophageal reflux disease without esophagitis: Secondary | ICD-10-CM | POA: Diagnosis present

## 2022-10-24 DIAGNOSIS — Z79899 Other long term (current) drug therapy: Secondary | ICD-10-CM | POA: Diagnosis not present

## 2022-10-24 DIAGNOSIS — Z87891 Personal history of nicotine dependence: Secondary | ICD-10-CM | POA: Insufficient documentation

## 2022-10-24 DIAGNOSIS — I169 Hypertensive crisis, unspecified: Secondary | ICD-10-CM | POA: Diagnosis not present

## 2022-10-24 DIAGNOSIS — Z8616 Personal history of COVID-19: Secondary | ICD-10-CM | POA: Insufficient documentation

## 2022-10-24 DIAGNOSIS — Z992 Dependence on renal dialysis: Secondary | ICD-10-CM | POA: Insufficient documentation

## 2022-10-24 DIAGNOSIS — I120 Hypertensive chronic kidney disease with stage 5 chronic kidney disease or end stage renal disease: Secondary | ICD-10-CM | POA: Insufficient documentation

## 2022-10-24 DIAGNOSIS — R0600 Dyspnea, unspecified: Secondary | ICD-10-CM | POA: Diagnosis present

## 2022-10-24 DIAGNOSIS — F17200 Nicotine dependence, unspecified, uncomplicated: Secondary | ICD-10-CM | POA: Diagnosis present

## 2022-10-24 DIAGNOSIS — I739 Peripheral vascular disease, unspecified: Secondary | ICD-10-CM | POA: Diagnosis present

## 2022-10-24 LAB — LIPASE, BLOOD: Lipase: 27 U/L (ref 11–51)

## 2022-10-24 LAB — CBC WITH DIFFERENTIAL/PLATELET
Abs Immature Granulocytes: 0.01 10*3/uL (ref 0.00–0.07)
Basophils Absolute: 0 10*3/uL (ref 0.0–0.1)
Basophils Relative: 1 %
Eosinophils Absolute: 0.2 10*3/uL (ref 0.0–0.5)
Eosinophils Relative: 5 %
HCT: 31.1 % — ABNORMAL LOW (ref 36.0–46.0)
Hemoglobin: 9.4 g/dL — ABNORMAL LOW (ref 12.0–15.0)
Immature Granulocytes: 0 %
Lymphocytes Relative: 10 %
Lymphs Abs: 0.5 10*3/uL — ABNORMAL LOW (ref 0.7–4.0)
MCH: 25.5 pg — ABNORMAL LOW (ref 26.0–34.0)
MCHC: 30.2 g/dL (ref 30.0–36.0)
MCV: 84.5 fL (ref 80.0–100.0)
Monocytes Absolute: 0.4 10*3/uL (ref 0.1–1.0)
Monocytes Relative: 8 %
Neutro Abs: 3.7 10*3/uL (ref 1.7–7.7)
Neutrophils Relative %: 76 %
Platelets: 130 10*3/uL — ABNORMAL LOW (ref 150–400)
RBC: 3.68 MIL/uL — ABNORMAL LOW (ref 3.87–5.11)
RDW: 17.2 % — ABNORMAL HIGH (ref 11.5–15.5)
WBC: 4.8 10*3/uL (ref 4.0–10.5)
nRBC: 0 % (ref 0.0–0.2)

## 2022-10-24 LAB — COMPREHENSIVE METABOLIC PANEL
ALT: 9 U/L (ref 0–44)
AST: 15 U/L (ref 15–41)
Albumin: 3.9 g/dL (ref 3.5–5.0)
Alkaline Phosphatase: 46 U/L (ref 38–126)
Anion gap: 15 (ref 5–15)
BUN: 63 mg/dL — ABNORMAL HIGH (ref 6–20)
CO2: 25 mmol/L (ref 22–32)
Calcium: 9.9 mg/dL (ref 8.9–10.3)
Chloride: 95 mmol/L — ABNORMAL LOW (ref 98–111)
Creatinine, Ser: 12.34 mg/dL — ABNORMAL HIGH (ref 0.44–1.00)
GFR, Estimated: 3 mL/min — ABNORMAL LOW (ref >60)
Glucose, Bld: 116 mg/dL — ABNORMAL HIGH (ref 70–99)
Potassium: 4.7 mmol/L (ref 3.5–5.1)
Sodium: 135 mmol/L (ref 135–145)
Total Bilirubin: 1.1 mg/dL (ref 0.3–1.2)
Total Protein: 7.1 g/dL (ref 6.5–8.1)

## 2022-10-24 LAB — MRSA NEXT GEN BY PCR, NASAL: MRSA by PCR Next Gen: DETECTED — AB

## 2022-10-24 LAB — HEPATITIS B SURFACE ANTIGEN: Hepatitis B Surface Ag: NONREACTIVE

## 2022-10-24 MED ORDER — CHLORHEXIDINE GLUCONATE CLOTH 2 % EX PADS
6.0000 | MEDICATED_PAD | Freq: Every day | CUTANEOUS | Status: DC
  Administered 2022-10-24: 6 via TOPICAL

## 2022-10-24 MED ORDER — OXYCODONE HCL 5 MG PO TABS
5.0000 mg | ORAL_TABLET | ORAL | Status: DC | PRN
  Administered 2022-10-24 – 2022-10-25 (×6): 5 mg via ORAL
  Filled 2022-10-24 (×6): qty 1

## 2022-10-24 MED ORDER — ONDANSETRON HCL 4 MG/2ML IJ SOLN
4.0000 mg | Freq: Four times a day (QID) | INTRAMUSCULAR | Status: DC | PRN
  Administered 2022-10-24 – 2022-10-25 (×2): 4 mg via INTRAVENOUS
  Filled 2022-10-24 (×2): qty 2

## 2022-10-24 MED ORDER — PROCHLORPERAZINE EDISYLATE 10 MG/2ML IJ SOLN
10.0000 mg | INTRAMUSCULAR | Status: DC | PRN
  Administered 2022-10-24: 10 mg via INTRAVENOUS
  Filled 2022-10-24: qty 2

## 2022-10-24 MED ORDER — ALUM & MAG HYDROXIDE-SIMETH 200-200-20 MG/5ML PO SUSP
30.0000 mL | Freq: Once | ORAL | Status: AC
  Administered 2022-10-24: 30 mL via ORAL
  Filled 2022-10-24: qty 30

## 2022-10-24 MED ORDER — IOHEXOL 300 MG/ML  SOLN
100.0000 mL | Freq: Once | INTRAMUSCULAR | Status: AC | PRN
  Administered 2022-10-24: 100 mL via INTRAVENOUS

## 2022-10-24 MED ORDER — PANTOPRAZOLE SODIUM 40 MG PO TBEC
40.0000 mg | DELAYED_RELEASE_TABLET | Freq: Every day | ORAL | Status: DC
  Administered 2022-10-24 – 2022-10-25 (×2): 40 mg via ORAL
  Filled 2022-10-24 (×2): qty 1

## 2022-10-24 MED ORDER — ONDANSETRON HCL 4 MG PO TABS: 4.0000 mg | ORAL_TABLET | Freq: Four times a day (QID) | ORAL | Status: DC | PRN

## 2022-10-24 MED ORDER — FENTANYL CITRATE PF 50 MCG/ML IJ SOSY
50.0000 ug | PREFILLED_SYRINGE | Freq: Once | INTRAMUSCULAR | Status: AC
  Administered 2022-10-24: 50 ug via INTRAVENOUS
  Filled 2022-10-24: qty 1

## 2022-10-24 MED ORDER — CARVEDILOL 12.5 MG PO TABS
25.0000 mg | ORAL_TABLET | Freq: Two times a day (BID) | ORAL | Status: DC
  Administered 2022-10-24 – 2022-10-25 (×4): 25 mg via ORAL
  Filled 2022-10-24 (×4): qty 2

## 2022-10-24 MED ORDER — HEPARIN SODIUM (PORCINE) 1000 UNIT/ML IJ SOLN
6200.0000 [IU] | Freq: Once | INTRAMUSCULAR | Status: AC
  Administered 2022-10-24: 6200 [IU]

## 2022-10-24 MED ORDER — ACETAMINOPHEN 650 MG RE SUPP: 650.0000 mg | Freq: Four times a day (QID) | RECTAL | Status: DC | PRN

## 2022-10-24 MED ORDER — TRAZODONE HCL 50 MG PO TABS
150.0000 mg | ORAL_TABLET | Freq: Every day | ORAL | Status: DC
  Administered 2022-10-24: 150 mg via ORAL
  Filled 2022-10-24: qty 3

## 2022-10-24 MED ORDER — MUPIROCIN 2 % EX OINT
1.0000 | TOPICAL_OINTMENT | Freq: Two times a day (BID) | CUTANEOUS | Status: DC
  Administered 2022-10-24 – 2022-10-25 (×2): 1 via NASAL
  Filled 2022-10-24: qty 22

## 2022-10-24 MED ORDER — CHLORHEXIDINE GLUCONATE CLOTH 2 % EX PADS
6.0000 | MEDICATED_PAD | Freq: Every day | CUTANEOUS | Status: DC
  Administered 2022-10-25: 6 via TOPICAL

## 2022-10-24 MED ORDER — ACETAMINOPHEN 325 MG PO TABS: 650.0000 mg | ORAL_TABLET | Freq: Four times a day (QID) | ORAL | Status: DC | PRN

## 2022-10-24 MED ORDER — HYDROCODONE-ACETAMINOPHEN 5-325 MG PO TABS
1.0000 | ORAL_TABLET | Freq: Once | ORAL | Status: AC
  Administered 2022-10-24: 1 via ORAL
  Filled 2022-10-24: qty 1

## 2022-10-24 MED ORDER — HYDRALAZINE HCL 20 MG/ML IJ SOLN
15.0000 mg | INTRAMUSCULAR | Status: DC | PRN
  Administered 2022-10-24 – 2022-10-25 (×3): 15 mg via INTRAVENOUS
  Filled 2022-10-24 (×3): qty 1

## 2022-10-24 MED ORDER — SEVELAMER CARBONATE 800 MG PO TABS
2400.0000 mg | ORAL_TABLET | Freq: Three times a day (TID) | ORAL | Status: DC
  Administered 2022-10-24 – 2022-10-25 (×3): 2400 mg via ORAL
  Filled 2022-10-24 (×6): qty 3

## 2022-10-24 MED ORDER — HYDRALAZINE HCL 20 MG/ML IJ SOLN
20.0000 mg | Freq: Once | INTRAMUSCULAR | Status: AC
  Administered 2022-10-24: 20 mg via INTRAVENOUS
  Filled 2022-10-24: qty 1

## 2022-10-24 MED ORDER — NICOTINE 14 MG/24HR TD PT24: 14.0000 mg | MEDICATED_PATCH | Freq: Every day | TRANSDERMAL | Status: DC | PRN

## 2022-10-24 MED ORDER — NITROGLYCERIN IN D5W 200-5 MCG/ML-% IV SOLN
0.0000 ug/min | INTRAVENOUS | Status: AC
  Administered 2022-10-24: 10 ug/min via INTRAVENOUS
  Filled 2022-10-24: qty 250

## 2022-10-24 MED ORDER — LIDOCAINE VISCOUS HCL 2 % MT SOLN
15.0000 mL | Freq: Once | OROMUCOSAL | Status: AC
  Administered 2022-10-24: 15 mL via ORAL
  Filled 2022-10-24: qty 15

## 2022-10-24 MED ORDER — HEPARIN SODIUM (PORCINE) 5000 UNIT/ML IJ SOLN
5000.0000 [IU] | Freq: Three times a day (TID) | INTRAMUSCULAR | Status: DC
  Administered 2022-10-24 – 2022-10-25 (×5): 5000 [IU] via SUBCUTANEOUS
  Filled 2022-10-24 (×5): qty 1

## 2022-10-24 MED ORDER — CLONIDINE HCL 0.2 MG PO TABS
0.3000 mg | ORAL_TABLET | Freq: Three times a day (TID) | ORAL | Status: DC
  Administered 2022-10-24 – 2022-10-25 (×4): 0.3 mg via ORAL
  Filled 2022-10-24 (×4): qty 1

## 2022-10-24 NOTE — Assessment & Plan Note (Signed)
-   Blood pressures as high as 236/176 - After 20 of hydralazine at time of admission blood pressure was still 213/147 - Nitro drip ordered but never started as patient's blood pressure then dropped to 120s - Patient was also given her home medications of Coreg and clonidine in the ED - There is some concern she may drop too low during dialysis today closely monitor - Patient did have abdominal pain does not seem to be related to the blood pressure - Chest x-ray showed cardiomegaly with mild pulmonary edema>> plan for dialysis today, does not make urine at all - Continue to monitor

## 2022-10-24 NOTE — ED Triage Notes (Signed)
Pt BIB [facility] from [facility] c/o mid abd pain that started earlier tonight and HTN. Pt states he BP has been elevated for years. Pt currently ESRD pt and had complete dialysis treatment yesterday.

## 2022-10-24 NOTE — Progress Notes (Signed)
Date and time results received: 10/24/22 1609 (use smartphrase ".now" to insert current time)  Test: MRSA PCR Critical Value: POSITIVE  Name of Provider Notified: Wynetta Emery, MD.  Orders Received? Or Actions Taken?: Orders Received - See Orders for details

## 2022-10-24 NOTE — Progress Notes (Signed)
Sent Dr. Wynetta Emery secure chat making him aware that patient just returned from dialysis and BP remains elevated.  Manual blood pressure taken and 200/140. Automatic BP is higher 229/148.  Gave her oral antihypertensive meds just now. Waiting for MD to respond.

## 2022-10-24 NOTE — Assessment & Plan Note (Signed)
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

## 2022-10-24 NOTE — ED Provider Notes (Signed)
[facility] EMERGENCY DEPARTMENT Provider Note  CSN: [id number] Arrival date & time: 10/24/22 0145  Chief Complaint(s) Abdominal Pain  HPI Rebekah Burton is a 46 y.o. female with PMH ESRD on hemodialysis Monday Wednesday Friday with tunneled femoral line in place, HTN, PAD who presents emergency department for evaluation of abdominal pain and shortness of breath.  Patient states that she missed her Friday dialysis session on 10/21/2022 and got dialyzed the following day on 10/28 but presents 24 hours later with epigastric pain, nausea and shortness of breath particularly when lying flat.  She states that this has been a persistent problem getting worse over the last 1 month.  She currently denies diarrhea, vomiting, headache, fever or other systemic symptoms.  Patient arrives significantly hypertensive at 232/157.   Past Medical History Past Medical History:  Diagnosis Date   Anemia    Blind    Blind    ESRD (end stage renal disease) ([place])    Foot ulceration ([place]) 12/2018   History of transmetatarsal amputation of left foot (HCC)    History of transmetatarsal amputation of right foot (HCC)    Hypertension    PAD (peripheral artery disease) ([place])    Tobacco abuse    Patient Active Problem List   Diagnosis Date Noted   MRSA bacteremia 07/12/2022   Symptomatic anemia 07/08/2022   Sepsis ([place]) 07/08/2022   Acute pyelonephritis 07/08/2022   Hypoalbuminemia due to protein-calorie malnutrition ([place]) 07/08/2022   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   Hypokalemia 06/18/2022   Tobacco use disorder 06/18/2022   GERD (gastroesophageal reflux disease) 17/61/6073   Complication of vascular access for dialysis    Poor social situation    Left breast abscess    Breast abscess 01/15/2021   COVID-19 virus infection 01/15/2021   Breast abscess of female 01/15/2021   Cellulitis of left lower extremity 02/05/2019   Post-operative pain    Acute blood loss anemia    S/P  transmetatarsal amputation of foot, left (HCC)    Ischemic ulcer of left foot ([place]) 01/16/2019   ESRD (end stage renal disease) ([place]) 01/16/2019   Essential hypertension 01/16/2019   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   PAD (peripheral artery disease) ([place]) 01/16/2019   Home Medication(s) Prior to Admission medications   Medication Sig Start Date End Date Taking? Authorizing Provider  acetaminophen (TYLENOL) 325 MG tablet Take 2 tablets (650 mg total) by mouth every 6 (six) hours as needed for mild pain (or Fever >/= 101). 07/19/22   Oswald Hillock, MD  b complex-vitamin c-folic acid (NEPHRO-VITE) 0.8 MG TABS tablet Take 1 tablet by mouth every Monday, Wednesday, and Friday with hemodialysis. 03/17/21   Samella Parr, NP  carvedilol (COREG) 25 MG tablet Take 1 tablet (25 mg total) by mouth 2 (two) times daily. 03/17/21   Samella Parr, NP  cloNIDine (CATAPRES) 0.3 MG tablet Take 1 tablet (0.3 mg total) by mouth 3 (three) times daily. 03/17/21   Samella Parr, NP  ondansetron (ZOFRAN) 4 MG tablet Take 4 mg by mouth every 8 (eight) hours as needed. 07/26/22   [provider]  pantoprazole (PROTONIX) 40 MG tablet Take 1 tablet (40 mg total) by mouth daily. 03/17/21   Samella Parr, NP  senna (SENOKOT) 8.6 MG TABS tablet Take 1 tablet (8.6 mg total) by mouth 2 (two) times daily. 03/17/21   Samella Parr, NP  sevelamer carbonate (RENVELA) 800 MG tablet Take 3 tablets by mouth in the morning,  at noon, and at bedtime. 08/26/22   [provider]  traZODone (DESYREL) 150 MG tablet Take 150 mg by mouth at bedtime. 05/01/21   [provider]                                                                                                                                    Past Surgical History Past Surgical History:  Procedure Laterality Date   ABDOMINAL AORTOGRAM W/LOWER EXTREMITY Bilateral 01/17/2019   Procedure: ABDOMINAL AORTOGRAM W/LOWER EXTREMITY;  Surgeon: Waynetta Sandy, MD;  Location: [place] [facility];  Service: Cardiovascular;  Laterality: Bilateral;   ABDOMINAL HYSTERECTOMY     BUBBLE STUDY  07/14/2022   Procedure: BUBBLE STUDY;  Surgeon: Berniece Salines, DO;  Location: [place];  Service: Cardiovascular;;   ENDARTERECTOMY FEMORAL Left 01/18/2019   Procedure: ENDARTERECTOMY FEMORAL with Perfundaplasty;  Surgeon: Rosetta Posner, MD;  Location: [place];  Service: Vascular;  Laterality: Left;   FEMORAL-POPLITEAL BYPASS GRAFT Left 01/18/2019   Procedure: Left  FEMORAL- to  Below the Knee POPLITEAL ARTERY bypass using reversed safenous vein.;  Surgeon: Rosetta Posner, MD;  Location: [place];  Service: Vascular;  Laterality: Left;   INCISION AND DRAINAGE ABSCESS Left 01/15/2021   Procedure: INCISION AND DRAINAGE BREAST ABSCESS;  Surgeon: Dwan Bolt, MD;  Location: [place];  Service: General;  Laterality: Left;   INCISION AND DRAINAGE ABSCESS Left 02/25/2021   Procedure: INCISION AND DRAINAGE BREAST  ABSCESS;  Surgeon: Dwan Bolt, MD;  Location: [place];  Service: General;  Laterality: Left;   INCISION AND DRAINAGE ABSCESS Left 10/26/2021   Procedure: INCISION AND DRAINAGE OF RECURRENT LEFT BREAST ABSCESS;  Surgeon: Donnie [place], MD;  Location: [place];  Service: General;  Laterality: Left;   INSERTION OF DIALYSIS CATHETER N/A 07/11/2022   Procedure: INSERTION OF RIGHT FEMORAL TEMPORARY DIALYSIS CATHETER;  Surgeon: Cherre Robins, MD;  Location: [place];  Service: Vascular;  Laterality: N/A;   INSERTION OF DIALYSIS CATHETER Right 07/18/2022   Procedure: INSERTION OF TUNNELED DIALYSIS CATHETER;  Surgeon: Angelia Mould, MD;  Location: [facility];  Service: Vascular;  Laterality: Right;   IR DIALY SHUNT INTRO NEEDLE/INTRACATH INITIAL W/IMG LEFT Left 01/28/2021   IR THROMBECTOMY AV FISTULA W/THROMBOLYSIS/PTA/STENT INC/SHUNT/IMG LT Left 10/23/2020   IR US GUIDE BX ASP/DRAIN  01/28/2021   IR US GUIDE VASC ACCESS LEFT  01/26/2021   REVISON OF ARTERIOVENOUS FISTULA  Left 07/11/2022   Procedure: EXCISION OF INFECTED LEFT ARM ARTERIOVENOUS FISTULA;  Surgeon: Cherre Robins, MD;  Location: [place];  Service: Vascular;  Laterality: Left;   TEE WITHOUT CARDIOVERSION N/A 07/14/2022   Procedure: TRANSESOPHAGEAL ECHOCARDIOGRAM (TEE);  Surgeon: Berniece Salines, DO;  Location: [facility];  Service: Cardiovascular;  Laterality: N/A;   toe removal Right    All toes on right foot have been removed.    TRANSMETATARSAL AMPUTATION Left 01/18/2019   Procedure:  TRANSMETATARSAL AMPUTATION;  Surgeon: Rosetta Posner, MD;  Location[facility];  Service: Vascular;  Laterality: Left;   Family History Family History  Problem Relation Age of Onset   Peripheral Artery Disease Neg Hx    Kidney failure Neg Hx     Social History Social History   Tobacco Use   Smoking status: Former    Packs/day: 0.50    Years: 20.00    Total pack years: 10.00    Types: Cigarettes    Quit date: 01/14/2019    Years since quitting: 3.7    Passive exposure: Current   Smokeless tobacco: Never   Tobacco comments:    <1 PPD  Vaping Use   Vaping Use: Never used  Substance Use Topics   Alcohol use: Never   Drug use: Not Currently    Types: Marijuana   Allergies Patient has no known allergies.  Review of Systems Review of Systems  Respiratory:  Positive for shortness of breath.   Gastrointestinal:  Positive for abdominal pain and nausea.    Physical Exam Vital Signs  I have reviewed the triage vital signs BP (!) 219/162   Pulse 74   Temp 97.7 F (36.5 C) (Oral)   Resp 16   Ht 5\' 4"  (1.626 m)   Wt 84.4 kg   SpO2 98%   BMI 31.94 kg/m   Physical Exam Vitals and nursing note reviewed.  Constitutional:      General: She is not in acute distress.    Appearance: She is well-developed.  HENT:     Head: Normocephalic and atraumatic.  Eyes:     Conjunctiva/sclera: Conjunctivae normal.  Cardiovascular:     Rate and Rhythm: Normal rate and regular rhythm.     Heart sounds: No murmur  heard. Pulmonary:     Effort: Pulmonary effort is normal. No respiratory distress.     Breath sounds: Rales present.  Abdominal:     Palpations: Abdomen is soft.     Tenderness: There is abdominal tenderness in the epigastric area.  Musculoskeletal:        General: No swelling.     Cervical back: Neck supple.  Skin:    General: Skin is warm and dry.     Capillary Refill: Capillary refill takes less than 2 seconds.  Neurological:     Mental Status: She is alert.  Psychiatric:        Mood and Affect: Mood normal.     ED Results and Treatments Labs (all labs ordered are listed, but only abnormal results are displayed) Labs Reviewed  CBC WITH DIFFERENTIAL/PLATELET - Abnormal; Notable for the following components:      Result Value   RBC 3.68 (*)    Hemoglobin 9.4 (*)    HCT 31.1 (*)    MCH 25.5 (*)    RDW 17.2 (*)    Platelets 130 (*)    Lymphs Abs 0.5 (*)    All other components within normal limits  COMPREHENSIVE METABOLIC PANEL - Abnormal; Notable for the following components:   Chloride 95 (*)    Glucose, Bld 116 (*)    BUN 63 (*)    Creatinine, Ser 12.34 (*)    GFR, Estimated 3 (*)    All other components within normal limits  LIPASE, BLOOD  Radiology CT ABDOMEN PELVIS W CONTRAST  Result Date: 10/24/2022 CLINICAL DATA:  Epigastric pain. EXAM: CT ABDOMEN AND PELVIS WITH CONTRAST TECHNIQUE: Multidetector CT imaging of the abdomen and pelvis was performed using the standard protocol following bolus administration of intravenous contrast. RADIATION DOSE REDUCTION: This exam was performed according to the departmental dose-optimization program which includes automated exposure control, adjustment of the mA and/or kV according to patient size and/or use of iterative reconstruction technique. CONTRAST:  143mL OMNIPAQUE IOHEXOL 300 MG/ML  SOLN COMPARISON:   07/07/2022. FINDINGS: Lower chest: The heart is enlarged and there is a small pericardial effusion. Coronary artery calcifications are noted. There is a small loculated pleural effusion at the right lung base. Ground-glass opacities are noted at the lung bases bilaterally. Hepatobiliary: A stable cyst is present in the inferior aspect of the right lobe of the liver. No biliary ductal dilatation. Stones and sludge are present in the gallbladder. Pancreas: No pancreatic ductal dilatation or surrounding inflammatory changes. There is a cyst in the tail of the pancreas measuring 1.7 cm, unchanged. Spleen: Normal in size without focal abnormality. Adrenals/Urinary Tract: No adrenal nodule or mass. Renal atrophy and cysts are noted bilaterally. There is a hyperdense lesion in the upper pole of the right kidney measuring 2.5 cm. Vascular calcifications are present at the renal hila bilaterally. No hydronephrosis. The bladder is decompressed. Stomach/Bowel: Stomach is within normal limits. Appendix appears normal. No evidence of bowel wall thickening, distention, or inflammatory changes. No free air or pneumatosis. A few scattered diverticula are present along the colon without evidence of diverticulitis. Vascular/Lymphatic: There is reflux of contrast into the inferior vena cava. A right femoral central venous catheter is noted in the right atrium an extends out of the field of view. Aortic atherosclerosis. There is suboptimal opacification of the distal abdominal aorta and branch vessels on this exam. 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 No abdominal or pelvic lymphadenopathy. Reproductive: Uterus and bilateral adnexa are unremarkable. Other: Moderate ascites. Multiple abdominal wall varices are noted. Diffuse anasarca. Musculoskeletal: Degenerative changes are present in the thoracolumbar spine. No acute osseous abnormality. IMPRESSION: 1. No bowel obstruction or  free air. 2. Hazy ground-glass opacities at the lung bases, suggesting edema. 3. Small loculated right pleural effusion. 4. Cardiomegaly with small pericardial effusion and coronary artery calcifications. 5. Moderate ascites and anasarca. 6. Stable cyst in the pancreatic tail. Recommend nonemergent contrast enhanced MRCP for further evaluation. 7. Bilateral renal atrophy and cysts. A complex lesion is present in the upper pole of the right kidney. Ultrasound is recommended for further characterization on nonemergent follow-up. 8. Aortic atherosclerosis. Electronically Signed   By: Brett Fairy M.D.   On: 10/24/2022 03:38   DG Chest 2 View  Result Date: 10/24/2022 CLINICAL DATA:  Dyspnea EXAM: CHEST - 2 VIEW COMPARISON:  08/29/2022, 07/18/2022 FINDINGS: Cardiomegaly. Heterogeneous opacities and pulmonary congestion, likely mild pulmonary edema. No definite pleural effusion or pneumothorax. No acute osseous abnormality. IMPRESSION: Cardiomegaly with mild pulmonary edema, although a superimposed infectious process cannot be excluded. Electronically Signed   By: Merilyn Baba M.D.   On: 10/24/2022 03:17    Pertinent labs & imaging results that were available during my care of the patient were reviewed by me and considered in my medical decision making (see MDM for details).  Medications Ordered in ED Medications  carvedilol (COREG) tablet 25 mg (25 mg Oral Given 10/24/22 0347)  cloNIDine (CATAPRES) tablet 0.3 mg (0.3  mg Oral Given 10/24/22 0347)  fentaNYL (SUBLIMAZE) injection 50 mcg (50 mcg Intravenous Given 10/24/22 0258)  iohexol (OMNIPAQUE) 300 MG/ML solution 100 mL (100 mLs Intravenous Contrast Given 10/24/22 0312)  alum & mag hydroxide-simeth (MAALOX/MYLANTA) 200-200-20 MG/5ML suspension 30 mL (30 mLs Oral Given 10/24/22 0336)    And  lidocaine (XYLOCAINE) 2 % viscous mouth solution 15 mL (15 mLs Oral Given 10/24/22 0337)  HYDROcodone-acetaminophen (NORCO/VICODIN) 5-325 MG per tablet 1 tablet (1  tablet Oral Given 10/24/22 0437)  hydrALAZINE (APRESOLINE) injection 20 mg (20 mg Intravenous Given 10/24/22 0439)                                                                                                                                     Procedures .Critical Care  Performed by: Teressa Lower, MD Authorized by: Teressa Lower, MD   Critical care provider statement:    Critical care time (minutes):  30   Critical care was time spent personally by me on the following activities:  Development of treatment plan with patient or surrogate, discussions with consultants, evaluation of patient's response to treatment, examination of patient, ordering and review of laboratory studies, ordering and review of radiographic studies, ordering and performing treatments and interventions, pulse oximetry, re-evaluation of patient's condition and review of old charts   (including critical care time)  Medical Decision Making / ED Course   This patient presents to the ED for concern of abdominal pain, orthopnea, this involves an extensive number of treatment options, and is a complaint that carries with it a high risk of complications and morbidity.  The differential diagnosis includes fluid overload, gastritis, pancreatitis, intra-abdominal infection, pulmonary edema or pleural effusion  MDM: Seen in the emergency department for evaluation of abdominal pain and shortness of breath.  Physical exam with Rales and decreased breath sounds on the right.  Tenderness in the epigastrium.  Laboratory evaluation with a hemoglobin of 9.4, BUN is 63, creatinine 12.34 consistent with her ESRD.  X-ray with cardiomegaly and pulmonary vascular congestion.  CT abdomen pelvis with pulmonary edema, right-sided loculated pleural effusion, cardiomegaly with a small pericardial effusion, moderate ascites and anasarca, stable cyst in the pancreatic tail and a complex lesion in the upper pole of the right kidney.  Patient  remained persistently hypertensive despite restarting her home medications and a 20 mg IV push of hydralazine.  I consulted nephrology for fluid overload in the setting of hypertensive urgency/emergency and they will help facilitate dialysis today.  Patient require hospital admission for fluid overload and need for dialysis.   Additional history obtained:  -External records from outside source obtained and reviewed including: Chart review including previous notes, labs, imaging, consultation notes   Lab Tests: -I ordered, reviewed, and interpreted labs.   The pertinent results include:   Labs Reviewed  CBC WITH DIFFERENTIAL/PLATELET - Abnormal; Notable for the following components:      Result Value  RBC 3.68 (*)    Hemoglobin 9.4 (*)    HCT 31.1 (*)    MCH 25.5 (*)    RDW 17.2 (*)    Platelets 130 (*)    Lymphs Abs 0.5 (*)    All other components within normal limits  COMPREHENSIVE METABOLIC PANEL - Abnormal; Notable for the following components:   Chloride 95 (*)    Glucose, Bld 116 (*)    BUN 63 (*)    Creatinine, Ser 12.34 (*)    GFR, Estimated 3 (*)    All other components within normal limits  LIPASE, BLOOD      EKG   EKG Interpretation  Date/Time:  Monday October 24 2022 01:55:00 EDT Ventricular Rate:  75 PR Interval:  149 QRS Duration: 80 QT Interval:  445 QTC Calculation: 498 R Axis:   -57 Text Interpretation: Sinus rhythm Left anterior fascicular block Minimal ST depression, inferior leads Confirmed by [place] ([id number]) on 10/24/2022 2:49:12 AM         Imaging Studies ordered: I ordered imaging studies including chest x-ray, CT PE I independently visualized and interpreted imaging. I agree with the radiologist interpretation   Medicines ordered and prescription drug management: Meds ordered this encounter  Medications   fentaNYL (SUBLIMAZE) injection 50 mcg   iohexol (OMNIPAQUE) 300 MG/ML solution 100 mL   AND Linked Order Group    alum &  mag hydroxide-simeth (MAALOX/MYLANTA) 200-200-20 MG/5ML suspension 30 mL    lidocaine (XYLOCAINE) 2 % viscous mouth solution 15 mL   carvedilol (COREG) tablet 25 mg   cloNIDine (CATAPRES) tablet 0.3 mg   HYDROcodone-acetaminophen (NORCO/VICODIN) 5-325 MG per tablet 1 tablet   hydrALAZINE (APRESOLINE) injection 20 mg    -I have reviewed the patients home medicines and have made adjustments as needed  Critical interventions Hypertensive management, nephrology consultation  Consultations Obtained: I requested consultation with the nephrologist Dr. Joylene Grapes,  and discussed lab and imaging findings as well as pertinent plan - they recommend: Dialysis today   Cardiac Monitoring: The patient was maintained on a cardiac monitor.  I personally viewed and interpreted the cardiac monitored which showed an underlying rhythm of: NSR  Social Determinants of Health:  Factors impacting patients care include: none   Reevaluation: After the interventions noted above, I reevaluated the patient and found that they have :improved  Co morbidities that complicate the patient evaluation  Past Medical History:  Diagnosis Date   Anemia    Blind    Blind    ESRD (end stage renal disease) ([place])    Foot ulceration ([place]) 12/2018   History of transmetatarsal amputation of left foot ([place])    History of transmetatarsal amputation of right foot ([place])    Hypertension    PAD (peripheral artery disease) ([place])    Tobacco abuse       Dispostion: I considered admission for this patient, and due to fluid overload with need for urgent dialysis, patient require hospital admission     Final Clinical Impression(s) / ED Diagnoses Final diagnoses:  None     @PCDICTATION @    Teressa Lower, MD 10/24/22 [phone]

## 2022-10-24 NOTE — H&P (Signed)
History and Physical    Patient: Rebekah Burton MPN:[id number] DOB: 1976-08-30 DOA: 10/24/2022 DOS: the patient was seen and examined on 10/24/2022 PCP: [name], NP  Patient coming from: Home  Chief Complaint:  Chief Complaint  Patient presents with   Abdominal Pain   HPI: Rebekah Burton is a 46 y.o. female with medical history significant of anemia, ESRD, vision impairment, hypertension, peripheral artery disease, tobacco use disorder, and more presents to the ED complaint of dyspnea and stomach pain.  Patient reports the stomach pain started 3 days ago in the afternoon.  That morning though she started having diarrhea.  She reports it started about 3 AM and continued throughout the entire day.  It was nonbloody.  She describes it as non-mucousy and just watery stool.  Her pain started that afternoon and was described as an achy pain in her epigastrium and periumbilical area.  It is not crampy.  She had no fever.  Patient has not been on any recent antibiotics.  He did not have any nausea or vomiting.  Patient reports that her dyspnea started the following day.  They had to put oxygen on her during her dialysis on Saturday.  She had missed a dialysis day previous to that due to other appointments, and then missed her make-up today due to the diarrhea.  She did go for full session on Saturday.  Patient does not make any urine at all.  Patient reports that her dyspnea was exertional.  Is not associated with a cough.  Patient has not had chest pain or palpitations.  Patient has no other complaints at this time.    Patient is a current smoker half pack per day.  She is currently working on quitting.  She does not drink alcohol.  Patient does smoke marijuana occasionally and the last time was 8 months ago.  She is vaccinated for COVID.  Patient is full code.  Review of Systems: As mentioned in the history of present illness. All other systems reviewed and are negative. Past Medical History:   Diagnosis Date   Anemia    Blind    Blind    ESRD (end stage renal disease) ([place])    Foot ulceration ([place]) 12/2018   History of transmetatarsal amputation of left foot (HCC)    History of transmetatarsal amputation of right foot (HCC)    Hypertension    PAD (peripheral artery disease) ([place])    Tobacco abuse    Past Surgical History:  Procedure Laterality Date   ABDOMINAL AORTOGRAM W/LOWER EXTREMITY Bilateral 01/17/2019   Procedure: ABDOMINAL AORTOGRAM W/LOWER EXTREMITY;  Surgeon: Waynetta Sandy, MD;  Location: [place] [facility];  Service: Cardiovascular;  Laterality: Bilateral;   ABDOMINAL HYSTERECTOMY     BUBBLE STUDY  07/14/2022   Procedure: BUBBLE STUDY;  Surgeon: Berniece Salines, DO;  Location: [place];  Service: Cardiovascular;;   ENDARTERECTOMY FEMORAL Left 01/18/2019   Procedure: ENDARTERECTOMY FEMORAL with Perfundaplasty;  Surgeon: Rosetta Posner, MD;  Location: [facility];  Service: Vascular;  Laterality: Left;   FEMORAL-POPLITEAL BYPASS GRAFT Left 01/18/2019   Procedure: Left  FEMORAL- to  Below the Knee POPLITEAL ARTERY bypass using reversed safenous vein.;  Surgeon: Rosetta Posner, MD;  Location: [place];  Service: Vascular;  Laterality: Left;   INCISION AND DRAINAGE ABSCESS Left 01/15/2021   Procedure: INCISION AND DRAINAGE BREAST ABSCESS;  Surgeon: Dwan Bolt, MD;  Location: [place];  Service: General;  Laterality: Left;   INCISION AND DRAINAGE ABSCESS Left  02/25/2021   Procedure: INCISION AND DRAINAGE BREAST  ABSCESS;  Surgeon: Dwan Bolt, MD;  Location: [place];  Service: General;  Laterality: Left;   INCISION AND DRAINAGE ABSCESS Left 10/26/2021   Procedure: INCISION AND DRAINAGE OF RECURRENT LEFT BREAST ABSCESS;  Surgeon: Donnie Mesa, MD;  Location: [place];  Service: General;  Laterality: Left;   INSERTION OF DIALYSIS CATHETER N/A 07/11/2022   Procedure: INSERTION OF RIGHT FEMORAL TEMPORARY DIALYSIS CATHETER;  Surgeon: Cherre Robins, MD;  Location: [place];   Service: Vascular;  Laterality: N/A;   INSERTION OF DIALYSIS CATHETER Right 07/18/2022   Procedure: INSERTION OF TUNNELED DIALYSIS CATHETER;  Surgeon: Angelia Mould, MD;  Location: [facility] OR;  Service: Vascular;  Laterality: Right;   IR DIALY SHUNT INTRO NEEDLE/INTRACATH INITIAL W/IMG LEFT Left 01/28/2021   IR THROMBECTOMY AV FISTULA W/THROMBOLYSIS/PTA/STENT INC/SHUNT/IMG LT Left 10/23/2020   IR US GUIDE BX ASP/DRAIN  01/28/2021   IR US GUIDE VASC ACCESS LEFT  01/26/2021   REVISON OF ARTERIOVENOUS FISTULA Left 07/11/2022   Procedure: EXCISION OF INFECTED LEFT ARM ARTERIOVENOUS FISTULA;  Surgeon: Cherre Robins, MD;  Location: [place];  Service: Vascular;  Laterality: Left;   TEE WITHOUT CARDIOVERSION N/A 07/14/2022   Procedure: TRANSESOPHAGEAL ECHOCARDIOGRAM (TEE);  Surgeon: Berniece Salines, DO;  Location: [facility];  Service: Cardiovascular;  Laterality: N/A;   toe removal Right    All toes on right foot have been removed.    TRANSMETATARSAL AMPUTATION Left 01/18/2019   Procedure: TRANSMETATARSAL AMPUTATION;  Surgeon: Rosetta Posner, MD;  Location: [facility] OR;  Service: Vascular;  Laterality: Left;   Social History:  reports that she quit smoking about 3 years ago. Her smoking use included cigarettes. She has a 10.00 pack-year smoking history. She has been exposed to tobacco smoke. She has never used smokeless tobacco. She reports that she does not currently use drugs after having used the following drugs: Marijuana. She reports that she does not drink alcohol.  No Known Allergies  Family History  Problem Relation Age of Onset   Peripheral Artery Disease Neg Hx    Kidney failure Neg Hx     Prior to Admission medications   Medication Sig Start Date End Date Taking? Authorizing Provider  acetaminophen (TYLENOL) 325 MG tablet Take 2 tablets (650 mg total) by mouth every 6 (six) hours as needed for mild pain (or Fever >/= 101). 07/19/22   Oswald Hillock, MD  b complex-vitamin c-folic acid (NEPHRO-VITE)  0.8 MG TABS tablet Take 1 tablet by mouth every Monday, Wednesday, and Friday with hemodialysis. 03/17/21   Samella Parr, NP  carvedilol (COREG) 25 MG tablet Take 1 tablet (25 mg total) by mouth 2 (two) times daily. 03/17/21   Samella Parr, NP  cloNIDine (CATAPRES) 0.3 MG tablet Take 1 tablet (0.3 mg total) by mouth 3 (three) times daily. 03/17/21   Samella Parr, NP  ondansetron (ZOFRAN) 4 MG tablet Take 4 mg by mouth every 8 (eight) hours as needed. 07/26/22   [provider]  pantoprazole (PROTONIX) 40 MG tablet Take 1 tablet (40 mg total) by mouth daily. 03/17/21   Samella Parr, NP  senna (SENOKOT) 8.6 MG TABS tablet Take 1 tablet (8.6 mg total) by mouth 2 (two) times daily. 03/17/21   Samella Parr, NP  sevelamer carbonate (RENVELA) 800 MG tablet Take 3 tablets by mouth in the morning, at noon, and at bedtime. 08/26/22   [provider]  traZODone (DESYREL) 150 MG  tablet Take 150 mg by mouth at bedtime. 05/01/21   [provider]    Physical Exam: Vitals:   10/24/22 0439 10/24/22 0500 10/24/22 0530 10/24/22 0550  BP: (!) 219/162 (!) 213/176 (!) 215/192 129/81  Pulse:  65 72 74  Resp:  13 10 18   Temp:      TempSrc:      SpO2:  98% 96% 97%  Weight:      Height:       1.  General: Patient lying supine in bed,  no acute distress   2. Psychiatric: Alert and oriented x 3, mood and behavior normal for situation, pleasant and cooperative with exam   3. Neurologic: Speech and language are normal, face is symmetric, moves all 4 extremities voluntarily, at baseline without acute deficits on limited exam   4. HEENMT:  Head is atraumatic, normocephalic, pupils reactive to light, neck is supple, trachea is midline, mucous membranes are moist   5. Respiratory : Lungs are clear to auscultation bilaterally without wheezing, rhonchi, rales, no cyanosis, no increase in work of breathing or accessory muscle use   6. Cardiovascular : Heart rate normal, rhythm  is regular, no murmurs, rubs or gallops, no peripheral edema, peripheral pulses palpated   7. Gastrointestinal:  Abdomen is soft, nondistended, nontender to palpation bowel sounds active, no masses or organomegaly palpated   8. Skin:  Skin is warm, dry and intact without rashes, acute lesions, or ulcers on limited exam   9.Musculoskeletal:  No acute deformities, transmetatarsal amputations bilaterally chronic left shin nodule from previous injury, no asymmetry in tone, peripheral edema present, peripheral pulses palpated, no tenderness to palpation in the extremities  Data Reviewed: Temp 97.7, heart rate 65-75, respiratory rate 13-22, blood pressure 213/147-237/176, satting 98% No leukocytosis with a white blood cell count of 4.8, hemoglobin 9.4, platelets 130 Is remarkable for a BUN of 63 and a creatinine of 12.34, GFR 3 CT abdomen shows no SBO.  Small loculated pleural effusion.  Edema at the lung bases.  Small pericardial effusion.  Moderate ascites and anasarca.  Stable cyst in pancreatic tail.  Nonemergent MRCP recommended.  Ultrasound kidneys recommended. Chest x-ray shows cardiomegaly with mild pulmonary edema EKG shows a heart rate of 75, sinus rhythm, QTc 498 Patient was given Coreg, clonidine, GI cocktail, fentanyl, hydralazine 20 mg, Norco in the ED Nephrology was consulted and plans for dialysis today  Chart review reveals that patient also had a TEE in July of this year which showed ejection fraction of 60-65% with left ventricle normal function.  There was severely elevated pulmonary arterial systolic pressure. 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  SVC stenosis.  Moderate pericardial effusion.  3 layered and protruding plaque involving the descending aorta.  Today's pulmonary edema seems to be related to hypertensive crisis fluid overload in ESRD patient.  Not likely purely cardiac in etiology.  For this reason no echo ordered at this time.  Assessment and  Plan: * Hypertensive crisis - Blood pressures as high as 236/176 - After 20 of hydralazine at time of admission blood pressure was still 213/147 - Nitro drip ordered but never started as patient's blood pressure then dropped to 120s - Patient was also given her home medications of Coreg and clonidine in the ED - There is some concern she may drop too low during dialysis today closely monitor - Patient did have abdominal pain does not seem to be related to the blood pressure - Chest x-ray showed cardiomegaly with  mild pulmonary edema>> plan for dialysis today, does not make urine at all - Continue to monitor  Abdominal pain Started Friday and was associated with 1 day of multiple episodes of diarrhea - No hematochezia or melena - No nausea or vomiting - Pain resolved with GI cocktail, fentanyl, and controlling blood pressure - CT scan showed no SBO, did show chronic findings like a stable cyst in the pancreatic tail and a complex lesion in the upper pole of the right kidney>> these will require nonemergent imaging  GERD (gastroesophageal reflux disease) - Continue Protonix  PAD (peripheral artery disease) ([place]) Very difficult to access - Patient does have peripheral IV at this time - Dialysis catheter is a tunneled femoral - Continue to monitor  ESRD (end stage renal disease) ([place]) - Normal dialysis schedule Monday Wednesday Friday - Last dialyzed Saturday due to other medical appointment scheduling - Nephrology plans to dialyze today - Continue Renvela - Continue renal diet with fluid restriction - Continue to monitor      Advance Care Planning:   Code Status: Full Code   Consults: Nephrology  Family Communication: No family at bedside  Severity of Illness: The appropriate patient status for this patient is OBSERVATION. Observation status is judged to be reasonable and necessary in order to provide the required intensity of service to ensure the patient's safety. The  patient's presenting symptoms, physical exam findings, and initial radiographic and laboratory data in the context of their medical condition is felt to place them at decreased risk for further clinical deterioration. Furthermore, it is anticipated that the patient will be medically stable for discharge from the hospital within 2 midnights of admission.   Author: Rolla Plate, DO 10/24/2022 6:13 AM  For on call review [email or web address].

## 2022-10-24 NOTE — Progress Notes (Signed)
Patient transferred to dialysis by bed with this RN and Darylene Price, RN. Alert with no distress noted.

## 2022-10-24 NOTE — Progress Notes (Signed)
Dr. Moshe Cipro at bedside and gave order to hold scheduled morning doses of coreg and clonidine before dialysis treatment today.

## 2022-10-24 NOTE — Progress Notes (Signed)
Noted that this patient is on OBS status for abdominal pain that resolved but also very high BP.  I spoke to her OP HD unit-  they report she is non compliant with HD  missed Wed last week-  did go Saturday but signed off early-  could not tell me the weight she left out at on Saturday   OP HD orders  MWF [facility]  4:15 ( but does not run it) -  EDW 88, TDC, 400 BFR/500 DFR, 2/2.5 bath, no heparin, mircera 150 q 2 weeks and venofer 50 q week  Will plan for HD today on schedule-  focus on volume removal to see if will help BP  Louis Meckel

## 2022-10-24 NOTE — Assessment & Plan Note (Signed)
-   Normal dialysis schedule Monday Wednesday Friday - Last dialyzed Saturday due to other medical appointment scheduling - Nephrology plans to dialyze today - Continue Renvela - Continue renal diet with fluid restriction - Continue to monitor

## 2022-10-24 NOTE — Assessment & Plan Note (Signed)
Very difficult to access - Patient does have peripheral IV at this time - Dialysis catheter is a tunneled femoral - Continue to monitor

## 2022-10-24 NOTE — Progress Notes (Signed)
Sent Dr. Wynetta Emery secure chat and let MD know that patient came up from ED on nitro drip and nephrology came by to see her and said to not give her oral antihypertensives before dialysis. Blood pressure remains very elevated 207/140 at present. Waiting for response from MD.

## 2022-10-24 NOTE — TOC Progression Note (Signed)
Transition of Care [facility]) - Progression Note    Patient Details  Name: Rebekah Burton MRN: [id number] Date of Birth: 01-05-1976  Transition of Care [facility]) CM/SW Contact  Salome Arnt, [place] Phone Number: 10/24/2022, 10:44 AM  Clinical Narrative:  Transition of Care [facility]) Screening Note   Patient Details  Name: Rebekah Burton Date of Birth: 1976/09/30   Transition of Care [facility]) CM/SW Contact:    Salome Arnt, [place] Phone Number: 10/24/2022, 10:44 AM    Transition of Care Department [facility]) has reviewed patient and no TOC needs have been identified at this time. We will continue to monitor patient advancement through interdisciplinary progression rounds. If new patient transition needs arise, please place a TOC consult.             Expected Discharge Plan and Services                                                 Social Determinants of Health (SDOH) Interventions    Readmission Risk Interventions    03/16/2021    2:06 PM  Readmission Risk Prevention Plan  Transportation Screening Complete  PCP or Specialist Appt within 5-7 Days Complete  Home Care Screening Complete  Medication Review (RN CM) Complete

## 2022-10-24 NOTE — Assessment & Plan Note (Signed)
-   Patient is currently working on quitting - She declines nicotine patch for cravings

## 2022-10-24 NOTE — Progress Notes (Signed)
This RN spoke with Dr. Wynetta Emery via secure chat and made MD aware that patient is still on nitro drip with very elevated BP 210/148 and that dialysis nurse, Bo, called for report asking for patient to come to dialysis unit per Dr. Shelva Majestic request. RN asked MD if it is okay for patient to have dialysis in dialysis unit instead of bedside in ICU. Dr. Wynetta Emery stated to ask Dr. Moshe Cipro. RN sent Dr. Jonnie Finner secure chat and made MD aware that patient remains on nitro drip and BP is elevated 210/148 and that Dialysis nurse is calling for report. RN asked Dr. Moshe Cipro if patient is okay to go to dialysis unit for treatment or should she have dialysis treatment at bedside in ICU since she is still on nitro drip. Dr. Moshe Cipro gave order to send patient to dialysis unit for treatment stating "understanding that nitro will need to be stopped for transport.  I anticipate her BP will go down with hd.  hd nurse is aware and comfortable having her in the HD unit."

## 2022-10-24 NOTE — ED Notes (Signed)
Pt reports RUE restriction. Pt reports last dialysis was this Saturday.

## 2022-10-24 NOTE — ED Notes (Signed)
Patient transported to CT 

## 2022-10-24 NOTE — Progress Notes (Signed)
Patient placed on 2L nasal cannula per her request stating she feels short of breath. O2 sats 97-98% on RA. No acute distress noted.

## 2022-10-24 NOTE — Assessment & Plan Note (Signed)
Continue Protonix °

## 2022-10-24 NOTE — Progress Notes (Signed)
Received patient in bed to unit.  Alert and oriented.  Informed consent signed and in chart.   Treatment initiated: 1140 Treatment completed: 1540  Patient tolerated well.  Transported back to the room  Alert, without acute distress.  Hand-off given to patient's nurse.   Access used: catheter Access issues: BFR 300.  Total UF removed: 4.5 L Medication(s) given: none Post HD VS: 200/115 P 70 R 16. O2 sat 100 % in 2 L O2 using. Post HD weight: 80 Kg   [facility]

## 2022-10-24 NOTE — Progress Notes (Signed)
ASSUMPTION OF CARE NOTE   10/24/2022 1:45 PM  Rebekah Burton was seen and examined.  The H&P by the admitting provider, orders, imaging was reviewed.  Please see new orders.  Will continue to follow.   Vitals:   10/24/22 1300 10/24/22 1330  BP: (!) 170/120 (!) 200/108  Pulse:    Resp: 16 12  Temp:    SpO2: 100% 100%    Results for orders placed or performed during the hospital encounter of 10/24/22  CBC with Differential  Result Value Ref Range   WBC 4.8 4.0 - 10.5 K/uL   RBC 3.68 (L) 3.87 - 5.11 MIL/uL   Hemoglobin 9.4 (L) 12.0 - 15.0 g/dL   HCT 31.1 (L) 36.0 - 46.0 %   MCV 84.5 80.0 - 100.0 fL   MCH 25.5 (L) 26.0 - 34.0 pg   MCHC 30.2 30.0 - 36.0 g/dL   RDW 17.2 (H) 11.5 - 15.5 %   Platelets 130 (L) 150 - 400 K/uL   nRBC 0.0 0.0 - 0.2 %   Neutrophils Relative % 76 %   Neutro Abs 3.7 1.7 - 7.7 K/uL   Lymphocytes Relative 10 %   Lymphs Abs 0.5 (L) 0.7 - 4.0 K/uL   Monocytes Relative 8 %   Monocytes Absolute 0.4 0.1 - 1.0 K/uL   Eosinophils Relative 5 %   Eosinophils Absolute 0.2 0.0 - 0.5 K/uL   Basophils Relative 1 %   Basophils Absolute 0.0 0.0 - 0.1 K/uL   Immature Granulocytes 0 %   Abs Immature Granulocytes 0.01 0.00 - 0.07 K/uL  Comprehensive metabolic panel  Result Value Ref Range   Sodium 135 135 - 145 mmol/L   Potassium 4.7 3.5 - 5.1 mmol/L   Chloride 95 (L) 98 - 111 mmol/L   CO2 25 22 - 32 mmol/L   Glucose, Bld 116 (H) 70 - 99 mg/dL   BUN 63 (H) 6 - 20 mg/dL   Creatinine, Ser 12.34 (H) 0.44 - 1.00 mg/dL   Calcium 9.9 8.9 - 10.3 mg/dL   Total Protein 7.1 6.5 - 8.1 g/dL   Albumin 3.9 3.5 - 5.0 g/dL   AST 15 15 - 41 U/L   ALT 9 0 - 44 U/L   Alkaline Phosphatase 46 38 - 126 U/L   Total Bilirubin 1.1 0.3 - 1.2 mg/dL   GFR, Estimated 3 (L) >60 mL/min   Anion gap 15 5 - 15  Lipase, blood  Result Value Ref Range   Lipase 27 11 - 51 U/L    C. Wynetta Emery, MD [facility]   10/24/2022  1:52 AM How to contact the [facility] Attending or Consulting provider  [place] or covering provider during after hours [place], for this patient?  Check the care team in [facility] and look for a) attending/consulting TR[facility] provider listed and b) the [facility] team listed Log into [email or web address] and use [place]'s universal password to access. If you do not have the password, please contact the hospital operator. Locate the [facility] provider you are looking for under [facility] and page to a number that you can be directly reached. If you still have difficulty reaching the provider, please page the [facility] (Director on Call) for the Hospitalists listed on amion for assistance.

## 2022-10-25 ENCOUNTER — Ambulatory Visit: Payer: Medicare HMO | Admitting: Vascular Surgery

## 2022-10-25 ENCOUNTER — Observation Stay (HOSPITAL_COMMUNITY): Payer: Medicare HMO

## 2022-10-25 DIAGNOSIS — K219 Gastro-esophageal reflux disease without esophagitis: Secondary | ICD-10-CM | POA: Diagnosis not present

## 2022-10-25 DIAGNOSIS — N186 End stage renal disease: Secondary | ICD-10-CM | POA: Diagnosis not present

## 2022-10-25 DIAGNOSIS — I739 Peripheral vascular disease, unspecified: Secondary | ICD-10-CM | POA: Diagnosis not present

## 2022-10-25 DIAGNOSIS — I169 Hypertensive crisis, unspecified: Secondary | ICD-10-CM | POA: Diagnosis not present

## 2022-10-25 LAB — CBC
HCT: 30.4 % — ABNORMAL LOW (ref 36.0–46.0)
Hemoglobin: 9.1 g/dL — ABNORMAL LOW (ref 12.0–15.0)
MCH: 25.3 pg — ABNORMAL LOW (ref 26.0–34.0)
MCHC: 29.9 g/dL — ABNORMAL LOW (ref 30.0–36.0)
MCV: 84.4 fL (ref 80.0–100.0)
Platelets: 144 10*3/uL — ABNORMAL LOW (ref 150–400)
RBC: 3.6 MIL/uL — ABNORMAL LOW (ref 3.87–5.11)
RDW: 17.2 % — ABNORMAL HIGH (ref 11.5–15.5)
WBC: 4 10*3/uL (ref 4.0–10.5)
nRBC: 0 % (ref 0.0–0.2)

## 2022-10-25 LAB — RENAL FUNCTION PANEL
Albumin: 3.8 g/dL (ref 3.5–5.0)
Anion gap: 14 (ref 5–15)
BUN: 42 mg/dL — ABNORMAL HIGH (ref 6–20)
CO2: 24 mmol/L (ref 22–32)
Calcium: 9.9 mg/dL (ref 8.9–10.3)
Chloride: 97 mmol/L — ABNORMAL LOW (ref 98–111)
Creatinine, Ser: 9.24 mg/dL — ABNORMAL HIGH (ref 0.44–1.00)
GFR, Estimated: 5 mL/min — ABNORMAL LOW (ref >60)
Glucose, Bld: 87 mg/dL (ref 70–99)
Phosphorus: 5.1 mg/dL — ABNORMAL HIGH (ref 2.5–4.6)
Potassium: 4.2 mmol/L (ref 3.5–5.1)
Sodium: 135 mmol/L (ref 135–145)

## 2022-10-25 LAB — TSH: TSH: 5.992 u[IU]/mL — ABNORMAL HIGH (ref 0.350–4.500)

## 2022-10-25 LAB — MAGNESIUM: Magnesium: 1.8 mg/dL (ref 1.7–2.4)

## 2022-10-25 LAB — HEPATITIS B SURFACE ANTIBODY, QUANTITATIVE: Hep B S AB Quant (Post): 9.2 m[IU]/mL — ABNORMAL LOW (ref >9.9)

## 2022-10-25 MED ORDER — SENNA 8.6 MG PO TABS: 1.0000 | ORAL_TABLET | Freq: Every day | ORAL | Status: AC | PRN

## 2022-10-25 MED ORDER — CHLORHEXIDINE GLUCONATE CLOTH 2 % EX PADS
6.0000 | MEDICATED_PAD | Freq: Every day | CUTANEOUS | Status: DC
  Administered 2022-10-25: 6 via TOPICAL

## 2022-10-25 NOTE — Progress Notes (Signed)
Still on obs status  HD yest-  removed 4.5 liters -  tolerated well-  supposedly below her OP EDW  BP in the 140's today on coreg 25 bid, clonidine 0.3 tid and prn hydralazine.  I think getting volume off is the key to improving her BP control ( she is not compliant with HD treatments as OP )   Not sure of dispo as she is on obs-  will need HD tomorrow-  could be at OP unit but will order for here if she needs to stay   Louis Meckel

## 2022-10-25 NOTE — Discharge Instructions (Signed)
IMPORTANT INFORMATION: PAY CLOSE ATTENTION   PHYSICIAN DISCHARGE INSTRUCTIONS  Follow with Primary care provider  [name], NP  and other consultants as instructed by your Hospitalist Physician  [place] IF SYMPTOMS COME BACK, WORSEN OR NEW PROBLEM DEVELOPS   Please note: You were cared for by a hospitalist during your hospital stay. Every effort will be made to forward records to your primary care provider.  You can request that your primary care provider send for your hospital records if they have not received them.  Once you are discharged, your primary care physician will handle any further medical issues. Please note that NO REFILLS for any discharge medications will be authorized once you are discharged, as it is imperative that you return to your primary care physician (or establish a relationship with a primary care physician if you do not have one) for your post hospital discharge needs so that they can reassess your need for medications and monitor your lab values.  Please get a complete blood count and chemistry panel checked by your Primary MD at your next visit, and again as instructed by your Primary MD.  Get Medicines reviewed and adjusted: Please take all your medications with you for your next visit with your Primary MD  Laboratory/radiological data: Please request your Primary MD to go over all hospital tests and procedure/radiological results at the follow up, please ask your primary care provider to get all Hospital records sent to his/her office.  In some cases, they will be blood work, cultures and biopsy results pending at the time of your discharge. Please request that your primary care provider follow up on these results.  If you are diabetic, please bring your blood sugar readings with you to your follow up appointment with primary care.    Please call and make your follow up appointments as soon as possible.    Also Note  the following: If you experience worsening of your admission symptoms, develop shortness of breath, life threatening emergency, suicidal or homicidal thoughts you must seek medical attention immediately by calling 911 or calling your MD immediately  if symptoms less severe.  You must read complete instructions/literature along with all the possible adverse reactions/side effects for all the Medicines you take and that have been prescribed to you. Take any new Medicines after you have completely understood and accpet all the possible adverse reactions/side effects.   Do not drive when taking Pain medications or sleeping medications (Benzodiazepines)  Do not take more than prescribed Pain, Sleep and Anxiety Medications. It is not advisable to combine anxiety,sleep and pain medications without talking with your primary care practitioner  Special Instructions: If you have smoked or chewed Tobacco  in the last 2 yrs please stop smoking, stop any regular Alcohol  and or any Recreational drug use.  Wear Seat belts while driving.  Do not drive if taking any narcotic, mind altering or controlled substances or recreational drugs or alcohol.

## 2022-10-25 NOTE — NC FL2 (Signed)
[place] LEVEL OF CARE SCREENING TOOL     IDENTIFICATION  Patient Name: Rebekah Burton Birthdate: 01-26-1976 Sex: female Admission Date (Current Location): 10/24/2022  [place] and [place] Number:  [facility] [id number] [place] and Address:  [facility] [address] [place]      Provider Number: [id number]  Attending Physician Name and Address:  Murlean Iba, MD  Relative Name and Phone Number:  [place] (Other)   [phone]    Current Level of Care: Hospital (observation) Recommended Level of Care: [facility] Prior Approval Number:    Date Approved/Denied:   PASRR Number:    Discharge Plan: Domiciliary (Rest home) [facility])    Current Diagnoses: Patient Active Problem List   Diagnosis Date Noted   Hypertensive crisis 10/24/2022   Abdominal pain 10/24/2022   MRSA bacteremia 07/12/2022   Symptomatic anemia 07/08/2022   Sepsis ([place]) 07/08/2022   Acute pyelonephritis 07/08/2022   Hypoalbuminemia due to protein-calorie malnutrition ([place]) 07/08/2022   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   Hypokalemia 06/18/2022   Tobacco use disorder 06/18/2022   GERD (gastroesophageal reflux disease) 51/88/4166   Complication of vascular access for dialysis    Poor social situation    Left breast abscess    Breast abscess 01/15/2021   COVID-19 virus infection 01/15/2021   Breast abscess of female 01/15/2021   Cellulitis of left lower extremity 02/05/2019   Post-operative pain    Acute blood loss anemia    S/P transmetatarsal amputation of foot, left ([place])    Ischemic ulcer of left foot ([place]) 01/16/2019   ESRD (end stage renal disease) ([place]) 01/16/2019   Essential hypertension 01/16/2019   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   PAD (peripheral artery disease) ([place]) 01/16/2019    Orientation RESPIRATION BLADDER Height & Weight     Self, Time, Situation, Place  Normal Continent Weight: 176 lb 5.9 oz (80  kg) Height:  5\' 4"  (162.6 cm)  BEHAVIORAL SYMPTOMS/MOOD NEUROLOGICAL BOWEL NUTRITION STATUS      Continent Diet (renal, with fluid restriction, 1269mL fluid)  AMBULATORY STATUS COMMUNICATION OF NEEDS Skin   Supervision Verbally Normal                       Personal Care Assistance Level of Assistance  Bathing, Dressing, Feeding Bathing Assistance: Limited assistance Feeding assistance: Independent Dressing Assistance: Limited assistance     Functional Limitations Info  Sight, Hearing, Speech Sight Info: Impaired Hearing Info: Adequate Speech Info: Adequate    SPECIAL CARE FACTORS FREQUENCY                       Contractures Contractures Info: Not present    Additional Factors Info  Code Status, Allergies Code Status Info: Full Code Allergies Info: NKA           Current Medications (10/25/2022):  This is the current hospital active medication list Current Facility-Administered Medications  Medication Dose Route Frequency Provider Last Rate Last Admin   acetaminophen (TYLENOL) tablet 650 mg  650 mg Oral Q6H PRN Zierle-Ghosh, Asia B, DO       Or   acetaminophen (TYLENOL) suppository 650 mg  650 mg Rectal Q6H PRN Zierle-Ghosh, Asia B, DO       carvedilol (COREG) tablet 25 mg  25 mg Oral BID Zierle-Ghosh, Asia B, DO   25 mg at 10/25/22 0950   Chlorhexidine Gluconate Cloth 2 % PADS 6 each  6  each Topical [id number] Corliss Parish, MD   6 each at 10/24/22 [phone]   Chlorhexidine Gluconate Cloth 2 % PADS 6 each  6 each Topical Q0600 Murlean Iba, MD   6 each at 10/25/22 0645   Chlorhexidine Gluconate Cloth 2 % PADS 6 each  6 each Topical Q0600 Corliss Parish, MD   6 each at 10/25/22 1204   cloNIDine (CATAPRES) tablet 0.3 mg  0.3 mg Oral TID Zierle-Ghosh, Asia B, DO   0.3 mg at 10/25/22 0949   heparin injection 5,000 Units  5,000 Units Subcutaneous Q8H Zierle-Ghosh, Asia B, DO   5,000 Units at 10/25/22 0646   hydrALAZINE (APRESOLINE) injection 15 mg  15  mg Intravenous Q4H PRN Johnson, Clanford L, MD   15 mg at 10/25/22 0425   mupirocin ointment (BACTROBAN) 2 % 1 Application  1 Application Nasal BID Irwin Brakeman L, MD   1 Application at 03/47/42 5956   nicotine (NICODERM CQ - dosed in mg/24 hours) patch 14 mg  14 mg Transdermal Daily PRN Johnson, Clanford L, MD       ondansetron (ZOFRAN) tablet 4 mg  4 mg Oral Q6H PRN Zierle-Ghosh, Asia B, DO       Or   ondansetron (ZOFRAN) injection 4 mg  4 mg Intravenous Q6H PRN Zierle-Ghosh, Asia B, DO   4 mg at 10/25/22 0511   oxyCODONE (Oxy IR/ROXICODONE) immediate release tablet 5 mg  5 mg Oral Q4H PRN Zierle-Ghosh, Asia B, DO   5 mg at 10/25/22 1000   pantoprazole (PROTONIX) EC tablet 40 mg  40 mg Oral Daily Zierle-Ghosh, Asia B, DO   40 mg at 10/25/22 0949   prochlorperazine (COMPAZINE) injection 10 mg  10 mg Intravenous Q4H PRN Johnson, Clanford L, MD   10 mg at 10/24/22 1010   sevelamer carbonate (RENVELA) tablet 2,400 mg  2,400 mg Oral TID WC Zierle-Ghosh, Asia B, DO   2,400 mg at 10/25/22 1216   traZODone (DESYREL) tablet 150 mg  150 mg Oral QHS Zierle-Ghosh, Asia B, DO   150 mg at 10/24/22 2108     Discharge Medications: TAKE these medications     acetaminophen 325 MG tablet Commonly known as: TYLENOL Take 2 tablets (650 mg total) by mouth every 6 (six) hours as needed for mild pain (or Fever >/= 101).    b complex-vitamin c-folic acid 0.8 MG Tabs tablet Take 1 tablet by mouth every Monday, Wednesday, and Friday with hemodialysis.    carvedilol 25 MG tablet Commonly known as: COREG Take 1 tablet (25 mg total) by mouth 2 (two) times daily.    cloNIDine 0.3 MG tablet Commonly known as: CATAPRES Take 1 tablet (0.3 mg total) by mouth 3 (three) times daily.    hydrALAZINE 10 MG tablet Commonly known as: APRESOLINE Take 10 mg by mouth in the morning and at bedtime.    ondansetron 4 MG tablet Commonly known as: ZOFRAN Take 4 mg by mouth every 8 (eight) hours as needed for nausea or  vomiting.    pantoprazole 40 MG tablet Commonly known as: PROTONIX Take 1 tablet (40 mg total) by mouth daily.    senna 8.6 MG Tabs tablet Commonly known as: SENOKOT Take 1 tablet (8.6 mg total) by mouth daily as needed for mild constipation.    sevelamer carbonate 800 MG tablet Commonly known as: RENVELA Take 3 tablets by mouth in the morning, at noon, and at bedtime.    simethicone 80 MG chewable tablet Commonly known as: MYLICON  Chew 2 tablets by mouth 2 (two) times daily.    traZODone 150 MG tablet Commonly known as: DESYREL Take 150 mg by mouth at bedtime.      Relevant Imaging Results:  Relevant Lab Results:   Additional Information [phone]  Ihor Gully, LCSW

## 2022-10-25 NOTE — Discharge Summary (Signed)
Physician Discharge Summary  Rebekah Burton BDZ:[id number] DOB: [birth year] DOA: 10/24/2022  PCP: [name], NP  Admit date: 10/24/2022 Discharge date: 10/25/2022  Admitted From:  Home  Disposition: Home   Recommendations for Outpatient Follow-up:  Please go to regular dialysis center tomorrow for more dialysis treatment  Discharge Condition: STABLE   CODE STATUS: FULL DIET: renal    Brief Hospitalization Summary: Please see all hospital notes, images, labs for full details of the hospitalization. ADMISSION HPI:  46 y.o. female with medical history significant of anemia, ESRD, vision impairment, hypertension, peripheral artery disease, tobacco use disorder, and more presents to the ED complaint of dyspnea and stomach pain.  Patient reports the stomach pain started 3 days ago in the afternoon.  That morning though she started having diarrhea.  She reports it started about 3 AM and continued throughout the entire day.  It was nonbloody.  She describes it as non-mucousy and just watery stool.  Her pain started that afternoon and was described as an achy pain in her epigastrium and periumbilical area.  It is not crampy.  She had no fever.  Patient has not been on any recent antibiotics.  He did not have any nausea or vomiting.  Patient reports that her dyspnea started the following day.  They had to put oxygen on her during her dialysis on Saturday.  She had missed a dialysis day previous to that due to other appointments, and then missed her make-up today due to the diarrhea.  She did go for full session on Saturday.  Patient does not make any urine at all.  Patient reports that her dyspnea was exertional.  Is not associated with a cough.  Patient has not had chest pain or palpitations.  Patient has no other complaints at this time.     Patient is a current smoker half pack per day.  She is currently working on quitting.  She does not drink alcohol.  Patient does smoke marijuana occasionally and  the last time was 8 months ago.  She is vaccinated for COVID.  Patient is full code.  HOSPITAL COURSE  Pt was admitted for hypertensive urgency secondary to volume overload from missing dialysis treatments and following a poor diet and fluid management.  Patient had findings on x-ray of pulmonary edema.  She was evaluated by nephrology and sent for hemodialysis where fortunately 4.5 L could be removed.  Her blood pressures immediately improved after that and her follow-up chest x-ray the next morning showed marked improvement to resolution of the edema.  Her blood pressures are much better now and she can discharge home.  She has hemodialysis outpatient scheduled for tomorrow and she was advised to go to her treatment tomorrow as scheduled.  Discharge Diagnoses:  Principal Problem:   Hypertensive crisis Active Problems:   ESRD (end stage renal disease) ([place])   PAD (peripheral artery disease) ([facility])   Tobacco use disorder   GERD (gastroesophageal reflux disease)   Abdominal pain   Discharge Instructions:  Allergies as of 10/25/2022   No Known Allergies      Medication List     TAKE these medications    acetaminophen 325 MG tablet Commonly known as: TYLENOL Take 2 tablets (650 mg total) by mouth every 6 (six) hours as needed for mild pain (or Fever >/= 101).   b complex-vitamin c-folic acid 0.8 MG Tabs tablet Take 1 tablet by mouth every Monday, Wednesday, and Friday with hemodialysis.   carvedilol 25 MG tablet Commonly known  as: COREG Take 1 tablet (25 mg total) by mouth 2 (two) times daily.   cloNIDine 0.3 MG tablet Commonly known as: CATAPRES Take 1 tablet (0.3 mg total) by mouth 3 (three) times daily.   hydrALAZINE 10 MG tablet Commonly known as: APRESOLINE Take 10 mg by mouth in the morning and at bedtime.   ondansetron 4 MG tablet Commonly known as: ZOFRAN Take 4 mg by mouth every 8 (eight) hours as needed for nausea or vomiting.   pantoprazole 40 MG  tablet Commonly known as: PROTONIX Take 1 tablet (40 mg total) by mouth daily.   senna 8.6 MG Tabs tablet Commonly known as: SENOKOT Take 1 tablet (8.6 mg total) by mouth daily as needed for mild constipation.   sevelamer carbonate 800 MG tablet Commonly known as: RENVELA Take 3 tablets by mouth in the morning, at noon, and at bedtime.   simethicone 80 MG chewable tablet Commonly known as: MYLICON Chew 2 tablets by mouth 2 (two) times daily.   traZODone 150 MG tablet Commonly known as: DESYREL Take 150 mg by mouth at bedtime.        Follow-up Information     [name], NP. Schedule an appointment as soon as possible for a visit in 1 week(s).   Specialty: Nurse Practitioner Why: Hospital Follow Up Contact information: [place] [address] [phone]         Outpatient Dialysis Treatment. Go in 1 day(s).   Why: tomorrow for your dialysis treatment               No Known Allergies Allergies as of 10/25/2022   No Known Allergies      Medication List     TAKE these medications    acetaminophen 325 MG tablet Commonly known as: TYLENOL Take 2 tablets (650 mg total) by mouth every 6 (six) hours as needed for mild pain (or Fever >/= 101).   b complex-vitamin c-folic acid 0.8 MG Tabs tablet Take 1 tablet by mouth every Monday, Wednesday, and Friday with hemodialysis.   carvedilol 25 MG tablet Commonly known as: COREG Take 1 tablet (25 mg total) by mouth 2 (two) times daily.   cloNIDine 0.3 MG tablet Commonly known as: CATAPRES Take 1 tablet (0.3 mg total) by mouth 3 (three) times daily.   hydrALAZINE 10 MG tablet Commonly known as: APRESOLINE Take 10 mg by mouth in the morning and at bedtime.   ondansetron 4 MG tablet Commonly known as: ZOFRAN Take 4 mg by mouth every 8 (eight) hours as needed for nausea or vomiting.   pantoprazole 40 MG tablet Commonly known as: PROTONIX Take 1 tablet (40 mg total) by mouth daily.    senna 8.6 MG Tabs tablet Commonly known as: SENOKOT Take 1 tablet (8.6 mg total) by mouth daily as needed for mild constipation.   sevelamer carbonate 800 MG tablet Commonly known as: RENVELA Take 3 tablets by mouth in the morning, at noon, and at bedtime.   simethicone 80 MG chewable tablet Commonly known as: MYLICON Chew 2 tablets by mouth 2 (two) times daily.   traZODone 150 MG tablet Commonly known as: DESYREL Take 150 mg by mouth at bedtime.        Procedures/Studies: DG CHEST PORT 1 VIEW  Result Date: 10/25/2022 CLINICAL DATA:  [id number] with pulmonary edema, [phone] with volume overload. EXAM: PORTABLE CHEST 1 VIEW COMPARISON:  AP Lat  chest yesterday at 3:04 a.m. FINDINGS: 5:05 a.m.  The heart is moderately  enlarged. The mediastinal configuration is stable with calcification of the transverse aorta. A large bore infusion catheter again enters the right atrium from inferiorly. There is mild central vascular prominence. Subpleural interstitial edema is nearly resolved from the lower lung fields since yesterday's exam. Minimal pleural effusions are still present. Patchy hazy opacities of the lung bases are present and could reflect clearing edema and/or pneumonitis, with improvement. The remainder of the lungs are clear.  Thoracic cage is intact. IMPRESSION: 1. Cardiomegaly with mild central vascular prominence. 2. Patchy hazy opacities of the lung bases which could reflect clearing edema and/or pneumonitis, with improvement. 3. Subpleural interstitial edema is nearly resolved. Electronically Signed   By: Telford Nab M.D.   On: 10/25/2022 06:20   CT VENOGRAM CHEST  Result Date: 10/24/2022 CLINICAL DATA:  ESRD on HD with challenging access. TO EVALUATE EXTENT OF OCCLUSION AND POSSIBILITIES FOR RECANALIZATION. EXAM: CT VENOGRAM CHEST WITH CONTRAST TECHNIQUE: Multidetector CT imaging of the chest was performed using the standard protocol during bolus administration of intravenous  contrast. Multiplanar CT image reconstructions and MIPs were obtained to evaluate the vascular anatomy. RADIATION DOSE REDUCTION: This exam was performed according to the departmental dose-optimization program which includes automated exposure control, adjustment of the mA and/or kV according to patient size and/or use of iterative reconstruction technique. CONTRAST:  174mL OMNIPAQUE IOHEXOL 350 MG/ML SOLN COMPARISON:  CT chest 07/13/2022. Chest XR, 10/24/2022. IR fluoroscopy, 01/28/2021. FINDINGS: Cardiovascular: *Satisfactory opacification of the venous system including the pulmonary arteries. No central or larger embolus. *Dilated main PA, measuring 4.1 cm *Cardiomegaly with LEFT ventricular hypertrophy. Severe multivessel coronary atherosclerosis. No pericardial effusion. * LEFT aortic arch and common "bovine" variant, with shared origin of the brachiocephalic and LEFT common carotid arteries. *Contrast bolus via the LEFT upper extremity. Patent LEFT upper extremity venous drainage, with well-opacified axillary, subclavian and brachiocephalic veins. The LEFT internal jugular vein is not well evaluated. *Poorly opacified RIGHT axillary and subclavian veins with a diminutive RIGHT brachiocephalic and imaged portions of the internal jugular vein, with calcifications, consistent with chronic stenosis. *Calcification with chronic occlusion at the confluence of the brachiocephalic veins and proximal SVC, measuring at least 2.0 cm. *Extensive superficial venous collaterals within the imaged portions of the upper extremities, chest and abdomen, with thoracic venous drainage eventually draining into the azygous and distal SVC past the central venous occlusion. *Patent distal IVC with transfemoral hemodialysis catheter, with tip within the RIGHT atrium. Mediastinum/Nodes: No enlarged mediastinal, hilar, or axillary lymph nodes. Thyroid gland, trachea, and esophagus demonstrate no significant findings. Lungs/Pleura: Small  volume pleural effusion with adjacent dependent atelectasis. No additional focal consolidation. No suspicious pulmonary nodule or mass. No pneumothorax. Upper Abdomen: Small volume perihepatic and trace perisplenic ascites. Senescent atrophic kidneys, including cystic degeneration, consistent with medical renal disease. Aortic atherosclerosis. Musculoskeletal: Extensive superficial venous collaterals within the imaged chest and abdomen. Moderate body wall edema consistent with anasarca. Renal osteodystrophy. No acute osseous abnormality. Review of the MIP images confirms the above findings. IMPRESSION: 1. 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 2. Extensive superficial venous collaterals within the imaged upper extremities, chest and abdomen, with thoracic venous drainage via the azygous and into the distal SVC past the central venous occlusion. 3. Patent distal IVC with well-positioned transfemoral hemodialysis catheter, with tip within the RIGHT atrium. 4. Main pulmonary artery dilation, likely reflecting underlying pulmonary arterial hypertension. 5. Anasarca including  body edema, small volume RIGHT pleural effusion and small volume intra-abdominal ascites. 6. Medical renal disease with renal osteodystrophy severe multivessel coronary atherosclerosis and aortic Atherosclerosis (ICD10-I70.0). Additional incidental, chronic and senescent findings as above. Electronically Signed   By: Michaelle Birks M.D.   On: 10/24/2022 09:06   US RENAL  Result Date: 10/24/2022 CLINICAL DATA:  Renal cyst on CT EXAM: RENAL / URINARY TRACT ULTRASOUND COMPLETE COMPARISON:  Multiple priors including CT October 24, 2022 July 07, 2022. FINDINGS: Right Kidney: Renal measurements: 8.4 x 4.4 x 3.8 cm = volume: 74 mL. Increased echogenicity. Well-circumscribed exophytic upper pole hypoechoic  lesion measures 2.5 x 2.2 x 2.0 cm which demonstrates some internal echoes on CT July 07, 2022 this measures fluid density but on most recent examination measured Hounsfield units of 31. Numerous anechoic renal lesions measure up to 2.1 cm and are compatible with cysts. Left Kidney: Renal measurements: 7.2 x 4.0 x 4.0 cm = volume: 58 mL. Increased echogenicity. Multiple anechoic renal lesions compatible with cysts measure up to 2.3 cm. Bladder: Appears normal for degree of bladder distention. Other: Right pleural effusion.  Ascites. IMPRESSION: 1. Well-circumscribed 2.5 cm exophytic right upper pole hypoechoic lesion demonstrates some internal echoes, on CT July 07, 2022 this measures fluid density but on current CT measured Hounsfield units of 31. Findings which are most compatible with a complex cyst likely with hemorrhagic/proteinaceous debris. Suggest follow-up ultrasound in 3-6 months. 2. Additional bilateral simple appearing renal cysts. 3. Bilateral echogenic kidneys compatible with medical renal disease. 4. Right pleural effusion. Ascites. Electronically Signed   By: Dahlia Bailiff M.D.   On: 10/24/2022 08:38   CT ABDOMEN PELVIS W CONTRAST  Result Date: 10/24/2022 CLINICAL DATA:  Epigastric pain. EXAM: CT ABDOMEN AND PELVIS WITH CONTRAST TECHNIQUE: Multidetector CT imaging of the abdomen and pelvis was performed using the standard protocol following bolus administration of intravenous contrast. RADIATION DOSE REDUCTION: This exam was performed according to the departmental dose-optimization program which includes automated exposure control, adjustment of the mA and/or kV according to patient size and/or use of iterative reconstruction technique. CONTRAST:  120mL OMNIPAQUE IOHEXOL 300 MG/ML  SOLN COMPARISON:  07/07/2022. FINDINGS: Lower chest: The heart is enlarged and there is a small pericardial effusion. Coronary artery calcifications are noted. There is a small loculated pleural effusion at the right  lung base. Ground-glass opacities are noted at the lung bases bilaterally. Hepatobiliary: A stable cyst is present in the inferior aspect of the right lobe of the liver. No biliary ductal dilatation. Stones and sludge are present in the gallbladder. Pancreas: No pancreatic ductal dilatation or surrounding inflammatory changes. There is a cyst in the tail of the pancreas measuring 1.7 cm, unchanged. Spleen: Normal in size without focal abnormality. Adrenals/Urinary Tract: No adrenal nodule or mass. Renal atrophy and cysts are noted bilaterally. There is a hyperdense lesion in the upper pole of the right kidney measuring 2.5 cm. Vascular calcifications are present at the renal hila bilaterally. No hydronephrosis. The bladder is decompressed. Stomach/Bowel: Stomach is within normal limits. Appendix appears normal. No evidence of bowel wall thickening, distention, or inflammatory changes. No free air or pneumatosis. A few scattered diverticula are present along the colon without evidence of diverticulitis. Vascular/Lymphatic: There is reflux of contrast into the inferior vena cava. A right femoral central venous catheter is noted in the right atrium an extends out of the field of view. Aortic atherosclerosis. There is suboptimal opacification of the distal abdominal aorta and branch vessels on this  exam. 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 No abdominal or pelvic lymphadenopathy. Reproductive: Uterus and bilateral adnexa are unremarkable. Other: Moderate ascites. Multiple abdominal wall varices are noted. Diffuse anasarca. Musculoskeletal: Degenerative changes are present in the thoracolumbar spine. No acute osseous abnormality. IMPRESSION: 1. No bowel obstruction or free air. 2. Hazy ground-glass opacities at the lung bases, suggesting edema. 3. Small loculated right pleural effusion. 4. Cardiomegaly with small pericardial effusion and coronary artery  calcifications. 5. Moderate ascites and anasarca. 6. Stable cyst in the pancreatic tail. Recommend nonemergent contrast enhanced MRCP for further evaluation. 7. Bilateral renal atrophy and cysts. A complex lesion is present in the upper pole of the right kidney. Ultrasound is recommended for further characterization on nonemergent follow-up. 8. Aortic atherosclerosis. Electronically Signed   By: Brett Fairy M.D.   On: 10/24/2022 03:38   DG Chest 2 View  Result Date: 10/24/2022 CLINICAL DATA:  Dyspnea EXAM: CHEST - 2 VIEW COMPARISON:  08/29/2022, 07/18/2022 FINDINGS: Cardiomegaly. Heterogeneous opacities and pulmonary congestion, likely mild pulmonary edema. No definite pleural effusion or pneumothorax. No acute osseous abnormality. IMPRESSION: Cardiomegaly with mild pulmonary edema, although a superimposed infectious process cannot be excluded. Electronically Signed   By: Merilyn Baba M.D.   On: 10/24/2022 03:17     Subjective: Pt feeling much better after HD where 4L fluid removed.  BPs much better.   Discharge Exam: Vitals:   10/25/22 0900 10/25/22 0949  BP: (!) 146/77 (!) 146/77  Pulse: 70 72  Resp: 11 14  Temp:    SpO2: 100% 100%   Vitals:   10/25/22 0700 10/25/22 0800 10/25/22 0900 10/25/22 0949  BP: (!) 155/103 (!) 144/77 (!) 146/77 (!) 146/77  Pulse: 72 73 70 72  Resp: 11 11 11 14   Temp: (!) 97.4 F (36.3 C)     TempSrc: Axillary     SpO2: 100% 100% 100% 100%  Weight:      Height:       General: Pt is alert, awake, not in acute distress Cardiovascular: RRR, S1/S2 +, no rubs, no gallops Respiratory: no increased work of breathing.  Abdominal: Soft, NT, ND, bowel sounds + Extremities: no edema, no cyanosis   The results of significant diagnostics from this hospitalization (including imaging, microbiology, ancillary and laboratory) are listed below for reference.     Microbiology: Recent Results (from the past 240 hour(s))  MRSA Next Gen by PCR, Nasal     Status:  Abnormal   Collection Time: 10/24/22  1:06 PM   Specimen: Nasal Mucosa; Nasal Swab  Result Value Ref Range Status   MRSA by PCR Next Gen DETECTED (A) NOT DETECTED Final    Comment: RESULT CALLED TO, READ BACK BY AND VERIFIED WITH: B FOLEY RN [phone] [id number] K FORSYTH (NOTE) The GeneXpert MRSA Assay (FDA approved for NASAL specimens only), is one component of a comprehensive MRSA colonization surveillance program. It is not intended to diagnose MRSA infection nor to guide or monitor treatment for MRSA infections. Test performance is not FDA approved in patients less than 47 years old. Performed at [facility], [address], [place], [place] [address]      Labs: BNP (last 3 results) Recent Labs    07/07/22 1107  BNP 0,347.4*   Basic Metabolic Panel: Recent Labs  Lab 10/20/22 1130 10/24/22 0208 10/25/22 0427 10/25/22 0428  NA  --  135  --  135  K  --  4.7  --  4.2  CL  --  95*  --  97*  CO2  --  25  --  24  GLUCOSE  --  116*  --  87  BUN  --  63*  --  42*  CREATININE 13.00* 12.34*  --  9.24*  CALCIUM  --  9.9  --  9.9  MG  --   --  1.8  --   PHOS  --   --   --  5.1*   Liver Function Tests: Recent Labs  Lab 10/24/22 0208 10/25/22 0428  AST 15  --   ALT 9  --   ALKPHOS 46  --   BILITOT 1.1  --   PROT 7.1  --   ALBUMIN 3.9 3.8   Recent Labs  Lab 10/24/22 0208  LIPASE 27   No results for input(s): "AMMONIA" in the last 168 hours. CBC: Recent Labs  Lab 10/24/22 0208 10/25/22 0427  WBC 4.8 4.0  NEUTROABS 3.7  --   HGB 9.4* 9.1*  HCT 31.1* 30.4*  MCV 84.5 84.4  PLT 130* 144*   Cardiac Enzymes: No results for input(s): "CKTOTAL", "CKMB", "CKMBINDEX", "TROPONINI" in the last 168 hours. BNP: Invalid input(s): "POCBNP" CBG: No results for input(s): "GLUCAP" in the last 168 hours. D-Dimer No results for input(s): "DDIMER" in the last 72 hours. Hgb A1c No results for input(s): "HGBA1C" in the last 72 hours. Lipid Profile No results for input(s):  "CHOL", "HDL", "LDLCALC", "TRIG", "CHOLHDL", "LDLDIRECT" in the last 72 hours. Thyroid function studies Recent Labs    10/25/22 0428  TSH 5.992*   Anemia work up No results for input(s): "VITAMINB12", "FOLATE", "FERRITIN", "TIBC", "IRON", "RETICCTPCT" in the last 72 hours. Urinalysis No results found for: "COLORURINE", "APPEARANCEUR", "LABSPEC", "PHURINE", "GLUCOSEU", "HGBUR", "BILIRUBINUR", "KETONESUR", "PROTEINUR", "UROBILINOGEN", "NITRITE", "LEUKOCYTESUR" Sepsis Labs Recent Labs  Lab 10/24/22 0208 10/25/22 0427  WBC 4.8 4.0   Microbiology Recent Results (from the past 240 hour(s))  MRSA Next Gen by PCR, Nasal     Status: Abnormal   Collection Time: 10/24/22  1:06 PM   Specimen: Nasal Mucosa; Nasal Swab  Result Value Ref Range Status   MRSA by PCR Next Gen DETECTED (A) NOT DETECTED Final    Comment: RESULT CALLED TO, READ BACK BY AND VERIFIED WITH: B FOLEY RN [phone] [id number] K FORSYTH (NOTE) The GeneXpert MRSA Assay (FDA approved for NASAL specimens only), is one component of a comprehensive MRSA colonization surveillance program. It is not intended to diagnose MRSA infection nor to guide or monitor treatment for MRSA infections. Test performance is not FDA approved in patients less than 56 years old. Performed at [facility], [address], [place], [place] [address]    Time coordinating discharge: 32 mins  SIGNED:  Irwin Brakeman, MD  [facility] 10/25/2022, 10:26 AM How to contact the [facility] Attending or Consulting provider [place] or covering provider during after hours [place], for this patient?  Check the care team in [facility] and look for a) attending/consulting [facility] provider listed and b) the [facility] team listed Log into [email or web address] and use [place]'s universal password to access. If you do not have the password, please contact the hospital operator. Locate the [facility] provider you are looking for under [facility] and page to a number that you can be directly  reached. If you still have difficulty reaching the provider, please page the [facility] (Director on Call) for the Hospitalists listed on amion for assistance.

## 2022-10-25 NOTE — TOC Transition Note (Signed)
Transition of Care [facility]) - CM/SW Discharge Note   Patient Details  Name: Rebekah Burton MRN: [id number] Date of Birth: 09-12-1976  [facility] CM/SW Contact:  Ihor Gully, LCSW Phone Number: 10/25/2022, 2:01 PM   Clinical Narrative:    Patient from [place]. Observation for hypertensive crisis. Discharging back to facility, facility to provide transport. Discharge clinicals sent to facility. TOC signing off.   Final next level of care: Group Home Barriers to Discharge: No Barriers Identified   Patient Goals and CMS Choice        Discharge Placement                Patient to be transferred to facility by: facility Name of family member notified: Jamas Lav, facility administrator Patient and family notified of of transfer: 10/25/22  Discharge Plan and Services                                     Social Determinants of Health (SDOH) Interventions     Readmission Risk Interventions    03/16/2021    2:06 PM  Readmission Risk Prevention Plan  Transportation Screening Complete  PCP or Specialist Appt within 5-7 Days Complete  Home Care Screening Complete  Medication Review (RN CM) Complete

## 2022-11-01 ENCOUNTER — Ambulatory Visit: Payer: Medicare HMO | Admitting: Vascular Surgery

## 2022-11-07 ENCOUNTER — Encounter (HOSPITAL_COMMUNITY): Payer: Self-pay | Admitting: Emergency Medicine

## 2022-11-07 ENCOUNTER — Emergency Department (HOSPITAL_COMMUNITY): Payer: Medicare HMO

## 2022-11-07 ENCOUNTER — Other Ambulatory Visit: Payer: Self-pay

## 2022-11-07 ENCOUNTER — Emergency Department (HOSPITAL_COMMUNITY)
Admission: EM | Admit: 2022-11-07 | Discharge: 2022-11-07 | Disposition: A | Discharge status: Home / Self Care | Payer: Medicare HMO | Attending: Emergency Medicine | Admitting: Emergency Medicine

## 2022-11-07 DIAGNOSIS — I120 Hypertensive chronic kidney disease with stage 5 chronic kidney disease or end stage renal disease: Secondary | ICD-10-CM | POA: Insufficient documentation

## 2022-11-07 DIAGNOSIS — R5381 Other malaise: Secondary | ICD-10-CM | POA: Diagnosis not present

## 2022-11-07 DIAGNOSIS — Z992 Dependence on renal dialysis: Secondary | ICD-10-CM | POA: Diagnosis not present

## 2022-11-07 DIAGNOSIS — R197 Diarrhea, unspecified: Secondary | ICD-10-CM | POA: Insufficient documentation

## 2022-11-07 DIAGNOSIS — I159 Secondary hypertension, unspecified: Secondary | ICD-10-CM | POA: Insufficient documentation

## 2022-11-07 DIAGNOSIS — R5383 Other fatigue: Secondary | ICD-10-CM | POA: Insufficient documentation

## 2022-11-07 DIAGNOSIS — R519 Headache, unspecified: Secondary | ICD-10-CM | POA: Diagnosis present

## 2022-11-07 DIAGNOSIS — G44219 Episodic tension-type headache, not intractable: Secondary | ICD-10-CM | POA: Insufficient documentation

## 2022-11-07 DIAGNOSIS — N186 End stage renal disease: Secondary | ICD-10-CM | POA: Insufficient documentation

## 2022-11-07 LAB — CBC WITH DIFFERENTIAL/PLATELET
Abs Immature Granulocytes: 0.01 10*3/uL (ref 0.00–0.07)
Basophils Absolute: 0.1 10*3/uL (ref 0.0–0.1)
Basophils Relative: 1 %
Eosinophils Absolute: 0.3 10*3/uL (ref 0.0–0.5)
Eosinophils Relative: 5 %
HCT: 31.1 % — ABNORMAL LOW (ref 36.0–46.0)
Hemoglobin: 9.3 g/dL — ABNORMAL LOW (ref 12.0–15.0)
Immature Granulocytes: 0 %
Lymphocytes Relative: 8 %
Lymphs Abs: 0.4 10*3/uL — ABNORMAL LOW (ref 0.7–4.0)
MCH: 25.3 pg — ABNORMAL LOW (ref 26.0–34.0)
MCHC: 29.9 g/dL — ABNORMAL LOW (ref 30.0–36.0)
MCV: 84.7 fL (ref 80.0–100.0)
Monocytes Absolute: 0.4 10*3/uL (ref 0.1–1.0)
Monocytes Relative: 7 %
Neutro Abs: 3.7 10*3/uL (ref 1.7–7.7)
Neutrophils Relative %: 79 %
Platelets: 136 10*3/uL — ABNORMAL LOW (ref 150–400)
RBC: 3.67 MIL/uL — ABNORMAL LOW (ref 3.87–5.11)
RDW: 18.6 % — ABNORMAL HIGH (ref 11.5–15.5)
WBC: 4.8 10*3/uL (ref 4.0–10.5)
nRBC: 0 % (ref 0.0–0.2)

## 2022-11-07 LAB — BASIC METABOLIC PANEL
Anion gap: 14 (ref 5–15)
BUN: 71 mg/dL — ABNORMAL HIGH (ref 6–20)
CO2: 21 mmol/L — ABNORMAL LOW (ref 22–32)
Calcium: 10.5 mg/dL — ABNORMAL HIGH (ref 8.9–10.3)
Chloride: 101 mmol/L (ref 98–111)
Creatinine, Ser: 13.37 mg/dL — ABNORMAL HIGH (ref 0.44–1.00)
GFR, Estimated: 3 mL/min — ABNORMAL LOW (ref >60)
Glucose, Bld: 106 mg/dL — ABNORMAL HIGH (ref 70–99)
Potassium: 5.2 mmol/L — ABNORMAL HIGH (ref 3.5–5.1)
Sodium: 136 mmol/L (ref 135–145)

## 2022-11-07 MED ORDER — PROCHLORPERAZINE EDISYLATE 10 MG/2ML IJ SOLN
5.0000 mg | Freq: Once | INTRAMUSCULAR | Status: AC
  Administered 2022-11-07: 5 mg via INTRAVENOUS
  Filled 2022-11-07: qty 2

## 2022-11-07 MED ORDER — DIPHENHYDRAMINE HCL 50 MG/ML IJ SOLN
25.0000 mg | Freq: Once | INTRAMUSCULAR | Status: AC
  Administered 2022-11-07: 25 mg via INTRAVENOUS
  Filled 2022-11-07: qty 1

## 2022-11-07 MED ORDER — HYDRALAZINE HCL 25 MG PO TABS
25.0000 mg | ORAL_TABLET | Freq: Once | ORAL | Status: AC
  Administered 2022-11-07: 25 mg via ORAL
  Filled 2022-11-07: qty 1

## 2022-11-07 NOTE — ED Notes (Signed)
Pt also c/o headache, edp at bedside at this time

## 2022-11-07 NOTE — ED Triage Notes (Signed)
Pt c/o diarrhea x 4 days. Missed Friday and today dialysis due to diarrhea. Abd pain to r and L upper abd. Pt c/o sob at this time. Pt a/o. Blind. No sob noted. Cbg 139 en route.

## 2022-11-07 NOTE — Progress Notes (Deleted)
Vascular and Vein Specialist of [place]  Patient name: Rebekah [facility] MRN: [id number] DOB: 1976-07-23 Sex: female  REASON FOR VISIT: Follow-up recent excision of left arm AV access for abscess with MRSA  HPI: Rebekah [facility] is a 46 y.o. female here today for follow-up and staple remover.  She presented to [facility] with sepsis.  She had bleeding from erosion and a left upper arm AV access.  She was taken to the operating room for excision of this and had a temporary catheter placed.  She actually had cardiac arrest in the operating room and was successfully resuscitated.  He excised the involved portion of the access and closed her with staples.  She is here today for removal of this.  She reports continued discomfort in her left upper arm.  She also reports swelling and discomfort in her right arm.  She is here today with her caregiver.  In reviewing her chart.  This left arm access was her only prior access.  This was initially placed in [facility] a decade ago.  She had had interventions including percutaneous thrombectomy by interventional radiology in 2021.  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  Despite this she continues to have access use in her left upper arm.  She now has a right thigh tunneled catheter.  09/20/22: Patient presents to discuss options for dialysis access going forward.  She is currently dialyzing through a right femoral vein tunneled dialysis catheter.  Current Outpatient Medications  Medication Sig Dispense Refill   acetaminophen (TYLENOL) 325 MG tablet Take 2 tablets (650 mg total) by mouth every 6 (six) hours as needed for mild pain (or Fever >/= 101).     b complex-vitamin c-folic acid (NEPHRO-VITE) 0.8 MG TABS tablet Take 1 tablet by mouth every Monday, Wednesday, and Friday with hemodialysis. (Patient not taking: Reported on 11/07/2022) 30 tablet 3   carvedilol  (COREG) 25 MG tablet Take 1 tablet (25 mg total) by mouth 2 (two) times daily. 60 tablet 3   cloNIDine (CATAPRES) 0.3 MG tablet Take 1 tablet (0.3 mg total) by mouth 3 (three) times daily. 90 tablet 3   hydrALAZINE (APRESOLINE) 10 MG tablet Take 10 mg by mouth in the morning and at bedtime.     ondansetron (ZOFRAN) 4 MG tablet Take 4 mg by mouth every 8 (eight) hours as needed for nausea or vomiting.     pantoprazole (PROTONIX) 40 MG tablet Take 1 tablet (40 mg total) by mouth daily. 30 tablet 3   senna (SENOKOT) 8.6 MG TABS tablet Take 1 tablet (8.6 mg total) by mouth daily as needed for mild constipation.     sevelamer carbonate (RENVELA) 800 MG tablet Take 3 tablets by mouth in the morning, at noon, and at bedtime.     simethicone (MYLICON) 80 MG chewable tablet Chew 2 tablets by mouth 2 (two) times daily.     traZODone (DESYREL) 150 MG tablet Take 150 mg by mouth at bedtime.     No current facility-administered medications for this visit.     PHYSICAL EXAM: There were no vitals filed for this visit.   Chronically ill middle-aged woman in no acute distress Prominent venous collaterals across the chest wall Regular rate and rhythm Unlabored breathing Well-healed scar in the left arm Right femoral tunneled dialysis catheter in place.  MEDICAL ISSUES: Hemodialysis access going forward for her will be challenging.  I will order a CT venogram of  her chest to evaluate the central circulation.  I am hopeful that I can recanalize her central veins endovascularly, or create a hero graft for her.  A right femoral loop graft is an option, but with her history of peripheral arterial disease requiring bypass on the left, I am worried about pursuing this option.  We will call her with the results of her CT venogram and we will strategize together.  [name], MD Vascular and Vein Specialists of [facility] Phone Number: [phone] 11/07/2022 8:53 PM   Note: Portions of this  report may have been transcribed using voice recognition software.  Every effort has been made to ensure accuracy; however, inadvertent computerized transcription errors may still be present.

## 2022-11-07 NOTE — ED Provider Notes (Signed)
[facility] EMERGENCY DEPARTMENT Provider Note   CSN: [id number] Arrival date & time: 11/07/22  9381     History Chief Complaint  Patient presents with   Shortness of Breath    HPI Rebekah [facility] is a 46 y.o. female presenting for shortness of breath.  Patient has an extensive medical history including hypertension, PAD, ESRD, MRSA bacteremia in the past. She states that over the weekend she had multiple symptoms.  She endorses fatigue, diarrhea 3-4 times per day nonbilious nonbloody, intermittent cramping abdominal pain currently not present, intermittent headache that feels like a migraine she had when she was younger and generalized malaise and fatigue.  She is scheduled for dialysis at 1130 today and missed dialysis on Friday because of all of the symptoms.  She stated that she never misses dialysis and that she attributes much of her symptoms to this missed session. Fortunately, this morning the diarrhea has improved, she states that she wanted to be evaluated before going to dialysis today. Symptoms grossly mild at this time.   Patient's recorded medical, surgical, social, medication list and allergies were reviewed in the Snapshot window as part of the initial history.   Review of Systems   Review of Systems  Constitutional:  Positive for fatigue. Negative for chills and fever.  HENT:  Negative for ear pain and sore throat.   Eyes:  Negative for pain and visual disturbance.  Respiratory:  Positive for shortness of breath. Negative for cough.   Cardiovascular:  Negative for chest pain and palpitations.  Gastrointestinal:  Positive for abdominal pain and diarrhea. Negative for vomiting.  Genitourinary:  Negative for dysuria and hematuria.  Musculoskeletal:  Negative for arthralgias and back pain.  Skin:  Negative for color change and rash.  Neurological:  Positive for headaches. Negative for seizures and syncope.  All other systems reviewed and are negative.   Physical  Exam Updated Vital Signs BP (!) 204/153   Pulse 79   Temp 97.8 F (36.6 C) (Oral)   Resp 19   SpO2 95%  Physical Exam Vitals and nursing note reviewed.  Constitutional:      General: She is not in acute distress.    Appearance: She is well-developed.  HENT:     Head: Normocephalic and atraumatic.  Eyes:     Conjunctiva/sclera: Conjunctivae normal.  Cardiovascular:     Rate and Rhythm: Normal rate and regular rhythm.     Heart sounds: No murmur heard. Pulmonary:     Effort: Pulmonary effort is normal. No respiratory distress.     Breath sounds: Normal breath sounds.  Abdominal:     Palpations: Abdomen is soft.     Tenderness: There is no abdominal tenderness.  Musculoskeletal:        General: No swelling.     Cervical back: Neck supple.  Skin:    General: Skin is warm and dry.     Capillary Refill: Capillary refill takes less than 2 seconds.  Neurological:     Mental Status: She is alert.  Psychiatric:        Mood and Affect: Mood normal.      ED Course/ Medical Decision Making/ A&P    Procedures Procedures   Medications Ordered in ED Medications  hydrALAZINE (APRESOLINE) tablet 25 mg (25 mg Oral Given 11/07/22 0857)  prochlorperazine (COMPAZINE) injection 5 mg (5 mg Intravenous Given 11/07/22 0858)  diphenhydrAMINE (BENADRYL) injection 25 mg (25 mg Intravenous Given 11/07/22 0858)    Medical Decision Making:  [name] is a 46 y.o. female who presented to the ED today with diffuse, multiple concerns detailed above.     Handoff received from EMS.  Patient's presentation is complicated by their history of multiple comorbid medical conditions.  Patient placed on continuous vitals and telemetry monitoring while in ED which was reviewed periodically.   Complete initial physical exam performed, notably the patient  was hemodynamically stable in no acute distress.  Patient is blind.  Patient is resting comfortably in bed.  No focal abdominal tenderness or  guarding.  No CVATenderness.      Reviewed and confirmed nursing documentation for past medical history, family history, social history.    Initial Assessment:   With the patient's presentation of fatigue, shortness of breath, malaise in the setting of diarrhea, most likely diagnosis is diffuse viral illness. Other diagnoses were considered including (but not limited to) appendicitis, cholecystitis, pancreatitis, metabolic emergency given missed dialysis. These are considered less likely due to history of present illness and physical exam findings.   She additionally meets criteria for hypertensive urgency.  Does not appear to be hypertensive emergency based on lack of any other focal pathology though this will be evaluated as below. This is most consistent with an acute life/limb threatening illness complicated by underlying chronic conditions.  Initial Plan:  Screening labs including CBC and Metabolic panel to evaluate for infectious or metabolic etiology of disease.  CXR to evaluate for structural/infectious intrathoracic pathology. CT head to evaluate for any intracranial hemorrhages as an etiology of her intermittent headaches in the setting of hypertension EKG to evaluate for cardiac pathology. Objective evaluation as below reviewed with plan for close reassessment after treatment with her home hydralazine this morning.  Initial Study Results:   Laboratory  All laboratory results reviewed without evidence of clinically relevant pathology.   Exceptions include: Multiple sequelae of ESRD.  EKG EKG was reviewed independently. Rate, rhythm, axis, intervals all examined and without medically relevant abnormality. ST segments without concerns for elevations.    Radiology  All images reviewed independently. Agree with radiology report at this time.   CT HEAD WO CONTRAST (5MM)  Result Date: 11/07/2022 CLINICAL DATA:  Headache. EXAM: CT HEAD WITHOUT CONTRAST TECHNIQUE: Contiguous axial  images were obtained from the base of the skull through the vertex without intravenous contrast. RADIATION DOSE REDUCTION: This exam was performed according to the departmental dose-optimization program which includes automated exposure control, adjustment of the mA and/or kV according to patient size and/or use of iterative reconstruction technique. COMPARISON:  None Available. FINDINGS: Brain: A small cortical infarct in the left parietal lobe has associated mild volume loss and is favored to be chronic. A smaller, age-indeterminate cortical infarct is suspected in the right parietal lobe. No acute intracranial hemorrhage, mass, midline shift, or extra-axial fluid collection is identified. Hypodensities in the cerebral white matter bilaterally are nonspecific but compatible with mild chronic small vessel ischemic disease. There is mild cerebral atrophy. Vascular: Calcified atherosclerosis at the skull base. No hyperdense vessel. Skull: No acute fracture or suspicious osseous lesion. Sinuses/Orbits: Visualized paranasal sinuses and mastoid air cells are clear. Visualized portions of the orbits are unremarkable. Other: None. IMPRESSION: 1. Small chronic left parietal infarct. 2. Suspected small age-indeterminate infarct in the right parietal lobe. 3. No intracranial hemorrhage. 4. Mild chronic small vessel ischemic disease and cerebral atrophy. Electronically Signed   By: Logan Bores M.D.   On: 11/07/2022 10:58   DG Chest Portable 1 View  Result Date: 11/07/2022 CLINICAL  DATA:  Shortness of breath EXAM: PORTABLE CHEST 1 VIEW COMPARISON:  Previous studies including the examination of 10/25/2022 FINDINGS: Transverse diameter of heart is increased. Central pulmonary vessels are prominent. There are no signs of alveolar pulmonary edema or new focal consolidation. There is some haziness in the left lower lung field and blunting of left lateral CP angle. There is no pneumothorax. IMPRESSION: Cardiomegaly. Central  pulmonary vessels are prominent without signs of alveolar pulmonary edema. Possible small left pleural effusion. Electronically Signed   By: Elmer Picker M.D.   On: 11/07/2022 09:49     Final Assessment and Plan:   Observe patient in emergency department until time for her dialysis session.  Given her well appearance at this time, improvement of symptoms, I believe patient is stable for outpatient dialysis.  No acute indication for admission.  Patient will be taken to this observed setting to be reassessed by staff there.  Patient likely stable for outpatient after her outpatient dialysis though informed she could be return to the emergency department if symptoms are continuous.   Disposition:  I have considered need for hospitalization, however, considering all of the above, I believe this patient is stable for discharge at this time.  Patient/family educated about specific return precautions for given chief complaint and symptoms.  Patient/family educated about follow-up with PCP and nephrology.     Patient/family expressed understanding of return precautions and need for follow-up. Patient spoken to regarding all imaging and laboratory results and appropriate follow up for these results. All education provided in verbal form with additional information in written form. Time was allowed for answering of patient questions. Patient discharged.    Emergency Department Medication Summary:   Medications  hydrALAZINE (APRESOLINE) tablet 25 mg (25 mg Oral Given 11/07/22 0857)  prochlorperazine (COMPAZINE) injection 5 mg (5 mg Intravenous Given 11/07/22 0858)  diphenhydrAMINE (BENADRYL) injection 25 mg (25 mg Intravenous Given 11/07/22 0858)         Clinical Impression:  1. Nonintractable episodic headache, unspecified headache type   2. Secondary hypertension      Discharge   Final Clinical Impression(s) / ED Diagnoses Final diagnoses:  Nonintractable episodic headache, unspecified  headache type  Secondary hypertension    Rx / DC Orders ED Discharge Orders     None         Tretha Sciara, MD 11/07/22 1102

## 2022-11-08 ENCOUNTER — Ambulatory Visit: Payer: Medicaid Other | Admitting: Vascular Surgery

## 2022-11-22 ENCOUNTER — Ambulatory Visit: Payer: Medicare HMO | Admitting: Vascular Surgery

## 2022-12-05 NOTE — Progress Notes (Unsigned)
Vascular and Vein Specialist of [place]  Patient name: Rebekah [facility] MRN: [id number] DOB: 1976-10-11 Sex: female  REASON FOR VISIT: Follow-up recent excision of left arm AV access for abscess with MRSA  HPI: Marcine Gadway is a 46 y.o. female here today for follow-up and staple remover.  She presented to [facility] with sepsis.  She had bleeding from erosion and a left upper arm AV access.  She was taken to the operating room for excision of this and had a temporary catheter placed.  She actually had cardiac arrest in the operating room and was successfully resuscitated.  He excised the involved portion of the access and closed her with staples.  She is here today for removal of this.  She reports continued discomfort in her left upper arm.  She also reports swelling and discomfort in her right arm.  She is here today with her caregiver.  In reviewing her chart.  This left arm access was her only prior access.  This was initially placed in [facility] a decade ago.  She had had interventions including percutaneous thrombectomy by interventional radiology in 2021.  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  Despite this she continues to have access use in her left upper arm.  She now has a right thigh tunneled catheter.  09/20/22: Patient presents to discuss options for dialysis access going forward.  She is currently dialyzing through a right femoral vein tunneled dialysis catheter.  Current Outpatient Medications  Medication Sig Dispense Refill   acetaminophen (TYLENOL) 325 MG tablet Take 2 tablets (650 mg total) by mouth every 6 (six) hours as needed for mild pain (or Fever >/= 101).     b complex-vitamin c-folic acid (NEPHRO-VITE) 0.8 MG TABS tablet Take 1 tablet by mouth every Monday, Wednesday, and Friday with hemodialysis. (Patient not taking: Reported on 11/07/2022) 30 tablet 3   carvedilol  (COREG) 25 MG tablet Take 1 tablet (25 mg total) by mouth 2 (two) times daily. 60 tablet 3   cloNIDine (CATAPRES) 0.3 MG tablet Take 1 tablet (0.3 mg total) by mouth 3 (three) times daily. 90 tablet 3   hydrALAZINE (APRESOLINE) 10 MG tablet Take 10 mg by mouth in the morning and at bedtime.     ondansetron (ZOFRAN) 4 MG tablet Take 4 mg by mouth every 8 (eight) hours as needed for nausea or vomiting.     pantoprazole (PROTONIX) 40 MG tablet Take 1 tablet (40 mg total) by mouth daily. 30 tablet 3   senna (SENOKOT) 8.6 MG TABS tablet Take 1 tablet (8.6 mg total) by mouth daily as needed for mild constipation.     sevelamer carbonate (RENVELA) 800 MG tablet Take 3 tablets by mouth in the morning, at noon, and at bedtime.     simethicone (MYLICON) 80 MG chewable tablet Chew 2 tablets by mouth 2 (two) times daily.     traZODone (DESYREL) 150 MG tablet Take 150 mg by mouth at bedtime.     No current facility-administered medications for this visit.     PHYSICAL EXAM: There were no vitals filed for this visit.   Chronically ill middle-aged woman in no acute distress Prominent venous collaterals across the chest wall Regular rate and rhythm Unlabored breathing Well-healed scar in the left arm Right femoral tunneled dialysis catheter in place.  MEDICAL ISSUES: Hemodialysis access going forward for her will be challenging.  I will order a CT venogram of  her chest to evaluate the central circulation.  I am hopeful that I can recanalize her central veins endovascularly, or create a hero graft for her.  A right femoral loop graft is an option, but with her history of peripheral arterial disease requiring bypass on the left, I am worried about pursuing this option.  We will call her with the results of her CT venogram and we will strategize together.  [name], MD Vascular and Vein Specialists of [facility] Phone Number: [phone] 12/05/2022 10:12 AM   Note: Portions of this  report may have been transcribed using voice recognition software.  Every effort has been made to ensure accuracy; however, inadvertent computerized transcription errors may still be present.

## 2022-12-06 ENCOUNTER — Ambulatory Visit (INDEPENDENT_AMBULATORY_CARE_PROVIDER_SITE_OTHER): Payer: Medicare HMO | Admitting: Vascular Surgery

## 2022-12-06 ENCOUNTER — Encounter: Payer: Self-pay | Admitting: Vascular Surgery

## 2022-12-06 VITALS — BP 144/70 | HR 81 | Temp 100.6°F | Resp 20 | Ht 64.0 in | Wt 176.0 lb

## 2022-12-06 DIAGNOSIS — Z992 Dependence on renal dialysis: Secondary | ICD-10-CM

## 2022-12-06 DIAGNOSIS — N186 End stage renal disease: Secondary | ICD-10-CM

## 2022-12-08 ENCOUNTER — Telehealth: Payer: Self-pay

## 2022-12-08 NOTE — Telephone Encounter (Signed)
Blenda Nicely, NP from [facility] called to get clarification on pt's office visit with MD earlier this week. She is aware pt is to get scheduled for venogram, per MD office note. This has not been scheduled yet. No further questions/concerns at this time.

## 2022-12-12 ENCOUNTER — Other Ambulatory Visit: Payer: Self-pay

## 2022-12-12 DIAGNOSIS — N186 End stage renal disease: Secondary | ICD-10-CM

## 2022-12-12 MED ORDER — SODIUM CHLORIDE 0.9% FLUSH: 3.0000 mL | INTRAVENOUS | Status: AC | PRN

## 2022-12-12 MED ORDER — SODIUM CHLORIDE 0.9% FLUSH: 3.0000 mL | Freq: Two times a day (BID) | INTRAVENOUS | Status: AC

## 2022-12-12 MED ORDER — SODIUM CHLORIDE 0.9 % IV SOLN: 250.0000 mL | INTRAVENOUS | Status: AC | PRN

## 2022-12-25 ENCOUNTER — Inpatient Hospital Stay (HOSPITAL_COMMUNITY): Admission: RE | Admit: 2022-12-25 | Payer: Medicaid Other | Admitting: Internal Medicine

## 2022-12-25 ENCOUNTER — Encounter (HOSPITAL_COMMUNITY): Payer: Self-pay

## 2022-12-25 ENCOUNTER — Inpatient Hospital Stay (HOSPITAL_COMMUNITY): Payer: Medicare HMO

## 2022-12-25 ENCOUNTER — Emergency Department (HOSPITAL_COMMUNITY): Payer: Medicare HMO

## 2022-12-25 ENCOUNTER — Other Ambulatory Visit: Payer: Self-pay

## 2022-12-25 ENCOUNTER — Inpatient Hospital Stay (HOSPITAL_COMMUNITY)
Admission: EM | Admit: 2022-12-25 | Discharge: 2023-01-26 | DRG: 208 | Disposition: E | Payer: Medicare HMO | Attending: Internal Medicine | Admitting: Internal Medicine

## 2022-12-25 ENCOUNTER — Encounter (HOSPITAL_COMMUNITY): Payer: Self-pay | Admitting: Emergency Medicine

## 2022-12-25 DIAGNOSIS — R579 Shock, unspecified: Secondary | ICD-10-CM | POA: Diagnosis not present

## 2022-12-25 DIAGNOSIS — Z992 Dependence on renal dialysis: Secondary | ICD-10-CM

## 2022-12-25 DIAGNOSIS — N186 End stage renal disease: Secondary | ICD-10-CM | POA: Diagnosis present

## 2022-12-25 DIAGNOSIS — G8194 Hemiplegia, unspecified affecting left nondominant side: Secondary | ICD-10-CM | POA: Diagnosis present

## 2022-12-25 DIAGNOSIS — I342 Nonrheumatic mitral (valve) stenosis: Secondary | ICD-10-CM | POA: Diagnosis present

## 2022-12-25 DIAGNOSIS — I132 Hypertensive heart and chronic kidney disease with heart failure and with stage 5 chronic kidney disease, or end stage renal disease: Secondary | ICD-10-CM | POA: Diagnosis present

## 2022-12-25 DIAGNOSIS — R4182 Altered mental status, unspecified: Secondary | ICD-10-CM | POA: Diagnosis not present

## 2022-12-25 DIAGNOSIS — I25110 Atherosclerotic heart disease of native coronary artery with unstable angina pectoris: Secondary | ICD-10-CM | POA: Diagnosis present

## 2022-12-25 DIAGNOSIS — N2581 Secondary hyperparathyroidism of renal origin: Secondary | ICD-10-CM | POA: Diagnosis present

## 2022-12-25 DIAGNOSIS — E1151 Type 2 diabetes mellitus with diabetic peripheral angiopathy without gangrene: Secondary | ICD-10-CM | POA: Diagnosis present

## 2022-12-25 DIAGNOSIS — I2489 Other forms of acute ischemic heart disease: Secondary | ICD-10-CM | POA: Diagnosis present

## 2022-12-25 DIAGNOSIS — D631 Anemia in chronic kidney disease: Secondary | ICD-10-CM | POA: Diagnosis present

## 2022-12-25 DIAGNOSIS — I2510 Atherosclerotic heart disease of native coronary artery without angina pectoris: Secondary | ICD-10-CM | POA: Diagnosis present

## 2022-12-25 DIAGNOSIS — Z66 Do not resuscitate: Secondary | ICD-10-CM | POA: Diagnosis not present

## 2022-12-25 DIAGNOSIS — G9340 Encephalopathy, unspecified: Secondary | ICD-10-CM | POA: Diagnosis present

## 2022-12-25 DIAGNOSIS — I050 Rheumatic mitral stenosis: Secondary | ICD-10-CM

## 2022-12-25 DIAGNOSIS — Z89431 Acquired absence of right foot: Secondary | ICD-10-CM

## 2022-12-25 DIAGNOSIS — R400 Somnolence: Secondary | ICD-10-CM | POA: Diagnosis not present

## 2022-12-25 DIAGNOSIS — I5A Non-ischemic myocardial injury (non-traumatic): Secondary | ICD-10-CM

## 2022-12-25 DIAGNOSIS — R410 Disorientation, unspecified: Secondary | ICD-10-CM | POA: Diagnosis not present

## 2022-12-25 DIAGNOSIS — R079 Chest pain, unspecified: Secondary | ICD-10-CM | POA: Diagnosis not present

## 2022-12-25 DIAGNOSIS — Z6830 Body mass index (BMI) 30.0-30.9, adult: Secondary | ICD-10-CM

## 2022-12-25 DIAGNOSIS — J449 Chronic obstructive pulmonary disease, unspecified: Secondary | ICD-10-CM | POA: Diagnosis present

## 2022-12-25 DIAGNOSIS — R110 Nausea: Secondary | ICD-10-CM | POA: Diagnosis not present

## 2022-12-25 DIAGNOSIS — I5031 Acute diastolic (congestive) heart failure: Secondary | ICD-10-CM | POA: Diagnosis present

## 2022-12-25 DIAGNOSIS — I2699 Other pulmonary embolism without acute cor pulmonale: Secondary | ICD-10-CM | POA: Diagnosis not present

## 2022-12-25 DIAGNOSIS — G8191 Hemiplegia, unspecified affecting right dominant side: Secondary | ICD-10-CM | POA: Diagnosis present

## 2022-12-25 DIAGNOSIS — F17210 Nicotine dependence, cigarettes, uncomplicated: Secondary | ICD-10-CM | POA: Diagnosis present

## 2022-12-25 DIAGNOSIS — J9601 Acute respiratory failure with hypoxia: Secondary | ICD-10-CM | POA: Diagnosis present

## 2022-12-25 DIAGNOSIS — E785 Hyperlipidemia, unspecified: Secondary | ICD-10-CM | POA: Diagnosis present

## 2022-12-25 DIAGNOSIS — R29810 Facial weakness: Secondary | ICD-10-CM | POA: Diagnosis present

## 2022-12-25 DIAGNOSIS — I272 Pulmonary hypertension, unspecified: Secondary | ICD-10-CM | POA: Diagnosis present

## 2022-12-25 DIAGNOSIS — I2694 Multiple subsegmental pulmonary emboli without acute cor pulmonale: Secondary | ICD-10-CM | POA: Diagnosis not present

## 2022-12-25 DIAGNOSIS — E872 Acidosis, unspecified: Secondary | ICD-10-CM | POA: Diagnosis not present

## 2022-12-25 DIAGNOSIS — I3139 Other pericardial effusion (noninflammatory): Secondary | ICD-10-CM | POA: Diagnosis present

## 2022-12-25 DIAGNOSIS — J90 Pleural effusion, not elsewhere classified: Secondary | ICD-10-CM | POA: Diagnosis present

## 2022-12-25 DIAGNOSIS — I63512 Cerebral infarction due to unspecified occlusion or stenosis of left middle cerebral artery: Secondary | ICD-10-CM | POA: Diagnosis not present

## 2022-12-25 DIAGNOSIS — R471 Dysarthria and anarthria: Secondary | ICD-10-CM | POA: Diagnosis present

## 2022-12-25 DIAGNOSIS — Z90710 Acquired absence of both cervix and uterus: Secondary | ICD-10-CM

## 2022-12-25 DIAGNOSIS — R5381 Other malaise: Secondary | ICD-10-CM | POA: Diagnosis present

## 2022-12-25 DIAGNOSIS — Z79891 Long term (current) use of opiate analgesic: Secondary | ICD-10-CM

## 2022-12-25 DIAGNOSIS — Z89432 Acquired absence of left foot: Secondary | ICD-10-CM

## 2022-12-25 DIAGNOSIS — Z1152 Encounter for screening for COVID-19: Secondary | ICD-10-CM

## 2022-12-25 DIAGNOSIS — E875 Hyperkalemia: Secondary | ICD-10-CM | POA: Diagnosis present

## 2022-12-25 DIAGNOSIS — H548 Legal blindness, as defined in USA: Secondary | ICD-10-CM | POA: Diagnosis present

## 2022-12-25 DIAGNOSIS — R4701 Aphasia: Secondary | ICD-10-CM | POA: Diagnosis present

## 2022-12-25 DIAGNOSIS — E1122 Type 2 diabetes mellitus with diabetic chronic kidney disease: Secondary | ICD-10-CM | POA: Diagnosis present

## 2022-12-25 DIAGNOSIS — Z79899 Other long term (current) drug therapy: Secondary | ICD-10-CM

## 2022-12-25 DIAGNOSIS — R414 Neurologic neglect syndrome: Secondary | ICD-10-CM | POA: Diagnosis present

## 2022-12-25 LAB — CBC
HCT: 30.5 % — ABNORMAL LOW (ref 36.0–46.0)
Hemoglobin: 9.5 g/dL — ABNORMAL LOW (ref 12.0–15.0)
MCH: 26.8 pg (ref 26.0–34.0)
MCHC: 31.1 g/dL (ref 30.0–36.0)
MCV: 86.2 fL (ref 80.0–100.0)
Platelets: 121 10*3/uL — ABNORMAL LOW (ref 150–400)
RBC: 3.54 MIL/uL — ABNORMAL LOW (ref 3.87–5.11)
RDW: 18.9 % — ABNORMAL HIGH (ref 11.5–15.5)
WBC: 11.3 10*3/uL — ABNORMAL HIGH (ref 4.0–10.5)
nRBC: 0 % (ref 0.0–0.2)

## 2022-12-25 LAB — COMPREHENSIVE METABOLIC PANEL
ALT: 13 U/L (ref 0–44)
AST: 29 U/L (ref 15–41)
Albumin: 3.5 g/dL (ref 3.5–5.0)
Alkaline Phosphatase: 59 U/L (ref 38–126)
Anion gap: 14 (ref 5–15)
BUN: 64 mg/dL — ABNORMAL HIGH (ref 6–20)
CO2: 24 mmol/L (ref 22–32)
Calcium: 9.3 mg/dL (ref 8.9–10.3)
Chloride: 99 mmol/L (ref 98–111)
Creatinine, Ser: 10.59 mg/dL — ABNORMAL HIGH (ref 0.44–1.00)
GFR, Estimated: 4 mL/min — ABNORMAL LOW (ref >60)
Glucose, Bld: 92 mg/dL (ref 70–99)
Potassium: 5.2 mmol/L — ABNORMAL HIGH (ref 3.5–5.1)
Sodium: 137 mmol/L (ref 135–145)
Total Bilirubin: 2.4 mg/dL — ABNORMAL HIGH (ref 0.3–1.2)
Total Protein: 7.4 g/dL (ref 6.5–8.1)

## 2022-12-25 LAB — RESP PANEL BY RT-PCR (RSV, FLU A&B, COVID)  RVPGX2
Influenza A by PCR: NEGATIVE
Influenza B by PCR: NEGATIVE
Resp Syncytial Virus by PCR: NEGATIVE
SARS Coronavirus 2 by RT PCR: NEGATIVE

## 2022-12-25 LAB — TROPONIN I (HIGH SENSITIVITY)
Troponin I (High Sensitivity): 2264 ng/L (ref <18)
Troponin I (High Sensitivity): 2120 ng/L (ref <18)

## 2022-12-25 LAB — HEPARIN LEVEL (UNFRACTIONATED): Heparin Unfractionated: <0.1 IU/mL — ABNORMAL LOW (ref 0.30–0.70)

## 2022-12-25 LAB — HEPATITIS B SURFACE ANTIGEN: Hepatitis B Surface Ag: NONREACTIVE

## 2022-12-25 LAB — BRAIN NATRIURETIC PEPTIDE: B Natriuretic Peptide: >4500 pg/mL — ABNORMAL HIGH (ref 0.0–100.0)

## 2022-12-25 MED ORDER — SODIUM CHLORIDE 0.9% FLUSH
3.0000 mL | Freq: Two times a day (BID) | INTRAVENOUS | Status: DC
  Administered 2022-12-25 – 2022-12-26 (×2): 3 mL via INTRAVENOUS

## 2022-12-25 MED ORDER — ONDANSETRON HCL 4 MG/2ML IJ SOLN: 4.0000 mg | Freq: Four times a day (QID) | INTRAMUSCULAR | Status: DC | PRN

## 2022-12-25 MED ORDER — CARVEDILOL 6.25 MG PO TABS: 6.2500 mg | ORAL_TABLET | Freq: Two times a day (BID) | ORAL | Status: DC

## 2022-12-25 MED ORDER — IOHEXOL 350 MG/ML SOLN
75.0000 mL | Freq: Once | INTRAVENOUS | Status: AC | PRN
  Administered 2022-12-25: 75 mL via INTRAVENOUS

## 2022-12-25 MED ORDER — SODIUM CHLORIDE 0.9% FLUSH: 3.0000 mL | INTRAVENOUS | Status: DC | PRN

## 2022-12-25 MED ORDER — MORPHINE BOLUS VIA INFUSION: 2.0000 mg | Freq: Once | INTRAVENOUS | Status: DC | PRN

## 2022-12-25 MED ORDER — MORPHINE SULFATE (PF) 2 MG/ML IV SOLN: 2.0000 mg | Freq: Once | INTRAVENOUS | Status: DC | PRN

## 2022-12-25 MED ORDER — ACETAMINOPHEN 325 MG PO TABS: 650.0000 mg | ORAL_TABLET | ORAL | Status: DC | PRN

## 2022-12-25 MED ORDER — SODIUM CHLORIDE 0.9 % IV SOLN: 250.0000 mL | INTRAVENOUS | Status: DC | PRN

## 2022-12-25 MED ORDER — CARVEDILOL 12.5 MG PO TABS: 12.5000 mg | ORAL_TABLET | Freq: Two times a day (BID) | ORAL | Status: DC

## 2022-12-25 MED ORDER — CHLORHEXIDINE GLUCONATE CLOTH 2 % EX PADS: 6.0000 | MEDICATED_PAD | Freq: Every day | CUTANEOUS | Status: DC

## 2022-12-25 MED ORDER — NITROGLYCERIN IN D5W 200-5 MCG/ML-% IV SOLN
0.0000 ug/min | INTRAVENOUS | Status: DC
  Administered 2022-12-25: 5 ug/min via INTRAVENOUS
  Administered 2022-12-25: 10 ug/min via INTRAVENOUS
  Filled 2022-12-25: qty 250

## 2022-12-25 MED ORDER — ATORVASTATIN CALCIUM 80 MG PO TABS
80.0000 mg | ORAL_TABLET | Freq: Every day | ORAL | Status: DC
  Administered 2022-12-25: 80 mg via ORAL
  Filled 2022-12-25: qty 2
  Filled 2022-12-25: qty 1

## 2022-12-25 MED ORDER — ASPIRIN 81 MG PO CHEW
324.0000 mg | CHEWABLE_TABLET | Freq: Once | ORAL | Status: AC
  Administered 2022-12-25: 324 mg via ORAL
  Filled 2022-12-25: qty 4

## 2022-12-25 MED ORDER — ONDANSETRON HCL 4 MG/2ML IJ SOLN
4.0000 mg | Freq: Once | INTRAMUSCULAR | Status: AC
  Administered 2022-12-25: 4 mg via INTRAVENOUS
  Filled 2022-12-25: qty 2

## 2022-12-25 MED ORDER — ASPIRIN 81 MG PO TBEC
81.0000 mg | DELAYED_RELEASE_TABLET | Freq: Every day | ORAL | Status: DC
  Filled 2022-12-25: qty 1

## 2022-12-25 MED ORDER — FENTANYL CITRATE PF 50 MCG/ML IJ SOSY
25.0000 ug | PREFILLED_SYRINGE | Freq: Once | INTRAMUSCULAR | Status: AC
  Administered 2022-12-25: 25 ug via INTRAVENOUS
  Filled 2022-12-25: qty 1

## 2022-12-25 MED ORDER — CARVEDILOL 12.5 MG PO TABS
25.0000 mg | ORAL_TABLET | Freq: Two times a day (BID) | ORAL | Status: DC
  Administered 2022-12-25: 25 mg via ORAL
  Filled 2022-12-25: qty 2

## 2022-12-25 MED ORDER — HEPARIN BOLUS VIA INFUSION
4000.0000 [IU] | Freq: Once | INTRAVENOUS | Status: AC
  Administered 2022-12-25: 4000 [IU] via INTRAVENOUS

## 2022-12-25 MED ORDER — HEPARIN (PORCINE) 25000 UT/250ML-% IV SOLN
1100.0000 [IU]/h | INTRAVENOUS | Status: DC
  Administered 2022-12-25: 900 [IU]/h via INTRAVENOUS
  Filled 2022-12-25 (×3): qty 250

## 2022-12-25 NOTE — ED Notes (Signed)
Provider at bedside

## 2022-12-25 NOTE — ED Triage Notes (Signed)
Per EMS pt coming from [facility]. Caregiver states patient has been having chills, nausea and lack of appetite since Friday. Dialysis pt (M/W/F) with last treatment on Friday. Patient began c/o chest pain en route with ems and states oxygen helped.

## 2022-12-25 NOTE — ED Provider Notes (Signed)
  Physical Exam  BP 109/80   Pulse 97   Temp 98.7 F (37.1 C) (Oral)   Resp (!) 21   Ht 5\' 4"  (1.626 m)   Wt 79.8 kg   SpO2 100%   BMI 30.21 kg/m   Physical Exam Vitals and nursing note reviewed.  Constitutional:      Appearance: She is ill-appearing.  HENT:     Head: Normocephalic and atraumatic.  Eyes:     Extraocular Movements: Extraocular movements intact.     Pupils: Pupils are equal, round, and reactive to light.  Cardiovascular:     Rate and Rhythm: Regular rhythm. Tachycardia present.  Pulmonary:     Effort: Pulmonary effort is normal.  Abdominal:     Palpations: Abdomen is soft.  Musculoskeletal:     Cervical back: Normal range of motion and neck supple.     Comments: Bilateral BKA  Neurological:     Mental Status: She is alert.     Procedures  Procedures  ED Course / MDM   Clinical Course as of 11/30/2022 1501  Sun Dec 25, 2022  1124 Dr Harl Bowie from cardiology consulted.  Recommends aspirin and heparin for NSTEMI and transferred to [facility] for further management. [RP]  3254 Dr Francia Greaves accepts patient for ED to ED transfer. [RP]  [id number] with ESRD, HTN, PAD presents to [facility] for [facility]. Patient states for last 3 days she has had chills, nausea and decreased appetite. Underwent dialysis Friday without issue. EMS called out today due to persistence of symptoms. Patient states chest pain radiates to left arm. Has right groin fistula, receives dialysis Monday Wednesday Friday. Sent from [facility] for [facility]. Branch of cards states they'll do [place] Tuesday, wants echo as well.  [CG]    Clinical Course User Index [CG] Azucena Cecil, PA-C [RP] Fransico Meadow, MD   Medical Decision Making Amount and/or Complexity of Data Reviewed Labs: ordered. Radiology: ordered.  Risk OTC drugs. Prescription drug management. Decision regarding hospitalization.   Patient transferred from outside facility.  Please see previous provider note for further details  of the event.  In short, this is a [age] year old female who presents with chest pain and shortness of breath.  The patient was found to have elevated troponin at outside facility and transferred here to [facility] for cardiology consult.  Patient also noted to have bilateral subsegmental pulmonary embolism.  Patient is receive heparin bolus.  Consulted with cardiology, Dr. Harl Bowie, states that this patient will undergo left heart cath on Tuesday along with echocardiogram.  Dr. Harl Bowie requesting that this patient be admitted through hospitalist service.  Dr. Roosevelt Locks, [facility] hospitalist, has agreed to admit the patient for management.  Patient stable at time of admission.       Azucena Cecil, PA-C 12/24/2022 1501    Pattricia Boss, MD 12/17/2022 [phone]

## 2022-12-25 NOTE — ED Provider Notes (Signed)
[facility] EMERGENCY DEPARTMENT Provider Note   CSN: [id number] Arrival date & time: 12/21/2022  3875     History  Chief Complaint  Patient presents with   Chest Pain   HPI Rebekah Burton is a 46 y.o. female with ESRD, hypertension, PAD presenting for chest pain.  Patient resides at [place] [facility] home. States in the last 3 days she has had chills, nausea, and lack of appetite.  Still tolerating food intake.  Was able to undergo her normal scheduled dialysis on Friday without complication.  EMS was called by home staff today due to ongoing persistence of her symptoms over the last few days.  In route patient endorsed chest pain and trouble breathing.  O2 sats were in the mid 90s but patient was placed on oxygen for comfort.  Chest pain located in the center of her chest and radiates to her left arm.  Chest pain is reproducible.  Patient unsure what makes it worse or better.  Also endorsing some nausea but no diarrhea or vomiting.  Has a right groin fistula and receives dialysis Monday, Wednesday and Friday.  Chest Pain      Home Medications Prior to Admission medications   Medication Sig Start Date End Date Taking? Authorizing Provider  acetaminophen (TYLENOL) 325 MG tablet Take 2 tablets (650 mg total) by mouth every 6 (six) hours as needed for mild pain (or Fever >/= 101). 07/19/22   Oswald Hillock, MD  b complex-vitamin c-folic acid (NEPHRO-VITE) 0.8 MG TABS tablet Take 1 tablet by mouth every Monday, Wednesday, and Friday with hemodialysis. 03/17/21   Samella Parr, NP  carvedilol (COREG) 25 MG tablet Take 1 tablet (25 mg total) by mouth 2 (two) times daily. 03/17/21   Samella Parr, NP  cloNIDine (CATAPRES) 0.3 MG tablet Take 1 tablet (0.3 mg total) by mouth 3 (three) times daily. 03/17/21   Samella Parr, NP  hydrALAZINE (APRESOLINE) 10 MG tablet Take 10 mg by mouth in the morning and at bedtime. 10/18/22   [provider]  ondansetron (ZOFRAN) 4 MG tablet Take 4 mg  by mouth every 8 (eight) hours as needed for nausea or vomiting. 07/26/22   [provider]  pantoprazole (PROTONIX) 40 MG tablet Take 1 tablet (40 mg total) by mouth daily. 03/17/21   Samella Parr, NP  senna (SENOKOT) 8.6 MG TABS tablet Take 1 tablet (8.6 mg total) by mouth daily as needed for mild constipation. 10/25/22   Johnson, Clanford L, MD  sevelamer carbonate (RENVELA) 800 MG tablet Take 3 tablets by mouth in the morning, at noon, and at bedtime. 08/26/22   [provider]  simethicone (MYLICON) 80 MG chewable tablet Chew 2 tablets by mouth 2 (two) times daily. 10/13/22   [provider]  traZODone (DESYREL) 150 MG tablet Take 150 mg by mouth at bedtime. 05/01/21   [provider]      Allergies    Patient has no known allergies.    Review of Systems   Review of Systems  Cardiovascular:  Positive for chest pain.    Physical Exam   Vitals:   12/19/2022 1115 12/18/2022 1151  BP: 117/78 (!) 130/100  Pulse: (!) 101 100  Resp: (!) 31 (!) 22  Temp:    SpO2: 92% 100%    CONSTITUTIONAL:  ill-appearing, distressed NEURO:  Alert and oriented x 3, CN 3-12 grossly intact EYES:  eyes equal and reactive ENT/NECK:  Supple, no stridor  CARDIO:  tachycardia,  regular rhythm, appears well-perfused  PULM: Tachypneic, CTAB GI/GU:  non-distended, soft, non tender MSK/SPINE:  No gross deformities, no edema, moves all extremities  SKIN:  no rash, atraumatic   *Additional and/or pertinent findings included in MDM below   ED Results / Procedures / Treatments   Labs (all labs ordered are listed, but only abnormal results are displayed) Labs Reviewed  CBC - Abnormal; Notable for the following components:      Result Value   WBC 11.3 (*)    RBC 3.54 (*)    Hemoglobin 9.5 (*)    HCT 30.5 (*)    RDW 18.9 (*)    Platelets 121 (*)    All other components within normal limits  BRAIN NATRIURETIC PEPTIDE - Abnormal; Notable for the following components:   B  Natriuretic Peptide >4,500.0 (*)    All other components within normal limits  COMPREHENSIVE METABOLIC PANEL - Abnormal; Notable for the following components:   Potassium 5.2 (*)    BUN 64 (*)    Creatinine, Ser 10.59 (*)    Total Bilirubin 2.4 (*)    GFR, Estimated 4 (*)    All other components within normal limits  TROPONIN I (HIGH SENSITIVITY) - Abnormal; Notable for the following components:   Troponin I (High Sensitivity) 2,264 (*)    All other components within normal limits  RESP PANEL BY RT-PCR (RSV, FLU A&B, COVID)  [id number]  TROPONIN I (HIGH SENSITIVITY)    EKG EKG Interpretation  Date/Time:  Sunday December 25 2022 11:28:31 EST Ventricular Rate:  99 PR Interval:  130 QRS Duration: 81 QT Interval:  350 QTC Calculation: 450 R Axis:   -66 Text Interpretation: Sinus rhythm Left anterior fascicular block Low voltage, precordial leads Consider anterior infarct Confirmed by Margaretmary Eddy [phone]) on 11/25/2022 11:30:48 AM  Radiology DG Chest Port 1 View  Result Date: 12/18/2022 CLINICAL DATA:  45 year old female with chest pain, nausea, chills. Dialysis patient. EXAM: PORTABLE CHEST 1 VIEW COMPARISON:  Portable chest 11/07/2022 and earlier. FINDINGS: Portable AP upright view at 1013 hours. Stable somewhat low lung volumes. Stable cardiomegaly and mediastinal contours. Visualized tracheal air column is within normal limits. No pneumothorax, pleural effusion or consolidation. Pulmonary vascular congestion has not significantly changed, no overt edema. Inferior approach vascular catheter projecting over the right atrium is stable. No acute osseous abnormality identified. Negative visible bowel gas. IMPRESSION: No acute cardiopulmonary abnormality. Electronically Signed   By: Genevie Ann M.D.   On: 12/03/2022 10:27    Procedures Procedures    Medications Ordered in ED Medications  heparin bolus via infusion 4,000 Units (4,000 Units Intravenous Bolus from Bag 12/09/2022 1145)     Followed by  heparin ADULT infusion 100 units/mL (25000 units/28mL) (900 Units/hr Intravenous New Bag/Given 12/12/2022 1144)  ondansetron (ZOFRAN) injection 4 mg (4 mg Intravenous Given 12/19/2022 1100)  aspirin chewable tablet 324 mg (324 mg Oral Given 12/16/2022 1131)  fentaNYL (SUBLIMAZE) injection 25 mcg (25 mcg Intravenous Given 11/29/2022 1143)  iohexol (OMNIPAQUE) 350 MG/ML injection 75 mL (75 mLs Intravenous Contrast Given 12/23/2022 1219)    ED Course/ Medical Decision Making/ A&P Clinical Course as of 12/16/2022 1303  Sun Dec 25, 2022  1124 Dr Harl Bowie from cardiology consulted.  Recommends aspirin and heparin for NSTEMI and transferred to [facility] for further management. [RP]  3875 Dr Francia Greaves accepts patient for ED to ED transfer. [RP]    Clinical Course User Index [RP] Fransico Meadow, MD  Medical Decision Making Amount and/or Complexity of Data Reviewed Labs: ordered. Radiology: ordered.  Risk OTC drugs. Prescription drug management. Decision regarding hospitalization.   Initial Impression and Ddx 46 year old female who is ill-appearing but nontoxic presenting for chest pain with associated shortness of breath and nausea.  Physical exam notable for tachycardia, tachypnea.  Differential diagnosis for this complaint includes ACS, PE, pneumonia, volume overload, and electrolyte derangement. Patient PMH that increases complexity of ED encounter:    Interpretation of Diagnostics I independent reviewed and interpreted the labs as followed: Elevated troponin, hyperkalemia, elevated BUN/creatinine, anemia and elevated BNP  - I independently visualized the following imaging with scope of interpretation limited to determining acute life threatening conditions related to emergency care: Chest x-ray, which revealed no acute cardiopulmonary process.  -I independently reviewed and interpreted EKG which revealed sinus rhythm  Patient Reassessment and Ultimate  Disposition/Management Treated nausea with Zofran. Troponin was ~2200.  This precipitated concern for ACS versus possible PE. Consulted Dr. Harl Bowie of cardiology who advised to initiate heparin and give aspirin for probable NSTEMI and plan for immediate transfer to [facility].  Discussed with Dr. Francia Greaves in the [facility] who agreed to accept the patient.  Ordered CT angio for PE study.  Patient management required discussion with the following services or consulting groups:  Cardiology, EM attending at [facility]  Complexity of Problems Addressed Acute complicated illness or Injury  Additional Data Reviewed and Analyzed Further history obtained from: Past medical history and medications listed in the EMR, Prior ED visit notes, Recent discharge summary, and Prior labs/imaging results  Patient Encounter Risk Assessment Consideration of hospitalization and Major procedures         Final Clinical Impression(s) / ED Diagnoses Final diagnoses:  Chest pain, unspecified type    Rx / DC Orders ED Discharge Orders     None         Harriet Pho, PA-C 12/04/2022 1303    Fransico Meadow, MD 12/28/22 1645

## 2022-12-25 NOTE — ED Notes (Signed)
[facility] called and setup transport ED to ED for NSTEMI. Dr. Francia Greaves accepting.

## 2022-12-25 NOTE — H&P (Signed)
History and Physical    Rebekah Burton JFH:[id number] DOB: Jun 10, 1976 DOA: 12/22/2022  PCP: [name], NP (Confirm with patient/family/NH records and if not entered, this has to be entered at [facility] point of entry) Patient coming from: Group home  I have personally briefly reviewed patient's old medical records in [place]  Chief Complaint: SOB, chest pain  HPI: Rebekah Burton is a 46 y.o. female with medical history significant of ESRD on HD MWF, HTN, PAD, presented with new onset of chest pain and shortness of breath.  Started yesterday, gradually getting worse.  Patient had a normal hemodialysis on Friday.  Yesterday morning, patient woke up with sharp-like chest pain, worsening with cough and exertion, partial relief with oxygen at home.  Chest pain has been constant nonradiating 6-7/10 associated with increasing shortness of breath.  Denies any cough no fever chills.  Claims that she had " blood clot of legs before was on blood thinners" but cannot tell me more details.  ED Course: Blood pressure borderline low, borderline hypoxic, CT ABD chest showed bilateral segmental PE, small to moderate right-sided pleural effusion and cardiomegaly with small to moderate pericardial effusion and likely pulmonary hypertension.  Blood work showed trending up troponins 2200> 2100, EKG showed no significant ST changes.  Cardiology consulted.  Heparin drip started in the ED.  Review of Systems: As per HPI otherwise 14 point review of systems negative.   Past Medical History:  Diagnosis Date   Anemia    Blind    Blind    ESRD (end stage renal disease) ([place])    Foot ulceration ([place]) 12/2018   History of transmetatarsal amputation of left foot (HCC)    History of transmetatarsal amputation of right foot (HCC)    Hypertension    PAD (peripheral artery disease) ([place])    Tobacco abuse     Past Surgical History:  Procedure Laterality Date   ABDOMINAL AORTOGRAM W/LOWER EXTREMITY Bilateral  01/17/2019   Procedure: ABDOMINAL AORTOGRAM W/LOWER EXTREMITY;  Surgeon: Waynetta Sandy, MD;  Location: [place] [facility];  Service: Cardiovascular;  Laterality: Bilateral;   ABDOMINAL HYSTERECTOMY     BUBBLE STUDY  07/14/2022   Procedure: BUBBLE STUDY;  Surgeon: Berniece Salines, DO;  Location: [place];  Service: Cardiovascular;;   ENDARTERECTOMY FEMORAL Left 01/18/2019   Procedure: ENDARTERECTOMY FEMORAL with Perfundaplasty;  Surgeon: Rosetta Posner, MD;  Location: [place];  Service: Vascular;  Laterality: Left;   FEMORAL-POPLITEAL BYPASS GRAFT Left 01/18/2019   Procedure: Left  FEMORAL- to  Below the Knee POPLITEAL ARTERY bypass using reversed safenous vein.;  Surgeon: Rosetta Posner, MD;  Location: [place];  Service: Vascular;  Laterality: Left;   INCISION AND DRAINAGE ABSCESS Left 01/15/2021   Procedure: INCISION AND DRAINAGE BREAST ABSCESS;  Surgeon: Dwan Bolt, MD;  Location: [place];  Service: General;  Laterality: Left;   INCISION AND DRAINAGE ABSCESS Left 02/25/2021   Procedure: INCISION AND DRAINAGE BREAST  ABSCESS;  Surgeon: Dwan Bolt, MD;  Location: [place];  Service: General;  Laterality: Left;   INCISION AND DRAINAGE ABSCESS Left 10/26/2021   Procedure: INCISION AND DRAINAGE OF RECURRENT LEFT BREAST ABSCESS;  Surgeon: Donnie Mesa, MD;  Location: [place];  Service: General;  Laterality: Left;   INSERTION OF DIALYSIS CATHETER N/A 07/11/2022   Procedure: INSERTION OF RIGHT FEMORAL TEMPORARY DIALYSIS CATHETER;  Surgeon: Cherre Robins, MD;  Location: [place];  Service: Vascular;  Laterality: N/A;   INSERTION OF DIALYSIS CATHETER Right 07/18/2022  Procedure: INSERTION OF TUNNELED DIALYSIS CATHETER;  Surgeon: Angelia Mould, MD;  [facility];  Service: Vascular;  Laterality: Right;   IR DIALY SHUNT INTRO NEEDLE/INTRACATH INITIAL W/IMG LEFT Left 01/28/2021   IR THROMBECTOMY AV FISTULA W/THROMBOLYSIS/PTA/STENT INC/SHUNT/IMG LT Left 10/23/2020   IR US GUIDE BX ASP/DRAIN   01/28/2021   IR US GUIDE VASC ACCESS LEFT  01/26/2021   REVISON OF ARTERIOVENOUS FISTULA Left 07/11/2022   Procedure: EXCISION OF INFECTED LEFT ARM ARTERIOVENOUS FISTULA;  Surgeon: Cherre Robins, MD;  Location: [place];  Service: Vascular;  Laterality: Left;   TEE WITHOUT CARDIOVERSION N/A 07/14/2022   Procedure: TRANSESOPHAGEAL ECHOCARDIOGRAM (TEE);  Surgeon: Berniece Salines, DO;  Location: [facility];  Service: Cardiovascular;  Laterality: N/A;   toe removal Right    All toes on right foot have been removed.    TRANSMETATARSAL AMPUTATION Left 01/18/2019   Procedure: TRANSMETATARSAL AMPUTATION;  Surgeon: Rosetta Posner, MD;  Location: [place];  Service: Vascular;  Laterality: Left;     reports that she has been smoking cigarettes. She has a 10.00 pack-year smoking history. She has been exposed to tobacco smoke. She has never used smokeless tobacco. She reports that she does not currently use drugs after having used the following drugs: Marijuana. She reports that she does not drink alcohol.  No Known Allergies  Family History  Problem Relation Age of Onset   Peripheral Artery Disease Neg Hx    Kidney failure Neg Hx      Prior to Admission medications   Medication Sig Start Date End Date Taking? Authorizing Provider  acetaminophen (TYLENOL) 325 MG tablet Take 2 tablets (650 mg total) by mouth every 6 (six) hours as needed for mild pain (or Fever >/= 101). 07/19/22   Oswald Hillock, MD  b complex-vitamin c-folic acid (NEPHRO-VITE) 0.8 MG TABS tablet Take 1 tablet by mouth every Monday, Wednesday, and Friday with hemodialysis. 03/17/21   Samella Parr, NP  carvedilol (COREG) 25 MG tablet Take 1 tablet (25 mg total) by mouth 2 (two) times daily. 03/17/21   Samella Parr, NP  cloNIDine (CATAPRES) 0.3 MG tablet Take 1 tablet (0.3 mg total) by mouth 3 (three) times daily. 03/17/21   Samella Parr, NP  hydrALAZINE (APRESOLINE) 10 MG tablet Take 10 mg by mouth in the morning and at bedtime. 10/18/22    [provider]  ondansetron (ZOFRAN) 4 MG tablet Take 4 mg by mouth every 8 (eight) hours as needed for nausea or vomiting. 07/26/22   [provider]  pantoprazole (PROTONIX) 40 MG tablet Take 1 tablet (40 mg total) by mouth daily. 03/17/21   Samella Parr, NP  senna (SENOKOT) 8.6 MG TABS tablet Take 1 tablet (8.6 mg total) by mouth daily as needed for mild constipation. 10/25/22   Johnson, Clanford L, MD  sevelamer carbonate (RENVELA) 800 MG tablet Take 3 tablets by mouth in the morning, at noon, and at bedtime. 08/26/22   [provider]  simethicone (MYLICON) 80 MG chewable tablet Chew 2 tablets by mouth 2 (two) times daily. 10/13/22   [provider]  traZODone (DESYREL) 150 MG tablet Take 150 mg by mouth at bedtime. 05/01/21   [provider]    Physical Exam: Vitals:   12/03/2022 1445 11/25/2022 1500 12/19/2022 1515 12/12/2022 1530  BP: 109/80 102/79 (!) 122/96 118/84  Pulse: 97 97    Resp: (!) 21 16 (!) 21 19  Temp:  98.4 F (36.9 C)  TempSrc:      SpO2: 100% 100%    Weight:      Height:        Constitutional: NAD, calm, comfortable Vitals:   12/24/2022 1445 12/05/2022 1500 12/19/2022 1515 12/08/2022 1530  BP: 109/80 102/79 (!) 122/96 118/84  Pulse: 97 97    Resp: (!) 21 16 (!) 21 19  Temp:  98.4 F (36.9 C)    TempSrc:      SpO2: 100% 100%    Weight:      Height:       Eyes: PERRL, lids and conjunctivae normal ENMT: Mucous membranes are moist. Posterior pharynx clear of any exudate or lesions.Normal dentition.  Neck: normal, supple, no masses, no thyromegaly Respiratory: clear to auscultation bilaterally, no wheezing, fine crackles on bilateral lower fields, increasing breathing effort. No accessory muscle use.  Cardiovascular: Regular rate and rhythm, no murmurs / rubs / gallops. 2+ extremity edema on right shin area, compared to no edema on left side. 2+ pedal pulses. No carotid bruits.  Abdomen: no tenderness, no masses palpated. No  hepatosplenomegaly. Bowel sounds positive.  Musculoskeletal: no clubbing / cyanosis. No joint deformity upper and lower extremities. Good ROM, no contractures. Normal muscle tone.  Skin: no rashes, lesions, ulcers. No induration Neurologic: CN 2-12 grossly intact. Sensation intact, DTR normal. Strength 5/5 in all 4.  Psychiatric: Normal judgment and insight. Alert and oriented x 3. Normal mood.     Labs on Admission: I have personally reviewed following labs and imaging studies  CBC: Recent Labs  Lab 12/22/2022 1009  WBC 11.3*  HGB 9.5*  HCT 30.5*  MCV 86.2  PLT 601*   Basic Metabolic Panel: Recent Labs  Lab 12/13/2022 1009  NA 137  K 5.2*  CL 99  CO2 24  GLUCOSE 92  BUN 64*  CREATININE 10.59*  CALCIUM 9.3   GFR: Estimated Creatinine Clearance: 6.9 mL/min (A) (by C-G formula based on SCr of 10.59 mg/dL (H)). Liver Function Tests: Recent Labs  Lab 12/17/2022 1009  AST 29  ALT 13  ALKPHOS 59  BILITOT 2.4*  PROT 7.4  ALBUMIN 3.5   No results for input(s): "LIPASE", "AMYLASE" in the last 168 hours. No results for input(s): "AMMONIA" in the last 168 hours. Coagulation Profile: No results for input(s): "INR", "PROTIME" in the last 168 hours. Cardiac Enzymes: No results for input(s): "CKTOTAL", "CKMB", "CKMBINDEX", "TROPONINI" in the last 168 hours. BNP (last 3 results) No results for input(s): "PROBNP" in the last 8760 hours. HbA1C: No results for input(s): "HGBA1C" in the last 72 hours. CBG: No results for input(s): "GLUCAP" in the last 168 hours. Lipid Profile: No results for input(s): "CHOL", "HDL", "LDLCALC", "TRIG", "CHOLHDL", "LDLDIRECT" in the last 72 hours. Thyroid Function Tests: No results for input(s): "TSH", "T4TOTAL", "FREET4", "T3FREE", "THYROIDAB" in the last 72 hours. Anemia Panel: No results for input(s): "VITAMINB12", "FOLATE", "FERRITIN", "TIBC", "IRON", "RETICCTPCT" in the last 72 hours. Urine analysis: No results found for: "COLORURINE",  "APPEARANCEUR", "LABSPEC", "PHURINE", "GLUCOSEU", "HGBUR", "BILIRUBINUR", "KETONESUR", "PROTEINUR", "UROBILINOGEN", "NITRITE", "LEUKOCYTESUR"  Radiological Exams on Admission: CT Angio Chest PE W/Cm &/Or Wo Cm  Result Date: 12/05/2022 CLINICAL DATA:  46 year old female with chest pain. Patient with end-stage renal disease on dialysis. EXAM: CT ANGIOGRAPHY CHEST WITH CONTRAST TECHNIQUE: Multidetector CT imaging of the chest was performed using the standard protocol during bolus administration of intravenous contrast. Multiplanar CT image reconstructions and MIPs were obtained to evaluate the vascular anatomy. RADIATION DOSE REDUCTION: This exam was performed according  to the departmental dose-optimization program which includes automated exposure control, adjustment of the mA and/or kV according to patient size and/or use of iterative reconstruction technique. CONTRAST:  75mL OMNIPAQUE IOHEXOL 350 MG/ML SOLN COMPARISON:  10/20/2022 CT and prior studies FINDINGS: Cardiovascular: Satisfactory opacification of the pulmonary arteries to the segmental level. Segmental pulmonary emboli are identified within the RIGHT middle lobe, RIGHT LOWER lobe, lingula and LEFT LOWER lobe (best identified on the thin sections). Prominent main pulmonary artery again noted measuring 4.1 cm and diameter. Cardiomegaly and small to moderate pericardial effusion again noted. Heavy coronary artery and aortic atherosclerotic calcifications are again identified. There is no evidence of thoracic aortic aneurysm. A central venous catheter extending from the abdomen is noted with tip in the RIGHT atrium. Linear calcification along the walls of the UPPER SVC and innominate vein again noted. Extensive superficial venous collaterals within the chest noted. Mediastinum/Nodes: Mildly enlarged bilateral axillary and mediastinal lymph nodes are unchanged. The visualized trachea, soft [place] thyroid are unremarkable. Lungs/Pleura: A small to  moderate RIGHT pleural effusion is noted. Diffuse ground-glass opacities are present and not significantly changed. Mild RIGHT basilar atelectasis is noted. There is no evidence of pneumothorax. Upper Abdomen: No acute abnormality. Musculoskeletal: Diffuse subcutaneous edema is present. No acute bony abnormalities are identified. Review of the MIP images confirms the above findings. IMPRESSION: 1. Bilateral segmental pulmonary emboli. Critical Value/emergent results were called by telephone at the time of interpretation on 12/01/2022 at 1:13 pm to provider Joanette Gula , who verbally acknowledged these results. 2. Small to moderate RIGHT pleural effusion, diffuse ground-glass opacities and diffuse subcutaneous edema. 3. Cardiomegaly, small to moderate pericardial effusion and coronary artery disease. 4. Prominent main pulmonary artery again noted, compatible with pulmonary arterial hypertension. 5.  Aortic Atherosclerosis (ICD10-I70.0). Electronically Signed   By: Margarette Canada M.D.   On: 12/24/2022 13:14   DG Chest Port 1 View  Result Date: 11/26/2022 CLINICAL DATA:  46 year old female with chest pain, nausea, chills. Dialysis patient. EXAM: PORTABLE CHEST 1 VIEW COMPARISON:  Portable chest 11/07/2022 and earlier. FINDINGS: Portable AP upright view at 1013 hours. Stable somewhat low lung volumes. Stable cardiomegaly and mediastinal contours. Visualized tracheal air column is within normal limits. No pneumothorax, pleural effusion or consolidation. Pulmonary vascular congestion has not significantly changed, no overt edema. Inferior approach vascular catheter projecting over the right atrium is stable. No acute osseous abnormality identified. Negative visible bowel gas. IMPRESSION: No acute cardiopulmonary abnormality. Electronically Signed   By: Genevie Ann M.D.   On: 11/27/2022 10:27    EKG: Independently reviewed.  Sinus, low voltage  Assessment/Plan Active Problems:   * No active hospital problems. *   (please populate well all problems here in Problem List. (For example, if patient is on BP meds at home and you resume or decide to hold them, it is a problem that needs to be her. Same for CAD, COPD, HLD and so on)  Acute bilateral segmental PE -Unprovoked -On heparin drip, cardiology consult appreciated -Plan to keep patient on heparin drip, as more cardiology workup is indicated at this point. -Echo and DVT study -Outpatient hematology follow-up to rule out hypercoagulable state.  NSTEMI versus demand ischemia -Patient does have risk factor of PVD equivalent to CAD, but flat pattern troponin elevation arguing favors demanding ischemia. -On ACS medication including aspirin, beta-blocker and high efficacy statin -Echocardiogram -Cardiology plan for cath on Tuesday.  Acute hypoxic respiratory failure  -Secondary to acute HFpEF/severe pulmonary hypertension decompensation -With significant symptoms  signs of fluid overload -Discussed with on-call nephrology, emergency dialysis tonight  Pericardial effusion -Unknown etiology, unknown chronicity -Blood pressure maintains, no pulses paradoxus, echocardiogram ordered to evaluate focal wall motion abnormalities and pericardial effusion.  HTN -Cut down Coreg dosage, hold off Clonidine and Hydralazine -Start PRN Hydralazine  DVT prophylaxis: Heparin drip Code Status: Full code Family Communication: None at bedside Disposition Plan: Patient is sick with NSTEMI and PE, requiring inpatient cardiology workup, expect more than 2 midnight hospital stay. Consults called: Cardiology, nephrology. Admission status: PCU   Lequita Halt MD [facility] Pager [phone]  12/24/2022, 4:25 PM

## 2022-12-25 NOTE — Progress Notes (Addendum)
ANTICOAGULATION CONSULT NOTE - Follow Up Consult  Pharmacy Consult for Heparin Indication: chest pain/ACS >> PE   No Known Allergies  Patient Measurements: Height: 5\' 4"  (162.6 cm) Weight: 79.8 kg (176 lb) IBW/kg (Calculated) : 54.7 HEPARIN DW (KG): 71.8   Vital Signs: Temp: 98.4 F (36.9 C) (12/31 1500) Temp Source: Oral (12/31 1430) BP: 118/84 (12/31 1530) Pulse Rate: 97 (12/31 1500)  Labs: Recent Labs    12/24/2022 1009 12/06/2022 1202 12/10/2022 1835  HGB 9.5*  --   --   HCT 30.5*  --   --   PLT 121*  --   --   HEPARINUNFRC  --   --  <0.10*  CREATININE 10.59*  --   --   TROPONINIHS 2,264* 2,120*  --      Estimated Creatinine Clearance: 6.9 mL/min (A) (by C-G formula based on SCr of 10.59 mg/dL (H)).   Medical History: Past Medical History:  Diagnosis Date   Anemia    Blind    Blind    ESRD (end stage renal disease) ([place])    Foot ulceration ([place]) 12/2018   History of transmetatarsal amputation of left foot (HCC)    History of transmetatarsal amputation of right foot (HCC)    Hypertension    PAD (peripheral artery disease) (HCC)    Tobacco abuse     Medications:  See med rec  Assessment: 46 y.o. female with ESRD (M,W, F), hypertension, PAD presenting for chest pain. Chest pain located in the center of her chest and radiates to her left arm. Not on oral anticoagulants. Pharmacy asked to start heparin. Patient had CTA today showing bilateral PE. Cardiology consulted for NSTEMI, recommending LHC.   Heparin level < 0.10 while on 900u/hr. After discussion with nursing heparin level likely inaccurate. IV site was not infusing when he arrived and unclear how long heparin had not actually been infusing. There was minor bleeding IV site in the patient's hand but that has stopped. Access has been re-established and heparin restarted.  Goal of Therapy:  Heparin level 0.3-0.7 units/ml Monitor platelets by anticoagulation protocol: Yes   Plan:  Will not bolus due to  bleeding in hand.  Continue heparin 900u/hr, anticipate patient will need increase but unclear how much given no accurate levels, not empirically increasing to PE dosing 16u/kg in the setting of minor bleeding.  Recheck heparin level in 8 hours. Monitor H & H daily.   Esmeralda Arthur, PharmD, BCCCP  Clinical Pharmacist 11/26/2022,7:43 PM

## 2022-12-25 NOTE — Progress Notes (Signed)
ANTICOAGULATION CONSULT NOTE - Initial Consult  Pharmacy Consult for Heparin Indication: chest pain/ACS  No Known Allergies  Patient Measurements: Height: 5\' 4"  (162.6 cm) Weight: 79.8 kg (176 lb) IBW/kg (Calculated) : 54.7 HEPARIN DW (KG): 71.8   Vital Signs: Temp: 98 F (36.7 C) (12/31 0959) Temp Source: Oral (12/31 0959) BP: 117/78 (12/31 1115) Pulse Rate: 101 (12/31 1115)  Labs: Recent Labs    12/06/2022 1009  HGB 9.5*  HCT 30.5*  PLT 121*  CREATININE 10.59*  TROPONINIHS 2,264*    Estimated Creatinine Clearance: 6.9 mL/min (A) (by C-G formula based on SCr of 10.59 mg/dL (H)).   Medical History: Past Medical History:  Diagnosis Date   Anemia    Blind    Blind    ESRD (end stage renal disease) ([place])    Foot ulceration ([place]) 12/2018   History of transmetatarsal amputation of left foot (HCC)    History of transmetatarsal amputation of right foot (HCC)    Hypertension    PAD (peripheral artery disease) (HCC)    Tobacco abuse     Medications:  See med rec  Assessment: 46 y.o. female with ESRD (M,W, F), hypertension, PAD presenting for chest pain. Chest pain located in the center of her chest and radiates to her left arm. Not on oral anticoagulants. Pharmacy asked to start heparin  Goal of Therapy:  Heparin level 0.3-0.7 units/ml Monitor platelets by anticoagulation protocol: Yes   Plan:  Give 4000 units bolus x 1 Start heparin infusion at 900 units/hr Check anti-Xa level in ~6-8 hours and daily while on heparin Continue to monitor H&H and platelets  Isac Sarna, BS Pharm D, BCPS Clinical Pharmacist 12/23/2022,11:31 AM

## 2022-12-25 NOTE — Consult Note (Signed)
Renal Service Consult Note [facility]  Rebekah Burton 12/17/2022 Sol Blazing, MD Requesting Physician: Dr. Roosevelt Locks  Reason for Consult: ESRD pt w/ acute bilat PE HPI: The patient is a 46 y.o. year-old w/ PMH as below who presented w/ new onset CP and SOB to ED this am. In ED CT chest showed bilat segmental PE and CXR showed pulm edema. Pt was admitted and started on IV heparin. Trops high 2100, EKG w/o acute changes.  We are asked to see for ESRD.   Pt seen in ED.  Pt is confused/ disoriented but responsive and pleasant. Denies any sig SOB at this time. Can't say where she lives or where she is right now.   ROS - denies CP, no joint pain, no HA, no blurry vision, no rash, no diarrhea, no nausea/ vomiting, no dysuria, no difficulty voiding   Past Medical History  Past Medical History:  Diagnosis Date   Anemia    Blind    Blind    ESRD (end stage renal disease) ([place])    Foot ulceration ([place]) 12/2018   History of transmetatarsal amputation of left foot ([place])    History of transmetatarsal amputation of right foot ([place])    Hypertension    PAD (peripheral artery disease) ([place])    Tobacco abuse    Past Surgical History  Past Surgical History:  Procedure Laterality Date   ABDOMINAL AORTOGRAM W/LOWER EXTREMITY Bilateral 01/17/2019   Procedure: ABDOMINAL AORTOGRAM W/LOWER EXTREMITY;  Surgeon: Waynetta Sandy, MD;  Location: [place] [facility];  Service: Cardiovascular;  Laterality: Bilateral;   ABDOMINAL HYSTERECTOMY     BUBBLE STUDY  07/14/2022   Procedure: BUBBLE STUDY;  Surgeon: Berniece Salines, DO;  Location: [place];  Service: Cardiovascular;;   ENDARTERECTOMY FEMORAL Left 01/18/2019   Procedure: ENDARTERECTOMY FEMORAL with Perfundaplasty;  Surgeon: Rosetta Posner, MD;  Location: [place];  Service: Vascular;  Laterality: Left;   FEMORAL-POPLITEAL BYPASS GRAFT Left 01/18/2019   Procedure: Left  FEMORAL- to  Below the Knee POPLITEAL ARTERY bypass using  reversed safenous vein.;  Surgeon: Rosetta Posner, MD;  Location: [place];  Service: Vascular;  Laterality: Left;   INCISION AND DRAINAGE ABSCESS Left 01/15/2021   Procedure: INCISION AND DRAINAGE BREAST ABSCESS;  Surgeon: Dwan Bolt, MD;  Location: [place];  Service: General;  Laterality: Left;   INCISION AND DRAINAGE ABSCESS Left 02/25/2021   Procedure: INCISION AND DRAINAGE BREAST  ABSCESS;  Surgeon: Dwan Bolt, MD;  Location: [place];  Service: General;  Laterality: Left;   INCISION AND DRAINAGE ABSCESS Left 10/26/2021   Procedure: INCISION AND DRAINAGE OF RECURRENT LEFT BREAST ABSCESS;  Surgeon: Donnie Mesa, MD;  Location: [place];  Service: General;  Laterality: Left;   INSERTION OF DIALYSIS CATHETER N/A 07/11/2022   Procedure: INSERTION OF RIGHT FEMORAL TEMPORARY DIALYSIS CATHETER;  Surgeon: Cherre Robins, MD;  Location: [place];  Service: Vascular;  Laterality: N/A;   INSERTION OF DIALYSIS CATHETER Right 07/18/2022   Procedure: INSERTION OF TUNNELED DIALYSIS CATHETER;  Surgeon: Angelia Mould, MD;  Location: [place];  Service: Vascular;  Laterality: Right;   IR DIALY SHUNT INTRO [place] W/IMG LEFT Left 01/28/2021   IR THROMBECTOMY AV FISTULA W/THROMBOLYSIS/PTA/STENT INC/SHUNT/IMG LT Left 10/23/2020   IR US GUIDE BX ASP/DRAIN  01/28/2021   IR US GUIDE VASC ACCESS LEFT  01/26/2021   REVISON OF ARTERIOVENOUS FISTULA Left 07/11/2022   Procedure: EXCISION OF INFECTED LEFT ARM ARTERIOVENOUS FISTULA;  Surgeon: Stanford Breed,  [name], MD;  Location: [place];  Service: Vascular;  Laterality: Left;   TEE WITHOUT CARDIOVERSION N/A 07/14/2022   Procedure: TRANSESOPHAGEAL ECHOCARDIOGRAM (TEE);  Surgeon: Berniece Salines, DO;  Location: [facility];  Service: Cardiovascular;  Laterality: N/A;   toe removal Right    All toes on right foot have been removed.    TRANSMETATARSAL AMPUTATION Left 01/18/2019   Procedure: TRANSMETATARSAL AMPUTATION;  Surgeon: Rosetta Posner, MD;  Location: [facility];  Service:  Vascular;  Laterality: Left;   Family History  Family History  Problem Relation Age of Onset   Peripheral Artery Disease Neg Hx    Kidney failure Neg Hx    Social History  reports that she has been smoking cigarettes. She has a 10.00 pack-year smoking history. She has been exposed to tobacco smoke. She has never used smokeless tobacco. She reports that she does not currently use drugs after having used the following drugs: Marijuana. She reports that she does not drink alcohol. Allergies No Known Allergies Home medications Prior to Admission medications   Medication Sig Start Date End Date Taking? Authorizing Provider  acetaminophen (TYLENOL) 325 MG tablet Take 2 tablets (650 mg total) by mouth every 6 (six) hours as needed for mild pain (or Fever >/= 101). 07/19/22   Oswald Hillock, MD  b complex-vitamin c-folic acid (NEPHRO-VITE) 0.8 MG TABS tablet Take 1 tablet by mouth every Monday, Wednesday, and Friday with hemodialysis. 03/17/21   Samella Parr, NP  carvedilol (COREG) 25 MG tablet Take 1 tablet (25 mg total) by mouth 2 (two) times daily. 03/17/21   Samella Parr, NP  cloNIDine (CATAPRES) 0.3 MG tablet Take 1 tablet (0.3 mg total) by mouth 3 (three) times daily. 03/17/21   Samella Parr, NP  hydrALAZINE (APRESOLINE) 10 MG tablet Take 10 mg by mouth in the morning and at bedtime. 10/18/22   [provider]  ondansetron (ZOFRAN) 4 MG tablet Take 4 mg by mouth every 8 (eight) hours as needed for nausea or vomiting. 07/26/22   [provider]  pantoprazole (PROTONIX) 40 MG tablet Take 1 tablet (40 mg total) by mouth daily. 03/17/21   Samella Parr, NP  senna (SENOKOT) 8.6 MG TABS tablet Take 1 tablet (8.6 mg total) by mouth daily as needed for mild constipation. 10/25/22   Johnson, Clanford L, MD  sevelamer carbonate (RENVELA) 800 MG tablet Take 3 tablets by mouth in the morning, at noon, and at bedtime. 08/26/22   [provider]  simethicone (MYLICON) 80 MG  chewable tablet Chew 2 tablets by mouth 2 (two) times daily. 10/13/22   [provider]  traZODone (DESYREL) 150 MG tablet Take 150 mg by mouth at bedtime. 05/01/21   [provider]     Vitals:   12/06/2022 1445 12/15/2022 1500 12/23/2022 1515 12/24/2022 1530  BP: 109/80 102/79 (!) 122/96 118/84  Pulse: 97 97    Resp: (!) 21 16 (!) 21 19  Temp:  98.4 F (36.9 C)    TempSrc:      SpO2: 100% 100%    Weight:      Height:       Exam Gen alert, no distress, confused No rash, cyanosis or gangrene Sclera anicteric, throat clear  No jvd or bruits Chest dec'd bilat bases, no wheezing or sig rales RRR no MRG Abd soft ntnd no mass or ascites +bs GU deferred MS no joint effusions or deformity Ext 1+ R > L pretib edema, no wounds  or ulcers Neuro is alert, confused, Ox 1, nonfocal    R fem TDC intact    Home meds include - coreg 25 bid, clonidine 0.3 tid, hydralazine 10 tid, protonix, renvela 3 ac tid, trazodone 150 hs     OP HD: [facility] MWF  - needs updating >> 4h 33min  No heparin  R fem TDC   Assessment/ Plan: Acute bilat PE - IV heparin started, per pmd ^Trops/ NSTEMI - vs demand ischemia. Per pmd.  ESRD - on HD MWF. Last HD Friday, has not missed. Plan for HD today/ tonight or tomorrow am.  HTN - BP's are low normal, will lower coreg to 6.25 bid w/ hold orders for sbp < 110. Need to keep BP up for UF as needed.  Volume - some LE edema and +pulm edema by CXR. Max UF w/ this HD. BP's are low-normal.  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 Transfuse prn.  MBD ckd - CCa in range, will add on phos. Cont renvela at home dose as binder for now.  Debility - pt lives at [facility].       Kelly Splinter  MD 11/28/2022, 4:48 PM Recent Labs  Lab 12/03/2022 1009  HGB 9.5*  ALBUMIN 3.5  CALCIUM 9.3  CREATININE 10.59*  K 5.2*   Inpatient medications:  [START ON Jan 06, 2023] aspirin EC  81 mg Oral Daily   atorvastatin  80 mg Oral Daily   [START ON  Jan 06, 2023] carvedilol  12.5 mg Oral BID WC   [START ON 01/06/23] Chlorhexidine Gluconate Cloth  6 each Topical Q0600   sodium chloride flush  3 mL Intravenous Q12H   sodium chloride flush  3 mL Intravenous Q12H    sodium chloride     sodium chloride     heparin 900 Units/hr (12/03/2022 1144)   nitroGLYCERIN 10 mcg/min (12/03/2022 1628)   sodium chloride, sodium chloride, acetaminophen, morphine injection, ondansetron (ZOFRAN) IV, sodium chloride flush, sodium chloride flush

## 2022-12-25 NOTE — ED Notes (Signed)
Writer noticed BP cuff was on left arm where old fistula used to be and inquired if that was okay with [employer] account executive. [facility] RN informed that it was okay and that it was the only place where an accurate reading could be obtained.

## 2022-12-25 NOTE — ED Notes (Signed)
Patient transported to CT 

## 2022-12-25 NOTE — ED Notes (Addendum)
This RN assumed care of pt. Pt denied SOB and CP at this time and remains on heparin. Pt presents with spontaneous breathing, equal bilaterally, and speaks full sentences without difficulty while on 2lpm [place]. Pt skin tone is appropriate for ethnicity, dry and warm. Pt connected to CCM, pulse ox and BP.

## 2022-12-25 NOTE — ED Notes (Signed)
Pt requires vascular access team to place PIV/ get blood from. This RN and other have attempted to draw blood from pt unsuccessfully. Provider notified.

## 2022-12-25 NOTE — Consult Note (Signed)
Cardiology Consultation   Patient ID: Rebekah Burton MRN: [id number]; DOB: [birth year]  Admit date: 11/29/2022 Date of Consult: 12/20/2022  PCP:  [name], NP   [place] Providers Cardiologist:  None        Patient Profile:   Rebekah Burton is a 46 y.o. female with a hx of 46 y.o. female with medical history significant of anemia, ESRD, vision impairment, hypertension, peripheral artery disease, tobacco use disorder,  who is being seen 12/15/2022 for the evaluation of NSTEMI at the request of [facility].  History of Present Illness:   Rebekah Burton presents from [facility]. She reported chills, nausea, for a few days. She has had lack of appetite. RVP is negative. EMS was called by home staff today due to ongoing persistence of her symptoms over the last few days.  In route patient endorsed chest pain and trouble breathing.  O2 sats were in the mid 90s but patient was placed on oxygen for comfort.  Chest pain located in the center of her chest and radiates to her left arm. Her BP stable.  She has troponin 2,264 ->2,120. EKG shows no ST changes. Her prior 07/08/2022 showing normal LV function, grade II DD, RVSP 58 mmHg c/w moderate PHTN, severely dilated LA, has MAC with MV gradient of 5 mmHg. At the bedside, she is sitting up. Having nausea and reported CP to me. Bp normal with some low Bps. On 2 L O2.   Past Medical History:  Diagnosis Date   Anemia    Blind    Blind    ESRD (end stage renal disease) ([place])    Foot ulceration ([place]) 12/2018   History of transmetatarsal amputation of left foot (HCC)    History of transmetatarsal amputation of right foot (HCC)    Hypertension    PAD (peripheral artery disease) ([place])    Tobacco abuse     Past Surgical History:  Procedure Laterality Date   ABDOMINAL AORTOGRAM W/LOWER EXTREMITY Bilateral 01/17/2019   Procedure: ABDOMINAL AORTOGRAM W/LOWER EXTREMITY;  Surgeon: Waynetta Sandy, MD;   Location: [facility];  Service: Cardiovascular;  Laterality: Bilateral;   ABDOMINAL HYSTERECTOMY     BUBBLE STUDY  07/14/2022   Procedure: BUBBLE STUDY;  Surgeon: Berniece Salines, DO;  Location: [facility];  Service: Cardiovascular;;   ENDARTERECTOMY FEMORAL Left 01/18/2019   Procedure: ENDARTERECTOMY FEMORAL with Perfundaplasty;  Surgeon: Rosetta Posner, MD;  Location: [place];  Service: Vascular;  Laterality: Left;   FEMORAL-POPLITEAL BYPASS GRAFT Left 01/18/2019   Procedure: Left  FEMORAL- to  Below the Knee POPLITEAL ARTERY bypass using reversed safenous vein.;  Surgeon: Rosetta Posner, MD;  Location: [place];  Service: Vascular;  Laterality: Left;   INCISION AND DRAINAGE ABSCESS Left 01/15/2021   Procedure: INCISION AND DRAINAGE BREAST ABSCESS;  Surgeon: Dwan Bolt, MD;  Location: [place];  Service: General;  Laterality: Left;   INCISION AND DRAINAGE ABSCESS Left 02/25/2021   Procedure: INCISION AND DRAINAGE BREAST  ABSCESS;  Surgeon: Dwan Bolt, MD;  Location: [place];  Service: General;  Laterality: Left;   INCISION AND DRAINAGE ABSCESS Left 10/26/2021   Procedure: INCISION AND DRAINAGE OF RECURRENT LEFT BREAST ABSCESS;  Surgeon: Donnie Mesa, MD;  Location: [place];  Service: General;  Laterality: Left;   INSERTION OF DIALYSIS CATHETER N/A 07/11/2022   Procedure: INSERTION OF RIGHT FEMORAL TEMPORARY DIALYSIS CATHETER;  Surgeon: Cherre Robins, MD;  Location: [place];  Service: Vascular;  Laterality: N/A;   INSERTION OF DIALYSIS CATHETER Right 07/18/2022   Procedure: INSERTION OF TUNNELED DIALYSIS CATHETER;  Surgeon: Angelia Mould, MD;  Location: [facility] OR;  Service: Vascular;  Laterality: Right;   IR DIALY SHUNT INTRO NEEDLE/INTRACATH INITIAL W/IMG LEFT Left 01/28/2021   IR THROMBECTOMY AV FISTULA W/THROMBOLYSIS/PTA/STENT INC/SHUNT/IMG LT Left 10/23/2020   IR US GUIDE BX ASP/DRAIN  01/28/2021   IR US GUIDE VASC ACCESS LEFT  01/26/2021   REVISON OF ARTERIOVENOUS FISTULA Left 07/11/2022    Procedure: EXCISION OF INFECTED LEFT ARM ARTERIOVENOUS FISTULA;  Surgeon: Cherre Robins, MD;  Location: [place];  Service: Vascular;  Laterality: Left;   TEE WITHOUT CARDIOVERSION N/A 07/14/2022   Procedure: TRANSESOPHAGEAL ECHOCARDIOGRAM (TEE);  Surgeon: Berniece Salines, DO;  Location: [facility];  Service: Cardiovascular;  Laterality: N/A;   toe removal Right    All toes on right foot have been removed.    TRANSMETATARSAL AMPUTATION Left 01/18/2019   Procedure: TRANSMETATARSAL AMPUTATION;  Surgeon: Rosetta Posner, MD;  Location: [facility];  Service: Vascular;  Laterality: Left;     Home Medications:  Prior to Admission medications   Medication Sig Start Date End Date Taking? Authorizing Provider  acetaminophen (TYLENOL) 325 MG tablet Take 2 tablets (650 mg total) by mouth every 6 (six) hours as needed for mild pain (or Fever >/= 101). 07/19/22   Oswald Hillock, MD  b complex-vitamin c-folic acid (NEPHRO-VITE) 0.8 MG TABS tablet Take 1 tablet by mouth every Monday, Wednesday, and Friday with hemodialysis. 03/17/21   Samella Parr, NP  carvedilol (COREG) 25 MG tablet Take 1 tablet (25 mg total) by mouth 2 (two) times daily. 03/17/21   Samella Parr, NP  cloNIDine (CATAPRES) 0.3 MG tablet Take 1 tablet (0.3 mg total) by mouth 3 (three) times daily. 03/17/21   Samella Parr, NP  hydrALAZINE (APRESOLINE) 10 MG tablet Take 10 mg by mouth in the morning and at bedtime. 10/18/22   [provider]  ondansetron (ZOFRAN) 4 MG tablet Take 4 mg by mouth every 8 (eight) hours as needed for nausea or vomiting. 07/26/22   [provider]  pantoprazole (PROTONIX) 40 MG tablet Take 1 tablet (40 mg total) by mouth daily. 03/17/21   Samella Parr, NP  senna (SENOKOT) 8.6 MG TABS tablet Take 1 tablet (8.6 mg total) by mouth daily as needed for mild constipation. 10/25/22   Johnson, Clanford L, MD  sevelamer carbonate (RENVELA) 800 MG tablet Take 3 tablets by mouth in the morning, at noon, and at  bedtime. 08/26/22   [provider]  simethicone (MYLICON) 80 MG chewable tablet Chew 2 tablets by mouth 2 (two) times daily. 10/13/22   [provider]  traZODone (DESYREL) 150 MG tablet Take 150 mg by mouth at bedtime. 05/01/21   [provider]    Inpatient Medications: Scheduled Meds:  sodium chloride flush  3 mL Intravenous Q12H   Continuous Infusions:  sodium chloride     heparin 900 Units/hr (12/24/2022 1144)   nitroGLYCERIN     PRN Meds: sodium chloride, sodium chloride flush  Allergies:   No Known Allergies  Social History:   Social History   Socioeconomic History   Marital status: Single    Spouse name: Not on file   Number of children: Not on file   Years of education: Not on file   Highest education level: Not on file  Occupational History   Not on file  Tobacco Use  Smoking status: Every Day    Packs/day: 0.50    Years: 20.00    Total pack years: 10.00    Types: Cigarettes    Last attempt to quit: 01/14/2019    Years since quitting: 3.9    Passive exposure: Current   Smokeless tobacco: Never   Tobacco comments:    <1 PPD  Vaping Use   Vaping Use: Never used  Substance and Sexual Activity   Alcohol use: Never   Drug use: Not Currently    Types: Marijuana   Sexual activity: Never  Other Topics Concern   Not on file  Social History Narrative   Not on file   Social Determinants of Health   Financial Resource Strain: Not on file  Food Insecurity: No Food Insecurity (10/24/2022)   Hunger Vital Sign    Worried About Running Out of Food in the Last Year: Never true    Ran Out of Food in the Last Year: Never true  Transportation Needs: No Transportation Needs (10/24/2022)   PRAPARE - Hydrologist (Medical): No    Lack of Transportation (Non-Medical): No  Physical Activity: Not on file  Stress: Not on file  Social Connections: Not on file  Intimate Partner Violence: Not At Risk (10/24/2022)    Humiliation, Afraid, Rape, and Kick questionnaire    Fear of Current or Ex-Partner: No    Emotionally Abused: No    Physically Abused: No    Sexually Abused: No    Family History:    Family History  Problem Relation Age of Onset   Peripheral Artery Disease Neg Hx    Kidney failure Neg Hx      ROS:  Please see the history of present illness.  All other ROS reviewed and negative.     Physical Exam/Data:   Vitals:   12/22/2022 1400 12/15/2022 1413 12/24/2022 1415 12/07/2022 1430  BP: (!) 89/58 (!) 112/91 111/87 107/81  Pulse:      Resp: (!) 26 20 (!) 26 19  Temp:    98.7 F (37.1 C)  TempSrc:    Oral  SpO2: 100% 100%    Weight:      Height:       No intake or output data in the 24 hours ending 12/11/2022 1445    11/30/2022    9:56 AM 12/06/2022    2:39 PM 10/24/2022    3:46 PM  Last 3 Weights  Weight (lbs) 176 lb 176 lb 176 lb 5.9 oz  Weight (kg) 79.833 kg 79.833 kg 80 kg     Body mass index is 30.21 kg/m.  General:  Sitting up, reports CP and nausea HEENT: normal Neck: no JVD Vascular: No carotid bruits; Distal pulses 2+ bilaterally Cardiac:  normal S1, S2; RRR; no murmur  Lungs:  clear to auscultation bilaterally, no wheezing, rhonchi or rales  Abd: soft, nontender, no hepatomegaly  Ext: no edema Musculoskeletal:  R ext trace edema, strength normal and equal Skin: warm and dry  Neuro:  CNs 2-12 intact, no focal abnormalities noted Psych:  Normal affect   EKG:  The EKG was personally reviewed and demonstrates:  NSR, LAFB, low voltage  Telemetry:  Telemetry was personally reviewed and demonstrates:  NSR  Relevant CV Studies: TTE 7/124/2023  1. Left ventricular ejection fraction, by estimation, is 65 to 70%. The  left ventricle has normal function. The left ventricle has no regional wall motion abnormalities. There is severe concentric left ventricular hypertrophy. Left  ventricular diastolic parameters are consistent with Grade II diastolic dysfunction  (pseudonormalization). Elevated left ventricular end-diastolic pressure.   2. Right ventricular systolic function is normal. The right ventricular size is normal. There is moderately elevated pulmonary artery systolic  pressure. The estimated right ventricular systolic pressure is 08.6 mmHg.   3. Left atrial size was severely dilated.   4. Right atrial size was mildly dilated.   5. The mitral valve is degenerative. Heavy calcification of the chordae  tendinae off the anterior and posterior mitral valve leaflets. Severely  decreased mobility of the posterior mitral valve leaflet. Moderate mitral annular calcification. Trivial  mitral valve regurgitation. Moderate mitral valve stenosis. MV peak gradient, 13.2 mmHg. The mean mitral valve gradient is 5.0 mmHg. MVA (VTI)  2.02cm2.  Trivial mitral valve regurgitation. Moderate mitral stenosis. Moderate  mitral annular calcification.   6. The aortic valve is tricuspid. Aortic valve regurgitation is not visualized. Aortic valve sclerosis/calcification is present, without any  evidence of aortic stenosis. Aortic valve area, by VTI measures 2.30 cm.  Aortic valve mean gradient measures  7.5 mmHg. Aortic valve Vmax measures 2.09 m/s.   7. The inferior vena cava is dilated in size with >50% respiratory  variability, suggesting right atrial pressure of 8 mmHg.   Laboratory Data:  High Sensitivity Troponin:   Recent Labs  Lab 12/22/2022 1009 12/24/2022 1202  TROPONINIHS 2,264* 2,120*     Chemistry Recent Labs  Lab 12/12/2022 1009  NA 137  K 5.2*  CL 99  CO2 24  GLUCOSE 92  BUN 64*  CREATININE 10.59*  CALCIUM 9.3  GFRNONAA 4*  ANIONGAP 14    Recent Labs  Lab 11/25/2022 1009  PROT 7.4  ALBUMIN 3.5  AST 29  ALT 13  ALKPHOS 59  BILITOT 2.4*   Lipids No results for input(s): "CHOL", "TRIG", "HDL", "LABVLDL", "LDLCALC", "CHOLHDL" in the last 168 hours.  Hematology Recent Labs  Lab 12/22/2022 1009  WBC 11.3*  RBC 3.54*  HGB 9.5*  HCT  30.5*  MCV 86.2  MCH 26.8  MCHC 31.1  RDW 18.9*  PLT 121*   Thyroid No results for input(s): "TSH", "FREET4" in the last 168 hours.  BNP Recent Labs  Lab 12/19/2022 1009  BNP >4,500.0*    DDimer No results for input(s): "DDIMER" in the last 168 hours.   Radiology/Studies:  CT Angio Chest PE W/Cm &/Or Wo Cm  Result Date: 12/16/2022 CLINICAL DATA:  46 year old female with chest pain. Patient with end-stage renal disease on dialysis. EXAM: CT ANGIOGRAPHY CHEST WITH CONTRAST TECHNIQUE: Multidetector CT imaging of the chest was performed using the standard protocol during bolus administration of intravenous contrast. Multiplanar CT image reconstructions and MIPs were obtained to evaluate the vascular anatomy. RADIATION DOSE REDUCTION: This exam was performed according to the departmental dose-optimization program which includes automated exposure control, adjustment of the mA and/or kV according to patient size and/or use of iterative reconstruction technique. CONTRAST:  28m OMNIPAQUE IOHEXOL 350 MG/ML SOLN COMPARISON:  10/20/2022 CT and prior studies FINDINGS: Cardiovascular: Satisfactory opacification of the pulmonary arteries to the segmental level. Segmental pulmonary emboli are identified within the RIGHT middle lobe, RIGHT LOWER lobe, lingula and LEFT LOWER lobe (best identified on the thin sections). Prominent main pulmonary artery again noted measuring 4.1 cm and diameter. Cardiomegaly and small to moderate pericardial effusion again noted. Heavy coronary artery and aortic atherosclerotic calcifications are again identified. There is no evidence of thoracic aortic aneurysm. A central venous catheter extending from the abdomen is  noted with tip in the RIGHT atrium. Linear calcification along the walls of the UPPER SVC and innominate vein again noted. Extensive superficial venous collaterals within the chest noted. Mediastinum/Nodes: Mildly enlarged bilateral axillary and mediastinal lymph nodes  are unchanged. The visualized trachea, soft [place] thyroid are unremarkable. Lungs/Pleura: A small to moderate RIGHT pleural effusion is noted. Diffuse ground-glass opacities are present and not significantly changed. Mild RIGHT basilar atelectasis is noted. There is no evidence of pneumothorax. Upper Abdomen: No acute abnormality. Musculoskeletal: Diffuse subcutaneous edema is present. No acute bony abnormalities are identified. Review of the MIP images confirms the above findings. IMPRESSION: 1. Bilateral segmental pulmonary emboli. Critical Value/emergent results were called by telephone at the time of interpretation on 12/21/2022 at 1:13 pm to provider Joanette Gula , who verbally acknowledged these results. 2. Small to moderate RIGHT pleural effusion, diffuse ground-glass opacities and diffuse subcutaneous edema. 3. Cardiomegaly, small to moderate pericardial effusion and coronary artery disease. 4. Prominent main pulmonary artery again noted, compatible with pulmonary arterial hypertension. 5.  Aortic Atherosclerosis (ICD10-I70.0). Electronically Signed   By: Margarette Canada M.D.   On: 12/23/2022 13:14   DG Chest Port 1 View  Result Date: 12/14/2022 CLINICAL DATA:  46 year old female with chest pain, nausea, chills. Dialysis patient. EXAM: PORTABLE CHEST 1 VIEW COMPARISON:  Portable chest 11/07/2022 and earlier. FINDINGS: Portable AP upright view at 1013 hours. Stable somewhat low lung volumes. Stable cardiomegaly and mediastinal contours. Visualized tracheal air column is within normal limits. No pneumothorax, pleural effusion or consolidation. Pulmonary vascular congestion has not significantly changed, no overt edema. Inferior approach vascular catheter projecting over the right atrium is stable. No acute osseous abnormality identified. Negative visible bowel gas. IMPRESSION: No acute cardiopulmonary abnormality. Electronically Signed   By: Genevie Ann M.D.   On: 12/18/2022 10:27     Assessment and Plan:    Chest pain : she presents with intractable CP. Her troponin is in the 2,000s. She has risk including ESRD. Her CT scan shows significant CAC. Recommend LHC. Has good R radial pulse. R groin fistula.  - heparin gtt  - continue home coreg 25 mg BID (hold for SBP < 90 mmHg) - atorvastatin 80 mg daily - nitro gtt (if Bps allow) - IV morphine x1 for pain - TTE limited - NPO MN on Tuesday  BL PE:  - TTE to look for strain - heparin gtt  ESRD- IHD MWF  HTN- Bps , soft. Restarting home coreg for now. Leaving room for ntiro gtt. Can hold other home BP meds. Would add clonidine back when feasible    Shared Decision Making/Informed Consent The risks [stroke (1 in 1000), death (1 in 1000), kidney failure [usually temporary] (1 in 500), bleeding (1 in 200), allergic reaction [possibly serious] (1 in 200)], benefits (diagnostic support and management of coronary artery disease) and alternatives of a cardiac catheterization were discussed in detail with Ms. Trindade and she is willing to proceed.    Risk Assessment/Risk Scores:     TIMI Risk Score for Unstable Angina or Non-ST Elevation MI:   The patient's TIMI risk score is 3, which indicates a 13% risk of all cause mortality, new or recurrent myocardial infarction or need for urgent revascularization in the next 14 days.   For questions or updates, please contact [place] Please consult [email or web address] for contact info under    Signed, Janina Mayo, MD  12/17/2022 2:45 PM

## 2022-12-25 NOTE — ED Notes (Signed)
Patient scooted in bed all the way to foot of bed and sitting there. RN had patient stand up and scoot up to top of bed. Patient repeatedly scooting down.

## 2022-12-26 ENCOUNTER — Inpatient Hospital Stay (HOSPITAL_COMMUNITY): Payer: Medicare HMO

## 2022-12-26 DIAGNOSIS — G9340 Encephalopathy, unspecified: Secondary | ICD-10-CM | POA: Diagnosis present

## 2022-12-26 DIAGNOSIS — I5A Non-ischemic myocardial injury (non-traumatic): Secondary | ICD-10-CM

## 2022-12-26 DIAGNOSIS — R400 Somnolence: Secondary | ICD-10-CM

## 2022-12-26 DIAGNOSIS — R410 Disorientation, unspecified: Secondary | ICD-10-CM

## 2022-12-26 DIAGNOSIS — I050 Rheumatic mitral stenosis: Secondary | ICD-10-CM

## 2022-12-26 DIAGNOSIS — R079 Chest pain, unspecified: Secondary | ICD-10-CM | POA: Diagnosis not present

## 2022-12-26 DIAGNOSIS — R4182 Altered mental status, unspecified: Secondary | ICD-10-CM

## 2022-12-26 DIAGNOSIS — I2699 Other pulmonary embolism without acute cor pulmonale: Secondary | ICD-10-CM | POA: Diagnosis not present

## 2022-12-26 DIAGNOSIS — I63512 Cerebral infarction due to unspecified occlusion or stenosis of left middle cerebral artery: Secondary | ICD-10-CM

## 2022-12-26 LAB — TYPE AND SCREEN
ABO/RH(D): O POS
Antibody Screen: NEGATIVE

## 2022-12-26 LAB — I-STAT ARTERIAL BLOOD GAS, ED
Acid-Base Excess: 1 mmol/L (ref 0.0–2.0)
Acid-Base Excess: 0 mmol/L (ref 0.0–2.0)
Bicarbonate: 24.7 mmol/L (ref 20.0–28.0)
Bicarbonate: 24.8 mmol/L (ref 20.0–28.0)
Calcium, Ion: 1.08 mmol/L — ABNORMAL LOW (ref 1.15–1.40)
Calcium, Ion: 1.05 mmol/L — ABNORMAL LOW (ref 1.15–1.40)
HCT: 28 % — ABNORMAL LOW (ref 36.0–46.0)
HCT: 26 % — ABNORMAL LOW (ref 36.0–46.0)
Hemoglobin: 9.5 g/dL — ABNORMAL LOW (ref 12.0–15.0)
Hemoglobin: 8.8 g/dL — ABNORMAL LOW (ref 12.0–15.0)
O2 Saturation: 92 %
O2 Saturation: 100 %
Patient temperature: 97.7
Patient temperature: 98
Potassium: 4.2 mmol/L (ref 3.5–5.1)
Potassium: 4 mmol/L (ref 3.5–5.1)
Sodium: 134 mmol/L — ABNORMAL LOW (ref 135–145)
Sodium: 134 mmol/L — ABNORMAL LOW (ref 135–145)
TCO2: 26 mmol/L (ref 22–32)
TCO2: 26 mmol/L (ref 22–32)
pCO2 arterial: 34.5 mmHg (ref 32–48)
pCO2 arterial: 40.2 mmHg (ref 32–48)
pH, Arterial: 7.46 — ABNORMAL HIGH (ref 7.35–7.45)
pH, Arterial: 7.397 (ref 7.35–7.45)
pO2, Arterial: 59 mmHg — ABNORMAL LOW (ref 83–108)
pO2, Arterial: 210 mmHg — ABNORMAL HIGH (ref 83–108)

## 2022-12-26 LAB — CBC
HCT: 28.7 % — ABNORMAL LOW (ref 36.0–46.0)
HCT: 28.3 % — ABNORMAL LOW (ref 36.0–46.0)
Hemoglobin: 8.9 g/dL — ABNORMAL LOW (ref 12.0–15.0)
Hemoglobin: 8.4 g/dL — ABNORMAL LOW (ref 12.0–15.0)
MCH: 26.6 pg (ref 26.0–34.0)
MCH: 26.2 pg (ref 26.0–34.0)
MCHC: 31 g/dL (ref 30.0–36.0)
MCHC: 29.7 g/dL — ABNORMAL LOW (ref 30.0–36.0)
MCV: 85.7 fL (ref 80.0–100.0)
MCV: 88.2 fL (ref 80.0–100.0)
Platelets: 103 10*3/uL — ABNORMAL LOW (ref 150–400)
Platelets: 86 10*3/uL — ABNORMAL LOW (ref 150–400)
RBC: 3.35 MIL/uL — ABNORMAL LOW (ref 3.87–5.11)
RBC: 3.21 MIL/uL — ABNORMAL LOW (ref 3.87–5.11)
RDW: 19 % — ABNORMAL HIGH (ref 11.5–15.5)
RDW: 18.9 % — ABNORMAL HIGH (ref 11.5–15.5)
WBC: 9.5 10*3/uL (ref 4.0–10.5)
WBC: 8.1 10*3/uL (ref 4.0–10.5)
nRBC: 0 % (ref 0.0–0.2)
nRBC: 0 % (ref 0.0–0.2)

## 2022-12-26 LAB — BASIC METABOLIC PANEL
Anion gap: 15 (ref 5–15)
BUN: 39 mg/dL — ABNORMAL HIGH (ref 6–20)
CO2: 24 mmol/L (ref 22–32)
Calcium: 8.8 mg/dL — ABNORMAL LOW (ref 8.9–10.3)
Chloride: 96 mmol/L — ABNORMAL LOW (ref 98–111)
Creatinine, Ser: 6.97 mg/dL — ABNORMAL HIGH (ref 0.44–1.00)
GFR, Estimated: 7 mL/min — ABNORMAL LOW (ref >60)
Glucose, Bld: 81 mg/dL (ref 70–99)
Potassium: 4 mmol/L (ref 3.5–5.1)
Sodium: 135 mmol/L (ref 135–145)

## 2022-12-26 LAB — POCT I-STAT 7, (LYTES, BLD GAS, ICA,H+H)
Acid-Base Excess: 0 mmol/L (ref 0.0–2.0)
Acid-Base Excess: 0 mmol/L (ref 0.0–2.0)
Bicarbonate: 27.6 mmol/L (ref 20.0–28.0)
Bicarbonate: 25.2 mmol/L (ref 20.0–28.0)
Calcium, Ion: 1.44 mmol/L — ABNORMAL HIGH (ref 1.15–1.40)
Calcium, Ion: 1.08 mmol/L — ABNORMAL LOW (ref 1.15–1.40)
HCT: 28 % — ABNORMAL LOW (ref 36.0–46.0)
HCT: 29 % — ABNORMAL LOW (ref 36.0–46.0)
Hemoglobin: 9.5 g/dL — ABNORMAL LOW (ref 12.0–15.0)
Hemoglobin: 9.9 g/dL — ABNORMAL LOW (ref 12.0–15.0)
O2 Saturation: 100 %
O2 Saturation: 100 %
Patient temperature: 97.9
Potassium: 4.4 mmol/L (ref 3.5–5.1)
Potassium: 4.5 mmol/L (ref 3.5–5.1)
Sodium: 136 mmol/L (ref 135–145)
Sodium: 134 mmol/L — ABNORMAL LOW (ref 135–145)
TCO2: 29 mmol/L (ref 22–32)
TCO2: 27 mmol/L (ref 22–32)
pCO2 arterial: 59.7 mmHg — ABNORMAL HIGH (ref 32–48)
pCO2 arterial: 42.5 mmHg (ref 32–48)
pH, Arterial: 7.273 — ABNORMAL LOW (ref 7.35–7.45)
pH, Arterial: 7.38 (ref 7.35–7.45)
pO2, Arterial: 346 mmHg — ABNORMAL HIGH (ref 83–108)
pO2, Arterial: 278 mmHg — ABNORMAL HIGH (ref 83–108)

## 2022-12-26 LAB — HEPARIN LEVEL (UNFRACTIONATED): Heparin Unfractionated: <0.1 IU/mL — ABNORMAL LOW (ref 0.30–0.70)

## 2022-12-26 LAB — COMPREHENSIVE METABOLIC PANEL
ALT: 18 U/L (ref 0–44)
AST: 49 U/L — ABNORMAL HIGH (ref 15–41)
Albumin: 2.6 g/dL — ABNORMAL LOW (ref 3.5–5.0)
Alkaline Phosphatase: 53 U/L (ref 38–126)
Anion gap: 17 — ABNORMAL HIGH (ref 5–15)
BUN: 41 mg/dL — ABNORMAL HIGH (ref 6–20)
CO2: 24 mmol/L (ref 22–32)
Calcium: 10.5 mg/dL — ABNORMAL HIGH (ref 8.9–10.3)
Chloride: 95 mmol/L — ABNORMAL LOW (ref 98–111)
Creatinine, Ser: 7.34 mg/dL — ABNORMAL HIGH (ref 0.44–1.00)
GFR, Estimated: 7 mL/min — ABNORMAL LOW (ref >60)
Glucose, Bld: 127 mg/dL — ABNORMAL HIGH (ref 70–99)
Potassium: 4.4 mmol/L (ref 3.5–5.1)
Sodium: 136 mmol/L (ref 135–145)
Total Bilirubin: 2.9 mg/dL — ABNORMAL HIGH (ref 0.3–1.2)
Total Protein: 5.7 g/dL — ABNORMAL LOW (ref 6.5–8.1)

## 2022-12-26 LAB — ECHOCARDIOGRAM LIMITED
Height: 64 in
S' Lateral: 2.8 cm
Weight: 2816 oz

## 2022-12-26 LAB — POCT I-STAT, CHEM 8
BUN: 38 mg/dL — ABNORMAL HIGH (ref 6–20)
Calcium, Ion: 1.44 mmol/L — ABNORMAL HIGH (ref 1.15–1.40)
Chloride: 101 mmol/L (ref 98–111)
Creatinine, Ser: 7.4 mg/dL — ABNORMAL HIGH (ref 0.44–1.00)
Glucose, Bld: 120 mg/dL — ABNORMAL HIGH (ref 70–99)
HCT: 27 % — ABNORMAL LOW (ref 36.0–46.0)
Hemoglobin: 9.2 g/dL — ABNORMAL LOW (ref 12.0–15.0)
Potassium: 4.3 mmol/L (ref 3.5–5.1)
Sodium: 136 mmol/L (ref 135–145)
TCO2: 27 mmol/L (ref 22–32)

## 2022-12-26 LAB — GLUCOSE, CAPILLARY
Glucose-Capillary: 96 mg/dL (ref 70–99)
Glucose-Capillary: 105 mg/dL — ABNORMAL HIGH (ref 70–99)

## 2022-12-26 LAB — LACTIC ACID, PLASMA: Lactic Acid, Venous: 3.7 mmol/L (ref 0.5–1.9)

## 2022-12-26 LAB — CBG MONITORING, ED
Glucose-Capillary: 78 mg/dL (ref 70–99)
Glucose-Capillary: 148 mg/dL — ABNORMAL HIGH (ref 70–99)
Glucose-Capillary: 69 mg/dL — ABNORMAL LOW (ref 70–99)
Glucose-Capillary: 105 mg/dL — ABNORMAL HIGH (ref 70–99)

## 2022-12-26 LAB — TROPONIN I (HIGH SENSITIVITY): Troponin I (High Sensitivity): 3001 ng/L (ref <18)

## 2022-12-26 LAB — MRSA NEXT GEN BY PCR, NASAL: MRSA by PCR Next Gen: DETECTED — AB

## 2022-12-26 MED ORDER — SIMETHICONE 80 MG PO CHEW: 160.0000 mg | CHEWABLE_TABLET | Freq: Two times a day (BID) | ORAL | Status: DC

## 2022-12-26 MED ORDER — CHLORHEXIDINE GLUCONATE CLOTH 2 % EX PADS: 6.0000 | MEDICATED_PAD | Freq: Every day | CUTANEOUS | Status: DC

## 2022-12-26 MED ORDER — ONDANSETRON HCL 4 MG PO TABS: 4.0000 mg | ORAL_TABLET | Freq: Three times a day (TID) | ORAL | Status: DC | PRN

## 2022-12-26 MED ORDER — MUPIROCIN 2 % EX OINT
1.0000 | TOPICAL_OINTMENT | Freq: Two times a day (BID) | CUTANEOUS | Status: DC
  Administered 2022-12-26: 1 via NASAL
  Filled 2022-12-26: qty 22

## 2022-12-26 MED ORDER — ALBUMIN HUMAN 25 % IV SOLN
25.0000 g | Freq: Four times a day (QID) | INTRAVENOUS | Status: DC
  Administered 2022-12-26 (×2): 25 g via INTRAVENOUS
  Filled 2022-12-26 (×3): qty 100

## 2022-12-26 MED ORDER — NOREPINEPHRINE 4 MG/250ML-% IV SOLN
INTRAVENOUS | Status: AC
  Filled 2022-12-26: qty 250

## 2022-12-26 MED ORDER — MIDAZOLAM HCL 2 MG/2ML IJ SOLN
INTRAMUSCULAR | Status: AC
  Filled 2022-12-26: qty 2

## 2022-12-26 MED ORDER — DOCUSATE SODIUM 50 MG/5ML PO LIQD
100.0000 mg | Freq: Two times a day (BID) | ORAL | Status: DC
  Administered 2022-12-26: 100 mg
  Filled 2022-12-26: qty 10

## 2022-12-26 MED ORDER — ACETAMINOPHEN 325 MG PO TABS: 650.0000 mg | ORAL_TABLET | Freq: Four times a day (QID) | ORAL | Status: DC | PRN

## 2022-12-26 MED ORDER — DEXTROSE 50 % IV SOLN
INTRAVENOUS | Status: AC
  Administered 2022-12-26: 50 mL via INTRAVENOUS
  Filled 2022-12-26: qty 50

## 2022-12-26 MED ORDER — EPINEPHRINE 1 MG/10ML IJ SOSY
PREFILLED_SYRINGE | INTRAMUSCULAR | Status: AC
  Filled 2022-12-26: qty 10

## 2022-12-26 MED ORDER — ATORVASTATIN CALCIUM 80 MG PO TABS: 80.0000 mg | ORAL_TABLET | Freq: Every day | ORAL | Status: DC

## 2022-12-26 MED ORDER — ORAL CARE MOUTH RINSE
15.0000 mL | OROMUCOSAL | Status: DC
  Administered 2022-12-26 (×2): 15 mL via OROMUCOSAL

## 2022-12-26 MED ORDER — FENTANYL CITRATE PF 50 MCG/ML IJ SOSY
PREFILLED_SYRINGE | INTRAMUSCULAR | Status: AC
  Filled 2022-12-26: qty 2

## 2022-12-26 MED ORDER — EPINEPHRINE HCL 5 MG/250ML IV SOLN IN NS
0.5000 ug/min | INTRAVENOUS | Status: DC
  Administered 2022-12-26: 1 ug/min via INTRAVENOUS
  Filled 2022-12-26: qty 250

## 2022-12-26 MED ORDER — SEVELAMER CARBONATE 800 MG PO TABS
2400.0000 mg | ORAL_TABLET | Freq: Three times a day (TID) | ORAL | Status: DC
  Filled 2022-12-26: qty 3

## 2022-12-26 MED ORDER — EPINEPHRINE HCL 5 MG/250ML IV SOLN IN NS
INTRAVENOUS | Status: AC
  Filled 2022-12-26: qty 250

## 2022-12-26 MED ORDER — POLYETHYLENE GLYCOL 3350 17 G PO PACK
17.0000 g | PACK | Freq: Every day | ORAL | Status: DC
  Administered 2022-12-26: 17 g
  Filled 2022-12-26: qty 1

## 2022-12-26 MED ORDER — FENTANYL 2500MCG IN NS 250ML (10MCG/ML) PREMIX INFUSION
50.0000 ug/h | INTRAVENOUS | Status: DC
  Administered 2022-12-26: 50 ug/h via INTRAVENOUS
  Filled 2022-12-26: qty 250

## 2022-12-26 MED ORDER — SODIUM BICARBONATE 8.4 % IV SOLN: 50.0000 meq | Freq: Once | INTRAVENOUS | Status: AC

## 2022-12-26 MED ORDER — TRAZODONE HCL 50 MG PO TABS: 150.0000 mg | ORAL_TABLET | Freq: Every day | ORAL | Status: DC

## 2022-12-26 MED ORDER — SENNA 8.6 MG PO TABS: 1.0000 | ORAL_TABLET | Freq: Every day | ORAL | Status: DC | PRN

## 2022-12-26 MED ORDER — NOREPINEPHRINE 16 MG/250ML-% IV SOLN
0.0000 ug/min | INTRAVENOUS | Status: DC
  Administered 2022-12-26: 25 ug/min via INTRAVENOUS
  Filled 2022-12-26 (×2): qty 250

## 2022-12-26 MED ORDER — FENTANYL CITRATE PF 50 MCG/ML IJ SOSY: 50.0000 ug | PREFILLED_SYRINGE | Freq: Once | INTRAMUSCULAR | Status: DC

## 2022-12-26 MED ORDER — SIMETHICONE 80 MG PO CHEW
160.0000 mg | CHEWABLE_TABLET | Freq: Two times a day (BID) | ORAL | Status: DC
  Administered 2022-12-26: 160 mg via ORAL
  Filled 2022-12-26: qty 2

## 2022-12-26 MED ORDER — SODIUM BICARBONATE 8.4 % IV SOLN
INTRAVENOUS | Status: AC
  Administered 2022-12-26: 50 meq
  Filled 2022-12-26: qty 50

## 2022-12-26 MED ORDER — HYDRALAZINE HCL 10 MG PO TABS: 10.0000 mg | ORAL_TABLET | Freq: Three times a day (TID) | ORAL | Status: DC

## 2022-12-26 MED ORDER — DEXMEDETOMIDINE HCL IN NACL 400 MCG/100ML IV SOLN: 0.0000 ug/kg/h | INTRAVENOUS | Status: DC

## 2022-12-26 MED ORDER — ETOMIDATE 2 MG/ML IV SOLN
INTRAVENOUS | Status: AC
  Filled 2022-12-26: qty 20

## 2022-12-26 MED ORDER — FAMOTIDINE 20 MG PO TABS: 20.0000 mg | ORAL_TABLET | Freq: Two times a day (BID) | ORAL | Status: DC

## 2022-12-26 MED ORDER — FAMOTIDINE 20 MG PO TABS: 10.0000 mg | ORAL_TABLET | Freq: Every day | ORAL | Status: DC

## 2022-12-26 MED ORDER — SODIUM CHLORIDE 0.9 % IV BOLUS (SEPSIS)
1000.0000 mL | Freq: Once | INTRAVENOUS | Status: AC
  Administered 2022-12-26: 1000 mL via INTRAVENOUS

## 2022-12-26 MED ORDER — ETOMIDATE 2 MG/ML IV SOLN
INTRAVENOUS | Status: AC | PRN
  Administered 2022-12-26: 20 mg via INTRAVENOUS

## 2022-12-26 MED ORDER — SODIUM CHLORIDE 0.9% FLUSH: 10.0000 mL | INTRAVENOUS | Status: DC | PRN

## 2022-12-26 MED ORDER — PANTOPRAZOLE SODIUM 40 MG IV SOLR
40.0000 mg | Freq: Two times a day (BID) | INTRAVENOUS | Status: DC
  Administered 2022-12-26: 40 mg via INTRAVENOUS
  Filled 2022-12-26: qty 10

## 2022-12-26 MED ORDER — IOHEXOL 350 MG/ML SOLN
125.0000 mL | Freq: Once | INTRAVENOUS | Status: AC | PRN
  Administered 2022-12-26: 125 mL via INTRAVENOUS

## 2022-12-26 MED ORDER — SODIUM CHLORIDE 0.9% FLUSH
10.0000 mL | Freq: Two times a day (BID) | INTRAVENOUS | Status: DC
  Administered 2022-12-26: 10 mL

## 2022-12-26 MED ORDER — ASPIRIN 81 MG PO CHEW
81.0000 mg | CHEWABLE_TABLET | Freq: Every day | ORAL | Status: DC
  Administered 2022-12-26: 81 mg
  Filled 2022-12-26: qty 1

## 2022-12-26 MED ORDER — PHENYLEPHRINE 80 MCG/ML (10ML) SYRINGE FOR IV PUSH (FOR BLOOD PRESSURE SUPPORT)
PREFILLED_SYRINGE | INTRAVENOUS | Status: AC
  Filled 2022-12-26: qty 10

## 2022-12-26 MED ORDER — DEXTROSE 50 % IV SOLN: 1.0000 | Freq: Once | INTRAVENOUS | Status: AC

## 2022-12-26 MED ORDER — HEPARIN BOLUS VIA INFUSION
1500.0000 [IU] | Freq: Once | INTRAVENOUS | Status: AC
  Administered 2022-12-26: 1500 [IU] via INTRAVENOUS
  Filled 2022-12-26: qty 1500

## 2022-12-26 MED ORDER — ROCURONIUM BROMIDE 10 MG/ML (PF) SYRINGE
PREFILLED_SYRINGE | INTRAVENOUS | Status: AC
  Filled 2022-12-26: qty 10

## 2022-12-26 MED ORDER — NOREPINEPHRINE 4 MG/250ML-% IV SOLN: 0.0000 ug/min | INTRAVENOUS | Status: DC

## 2022-12-26 MED ORDER — ORAL CARE MOUTH RINSE: 15.0000 mL | OROMUCOSAL | Status: DC | PRN

## 2022-12-26 MED ORDER — ROCURONIUM BROMIDE 50 MG/5ML IV SOLN
INTRAVENOUS | Status: AC | PRN
  Administered 2022-12-26: 80 mg via INTRAVENOUS

## 2022-12-26 MED ORDER — FENTANYL BOLUS VIA INFUSION: 50.0000 ug | INTRAVENOUS | Status: DC | PRN

## 2022-12-26 MED ORDER — VASOPRESSIN 20 UNITS/100 ML INFUSION FOR SHOCK
0.0000 [IU]/min | INTRAVENOUS | Status: DC
  Administered 2022-12-26: 0.04 [IU]/min via INTRAVENOUS
  Filled 2022-12-26 (×2): qty 100

## 2022-12-26 MED ORDER — PANTOPRAZOLE SODIUM 40 MG PO TBEC: 40.0000 mg | DELAYED_RELEASE_TABLET | Freq: Every day | ORAL | Status: DC

## 2022-12-26 DEATH — deceased

## 2022-12-27 LAB — HEPATITIS B SURFACE ANTIBODY, QUANTITATIVE: Hep B S AB Quant (Post): 12.6 m[IU]/mL (ref >9.9)

## 2022-12-27 NOTE — Procedures (Signed)
Patient Name: Rebekah Burton  MRN: [id number]  Epilepsy Attending: Lora Havens  Referring Physician/Provider: Lorenza Chick, MD  Duration: 01-12-2023 1337 to 1942  Patient history: 47yo F with acute onset difficulty speaking and right-sided weakness. EEG to evaluate for seizure  Level of alertness:  comatose  AEDs during EEG study: None  Technical aspects: This EEG study was done with scalp electrodes positioned according to the 10-20 International system of electrode placement. Electrical activity was reviewed with band pass filter of 1-70Hz , sensitivity of 7 uV/mm, display speed of 48mm/sec with a 60Hz  notched filter applied as appropriate. EEG data were recorded continuously and digitally stored.  Video monitoring was available and reviewed as appropriate.  Description: EEG showed continuous generalized 3 to 6 Hz theta-delta slowing. Around 1930, eeg gradually showed diffuse background suppression. Patient was terminally extubated and EEG was disconnected. Hyperventilation and photic stimulation were not performed.     ABNORMALITY - Continuous slow, generalized  IMPRESSION: This study was initially suggestive of severe diffuse encephalopathy, nonspecific etiology. Gradually after around 1930, EEG worsened and was suggestive of profound diffuse encephalopathy. Patient was then terminally extubated and EEG was disconnected.No seizures or epileptiform discharges were seen throughout the recording.   Julieann Drummonds Barbra Sarks

## 2023-01-03 ENCOUNTER — Ambulatory Visit (HOSPITAL_COMMUNITY): Admission: RE | Admit: 2023-01-03 | Payer: Medicare HMO | Source: Ambulatory Visit | Admitting: Surgery

## 2023-01-03 SURGERY — UPPER EXTREMITY VENOGRAPHY
Anesthesia: LOCAL

## 2023-01-26 NOTE — Progress Notes (Signed)
LTM EEG discontinued - no skin breakdown at [facility] called and notified that pt was deceased. Went to clean hair up and take machine down

## 2023-01-26 NOTE — Progress Notes (Incomplete)
{  Select Note:[id number]} 

## 2023-01-26 NOTE — Progress Notes (Signed)
Pt transported from ED room 5 to 2H21 without any complications.

## 2023-01-26 NOTE — Progress Notes (Signed)
Time of death: 1945 Pronounced by Briscoe Deutscher, RN and Terie Purser, RN

## 2023-01-26 NOTE — Consult Note (Addendum)
Neurology Consultation  Reason for Consult: Code stroke  Requesting Physician: Ina Homes   CC: aphasia and right sided weakness   History is obtained from: Bedside team and chart review   HPI: Erlean Harkness is a 47 y.o. female with a PMHx of ESRD on iHD, HTN, DM2, BMI 30.21, PAD w/ bilateral toe amputations ambulates with walker at baseline, smoking, legally blind  She was admitted with shortness of breath and chest pain, found to have new pericardial effusion as well as acute bilateral segmental PEs for which she was started on a heparin drip.  She is planned for catheterization on Tuesday with cardiology.  During dialysis today she became acutely aphasic with right-sided weakness for which code stroke was activated  LKW: 6:30 AM Thrombolytic given?: No, heparin drip for PE and pericardial effusion IA performed?: No no LVO    Delay to CTA due to intubation needed for respiratory distress Premorbid modified rankin scale:   Uses a walker at home per ED notes from 1/1 Jacqulyn Bath, per emergency contact who is at her group home manager, the patient needs assistance with bathing and dressing secondary to her blindness but she is continent and able to walk with a walker     4 - Moderately severe disability. Unable to attend to own bodily needs without assistance, and unable to walk unassisted.  ROS: Unable to obtain due to altered mental status.   Past Medical History:  Diagnosis Date   Anemia    Blind    Blind    ESRD (end stage renal disease) ([place])    Foot ulceration ([place]) 12/2018   History of transmetatarsal amputation of left foot (HCC)    History of transmetatarsal amputation of right foot (HCC)    Hypertension    PAD (peripheral artery disease) ([place])    Tobacco abuse    Past Surgical History:  Procedure Laterality Date   ABDOMINAL AORTOGRAM W/LOWER EXTREMITY Bilateral 01/17/2019   Procedure: ABDOMINAL AORTOGRAM W/LOWER EXTREMITY;  Surgeon: Waynetta Sandy, MD;   Location: [facility];  Service: Cardiovascular;  Laterality: Bilateral;   ABDOMINAL HYSTERECTOMY     BUBBLE STUDY  07/14/2022   Procedure: BUBBLE STUDY;  Surgeon: Berniece Salines, DO;  Location: [place];  Service: Cardiovascular;;   ENDARTERECTOMY FEMORAL Left 01/18/2019   Procedure: ENDARTERECTOMY FEMORAL with Perfundaplasty;  Surgeon: Rosetta Posner, MD;  Location: [place];  Service: Vascular;  Laterality: Left;   FEMORAL-POPLITEAL BYPASS GRAFT Left 01/18/2019   Procedure: Left  FEMORAL- to  Below the Knee POPLITEAL ARTERY bypass using reversed safenous vein.;  Surgeon: Rosetta Posner, MD;  Location: [place];  Service: Vascular;  Laterality: Left;   INCISION AND DRAINAGE ABSCESS Left 01/15/2021   Procedure: INCISION AND DRAINAGE BREAST ABSCESS;  Surgeon: Dwan Bolt, MD;  Location: [place];  Service: General;  Laterality: Left;   INCISION AND DRAINAGE ABSCESS Left 02/25/2021   Procedure: INCISION AND DRAINAGE BREAST  ABSCESS;  Surgeon: Dwan Bolt, MD;  Location: [place];  Service: General;  Laterality: Left;   INCISION AND DRAINAGE ABSCESS Left 10/26/2021   Procedure: INCISION AND DRAINAGE OF RECURRENT LEFT BREAST ABSCESS;  Surgeon: Donnie Mesa, MD;  Location: [place];  Service: General;  Laterality: Left;   INSERTION OF DIALYSIS CATHETER N/A 07/11/2022   Procedure: INSERTION OF RIGHT FEMORAL TEMPORARY DIALYSIS CATHETER;  Surgeon: Cherre Robins, MD;  Location: [place];  Service: Vascular;  Laterality: N/A;   INSERTION OF DIALYSIS CATHETER Right 07/18/2022  Procedure: INSERTION OF TUNNELED DIALYSIS CATHETER;  Surgeon: Angelia Mould, MD;  Location: [facility] OR;  Service: Vascular;  Laterality: Right;   IR DIALY SHUNT INTRO NEEDLE/INTRACATH INITIAL W/IMG LEFT Left 01/28/2021   IR THROMBECTOMY AV FISTULA W/THROMBOLYSIS/PTA/STENT INC/SHUNT/IMG LT Left 10/23/2020   IR US GUIDE BX ASP/DRAIN  01/28/2021   IR US GUIDE VASC ACCESS LEFT  01/26/2021   REVISON OF ARTERIOVENOUS FISTULA Left 07/11/2022    Procedure: EXCISION OF INFECTED LEFT ARM ARTERIOVENOUS FISTULA;  Surgeon: Cherre Robins, MD;  Location: [place];  Service: Vascular;  Laterality: Left;   TEE WITHOUT CARDIOVERSION N/A 07/14/2022   Procedure: TRANSESOPHAGEAL ECHOCARDIOGRAM (TEE);  Surgeon: Berniece Salines, DO;  [facility];  Service: Cardiovascular;  Laterality: N/A;   toe removal Right    All toes on right foot have been removed.    TRANSMETATARSAL AMPUTATION Left 01/18/2019   Procedure: TRANSMETATARSAL AMPUTATION;  Surgeon: Rosetta Posner, MD;  Location: [facility] OR;  Service: Vascular;  Laterality: Left;   Current Outpatient Medications  Medication Instructions   acetaminophen (TYLENOL) 650 mg, Oral, Every 6 hours PRN   b complex-vitamin c-folic acid (NEPHRO-VITE) 0.8 MG TABS tablet 1 tablet, Oral, Every M-W-F (Hemodialysis)   carvedilol (COREG) 25 mg, Oral, 2 times daily   cloNIDine (CATAPRES) 0.3 mg, Oral, 3 times daily   hydrALAZINE (APRESOLINE) 10 mg, Oral, 2 times daily   ondansetron (ZOFRAN) 4 mg, Oral, Every 8 hours PRN   pantoprazole (PROTONIX) 40 mg, Oral, Daily   senna (SENOKOT) 8.6 mg, Oral, Daily PRN   sevelamer carbonate (RENVELA) 800 MG tablet 3 tablets, Oral, 3 times daily   simethicone (MYLICON) 80 MG chewable tablet 2 tablets, Oral, 2 times daily   traZODone (DESYREL) 150 mg, Oral, Daily at bedtime    Family History  Problem Relation Age of Onset   Peripheral Artery Disease Neg Hx    Kidney failure Neg Hx     Social History:  reports that she has been smoking cigarettes. She has a 10.00 pack-year smoking history. She has been exposed to tobacco smoke. She has never used smokeless tobacco. She reports that she does not currently use drugs after having used the following drugs: Marijuana. She reports that she does not drink alcohol.   Exam: Current vital signs: BP (!) 100/59   Pulse (!) 101   Temp 98 F (36.7 C) (Oral)   Resp (!) 27   Ht 5' 4"$  (1.626 m)   Wt 79.8 kg   SpO2 94%   BMI 30.21  kg/m  Vital signs in last 24 hours: Temp:  [97.5 F (36.4 C)-98.7 F (37.1 C)] 98 F (36.7 C) 01/15/2023 0730) Pulse Rate:  [47-108] 101 15-Jan-2023 0830) Resp:  [13-31] 27 January 15, 2023 0830) BP: (89-135)/(58-103) 100/59 January 15, 2023 0830) SpO2:  [90 %-100 %] 94 % 01/15/2023 0830) Weight:  [79.8 kg] 79.8 kg (12/31 0956)   Physical Exam  Constitutional: Appears ill Psych: Affect minimally interactive Eyes: No scleral injection HENT: No oropharyngeal obstruction.  MSK: Bilateral toe amputations Cardiovascular: Tachycardic with a regular rate Respiratory: Rapid shallow breathing GI: Soft.  No distension. There is no tenderness.  Skin: Warm dry and intact visible skin  Neuro: Mental Status: Patient is drowsy and minimally verbal.  With repeated stimulation she will curse and generally answers questions "yes" Cranial Nerves: II: Blind at baseline III,IV, VI: Gaze is midline.  She does moves eyes bilaterally V: Facial sensation is symmetric to light eyelash brush VII: Facial movement is notable for right facial  droop on grimace VIII: hearing is intact to voice Remainder unable to assess due to mental status Sensory/motor: Purposeful with the left upper and lower extremity.  Occasionally has some slight spontaneous movement of the right upper extremity, and withdraws the right lower extremity.  Grossly equally reactive to noxious stimulation in all 4 extremities Cerebellar: Unable to assess secondary to patient's mental status  Gait:  Deferred in acute setting   NIHSS total  Score breakdown:  1 point for drowsiness, 2 points for not answering questions, 2 points for not following commands, does not blink to threat bilaterally (3 points but chronic), 1 point for slight right facial droop, 2 points for left arm weakness, 3 points for right arm weakness, 3 points for left leg weakness, 3 points for right leg weakness, 2 points for severe aphasia (says only yes and curses) 1-point for dysarthria Performed  at 9:02 AM    I have reviewed labs in epic and the results pertinent to this consultation are:  Basic Metabolic Panel: Recent Labs  Lab 12/07/2022 1009 Jan 16, 2023 0527 01-16-23 0627  NA 137 135 134*  K 5.2* 4.0 4.2  CL 99 96*  --   CO2 24 24  --   GLUCOSE 92 81  --   BUN 64* 39*  --   CREATININE 10.59* 6.97*  --   CALCIUM 9.3 8.8*  --     CBC: Recent Labs  Lab 11/30/2022 1009 Jan 16, 2023 0527 16-Jan-2023 0627  WBC 11.3* 9.5  --   HGB 9.5* 8.9* 9.5*  HCT 30.5* 28.7* 28.0*  MCV 86.2 85.7  --   PLT 121* 103*  --     Coagulation Studies: No results for input(s): "LABPROT", "INR" in the last 72 hours.    I have reviewed the images obtained:  Head CT personally reviewed, agree with radiology no acute intracranial process  1. No acute intracranial abnormalities. 2. Chronic small vessel ischemic disease and mild age advanced brain atrophy. 3. Stable appearance of mild bilateral posterior parietal encephalomalacia.  CTA head and neck: 1. Negative for large vessel occlusion. 2. Positive for diffuse atherosclerosis in the head and neck. Up to 60% carotid stenosis in the neck, and more advanced Moderate To Severe Bilateral ICA siphon stenosis due to calcified plaque. Moderate tandem stenoses of the Left Vertebral Artery. 3. Superimposed abnormal left internal cerebral vein, associated with hypertrophied left hemisphere veins but with no discrete AVM or evidence of arterial supply. Dural AV fistula is possible, although might also be related to systemic venous abnormality in the setting of End stage renal disease, with similar prominent venous collaterals throughout the scalp, face, and chest wall.   4. Partially visible evidence of anasarca, right pleural effusion.   5.  Aortic Atherosclerosis (ICD10-I70.0).  Assessment: Acute onset difficulty speaking and right-sided weakness.  Differential includes hypoperfusion in the setting of her cardiac comorbidities, stroke, seizure  given her prior left occipital cortical encephalomalacia, less likely delirium in the setting of her comorbidities given the focality of her findings (though this could represent recrudescence/decompensation).  Initially felt not to be a TNK candidate due to being on a heparin drip, however heparin level was not therapeutic, but additionally she has abnormal vasculature concerning for underlying vascular malformation that could be a significant bleeding risk.  Therefore TNK not administered due to bleeding risk, and not a thrombectomy candidate due to no large vessel occlusion  Impression:  -Acute bilateral segmental PE; has been on heparin drip -NSTEMI versus demand ischemia and pericardial  effusion with cardiology following -Acute HFpEF/severe pulmonary hypertension decompensation -Acute hypoxic respiratory failure, intubated in ED prior to CTA due to inability to protect airway -ESRD on IHD with nephrology following  Recommendations: -Stat EEG with MRI compatible leads -Abdominal x-ray to complete screening for MRI brain without contrast -Hold off on heparin if respiratory status allows pending completion of MRI brain, otherwise would change to a low goal no bolus protocol if critical to continue  [place] [phone] Available 7 AM to 7 PM, outside these hours please contact Neurologist on call listed on AMION   CRITICAL CARE Performed by: Lorenza Chick   Total critical care time: 60 minutes  Critical care time was exclusive of separately billable procedures and treating other patients.  Critical care was necessary to treat or prevent imminent or life-threatening deterioration.  Critical care was time spent personally by me on the following activities: development of treatment plan with patient and/or surrogate as well as nursing, discussions with consultants, evaluation of patient's response to treatment, examination of patient, obtaining  history from patient or surrogate, ordering and performing treatments and interventions, ordering and review of laboratory studies, ordering and review of radiographic studies, pulse oximetry and re-evaluation of patient's condition.

## 2023-01-26 NOTE — Procedures (Signed)
Arterial Catheter Insertion Procedure Note  Rebekah Burton  [id number]  23-Oct-1976  Date:2022/12/31  Time:12:02 PM    Provider Performing: Candee Furbish    Procedure: Insertion of Arterial Line [phone]) with US guidance ([id number])   Indication(s) Blood pressure monitoring and/or need for frequent ABGs  Consent Unable to obtain consent due to emergent nature of procedure.  Anesthesia None   Time Out Verified patient identification, verified procedure, site/side was marked, verified correct patient position, special equipment/implants available, medications/allergies/relevant history reviewed, required imaging and test results available.   Sterile Technique Maximal sterile technique including full sterile barrier drape, hand hygiene, sterile gown, sterile gloves, mask, hair covering, sterile ultrasound probe cover (if used).   Procedure Description Area of catheter insertion was cleaned with chlorhexidine and draped in sterile fashion. With real-time ultrasound guidance an arterial catheter was placed into the left femoral artery.  Appropriate arterial tracings confirmed on monitor.     Complications/Tolerance None; patient tolerated the procedure well.   EBL Minimal   Specimen(s) None

## 2023-01-26 NOTE — Consult Note (Signed)
NAME:  Rebekah Burton, MRN:  [id number], DOB:  [birth year], LOS: 1 ADMISSION DATE:  12/05/2022, CONSULTATION DATE:  2023-01-22 REFERRING MD:  TRH, CHIEF COMPLAINT:  Chest pain   History of Present Illness:  47 year old woman with hx of ESRD, severe PVD with amputations who presents with chest pain and SOB.  Workup reveals PE, pericardial effusion, troponin leak.  Had HD today then came back altered.  Code stroke called, PCCM consulted for agonal respirations.   Pertinent  Medical History  ESRD HTN PVD  Significant Hospital Events: Including procedures, antibiotic start and stop dates in addition to other pertinent events   12/31 admit 1/1 PCCM admit, intubaton/line/cardiac arrest  Interim History / Subjective:  Consulted  Objective   Blood pressure (!) 100/59, pulse (!) 101, temperature 98 F (36.7 C), temperature source Oral, resp. rate (!) 27, height 5\' 4"  (1.626 m), weight 79.8 kg, SpO2 94 %.        Intake/Output Summary (Last 24 hours) at 2023-01-22 8127 Last data filed at 01-22-2023 0407 Gross per 24 hour  Intake --  Output 3 ml  Net -3 ml   Filed Weights   12/11/2022 0956  Weight: 79.8 kg    Examination: General: ill appearing woman agonally breathing HENT: R nasolabial flattened, malampatti 4 Lungs: Shallow rapid inspirations, diminished breath sounds bases Cardiovascular: distant, lukewarm extm Abdomen: soft, hypoactive BS Extremities: peripherla muscle wasting Neuro: R neglect, left purposeful, restless Skin: Old LUE AVF wth minimal thrill  Resolved Hospital Problem list   N/A  Assessment & Plan:  Acute encephalopathy r/o stroke, r/o low flow state Acute respiratory distress Pericardial effusion question chronic Small PE Signs of RV overload on CTA Severe chronic PVD  Code stroke called: no LVO, MRI/EEG ordered  Not protecting airway, intubate, vent bundle, PAD protocol  Formal echo  Levophed for MAP 65  Will need central access  Shortly after  arrival to unit, had brief cardiac arrest, US showing LV hypertrophy, moderate pericardial effusion without tamponade, RV overload.  I think the sequence of events was fluid removal>>low flow state to chronic L MCA disease mimicking stroke.  Low flow state from inability to fill LV from hypertrophy and RV overload.  Would not give TNK at this time as I do not think PE is primary driver here more the relative hypovolemia.  Best Practice (right click and "Reselect all SmartList Selections" daily)   Diet/type: NPO DVT prophylaxis: systemic heparin GI prophylaxis: PPI Lines: Central line Foley:  N/A Code Status:  full code Last date of multidisciplinary goals of care discussion [Will call]  Labs   CBC: Recent Labs  Lab 12/11/2022 1009 01-22-23 0527 2023-01-22 0627  WBC 11.3* 9.5  --   HGB 9.5* 8.9* 9.5*  HCT 30.5* 28.7* 28.0*  MCV 86.2 85.7  --   PLT 121* 103*  --     Basic Metabolic Panel: Recent Labs  Lab 12/08/2022 1009 Jan 22, 2023 0527 January 22, 2023 0627  NA 137 135 134*  K 5.2* 4.0 4.2  CL 99 96*  --   CO2 24 24  --   GLUCOSE 92 81  --   BUN 64* 39*  --   CREATININE 10.59* 6.97*  --   CALCIUM 9.3 8.8*  --    GFR: Estimated Creatinine Clearance: 10.5 mL/min (A) (by C-G formula based on SCr of 6.97 mg/dL (H)). Recent Labs  Lab 12/08/2022 1009 Jan 22, 2023 0527  WBC 11.3* 9.5    Liver Function Tests: Recent Labs  Lab 12/03/2022  1009  AST 29  ALT 13  ALKPHOS 59  BILITOT 2.4*  PROT 7.4  ALBUMIN 3.5   No results for input(s): "LIPASE", "AMYLASE" in the last 168 hours. No results for input(s): "AMMONIA" in the last 168 hours.  ABG    Component Value Date/Time   PHART 7.460 (H) 01/06/23 0627   PCO2ART 34.5 06-Jan-2023 0627   PO2ART 59 (L) 2023/01/06 0627   HCO3 24.7 2023/01/06 0627   TCO2 26 January 06, 2023 0627   O2SAT 92 06-Jan-2023 0627     Coagulation Profile: No results for input(s): "INR", "PROTIME" in the last 168 hours.  Cardiac Enzymes: No results for input(s):  "CKTOTAL", "CKMB", "CKMBINDEX", "TROPONINI" in the last 168 hours.  HbA1C: Hgb A1c MFr Bld  Date/Time Value Ref Range Status  01/16/2019 11:22 PM 5.2 4.8 - 5.6 % Final    Comment:    (NOTE) Pre diabetes:          5.7%-6.4% Diabetes:              >6.4% Glycemic control for   <7.0% adults with diabetes     CBG: Recent Labs  Lab 06-Jan-2023 0813 2023/01/06 0853  GLUCAP 78 69*    Review of Systems:   Tachypneic and not answering questions  Past Medical History:  She,  has a past medical history of Anemia, Blind, Blind, ESRD (end stage renal disease) ([place]), Foot ulceration ([place]) (12/2018), History of transmetatarsal amputation of left foot ([place]), History of transmetatarsal amputation of right foot ([place]), Hypertension, PAD (peripheral artery disease) ([place]), and Tobacco abuse.   Surgical History:   Past Surgical History:  Procedure Laterality Date   ABDOMINAL AORTOGRAM W/LOWER EXTREMITY Bilateral 01/17/2019   Procedure: ABDOMINAL AORTOGRAM W/LOWER EXTREMITY;  Surgeon: Waynetta Sandy, MD;  Location: [place] [facility];  Service: Cardiovascular;  Laterality: Bilateral;   ABDOMINAL HYSTERECTOMY     BUBBLE STUDY  07/14/2022   Procedure: BUBBLE STUDY;  Surgeon: Berniece Salines, DO;  Location: [place];  Service: Cardiovascular;;   ENDARTERECTOMY FEMORAL Left 01/18/2019   Procedure: ENDARTERECTOMY FEMORAL with Perfundaplasty;  Surgeon: Rosetta Posner, MD;  Location: [place];  Service: Vascular;  Laterality: Left;   FEMORAL-POPLITEAL BYPASS GRAFT Left 01/18/2019   Procedure: Left  FEMORAL- to  Below the Knee POPLITEAL ARTERY bypass using reversed safenous vein.;  Surgeon: Rosetta Posner, MD;  Location: [place];  Service: Vascular;  Laterality: Left;   INCISION AND DRAINAGE ABSCESS Left 01/15/2021   Procedure: INCISION AND DRAINAGE BREAST ABSCESS;  Surgeon: Dwan Bolt, MD;  Location: [place];  Service: General;  Laterality: Left;   INCISION AND DRAINAGE ABSCESS Left 02/25/2021   Procedure:  INCISION AND DRAINAGE BREAST  ABSCESS;  Surgeon: Dwan Bolt, MD;  Location: [place];  Service: General;  Laterality: Left;   INCISION AND DRAINAGE ABSCESS Left 10/26/2021   Procedure: INCISION AND DRAINAGE OF RECURRENT LEFT BREAST ABSCESS;  Surgeon: Donnie Mesa, MD;  Location: [place];  Service: General;  Laterality: Left;   INSERTION OF DIALYSIS CATHETER N/A 07/11/2022   Procedure: INSERTION OF RIGHT FEMORAL TEMPORARY DIALYSIS CATHETER;  Surgeon: Cherre Robins, MD;  Location: [place];  Service: Vascular;  Laterality: N/A;   INSERTION OF DIALYSIS CATHETER Right 07/18/2022   Procedure: INSERTION OF TUNNELED DIALYSIS CATHETER;  Surgeon: Angelia Mould, MD;  Location: [facility] OR;  Service: Vascular;  Laterality: Right;   IR DIALY SHUNT INTRO NEEDLE/INTRACATH INITIAL W/IMG LEFT Left 01/28/2021   IR THROMBECTOMY AV FISTULA W/THROMBOLYSIS/PTA/STENT INC/SHUNT/IMG  LT Left 10/23/2020   IR US GUIDE BX ASP/DRAIN  01/28/2021   IR US GUIDE VASC ACCESS LEFT  01/26/2021   REVISON OF ARTERIOVENOUS FISTULA Left 07/11/2022   Procedure: EXCISION OF INFECTED LEFT ARM ARTERIOVENOUS FISTULA;  Surgeon: Cherre Robins, MD;  Location: [place];  Service: Vascular;  Laterality: Left;   TEE WITHOUT CARDIOVERSION N/A 07/14/2022   Procedure: TRANSESOPHAGEAL ECHOCARDIOGRAM (TEE);  Surgeon: Berniece Salines, DO;  [facility];  Service: Cardiovascular;  Laterality: N/A;   toe removal Right    All toes on right foot have been removed.    TRANSMETATARSAL AMPUTATION Left 01/18/2019   Procedure: TRANSMETATARSAL AMPUTATION;  Surgeon: Rosetta Posner, MD;  Location: [facility] OR;  Service: Vascular;  Laterality: Left;     Social History:   reports that she has been smoking cigarettes. She has a 10.00 pack-year smoking history. She has been exposed to tobacco smoke. She has never used smokeless tobacco. She reports that she does not currently use drugs after having used the following drugs: Marijuana. She reports that she does not drink  alcohol.   Family History:  Her family history is negative for Peripheral Artery Disease and Kidney failure.   Allergies No Known Allergies   Home Medications  Prior to Admission medications   Medication Sig Start Date End Date Taking? Authorizing Provider  acetaminophen (TYLENOL) 325 MG tablet Take 2 tablets (650 mg total) by mouth every 6 (six) hours as needed for mild pain (or Fever >/= 101). 07/19/22   Oswald Hillock, MD  b complex-vitamin c-folic acid (NEPHRO-VITE) 0.8 MG TABS tablet Take 1 tablet by mouth every Monday, Wednesday, and Friday with hemodialysis. 03/17/21   Samella Parr, NP  carvedilol (COREG) 25 MG tablet Take 1 tablet (25 mg total) by mouth 2 (two) times daily. 03/17/21   Samella Parr, NP  cloNIDine (CATAPRES) 0.3 MG tablet Take 1 tablet (0.3 mg total) by mouth 3 (three) times daily. 03/17/21   Samella Parr, NP  hydrALAZINE (APRESOLINE) 10 MG tablet Take 10 mg by mouth in the morning and at bedtime. 10/18/22   [provider]  ondansetron (ZOFRAN) 4 MG tablet Take 4 mg by mouth every 8 (eight) hours as needed for nausea or vomiting. 07/26/22   [provider]  pantoprazole (PROTONIX) 40 MG tablet Take 1 tablet (40 mg total) by mouth daily. 03/17/21   Samella Parr, NP  senna (SENOKOT) 8.6 MG TABS tablet Take 1 tablet (8.6 mg total) by mouth daily as needed for mild constipation. 10/25/22   Johnson, Clanford L, MD  sevelamer carbonate (RENVELA) 800 MG tablet Take 3 tablets by mouth in the morning, at noon, and at bedtime. 08/26/22   [provider]  simethicone (MYLICON) 80 MG chewable tablet Chew 2 tablets by mouth 2 (two) times daily. 10/13/22   [provider]  traZODone (DESYREL) 150 MG tablet Take 150 mg by mouth at bedtime. 05/01/21   [provider]     Critical care time: 78 minutes independent of procedures

## 2023-01-26 NOTE — Progress Notes (Signed)
Received a call regarding the patient being lethargic.  Presented at bedside.  The patient is minimally following commands which is a change from earlier, per nursing staff.  The patient is on heparin drip for pulmonary embolism.  History of ESRD on HD and history of CVA.  Stat ABG and CT head ordered and are pending.  Time: 15 minutes.

## 2023-01-26 NOTE — Code Documentation (Signed)
Stroke Response Nurse Documentation Code Documentation  Rebekah Burton is a 47 y.o. female arriving to [facility]  via [facility] on 12/24/2022 with past medical hx of ESRD, HRN, Blind, foot amputation, PAD. On heparin IV. Code stroke was activated by ED.   Patient from group home yesterday with chills, nausea and lack of appetite.  Chest pain.  She is currently on a heparin gtt for a PE.  Per staff she had HD overnight.  When she returned RN was concerned regarding lethargy and and following commands.  She was then able to ambulate per RN note.  This was considered LKW [id number].  She is now having difficulty speaking and is not moving her right side.  Stroke team at the bedside on patient arrival. Labs drawn and patient cleared for CT by Dr. Tamala Julian.  Patient was intubated prior to going to radiology for CTA.  . Patient to CT with team. Prior to sedation for intubation NIHSS 23, see documentation for details and code stroke times. Patient with decreased LOC, disoriented, not following commands, bilateral hemianopia, right facial droop, bilateral arm weakness, bilateral leg weakness, Global aphasia , and dysarthria  on exam. The following imaging was completed:  CTA. Patient is not a candidate for IV Thrombolytic due to being on heparin gtt. Patient is not a candidate for IR due to no LVO.   NS bolus started for BP . 74/51- 88/60  MAP 60-70 Plan to Admit to ICU   Bedside handoff with ED RN [place].    Raliegh Ip  Stroke Response RN

## 2023-01-26 NOTE — Progress Notes (Signed)
Chaplain responded to Code Blue; no family is present.  Advised RN of chaplain availability.  Please call if family arrives and/or support is needed.  Minus [place], [place] Pager:  [phone]    January 22, 2023 1200  Clinical Encounter Type  Visited With Patient not available  Visit Type Initial;Code  Referral From Physician  Consult/Referral To Chaplain  Stress Factors  Patient Stress Factors Health changes

## 2023-01-26 NOTE — ED Notes (Signed)
[place], MD at bedside assessing patient.

## 2023-01-26 NOTE — ED Notes (Signed)
Writer spoke with ER provider about patient condition upon returning from HD. Pt was able to follow commands before going to HD and answer simple questions. Now she is unable to follow commands and can only answer yes. She now seems like she's in pain again but cannot tell me where she's in pain. She has JVD on the right side with veins popping out in her right temple. VSS. ER Provider reached out and contacted Hospitalist who is on their way down to evaluate. 2nd IV established, EKG obtained.

## 2023-01-26 NOTE — Progress Notes (Addendum)
Have called ED for report x 2 with no answer. Will await call from them.

## 2023-01-26 NOTE — Consult Note (Addendum)
[facility] Renal Consultation Note    Indication for Consultation:  Management of ESRD/hemodialysis; anemia, hypertension/volume and secondary hyperparathyroidism  NOB:SJGGEZ, Chrystal, NP  HPI: Rebekah Burton is a 47 y.o. female on HD MWF at [facility]. Her past medical history significant for HTN and PAD.   Patient presented to the ED c/o SOB and CP. CT angio showed bilateral segmental PE, small-moderate R pleural effusion, small-moderate pericardial effusion, and pulmonary hypertension. Currently on heparin infusion and Cardiology consulted. Also received hemodialysis earlier this morning and noted net UF 3L. According to HD RN note, patient tolerated treatment well with no acute issues. Per notes, medical team was called to bedside d/t patient becoming acutely altered. Patient was found to have R facial droop, R gaze, and asphasia. For concern for CVA, patient was then intubated for airway protection and code stroke was initiated. Head CT showed no acute intracranial abnormalities and chronic small vessel ischemic disease. Neck CT showed no evidence of large vessel occlusion but showed 60% carotid stenosis, moderate-severe ICA stenosis d/t calcified plaque, and abnormal L internal cerebral vein. MRI brain ordered.  Patient seen at bedside; however, unable to assess d/t critical care team placing in a central line. Plan to resume HD on MWF schedule. Monitor her volume status very closely. Next HD 12/28/22 for now but will dialyze her tomorrow if needed.  Family at bedside-  pt had decompensated since this AM-  now intubated- on pressors, s/p PEA arrest-  doing EEG  Past Medical History:  Diagnosis Date   Anemia    Blind    Blind    ESRD (end stage renal disease) ([place])    Foot ulceration ([place]) 12/2018   History of transmetatarsal amputation of left foot ([place])    History of transmetatarsal amputation of right foot ([place])    Hypertension    PAD (peripheral artery disease) ([place])     Tobacco abuse    Past Surgical History:  Procedure Laterality Date   ABDOMINAL AORTOGRAM W/LOWER EXTREMITY Bilateral 01/17/2019   Procedure: ABDOMINAL AORTOGRAM W/LOWER EXTREMITY;  Surgeon: Waynetta Sandy, MD;  Location: [place] [facility];  Service: Cardiovascular;  Laterality: Bilateral;   ABDOMINAL HYSTERECTOMY     BUBBLE STUDY  07/14/2022   Procedure: BUBBLE STUDY;  Surgeon: Berniece Salines, DO;  Location: [place];  Service: Cardiovascular;;   ENDARTERECTOMY FEMORAL Left 01/18/2019   Procedure: ENDARTERECTOMY FEMORAL with Perfundaplasty;  Surgeon: Rosetta Posner, MD;  Location: [place];  Service: Vascular;  Laterality: Left;   FEMORAL-POPLITEAL BYPASS GRAFT Left 01/18/2019   Procedure: Left  FEMORAL- to  Below the Knee POPLITEAL ARTERY bypass using reversed safenous vein.;  Surgeon: Rosetta Posner, MD;  Location: [place];  Service: Vascular;  Laterality: Left;   INCISION AND DRAINAGE ABSCESS Left 01/15/2021   Procedure: INCISION AND DRAINAGE BREAST ABSCESS;  Surgeon: Dwan Bolt, MD;  Location: [place];  Service: General;  Laterality: Left;   INCISION AND DRAINAGE ABSCESS Left 02/25/2021   Procedure: INCISION AND DRAINAGE BREAST  ABSCESS;  Surgeon: Dwan Bolt, MD;  Location: [place];  Service: General;  Laterality: Left;   INCISION AND DRAINAGE ABSCESS Left 10/26/2021   Procedure: INCISION AND DRAINAGE OF RECURRENT LEFT BREAST ABSCESS;  Surgeon: Donnie Mesa, MD;  Location: [place];  Service: General;  Laterality: Left;   INSERTION OF DIALYSIS CATHETER N/A 07/11/2022   Procedure: INSERTION OF RIGHT FEMORAL TEMPORARY DIALYSIS CATHETER;  Surgeon: Cherre Robins, MD;  Location: [place];  Service: Vascular;  Laterality: N/A;   INSERTION OF DIALYSIS CATHETER Right 07/18/2022   Procedure: INSERTION OF TUNNELED DIALYSIS CATHETER;  Surgeon: Angelia Mould, MD;  Location: [facility] OR;  Service: Vascular;  Laterality: Right;   IR DIALY SHUNT INTRO NEEDLE/INTRACATH INITIAL W/IMG LEFT Left  01/28/2021   IR THROMBECTOMY AV FISTULA W/THROMBOLYSIS/PTA/STENT INC/SHUNT/IMG LT Left 10/23/2020   IR US GUIDE BX ASP/DRAIN  01/28/2021   IR US GUIDE VASC ACCESS LEFT  01/26/2021   REVISON OF ARTERIOVENOUS FISTULA Left 07/11/2022   Procedure: EXCISION OF INFECTED LEFT ARM ARTERIOVENOUS FISTULA;  Surgeon: Cherre Robins, MD;  Location: [place];  Service: Vascular;  Laterality: Left;   TEE WITHOUT CARDIOVERSION N/A 07/14/2022   Procedure: TRANSESOPHAGEAL ECHOCARDIOGRAM (TEE);  Surgeon: Berniece Salines, DO;  Location: [facility];  Service: Cardiovascular;  Laterality: N/A;   toe removal Right    All toes on right foot have been removed.    TRANSMETATARSAL AMPUTATION Left 01/18/2019   Procedure: TRANSMETATARSAL AMPUTATION;  Surgeon: Rosetta Posner, MD;  Location: [facility] OR;  Service: Vascular;  Laterality: Left;   Family History  Problem Relation Age of Onset   Peripheral Artery Disease Neg Hx    Kidney failure Neg Hx    Social History:  reports that she has been smoking cigarettes. She has a 10.00 pack-year smoking history. She has been exposed to tobacco smoke. She has never used smokeless tobacco. She reports that she does not currently use drugs after having used the following drugs: Marijuana. She reports that she does not drink alcohol. No Known Allergies Prior to Admission medications   Medication Sig Start Date End Date Taking? Authorizing Provider  acetaminophen (TYLENOL) 325 MG tablet Take 2 tablets (650 mg total) by mouth every 6 (six) hours as needed for mild pain (or Fever >/= 101). 07/19/22   Oswald Hillock, MD  b complex-vitamin c-folic acid (NEPHRO-VITE) 0.8 MG TABS tablet Take 1 tablet by mouth every Monday, Wednesday, and Friday with hemodialysis. 03/17/21   Samella Parr, NP  carvedilol (COREG) 25 MG tablet Take 1 tablet (25 mg total) by mouth 2 (two) times daily. 03/17/21   Samella Parr, NP  cloNIDine (CATAPRES) 0.3 MG tablet Take 1 tablet (0.3 mg total) by mouth 3 (three) times daily.  03/17/21   Samella Parr, NP  hydrALAZINE (APRESOLINE) 10 MG tablet Take 10 mg by mouth in the morning and at bedtime. 10/18/22   [provider]  ondansetron (ZOFRAN) 4 MG tablet Take 4 mg by mouth every 8 (eight) hours as needed for nausea or vomiting. 07/26/22   [provider]  pantoprazole (PROTONIX) 40 MG tablet Take 1 tablet (40 mg total) by mouth daily. 03/17/21   Samella Parr, NP  senna (SENOKOT) 8.6 MG TABS tablet Take 1 tablet (8.6 mg total) by mouth daily as needed for mild constipation. 10/25/22   Johnson, Clanford L, MD  sevelamer carbonate (RENVELA) 800 MG tablet Take 3 tablets by mouth in the morning, at noon, and at bedtime. 08/26/22   [provider]  simethicone (MYLICON) 80 MG chewable tablet Chew 2 tablets by mouth 2 (two) times daily. 10/13/22   [provider]  traZODone (DESYREL) 150 MG tablet Take 150 mg by mouth at bedtime. 05/01/21   [provider]   Current Facility-Administered Medications  Medication Dose Route Frequency Provider Last Rate Last Admin   0.9 %  sodium chloride infusion  250 mL Intravenous PRN Lequita Halt, MD  acetaminophen (TYLENOL) tablet 650 mg  650 mg Oral Q6H PRN Lequita Halt, MD       acetaminophen (TYLENOL) tablet 650 mg  650 mg Oral Q4H PRN Lequita Halt, MD       aspirin EC tablet 81 mg  81 mg Oral Daily Wynetta Fines T, MD       atorvastatin (LIPITOR) tablet 80 mg  80 mg Oral Daily Janina Mayo, MD   80 mg at 11/28/2022 1529   carvedilol (COREG) tablet 6.25 mg  6.25 mg Oral BID WC Roney Jaffe, MD       Chlorhexidine Gluconate Cloth 2 % PADS 6 each  6 each Topical Q0600 Roney Jaffe, MD       dexmedetomidine (PRECEDEX) 400 MCG/100ML (4 mcg/mL) infusion  0-1.2 mcg/kg/hr Intravenous Continuous Candee Furbish, MD       docusate (COLACE) 50 MG/5ML liquid 100 mg  100 mg Per Tube BID Candee Furbish, MD       famotidine (PEPCID) tablet 10 mg  10 mg Per Tube Daily Candee Furbish, MD        fentaNYL (SUBLIMAZE) bolus via infusion 50-100 mcg  50-100 mcg Intravenous Q15 min PRN Candee Furbish, MD       fentaNYL (SUBLIMAZE) injection 50 mcg  50 mcg Intravenous Once Candee Furbish, MD       fentaNYL 2550mcg in NS 224mL (25mcg/ml) infusion-PREMIX  50-200 mcg/hr Intravenous Continuous Candee Furbish, MD       heparin ADULT infusion 100 units/mL (25000 units/272mL)  1,100 Units/hr Intravenous Continuous Candee Furbish, MD 9 mL/hr at 12/21/2022 1950 900 Units/hr at 11/27/2022 1950   heparin bolus via infusion 1,500 Units  1,500 Units Intravenous Once Candee Furbish, MD       hydrALAZINE (APRESOLINE) tablet 10 mg  10 mg Oral Q8H Lequita Halt, MD       ondansetron [facility]) injection 4 mg  4 mg Intravenous Q6H PRN Janina Mayo, MD       ondansetron [facility]) tablet 4 mg  4 mg Oral Q8H PRN Lequita Halt, MD       pantoprazole (PROTONIX) EC tablet 40 mg  40 mg Oral Daily Wynetta Fines T, MD       polyethylene glycol (MIRALAX / GLYCOLAX) packet 17 g  17 g Per Tube Daily Candee Furbish, MD       rocuronium bromide 100 MG/10ML SOSY            senna (SENOKOT) tablet 8.6 mg  1 tablet Oral Daily PRN Wynetta Fines T, MD       sevelamer carbonate (RENVELA) tablet 2,400 mg  2,400 mg Oral TID WC Lequita Halt, MD       simethicone [facility]) chewable tablet 160 mg  160 mg Oral BID Wynetta Fines T, MD       sodium chloride flush (NS) 0.9 % injection 3 mL  3 mL Intravenous Q12H Wynetta Fines T, MD   3 mL at 01-24-2023 0904   sodium chloride flush (NS) 0.9 % injection 3 mL  3 mL Intravenous PRN Wynetta Fines T, MD       traZODone (DESYREL) tablet 150 mg  150 mg Oral QHS Lequita Halt, MD       Labs: Basic Metabolic Panel: Recent Labs  Lab 11/29/2022 1009 24-Jan-2023 0527 2023/01/24 0627 01-24-2023 1023  NA 137 135 134* 134*  K 5.2* 4.0 4.2 4.0  CL 99 96*  --   --  CO2 24 24  --   --   GLUCOSE 92 81  --   --   BUN 64* 39*  --   --   CREATININE 10.59* 6.97*  --   --   CALCIUM 9.3 8.8*  --   --    Liver Function  Tests: Recent Labs  Lab 12/16/2022 1009  AST 29  ALT 13  ALKPHOS 59  BILITOT 2.4*  PROT 7.4  ALBUMIN 3.5   No results for input(s): "LIPASE", "AMYLASE" in the last 168 hours. No results for input(s): "AMMONIA" in the last 168 hours. CBC: Recent Labs  Lab 12/14/2022 1009 01-15-23 0527 01/15/2023 0627 January 15, 2023 1023  WBC 11.3* 9.5  --   --   HGB 9.5* 8.9* 9.5* 8.8*  HCT 30.5* 28.7* 28.0* 26.0*  MCV 86.2 85.7  --   --   PLT 121* 103*  --   --    Cardiac Enzymes: No results for input(s): "CKTOTAL", "CKMB", "CKMBINDEX", "TROPONINI" in the last 168 hours. CBG: Recent Labs  Lab 01/15/2023 0813 2023-01-15 0853 01-15-2023 0921 15-Jan-2023 1011  GLUCAP 78 69* 148* 105*   Iron Studies: No results for input(s): "IRON", "TIBC", "TRANSFERRIN", "FERRITIN" in the last 72 hours. Studies/Results: Portable Chest x-ray  Result Date: 01-15-2023 CLINICAL DATA:  Endotracheal tube placement. EXAM: PORTABLE CHEST 1 VIEW COMPARISON:  One-view chest x-ray 12/19/2022. FINDINGS: The patient has been intubated. Endotracheal tube terminates 4 cm above the carina. Cardiac silhouette is enlarged. Slight increase in the diffuse interstitial pattern noted. Mild bibasilar atelectasis is present without significant airspace consolidation. A small right pleural effusion is present. IVC dialysis catheter is stable. IMPRESSION: 1. Interval intubation. Endotracheal tube terminates 4 cm above the carina. 2. Slight increase in diffuse interstitial pattern compatible with edema. 3. Small right pleural effusion. Electronically Signed   By: San Morelle M.D.   On: 2023-01-15 10:15   CT ANGIO HEAD NECK W WO CM (CODE STROKE)  Result Date: 2023-01-15 CLINICAL DATA:  47 year old female with history of complicated diabetes, end-stage renal disease, and recent PE being anticoagulated. Stroke-like symptoms with aphasia and right side weakness. EXAM: CT ANGIOGRAPHY HEAD AND NECK TECHNIQUE: Multidetector CT imaging of the head and neck  was performed using the standard protocol during bolus administration of intravenous contrast. Multiplanar CT image reconstructions and MIPs were obtained to evaluate the vascular anatomy. Carotid stenosis measurements (when applicable) are obtained utilizing NASCET criteria, using the distal internal carotid diameter as the denominator. RADIATION DOSE REDUCTION: This exam was performed according to the departmental dose-optimization program which includes automated exposure control, adjustment of the mA and/or kV according to patient size and/or use of iterative reconstruction technique. CONTRAST:  13mL OMNIPAQUE IOHEXOL 350 MG/ML SOLN COMPARISON:  Plain head CT 0822 hours today.  No prior CTA. 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 In the cervical spine. 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 Upper chest: Partially visible layering right pleural effusion with adjacent right upper lobe airspace disease. No superior mediastinal lymphadenopathy. Other neck: Diffusely edematous appearance of the superficial soft tissues and widespread prominent subcutaneous venous collaterals, including at the anterior chest wall, also about the scalp, both medial orbits. Intubated.  Endotracheal tube tip visible above the carina. Aortic arch: Calcified aortic atherosclerosis. 3 vessel arch configuration. Right carotid system: Calcified right CCA with no significant stenosis before the bifurcation. Bulky calcified right carotid bifurcation and proximal right ICA but with less than 50 % stenosis with respect to the distal vessel. Left carotid  system: Calcified left CCA origin and left CCA before the bifurcation with less than 50 % stenosis with respect to the distal vessel. Bulky calcified plaque at the left carotid bifurcation and extending into the bulb. Stenosis estimated at 50-60 % with respect to the distal vessel. Left ICA remains patent to the skull base. Vertebral arteries:  Calcified right subclavian artery origin without stenosis. Right vertebral artery origin remains normal. Late entry of the right vertebral into the cervical transverse foramen. The vessel remains patent to the skull base with only mild stenosis. Calcified proximal left subclavian artery without stenosis. Left vertebral artery origin is normal but there is left V1 calcification with moderate stenosis on series 12, image 279. Tandem stenoses in that segment due to calcified plaque. The left vertebral artery remains patent through the V2 segment. V3 segment calcified plaque with only mild additional stenosis up to the skull base. CTA HEAD Posterior circulation: Fairly codominant vertebral V4 segments with patent vertebrobasilar junction. No significant plaque or stenosis on the right. Right PICA origin is patent. Left V4 PICA origin is normal with calcified plaque distal to that and up to moderate left V4 stenosis. Patent basilar artery without stenosis. Patent basilar tip, SCA and PCA origins. Posterior communicating arteries are diminutive or absent. Bilateral PCA branches are patent with mild to moderate P2 and P3 segment irregularity. Anterior circulation: Both ICA siphons are patent. Extensive calcified plaque on the left especially in the cavernous segment with moderate to severe cavernous stenosis (series 12, image 118). But the left ICA terminus remains patent. Contralateral right siphon heavy cavernous segment calcified plaque as well with moderate to severe stenosis at the anterior genu on series 14, image 87. Distal right ICA and right ICA terminus remain patent. MCA and ACA origins remain patent. Diminutive or absent anterior communicating artery. Bilateral ACA branches are within normal limits. Left MCA M1 segment is patent but diminutive, irregular. Long segment diminutive appearance of that vessel but patent left MCA trifurcation with no focal stenosis identified. No left MCA branch occlusion is  identified. Contralateral right MCA M1 segment is also diminutive but appears more normal. Patent right MCA trifurcation. Right MCA branches are within normal limits. Venous sinuses: Moderate to severe hypertrophy of the left internal cerebral vein with similar large venous collaterals in the left middle cranial fossa and surrounding the temporal lobe as well as some of the left MCA branches. But no AVM nidus or hypertrophied feeding artery is identified. And there are superimposed prominent orbital venous collaterals also bilaterally. And superimposed face, scalp, and chest wall venous collaterals as described above. No major dural venous sinus thrombosis is evident, although evaluation is suboptimal. Anatomic variants: None significant. Review of the MIP images confirms the above findings IMPRESSION: 1. Negative for large vessel occlusion. 2. Positive for diffuse atherosclerosis in the head and neck. Up to 60% carotid stenosis in the neck, and more advanced Moderate To Severe Bilateral ICA siphon stenosis due to calcified plaque. Moderate tandem stenoses of the Left Vertebral Artery. 3. Superimposed abnormal left internal cerebral vein, associated with hypertrophied left hemisphere veins but with no discrete AVM or evidence of arterial supply. Dural AV fistula is possible, although might also be related to systemic venous abnormality in the setting of End stage renal disease, with similar prominent venous collaterals throughout the scalp, face, and chest wall. 4. Partially visible evidence of anasarca, right pleural effusion. 5.  Aortic Atherosclerosis (ICD10-I70.0). Salient findings were discussed by telephone with Dr. Curly Sh[place] on 01-15-2023 at  0954 hours. Electronically Signed   By: Genevie Ann M.D.   On: January 18, 2023 10:07   CT HEAD WO CONTRAST (5MM)  Result Date: 01-18-2023 CLINICAL DATA:  Delirium.  Neurologic changes following dialysis. EXAM: CT HEAD WITHOUT CONTRAST TECHNIQUE: Contiguous axial images were obtained  from the base of the skull through the vertex without intravenous contrast. RADIATION DOSE REDUCTION: This exam was performed according to the departmental dose-optimization program which includes automated exposure control, adjustment of the mA and/or kV according to patient size and/or use of iterative reconstruction technique. COMPARISON:  11/07/2022. FINDINGS: Brain: No evidence of acute infarction, hemorrhage, hydrocephalus, extra-axial collection or mass lesion/mass effect. Bilateral posterior parietal encephalomalacia is again noted and appears unchanged from previous exam. There is mild diffuse low-attenuation within the subcortical and periventricular white matter compatible with chronic microvascular disease. Prominence of the sulci compatible with mild age advanced brain atrophy. Vascular: No hyperdense vessel or unexpected calcification. Skull: Normal. Negative for fracture or focal lesion. Sinuses/Orbits: No acute finding. Other: None IMPRESSION: 1. No acute intracranial abnormalities. 2. Chronic small vessel ischemic disease and mild age advanced brain atrophy. 3. Stable appearance of mild bilateral posterior parietal encephalomalacia. Electronically Signed   By: Kerby Moors M.D.   On: 2023-01-18 08:46   CT Angio Chest PE W/Cm &/Or Wo Cm  Result Date: 12/06/2022 CLINICAL DATA:  47 year old female with chest pain. Patient with end-stage renal disease on dialysis. EXAM: CT ANGIOGRAPHY CHEST WITH CONTRAST TECHNIQUE: Multidetector CT imaging of the chest was performed using the standard protocol during bolus administration of intravenous contrast. Multiplanar CT image reconstructions and MIPs were obtained to evaluate the vascular anatomy. RADIATION DOSE REDUCTION: This exam was performed according to the departmental dose-optimization program which includes automated exposure control, adjustment of the mA and/or kV according to patient size and/or use of iterative reconstruction technique.  CONTRAST:  15mL OMNIPAQUE IOHEXOL 350 MG/ML SOLN COMPARISON:  10/20/2022 CT and prior studies FINDINGS: Cardiovascular: Satisfactory opacification of the pulmonary arteries to the segmental level. Segmental pulmonary emboli are identified within the RIGHT middle lobe, RIGHT LOWER lobe, lingula and LEFT LOWER lobe (best identified on the thin sections). Prominent main pulmonary artery again noted measuring 4.1 cm and diameter. Cardiomegaly and small to moderate pericardial effusion again noted. Heavy coronary artery and aortic atherosclerotic calcifications are again identified. There is no evidence of thoracic aortic aneurysm. A central venous catheter extending from the abdomen is noted with tip in the RIGHT atrium. Linear calcification along the walls of the UPPER SVC and innominate vein again noted. Extensive superficial venous collaterals within the chest noted. Mediastinum/Nodes: Mildly enlarged bilateral axillary and mediastinal lymph nodes are unchanged. The visualized trachea, soft [place] thyroid are unremarkable. Lungs/Pleura: A small to moderate RIGHT pleural effusion is noted. Diffuse ground-glass opacities are present and not significantly changed. Mild RIGHT basilar atelectasis is noted. There is no evidence of pneumothorax. Upper Abdomen: No acute abnormality. Musculoskeletal: Diffuse subcutaneous edema is present. No acute bony abnormalities are identified. Review of the MIP images confirms the above findings. IMPRESSION: 1. Bilateral segmental pulmonary emboli. Critical Value/emergent results were called by telephone at the time of interpretation on 12/21/2022 at 1:13 pm to provider Joanette Gula , who verbally acknowledged these results. 2. Small to moderate RIGHT pleural effusion, diffuse ground-glass opacities and diffuse subcutaneous edema. 3. Cardiomegaly, small to moderate pericardial effusion and coronary artery disease. 4. Prominent main pulmonary artery again noted, compatible with  pulmonary arterial hypertension. 5.  Aortic Atherosclerosis (ICD10-I70.0). Electronically Signed  By: Margarette Canada M.D.   On: 12/23/2022 13:14   DG Chest Port 1 View  Result Date: 12/04/2022 CLINICAL DATA:  47 year old female with chest pain, nausea, chills. Dialysis patient. EXAM: PORTABLE CHEST 1 VIEW COMPARISON:  Portable chest 11/07/2022 and earlier. FINDINGS: Portable AP upright view at 1013 hours. Stable somewhat low lung volumes. Stable cardiomegaly and mediastinal contours. Visualized tracheal air column is within normal limits. No pneumothorax, pleural effusion or consolidation. Pulmonary vascular congestion has not significantly changed, no overt edema. Inferior approach vascular catheter projecting over the right atrium is stable. No acute osseous abnormality identified. Negative visible bowel gas. IMPRESSION: No acute cardiopulmonary abnormality. Electronically Signed   By: Genevie Ann M.D.   On: 12/01/2022 10:27    ROS: Unable to assess at this time d/t patient being intubated and sedated. Critical care team currently placing in a central line.  Physical Exam: Vitals:   01/24/2023 1045 2023-01-24 1100 01/24/2023 1115 Jan 24, 2023 1117  BP: (!) 84/57 (!) 87/60 (!) 78/57 (!) 78/57  Pulse: 86 88 82 85  Resp: (!) 25 18 19  (!) 21  Temp:      TempSrc:      SpO2: 100% 100% 100% 100%  Weight:      Height:         Unable to perform physical assessment at this time d/t critical care team placing in a central line.  Pt with eyes open, not tracking-  lungs sound clear-  RRR-  abdomen distended- no peripheral edema-  femoral TDC in place   Dialysis Orders:  MWF-[place] *Will need to obtain outpatient records Access: Femoral HD catheter   Last Labs: Troponin 2120, Hgb 8.9, SrCr 6.97, BUN 39, Na 135, K 4.0, Ca 8.8  Assessment/Plan: Acute bilateral segmental PE - cardiology following; on heparin drip and ECHO ordered; per Cardiology: recommends hematology consult d/t hx DVT/PE/SVC  thrombosis Small-moderate pericardial effusion - unclear etiology; ECHO ordered; managed by critical care team Acute hypoxic resp failure - 2nd volume overload and pulmonary hypertension. Received emergent HD earlier this morning. Unable to assess volume at this time d/t critical care team placing in a central line. Continue to follow volume status very closely. Seems euvolemic right now ESRD -  on HD MWF (from [place]) and plan to obtain outpatient records. Noted net UF 3L today. Follow volume closely. Next HD either tomorrow or Wednesday depending on clinical status Hypertension/volume  - Bps soft (SBPs in 80-90s), now on Levophed infusion. Removed 3L today with HD. Monitor volume closely. Anemia of CKD - Hgb 8.8. Will need to obtain outpatient records. More likely will start an ESA here. Will order iron studies.  Secondary Hyperparathyroidism - Checking renal labs in AM and obtain records. Nutrition - NPO for now  Tobie Poet, NP [facility] 2023/01/24, 11:21 AM    Patient seen and examined, agree with above note with above modifications. Chronically ill BF presented with PE with complications-  now s/p PEA arrest.  Dont know when her last OP HD was-  we were able to get her a treatment early this AM before she had decompensation.   Pt is now DNR-  will take things day by day as far as when her next HD would take place  Corliss Parish, MD 01-24-23

## 2023-01-26 NOTE — Progress Notes (Signed)
CARDIOLOGY CONSULT NOTE    Patient ID: Rebekah Burton; [id number]; 04/23/1976   Admit date: 12/14/2022 Date of Consult: 10-Jan-2023  Primary Care Provider: [name], NP Primary Cardiologist: None Primary Electrophysiologist: None   Patient Profile:   Rebekah Burton is a 47 y.o. female with a hx of ESRD DD (nonfunctioning LUE fistula and intact right femoral tunneled Catheter), HTN, PAD s/p Hx of R femoral to popliteal artery bypass (with severe scarring in R groin), femoral endarterectomy in 2020, s/p left femoral-popliteal bypass, metatarsal amputation of the left foot and removal of all toes on the right foot, SVC thrombosis in 2023 who is being seen today for the evaluation of troponin elevation at the request of Dr Waldron Labs.  History of Present Illness:   [name] is a 47 year old F known to have ESRD DD, HTN, PAD with bilateral femoral-popliteal bypasses presented to the ER with sudden onset of chest pain and SOB x 1 day prior to presentation. She was borderline hypoxic upon arrival to the ER and CT angio of chest showed acute bilateral lower segmental pulmonary embolism with moderate right-sided pleural effusion and cardiomegaly with small to moderate pericardial effusion. Heavy coronary artery atherosclerotic calcifications were also identified. High sensitive troponins were checked, 2264, 2120. EKG showed sinus tachycardia with no ST-T changes.  Overnight, 3L of fluid was removed through emergent HD.  Patient is lethargic during my interview. She is answering yes to all the questions I asked not following commands.Per overnight nurse, she was alert and oriented x 2 and was motivated when she initially presented to the ER.  Due to this new change in her mental status, CT head was ordered but not performed yet. She is currently on heparin drip for PE.  Past Medical History:  Diagnosis Date   Anemia    Blind    Blind    ESRD (end stage renal disease) ([place])    Foot ulceration  ([place]) 12/2018   History of transmetatarsal amputation of left foot (HCC)    History of transmetatarsal amputation of right foot (HCC)    Hypertension    PAD (peripheral artery disease) ([place])    Tobacco abuse     Past Surgical History:  Procedure Laterality Date   ABDOMINAL AORTOGRAM W/LOWER EXTREMITY Bilateral 01/17/2019   Procedure: ABDOMINAL AORTOGRAM W/LOWER EXTREMITY;  Surgeon: Waynetta Sandy, MD;  Location: [place] [facility];  Service: Cardiovascular;  Laterality: Bilateral;   ABDOMINAL HYSTERECTOMY     BUBBLE STUDY  07/14/2022   Procedure: BUBBLE STUDY;  Surgeon: Berniece Salines, DO;  Location: [place];  Service: Cardiovascular;;   ENDARTERECTOMY FEMORAL Left 01/18/2019   Procedure: ENDARTERECTOMY FEMORAL with Perfundaplasty;  Surgeon: Rosetta Posner, MD;  Location: [place];  Service: Vascular;  Laterality: Left;   FEMORAL-POPLITEAL BYPASS GRAFT Left 01/18/2019   Procedure: Left  FEMORAL- to  Below the Knee POPLITEAL ARTERY bypass using reversed safenous vein.;  Surgeon: Rosetta Posner, MD;  Location: [place];  Service: Vascular;  Laterality: Left;   INCISION AND DRAINAGE ABSCESS Left 01/15/2021   Procedure: INCISION AND DRAINAGE BREAST ABSCESS;  Surgeon: Dwan Bolt, MD;  Location: [place];  Service: General;  Laterality: Left;   INCISION AND DRAINAGE ABSCESS Left 02/25/2021   Procedure: INCISION AND DRAINAGE BREAST  ABSCESS;  Surgeon: Dwan Bolt, MD;  Location: [place];  Service: General;  Laterality: Left;   INCISION AND DRAINAGE ABSCESS Left 10/26/2021   Procedure: INCISION AND DRAINAGE OF RECURRENT LEFT BREAST ABSCESS;  Surgeon: Donnie Mesa, MD;  Location: [place];  Service: General;  Laterality: Left;   INSERTION OF DIALYSIS CATHETER N/A 07/11/2022   Procedure: INSERTION OF RIGHT FEMORAL TEMPORARY DIALYSIS CATHETER;  Surgeon: Cherre Robins, MD;  Location: [place];  Service: Vascular;  Laterality: N/A;   INSERTION OF DIALYSIS CATHETER Right 07/18/2022   Procedure:  INSERTION OF TUNNELED DIALYSIS CATHETER;  Surgeon: Angelia Mould, MD;  Location: [facility];  Service: Vascular;  Laterality: Right;   IR DIALY SHUNT INTRO NEEDLE/INTRACATH INITIAL W/IMG LEFT Left 01/28/2021   IR THROMBECTOMY AV FISTULA W/THROMBOLYSIS/PTA/STENT INC/SHUNT/IMG LT Left 10/23/2020   IR US GUIDE BX ASP/DRAIN  01/28/2021   IR US GUIDE VASC ACCESS LEFT  01/26/2021   REVISON OF ARTERIOVENOUS FISTULA Left 07/11/2022   Procedure: EXCISION OF INFECTED LEFT ARM ARTERIOVENOUS FISTULA;  Surgeon: Cherre Robins, MD;  Location: [place];  Service: Vascular;  Laterality: Left;   TEE WITHOUT CARDIOVERSION N/A 07/14/2022   Procedure: TRANSESOPHAGEAL ECHOCARDIOGRAM (TEE);  Surgeon: Berniece Salines, DO;  Location: [facility];  Service: Cardiovascular;  Laterality: N/A;   toe removal Right    All toes on right foot have been removed.    TRANSMETATARSAL AMPUTATION Left 01/18/2019   Procedure: TRANSMETATARSAL AMPUTATION;  Surgeon: Rosetta Posner, MD;  Location: [facility];  Service: Vascular;  Laterality: Left;     Home Medications:  Prior to Admission medications   Medication Sig Start Date End Date Taking? Authorizing Provider  acetaminophen (TYLENOL) 325 MG tablet Take 2 tablets (650 mg total) by mouth every 6 (six) hours as needed for mild pain (or Fever >/= 101). 07/19/22   Oswald Hillock, MD  b complex-vitamin c-folic acid (NEPHRO-VITE) 0.8 MG TABS tablet Take 1 tablet by mouth every Monday, Wednesday, and Friday with hemodialysis. 03/17/21   Samella Parr, NP  carvedilol (COREG) 25 MG tablet Take 1 tablet (25 mg total) by mouth 2 (two) times daily. 03/17/21   Samella Parr, NP  cloNIDine (CATAPRES) 0.3 MG tablet Take 1 tablet (0.3 mg total) by mouth 3 (three) times daily. 03/17/21   Samella Parr, NP  hydrALAZINE (APRESOLINE) 10 MG tablet Take 10 mg by mouth in the morning and at bedtime. 10/18/22   [provider]  ondansetron (ZOFRAN) 4 MG tablet Take 4 mg by mouth every 8 (eight) hours as  needed for nausea or vomiting. 07/26/22   [provider]  pantoprazole (PROTONIX) 40 MG tablet Take 1 tablet (40 mg total) by mouth daily. 03/17/21   Samella Parr, NP  senna (SENOKOT) 8.6 MG TABS tablet Take 1 tablet (8.6 mg total) by mouth daily as needed for mild constipation. 10/25/22   Johnson, Clanford L, MD  sevelamer carbonate (RENVELA) 800 MG tablet Take 3 tablets by mouth in the morning, at noon, and at bedtime. 08/26/22   [provider]  simethicone (MYLICON) 80 MG chewable tablet Chew 2 tablets by mouth 2 (two) times daily. 10/13/22   [provider]  traZODone (DESYREL) 150 MG tablet Take 150 mg by mouth at bedtime. 05/01/21   [provider]    Inpatient Medications: Scheduled Meds:  aspirin EC  81 mg Oral Daily   atorvastatin  80 mg Oral Daily   carvedilol  6.25 mg Oral BID WC   Chlorhexidine Gluconate Cloth  6 each Topical Q0600   sodium chloride flush  3 mL Intravenous Q12H   sodium chloride flush  3 mL Intravenous Q12H   Continuous Infusions:  sodium  chloride     sodium chloride     heparin 900 Units/hr (11/28/2022 1950)   PRN Meds: sodium chloride, sodium chloride, acetaminophen, ondansetron (ZOFRAN) IV, sodium chloride flush, sodium chloride flush  Allergies:   No Known Allergies  Social History:   Social History   Socioeconomic History   Marital status: Single    Spouse name: Not on file   Number of children: Not on file   Years of education: Not on file   Highest education level: Not on file  Occupational History   Not on file  Tobacco Use   Smoking status: Every Day    Packs/day: 0.50    Years: 20.00    Total pack years: 10.00    Types: Cigarettes    Last attempt to quit: 01/14/2019    Years since quitting: 3.9    Passive exposure: Current   Smokeless tobacco: Never   Tobacco comments:    <1 PPD  Vaping Use   Vaping Use: Never used  Substance and Sexual Activity   Alcohol use: Never   Drug use: Not Currently     Types: Marijuana   Sexual activity: Never  Other Topics Concern   Not on file  Social History Narrative   Not on file   Social Determinants of Health   Financial Resource Strain: Not on file  Food Insecurity: No Food Insecurity (10/24/2022)   Hunger Vital Sign    Worried About Running Out of Food in the Last Year: Never true    Ran Out of Food in the Last Year: Never true  Transportation Needs: No Transportation Needs (10/24/2022)   PRAPARE - Hydrologist (Medical): No    Lack of Transportation (Non-Medical): No  Physical Activity: Not on file  Stress: Not on file  Social Connections: Not on file  Intimate Partner Violence: Not At Risk (10/24/2022)   Humiliation, Afraid, Rape, and Kick questionnaire    Fear of Current or Ex-Partner: No    Emotionally Abused: No    Physically Abused: No    Sexually Abused: No    Family History:   Family History  Problem Relation Age of Onset   Peripheral Artery Disease Neg Hx    Kidney failure Neg Hx      ROS:  Please see the history of present illness.  ROS All other ROS reviewed and negative.     Physical Exam/Data:   Vitals:   2023-01-20 0615 01-20-23 0630 2023-01-20 0645 01-20-23 0700  BP: (!) 122/93 119/84 121/87 108/74  Pulse: (!) 104 100 (!) 106 (!) 105  Resp: (!) 28 (!) 31 (!) 26 (!) 25  Temp:      TempSrc:      SpO2: 94% 99% 94% 95%  Weight:      Height:        Intake/Output Summary (Last 24 hours) at January 20, 2023 0732 Last data filed at 2023-01-20 0407 Gross per 24 hour  Intake --  Output 3 ml  Net -3 ml   Filed Weights   12/09/2022 0956  Weight: 79.8 kg   Body mass index is 30.21 kg/m.  General: Patient is lethargic HEENT: normal Lymph: no adenopathy Neck: no JVD Endocrine:  No thryomegaly Cardiac:  normal S1, S2; RRR; no murmur  Lungs:  clear to auscultation bilaterally, no wheezing, rhonchi or rales  Abd: soft, nontender, no hepatomegaly  Ext: no edema Musculoskeletal:  No  deformities Skin: warm and dry  Neuro: Unable to examine completely due  to patient not cooperating Psych: Patient lethargic  EKG:  The EKG was personally reviewed and demonstrates: Sinus tachycardia no ST changes Telemetry:  Telemetry was personally reviewed and demonstrates: Sinus tachycardia  Relevant CV Studies: Echo from 06/2022 LVEF 65 to 70% with no RWMA Severe LVH Grade 2 diastolic dysfunction Moderate elevation of PASP LA severely dilated RA mildly dilated Thank you degenerative mitral valve, moderate MAC, moderate MS and trivial MR No evidence of aortic stenosis  Laboratory Data:  Chemistry Recent Labs  Lab 11/29/2022 1009 20-Jan-2023 0527 2023-01-20 0627  NA 137 135 134*  K 5.2* 4.0 4.2  CL 99 96*  --   CO2 24 24  --   GLUCOSE 92 81  --   BUN 64* 39*  --   CREATININE 10.59* 6.97*  --   CALCIUM 9.3 8.8*  --   GFRNONAA 4* 7*  --   ANIONGAP 14 15  --     Recent Labs  Lab 11/25/2022 1009  PROT 7.4  ALBUMIN 3.5  AST 29  ALT 13  ALKPHOS 59  BILITOT 2.4*   Hematology Recent Labs  Lab 12/23/2022 1009 20-Jan-2023 0527 01/20/2023 0627  WBC 11.3* 9.5  --   RBC 3.54* 3.35*  --   HGB 9.5* 8.9* 9.5*  HCT 30.5* 28.7* 28.0*  MCV 86.2 85.7  --   MCH 26.8 26.6  --   MCHC 31.1 31.0  --   RDW 18.9* 19.0*  --   PLT 121* 103*  --    Cardiac EnzymesNo results for input(s): "TROPONINI" in the last 168 hours. No results for input(s): "TROPIPOC" in the last 168 hours.  BNP Recent Labs  Lab 11/26/2022 1009  BNP >4,500.0*    DDimer No results for input(s): "DDIMER" in the last 168 hours.  Radiology/Studies:  CT Angio Chest PE W/Cm &/Or Wo Cm  Result Date: 12/20/2022 CLINICAL DATA:  47 year old female with chest pain. Patient with end-stage renal disease on dialysis. EXAM: CT ANGIOGRAPHY CHEST WITH CONTRAST TECHNIQUE: Multidetector CT imaging of the chest was performed using the standard protocol during bolus administration of intravenous contrast. Multiplanar CT image  reconstructions and MIPs were obtained to evaluate the vascular anatomy. RADIATION DOSE REDUCTION: This exam was performed according to the departmental dose-optimization program which includes automated exposure control, adjustment of the mA and/or kV according to patient size and/or use of iterative reconstruction technique. CONTRAST:  28m OMNIPAQUE IOHEXOL 350 MG/ML SOLN COMPARISON:  10/20/2022 CT and prior studies FINDINGS: Cardiovascular: Satisfactory opacification of the pulmonary arteries to the segmental level. Segmental pulmonary emboli are identified within the RIGHT middle lobe, RIGHT LOWER lobe, lingula and LEFT LOWER lobe (best identified on the thin sections). Prominent main pulmonary artery again noted measuring 4.1 cm and diameter. Cardiomegaly and small to moderate pericardial effusion again noted. Heavy coronary artery and aortic atherosclerotic calcifications are again identified. There is no evidence of thoracic aortic aneurysm. A central venous catheter extending from the abdomen is noted with tip in the RIGHT atrium. Linear calcification along the walls of the UPPER SVC and innominate vein again noted. Extensive superficial venous collaterals within the chest noted. Mediastinum/Nodes: Mildly enlarged bilateral axillary and mediastinal lymph nodes are unchanged. The visualized trachea, soft [place] thyroid are unremarkable. Lungs/Pleura: A small to moderate RIGHT pleural effusion is noted. Diffuse ground-glass opacities are present and not significantly changed. Mild RIGHT basilar atelectasis is noted. There is no evidence of pneumothorax. Upper Abdomen: No acute abnormality. Musculoskeletal: Diffuse subcutaneous edema is present. No acute  bony abnormalities are identified. Review of the MIP images confirms the above findings. IMPRESSION: 1. Bilateral segmental pulmonary emboli. Critical Value/emergent results were called by telephone at the time of interpretation on 12/23/2022 at 1:13 pm to  provider Joanette Gula , who verbally acknowledged these results. 2. Small to moderate RIGHT pleural effusion, diffuse ground-glass opacities and diffuse subcutaneous edema. 3. Cardiomegaly, small to moderate pericardial effusion and coronary artery disease. 4. Prominent main pulmonary artery again noted, compatible with pulmonary arterial hypertension. 5.  Aortic Atherosclerosis (ICD10-I70.0). Electronically Signed   By: Margarette Canada M.D.   On: 12/09/2022 13:14   DG Chest Port 1 View  Result Date: 12/02/2022 CLINICAL DATA:  47 year old female with chest pain, nausea, chills. Dialysis patient. EXAM: PORTABLE CHEST 1 VIEW COMPARISON:  Portable chest 11/07/2022 and earlier. FINDINGS: Portable AP upright view at 1013 hours. Stable somewhat low lung volumes. Stable cardiomegaly and mediastinal contours. Visualized tracheal air column is within normal limits. No pneumothorax, pleural effusion or consolidation. Pulmonary vascular congestion has not significantly changed, no overt edema. Inferior approach vascular catheter projecting over the right atrium is stable. No acute osseous abnormality identified. Negative visible bowel gas. IMPRESSION: No acute cardiopulmonary abnormality. Electronically Signed   By: Genevie Ann M.D.   On: 12/11/2022 10:27    Assessment and Plan:   Patient is a 47 year old F known to have PAD s/p bilateral femoropopliteal bypasses, ESRD DD, HTN, moderate mitral stenosis presented to the ER with onset of chest pain and SOB and found to have acute bilateral segmental PE.  High-sensitivity troponins were significantly elevated, 2k.  EKG showed sinus tachycardia and no ST changes.  # Acute myocardial injury likely secondary to acute bilateral segmental PE, however CAD cannot be ruled out -High-sensitivity troponins were elevated, 2264-->2120. EKG showed sinus tachycardia and no ST-T changes. Significant troponin elevation might be secondary to acute bilateral segmental PE with possible RV  dysfunction however CAD cannot be ruled out due to the presence of heavy coronary calcifications on CT angio chest on admission. She will likely benefit from inpatient LHC pending neurologic improvement (lethargic and not following commands during my interview). -Continue ACS protocol with aspirin, high intensity statin and heparin drip.  Can hold heparin drip until ICH is ruled out.  # Small to moderate pericardial effusion on CT angio chest (on admission) -Obtain 2D echocardiogram -Continue HD  # Moderate mitral valve stenosis (per 7/23 echo) likely secondary to MAC -Surveillance with yearly echocardiograms -Outpatient follow-up with cardiology  # Acute bilateral segmental PE -Management per primary team -Patient would likely benefit from hematology consult due to history of multiple DVT/PE/SVC thrombosis  # ESRD DD -Management per primary team  I have spent a total of 55 minutes with patient reviewing chart , telemetry, EKGs, labs and examining patient as well as establishing an assessment and plan that was discussed with the patient.  > 50% of time was spent in direct patient care.    For questions or updates, please contact [place] Please consult [email or web address] for contact info under Cardiology/STEMI.   Signed, Vangie Bicker, MD 01/06/23 7:32 AM

## 2023-01-26 NOTE — Progress Notes (Signed)
245 mL of fentanyl wasted in stericycle. Witnessed by [employer], Therapist, sports.

## 2023-01-26 NOTE — ED Notes (Signed)
Upon entering pt's room, pt remains altered as described in notes from previous shift. Pt leaning into bedrail, left arm hanging from bed. Arm drops with little to no effort. This RN transported pt to CT immediately. Admitting MD just came to bedside to assess pt. Report given to [place], Therapist, sports.

## 2023-01-26 NOTE — Procedures (Signed)
Central Venous Catheter Insertion Procedure Note  Rebekah Burton  [id number]  March 13, 1976  Date:12-30-22  Time:12:02 PM   Provider Performing:Pryce Folts Cipriano Mile   Procedure: Insertion of Non-tunneled Central Venous Catheter(36556) with US guidance (11155)   Indication(s) Difficult access  Consent Unable to obtain consent due to emergent nature of procedure.  Anesthesia Topical only with 1% lidocaine   Timeout Verified patient identification, verified procedure, site/side was marked, verified correct patient position, special equipment/implants available, medications/allergies/relevant history reviewed, required imaging and test results available.  Sterile Technique Maximal sterile technique including full sterile barrier drape, hand hygiene, sterile gown, sterile gloves, mask, hair covering, sterile ultrasound probe cover (if used).  Procedure Description Area of catheter insertion was cleaned with chlorhexidine and draped in sterile fashion.  With real-time ultrasound guidance a central venous catheter was placed into the left femoral vein. Nonpulsatile blood flow and easy flushing noted in all ports.  The catheter was sutured in place and sterile dressing applied.  Complications/Tolerance None; patient tolerated the procedure well. Chest X-ray is ordered to verify placement for internal jugular or subclavian cannulation.   Chest x-ray is not ordered for femoral cannulation.  EBL Minimal  Specimen(s) None

## 2023-01-26 NOTE — IPAL (Signed)
  Interdisciplinary Goals of Care Family Meeting   Date carried out: 01/22/23  Location of the meeting: Bedside  Member's involved: Physician, Bedside Registered Nurse, and Family Member or next of kin  Durable Power of Attorney or acting medical decision maker: brother    Discussion: We discussed goals of care for [employer] .    Discussed tenuous baseline health and events over last 24h.  All in agreement to continue current aggressive care but allow natural death should her heart stop again.  Code status: DNR  Disposition: Continue current acute care  Time spent for the meeting: 8 minutes    Candee Furbish, MD  2023-01-22, 2:20 PM

## 2023-01-26 NOTE — Progress Notes (Signed)
Date and time results received: 01/21/23 1251 Test: troponin  Critical Value: 3,001 Name of Provider Notified: Tamala Julian MD Orders Received? Or Actions Taken?: none, expected result

## 2023-01-26 NOTE — Procedures (Signed)
Patient alert and oriented, brought to unit in bed.  Informed consent signed and in chart.   Treatment initiated: 0059 Treatment completed: 0407  Patient tolerated well. Transported back to unit. Patient is alert and not in acute distress.  Hand-off given to patient's nurse, Jacqulyn Bath, RN.  Access used: Femoral HD Catheter  Access issues: None  Total UF removed: 3 L Medication(s) given: None   Post HD VS: Temperature: 97.7 Blood Pressure: 107/85 Heart Rate: 95 Respiratory Rate: 26 Oxygen Saturation: 95  Post HD weight: Unable to obtain   Morton Peters, RN, BSN [facility]

## 2023-01-26 NOTE — Progress Notes (Signed)
Pt transported to CT and back to ED room 5 without any complications.

## 2023-01-26 NOTE — Progress Notes (Signed)
Full note to follow. Seems like a stroke. Intubated for airway protection. For CTA head/neck. May need CVL for access issues.  Erskine Emery MD PCCM

## 2023-01-26 NOTE — Progress Notes (Signed)
RT called by RN, pt expired on the ventilator at 1936. RT extubated pt with RN at bedside per nursing at [facility].

## 2023-01-26 NOTE — Progress Notes (Signed)
LTM EEG running - no initial skin breakdown - push button tested -[facility] notififeid.

## 2023-01-26 NOTE — ED Notes (Addendum)
Pt ambulated with writer on one side and another RN on patient's other side. Pt was slightly unsteady but was not falling over. Pt reports she feels about the same as what she does walking at home, except that she uses a walker at home. (No walkers were available to use at this time to compare). MD notified.

## 2023-01-26 NOTE — Progress Notes (Signed)
Dr. Tamala Julian at bedside inserting left femoral CVC and arterial line. Once sterile field cleaned up, arterial line connected to bedside monitor which revealed SBP in 30s. Smith MD notified and to bedside. Patient quickly PEA. See code sheet.

## 2023-01-26 NOTE — Code Documentation (Signed)
Critical care at bedside preparing for intubation.

## 2023-01-26 NOTE — Death Summary Note (Signed)
DEATH SUMMARY   Patient Details  Name: Rebekah Burton MRN: [id number] DOB: 03-03-1976  Admission/Discharge Information   Admit Date:  2023/01/24  Date of Death: Date of Death: 01/25/23  Time of Death: Time of Death: 1944-04-10  Length of Stay: 1  Referring Physician: [name], NP    Diagnoses  Preliminary cause of death:  Secondary Diagnoses (including complications and co-morbidities):  Principal Problem:   Pulmonary emboli ([place]) Active Problems:   Somnolence   Moderate mitral stenosis   Myocardial injury   Acute encephalopathy   Brief Hospital Course (including significant findings, care, treatment, and services provided and events leading to death)  47 year old woman with hx of ESRD, severe PVD with amputations who presents with chest pain and SOB. Workup revealed PE, pericardial effusion, troponin leak. Had HD on day 1 of hospitalization then came back altered. Code stroke called, PCCM consulted for agonal respirations.   Developed profound shock state and possible stroke symptoms.  Coded shortly after.  CT head neg.  Echo showing LV hypertrophy, pericardial effusion without tamponade, no McConnell's sign.  Went into irreversible multiorgan failure, lactic acidemia and after family discussions allowed to pass peacefully.   Pertinent Labs and Studies  Significant Diagnostic Studies VAS [place] LOWER EXTREMITY VENOUS (DVT)  Result Date: 12/27/2022  Lower Venous DVT Study Patient Name:  Rebekah Burton  Date of Exam:   01-25-2023 Medical Rec #: [id number]     Accession #:    [id number] Date of Birth: 05/22/1976     Patient Gender: F Patient Age:   47 years Exam Location:  [facility] Procedure:      VAS [place] LOWER EXTREMITY VENOUS (DVT) Referring Phys: Wynetta Fines --------------------------------------------------------------------------------  Indications: Pulmonary embolism.  Comparison Study: No previous LEV exams Performing Technologist: Jody Hill RVT, RDMS  Examination Guidelines:  A complete evaluation includes B-mode imaging, spectral Doppler, color Doppler, and power Doppler as needed of all accessible portions of each vessel. Bilateral testing is considered an integral part of a complete examination. Limited examinations for reoccurring indications may be performed as noted. The reflux portion of the exam is performed with the patient in reverse Trendelenburg.  +---------+---------------+---------+-----------+----------+--------------+ RIGHT    CompressibilityPhasicitySpontaneityPropertiesThrombus Aging +---------+---------------+---------+-----------+----------+--------------+ CFV      Full           Yes      Yes                                 +---------+---------------+---------+-----------+----------+--------------+ SFJ      Full                                                        +---------+---------------+---------+-----------+----------+--------------+ FV Prox  Full           No       Yes                                 +---------+---------------+---------+-----------+----------+--------------+ FV Mid   Full           No       Yes                                 +---------+---------------+---------+-----------+----------+--------------+  FV DistalFull           No       Yes                                 +---------+---------------+---------+-----------+----------+--------------+ PFV      Full                                                        +---------+---------------+---------+-----------+----------+--------------+ POP      Full           No       Yes                                 +---------+---------------+---------+-----------+----------+--------------+ PTV      Full                                                        +---------+---------------+---------+-----------+----------+--------------+ PERO     Full                                                         +---------+---------------+---------+-----------+----------+--------------+ pulsatile doppler waveforms of RLE  +----+---------------+---------+-----------+----------+--------------+ LEFTCompressibilityPhasicitySpontaneityPropertiesThrombus Aging +----+---------------+---------+-----------+----------+--------------+ CFV Full           No       Yes                  pulsatile      +----+---------------+---------+-----------+----------+--------------+     Summary: RIGHT: - There is no evidence of deep vein thrombosis proximal to the inguinal ligament or in the common femoral vein.  - No cystic structure found in the popliteal fossa.  LEFT: - No evidence of common femoral vein obstruction.  *See table(s) above for measurements and observations. Electronically signed by [name] MD on 12/27/2022 at 3:49:07 PM.    Final    Overnight EEG with video  Result Date: 12/27/2022 Lora Havens, MD     12/27/2022  7:12 AM Patient Name: Rebekah Burton MRN: [id number] Epilepsy Attending: Lora Havens Referring Physician/Provider: Lorenza Chick, MD Duration: 20-Jan-2023 1337 to 1942 Patient history: 47yo F with acute onset difficulty speaking and right-sided weakness. EEG to evaluate for seizure Level of alertness:  comatose AEDs during EEG study: None Technical aspects: This EEG study was done with scalp electrodes positioned according to the 10-20 International system of electrode placement. Electrical activity was reviewed with band pass filter of 1-70Hz , sensitivity of 7 uV/mm, display speed of 59mm/sec with a 60Hz  notched filter applied as appropriate. EEG data were recorded continuously and digitally stored.  Video monitoring was available and reviewed as appropriate. Description: EEG showed continuous generalized 3 to 6 Hz theta-delta slowing. Around 1930, eeg gradually showed diffuse background suppression. Patient was terminally extubated and EEG was disconnected. Hyperventilation and photic  stimulation were not performed.   ABNORMALITY - Continuous slow, generalized  IMPRESSION: This study was initially suggestive of severe diffuse encephalopathy, nonspecific etiology. Gradually after around 1930, EEG worsened and was suggestive of profound diffuse encephalopathy. Patient was then terminally extubated and EEG was disconnected.No seizures or epileptiform discharges were seen throughout the recording. Lora Havens   DG CHEST PORT 1 VIEW  Result Date: 01/12/23 CLINICAL DATA:  ETT placement EXAM: PORTABLE CHEST 1 VIEW COMPARISON:  December 26, 2021 FINDINGS: The ETT terminates in good position. The NG tube terminates below today's film. Cardiomegaly. Left retrocardiac opacity obscuring the left hemidiaphragm. Central haziness/Passy in the right mid lung. No pneumothorax. No other acute abnormalities. IMPRESSION: 1. Support apparatus as above. 2. Cardiomegaly and probable pulmonary edema. 3. More focal opacity in the left retrocardiac region is nonspecific. Electronically Signed   By: Dorise Bullion III M.D.   On: 01-12-23 12:55   DG Abd 1 View  Result Date: 01-12-2023 CLINICAL DATA:  Evaluate OG tube placement EXAM: ABDOMEN - 1 VIEW COMPARISON:  None Available. FINDINGS: The side port and distal tip of the OG tube terminates the left upper quadrant, within the stomach. IMPRESSION: The side port and distal tip of the OG tube terminates in the stomach. Electronically Signed   By: Dorise Bullion III M.D.   On: January 12, 2023 12:53   ECHOCARDIOGRAM LIMITED  Result Date: 01/12/23    ECHOCARDIOGRAM LIMITED REPORT   Patient Name:   Rebekah Burton Date of Exam: 12-Jan-2023 Medical Rec #:  [id number]    Height:       64.0 in Accession #:    [id number]   Weight:       176.0 lb Date of Birth:  02-12-1976    BSA:          1.853 m Patient Age:    75 years     BP:           87/63 mmHg Patient Gender: F            HR:           95 bpm. Exam Location:  Inpatient Procedure: Limited Echo, Cardiac Doppler and Color  Doppler Indications:    Chest pain  History:        Patient has prior history of Echocardiogram examinations, most                 recent 07/14/2022. PAD; Risk Factors:Hypertension. ESRD.  Sonographer:    Clayton Lefort RDCS (AE) Referring Phys: Janina Mayo  Sonographer Comments: Echo performed with patient supine and on artificial respirator. IMPRESSIONS  1. Left ventricular ejection fraction, by estimation, is 55 to 60%. The left ventricle has normal function. There is severe concentric left ventricular hypertrophy. Left ventricular diastolic function could not be evaluated.  2. The mitral valve is degenerative. Mild to moderate mitral valve regurgitation. Moderate mitral annular calcification.  3. The aortic valve is tricuspid. Aortic valve sclerosis/calcification is present, without any evidence of aortic stenosis.  4. Pulmonic valve regurgitation is moderate.  5. Right ventricular systolic function is moderately reduced. There is mildly elevated pulmonary artery systolic pressure. The estimated right ventricular systolic pressure is 82.9 mmHg.  6. There is a linear density in the IVC of unknown etiology. The inferior vena cava is dilated in size with <50% respiratory variability, suggesting right atrial pressure of 15 mmHg.  7. A small pericardial effusion is present. The pericardial effusion is circumferential. There is no evidence of cardiac tamponade. FINDINGS  Left Ventricle: Left ventricular ejection fraction, by estimation, is  55 to 60%. The left ventricle has normal function. There is severe concentric left ventricular hypertrophy. Left ventricular diastolic function could not be evaluated. Right Ventricle: Right ventricular systolic function is moderately reduced. There is mildly elevated pulmonary artery systolic pressure. The tricuspid regurgitant velocity is 2.62 m/s, and with an assumed right atrial pressure of 15 mmHg, the estimated right ventricular systolic pressure is 55.7 mmHg. Pericardium: A  small pericardial effusion is present. The pericardial effusion is circumferential. There is no evidence of cardiac tamponade. Mitral Valve: The mitral valve is degenerative in appearance. There is moderate thickening of the mitral valve leaflet(s). There is moderate calcification of the mitral valve leaflet(s). Moderate mitral annular calcification. Mild to moderate mitral valve regurgitation, with eccentric posteriorly directed jet. Tricuspid Valve: Tricuspid valve regurgitation is mild. Aortic Valve: The aortic valve is tricuspid. Aortic valve sclerosis/calcification is present, without any evidence of aortic stenosis. Pulmonic Valve: Pulmonic valve regurgitation is moderate. Venous: There is a linear density in the IVC of unknown etiology. The inferior vena cava is dilated in size with less than 50% respiratory variability, suggesting right atrial pressure of 15 mmHg. LEFT VENTRICLE PLAX 2D LVIDd:         3.90 cm LVIDs:         2.80 cm LV PW:         1.70 cm LV IVS:        1.60 cm  IVC IVC diam: 2.40 cm LEFT ATRIUM         Index LA diam:    3.60 cm 1.94 cm/m  TRICUSPID VALVE TR Peak grad:   27.5 mmHg TR Vmax:        262.00 cm/s Fransico Him MD Electronically signed by Fransico Him MD Signature Date/Time: 2023/01/24/12:42:09 PM    Final    Portable Chest x-ray  Result Date: 24-Jan-2023 CLINICAL DATA:  Endotracheal tube placement. EXAM: PORTABLE CHEST 1 VIEW COMPARISON:  One-view chest x-ray 11/28/2022. FINDINGS: The patient has been intubated. Endotracheal tube terminates 4 cm above the carina. Cardiac silhouette is enlarged. Slight increase in the diffuse interstitial pattern noted. Mild bibasilar atelectasis is present without significant airspace consolidation. A small right pleural effusion is present. IVC dialysis catheter is stable. IMPRESSION: 1. Interval intubation. Endotracheal tube terminates 4 cm above the carina. 2. Slight increase in diffuse interstitial pattern compatible with edema. 3. Small right  pleural effusion. Electronically Signed   By: San Morelle M.D.   On: 01/24/23 10:15   CT ANGIO HEAD NECK W WO CM (CODE STROKE)  Result Date: 01-24-23 CLINICAL DATA:  47 year old female with history of complicated diabetes, end-stage renal disease, and recent PE being anticoagulated. Stroke-like symptoms with aphasia and right side weakness. EXAM: CT ANGIOGRAPHY HEAD AND NECK TECHNIQUE: Multidetector CT imaging of the head and neck was performed using the standard protocol during bolus administration of intravenous contrast. Multiplanar CT image reconstructions and MIPs were obtained to evaluate the vascular anatomy. Carotid stenosis measurements (when applicable) are obtained utilizing NASCET criteria, using the distal internal carotid diameter as the denominator. RADIATION DOSE REDUCTION: This exam was performed according to the departmental dose-optimization program which includes automated exposure control, adjustment of the mA and/or kV according to patient size and/or use of iterative reconstruction technique. CONTRAST:  127mL OMNIPAQUE IOHEXOL 350 MG/ML SOLN COMPARISON:  Plain head CT 0822 hours today.  No prior CTA. 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 In the cervical spine. 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 Upper chest: Partially visible  layering right pleural effusion with adjacent right upper lobe airspace disease. No superior mediastinal lymphadenopathy. Other neck: Diffusely edematous appearance of the superficial soft tissues and widespread prominent subcutaneous venous collaterals, including at the anterior chest wall, also about the scalp, both medial orbits. Intubated.  Endotracheal tube tip visible above the carina. Aortic arch: Calcified aortic atherosclerosis. 3 vessel arch configuration. Right carotid system: Calcified right CCA with no significant stenosis before the bifurcation. Bulky calcified right carotid bifurcation and  proximal right ICA but with less than 50 % stenosis with respect to the distal vessel. Left carotid system: Calcified left CCA origin and left CCA before the bifurcation with less than 50 % stenosis with respect to the distal vessel. Bulky calcified plaque at the left carotid bifurcation and extending into the bulb. Stenosis estimated at 50-60 % with respect to the distal vessel. Left ICA remains patent to the skull base. Vertebral arteries: Calcified right subclavian artery origin without stenosis. Right vertebral artery origin remains normal. Late entry of the right vertebral into the cervical transverse foramen. The vessel remains patent to the skull base with only mild stenosis. Calcified proximal left subclavian artery without stenosis. Left vertebral artery origin is normal but there is left V1 calcification with moderate stenosis on series 12, image 279. Tandem stenoses in that segment due to calcified plaque. The left vertebral artery remains patent through the V2 segment. V3 segment calcified plaque with only mild additional stenosis up to the skull base. CTA HEAD Posterior circulation: Fairly codominant vertebral V4 segments with patent vertebrobasilar junction. No significant plaque or stenosis on the right. Right PICA origin is patent. Left V4 PICA origin is normal with calcified plaque distal to that and up to moderate left V4 stenosis. Patent basilar artery without stenosis. Patent basilar tip, SCA and PCA origins. Posterior communicating arteries are diminutive or absent. Bilateral PCA branches are patent with mild to moderate P2 and P3 segment irregularity. Anterior circulation: Both ICA siphons are patent. Extensive calcified plaque on the left especially in the cavernous segment with moderate to severe cavernous stenosis (series 12, image 118). But the left ICA terminus remains patent. Contralateral right siphon heavy cavernous segment calcified plaque as well with moderate to severe stenosis at  the anterior genu on series 14, image 87. Distal right ICA and right ICA terminus remain patent. MCA and ACA origins remain patent. Diminutive or absent anterior communicating artery. Bilateral ACA branches are within normal limits. Left MCA M1 segment is patent but diminutive, irregular. Long segment diminutive appearance of that vessel but patent left MCA trifurcation with no focal stenosis identified. No left MCA branch occlusion is identified. Contralateral right MCA M1 segment is also diminutive but appears more normal. Patent right MCA trifurcation. Right MCA branches are within normal limits. Venous sinuses: Moderate to severe hypertrophy of the left internal cerebral vein with similar large venous collaterals in the left middle cranial fossa and surrounding the temporal lobe as well as some of the left MCA branches. But no AVM nidus or hypertrophied feeding artery is identified. And there are superimposed prominent orbital venous collaterals also bilaterally. And superimposed face, scalp, and chest wall venous collaterals as described above. No major dural venous sinus thrombosis is evident, although evaluation is suboptimal. Anatomic variants: None significant. Review of the MIP images confirms the above findings IMPRESSION: 1. Negative for large vessel occlusion. 2. Positive for diffuse atherosclerosis in the head and neck. Up to 60% carotid stenosis in the neck, and more advanced Moderate To Severe  Bilateral ICA siphon stenosis due to calcified plaque. Moderate tandem stenoses of the Left Vertebral Artery. 3. Superimposed abnormal left internal cerebral vein, associated with hypertrophied left hemisphere veins but with no discrete AVM or evidence of arterial supply. Dural AV fistula is possible, although might also be related to systemic venous abnormality in the setting of End stage renal disease, with similar prominent venous collaterals throughout the scalp, face, and chest wall. 4. Partially visible  evidence of anasarca, right pleural effusion. 5.  Aortic Atherosclerosis (ICD10-I70.0). Salient findings were discussed by telephone with Dr. Curly Shores on 2023-01-03 at 0954 hours. Electronically Signed   By: Genevie Ann M.D.   On: 01-03-2023 10:07   CT HEAD WO CONTRAST (5MM)  Result Date: 01-03-23 CLINICAL DATA:  Delirium.  Neurologic changes following dialysis. EXAM: CT HEAD WITHOUT CONTRAST TECHNIQUE: Contiguous axial images were obtained from the base of the skull through the vertex without intravenous contrast. RADIATION DOSE REDUCTION: This exam was performed according to the departmental dose-optimization program which includes automated exposure control, adjustment of the mA and/or kV according to patient size and/or use of iterative reconstruction technique. COMPARISON:  11/07/2022. FINDINGS: Brain: No evidence of acute infarction, hemorrhage, hydrocephalus, extra-axial collection or mass lesion/mass effect. Bilateral posterior parietal encephalomalacia is again noted and appears unchanged from previous exam. There is mild diffuse low-attenuation within the subcortical and periventricular white matter compatible with chronic microvascular disease. Prominence of the sulci compatible with mild age advanced brain atrophy. Vascular: No hyperdense vessel or unexpected calcification. Skull: Normal. Negative for fracture or focal lesion. Sinuses/Orbits: No acute finding. Other: None IMPRESSION: 1. No acute intracranial abnormalities. 2. Chronic small vessel ischemic disease and mild age advanced brain atrophy. 3. Stable appearance of mild bilateral posterior parietal encephalomalacia. Electronically Signed   By: Kerby Moors M.D.   On: 01-03-2023 08:46   CT Angio Chest PE W/Cm &/Or Wo Cm  Result Date: 12/07/2022 CLINICAL DATA:  47 year old female with chest pain. Patient with end-stage renal disease on dialysis. EXAM: CT ANGIOGRAPHY CHEST WITH CONTRAST TECHNIQUE: Multidetector CT imaging of the chest was  performed using the standard protocol during bolus administration of intravenous contrast. Multiplanar CT image reconstructions and MIPs were obtained to evaluate the vascular anatomy. RADIATION DOSE REDUCTION: This exam was performed according to the departmental dose-optimization program which includes automated exposure control, adjustment of the mA and/or kV according to patient size and/or use of iterative reconstruction technique. CONTRAST:  19mL OMNIPAQUE IOHEXOL 350 MG/ML SOLN COMPARISON:  10/20/2022 CT and prior studies FINDINGS: Cardiovascular: Satisfactory opacification of the pulmonary arteries to the segmental level. Segmental pulmonary emboli are identified within the RIGHT middle lobe, RIGHT LOWER lobe, lingula and LEFT LOWER lobe (best identified on the thin sections). Prominent main pulmonary artery again noted measuring 4.1 cm and diameter. Cardiomegaly and small to moderate pericardial effusion again noted. Heavy coronary artery and aortic atherosclerotic calcifications are again identified. There is no evidence of thoracic aortic aneurysm. A central venous catheter extending from the abdomen is noted with tip in the RIGHT atrium. Linear calcification along the walls of the UPPER SVC and innominate vein again noted. Extensive superficial venous collaterals within the chest noted. Mediastinum/Nodes: Mildly enlarged bilateral axillary and mediastinal lymph nodes are unchanged. The visualized trachea, soft [place] thyroid are unremarkable. Lungs/Pleura: A small to moderate RIGHT pleural effusion is noted. Diffuse ground-glass opacities are present and not significantly changed. Mild RIGHT basilar atelectasis is noted. There is no evidence of pneumothorax. Upper Abdomen: No acute abnormality. Musculoskeletal: Diffuse  subcutaneous edema is present. No acute bony abnormalities are identified. Review of the MIP images confirms the above findings. IMPRESSION: 1. Bilateral segmental pulmonary emboli.  Critical Value/emergent results were called by telephone at the time of interpretation on 12/01/2022 at 1:13 pm to provider Joanette Gula , who verbally acknowledged these results. 2. Small to moderate RIGHT pleural effusion, diffuse ground-glass opacities and diffuse subcutaneous edema. 3. Cardiomegaly, small to moderate pericardial effusion and coronary artery disease. 4. Prominent main pulmonary artery again noted, compatible with pulmonary arterial hypertension. 5.  Aortic Atherosclerosis (ICD10-I70.0). Electronically Signed   By: Margarette Canada M.D.   On: 12/12/2022 13:14   DG Chest Port 1 View  Result Date: 11/25/2022 CLINICAL DATA:  47 year old female with chest pain, nausea, chills. Dialysis patient. EXAM: PORTABLE CHEST 1 VIEW COMPARISON:  Portable chest 11/07/2022 and earlier. FINDINGS: Portable AP upright view at 1013 hours. Stable somewhat low lung volumes. Stable cardiomegaly and mediastinal contours. Visualized tracheal air column is within normal limits. No pneumothorax, pleural effusion or consolidation. Pulmonary vascular congestion has not significantly changed, no overt edema. Inferior approach vascular catheter projecting over the right atrium is stable. No acute osseous abnormality identified. Negative visible bowel gas. IMPRESSION: No acute cardiopulmonary abnormality. Electronically Signed   By: Genevie Ann M.D.   On: 11/30/2022 10:27    Microbiology Recent Results (from the past 240 hour(s))  Resp panel by RT-PCR (RSV, Flu A&B, Covid) Anterior Nasal Swab     Status: None   Collection Time: 12/09/2022 11:02 AM   Specimen: Anterior Nasal Swab  Result Value Ref Range Status   SARS Coronavirus 2 by RT PCR NEGATIVE NEGATIVE Final    Comment: (NOTE) SARS-CoV-2 target nucleic acids are NOT DETECTED.  The SARS-CoV-2 RNA is generally detectable in upper respiratory specimens during the acute phase of infection. The lowest concentration of SARS-CoV-2 viral copies this assay can detect is 138  copies/mL. A negative result does not preclude SARS-Cov-2 infection and should not be used as the sole basis for treatment or other patient management decisions. A negative result may occur with  improper specimen collection/handling, submission of specimen other than nasopharyngeal swab, presence of viral mutation(s) within the areas targeted by this assay, and inadequate number of viral copies(<138 copies/mL). A negative result must be combined with clinical observations, patient history, and epidemiological information. The expected result is Negative.  Fact Sheet for Patients:  EntrepreneurPulse.com.au  Fact Sheet for Healthcare Providers:  IncredibleEmployment.be  This test is no t yet approved or cleared by the [place] FDA and  has been authorized for detection and/or diagnosis of SARS-CoV-2 by FDA under an Emergency Use Authorization (EUA). This EUA will remain  in effect (meaning this test can be used) for the duration of the COVID-19 declaration under Section 564(b)(1) of the Act, 21 U.S.C.section 360bbb-3(b)(1), unless the authorization is terminated  or revoked sooner.       Influenza A by PCR NEGATIVE NEGATIVE Final   Influenza B by PCR NEGATIVE NEGATIVE Final    Comment: (NOTE) The Xpert Xpress SARS-CoV-2/FLU/RSV plus assay is intended as an aid in the diagnosis of influenza from Nasopharyngeal swab specimens and should not be used as a sole basis for treatment. Nasal washings and aspirates are unacceptable for Xpert Xpress SARS-CoV-2/FLU/RSV testing.  Fact Sheet for Patients: EntrepreneurPulse.com.au  Fact Sheet for Healthcare Providers: IncredibleEmployment.be  This test is not yet approved or cleared by the [place] FDA and has been authorized for detection and/or diagnosis of SARS-CoV-2 by  FDA under an Emergency Use Authorization (EUA). This EUA will remain in effect (meaning  this test can be used) for the duration of the COVID-19 declaration under Section 564(b)(1) of the Act, 21 U.S.C. section 360bbb-3(b)(1), unless the authorization is terminated or revoked.     Resp Syncytial Virus by PCR NEGATIVE NEGATIVE Final    Comment: (NOTE) Fact Sheet for Patients: EntrepreneurPulse.com.au  Fact Sheet for Healthcare Providers: IncredibleEmployment.be  This test is not yet approved or cleared by the [place] FDA and has been authorized for detection and/or diagnosis of SARS-CoV-2 by FDA under an Emergency Use Authorization (EUA). This EUA will remain in effect (meaning this test can be used) for the duration of the COVID-19 declaration under Section 564(b)(1) of the Act, 21 U.S.C. section 360bbb-3(b)(1), unless the authorization is terminated or revoked.  Performed at [facility], [address], [place], [place] [address]   MRSA Next Gen by PCR, Nasal     Status: Abnormal   Collection Time: 01-15-2023 10:58 AM   Specimen: Nasal Mucosa; Nasal Swab  Result Value Ref Range Status   MRSA by PCR Next Gen DETECTED (A) NOT DETECTED Final    Comment: RESULT CALLED TO, READ BACK BY AND VERIFIED WITH:  Thereasa Distance, RN 15-Jan-2023 1304 A. LAFRANCE (NOTE) The GeneXpert MRSA Assay (FDA approved for NASAL specimens only), is one component of a comprehensive MRSA colonization surveillance program. It is not intended to diagnose MRSA infection nor to guide or monitor treatment for MRSA infections. Test performance is not FDA approved in patients less than 55 years old. Performed at [facility], [place] [address], [place], [place] [address]     Lab Basic Metabolic Panel: Recent Labs  Lab 2023/01/15 1801  NA 134*  K 4.5   Liver Function Tests: No results for input(s): "AST", "ALT", "ALKPHOS", "BILITOT", "PROT", "ALBUMIN" in the last 168 hours. No results for input(s): "LIPASE", "AMYLASE" in the last 168 hours. No results  for input(s): "AMMONIA" in the last 168 hours. CBC: Recent Labs  Lab 15-Jan-2023 1801  HGB 9.9*  HCT 29.0*   Cardiac Enzymes: No results for input(s): "CKTOTAL", "CKMB", "CKMBINDEX", "TROPONINI" in the last 168 hours. Sepsis Labs: Recent Labs  Lab 01/15/23 1813  LATICACIDVEN 3.7*    Candee Furbish 01/02/2023, 5:49 PM

## 2023-01-26 NOTE — Procedures (Signed)
Intubation Procedure Note  Rebekah Burton  [id number]  11-24-1976  Date:01-12-23  Time:9:43 AM   Provider Performing:Shannette Tabares C Tamala Julian    Procedure: Intubation (48592)  Indication(s) Respiratory Failure  Consent Unable to obtain consent due to emergent nature of procedure.   Anesthesia Etomidate and Rocuronium   Time Out Verified patient identification, verified procedure, site/side was marked, verified correct patient position, special equipment/implants available, medications/allergies/relevant history reviewed, required imaging and test results available.   Sterile Technique Usual hand hygeine, masks, and gloves were used   Procedure Description Patient positioned in bed supine.  Sedation given as noted above.  Patient was intubated with endotracheal tube using Glidescope.  View was Grade 1 full glottis .  Number of attempts was 1.  Colorimetric CO2 detector was consistent with tracheal placement.   Complications/Tolerance None; patient tolerated the procedure well. Chest X-ray is ordered to verify placement.   EBL Minimal   Specimen(s) None

## 2023-01-26 NOTE — Progress Notes (Signed)
Patient progressively more hypotensive. Maxed on vasopressors. Tamala Julian MD notified. Kennieth Rad NP to bedside. Family contacted and making plans for whether or not to come back to the hospital.

## 2023-01-26 NOTE — Progress Notes (Signed)
VASCULAR LAB    Right lower extremity venous duplex has been performed.  See CV proc for preliminary results.   Saba Gomm, RVT 01-24-2023, 4:22 PM

## 2023-01-26 NOTE — ED Notes (Signed)
Code stroke called. Pt is not following commands, has a right facial droop and a right gaze, and aphasia. Critical care is at bedside with plans to intubate.

## 2023-01-26 NOTE — Progress Notes (Signed)
  Echocardiogram 2D Echocardiogram has been performed.  Rebekah Burton 01/17/23, 12:33 PM

## 2023-01-26 NOTE — Progress Notes (Addendum)
ANTICOAGULATION CONSULT NOTE - Follow-up Note  Pharmacy Consult for Heparin Indication: chest pain/ACS >> PE   No Known Allergies  Patient Measurements: Height: 5\' 4"  (162.6 cm) Weight:  (Patient unable to stand, no scale avalible, patient on stretcher from emergency room without scale on bed) IBW/kg (Calculated) : 54.7 Heparin Dosing Weight: 71.8 kg  Vital Signs: Temp: 97.7 F (36.5 C) Jan 13, 2023 0407) Temp Source: Axillary 2023-01-13 0407) BP: 108/74 01/13/23 0700) Pulse Rate: 105 13-Jan-2023 0700)  Labs: Recent Labs    12/06/2022 1009 11/25/2022 1202 12/08/2022 1835 January 13, 2023 0527 January 13, 2023 0627  HGB 9.5*  --   --  8.9* 9.5*  HCT 30.5*  --   --  28.7* 28.0*  PLT 121*  --   --  103*  --   HEPARINUNFRC  --   --  <0.10*  --   --   CREATININE 10.59*  --   --  6.97*  --   TROPONINIHS 2,264* 2,120*  --   --   --     Estimated Creatinine Clearance: 10.5 mL/min (A) (by C-G formula based on SCr of 6.97 mg/dL (H)).   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[facility])    History of transmetatarsal amputation of right foot ([facility])    Hypertension    PAD (peripheral artery disease) ([facility])    Tobacco abuse     Medications:  (Not in a hospital admission)  Scheduled:   aspirin EC  81 mg Oral Daily   atorvastatin  80 mg Oral Daily   carvedilol  6.25 mg Oral BID WC   Chlorhexidine Gluconate Cloth  6 each Topical Q0600   sodium chloride flush  3 mL Intravenous Q12H   sodium chloride flush  3 mL Intravenous Q12H   Infusions:   sodium chloride     sodium chloride     heparin 900 Units/hr (12/15/2022 1950)   PRN: sodium chloride, sodium chloride, acetaminophen, ondansetron (ZOFRAN) IV, sodium chloride flush, sodium chloride flush  Assessment: 47 y.o. female with ESRD (M,W, F), hypertension, PAD presenting for chest pain. Chest  pain located in the center of her chest and radiates to her left arm. Not on oral anticoagulants. Pharmacy asked to start heparin. Patient had CTA today showing bilateral PE. Cardiology consulted for NSTEMI, recommending LHC.   Code stroke also activated for patient this AM following HD. No stroke intervention per neurology team.  Previously noted to have heparin level of <0.1 while on 900 unit/hr heparin. This was felt to be false level given heparin infusion had been stopped for unknown period of time prior to RN collecting level. Infusion continued at same rate. Minor bleeding from hand noted at that time as well that had stopped.  Per RN, no bleeding noted at this time and heparin has been infusing.  Hgb 9.5; plt 121>103  Goal of Therapy:  Heparin level 0.3-0.7 units/ml Monitor platelets by anticoagulation protocol: Yes   Plan:  Give IV heparin 1500 units bolus x 1 Increase heparin infusion to 1100 units/hr Check anti-Xa level at 1900 and daily while on heparin Continue to monitor H&H and platelets  Lorelei Pont, PharmD, BCPS Jan 13, 2023 7:23 AM ED Clinical Pharmacist -  [phone]

## 2023-01-26 DEATH — deceased

## 2023-03-02 MED FILL — Medication: Qty: 1 | Status: AC
